# Patient Record
Sex: Female | Born: 1945
Health system: Southern US, Community
[De-identification: ages and names within clinical notes are randomized; demographics above are authoritative.]

## PROBLEM LIST (undated history)

## (undated) DIAGNOSIS — F411 Generalized anxiety disorder: Secondary | ICD-10-CM

## (undated) DIAGNOSIS — R55 Syncope and collapse: Secondary | ICD-10-CM

## (undated) DIAGNOSIS — E663 Overweight: Secondary | ICD-10-CM

## (undated) DIAGNOSIS — N951 Menopausal and female climacteric states: Secondary | ICD-10-CM

## (undated) DIAGNOSIS — I447 Left bundle-branch block, unspecified: Secondary | ICD-10-CM

## (undated) DIAGNOSIS — E785 Hyperlipidemia, unspecified: Secondary | ICD-10-CM

## (undated) DIAGNOSIS — D126 Benign neoplasm of colon, unspecified: Secondary | ICD-10-CM

## (undated) DIAGNOSIS — Z950 Presence of cardiac pacemaker: Secondary | ICD-10-CM

## (undated) DIAGNOSIS — M1990 Unspecified osteoarthritis, unspecified site: Secondary | ICD-10-CM

## (undated) DIAGNOSIS — G4700 Insomnia, unspecified: Secondary | ICD-10-CM

## (undated) DIAGNOSIS — G20 Parkinson's disease: Secondary | ICD-10-CM

## (undated) DIAGNOSIS — I10 Essential (primary) hypertension: Secondary | ICD-10-CM

## (undated) DIAGNOSIS — G20A1 Parkinson's disease without dyskinesia, without mention of fluctuations: Secondary | ICD-10-CM

## (undated) HISTORY — DX: Generalized anxiety disorder: F41.1

## (undated) HISTORY — DX: Syncope and collapse: R55

## (undated) HISTORY — DX: Left bundle-branch block, unspecified: I44.7

## (undated) HISTORY — DX: Essential (primary) hypertension: I10

## (undated) HISTORY — DX: Parkinson's disease without dyskinesia, without mention of fluctuations: G20.A1

## (undated) HISTORY — PX: POLYPECTOMY: SHX149

## (undated) HISTORY — DX: Insomnia, unspecified: G47.00

## (undated) HISTORY — DX: Hyperlipidemia, unspecified: E78.5

## (undated) HISTORY — DX: Parkinson's disease: G20

## (undated) HISTORY — DX: Benign neoplasm of colon, unspecified: D12.6

## (undated) HISTORY — DX: Menopausal and female climacteric states: N95.1

## (undated) HISTORY — DX: Unspecified osteoarthritis, unspecified site: M19.90

## (undated) HISTORY — DX: Overweight: E66.3

---

## 1998-02-19 ENCOUNTER — Ambulatory Visit (HOSPITAL_COMMUNITY): Admission: RE | Admit: 1998-02-19 | Discharge: 1998-02-19 | Payer: Self-pay | Admitting: Obstetrics and Gynecology

## 1998-02-19 ENCOUNTER — Encounter: Payer: Self-pay | Admitting: Obstetrics and Gynecology

## 1998-07-10 ENCOUNTER — Other Ambulatory Visit: Admission: RE | Admit: 1998-07-10 | Discharge: 1998-07-10 | Payer: Self-pay | Admitting: Obstetrics and Gynecology

## 1999-03-06 ENCOUNTER — Encounter: Payer: Self-pay | Admitting: Obstetrics and Gynecology

## 1999-03-06 ENCOUNTER — Ambulatory Visit (HOSPITAL_COMMUNITY): Admission: RE | Admit: 1999-03-06 | Discharge: 1999-03-06 | Payer: Self-pay | Admitting: Obstetrics and Gynecology

## 1999-07-11 ENCOUNTER — Other Ambulatory Visit: Admission: RE | Admit: 1999-07-11 | Discharge: 1999-07-11 | Payer: Self-pay | Admitting: Obstetrics and Gynecology

## 1999-10-23 ENCOUNTER — Encounter (INDEPENDENT_AMBULATORY_CARE_PROVIDER_SITE_OTHER): Payer: Self-pay | Admitting: Specialist

## 1999-10-23 ENCOUNTER — Other Ambulatory Visit: Admission: RE | Admit: 1999-10-23 | Discharge: 1999-10-23 | Payer: Self-pay | Admitting: *Deleted

## 1999-12-05 ENCOUNTER — Ambulatory Visit (HOSPITAL_COMMUNITY): Admission: RE | Admit: 1999-12-05 | Discharge: 1999-12-05 | Payer: Self-pay | Admitting: Obstetrics and Gynecology

## 1999-12-05 ENCOUNTER — Encounter (INDEPENDENT_AMBULATORY_CARE_PROVIDER_SITE_OTHER): Payer: Self-pay | Admitting: Specialist

## 2000-04-13 ENCOUNTER — Encounter: Payer: Self-pay | Admitting: Obstetrics and Gynecology

## 2000-04-13 ENCOUNTER — Ambulatory Visit (HOSPITAL_COMMUNITY): Admission: RE | Admit: 2000-04-13 | Discharge: 2000-04-13 | Payer: Self-pay | Admitting: Obstetrics and Gynecology

## 2000-06-07 ENCOUNTER — Ambulatory Visit (HOSPITAL_COMMUNITY): Admission: RE | Admit: 2000-06-07 | Discharge: 2000-06-07 | Payer: Self-pay | Admitting: Cardiology

## 2000-07-15 ENCOUNTER — Other Ambulatory Visit: Admission: RE | Admit: 2000-07-15 | Discharge: 2000-07-15 | Payer: Self-pay | Admitting: Obstetrics and Gynecology

## 2001-07-20 ENCOUNTER — Other Ambulatory Visit: Admission: RE | Admit: 2001-07-20 | Discharge: 2001-07-20 | Payer: Self-pay | Admitting: Obstetrics and Gynecology

## 2001-08-02 ENCOUNTER — Encounter: Payer: Self-pay | Admitting: Internal Medicine

## 2001-08-02 ENCOUNTER — Ambulatory Visit (HOSPITAL_COMMUNITY): Admission: RE | Admit: 2001-08-02 | Discharge: 2001-08-02 | Payer: Self-pay | Admitting: Internal Medicine

## 2002-07-04 ENCOUNTER — Other Ambulatory Visit: Admission: RE | Admit: 2002-07-04 | Discharge: 2002-07-04 | Payer: Self-pay | Admitting: Obstetrics and Gynecology

## 2002-08-23 ENCOUNTER — Ambulatory Visit (HOSPITAL_COMMUNITY): Admission: RE | Admit: 2002-08-23 | Discharge: 2002-08-23 | Payer: Self-pay | Admitting: Obstetrics and Gynecology

## 2002-08-23 ENCOUNTER — Encounter: Payer: Self-pay | Admitting: Obstetrics and Gynecology

## 2003-02-27 ENCOUNTER — Other Ambulatory Visit: Admission: RE | Admit: 2003-02-27 | Discharge: 2003-02-27 | Payer: Self-pay | Admitting: Family Medicine

## 2003-07-11 ENCOUNTER — Other Ambulatory Visit: Admission: RE | Admit: 2003-07-11 | Discharge: 2003-07-11 | Payer: Self-pay | Admitting: Obstetrics and Gynecology

## 2003-08-21 ENCOUNTER — Ambulatory Visit (HOSPITAL_COMMUNITY): Admission: RE | Admit: 2003-08-21 | Discharge: 2003-08-21 | Payer: Self-pay | Admitting: Obstetrics and Gynecology

## 2004-08-26 ENCOUNTER — Ambulatory Visit (HOSPITAL_COMMUNITY): Admission: RE | Admit: 2004-08-26 | Discharge: 2004-08-26 | Payer: Self-pay | Admitting: Obstetrics and Gynecology

## 2004-09-12 ENCOUNTER — Encounter: Payer: Self-pay | Admitting: Internal Medicine

## 2004-09-12 LAB — CONVERTED CEMR LAB

## 2005-03-16 DIAGNOSIS — D126 Benign neoplasm of colon, unspecified: Secondary | ICD-10-CM

## 2005-03-16 HISTORY — DX: Benign neoplasm of colon, unspecified: D12.6

## 2005-03-16 HISTORY — PX: COLONOSCOPY: SHX174

## 2005-07-22 ENCOUNTER — Ambulatory Visit: Payer: Self-pay | Admitting: Internal Medicine

## 2005-08-31 ENCOUNTER — Ambulatory Visit (HOSPITAL_COMMUNITY): Admission: RE | Admit: 2005-08-31 | Discharge: 2005-08-31 | Payer: Self-pay | Admitting: Internal Medicine

## 2005-09-10 ENCOUNTER — Ambulatory Visit: Payer: Self-pay | Admitting: Internal Medicine

## 2005-09-10 ENCOUNTER — Ambulatory Visit (HOSPITAL_COMMUNITY): Admission: RE | Admit: 2005-09-10 | Discharge: 2005-09-10 | Payer: Self-pay | Admitting: Internal Medicine

## 2005-09-11 ENCOUNTER — Encounter: Admission: RE | Admit: 2005-09-11 | Discharge: 2005-09-11 | Payer: Self-pay | Admitting: Internal Medicine

## 2005-09-21 ENCOUNTER — Ambulatory Visit: Payer: Self-pay | Admitting: Internal Medicine

## 2005-10-07 ENCOUNTER — Ambulatory Visit: Payer: Self-pay | Admitting: Internal Medicine

## 2005-11-06 ENCOUNTER — Ambulatory Visit: Payer: Self-pay | Admitting: Internal Medicine

## 2005-11-20 ENCOUNTER — Ambulatory Visit: Payer: Self-pay | Admitting: Gastroenterology

## 2005-12-04 ENCOUNTER — Ambulatory Visit: Payer: Self-pay | Admitting: Internal Medicine

## 2006-01-01 ENCOUNTER — Encounter (INDEPENDENT_AMBULATORY_CARE_PROVIDER_SITE_OTHER): Payer: Self-pay | Admitting: *Deleted

## 2006-01-01 ENCOUNTER — Ambulatory Visit: Payer: Self-pay | Admitting: Gastroenterology

## 2006-01-29 ENCOUNTER — Ambulatory Visit: Payer: Self-pay | Admitting: Internal Medicine

## 2006-04-30 ENCOUNTER — Ambulatory Visit: Payer: Self-pay | Admitting: Internal Medicine

## 2006-05-11 ENCOUNTER — Ambulatory Visit: Payer: Self-pay

## 2006-09-01 ENCOUNTER — Ambulatory Visit: Payer: Self-pay | Admitting: Internal Medicine

## 2006-09-01 LAB — CONVERTED CEMR LAB
Folate: 16.8 ng/mL
Vitamin B-12: 442 pg/mL (ref 211–911)

## 2006-09-03 ENCOUNTER — Ambulatory Visit: Payer: Self-pay | Admitting: Internal Medicine

## 2006-10-12 ENCOUNTER — Encounter: Payer: Self-pay | Admitting: Internal Medicine

## 2006-10-12 DIAGNOSIS — F329 Major depressive disorder, single episode, unspecified: Secondary | ICD-10-CM | POA: Insufficient documentation

## 2006-10-12 DIAGNOSIS — F419 Anxiety disorder, unspecified: Secondary | ICD-10-CM | POA: Insufficient documentation

## 2006-10-12 DIAGNOSIS — M1990 Unspecified osteoarthritis, unspecified site: Secondary | ICD-10-CM | POA: Insufficient documentation

## 2007-01-05 ENCOUNTER — Telehealth: Payer: Self-pay | Admitting: Internal Medicine

## 2007-01-14 ENCOUNTER — Encounter: Payer: Self-pay | Admitting: Internal Medicine

## 2007-01-14 ENCOUNTER — Ambulatory Visit (HOSPITAL_COMMUNITY): Admission: RE | Admit: 2007-01-14 | Discharge: 2007-01-14 | Payer: Self-pay | Admitting: Internal Medicine

## 2007-01-28 ENCOUNTER — Ambulatory Visit: Payer: Self-pay | Admitting: Internal Medicine

## 2007-02-01 LAB — CONVERTED CEMR LAB
AST: 28 units/L (ref 0–37)
Albumin: 3.6 g/dL (ref 3.5–5.2)
Bilirubin Urine: NEGATIVE
Crystals: NEGATIVE
Eosinophils Relative: 1.9 % (ref 0.0–5.0)
GFR calc Af Amer: 109 mL/min
GFR calc non Af Amer: 90 mL/min
HCT: 41.1 % (ref 36.0–46.0)
HDL: 115.3 mg/dL (ref >39.0)
Hemoglobin: 14.1 g/dL (ref 12.0–15.0)
Lymphocytes Relative: 32.5 % (ref 12.0–46.0)
Monocytes Relative: 6.8 % (ref 3.0–11.0)
Potassium: 3.8 meq/L (ref 3.5–5.1)
RBC: 4.77 M/uL (ref 3.87–5.11)
Specific Gravity, Urine: 1.015 (ref 1.000–1.03)
Total Bilirubin: 0.8 mg/dL (ref 0.3–1.2)
Total Protein: 7 g/dL (ref 6.0–8.3)
Triglycerides: 43 mg/dL (ref 0–149)
Urine Glucose: NEGATIVE mg/dL
Urobilinogen, UA: 0.2 (ref 0.0–1.0)

## 2007-02-04 ENCOUNTER — Ambulatory Visit: Payer: Self-pay | Admitting: Internal Medicine

## 2007-02-04 DIAGNOSIS — M899 Disorder of bone, unspecified: Secondary | ICD-10-CM

## 2007-02-04 DIAGNOSIS — M949 Disorder of cartilage, unspecified: Secondary | ICD-10-CM

## 2007-02-04 DIAGNOSIS — N951 Menopausal and female climacteric states: Secondary | ICD-10-CM | POA: Insufficient documentation

## 2007-02-04 DIAGNOSIS — E785 Hyperlipidemia, unspecified: Secondary | ICD-10-CM | POA: Insufficient documentation

## 2007-02-07 ENCOUNTER — Encounter: Payer: Self-pay | Admitting: Internal Medicine

## 2007-02-07 LAB — CONVERTED CEMR LAB: CRP, High Sensitivity: 5 (ref 0.00–5.00)

## 2007-03-22 ENCOUNTER — Ambulatory Visit: Payer: Self-pay | Admitting: Internal Medicine

## 2007-03-23 LAB — CONVERTED CEMR LAB
Direct LDL: 69.3 mg/dL
HDL: 119.4 mg/dL (ref >39.0)
Total CHOL/HDL Ratio: 1.8
Total CHOL/HDL Ratio: 1.8
Triglycerides: 49 mg/dL (ref 0–149)
VLDL: 10 mg/dL (ref 0–40)
VLDL: 10 mg/dL (ref 0–40)

## 2007-03-30 ENCOUNTER — Ambulatory Visit: Payer: Self-pay | Admitting: Internal Medicine

## 2007-04-13 ENCOUNTER — Encounter: Payer: Self-pay | Admitting: Internal Medicine

## 2007-07-11 ENCOUNTER — Encounter (INDEPENDENT_AMBULATORY_CARE_PROVIDER_SITE_OTHER): Payer: Self-pay | Admitting: *Deleted

## 2007-09-05 ENCOUNTER — Encounter: Payer: Self-pay | Admitting: Internal Medicine

## 2007-09-13 ENCOUNTER — Telehealth (INDEPENDENT_AMBULATORY_CARE_PROVIDER_SITE_OTHER): Payer: Self-pay | Admitting: *Deleted

## 2007-10-12 ENCOUNTER — Ambulatory Visit: Payer: Self-pay | Admitting: Internal Medicine

## 2007-11-22 ENCOUNTER — Telehealth: Payer: Self-pay | Admitting: Internal Medicine

## 2007-11-30 ENCOUNTER — Ambulatory Visit: Payer: Self-pay | Admitting: Psychology

## 2007-12-12 ENCOUNTER — Telehealth: Payer: Self-pay | Admitting: Internal Medicine

## 2007-12-16 ENCOUNTER — Ambulatory Visit: Payer: Self-pay | Admitting: Psychology

## 2007-12-19 ENCOUNTER — Telehealth: Payer: Self-pay | Admitting: Internal Medicine

## 2007-12-27 ENCOUNTER — Ambulatory Visit: Payer: Self-pay | Admitting: Psychology

## 2007-12-30 ENCOUNTER — Ambulatory Visit: Payer: Self-pay | Admitting: Internal Medicine

## 2007-12-30 LAB — CONVERTED CEMR LAB
AST: 24 units/L (ref 0–37)
Albumin: 3.5 g/dL (ref 3.5–5.2)
Alkaline Phosphatase: 65 units/L (ref 39–117)
TSH: 1.85 microintl units/mL (ref 0.35–5.50)
Total Bilirubin: 0.7 mg/dL (ref 0.3–1.2)
Total CHOL/HDL Ratio: 1.8
VLDL: 7 mg/dL (ref 0–40)

## 2008-01-12 ENCOUNTER — Ambulatory Visit: Payer: Self-pay | Admitting: Internal Medicine

## 2008-01-12 DIAGNOSIS — K620 Anal polyp: Secondary | ICD-10-CM | POA: Insufficient documentation

## 2008-01-12 DIAGNOSIS — G4700 Insomnia, unspecified: Secondary | ICD-10-CM | POA: Insufficient documentation

## 2008-01-12 DIAGNOSIS — K621 Rectal polyp: Secondary | ICD-10-CM

## 2008-01-24 ENCOUNTER — Telehealth: Payer: Self-pay | Admitting: Gastroenterology

## 2008-01-27 ENCOUNTER — Ambulatory Visit: Payer: Self-pay | Admitting: Internal Medicine

## 2008-01-27 ENCOUNTER — Ambulatory Visit: Payer: Self-pay | Admitting: Psychology

## 2008-01-27 ENCOUNTER — Encounter: Payer: Self-pay | Admitting: Internal Medicine

## 2008-02-20 ENCOUNTER — Telehealth: Payer: Self-pay | Admitting: Internal Medicine

## 2008-02-24 ENCOUNTER — Ambulatory Visit (HOSPITAL_COMMUNITY): Admission: RE | Admit: 2008-02-24 | Discharge: 2008-02-24 | Payer: Self-pay | Admitting: Internal Medicine

## 2008-02-24 ENCOUNTER — Ambulatory Visit: Payer: Self-pay | Admitting: Psychology

## 2008-02-24 IMAGING — MG MM DIGITAL SCREENING BILAT
5 series · 5 of 5 positions shown · non-contrast
Comparison: none

DG SCREEN MAMMOGRAM BILATERAL
Bilateral CC and MLO view(s) were taken.

DIGITAL SCREENING MAMMOGRAM WITH CAD:
The breast tissue is heterogeneously dense.  No masses or malignant type calcifications are 
identified.  Compared with prior studies.

[R CC]
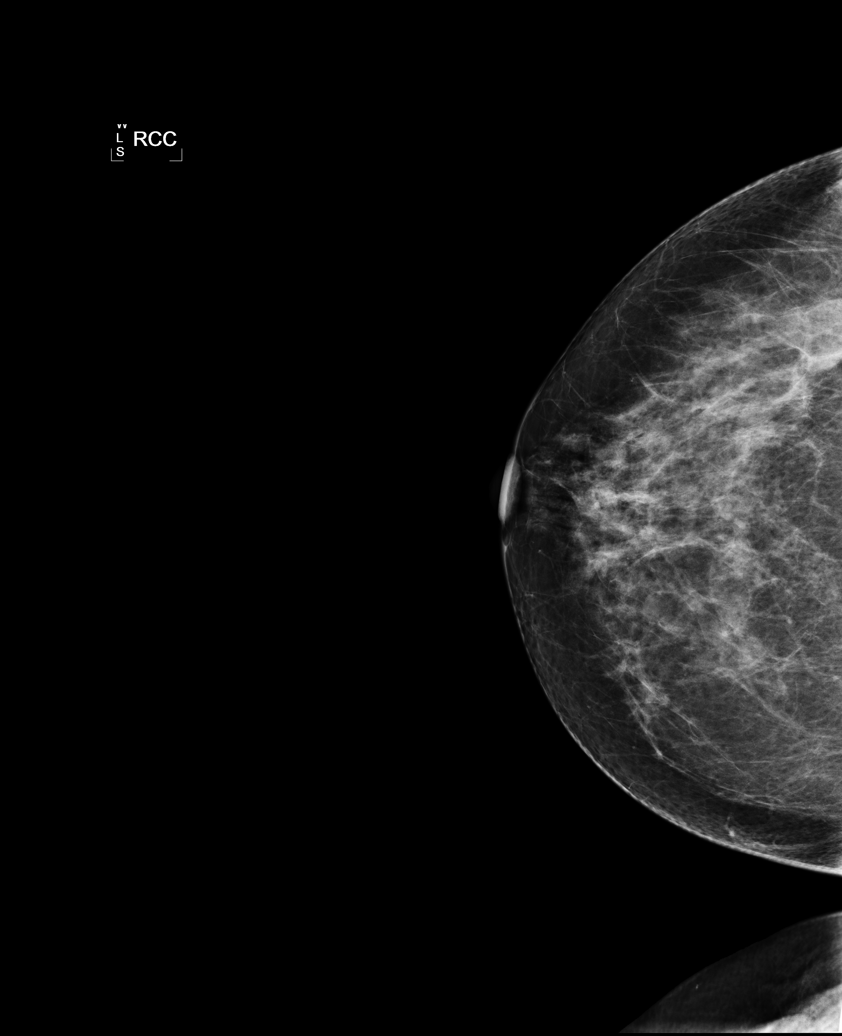

[R MLO (1 of 2)]
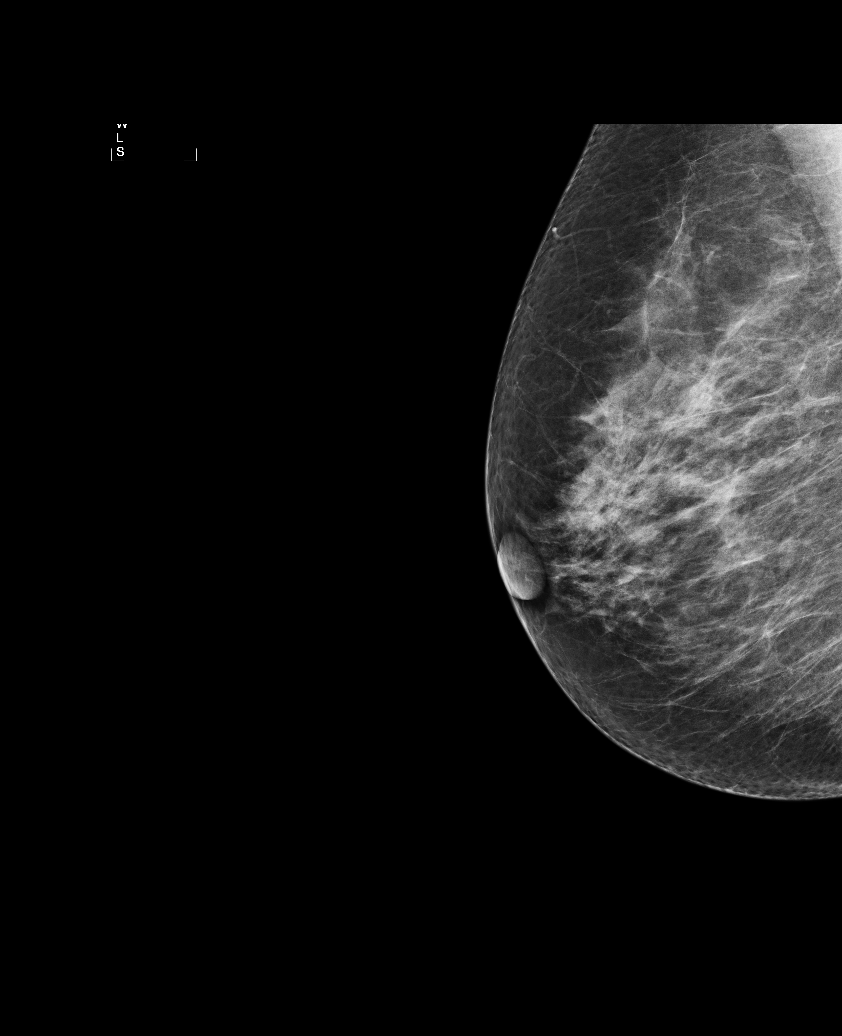

[L CC]
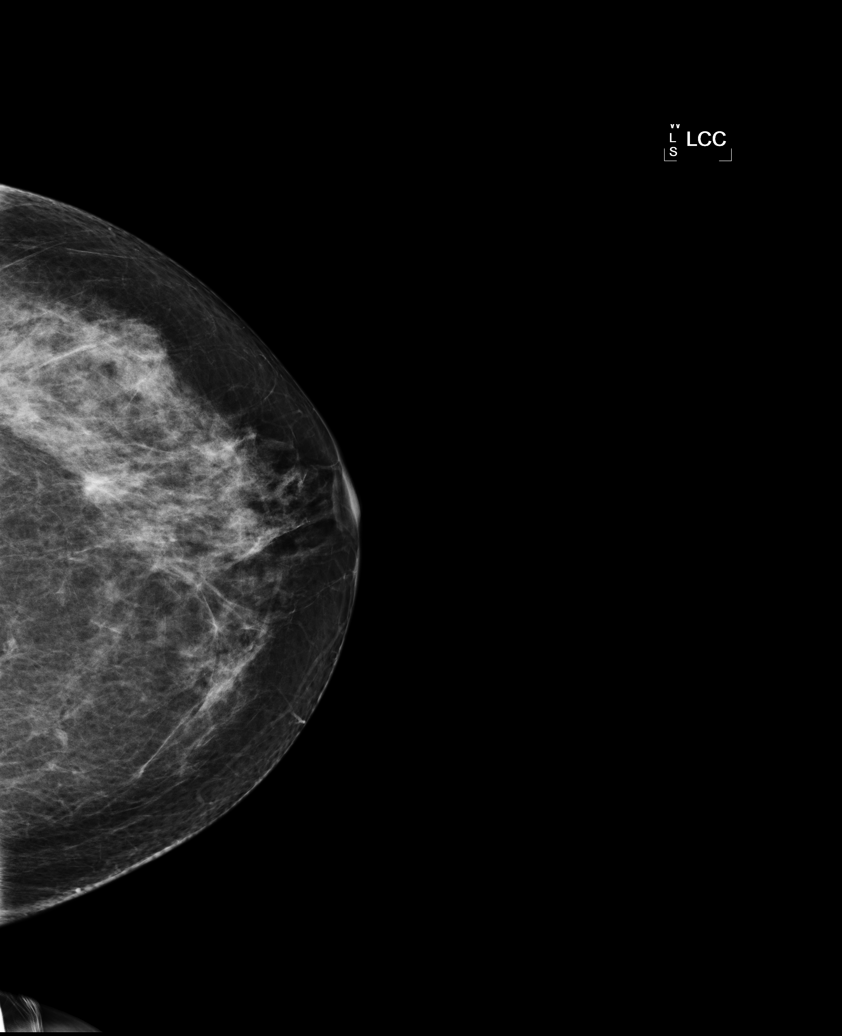

[L MLO]
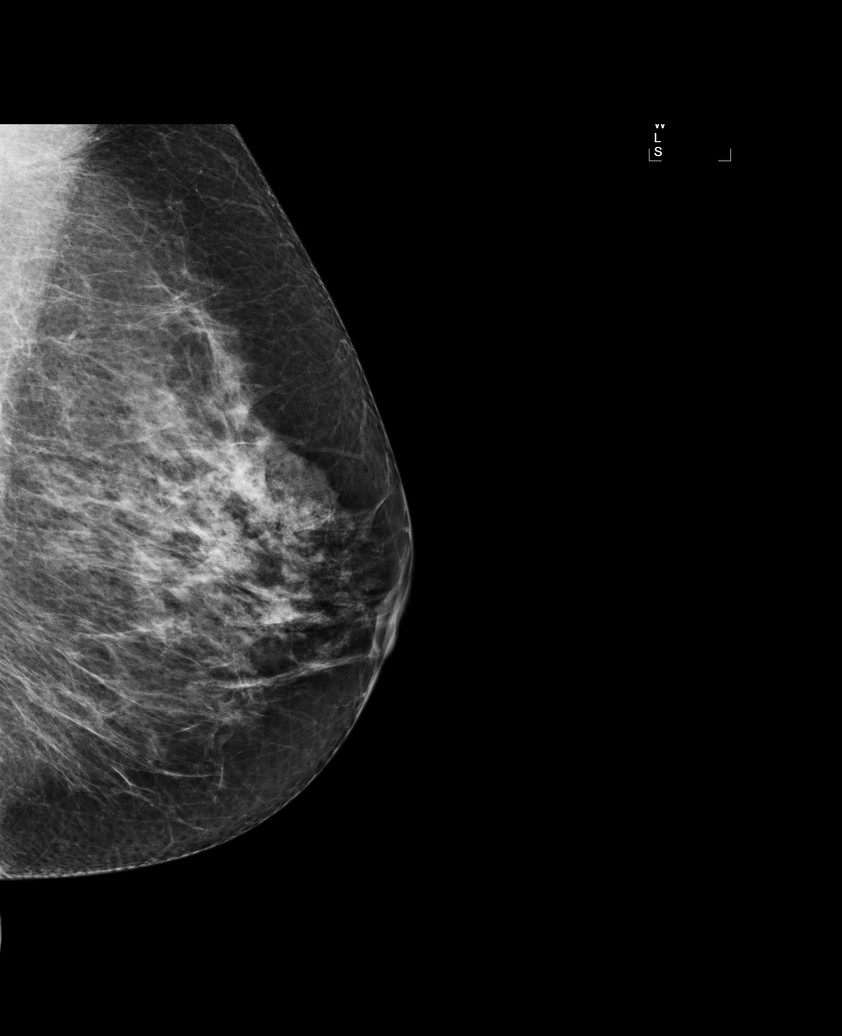

[R MLO (2 of 2)]
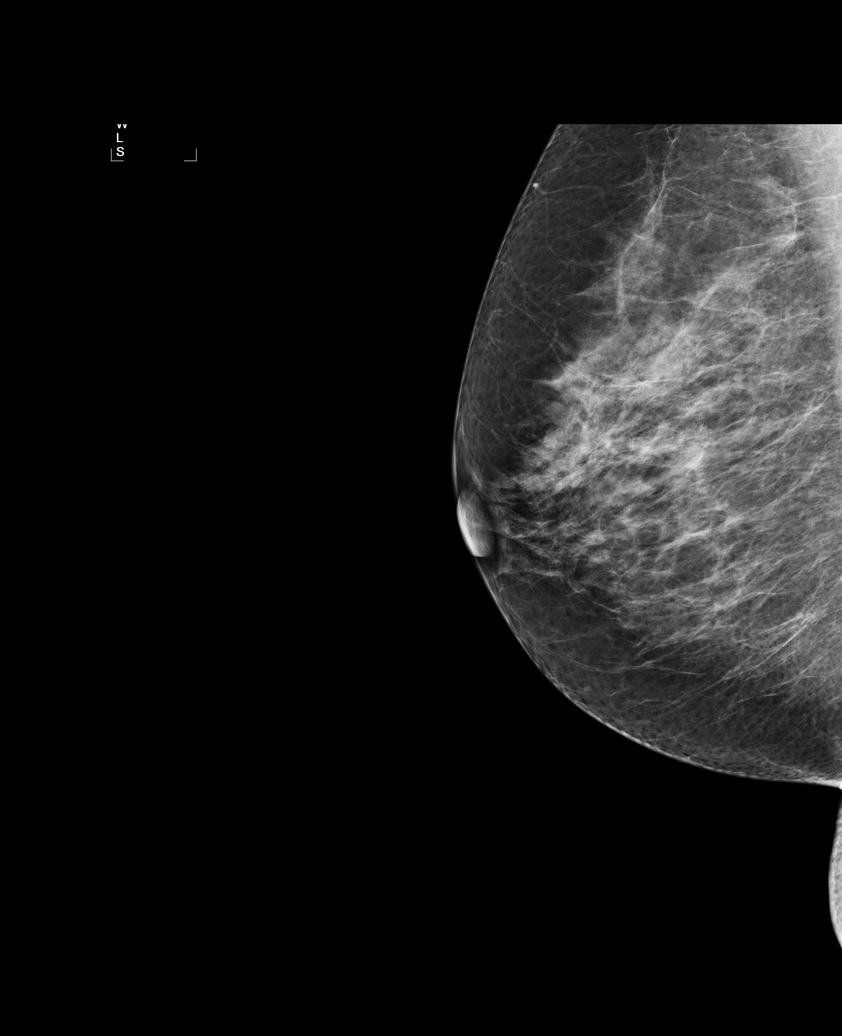

[5 of 5 positions shown; findings below may reference images not displayed]

IMPRESSION: No specific mammographic evidence of malignancy.  Next screening mammogram is recommended in one 
year.

ASSESSMENT: Negative - BI-RADS 1

Screening mammogram in 1 year.
ANALYZED BY COMPUTER AIDED DETECTION. , THIS PROCEDURE WAS A DIGITAL MAMMOGRAM.

## 2008-03-23 ENCOUNTER — Ambulatory Visit: Payer: Self-pay | Admitting: Psychology

## 2008-04-06 ENCOUNTER — Ambulatory Visit: Payer: Self-pay | Admitting: Psychology

## 2008-05-04 ENCOUNTER — Ambulatory Visit: Payer: Self-pay | Admitting: Psychology

## 2008-05-07 ENCOUNTER — Telehealth: Payer: Self-pay | Admitting: Internal Medicine

## 2008-05-09 ENCOUNTER — Telehealth: Payer: Self-pay | Admitting: Internal Medicine

## 2008-06-01 ENCOUNTER — Ambulatory Visit: Payer: Self-pay | Admitting: Psychology

## 2008-07-12 ENCOUNTER — Encounter: Payer: Self-pay | Admitting: Internal Medicine

## 2008-10-12 ENCOUNTER — Ambulatory Visit: Payer: Self-pay | Admitting: Psychology

## 2008-11-16 ENCOUNTER — Ambulatory Visit: Payer: Self-pay | Admitting: Psychology

## 2008-11-26 ENCOUNTER — Telehealth: Payer: Self-pay | Admitting: Internal Medicine

## 2008-12-11 ENCOUNTER — Encounter: Payer: Self-pay | Admitting: Internal Medicine

## 2008-12-14 ENCOUNTER — Ambulatory Visit: Payer: Self-pay | Admitting: Psychology

## 2009-04-11 ENCOUNTER — Ambulatory Visit (HOSPITAL_COMMUNITY): Admission: RE | Admit: 2009-04-11 | Discharge: 2009-04-11 | Payer: Self-pay | Admitting: Internal Medicine

## 2009-04-17 ENCOUNTER — Encounter: Admission: RE | Admit: 2009-04-17 | Discharge: 2009-04-17 | Payer: Self-pay | Admitting: Internal Medicine

## 2009-04-17 LAB — HM MAMMOGRAPHY: HM Mammogram: NEGATIVE

## 2009-04-18 ENCOUNTER — Encounter: Payer: Self-pay | Admitting: Internal Medicine

## 2009-07-11 ENCOUNTER — Ambulatory Visit: Payer: Self-pay | Admitting: Internal Medicine

## 2009-07-11 LAB — CONVERTED CEMR LAB
ALT: 17 units/L (ref 0–35)
Albumin: 3.5 g/dL (ref 3.5–5.2)
BUN: 12 mg/dL (ref 6–23)
Bilirubin, Direct: 0.1 mg/dL (ref 0.0–0.3)
CO2: 30 meq/L (ref 19–32)
Calcium: 8.9 mg/dL (ref 8.4–10.5)
Cholesterol: 293 mg/dL — ABNORMAL HIGH (ref 0–200)
Eosinophils Absolute: 0.2 10*3/uL (ref 0.0–0.7)
GFR calc non Af Amer: 89.32 mL/min (ref >60)
HCT: 39.1 % (ref 36.0–46.0)
HDL: 107 mg/dL (ref >39.00)
Lymphs Abs: 1.5 10*3/uL (ref 0.7–4.0)
Monocytes Relative: 7.9 % (ref 3.0–12.0)
Neutro Abs: 2.6 10*3/uL (ref 1.4–7.7)
Neutrophils Relative %: 55.4 % (ref 43.0–77.0)
Potassium: 4.4 meq/L (ref 3.5–5.1)
RBC: 4.47 M/uL (ref 3.87–5.11)
Total Bilirubin: 0.6 mg/dL (ref 0.3–1.2)
VLDL: 11.6 mg/dL (ref 0.0–40.0)
WBC: 4.6 10*3/uL (ref 4.5–10.5)

## 2009-07-18 ENCOUNTER — Encounter (INDEPENDENT_AMBULATORY_CARE_PROVIDER_SITE_OTHER): Payer: Self-pay | Admitting: *Deleted

## 2009-07-18 ENCOUNTER — Ambulatory Visit: Payer: Self-pay | Admitting: Internal Medicine

## 2009-08-19 ENCOUNTER — Telehealth: Payer: Self-pay | Admitting: Internal Medicine

## 2009-09-27 ENCOUNTER — Encounter (INDEPENDENT_AMBULATORY_CARE_PROVIDER_SITE_OTHER): Payer: Self-pay | Admitting: *Deleted

## 2009-12-24 ENCOUNTER — Telehealth: Payer: Self-pay | Admitting: Internal Medicine

## 2010-01-21 ENCOUNTER — Telehealth: Payer: Self-pay | Admitting: Internal Medicine

## 2010-02-14 ENCOUNTER — Encounter: Payer: Self-pay | Admitting: Internal Medicine

## 2010-04-03 ENCOUNTER — Other Ambulatory Visit: Payer: Self-pay | Admitting: Internal Medicine

## 2010-04-03 DIAGNOSIS — Z1231 Encounter for screening mammogram for malignant neoplasm of breast: Secondary | ICD-10-CM

## 2010-04-03 DIAGNOSIS — Z0000 Encounter for general adult medical examination without abnormal findings: Secondary | ICD-10-CM

## 2010-04-06 ENCOUNTER — Encounter: Payer: Self-pay | Admitting: Obstetrics and Gynecology

## 2010-04-15 NOTE — Progress Notes (Signed)
Summary: REQ INFO FROM [facility]   Phone Note Call from Patient Call back at Work Phone [phone]   Summary of Call: Patient is requesting information from last office visit in may for insurance paperwork.  Initial call taken by: Lamar Sprinkles, CMA,  December 24, 2009 11:44 AM  Follow-up for Phone Call        Gave pt verbal info - vitals from last office visit given Follow-up by: Lamar Sprinkles, CMA,  December 25, 2009 2:07 PM

## 2010-04-15 NOTE — Letter (Signed)
Summary: [facility] Referral (unable to schedule) Notification  [facility]  [address] [place], [place] [address]   Phone: [phone]  Fax: [phone]      September 27, 2009 Mandy Durham 02-07-1946 MRN: [id number]   Mandy Durham [address] [place], [place]  [address]   Dear Dr. Debby Bud:   Thank you for your kind referral of the above patient. We have attempted to schedule the recommended COLONOSCOPY but have been unable to schedule because:  _X_ The patient was not available by phone and/or has not returned our calls.  __ The patient declined to schedule the procedure at this time.  We appreciate the referral and hope that we will have the opportunity to treat this patient in the future.    Sincerely,   [facility]  Vania Rea. Jarold Motto M.D. Hedwig Morton. Juanda Chance M.D. Venita Lick. Russella Dar M.D. Wilhemina Bonito. Marina Goodell M.D. Barbette Hair. Arlyce Dice M.D. Iva Boop M.D. Cheron Every.D.

## 2010-04-15 NOTE — Progress Notes (Signed)
  Phone Note Refill Request Message from:  Fax from Pharmacy on January 21, 2010 8:22 AM  Refills Requested: Medication #1:  ZOLPIDEM TARTRATE 10 MG  TABS Take 1/2  tab by mouth at bedtime Please Advise refills  Initial call taken by: Ami Bullins CMA,  January 21, 2010 8:22 AM  Follow-up for Phone Call        k x 5 Follow-up by: Jacques Navy MD,  January 21, 2010 1:17 PM    Prescriptions: ZOLPIDEM TARTRATE 10 MG  TABS (ZOLPIDEM TARTRATE) Take 1/2  tab by mouth at bedtime  #15 x 5   Entered by:   Ami Bullins CMA   Authorized by:   Jacques Navy MD   Signed by:   Bill Salinas CMA on 01/21/2010   Method used:   Telephoned to ...       [facility]  [address]* (retail)       [address]       [place], [place]  [address]       Ph: [phone] or [phone]       Fax: [phone]   RxID:   [id number]

## 2010-04-15 NOTE — Letter (Signed)
Summary: [facility] Consult Scheduled Letter  [facility]  [address] [place], [place] [address]   Phone: [phone]  Fax: [phone]      07/18/2009 MRN: [id number]  [facility] [address] [place], [place]  [address]    Dear Ms. Andrey Campanile,      We have scheduled an appointment for you.  At the recommendation of Dr.Norins, we have scheduled you a consult with [facility](2D-ECHO)on May 26.2011 TUE at 11:30am.Their phone number is [phone].If this appointment day and time is not convenient for you, please feel free to call the office of the doctor you are being referred to at the number listed above and reschedule the appointment.  [facility] [address] [place] ,[place] [address]    Thank you,  Patient Care Coordinator [facility]

## 2010-04-15 NOTE — Progress Notes (Signed)
Summary: REQUEST FOR REFILL   Phone Note Refill Request Message from:  Pharmacy  Refills Requested: Medication #1:  ZOLPIDEM TARTRATE 10 MG  TABS Take 1/2  tab by mouth at bedtime Initial call taken by: Lamar Sprinkles, CMA,  Jecenia  6, 2011 11:04 AM  Follow-up for Phone Call        see phone note 6/3 Follow-up by: Etta Grandchild MD,  Danasha  6, 2011 12:49 PM  Additional Follow-up for Phone Call Additional follow up Details #1::        No phone note, only refill request from pharm, pt had office visit 07/2009, CPX. Is this ok to fill?  Additional Follow-up by: Lamar Sprinkles, CMA,  Chaela  6, 2011 1:36 PM    Additional Follow-up for Phone Call Additional follow up Details #2::    yes Follow-up by: Etta Grandchild MD,  Ikran  6, 2011 5:13 PM  Prescriptions: ZOLPIDEM TARTRATE 10 MG  TABS (ZOLPIDEM TARTRATE) Take 1/2  tab by mouth at bedtime  #15 x 5   Entered by:   Lamar Sprinkles, CMA   Authorized by:   Jacques Navy MD   Signed by:   Lamar Sprinkles, CMA on 08/19/2009   Method used:   Telephoned to ...       [facility]  [address]* (retail)       [address]       [place], [place]  [address]       Ph: [phone] or [phone]       Fax: [phone]   RxID:   [id number]

## 2010-04-15 NOTE — Assessment & Plan Note (Signed)
Summary: CPX/ MED REFILL/NWS  #   Vital Signs:  Patient profile:   65 year old female Height:      65.5 inches Weight:      147 pounds BMI:     24.18 O2 Sat:      94 % on Room air Temp:     97.6 degrees F oral Pulse rate:   83 / minute BP sitting:   118 / 76  (left arm) Cuff size:   regular  Vitals Entered By: Bill Salinas CMA (Jul 18, 2009 1:27 PM)  O2 Flow:  Room air CC: pt here for yearly cpx, pt needs refills on citalopram, zolpidem, provera, estradial/ ab  Vision Screening:      Vision Comments: Last eye exam 2008 with Dr Almon Register. No changes   Primary Care Provider:  Gionna Polak  CC:  pt here for yearly cpx, pt needs refills on citalopram, zolpidem, provera, and estradial/ ab.  History of Present Illness: The patient reports a change in Bowel Habit from a daily habit to an irregular habit, sometimes going 2-3 days without a BM. In addition her she reports that the stool is more odfieroius. She cannot tell if the appearance has changed or if there is increased fat content.   She has started a once a day vitamin due to fatigue and malaise. Reviewed labs: no metabolic derangment, thyroid normal.   Lipids - reviewed chart back to '08 and the intiation of medical therapy after an episode of chest pain in Nov '08. She had a good response to crestor and then switched, for cost reasons, to lovastatin 40mg  qPM. For some time she has inadvertantly been off medication.   She has noticed increased to shortness of breath with exertion over the past 4 month. She has had no other symptoms - no chest pain. she has not gained a lot of weight or really changed her level of activity. Cardiac Risk - post-menopausal, age, hyperlipidemia.   She will have leg pain with walking:pain will start within 5-10 minutes. Pain will resolve quickly with rest. She denies a sense of leaden legs. No skin breakdown. No risk factors: non-smoker, not obese, no prolonged periods of inactivity.   She has seen her  gynecologist - normal examination. She has had a normal mammogram.   Current Medications (verified): 1)  Citalopram Hydrobromide 20 Mg  Tabs (Citalopram Hydrobromide) .... Take 1 Tablet By Mouth Once A Day 2)  Zolpidem Tartrate 10 Mg  Tabs (Zolpidem Tartrate) .... Take 1/2  Tab By Mouth At Bedtime 3)  Caltrate 600+d 600-400 Mg-Unit  Tabs (Calcium Carbonate-Vitamin D) .... Take 1 Tablet By Mouth Three Times A Day 4)  Lovastatin 20 Mg  Tabs (Lovastatin) .... 2 Tabs Q Pm For Cholesterol 5)  Provera 2.5 Mg Tabs (Medroxyprogesterone Acetate) .... Take 1 Tablet By Mouth Once A Day 6)  Estradial .... Take 1 Tablet By Mouth Once A Day  Allergies (verified): No Known Drug Allergies  Past History:  Past Medical History: Last updated: 01/12/2008 INSOMNIA, CHRONIC, MILD (ICD-307.42) COLONIC POLYPS, ADENOMATOUS, BENIGN (ICD-569.0) OVERWEIGHT (ICD-278.02) HOT FLASHES (ICD-627.2) OSTEOPENIA (ICD-733.90) DYSLIPIDEMIA (ICD-272.4) OSTEOARTHRITIS (ICD-715.90) DEPRESSION (ICD-311) ANXIETY (ICD-300.00)   Physician Roster:               Gyn -   Dr. Billy Coast              GI    -   Dr. Russella Dar  Optometery - Dr. Sherryle Lis  Past Surgical History: Last updated: 01/12/2008 Child Birth Tubal ligation  Family History: Last updated: 10-16-07 father - deceased @ 90+: CHF, CAD, demntia mother - deceased @ ?: no cause listed Neg - breast cancer, colon cancer  Social History: Last updated: 01/12/2008 HSG married '65 2 daughters - ''83, '77; 4 grandchildren work: accounts Lobbyist.  Family History: Reviewed history from 10/16/07 and no changes required. father - deceased @ 90+: CHF, CAD, demntia mother - deceased @ ?: no cause listed Neg - breast cancer, colon cancer  Social History: Reviewed history from 01/12/2008 and no changes required. HSG married '65 2 daughters - ''63, '77; 4 grandchildren work: accounts Lobbyist.  Review of Systems       The patient  complains of weight gain.  The patient denies anorexia, fever, chest pain, dyspnea on exertion, peripheral edema, headaches, abdominal pain, hematochezia, severe indigestion/heartburn, muscle weakness, transient blindness, difficulty walking, unusual weight change, enlarged lymph nodes, angioedema, and breast masses.    Physical Exam  General:  WNWD white female in no distress Head:  normocephalic, atraumatic, and no abnormalities observed.   Eyes:  vision grossly intact, pupils equal, pupils round, corneas and lenses clear, no injection, no optic disk abnormalities, and no retinal abnormalitiies.   Ears:  External ear exam shows no significant lesions or deformities.  Otoscopic examination reveals clear canals, tympanic membranes are intact bilaterally without bulging, retraction, inflammation or discharge. Hearing is grossly normal bilaterally. Nose:  no external deformity and no external erythema.   Mouth:  good dentition, no gingival abnormalities, no dental plaque, and pharynx pink and moist.   Neck:  supple, full ROM, no masses, no thyromegaly, and no carotid bruits.   Chest Wall:  Mild scoliosis to the left - less than 10 degrees. No CVAT tenderness  Breasts:  deferred to gyn Lungs:  Normal respiratory effort, chest expands symmetrically. Lungs are clear to auscultation, no crackles or wheezes. Heart:  normal rate, regular rhythm, no murmur, no JVD, and no HJR.   Abdomen:  Protruberant, soft, bowel sounds positive in all quadrants, no hepatomegaly Genitalia:  deferred Msk:  normal ROM, no joint tenderness, no joint swelling, no joint warmth, no redness over joints, and no joint instability.   Pulses:  2+ radial bilaterally, 1+ DP pulses bilaterally Extremities:  No clubbing, cyanosis, edema, or deformity noted with normal full range of motion of all joints.   Neurologic:  alert & oriented X3, cranial nerves II-XII intact, strength normal in all extremities, gait normal, and DTRs  symmetrical and normal.   Skin:  turgor normal, color normal, no rashes, and no ulcerations.   Cervical Nodes:  no anterior cervical adenopathy and no posterior cervical adenopathy.   Psych:  Oriented X3, memory intact for recent and remote, normally interactive, good eye contact, and not anxious appearing.     Impression & Recommendations:  Problem # 1:  CARDIAC OUTPUT, DECREASED (ICD-428.9) Patient reports decreased exercise tolerance with no obvious precipitating factors. She has moderate cardiac risk: post-menopausal, lipids. EKG with LBBB, minor voltage increase to suggest mild LVH  Plan - 2D echo to assess EF and wall motion  Orders: Cardiology Referral (Cardiology)  Problem # 2:  CHANGE IN BOWELS (ZOX-096.04) Patient does report a significant change in bowel habit since January. Her last colonoscopy was approximately 4 years. ago. She has had some mild weight change and marked increase in flatus.  Plan - trial of Beano for gas  refer for colonoscopy vs GI consult due to marked change in bowel habit.   Orders: Gastroenterology Referral (GI)  Problem # 3:  OSTEOPENIA (ICD-733.90) Lst DXA Sept '10. She will be due for follow-up DXA Sept 2011 or after.  Plan - continue dietary calcium and otc vitamin D [phone] international units daily.  Problem # 4:  DYSLIPIDEMIA (ICD-272.4)  Her updated medication list for this problem includes:    Lovastatin 20 Mg Tabs (Lovastatin) .[facility]... 2 tabs q pm for cholesterol  Labs Reviewed: SGOT: 24 (07/11/2009)   SGPT: 17 (07/11/2009)   HDL:107.00 (07/11/2009), 115.8 (12/30/2007)  LDL: 163(07/11/2009)  Chol:293 (07/11/2009), 212 (12/30/2007)  Trig:58.0 (07/11/2009), 33 (12/30/2007)  Plan - patient to resume lovastatin 40mg  q PM  Problem # 5:  CLAUDICATION (ICD-443.9) Patient gives a history strongly suggestive of claudication. On physical exam the DP pulse was diminished  Plan - 2D echo as above           if 2 D echo normal will  give consideration to lower extremity arterial doppler.  Problem # 6:  Preventive Health Care (ICD-V70.0) History as noted. Exam is generally normal. Lab results unremarkable except for elevated lipids. She is current with gyn and with mammography. She is a candidate for pneumonia vaccine and Zostavax.  In summary - a very nice woman with change in bowel habit and decreased exercise tolerance who is to be scheduled for follow-up studies. She will be notified as to the results.   Complete Medication List: 1)  Citalopram Hydrobromide 20 Mg Tabs (Citalopram hydrobromide) .... Take 1 tablet by mouth once a day 2)  Zolpidem Tartrate 10 Mg Tabs (Zolpidem tartrate) .... Take 1/2  tab by mouth at bedtime 3)  Caltrate 600+d 600-400 Mg-unit Tabs (Calcium carbonate-vitamin d) .... Take 1 tablet by mouth three times a day 4)  Lovastatin 20 Mg Tabs (Lovastatin) .... 2 tabs q pm for cholesterol 5)  Provera 2.5 Mg Tabs (Medroxyprogesterone acetate) .... Take 1 tablet by mouth once a day 6)  Estradial  .... Take 1 tablet by mouth once a day  Patient: Mandy Durham Note: All result statuses are Final unless otherwise noted.  Tests: (1) BMP (METABOL)   Sodium                    140 mEq/L                   135-145   Potassium                 4.4 mEq/L                   3.5-5.1   Chloride                  106 mEq/L                   96-112   Carbon Dioxide            30 mEq/L                    19-32   Glucose                   94 mg/dL                    10-27   BUN  12 mg/dL                    1-61   Creatinine                0.7 mg/dL                   0.9-6.0   Calcium                   8.9 mg/dL                   4.5-40.9   GFR                       89.32 mL/min                >60  Tests: (2) CBC Platelet w/Diff (CBCD)   White Cell Count          4.6 K/uL                    4.5-10.5   Red Cell Count            4.47 Mil/uL                 3.87-5.11   Hemoglobin                13.2  g/dL                   81.1-91.4   Hematocrit                39.1 %                      36.0-46.0   MCV                       87.4 fl                     78.0-100.0   MCHC                      33.8 g/dL                   78.2-95.6   RDW                       13.4 %                      11.5-14.6   Platelet Count            319.0 K/uL                  150.0-400.0   Neutrophil %              55.4 %                      43.0-77.0   Lymphocyte %              32.8 %                      12.0-46.0   Monocyte %                7.9 %  3.0-12.0   Eosinophils%              3.3 %                       0.0-5.0   Basophils %               0.6 %                       0.0-3.0   Neutrophill Absolute      2.6 K/uL                    1.4-7.7   Lymphocyte Absolute       1.5 K/uL                    0.7-4.0   Monocyte Absolute         0.4 K/uL                    0.1-1.0  Eosinophils, Absolute                             0.2 K/uL                    0.0-0.7   Basophils Absolute        0.0 K/uL                    0.0-0.1  Tests: (3) Hepatic/Liver Function Panel (HEPATIC)   Total Bilirubin           0.6 mg/dL                   0.4-5.4   Direct Bilirubin          0.1 mg/dL                   0.9-8.1   Alkaline Phosphatase      53 U/L                      39-117   AST                       24 U/L                      0-37   ALT                       17 U/L                      0-35   Total Protein             6.1 g/dL                    1.9-1.4   Albumin                   3.5 g/dL                    7.8-2.9  Tests: (4) TSH (TSH)   FastTSH                   2.25 uIU/mL  0.35-5.50  Tests: (5) Lipid Panel (LIPID)   Cholesterol          [H]  293 mg/dL                   1-607     ATP III Classification            Desirable:  < 200 mg/dL                    Borderline High:  200 - 239 mg/dL               High:  > = 240 mg/dL   Triglycerides             58.0 mg/dL                   3.7-106.2     Normal:  <150 mg/dL     Borderline High:  694 - 199 mg/dL   HDL                       854.62 mg/dL                >70.35   VLDL Cholesterol          11.6 mg/dL                  0.0-93.8  CHO/HDL Ratio:  CHD Risk                             3                    Men          Women     1/2 Average Risk     3.4          3.3     Average Risk          5.0          4.4     2X Average Risk          9.6          7.1     3X Average Risk          15.0          11.0                           Tests: (6) Cholesterol LDL - Direct (DIRLDL)  Cholesterol LDL - Direct                             163.0 mg/dL     Optimal:  <182 mg/dL     Near or Above Optimal:  100-129 mg/dL     Borderline High:  993-716 mg/dL     High:  967-893 mg/dL     Very High:  >810 mg/dLPrescriptions: ZOLPIDEM TARTRATE 10 MG  TABS (ZOLPIDEM TARTRATE) Take 1/2  tab by mouth at bedtime  #15 x 0   Entered by:   Ami Bullins CMA   Authorized by:   Jacques Navy MD   Signed by:   Jacques Navy MD on 07/18/2009   Method used:   Telephoned to ...       [facility] ([facility]ail[facility]       [address]       [place], [place]  [address]       Ph: [phone]       Fax: [phone]   RxID:   [id number] PROVERA 2.5 MG TABS (MEDROXYPROGESTERONE ACETATE) Take 1 tablet by mouth once a day  #90 x 3   Entered by:   Ami Bullins CMA   Authorized by:   Jacques Navy MD   Signed by:   Bill Salinas CMA on 07/18/2009   Method used:   [facility]        [facility]       [address]       [place], [place]  [address]       Ph: [phone]       Fax: [phone]   RxID:   [id number] LOVASTATIN 20 MG  TABS (LOVASTATIN) 2 tabs q PM for cholesterol  #180 x 3   Entered by:   Bill Salinas CMA   Authorized by:   Jacques Navy MD   Signed by:   Bill Salinas CMA on 07/18/2009   Method used:   Electronically to        [facility]  (retail)       [address]       [place], [place]  [address]       Ph: [phone]       Fax: [phone]   RxID:   [id number] CITALOPRAM HYDROBROMIDE 20 MG  TABS (CITALOPRAM HYDROBROMIDE) Take 1 tablet by mouth once a day  #90 Each x 3   Entered by:   Ami Bullins CMA   Authorized by:   Jacques Navy MD   Signed by:   Bill Salinas CMA on 07/18/2009   Method used:   Electronically to        [facility] (retail)       [address]       [place], [place]  [address]       Ph: [phone]       Fax: [phone]   RxID:   [id number] PROVERA 2.5 MG TABS (MEDROXYPROGESTERONE ACETATE) Take 1 tablet by mouth once a day  #90 x 3   Entered by:   Ami Bullins CMA   Authorized by:   Jacques Navy MD   Signed by:   Bill Salinas CMA on 07/18/2009   Method used:   Electronically to        [facility] [address] [address] [facility] (retail)       [address]       [place], [place]  [address]       Ph: [phone] or [phone]       Fax: [phone]   RxID:   [id number] LOVASTATIN 20 MG  TABS (LOVASTATIN) 2 tabs q PM for cholesterol  #180 x 3   Entered by:   Bill Salinas CMA   Authorized by:   Jacques Navy MD   Signed by:   Bill Salinas CMA on 07/18/2009   Method used:   Electronically to        [facility] [address] (retail)       [address]  [place], [place]  [address]       Ph: [phone] or [phone]       Fax: [phone]   RxID:   [id number] CITALOPRAM HYDROBROMIDE 20 MG  TABS (CITALOPRAM HYDROBROMIDE) Take 1 tablet by mouth once a day  #90 Each x 3   Entered by:   Ami Bullins CMA   Authorized by:   Jacques Navy MD   Signed by:   Bill Salinas CMA on 07/18/2009   Method used:   Electronically to        [facility] [address] [facility] (retail)       [address]       [place], [place]  [address]       Ph: [phone] or  [phone]       Fax: [phone]   RxID:   [id number]    Immunization History:  Influenza Immunization History:    Influenza:  historical (12/18/2008)

## 2010-04-17 NOTE — Letter (Signed)
Summary: [facility]  [facility]   Imported By: Lennie Odor 02/24/2010 10:34:00  _____________________________________________________________________  External Attachment:    Type:   Image     Comment:   External Document

## 2010-04-24 ENCOUNTER — Ambulatory Visit (HOSPITAL_COMMUNITY)
Admission: RE | Admit: 2010-04-24 | Discharge: 2010-04-24 | Disposition: A | Discharge status: Home / Self Care | Payer: Managed Care, Other (non HMO) | Source: Ambulatory Visit | Attending: Internal Medicine | Admitting: Internal Medicine

## 2010-04-24 ENCOUNTER — Ambulatory Visit (HOSPITAL_COMMUNITY): Admission: RE | Admit: 2010-04-24 | Payer: Self-pay | Source: Home / Self Care | Admitting: Internal Medicine

## 2010-04-24 ENCOUNTER — Encounter: Payer: Self-pay | Admitting: Internal Medicine

## 2010-04-24 DIAGNOSIS — Z1231 Encounter for screening mammogram for malignant neoplasm of breast: Secondary | ICD-10-CM | POA: Insufficient documentation

## 2010-07-07 ENCOUNTER — Other Ambulatory Visit (INDEPENDENT_AMBULATORY_CARE_PROVIDER_SITE_OTHER): Payer: Managed Care, Other (non HMO)

## 2010-07-07 ENCOUNTER — Ambulatory Visit: Payer: Managed Care, Other (non HMO) | Admitting: Internal Medicine

## 2010-07-07 ENCOUNTER — Encounter: Payer: Self-pay | Admitting: Internal Medicine

## 2010-07-07 ENCOUNTER — Other Ambulatory Visit (INDEPENDENT_AMBULATORY_CARE_PROVIDER_SITE_OTHER): Payer: Managed Care, Other (non HMO) | Admitting: Internal Medicine

## 2010-07-07 ENCOUNTER — Ambulatory Visit (INDEPENDENT_AMBULATORY_CARE_PROVIDER_SITE_OTHER): Payer: Managed Care, Other (non HMO) | Admitting: Internal Medicine

## 2010-07-07 DIAGNOSIS — M1990 Unspecified osteoarthritis, unspecified site: Secondary | ICD-10-CM

## 2010-07-07 DIAGNOSIS — E785 Hyperlipidemia, unspecified: Secondary | ICD-10-CM

## 2010-07-07 DIAGNOSIS — R202 Paresthesia of skin: Secondary | ICD-10-CM | POA: Insufficient documentation

## 2010-07-07 DIAGNOSIS — R631 Polydipsia: Secondary | ICD-10-CM

## 2010-07-07 DIAGNOSIS — R209 Unspecified disturbances of skin sensation: Secondary | ICD-10-CM

## 2010-07-07 LAB — VITAMIN B12: Vitamin B-12: 450 pg/mL (ref 211–911)

## 2010-07-07 LAB — COMPREHENSIVE METABOLIC PANEL
AST: 35 U/L (ref 0–37)
Alkaline Phosphatase: 59 U/L (ref 39–117)
Glucose, Bld: 81 mg/dL (ref 70–99)
Sodium: 142 mEq/L (ref 135–145)
Total Bilirubin: 0.4 mg/dL (ref 0.3–1.2)
Total Protein: 6.5 g/dL (ref 6.0–8.3)

## 2010-07-07 LAB — LIPID PANEL
Total CHOL/HDL Ratio: 2
Triglycerides: 109 mg/dL (ref 0.0–149.0)

## 2010-07-07 LAB — LDL CHOLESTEROL, DIRECT: Direct LDL: 79.9 mg/dL

## 2010-07-07 LAB — TSH: TSH: 1.73 u[IU]/mL (ref 0.35–5.50)

## 2010-07-07 NOTE — Progress Notes (Signed)
Subjective:    Patient ID: Mandy Durham, female    DOB: [birth year], 65 y.o.   MRN: [id number]  HPISeveral issues today: 1. Right shoulder pain - since January when she was lifting boxes. Pain in the deltoid. Good ROM 2. Thirsty all the time - has family h/o diabetes and she is concerned. 3. Needs to know what her cholesterol is.  4. Balls of the feet feel thickened but there is no visible change. 5. Asks when she is due for colonoscopy - record checked: due in 2017 6. DOE with stair climbing but rapid recovery with no chest pain 7. Weight gain - no change in appetite.   Past Medical History  Diagnosis Date  . Insomnia   . Adenomatous polyp of colon   . Overweight   . Symptomatic menopausal or female climacteric states   . Disorder of bone and cartilage, unspecified   . Other and unspecified hyperlipidemia   . Osteoarthrosis, unspecified whether generalized or localized, unspecified site   . Depressive disorder, not elsewhere classified   . Anxiety state, unspecified    Past Surgical History  Procedure Date  . Tubal ligation    Family History  Problem Relation Age of Onset  . Heart failure Mother   . Coronary artery disease Mother   . Dementia Mother   . Colon cancer Neg Hx   . Breast cancer Neg Hx    History   Social History  . Marital Status: Married    Spouse Name: N/A    Number of Children: N/A  . Years of Education: N/A   Occupational History  . 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 Smoking status: Not on file  . Smokeless tobacco: Not on file  . Alcohol Use:   . Drug Use:   . Sexually Active:    Other Topics Concern  . Not on file   Social History Narrative   HSG.  Married '65.  2 daughters, '71, '77; 4 grandchildren       Review of Systems Review of Systems  Constitutional:  Negative for fever, chills, activity change. Positive unexpected weight change - gain to highest since childbearing years.  HENT:   Negative for hearing loss, ear pain, congestion, neck stiffness and postnasal drip.   Eyes: Negative for pain, discharge and visual disturbance.  Respiratory: Negative for chest tightness and wheezing.   Cardiovascular: Negative for chest pain and palpitations.       [No decreased exercise tolerance Gastrointestinal: [No change in bowel habit. No bloating or gas. No reflux or indigestion Genitourinary: Negative for urgency, frequency, flank pain and difficulty urinating.  Musculoskeletal: Negative for myalgias, back pain, arthralgias and gait problem.  Neurological: Negative for dizziness, tremors, weakness and headaches.  Hematological: Negative for adenopathy.  Psychiatric/Behavioral: Negative for behavioral problems and dysphoric mood.       Objective:   Physical Exam WNWD overseight white female in no distress HEENT - nl, Neck - nl range of motion, no thyromegaly Lungs - clear Cor - RRR, no murmur Ext - right shoulder with normal range of motion. No click no crepitus. Wth adduction against resistance - discomfort at 90 degrees.    Lab Results  Component Value Date   WBC 4.6 07/11/2009   HGB 13.2 07/11/2009   HCT 39.1 07/11/2009   PLT 319.0 07/11/2009   CHOL 226* 07/07/2010   TRIG 109.0 07/07/2010   HDL 123.30 07/07/2010   LDLDIRECT 79.9 07/07/2010   ALT  28 07/07/2010   AST 35 07/07/2010   NA 142 07/07/2010   K 4.0 07/07/2010   CL 102 07/07/2010   CREATININE 0.7 07/07/2010   BUN 14 07/07/2010   CO2 27 07/07/2010   TSH 1.73 07/07/2010   HGBA1C 6.1 07/07/2010       Glucose                  81       TSH                       1.73       B12 -                     430    Assessment & Plan:  1. Cholesterol - excellent lab values with a very good HDL - protective. The bad cholesterol - LDL is below goal of 130. No liver function abnormalities.   Plan - continue low dose lovastatin which is providing excellent treatment.  2. Thirst - blood sugar is normal and the A1C, a long term test  for blood sugar, is normal.  3. Loss of sensation in feet - that is mild: B12 and thyroid functions are normal. Loss of feeling is idiopathic.   Plan - no further work-up unless paresthesia is progressive.  4. Right Shoulder pain - advised to do ROM and stretching. If symptoms get worse will refer to orthopedist.  5. Weight management - discussed smart choices, portion size control and exercise. Target 140, goal to loose 1 lb per month ( 17 month project).  6. Health maintenance - had normal mammogram in February 2012, is current with colonoscopy. Current with immunizations.

## 2010-07-07 NOTE — Patient Instructions (Addendum)
Shoulder pain - on exam this seems to be a minor problem with the rotator cuff. I recommend tylenol 500-100mg  every 8 hours as needed or aleve twice a day. Range of motion exercise - go to YouTube.com and search for shoulder-range of motion exercises Thirst - will check for diabetes Loss of sensation at the balls of the feet - there is some subtle loss of sensation on exam. Plan - check sugar, thyroid and B12. Weight management: 1) smart food choices 2) portion size control - using your hand as a guide 3) regular exercise. Target weight 140 lbs; goal is to loose 1 lb per month. Due for colonoscopy in 2017 You will receive a letter with your lab results and recommendations.

## 2010-07-09 ENCOUNTER — Other Ambulatory Visit: Payer: Self-pay | Admitting: Internal Medicine

## 2010-07-11 ENCOUNTER — Ambulatory Visit: Payer: Managed Care, Other (non HMO) | Admitting: Internal Medicine

## 2010-08-01 NOTE — Cardiovascular Report (Signed)
[place] [place]  Patient:    Mandy Durham, Mandy Durham                        MRN: [id number] Proc. Date: 06/07/00 Adm. Date:  [id number] Disc. Date: [id number] Attending:  Eleanora Neighbor CC:         Nichola Sizer, M.D.   Cardiac Catheterization  REFERRING PHYSICIAN:  Nichola Sizer, M.D.  INDICATIONS:  [name] presents with recurrent episodes of anterior chest pain.  She had T wave changes anteriorly, very suggestive of anterior infarction, but on her stress Cardiolite study she had a left bundle-branch block that was rate related.  However, she continued to have recurrent chest pain and because of this was referred for catheterization.  PROCEDURES:  Left heart catheterization with selective coronary angiography, left ventricular angiography with aortic root angiography, and Perclose.  TYPE AND SITE OF ENTRY:  Percutaneous right femoral artery.  CATHETERS:  A 6 French 4 curved Judkins right and left coronary catheters, 6 French pigtail ventriculographic catheter.  CONTRAST MATERIAL:  Omnipaque.  MEDICATIONS GIVEN PRIOR TO THE PROCEDURE:  Valium 10 mg p.o.  MEDICATIONS GIVEN DURING THE PROCEDURE:  Versed 3 mg IV.  COMMENTS:  The patient tolerated the procedure well.  ANGIOGRAPHIC DATA: 1. Left main coronary artery:  Left main coronary artery is normal. 2. Left circumflex:  The left circumflex is a large dominant system.    It is a large obtuse marginal as well as posterior descending vessel.    It is normal. 3. Left anterior descending:  The left anterior descending bifurcates with a    large second diagonal vessel.  It is normal. 4. Intermediate:  There is a small intermediate vessel. 5. Right coronary artery:  The right coronary artery is congenitally small.    It is normal.  LEFT VENTRICULOGRAPHY:  Left ventricular angiogram was performed in the RAO position.  Overall cardiac size and silhouette are normal.  There may be some very  faint anterior hypokinesis, but basically the left ventricular function is normal.  There is no mitral regurgitation, intracardiac calcification or intracavity filling defect.  Global ejection fraction is estimated to be 60%.  AORTIC ROOT:  The aortic root was performed using 30 cc of contrast at 20 cc/sec.  There is no aortic insufficiency.  OVERALL IMPRESSION: 1. Normal left ventricular function. 2. Normal coronary arteries. 3. No aortic insufficiency. 4. Left bundle-branch block, intermittently noted during the catheterization,    is felt to be rate related.  DISCUSSION:  It is felt that [name] ECG changes are related to her left bundle-branch block.  Her chest pain is not felt to have a coronary etiology and may be related to emotional stress. DD:  06/07/00 TD:  06/08/00 Job: [id number]

## 2010-08-01 NOTE — Op Note (Signed)
[facility] [place]  Patient:    Mandy Durham, Mandy Durham                        MRN: [id number] Proc. Date: 12/05/99 Adm. Date:  [id number] Disc. Date: [id number] Attending:  Lenoard Aden CC:         [facility]   Operative Report  PREOPERATIVE DIAGNOSES:       1. Postmenopausal bleeding.                               2. Questionable endometrial polyp versus                                  endometrial mass on ultrasound.  POSTOPERATIVE DIAGNOSIS:      Endometrial polyp.  OPERATIONS:                   1. Diagnostic hysteroscopy.                               2. Resectoscopic polypectomy.                               3. Dilatation and curettage.  SURGEON:                      Lenoard Aden, M.D.  ANESTHESIA:                   General.  ESTIMATED BLOOD LOSS:         Less than 50 cc.  FLUID DEFICIT:                10 cc.  SPECIMEN:                     Endometrial polyp and endometrial curettings.  COUNTS:                       Correct.  DISPOSITION:                  Patient to recovery in good condition.  BRIEF OPERATIVE NOTE:         After being apprised of the risks of anesthesia, infection, bleeding, uterine perforation, and need for repair, the patient was brought to the operating room where she was administered general anesthetic without complications.  Prepped and draped in the usual sterile fashion. Catheterized until the bladder was empty.  At this time, exam under anesthesia revealed a small anteflexed uterus, no adnexal masses, and no cul-de-sac masses.  Dilute Pitressin solution placed at 3 and 9 oclock.  Anterior lip of the cervix grasped using a single-tooth tenaculum.  Uterine cervix dilated up to #23 [facility] dilator.  Diagnostic hysteroscope placed.  Endometrial mass consistent with a large endometrial polyp was noted.  The hysteroscope was removed.  Dilation up to a #31 Pratt dilator was performed.  Hysteroscope with resectoscope  placed.  Resection performed with a double loop right angle loop for removal of this entire polypoid mass on four passes with good hemostasis. Endometrial curettings were then collected in standard fashion using a serrated curet.  Revisualization revealed an empty cavity.  Bilateral normal  tubal ostia.  All instruments were removed.  A fluid deficit of 10 cc was noted.  The specimen was sent to pathology for permanent section.  The patient was awaken and transferred to recovery in good condition after removal of all instruments. DD:  12/05/99 TD:  12/07/99 Job: [id number]

## 2010-08-04 ENCOUNTER — Other Ambulatory Visit: Payer: Self-pay | Admitting: Internal Medicine

## 2010-10-14 ENCOUNTER — Other Ambulatory Visit: Payer: Self-pay | Admitting: Internal Medicine

## 2011-02-02 ENCOUNTER — Other Ambulatory Visit: Payer: Self-pay | Admitting: Internal Medicine

## 2011-02-03 ENCOUNTER — Telehealth: Payer: Self-pay | Admitting: Internal Medicine

## 2011-02-03 NOTE — Telephone Encounter (Signed)
Pharmacy is asking to refill zolpidem (AMBIEN) 10 MG tablet [Pharmacy Med Name: ZOLPIDEM TARTRATE 10 MG TABLET] take 1/2 tablet by mouth at bedtime if needed.  Please advise.

## 2011-02-03 NOTE — Telephone Encounter (Signed)
Pt wanting appointment for abd pain

## 2011-02-04 NOTE — Telephone Encounter (Signed)
Called pt no answer will try back later 

## 2011-02-04 NOTE — Telephone Encounter (Signed)
Ok for refill on all prescriptions x 11 except for zolpidem ok for refill x 5

## 2011-02-06 MED ORDER — ZOLPIDEM TARTRATE 10 MG PO TABS: 10.0000 mg | ORAL_TABLET | Freq: Every evening | ORAL | Status: DC | PRN

## 2011-02-06 NOTE — Telephone Encounter (Signed)
Pt has appointment 02/12/2011 with Norins

## 2011-02-12 ENCOUNTER — Other Ambulatory Visit (INDEPENDENT_AMBULATORY_CARE_PROVIDER_SITE_OTHER): Payer: Managed Care, Other (non HMO)

## 2011-02-12 ENCOUNTER — Ambulatory Visit (INDEPENDENT_AMBULATORY_CARE_PROVIDER_SITE_OTHER): Payer: Managed Care, Other (non HMO) | Admitting: Internal Medicine

## 2011-02-12 DIAGNOSIS — R198 Other specified symptoms and signs involving the digestive system and abdomen: Secondary | ICD-10-CM

## 2011-02-12 DIAGNOSIS — R194 Change in bowel habit: Secondary | ICD-10-CM

## 2011-02-12 DIAGNOSIS — R55 Syncope and collapse: Secondary | ICD-10-CM

## 2011-02-12 DIAGNOSIS — Z23 Encounter for immunization: Secondary | ICD-10-CM

## 2011-02-12 LAB — COMPREHENSIVE METABOLIC PANEL
Albumin: 3.8 g/dL (ref 3.5–5.2)
BUN: 11 mg/dL (ref 6–23)
Calcium: 8.8 mg/dL (ref 8.4–10.5)
Chloride: 107 mEq/L (ref 96–112)
Creatinine, Ser: 0.8 mg/dL (ref 0.4–1.2)
Glucose, Bld: 98 mg/dL (ref 70–99)
Potassium: 4 mEq/L (ref 3.5–5.1)

## 2011-02-12 LAB — HEPATIC FUNCTION PANEL
Alkaline Phosphatase: 62 U/L (ref 39–117)
Bilirubin, Direct: 0.1 mg/dL (ref 0.0–0.3)
Total Bilirubin: 0.4 mg/dL (ref 0.3–1.2)

## 2011-02-12 LAB — LIPASE: Lipase: 31 U/L (ref 11.0–59.0)

## 2011-02-12 MED ORDER — TETANUS-DIPHTH-ACELL PERTUSSIS 5-2.5-18.5 LF-MCG/0.5 IM SUSP: 0.5000 mL | Freq: Once | INTRAMUSCULAR | Status: DC

## 2011-02-12 NOTE — Patient Instructions (Signed)
Malodorous stool - may be a sign of increased fat content in the stool (steatorrhea) from liver or gallbladder disease. Plan - lab work to look at liver function; ultrasound exam of the gallbladder and liver - to be scheduled for you.  Syncope - loss of consciousness. The sudden nature and resulting injury raises the concern for an irregular heart rhythm. Plan - 72 hr holter monitor to be set up for you to monitor your heart rhythm. Appointment with a [place] Cardiologist to review the Holter results and help with a complete evaluation for any potential underlying cardiac problem.  Wound care - please wash with soap and water, rinse, dry, apply triple antibiotic ointment to the abrasion on the bridge of your nose. We will give you a tetanus shot today.

## 2011-02-12 NOTE — Progress Notes (Signed)
  Subjective:    Patient ID: Mandy Durham, female    DOB: 12-16-1945, 65 y.o.   MRN: [id number]  HPI Mandy Durham reports that she has lost 10 lb through portions size and exercise.  She is having very odiferous stool and flatus. She does have some fatty food intolerance. Stools are normal in color, caliber and consistancy.   Last Tuesday she gave blood - in the evening she passed out, sustaining abrasion to nose. She has a history of evaluation for cardiac symptoms several years ago with Dr. Delfin Edis. She denies having any palpation or chest pain or other cardiac symptoms at the time of her fall or after. She is very clear that she had no warning of impending syncope.   Past Medical History  Diagnosis Date  . Insomnia   . Adenomatous polyp of colon   . Overweight   . Symptomatic menopausal or female climacteric states   . Disorder of bone and cartilage, unspecified   . Other and unspecified hyperlipidemia   . Osteoarthrosis, unspecified whether generalized or localized, unspecified site   . Depressive disorder, not elsewhere classified   . Anxiety state, unspecified    Past Surgical History  Procedure Date  . Tubal ligation    Family History  Problem Relation Age of Onset  . Heart failure Mother   . Coronary artery disease Mother   . Dementia Mother   . Colon cancer Neg Hx   . Breast cancer Neg Hx    History   Social History  . Marital Status: Married    Spouse Name: N/A    Number of Children: N/A  . Years of Education: N/A   Occupational History  . 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 Smoking status: Not on file  . Smokeless tobacco: Not on file  . Alcohol Use:   . Drug Use:   . Sexually Active:    Other Topics Concern  . Not on file   Social History Narrative   HSG.  Married '65.  2 daughters, '71, '77; 4 grandchildren       Review of Systems  Constitutional: Negative.   HENT: Negative.   Eyes: Negative.     Respiratory: Negative.   Cardiovascular: Negative.   Gastrointestinal: Negative for vomiting, abdominal pain, diarrhea, abdominal distention, anal bleeding and rectal pain.  Genitourinary: Negative.   Musculoskeletal: Negative.   Skin: Negative.   Neurological: Negative.   Hematological: Negative.   Psychiatric/Behavioral: Negative.        Objective:   Physical Exam Vital signs reviewed - normal Gen'l- WNWD white woman in no disress HEENT_ abrasion of the nose, bandaid across bridge of nose, PERRLA, EOMI Chest - CTAP, normal breath sounds Cor - 2+ radial pulse, quiet precordium, RRR w/o m/r/g Abd - BS + x 4, mild tenderness to percussion and deep palpation RUQ Neuro - A&O x 3, XCN II-XII normal, cerebellar - normal, normal gait.        Assessment & Plan:

## 2011-02-13 ENCOUNTER — Ambulatory Visit
Admission: RE | Admit: 2011-02-13 | Discharge: 2011-02-13 | Disposition: A | Discharge status: Home / Self Care | Payer: Managed Care, Other (non HMO) | Source: Ambulatory Visit | Attending: Internal Medicine | Admitting: Internal Medicine

## 2011-02-13 DIAGNOSIS — R194 Change in bowel habit: Secondary | ICD-10-CM

## 2011-02-15 DIAGNOSIS — R55 Syncope and collapse: Secondary | ICD-10-CM | POA: Insufficient documentation

## 2011-02-15 NOTE — Assessment & Plan Note (Signed)
Patient with a drop attack - syncope w/o warning. She is stable at the time of exam. She had no post event sequelae. She denies any cardiac symptoms  Plan - 72 hour holter monitor           Cardiology consultation.

## 2011-02-16 ENCOUNTER — Encounter: Payer: Self-pay | Admitting: Internal Medicine

## 2011-02-18 ENCOUNTER — Other Ambulatory Visit: Payer: Self-pay | Admitting: Internal Medicine

## 2011-02-26 ENCOUNTER — Other Ambulatory Visit (HOSPITAL_COMMUNITY): Payer: Self-pay | Admitting: Obstetrics and Gynecology

## 2011-02-26 DIAGNOSIS — Z780 Asymptomatic menopausal state: Secondary | ICD-10-CM

## 2011-03-02 ENCOUNTER — Encounter (INDEPENDENT_AMBULATORY_CARE_PROVIDER_SITE_OTHER): Payer: Managed Care, Other (non HMO)

## 2011-03-02 DIAGNOSIS — R55 Syncope and collapse: Secondary | ICD-10-CM

## 2011-03-05 ENCOUNTER — Ambulatory Visit (INDEPENDENT_AMBULATORY_CARE_PROVIDER_SITE_OTHER): Payer: Managed Care, Other (non HMO) | Admitting: Cardiology

## 2011-03-05 ENCOUNTER — Encounter: Payer: Self-pay | Admitting: Cardiology

## 2011-03-05 VITALS — BP 132/76 | HR 76 | Ht 65.0 in | Wt 150.0 lb

## 2011-03-05 DIAGNOSIS — R55 Syncope and collapse: Secondary | ICD-10-CM

## 2011-03-05 NOTE — Patient Instructions (Signed)
Your physician has requested that you have an echocardiogram. Echocardiography is a painless test that uses sound waves to create images of your heart. It provides your doctor with information about the size and shape of your heart and how well your heart's chambers and valves are working. This procedure takes approximately one hour. There are no restrictions for this procedure.  Your physician has recommended that you wear an event monitor. Event monitors are medical devices that record the heart's electrical activity. Doctors most often [place] these monitors to diagnose arrhythmias. Arrhythmias are problems with the speed or rhythm of the heartbeat. The monitor is a small, portable device. You can wear one while you do your normal daily activities. This is usually used to diagnose what is causing palpitations/syncope (passing out). 3 week event   Your physician recommends that you schedule a follow-up appointment in:1 month with Dr Shirlee Latch.

## 2011-03-06 ENCOUNTER — Ambulatory Visit (HOSPITAL_COMMUNITY)
Admission: RE | Admit: 2011-03-06 | Discharge: 2011-03-06 | Disposition: A | Discharge status: Home / Self Care | Payer: Managed Care, Other (non HMO) | Source: Ambulatory Visit | Attending: Obstetrics and Gynecology | Admitting: Obstetrics and Gynecology

## 2011-03-06 DIAGNOSIS — Z13820 Encounter for screening for osteoporosis: Secondary | ICD-10-CM | POA: Insufficient documentation

## 2011-03-06 DIAGNOSIS — Z780 Asymptomatic menopausal state: Secondary | ICD-10-CM | POA: Insufficient documentation

## 2011-03-06 NOTE — Assessment & Plan Note (Signed)
Patient had a syncopal event on 11/27 with no prodrome.  She had given blood that day, but it had been several hours prior and she had had no earlier symptoms.  No symptoms since that time.  She does have evidence for conduction system abnormality with a chronic LBBB.  It is possible that she has intermittent heart block.  Holter showed no significant events.  I think she probably should have more prolonged cardiac monitoring.  I will get a 3 week event monitor.  I will also get an echocardiogram.  Cannot rule out a simple orthostatic or vagal-type event in the setting of giving blood earlier in the day, especially if she did not stay well-hydrated that day.

## 2011-03-06 NOTE — Progress Notes (Signed)
PCP: Dr. Debby Bud  65 yo with history of chronic LBBB presents for evaluation of syncope.  Patient had a syncopal episode in November.  She was walking from her office to her car and apparently passed out and fell on her face.  No prodrome.  She was not unconscious for long.  No tachypalpitations or dyspnea.  No chest pain.  She had given blood about 4 hours earlier that day but had had no orthostatic symptoms and seemed to be doing well after the blood draw.  She had had no prior syncopal spells and has had none since.  No lightheadedness or presyncope.  At baseline, she has good exercise tolerance and works out at [employer], doing the treadmill and elliptical.    She does have a chronic LBBB as mentioned above.  This has been noted since at least 2000.  She had a cath in 2002 because of chest pain that showed no angiographic CAD.   ECG: NSR, LBBB  Labs (4/12): LDL 80, HDL 123, TSH normal Labs (40/98): K 4, creatinine 0.8  PMH: 1. Depression 2. Hyperlipidemia 3. Osteoarthritis 4. LBBB since around 2000.  LHC in 2002 showed normal coronaries.   5. Syncope 11/12: Holter 12/12 with rare PACS, HR range 64-140, average 82, no significant arrhythmias.    SH: Lives in [place], married, nonsmoker, works as an Transport planner.   FH: No CAD that she knows of.  No CHF.   ROS: All systems reviewed and negative except as per HPI.   Current Outpatient Prescriptions  Medication Sig Dispense Refill  . citalopram (CELEXA) 20 MG tablet TAKE ONE TABLET BY MOUTH EVERY DAY  90 tablet  3  . Estradiol (ESTRACE PO) Take by mouth daily.        Marland Kitchen lovastatin (MEVACOR) 20 MG tablet TAKE TWO TABLETS BY MOUTH EVERY DAY IN THE EVENING FOR CHOLESTEROL  180 tablet  2  . medroxyPROGESTERone (PROVERA) 2.5 MG tablet Take 2.5 mg by mouth daily.        Marland Kitchen zolpidem (AMBIEN) 10 MG tablet Take 1 tablet (10 mg total) by mouth at bedtime as needed for sleep.  90 tablet  2   Current Facility-Administered Medications    Medication Dose Route Frequency Provider Last Rate Last Dose  . TDaP (BOOSTRIX) injection 0.5 mL  0.5 mL Intramuscular Once Carney Harder Norins, MD        BP 132/76  Pulse 76  Ht 5\' 5"  (1.651 m)  Wt 68.04 kg (150 lb)  BMI 24.96 kg/m2 General: NAD Neck: No JVD, no thyromegaly or thyroid nodule.  Lungs: Clear to auscultation bilaterally with normal respiratory effort. CV: Nondisplaced PMI.  Heart regular S1/S2, no S3/S4, no murmur.  No peripheral edema.  No carotid bruit.  Normal pedal pulses.  Abdomen: Soft, nontender, no hepatosplenomegaly, no distention.  Skin: Intact without lesions or rashes.  Neurologic: Alert and oriented x 3.  Psych: Normal affect. Extremities: No clubbing or cyanosis.  HEENT: Normal.

## 2011-03-23 ENCOUNTER — Ambulatory Visit (HOSPITAL_COMMUNITY): Payer: Managed Care, Other (non HMO) | Attending: Cardiology | Admitting: Radiology

## 2011-03-23 ENCOUNTER — Encounter (INDEPENDENT_AMBULATORY_CARE_PROVIDER_SITE_OTHER): Payer: Managed Care, Other (non HMO)

## 2011-03-23 DIAGNOSIS — I447 Left bundle-branch block, unspecified: Secondary | ICD-10-CM | POA: Insufficient documentation

## 2011-03-23 DIAGNOSIS — R55 Syncope and collapse: Secondary | ICD-10-CM | POA: Insufficient documentation

## 2011-03-23 DIAGNOSIS — E785 Hyperlipidemia, unspecified: Secondary | ICD-10-CM | POA: Insufficient documentation

## 2011-04-22 ENCOUNTER — Ambulatory Visit: Payer: Managed Care, Other (non HMO) | Admitting: Cardiology

## 2011-04-22 ENCOUNTER — Telehealth: Payer: Self-pay | Admitting: *Deleted

## 2011-04-22 NOTE — Telephone Encounter (Signed)
Dr Shirlee Latch reviewed end of service report 03/23/11-04/12/11. No major abnormality seen. Pt notified.

## 2011-05-15 ENCOUNTER — Ambulatory Visit (INDEPENDENT_AMBULATORY_CARE_PROVIDER_SITE_OTHER): Payer: Managed Care, Other (non HMO) | Admitting: Cardiology

## 2011-05-15 ENCOUNTER — Encounter: Payer: Self-pay | Admitting: Cardiology

## 2011-05-15 DIAGNOSIS — R55 Syncope and collapse: Secondary | ICD-10-CM

## 2011-05-15 NOTE — Patient Instructions (Signed)
You do not need to schedule a follow-up appointment with Dr McLean. 

## 2011-05-17 NOTE — Assessment & Plan Note (Signed)
Patient had a syncopal event on 11/27 with no prodrome.  She had given blood that day, but it had been several hours prior and she had had no earlier symptoms.  No symptoms since that time.  She does have evidence for conduction system abnormality with a chronic LBBB.  It is possible that she has intermittent heart block.  However, Holter showed no significant events and 3-week event monitor was also unremarkable.  Echo showed EF 50-55% with dyssynchrony suggestive of LBBB.  I suspect that she had a simple orthostatic or vagal-type event in the setting of giving blood earlier in the day, especially if she did not stay well-hydrated that day.  No further diagnostic testing is necessary.

## 2011-05-17 NOTE — Progress Notes (Signed)
PCP: Dr. Debby Bud  66 yo with history of chronic LBBB presents for evaluation of syncope.  Patient had a syncopal episode in November.  She was walking from her office to her car and apparently passed out and fell on her face.  No prodrome.  She was not unconscious for long.  No tachypalpitations or dyspnea.  No chest pain.  She had given blood about 4 hours earlier that day but had had no orthostatic symptoms and seemed to be doing well after the blood draw.  She had had no prior syncopal spells and has had none since.  No lightheadedness or presyncope.  At baseline, she has good exercise tolerance and works out at [employer], doing the treadmill and elliptical.    She does have a chronic LBBB as mentioned above.  This has been noted since at least 2000.  She had a cath in 2002 because of chest pain that showed no angiographic CAD.   I had her do an echocardiogram, which showed EF 50-55% with septal-lateral dyssynchrony consistent with LBBB.  3 week event monitor showed no significant arrhythmia.   ECG: NSR, LBBB  Labs (4/12): LDL 80, HDL 123, TSH normal Labs (40/98): K 4, creatinine 0.8  PMH: 1. Depression 2. Hyperlipidemia 3. Osteoarthritis 4. LBBB since around 2000.  LHC in 2002 showed normal coronaries.   5. Syncope 11/12: Holter 12/12 with rare PACS, HR range 64-140, average 82, no significant arrhythmias.  Echo (1/13): EF 50-55%, mild LVH, septal-lateral dyssynchrony c/w LBBB.  3-week event monitor (1/13): No significant arrhythmia.   SH: Lives in [place], married, nonsmoker, works as an Transport planner.   FH: No CAD that she knows of.  No CHF.    Current Outpatient Prescriptions  Medication Sig Dispense Refill  . citalopram (CELEXA) 20 MG tablet TAKE ONE TABLET BY MOUTH EVERY DAY  90 tablet  3  . Estradiol (ESTRACE PO) Take by mouth daily.        Marland Kitchen lovastatin (MEVACOR) 20 MG tablet TAKE TWO TABLETS BY MOUTH EVERY DAY IN THE EVENING FOR CHOLESTEROL  180 tablet  2  .  medroxyPROGESTERone (PROVERA) 2.5 MG tablet Take 2.5 mg by mouth daily.        Marland Kitchen zolpidem (AMBIEN) 10 MG tablet Take 1 tablet (10 mg total) by mouth at bedtime as needed for sleep.  90 tablet  2   Current Facility-Administered Medications  Medication Dose Route Frequency Provider Last Rate Last Dose  . TDaP (BOOSTRIX) injection 0.5 mL  0.5 mL Intramuscular Once Carney Harder Norins, MD        BP 142/78  Pulse 76  Ht 5\' 5"  (1.651 m)  Wt 151 lb (68.493 kg)  BMI 25.13 kg/m2 General: NAD Neck: No JVD, no thyromegaly or thyroid nodule.  Lungs: Clear to auscultation bilaterally with normal respiratory effort. CV: Nondisplaced PMI.  Heart regular S1/S2, no S3/S4, no murmur.  No peripheral edema.  No carotid bruit.  Normal pedal pulses.  Abdomen: Soft, nontender, no hepatosplenomegaly, no distention.  Neurologic: Alert and oriented x 3.  Psych: Normal affect. Extremities: No clubbing or cyanosis.

## 2011-05-18 ENCOUNTER — Other Ambulatory Visit (HOSPITAL_COMMUNITY): Payer: Self-pay | Admitting: Obstetrics and Gynecology

## 2011-05-18 DIAGNOSIS — Z1231 Encounter for screening mammogram for malignant neoplasm of breast: Secondary | ICD-10-CM

## 2011-05-18 DIAGNOSIS — Z1239 Encounter for other screening for malignant neoplasm of breast: Secondary | ICD-10-CM

## 2011-05-19 ENCOUNTER — Ambulatory Visit (HOSPITAL_COMMUNITY)
Admission: RE | Admit: 2011-05-19 | Discharge: 2011-05-19 | Disposition: A | Discharge status: Home / Self Care | Payer: Managed Care, Other (non HMO) | Source: Ambulatory Visit | Attending: Obstetrics and Gynecology | Admitting: Obstetrics and Gynecology

## 2011-05-19 DIAGNOSIS — Z1231 Encounter for screening mammogram for malignant neoplasm of breast: Secondary | ICD-10-CM | POA: Insufficient documentation

## 2011-05-21 ENCOUNTER — Other Ambulatory Visit: Payer: Self-pay | Admitting: Obstetrics and Gynecology

## 2011-05-21 DIAGNOSIS — R928 Other abnormal and inconclusive findings on diagnostic imaging of breast: Secondary | ICD-10-CM

## 2011-05-25 ENCOUNTER — Ambulatory Visit
Admission: RE | Admit: 2011-05-25 | Discharge: 2011-05-25 | Disposition: A | Discharge status: Home / Self Care | Payer: Managed Care, Other (non HMO) | Source: Ambulatory Visit | Attending: Obstetrics and Gynecology | Admitting: Obstetrics and Gynecology

## 2011-05-25 DIAGNOSIS — R928 Other abnormal and inconclusive findings on diagnostic imaging of breast: Secondary | ICD-10-CM

## 2011-06-08 ENCOUNTER — Encounter: Payer: Self-pay | Admitting: Internal Medicine

## 2011-06-08 ENCOUNTER — Ambulatory Visit (INDEPENDENT_AMBULATORY_CARE_PROVIDER_SITE_OTHER): Payer: Managed Care, Other (non HMO) | Admitting: Internal Medicine

## 2011-06-08 VITALS — BP 120/62 | HR 80 | Temp 97.0°F | Ht 65.5 in | Wt 153.8 lb

## 2011-06-08 DIAGNOSIS — L309 Dermatitis, unspecified: Secondary | ICD-10-CM

## 2011-06-08 DIAGNOSIS — L259 Unspecified contact dermatitis, unspecified cause: Secondary | ICD-10-CM

## 2011-06-08 DIAGNOSIS — E785 Hyperlipidemia, unspecified: Secondary | ICD-10-CM

## 2011-06-08 DIAGNOSIS — M79609 Pain in unspecified limb: Secondary | ICD-10-CM

## 2011-06-08 DIAGNOSIS — M79673 Pain in unspecified foot: Secondary | ICD-10-CM

## 2011-06-08 MED ORDER — TRIAMCINOLONE ACETONIDE 0.1 % EX CREA: TOPICAL_CREAM | Freq: Two times a day (BID) | CUTANEOUS | Status: DC

## 2011-06-08 NOTE — Patient Instructions (Signed)
It was good to see you today. Start triamcinolone cream for your rash and itch - Your prescription(s) have been submitted to your pharmacy. Please take as directed and contact our office if you believe you are having problem(s) with the medication(s). Ok to hold cholesterol medication for one week to see if this helps your foot pain.  If you plan to remain off cholesterol medicine, or if the foot pain is unchanged, call Dr. Scarlett Presto for office visit to review

## 2011-06-08 NOTE — Progress Notes (Signed)
  Subjective:    Patient ID: Mandy Durham, female    DOB: 1945-11-11, 66 y.o.   MRN: [id number]  Rash This is a new problem. The current episode started more than 1 month ago. The problem has been waxing and waning since onset. The affected locations include the left wrist, right elbow and left foot. The rash is characterized by itchiness and redness. She was exposed to nothing. Pertinent negatives include no anorexia, congestion, diarrhea, facial edema, fatigue, fever, joint pain or nail changes. Past treatments include topical steroids. The treatment provided mild relief. There is no history of allergies or asthma.   Also reports new R instep foot pain since starting statin, no swelling or injury Has seen podiatry>advised surg repair but pt wants to try off med before agreeing to surgery  Past Medical History  Diagnosis Date  . Insomnia   . Adenomatous polyp of colon   . Overweight   . Symptomatic menopausal or female climacteric states   . Disorder of bone and cartilage, unspecified   . Other and unspecified hyperlipidemia   . Osteoarthrosis, unspecified whether generalized or localized, unspecified site   . Depressive disorder, not elsewhere classified   . Anxiety state, unspecified     Review of Systems  Constitutional: Negative for fever and fatigue.  HENT: Negative for congestion.   Gastrointestinal: Negative for diarrhea and anorexia.  Musculoskeletal: Negative for joint pain.  Skin: Positive for rash. Negative for nail changes.       Objective:   Physical Exam BP 120/62  Pulse 80  Temp(Src) 97 F (36.1 C) (Oral)  Ht 5' 5.5" (1.664 m)  Wt 153 lb 12.8 oz (69.763 kg)  BMI 25.20 kg/m2  SpO2 96% Wt Readings from Last 3 Encounters:  06/08/11 153 lb 12.8 oz (69.763 kg)  05/15/11 151 lb (68.493 kg)  03/05/11 150 lb (68.04 kg)   Gen: ND, pleasant and nontoxic Lung: CTA B CV: RRR Skin: eczema changes finger webspace B hands - faded outbreak L wrist and elbow fold and  bright red circular rash at r elbow fold - signs of excoriation - also diffuse winter dermatitis dry skin changes BUE Mskel - R foot not examined      Assessment & Plan:  Eczema - unclear trigger denies new medications or exposure. Will treat with topical steroids 2x/day -   R foot pain - "numbness" isolated to instep/ball of foot - ?neuroma - explained my opinion this is unlikely related to statin but okay for drug holiday for one week. Patient to followup with primary care physician on same -

## 2011-07-01 ENCOUNTER — Encounter: Payer: Self-pay | Admitting: Internal Medicine

## 2011-07-01 ENCOUNTER — Ambulatory Visit (INDEPENDENT_AMBULATORY_CARE_PROVIDER_SITE_OTHER): Payer: Managed Care, Other (non HMO) | Admitting: Internal Medicine

## 2011-07-01 VITALS — BP 144/82 | HR 70 | Temp 97.2°F | Resp 16 | Wt 153.0 lb

## 2011-07-01 DIAGNOSIS — M79673 Pain in unspecified foot: Secondary | ICD-10-CM

## 2011-07-01 DIAGNOSIS — L309 Dermatitis, unspecified: Secondary | ICD-10-CM

## 2011-07-01 DIAGNOSIS — L259 Unspecified contact dermatitis, unspecified cause: Secondary | ICD-10-CM

## 2011-07-01 DIAGNOSIS — E785 Hyperlipidemia, unspecified: Secondary | ICD-10-CM

## 2011-07-01 DIAGNOSIS — M79609 Pain in unspecified limb: Secondary | ICD-10-CM

## 2011-07-01 MED ORDER — CLOTRIMAZOLE-BETAMETHASONE 1-0.05 % EX CREA: TOPICAL_CREAM | Freq: Two times a day (BID) | CUTANEOUS | Status: DC | PRN

## 2011-07-01 NOTE — Patient Instructions (Addendum)
It was good to see you today. Start Lotrisone in place of triamcinolone cream for your hand rash and itch - Your prescription(s) have been submitted to your pharmacy. Please take as directed and contact our office if you believe you are having problem(s) with the medication(s). Resume 1/2 dose cholesterol medication for one week to see if this causes recurrent foot pain. Eczema Atopic dermatitis, or eczema, is an inherited type of sensitive skin. Often people with eczema have a family history of allergies, asthma, or hay fever. It causes a red itchy rash and dry scaly skin. The itchiness may occur before the skin rash and may be very intense. It is not contagious. Eczema is generally worse during the cooler winter months and often improves with the warmth of summer. Eczema usually starts showing signs in infancy. Some children outgrow eczema, but it may last through adulthood. Flare-ups may be caused by:  Eating something or contact with something you are sensitive or allergic to.   Stress.  DIAGNOSIS   The diagnosis of eczema is usually based upon symptoms and medical history. TREATMENT   Eczema cannot be cured, but symptoms usually can be controlled with treatment or avoidance of allergens (things to which you are sensitive or allergic to).  Controlling the itching and scratching.   Use over-the-counter antihistamines as directed for itching. It is especially useful at night when the itching tends to be worse.   Use over-the-counter steroid creams as directed for itching.   Scratching makes the rash and itching worse and may cause impetigo (a skin infection) if fingernails are contaminated (dirty).   Keeping the skin well moisturized with creams every day. This will seal in moisture and help prevent dryness. Lotions containing alcohol and water can dry the skin and are not recommended.   Limiting exposure to allergens.   Recognizing situations that cause stress.   Developing a plan to  manage stress.  HOME CARE INSTRUCTIONS    Take prescription and over-the-counter medicines as directed by your caregiver.   Do not use anything on the skin without checking with your caregiver.   Keep baths or showers short (5 minutes) in warm (not hot) water. Use mild cleansers for bathing. You may add non-perfumed bath oil to the bath water. It is best to avoid soap and bubble bath.   Immediately after a bath or shower, when the skin is still damp, apply a moisturizing ointment to the entire body. This ointment should be a petroleum ointment. This will seal in moisture and help prevent dryness. The thicker the ointment the better. These should be unscented.   Keep fingernails cut short and wash hands often. If your child has eczema, it may be necessary to put soft gloves or mittens on your child at night.   Dress in clothes made of cotton or cotton blends. Dress lightly, as heat increases itching.   Avoid foods that may cause flare-ups. Common foods include cow's milk, peanut butter, eggs and wheat.   Keep a child with eczema away from anyone with fever blisters. The virus that causes fever blisters (herpes simplex) can cause a serious skin infection in children with eczema.  SEEK MEDICAL CARE IF:    Itching interferes with sleep.   The rash gets worse or is not better within one week following treatment.   The rash looks infected (pus or soft yellow scabs).   You or your child has an oral temperature above 102 F (38.9 C).   Your baby is  older than 3 months with a rectal temperature of 100.5 F (38.1 C) or higher for more than 1 day.   The rash flares up after contact with someone who has fever blisters.  SEEK IMMEDIATE MEDICAL CARE IF:    Your baby is older than 3 months with a rectal temperature of 102 F (38.9 C) or higher.   Your baby is older than 3 months or younger with a rectal temperature of 100.4 F (38 C) or higher.  Document Released: 02/28/2000 Document  Revised: 02/19/2011 Document Reviewed: 01/02/2009 [facility] Patient Information 2012 [place], [place].

## 2011-07-01 NOTE — Progress Notes (Signed)
  Subjective:    Patient ID: Mandy Durham, female    DOB: July 10, 1945, 66 y.o.   MRN: [id number]  HPI complains of continued rash and itch on hands -  symptoms worst on L hand between index/middle finger webspace slightly improved with kenalog cream 2 weeks ago  Reports R foot pain has resolved off mevacor (stopped same after 06/08/11 OV with me - see prior note) ?what to do for cholesterol treatment  Past Medical History  Diagnosis Date  . Insomnia   . Adenomatous polyp of colon   . Overweight   . Symptomatic menopausal or female climacteric states   . Disorder of bone and cartilage, unspecified   . Other and unspecified hyperlipidemia   . Osteoarthrosis, unspecified whether generalized or localized, unspecified site   . Depressive disorder, not elsewhere classified   . Anxiety state, unspecified     Review of Systems  Constitutional: Negative for fever and unexpected weight change.  Respiratory: Negative for shortness of breath and wheezing.   Musculoskeletal: Negative for myalgias and arthralgias.       Objective:   Physical Exam BP 144/82  Pulse 70  Temp(Src) 97.2 F (36.2 C) (Oral)  Resp 16  Wt 153 lb (69.4 kg)  SpO2 94%  Gen: NAD, pleasant and nontoxic Lung: CTA B CV: RRR Skin: eczema changes L hand at 2/3 finger webspace - resolution of prior involved areas at L wrist, B elbow folds - no signs of excoriation - also diffuse winter dermatitis dry skin changes B hands Mskel - R foot not examined  Lab Results  Component Value Date   WBC 4.6 07/11/2009   HGB 13.2 07/11/2009   HCT 39.1 07/11/2009   PLT 319.0 07/11/2009   GLUCOSE 98 02/12/2011   CHOL 226* 07/07/2010   TRIG 109.0 07/07/2010   HDL 123.30 07/07/2010   LDLDIRECT 79.9 07/07/2010   ALT 18 02/12/2011   ALT 18 02/12/2011   AST 23 02/12/2011   AST 23 02/12/2011   NA 141 02/12/2011   K 4.0 02/12/2011   CL 107 02/12/2011   CREATININE 0.8 02/12/2011   BUN 11 02/12/2011   CO2 29 02/12/2011   TSH 1.73  07/07/2010   HGBA1C 6.1 07/07/2010       Assessment & Plan:   Dermatitis - ?dry skin, eczema, fungal - likely multifactorial symptoms  -  most symptoms improved with kenalog since 06/08/11 OV - Will now use Lotrisone on residual outbreak on L finger web space - erx done - also recommended intense moisturizer B hands frequently  Myalgia - R foot instep - resolved off statin - see next - advised to avoid podiatry surgery at this time  Dyslipidemia - resume lower dose statin -

## 2011-07-07 ENCOUNTER — Telehealth: Payer: Self-pay | Admitting: Internal Medicine

## 2011-07-07 NOTE — Telephone Encounter (Signed)
Spoke with pt. Pt states she was told by her insurance company that the charge for the monitor done in 02/01/2013may not have been coded right. I referred pt to pt accounting [phone] for help.

## 2011-07-07 NOTE — Telephone Encounter (Signed)
New msg Pt wants to talk to you about heart monitor she had to wear in January. She said her insurance is not covering this. She wants to talk to you about this. Please call her back

## 2011-07-23 ENCOUNTER — Other Ambulatory Visit: Payer: Self-pay

## 2011-07-23 MED ORDER — CITALOPRAM HYDROBROMIDE 20 MG PO TABS: 20.0000 mg | ORAL_TABLET | Freq: Every day | ORAL | Status: DC

## 2011-07-23 MED ORDER — ZOLPIDEM TARTRATE 10 MG PO TABS: 5.0000 mg | ORAL_TABLET | Freq: Every evening | ORAL | Status: DC | PRN

## 2011-07-23 NOTE — Telephone Encounter (Signed)
Please note: pt states she takes whole tab of Zolpidem 10 mg nightly but records are for 1/2 tablet. Please clarify and advise on refill.

## 2011-07-24 NOTE — Telephone Encounter (Signed)
Rx faxed to pharmacy  

## 2011-07-31 ENCOUNTER — Encounter: Payer: Self-pay | Admitting: Internal Medicine

## 2011-07-31 ENCOUNTER — Ambulatory Visit (INDEPENDENT_AMBULATORY_CARE_PROVIDER_SITE_OTHER): Payer: Medicare Other | Admitting: Internal Medicine

## 2011-07-31 VITALS — BP 132/70 | HR 79 | Temp 98.1°F | Ht 65.0 in

## 2011-07-31 DIAGNOSIS — L84 Corns and callosities: Secondary | ICD-10-CM

## 2011-07-31 DIAGNOSIS — M79609 Pain in unspecified limb: Secondary | ICD-10-CM

## 2011-07-31 NOTE — Patient Instructions (Signed)
Corns and Calluses °Corns are small areas of thickened skin that usually occur on the top, sides, or tip of a toe. They contain a cone-shaped core with a point that can press on a nerve below. This causes pain. Calluses are areas of thickened skin that usually develop on hands, fingers, palms, soles of the feet, and heels. These are areas that experience frequent friction or pressure. °CAUSES  °Corns are usually the result of rubbing (friction) or pressure from shoes that are too tight or do not fit properly. Calluses are caused by repeated friction and pressure on the affected areas. °SYMPTOMS °· A hard growth on the skin.  °· Pain or tenderness under the skin.  °· Sometimes, redness and swelling.  °· Increased discomfort while wearing tight-fitting shoes.  °DIAGNOSIS  °Your caregiver can usually tell what the problem is by doing a physical exam. °TREATMENT  °Removing the cause of the friction or pressure is usually the only treatment needed. However, sometimes medicines can be used to help soften the hardened, thickened areas. These medicines include salicylic acid plasters and 12% ammonium lactate lotion. These medicines should only be used under the direction of your caregiver. °HOME CARE INSTRUCTIONS  °· Try to remove pressure from the affected area.  °· You may wear donut-shaped corn pads to protect your skin.  °· You may use a pumice stone or nonmetallic nail file to gently reduce the thickness of a corn.  °· Wear properly fitted footwear.  °· If you have calluses on the hands, wear gloves during activities that cause friction.  °· If you have diabetes, you should regularly examine your feet. Tell your caregiver if you notice any problems with your feet.  °SEEK IMMEDIATE MEDICAL CARE IF:  °· You have increased pain, swelling, redness, or warmth in the affected area.  °· Your corn or callus starts to drain fluid or bleeds.  °· You are not getting better, even with treatment.  °Document Released: 12/07/2003  Document Revised: 02/19/2011 Document Reviewed: 10/28/2010 °[employer]® Patient Information ©2012 [employer]. °

## 2011-07-31 NOTE — Progress Notes (Signed)
  Subjective:    Patient ID: Mandy Durham, female    DOB: 03-Feb-1946, 66 y.o.   MRN: [id number]  HPI  complains of sore spots on foot pads Present >6 months Denies injury or swelling Painful with direct pressure of walking  Past Medical History  Diagnosis Date  . Insomnia   . Adenomatous polyp of colon   . Overweight   . Symptomatic menopausal or female climacteric states   . Disorder of bone and cartilage, unspecified   . Other and unspecified hyperlipidemia   . Osteoarthrosis, unspecified whether generalized or localized, unspecified site   . Depressive disorder, not elsewhere classified   . Anxiety state, unspecified     Review of Systems  Skin: Negative for color change and rash.       Objective:   Physical Exam BP 132/70  Pulse 79  Temp(Src) 98.1 F (36.7 C) (Oral)  Ht 5\' 5"  (1.651 m)  SpO2 97% Gen: nontoxic, NAD Skin - small corns on soles of feet: L mid MC pad, R MC pad at lateral edge - no open ulceation or cellulitis   Lab Results  Component Value Date   WBC 4.6 07/11/2009   HGB 13.2 07/11/2009   HCT 39.1 07/11/2009   PLT 319.0 07/11/2009   GLUCOSE 98 02/12/2011   CHOL 226* 07/07/2010   TRIG 109.0 07/07/2010   HDL 123.30 07/07/2010   LDLDIRECT 79.9 07/07/2010   ALT 18 02/12/2011   ALT 18 02/12/2011   AST 23 02/12/2011   AST 23 02/12/2011   NA 141 02/12/2011   K 4.0 02/12/2011   CL 107 02/12/2011   CREATININE 0.8 02/12/2011   BUN 11 02/12/2011   CO2 29 02/12/2011   TSH 1.73 07/07/2010   HGBA1C 6.1 07/07/2010       Assessment & Plan:  Corns - painful on B MTP pads - Declines frozen NTG tx today  Advised OTC medicated corn pads and reduction of pressure

## 2011-12-10 ENCOUNTER — Ambulatory Visit (INDEPENDENT_AMBULATORY_CARE_PROVIDER_SITE_OTHER): Payer: Medicare Other

## 2011-12-10 DIAGNOSIS — Z23 Encounter for immunization: Secondary | ICD-10-CM

## 2012-03-30 ENCOUNTER — Other Ambulatory Visit: Payer: Self-pay | Admitting: *Deleted

## 2012-03-30 ENCOUNTER — Telehealth: Payer: Self-pay | Admitting: *Deleted

## 2012-03-30 MED ORDER — ZOLPIDEM TARTRATE 10 MG PO TABS: 5.0000 mg | ORAL_TABLET | Freq: Every evening | ORAL | Status: DC | PRN

## 2012-03-30 NOTE — Telephone Encounter (Signed)
Please send refill Rx to [facility] pharmacy for Zolpidem

## 2012-03-30 NOTE — Telephone Encounter (Signed)
Patient request refill on medication Zolpidem. Last OV 07/23/2011. Patient states she takes 1/2 tablet at bedtime. Request 90 day supply.

## 2012-06-08 ENCOUNTER — Other Ambulatory Visit (HOSPITAL_COMMUNITY): Payer: Self-pay | Admitting: Obstetrics and Gynecology

## 2012-06-08 DIAGNOSIS — Z1231 Encounter for screening mammogram for malignant neoplasm of breast: Secondary | ICD-10-CM

## 2012-06-22 ENCOUNTER — Ambulatory Visit (HOSPITAL_COMMUNITY): Payer: Medicare Other

## 2012-06-22 ENCOUNTER — Ambulatory Visit (HOSPITAL_COMMUNITY)
Admission: RE | Admit: 2012-06-22 | Discharge: 2012-06-22 | Disposition: A | Discharge status: Home / Self Care | Payer: Medicare Other | Source: Ambulatory Visit | Attending: Obstetrics and Gynecology | Admitting: Obstetrics and Gynecology

## 2012-06-22 DIAGNOSIS — Z1231 Encounter for screening mammogram for malignant neoplasm of breast: Secondary | ICD-10-CM

## 2012-07-14 ENCOUNTER — Ambulatory Visit (INDEPENDENT_AMBULATORY_CARE_PROVIDER_SITE_OTHER): Payer: Medicare Other | Admitting: Internal Medicine

## 2012-07-14 ENCOUNTER — Encounter: Payer: Self-pay | Admitting: Internal Medicine

## 2012-07-14 ENCOUNTER — Other Ambulatory Visit (INDEPENDENT_AMBULATORY_CARE_PROVIDER_SITE_OTHER): Payer: Medicare Other

## 2012-07-14 VITALS — BP 148/90 | HR 77 | Temp 97.0°F | Wt 149.4 lb

## 2012-07-14 DIAGNOSIS — E785 Hyperlipidemia, unspecified: Secondary | ICD-10-CM

## 2012-07-14 DIAGNOSIS — I10 Essential (primary) hypertension: Secondary | ICD-10-CM

## 2012-07-14 LAB — LIPID PANEL
Cholesterol: 269 mg/dL — ABNORMAL HIGH (ref 0–200)
HDL: 107.9 mg/dL (ref >39.00)
Total CHOL/HDL Ratio: 2
Triglycerides: 91 mg/dL (ref 0.0–149.0)

## 2012-07-14 LAB — BASIC METABOLIC PANEL
BUN: 15 mg/dL (ref 6–23)
Creatinine, Ser: 0.7 mg/dL (ref 0.4–1.2)
GFR: 84.31 mL/min (ref >60.00)

## 2012-07-14 MED ORDER — LOSARTAN POTASSIUM 50 MG PO TABS: 50.0000 mg | ORAL_TABLET | Freq: Every day | ORAL | Status: DC

## 2012-07-14 NOTE — Assessment & Plan Note (Signed)
On low dose statin - Recheck lipids now - adjust as needed for goal LDL<100 in setting of new co-morbid hypertension

## 2012-07-14 NOTE — Progress Notes (Signed)
Subjective:    Patient ID: Mandy Durham, female    DOB: 1945-11-14, 67 y.o.   MRN: [id number]  HPI  Patient of my partner Dr. Debby Bud, here with concerns about elevated blood pressure  Past Medical History  Diagnosis Date  . Insomnia   . Adenomatous polyp of colon   . Overweight   . Symptomatic menopausal or female climacteric states   . Disorder of bone and cartilage, unspecified   . Other and unspecified hyperlipidemia   . Osteoarthrosis, unspecified whether generalized or localized, unspecified site   . Depressive disorder, not elsewhere classified   . Anxiety state, unspecified     Review of Systems  Constitutional: Negative for fever and fatigue.  Respiratory: Negative for cough and shortness of breath.   Cardiovascular: Negative for chest pain and palpitations.  Neurological: Negative for facial asymmetry and headaches.       Objective:   Physical Exam BP 148/90  Pulse 77  Temp(Src) 97 F (36.1 C) (Oral)  Wt 149 lb 6.4 oz (67.767 kg)  BMI 24.86 kg/m2  SpO2 98% Wt Readings from Last 3 Encounters:  07/14/12 149 lb 6.4 oz (67.767 kg)  07/01/11 153 lb (69.4 kg)  06/08/11 153 lb 12.8 oz (69.763 kg)   Constitutional: She appears well-developed and well-nourished. No distress. slight stutter, chronic HENT: Head: Normocephalic and atraumatic. Ears: B TMs ok, no erythema or effusion; Nose: Nose normal. Mouth/Throat: Oropharynx is clear and moist. No oropharyngeal exudate.  Eyes: Conjunctivae and EOM are normal. Pupils are equal, round, and reactive to light. No scleral icterus.  Neck: Normal range of motion. Neck supple. No JVD present. No thyromegaly present. no carotid bruit Cardiovascular: Normal rate, regular rhythm and normal heart sounds.  No murmur heard. No BLE edema. Pulmonary/Chest: Effort normal and breath sounds normal. No respiratory distress. She has no wheezes.  Abdominal: Soft. Bowel sounds are normal. She exhibits no distension. There is no tenderness.  no masses. No renal bruit Neurological: She is alert and oriented to person, place, and time. No cranial nerve deficit. Coordination, balance, strength, speech and gait are normal.  Skin: Skin is warm and dry. No rash noted. No erythema.  Psychiatric: She has a normal mood and affect. Her behavior is normal. Judgment and thought content normal.   Lab Results  Component Value Date   WBC 4.6 07/11/2009   HGB 13.2 07/11/2009   HCT 39.1 07/11/2009   PLT 319.0 07/11/2009   GLUCOSE 98 02/12/2011   CHOL 226* 07/07/2010   TRIG 109.0 07/07/2010   HDL 123.30 07/07/2010   LDLDIRECT 79.9 07/07/2010   ALT 18 02/12/2011   ALT 18 02/12/2011   AST 23 02/12/2011   AST 23 02/12/2011   NA 141 02/12/2011   K 4.0 02/12/2011   CL 107 02/12/2011   CREATININE 0.8 02/12/2011   BUN 11 02/12/2011   CO2 29 02/12/2011   TSH 1.73 07/07/2010   HGBA1C 6.1 07/07/2010       Assessment & Plan:   hypertension - relatively new onset - SBP 140-60 in past 2 months at various doctor's office visits (gyn) -  Off estrogen x 2 months, on SSRI but no other med changes No diet or weight changes, no FH hypertension Asymptomatic   Check labs - BMet, lipid - send for TSH recently done at gyn Start ARB - losartan 50 qd - we reviewed potential risk/benefit and possible side effects - pt understands and agrees to same   follow up 2 weeks to  review 

## 2012-07-14 NOTE — Patient Instructions (Signed)
It was good to see you today. We have reviewed your prior records including labs and tests today we will send to your prior provider(s) for "release of records" on TSH and vitamin D labs done by gynecology as discussed today. Test(s) ordered today. Your results will be released to MyChart (or called to you) after review, usually within 72hours after test completion. If any changes need to be made, you will be notified at that same time. Start losartan 50 mg once daily for high blood pressure control. Your prescription(s) have been submitted to your pharmacy. Please take as directed and contact our office if you believe you are having problem(s) with the medication(s). Other medications reviewed and updated, no additional changes recommended Followup in 2 weeks to recheck blood pressure and review medications, call sooner if problems  Hypertension As your heart beats, it forces blood through your arteries. This force is your blood pressure. If the pressure is too high, it is called hypertension (HTN) or high blood pressure. HTN is dangerous because you may have it and not know it. High blood pressure may mean that your heart has to work harder to pump blood. Your arteries may be narrow or stiff. The extra work puts you at risk for heart disease, stroke, and other problems.  Blood pressure consists of two numbers, a higher number over a lower, 110/72, for example. It is stated as "110 over 72." The ideal is below 120 for the top number (systolic) and under 80 for the bottom (diastolic). Write down your blood pressure today. You should pay close attention to your blood pressure if you have certain conditions such as:  Heart failure.  Prior heart attack.  Diabetes  Chronic kidney disease.  Prior stroke.  Multiple risk factors for heart disease. To see if you have HTN, your blood pressure should be measured while you are seated with your arm held at the level of the heart. It should be measured at  least twice. A one-time elevated blood pressure reading (especially in the Emergency Department) does not mean that you need treatment. There may be conditions in which the blood pressure is different between your right and left arms. It is important to see your caregiver soon for a recheck. Most people have essential hypertension which means that there is not a specific cause. This type of high blood pressure may be lowered by changing lifestyle factors such as:  Stress.  Smoking.  Lack of exercise.  Excessive weight.  Drug/tobacco/alcohol use.  Eating less salt. Most people do not have symptoms from high blood pressure until it has caused damage to the body. Effective treatment can often prevent, delay or reduce that damage. TREATMENT  When a cause has been identified, treatment for high blood pressure is directed at the cause. There are a large number of medications to treat HTN. These fall into several categories, and your caregiver will help you select the medicines that are best for you. Medications may have side effects. You should review side effects with your caregiver. If your blood pressure stays high after you have made lifestyle changes or started on medicines,   Your medication(s) may need to be changed.  Other problems may need to be addressed.  Be certain you understand your prescriptions, and know how and when to take your medicine.  Be sure to follow up with your caregiver within the time frame advised (usually within two weeks) to have your blood pressure rechecked and to review your medications.  If  you are taking more than one medicine to lower your blood pressure, make sure you know how and at what times they should be taken. Taking two medicines at the same time can result in blood pressure that is too low. SEEK IMMEDIATE MEDICAL CARE IF:  You develop a severe headache, blurred or changing vision, or confusion.  You have unusual weakness or numbness, or a faint  feeling.  You have severe chest or abdominal pain, vomiting, or breathing problems. MAKE SURE YOU:   Understand these instructions.  Will watch your condition.  Will get help right away if you are not doing well or get worse. Document Released: 03/02/2005 Document Revised: 05/25/2011 Document Reviewed: 10/21/2007 [facility] Patient Information 2013 [place], [place].

## 2012-07-27 ENCOUNTER — Encounter: Payer: Self-pay | Admitting: Internal Medicine

## 2012-07-27 ENCOUNTER — Ambulatory Visit (INDEPENDENT_AMBULATORY_CARE_PROVIDER_SITE_OTHER): Payer: Medicare Other | Admitting: Internal Medicine

## 2012-07-27 ENCOUNTER — Ambulatory Visit: Payer: Medicare Other | Admitting: Internal Medicine

## 2012-07-27 VITALS — BP 148/82 | HR 68 | Temp 97.3°F

## 2012-07-27 DIAGNOSIS — M5430 Sciatica, unspecified side: Secondary | ICD-10-CM

## 2012-07-27 DIAGNOSIS — E785 Hyperlipidemia, unspecified: Secondary | ICD-10-CM

## 2012-07-27 DIAGNOSIS — I10 Essential (primary) hypertension: Secondary | ICD-10-CM

## 2012-07-27 DIAGNOSIS — M5432 Sciatica, left side: Secondary | ICD-10-CM

## 2012-07-27 DIAGNOSIS — N951 Menopausal and female climacteric states: Secondary | ICD-10-CM

## 2012-07-27 MED ORDER — NAPROXEN SODIUM 220 MG PO TABS: 220.0000 mg | ORAL_TABLET | Freq: Two times a day (BID) | ORAL | Status: DC

## 2012-07-27 MED ORDER — LOVASTATIN 20 MG PO TABS: 10.0000 mg | ORAL_TABLET | Freq: Every day | ORAL | Status: DC

## 2012-07-27 NOTE — Assessment & Plan Note (Signed)
Working with gynecology on same, stopped estrogen replacement February 2014 For symptomatic control despite trial of SSRI, now SNRI Advised to consider trial of herbal black cohosh - patient will consider Continue working with gynecology on same

## 2012-07-27 NOTE — Assessment & Plan Note (Signed)
relatively new onset - SBP 140-60 at various doctor's office visits (gyn) spring 2014-  Off estrogen since 04/2012, on SSRI but no other med changes No diet or weight changes, no FH hypertension Started ARB  losartan 50 qd 2 weeks ago Tolerating well, but not at goal Titrate up now to 100g qd- follow up 2 weeks to review

## 2012-07-27 NOTE — Assessment & Plan Note (Signed)
Reviewed lipids: excellent HDL, but LDL >130 Given co-morbid hypertension - advised to resume low dose statin for goal LDL <100

## 2012-07-27 NOTE — Patient Instructions (Signed)
It was good to see you today. We have reviewed your prior records including labs and tests today Increase with certain to 100 mg daily for blood pressure Resume lovastatin for cholesterol, goal LDL less than 100 Your prescription(s) have been submitted to your pharmacy. Please take as directed and contact our office if you believe you are having problem(s) with the medication(s). Try herbal black cohosh for hot flashes symptoms Back exercises for sciatica as listed below And use Aleve 1 tablet twice daily as needed for sciatica pain Followup in 2 weeks for repeat blood pressure check, call sooner if problems  Back Exercises Back exercises help treat and prevent back injuries. The goal is to increase your strength in your belly (abdominal) and back muscles. These exercises can also help with flexibility. Start these exercises when told by your doctor. HOME CARE Back exercises include: Pelvic Tilt.  Lie on your back with your knees bent. Tilt your pelvis until the lower part of your back is against the floor. Hold this position 5 to 10 sec. Repeat this exercise 5 to 10 times. Knee to Chest.  Pull 1 knee up against your chest and hold for 20 to 30 seconds. Repeat this with the other knee. This may be done with the other leg straight or bent, whichever feels better. Then, pull both knees up against your chest. Sit-Ups or Curl-Ups.  Bend your knees 90 degrees. Start with tilting your pelvis, and do a partial, slow sit-up. Only lift your upper half 30 to 45 degrees off the floor. Take at least 2 to 3 seonds for each sit-up. Do not do sit-ups with your knees out straight. If partial sit-ups are difficult, simply do the above but with only tightening your belly (abdominal) muscles and holding it as told. Hip-Lift.  Lie on your back with your knees flexed 90 degrees. Push down with your feet and shoulders as you raise your hips 2 inches off the floor. Hold for 10 seconds, repeat 5 to 10 times. Back  Arches.  Lie on your stomach. Prop yourself up on bent elbows. Slowly press on your hands, causing an arch in your low back. Repeat 3 to 5 times. Shoulder-Lifts.  Lie face down with arms beside your body. Keep hips and belly pressed to floor as you slowly lift your head and shoulders off the floor. Do not overdo your exercises. Be careful in the beginning. Exercises may cause you some mild back discomfort. If the pain lasts for more than 15 minutes, stop the exercises until you see your doctor. Improvement with exercise for back problems is slow.  Document Released: 04/04/2010 Document Revised: 05/25/2011 Document Reviewed: 01/01/2011 [facility] Patient Information 2013 [place], [place]. Sciatica Sciatica is a condition often seen in patients with disk disease of the lower back. Pressure on the sciatica nerve causes pain to radiate from the lower back or buttock down the leg.  CAUSES  It results from pressure on nerve roots coming out of the spine. This is often the result of a disc that deteriorates and pushes to one side. Often there is a history of back problems.  TREATMENT  In most cases sciatica improves greatly with conservative treatment (treatment that does not involve surgery). Most patients are completely better after 2-4 weeks of bed rest and other supportive care. Bed rest reduces the disc pressure greatly. Sitting is the worst position. When sitting pressure on the disc is over 5 times greater than it is while lying down. Avoid:  Bending.  Lifting.   All other activities which make the problem worse.  After the pain improves, you may continue with normal activity. Take brief periods for bed rest throughout the day until you are back to normal. Only take over-the-counter or prescription medicines for pain, discomfort, or fever as directed by your caregiver. Muscle relaxants may help by relieving spasm and providing mild sedation. Cold or heat therapy and massage may also give  significant relief. Spinal manipulation is not recommended because it can increase the degree of disc protrusion. Traction can be used in severe cases. Surgery is reserved for patients who:  Do not improve within the first months of conservative treatment.   Have signs of severe nerve root pressure.  See your doctor for follow up care as recommended. A program for back injury rehabilitation with stretching and strengthening exercises is an important part of healing.  SEEK MEDICAL CARE IF:   You notice increased pain.   You notice weakness.   You have numbness in your legs.   You have any difficulty with bladder or bowel control.  Document Released: 04/09/2004 Document Revised: 02/19/2011 Document Reviewed: 03/02/2005 [facility] Patient Information 2012 [place], [place].

## 2012-07-27 NOTE — Progress Notes (Signed)
  Subjective:    Patient ID: Mandy Durham, female    DOB: 1945-07-30, 67 y.o.   MRN: [id number]  HPI  Here for follow up - hypertension and lipids  Also has questions about how to manage chronic, mild, recurrent left-sided sciatica -  Currently, no pain, but exacerbated with prolonged sitting  Also, wishes to discuss management of hot flash symptoms. Working with gynecology on same  Past Medical History  Diagnosis Date  . Insomnia   . Adenomatous polyp of colon   . Overweight   . Symptomatic menopausal or female climacteric states   . Disorder of bone and cartilage, unspecified   . Other and unspecified hyperlipidemia   . Osteoarthrosis, unspecified whether generalized or localized, unspecified site   . Depressive disorder, not elsewhere classified   . Anxiety state, unspecified     Review of Systems  Constitutional: Negative for fever and fatigue.  Respiratory: Negative for cough and shortness of breath.   Cardiovascular: Negative for chest pain and palpitations.  Musculoskeletal: Negative for back pain and joint swelling.       Objective:   Physical Exam  BP 148/82  Pulse 68  Temp(Src) 97.3 F (36.3 C) (Oral)  SpO2 97% Wt Readings from Last 3 Encounters:  07/14/12 149 lb 6.4 oz (67.767 kg)  07/01/11 153 lb (69.4 kg)  06/08/11 153 lb 12.8 oz (69.763 kg)   Constitutional: She appears well-developed and well-nourished. No distress. slight stutter, chronic Cardiovascular: Normal rate, regular rhythm and normal heart sounds.  No murmur heard. No BLE edema. Pulmonary/Chest: Effort normal and breath sounds normal. No respiratory distress. She has no wheezes.  Musculoskeletal: Back: full range of motion of thoracic and lumbar spine. Non tender to palpation. Negative straight leg raise. DTR's are symmetrically intact. Sensation intact in all dermatomes of the lower extremities. Full strength to manual muscle testing. patient is able to heel toe walk without difficulty and  ambulates with antalgic gait. Neurological: She is alert and oriented to person, place, and time. No cranial nerve deficit. Coordination, balance, strength, speech and gait are normal.  Psychiatric: She has a normal mood and affect. Her behavior is normal. Judgment and thought content normal.   Lab Results  Component Value Date   WBC 4.6 07/11/2009   HGB 13.2 07/11/2009   HCT 39.1 07/11/2009   PLT 319.0 07/11/2009   GLUCOSE 97 07/14/2012   CHOL 269* 07/14/2012   TRIG 91.0 07/14/2012   HDL 107.90 07/14/2012   LDLDIRECT 154.8 07/14/2012   ALT 18 02/12/2011   ALT 18 02/12/2011   AST 23 02/12/2011   AST 23 02/12/2011   NA 136 07/14/2012   K 4.1 07/14/2012   CL 103 07/14/2012   CREATININE 0.7 07/14/2012   BUN 15 07/14/2012   CO2 31 07/14/2012   TSH 1.92 07/13/2012   HGBA1C 6.1 07/07/2010       Assessment & Plan:   See problem list. Medications and labs reviewed today.  Left sciatica, mild recurrent symptoms - currently controlled. Education on same, advised anti-inflammatories OTC and back strengthening exercises - discussed potential role for ESI and further imaging if symptoms severe or frequently recurrent. Patient understands and agrees to conservative symptomatic care at this time

## 2012-07-30 ENCOUNTER — Encounter (HOSPITAL_COMMUNITY): Payer: Self-pay | Admitting: Family Medicine

## 2012-07-30 ENCOUNTER — Emergency Department (HOSPITAL_COMMUNITY)
Admission: EM | Admit: 2012-07-30 | Discharge: 2012-07-30 | Disposition: A | Discharge status: Home / Self Care | Payer: Medicare Other | Attending: Emergency Medicine | Admitting: Emergency Medicine

## 2012-07-30 DIAGNOSIS — F411 Generalized anxiety disorder: Secondary | ICD-10-CM | POA: Insufficient documentation

## 2012-07-30 DIAGNOSIS — R110 Nausea: Secondary | ICD-10-CM | POA: Insufficient documentation

## 2012-07-30 DIAGNOSIS — Z79899 Other long term (current) drug therapy: Secondary | ICD-10-CM | POA: Insufficient documentation

## 2012-07-30 DIAGNOSIS — F329 Major depressive disorder, single episode, unspecified: Secondary | ICD-10-CM | POA: Insufficient documentation

## 2012-07-30 DIAGNOSIS — Z8742 Personal history of other diseases of the female genital tract: Secondary | ICD-10-CM | POA: Insufficient documentation

## 2012-07-30 DIAGNOSIS — Z860100 Personal history of colon polyps, unspecified: Secondary | ICD-10-CM | POA: Insufficient documentation

## 2012-07-30 DIAGNOSIS — E785 Hyperlipidemia, unspecified: Secondary | ICD-10-CM | POA: Insufficient documentation

## 2012-07-30 DIAGNOSIS — G4700 Insomnia, unspecified: Secondary | ICD-10-CM | POA: Insufficient documentation

## 2012-07-30 DIAGNOSIS — H539 Unspecified visual disturbance: Secondary | ICD-10-CM | POA: Insufficient documentation

## 2012-07-30 DIAGNOSIS — Z8739 Personal history of other diseases of the musculoskeletal system and connective tissue: Secondary | ICD-10-CM | POA: Insufficient documentation

## 2012-07-30 DIAGNOSIS — R42 Dizziness and giddiness: Secondary | ICD-10-CM

## 2012-07-30 DIAGNOSIS — F3289 Other specified depressive episodes: Secondary | ICD-10-CM | POA: Insufficient documentation

## 2012-07-30 MED ORDER — MECLIZINE HCL 50 MG PO TABS: 25.0000 mg | ORAL_TABLET | Freq: Three times a day (TID) | ORAL | Status: DC | PRN

## 2012-07-30 MED ORDER — MECLIZINE HCL 25 MG PO TABS
50.0000 mg | ORAL_TABLET | Freq: Once | ORAL | Status: AC
Start: 2012-07-30 — End: 2012-07-30
  Administered 2012-07-30: 50 mg via ORAL
  Filled 2012-07-30: qty 2

## 2012-07-30 NOTE — ED Notes (Signed)
Patient is alert and orientedx4.  Patient was explained discharge instructions and they understood them with no questions.  The patient's husband, Estha Few, is taking the patient home.

## 2012-07-30 NOTE — ED Notes (Signed)
Per pt sts recent increase in BP meds and started on a new medication lovastatin. sts since has had dizziness which started Friday. sts when she lies down it improves for a few minutes. sts hard to focus. deneis any other associated symptoms.

## 2012-07-30 NOTE — ED Notes (Signed)
Patient says the medication change her PCP ordered is what has her dizzy and nauseated.  The patient says it helps if she closes her eyes. She does have nausea but denies vomiting. She also denies any pain.

## 2012-07-30 NOTE — ED Provider Notes (Signed)
History     CSN: [id number]  Arrival date & time 07/30/12  1607   First MD Initiated Contact with Patient 07/30/12 1738      Chief Complaint  Patient presents with  . Dizziness    (Consider location/radiation/quality/duration/timing/severity/associated sxs/prior treatment) HPI Comments: Patient is a 67 year old female who presents for dizziness with onset yesterday afternoon. Patient describes the dizziness as "hazy vision" and states that it is intermittent and aggravated with prolonged periods of focus. Dizziness is alleviated by closing her eyes or while sleeping. Patient admits to associated nausea and states that symptoms began shortly after her daily dose of losartan was increased to 100 mg daily and she was started on lovastatin. Patient denies fever, vision loss, tinnitus, difficulty speaking or swallowing, chest pain, shortness of breath, vomiting or diarrhea, abdominal pain, weakness, and numbness or tingling in her extremities.  Patient states she had her vision checked 1 month ago and prescription lenses renewed. PCP - Dr. Debby Bud whose office she called this AM and instructed her to come to ED for eval.  The history is provided by the patient. No language interpreter was used.    Past Medical History  Diagnosis Date  . Insomnia   . Adenomatous polyp of colon   . Overweight   . Symptomatic menopausal or female climacteric states   . Disorder of bone and cartilage, unspecified   . Other and unspecified hyperlipidemia   . Osteoarthrosis, unspecified whether generalized or localized, unspecified site   . Depressive disorder, not elsewhere classified   . Anxiety state, unspecified     Past Surgical History  Procedure Laterality Date  . Tubal ligation      Family History  Problem Relation Age of Onset  . Heart failure Mother   . Coronary artery disease Mother   . Dementia Mother   . Colon cancer Neg Hx   . Breast cancer Neg Hx     History  Substance Use Topics   . Smoking status: Never Smoker   . Smokeless tobacco: Never Used  . Alcohol Use: No    OB History   Grav Para Term Preterm Abortions TAB SAB Ect Mult Living                  Review of Systems  Eyes: Positive for visual disturbance.  Gastrointestinal: Positive for nausea.  Neurological: Positive for dizziness.  All other systems reviewed and are negative.    Allergies  Review of patient's allergies indicates no known allergies.  Home Medications   Current Outpatient Rx  Name  Route  Sig  Dispense  Refill  . Calcium Carbonate-Vitamin D (CALTRATE 600+D PO)   Oral   Take 1 tablet by mouth 2 (two) times daily.         Marland Kitchen losartan (COZAAR) 50 MG tablet   Oral   Take 100 mg by mouth See admin instructions. Wednesday, and Thursday   90 tablet   3   . Multiple Vitamins-Minerals (HAIR/SKIN/NAILS) TABS   Oral   Take 1 tablet by mouth daily.         . naproxen sodium (ANAPROX) 220 MG tablet   Oral   Take 440 mg by mouth daily as needed (for pain).         Marland Kitchen OVER THE COUNTER MEDICATION   Oral   Take 1 capsule by mouth 4 (four) times daily.         Marland Kitchen zolpidem (AMBIEN) 10 MG tablet   Oral  Take 10 mg by mouth at bedtime as needed.         . lovastatin (MEVACOR) 20 MG tablet   Oral   Take 10 mg by mouth at bedtime.         . meclizine (ANTIVERT) 50 MG tablet   Oral   Take 0.5 tablets (25 mg total) by mouth 3 (three) times daily as needed.   30 tablet   0   . PARoxetine (PAXIL) 20 MG tablet   Oral   Take 20 mg by mouth every morning.           BP 145/88  Pulse 88  Temp(Src) 97.3 F (36.3 C)  Resp 18  SpO2 98%  Physical Exam  Nursing note and vitals reviewed. Constitutional: She is oriented to person, place, and time. She appears well-developed and well-nourished. No distress.  HENT:  Head: Normocephalic and atraumatic.  Nose: Nose normal.  Mouth/Throat: Oropharynx is clear and moist. No oropharyngeal exudate.  Eyes: Conjunctivae and EOM  are normal. Pupils are equal, round, and reactive to light. Right eye exhibits no discharge. Left eye exhibits no discharge. No scleral icterus.  Neck: Normal range of motion. Neck supple.  Cardiovascular: Normal rate, regular rhythm, normal heart sounds and intact distal pulses.   Pulmonary/Chest: Effort normal and breath sounds normal. No respiratory distress. She has no wheezes. She has no rales.  Abdominal: Soft. She exhibits no distension and no mass. There is no tenderness. There is no rebound and no guarding.  Musculoskeletal: Normal range of motion. She exhibits no edema.  Lymphadenopathy:    She has no cervical adenopathy.  Neurological: She is alert and oriented to person, place, and time. She has normal reflexes.  GCS 15. Cranial nerves II through XII grossly intact without sensory or motor deficits. Patient exhibits equal grip strength bilaterally with 5 out of 5 strength against resistance in her upper and lower extremities. DTRs normal and symmetric.  Skin: Skin is warm and dry. No rash noted. She is not diaphoretic. No erythema. No pallor.  Psychiatric: She has a normal mood and affect. Her behavior is normal.    ED Course  Procedures (including critical care time)  Labs Reviewed - No data to display No results found.   1. Dizziness     MDM  Patient presents for intermittent dizziness with onset yesterday. Patient states she was recently started on lovastatin and had her losartan dose increased from 50 mg to 100 mg daily. Patient is not orthostatic in ED today. On physical exam she is neurovascularly intact with no sensory or motor deficits. She is calm, well appearing, in no acute distress, and speaks in full goal oriented sentences. Do not believe further workup with imaging is oriented at this time given physical exam findings. Will check visual acuity and dose of Antivert ordered.  Visual acuity 20/35 OU and 20/30 OD, OS. Patient ambulates in hall without difficulty. She  has remained hemodynamically stable and in no acute distress. Appropriate for discharge with primary care followup for further evaluation of symptoms. Patient advised to discontinue lovastatin until otherwise instructed by her primary care provider. Patient also given meclizine to take as needed for dizziness. Indications for ED return provided. Patient states comfort and understanding with this discharge plan with no unaddressed concerns. Patient seen also by Dr. Adriana Simas who is in agreement with this workup and management plan.   Filed Vitals:   07/30/12 1618 07/30/12 1740 07/30/12 1742 07/30/12 1743  BP: 167/94 148/70  148/71 145/88  Pulse: 77 74 78 88  Temp: 97.3 F (36.3 C)     Resp: 18     SpO2: 98%            Antony Madura, PA-C 07/30/12 2023

## 2012-07-31 NOTE — ED Provider Notes (Signed)
Medical screening examination/treatment/procedure(s) were conducted as a shared visit with non-physician practitioner(s) and myself.  I personally evaluated the patient during the encounter.  No obvious neuro deficits. Will discontinue her statin and prescribed meclizine. She has primary care followup  Donnetta Hutching, MD 07/31/12 1200

## 2012-08-02 ENCOUNTER — Ambulatory Visit (INDEPENDENT_AMBULATORY_CARE_PROVIDER_SITE_OTHER): Payer: Medicare Other | Admitting: Internal Medicine

## 2012-08-02 ENCOUNTER — Encounter: Payer: Self-pay | Admitting: Internal Medicine

## 2012-08-02 VITALS — BP 148/90 | HR 86 | Temp 97.3°F

## 2012-08-02 DIAGNOSIS — I10 Essential (primary) hypertension: Secondary | ICD-10-CM

## 2012-08-02 DIAGNOSIS — R42 Dizziness and giddiness: Secondary | ICD-10-CM

## 2012-08-02 DIAGNOSIS — E785 Hyperlipidemia, unspecified: Secondary | ICD-10-CM

## 2012-08-02 MED ORDER — HYDROCHLOROTHIAZIDE 12.5 MG PO TABS: 12.5000 mg | ORAL_TABLET | Freq: Every day | ORAL | Status: DC

## 2012-08-02 NOTE — Patient Instructions (Signed)
It was good to see you today. We have reviewed your ER visit and records including labs and tests today Medications reviewed and updated: Hold lovastatin until further notice Hold black cohosh until further notice Reduce losartan to 50 mg tablet once daily Start hydrochlorothiazide 12.5 mg once each a.m. - Your prescription(s) have been submitted to your pharmacy. Please take as directed and contact our office if you believe you are having problem(s) with the medication(s). Keep followup may 28 as scheduled for blood pressure recheck, please call sooner if problems  Okay to use meclizine as needed for dizziness symptoms if they recur

## 2012-08-02 NOTE — Assessment & Plan Note (Signed)
BP Readings from Last 3 Encounters:  08/02/12 148/90  07/30/12 145/88  07/27/12 148/82   relatively new onset - SBP 140-60 at various doctor's office visits (gyn) spring 2014-  Off estrogen since 04/2012, on SSRI but no other med changes No diet or weight changes, no FH hypertension Started ARB losartan 3 weeks ago Tolerating well, but not at goal even at max ARB dose Add HCTZ now follow up 2 weeks to review

## 2012-08-02 NOTE — Progress Notes (Signed)
  Subjective:    Patient ID: Mandy Durham, female    DOB: 1946-03-11, 67 y.o.   MRN: [id number]  HPI  Here for follow up - hypertension and hot flashes -  Also ER visit this week reviewed for dizziness symptoms, no recurrence   Past Medical History  Diagnosis Date  . Insomnia   . Adenomatous polyp of colon   . Overweight   . Symptomatic menopausal or female climacteric states   . Hyperlipidemia   . Osteoarthrosis, unspecified whether generalized or localized, unspecified site   . Depressive disorder, not elsewhere classified   . Anxiety state, unspecified   . Hypertension     Review of Systems  Constitutional: Negative for fever and fatigue.  Respiratory: Negative for cough and shortness of breath.   Cardiovascular: Negative for chest pain and palpitations.  Musculoskeletal: Negative for back pain and joint swelling.       Objective:   Physical Exam  BP 148/90  Pulse 86  Temp(Src) 97.3 F (36.3 C) (Oral)  SpO2 94% Wt Readings from Last 3 Encounters:  07/14/12 149 lb 6.4 oz (67.767 kg)  07/01/11 153 lb (69.4 kg)  06/08/11 153 lb 12.8 oz (69.763 kg)   Constitutional: She appears well-developed and well-nourished. No distress. slight stutter, chronic Cardiovascular: Normal rate, regular rhythm and normal heart sounds.  No murmur heard. No BLE edema. Pulmonary/Chest: Effort normal and breath sounds normal. No respiratory distress. She has no wheezes.  Psychiatric: She has a normal mood and affect. Her behavior is normal. Judgment and thought content normal.   Lab Results  Component Value Date   WBC 4.6 07/11/2009   HGB 13.2 07/11/2009   HCT 39.1 07/11/2009   PLT 319.0 07/11/2009   GLUCOSE 97 07/14/2012   CHOL 269* 07/14/2012   TRIG 91.0 07/14/2012   HDL 107.90 07/14/2012   LDLDIRECT 154.8 07/14/2012   ALT 18 02/12/2011   ALT 18 02/12/2011   AST 23 02/12/2011   AST 23 02/12/2011   NA 136 07/14/2012   K 4.1 07/14/2012   CL 103 07/14/2012   CREATININE 0.7 07/14/2012   BUN 15  07/14/2012   CO2 31 07/14/2012   TSH 1.92 07/13/2012   HGBA1C 6.1 07/07/2010       Assessment & Plan:   See problem list. Medications and labs reviewed today.  Dizziness - T1 through visit for same May 17 reviewed. No abnormalities found, symptoms have since resolved with use of when necessary meclizine as prescribed. Patient believes related to "too many medicines"  -will hold on statin and black cohosh until blood pressure medication stabilized, making only one medication change at a time

## 2012-08-02 NOTE — Assessment & Plan Note (Signed)
Reviewed lipids: excellent HDL, but LDL >130 Given co-morbid hypertension, advised mid May to resume low dose statin for goal LDL <100 However, ED visit for dizziness 5 days after starting statin reviewed -  will continue to hold statin for now as pt concerned dizzy is side effects of statin

## 2012-08-05 ENCOUNTER — Telehealth: Payer: Self-pay

## 2012-08-05 NOTE — Telephone Encounter (Signed)
Phone call from [place] at [facility] pharmacy wanting to verify the directions on patients zolpidem 10 mg. Med list shows 1/2 tab daily and I called patient to verify she has been taking it this way and she states she has. Therefore patient is not yet due for a refill.

## 2012-08-10 ENCOUNTER — Encounter: Payer: Self-pay | Admitting: Internal Medicine

## 2012-08-10 ENCOUNTER — Ambulatory Visit (INDEPENDENT_AMBULATORY_CARE_PROVIDER_SITE_OTHER): Payer: Medicare Other | Admitting: Internal Medicine

## 2012-08-10 VITALS — BP 132/80 | HR 89 | Temp 96.9°F

## 2012-08-10 DIAGNOSIS — I10 Essential (primary) hypertension: Secondary | ICD-10-CM

## 2012-08-10 DIAGNOSIS — E785 Hyperlipidemia, unspecified: Secondary | ICD-10-CM

## 2012-08-10 DIAGNOSIS — G4700 Insomnia, unspecified: Secondary | ICD-10-CM

## 2012-08-10 MED ORDER — LOVASTATIN 20 MG PO TABS: 40.0000 mg | ORAL_TABLET | ORAL | Status: DC

## 2012-08-10 MED ORDER — LOVASTATIN 20 MG PO TABS: 10.0000 mg | ORAL_TABLET | ORAL | Status: DC

## 2012-08-10 NOTE — Assessment & Plan Note (Signed)
Wishes to try OTC meds in place of rx Ambien advised to try Tylenol PM

## 2012-08-10 NOTE — Progress Notes (Signed)
  Subjective:    Patient ID: Mandy Durham, female    DOB: Jul 15, 1945, 67 y.o.   MRN: [id number]  HPI  Here for follow up - hypertension, lipids and hot flashes -    Past Medical History  Diagnosis Date  . Insomnia   . Adenomatous polyp of colon   . Overweight(278.02)   . Symptomatic menopausal or female climacteric states   . Hyperlipidemia   . Osteoarthrosis, unspecified whether generalized or localized, unspecified site   . Depressive disorder, not elsewhere classified   . Anxiety state, unspecified   . Hypertension     Review of Systems  Constitutional: Negative for fever and fatigue.  Respiratory: Negative for cough and shortness of breath.   Cardiovascular: Negative for chest pain and palpitations.  Musculoskeletal: Negative for back pain and joint swelling.       Objective:   Physical Exam  BP 132/80  Pulse 89  Temp(Src) 96.9 F (36.1 C) (Oral)  SpO2 97% Wt Readings from Last 3 Encounters:  07/14/12 149 lb 6.4 oz (67.767 kg)  07/01/11 153 lb (69.4 kg)  06/08/11 153 lb 12.8 oz (69.763 kg)   Constitutional: She appears well-developed and well-nourished. No distress. slight stutter, chronic Cardiovascular: Normal rate, regular rhythm and normal heart sounds.  No murmur heard. No BLE edema. Pulmonary/Chest: Effort normal and breath sounds normal. No respiratory distress. She has no wheezes.  Psychiatric: She has a normal mood and affect. Her behavior is normal. Judgment and thought content normal.   Lab Results  Component Value Date   WBC 4.6 07/11/2009   HGB 13.2 07/11/2009   HCT 39.1 07/11/2009   PLT 319.0 07/11/2009   GLUCOSE 97 07/14/2012   CHOL 269* 07/14/2012   TRIG 91.0 07/14/2012   HDL 107.90 07/14/2012   LDLDIRECT 154.8 07/14/2012   ALT 18 02/12/2011   ALT 18 02/12/2011   AST 23 02/12/2011   AST 23 02/12/2011   NA 136 07/14/2012   K 4.1 07/14/2012   CL 103 07/14/2012   CREATININE 0.7 07/14/2012   BUN 15 07/14/2012   CO2 31 07/14/2012   TSH 1.92 07/13/2012   HGBA1C  6.1 07/07/2010       Assessment & Plan:   See problem list. Medications and labs reviewed today.

## 2012-08-10 NOTE — Assessment & Plan Note (Signed)
Reviewed lipids: excellent HDL, but LDL >130 Given co-morbid hypertension, advised mid May to resume low dose statin for goal LDL <100 However, ED visit for dizziness 5 days after starting statin reviewed -  Ok to resume low dose (1/2 tab) qod

## 2012-08-10 NOTE — Assessment & Plan Note (Signed)
BP Readings from Last 3 Encounters:  08/10/12 132/80  08/02/12 148/90  07/30/12 145/88   relatively new onset - SBP 140-60 at various doctor's office visits (gyn) spring 2014-  Off estrogen since 04/2012, on SSRI but no other med changes No diet or weight changes, no FH hypertension Started ARB losartan 06/2012 and titrated to max dose Added HCTZ 07/2012 - improved The current medical regimen is effective;  continue present plan and medications. Consider combining meds at future visit

## 2012-08-10 NOTE — Patient Instructions (Signed)
It was good to see you today. Medications reviewed and updated: Resume 1/2 tab lovastatin every other day Hold black cohosh until further notice Okay to use meclizine as needed for dizziness symptoms if they recur Try tylenol PM in place of Ambien as needed, call if refill on Ambien as needed

## 2012-09-09 ENCOUNTER — Telehealth: Payer: Self-pay

## 2012-09-09 MED ORDER — ZOLPIDEM TARTRATE 10 MG PO TABS: 10.0000 mg | ORAL_TABLET | Freq: Every evening | ORAL | Status: DC | PRN

## 2012-09-09 NOTE — Telephone Encounter (Signed)
Notified pt rx sent to [facility] 

## 2012-09-09 NOTE — Telephone Encounter (Signed)
Phone call from patient stating she would like a prescription for Ambien. She was to call in a let you know if she changed her mind about having it. Please advise.

## 2012-09-09 NOTE — Telephone Encounter (Signed)
Pt wants the RX sent to [facility] on [address].

## 2012-09-09 NOTE — Telephone Encounter (Signed)
ok 

## 2012-09-20 ENCOUNTER — Encounter: Payer: Self-pay | Admitting: Internal Medicine

## 2012-09-20 ENCOUNTER — Ambulatory Visit (INDEPENDENT_AMBULATORY_CARE_PROVIDER_SITE_OTHER): Payer: Medicare Other | Admitting: Internal Medicine

## 2012-09-20 ENCOUNTER — Other Ambulatory Visit (INDEPENDENT_AMBULATORY_CARE_PROVIDER_SITE_OTHER): Payer: Medicare Other

## 2012-09-20 VITALS — BP 142/80 | HR 90 | Temp 97.1°F | Wt 155.6 lb

## 2012-09-20 DIAGNOSIS — I10 Essential (primary) hypertension: Secondary | ICD-10-CM

## 2012-09-20 DIAGNOSIS — R7309 Other abnormal glucose: Secondary | ICD-10-CM

## 2012-09-20 DIAGNOSIS — R739 Hyperglycemia, unspecified: Secondary | ICD-10-CM

## 2012-09-20 DIAGNOSIS — R5381 Other malaise: Secondary | ICD-10-CM

## 2012-09-20 DIAGNOSIS — R7303 Prediabetes: Secondary | ICD-10-CM | POA: Insufficient documentation

## 2012-09-20 DIAGNOSIS — R635 Abnormal weight gain: Secondary | ICD-10-CM

## 2012-09-20 DIAGNOSIS — N951 Menopausal and female climacteric states: Secondary | ICD-10-CM

## 2012-09-20 DIAGNOSIS — R5383 Other fatigue: Secondary | ICD-10-CM

## 2012-09-20 LAB — CBC WITH DIFFERENTIAL/PLATELET
Basophils Absolute: 0 10*3/uL (ref 0.0–0.1)
Eosinophils Relative: 2.8 % (ref 0.0–5.0)
HCT: 40.6 % (ref 36.0–46.0)
Hemoglobin: 13.7 g/dL (ref 12.0–15.0)
Lymphocytes Relative: 30.7 % (ref 12.0–46.0)
Monocytes Relative: 10 % (ref 3.0–12.0)
Neutro Abs: 3.5 10*3/uL (ref 1.4–7.7)
Platelets: 361 10*3/uL (ref 150.0–400.0)
RDW: 13.7 % (ref 11.5–14.6)
WBC: 6.2 10*3/uL (ref 4.5–10.5)

## 2012-09-20 LAB — BASIC METABOLIC PANEL
Calcium: 9.4 mg/dL (ref 8.4–10.5)
GFR: 80.44 mL/min (ref >60.00)
Glucose, Bld: 87 mg/dL (ref 70–99)
Potassium: 4.1 mEq/L (ref 3.5–5.1)
Sodium: 139 mEq/L (ref 135–145)

## 2012-09-20 LAB — HEPATIC FUNCTION PANEL
ALT: 38 U/L — ABNORMAL HIGH (ref 0–35)
AST: 29 U/L (ref 0–37)
Total Bilirubin: 0.3 mg/dL (ref 0.3–1.2)
Total Protein: 7 g/dL (ref 6.0–8.3)

## 2012-09-20 LAB — HEMOGLOBIN A1C: Hgb A1c MFr Bld: 6.5 % (ref 4.6–6.5)

## 2012-09-20 NOTE — Patient Instructions (Addendum)
It was good to see you today. Medications reviewed and updated: Stop paxil (paroxetine), no other chages or new medications Test(s) ordered today. Your results will be released to MyChart (or called to you) after review, usually within 72hours after test completion. If any changes need to be made, you will be notified at that same time. we'll make referral to nutritionist . Our office will contact you regarding appointment(s) once made. Please schedule followup in 6 weeks for weight check, cholesterol labs and general review - call sooner if problems. Please come to your next appointment fasting Health Recommendations for Postmenopausal Women Respected and ongoing research has looked at the most common causes of death, disability, and poor quality of life in postmenopausal women. The causes include heart disease, diseases of blood vessels, diabetes, depression, cancer, and bone loss (osteoporosis). Many things can be done to help lower the chances of developing these and other common problems: CARDIOVASCULAR DISEASE Heart Disease: A heart attack is a medical emergency. Know the signs and symptoms of a heart attack. Below are things women can do to reduce their risk for heart disease.   Do not smoke. If you smoke, quit.  Aim for a healthy weight. Being overweight causes many preventable deaths. Eat a healthy and balanced diet and drink an adequate amount of liquids.  Get moving. Make a commitment to be more physically active. Aim for 30 minutes of activity on most, if not all days of the week.  Eat for heart health. Choose a diet that is low in saturated fat and cholesterol and eliminate trans fat. Include whole grains, vegetables, and fruits. Read and understand the labels on food containers before buying.  Know your numbers. Ask your caregiver to check your blood pressure, cholesterol (total, HDL, LDL, triglycerides) and blood glucose. Work with your caregiver on improving your entire clinical  picture.  High blood pressure. Limit or stop your table salt intake (try salt substitute and food seasonings). Avoid salty foods and drinks. Read labels on food containers before buying. Eating well and exercising can help control high blood pressure. STROKE  Stroke is a medical emergency. Stroke may be the result of a blood clot in a blood vessel in the brain or by a brain hemorrhage (bleeding). Know the signs and symptoms of a stroke. To lower the risk of developing a stroke:  Avoid fatty foods.  Quit smoking.  Control your diabetes, blood pressure, and irregular heart rate. THROMBOPHLEBITIS (BLOOD CLOT) OF THE LEG  Becoming overweight and leading a stationary lifestyle may also contribute to developing blood clots. Controlling your diet and exercising will help lower the risk of developing blood clots. CANCER SCREENING  Breast Cancer: Take steps to reduce your risk of breast cancer.  You should practice "breast self-awareness." This means understanding the normal appearance and feel of your breasts and should include breast self-examination. Any changes detected, no matter how small, should be reported to your caregiver.  After age 45, you should have a clinical breast exam (CBE) every year.  Starting at age 74, you should consider having a mammogram (breast X-ray) every year.  If you have a family history of breast cancer, talk to your caregiver about genetic screening.  If you are at high risk for breast cancer, talk to your caregiver about having an MRI and a mammogram every year.  Intestinal or Stomach Cancer: Tests to consider are a rectal exam, fecal occult blood, sigmoidoscopy, and colonoscopy. Women who are high risk may need to be screened  at an earlier age and more often.  Cervical Cancer:  Beginning at age 9, you should have a Pap test every 3 years as long as the past 3 Pap tests have been normal.  If you have had past treatment for cervical cancer or a condition that  could lead to cancer, you need Pap tests and screening for cancer for at least 20 years after your treatment.  If you had a hysterectomy for a problem that was not cancer or a condition that could lead to cancer, then you no longer need Pap tests.  If you are between ages 3 and 34, and you have had normal Pap tests going back 10 years, you no longer need Pap tests.  If Pap tests have been discontinued, risk factors (such as a new sexual partner) need to be reassessed to determine if screening should be resumed.  Some medical problems can increase the chance of getting cervical cancer. In these cases, your caregiver may recommend more frequent screening and Pap tests.  Uterine Cancer: If you have vaginal bleeding after reaching menopause, you should notify your caregiver.  Ovarian cancer: Other than yearly pelvic exams, there are no reliable tests available to screen for ovarian cancer at this time except for yearly pelvic exams.  Lung Cancer: Yearly chest X-rays can detect lung cancer and should be done on high risk women, such as cigarette smokers and women with chronic lung disease (emphysema).  Skin Cancer: A complete body skin exam should be done at your yearly examination. Avoid overexposure to the sun and ultraviolet light lamps. Use a strong sun block cream when in the sun. All of these things are important in lowering the risk of skin cancer. MENOPAUSE Menopause Symptoms: Hormone therapy products are effective for treating symptoms associated with menopause:  Moderate to severe hot flashes.  Night sweats.  Mood swings.  Headaches.  Tiredness.  Loss of sex drive.  Insomnia.  Other symptoms. Hormone replacement carries certain risks, especially in older women. Women who use or are thinking about using estrogen or estrogen with progestin treatments should discuss that with their caregiver. Your caregiver will help you understand the benefits and risks. The ideal dose of  hormone replacement therapy is not known. The Food and Drug Administration (FDA) has concluded that hormone therapy should be used only at the lowest doses and for the shortest amount of time to reach treatment goals.  OSTEOPOROSIS Protecting Against Bone Loss and Preventing Fracture: If you use hormone therapy for prevention of bone loss (osteoporosis), the risks for bone loss must outweigh the risk of the therapy. Ask your caregiver about other medications known to be safe and effective for preventing bone loss and fractures. To guard against bone loss or fractures, the following is recommended:  If you are less than age 78, take 1000 mg of calcium and at least 600 mg of Vitamin D per day.  If you are greater than age 67 but less than age 35, take 1200 mg of calcium and at least 600 mg of Vitamin D per day.  If you are greater than age 84, take 1200 mg of calcium and at least 800 mg of Vitamin D per day. Smoking and excessive alcohol intake increases the risk of osteoporosis. Eat foods rich in calcium and vitamin D and do weight bearing exercises several times a week as your caregiver suggests. DIABETES Diabetes Melitus: If you have Type I or Type 2 diabetes, you should keep your blood sugar under control  with diet, exercise and recommended medication. Avoid too many sweets, starchy and fatty foods. Being overweight can make control more difficult. COGNITION AND MEMORY Cognition and Memory: Menopausal hormone therapy is not recommended for the prevention of cognitive disorders such as Alzheimer's disease or memory loss.  DEPRESSION  Depression may occur at any age, but is common in elderly women. The reasons may be because of physical, medical, social (loneliness), or financial problems and needs. If you are experiencing depression because of medical problems and control of symptoms, talk to your caregiver about this. Physical activity and exercise may help with mood and sleep. Community and  volunteer involvement may help your sense of value and worth. If you have depression and you feel that the problem is getting worse or becoming severe, talk to your caregiver about treatment options that are best for you. ACCIDENTS  Accidents are common and can be serious in the elderly woman. Prepare your house to prevent accidents. Eliminate throw rugs, place hand bars in the bath, shower and toilet areas. Avoid wearing high heeled shoes or walking on wet, snowy, and icy areas. Limit or stop driving if you have vision or hearing problems, or you feel you are unsteady with you movements and reflexes. HEPATITIS C Hepatitis C is a type of viral infection affecting the liver. It is spread mainly through contact with blood from an infected person. It can be treated, but if left untreated, it can lead to severe liver damage over years. Many people who are infected do not know that the virus is in their blood. If you are a "baby-boomer", it is recommended that you have one screening test for Hepatitis C. IMMUNIZATIONS  Several immunizations are important to consider having during your senior years, including:   Tetanus, diptheria, and pertussis booster shot.  Influenza every year before the flu season begins.  Pneumonia vaccine.  Shingles vaccine.  Others as indicated based on your specific needs. Talk to your caregiver about these. Document Released: 04/24/2005 Document Revised: 02/17/2012 Document Reviewed: 12/19/2007 [facility] Patient Information 2014 [place], [place].

## 2012-09-20 NOTE — Assessment & Plan Note (Signed)
BP Readings from Last 3 Encounters:  09/20/12 142/80  08/10/12 132/80  08/02/12 148/90   relatively new onset - SBP 140-60 at various doctor's office visits (gyn) spring 2014-  Off estrogen since 04/2012, on SSRI but no other med changes No diet or weight changes, no FH hypertension Started ARB losartan 06/2012 and titrated to max dose Added HCTZ 07/2012 - improved - but has not taken meds today The current medical regimen is generally effective;  continue present plan and medications. Consider combining meds at future visit

## 2012-09-20 NOTE — Assessment & Plan Note (Signed)
Working with gynecology on same, stopped estrogen replacement February 2014 For symptomatic control despite trial of SSRI then SNRI Also intolerant of trial of herbal black cohosh - advised to continue working with gynecology on same

## 2012-09-20 NOTE — Progress Notes (Signed)
  Subjective:    Patient ID: Mandy Durham, female    DOB: 1945-05-16, 67 y.o.   MRN: [id number]  HPI  Here for follow up - hypertension, lipids and hot flashes -  Concerned about 5# weight gain  Past Medical History  Diagnosis Date  . Insomnia   . Adenomatous polyp of colon   . Overweight(278.02)   . Symptomatic menopausal or female climacteric states   . Hyperlipidemia   . Osteoarthrosis, unspecified whether generalized or localized, unspecified site   . Depressive disorder, not elsewhere classified   . Anxiety state, unspecified   . Hypertension     Review of Systems  Constitutional: Positive for fatigue and unexpected weight change. Negative for fever.  Respiratory: Negative for cough and shortness of breath.   Cardiovascular: Negative for chest pain and palpitations.  Musculoskeletal: Negative for back pain and joint swelling.       Objective:   Physical Exam  BP 142/80  Pulse 90  Temp(Src) 97.1 F (36.2 C) (Oral)  Wt 155 lb 9.6 oz (70.58 kg)  BMI 25.89 kg/m2  SpO2 98% Wt Readings from Last 3 Encounters:  09/20/12 155 lb 9.6 oz (70.58 kg)  07/14/12 149 lb 6.4 oz (67.767 kg)  07/01/11 153 lb (69.4 kg)   Constitutional: She appears well-developed and well-nourished. No distress. slight stutter, chronic Cardiovascular: Normal rate, regular rhythm and normal heart sounds.  No murmur heard. No BLE edema. Pulmonary/Chest: Effort normal and breath sounds normal. No respiratory distress. She has no wheezes.  Psychiatric: She has a normal mood and affect. Her behavior is normal. Judgment and thought content normal.   Lab Results  Component Value Date   WBC 4.6 07/11/2009   HGB 13.2 07/11/2009   HCT 39.1 07/11/2009   PLT 319.0 07/11/2009   GLUCOSE 97 07/14/2012   CHOL 269* 07/14/2012   TRIG 91.0 07/14/2012   HDL 107.90 07/14/2012   LDLDIRECT 154.8 07/14/2012   ALT 18 02/12/2011   ALT 18 02/12/2011   AST 23 02/12/2011   AST 23 02/12/2011   NA 136 07/14/2012   K 4.1 07/14/2012    CL 103 07/14/2012   CREATININE 0.7 07/14/2012   BUN 15 07/14/2012   CO2 31 07/14/2012   TSH 1.92 07/13/2012   HGBA1C 6.1 07/07/2010       Assessment & Plan:   See problem list. Medications and labs reviewed today.  Fatigue - nonspecific symptoms/exam - check screening labs  Weight gain, greater than 5 pounds in 2 months.- History of hyperglycemia with borderline A1c Check A1c now and refer to nutritionist; okay to remain off Paxil due to potential side effect

## 2012-09-20 NOTE — Addendum Note (Signed)
Addended by: Rene Paci A on: 09/20/2012 03:30 PM   Modules accepted: Orders

## 2012-09-20 NOTE — Assessment & Plan Note (Signed)
New dx 09/2012 - a1c 6.5 Refer to endo in setting of weight gain Keep appt with nutrition as referred today

## 2012-09-28 ENCOUNTER — Other Ambulatory Visit: Payer: Self-pay

## 2012-09-28 MED ORDER — ZOLPIDEM TARTRATE 10 MG PO TABS: 10.0000 mg | ORAL_TABLET | Freq: Every evening | ORAL | Status: DC | PRN

## 2012-09-28 NOTE — Telephone Encounter (Signed)
Faxed script back to [facility].../lmb 

## 2012-09-28 NOTE — Telephone Encounter (Signed)
Pharmacy faxed a refill under Dr Debby Bud name but I see Dr Felicity Coyer is PCP

## 2012-10-04 ENCOUNTER — Encounter: Payer: Medicare Other | Attending: Internal Medicine | Admitting: *Deleted

## 2012-10-04 ENCOUNTER — Encounter: Payer: Self-pay | Admitting: *Deleted

## 2012-10-04 DIAGNOSIS — E663 Overweight: Secondary | ICD-10-CM

## 2012-10-04 DIAGNOSIS — R635 Abnormal weight gain: Secondary | ICD-10-CM | POA: Insufficient documentation

## 2012-10-04 DIAGNOSIS — E119 Type 2 diabetes mellitus without complications: Secondary | ICD-10-CM | POA: Insufficient documentation

## 2012-10-04 DIAGNOSIS — Z713 Dietary counseling and surveillance: Secondary | ICD-10-CM | POA: Insufficient documentation

## 2012-10-04 NOTE — Patient Instructions (Signed)
Plan:  Aim for 2-3 Carb Choices per meal (30-45 grams)   Aim for 0-1 Carbs per snack if hungry  Consider reading food labels for Total Carbohydrate of foods Continue with your activity level by daily as tolerated

## 2012-10-04 NOTE — Progress Notes (Signed)
  Medical Nutrition Therapy:  Appt start time: 1415 end time:  1515  Assessment:  Primary concerns today: new diagnosis of diabetes with unintentional weight gain. Lives with husband, she shops and prepares the meals. 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 She states she eats out for breakfast often, goes to gym 4 days a week after breakfast. Routinely does housework and laundry. If husband is off work, she will do errands with her husband. Church on Sundays. Eats dinner at home most nights. Has 4 grandchildren in [place] and [place].  MEDICATIONS: see list   DIETARY INTAKE:  Usual eating pattern includes 3 meals and 2-3 snacks per day.  Everyday foods include good variety of all food groups as well as some high sugar foods.  Avoided foods include: none stated.    24-hr recall:  B ( AM): eats out often- biscuit at [employer] OR English muffin with egg, bacon, tomato and mayonnaise, OR [employer] with scrambeld egg, grits without butter, raisin bread without butter, occasioanally hash browns in place of grits.,sweetened iced tea and then black coffee   Snk ( AM): occasionally fat free milk with instant coffee and pkt of flavoring with Splenda frozen L ( PM): sandwich with lean meat on wheat flat bread, couple of pieces of dark chocolate, used to have a salad and fruit but not lately, 4 oz regular Coke Snk ( PM): same as AM D ( PM): eat out often or get take out: hamburger or [employer] OR salad with meat in it OR meat, vegetables and occasionally starch, water or 4 oz Coke Snk ( PM): 1 cup popcorn, then 1 PNB cracker sandwich OR Metamucil in 8 oz OJ OR Hershey bar during the day occasionally Beverages: water, coffee, iced tea, regular soda  Usual physical activity: going to gym 4 days a week, for 50 minutes each time.  Estimated energy needs: 1400 calories 158 g carbohydrates 105 g protein 39 g fat  Progress Towards Goal(s):   In progress.   Nutritional Diagnosis:  NB-1.1 Food and nutrition-related knowledge deficit As related to new diagnosis of diabetes.  As evidenced by A1c of 6.5% on 10/02/2012.    Intervention:  Nutrition counseling and diabetes education initiated. Discussed basic physiology of diabetes, SMBG and rationale of checking BG at alternate times of day, A1c, Carb Counting and reading food labels, and benefits of increased activity.  Plan:  Aim for 2-3 Carb Choices per meal (30-45 grams)   Aim for 0-1 Carbs per snack if hungry  Consider reading food labels for Total Carbohydrate of foods Continue with your activity level by daily as tolerated  Handouts given during visit include: Living Well with Diabetes Carb Counting and Food Label handouts Meal Plan Card  Monitoring/Evaluation:  Dietary intake, exercise, reading food labels, and body weight in 4 week(s).

## 2012-10-05 ENCOUNTER — Encounter: Payer: Self-pay | Admitting: Endocrinology

## 2012-10-05 ENCOUNTER — Ambulatory Visit (INDEPENDENT_AMBULATORY_CARE_PROVIDER_SITE_OTHER): Payer: Medicare Other | Admitting: Endocrinology

## 2012-10-05 VITALS — BP 120/76 | HR 89 | Temp 98.3°F | Resp 12 | Ht 66.0 in | Wt 155.4 lb

## 2012-10-05 DIAGNOSIS — R7303 Prediabetes: Secondary | ICD-10-CM

## 2012-10-05 DIAGNOSIS — R7309 Other abnormal glucose: Secondary | ICD-10-CM

## 2012-10-05 NOTE — Progress Notes (Signed)
Patient ID: Mandy Durham, female   DOB: 08-Aug-1945, 67 y.o.   MRN: [id number]  Mandy Durham is an 67 y.o. female.   Reason for Appointment : Consultation for ? Diabetes  History of Present Illness Reason for visit endocrine consult.           Diagnosis: ?Type 2 diabetes mellitus         The patient has been followed by A1c levels and her recent A1c has increased to 6.5 compared to previous level of 6.1 about 2 years ago Review of her chemistry panels show that her blood sugars have been consistently below 100, highest 98 and recently fasting glucose is 87  She has not been following any particular diet but generally is trying to be heart healthy and eating low fat meals She has not been seen by a dietitian No distant history of weight gain No recent history of taking steroids, either orally or injectable Physical activity: exercise:4/7 days at the [facility].             Medication List       This list is accurate as of: 10/05/12  3:17 PM.  Always use your most recent med list.               acetaminophen 500 MG tablet  Commonly known as:  TYLENOL  Take 1,000 mg by mouth at bedtime as needed for pain.     CALTRATE 600 PO  Take by mouth daily.     cholecalciferol 1000 UNITS tablet  Commonly known as:  VITAMIN D  Take 1,000 Units by mouth daily.     GERITOL PO  Take by mouth.     hydrochlorothiazide 12.5 MG tablet  Commonly known as:  HYDRODIURIL  Take 1 tablet (12.5 mg total) by mouth daily.     losartan 50 MG tablet  Commonly known as:  COZAAR  Take 1 tablet (50 mg total) by mouth daily.     lovastatin 20 MG tablet  Commonly known as:  MEVACOR  Take 0.5 tablets (10 mg total) by mouth every other day.     zolpidem 10 MG tablet  Commonly known as:  AMBIEN  Take 1 tablet (10 mg total) by mouth at bedtime as needed for sleep.        Allergies: No Known Allergies  Past Medical History  Diagnosis Date  . Insomnia   . Adenomatous polyp of colon   .  Overweight(278.02)   . Symptomatic menopausal or female climacteric states   . Hyperlipidemia   . Osteoarthrosis, unspecified whether generalized or localized, unspecified site   . Depressive disorder, not elsewhere classified   . Anxiety state, unspecified   . Hypertension   . Type 2 diabetes mellitus     Past Surgical History  Procedure Laterality Date  . Tubal ligation      Family History  Problem Relation Age of Onset  . Heart failure Mother   . Coronary artery disease Mother   . Dementia Mother   . Colon cancer Neg Hx   . Breast cancer Neg Hx     Social History:  reports that she has never smoked. She has never used smokeless tobacco. She reports that she does not drink alcohol or use illicit drugs.     Review of Systems   Review of Systems  Constitutional:       Gained 5 lbs  Eyes: Negative for blurred vision.  Cardiovascular: Negative for leg swelling.  BP high since 5/14  Musculoskeletal: Positive for myalgias.       Upper arms  Neurological: Positive for sensory change.       Feet numb for several years in distal feet   Hyperlipidemia: treated with low-dose lovastatin  LABS:  Appointment on 09/20/2012  Component Date Value Range Status  . Total Bilirubin 09/20/2012 0.3  0.3 - 1.2 mg/dL Final  . Bilirubin, Direct 09/20/2012 0.0  0.0 - 0.3 mg/dL Final  . Alkaline Phosphatase 09/20/2012 74  39 - 117 U/L Final  . AST 09/20/2012 29  0 - 37 U/L Final  . ALT 09/20/2012 38* 0 - 35 U/L Final  . Total Protein 09/20/2012 7.0  6.0 - 8.3 g/dL Final  . Albumin 40/12/2723 3.8  3.5 - 5.2 g/dL Final  . Sodium 36/64/4034 139  135 - 145 mEq/L Final  . Potassium 09/20/2012 4.1  3.5 - 5.1 mEq/L Final  . Chloride 09/20/2012 103  96 - 112 mEq/L Final  . CO2 09/20/2012 35* 19 - 32 mEq/L Final  . Glucose, Bld 09/20/2012 87  70 - 99 mg/dL Final  . BUN 74/25/9563 18  6 - 23 mg/dL Final  . Creatinine, Ser 09/20/2012 0.8  0.4 - 1.2 mg/dL Final  . Calcium 87/56/4332 9.4   8.4 - 10.5 mg/dL Final  . GFR 95/18/8416 80.44  >60.00 mL/min Final  . WBC 09/20/2012 6.2  4.5 - 10.5 K/uL Final  . RBC 09/20/2012 4.70  3.87 - 5.11 Mil/uL Final  . Hemoglobin 09/20/2012 13.7  12.0 - 15.0 g/dL Final  . HCT 60/63/0160 40.6  36.0 - 46.0 % Final  . MCV 09/20/2012 86.5  78.0 - 100.0 fl Final  . MCHC 09/20/2012 33.8  30.0 - 36.0 g/dL Final  . RDW 10/93/2355 13.7  11.5 - 14.6 % Final  . Platelets 09/20/2012 361.0  150.0 - 400.0 K/uL Final  . Neutrophils Relative % 09/20/2012 56.1  43.0 - 77.0 % Final  . Lymphocytes Relative 09/20/2012 30.7  12.0 - 46.0 % Final  . Monocytes Relative 09/20/2012 10.0  3.0 - 12.0 % Final  . Eosinophils Relative 09/20/2012 2.8  0.0 - 5.0 % Final  . Basophils Relative 09/20/2012 0.4  0.0 - 3.0 % Final  . Neutro Abs 09/20/2012 3.5  1.4 - 7.7 K/uL Final  . Lymphs Abs 09/20/2012 1.9  0.7 - 4.0 K/uL Final  . Monocytes Absolute 09/20/2012 0.6  0.1 - 1.0 K/uL Final  . Eosinophils Absolute 09/20/2012 0.2  0.0 - 0.7 K/uL Final  . Basophils Absolute 09/20/2012 0.0  0.0 - 0.1 K/uL Final  . Hemoglobin A1C 09/20/2012 6.5  4.6 - 6.5 % Final   Glycemic Control Guidelines for People with Diabetes:Non Diabetic:  <6%Goal of Therapy: <7%Additional Action Suggested:  >8%     Physical Examination:  BP 120/76  Pulse 89  Temp(Src) 98.3 F (36.8 C)  Resp 12  Ht 5\' 6"  (1.676 m)  Wt 155 lb 6.4 oz (70.489 kg)  BMI 25.09 kg/m2  SpO2 98%  GENERAL:         Patient is averagely built and nourished, no abdominal obesity HEENT:         Eye exam shows normal external appearance. Fundus exam shows no retinopathy. Oral exam shows normal mucosa .  NECK:         General:  Neck exam shows no lymphadenopathy.  Thyroid is not enlarged and no nodules felt.   LUNGS:        Lungs are  clear to auscultation.Marland Kitchen   HEART:         Heart sounds:  S1 and S2 are normal. No murmurs or clicks heard., no S3 or S4.   ABDOMEN:         General:  There is no distention present. Liver and spleen  are not palpable. No other mass or tenderness present.  EXTREMITIES:     There is no edema. No skin lesions present.Marland Kitchen  NEUROLOGICAL:        Vibration sense is minimally reduced in toes. Ankle jerks are 1+ bilaterally.         Diabetic foot exam:            Inspection  Normal                    Monofilament  Normal  MUSCULOSKELETAL:       There is no enlargement or deformity of the joints. Marland Kitchen   PEDAL pulses:normal SKIN:       No rash or lesions of concern.        ASSESSMENT:  High-normal A1c with normal glucose levels Explain to her that we cannot make her diagnosis of diabetes unless her A1c is over 6.5% or she has an abnormal glucose tolerance test Unclear at this point whether she has diabetes since she has had normal fasting glucose readings May possibly have impaired glucose tolerance  PLAN:   The patient is not keen on having a glucose tolerance test and drinking 75 g of glucose for the test She agrees to have her high carbohydrate breakfast such as pancakes and syrup and come back to the office for a postprandial glucose  Currently she patient is already on a regular exercise program and has no significant obesity with BMI 25 She May need to see a dietitian if she has any abnormalities of her postprandial glucose  Oyindamola Key 10/05/2012, 3:17 PM

## 2012-10-05 NOTE — Patient Instructions (Addendum)
Have a high carb breakfast and come for sugar test about 1 1/2 hrs later

## 2012-10-19 ENCOUNTER — Ambulatory Visit (INDEPENDENT_AMBULATORY_CARE_PROVIDER_SITE_OTHER): Payer: Medicare Other | Admitting: Endocrinology

## 2012-10-19 ENCOUNTER — Encounter: Payer: Self-pay | Admitting: Endocrinology

## 2012-10-19 ENCOUNTER — Other Ambulatory Visit: Payer: Self-pay

## 2012-10-19 ENCOUNTER — Other Ambulatory Visit: Payer: Self-pay | Admitting: *Deleted

## 2012-10-19 ENCOUNTER — Ambulatory Visit: Payer: Medicare Other | Admitting: Endocrinology

## 2012-10-19 VITALS — BP 118/64 | HR 81 | Temp 97.5°F | Resp 12 | Ht 65.5 in | Wt 153.8 lb

## 2012-10-19 DIAGNOSIS — R7309 Other abnormal glucose: Secondary | ICD-10-CM

## 2012-10-19 DIAGNOSIS — R7302 Impaired glucose tolerance (oral): Secondary | ICD-10-CM

## 2012-10-19 MED ORDER — ONETOUCH DELICA LANCETS FINE MISC: 1.0000 | Status: DC

## 2012-10-19 MED ORDER — GLUCOSE BLOOD VI STRP: ORAL_STRIP | Status: DC

## 2012-10-19 NOTE — Progress Notes (Signed)
Patient ID: Mandy Durham, female   DOB: 10-Feb-1946, 67 y.o.   MRN: [id number]  Camber ALANE HANSSEN is an 67 y.o. female.   Reason for Appointment :  ? Diabetes  History of Present Illness   Diagnosis: ?Type 2 diabetes mellitus         The patient has been followed by A1c levels and her  Most recent A1c had  increased to 6.5 compared to previous level of 6.1 about 2 years ago Review of her chemistry panels show that her blood sugars have been consistently below 100, highest 98 and recently fasting glucose is 87  She has not been following any particular diet but generally is trying to be heart healthy and eating low fat meals She has not been seen by a dietitian No recent history of weight gain No recent history of taking steroids, either orally or injectable Physical activity: exercise:4/7 days at the [facility].           Since her glucose readings were normal fasting she was recommended a glucose tolerance test but she refused She was asked to eat a high carbohydrate breakfast and she had pancakes and syrup today. Blood sugar just over 2 hours after this meal is 199 in the office    Medication List       This list is accurate as of: 10/19/12 11:28 AM.  Always use your most recent med list.               acetaminophen 500 MG tablet  Commonly known as:  TYLENOL  Take 1,000 mg by mouth at bedtime as needed for pain.     CALTRATE 600 PO  Take by mouth daily.     cholecalciferol 1000 UNITS tablet  Commonly known as:  VITAMIN D  Take 1,000 Units by mouth daily.     GERITOL PO  Take by mouth.     hydrochlorothiazide 12.5 MG tablet  Commonly known as:  HYDRODIURIL  Take 1 tablet (12.5 mg total) by mouth daily.     losartan 50 MG tablet  Commonly known as:  COZAAR  Take 1 tablet (50 mg total) by mouth daily.     lovastatin 20 MG tablet  Commonly known as:  MEVACOR  Take 0.5 tablets (10 mg total) by mouth every other day.     zolpidem 10 MG tablet  Commonly known as:  AMBIEN  Take  1 tablet (10 mg total) by mouth at bedtime as needed for sleep.        Allergies: No Known Allergies  Past Medical History  Diagnosis Date  . Insomnia   . Adenomatous polyp of colon   . Overweight(278.02)   . Symptomatic menopausal or female climacteric states   . Hyperlipidemia   . Osteoarthrosis, unspecified whether generalized or localized, unspecified site   . Depressive disorder, not elsewhere classified   . Anxiety state, unspecified   . Hypertension   . Type 2 diabetes mellitus     Past Surgical History  Procedure Laterality Date  . Tubal ligation      Family History  Problem Relation Age of Onset  . Heart failure Mother   . Coronary artery disease Mother   . Dementia Mother   . Diabetes Mother   . Colon cancer Neg Hx   . Breast cancer Neg Hx   . Hypertension Neg Hx   . Heart disease Neg Hx     Social History:  reports that she has never smoked. She  has never used smokeless tobacco. She reports that she does not drink alcohol or use illicit drugs.     Review of Systems   ROS Hyperlipidemia: treated with low-dose lovastatin  LABS: PC glucose today 199  Physical Examination:  BP 118/64  Pulse 81  Temp(Src) 97.5 F (36.4 C)  Resp 12  Ht 5' 5.5" (1.664 m)  Wt 153 lb 12.8 oz (69.763 kg)  BMI 25.2 kg/m2  SpO2 97%      ASSESSMENT/PLAN:  High-normal A1c with relatively good fasting readings but appears to have a tendency to postprandial hyperglycemia Since A1c is just borderline she does not to be on medications as yet and she is reluctant to start any Discussed that she will need to modify her diet with avoiding large amounts of carbohydrate in having balanced meals She should also continue her exercise regimen and avoid weight gain Recommended that she dietitian for meal planning but she does not want to do this now She will start doing  home glucose monitoring occasionally to help identify foods that are increasing her blood sugar and modify her  diet accordingly Showed her how to use a glucose meter and frequency and timing of glucose testing  She had other questions today that were unrelated and advised her to followup with PCP   [facility] 10/19/2012, 11:28 AM

## 2012-10-19 NOTE — Patient Instructions (Addendum)
Reduce portions of carbohydrate and have protein at every meal  Please check blood sugars about 2 hours after any meal 2-3x per week.   Call if blood sugars are  >200

## 2012-10-26 ENCOUNTER — Ambulatory Visit: Payer: Medicare Other | Admitting: *Deleted

## 2012-11-01 ENCOUNTER — Encounter: Payer: Self-pay | Admitting: *Deleted

## 2012-11-01 ENCOUNTER — Encounter: Payer: Medicare Other | Attending: Internal Medicine | Admitting: *Deleted

## 2012-11-01 VITALS — Ht 66.0 in | Wt 154.2 lb

## 2012-11-01 DIAGNOSIS — Z713 Dietary counseling and surveillance: Secondary | ICD-10-CM | POA: Insufficient documentation

## 2012-11-01 DIAGNOSIS — E119 Type 2 diabetes mellitus without complications: Secondary | ICD-10-CM | POA: Insufficient documentation

## 2012-11-01 DIAGNOSIS — R635 Abnormal weight gain: Secondary | ICD-10-CM | POA: Insufficient documentation

## 2012-11-01 NOTE — Patient Instructions (Signed)
Plan:  Continue to aim for 2 Carb Choices per meal (30 grams)  +/- 1 either way Aim for 0-1 Carbs per snack if hungry  Continue reading food labels for Total Carbohydrate of foods Continue with your activity level by daily as tolerated Consider using your new meter 1-2 times a week to get information on how your blood sugars are doing

## 2012-11-01 NOTE — Progress Notes (Signed)
  Medical Nutrition Therapy:  Appt start time: 1215 end time:  1245  Assessment:  Primary concerns today: new diagnosis of diabetes with unintentional weight gain follow up visit. Weight continues to be stable at 154.2 pounds. She states she has recently been given a meter by Dr. Lucianne Muss to start SMBG, but she is not sure how often to test her BG or when.  MEDICATIONS: see list   DIETARY INTAKE:  Usual eating pattern includes 3 meals and 2-3 snacks per day.  Everyday foods include good variety of all food groups as well as some high sugar foods.  Avoided foods include: none stated.    24-hr recall:  B ( AM): eats out often- biscuit at [employer] OR English muffin with egg, bacon, tomato and mayonnaise, OR [employer] with scrambeld egg, grits without butter, raisin bread without butter, occasioanally hash browns in place of grits.,sweetened iced tea and then black coffee   Snk ( AM): occasionally fat free milk with instant coffee and pkt of flavoring with Splenda frozen L ( PM): sandwich with lean meat on wheat flat bread, couple of pieces of dark chocolate, used to have a salad and fruit but not lately, 4 oz regular Coke Snk ( PM): same as AM D ( PM): eat out often or get take out: hamburger or [employer] OR salad with meat in it OR meat, vegetables and occasionally starch, water or 4 oz Coke Snk ( PM): 1 cup popcorn, then 1 PNB cracker sandwich OR Metamucil in 8 oz OJ OR Hershey bar during the day occasionally Beverages: water, coffee, iced tea, regular soda  Usual physical activity: going to gym 4 days a week, for 50 minutes each time.  Estimated energy needs: 1400 calories 158 g carbohydrates 105 g protein 39 g fat  Progress Towards Goal(s):  In progress.   Nutritional Diagnosis:  NB-1.1 Food and nutrition-related knowledge deficit As related to new diagnosis of diabetes.  As evidenced by A1c of 6.5% on 10/02/2012.    Intervention:  Commended her on having a meter so she can see how  her eating habits and exercise effect her BG on a daily basis as desired. Encouraged her to check pre and post meal alternately and also after any exercise. Reviewed carb counting and reinforced her positive decisions.  Plan:  Continue to aim for 2 Carb Choices per meal (30 grams)  +/- 1 either way Aim for 0-1 Carbs per snack if hungry  Continue reading food labels for Total Carbohydrate of foods Continue with your activity level by daily as tolerated Consider using your new meter 1-2 times a week to get information on how your blood sugars are doing   Handouts given during visit include: no new handouts at this visit  Monitoring/Evaluation:  Dietary intake, exercise, reading food labels, and body weight PRN

## 2012-11-02 ENCOUNTER — Telehealth: Payer: Self-pay | Admitting: *Deleted

## 2012-11-02 ENCOUNTER — Ambulatory Visit (INDEPENDENT_AMBULATORY_CARE_PROVIDER_SITE_OTHER): Payer: Medicare Other | Admitting: Internal Medicine

## 2012-11-02 ENCOUNTER — Encounter: Payer: Self-pay | Admitting: Internal Medicine

## 2012-11-02 VITALS — BP 132/78 | HR 78 | Temp 97.2°F | Wt 153.4 lb

## 2012-11-02 DIAGNOSIS — Z23 Encounter for immunization: Secondary | ICD-10-CM

## 2012-11-02 DIAGNOSIS — N951 Menopausal and female climacteric states: Secondary | ICD-10-CM

## 2012-11-02 DIAGNOSIS — M67912 Unspecified disorder of synovium and tendon, left shoulder: Secondary | ICD-10-CM

## 2012-11-02 DIAGNOSIS — I10 Essential (primary) hypertension: Secondary | ICD-10-CM

## 2012-11-02 DIAGNOSIS — E785 Hyperlipidemia, unspecified: Secondary | ICD-10-CM

## 2012-11-02 DIAGNOSIS — M67919 Unspecified disorder of synovium and tendon, unspecified shoulder: Secondary | ICD-10-CM

## 2012-11-02 MED ORDER — ZOLPIDEM TARTRATE 10 MG PO TABS: 10.0000 mg | ORAL_TABLET | Freq: Every evening | ORAL | Status: DC | PRN

## 2012-11-02 MED ORDER — LOVASTATIN 20 MG PO TABS: 20.0000 mg | ORAL_TABLET | Freq: Every day | ORAL | Status: DC

## 2012-11-02 MED ORDER — PAROXETINE HCL 20 MG PO TABS: 20.0000 mg | ORAL_TABLET | ORAL | Status: DC

## 2012-11-02 MED ORDER — ZOLPIDEM TARTRATE 10 MG PO TABS: 10.0000 mg | ORAL_TABLET | Freq: Every evening | ORAL | Status: AC | PRN

## 2012-11-02 NOTE — Progress Notes (Signed)
  Subjective:    Patient ID: Mandy Durham, female    DOB: [birth year], 67 y.o.   MRN: [id number]  HPI  Here for follow up -  complains of L shoulder pain with overhead motion or reaching behind - exacerbated by gym workout  Continued hot flashes - ?resume estrogen or other med   Past Medical History  Diagnosis Date  . Insomnia   . Adenomatous polyp of colon   . Overweight(278.02)   . Symptomatic menopausal or female climacteric states   . Hyperlipidemia   . Osteoarthrosis, unspecified whether generalized or localized, unspecified site   . Depressive disorder, not elsewhere classified   . Anxiety state, unspecified   . Hypertension   . Type 2 diabetes mellitus     Review of Systems  Constitutional: Positive for fatigue. Negative for fever.  Respiratory: Negative for cough and shortness of breath.   Cardiovascular: Negative for chest pain and palpitations.  Musculoskeletal: Negative for back pain and joint swelling.       Objective:   Physical Exam  BP 132/78  Pulse 78  Temp(Src) 97.2 F (36.2 C) (Oral)  Wt 153 lb 6.4 oz (69.582 kg)  BMI 24.77 kg/m2  SpO2 98% Wt Readings from Last 3 Encounters:  11/02/12 153 lb 6.4 oz (69.582 kg)  11/01/12 154 lb 3.2 oz (69.945 kg)  10/19/12 153 lb 12.8 oz (69.763 kg)   Constitutional: She appears well-developed and well-nourished. No distress. slight stutter, chronic Cardiovascular: Normal rate, regular rhythm and normal heart sounds.  No murmur heard. No BLE edema. Pulmonary/Chest: Effort normal and breath sounds normal. No respiratory distress. She has no wheezes.  MSkel : L Shoulder: Full range of motion. Neurovascularly intact distally. Good strength with stress of rotator cuff but causes pain. Positive impingement signs. Psychiatric: She has a mildly anxious mood and affect. Her behavior is normal. Judgment and thought content normal.   Lab Results  Component Value Date   WBC 6.2 09/20/2012   HGB 13.7 09/20/2012   HCT 40.6  09/20/2012   PLT 361.0 09/20/2012   GLUCOSE 87 09/20/2012   CHOL 269* 07/14/2012   TRIG 91.0 07/14/2012   HDL 107.90 07/14/2012   LDLDIRECT 154.8 07/14/2012   ALT 38* 09/20/2012   AST 29 09/20/2012   NA 139 09/20/2012   K 4.1 09/20/2012   CL 103 09/20/2012   CREATININE 0.8 09/20/2012   BUN 18 09/20/2012   CO2 35* 09/20/2012   TSH 1.92 07/13/2012   HGBA1C 6.5 09/20/2012       Assessment & Plan:   See problem list. Medications and labs reviewed today.  L rotator cuff pain - no injury or overuse - suspect tendonitis -  Advised OTC NSAIDs and will refer to sports med for eval of same at patient request

## 2012-11-02 NOTE — Telephone Encounter (Signed)
Called pt back inform her will send to [facility]...lmb

## 2012-11-02 NOTE — Assessment & Plan Note (Signed)
Reviewed lipids: excellent HDL, but LDL >130 Given co-morbid hypertension, advised mid May 2014 to resume low dose statin for goal LDL <100 Titrate up as tolerated by side effects

## 2012-11-02 NOTE — Assessment & Plan Note (Signed)
BP Readings from Last 3 Encounters:  11/02/12 132/78  10/19/12 118/64  10/05/12 120/76  relatively new onset - SBP 140-60 at various doctor's office visits (gyn) spring 2014-  Off estrogen since 04/2012, no other med changes No diet or weight changes, no FH hypertension Started ARB losartan 06/2012 and titrated to max dose Added HCTZ 07/2012 - improved -  The current medical regimen is generally effective;  continue present plan and medications.

## 2012-11-02 NOTE — Assessment & Plan Note (Signed)
Working with gynecology on same, stopped estrogen replacement February 2014 Unsuccessful symptoms management despite trial of SSRI then SNRI (?side effects) Also intolerant of trial of herbal black cohosh - Ok to resume Paxil and advised to continue working with gynecology on same

## 2012-11-02 NOTE — Patient Instructions (Addendum)
It was good to see you today. Pneumonia vaccine updated today, flu shot in the next few weeks when available. Check with your insurance regarding coverage for shingles vaccine Medications reviewed and updated: change lovastatin to one pill every day -no other changes recommended today Refill on medication(s) as discussed today. Test(s) ordered today. Your results will be released to MyChart (or called to you) after review, usually within 72hours after test completion. If any changes need to be made, you will be notified at that same time. Work on lifestyle changes as discussed (low fat, low carb, increased protein diet; improved exercise efforts; weight loss) to control sugar, blood pressure and cholesterol levels and/or reduce risk of developing other medical problems. Look into [email or web address] or other type of food journal to assist you in this process. Please schedule followup in 6 months for weight check, a1c (diabetes) labs and general review - call sooner if problems.   Will schedule appointment for your shoulder with our sports medicine specialist Dr. Katrinka Blazing

## 2012-11-02 NOTE — Telephone Encounter (Signed)
Left msg on vm stating gave md wrong pharmacy this am. Needing zolpidem to go to [facility] & not [facility]...lmb

## 2012-11-03 ENCOUNTER — Ambulatory Visit: Payer: Medicare Other | Admitting: *Deleted

## 2012-11-03 ENCOUNTER — Other Ambulatory Visit (INDEPENDENT_AMBULATORY_CARE_PROVIDER_SITE_OTHER): Payer: Medicare Other

## 2012-11-03 DIAGNOSIS — E785 Hyperlipidemia, unspecified: Secondary | ICD-10-CM

## 2012-11-03 DIAGNOSIS — I10 Essential (primary) hypertension: Secondary | ICD-10-CM

## 2012-11-03 LAB — LIPID PANEL
HDL: 102 mg/dL (ref >39.00)
Triglycerides: 52 mg/dL (ref 0.0–149.0)
VLDL: 10.4 mg/dL (ref 0.0–40.0)

## 2012-11-07 ENCOUNTER — Ambulatory Visit (INDEPENDENT_AMBULATORY_CARE_PROVIDER_SITE_OTHER): Payer: Medicare Other | Admitting: Family Medicine

## 2012-11-07 ENCOUNTER — Telehealth: Payer: Self-pay | Admitting: Internal Medicine

## 2012-11-07 ENCOUNTER — Encounter: Payer: Self-pay | Admitting: Family Medicine

## 2012-11-07 VITALS — BP 132/78 | HR 92 | Wt 153.0 lb

## 2012-11-07 DIAGNOSIS — M7552 Bursitis of left shoulder: Secondary | ICD-10-CM | POA: Insufficient documentation

## 2012-11-07 DIAGNOSIS — M67919 Unspecified disorder of synovium and tendon, unspecified shoulder: Secondary | ICD-10-CM

## 2012-11-07 NOTE — Patient Instructions (Signed)
You have shoulder bursitis.  Start at 2# weight and increase to 4# in 2 weeks.  The after another 2 weeks go up only one day a week to 6# Do the exercises I am giving you most days of the week.  Aleve 2 pill twice a day for 3 days.  Add tylenol 325mg  with each dose as well.  Come back again 3-6 weeks to make sure you are doing better.

## 2012-11-07 NOTE — Assessment & Plan Note (Signed)
Rotator Cuff Tendinopathy  Shoulder anatomy was reviewed with the patient using and anatomical model.   Rotator cuff strengthening and scapular stabilization exercises were reviewed with the patient.  Patient advisor RTC and scapular stabilization program given to the patient.  Retraining shoulder mechanics and function was emphasized to the patient with rehab done at least 5-6 days a week.  The patient could benefit from formal PT to assist with scapular stabilization and RTC strengthening. Discussed over-the-counter anti-inflammatory use for 3 days Discussed icing protocol Patient come back again in 3-6 weeks. If not better at that point would consider doing ultrasound and likely injection.

## 2012-11-07 NOTE — Telephone Encounter (Signed)
[employer] doesn't cover Dr. Felicity Coyer in this office.  She got a $120. Bill.  [employer] said they don't cover her because she is Dr. Debby Bud Patient.  She saw Dr. Felicity Coyer while Dr. Debby Bud was out in May.

## 2012-11-07 NOTE — Progress Notes (Signed)
  I'm seeing this patient by the request  of:  Mandy Durham  CC: Left shoulder pain HPI: Patient is a very pleasant 67 year old right-hand-dominant female coming in with left shoulder pain. Patient states that she has had this pain for 2 months duration. Patient states that she did notice it when she started racing her weight in her lifting class. Patient states that it hurts more with overhead activity and reaching behind her back. Recently over the course of the last week she has decreased her weight again and it seems to be improving. Patient describes the pain as more of a sharp discomforts with certain movements and sometimes a dull aching sensation thereafterwards. Patient denies any numbness, any tingling or any radiation of pain down the extremity. Patient also denies any neck pain fevers or chills. Patient does not remember any injury. Patient has tried Aleve which has been beneficial as well. Patient states that the severity was 7/10 previously but now is down 3/10.   Past medical, surgical, family and social history reviewed. Medications reviewed all in the electronic medical record.   Review of Systems: No headache, visual changes, nausea, vomiting, diarrhea, constipation, dizziness, abdominal pain, skin rash, fevers, chills, night sweats, weight loss, swollen lymph nodes, body aches, joint swelling, muscle aches, chest pain, shortness of breath, mood changes.   Objective:    Blood pressure 132/78, pulse 92, weight 153 lb (69.4 kg), SpO2 97.00%.   General: No apparent distress alert and oriented x3 mood and affect normal, dressed appropriately.  HEENT: Pupils equal, extraocular movements intact Respiratory: Patient's speak in full sentences and does not appear short of breath Cardiovascular: No lower extremity edema, non tender, no erythema Skin: Warm dry intact with no signs of infection or rash on extremities or on axial skeleton. Abdomen: Soft nontender Neuro: Cranial nerves II through  XII are intact, neurovascularly intact in all extremities with 2+ DTRs and 2+ pulses. Lymph: No lymphadenopathy of posterior or anterior cervical chain or axillae bilaterally.  Gait normal with good balance and coordination.  MSK: Non tender with full range of motion and good stability and symmetric strength and tone of shoulders, elbows, wrist, hip, knee and ankles bilaterally. Mild increased kyphosis of the upper thoracic back Shoulder: Left Inspection reveals no abnormalities, atrophy or asymmetry. Palpation is normal with no tenderness over AC joint or bicipital groove. ROM is full in all planes with mild discomfort at the extreme end points. Rotator cuff strength normal throughout. No signs of impingement with negative Neer and Hawkin's tests, empty can sign. Speeds and Yergason's tests normal. No labral pathology noted with negative Obrien's, negative clunk and good stability. Normal scapular function observed. No painful arc and no drop arm sign. No apprehension sign   Impression and Recommendations:     This case required medical decision making of moderate complexity.

## 2012-11-07 NOTE — Telephone Encounter (Signed)
Ruby called [employer] and the patient.

## 2012-11-16 ENCOUNTER — Ambulatory Visit (INDEPENDENT_AMBULATORY_CARE_PROVIDER_SITE_OTHER): Payer: Medicare Other | Admitting: Internal Medicine

## 2012-11-16 ENCOUNTER — Encounter: Payer: Self-pay | Admitting: Internal Medicine

## 2012-11-16 VITALS — BP 128/70 | HR 83 | Temp 96.5°F | Wt 153.4 lb

## 2012-11-16 DIAGNOSIS — R55 Syncope and collapse: Secondary | ICD-10-CM

## 2012-11-16 DIAGNOSIS — I10 Essential (primary) hypertension: Secondary | ICD-10-CM

## 2012-11-16 NOTE — Progress Notes (Signed)
Subjective:    Patient ID: Mandy Durham, female    DOB: 09-Jan-1946, 67 y.o.   MRN: [id number]  HPI Mandy Durham was seen in May '14 after seeing OB/Gyn who diagnosed HTN - she saw Dr. Felicity Coyer. She was started on medication. She subsequently saw Dr. Felicity Coyer for follow up visits - there turned out to be an insurance coverage problem. She has done well on the medication that was started - no problems with tolerance.  Monday Sept 1 she feinted - fell without injury. She was arranging jewelry in her home standing for about an hour. She felt tired and weak and sat down for 5 minutes, when she stood up and felt herself going out followed by LOC for just a few minutes. No bladder or bowel incontinence.  She was fully awake in about 1 minute and was fully functional. She had eaten about two hours before the event. She has not had symptoms of feeling light-headed or dizzy associated with the new medications.  Past Medical History  Diagnosis Date  . Insomnia   . Adenomatous polyp of colon   . Overweight(278.02)   . Symptomatic menopausal or female climacteric states   . Hyperlipidemia   . Osteoarthrosis, unspecified whether generalized or localized, unspecified site   . Depressive disorder, not elsewhere classified   . Anxiety state, unspecified   . Hypertension   . Type 2 diabetes mellitus    Past Surgical History  Procedure Laterality Date  . Tubal ligation     Family History  Problem Relation Age of Onset  . Heart failure Mother   . Coronary artery disease Mother   . Dementia Mother   . Diabetes Mother   . Colon cancer Neg Hx   . Breast cancer Neg Hx   . Hypertension Neg Hx   . Heart disease Neg Hx    History   Social History  . Marital Status: Married    Spouse Name: N/A    Number of Children: N/A  . Years of Education: N/A   Occupational History  . 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 Smoking status: Never Smoker   . Smokeless  tobacco: Never Used  . Alcohol Use: No  . Drug Use: No  . Sexual Activity: Not on file   Other Topics Concern  . Not on file   Social History Narrative   HSG.  Married '65.  2 daughters, '71, '77; 4 grandchildren             Current Outpatient Prescriptions on File Prior to Visit  Medication Sig Dispense Refill  . Calcium Carbonate (CALTRATE 600 PO) Take by mouth daily.      . cholecalciferol (VITAMIN D) 1000 UNITS tablet Take 1,000 Units by mouth daily.      Marland Kitchen glucose blood (ONETOUCH VERIO) test strip Use as instructed to check blood sugars 3 times per week Dx Code 250.00  100 each  3  . hydrochlorothiazide (HYDRODIURIL) 12.5 MG tablet Take 1 tablet (12.5 mg total) by mouth daily.  30 tablet  3  . Iron-Vitamins (GERITOL PO) Take by mouth.      . losartan (COZAAR) 50 MG tablet Take 1 tablet (50 mg total) by mouth daily.  90 tablet  3  . lovastatin (MEVACOR) 20 MG tablet Take 1 tablet (20 mg total) by mouth at bedtime.  30 tablet  5  . ONETOUCH DELICA LANCETS FINE MISC 1 each by Does not apply route  3 (three) times a week.  100 each  1  . zolpidem (AMBIEN) 10 MG tablet Take 1 tablet (10 mg total) by mouth at bedtime as needed for sleep.  30 tablet  1  . [DISCONTINUED] Estradiol (ESTRACE PO) Take by mouth daily.        . [DISCONTINUED] medroxyPROGESTERone (PROVERA) 2.5 MG tablet Take 2.5 mg by mouth daily.         No current facility-administered medications on file prior to visit.      Review of Systems System review is negative for any constitutional, cardiac, pulmonary, GI or neuro symptoms or complaints other than as described in the HPI.     Objective:   Physical Exam Filed Vitals:   11/16/12 1603  BP: 128/70  Pulse: 83  Temp: 96.5 F (35.8 C)   BP Readings from Last 3 Encounters:  11/16/12 128/70  11/07/12 132/78  11/02/12 132/78   Gen'l - WNWD white woman in no distress HEENT- C&S clear Cor 2+ radial, RRR Pulm - normal respirations Neuro - A&O x 3, CN  II-XII - normal facial symmetry, PERRLA, EOMI. MS - normal get up and go. Cerebellar - no tremor, normal gait       Assessment & Plan:  Vaso-vagal syncope - explained the mechanism of both simple feint and positional vertigo. She has had minor dysequilibrium with rapid position change that is brief.   Plan No neuro testing/imaging  Call for any repeat episodes.

## 2012-11-16 NOTE — Patient Instructions (Signed)
Loss of consciousness - by description this was NOT a seizure, this was NOT a stroke, this was NOT a sudden cardiac arrythmia. This was most likely a Vaso-vagal episode related to positional vertigo. This is common, not dangerous and does not require any testing, scans or change in treatment.  Leg/ankle swelling during the course of the day is most likely venous insufficiency.   Vasovagal Syncope, Adult Syncope, commonly known as fainting, is a temporary loss of consciousness. It occurs when the blood flow to the brain is reduced. Vasovagal syncope (also called neurocardiogenic syncope) is a fainting spell in which the blood flow to the brain is reduced because of a sudden drop in heart rate and blood pressure. Vasovagal syncope occurs when the brain and the cardiovascular system (blood vessels) do not adequately communicate and respond to each other. This is the most common cause of fainting. It often occurs in response to fear or some other type of emotional or physical stress. The body has a reaction in which the heart starts beating too slowly or the blood vessels expand, reducing blood pressure. This type of fainting spell is generally considered harmless. However, injuries can occur if a person takes a sudden fall during a fainting spell.  CAUSES  Vasovagal syncope occurs when a person's blood pressure and heart rate decrease suddenly, usually in response to a trigger. Many things and situations can trigger an episode. Some of these include:   Pain.   Fear.   The sight of blood or medical procedures, such as blood being drawn from a vein.   Common activities, such as coughing, swallowing, stretching, or going to the bathroom.   Emotional stress.   Prolonged standing, especially in a warm environment.   Lack of sleep or rest.   Prolonged lack of food.   Prolonged lack of fluids.   Recent illness.  The use of certain drugs that affect blood pressure, such as cocaine,  alcohol, marijuana, inhalants, and opiates.  SYMPTOMS  Before the fainting episode, you may:   Feel dizzy or light headed.   Become pale.  Sense that you are going to faint.   Feel like the room is spinning.   Have tunnel vision, only seeing directly in front of you.   Feel sick to your stomach (nauseous).   See spots or slowly lose vision.   Hear ringing in your ears.   Have a headache.   Feel warm and sweaty.   Feel a sensation of pins and needles. During the fainting spell, you will generally be unconscious for no longer than a couple minutes before waking up and returning to normal. If you get up too quickly before your body can recover, you may faint again. Some twitching or jerky movements may occur during the fainting spell.  DIAGNOSIS  Your caregiver will ask about your symptoms, take a medical history, and perform a physical exam. Various tests may be done to rule out other causes of fainting. These may include blood tests and tests to check the heart, such as electrocardiography, echocardiography, and possibly an electrophysiology study. When other causes have been ruled out, a test may be done to check the body's response to changes in position (tilt table test). TREATMENT  Most cases of vasovagal syncope do not require treatment. Your caregiver may recommend ways to avoid fainting triggers and may provide home strategies for preventing fainting. If you must be exposed to a possible trigger, you can drink additional fluids to help reduce your chances  of having an episode of vasovagal syncope. If you have warning signs of an oncoming episode, you can respond by positioning yourself favorably (lying down). If your fainting spells continue, you may be given medicines to prevent fainting. Some medicines may help make you more resistant to repeated episodes of vasovagal syncope. Special exercises or compression stockings may be recommended. In rare cases, the surgical  placement of a pacemaker is considered. HOME CARE INSTRUCTIONS   Learn to identify the warning signs of vasovagal syncope.   Sit or lie down at the first warning sign of a fainting spell. If sitting, put your head down between your legs. If you lie down, swing your legs up in the air to increase blood flow to the brain.   Avoid hot tubs and saunas.  Avoid prolonged standing.  Drink enough fluids to keep your urine clear or pale yellow. Avoid caffeine.  Increase salt in your diet as directed by your caregiver.   If you have to stand for a long time, perform movements such as:   Crossing your legs.   Flexing and stretching your leg muscles.   Squatting.   Moving your legs.   Bending over.   Only take over-the-counter or prescription medicines as directed by your caregiver. Do not suddenly stop any medicines without asking your caregiver first. SEEK MEDICAL CARE IF:   Your fainting spells continue or happen more frequently in spite of treatment.   You lose consciousness for more than a couple minutes.  You have fainting spells during or after exercising or after being startled.   You have new symptoms that occur with the fainting spells, such as:   Shortness of breath.  Chest pain.   Irregular heartbeat.   You have episodes of twitching or jerky movements that last longer than a few seconds.  You have episodes of twitching or jerky movements without obvious fainting. SEEK IMMEDIATE MEDICAL CARE IF:   You have injuries or bleeding after a fainting spell.   You have episodes of twitching or jerky movements that last longer than 5 minutes.   You have more than one spell of twitching or jerky movements before returning to consciousness after fainting. MAKE SURE YOU:   Understand these instructions.  Will watch your condition.  Will get help right away if you are not doing well or get worse. Document Released: 02/17/2012 Document Reviewed:  01/29/2012 [facility] Patient Information 2014 [place], [place].    Venous Stasis and Chronic Venous Insufficiency As people age, the veins located in their legs may weaken and stretch. When veins weaken and lose the ability to pump blood effectively, the condition is called chronic venous insufficiency (CVI) or venous stasis. Almost all veins return blood back to the heart. This happens by:  The force of the heart pumping fresh blood pushes blood back to the heart.  Blood flowing to the heart from the force of gravity. In the deep veins of the legs, blood has to fight gravity and flow upstream back to the heart. Here, the leg muscles contract to pump blood back toward the heart. Vein walls are elastic, and many veins have small valves that only allow blood to flow in one direction. When leg muscles contract, they push inward against the elastic vein walls. This squeezes blood upward, opens the valves, and moves blood toward the heart. When leg muscles relax, the vein wall also relaxes and the valves inside the vein close to prevent blood from flowing backward. This method of pumping  blood out of the legs is called the venous pump. CAUSES  The venous pump works best while walking and leg muscles are contracting. But when a person sits or stands, blood pressure in leg veins can build. Deep veins are usually able to withstand short periods of inactivity, but long periods of inactivity (and increased pressure) can stretch, weaken, and damage vein walls. High blood pressure can also stretch and damage vein walls. The veins may no longer be able to pump blood back to the heart. Venous hypertension (high blood pressure inside veins) that lasts over time is a primary cause of CVI. CVI can also be caused by:   Deep vein thrombosis, a condition where a thrombus (blood clot) blocks blood flow in a vein.  Phlebitis, an inflammation of a superficial vein that causes a blood clot to form. Other risk factors for  CVI may include:   Heredity.  Obesity.  Pregnancy.  Sedentary lifestyle.  Smoking.  Jobs requiring long periods of standing or sitting in one place.  Age and gender:  Women in their 68's and 12's and men in their 67's are more prone to developing CVI. SYMPTOMS  Symptoms of CVI may include:   Varicose veins.  Ulceration or skin breakdown.  Lipodermatosclerosis, a condition that affects the skin just above the ankle, usually on the inside surface. Over time the skin becomes brown, smooth, tight and often painful. Those with this condition have a high risk of developing skin ulcers.  Reddened or discolored skin on the leg.  Swelling. DIAGNOSIS  Your caregiver can diagnose CVI after performing a careful medical history and physical examination. To confirm the diagnosis, the following tests may also be ordered:   Duplex ultrasound.  Plethysmography (tests blood flow).  Venograms (x-ray using a special dye). TREATMENT The goals of treatment for CVI are to restore a person to an active life and to minimize pain or disability. Typically, CVI does not pose a serious threat to life or limb, and with proper treatment most people with this condition can continue to lead active lives. In most cases, mild CVI can be treated on an outpatient basis with simple procedures. Treatment methods include:   Elastic compression socks.  Sclerotherapy, a procedure involving an injection of a material that "dissolves" the damaged veins. Other veins in the network of blood vessels take over the function of the damaged veins.  Vein stripping (an older procedure less commonly used).  Laser Ablation surgery.  Valve repair. HOME CARE INSTRUCTIONS   Elastic compression socks must be worn every day. They can help with symptoms and lower the chances of the problem getting worse, but they do not cure the problem.  Only take over-the-counter or prescription medicines for pain, discomfort, or fever  as directed by your caregiver.  Your caregiver will review your other medications with you. SEEK MEDICAL CARE IF:   You are confused about how to take your medications.  There is redness, swelling, or increasing pain in the affected area.  There is a red streak or line that extends up or down from the affected area.  There is a breakdown or loss of skin in the affected area, even if the breakdown is small.  You develop an unexplained oral temperature above 102 F (38.9 C).  There is an injury to the affected area. SEEK IMMEDIATE MEDICAL CARE IF:   There is an injury and open wound to the affected area.  Pain is not adequately relieved with pain medication prescribed  or becomes severe.  An oral temperature above 102 F (38.9 C) develops.  The foot/ankle below the affected area becomes suddenly numb or the area feels weak and hard to move. MAKE SURE YOU:   Understand these instructions.  Will watch your condition.  Will get help right away if you are not doing well or get worse. Document Released: 07/06/2006 Document Revised: 05/25/2011 Document Reviewed: 09/13/2006 [facility] Patient Information 2014 [place], [place].

## 2012-11-19 NOTE — Assessment & Plan Note (Signed)
Reviewed mechanism of feinting and of positional vertigo  Plan  reassurance.  No indication for further testing  To call for any repeat episodes.

## 2012-11-19 NOTE — Assessment & Plan Note (Signed)
BP Readings from Last 3 Encounters:  11/16/12 128/70  11/07/12 132/78  11/02/12 132/78   Stable on present medication with good control.  BMET    Component Value Date/Time   NA 139 09/20/2012 1007   K 4.1 09/20/2012 1007   CL 103 09/20/2012 1007   CO2 35* 09/20/2012 1007   GLUCOSE 87 09/20/2012 1007   BUN 18 09/20/2012 1007   CREATININE 0.8 09/20/2012 1007   CALCIUM 9.4 09/20/2012 1007   GFRNONAA 89.32 07/11/2009 0820   GFRAA 109 01/28/2007 1200    Plan  Continue present medication.

## 2012-11-30 ENCOUNTER — Ambulatory Visit: Payer: Medicare Other | Admitting: Family Medicine

## 2012-12-05 ENCOUNTER — Ambulatory Visit (INDEPENDENT_AMBULATORY_CARE_PROVIDER_SITE_OTHER): Payer: Medicare Other | Admitting: Internal Medicine

## 2012-12-05 ENCOUNTER — Other Ambulatory Visit: Payer: Self-pay | Admitting: Internal Medicine

## 2012-12-05 ENCOUNTER — Encounter: Payer: Self-pay | Admitting: Internal Medicine

## 2012-12-05 VITALS — BP 140/72 | HR 83 | Temp 97.6°F | Resp 16 | Wt 153.5 lb

## 2012-12-05 DIAGNOSIS — Z23 Encounter for immunization: Secondary | ICD-10-CM

## 2012-12-05 DIAGNOSIS — E785 Hyperlipidemia, unspecified: Secondary | ICD-10-CM

## 2012-12-05 DIAGNOSIS — I10 Essential (primary) hypertension: Secondary | ICD-10-CM

## 2012-12-05 NOTE — Patient Instructions (Addendum)
Several months of body aches, fatigue, loss of get up and go and increased emotionality. May be drugs!!  1. Blood pressure - today your blood pressure is acceptable and additional medication is not needed Plan  Leave off the losartan  Continue the HCTZ  2. Cholesterol - using the cholesterol values from May '14 (before restarting lovastatin) you cardiac risk for having an event in the next 10 years is 4%. The benefit of lowering the LDL below 160 ( it was 157 in May) is of minimal. Plan Stop lovastatin  Follow a low fat diet - google "Low Fat diet"  3. Hot Flashes and possible mild depression - I do not recommend Paxil. If any medication is needed to manage the hot flashes the drug of choice is Effexor. Plan - no new drugs at this time  If the above problems are stable we can consider a trial of Effexor.

## 2012-12-06 NOTE — Progress Notes (Signed)
  Subjective:    Patient ID: Mandy Durham, female    DOB: May 19, 1945, 67 y.o.   MRN: [id number]  HPI Mrs. Vanderpol presents with c/o myalgias and feeling out of sorts since starting on losartan in May '14. She has has also been more emotional and tearful. She has no focal complaints.  PMH, FamHx and SocHx reviewed for any changes and relevance.  Current Outpatient Prescriptions on File Prior to Visit  Medication Sig Dispense Refill  . Calcium Carbonate (CALTRATE 600 PO) Take by mouth daily.      . cholecalciferol (VITAMIN D) 1000 UNITS tablet Take 1,000 Units by mouth daily.      Marland Kitchen glucose blood (ONETOUCH VERIO) test strip Use as instructed to check blood sugars 3 times per week Dx Code 250.00  100 each  3  . Iron-Vitamins (GERITOL PO) Take by mouth.      Letta Pate DELICA LANCETS FINE MISC 1 each by Does not apply route 3 (three) times a week.  100 each  1  . [DISCONTINUED] Estradiol (ESTRACE PO) Take by mouth daily.        . [DISCONTINUED] medroxyPROGESTERone (PROVERA) 2.5 MG tablet Take 2.5 mg by mouth daily.         No current facility-administered medications on file prior to visit.      Review of Systems System review is negative for any constitutional, cardiac, pulmonary, GI or neuro symptoms or complaints other than as described in the HPI.     Objective:   Physical Exam Filed Vitals:   12/05/12 1106  BP: 140/72  Pulse: 83  Temp: 97.6 F (36.4 C)  Resp: 16   BP Readings from Last 3 Encounters:  12/05/12 140/72  11/16/12 128/70  11/07/12 132/78   General- WNWD woman in no acute distress HEENT- C&W clear, PERRLA Cor- 2+ radial, regular Pulm - normal respirations Neuro - A&O x 3, normal strength, normal gait.        Assessment & Plan:

## 2012-12-06 NOTE — Assessment & Plan Note (Signed)
ARB was started in May '14 for better BP control. However, Mandy Durham felt worse taking medication so she stopped losartan.  BP Readings from Last 3 Encounters:  12/05/12 140/72  11/16/12 128/70  11/07/12 132/78   On diuretic alone her SBP is within controlled range per JNC 8.  Plan Continue diuretic as monotherapy.  Follow monitoring.

## 2012-12-06 NOTE — Assessment & Plan Note (Signed)
Patient may have an intolerance to statins as the cause of her ill-feeling. Cardiac risk of 4% over 10 years using pretreatment HDL and current SBP. She is advised that a lower LDL may reduce her risk but she is already a low risk patient. Her pretreatment LDL was less than ACEP-ATPIII treatment threshold of 160+.  Plan Stop medical therapy  Follow a low fat diet

## 2012-12-07 ENCOUNTER — Ambulatory Visit (INDEPENDENT_AMBULATORY_CARE_PROVIDER_SITE_OTHER): Payer: Medicare Other | Admitting: Family Medicine

## 2012-12-07 ENCOUNTER — Encounter: Payer: Self-pay | Admitting: Family Medicine

## 2012-12-07 VITALS — BP 148/80 | HR 91 | Wt 154.0 lb

## 2012-12-07 DIAGNOSIS — M67919 Unspecified disorder of synovium and tendon, unspecified shoulder: Secondary | ICD-10-CM

## 2012-12-07 DIAGNOSIS — M7552 Bursitis of left shoulder: Secondary | ICD-10-CM

## 2012-12-07 NOTE — Assessment & Plan Note (Signed)
Seems to be resolving very well with conservative therapy. 3 times a week he'll continue exercises Over-the-counter anti-inflammatories as needed Heat and ice as stated in instructions Followup as needed

## 2012-12-07 NOTE — Patient Instructions (Signed)
Very good to see you! Keep doing exercises 3 times a week.  Heat before activity and ice after activity Tylenol or aleve as needed Come back and see me if it flares up again or any other problems. It has been a pleasure.

## 2012-12-07 NOTE — Progress Notes (Signed)
  Subjective:    CC: Left shoulder pain  HPI: Patient is a very pleasant 67 year old right-hand-dominant female coming in with left shoulder pain followup. At last visit patient was diagnosed with rotator cuff tendinopathy. Patient was given home exercises as well as anti-inflammatories over-the-counter. We discussed icing protocol. Since that time patient states she is approximately 50% better. Patient has been able to do all activities today living including yard work without any difficulties. Patient is sleeping comfortably. Patient has been using ice but really has not needed over-the-counter anti-inflammatories over the course last 10 days. Patient is happy with the results. Denies any new symptoms such as radiation of the hand or any weakness. Patient also denies any neck pain.  Past medical history, Surgical history, Family history not pertinant except as noted below, Social history, Allergies, and medications have been entered into the medical record, reviewed, and no changes needed.   Review of Systems: No fevers, chills, night sweats, weight loss, chest pain, or shortness of breath.   Objective:   Blood pressure 148/80, pulse 91, weight 154 lb (69.854 kg), SpO2 97.00%.  General: Well Developed, well nourished, and in no acute distress.  Neuro: Alert and oriented x3, extra-ocular muscles intact, sensation grossly intact.  HEENT: Normocephalic, atraumatic, pupils equal round reactive to light, neck supple, no masses, no lymphadenopathy, thyroid nonpalpable.  Skin: Warm and dry, no rashes. Cardiac:  no lower extremity edema. Respiratory: Not using accessory muscles, speaking in full sentences. Abdominal: NT, soft Gait: Nonantlagic, good balance and coordination Lymphatic: no lymphadenopathy in neck or axillae on palpation, non tender.  Musculoskeletal: Inspection and palpation of the right and left upper extremities including the elbows and wrist are unremarkable with full range of  motion and good muscle strength and tone. Inspection and palpation of the right and left lower extremities including the hips knees and ankles are unremarkable and nontender with full range of motion and good muscle strength and tone and are symmetric. Shoulder: Left  Inspection reveals no abnormalities, atrophy or asymmetry.  Palpation is normal with no tenderness over AC joint or bicipital groove.  ROM is full in all planes  Rotator cuff strength normal throughout.  No signs of impingement with negative Neer and Hawkin's tests, empty can sign.  Speeds and Yergason's tests normal.  No labral pathology noted with negative Obrien's, negative clunk and good stability.  Normal scapular function observed.  No painful arc and no drop arm sign.  No apprehension sign  Impression and Recommendations:

## 2012-12-14 ENCOUNTER — Telehealth: Payer: Self-pay | Admitting: Internal Medicine

## 2012-12-14 ENCOUNTER — Encounter: Payer: Self-pay | Admitting: Internal Medicine

## 2012-12-14 ENCOUNTER — Ambulatory Visit (INDEPENDENT_AMBULATORY_CARE_PROVIDER_SITE_OTHER): Payer: Medicare Other | Admitting: Internal Medicine

## 2012-12-14 VITALS — BP 144/92 | HR 91 | Temp 97.1°F | Wt 153.0 lb

## 2012-12-14 DIAGNOSIS — B029 Zoster without complications: Secondary | ICD-10-CM

## 2012-12-14 MED ORDER — VALACYCLOVIR HCL 1 G PO TABS: 1000.0000 mg | ORAL_TABLET | Freq: Three times a day (TID) | ORAL | Status: DC

## 2012-12-14 MED ORDER — PREDNISONE 10 MG PO TABS: 10.0000 mg | ORAL_TABLET | Freq: Every day | ORAL | Status: DC

## 2012-12-14 MED ORDER — GABAPENTIN 300 MG PO CAPS: 300.0000 mg | ORAL_CAPSULE | Freq: Three times a day (TID) | ORAL | Status: DC

## 2012-12-14 NOTE — Progress Notes (Signed)
  Subjective:    Patient ID: Mandy Durham, female    DOB: [birth year], 67 y.o.   MRN: [id number]  HPI Ms Vezina had the onset of pain at the left SI area Saturday and it has spread around to the lower abdomen with the eruption today of an erythematous macular/papular painful rash.  PMH, FamHx and SocHx reviewed for any changes and relevance.  Current Outpatient Prescriptions on File Prior to Visit  Medication Sig Dispense Refill  . Calcium Carbonate (CALTRATE 600 PO) Take by mouth daily.      . cholecalciferol (VITAMIN D) 1000 UNITS tablet Take 1,000 Units by mouth daily.      Marland Kitchen glucose blood (ONETOUCH VERIO) test strip Use as instructed to check blood sugars 3 times per week Dx Code 250.00  100 each  3  . hydrochlorothiazide (HYDRODIURIL) 12.5 MG tablet TAKE ONE CAPSULE BY MOUTH ONCE DAILY  30 tablet  5  . Iron-Vitamins (GERITOL PO) Take by mouth.      Letta Pate DELICA LANCETS FINE MISC 1 each by Does not apply route 3 (three) times a week.  100 each  1  . [DISCONTINUED] Estradiol (ESTRACE PO) Take by mouth daily.        . [DISCONTINUED] medroxyPROGESTERone (PROVERA) 2.5 MG tablet Take 2.5 mg by mouth daily.         No current facility-administered medications on file prior to visit.      Review of Systems System review is negative for any constitutional, cardiac, pulmonary, GI or neuro symptoms or complaints other than as described in the HPI.     Objective:   Physical Exam Filed Vitals:   12/14/12 1330  BP: 144/92  Pulse: 91  Temp: 97.1 F (36.2 C)   Gen'l - WNWD woman in no distress Cor- 2+ radial, RRR Pulm - normal respirations Derm - erythematous macular/papular rash left lower groin. Neuro - normal       Assessment & Plan:

## 2012-12-14 NOTE — Patient Instructions (Addendum)
This looks like shingles. See handout below.  Plan  Valtrex 1,000 mg three times a day x 7  Prednisone 20 mg once a day x 7   Tylenol  For pain.   If tylenol fails to control pain take gabapentin 300 mg: 1 tab day #1, 1 tab twice a day day #2, 1 tab three times a day day # 3 and continue this dose.  If the pain is not controlled you may increase the gabapentin to 2 tabs three times a day  For an open eruption of the rash, fluid filled bumps that open, apply a [place] solution poltice/soak.   Shingles Shingles (herpes zoster) is an infection that is caused by the same virus that causes chickenpox (varicella). The infection causes a painful skin rash and fluid-filled blisters, which eventually break open, crust over, and heal. It may occur in any area of the body, but it usually affects only one side of the body or face. The pain of shingles usually lasts about 1 month. However, some people with shingles may develop long-term (chronic) pain in the affected area of the body. Shingles often occurs many years after the person had chickenpox. It is more common:  In people older than 50 years.  In people with weakened immune systems, such as those with HIV, AIDS, or cancer.  In people taking medicines that weaken the immune system, such as transplant medicines.  In people under great stress. CAUSES  Shingles is caused by the varicella zoster virus (VZV), which also causes chickenpox. After a person is infected with the virus, it can remain in the person's body for years in an inactive state (dormant). To cause shingles, the virus reactivates and breaks out as an infection in a nerve root. The virus can be spread from person to person (contagious) through contact with open blisters of the shingles rash. It will only spread to people who have not had chickenpox. When these people are exposed to the virus, they may develop chickenpox. They will not develop shingles. Once the blisters scab over, the  person is no longer contagious and cannot spread the virus to others. SYMPTOMS  Shingles shows up in stages. The initial symptoms may be pain, itching, and tingling in an area of the skin. This pain is usually described as burning, stabbing, or throbbing.In a few days or weeks, a painful red rash will appear in the area where the pain, itching, and tingling were felt. The rash is usually on one side of the body in a band or belt-like pattern. Then, the rash usually turns into fluid-filled blisters. They will scab over and dry up in approximately 2 3 weeks. Flu-like symptoms may also occur with the initial symptoms, the rash, or the blisters. These may include:  Fever.  Chills.  Headache.  Upset stomach. DIAGNOSIS  Your caregiver will perform a skin exam to diagnose shingles. Skin scrapings or fluid samples may also be taken from the blisters. This sample will be examined under a microscope or sent to a lab for further testing. TREATMENT  There is no specific cure for shingles. Your caregiver will likely prescribe medicines to help you manage the pain, recover faster, and avoid long-term problems. This may include antiviral drugs, anti-inflammatory drugs, and pain medicines. HOME CARE INSTRUCTIONS   Take a cool bath or apply cool compresses to the area of the rash or blisters as directed. This may help with the pain and itching.   Only take over-the-counter or prescription medicines as  directed by your caregiver.   Rest as directed by your caregiver.  Keep your rash and blisters clean with mild soap and cool water or as directed by your caregiver.  Do not pick your blisters or scratch your rash. Apply an anti-itch cream or numbing creams to the affected area as directed by your caregiver.  Keep your shingles rash covered with a loose bandage (dressing).  Avoid skin contact with:  Babies.   Pregnant women.   Children with eczema.   Elderly people with transplants.    People with chronic illnesses, such as leukemia or AIDS.   Wear loose-fitting clothing to help ease the pain of material rubbing against the rash.  Keep all follow-up appointments with your caregiver.If the area involved is on your face, you may receive a referral for follow-up to a specialist, such as an eye doctor (ophthalmologist) or an ear, nose, and throat (ENT) doctor. Keeping all follow-up appointments will help you avoid eye complications, chronic pain, or disability.  SEEK IMMEDIATE MEDICAL CARE IF:   You have facial pain, pain around the eye area, or loss of feeling on one side of your face.  You have ear pain or ringing in your ear.  You have loss of taste.  Your pain is not relieved with prescribed medicines.   Your redness or swelling spreads.   You have more pain and swelling.  Your condition is worsening or has changed.   You have a feveror persistent symptoms for more than 2 3 days.  You have a fever and your symptoms suddenly get worse. MAKE SURE YOU:  Understand these instructions.  Will watch your condition.  Will get help right away if you are not doing well or get worse. Document Released: 03/02/2005 Document Revised: 11/25/2011 Document Reviewed: 10/15/2011 [facility] Patient Information 2014 [place], [place].

## 2012-12-14 NOTE — Telephone Encounter (Signed)
Pt is broken out on her left side.  She thinks it is shingles. She is requesting to be seen today.

## 2012-12-15 DIAGNOSIS — B029 Zoster without complications: Secondary | ICD-10-CM | POA: Insufficient documentation

## 2012-12-15 NOTE — Assessment & Plan Note (Signed)
T11 left with classic painful rash. There was  prodromal pain T11 back on the left.  Plan Valtrex 1,000 mg tid x 7  Prednisone 20 mg qd x 7 - discussed the mixed literature on the efficacy of steroids and PHN  APAP for pain  For uncontrolled pain - a rapid titration of gabapentin to 300 mg tid   Patient information provided.

## 2012-12-23 ENCOUNTER — Encounter: Payer: Self-pay | Admitting: Internal Medicine

## 2012-12-28 ENCOUNTER — Ambulatory Visit (INDEPENDENT_AMBULATORY_CARE_PROVIDER_SITE_OTHER): Payer: Medicare Other | Admitting: Internal Medicine

## 2012-12-28 ENCOUNTER — Encounter: Payer: Self-pay | Admitting: Internal Medicine

## 2012-12-28 VITALS — BP 150/86 | HR 83 | Temp 96.7°F | Wt 153.0 lb

## 2012-12-28 DIAGNOSIS — R202 Paresthesia of skin: Secondary | ICD-10-CM

## 2012-12-28 DIAGNOSIS — N951 Menopausal and female climacteric states: Secondary | ICD-10-CM

## 2012-12-28 DIAGNOSIS — R209 Unspecified disturbances of skin sensation: Secondary | ICD-10-CM

## 2012-12-28 DIAGNOSIS — B029 Zoster without complications: Secondary | ICD-10-CM

## 2012-12-28 MED ORDER — VENLAFAXINE HCL ER 75 MG PO TB24: 1.0000 | ORAL_TABLET | Freq: Every day | ORAL | Status: DC

## 2012-12-28 NOTE — Patient Instructions (Signed)
1. Post-herpetic neuralgia - delighted that it is getting better. Once there is no discomfort stop taking the gabapentin  2. Bowel habit - every one is different and if you do not have any discomfort and your bowel habit has been stable - not a problem  3. Numbness in the feet - we have check B12 and thyroid function - both normal. There is good circulation. The diagnosis is idiopathic neuropathy - no danger.  4. Hot flashes - the safest product to try is Venlafaxine extended release once a day.  5. Dizziness - this may be labyrinthitis - a malfunction of the vestibular apparttus - the inner ear. Plan - a trial of Meclizine - take 1/2 tablet of bonine 2 or 3 times a day. Watch for drowsiness or dry mouth/eyes/etc.    Labyrinthitis (Inner Ear Inflammation) Your exam shows you have an inner ear disturbance or labyrinthitis. The cause of this condition is not known. But it may be due to a virus infection. The symptoms of labyrinthitis include vertigo or dizziness made worse by motion, nausea and vomiting. The onset of labyrinthitis may be very sudden. It usually lasts for a few days and then clears up over 1-2 weeks. The treatment of an inner ear disturbance includes bed rest and medications to reduce dizziness, nausea, and vomiting. You should stay away from alcohol, tranquilizers, caffeine, nicotine, or any medicine your doctor thinks may make your symptoms worse. Further testing may be needed to evaluate your hearing and balance system. Please see your doctor or go to the emergency room right away if you have:  Increasing vertigo, earache, loss of hearing, or ear drainage.  Headache, blurred vision, trouble walking, fainting, or fever.  Persistent vomiting, dehydration, or extreme weakness. Document Released: 03/02/2005 Document Revised: 05/25/2011 Document Reviewed: 08/18/2006 [facility] Patient Information 2014 [place], [place].

## 2012-12-28 NOTE — Progress Notes (Signed)
Subjective:    Patient ID: Mandy Durham, female    DOB: 05-Oct-1945, 67 y.o.   MRN: [id number]  HPI Presents for follow up of shingles and PHN. Pain is much better.  She continues to have dizziness and light-headedness. Unsteady on her feet. Mild nausea. Has not tried meclizine.  Irregular bowel habit. She does take bulk laxative. She has BM q 4 days and this is a long term habit.   Numbness in the feet. B12 '12 was normal, TSH 4/'14 was normal.   Hot flashes - 4-6 times a day. Reviewed in WebMD and NIH the use of venlafaxine vs estradiol. No cancer risk with venlafaxine  PMH, FamHx and SocHx reviewed for any changes and relevance.  Current Outpatient Prescriptions on File Prior to Visit  Medication Sig Dispense Refill  . Calcium Carbonate (CALTRATE 600 PO) Take by mouth daily.      . cholecalciferol (VITAMIN D) 1000 UNITS tablet Take 1,000 Units by mouth daily.      Marland Kitchen gabapentin (NEURONTIN) 300 MG capsule Take 1 capsule (300 mg total) by mouth 3 (three) times daily. Taper up to full dose as directed  90 capsule  3  . glucose blood (ONETOUCH VERIO) test strip Use as instructed to check blood sugars 3 times per week Dx Code 250.00  100 each  3  . hydrochlorothiazide (HYDRODIURIL) 12.5 MG tablet TAKE ONE CAPSULE BY MOUTH ONCE DAILY  30 tablet  5  . Iron-Vitamins (GERITOL PO) Take by mouth.      Letta Pate DELICA LANCETS FINE MISC 1 each by Does not apply route 3 (three) times a week.  100 each  1  . predniSONE (DELTASONE) 10 MG tablet Take 1 tablet (10 mg total) by mouth daily.  7 tablet  0  . valACYclovir (VALTREX) 1000 MG tablet Take 1 tablet (1,000 mg total) by mouth 3 (three) times daily.  21 tablet  0  . [DISCONTINUED] Estradiol (ESTRACE PO) Take by mouth daily.        . [DISCONTINUED] medroxyPROGESTERone (PROVERA) 2.5 MG tablet Take 2.5 mg by mouth daily.         No current facility-administered medications on file prior to visit.      Review of Systems System review is  negative for any constitutional, cardiac, pulmonary, GI or neuro symptoms or complaints other than as described in the HPI.     Objective:   Physical Exam Filed Vitals:   12/28/12 1513  BP: 150/86  Pulse: 83  Temp: 96.7 F (35.9 C)   Gen'l - WNWD woman in no distress Cor- RRR PUlm - normal respirations Neuro - A&O x 3, cognition normal, normal gait and station.         Assessment & Plan:  1. Post-herpetic neuralgia - delighted that it is getting better. Once there is no discomfort stop taking the gabapentin  2. Bowel habit - every one is different and if you do not have any discomfort and your bowel habit has been stable - not a problem  3. Numbness in the feet - we have check B12 and thyroid function - both normal. There is good circulation. The diagnosis is idiopathic neuropathy - no danger.  4. Hot flashes - the safest product to try is Venlafaxine extended release once a day.  5. Dizziness - this may be labyrinthitis - a malfunction of the vestibular apparttus - the inner ear. Plan - a trial of Meclizine - take 1/2 tablet of bonine 2 or 3  times a day. Watch for drowsiness or dry mouth/eyes/etc.   (greater than 50% of 30 min visit spent on education and counseling)

## 2012-12-29 NOTE — Assessment & Plan Note (Signed)
Continued symptoms: 4-6 times a day, affecting quality of life  Plan effexor ER 75 mg daily  Report back with MyChart message.

## 2012-12-29 NOTE — Assessment & Plan Note (Signed)
Distal aspect both feet with tingling/loss of sensation. B12 normal, TSH normal, Circulation normal. Idiopathic peripheral neuropathy

## 2012-12-29 NOTE — Assessment & Plan Note (Signed)
Resolving PHN on low dose gabapentin

## 2013-01-02 ENCOUNTER — Other Ambulatory Visit: Payer: Self-pay | Admitting: *Deleted

## 2013-01-02 MED ORDER — ZOLPIDEM TARTRATE 10 MG PO TABS: 10.0000 mg | ORAL_TABLET | Freq: Every evening | ORAL | Status: DC | PRN

## 2013-01-03 NOTE — Telephone Encounter (Signed)
Faxed script back to [facility].../lmb 

## 2013-01-30 ENCOUNTER — Ambulatory Visit (INDEPENDENT_AMBULATORY_CARE_PROVIDER_SITE_OTHER): Payer: Medicare Other | Admitting: Internal Medicine

## 2013-01-30 ENCOUNTER — Ambulatory Visit: Payer: Medicare Other | Admitting: Internal Medicine

## 2013-01-30 ENCOUNTER — Encounter: Payer: Self-pay | Admitting: Internal Medicine

## 2013-01-30 VITALS — BP 144/86 | HR 82 | Temp 96.0°F | Wt 148.0 lb

## 2013-01-30 DIAGNOSIS — R55 Syncope and collapse: Secondary | ICD-10-CM

## 2013-01-30 NOTE — Progress Notes (Signed)
Subjective:    Patient ID: Mandy Durham, female    DOB: 02-18-1946, 67 y.o.   MRN: [id number]  HPI Mandy Durham presents for evaluation of syncope. She reports that several nights ago she got up from working at her desk and was walking back to the bedroom to retrieve some papers. She got about 5 feet and started to feel very light-headed/near syncopal. Her intent was to get to the bedroom and be able to fall on the bed but she did not make before loosing consciousness. She awoke to find her self with total body shaking that lasted about 15-20 seconds. She denied any incontinence of bowel or bladder, no injury or tongue biting, no post-ictal period. She does not recall being aware of any rapid heart rate or palpitation, no chest pain, no focal sensation, no strange odors or change in vision.   She has been seen in the past for positional vertigo and has been taking time with position change which has helped. She has also used bonine in the past for episodes of dysequilibrium.  Past Medical History  Diagnosis Date  . Insomnia   . Adenomatous polyp of colon   . Overweight(278.02)   . Symptomatic menopausal or female climacteric states   . Hyperlipidemia   . Osteoarthrosis, unspecified whether generalized or localized, unspecified site   . Depressive disorder, not elsewhere classified   . Anxiety state, unspecified   . Hypertension   . Type 2 diabetes mellitus    Past Surgical History  Procedure Laterality Date  . Tubal ligation     Family History  Problem Relation Age of Onset  . Heart failure Mother   . Coronary artery disease Mother   . Dementia Mother   . Diabetes Mother   . Colon cancer Neg Hx   . Breast cancer Neg Hx   . Hypertension Neg Hx   . Heart disease Neg Hx    History   Social History  . Marital Status: Married    Spouse Name: N/A    Number of Children: N/A  . Years of Education: N/A   Occupational History  . Accounts payable analyst [employer]   Social  History Main Topics  . Smoking status: Never Smoker   . Smokeless tobacco: Never Used  . Alcohol Use: No  . Drug Use: No  . Sexual Activity: Not on file   Other Topics Concern  . Not on file   Social History Narrative   HSG.  Married '65.  2 daughters, '71, '77; 4 grandchildren             Current Outpatient Prescriptions on File Prior to Visit  Medication Sig Dispense Refill  . Calcium Carbonate (CALTRATE 600 PO) Take 1,800 Units by mouth daily.       . cholecalciferol (VITAMIN D) 1000 UNITS tablet Take 1,000 Units by mouth daily.      . hydrochlorothiazide (HYDRODIURIL) 12.5 MG tablet TAKE ONE CAPSULE BY MOUTH ONCE DAILY  30 tablet  5  . Venlafaxine HCl 75 MG TB24 Take 1 tablet (75 mg total) by mouth daily. For hot flashes.  30 each  5  . zolpidem (AMBIEN) 10 MG tablet Take 1 tablet (10 mg total) by mouth at bedtime as needed for sleep.  30 tablet  5  . [DISCONTINUED] Estradiol (ESTRACE PO) Take by mouth daily.        . [DISCONTINUED] medroxyPROGESTERone (PROVERA) 2.5 MG tablet Take 2.5 mg by mouth daily.  No current facility-administered medications on file prior to visit.      Review of Systems System review is negative for any constitutional, cardiac, pulmonary, GI or neuro symptoms or complaints other than as described in the HPI.     Objective:   Physical Exam Filed Vitals:   01/30/13 1711  BP: 144/86  Pulse: 82  Temp: 96 F (35.6 C)   Gen'l - WNWD woman in no distress, fully awake and alert HEENT_ C&S clear, PERRLA Cor - RRR Pulm - normal respirations Neuro - A&O x 3, Speech is clear, memory is normal, cognition is normal. She has normal "get up and go," normal gait.       Assessment & Plan:

## 2013-01-30 NOTE — Assessment & Plan Note (Signed)
Patient with recurrent syncope. Her history is unlikley to be a seizure event, unlike a cardiac event with sudden loss of consciousness although she had such a sudden loss of consciousness in Nov '12. No focal signs to suggest structural brain origin. Her history is c/w progressively severe orthostatic hypotension.  Plan EEG, r/o seizure focus  MRI brain - if out of pocket cost is not prohibitive  Referral to EP for evaluation and possible tilt-table testing.

## 2013-01-30 NOTE — Progress Notes (Signed)
Pre visit review using our clinic review tool, if applicable. No additional management support is needed unless otherwise documented below in the visit note. 

## 2013-01-30 NOTE — Patient Instructions (Signed)
Feinting spell = syncope. Your description of this event is very unlike cardiac related syncope; it is unlikely to be a type of seizure; very, very unlikely to be any structual problem, i.e. Brain tumor; most likely to be severe positional vertigo.  Plan Check on the amount of your total deductible - this will effect what I order  Schedule for an MRI of the brain in an open scanner if you do not have a very large deductible  Schedule and EEG - a brainwave study to rule out any possibility of seizure focus in the brain  Schedule a Tilt -table test to confirm or refute the possibility of severe orthostatic hypotension (positional vertigo) that would require medication.  Send me a MyChart message about your deductible - I will hold off ordering the MRI until I hear from you. The other tests I will move ahead with orders.

## 2013-01-31 ENCOUNTER — Other Ambulatory Visit: Payer: Self-pay | Admitting: Internal Medicine

## 2013-01-31 DIAGNOSIS — R55 Syncope and collapse: Secondary | ICD-10-CM

## 2013-02-01 ENCOUNTER — Ambulatory Visit: Payer: Medicare Other | Admitting: Physician Assistant

## 2013-02-01 ENCOUNTER — Encounter: Payer: Self-pay | Admitting: Internal Medicine

## 2013-02-03 ENCOUNTER — Ambulatory Visit (INDEPENDENT_AMBULATORY_CARE_PROVIDER_SITE_OTHER): Payer: Medicare Other | Admitting: Radiology

## 2013-02-03 DIAGNOSIS — R55 Syncope and collapse: Secondary | ICD-10-CM

## 2013-02-03 NOTE — Procedures (Signed)
    History:  Mandy Durham is a 67 year old patient with a history of syncope that occurred several days prior to this evaluation. The patient set up from a desk, and began walking to a bedroom, and she suddenly felt very lightheaded, near syncopal. The patient did lose consciousness, and had some shaking of the body. The patient is being evaluated for possible seizures.  This is a routine EEG. No skull defects are noted. Medications include calcium supplementation, vitamin D, fish oil, hydrochlorothiazide, Effexor, Ambien, and estradiol.   EEG classification: Normal awake and drowsy  Description of the recording: The background rhythms of this recording consists of a fairly well modulated medium amplitude alpha rhythm of 8 Hz that is reactive to eye opening and closure. As the record progresses, the patient appears to remain in the waking state throughout the recording. Photic stimulation was performed, resulting in a bilateral and symmetric photic driving response. Hyperventilation was also performed, resulting in a minimal buildup of the background rhythm activities without significant slowing seen. Toward the end of the recording, the patient enters the drowsy state with slight symmetric slowing seen. The patient never enters stage II sleep. At no time during the recording does there appear to be evidence of spike or spike wave discharges or evidence of focal slowing. EKG monitor shows no evidence of cardiac rhythm abnormalities with a heart rate of 84.  Impression: This is a normal EEG recording in the waking and drowsy state. No evidence of ictal or interictal discharges are seen.

## 2013-02-08 ENCOUNTER — Ambulatory Visit (INDEPENDENT_AMBULATORY_CARE_PROVIDER_SITE_OTHER): Payer: Medicare Other | Admitting: Physician Assistant

## 2013-02-08 ENCOUNTER — Encounter: Payer: Self-pay | Admitting: Physician Assistant

## 2013-02-08 VITALS — BP 158/95 | HR 96 | Ht 66.0 in | Wt 148.6 lb

## 2013-02-08 DIAGNOSIS — R55 Syncope and collapse: Secondary | ICD-10-CM

## 2013-02-08 DIAGNOSIS — I10 Essential (primary) hypertension: Secondary | ICD-10-CM

## 2013-02-08 LAB — BASIC METABOLIC PANEL
Calcium: 9.4 mg/dL (ref 8.4–10.5)
GFR: 82.86 mL/min (ref >60.00)
Glucose, Bld: 94 mg/dL (ref 70–99)
Potassium: 3.4 mEq/L — ABNORMAL LOW (ref 3.5–5.1)
Sodium: 137 mEq/L (ref 135–145)

## 2013-02-08 MED ORDER — METOPROLOL SUCCINATE ER 25 MG PO TB24: 25.0000 mg | ORAL_TABLET | Freq: Every day | ORAL | Status: DC

## 2013-02-08 NOTE — Progress Notes (Addendum)
[address] [place], [place]  [address] Phone: [phone] Fax:  [phone]  Date:  02/08/2013   ID:  Mandy Latanja, Durham 05-26-1945, MRN [id number]  PCP:  Rene Paci, MD  Cardiologist:  Dr. Marca Ancona      History of Present Illness: Mandy Durham is a 67 y.o. female with a history of chronic LBBB and syncope.  She had a syncopal episode in November 2012. She was walking from her office to her car and apparently passed out and fell on her face. No prodrome. She was not unconscious for long. No tachypalpitations or dyspnea. No chest pain. She had given blood about 4 hours earlier that day but had had no orthostatic symptoms and seemed to be doing well after the blood draw. She had had no prior syncopal spells.  She had a cath in 2002 because of chest pain that showed no angiographic CAD. She was evaluated by Dr. Marca Ancona in 02/2011.  Echocardiogram showed EF 50-55% with septal-lateral dyssynchrony consistent with LBBB. 3 week event monitor showed no significant arrhythmia.  She was last seen by Dr. Shirlee Latch 05/2011. It was felt that her prior syncopal episode was a simple orthostatic or vagal-type event in the setting of blood donation. When necessary follow up is recommended.  She was evaluated by Dr. Debby Bud last week with recurrent syncope. EEG performed 02/03/13 was normal.  She is referred back to cardiology for evaluation.    She has had 2 episodes of syncope.  She had one episode in 11/2012.  She was cleaning a dresser drawer and sat down.  When she stood up, she became near syncopal and then passed out.  She awoke on the floor and felt disoriented for several seconds. She had another episode 2 weeks ago.  She stood from the kitchen table and walked several feet before passing out.  She felt near syncopal prior to this.  She awoke and was shaking "violently." She also felt disoriented.  She felt that this went on for about 10 seconds and she could not stop it. She  denies any loss of bowel or bladder function. She denies any tongue biting. She denies a history of chest pain or shortness of breath. She denies orthopnea, PND or pedal edema. She denies any symptoms of near syncope while sitting.  Recent Labs: 07/13/2012: TSH 1.92  09/20/2012: ALT 38*; Creatinine 0.8; Hemoglobin 13.7; Potassium 4.1  11/03/2012: Direct LDL 106.9; HDL 102.00   Wt Readings from Last 3 Encounters:  02/08/13 148 lb 9.6 oz (67.405 kg)  01/30/13 148 lb (67.132 kg)  12/28/12 153 lb (69.4 kg)     Past Medical History  Diagnosis Date  . Insomnia   . Adenomatous polyp of colon   . Overweight(278.02)   . Symptomatic menopausal or female climacteric states   . Hyperlipidemia   . Osteoarthrosis, unspecified whether generalized or localized, unspecified site   . Depressive disorder, not elsewhere classified   . Anxiety state, unspecified   . Hypertension   . Type 2 diabetes mellitus   . Syncope and collapse     11/12: Holter 12/12 with rare PACS, HR range 64-140, average 82, no significant arrhythmias. Echo (1/13): EF 50-55%, mild LVH, septal-lateral dyssynchrony c/w LBBB. 3-week event monitor (1/13): No significant arrhythmia.   Marland Kitchen LBBB (left bundle branch block)     LHC in 2002 showed normal coronaries.     Current Outpatient Prescriptions  Medication Sig Dispense Refill  . Calcium Carbonate (  CALTRATE 600 PO) Take 1,800 Units by mouth daily.       . cholecalciferol (VITAMIN D) 1000 UNITS tablet Take 1,000 Units by mouth daily.      . fish oil-omega-3 fatty acids 1000 MG capsule Take 1,800 mg by mouth daily.      . hydrochlorothiazide (HYDRODIURIL) 12.5 MG tablet TAKE ONE CAPSULE BY MOUTH ONCE DAILY  30 tablet  5  . Venlafaxine HCl 75 MG TB24 Take 1 tablet (75 mg total) by mouth daily. For hot flashes.  30 each  5  . zolpidem (AMBIEN) 10 MG tablet Take 1 tablet (10 mg total) by mouth at bedtime as needed for sleep.  30 tablet  5  . [DISCONTINUED] Estradiol (ESTRACE PO) Take  by mouth daily.        . [DISCONTINUED] medroxyPROGESTERone (PROVERA) 2.5 MG tablet Take 2.5 mg by mouth daily.         No current facility-administered medications for this visit.    Allergies:   Review of patient's allergies indicates no known allergies.   Social History:  The patient  reports that she has never smoked. She has never used smokeless tobacco. She reports that she does not drink alcohol or use illicit drugs.   Family History:  The patient's family history includes Coronary artery disease in her mother; Dementia in her mother; Diabetes in her mother; Heart failure in her mother. There is no history of Colon cancer, Breast cancer, Hypertension, or Heart disease.   ROS:  Please see the history of present illness.      All other systems reviewed and negative.   PHYSICAL EXAM: VS:  BP 158/95  Pulse 96  Ht 5\' 6"  (1.676 m)  Wt 148 lb 9.6 oz (67.405 kg)  BMI 24.00 kg/m2  Filed Vitals:   02/08/13 1014 02/08/13 1016 02/08/13 1017 02/08/13 1018  BP: 154/87 157/91 163/94 158/95  Pulse: 83 89 90 96  Height:      Weight:         Well nourished, well developed, in no acute distress HEENT: normal Neck: no JVD Vascular: No carotid bruits Cardiac:  normal S1, S2; RRR; 1/6 systolic murmur at the RUSB Lungs:  clear to auscultation bilaterally, no wheezing, rhonchi or rales Abd: soft, nontender, no hepatomegaly Ext: no edema Skin: warm and dry Neuro:  CNs 2-12 intact, no focal abnormalities noted  EKG:  NSR, HR 84, LBBB     ASSESSMENT AND PLAN:  1. Syncope: Both episodes of syncope occurred after standing. These likely represent orthostatic blood pressure drops. She was placed on hydrochlorothiazide a few months ago. She does admit to poor fluid intake at times. I have recommended stopping her hydrochlorothiazide. I reviewed this with Dr. Shirlee Latch. He agreed. We will place her on low dose beta blocker therapy with Toprol-XL 25 mg daily to cover her blood pressure. Given the new  JNC 8 guidelines, she can tolerate a blood pressure of 150 systolic. If she has recurrent or worsening problems in the future, Mestinon could be considered to help control her symptoms. However, she was not orthostatic on exam today. Hopefully she will not need to do this. We discussed proper hygiene in terms of getting up slowly and adequate fluid intake. 2. Hypertension: Adjust medications as noted. 3. Disposition: Follow up with Dr. Shirlee Latch in 2 months.  Signed, Tereso Newcomer, PA-C  02/08/2013 9:59 AM

## 2013-02-08 NOTE — Patient Instructions (Addendum)
Your physician has recommended you make the following change in your medication:  Stop HCTZ Start toprol xl 25 mg daily  Your physician recommends that you return for lab work in: today bmet  REMEMBER TO STAND SLOWLY, DRINK PLENTY OF FLUIDS, THIS WILL REDUCE DIZZINESS.   Your physician recommends that you schedule a follow-up appointment in: 6-8 WEEKS WITH DR [facility]

## 2013-02-23 ENCOUNTER — Encounter: Payer: Self-pay | Admitting: Internal Medicine

## 2013-02-24 MED ORDER — PAROXETINE MESYLATE 7.5 MG PO CAPS: 1.0000 | ORAL_CAPSULE | Freq: Every day | ORAL | Status: DC

## 2013-03-22 ENCOUNTER — Ambulatory Visit: Payer: Medicare Other | Admitting: Cardiology

## 2013-03-29 ENCOUNTER — Encounter: Payer: Self-pay | Admitting: Physician Assistant

## 2013-03-29 ENCOUNTER — Ambulatory Visit (INDEPENDENT_AMBULATORY_CARE_PROVIDER_SITE_OTHER): Payer: Medicare HMO | Admitting: Physician Assistant

## 2013-03-29 VITALS — BP 160/86 | HR 73 | Ht 66.0 in | Wt 152.0 lb

## 2013-03-29 DIAGNOSIS — R55 Syncope and collapse: Secondary | ICD-10-CM

## 2013-03-29 DIAGNOSIS — I10 Essential (primary) hypertension: Secondary | ICD-10-CM

## 2013-03-29 DIAGNOSIS — R42 Dizziness and giddiness: Secondary | ICD-10-CM

## 2013-03-29 MED ORDER — FLUDROCORTISONE ACETATE 0.1 MG PO TABS: 0.1000 mg | ORAL_TABLET | ORAL | Status: DC

## 2013-03-29 NOTE — Progress Notes (Signed)
[place], [place], [place]  [address] Phone: [phone] Fax:  [phone]  Date:  03/29/2013   ID:  Mandy Durham, Mandy Durham 1945/08/12, MRN [id number]  PCP:  Gwendolyn Grant, MD  Cardiologist:  Dr. Loralie Champagne      History of Present Illness: Mandy Durham is a 68 y.o. female with a history of chronic LBBB and syncope.  She had a syncopal episode in November 2012. She was walking from her office to her car and apparently passed out and fell on her face. No prodrome. She was not unconscious for long. No tachypalpitations or dyspnea. No chest pain. She had given blood about 4 hours earlier that day but had had no orthostatic symptoms and seemed to be doing well after the blood draw. She had had no prior syncopal spells.  She had a cath in 2002 because of chest pain that showed no angiographic CAD. She was evaluated by Dr. Loralie Champagne in 02/2011.  Echocardiogram showed EF 50-55% with septal-lateral dyssynchrony consistent with LBBB. 3 week event monitor showed no significant arrhythmia.  It was felt that her syncopal episode was a simple orthostatic or vagal-type event in the setting of blood donation. When necessary follow up was recommended.  I saw her 02/08/2013 for the evaluation of recurrent syncope. She had had 2 episodes. Upon awakening with one episode, she recalls violently shaking. She saw her primary care physician.  He set her up for an EEG performed 02/03/13 that was normal.  Orthostatic vital signs were normal. However, it was felt that her recent syncope was likely related to orthostatic blood pressure drop. Hydrochlorothiazide was stopped. She was placed on low-dose beta blocker (Toprol-XL 25 mg).  She returns for follow up.  Since last seen, she has had one other episode of syncope. This occurred shortly after standing. She felt near syncopal prior to her syncopal episode. She felt confused upon awakening. She also noted shaking. She denies any exertional chest pain or  shortness of breath. She denies orthopnea, PND or edema. She is NYHA class 1-2. She denies palpitations. She does an exercise class weekly.  Recent Labs: 07/13/2012: TSH 1.92  09/20/2012: ALT 38*; Hemoglobin 13.7  11/03/2012: Direct LDL 106.9; HDL Cholesterol 102.00  02/08/2013: Creatinine 0.7; Potassium 3.4*   Wt Readings from Last 3 Encounters:  03/29/13 152 lb (68.947 kg)  02/08/13 148 lb 9.6 oz (67.405 kg)  01/30/13 148 lb (67.132 kg)     Past Medical History  Diagnosis Date  . Insomnia   . Adenomatous polyp of colon   . Overweight   . Symptomatic menopausal or female climacteric states   . Hyperlipidemia   . Osteoarthrosis, unspecified whether generalized or localized, unspecified site   . Depressive disorder, not elsewhere classified   . Anxiety state, unspecified   . Hypertension   . Type 2 diabetes mellitus   . Syncope and collapse     11/12: Holter 12/12 with rare PACS, HR range 64-140, average 82, no significant arrhythmias. Echo (1/13): EF 50-55%, mild LVH, septal-lateral dyssynchrony c/w LBBB. 3-week event monitor (1/13): No significant arrhythmia.   Marland Kitchen LBBB (left bundle branch block)     LHC in 2002 showed normal coronaries.     Current Outpatient Prescriptions  Medication Sig Dispense Refill  . Calcium Carbonate (CALTRATE 600 PO) Take 1,800 Units by mouth daily.       . cholecalciferol (VITAMIN D) 1000 UNITS tablet Take 1,000 Units by mouth daily.      Marland Kitchen  fish oil-omega-3 fatty acids 1000 MG capsule Take 1,800 mg by mouth daily.      . metoprolol succinate (TOPROL XL) 25 MG 24 hr tablet Take 1 tablet (25 mg total) by mouth daily.  30 tablet  6  . Venlafaxine HCl 75 MG TB24 Take 1 tablet (75 mg total) by mouth daily. For hot flashes.  30 each  5  . zolpidem (AMBIEN) 10 MG tablet Take 1 tablet (10 mg total) by mouth at bedtime as needed for sleep.  30 tablet  5  . [DISCONTINUED] Estradiol (ESTRACE PO) Take by mouth daily.        . [DISCONTINUED] medroxyPROGESTERone  (PROVERA) 2.5 MG tablet Take 2.5 mg by mouth daily.         No current facility-administered medications for this visit.    Allergies:   Review of patient's allergies indicates no known allergies.   Social History:  The patient  reports that she has never smoked. She has never used smokeless tobacco. She reports that she does not drink alcohol or use illicit drugs.   Family History:  The patient's family history includes Coronary artery disease in her mother; Dementia in her mother; Diabetes in her mother; Heart failure in her mother. There is no history of Colon cancer, Breast cancer, Hypertension, or Heart disease.   ROS:  Please see the history of present illness.  She notes a spinning sensation with changes in head positioning. This is separate from her orthostatic intolerance.  All other systems reviewed and negative.   PHYSICAL EXAM: VS:  BP 140/82  Pulse 68  Ht 5\' 6"  (1.676 m)  Wt 152 lb (68.947 kg)  BMI 24.55 kg/m2  Filed Vitals:   03/29/13 1519 03/29/13 1521 03/29/13 1523 03/29/13 1524  BP: 153/84 158/87 160/86 160/86  Pulse: 66 69 73 73  Height:      Weight:         Well nourished, well developed, in no acute distress HEENT: normal Neck: no JVD Cardiac:  normal S1, S2; RRR; no murmur   Lungs:  clear to auscultation bilaterally, no wheezing, rhonchi or rales Abd: soft, nontender, no hepatomegaly Ext: no edema Skin: warm and dry Neuro:  CNs 2-12 intact, no focal abnormalities noted  EKG:   NSR, HR 68, LBBB    ASSESSMENT AND PLAN:  1. Syncope:   She has had recurrent syncope. This appears to be most consistent with orthostatic hypotension. Orthostatic vital signs on exam today are normal. Her symptoms are not that frequent. We reviewed increasing her fluid intake today. I have also recommended LE compression stockings. I will place her on Florinef 0.1 mg daily. Check a basic metabolic panel in one week. She will be brought back in follow up. If she continues to have  episodes of postural intolerance, consider referral to EP. 2. Positional Vertigo:  She also has symptoms of spinning brought on by changes in positioning of her head. This seems to be most consistent with benign paroxysmal positional vertigo. I have asked her to followup with primary care for further evaluation. 3. Hypertension:  Fair control. Hopefully, she will be able to tolerate Florinef. If not, we could consider pyridostigmine.  4. Disposition: Follow up with Dr. Loralie Champagne in 6-8 weeks.   Signed, Richardson Dopp, PA-C  03/29/2013 2:58 PM

## 2013-03-29 NOTE — Patient Instructions (Addendum)
PICK UP SOME OTC COMPRESSION STOCKINGS, MAKE SURE TO WEAR DAILY DRINK  MORE WATER DAILY  IF DOING BOTH THE COMPRESSION STOCKINGS AND INCREASE WATER INTAKE DOES NOT SEEM TO HELP SYMPTOMS; A PRESCRIPTION FOR FLORINEF 0.1 MG HAS BEEN SENT IN TO [facility] ON [address] FOR YOU  IF YOU DO START THE FLORINEF YOU WILL TAKE 0.1 MG DAILY AND STILL WEAR THE COMPRESSION STOCKINGS AND STILL TRY AND INCREASE YOUR WATER INTAKE  YOU HAVE A FOLLOW UP WITH DR. Aundra Dubin 05/12/13 @ 2:30  YOU HAVE AN APPT WITH DR. Linda Hedges 03/30/13 @ 8:45 FOR VERTIGO SYMPTOMS PER SCOTT WEAVER, PAC

## 2013-03-30 ENCOUNTER — Encounter: Payer: Self-pay | Admitting: Internal Medicine

## 2013-03-30 ENCOUNTER — Ambulatory Visit (INDEPENDENT_AMBULATORY_CARE_PROVIDER_SITE_OTHER): Payer: Medicare HMO | Admitting: Internal Medicine

## 2013-03-30 VITALS — BP 146/82 | HR 65 | Temp 96.7°F | Wt 151.8 lb

## 2013-03-30 DIAGNOSIS — H8309 Labyrinthitis, unspecified ear: Secondary | ICD-10-CM

## 2013-03-30 DIAGNOSIS — I10 Essential (primary) hypertension: Secondary | ICD-10-CM

## 2013-03-30 DIAGNOSIS — R55 Syncope and collapse: Secondary | ICD-10-CM

## 2013-03-30 NOTE — Progress Notes (Signed)
Pre visit review using our clinic review tool, if applicable. No additional management support is needed unless otherwise documented below in the visit note. 

## 2013-03-30 NOTE — Patient Instructions (Signed)
Good to see you, We need to stop meeting this way.  The dizzyness is most likely and "inner ear" problem = labyrinthitis -malfunction of the vestibular apparatus which is our body's gyroscope. Your neurologic exam is normal with no indication of a "brain" problem.  Plan Continue the Bonine  Referral to neuro-rehab for vestibular rehabilitation and training.  If you do not improve in several weeks will need to consider brain imaging to look for more serious problems.  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  Plan A trial of the Florinef to prevent this from happening. Will need to monitor your blood pressure on this medication  If this continues to be a problem Mr. Kathlen Mody suggests referral to the EP (electro-physiologist) in the cardiology division for evaluation.    Labyrinthitis (Inner Ear Inflammation) Your exam shows you have an inner ear disturbance or labyrinthitis. The cause of this condition is not known. But it may be due to a virus infection. The symptoms of labyrinthitis include vertigo or dizziness made worse by motion, nausea and vomiting. The onset of labyrinthitis may be very sudden. It usually lasts for a few days and then clears up over 1-2 weeks. The treatment of an inner ear disturbance includes bed rest and medications to reduce dizziness, nausea, and vomiting. You should stay away from alcohol, tranquilizers, caffeine, nicotine, or any medicine your doctor thinks may make your symptoms worse. Further testing may be needed to evaluate your hearing and balance system. Please see your doctor or go to the emergency room right away if you have:  Increasing vertigo, earache, loss of hearing, or ear drainage.  Headache, blurred vision, trouble walking, fainting, or fever.  Persistent vomiting, dehydration, or extreme weakness. Document Released: 03/02/2005 Document Revised: 05/25/2011 Document  Reviewed: 08/18/2006 [facility] Patient Information 2014 [place].

## 2013-03-30 NOTE — Progress Notes (Signed)
   Subjective:    Patient ID: Mandy Durham, female    DOB: 02/24/1946, 68 y.o.   MRN: [id number]  HPI Mrs. Mandy Durham has been evaluated for syncope and collapse over the past several months. She has had 3 syncopal episodes, fortunately w/o major injury. She has had normal EEG. She has not had neuro imaging. She has seen Richardson Dopp for cardiology, most recently Jan 14th. The working diagnosis is positional hypotension with syncope. She has been started on Florinef 0.1mg  once a day. At her last cardiology visit she did complain of vertigo and has been referred for further evaluation.   [name] reports intermittent symptoms of dysequilibrium w/o symptoms of "room spinning" vertigo. She will have mild nausea and mild anxiety when her symptoms wax. She has had no focal neurologic symptoms.  PMH, FamHx and SocHx reviewed for any changes and relevance. Current Outpatient Prescriptions on File Prior to Visit  Medication Sig Dispense Refill  . Calcium Carbonate (CALTRATE 600 PO) Take 1,800 Units by mouth daily.       . cholecalciferol (VITAMIN D) 1000 UNITS tablet Take 1,000 Units by mouth daily.      . fish oil-omega-3 fatty acids 1000 MG capsule Take 1,800 mg by mouth daily.      . fludrocortisone (FLORINEF) 0.1 MG tablet Take 1 tablet (0.1 mg total) by mouth every morning.  30 tablet  6  . metoprolol succinate (TOPROL XL) 25 MG 24 hr tablet Take 1 tablet (25 mg total) by mouth daily.  30 tablet  6  . Venlafaxine HCl 75 MG TB24 Take 1 tablet (75 mg total) by mouth daily. For hot flashes.  30 each  5  . zolpidem (AMBIEN) 10 MG tablet Take 1 tablet (10 mg total) by mouth at bedtime as needed for sleep.  30 tablet  5  . [DISCONTINUED] Estradiol (ESTRACE PO) Take by mouth daily.        . [DISCONTINUED] medroxyPROGESTERone (PROVERA) 2.5 MG tablet Take 2.5 mg by mouth daily.         No current facility-administered medications on file prior to visit.      Review of Systems System review is negative  for any constitutional, cardiac, pulmonary, GI or neuro symptoms or complaints other than as described in the HPI.     Objective:   Physical Exam Filed Vitals:   03/30/13 0900  BP: 146/82  Pulse: 65  Temp: 96.7 F (35.9 C)   gen'l - WNWD woman in no distress HEENT - [place]/AT, C&S clear Cor 2+ radial, RRR, no carotid bruits PUlm - normal respirations Neuro - A&O x 3, CN II-XII - normal facial symmetry, PERRLY, EOMI, no nystagmus; MS 5/5; cerebellar - no tremor, normal gait, normal tandem gait, normal F-N and Heel-Shin maneuver.       Assessment & Plan:

## 2013-04-01 NOTE — Assessment & Plan Note (Signed)
Patient with non-focal neuro exam; symptoms that are c/w dysequilibrium. Reviewed anatomy (text and cartoon) vestibular apparatus and reviewed classic signs of labyrinthitis.  Plan Reassurance  Meclizine q 6 hrs as needed.  For symptoms that progress or do not respond to meclizine will need neuro imaging.

## 2013-04-01 NOTE — Assessment & Plan Note (Signed)
BP Readings from Last 3 Encounters:  03/30/13 146/82  03/29/13 160/86  02/08/13 158/95   Plan Continue present diuretic therapy.

## 2013-04-01 NOTE — Assessment & Plan Note (Signed)
Neurally mediated syncope is working diagnosis. She has not yet started Florinef  Plan She is advised to closely monitor blood pressure once she start florinef.

## 2013-04-10 ENCOUNTER — Other Ambulatory Visit: Payer: Self-pay

## 2013-04-10 DIAGNOSIS — R55 Syncope and collapse: Secondary | ICD-10-CM

## 2013-04-10 MED ORDER — FLUDROCORTISONE ACETATE 0.1 MG PO TABS: 0.1000 mg | ORAL_TABLET | ORAL | Status: DC

## 2013-04-12 ENCOUNTER — Ambulatory Visit: Payer: Medicare HMO | Attending: Internal Medicine | Admitting: Rehabilitative and Restorative Service Providers"

## 2013-04-12 DIAGNOSIS — R42 Dizziness and giddiness: Secondary | ICD-10-CM | POA: Insufficient documentation

## 2013-04-12 DIAGNOSIS — IMO0001 Reserved for inherently not codable concepts without codable children: Secondary | ICD-10-CM | POA: Insufficient documentation

## 2013-04-12 DIAGNOSIS — H8309 Labyrinthitis, unspecified ear: Secondary | ICD-10-CM | POA: Insufficient documentation

## 2013-04-12 DIAGNOSIS — R269 Unspecified abnormalities of gait and mobility: Secondary | ICD-10-CM | POA: Insufficient documentation

## 2013-04-19 ENCOUNTER — Ambulatory Visit: Payer: Medicare HMO | Attending: Internal Medicine | Admitting: Rehabilitative and Restorative Service Providers"

## 2013-04-19 DIAGNOSIS — R42 Dizziness and giddiness: Secondary | ICD-10-CM | POA: Insufficient documentation

## 2013-04-19 DIAGNOSIS — H8309 Labyrinthitis, unspecified ear: Secondary | ICD-10-CM | POA: Insufficient documentation

## 2013-04-19 DIAGNOSIS — R269 Unspecified abnormalities of gait and mobility: Secondary | ICD-10-CM | POA: Insufficient documentation

## 2013-04-19 DIAGNOSIS — IMO0001 Reserved for inherently not codable concepts without codable children: Secondary | ICD-10-CM | POA: Insufficient documentation

## 2013-04-28 ENCOUNTER — Ambulatory Visit: Payer: Medicare HMO | Admitting: Rehabilitative and Restorative Service Providers"

## 2013-05-12 ENCOUNTER — Encounter: Payer: Self-pay | Admitting: Cardiology

## 2013-05-12 ENCOUNTER — Ambulatory Visit (INDEPENDENT_AMBULATORY_CARE_PROVIDER_SITE_OTHER): Payer: Medicare HMO | Admitting: Cardiology

## 2013-05-12 VITALS — BP 124/74 | HR 68 | Ht 65.5 in | Wt 154.8 lb

## 2013-05-12 DIAGNOSIS — R55 Syncope and collapse: Secondary | ICD-10-CM

## 2013-05-12 DIAGNOSIS — I447 Left bundle-branch block, unspecified: Secondary | ICD-10-CM

## 2013-05-12 MED ORDER — FLUDROCORTISONE ACETATE 0.1 MG PO TABS: 0.1000 mg | ORAL_TABLET | ORAL | Status: DC

## 2013-05-12 NOTE — Patient Instructions (Addendum)
Your physician has recommended you make the following change in your medication:   1. Florinef 0.1 mg 1 tablet daily.  A prescription for compression stockings was given to you today.  Your physician recommends that you schedule a follow-up appointment in: 6 weeks with bmet.

## 2013-05-13 DIAGNOSIS — I447 Left bundle-branch block, unspecified: Secondary | ICD-10-CM | POA: Insufficient documentation

## 2013-05-13 NOTE — Progress Notes (Signed)
Patient ID: Mandy Durham, female   DOB: November 07, 1945, 68 y.o.   MRN: [id number] PCP: Dr. Linda Hedges  68 yo with history of chronic LBBB and syncope returns for cardiology followup. LBBB has been noted since at least 2000.  She had a cath in 2002 because of chest pain that showed no angiographic CAD.  I had her do an echocardiogram in 1/13 showed EF 50-55% with septal-lateral dyssynchrony consistent with LBBB.   For about 3 years, she has had episodes of syncope and presyncope.  She tends to get lightheaded with prolonged standing or walking at times.  Occasionally, she will get severely lightheaded and pass out or nearly pass out.  All episodes seem to have been while standing and have a prodrome of lightheadedness.  No chest pain or tachypalpitations.  She was seen not long ago by Richardson Dopp and it was recommended that she try Florinef and wear compression stockings.  She did not do either. Since that appointment, she thinks that she has passed out twice.   Both times while standing.  She does notice that when she drinks a lot of fluid, she does not feel as lightheaded.  No exertional dyspnea.  ECG: NSR, LBBB  Labs (4/12): LDL 80, HDL 123, TSH normal Labs (11/12): K 4, creatinine 0.8 Labs (8/14): LDL 107, HDL 102 Labs (11/14): K 3.4, creatinine 0.7  PMH: 1. Depression 2. Hyperlipidemia 3. Osteoarthritis 4. LBBB since around 2000.  LHC in 2002 showed normal coronaries.   5. Syncope: Holter 12/12 with rare PACS, HR range 64-140, average 82, no significant arrhythmias.  Echo (1/13): EF 50-55%, mild LVH, septal-lateral dyssynchrony c/w LBBB.  3-week event monitor (1/13): No significant arrhythmia.  EEG (11/14) normal.  6. H/o labyrinthitis  SH: Lives in [place], married, nonsmoker, works as an Biochemist, clinical.   FH: No CAD that she knows of.  No CHF.    Current Outpatient Prescriptions  Medication Sig Dispense Refill  . Calcium Carbonate (CALTRATE 600 PO) Take 1,800 Units by mouth daily.        . cholecalciferol (VITAMIN D) 1000 UNITS tablet Take 1,000 Units by mouth daily.      . fish oil-omega-3 fatty acids 1000 MG capsule Take 1 g by mouth daily.       . metoprolol succinate (TOPROL XL) 25 MG 24 hr tablet Take 1 tablet (25 mg total) by mouth daily.  30 tablet  6  . Venlafaxine HCl 75 MG TB24 Take 1 tablet (75 mg total) by mouth daily. For hot flashes.  30 each  5  . zolpidem (AMBIEN) 10 MG tablet Take 1 tablet (10 mg total) by mouth at bedtime as needed for sleep.  30 tablet  5  . fludrocortisone (FLORINEF) 0.1 MG tablet Take 1 tablet (0.1 mg total) by mouth every morning.  30 tablet  6  . [DISCONTINUED] Estradiol (ESTRACE PO) Take by mouth daily.        . [DISCONTINUED] medroxyPROGESTERone (PROVERA) 2.5 MG tablet Take 2.5 mg by mouth daily.         No current facility-administered medications for this visit.    BP 124/74  Pulse 68  Ht 5' 5.5" (1.664 m)  Wt 70.217 kg (154 lb 12.8 oz)  BMI 25.36 kg/m2 General: NAD Neck: No JVD, no thyromegaly or thyroid nodule.  Lungs: Clear to auscultation bilaterally with normal respiratory effort. CV: Nondisplaced PMI.  Heart regular S1/S2, no S3/S4, no murmur.  No peripheral edema.  No carotid bruit.  Normal  pedal pulses.  Abdomen: Soft, nontender, no hepatosplenomegaly, no distention.  Neurologic: Alert and oriented x 3.  Psych: Normal affect. Extremities: No clubbing or cyanosis.   Assessment/Plan: 1. Syncope/presyncope: Always when standing, brief in duration.  Less symptomatic when she drinks a lot of fluid.  I suspect that these spells represent autonomic insufficiency/orthostatic hypotension.  Past event monitoring has shown no arrhythmia.   - She should stay well-hydrated at all times and try to avoid prolonged standing.  - I think it would be reasonable for her to try Florinef 0.1 mg daily.  - I would like her to wear knee-high compression stockings.  - Followup in 4-6 weeks to see if these measures are working.  2. LBBB:  Chronic.  No ischemic symptoms.   Loralie Champagne 05/13/2013

## 2013-05-22 ENCOUNTER — Encounter: Payer: Self-pay | Admitting: Internal Medicine

## 2013-05-22 ENCOUNTER — Ambulatory Visit (INDEPENDENT_AMBULATORY_CARE_PROVIDER_SITE_OTHER): Payer: Medicare HMO | Admitting: Internal Medicine

## 2013-05-22 VITALS — BP 134/90 | HR 77 | Temp 97.6°F | Wt 153.8 lb

## 2013-05-22 DIAGNOSIS — R55 Syncope and collapse: Secondary | ICD-10-CM

## 2013-05-22 NOTE — Progress Notes (Signed)
Pre visit review using our clinic review tool, if applicable. No additional management support is needed unless otherwise documented below in the visit note. 

## 2013-05-22 NOTE — Patient Instructions (Addendum)
Good to see you. It is great that the florinef is having such a positive effect. I reviewed your blood pressure readings going back several years and todays blood pressure is fine.  1. Orthostatic hypotension - Plan Continue the florinef.  Please check your blood pressure a couple of times a week: if you see a trend of increasing blood pressure please let me, my successor or Dr. Aundra Dubin know.  2. Blood sugar - In 2o12 the A1C was 6.1%; in July '14 the A1C was  6.5% top of NORMAL. The serum blood sugars have all been normal over the past several years. You do not have diabetes. Plan A health prudent diet.  3. Sciatica left leg -  Plan Aleve and/or tylenol is fine to take when you have pain  At night a pillow between your legs when you sleep on you side will help  Many good exercises to help relieve sciatica - go to YouTube and search Sciatica and exercise/stretch   Moving forward I will have you see Dr. Scarlette Calico, [place] too.       Sciatica Sciatica is pain, weakness, numbness, or tingling along the path of the sciatic nerve. The nerve starts in the lower back and runs down the back of each leg. The nerve controls the muscles in the lower leg and in the back of the knee, while also providing sensation to the back of the thigh, lower leg, and the sole of your foot. Sciatica is a symptom of another medical condition. For instance, nerve damage or certain conditions, such as a herniated disk or bone spur on the spine, pinch or put pressure on the sciatic nerve. This causes the pain, weakness, or other sensations normally associated with sciatica. Generally, sciatica only affects one side of the body. CAUSES   Herniated or slipped disc.  Degenerative disk disease.  A pain disorder involving the narrow muscle in the buttocks (piriformis syndrome).  Pelvic injury or fracture.  Pregnancy.  Tumor (rare). SYMPTOMS  Symptoms can vary from mild to very severe. The symptoms usually travel  from the low back to the buttocks and down the back of the leg. Symptoms can include:  Mild tingling or dull aches in the lower back, leg, or hip.  Numbness in the back of the calf or sole of the foot.  Burning sensations in the lower back, leg, or hip.  Sharp pains in the lower back, leg, or hip.  Leg weakness.  Severe back pain inhibiting movement. These symptoms may get worse with coughing, sneezing, laughing, or prolonged sitting or standing. Also, being overweight may worsen symptoms. DIAGNOSIS  Your caregiver will perform a physical exam to look for common symptoms of sciatica. He or she may ask you to do certain movements or activities that would trigger sciatic nerve pain. Other tests may be performed to find the cause of the sciatica. These may include:  Blood tests.  X-rays.  Imaging tests, such as an MRI or CT scan. TREATMENT  Treatment is directed at the cause of the sciatic pain. Sometimes, treatment is not necessary and the pain and discomfort goes away on its own. If treatment is needed, your caregiver may suggest:  Over-the-counter medicines to relieve pain.  Prescription medicines, such as anti-inflammatory medicine, muscle relaxants, or narcotics.  Applying heat or ice to the painful area.  Steroid injections to lessen pain, irritation, and inflammation around the nerve.  Reducing activity during periods of pain.  Exercising and stretching to strengthen your abdomen  and improve flexibility of your spine. Your caregiver may suggest losing weight if the extra weight makes the back pain worse.  Physical therapy.  Surgery to eliminate what is pressing or pinching the nerve, such as a bone spur or part of a herniated disk. HOME CARE INSTRUCTIONS   Only take over-the-counter or prescription medicines for pain or discomfort as directed by your caregiver.  Apply ice to the affected area for 20 minutes, 3 4 times a day for the first 48 72 hours. Then try heat in  the same way.  Exercise, stretch, or perform your usual activities if these do not aggravate your pain.  Attend physical therapy sessions as directed by your caregiver.  Keep all follow-up appointments as directed by your caregiver.  Do not wear high heels or shoes that do not provide proper support.  Check your mattress to see if it is too soft. A firm mattress may lessen your pain and discomfort. SEEK IMMEDIATE MEDICAL CARE IF:   You lose control of your bowel or bladder (incontinence).  You have increasing weakness in the lower back, pelvis, buttocks, or legs.  You have redness or swelling of your back.  You have a burning sensation when you urinate.  You have pain that gets worse when you lie down or awakens you at night.  Your pain is worse than you have experienced in the past.  Your pain is lasting longer than 4 weeks.  You are suddenly losing weight without reason. MAKE SURE YOU:  Understand these instructions.  Will watch your condition.  Will get help right away if you are not doing well or get worse. Document Released: 02/24/2001 Document Revised: 09/01/2011 Document Reviewed: 07/12/2011 [facility] Patient Information 2014 [place].   Sciatica with Rehab The sciatic nerve runs from the back down the leg and is responsible for sensation and control of the muscles in the back (posterior) side of the thigh, lower leg, and foot. Sciatica is a condition that is characterized by inflammation of this nerve.  SYMPTOMS   Signs of nerve damage, including numbness and/or weakness along the posterior side of the lower extremity.  Pain in the back of the thigh that may also travel down the leg.  Pain that worsens when sitting for long periods of time.  Occasionally, pain in the back or buttock. CAUSES  Inflammation of the sciatic nerve is the cause of sciatica. The inflammation is due to something irritating the nerve. Common sources of irritation  include:  Sitting for long periods of time.  Direct trauma to the nerve.  Arthritis of the spine.  Herniated or ruptured disk.  Slipping of the vertebrae (spondylolithesis)  Pressure from soft tissues, such as muscles or ligament-like tissue (fascia). RISK INCREASES WITH:  Sports that place pressure or stress on the spine (football or weightlifting).  Poor strength and flexibility.  Failure to warm-up properly before activity.  Family history of low back pain or disk disorders.  Previous back injury or surgery.  Poor body mechanics, especially when lifting, or poor posture. PREVENTION   Warm up and stretch properly before activity.  Maintain physical fitness:  Strength, flexibility, and endurance.  Cardiovascular fitness.  Learn and use proper technique, especially with posture and lifting. When possible, have coach correct improper technique.  Avoid activities that place stress on the spine. PROGNOSIS If treated properly, then sciatica usually resolves within 6 weeks. However, occasionally surgery is necessary.  RELATED COMPLICATIONS   Permanent nerve damage, including pain, numbness, tingle,  or weakness.  Chronic back pain.  Risks of surgery: infection, bleeding, nerve damage, or damage to surrounding tissues. TREATMENT Treatment initially involves resting from any activities that aggravate your symptoms. The use of ice and medication may help reduce pain and inflammation. The use of strengthening and stretching exercises may help reduce pain with activity. These exercises may be performed at home or with referral to a therapist. A therapist may recommend further treatments, such as transcutaneous electronic nerve stimulation (TENS) or ultrasound. Your caregiver may recommend corticosteroid injections to help reduce inflammation of the sciatic nerve. If symptoms persist despite non-surgical (conservative) treatment, then surgery may be recommended. MEDICATION  If  pain medication is necessary, then nonsteroidal anti-inflammatory medications, such as aspirin and ibuprofen, or other minor pain relievers, such as acetaminophen, are often recommended.  Do not take pain medication for 7 days before surgery.  Prescription pain relievers may be given if deemed necessary by your caregiver. Use only as directed and only as much as you need.  Ointments applied to the skin may be helpful.  Corticosteroid injections may be given by your caregiver. These injections should be reserved for the most serious cases, because they may only be given a certain number of times. HEAT AND COLD  Cold treatment (icing) relieves pain and reduces inflammation. Cold treatment should be applied for 10 to 15 minutes every 2 to 3 hours for inflammation and pain and immediately after any activity that aggravates your symptoms. Use ice packs or massage the area with a piece of ice (ice massage).  Heat treatment may be used prior to performing the stretching and strengthening activities prescribed by your caregiver, physical therapist, or athletic trainer. Use a heat pack or soak the injury in warm water. SEEK MEDICAL CARE IF:  Treatment seems to offer no benefit, or the condition worsens.  Any medications produce adverse side effects. EXERCISES  RANGE OF MOTION (ROM) AND STRETCHING EXERCISES - Sciatica Most people with sciatic will find that their symptoms worsen with either excessive bending forward (flexion) or arching at the low back (extension). The exercises which will help resolve your symptoms will focus on the opposite motion. Your physician, physical therapist or athletic trainer will help you determine which exercises will be most helpful to resolve your low back pain. Do not complete any exercises without first consulting with your clinician. Discontinue any exercises which worsen your symptoms until you speak to your clinician. If you have pain, numbness or tingling which  travels down into your buttocks, leg or foot, the goal of the therapy is for these symptoms to move closer to your back and eventually resolve. Occasionally, these leg symptoms will get better, but your low back pain may worsen; this is typically an indication of progress in your rehabilitation. Be certain to be very alert to any changes in your symptoms and the activities in which you participated in the 24 hours prior to the change. Sharing this information with your clinician will allow him/her to most efficiently treat your condition. These exercises may help you when beginning to rehabilitate your injury. Your symptoms may resolve with or without further involvement from your physician, physical therapist or athletic trainer. While completing these exercises, remember:   Restoring tissue flexibility helps normal motion to return to the joints. This allows healthier, less painful movement and activity.  An effective stretch should be held for at least 30 seconds.  A stretch should never be painful. You should only feel a gentle lengthening or release  in the stretched tissue. FLEXION RANGE OF MOTION AND STRETCHING EXERCISES: STRETCH  Flexion, Single Knee to Chest   Lie on a firm bed or floor with both legs extended in front of you.  Keeping one leg in contact with the floor, bring your opposite knee to your chest. Hold your leg in place by either grabbing behind your thigh or at your knee.  Pull until you feel a gentle stretch in your low back. Hold __________ seconds.  Slowly release your grasp and repeat the exercise with the opposite side. Repeat __________ times. Complete this exercise __________ times per day.  STRETCH  Flexion, Double Knee to Chest  Lie on a firm bed or floor with both legs extended in front of you.  Keeping one leg in contact with the floor, bring your opposite knee to your chest.  Tense your stomach muscles to support your back and then lift your other knee to  your chest. Hold your legs in place by either grabbing behind your thighs or at your knees.  Pull both knees toward your chest until you feel a gentle stretch in your low back. Hold __________ seconds.  Tense your stomach muscles and slowly return one leg at a time to the floor. Repeat __________ times. Complete this exercise __________ times per day.  STRETCH  Low Trunk Rotation   Lie on a firm bed or floor. Keeping your legs in front of you, bend your knees so they are both pointed toward the ceiling and your feet are flat on the floor.  Extend your arms out to the side. This will stabilize your upper body by keeping your shoulders in contact with the floor.  Gently and slowly drop both knees together to one side until you feel a gentle stretch in your low back. Hold for __________ seconds.  Tense your stomach muscles to support your low back as you bring your knees back to the starting position. Repeat the exercise to the other side. Repeat __________ times. Complete this exercise __________ times per day  EXTENSION RANGE OF MOTION AND FLEXIBILITY EXERCISES: STRETCH  Extension, Prone on Elbows  Lie on your stomach on the floor, a bed will be too soft. Place your palms about shoulder width apart and at the height of your head.  Place your elbows under your shoulders. If this is too painful, stack pillows under your chest.  Allow your body to relax so that your hips drop lower and make contact more completely with the floor.  Hold this position for __________ seconds.  Slowly return to lying flat on the floor. Repeat __________ times. Complete this exercise __________ times per day.  RANGE OF MOTION  Extension, Prone Press Ups  Lie on your stomach on the floor, a bed will be too soft. Place your palms about shoulder width apart and at the height of your head.  Keeping your back as relaxed as possible, slowly straighten your elbows while keeping your hips on the floor. You may adjust  the placement of your hands to maximize your comfort. As you gain motion, your hands will come more underneath your shoulders.  Hold this position __________ seconds.  Slowly return to lying flat on the floor. Repeat __________ times. Complete this exercise __________ times per day.  STRENGTHENING EXERCISES - Sciatica  These exercises may help you when beginning to rehabilitate your injury. These exercises should be done near your "sweet spot." This is the neutral, low-back arch, somewhere between fully rounded and fully arched,  that is your least painful position. When performed in this safe range of motion, these exercises can be used for people who have either a flexion or extension based injury. These exercises may resolve your symptoms with or without further involvement from your physician, physical therapist or athletic trainer. While completing these exercises, remember:   Muscles can gain both the endurance and the strength needed for everyday activities through controlled exercises.  Complete these exercises as instructed by your physician, physical therapist or athletic trainer. Progress with the resistance and repetition exercises only as your caregiver advises.  You may experience muscle soreness or fatigue, but the pain or discomfort you are trying to eliminate should never worsen during these exercises. If this pain does worsen, stop and make certain you are following the directions exactly. If the pain is still present after adjustments, discontinue the exercise until you can discuss the trouble with your clinician. STRENGTHENING Deep Abdominals, Pelvic Tilt   Lie on a firm bed or floor. Keeping your legs in front of you, bend your knees so they are both pointed toward the ceiling and your feet are flat on the floor.  Tense your lower abdominal muscles to press your low back into the floor. This motion will rotate your pelvis so that your tail bone is scooping upwards rather than  pointing at your feet or into the floor.  With a gentle tension and even breathing, hold this position for __________ seconds. Repeat __________ times. Complete this exercise __________ times per day.  STRENGTHENING  Abdominals, Crunches   Lie on a firm bed or floor. Keeping your legs in front of you, bend your knees so they are both pointed toward the ceiling and your feet are flat on the floor. Cross your arms over your chest.  Slightly tip your chin down without bending your neck.  Tense your abdominals and slowly lift your trunk high enough to just clear your shoulder blades. Lifting higher can put excessive stress on the low back and does not further strengthen your abdominal muscles.  Control your return to the starting position. Repeat __________ times. Complete this exercise __________ times per day.  STRENGTHENING  Quadruped, Opposite UE/LE Lift  Assume a hands and knees position on a firm surface. Keep your hands under your shoulders and your knees under your hips. You may place padding under your knees for comfort.  Find your neutral spine and gently tense your abdominal muscles so that you can maintain this position. Your shoulders and hips should form a rectangle that is parallel with the floor and is not twisted.  Keeping your trunk steady, lift your right hand no higher than your shoulder and then your left leg no higher than your hip. Make sure you are not holding your breath. Hold this position __________ seconds.  Continuing to keep your abdominal muscles tense and your back steady, slowly return to your starting position. Repeat with the opposite arm and leg. Repeat __________ times. Complete this exercise __________ times per day.  STRENGTHENING  Abdominals and Quadriceps, Straight Leg Raise   Lie on a firm bed or floor with both legs extended in front of you.  Keeping one leg in contact with the floor, bend the other knee so that your foot can rest flat on the  floor.  Find your neutral spine, and tense your abdominal muscles to maintain your spinal position throughout the exercise.  Slowly lift your straight leg off the floor about 6 inches for a count of  15, making sure to not hold your breath.  Still keeping your neutral spine, slowly lower your leg all the way to the floor. Repeat this exercise with each leg __________ times. Complete this exercise __________ times per day. POSTURE AND BODY MECHANICS CONSIDERATIONS - Sciatica Keeping correct posture when sitting, standing or completing your activities will reduce the stress put on different body tissues, allowing injured tissues a chance to heal and limiting painful experiences. The following are general guidelines for improved posture. Your physician or physical therapist will provide you with any instructions specific to your needs. While reading these guidelines, remember:  The exercises prescribed by your provider will help you have the flexibility and strength to maintain correct postures.  The correct posture provides the optimal environment for your joints to work. All of your joints have less wear and tear when properly supported by a spine with good posture. This means you will experience a healthier, less painful body.  Correct posture must be practiced with all of your activities, especially prolonged sitting and standing. Correct posture is as important when doing repetitive low-stress activities (typing) as it is when doing a single heavy-load activity (lifting). RESTING POSITIONS Consider which positions are most painful for you when choosing a resting position. If you have pain with flexion-based activities (sitting, bending, stooping, squatting), choose a position that allows you to rest in a less flexed posture. You would want to avoid curling into a fetal position on your side. If your pain worsens with extension-based activities (prolonged standing, working overhead), avoid resting  in an extended position such as sleeping on your stomach. Most people will find more comfort when they rest with their spine in a more neutral position, neither too rounded nor too arched. Lying on a non-sagging bed on your side with a pillow between your knees, or on your back with a pillow under your knees will often provide some relief. Keep in mind, being in any one position for a prolonged period of time, no matter how correct your posture, can still lead to stiffness. PROPER SITTING POSTURE In order to minimize stress and discomfort on your spine, you must sit with correct posture Sitting with good posture should be effortless for a healthy body. Returning to good posture is a gradual process. Many people can work toward this most comfortably by using various supports until they have the flexibility and strength to maintain this posture on their own. When sitting with proper posture, your ears will fall over your shoulders and your shoulders will fall over your hips. You should use the back of the chair to support your upper back. Your low back will be in a neutral position, just slightly arched. You may place a small pillow or folded towel at the base of your low back for support.  When working at a desk, create an environment that supports good, upright posture. Without extra support, muscles fatigue and lead to excessive strain on joints and other tissues. Keep these recommendations in mind: CHAIR:   A chair should be able to slide under your desk when your back makes contact with the back of the chair. This allows you to work closely.  The chair's height should allow your eyes to be level with the upper part of your monitor and your hands to be slightly lower than your elbows. BODY POSITION  Your feet should make contact with the floor. If this is not possible, use a foot rest.  Keep your ears over your shoulders.  This will reduce stress on your neck and low back. INCORRECT SITTING POSTURES    If you are feeling tired and unable to assume a healthy sitting posture, do not slouch or slump. This puts excessive strain on your back tissues, causing more damage and pain. Healthier options include:  Using more support, like a lumbar pillow.  Switching tasks to something that requires you to be upright or walking.  Talking a brief walk.  Lying down to rest in a neutral-spine position. PROLONGED STANDING WHILE SLIGHTLY LEANING FORWARD  When completing a task that requires you to lean forward while standing in one place for a long time, place either foot up on a stationary 2-4 inch high object to help maintain the best posture. When both feet are on the ground, the low back tends to lose its slight inward curve. If this curve flattens (or becomes too large), then the back and your other joints will experience too much stress, fatigue more quickly and can cause pain.  CORRECT STANDING POSTURES Proper standing posture should be assumed with all daily activities, even if they only take a few moments, like when brushing your teeth. As in sitting, your ears should fall over your shoulders and your shoulders should fall over your hips. You should keep a slight tension in your abdominal muscles to brace your spine. Your tailbone should point down to the ground, not behind your body, resulting in an over-extended swayback posture.  INCORRECT STANDING POSTURES  Common incorrect standing postures include a forward head, locked knees and/or an excessive swayback. WALKING Walk with an upright posture. Your ears, shoulders and hips should all line-up. PROLONGED ACTIVITY IN A FLEXED POSITION When completing a task that requires you to bend forward at your waist or lean over a low surface, try to find a way to stabilize 3 of 4 of your limbs. You can place a hand or elbow on your thigh or rest a knee on the surface you are reaching across. This will provide you more stability so that your muscles do not  fatigue as quickly. By keeping your knees relaxed, or slightly bent, you will also reduce stress across your low back. CORRECT LIFTING TECHNIQUES DO :   Assume a wide stance. This will provide you more stability and the opportunity to get as close as possible to the object which you are lifting.  Tense your abdominals to brace your spine; then bend at the knees and hips. Keeping your back locked in a neutral-spine position, lift using your leg muscles. Lift with your legs, keeping your back straight.  Test the weight of unknown objects before attempting to lift them.  Try to keep your elbows locked down at your sides in order get the best strength from your shoulders when carrying an object.  Always ask for help when lifting heavy or awkward objects. INCORRECT LIFTING TECHNIQUES DO NOT:   Lock your knees when lifting, even if it is a small object.  Bend and twist. Pivot at your feet or move your feet when needing to change directions.  Assume that you cannot safely pick up a paperclip without proper posture. Document Released: 03/02/2005 Document Revised: 05/25/2011 Document Reviewed: 06/14/2008 [facility] Patient Information 2014 [place], [place]. To continue present medication Monitor BP

## 2013-05-23 NOTE — Progress Notes (Signed)
   Subjective:    Patient ID: Mandy Durham, female    DOB: 1946/01/19, 68 y.o.   MRN: [id number]  HPI Mrs. Tesch presents for follow up of syncope and collapse. IN the interval since her last visit she has had cardiology evaluation with Dr. Aundra Dubin: working diagnosis is  Orthostatic hypotension. She has been started on florinef with good results.   She is otherwise doing well w/ no new complaints.  PMH, FamHx and SocHx reviewed for any changes and relevance.  Current Outpatient Prescriptions on File Prior to Visit  Medication Sig Dispense Refill  . Calcium Carbonate (CALTRATE 600 PO) Take 1,800 Units by mouth daily.       . cholecalciferol (VITAMIN D) 1000 UNITS tablet Take 1,000 Units by mouth daily.      . fish oil-omega-3 fatty acids 1000 MG capsule Take 1 g by mouth daily.       . fludrocortisone (FLORINEF) 0.1 MG tablet Take 1 tablet (0.1 mg total) by mouth every morning.  30 tablet  6  . metoprolol succinate (TOPROL XL) 25 MG 24 hr tablet Take 1 tablet (25 mg total) by mouth daily.  30 tablet  6  . Venlafaxine HCl 75 MG TB24 Take 1 tablet (75 mg total) by mouth daily. For hot flashes.  30 each  5  . zolpidem (AMBIEN) 10 MG tablet Take 1 tablet (10 mg total) by mouth at bedtime as needed for sleep.  30 tablet  5  . [DISCONTINUED] Estradiol (ESTRACE PO) Take by mouth daily.        . [DISCONTINUED] medroxyPROGESTERone (PROVERA) 2.5 MG tablet Take 2.5 mg by mouth daily.         No current facility-administered medications on file prior to visit.      Review of Systems System review is negative for any constitutional, cardiac, pulmonary, GI or neuro symptoms or complaints other than as described in the HPI.     Objective:   Physical Exam Filed Vitals:   05/22/13 1152  BP: 134/90  Pulse: 77  Temp: 97.6 F (36.4 C)   Gen'l - WNWD woman in no distress HEENT - normal Cor - 2+ radial, RRR Pulm - normal respirations Neuro - A&O x 3, normal gait and station.         Assessment & Plan:

## 2013-05-23 NOTE — Assessment & Plan Note (Signed)
Reviewed Dr. Claris Gladden note. Abreanna reports that she is doing [facility] better with the florinef on board and with increased fluid intake.  Plan Continue florinef   Monitor BP at home and call for any rising trend.

## 2013-06-21 ENCOUNTER — Encounter: Payer: Self-pay | Admitting: Cardiology

## 2013-06-21 ENCOUNTER — Ambulatory Visit (INDEPENDENT_AMBULATORY_CARE_PROVIDER_SITE_OTHER): Payer: Medicare HMO | Admitting: Cardiology

## 2013-06-21 ENCOUNTER — Other Ambulatory Visit: Payer: Medicare HMO

## 2013-06-21 VITALS — BP 166/81 | HR 70 | Ht 65.5 in | Wt 154.0 lb

## 2013-06-21 DIAGNOSIS — I447 Left bundle-branch block, unspecified: Secondary | ICD-10-CM

## 2013-06-21 DIAGNOSIS — R55 Syncope and collapse: Secondary | ICD-10-CM

## 2013-06-21 MED ORDER — PYRIDOSTIGMINE BROMIDE 60 MG PO TABS: 30.0000 mg | ORAL_TABLET | Freq: Three times a day (TID) | ORAL | Status: DC

## 2013-06-21 NOTE — Patient Instructions (Signed)
Start pyridostigmine 30mg  three times a day. This will be 1/2 of a 60mg  tablet three times a day.   Your physician has requested that you regularly monitor and record your blood pressure readings at home. Please use the same machine at the same time of day to check your readings and record them. I will call you in 2 weeks to get the readings. Eliot Ford  Your physician recommends that you schedule a follow-up appointment in: 6 weeks with Dr Aundra Dubin.

## 2013-06-22 NOTE — Progress Notes (Signed)
Patient ID: Mandy Durham, female   DOB: 05-08-1945, 68 y.o.   MRN: [id number] PCP: Dr. Linda Hedges  68 yo with history of chronic LBBB and syncope returns for cardiology followup. LBBB has been noted since at least 2000.  She had a cath in 2002 because of chest pain that showed no angiographic CAD.  I had her do an echocardiogram in 1/13 showed EF 50-55% with septal-lateral dyssynchrony consistent with LBBB.   For about 3 years, she has had episodes of syncope and presyncope.  She tends to get lightheaded with prolonged standing or walking at times.  Occasionally, she will get severely lightheaded and pass out or nearly pass out.  All episodes seem to have been while standing and have a prodrome of lightheadedness, nausea, and warmth.  No chest pain or tachypalpitations.  She does notice that when she drinks a lot of fluid, she does not feel as lightheaded.  No exertional dyspnea.  At last appointment, I started her on Florinef.  She thinks that it helped some but not markedly.  She passed out 2 weeks ago after walking up a flight of steps then bending over.  She was unable to wear compression stockings.  BP was high today at 166/81.   ECG: NSR, LBBB  Labs (4/12): LDL 80, HDL 123, TSH normal Labs (11/12): K 4, creatinine 0.8 Labs (8/14): LDL 107, HDL 102 Labs (11/14): K 3.4, creatinine 0.7  PMH: 1. Depression 2. Hyperlipidemia 3. Osteoarthritis 4. LBBB since around 2000.  LHC in 2002 showed normal coronaries.   5. Syncope: Holter 12/12 with rare PACS, HR range 64-140, average 82, no significant arrhythmias.  Echo (1/13): EF 50-55%, mild LVH, septal-lateral dyssynchrony c/w LBBB.  3-week event monitor (1/13): No significant arrhythmia.  EEG (11/14) normal.  6. H/o labyrinthitis  SH: Lives in [place], married, nonsmoker, works as an Biochemist, clinical.   FH: No CAD that she knows of.  No CHF.   ROS: All systems reviewed and negative except as per HPI.    Current Outpatient Prescriptions   Medication Sig Dispense Refill  . Calcium Carbonate (CALTRATE 600 PO) Take 1,800 Units by mouth daily.       . cholecalciferol (VITAMIN D) 1000 UNITS tablet Take 1,000 Units by mouth daily.      . fish oil-omega-3 fatty acids 1000 MG capsule Take 1 g by mouth daily.       . fludrocortisone (FLORINEF) 0.1 MG tablet Take 1 tablet (0.1 mg total) by mouth every morning.  30 tablet  6  . metoprolol succinate (TOPROL XL) 25 MG 24 hr tablet Take 1 tablet (25 mg total) by mouth daily.  30 tablet  6  . PARoxetine Mesylate (BRISDELLE) 7.5 MG CAPS Take by mouth daily.      Marland Kitchen zolpidem (AMBIEN) 10 MG tablet Take 1 tablet (10 mg total) by mouth at bedtime as needed for sleep.  30 tablet  5  . pyridostigmine (MESTINON) 60 MG tablet Take 0.5 tablets (30 mg total) by mouth 3 (three) times daily.  45 tablet  4  . [DISCONTINUED] Estradiol (ESTRACE PO) Take by mouth daily.        . [DISCONTINUED] medroxyPROGESTERone (PROVERA) 2.5 MG tablet Take 2.5 mg by mouth daily.         No current facility-administered medications for this visit.    BP 166/81  Pulse 70  Ht 5' 5.5" (1.664 m)  Wt 69.854 kg (154 lb)  BMI 25.23 kg/m2 General: NAD Neck: No JVD,  no thyromegaly or thyroid nodule.  Lungs: Clear to auscultation bilaterally with normal respiratory effort. CV: Nondisplaced PMI.  Heart regular S1/S2, no S3/S4, no murmur.  No peripheral edema.  No carotid bruit.  Normal pedal pulses.  Abdomen: Soft, nontender, no hepatosplenomegaly, no distention.  Neurologic: Alert and oriented x 3.  Psych: Normal affect. Extremities: No clubbing or cyanosis.   Assessment/Plan: 1. Syncope/presyncope: Always when standing, brief in duration.  Less symptomatic when she drinks a lot of fluid.  I suspect that these spells represent autonomic insufficiency/orthostatic hypotension.  Past event monitoring has shown no arrhythmia.  Florinef has helped some, but she had another syncopal episode about 2 weeks ago.  BP is high today, and  I am a bit leery about increasing Florinef with hypertension.   - She will continue 0.1 mg daily Florinef.   - I will have her add pyridostigmine at 30 mg tid for suspected orthostatic hypotension in the setting of supine hypertension.   - Followup in 6 weeks to see if she has had improvement. 2. LBBB: Chronic.  No ischemic symptoms.   Larey Dresser 06/22/2013

## 2013-06-28 ENCOUNTER — Other Ambulatory Visit (HOSPITAL_COMMUNITY): Payer: Self-pay | Admitting: Obstetrics and Gynecology

## 2013-06-28 DIAGNOSIS — Z1231 Encounter for screening mammogram for malignant neoplasm of breast: Secondary | ICD-10-CM

## 2013-07-13 ENCOUNTER — Telehealth: Payer: Self-pay | Admitting: *Deleted

## 2013-07-13 NOTE — Telephone Encounter (Signed)
Pt advised.

## 2013-07-13 NOTE — Telephone Encounter (Signed)
Continue no changes

## 2013-07-13 NOTE — Telephone Encounter (Signed)
Copied from 06/21/13 Dr Aundra Dubin office note: Syncope/presyncope: Always when standing, brief in duration. Less symptomatic when she drinks a lot of fluid. I suspect that these spells represent autonomic insufficiency/orthostatic hypotension. Past event monitoring has shown no arrhythmia. Florinef has helped some, but she had another syncopal episode about 2 weeks ago. BP is high today, and I am a bit leery about increasing Florinef with hypertension.  - She will continue 0.1 mg daily Florinef.  - I will have her add pyridostigmine at 30 mg tid for suspected orthostatic hypotension in the setting of supine hypertension.  07/13/13 Recent BP readings-- 136/62 156/81 163/82 145/77 129/76 134/68 158/93 147/78 Pt states she is feeling much better since starting pyridostigmine.  I will forward to Dr Aundra Dubin for review.

## 2013-07-17 ENCOUNTER — Ambulatory Visit (HOSPITAL_COMMUNITY)
Admission: RE | Admit: 2013-07-17 | Discharge: 2013-07-17 | Disposition: A | Discharge status: Home / Self Care | Payer: Medicare HMO | Source: Ambulatory Visit | Attending: Obstetrics and Gynecology | Admitting: Obstetrics and Gynecology

## 2013-07-17 DIAGNOSIS — Z1231 Encounter for screening mammogram for malignant neoplasm of breast: Secondary | ICD-10-CM

## 2013-07-21 ENCOUNTER — Ambulatory Visit (INDEPENDENT_AMBULATORY_CARE_PROVIDER_SITE_OTHER): Payer: Medicare HMO | Admitting: Internal Medicine

## 2013-07-21 ENCOUNTER — Ambulatory Visit (INDEPENDENT_AMBULATORY_CARE_PROVIDER_SITE_OTHER)
Admission: RE | Admit: 2013-07-21 | Discharge: 2013-07-21 | Disposition: A | Discharge status: Home / Self Care | Payer: Medicare HMO | Source: Ambulatory Visit | Attending: Internal Medicine | Admitting: Internal Medicine

## 2013-07-21 ENCOUNTER — Encounter: Payer: Self-pay | Admitting: Internal Medicine

## 2013-07-21 ENCOUNTER — Other Ambulatory Visit (INDEPENDENT_AMBULATORY_CARE_PROVIDER_SITE_OTHER): Payer: Medicare HMO

## 2013-07-21 VITALS — BP 180/100 | HR 70 | Temp 98.3°F | Resp 15 | Wt 154.8 lb

## 2013-07-21 DIAGNOSIS — R5383 Other fatigue: Secondary | ICD-10-CM

## 2013-07-21 DIAGNOSIS — R209 Unspecified disturbances of skin sensation: Secondary | ICD-10-CM

## 2013-07-21 DIAGNOSIS — R5381 Other malaise: Secondary | ICD-10-CM

## 2013-07-21 DIAGNOSIS — M533 Sacrococcygeal disorders, not elsewhere classified: Secondary | ICD-10-CM

## 2013-07-21 DIAGNOSIS — R202 Paresthesia of skin: Secondary | ICD-10-CM

## 2013-07-21 LAB — CBC WITH DIFFERENTIAL/PLATELET
BASOS ABS: 0.1 10*3/uL (ref 0.0–0.1)
Basophils Relative: 0.8 % (ref 0.0–3.0)
EOS ABS: 0.2 10*3/uL (ref 0.0–0.7)
Eosinophils Relative: 2.5 % (ref 0.0–5.0)
HCT: 40.1 % (ref 36.0–46.0)
HEMOGLOBIN: 13.4 g/dL (ref 12.0–15.0)
LYMPHS ABS: 2.7 10*3/uL (ref 0.7–4.0)
LYMPHS PCT: 31.2 % (ref 12.0–46.0)
MCHC: 33.3 g/dL (ref 30.0–36.0)
MCV: 86.3 fl (ref 78.0–100.0)
MONOS PCT: 6.2 % (ref 3.0–12.0)
Monocytes Absolute: 0.5 10*3/uL (ref 0.1–1.0)
NEUTROS ABS: 5.1 10*3/uL (ref 1.4–7.7)
Neutrophils Relative %: 59.3 % (ref 43.0–77.0)
PLATELETS: 344 10*3/uL (ref 150.0–400.0)
RBC: 4.65 Mil/uL (ref 3.87–5.11)
RDW: 13.7 % (ref 11.5–15.5)
WBC: 8.6 10*3/uL (ref 4.0–10.5)

## 2013-07-21 LAB — SEDIMENTATION RATE: Sed Rate: 19 mm/hr (ref 0–22)

## 2013-07-21 MED ORDER — GABAPENTIN 100 MG PO CAPS: ORAL_CAPSULE | ORAL | Status: DC

## 2013-07-21 NOTE — Patient Instructions (Signed)
Your next office appointment will be determined based upon review of your pending labs . Those instructions will be transmitted to you through My Chart  OR  by mail. Followup as needed for your acute issue. Please report any significant change in your symptoms. Assess response to the gabapentin one every 8 hours as needed. If it is partially beneficial, it can be increased up to a total of 3 pills every 8 hours as needed. This increase of 1 pill each dose  should take place over 72 hours at least.

## 2013-07-21 NOTE — Progress Notes (Signed)
Subjective:    Patient ID: Mandy Durham, female    DOB: 1945-08-25, 68 y.o.   MRN: [id number]  HPI  Symptoms began approximately two months ago without specific trigger or injury. Symptoms are described as dull pain lasting hours and now occurring almost every day for several hours to all day. The pain is localized at the most inferior aspect of the spine. She's had variable response to leave her Tylenol. The pain is mainly in the coccyx area and thighs. She describes it as if " my body was being screwed down". She also will have some discomfort in either hip. The combination of glucosamine, condroitin & MSM also was of minimal benefit. The symptoms in the thighs is described as " the skin pulling and burning". She feels this has impacted her gait with near stumbling. She has some urinary urgency and slight incontinence but no significant urine or bowel incontinence. She is on Mestinon from her Cardiologist for syncope. Her blood pressure is not monitored at home. She will have slight edema on occasion      Review of Systems  Chest pain, palpitations, tachycardia, exertional dyspnea, paroxysmal nocturnal dyspnea, or claudication are absent. She has some fatigue but no constitutional symptoms of fever, chills, sweats, or unexplained weight loss. She has no blurred vision, double vision, or vision loss. There is no history of melanoma, rectal bleeding, or diarrhea of significance. She does not describe numbness or tingling in the lower extremities but she does have some muscle discomfort. There has been no change in the color temperature the skin in the area and symptoms. She has no abnormal bruising or bleeding.     Objective:   Physical Exam  Significant or distinguishing  findings on physical exam include: Her speech is slightly halting and stuttering character. She has a grade one systolic murmur threaded base. With straight leg raising she has some discomfort in the right ischial  area of the pelvis. Otherwise straight leg raising is negative.  Gen.: Healthy and well-nourished in appearance. Alert, appropriate and cooperative throughout exam. Appears younger than stated age  Head: Normocephalic without obvious abnormalities Eyes: No corneal or conjunctival inflammation noted. Pupils equal round reactive to light and accommodation. Extraocular motion intact. No icterus Nose: External nasal exam reveals no deformity or inflammation. Nasal mucosa are pink and moist. No lesions or exudates noted.   Mouth: Oral mucosa and oropharynx reveal no lesions or exudates. Teeth in good repair. Neck: No deformities, masses, or tenderness noted. Range of motion & Thyroid normal. Lungs: Normal respiratory effort; chest expands symmetrically. Lungs are clear to auscultation without rales, wheezes, or increased work of breathing. Heart: Normal rate and rhythm. Normal S1 and S2. No gallop, click, or rub.  Abdomen: Bowel sounds normal; abdomen soft and nontender. No masses, organomegaly or hernias noted.                              Musculoskeletal/extremities: No deformity or scoliosis noted of  the thoracic or lumbar spine.  No clubbing, cyanosis, edema, or significant extremity  deformity noted. Range of motion normal .Tone & strength normal. Hand joints normal  Fingernail  health good. Able to lie down & sit up w/o help.  Vascular: Carotid, radial artery, dorsalis pedis and  posterior tibial pulses are full and equal. No bruits present. Neurologic: Alert and oriented x3. Deep tendon reflexes symmetrical and normal.  Gait normal  including heel & toe  walking .       Skin: Intact without suspicious lesions or rashes. Lymph: No cervical, axillaryymphadenopathy present. Psych: Mood and affect are normal. Normally interactive                                                                                          Assessment & Plan:  #1 Pain in the coccyx #2 Burning in the  thighs. #3 fatigue  Discussion: she may have spinal stenosis. MRI may be necessary as well as neurology consultation if there is suboptimal response to the gabapentin.  Please see orders and recommendations

## 2013-07-21 NOTE — Progress Notes (Signed)
Pre visit review using our clinic review tool, if applicable. No additional management support is needed unless otherwise documented below in the visit note. 

## 2013-07-24 LAB — BASIC METABOLIC PANEL
BUN: 15 mg/dL (ref 6–23)
CO2: 29 meq/L (ref 19–32)
Calcium: 9.3 mg/dL (ref 8.4–10.5)
Chloride: 104 mEq/L (ref 96–112)
Creatinine, Ser: 0.8 mg/dL (ref 0.4–1.2)
GFR: 80.24 mL/min (ref >60.00)
GLUCOSE: 93 mg/dL (ref 70–99)
Potassium: 3.8 mEq/L (ref 3.5–5.1)
SODIUM: 140 meq/L (ref 135–145)

## 2013-07-24 LAB — TSH: TSH: 1.71 u[IU]/mL (ref 0.35–4.50)

## 2013-07-29 ENCOUNTER — Encounter: Payer: Self-pay | Admitting: Internal Medicine

## 2013-08-08 ENCOUNTER — Other Ambulatory Visit: Payer: Self-pay | Admitting: Internal Medicine

## 2013-08-08 NOTE — Telephone Encounter (Signed)
Ok - note need for screening with [facility] prior to picking up refill (if not already done)  

## 2013-08-08 NOTE — Telephone Encounter (Signed)
Patient is requesting new script for ambien to be faxed over to [facility] on [address].  Patient is completley out of meds.

## 2013-08-09 MED ORDER — ZOLPIDEM TARTRATE 10 MG PO TABS: 10.0000 mg | ORAL_TABLET | Freq: Every evening | ORAL | Status: DC | PRN

## 2013-08-09 NOTE — Telephone Encounter (Signed)
Generated rx pt came in checking on status. Pt set-up with toxicology & rx given to pt...Mandy Durham

## 2013-08-10 ENCOUNTER — Ambulatory Visit (INDEPENDENT_AMBULATORY_CARE_PROVIDER_SITE_OTHER): Payer: Medicare HMO | Admitting: Cardiology

## 2013-08-10 ENCOUNTER — Encounter: Payer: Self-pay | Admitting: Cardiology

## 2013-08-10 VITALS — BP 150/80 | HR 70 | Ht 65.5 in | Wt 156.0 lb

## 2013-08-10 DIAGNOSIS — I10 Essential (primary) hypertension: Secondary | ICD-10-CM

## 2013-08-10 DIAGNOSIS — R55 Syncope and collapse: Secondary | ICD-10-CM

## 2013-08-10 MED ORDER — PYRIDOSTIGMINE BROMIDE 60 MG PO TABS: 30.0000 mg | ORAL_TABLET | Freq: Three times a day (TID) | ORAL | Status: DC

## 2013-08-10 MED ORDER — METOPROLOL SUCCINATE ER 25 MG PO TB24: 25.0000 mg | ORAL_TABLET | Freq: Every day | ORAL | Status: DC

## 2013-08-10 NOTE — Patient Instructions (Signed)
Stop Florinef. If you feel worse after you stop Florinef you can start taking it again.   Your physician wants you to follow-up in: 6 months with Dr Aundra Dubin. (November 2015).  You will receive a reminder letter in the mail two months in advance. If you don't receive a letter, please call our office to schedule the follow-up appointment.

## 2013-08-11 ENCOUNTER — Ambulatory Visit: Payer: Medicare HMO | Admitting: Family Medicine

## 2013-08-11 NOTE — Progress Notes (Signed)
Patient ID: Mandy Durham, female   DOB: 1945-07-02, 68 y.o.   MRN: [id number] PCP: Dr. Linna Darner  68 yo with history of chronic LBBB and syncope returns for cardiology followup. LBBB has been noted since at least 2000.  She had a cath in 2002 because of chest pain that showed no angiographic CAD.  I had her do an echocardiogram in 1/13 showed EF 50-55% with septal-lateral dyssynchrony consistent with LBBB.   For about 3 years, she has had episodes of syncope and presyncope.  She tends to get lightheaded with prolonged standing or walking at times.  Occasionally, she will get severely lightheaded and pass out or nearly pass out.  All episodes seem to have been while standing and have a prodrome of lightheadedness, nausea, and warmth.  No chest pain or tachypalpitations.  She does notice that when she drinks a lot of fluid, she does not feel as lightheaded.  No exertional dyspnea.  At a prior appointment, I started her on Florinef.  She does not think this helped much.  Her supine BP runs high and she had further episodes on Florinef.  Therefore, at last appointment, I had her start pyridostigmine.  This has helped considerably.  No further syncope or presyncope.  Only occasional mild lightheadedness.   ECG: NSR, LBBB  Labs (4/12): LDL 80, HDL 123, TSH normal Labs (11/12): K 4, creatinine 0.8 Labs (8/14): LDL 107, HDL 102 Labs (11/14): K 3.4, creatinine 0.7 Labs (5/15): K 3.8, creatinine 0.8, TSH normal  PMH: 1. Depression 2. Hyperlipidemia 3. Osteoarthritis 4. LBBB since around 2000.  LHC in 2002 showed normal coronaries.   5. Syncope: Holter 12/12 with rare PACS, HR range 64-140, average 82, no significant arrhythmias.  Echo (1/13): EF 50-55%, mild LVH, septal-lateral dyssynchrony c/w LBBB.  3-week event monitor (1/13): No significant arrhythmia.  EEG (11/14) normal.  Suspect autonomic insufficiency.  6. H/o labyrinthitis  SH: Lives in [place], married, nonsmoker, works as an Biochemist, clinical.    FH: No CAD that she knows of.  No CHF.   ROS: All systems reviewed and negative except as per HPI.    Current Outpatient Prescriptions  Medication Sig Dispense Refill  . Calcium Carbonate (CALTRATE 600 PO) Take 1,800 Units by mouth daily.       . cholecalciferol (VITAMIN D) 1000 UNITS tablet Take 1,000 Units by mouth daily.      Marland Kitchen gabapentin (NEURONTIN) 100 MG capsule One pill every eight hours as needed; dose may be increased by one pill each dose after 72 hours if only partially effective  30 capsule  2  . metoprolol succinate (TOPROL XL) 25 MG 24 hr tablet Take 1 tablet (25 mg total) by mouth daily.  90 tablet  2  . PARoxetine Mesylate (BRISDELLE) 7.5 MG CAPS Take by mouth daily.      Marland Kitchen pyridostigmine (MESTINON) 60 MG tablet Take 0.5 tablets (30 mg total) by mouth 3 (three) times daily.  135 tablet  2  . zolpidem (AMBIEN) 10 MG tablet Take 1 tablet (10 mg total) by mouth at bedtime as needed for sleep.  30 tablet  2  . [DISCONTINUED] Estradiol (ESTRACE PO) Take by mouth daily.        . [DISCONTINUED] medroxyPROGESTERone (PROVERA) 2.5 MG tablet Take 2.5 mg by mouth daily.         No current facility-administered medications for this visit.    BP 150/80  Pulse 70  Ht 5' 5.5" (1.664 m)  Wt 70.761 kg (  156 lb)  BMI 25.56 kg/m2 General: NAD Neck: No JVD, no thyromegaly or thyroid nodule.  Lungs: Clear to auscultation bilaterally with normal respiratory effort. CV: Nondisplaced PMI.  Heart regular S1/S2, no S3/S4, no murmur.  No peripheral edema.  No carotid bruit.  Normal pedal pulses.  Abdomen: Soft, nontender, no hepatosplenomegaly, no distention.  Neurologic: Alert and oriented x 3.  Psych: Normal affect. Extremities: No clubbing or cyanosis.   Assessment/Plan: 1. Syncope/presyncope: Always when standing, brief in duration.  I suspect that these spells represent autonomic insufficiency/orthostatic hypotension.  Past event monitoring has shown no arrhythmia.  Florinef did not  help much and BP is elevated.  Pyridostigmine has worked well, she is not having significant symptoms anymore.  - I think she can stop Florinef as her resting BP is up and it did not seem to help. - Continue pyridostigmine at 30 mg tid.   2. LBBB: Chronic.  No ischemic symptoms.   Followup in 6 months.   Larey Dresser 08/11/2013

## 2013-08-21 ENCOUNTER — Encounter: Payer: Self-pay | Admitting: Family Medicine

## 2013-08-21 ENCOUNTER — Telehealth: Payer: Self-pay | Admitting: Cardiology

## 2013-08-21 ENCOUNTER — Ambulatory Visit (INDEPENDENT_AMBULATORY_CARE_PROVIDER_SITE_OTHER): Payer: Medicare HMO | Admitting: Family Medicine

## 2013-08-21 VITALS — BP 150/77 | Ht 65.0 in | Wt 153.0 lb

## 2013-08-21 DIAGNOSIS — M79609 Pain in unspecified limb: Secondary | ICD-10-CM

## 2013-08-21 DIAGNOSIS — M549 Dorsalgia, unspecified: Secondary | ICD-10-CM

## 2013-08-21 DIAGNOSIS — M79604 Pain in right leg: Secondary | ICD-10-CM

## 2013-08-21 MED ORDER — NAPROXEN 500 MG PO TABS: 500.0000 mg | ORAL_TABLET | Freq: Two times a day (BID) | ORAL | Status: DC

## 2013-08-21 NOTE — Telephone Encounter (Signed)
Calling stating that she saw Dr. Dorcas Mcmurray today and mentioned to her that she has been having cramps in her legs for about 6 wks.  She felt like that may be side effect of Pyridostigmine.  Pt wants to know if there is another medication that she could take in place of that med.  States she has not having any fainting spells. Advised would forward to Dr. Aundra Dubin and his nurse Eliot Ford.

## 2013-08-21 NOTE — Telephone Encounter (Signed)
New message      Pt is having muscle cramps and thinks it is from the pyridostigmine 60mg  medication.  Please advise

## 2013-08-21 NOTE — Patient Instructions (Signed)
The thigh pain may be from the pyridostigmine. Let your cardiologist know about this.   For the right lower buttocks pain, we are going to use naproxen 500mg  twice a day with food for 3-4 weeks.   Follow up in 3-4 weeks.

## 2013-08-21 NOTE — Progress Notes (Signed)
Patient ID: Mandy Durham, female   DOB: Aug 03, 1945, 68 y.o.   MRN: [id number]  Mandy Durham - 67 y.o. female MRN [id number]  Date of birth: Jul 15, 1945    SUBJECTIVE:     2 issues #1. Bilateral thigh cramping that is intermittent. Can be either side effects both, rarely both at the same time however. Sometimes associated with activity such as kneeling but other times occurs when she's just sitting or walking. Pain is severe, 8-9/10. Can last for several minutes to 30 minutes. Pain is burning and lancinating , seems to be in both anterior and posterior thigh at the same time. She's not noticed any other muscle aches. This all started about a month or so ago. It has seemed to get a little worse.  #2. Lower back pain mostly on the right but also occasionally extending to the left. Can take an Aleve and reduce her pain level from 7/10-3/10. Does not radiate down her leg occasionally radiates to the right buttock area and makes it painful to sit. This occurs most days of the week and does not seem to be associated with activity. Has had no urinary or bowel incontinence or change. Other than the muscle aching listed in problem #1 above, she's had no lower extremity weakness, no falls. ROS:     No fever, sweats, chills, unusual weight change. She has history of dizziness thought to be secondary to orthostasis and her cardiologist recently put her on some new medicine for that which has seemed to significantly improve her dizzy symptoms. She's not had chest pain, shortness of breath, palpitations or unusual fatigue.  PERTINENT  PMH / PSH FH / / SH:  Past Medical, Surgical, Social, and Family History Reviewed & Updated per EMR.  Pertinent Historical Findings include:  Recently placed on Mestinon for some orthostatic symptoms. In retrospect she notes that the muscle cramps started soon after she started the Mestinon. Syncope and presyncope, left bundle-branch block and orthostatic symptoms followed by her  cardiologist. History of shingles outbreak in the low back area about in October of 2014. Her chart says area affected was T11-L4 on the right.   OBJECTIVE: BP 150/77  Ht 5\' 5"  (1.651 m)  Wt 153 lb (69.4 kg)  BMI 25.46 kg/m2  Physical Exam:  Vital signs are reviewed. GENERAL: Well-developed female no acute distress BACK: Nontender to palpation. There is no defect noted. There is no muscle spasm noted. SKIN: No lesions on the back other than normal everyday seborrheic keratosis and moles. I see no evidence of any recurrence of her shingles. HIPS: Bilaterally her international rotation is painless and full. Her hip flexor extensor strength is normal.  NEURO: Straight leg raise is bilaterally normal in seated and supine position. DTRs at the knee 1-2+ bilaterally equal MSK: Lower extremity muscle bulk, strength and tone is symmetrical and normal.  IMAGING: I reviewed her diagnostic pelvis from May, 2015. Both hip joints appear to have only mild arthritis with a few osteophytes noted. There still good joint space. The portion of the lumbar spine that is seen reveals an apparent curvature consistent with lumbar scoliosis. Lumbar spine is not seen in its entirety and there is only one AP view.  ASSESSMENT & PLAN:  See problem based charting & AVS for pt instructions.

## 2013-08-21 NOTE — Telephone Encounter (Signed)
Probably not pyridostigmine.  There is not another medication.

## 2013-08-22 DIAGNOSIS — M549 Dorsalgia, unspecified: Secondary | ICD-10-CM | POA: Insufficient documentation

## 2013-08-22 NOTE — Telephone Encounter (Signed)
Pt is aware that Dr. Aundra Dubin does not think Pyridostigmine is not causing the cramps, and there is not another medication to replaced this medication. Pt verbalized understanding and states she will tried  Something else to get rid of the cramps.

## 2013-08-22 NOTE — Assessment & Plan Note (Signed)
Will get dedicated spine films. I suspect her scoliosis and some mild DJD has caused her symptoms. They seem to be mild to moderate we will try NSAIDS for the next 3 weeks a regular basis. If her per cautions about GI upset and bleeding. See her back at that time. She'll call the interim with neuro worsening symptoms.

## 2013-10-06 ENCOUNTER — Ambulatory Visit (INDEPENDENT_AMBULATORY_CARE_PROVIDER_SITE_OTHER): Payer: Medicare HMO | Admitting: Family Medicine

## 2013-10-06 ENCOUNTER — Encounter: Payer: Self-pay | Admitting: Family Medicine

## 2013-10-06 VITALS — BP 148/76 | HR 67 | Ht 65.0 in | Wt 152.0 lb

## 2013-10-06 DIAGNOSIS — M549 Dorsalgia, unspecified: Secondary | ICD-10-CM

## 2013-10-06 DIAGNOSIS — M79604 Pain in right leg: Secondary | ICD-10-CM

## 2013-10-06 DIAGNOSIS — M5489 Other dorsalgia: Secondary | ICD-10-CM

## 2013-10-06 DIAGNOSIS — M79609 Pain in unspecified limb: Secondary | ICD-10-CM

## 2013-10-06 MED ORDER — NAPROXEN 500 MG PO TABS: 500.0000 mg | ORAL_TABLET | Freq: Two times a day (BID) | ORAL | Status: DC | PRN

## 2013-10-06 NOTE — Progress Notes (Signed)
Patient ID: Mandy Durham, female   DOB: [birth year], 68 y.o.   MRN: [id number] [place] Attending Note: I have seen and examined this patient. I have discussed this patient with the resident and reviewed the assessment and plan as documented above. I agree with the resident's findings and plan. 100% resolution of her pain. She's also started spinning class. Will transition to when necessary Naprosyn. Followup when necessary. I recommend continuing the spinning class.

## 2013-10-06 NOTE — Progress Notes (Signed)
  Mandy Durham - 68 y.o. female MRN [id number]  Date of birth: June 24, 1945  SUBJECTIVE:     68 year old female present for followup regarding back pain.  Patient reports that her pain, which is located just lateral to her tailbone (right) is now resolved.  She states she's been taking naproxen daily as indicated and has noticed a complete resolution of her symptoms.  Additionally, she has been taking any spin class which she feels is also helping. She has no complaints currently and feels well.  She is very thankful that her symptoms are now resolved.  ROS:     Per HPI with the following additions: No pain, muscle weakness, urinary incontinence, or saddle anesthesia.  PERTINENT  PMH / PSH FH / / SH:  Past Medical, Surgical, Social, and Family History Reviewed.  OBJECTIVE: BP 148/76  Pulse 67  Ht 5\' 5"  (1.651 m)  Wt 152 lb (68.947 kg)  BMI 25.29 kg/m2  Physical Exam:  Vital signs are reviewed. General: well appearing elderly female in NAD.  Back Exam: - Inspection - Normal - ROM - Full ROM. - No areas of tenderness to palpation today.  Pain currently resolved. - Normal muscle strength.  ASSESSMENT & PLAN: See problem based charting & AVS for pt instructions.

## 2013-10-06 NOTE — Assessment & Plan Note (Signed)
Patient is doing well and symptoms are now resolved. Advised patient to continue spin class and use naproxen twice a day as needed. Followup if pain recurs or worsens.

## 2013-11-06 ENCOUNTER — Encounter: Payer: Self-pay | Admitting: Family Medicine

## 2013-11-06 ENCOUNTER — Ambulatory Visit (INDEPENDENT_AMBULATORY_CARE_PROVIDER_SITE_OTHER): Payer: Medicare HMO | Admitting: Family Medicine

## 2013-11-06 VITALS — BP 182/75 | Ht 65.5 in | Wt 152.0 lb

## 2013-11-06 DIAGNOSIS — T148XXA Other injury of unspecified body region, initial encounter: Secondary | ICD-10-CM

## 2013-11-10 ENCOUNTER — Encounter: Payer: Self-pay | Admitting: Family Medicine

## 2013-11-10 NOTE — Progress Notes (Signed)
Patient ID: Mandy Durham, female   DOB: 1945/10/26, 68 y.o.   MRN: [id number]  Mandy Durham - 68 y.o. female MRN [id number]  Date of birth: March 04, 1946    SUBJECTIVE:     Complaint of pain in her thigh that she notices particularly in the morning. Right thigh, not the left. Resolve so she does activities during the day. She has no pain when she is doing her exercise which is currently a spin class. Does not notice any significant pain an hour to after spin class that is having morning pain. No cramping or pain in her calves no feeling like her leg is going to give way. ROS:     Other than this area, she's not having any unusual arthralgias or myalgias. No weight loss is unusual. No unusual fatigue. No fever, sweats, chills.  PERTINENT  PMH / PSH FH / / SH:  Past Medical, Surgical, Social, and Family History Reviewed & Updated in the EMR.  Pertinent findings include:  Hypertension, hyperlipidemia, history of adenomatous benign colon polyps. History of anxiety depression and insomnia.  OBJECTIVE: BP 182/75  Ht 5' 5.5" (1.664 m)  Wt 152 lb (68.947 kg)  BMI 24.90 kg/m2  Physical Exam:  Vital signs are reviewed. GENERAL: Well-developed female no acute distress HIPS: Slight decrease in internal rotation of left hip but it is painless and otherwise her hips is normal range of motion. Normal hip flexor strength.  MSK: The quadricep muscle is intact with good muscle bulk and tone is symmetrical flat on the right. Normal strength of hip flexors and knee extensors. Very mildly tender to deep palpation of the proximal quadriceps for Morris bilaterally.  ASSESSMENT & PLAN: Mild muscle strain. We discussed ways to stretch. I don't think this is anything worrisome. I would continue her current activities without restrictions. See problem based charting & AVS for pt instructions.

## 2013-11-14 ENCOUNTER — Other Ambulatory Visit (INDEPENDENT_AMBULATORY_CARE_PROVIDER_SITE_OTHER): Payer: Medicare HMO

## 2013-11-14 ENCOUNTER — Ambulatory Visit (INDEPENDENT_AMBULATORY_CARE_PROVIDER_SITE_OTHER): Payer: Medicare HMO | Admitting: Internal Medicine

## 2013-11-14 ENCOUNTER — Encounter: Payer: Self-pay | Admitting: Internal Medicine

## 2013-11-14 VITALS — BP 114/72 | HR 75 | Temp 98.3°F | Resp 14 | Ht 65.5 in | Wt 152.0 lb

## 2013-11-14 DIAGNOSIS — R7303 Prediabetes: Secondary | ICD-10-CM

## 2013-11-14 DIAGNOSIS — B351 Tinea unguium: Secondary | ICD-10-CM

## 2013-11-14 DIAGNOSIS — R55 Syncope and collapse: Secondary | ICD-10-CM

## 2013-11-14 DIAGNOSIS — R7301 Impaired fasting glucose: Secondary | ICD-10-CM

## 2013-11-14 DIAGNOSIS — R7309 Other abnormal glucose: Secondary | ICD-10-CM

## 2013-11-14 DIAGNOSIS — I10 Essential (primary) hypertension: Secondary | ICD-10-CM

## 2013-11-14 DIAGNOSIS — Z23 Encounter for immunization: Secondary | ICD-10-CM

## 2013-11-14 LAB — HEMOGLOBIN A1C: HEMOGLOBIN A1C: 6.3 % (ref 4.6–6.5)

## 2013-11-14 MED ORDER — TERBINAFINE HCL 250 MG PO TABS: 250.0000 mg | ORAL_TABLET | Freq: Every day | ORAL | Status: DC

## 2013-11-14 NOTE — Assessment & Plan Note (Addendum)
Will recheck HgA1c today and she has been trying to increase her exercise level with spinning.

## 2013-11-14 NOTE — Progress Notes (Signed)
   Subjective:    Patient ID: Mandy Durham, female    DOB: [birth year], 68 y.o.   MRN: [id number]  HPI The patient is here today to establish care with a new provider. She is a 68 YO female with PMH of syncope (thought to be related to orthostatic hypotension), borderline diabetes, hypertension, left bundle branch block. She is not having any new complaints today. She is doing well with her mood and has not had any problems with falling or syncope. She continues to see her cardiologist and is due to see them back soon. She is also working on improving her exercise and is starting spinning classes and thinks this is helping her a lot. She only uses naproxen when she has a problem with aches or pains but not frequently. She also uses ambien to sleep occasionally (takes 1/2 tab). She does have some toenail fungus and this is bothersome to her and she would like to try treatment. She has tried something in the past. She is a non-smoker and does wish to have the flu shot today. She denies any chest pains or SOB. She does occasionally have constipation but is able to eat foods to naturally help her without need of medicinal aid and has not had change in her bowel habits her whole life. She does not see any blood in her stools.    Review of Systems  Constitutional: Negative for fever, chills, activity change, appetite change, fatigue and unexpected weight change.  Respiratory: Negative for cough, chest tightness, shortness of breath and wheezing.   Cardiovascular: Negative for chest pain, palpitations and leg swelling.  Gastrointestinal: Negative for nausea, vomiting, abdominal pain, diarrhea and constipation.  Musculoskeletal: Negative for gait problem and myalgias.  Skin: Positive for wound.       Toenail great toe fungus.  Neurological: Negative for dizziness, tremors, syncope, weakness, light-headedness and headaches.  Psychiatric/Behavioral: Positive for sleep disturbance. Negative for suicidal ideas,  confusion, self-injury, decreased concentration and agitation. The patient is not nervous/anxious.        Objective:   Physical Exam  Constitutional: She is oriented to person, place, and time. She appears well-developed and well-nourished. No distress.  HENT:  Head: Normocephalic and atraumatic.  Eyes: EOM are normal. Pupils are equal, round, and reactive to light.  Neck: Normal range of motion. Neck supple.  Cardiovascular: Normal rate.   Murmur heard. Pulmonary/Chest: Effort normal and breath sounds normal. No respiratory distress. She has no wheezes. She has no rales. She exhibits no tenderness.  Abdominal: Soft. Bowel sounds are normal. She exhibits no distension. There is no tenderness. There is no rebound and no guarding.  Musculoskeletal: Normal range of motion. She exhibits no edema and no tenderness.  Neurological: She is alert and oriented to person, place, and time. No cranial nerve deficit.  Skin: Skin is warm and dry. She is not diaphoretic.          Assessment & Plan:

## 2013-11-14 NOTE — Assessment & Plan Note (Addendum)
BP 114/72 good today with toprol-xl 25 mg daily. No change and will monitor.

## 2013-11-14 NOTE — Patient Instructions (Signed)
We will give you lamisil for your toenail. Take 1 pill a day for 3 months to ensure that the infection will be gone fully. We will also check on your sugar levels with a HgA1c. We will call you with your labs. If you are doing well you can come back in 6 months. If you have a problem before then please call our office. You can call for refills or request them on the computer.   Preventing Toenail Fungus from Recurring   Sanitize your shoes with Mycomist spray or a similar shoe sanitizer spray.  Follow the instructions on the bottle and dry them outside in the sun or with a hairdryer.  We also recommend repeating the sanitization once weekly in shoes you wear most often.   Throw away any shoes you have worn a significant amount without socks-fungus thrives in a warm moist environment and you want to avoid re-infection after your laser procedure   Bleach your socks with regular or color safe bleach   Change your socks regularly to keep your feet clean and dry (especially if you have sweaty feet)-if sweaty feet are a problem, let your doctor know-there is a great lotion that helps with this problem.   Clean your toenail clippers with alcohol before you use them if you do your own toenails and make sure to replace Emory boards and orange sticks regularly   If you get regular pedicures, bring your own instruments or go to a spa that sterilizes their instruments in an autoclave.

## 2013-11-14 NOTE — Progress Notes (Signed)
Pre visit review using our clinic review tool, if applicable. No additional management support is needed unless otherwise documented below in the visit note. 

## 2013-11-14 NOTE — Assessment & Plan Note (Addendum)
Will allow her cardiologist to manage this as was thought to be related to orthostatic hypotension. She was on florinef for awhile and unclear why change to pyridostigmine.

## 2013-11-15 DIAGNOSIS — B351 Tinea unguium: Secondary | ICD-10-CM | POA: Insufficient documentation

## 2013-11-15 NOTE — Assessment & Plan Note (Signed)
Patient does have fungus under her left great toenail and will rx lamisil 250 daily for 3 months to eradicate. She was also given information about preventing recurrence.

## 2013-11-17 ENCOUNTER — Telehealth: Payer: Self-pay | Admitting: Internal Medicine

## 2013-11-17 NOTE — Telephone Encounter (Signed)
Spoke with patient and answered her questions about Lamisil.

## 2013-11-17 NOTE — Telephone Encounter (Signed)
Pt called in an has serveal question about meds that Dr Doug Sou prepscribe yesterday.  She is just requesting a call back from nurse to help her.  [phone] is best number   Thank you!!

## 2013-12-05 ENCOUNTER — Other Ambulatory Visit: Payer: Self-pay

## 2013-12-05 MED ORDER — ZOLPIDEM TARTRATE 10 MG PO TABS: 10.0000 mg | ORAL_TABLET | Freq: Every evening | ORAL | Status: DC | PRN

## 2013-12-06 ENCOUNTER — Telehealth: Payer: Self-pay | Admitting: Internal Medicine

## 2013-12-06 ENCOUNTER — Other Ambulatory Visit: Payer: Self-pay | Admitting: Geriatric Medicine

## 2013-12-06 ENCOUNTER — Telehealth: Payer: Self-pay

## 2013-12-06 MED ORDER — ZOLPIDEM TARTRATE 5 MG PO TABS: 5.0000 mg | ORAL_TABLET | Freq: Every evening | ORAL | Status: DC | PRN

## 2013-12-06 NOTE — Telephone Encounter (Signed)
Would like ambien to be sent to [facility] on [address].

## 2013-12-06 NOTE — Telephone Encounter (Signed)
Can call in but will not refill 10 mg as this is too high for her age and sex.

## 2013-12-06 NOTE — Telephone Encounter (Signed)
Ambien Faxed to pharmacy and pt informed of same.

## 2013-12-06 NOTE — Telephone Encounter (Signed)
Called into pharmacy

## 2013-12-11 ENCOUNTER — Other Ambulatory Visit (INDEPENDENT_AMBULATORY_CARE_PROVIDER_SITE_OTHER): Payer: Medicare HMO

## 2013-12-11 ENCOUNTER — Encounter: Payer: Self-pay | Admitting: Family Medicine

## 2013-12-11 ENCOUNTER — Ambulatory Visit (INDEPENDENT_AMBULATORY_CARE_PROVIDER_SITE_OTHER): Payer: Medicare HMO | Admitting: Family Medicine

## 2013-12-11 VITALS — BP 128/80 | HR 64 | Ht 65.5 in | Wt 153.0 lb

## 2013-12-11 DIAGNOSIS — S86819A Strain of other muscle(s) and tendon(s) at lower leg level, unspecified leg, initial encounter: Secondary | ICD-10-CM

## 2013-12-11 DIAGNOSIS — S838X9A Sprain of other specified parts of unspecified knee, initial encounter: Secondary | ICD-10-CM

## 2013-12-11 DIAGNOSIS — M79609 Pain in unspecified limb: Secondary | ICD-10-CM

## 2013-12-11 DIAGNOSIS — M79605 Pain in left leg: Secondary | ICD-10-CM

## 2013-12-11 DIAGNOSIS — S86112A Strain of other muscle(s) and tendon(s) of posterior muscle group at lower leg level, left leg, initial encounter: Secondary | ICD-10-CM | POA: Insufficient documentation

## 2013-12-11 NOTE — Progress Notes (Signed)
  [name] [address] [place], [place] [address] Phone: [phone] Subjective:     CC: Left leg pain  [id number] Mandy Durham is a 68 y.o. female coming in with complaint of left leg pain. Patient states that this started approximately a month ago. Patient has started doing more bicycling recently. Patient states that she notices some discomfort mostly in the posterior aspect of her knee when this occurred. Patient states that it seemed to be more distal to the knee Patient states now when she is standing she has some mild discomfort on the lateral as well as anterior aspect of her leg just distal to the knee. Patient denies any swelling denies any numbness but states that the pain seems to be getting worse. Patient denies any significant radiation above-the-knee or any back pain that is associated with it. Patient has tried some over-the-counter medicines without any significant improvement but does get some good relief with icing at home. Patient states it only hurts when she does ambulate. No nighttime awakening. Basis severity of 6/10.     Past medical history, social, surgical and family history all reviewed in electronic medical record.   Review of Systems: No headache, visual changes, nausea, vomiting, diarrhea, constipation, dizziness, abdominal pain, skin rash, fevers, chills, night sweats, weight loss, swollen lymph nodes, body aches, joint swelling, muscle aches, chest pain, shortness of breath, mood changes.   Objective Blood pressure 128/80, pulse 64, height 5' 5.5" (1.664 m), weight 153 lb (69.4 kg), SpO2 98.00%.  General: No apparent distress alert and oriented x3 mood and affect normal, dressed appropriately.  HEENT: Pupils equal, extraocular movements intact  Respiratory: Patient's speak in full sentences and does not appear short of breath  Cardiovascular: No lower extremity edema, non tender, no erythema  Skin: Warm dry intact with no  signs of infection or rash on extremities or on axial skeleton.  Abdomen: Soft nontender  Neuro: Cranial nerves II through XII are intact, neurovascularly intact in all extremities with 2+ DTRs and 2+ pulses.  Lymph: No lymphadenopathy of posterior or anterior cervical chain or axillae bilaterally.  Gait normal with good balance and coordination.  MSK:  Non tender with full range of motion and good stability and symmetric strength and tone of shoulders, elbows, wrist, hip,  and ankles bilaterally.  Knee: Left Normal to inspection with no erythema or effusion or obvious bony abnormalities. Palpation normal with no warmth, joint line tenderness, patellar tenderness, or condyle tenderness. No is tender to palpation over that ROM full in flexion and extension and lower leg rotation. Ligaments with solid consistent endpoints including ACL, PCL, LCL, MCL. Negative Mcmurray's, Apley's, and Thessalonian tests. Non painful patellar compression. Patellar glide without crepitus. Patellar and quadriceps tendons unremarkable. Hamstring and quadriceps strength is normal.   Limited musculoskeletal ultrasound was performed and interpreted by me today. Limited ultrasound of the calf shows the patient does have a very small tear that seems to be healing with scar tissue. Mild hypoechoic changes still noted. Increasing Doppler flow noted.  Impression: Healing lateral gastrocnemius tear of the proximal head.   Impression and Recommendations:     This case required medical decision making of moderate complexity.

## 2013-12-11 NOTE — Patient Instructions (Signed)
Very nice to see you Ice 20 minutes 2 times daily Exercises 3 times a week.  Try the pennsaid twice daily Vitamin D 2000 IU daily Turmeric 500mg  twice daily.  Drop the seat on your bike, still should have at least a 30 degree bend in your knee.  Tennis shoes can be helpful as well.  Come back and see me again in 3 weeks.

## 2013-12-11 NOTE — Assessment & Plan Note (Signed)
Patient does have a strain and appears and did have a mild defect. Patient does not have any signs of any swelling at this time so I do not think further workup is necessary. Patient was given a compression sleeve, home exercises, icing protocol, and we discussed topical anti-inflammatories. Patient will try these interventions and come back and see me again in 3-4 weeks. Patient has any worsening pain we'll consider other further workup  Spent greater than 25 minutes with patient face-to-face and had greater than 50% of counseling including as described above in assessment and plan.

## 2013-12-13 ENCOUNTER — Other Ambulatory Visit: Payer: Self-pay | Admitting: Geriatric Medicine

## 2014-01-01 ENCOUNTER — Encounter: Payer: Self-pay | Admitting: Family Medicine

## 2014-01-01 ENCOUNTER — Ambulatory Visit (INDEPENDENT_AMBULATORY_CARE_PROVIDER_SITE_OTHER): Payer: Medicare HMO | Admitting: Family Medicine

## 2014-01-01 ENCOUNTER — Ambulatory Visit (INDEPENDENT_AMBULATORY_CARE_PROVIDER_SITE_OTHER)
Admission: RE | Admit: 2014-01-01 | Discharge: 2014-01-01 | Disposition: A | Discharge status: Home / Self Care | Payer: Medicare HMO | Source: Ambulatory Visit | Attending: Family Medicine | Admitting: Family Medicine

## 2014-01-01 VITALS — BP 140/80 | HR 73 | Ht 65.5 in | Wt 152.0 lb

## 2014-01-01 DIAGNOSIS — S86112D Strain of other muscle(s) and tendon(s) of posterior muscle group at lower leg level, left leg, subsequent encounter: Secondary | ICD-10-CM

## 2014-01-01 DIAGNOSIS — M25562 Pain in left knee: Secondary | ICD-10-CM

## 2014-01-01 DIAGNOSIS — S86812D Strain of other muscle(s) and tendon(s) at lower leg level, left leg, subsequent encounter: Secondary | ICD-10-CM

## 2014-01-01 MED ORDER — GABAPENTIN 100 MG PO CAPS: 200.0000 mg | ORAL_CAPSULE | Freq: Every day | ORAL | Status: DC

## 2014-01-01 NOTE — Assessment & Plan Note (Signed)
Patient is improving overall. We discussed icing regimen and I think will be beneficial. Patient will continue with the home exercises. Patient will followup with me in 3 weeks if not completely resolved. I do think that there is a possibility the patient is having pain as radiating from her knee. I do think that we need further workup for this. X-rays of patient's knee were ordered today. They are pending. Depending on the severity of the osteoarthritic changes of the knee we may need to consider different treatment options.

## 2014-01-01 NOTE — Patient Instructions (Signed)
Good to see you Gabapentin 100mg  at night for first week then 200mg  nightly thereafter.  Ice is your friend overall.  Lets get xray of your knee today to make sure everything is fine Wear the compression with exercise for another 2 weeks.  B12 1042mcg daily and B6 200mg  daily can help with energy Stretches for your back after spinning.  See me again in 3-4 weeks to make sure you continue to improve.

## 2014-01-01 NOTE — Progress Notes (Signed)
  [facility] [address] [place], [place] [address] Phone: [phone] Subjective:     CC: Left leg pain  [id number] Mandy Durham Mandy Durham Mandy Durham is a 68 y.o. female coming in with complaint of left leg pain. Patient was found to have a gastrocnemius tear as well as potentially some underlying osteophytic changes that were giving her trouble. Patient states that she is doing significantly better about 80 or 90% better after the conservative therapy. Patient has been doing the exercises fairly regularly. Patient has started doing her spin class again. Patient states that she is having some mild dull aching pain in her lower back bilaterally after this been class. Denies any radiation down the legs any numbness or tingling. Patient is wearing a compression sleeve on her left leg. Denies any weakness. States that overall she is fairly happy with the results.     Past medical history, social, surgical and family history all reviewed in electronic medical record.   Review of Systems: No headache, visual changes, nausea, vomiting, diarrhea, constipation, dizziness, abdominal pain, skin rash, fevers, chills, night sweats, weight loss, swollen lymph nodes, body aches, joint swelling, muscle aches, chest pain, shortness of breath, mood changes.   Objective Blood pressure 140/80, pulse 73, height 5' 5.5" (1.664 m), weight 152 lb (68.947 kg), SpO2 98.00%.  General: No apparent distress alert and oriented x3 mood and affect normal, dressed appropriately.  HEENT: Pupils equal, extraocular movements intact  Respiratory: Patient's speak in full sentences and does not appear short of breath  Cardiovascular: No lower extremity edema, non tender, no erythema  Skin: Warm dry intact with no signs of infection or rash on extremities or on axial skeleton.  Abdomen: Soft nontender  Neuro: Cranial nerves II through XII are intact, neurovascularly intact in all extremities with 2+ DTRs and 2+  pulses.  Lymph: No lymphadenopathy of posterior or anterior cervical chain or axillae bilaterally.  Gait normal with good balance and coordination.  MSK:  Non tender with full range of motion and good stability and symmetric strength and tone of shoulders, elbows, wrist, hip,  and ankles bilaterally.  Knee: Left Normal to inspection with no erythema or effusion or obvious bony abnormalities. Palpation normal with no warmth,, patellar tenderness, or condyle tenderness. Mild tenderness noted today over the medial joint line non tender on calf.  ROM full in flexion and extension and lower leg rotation. Ligaments with solid consistent endpoints including ACL, PCL, LCL, MCL. Negative Mcmurray's, Apley's, and Thessalonian tests. Non painful patellar compression. Patellar glide without crepitus. Patellar and quadriceps tendons unremarkable. Hamstring and quadriceps strength is normal.    Impression and Recommendations:     This case required medical decision making of moderate complexity.

## 2014-01-15 ENCOUNTER — Ambulatory Visit (INDEPENDENT_AMBULATORY_CARE_PROVIDER_SITE_OTHER): Payer: Medicare HMO | Admitting: Physician Assistant

## 2014-01-15 ENCOUNTER — Encounter: Payer: Self-pay | Admitting: Physician Assistant

## 2014-01-15 VITALS — BP 140/81 | HR 67 | Ht 65.5 in | Wt 153.0 lb

## 2014-01-15 DIAGNOSIS — R55 Syncope and collapse: Secondary | ICD-10-CM

## 2014-01-15 DIAGNOSIS — I10 Essential (primary) hypertension: Secondary | ICD-10-CM

## 2014-01-15 DIAGNOSIS — I447 Left bundle-branch block, unspecified: Secondary | ICD-10-CM

## 2014-01-15 NOTE — Progress Notes (Signed)
Cardiology Office Note   Date:  01/15/2014   ID:  Mandy Durham, [place] 04-10-1945, MRN [id number]  PCP:  Olga Millers, MD  Cardiologist:  Dr. Loralie Champagne     History of Present Illness: Mandy Durham is a 68 y.o. female with a hx of chronic LBBB and syncope.  LBBB has been noted since at least 2000. She had a cath in 2002 because of chest pain that showed no angiographic CAD. Echocardiogram in 1/13 showed EF 50-55% with septal-lateral dyssynchrony consistent with LBBB.   For about 3 years, she has had episodes of syncope and presyncope. She tends to get lightheaded with prolonged standing or walking at times. Occasionally, she will get severely lightheaded and pass out or nearly pass out. All episodes seem to have been while standing and have a prodrome of lightheadedness, nausea, and warmth. No chest pain or tachypalpitations. She does notice that when she drinks a lot of fluid, she does not feel as lightheaded. No exertional dyspnea. she was previously started on Florinef. She does not think this helped much. Her supine BP runs high and she had further episodes on Florinef. she was then started on pyridostigmine. This has helped considerably. At her last visit with Dr. Loralie Champagne her Florinef was DC'd.    She returns for FU.  She continues to do well on pyridostigmine. She denies any recurrent syncope or near syncope. She denies chest pain, dyspnea, orthopnea, PND or edema.   Studies:  - LHC (3/02):  Normal coronary arteries  - Echo (1/13):  Mild focal basal hypertrophy at the septum, EF 50-55%, normal wall motion, grade 1 diastolic dysfunction, mild MR   Recent Labs/Images:  07/21/2013: BUN 15; Creatinine 0.8; Hemoglobin 13.4; Potassium 3.8; Sodium 140; TSH 1.71     Wt Readings from Last 3 Encounters:  01/15/14 153 lb (69.4 kg)  01/01/14 152 lb (68.947 kg)  12/11/13 153 lb (69.4 kg)     Past Medical History: 1. Depression 2. Hyperlipidemia 3.  Osteoarthritis 4. LBBB since around 2000. LHC in 2002 showed normal coronaries.  5. Syncope: Holter 12/12 with rare PACS, HR range 64-140, average 82, no significant arrhythmias. Echo (1/13): EF 50-55%, mild LVH, septal-lateral dyssynchrony c/w LBBB. 3-week event monitor (1/13): No significant arrhythmia. EEG (11/14) normal. Suspect autonomic insufficiency.  6. H/o labyrinthitis Past Medical History  Diagnosis Date  . Insomnia   . Adenomatous polyp of colon   . Overweight(278.02)   . Symptomatic menopausal or female climacteric states   . Hyperlipidemia   . Osteoarthrosis, unspecified whether generalized or localized, unspecified site   . Depressive disorder, not elsewhere classified   . Anxiety state, unspecified   . Hypertension   . Type 2 diabetes mellitus   . Syncope and collapse     11/12: Holter 12/12 with rare PACS, HR range 64-140, average 82, no significant arrhythmias. Echo (1/13): EF 50-55%, mild LVH, septal-lateral dyssynchrony c/w LBBB. 3-week event monitor (1/13): No significant arrhythmia.   Marland Kitchen LBBB (left bundle branch block)     LHC in 2002 showed normal coronaries.     Current Outpatient Prescriptions  Medication Sig Dispense Refill  . Calcium Carbonate (CALTRATE 600 PO) Take 1,800 Units by mouth daily.     . cholecalciferol (VITAMIN D) 1000 UNITS tablet Take 1,000 Units by mouth daily.    Marland Kitchen gabapentin (NEURONTIN) 100 MG capsule Take 2 capsules (200 mg total) by mouth at bedtime. 180 capsule 0  . metoprolol succinate (TOPROL XL) 25  MG 24 hr tablet Take 1 tablet (25 mg total) by mouth daily. 90 tablet 2  . PARoxetine Mesylate (BRISDELLE) 7.5 MG CAPS Take by mouth daily.    Marland Kitchen pyridostigmine (MESTINON) 60 MG tablet Take 0.5 tablets (30 mg total) by mouth 3 (three) times daily. 135 tablet 2  . terbinafine (LAMISIL) 250 MG tablet Take 1 tablet (250 mg total) by mouth daily. 30 tablet 2  . zolpidem (AMBIEN) 5 MG tablet Take 1 tablet (5 mg total) by mouth at bedtime as  needed for sleep. 30 tablet 2  . [DISCONTINUED] Estradiol (ESTRACE PO) Take by mouth daily.      . [DISCONTINUED] medroxyPROGESTERone (PROVERA) 2.5 MG tablet Take 2.5 mg by mouth daily.       No current facility-administered medications for this visit.     Allergies:   Review of patient's allergies indicates no known allergies.   Social History:  The patient  reports that she has never smoked. She has never used smokeless tobacco. She reports that she does not drink alcohol or use illicit drugs.   Family History:  The patient's family history includes Coronary artery disease in her mother; Dementia in her mother; Diabetes in her mother; Heart failure in her mother. There is no history of Colon cancer, Breast cancer, Hypertension, or Heart disease.   ROS:  Please see the history of present illness.       All other systems reviewed and negative.    PHYSICAL EXAM: VS:  BP 140/81 mmHg  Pulse 67  Ht 5' 5.5" (1.664 m)  Wt 153 lb (69.4 kg)  BMI 25.06 kg/m2  SpO2 99% Well nourished, well developed, in no acute distress HEENT: normal Neck:  no JVD Cardiac:  normal S1, S2;  RRR; no murmur Lungs:   clear to auscultation bilaterally, no wheezing, rhonchi or rales Abd: soft, nontender, no hepatomegaly Ext:  no edema Skin: warm and dry Neuro:  CNs 2-12 intact, no focal abnormalities noted       ASSESSMENT AND PLAN:  1.  Syncope and collapse: Controlled on current regimen. 2.  LBBB (left bundle branch block):  Chronic. No ischemic symptoms. 3.  Essential hypertension, benign: Fair control. Continue to monitor.  Disposition:   FU with Dr. Aundra Dubin 6 months   Signed, Versie Starks, MHS 01/15/2014 3:33 PM    [facility] [place], [place],   [address] Phone: [phone]; Fax: [phone]

## 2014-01-15 NOTE — Patient Instructions (Signed)
Schedule a follow up with Dr. Loralie Champagne in 6 mos.  The office will mail you a card 2-3 mos before you need to schedule your appointment.

## 2014-02-02 ENCOUNTER — Ambulatory Visit (INDEPENDENT_AMBULATORY_CARE_PROVIDER_SITE_OTHER): Payer: Medicare HMO | Admitting: Family Medicine

## 2014-02-02 ENCOUNTER — Ambulatory Visit (INDEPENDENT_AMBULATORY_CARE_PROVIDER_SITE_OTHER)
Admission: RE | Admit: 2014-02-02 | Discharge: 2014-02-02 | Disposition: A | Discharge status: Home / Self Care | Payer: Medicare HMO | Source: Ambulatory Visit | Attending: Family Medicine | Admitting: Family Medicine

## 2014-02-02 ENCOUNTER — Encounter: Payer: Self-pay | Admitting: Family Medicine

## 2014-02-02 VITALS — BP 130/70 | HR 67 | Ht 65.5 in | Wt 153.0 lb

## 2014-02-02 DIAGNOSIS — S86812D Strain of other muscle(s) and tendon(s) at lower leg level, left leg, subsequent encounter: Secondary | ICD-10-CM

## 2014-02-02 DIAGNOSIS — M5416 Radiculopathy, lumbar region: Secondary | ICD-10-CM

## 2014-02-02 DIAGNOSIS — M533 Sacrococcygeal disorders, not elsewhere classified: Secondary | ICD-10-CM | POA: Insufficient documentation

## 2014-02-02 DIAGNOSIS — S86112D Strain of other muscle(s) and tendon(s) of posterior muscle group at lower leg level, left leg, subsequent encounter: Secondary | ICD-10-CM

## 2014-02-02 NOTE — Assessment & Plan Note (Signed)
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 At this point I think it is completely resolved. Encourage her though to continue the strengthening exercises 3 times a week for another 6 weeks. Patient can follow-up on an as-needed basis for this problem.

## 2014-02-02 NOTE — Assessment & Plan Note (Signed)
Discussed with patient that this is likely more secondary to some mild osteoarthritic changes and we will get x-rays to further evaluate. In addition to this I think also muscle imbalances can be contributing. Patient given specific mobilization techniques as well as strengthening exercises focusing on the hip abductors as well as stretching the hip flexors. Patient given a handout of the exercises. I did discuss with her up into gabapentin could help his pain as well and she will start taking this 100-200 mg at night. Patient will follow up and see me again in 4 weeks for further evaluation.  Spent greater than 25 minutes with patient face-to-face and had greater than 50% of counseling including as described above in assessment and plan.

## 2014-02-02 NOTE — Patient Instructions (Addendum)
Good to see you as always Happy holidays.  Tylenol 325mg  3 times a day Back exercises 3 times weekly for next 6 weeks Gabapentin 100 or 200mg  at night and stop the [place].   See me again in 6 weeks.

## 2014-02-02 NOTE — Progress Notes (Signed)
[facility] [address] [place], [place] [address] Phone: [phone] Subjective:     CC: Left leg pain follow up  [id number] Mandy Durham is a 68 y.o. female coming in with complaint of left leg pain. Patient was found to have a gastrocnemius tear as well as potentially some underlying osteophytic changes that were giving her trouble. Patient was almost nearly healed at last visit.patient also had x-rays of the left knee that were unremarkable.patient was started on gabapentin to see if this would help with some of the muscle spasms and pain she was having in the leg. We also discussed over-the-counter and vitamin supplementation. Patient state sher leg is completely resolved at this time. Patient is able to do exercises very regularly. Patient is happy with the results.patient is not taking the gabapentin Patient is complaining of some back pain. States that it is seems to be right sided. Worse when she Goes to a flexed position. Denies any radiation of the pain. Points to a local area over the right SI joint. Denies any numbness. Remember any true injury.     Past medical history, social, surgical and family history all reviewed in electronic medical record.   Review of Systems: No headache, visual changes, nausea, vomiting, diarrhea, constipation, dizziness, abdominal pain, skin rash, fevers, chills, night sweats, weight loss, swollen lymph nodes, body aches, joint swelling, muscle aches, chest pain, shortness of breath, mood changes.   Objective Blood pressure 130/70, pulse 67, height 5' 5.5" (1.664 m), weight 153 lb (69.4 kg).  General: No apparent distress alert and oriented x3 mood and affect normal, dressed appropriately.  HEENT: Pupils equal, extraocular movements intact  Respiratory: Patient's speak in full sentences and does not appear short of breath  Cardiovascular: No lower extremity edema, non tender, no erythema  Skin: Warm dry intact with  no signs of infection or rash on extremities or on axial skeleton.  Abdomen: Soft nontender  Neuro: Cranial nerves II through XII are intact, neurovascularly intact in all extremities with 2+ DTRs and 2+ pulses.  Lymph: No lymphadenopathy of posterior or anterior cervical chain or axillae bilaterally.  Gait normal with good balance and coordination.  MSK:  Non tender with full range of motion and good stability and symmetric strength and tone of shoulders, elbows, wrist, hip,  and ankles bilaterally.  Knee: Left Normal to inspection with no erythema or effusion or obvious bony abnormalities. Palpation normal with no warmth,, patellar tenderness, or condyle tenderness.  ROM full in flexion and extension and lower leg rotation. Ligaments with solid consistent endpoints including ACL, PCL, LCL, MCL. Negative Mcmurray's, Apley's, and Thessalonian tests. Non painful patellar compression. Patellar glide without crepitus. Patellar and quadriceps tendons unremarkable. Hamstring and quadriceps strength is normal.  Contralateral knee unremarkable  Back Exam:  Inspection: Unremarkable  Motion: Flexion 45 deg, Extension 45 deg, Side Bending to 45 deg bilaterally,  Rotation to 45 deg bilaterally  SLR laying: Negative  XSLR laying: Negative  Palpable tenderness: tender over right SI joint FABER: negative. Sensory change: Gross sensation intact to all lumbar and sacral dermatomes.  Reflexes: 2+ at both patellar tendons, 2+ at achilles tendons, Babinski's downgoing.  Strength at foot  Plantar-flexion: 5/5 Dorsi-flexion: 5/5 Eversion: 5/5 Inversion: 5/5  Leg strength  Quad: 5/5 Hamstring: 5/5 Hip flexor: 5/5 Hip abductors: 4/5  Gait unremarkable.    Impression and Recommendations:     This case required medical decision making of moderate complexity.

## 2014-02-16 ENCOUNTER — Ambulatory Visit: Payer: Medicare HMO | Admitting: Internal Medicine

## 2014-02-19 ENCOUNTER — Ambulatory Visit: Payer: Medicare HMO | Admitting: Internal Medicine

## 2014-02-22 ENCOUNTER — Encounter: Payer: Self-pay | Admitting: Internal Medicine

## 2014-02-22 MED ORDER — TERBINAFINE HCL 250 MG PO TABS: 250.0000 mg | ORAL_TABLET | Freq: Every day | ORAL | Status: DC

## 2014-02-26 ENCOUNTER — Ambulatory Visit (INDEPENDENT_AMBULATORY_CARE_PROVIDER_SITE_OTHER): Payer: Medicare HMO | Admitting: Internal Medicine

## 2014-02-26 ENCOUNTER — Encounter: Payer: Self-pay | Admitting: Internal Medicine

## 2014-02-26 VITALS — BP 112/72 | HR 67 | Temp 97.9°F | Resp 14 | Ht 65.0 in | Wt 154.8 lb

## 2014-02-26 DIAGNOSIS — B351 Tinea unguium: Secondary | ICD-10-CM

## 2014-02-26 DIAGNOSIS — J3089 Other allergic rhinitis: Secondary | ICD-10-CM

## 2014-02-26 MED ORDER — FLUTICASONE PROPIONATE 50 MCG/ACT NA SUSP: 2.0000 | Freq: Every day | NASAL | Status: DC

## 2014-02-26 NOTE — Patient Instructions (Signed)
We will send in a medicine called Flonase for your nose. It should help to stop the nose from running. Take 2 puffs in each nostril once a day. It may take about a week before you notice the full benefits.  Keep taking the Lamisil for the next 3 months. At that time it will be safe to stop taking the medicine.

## 2014-02-26 NOTE — Progress Notes (Signed)
Pre visit review using our clinic review tool, if applicable. No additional management support is needed unless otherwise documented below in the visit note. 

## 2014-02-27 DIAGNOSIS — J309 Allergic rhinitis, unspecified: Secondary | ICD-10-CM | POA: Insufficient documentation

## 2014-02-27 NOTE — Assessment & Plan Note (Signed)
Rx for Flonase. 

## 2014-02-27 NOTE — Progress Notes (Signed)
   Subjective:    Patient ID: Mandy Durham, female    DOB: 12-11-1945, 68 y.o.   MRN: [id number]  HPI The patient is a 68 year old woman who comes in today to follow-up on her toenail fungus. She has been taking the Lamisil last 3 months and feels that things are markedly improved. Unfortunately she does have toenail polish on her toes today and is unable to show me the progress. Her only other new complaint is some drooping of her nose which happens on a daily basis. She denies sore throat, cough, fevers.  Review of Systems  Constitutional: Negative for fever, chills, activity change, appetite change, fatigue and unexpected weight change.  HENT: Positive for rhinorrhea.   Respiratory: Negative for cough, chest tightness, shortness of breath and wheezing.   Cardiovascular: Negative for chest pain, palpitations and leg swelling.  Gastrointestinal: Negative for nausea, vomiting, abdominal pain, diarrhea and constipation.  Musculoskeletal: Negative for myalgias and gait problem.  Neurological: Negative for dizziness, tremors, syncope, weakness, light-headedness and headaches.  Psychiatric/Behavioral: Negative for suicidal ideas, confusion, self-injury, decreased concentration and agitation. The patient is not nervous/anxious.       Objective:   Physical Exam  Constitutional: She is oriented to person, place, and time. She appears well-developed and well-nourished. No distress.  HENT:  Head: Normocephalic and atraumatic.  Nose: Nose normal.  Mouth/Throat: Oropharynx is clear and moist.  Eyes: EOM are normal. Pupils are equal, round, and reactive to light.  Neck: Normal range of motion. Neck supple.  Cardiovascular: Normal rate.   Murmur heard. Pulmonary/Chest: Effort normal and breath sounds normal. No respiratory distress. She has no wheezes. She has no rales. She exhibits no tenderness.  Abdominal: Soft. Bowel sounds are normal. She exhibits no distension. There is no tenderness. There is  no rebound and no guarding.  Musculoskeletal: Normal range of motion. She exhibits no edema or tenderness.  Neurological: She is alert and oriented to person, place, and time. No cranial nerve deficit.  Skin: Skin is warm and dry. She is not diaphoretic.   Filed Vitals:   02/26/14 1533  BP: 112/72  Pulse: 67  Temp: 97.9 F (36.6 C)  TempSrc: Oral  Resp: 14  Height: 5\' 5"  (1.651 m)  Weight: 154 lb 12.8 oz (70.217 kg)  SpO2: 97%      Assessment & Plan:

## 2014-02-27 NOTE — Assessment & Plan Note (Signed)
Spoke with patient about prevention strategy for recurrence of toenail fungus. She had 30 refilled her medication for another 3 months, not harmful to continue. Advised her to stop when that is done.

## 2014-03-21 ENCOUNTER — Encounter: Payer: Self-pay | Admitting: Family Medicine

## 2014-03-21 ENCOUNTER — Ambulatory Visit (INDEPENDENT_AMBULATORY_CARE_PROVIDER_SITE_OTHER): Payer: Medicare HMO | Admitting: Family Medicine

## 2014-03-21 VITALS — BP 120/82 | HR 71 | Ht 65.0 in | Wt 156.0 lb

## 2014-03-21 DIAGNOSIS — M19012 Primary osteoarthritis, left shoulder: Secondary | ICD-10-CM

## 2014-03-21 DIAGNOSIS — M533 Sacrococcygeal disorders, not elsewhere classified: Secondary | ICD-10-CM

## 2014-03-21 DIAGNOSIS — M19019 Primary osteoarthritis, unspecified shoulder: Secondary | ICD-10-CM | POA: Insufficient documentation

## 2014-03-21 NOTE — Progress Notes (Signed)
  [name] [address], [place] [address] Phone: [phone] Subjective:     CC: back pain follow up  [id number] Mandy Durham is a 69 y.o. female coming in for follow-up of her low back pain. Patient was seen previously and was diagnosed with a degenerative scoliosis L4-5 being most concern.  Patient was given home exercises and states that she is doing completely better with no pain whatsoever. She does spinning class 4 times a week without any pain.  Patient does have a new problem. Patient is having some left shoulder pain. States that it is intermittent. Worse actually at night when she is laying on the shoulder. States that it is not all movements just some movements. Worse when she is lifting something heavy. Seems to be fairly localized. Rates the severity of 4 out of 10. Not stopping her from any activities. Does remember an injury when she was a child.      Past medical history, social, surgical and family history all reviewed in electronic medical record.   Review of Systems: No headache, visual changes, nausea, vomiting, diarrhea, constipation, dizziness, abdominal pain, skin rash, fevers, chills, night sweats, weight loss, swollen lymph nodes, body aches, joint swelling, muscle aches, chest pain, shortness of breath, mood changes.   Objective Blood pressure 120/82, pulse 71, height 5\' 5"  (1.651 m), weight 156 lb (70.761 kg), SpO2 95 %.  General: No apparent distress alert and oriented x3 mood and affect normal, dressed appropriately.  HEENT: Pupils equal, extraocular movements intact  Respiratory: Patient's speak in full sentences and does not appear short of breath  Cardiovascular: No lower extremity edema, non tender, no erythema  Skin: Warm dry intact with no signs of infection or rash on extremities or on axial skeleton.  Abdomen: Soft nontender  Neuro: Cranial nerves II through XII are intact, neurovascularly intact in all  extremities with 2+ DTRs and 2+ pulses.  Lymph: No lymphadenopathy of posterior or anterior cervical chain or axillae bilaterally.  Gait normal with good balance and coordination.  MSK:  Non tender with full range of motion and good stability and symmetric strength and tone of  elbows, wrist, hip,  and ankles bilaterally.   Shoulder: Inspection reveals no abnormalities, atrophy or asymmetry. Palpation reveals severe tenderness over the before meals joint ROM is full in all planes. Rotator cuff strength normal throughout. No signs of impingement with negative Neer and Hawkin's tests, empty can sign. Speeds and Yergason's tests normal. Positive crossover sign Normal scapular function observed. No painful arc and no drop arm sign. No apprehension sign   Limited ultrasound the patient shoulder shows significant osteoarthritis of the left acromioclavicular joint.   Back Exam:  Inspection: Unremarkable  Motion: Flexion 45 deg, Extension 45 deg, Side Bending to 45 deg bilaterally,  Rotation to 45 deg bilaterally  SLR laying: Negative  XSLR laying: Negative  Palpable tenderness: Nontender on exam FABER: negative. Sensory change: Gross sensation intact to all lumbar and sacral dermatomes.  Reflexes: 2+ at both patellar tendons, 2+ at achilles tendons, Babinski's downgoing.  Strength at foot  Plantar-flexion: 5/5 Dorsi-flexion: 5/5 Eversion: 5/5 Inversion: 5/5  Leg strength  Quad: 5/5 Hamstring: 5/5 Hip flexor: 5/5 Hip abductors: 4/5  Gait unremarkable. Improved from previous visit   Impression and Recommendations:     This case required medical decision making of moderate complexity.

## 2014-03-21 NOTE — Assessment & Plan Note (Signed)
Patient does have chronic degenerative changes of the before meals joint on the left side. I do not feel that further imaging is necessary at this time. Patient will do and icing and topical anti-inflammatories. Patient was given a trial size. Patient will try these different changes if any worsening we'll consider an ultrasound-guided intra-articular injection of the acromioclavicular joint. His lungs patient continues to improve she can follow-up as needed.

## 2014-03-21 NOTE — Patient Instructions (Addendum)
So good to see you AC joint arthritis is what is going on with the shoulder.  I am so glad your leg is better.  Keep working out.  Try the cream nightly to help with the pain.  Put only a pinky amount on  Ice is your friend 10 minutes at night See me again when you need me.

## 2014-03-21 NOTE — Assessment & Plan Note (Signed)
Seems to have resolved at this time. Discussed icing and home exercises. Patient is no longer taking the gabapentin. Patient can follow-up as needed.

## 2014-04-07 ENCOUNTER — Encounter: Payer: Self-pay | Admitting: Internal Medicine

## 2014-04-30 ENCOUNTER — Ambulatory Visit: Payer: Medicare HMO | Admitting: Internal Medicine

## 2014-05-01 ENCOUNTER — Other Ambulatory Visit: Payer: Self-pay | Admitting: Internal Medicine

## 2014-05-21 ENCOUNTER — Other Ambulatory Visit: Payer: Self-pay | Admitting: Cardiology

## 2014-05-28 ENCOUNTER — Telehealth: Payer: Self-pay

## 2014-05-28 NOTE — Telephone Encounter (Signed)
See speciality notes 

## 2014-05-29 ENCOUNTER — Ambulatory Visit (INDEPENDENT_AMBULATORY_CARE_PROVIDER_SITE_OTHER): Payer: Medicare HMO | Admitting: Internal Medicine

## 2014-05-29 ENCOUNTER — Encounter: Payer: Self-pay | Admitting: Internal Medicine

## 2014-05-29 VITALS — BP 124/78 | HR 66 | Temp 98.2°F | Ht 65.0 in | Wt 152.5 lb

## 2014-05-29 DIAGNOSIS — E119 Type 2 diabetes mellitus without complications: Secondary | ICD-10-CM

## 2014-05-29 DIAGNOSIS — M1990 Unspecified osteoarthritis, unspecified site: Secondary | ICD-10-CM

## 2014-05-29 DIAGNOSIS — B351 Tinea unguium: Secondary | ICD-10-CM

## 2014-05-29 DIAGNOSIS — I10 Essential (primary) hypertension: Secondary | ICD-10-CM

## 2014-05-29 MED ORDER — AZELASTINE HCL 0.1 % NA SOLN: 2.0000 | Freq: Every evening | NASAL | Status: DC | PRN

## 2014-05-29 NOTE — Progress Notes (Signed)
Pre visit review using our clinic review tool, if applicable. No additional management support is needed unless otherwise documented below in the visit note. 

## 2014-05-29 NOTE — Progress Notes (Signed)
Subjective:    Patient ID: Mandy Durham, female    DOB: 04/22/1945, 69 y.o.   MRN: [id number]  DOS:  05/29/2014 Type of visit - description : new pt, transferring from Dr. Crissie Sickles, chart extensively reviewed Interval history: Hypertension, on metoprolol, good compliance Hot flashes, on a low dose of SSRIs, it does help to some extent. History of allergies, on Flonase, symptoms not well-controlled. Onychomycosis, on Lamisil since November, will finish treatment in the next few days. History of syncope, no recent problems. DJD, follow-up by Dr. Tamala Julian.    Review of Systems Denies difficulty breathing, for the last several months has occasionally sharp chest pain, at the upper chest, last 5 or 6 seconds, not associated with any other symptoms. No nausea, vomiting, diarrhea or blood in the stools No cough or sputum production No anxiety or depression  Past Medical History  Diagnosis Date  . Insomnia   . Adenomatous polyp of colon   . Overweight(278.02)   . Symptomatic menopausal or female climacteric states   . Hyperlipidemia   . Osteoarthrosis, unspecified whether generalized or localized, unspecified site   . Depressive disorder, not elsewhere classified   . Anxiety state, unspecified   . Hypertension   . Type 2 diabetes mellitus   . Syncope and collapse     11/12: Holter 12/12 with rare PACS, HR range 64-140, average 82, no significant arrhythmias. Echo (1/13): EF 50-55%, mild LVH, septal-lateral dyssynchrony c/w LBBB. 3-week event monitor (1/13): No significant arrhythmia.   Marland Kitchen LBBB (left bundle branch block)     LHC in 2002 showed normal coronaries.     Past Surgical History  Procedure Laterality Date  . Tubal ligation      History   Social History  . Marital Status: Married    Spouse Name: N/A  . Number of Children: N/A  . Years of Education: N/A   Occupational History  . 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 Smoking  status: Never Smoker   . Smokeless tobacco: Never Used  . Alcohol Use: No  . Drug Use: No  . Sexual Activity: Not on file   Other Topics Concern  . Not on file   Social History Narrative   HSG.  Married '65.  2 daughters, '71, '77; 4 grandchildren                 Medication List       This list is accurate as of: 05/29/14  4:17 PM.  Always use your most recent med list.               BRISDELLE 7.5 MG Caps  Generic drug:  PARoxetine Mesylate  Take by mouth daily.     cholecalciferol 1000 UNITS tablet  Commonly known as:  VITAMIN D  Take 2,000 Units by mouth daily.     fluticasone 50 MCG/ACT nasal spray  Commonly known as:  FLONASE  Place 2 sprays into both nostrils daily.     metoprolol succinate 25 MG 24 hr tablet  Commonly known as:  TOPROL-XL  TAKE ONE TABLET BY MOUTH ONCE DAILY     pyridostigmine 60 MG tablet  Commonly known as:  MESTINON  Take 0.5 tablets (30 mg total) by mouth 3 (three) times daily.     terbinafine 250 MG tablet  Commonly known as:  LAMISIL  TAKE ONE TABLET BY MOUTH ONCE DAILY     zolpidem 5 MG tablet  Commonly known  as:  AMBIEN  Take 1 tablet (5 mg total) by mouth at bedtime as needed for sleep.           Objective:   Physical Exam BP 124/78 mmHg  Pulse 66  Temp(Src) 98.2 F (36.8 C) (Oral)  Ht 5\' 5"  (1.651 m)  Wt 152 lb 8 oz (69.174 kg)  BMI 25.38 kg/m2  SpO2 97% General:   Well developed, well nourished . NAD.  HEENT:  Normocephalic . Face symmetric, atraumatic Neck: No thyromegaly Lungs:  CTA B Normal respiratory effort, no intercostal retractions, no accessory muscle use. Heart: RRR,  no murmur.  Abdomen:  Not distended, soft, non-tender. No rebound or rigidity. No mass,organomegaly Muscle skeletal: Trace pretibial edema bilaterally  Skin: Not pale. Not jaundice Neurologic:  alert & oriented X3.  Speech normal, gait appropriate for age and unassisted Psych--  Cognition and judgment appear intact.    Cooperative with normal attention span and concentration.  Behavior appropriate. No anxious or depressed appearing.       Assessment & Plan:    Prediabetes, will refer to a nutritionist to help her with diet, she is concerned about her abdominal fat. Check A1c  History of syncope, at some point was taking Florinef, currently on Mestinon, symptoms felt to be due to orthostatic hypotension  DJD, follow-up by Dr Tamala Julian  Onychomycosis, finish as 3 morning round of Lamisil, reports that her nails look better, recommend not to go back on Lamisil 2-3 months  Allergies, not well controlled with Flonase, will add Astelin  Hypertension, on Toprol, check a BMP  Follow-up 4 months fasting for a physical exam Wonders about how to take calcium and vitamin D: Recommend calcium and vitamin D daily around 1000 mg and 1000 units. Last bone density test was 2012, will order a bone density test when she comes back

## 2014-05-29 NOTE — Patient Instructions (Signed)
Get your blood work before you leave    Take calcium 1000 mg a day Take vitamin D 1000 units daily  Continue Flonase Add Astelin 2 sprays in each side of the nose every night.    Come back to the office in 4 months  for a physical exam  Please schedule an appointment at the front desk    Come back fasting

## 2014-05-30 LAB — BASIC METABOLIC PANEL
BUN: 14 mg/dL (ref 6–23)
CHLORIDE: 104 meq/L (ref 96–112)
CO2: 31 meq/L (ref 19–32)
Calcium: 9 mg/dL (ref 8.4–10.5)
Creatinine, Ser: 0.75 mg/dL (ref 0.40–1.20)
GFR: 81.27 mL/min (ref >60.00)
Glucose, Bld: 90 mg/dL (ref 70–99)
Potassium: 4.1 mEq/L (ref 3.5–5.1)
Sodium: 138 mEq/L (ref 135–145)

## 2014-05-30 LAB — AST: AST: 20 U/L (ref 0–37)

## 2014-05-30 LAB — HEMOGLOBIN A1C: Hgb A1c MFr Bld: 6.4 % (ref 4.6–6.5)

## 2014-05-30 LAB — ALT: ALT: 16 U/L (ref 0–35)

## 2014-06-11 ENCOUNTER — Other Ambulatory Visit: Payer: Self-pay | Admitting: Internal Medicine

## 2014-06-11 DIAGNOSIS — Z1231 Encounter for screening mammogram for malignant neoplasm of breast: Secondary | ICD-10-CM

## 2014-06-14 ENCOUNTER — Encounter: Payer: Medicare HMO | Attending: Internal Medicine

## 2014-06-14 VITALS — Ht 65.5 in | Wt 151.9 lb

## 2014-06-14 DIAGNOSIS — E119 Type 2 diabetes mellitus without complications: Secondary | ICD-10-CM | POA: Diagnosis present

## 2014-06-14 DIAGNOSIS — Z713 Dietary counseling and surveillance: Secondary | ICD-10-CM | POA: Insufficient documentation

## 2014-06-19 NOTE — Progress Notes (Signed)

## 2014-06-21 ENCOUNTER — Encounter: Payer: Medicare HMO | Attending: Internal Medicine

## 2014-06-21 DIAGNOSIS — Z713 Dietary counseling and surveillance: Secondary | ICD-10-CM | POA: Insufficient documentation

## 2014-06-21 DIAGNOSIS — E119 Type 2 diabetes mellitus without complications: Secondary | ICD-10-CM | POA: Diagnosis present

## 2014-06-21 NOTE — Progress Notes (Signed)

## 2014-06-26 ENCOUNTER — Other Ambulatory Visit: Payer: Self-pay | Admitting: Obstetrics and Gynecology

## 2014-06-27 ENCOUNTER — Telehealth: Payer: Self-pay

## 2014-06-27 LAB — CYTOLOGY - PAP

## 2014-06-27 NOTE — Telephone Encounter (Signed)
Call to introduce [facility] visit and patient stated she is switched care to other practice.

## 2014-06-28 DIAGNOSIS — E119 Type 2 diabetes mellitus without complications: Secondary | ICD-10-CM

## 2014-07-03 NOTE — Progress Notes (Signed)
Patient was seen on 06/28/14 for the third of a series of three diabetes self-management courses at the [facility].   Catalina Gravel the amount of activity recommended for healthy living . Describe activities suitable for individual needs . Identify ways to regularly incorporate activity into daily life . Identify barriers to activity and ways to over come these barriers  Identify diabetes medications being personally used and their primary action for lowering glucose and possible side effects . Describe role of stress on blood glucose and develop strategies to address psychosocial issues . Identify diabetes complications and ways to prevent them  Explain how to manage diabetes during illness . Evaluate success in meeting personal goal . Establish 2-3 goals that they will plan to diligently work on until they return for the  78-month follow-up visit  Goals:   I will count my carb choices at most meals and snacks  I will be active 215 minutes or more 3 times a week  I will eat less unhealthy fats by eating less fat  To help manage stress I will  Exercise  at least 3 times a week  Your patient has identified these potential barriers to change:  Motivation  Your patient has identified their diabetes self-care support plan as  [place] [facility] On-line Resources Plan:  Attend Core 4 in 4 months

## 2014-07-05 ENCOUNTER — Encounter: Payer: Self-pay | Admitting: Cardiology

## 2014-07-05 ENCOUNTER — Other Ambulatory Visit: Payer: Self-pay | Admitting: Cardiology

## 2014-07-11 ENCOUNTER — Encounter: Payer: Self-pay | Admitting: Internal Medicine

## 2014-07-12 ENCOUNTER — Telehealth: Payer: Self-pay

## 2014-07-12 MED ORDER — ZOLPIDEM TARTRATE 5 MG PO TABS: 5.0000 mg | ORAL_TABLET | Freq: Every evening | ORAL | Status: DC | PRN

## 2014-07-12 NOTE — Telephone Encounter (Signed)
Encounter closed by mistake, please see below.  

## 2014-07-12 NOTE — Telephone Encounter (Signed)
Ok 30 and 2 RF 

## 2014-07-12 NOTE — Telephone Encounter (Signed)
Pt is requesting a refill on Ambien.  Last OV: 05/28/2013, new patient Last Filled by Dr. Doug Sou on 12/06/2013 # 30 2RF  Please advise.

## 2014-07-12 NOTE — Addendum Note (Signed)
Addended by: Wilfrid Lund on: 07/12/2014 11:26 AM   Modules accepted: Orders

## 2014-07-12 NOTE — Telephone Encounter (Signed)
Rx printed, awaiting MD signature.  

## 2014-07-12 NOTE — Telephone Encounter (Signed)
Rx faxed to [facility].

## 2014-07-24 ENCOUNTER — Ambulatory Visit (HOSPITAL_COMMUNITY): Payer: Medicare HMO

## 2014-08-07 ENCOUNTER — Ambulatory Visit (HOSPITAL_COMMUNITY)
Admission: RE | Admit: 2014-08-07 | Discharge: 2014-08-07 | Disposition: A | Discharge status: Home / Self Care | Payer: Medicare HMO | Source: Ambulatory Visit | Attending: Internal Medicine | Admitting: Internal Medicine

## 2014-08-07 DIAGNOSIS — Z1231 Encounter for screening mammogram for malignant neoplasm of breast: Secondary | ICD-10-CM | POA: Diagnosis present

## 2014-08-20 ENCOUNTER — Other Ambulatory Visit: Payer: Self-pay | Admitting: Cardiology

## 2014-08-20 ENCOUNTER — Telehealth: Payer: Self-pay | Admitting: Internal Medicine

## 2014-08-20 NOTE — Telephone Encounter (Signed)
Pre Visit letter sent  °

## 2014-08-30 ENCOUNTER — Encounter: Payer: Self-pay | Admitting: Cardiology

## 2014-08-30 ENCOUNTER — Ambulatory Visit (INDEPENDENT_AMBULATORY_CARE_PROVIDER_SITE_OTHER): Payer: Medicare HMO | Admitting: Cardiology

## 2014-08-30 VITALS — BP 152/68 | HR 78 | Ht 65.5 in | Wt 151.4 lb

## 2014-08-30 DIAGNOSIS — I10 Essential (primary) hypertension: Secondary | ICD-10-CM

## 2014-08-30 DIAGNOSIS — I951 Orthostatic hypotension: Secondary | ICD-10-CM

## 2014-08-30 DIAGNOSIS — R011 Cardiac murmur, unspecified: Secondary | ICD-10-CM | POA: Diagnosis not present

## 2014-08-30 MED ORDER — PYRIDOSTIGMINE BROMIDE 60 MG PO TABS: 60.0000 mg | ORAL_TABLET | Freq: Three times a day (TID) | ORAL | Status: DC

## 2014-08-30 MED ORDER — METOPROLOL TARTRATE 25 MG PO TABS: 25.0000 mg | ORAL_TABLET | Freq: Two times a day (BID) | ORAL | Status: DC

## 2014-08-30 NOTE — Progress Notes (Signed)
Patient ID: Mandy Durham, female   DOB: 26-Sep-1945, 69 y.o.   MRN: [id number] PCP: Dr. Linna Darner  69 yo with history of chronic LBBB and syncope returns for cardiology followup. LBBB has been noted since at least 2000.  She had a cath in 2002 because of chest pain that showed no angiographic CAD.  I had her do an echocardiogram in 1/13 showed EF 50-55% with septal-lateral dyssynchrony consistent with LBBB.   For several years, she has had episodes of syncope and presyncope.  She tends to get lightheaded with prolonged standing or walking at times.  Occasionally, she would get severely lightheaded and pass out or nearly pass out.  All episodes seem to have been while standing and have a prodrome of lightheadedness, nausea, and warmth.  No chest pain or tachypalpitations.  She does notice that when she drinks a lot of fluid, she does not feel as lightheaded.  No exertional dyspnea.  At a prior appointment, I started her on Florinef.  She does not think this helped much.  Her supine BP runs high and she had further episodes on Florinef.  Therefore, at a prior appointment, I had her start pyridostigmine.  This helped considerably initially.  No further syncope or presyncope.  However, recently, she has begun to notice some lightheadedness with standing again. No new medications. Good exercise tolerance, she walks and does spin class for exercise.   Labs (4/12): LDL 80, HDL 123, TSH normal Labs (11/12): K 4, creatinine 0.8 Labs (8/14): LDL 107, HDL 102 Labs (11/14): K 3.4, creatinine 0.7 Labs (5/15): K 3.8, creatinine 0.8, TSH normal Labs (3/16); K 4.1, creatinine 0.75  PMH: 1. Depression 2. Hyperlipidemia 3. Osteoarthritis 4. LBBB since around 2000.  LHC in 2002 showed normal coronaries.   5. Syncope: Holter 12/12 with rare PACS, HR range 64-140, average 82, no significant arrhythmias.  Echo (1/13): EF 50-55%, mild LVH, septal-lateral dyssynchrony c/w LBBB.  3-week event monitor (1/13): No significant  arrhythmia.  EEG (11/14) normal.  Suspect autonomic insufficiency.  6. H/o labyrinthitis  SH: Lives in [place], married, nonsmoker, works as an Biochemist, clinical.   FH: No CAD that she knows of.  No CHF.   ROS: All systems reviewed and negative except as per HPI.    Current Outpatient Prescriptions  Medication Sig Dispense Refill  . calcium carbonate (OS-CAL) 600 MG TABS tablet Take 1,200 mg by mouth daily with breakfast.    . cholecalciferol (VITAMIN D) 1000 UNITS tablet Take 2,000 Units by mouth daily.     Marland Kitchen PARoxetine Mesylate (BRISDELLE) 7.5 MG CAPS Take 1 capsule by mouth daily.     Marland Kitchen pyridostigmine (MESTINON) 60 MG tablet Take 1 tablet (60 mg total) by mouth 3 (three) times daily. 135 tablet 3  . terbinafine (LAMISIL) 250 MG tablet TAKE ONE TABLET BY MOUTH ONCE DAILY 30 tablet 0  . zolpidem (AMBIEN) 5 MG tablet Take 1 tablet (5 mg total) by mouth at bedtime as needed for sleep. 30 tablet 2  . metoprolol tartrate (LOPRESSOR) 25 MG tablet Take 1 tablet (25 mg total) by mouth 2 (two) times daily. 180 tablet 3  . [DISCONTINUED] Estradiol (ESTRACE PO) Take by mouth daily.      . [DISCONTINUED] medroxyPROGESTERone (PROVERA) 2.5 MG tablet Take 2.5 mg by mouth daily.       No current facility-administered medications for this visit.    BP 152/68 mmHg  Pulse 78  Ht 5' 5.5" (1.664 m)  Wt 151 lb 6.4 oz (  68.675 kg)  BMI 24.80 kg/m2  SpO2 98% General: NAD Neck: No JVD, no thyromegaly or thyroid nodule.  Lungs: Clear to auscultation bilaterally with normal respiratory effort. CV: Nondisplaced PMI.  Heart regular S1/S2, no S3/S4, no murmur.  No peripheral edema.  No carotid bruit.  Normal pedal pulses.  Abdomen: Soft, nontender, no hepatosplenomegaly, no distention.  Neurologic: Alert and oriented x 3.  Psych: Normal affect. Extremities: No clubbing or cyanosis.   Assessment/Plan: 1. Lightheadedness: Always when standing, brief in duration.  I suspect that these spells represent  autonomic insufficiency/orthostatic hypotension.  Past event monitoring has shown no arrhythmia.  Florinef did not help much and BP is elevated.  Pyridostigmine has worked well, but recently has seemed less effective.   - Increase pyridostigmine to 60 mg tid.  2. LBBB: Chronic.  No ischemic symptoms.  3. Supine HTN with orthostatic hypotension: She has been on Toprol XL 25 mg daily.  This is getting to be too expensive.  I will have her stop Toprol XL and start metoprolol 25 mg bid.   Followup in 2 months to make sure increase in pyridostigmine has helped.   Loralie Champagne 08/30/2014

## 2014-08-30 NOTE — Patient Instructions (Signed)
Medication Instructions:  Your physician has recommended you make the following change in your medication:  1) STOP Torpolol XL 2) START Lopressor 25 mg tablet by mouth TWICE  daily   Labwork: NONE  Testing/Procedures: Your physician has requested that you have an echocardiogram. Echocardiography is a painless test that uses sound waves to create images of your heart. It provides your doctor with information about the size and shape of your heart and how well your heart's chambers and valves are working. This procedure takes approximately one hour. There are no restrictions for this procedure.    Follow-Up: Your physician recommends that you schedule a follow-up appointment in: 2 months with Richardson Dopp, [place] wants you to follow-up in: 12 months with Dr. Aundra Dubin. You will receive a reminder letter in the mail two months in advance. If you don't receive a letter, please call our office to schedule the follow-up appointment.   Any Other Special Instructions Will Be Listed Below (If Applicable).

## 2014-09-04 ENCOUNTER — Other Ambulatory Visit: Payer: Self-pay

## 2014-09-04 ENCOUNTER — Ambulatory Visit (HOSPITAL_COMMUNITY): Payer: Medicare HMO | Attending: Cardiology

## 2014-09-04 DIAGNOSIS — R011 Cardiac murmur, unspecified: Secondary | ICD-10-CM | POA: Insufficient documentation

## 2014-09-06 ENCOUNTER — Telehealth: Payer: Self-pay | Admitting: *Deleted

## 2014-09-06 ENCOUNTER — Telehealth: Payer: Self-pay | Admitting: Behavioral Health

## 2014-09-06 NOTE — Telephone Encounter (Signed)
States that she does have question regarding her exercise routine. She does Spin classes three times per week and states that she is exercising regularly but still feels "tired and fatigued". Discussed Target heart rate levels during exercise. Patient states her heart rate during exercise usually gets up to 140 or 150. Patient will try to keep heart rate closer to 125 and see if that helps with her level of exercise fatigue. Will forward to Dr. Aundra Dubin for his advisement.

## 2014-09-06 NOTE — Telephone Encounter (Signed)
Unable to reach patient at time of Pre-Visit Call.  Patient did not have a voice mailbox to leave a message.

## 2014-09-07 ENCOUNTER — Ambulatory Visit (INDEPENDENT_AMBULATORY_CARE_PROVIDER_SITE_OTHER): Payer: Medicare HMO | Admitting: Internal Medicine

## 2014-09-07 ENCOUNTER — Encounter: Payer: Self-pay | Admitting: Internal Medicine

## 2014-09-07 VITALS — BP 124/76 | HR 57 | Temp 97.7°F | Ht 66.0 in | Wt 153.0 lb

## 2014-09-07 DIAGNOSIS — R7303 Prediabetes: Secondary | ICD-10-CM

## 2014-09-07 DIAGNOSIS — I10 Essential (primary) hypertension: Secondary | ICD-10-CM | POA: Diagnosis not present

## 2014-09-07 DIAGNOSIS — R5382 Chronic fatigue, unspecified: Secondary | ICD-10-CM | POA: Diagnosis not present

## 2014-09-07 DIAGNOSIS — R7309 Other abnormal glucose: Secondary | ICD-10-CM | POA: Diagnosis not present

## 2014-09-07 DIAGNOSIS — Z0000 Encounter for general adult medical examination without abnormal findings: Secondary | ICD-10-CM | POA: Diagnosis not present

## 2014-09-07 DIAGNOSIS — Z23 Encounter for immunization: Secondary | ICD-10-CM | POA: Diagnosis not present

## 2014-09-07 MED ORDER — ALPRAZOLAM 0.25 MG PO TABS: 0.2500 mg | ORAL_TABLET | Freq: Every evening | ORAL | Status: DC | PRN

## 2014-09-07 MED ORDER — HYDROCORTISONE 2.5 % EX CREA: TOPICAL_CREAM | Freq: Two times a day (BID) | CUTANEOUS | Status: DC

## 2014-09-07 NOTE — Patient Instructions (Addendum)
Please schedule labs to be done within few days (fasting)   Using the cream twice a day for 10 days, call if not better   Stop Ambien, use alprazolam one or 2 tablets at bedtime to help sleep.    Fall Prevention and Home Safety Falls cause injuries and can affect all age groups. It is possible to use preventive measures to significantly decrease the likelihood of falls. There are many simple measures which can make your home safer and prevent falls. OUTDOORS  Repair cracks and edges of walkways and driveways.  Remove high doorway thresholds.  Trim shrubbery on the main path into your home.  Have good outside lighting.  Clear walkways of tools, rocks, debris, and clutter.  Check that handrails are not broken and are securely fastened. Both sides of steps should have handrails.  Have leaves, snow, and ice cleared regularly.  Use sand or salt on walkways during winter months.  In the garage, clean up grease or oil spills. BATHROOM  Install night lights.  Install grab bars by the toilet and in the tub and shower.  Use non-skid mats or decals in the tub or shower.  Place a plastic non-slip stool in the shower to sit on, if needed.  Keep floors dry and clean up all water on the floor immediately.  Remove soap buildup in the tub or shower on a regular basis.  Secure bath mats with non-slip, double-sided rug tape.  Remove throw rugs and tripping hazards from the floors. BEDROOMS  Install night lights.  Make sure a bedside light is easy to reach.  Do not use oversized bedding.  Keep a telephone by your bedside.  Have a firm chair with side arms to use for getting dressed.  Remove throw rugs and tripping hazards from the floor. KITCHEN  Keep handles on pots and pans turned toward the center of the stove. Use back burners when possible.  Clean up spills quickly and allow time for drying.  Avoid walking on wet floors.  Avoid hot utensils and  knives.  Position shelves so they are not too high or low.  Place commonly used objects within easy reach.  If necessary, use a sturdy step stool with a grab bar when reaching.  Keep electrical cables out of the way.  Do not use floor polish or wax that makes floors slippery. If you must use wax, use non-skid floor wax.  Remove throw rugs and tripping hazards from the floor. STAIRWAYS  Never leave objects on stairs.  Place handrails on both sides of stairways and use them. Fix any loose handrails. Make sure handrails on both sides of the stairways are as long as the stairs.  Check carpeting to make sure it is firmly attached along stairs. Make repairs to worn or loose carpet promptly.  Avoid placing throw rugs at the top or bottom of stairways, or properly secure the rug with carpet tape to prevent slippage. Get rid of throw rugs, if possible.  Have an electrician put in a light switch at the top and bottom of the stairs. OTHER FALL PREVENTION TIPS  Wear low-heel or rubber-soled shoes that are supportive and fit well. Wear closed toe shoes.  When using a stepladder, make sure it is fully opened and both spreaders are firmly locked. Do not climb a closed stepladder.  Add color or contrast paint or tape to grab bars and handrails in your home. Place contrasting color strips on first and last steps.  Learn and use  mobility aids as needed. Install an electrical emergency response system.  Turn on lights to avoid dark areas. Replace light bulbs that burn out immediately. Get light switches that glow.  Arrange furniture to create clear pathways. Keep furniture in the same place.  Firmly attach carpet with non-skid or double-sided tape.  Eliminate uneven floor surfaces.  Select a carpet pattern that does not visually hide the edge of steps.  Be aware of all pets. OTHER HOME SAFETY TIPS  Set the water temperature for 120 F (48.8 C).  Keep emergency numbers on or near the  telephone.  Keep smoke detectors on every level of the home and near sleeping areas. Document Released: 02/20/2002 Document Revised: 09/01/2011 Document Reviewed: 05/22/2011 [facility] Patient Information 2015 [place], [place]. This information is not intended to replace advice given to you by your health care provider. Make sure you discuss any questions you have with your health care provider.   Preventive Care for Adults Ages 67 and over  Blood pressure check.** / Every 1 to 2 years.  Lipid and cholesterol check.**/ Every 5 years beginning at age 26.  Lung cancer screening. / Every year if you are aged 18-80 years and have a 30-pack-year history of smoking and currently smoke or have quit within the past 15 years. Yearly screening is stopped once you have quit smoking for at least 15 years or develop a health problem that would prevent you from having lung cancer treatment.  Fecal occult blood test (FOBT) of stool. / Every year beginning at age 46 and continuing until age 38. You may not have to do this test if you get a colonoscopy every 10 years.  Flexible sigmoidoscopy** or colonoscopy.** / Every 5 years for a flexible sigmoidoscopy or every 10 years for a colonoscopy beginning at age 43 and continuing until age 60.  Hepatitis C blood test.** / For all people born from [birth year] through 1966 and any individual with known risks for hepatitis C.  Abdominal aortic aneurysm (AAA) screening.** / A one-time screening for ages 14 to 57 years who are current or former smokers.  Skin self-exam. / Monthly.  Influenza vaccine. / Every year.  Tetanus, diphtheria, and acellular pertussis (Tdap/Td) vaccine.** / 1 dose of Td every 10 years.  Varicella vaccine.** / Consult your health care provider.  Zoster vaccine.** / 1 dose for adults aged 17 years or older.  Pneumococcal 13-valent conjugate (PCV13) vaccine.** / Consult your health care provider.  Pneumococcal polysaccharide (PPSV23) vaccine.**  / 1 dose for all adults aged 46 years and older.  Meningococcal vaccine.** / Consult your health care provider.  Hepatitis A vaccine.** / Consult your health care provider.  Hepatitis B vaccine.** / Consult your health care provider.  Haemophilus influenzae type b (Hib) vaccine.** / Consult your health care provider. **Family history and personal history of risk and conditions may change your health care provider's recommendations. Document Released: 04/28/2001 Document Revised: 03/07/2013 Document Reviewed: 07/28/2010 [facility] Patient Information 2015 [place], [place]. This information is not intended to replace advice given to you by your health care provider. Make sure you discuss any questions you have with your health care provider.

## 2014-09-07 NOTE — Telephone Encounter (Signed)
Dr. Aundra Dubin addressed this in result note for ECHO.   Notes Recorded by Larey Dresser, MD on 09/06/2014 at 11:07 PM I think lower heart rate is reasonable based on her age.  Closing encounter at this time, as patient had already been advised on Target Heart Rate and was in agreement.

## 2014-09-07 NOTE — Progress Notes (Signed)
Pre visit review using our clinic review tool, if applicable. No additional management support is needed unless otherwise documented below in the visit note. 

## 2014-09-07 NOTE — Progress Notes (Signed)
Subjective:    Patient ID: Mandy Durham, female    DOB: Sep 26, 1945, 69 y.o.   MRN: [id number]  DOS:  09/07/2014 Type of visit - description :  Here for Medicare AWV:  1. Risk factors based on Past M, S, F history: reviewed 2. Physical Activities: gym-spinning x 3/week, walks x 2/week 3. Depression/mood: neg screening  4. Hearing:  No problems noted or reported  5. ADL's: independent, drives  6. Fall Risk: no recent falls, prevention discussed , see AVS 7. home Safety: does feel safe at home  8. Height, weight, & visual acuity: see VS, last eye doctor visit 2 years ago, rec to schedule a OV 9. Counseling: provided 10. Labs ordered based on risk factors: if needed  11. Referral Coordination: if needed 12. Care Plan, see assessment and plan , written personalized plan provided , see AVS 13. Cognitive Assessment: motor skills and cognition appropriate for age 24. Care team updated, see Snap Shot  15. End-of-life care discussed   In addition, today we discussed the following: Insomnia, Ambien 5 mg is too strong and 2.5 mg too little. Alternatives? History of syncope, follow-up by cardiology, they recently increased the Mestinon dose Hypertension, good compliance with meds, today's BP very good    Review of Systems  Constitutional: No fever. No chills. No unexplained wt changes. No unusual sweats. 3 months history of decreased energy, fatigue. Denies feeling depressed, not feeling sleepy, no DOE or chest pain. She snores mildly sometimes  HEENT: No dental problems, no ear discharge, no facial swelling, no voice changes. No eye discharge, no eye  redness , no  intolerance to light   Respiratory: No wheezing , no  difficulty breathing. No cough , no mucus production  Cardiovascular: No CP, no leg swelling , no  Palpitations  GI: no nausea, no vomiting, no diarrhea , no  abdominal pain.  No blood in the stools. No dysphagia, no odynophagia    Endocrine: No polyphagia, no  polyuria , no polydipsia  GU: No dysuria, gross hematuria, difficulty urinating. No urinary urgency, no frequency.  Musculoskeletal: No joint swellings or unusual aches or pains  Skin: 2 weeks history of a itchy rash at the flexion aspect of the elbows and neck. Not using any new jewerly, using OTC creams without much help  Allergic, immunologic: No environmental allergies , no  food allergies  Neurological: No dizziness no  syncope. No headaches. No diplopia, no slurred, no slurred speech, no motor deficits, no facial  Numbness  Hematological: No enlarged lymph nodes, no easy bruising , no unusual bleedings  Psychiatry: No suicidal ideas, no hallucinations, no beavior problems, no confusion.  No unusual/severe anxiety, no depression   Past Medical History  Diagnosis Date  . Insomnia   . Adenomatous polyp of colon   . Overweight(278.02)   . Symptomatic menopausal or female climacteric states   . Hyperlipidemia   . Osteoarthrosis, unspecified whether generalized or localized, unspecified site   . Anxiety state, unspecified   . Hypertension   . Type 2 diabetes mellitus   . Syncope and collapse     11/12: Holter 12/12 with rare PACS, HR range 64-140, average 82, no significant arrhythmias. Echo (1/13): EF 50-55%, mild LVH, septal-lateral dyssynchrony c/w LBBB. 3-week event monitor (1/13): No significant arrhythmia.   Marland Kitchen LBBB (left bundle branch block)     LHC in 2002 showed normal coronaries.     Past Surgical History  Procedure Laterality Date  . Tubal ligation  History   Social History  . Marital Status: Married    Spouse Name: N/A  . Number of Children: 2  . Years of Education: N/A   Occupational History  . retired Education administrator   Social History Main Topics  . Smoking status: Never Smoker   . Smokeless tobacco: Never Used  . Alcohol Use: No  . Drug Use: No  . Sexual Activity: Not on file   Other Topics Concern  . Not on  file   Social History Narrative   HSG.  Married '65.  2 daughters, '71, '77; 4 grandchildren              Family History  Problem Relation Age of Onset  . Heart failure Mother   . Coronary artery disease Mother   . Dementia Mother   . Diabetes Mother   . Colon cancer Neg Hx   . Breast cancer Neg Hx   . Hypertension Neg Hx   . Heart disease Neg Hx   . Heart attack Neg Hx   . Stroke Neg Hx        Medication List       This list is accurate as of: 09/07/14 11:59 PM.  Always use your most recent med list.               ALPRAZolam 0.25 MG tablet  Commonly known as:  XANAX  Take 1-2 tablets (0.25-0.5 mg total) by mouth at bedtime as needed for sleep.     BRISDELLE 7.5 MG Caps  Generic drug:  PARoxetine Mesylate  Take 10 mg by mouth daily.     calcium carbonate 600 MG Tabs tablet  Commonly known as:  OS-CAL  Take 1,200 mg by mouth daily with breakfast.     cholecalciferol 1000 UNITS tablet  Commonly known as:  VITAMIN D  Take 2,000 Units by mouth daily.     hydrocortisone 2.5 % cream  Apply topically 2 (two) times daily.     metoprolol tartrate 25 MG tablet  Commonly known as:  LOPRESSOR  Take 1 tablet (25 mg total) by mouth 2 (two) times daily.     pyridostigmine 60 MG tablet  Commonly known as:  MESTINON  Take 1 tablet (60 mg total) by mouth 3 (three) times daily.           Objective:   Physical Exam  Skin:      BP 124/76 mmHg  Pulse 57  Temp(Src) 97.7 F (36.5 C) (Oral)  Ht 5\' 6"  (1.676 m)  Wt 153 lb (69.4 kg)  BMI 24.71 kg/m2  SpO2 96% General:   Well developed, well nourished . NAD.  Neck:  Full range of motion. Supple. No  thyromegaly , normal carotid pulse HEENT:  Normocephalic . Face symmetric, atraumatic Lungs:  CTA B Normal respiratory effort, no intercostal retractions, no accessory muscle use. Heart: RRR,  no murmur.  No pretibial edema bilaterally  Abdomen:  Not distended, soft, non-tender. No rebound or rigidity. No  mass,organomegaly Skin: See graphic Neurologic:  alert & oriented X3.  Speech normal, gait appropriate for age and unassisted Strength symmetric and appropriate for age.  Psych: Cognition and judgment appear intact.  Cooperative with normal attention span and concentration.  Behavior appropriate. No anxious or depressed appearing.        Assessment & Plan:     ---------------------------- Fatigue, review of systems negative for possible sleep apnea or depression. Will check TSH, vitamin D and CBC  Syncope, history of : managed by cardiology  Eczema, see HPIrecommend a high potency steroid cream  Hypertension, well controlled, check labs   Prediabetes, check A1c.  Follow-up in 6 months

## 2014-09-08 NOTE — Assessment & Plan Note (Addendum)
  Flu vaccine--11/14/2013 Tdap--02/12/2011 PNA--23/10-2012 prevnar-- 08-2014 Shingles--12/2012  Female care  Pap--deferred -- [facility]--5/ 2016--neg bi rads--q 2 years  Bone Density--02/2011-- T score -1.5  rec ca and vit D   CCS--01/01/2006-- see report below, due for a cscope, refer to GI BIOPSY: SERRATED ADENOMA AND BENIGN HYPERPLASTIC CHANGES INVOLVING PORTION OF THE POLYP. HIGH GRADE DYSPLASIA OR MALIGNANCY IS NOT IDENTIFIED. GI message  "Can you let Dr. Larose Kells know patient is not due until 09/2015 for repeat colonoscopy. If she is not having any current issues, we can hold off scheduling until patient is due. Thanks for your help" ---> will let pt know  JP 09-11-14 Exercise discussed

## 2014-09-10 ENCOUNTER — Other Ambulatory Visit: Payer: Self-pay

## 2014-09-11 ENCOUNTER — Telehealth: Payer: Self-pay

## 2014-09-11 LAB — LIPID PANEL
Cholesterol: 254 mg/dL — ABNORMAL HIGH (ref 0–200)
HDL: 90.8 mg/dL (ref >39.00)
LDL Cholesterol: 148 mg/dL — ABNORMAL HIGH (ref 0–99)
NONHDL: 163.2
Total CHOL/HDL Ratio: 3
Triglycerides: 76 mg/dL (ref 0.0–149.0)
VLDL: 15.2 mg/dL (ref 0.0–40.0)

## 2014-09-11 LAB — CBC WITH DIFFERENTIAL/PLATELET
BASOS ABS: 0 10*3/uL (ref 0.0–0.1)
Basophils Relative: 0.5 % (ref 0.0–3.0)
Eosinophils Absolute: 0.2 10*3/uL (ref 0.0–0.7)
Eosinophils Relative: 3.3 % (ref 0.0–5.0)
HCT: 40.1 % (ref 36.0–46.0)
Hemoglobin: 13.4 g/dL (ref 12.0–15.0)
LYMPHS PCT: 30.3 % (ref 12.0–46.0)
Lymphs Abs: 2 10*3/uL (ref 0.7–4.0)
MCHC: 33.5 g/dL (ref 30.0–36.0)
MCV: 84.7 fl (ref 78.0–100.0)
Monocytes Absolute: 0.6 10*3/uL (ref 0.1–1.0)
Monocytes Relative: 9 % (ref 3.0–12.0)
NEUTROS ABS: 3.8 10*3/uL (ref 1.4–7.7)
Neutrophils Relative %: 56.9 % (ref 43.0–77.0)
PLATELETS: 333 10*3/uL (ref 150.0–400.0)
RBC: 4.74 Mil/uL (ref 3.87–5.11)
RDW: 13.9 % (ref 11.5–15.5)
WBC: 6.7 10*3/uL (ref 4.0–10.5)

## 2014-09-11 LAB — HEMOGLOBIN A1C: Hgb A1c MFr Bld: 6.1 % (ref 4.6–6.5)

## 2014-09-11 LAB — TSH: TSH: 3.1 u[IU]/mL (ref 0.35–4.50)

## 2014-09-11 NOTE — Addendum Note (Signed)
Addended by: Peggyann Shoals on: 09/11/2014 08:05 AM   Modules accepted: Orders

## 2014-09-11 NOTE — Addendum Note (Signed)
Addended by: Kathlene November E on: 09/11/2014 01:09 PM   Modules accepted: Miquel Dunn

## 2014-09-11 NOTE — Telephone Encounter (Signed)
LMOM informing Pt to return call. Wanted to inform Pt that per GI she is not due for next cscope until 09/2015.

## 2014-09-11 NOTE — Telephone Encounter (Signed)
Informed patient of this.  °

## 2014-09-12 NOTE — Telephone Encounter (Signed)
Noted! Thank you

## 2014-09-13 ENCOUNTER — Telehealth: Payer: Self-pay | Admitting: Cardiology

## 2014-09-13 LAB — VITAMIN D 1,25 DIHYDROXY
VITAMIN D 1, 25 (OH) TOTAL: 49 pg/mL (ref 18–72)
VITAMIN D3 1, 25 (OH): 49 pg/mL
Vitamin D2 1, 25 (OH)2: <8 pg/mL

## 2014-09-13 NOTE — Telephone Encounter (Signed)
Following up   Pt returning this office call please give her a call back.

## 2014-09-13 NOTE — Telephone Encounter (Signed)
Discussed recent echo results with patient.

## 2014-09-19 ENCOUNTER — Encounter: Payer: Self-pay | Admitting: Cardiology

## 2014-09-20 ENCOUNTER — Other Ambulatory Visit: Payer: Self-pay

## 2014-09-20 DIAGNOSIS — R011 Cardiac murmur, unspecified: Secondary | ICD-10-CM

## 2014-09-20 MED ORDER — PYRIDOSTIGMINE BROMIDE 60 MG PO TABS: 60.0000 mg | ORAL_TABLET | Freq: Three times a day (TID) | ORAL | Status: DC

## 2014-10-01 ENCOUNTER — Encounter: Payer: Self-pay | Admitting: Internal Medicine

## 2014-10-01 ENCOUNTER — Other Ambulatory Visit: Payer: Self-pay | Admitting: Internal Medicine

## 2014-10-01 NOTE — Telephone Encounter (Signed)
Rx faxed to [facility]  

## 2014-10-01 NOTE — Telephone Encounter (Signed)
Okay per Dr. Larose Kells to give 60 tablets instead of 40. Rx printed, awaiting MD signature.

## 2014-10-01 NOTE — Telephone Encounter (Signed)
Pt is requesting refill on Alprazolam.  Last OV: 09/07/2014  Last Fill: 09/07/2014 #40 0RF UDS: None, just started Xanax  Please advise

## 2014-10-01 NOTE — Telephone Encounter (Signed)
Okay #40, no refills

## 2014-10-31 ENCOUNTER — Ambulatory Visit: Payer: Medicare HMO | Admitting: Physician Assistant

## 2014-11-07 ENCOUNTER — Ambulatory Visit: Payer: Medicare HMO

## 2014-11-17 ENCOUNTER — Other Ambulatory Visit: Payer: Self-pay | Admitting: Internal Medicine

## 2014-11-20 NOTE — Telephone Encounter (Signed)
Pt is requesting refill on Alprazolam.  Last OV: 09/07/2014 Last Fill: 10/01/2014 #60 0RF UDS: None  Due for UDS and contract at next appt.  Please advise.

## 2014-11-20 NOTE — Telephone Encounter (Signed)
Rx printed, awaiting MD signature.  

## 2014-11-20 NOTE — Telephone Encounter (Signed)
Rx faxed to [facility]  

## 2014-11-20 NOTE — Telephone Encounter (Signed)
Ok 60 and 1 Rf

## 2014-12-19 ENCOUNTER — Ambulatory Visit (INDEPENDENT_AMBULATORY_CARE_PROVIDER_SITE_OTHER): Payer: Medicare HMO

## 2014-12-19 DIAGNOSIS — Z23 Encounter for immunization: Secondary | ICD-10-CM

## 2014-12-19 NOTE — Progress Notes (Signed)
Pt tolerated injection well

## 2014-12-19 NOTE — Progress Notes (Signed)
Pre visit review using our clinic review tool, if applicable. No additional management support is needed unless otherwise documented below in the visit note. 

## 2015-01-15 ENCOUNTER — Other Ambulatory Visit: Payer: Self-pay | Admitting: Cardiology

## 2015-01-16 ENCOUNTER — Encounter: Payer: Self-pay | Admitting: Cardiology

## 2015-01-17 ENCOUNTER — Other Ambulatory Visit: Payer: Self-pay | Admitting: *Deleted

## 2015-01-17 DIAGNOSIS — R011 Cardiac murmur, unspecified: Secondary | ICD-10-CM

## 2015-01-17 MED ORDER — METOPROLOL TARTRATE 25 MG PO TABS: 25.0000 mg | ORAL_TABLET | Freq: Two times a day (BID) | ORAL | Status: DC

## 2015-01-21 ENCOUNTER — Emergency Department (HOSPITAL_COMMUNITY): Payer: Medicare HMO

## 2015-01-21 ENCOUNTER — Encounter (HOSPITAL_COMMUNITY): Payer: Self-pay | Admitting: Emergency Medicine

## 2015-01-21 ENCOUNTER — Inpatient Hospital Stay (HOSPITAL_COMMUNITY)
Admission: EM | Admit: 2015-01-21 | Discharge: 2015-01-28 | DRG: 087 | Disposition: A | Discharge status: Rehabilitation Facility | Payer: Medicare HMO | Attending: Internal Medicine | Admitting: Internal Medicine

## 2015-01-21 DIAGNOSIS — S066X1A Traumatic subarachnoid hemorrhage with loss of consciousness of 30 minutes or less, initial encounter: Secondary | ICD-10-CM | POA: Diagnosis not present

## 2015-01-21 DIAGNOSIS — W1830XA Fall on same level, unspecified, initial encounter: Secondary | ICD-10-CM | POA: Diagnosis present

## 2015-01-21 DIAGNOSIS — I951 Orthostatic hypotension: Secondary | ICD-10-CM | POA: Diagnosis present

## 2015-01-21 DIAGNOSIS — S0291XA Unspecified fracture of skull, initial encounter for closed fracture: Secondary | ICD-10-CM | POA: Diagnosis present

## 2015-01-21 DIAGNOSIS — E785 Hyperlipidemia, unspecified: Secondary | ICD-10-CM | POA: Diagnosis present

## 2015-01-21 DIAGNOSIS — M1990 Unspecified osteoarthritis, unspecified site: Secondary | ICD-10-CM | POA: Diagnosis present

## 2015-01-21 DIAGNOSIS — E876 Hypokalemia: Secondary | ICD-10-CM | POA: Diagnosis present

## 2015-01-21 DIAGNOSIS — I10 Essential (primary) hypertension: Secondary | ICD-10-CM | POA: Diagnosis present

## 2015-01-21 DIAGNOSIS — Z8249 Family history of ischemic heart disease and other diseases of the circulatory system: Secondary | ICD-10-CM

## 2015-01-21 DIAGNOSIS — I447 Left bundle-branch block, unspecified: Secondary | ICD-10-CM | POA: Diagnosis present

## 2015-01-21 DIAGNOSIS — S066X9A Traumatic subarachnoid hemorrhage with loss of consciousness of unspecified duration, initial encounter: Secondary | ICD-10-CM | POA: Diagnosis not present

## 2015-01-21 DIAGNOSIS — R410 Disorientation, unspecified: Secondary | ICD-10-CM | POA: Diagnosis not present

## 2015-01-21 DIAGNOSIS — R55 Syncope and collapse: Secondary | ICD-10-CM | POA: Diagnosis present

## 2015-01-21 DIAGNOSIS — R42 Dizziness and giddiness: Secondary | ICD-10-CM | POA: Diagnosis present

## 2015-01-21 DIAGNOSIS — I609 Nontraumatic subarachnoid hemorrhage, unspecified: Secondary | ICD-10-CM

## 2015-01-21 DIAGNOSIS — S069X9A Unspecified intracranial injury with loss of consciousness of unspecified duration, initial encounter: Secondary | ICD-10-CM | POA: Diagnosis present

## 2015-01-21 DIAGNOSIS — T380X5A Adverse effect of glucocorticoids and synthetic analogues, initial encounter: Secondary | ICD-10-CM | POA: Diagnosis not present

## 2015-01-21 DIAGNOSIS — Y92000 Kitchen of unspecified non-institutional (private) residence as  the place of occurrence of the external cause: Secondary | ICD-10-CM

## 2015-01-21 DIAGNOSIS — E119 Type 2 diabetes mellitus without complications: Secondary | ICD-10-CM | POA: Diagnosis present

## 2015-01-21 DIAGNOSIS — S02119A Unspecified fracture of occiput, initial encounter for closed fracture: Secondary | ICD-10-CM | POA: Diagnosis present

## 2015-01-21 DIAGNOSIS — F411 Generalized anxiety disorder: Secondary | ICD-10-CM | POA: Diagnosis present

## 2015-01-21 DIAGNOSIS — Z79899 Other long term (current) drug therapy: Secondary | ICD-10-CM

## 2015-01-21 DIAGNOSIS — G4700 Insomnia, unspecified: Secondary | ICD-10-CM | POA: Diagnosis present

## 2015-01-21 DIAGNOSIS — S0101XA Laceration without foreign body of scalp, initial encounter: Secondary | ICD-10-CM | POA: Diagnosis present

## 2015-01-21 LAB — CBC
HEMATOCRIT: 39 % (ref 36.0–46.0)
Hemoglobin: 12.8 g/dL (ref 12.0–15.0)
MCH: 27.9 pg (ref 26.0–34.0)
MCHC: 32.8 g/dL (ref 30.0–36.0)
MCV: 85.2 fL (ref 78.0–100.0)
Platelets: 304 10*3/uL (ref 150–400)
RBC: 4.58 MIL/uL (ref 3.87–5.11)
RDW: 13.4 % (ref 11.5–15.5)
WBC: 13.2 10*3/uL — AB (ref 4.0–10.5)

## 2015-01-21 LAB — BASIC METABOLIC PANEL
ANION GAP: 6 (ref 5–15)
BUN: 13 mg/dL (ref 6–20)
CALCIUM: 8.8 mg/dL — AB (ref 8.9–10.3)
CO2: 27 mmol/L (ref 22–32)
Chloride: 105 mmol/L (ref 101–111)
Creatinine, Ser: 0.7 mg/dL (ref 0.44–1.00)
GFR calc Af Amer: >60 mL/min (ref >60)
Glucose, Bld: 150 mg/dL — ABNORMAL HIGH (ref 65–99)
POTASSIUM: 4 mmol/L (ref 3.5–5.1)
SODIUM: 138 mmol/L (ref 135–145)

## 2015-01-21 LAB — I-STAT TROPONIN, ED: TROPONIN I, POC: 0 ng/mL (ref 0.00–0.08)

## 2015-01-21 LAB — CBG MONITORING, ED: GLUCOSE-CAPILLARY: 141 mg/dL — AB (ref 65–99)

## 2015-01-21 MED ORDER — PANTOPRAZOLE SODIUM 40 MG IV SOLR
40.0000 mg | Freq: Every day | INTRAVENOUS | Status: DC
  Filled 2015-01-21: qty 40

## 2015-01-21 MED ORDER — ACETAMINOPHEN 650 MG RE SUPP: 650.0000 mg | RECTAL | Status: DC | PRN

## 2015-01-21 MED ORDER — LABETALOL HCL 5 MG/ML IV SOLN: 10.0000 mg | INTRAVENOUS | Status: DC | PRN

## 2015-01-21 MED ORDER — ONDANSETRON HCL 4 MG/2ML IJ SOLN
4.0000 mg | Freq: Once | INTRAMUSCULAR | Status: AC
  Administered 2015-01-21: 4 mg via INTRAVENOUS
  Filled 2015-01-21: qty 2

## 2015-01-21 MED ORDER — IOHEXOL 350 MG/ML SOLN
50.0000 mL | Freq: Once | INTRAVENOUS | Status: AC | PRN
  Administered 2015-01-21: 50 mL via INTRAVENOUS

## 2015-01-21 MED ORDER — ONDANSETRON HCL 4 MG/2ML IJ SOLN
4.0000 mg | Freq: Once | INTRAMUSCULAR | Status: DC
  Filled 2015-01-21: qty 2

## 2015-01-21 MED ORDER — ACETAMINOPHEN 325 MG PO TABS
650.0000 mg | ORAL_TABLET | ORAL | Status: DC | PRN
  Administered 2015-01-27: 650 mg via ORAL
  Filled 2015-01-21: qty 2

## 2015-01-21 MED ORDER — SENNOSIDES-DOCUSATE SODIUM 8.6-50 MG PO TABS
1.0000 | ORAL_TABLET | Freq: Two times a day (BID) | ORAL | Status: DC
  Administered 2015-01-22 – 2015-01-28 (×9): 1 via ORAL
  Filled 2015-01-21 (×15): qty 1

## 2015-01-21 MED ORDER — SODIUM CHLORIDE 0.9 % IV SOLN
INTRAVENOUS | Status: DC
Start: 2015-01-22 — End: 2015-01-25
  Administered 2015-01-22 – 2015-01-24 (×3): via INTRAVENOUS

## 2015-01-21 MED ORDER — HYDROMORPHONE HCL 1 MG/ML IJ SOLN: 0.5000 mg | INTRAMUSCULAR | Status: DC | PRN

## 2015-01-21 NOTE — ED Notes (Signed)
CBG - 141 ° °

## 2015-01-21 NOTE — ED Notes (Signed)
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     Pt necklace also given to her husband upon arrival to ER.

## 2015-01-21 NOTE — H&P (Signed)
Mandy Durham is an 69 y.o. female.   Chief Complaint: Closed head injury subarachnoid hemorrhage HPI: Patient is a 69 year old female has had a problem with syncopal episodes in the past she's followed by Dr. Belinda Fisher who had a syncopal episode while in kitchen today and fell struck her head and is amnestic of the event. Currently she complaining of headaches denies any neck pain denies any nausea vomiting denies a numbness in her arms or legs. She brought to ER was noted to have a small skim subdural contusion and some sylvian subarachnoid hemorrhage  Past Medical History  Diagnosis Date  . Insomnia   . Adenomatous polyp of colon   . Overweight(278.02)   . Symptomatic menopausal or female climacteric states   . Hyperlipidemia   . Osteoarthrosis, unspecified whether generalized or localized, unspecified site   . Anxiety state, unspecified   . Hypertension   . Type 2 diabetes mellitus ([place])   . Syncope and collapse     11/12: Holter 12/12 with rare PACS, HR range 64-140, average 82, no significant arrhythmias. Echo (1/13): EF 50-55%, mild LVH, septal-lateral dyssynchrony c/w LBBB. 3-week event monitor (1/13): No significant arrhythmia.   Marland Kitchen LBBB (left bundle branch block)     LHC in 2002 showed normal coronaries.     Past Surgical History  Procedure Laterality Date  . Tubal ligation      Family History  Problem Relation Age of Onset  . Heart failure Mother   . Coronary artery disease Mother   . Dementia Mother   . Diabetes Mother   . Colon cancer Neg Hx   . Breast cancer Neg Hx   . Hypertension Neg Hx   . Heart disease Neg Hx   . Heart attack Neg Hx   . Stroke Neg Hx    Social History:  reports that she has never smoked. She has never used smokeless tobacco. She reports that she does not drink alcohol or use illicit drugs.  Allergies: No Known Allergies   (Not in a hospital admission)  Results for orders placed or performed during the hospital encounter of 01/21/15 (from  the past 48 hour(s))  CBG monitoring, ED     Status: Abnormal   Collection Time: 01/21/15  7:54 PM  Result Value Ref Range   Glucose-Capillary 141 (H) 65 - 99 mg/dL  Basic metabolic panel     Status: Abnormal   Collection Time: 01/21/15  8:38 PM  Result Value Ref Range   Sodium 138 135 - 145 mmol/L   Potassium 4.0 3.5 - 5.1 mmol/L   Chloride 105 101 - 111 mmol/L   CO2 27 22 - 32 mmol/L   Glucose, Bld 150 (H) 65 - 99 mg/dL   BUN 13 6 - 20 mg/dL   Creatinine, Ser 0.70 0.44 - 1.00 mg/dL   Calcium 8.8 (L) 8.9 - 10.3 mg/dL   GFR calc non Af Amer >60 >60 mL/min   GFR calc Af Amer >60 >60 mL/min    Comment: (NOTE) The eGFR has been calculated using the CKD EPI equation. This calculation has not been validated in all clinical situations. eGFR's persistently <60 mL/min signify possible Chronic Kidney Disease.    Anion gap 6 5 - 15  CBC     Status: Abnormal   Collection Time: 01/21/15  8:38 PM  Result Value Ref Range   WBC 13.2 (H) 4.0 - 10.5 K/uL   RBC 4.58 3.87 - 5.11 MIL/uL   Hemoglobin 12.8 12.0 -  15.0 g/dL   HCT 39.0 36.0 - 46.0 %   MCV 85.2 78.0 - 100.0 fL   MCH 27.9 26.0 - 34.0 pg   MCHC 32.8 30.0 - 36.0 g/dL   RDW 13.4 11.5 - 15.5 %   Platelets 304 150 - 400 K/uL  I-Stat Troponin, ED (not at [facility])     Status: None   Collection Time: 01/21/15  8:44 PM  Result Value Ref Range   Troponin i, poc 0.00 0.00 - 0.08 ng/mL   Comment 3            Comment: Due to the release kinetics of cTnI, a negative result within the first hours of the onset of symptoms does not rule out myocardial infarction with certainty. If myocardial infarction is still suspected, repeat the test at appropriate intervals.    Dg Chest 2 View  01/21/2015  CLINICAL DATA:  Syncopal episode at home tonight.  Hit head. EXAM: CHEST  2 VIEW COMPARISON:  None. FINDINGS: The cardiac silhouette, mediastinal and hilar contours are within normal limits. The lungs are clear. Mild biapical pleural thickening. The bony  thorax is intact. IMPRESSION: No acute cardiopulmonary findings. Electronically Signed   By: Marijo Sanes M.D.   On: 01/21/2015 22:26   Ct Head Wo Contrast  01/21/2015  CLINICAL DATA:  Fall in kitchen with occipital head injury. Loss of consciousness before fall. Initial encounter. EXAM: CT HEAD WITHOUT CONTRAST CT CERVICAL SPINE WITHOUT CONTRAST TECHNIQUE: Multidetector CT imaging of the head and cervical spine was performed following the standard protocol without intravenous contrast. Multiplanar CT image reconstructions of the cervical spine were also generated. COMPARISON:  None. FINDINGS: CT HEAD FINDINGS Skull and Sinuses:There is a left occipital scalp laceration and contusion with nondepressed oblique fracture through the occipital bone. Clear sinuses and mastoids. Orbits: No traumatic findings Brain: Moderate subarachnoid hemorrhage in and around the right sylvian fissure. Given the history, this is presumably traumatic. If there are clinical features concerning for primary aneurysmal hemorrhage, CTA could be obtained. There is a small subdural hematoma around the right cerebral convexity, measuring up to 4 mm thickness. Inferior right frontal parenchymal hemorrhage measuring 7 mm, with surrounding edema. Local mass effect without midline shift or herniation. No pre-existing infarct noted. No hydrocephalus or visible mass lesion. CT CERVICAL SPINE FINDINGS Negative for acute fracture or subluxation. No prevertebral edema. No gross cervical canal hematoma. Lower cervical degenerative disc narrowing with reversal cervical lordosis. Uncovertebral spurs at C5-6 and C6-7 cause mild foraminal narrowing. Critical Value/emergent results were called by telephone at the time of interpretation on 01/21/2015 at 10:31 pm to Dr. Lonia Skinner , who verbally acknowledged these results. IMPRESSION: 1. Moderate subarachnoid hemorrhage around the right sylvian fissure. 2. Inferior right frontal hemorrhagic contusion with  mild local mass effect. 3. Thin subdural hematoma around the right cerebral convexity. 4. Nondepressed occipital bone fracture. 5. No evidence of cervical spine injury. Electronically Signed   By: Monte Fantasia M.D.   On: 01/21/2015 22:33   Ct Cervical Spine Wo Contrast  01/21/2015  CLINICAL DATA:  Fall in kitchen with occipital head injury. Loss of consciousness before fall. Initial encounter. EXAM: CT HEAD WITHOUT CONTRAST CT CERVICAL SPINE WITHOUT CONTRAST TECHNIQUE: Multidetector CT imaging of the head and cervical spine was performed following the standard protocol without intravenous contrast. Multiplanar CT image reconstructions of the cervical spine were also generated. COMPARISON:  None. FINDINGS: CT HEAD FINDINGS Skull and Sinuses:There is a left occipital scalp laceration and contusion with  nondepressed oblique fracture through the occipital bone. Clear sinuses and mastoids. Orbits: No traumatic findings Brain: Moderate subarachnoid hemorrhage in and around the right sylvian fissure. Given the history, this is presumably traumatic. If there are clinical features concerning for primary aneurysmal hemorrhage, CTA could be obtained. There is a small subdural hematoma around the right cerebral convexity, measuring up to 4 mm thickness. Inferior right frontal parenchymal hemorrhage measuring 7 mm, with surrounding edema. Local mass effect without midline shift or herniation. No pre-existing infarct noted. No hydrocephalus or visible mass lesion. CT CERVICAL SPINE FINDINGS Negative for acute fracture or subluxation. No prevertebral edema. No gross cervical canal hematoma. Lower cervical degenerative disc narrowing with reversal cervical lordosis. Uncovertebral spurs at C5-6 and C6-7 cause mild foraminal narrowing. Critical Value/emergent results were called by telephone at the time of interpretation on 01/21/2015 at 10:31 pm to Dr. Lonia Skinner , who verbally acknowledged these results. IMPRESSION: 1.  Moderate subarachnoid hemorrhage around the right sylvian fissure. 2. Inferior right frontal hemorrhagic contusion with mild local mass effect. 3. Thin subdural hematoma around the right cerebral convexity. 4. Nondepressed occipital bone fracture. 5. No evidence of cervical spine injury. Electronically Signed   By: Monte Fantasia M.D.   On: 01/21/2015 22:33    Review of Systems  Constitutional: Negative.   Eyes: Negative.   Respiratory: Negative.   Cardiovascular: Negative.   Gastrointestinal: Negative.   Genitourinary: Negative.   Musculoskeletal: Negative.   Skin: Negative.   Neurological: Positive for headaches.  Endo/Heme/Allergies: Negative.   Psychiatric/Behavioral: Negative.     Blood pressure 154/64, pulse 71, temperature 97.5 F (36.4 C), temperature source Oral, resp. rate 17, height 5' 6.5" (1.689 m), weight 51.71 kg (114 lb), SpO2 93 %. Physical Exam  Constitutional: She is oriented to person, place, and time. She appears well-developed and well-nourished.  HENT:  Head: Normocephalic.  Eyes: Pupils are equal, round, and reactive to light.  Neck: Normal range of motion.  Respiratory: Effort normal.  GI: Soft.  Musculoskeletal: Normal range of motion.  Neurological: She is alert and oriented to person, place, and time.  Patient is awake and alert oriented 4 pupils equal excellent was intact strength is 5 out of 5 bilateral upper and lower extremities. Dense amount of matted dry blood in the back of her head tenderness around the inion  Skin: Skin is warm and dry.     Assessment/Plan 69 year old female with what appears to be traumatic subarachnoid hemorrhage right frontal temporal contusion and small skim subdural with etiology being syncope and trauma. Will get medicine to see her and admit her for observation.  Janae Bonser P 01/21/2015, 11:58 PM

## 2015-01-21 NOTE — ED Notes (Signed)
Per [facility] unclear medical history. Syncopal episode years ago. Husband heard her fall and witnessed her unconscious in the kitchen. Approx 8-10 min. Pt alert to self when EMS arrived. Pt stated pain iun the back of her head. N/V approx 3x w/ EMS, 4mg  of zofran. Retrograde amnesia. Incontinent of stool (new onset).

## 2015-01-22 ENCOUNTER — Other Ambulatory Visit (HOSPITAL_COMMUNITY): Payer: Medicare HMO

## 2015-01-22 ENCOUNTER — Ambulatory Visit (HOSPITAL_COMMUNITY): Payer: Medicare HMO

## 2015-01-22 ENCOUNTER — Observation Stay (HOSPITAL_COMMUNITY): Payer: Medicare HMO

## 2015-01-22 ENCOUNTER — Encounter (HOSPITAL_COMMUNITY): Payer: Self-pay | Admitting: Internal Medicine

## 2015-01-22 DIAGNOSIS — R55 Syncope and collapse: Secondary | ICD-10-CM

## 2015-01-22 DIAGNOSIS — G4700 Insomnia, unspecified: Secondary | ICD-10-CM | POA: Diagnosis present

## 2015-01-22 DIAGNOSIS — S0101XA Laceration without foreign body of scalp, initial encounter: Secondary | ICD-10-CM | POA: Diagnosis present

## 2015-01-22 DIAGNOSIS — T380X5A Adverse effect of glucocorticoids and synthetic analogues, initial encounter: Secondary | ICD-10-CM | POA: Diagnosis not present

## 2015-01-22 DIAGNOSIS — S066X1A Traumatic subarachnoid hemorrhage with loss of consciousness of 30 minutes or less, initial encounter: Secondary | ICD-10-CM | POA: Diagnosis present

## 2015-01-22 DIAGNOSIS — S020XXA Fracture of vault of skull, initial encounter for closed fracture: Secondary | ICD-10-CM | POA: Diagnosis not present

## 2015-01-22 DIAGNOSIS — I609 Nontraumatic subarachnoid hemorrhage, unspecified: Secondary | ICD-10-CM | POA: Diagnosis not present

## 2015-01-22 DIAGNOSIS — G44319 Acute post-traumatic headache, not intractable: Secondary | ICD-10-CM | POA: Diagnosis not present

## 2015-01-22 DIAGNOSIS — I10 Essential (primary) hypertension: Secondary | ICD-10-CM | POA: Diagnosis not present

## 2015-01-22 DIAGNOSIS — E876 Hypokalemia: Secondary | ICD-10-CM | POA: Diagnosis present

## 2015-01-22 DIAGNOSIS — M1990 Unspecified osteoarthritis, unspecified site: Secondary | ICD-10-CM | POA: Diagnosis present

## 2015-01-22 DIAGNOSIS — S069X9S Unspecified intracranial injury with loss of consciousness of unspecified duration, sequela: Secondary | ICD-10-CM | POA: Diagnosis not present

## 2015-01-22 DIAGNOSIS — E785 Hyperlipidemia, unspecified: Secondary | ICD-10-CM | POA: Diagnosis present

## 2015-01-22 DIAGNOSIS — I447 Left bundle-branch block, unspecified: Secondary | ICD-10-CM | POA: Diagnosis present

## 2015-01-22 DIAGNOSIS — W1830XA Fall on same level, unspecified, initial encounter: Secondary | ICD-10-CM | POA: Diagnosis present

## 2015-01-22 DIAGNOSIS — S069X3S Unspecified intracranial injury with loss of consciousness of 1 hour to 5 hours 59 minutes, sequela: Secondary | ICD-10-CM | POA: Diagnosis not present

## 2015-01-22 DIAGNOSIS — Z8249 Family history of ischemic heart disease and other diseases of the circulatory system: Secondary | ICD-10-CM | POA: Diagnosis not present

## 2015-01-22 DIAGNOSIS — S069X0A Unspecified intracranial injury without loss of consciousness, initial encounter: Secondary | ICD-10-CM | POA: Diagnosis not present

## 2015-01-22 DIAGNOSIS — R410 Disorientation, unspecified: Secondary | ICD-10-CM | POA: Diagnosis not present

## 2015-01-22 DIAGNOSIS — S066X9A Traumatic subarachnoid hemorrhage with loss of consciousness of unspecified duration, initial encounter: Secondary | ICD-10-CM | POA: Diagnosis present

## 2015-01-22 DIAGNOSIS — Z79899 Other long term (current) drug therapy: Secondary | ICD-10-CM | POA: Diagnosis not present

## 2015-01-22 DIAGNOSIS — E119 Type 2 diabetes mellitus without complications: Secondary | ICD-10-CM | POA: Diagnosis present

## 2015-01-22 DIAGNOSIS — E639 Nutritional deficiency, unspecified: Secondary | ICD-10-CM | POA: Diagnosis not present

## 2015-01-22 DIAGNOSIS — Y92000 Kitchen of unspecified non-institutional (private) residence as  the place of occurrence of the external cause: Secondary | ICD-10-CM | POA: Diagnosis not present

## 2015-01-22 DIAGNOSIS — I951 Orthostatic hypotension: Secondary | ICD-10-CM | POA: Diagnosis present

## 2015-01-22 DIAGNOSIS — S02119A Unspecified fracture of occiput, initial encounter for closed fracture: Secondary | ICD-10-CM | POA: Diagnosis present

## 2015-01-22 DIAGNOSIS — F411 Generalized anxiety disorder: Secondary | ICD-10-CM | POA: Diagnosis present

## 2015-01-22 LAB — CBC WITH DIFFERENTIAL/PLATELET
Basophils Absolute: 0 10*3/uL (ref 0.0–0.1)
Basophils Relative: 0 %
EOS ABS: 0 10*3/uL (ref 0.0–0.7)
EOS PCT: 0 %
HCT: 39.5 % (ref 36.0–46.0)
Hemoglobin: 13.1 g/dL (ref 12.0–15.0)
LYMPHS ABS: 1.1 10*3/uL (ref 0.7–4.0)
LYMPHS PCT: 10 %
MCH: 28.5 pg (ref 26.0–34.0)
MCHC: 33.2 g/dL (ref 30.0–36.0)
MCV: 85.9 fL (ref 78.0–100.0)
MONO ABS: 0.9 10*3/uL (ref 0.1–1.0)
MONOS PCT: 8 %
Neutro Abs: 9.1 10*3/uL — ABNORMAL HIGH (ref 1.7–7.7)
Neutrophils Relative %: 82 %
PLATELETS: 307 10*3/uL (ref 150–400)
RBC: 4.6 MIL/uL (ref 3.87–5.11)
RDW: 13.5 % (ref 11.5–15.5)
WBC: 11.1 10*3/uL — ABNORMAL HIGH (ref 4.0–10.5)

## 2015-01-22 LAB — TROPONIN I
Troponin I: <0.03 ng/mL (ref <0.031)
Troponin I: <0.03 ng/mL (ref <0.031)

## 2015-01-22 LAB — COMPREHENSIVE METABOLIC PANEL
ALK PHOS: 73 U/L (ref 38–126)
ALT: 21 U/L (ref 14–54)
AST: 27 U/L (ref 15–41)
Albumin: 3.2 g/dL — ABNORMAL LOW (ref 3.5–5.0)
Anion gap: 9 (ref 5–15)
BUN: 11 mg/dL (ref 6–20)
CHLORIDE: 102 mmol/L (ref 101–111)
CO2: 25 mmol/L (ref 22–32)
CREATININE: 0.76 mg/dL (ref 0.44–1.00)
Calcium: 8.8 mg/dL — ABNORMAL LOW (ref 8.9–10.3)
GFR calc Af Amer: >60 mL/min (ref >60)
GFR calc non Af Amer: >60 mL/min (ref >60)
GLUCOSE: 125 mg/dL — AB (ref 65–99)
Potassium: 3.3 mmol/L — ABNORMAL LOW (ref 3.5–5.1)
SODIUM: 136 mmol/L (ref 135–145)
Total Bilirubin: 0.5 mg/dL (ref 0.3–1.2)
Total Protein: 6.5 g/dL (ref 6.5–8.1)

## 2015-01-22 LAB — URINALYSIS, ROUTINE W REFLEX MICROSCOPIC
BILIRUBIN URINE: NEGATIVE
GLUCOSE, UA: NEGATIVE mg/dL
HGB URINE DIPSTICK: NEGATIVE
KETONES UR: NEGATIVE mg/dL
LEUKOCYTES UA: NEGATIVE
Nitrite: NEGATIVE
PH: 7.5 (ref 5.0–8.0)
PROTEIN: NEGATIVE mg/dL
Specific Gravity, Urine: 1.018 (ref 1.005–1.030)
Urobilinogen, UA: 1 mg/dL (ref 0.0–1.0)

## 2015-01-22 LAB — MRSA PCR SCREENING: MRSA BY PCR: NEGATIVE

## 2015-01-22 LAB — MAGNESIUM: Magnesium: 1.9 mg/dL (ref 1.7–2.4)

## 2015-01-22 MED ORDER — PAROXETINE HCL 20 MG PO TABS
10.0000 mg | ORAL_TABLET | Freq: Every day | ORAL | Status: DC
  Administered 2015-01-22 – 2015-01-28 (×7): 10 mg via ORAL
  Filled 2015-01-22 (×8): qty 1

## 2015-01-22 MED ORDER — LIDOCAINE HCL (PF) 1 % IJ SOLN
INTRAMUSCULAR | Status: AC
  Administered 2015-01-22: 03:00:00
  Filled 2015-01-22: qty 10

## 2015-01-22 MED ORDER — CALCIUM CARBONATE 600 MG PO TABS: 1200.0000 mg | ORAL_TABLET | Freq: Every day | ORAL | Status: DC

## 2015-01-22 MED ORDER — ALPRAZOLAM 0.25 MG PO TABS
0.2500 mg | ORAL_TABLET | Freq: Every evening | ORAL | Status: DC | PRN
  Administered 2015-01-24: 0.25 mg via ORAL
  Filled 2015-01-22: qty 1

## 2015-01-22 MED ORDER — PANTOPRAZOLE SODIUM 40 MG PO TBEC
40.0000 mg | DELAYED_RELEASE_TABLET | Freq: Every day | ORAL | Status: DC
  Administered 2015-01-22: 40 mg via ORAL
  Filled 2015-01-22: qty 1

## 2015-01-22 MED ORDER — CEFAZOLIN SODIUM-DEXTROSE 2-3 GM-% IV SOLR: 2.0000 g | Freq: Once | INTRAVENOUS | Status: DC

## 2015-01-22 MED ORDER — CALCIUM CARBONATE 1250 (500 CA) MG PO TABS
1250.0000 mg | ORAL_TABLET | Freq: Every day | ORAL | Status: DC
  Administered 2015-01-22 – 2015-01-23 (×2): 1250 mg via ORAL
  Filled 2015-01-22: qty 3
  Filled 2015-01-22 (×2): qty 1

## 2015-01-22 MED ORDER — METOPROLOL TARTRATE 12.5 MG HALF TABLET
12.5000 mg | ORAL_TABLET | Freq: Two times a day (BID) | ORAL | Status: DC
  Administered 2015-01-22 – 2015-01-25 (×6): 12.5 mg via ORAL
  Filled 2015-01-22 (×8): qty 1

## 2015-01-22 MED ORDER — PYRIDOSTIGMINE BROMIDE 60 MG PO TABS
60.0000 mg | ORAL_TABLET | Freq: Three times a day (TID) | ORAL | Status: DC
  Administered 2015-01-22 – 2015-01-28 (×19): 60 mg via ORAL
  Filled 2015-01-22 (×24): qty 1

## 2015-01-22 MED ORDER — VITAMIN D3 25 MCG (1000 UNIT) PO TABS
2000.0000 [IU] | ORAL_TABLET | Freq: Every day | ORAL | Status: DC
  Administered 2015-01-22 – 2015-01-23 (×2): 2000 [IU] via ORAL
  Filled 2015-01-22 (×2): qty 2

## 2015-01-22 MED ORDER — ONDANSETRON HCL 4 MG/2ML IJ SOLN
4.0000 mg | Freq: Four times a day (QID) | INTRAMUSCULAR | Status: DC | PRN
  Administered 2015-01-22 – 2015-01-23 (×4): 4 mg via INTRAVENOUS
  Filled 2015-01-22 (×3): qty 2

## 2015-01-22 MED ORDER — CEFAZOLIN SODIUM-DEXTROSE 2-3 GM-% IV SOLR
2.0000 g | Freq: Once | INTRAVENOUS | Status: AC
  Administered 2015-01-22: 2 g via INTRAVENOUS
  Filled 2015-01-22: qty 50

## 2015-01-22 MED ORDER — METOPROLOL TARTRATE 25 MG PO TABS
25.0000 mg | ORAL_TABLET | Freq: Two times a day (BID) | ORAL | Status: DC
  Administered 2015-01-22: 25 mg via ORAL
  Filled 2015-01-22: qty 1

## 2015-01-22 NOTE — Consult Note (Signed)
[facility] Medical Consultation  Kijana S Lahaie ZOX:[id number] DOB: 16-Oct-1945 DOA: 01/21/2015 PCP: Kathlene November, MD   Patient was admitted this a.m. and seen by [facility]. Stopped in to ensure patient did not require further diagnostic/therapeutic modalities. Patient was A/O 4, NAD. See workup from [facility] ([facility])   Past Medical History  Diagnosis Date  . Insomnia   . Adenomatous polyp of colon   . Overweight(278.02)   . Symptomatic menopausal or female climacteric states   . Hyperlipidemia   . Osteoarthrosis, unspecified whether generalized or localized, unspecified site   . Anxiety state, unspecified   . Hypertension   . Syncope and collapse     11/12: Holter 12/12 with rare PACS, HR range 64-140, average 82, no significant arrhythmias. Echo (1/13): EF 50-55%, mild LVH, septal-lateral dyssynchrony c/w LBBB. 3-week event monitor (1/13): No significant arrhythmia.   Marland Kitchen LBBB (left bundle branch block)     LHC in 2002 showed normal coronaries.    Past Surgical History  Procedure Laterality Date  . Tubal ligation     Social History:  reports that she has never smoked. She has never used smokeless tobacco. She reports that she does not drink alcohol or use illicit drugs.  No Known Allergies Family History  Problem Relation Age of Onset  . Heart failure Mother   . Coronary artery disease Mother   . Dementia Mother   . Diabetes Mother   . Colon cancer Neg Hx   . Breast cancer Neg Hx   . Hypertension Neg Hx   . Heart disease Neg Hx   . Heart attack Neg Hx   . Stroke Neg Hx     Prior to Admission medications   Medication Sig Start Date End Date Taking? Authorizing Provider  ALPRAZolam (XANAX) 0.25 MG tablet Take 1-2 tablets (0.25-0.5 mg total) by mouth at bedtime as needed for sleep. 11/20/14  Yes Colon Branch, MD  calcium carbonate (OS-CAL) 600 MG TABS tablet Take 1,200 mg by mouth daily with breakfast.   Yes Historical Provider, MD   cholecalciferol (VITAMIN D) 1000 UNITS tablet Take 2,000 Units by mouth daily.    Yes Historical Provider, MD  metoprolol tartrate (LOPRESSOR) 25 MG tablet Take 1 tablet (25 mg total) by mouth 2 (two) times daily. 01/17/15  Yes Larey Dresser, MD  PARoxetine (PAXIL) 10 MG tablet Take 10 mg by mouth daily.   Yes Historical Provider, MD  pyridostigmine (MESTINON) 60 MG tablet Take 1 tablet (60 mg total) by mouth 3 (three) times daily. 09/20/14  Yes Larey Dresser, MD  hydrocortisone 2.5 % cream Apply topically 2 (two) times daily. Patient not taking: Reported on 01/21/2015 09/07/14   Colon Branch, MD   Physical Exam: Blood pressure 125/63, pulse 90, temperature 98.6 F (37 C), temperature source Oral, resp. rate 22, height 5' 5.5" (1.664 m), weight 51.71 kg (114 lb), SpO2 100 %. Filed Vitals:   01/22/15 0336 01/22/15 0544 01/22/15 0625 01/22/15 0700  BP: 143/64  125/63   Pulse:  90 90   Temp: 98 F (36.7 C)   98.6 F (37 C)  TempSrc: Oral   Oral  Resp:  14 22   Height: 5' 5.5" (1.664 m)     Weight: 51.71 kg (114 lb)     SpO2:  97% 100%      General:    Eyes:   ENT:   Neck:   Cardiovascular:   Respiratory:   Abdomen:  Skin:   Musculoskeletal:   Psychiatric:   Neurologic:   Labs on Admission:  Basic Metabolic Panel:  Recent Labs Lab 01/21/15 2038  NA 138  K 4.0  CL 105  CO2 27  GLUCOSE 150*  BUN 13  CREATININE 0.70  CALCIUM 8.8*   Liver Function Tests: No results for input(s): AST, ALT, ALKPHOS, BILITOT, PROT, ALBUMIN in the last 168 hours. No results for input(s): LIPASE, AMYLASE in the last 168 hours. No results for input(s): AMMONIA in the last 168 hours. CBC:  Recent Labs Lab 01/21/15 2038  WBC 13.2*  HGB 12.8  HCT 39.0  MCV 85.2  PLT 304   Cardiac Enzymes:  Recent Labs Lab 01/22/15 0441  TROPONINI <0.03   BNP: Invalid input(s): POCBNP CBG:  Recent Labs Lab 01/21/15 1954  GLUCAP 141*    Radiological Exams on Admission: Ct  Angio Head W/cm &/or Wo Cm  01/21/2015  CLINICAL DATA:  Syncopal episode in kitchen, unwitnessed fall. Struck back of head. Subarachnoid hemorrhage, assess for aneurysm. EXAM: CT ANGIOGRAPHY HEAD TECHNIQUE: Multidetector CT imaging of the head was performed using the standard protocol during bolus administration of intravenous contrast. Multiplanar CT image reconstructions and MIPs were obtained to evaluate the vascular anatomy. CONTRAST:  41mL OMNIPAQUE IOHEXOL 350 MG/ML SOLN COMPARISON:  CT head January 21, 2015 at 2145 hours FINDINGS: Anterior circulation: Normal appearance of the cervical internal carotid arteries, petrous, cavernous and supra clinoid internal carotid arteries. Widely patent anterior communicating artery. RIGHT A1 is dominant. Widely patent anterior and middle cerebral arteries. Mild luminal irregularity of the RIGHT M2 and M3 segments. Posterior circulation: LEFT vertebral artery is dominant with normal appearance of the vertebral arteries, vertebrobasilar junction and basilar artery, as well as main branch vessels. RIGHT vertebral artery terminates in the posterior inferior cerebellar artery. Normal appearance of the posterior cerebral arteries. No large vessel occlusion, hemodynamically significant stenosis, dissection, contrast extravasation or aneurysm within the anterior nor posterior circulation. Delayed phase demonstrates 3 mm RIGHT holo hemispheric subdural hematoma, evolving RIGHT inferior frontal lobe hemorrhagic contusion a moderate amount of subarachnoid hemorrhage predominately in the RIGHT sylvian fissure. 4 mm RIGHT to LEFT midline shift. IMPRESSION: No aneurysm. No acute large vessel occlusion or high-grade stenosis. Mild luminal irregularity of the RIGHT M2 segments, likely representing focal vasospasm in the presence of subarachnoid hemorrhage. 3 mm RIGHT acute subdural hematoma with moderate subarachnoid hemorrhage predominately in the sylvian fissure and, evolving RIGHT  inferior frontal lobe hemorrhagic contusion. Electronically Signed   By: Elon Alas M.D.   On: 01/21/2015 23:59   Dg Chest 2 View  01/21/2015  CLINICAL DATA:  Syncopal episode at home tonight.  Hit head. EXAM: CHEST  2 VIEW COMPARISON:  None. FINDINGS: The cardiac silhouette, mediastinal and hilar contours are within normal limits. The lungs are clear. Mild biapical pleural thickening. The bony thorax is intact. IMPRESSION: No acute cardiopulmonary findings. Electronically Signed   By: Marijo Sanes M.D.   On: 01/21/2015 22:26   Ct Head Wo Contrast  01/21/2015  CLINICAL DATA:  Fall in kitchen with occipital head injury. Loss of consciousness before fall. Initial encounter. EXAM: CT HEAD WITHOUT CONTRAST CT CERVICAL SPINE WITHOUT CONTRAST TECHNIQUE: Multidetector CT imaging of the head and cervical spine was performed following the standard protocol without intravenous contrast. Multiplanar CT image reconstructions of the cervical spine were also generated. COMPARISON:  None. FINDINGS: CT HEAD FINDINGS Skull and Sinuses:There is a left occipital scalp laceration and contusion with nondepressed oblique fracture through  the occipital bone. Clear sinuses and mastoids. Orbits: No traumatic findings Brain: Moderate subarachnoid hemorrhage in and around the right sylvian fissure. Given the history, this is presumably traumatic. If there are clinical features concerning for primary aneurysmal hemorrhage, CTA could be obtained. There is a small subdural hematoma around the right cerebral convexity, measuring up to 4 mm thickness. Inferior right frontal parenchymal hemorrhage measuring 7 mm, with surrounding edema. Local mass effect without midline shift or herniation. No pre-existing infarct noted. No hydrocephalus or visible mass lesion. CT CERVICAL SPINE FINDINGS Negative for acute fracture or subluxation. No prevertebral edema. No gross cervical canal hematoma. Lower cervical degenerative disc narrowing with  reversal cervical lordosis. Uncovertebral spurs at C5-6 and C6-7 cause mild foraminal narrowing. Critical Value/emergent results were called by telephone at the time of interpretation on 01/21/2015 at 10:31 pm to Dr. Lonia Skinner , who verbally acknowledged these results. IMPRESSION: 1. Moderate subarachnoid hemorrhage around the right sylvian fissure. 2. Inferior right frontal hemorrhagic contusion with mild local mass effect. 3. Thin subdural hematoma around the right cerebral convexity. 4. Nondepressed occipital bone fracture. 5. No evidence of cervical spine injury. Electronically Signed   By: Monte Fantasia M.D.   On: 01/21/2015 22:33   Ct Cervical Spine Wo Contrast  01/21/2015  CLINICAL DATA:  Fall in kitchen with occipital head injury. Loss of consciousness before fall. Initial encounter. EXAM: CT HEAD WITHOUT CONTRAST CT CERVICAL SPINE WITHOUT CONTRAST TECHNIQUE: Multidetector CT imaging of the head and cervical spine was performed following the standard protocol without intravenous contrast. Multiplanar CT image reconstructions of the cervical spine were also generated. COMPARISON:  None. FINDINGS: CT HEAD FINDINGS Skull and Sinuses:There is a left occipital scalp laceration and contusion with nondepressed oblique fracture through the occipital bone. Clear sinuses and mastoids. Orbits: No traumatic findings Brain: Moderate subarachnoid hemorrhage in and around the right sylvian fissure. Given the history, this is presumably traumatic. If there are clinical features concerning for primary aneurysmal hemorrhage, CTA could be obtained. There is a small subdural hematoma around the right cerebral convexity, measuring up to 4 mm thickness. Inferior right frontal parenchymal hemorrhage measuring 7 mm, with surrounding edema. Local mass effect without midline shift or herniation. No pre-existing infarct noted. No hydrocephalus or visible mass lesion. CT CERVICAL SPINE FINDINGS Negative for acute fracture or  subluxation. No prevertebral edema. No gross cervical canal hematoma. Lower cervical degenerative disc narrowing with reversal cervical lordosis. Uncovertebral spurs at C5-6 and C6-7 cause mild foraminal narrowing. Critical Value/emergent results were called by telephone at the time of interpretation on 01/21/2015 at 10:31 pm to Dr. Lonia Skinner , who verbally acknowledged these results. IMPRESSION: 1. Moderate subarachnoid hemorrhage around the right sylvian fissure. 2. Inferior right frontal hemorrhagic contusion with mild local mass effect. 3. Thin subdural hematoma around the right cerebral convexity. 4. Nondepressed occipital bone fracture. 5. No evidence of cervical spine injury. Electronically Signed   By: Monte Fantasia M.D.   On: 01/21/2015 22:33    EKG: Independently reviewed.   Care during the described time interval was provided by me .  I have reviewed this patient's available data, including medical history, events of note, physical examination, and all test results as part of my evaluation. I have personally reviewed and interpreted all radiology studies.  Time spent: 10 minutes  Allie Bossier [facility] Pager [phone]  If 7PM-7AM, please contact night-coverage [email or web address] Password [email or web address] 01/22/2015, 9:20 AM

## 2015-01-22 NOTE — Progress Notes (Signed)
Patient became nauseated and vomited during orthostatic vitals.  Dr. Saintclair Halsted and Dr. Sherral Hammers updated on patients condition.  Received an order for Zofran.  Will continue to monitor patient.

## 2015-01-22 NOTE — Consult Note (Signed)
PCP:  Kathlene November, MD  Cardiology [facility]  Referring provider Saintclair Halsted   Reason for consult:  syncope Assessment/Plan 69 year old female with history of orthostatic hypotension admitted for now a syncopal event  Present on Admission:  . Syncope and collapse - patient has had similar events in the past. Has been worked up by cardiology including Holter monitor, echo gram, left  heart cauterization this is thought to be secondary to orthostatic hypotension and eventually has improved on Mestinon. Given prolonged episode of unconsciousness will reevaluate will cycle cardiac enzymes obtain repeat echo gram and carotid Dopplers. Would attempt to check orthostatics if able to tolerate once patient is more stable. Would consider close follow-up with cardiology after discharge.  . Orthostatic hypotension - we'll continue gentle hydration  . Essential hypertension, benign we will continue home medications   HPI: Mandy Durham is a 69 y.o. female   has a past medical history of Insomnia; Adenomatous polyp of colon; Overweight(278.02); Symptomatic menopausal or female climacteric states; Hyperlipidemia; Osteoarthrosis, unspecified whether generalized or localized, unspecified site; Anxiety state, unspecified; Hypertension; Syncope and collapse; and LBBB (left bundle branch block).   Presented with a fall in the kitchen. Her husband heard her fall and found her unconscious. She was standing at this sink for about 3 min and felt lightheaded. She had a chace to call out his name prior to hitting the floor. Patient fell backwards hitting her head. She has hx of orthostatic hypotension in the past. During her prior episodes of syncope this sometimes have occurred after prolonged standing. Patient in the past was treated with Florinef but failed therapy She was started on Mestinon 1 year ago and did well for a while although she still had occasional episodes of lightheadedness. Patient has been followed for this by  cardiology she has chronic history of left bundle-branch block since 2000 last echogram was done 2014 showing EF of 50-she's to 5% with mild LVH and septal lateral dyssynchrony with the bundle-branch block and no significant arrhythmia noted on Holter monitor. Per cardiology they're worried about autonomic insufficiency.  She reports good Po intake. Denies any shortness of breath no chest pain no leg swelling.  Patient arrived to ER by ambulance. Patient was unconcious for 5 min then slowly came to it but was not back to baseline for a while. On Arrival to ER she was found to have 3 mm subarachnoid hemorrhage.    Hospitalist was called for Consult for Syncope  Review of Systems:    Pertinent positives include: syncope  Constitutional:  No weight loss, night sweats, Fevers, chills, fatigue, weight loss  HEENT:  No headaches, Difficulty swallowing,Tooth/dental problems,Sore throat,  No sneezing, itching, ear ache, nasal congestion, post nasal drip,  Cardio-vascular:  No chest pain, Orthopnea, PND, anasarca, dizziness, palpitations.no Bilateral lower extremity swelling  GI:  No heartburn, indigestion, abdominal pain, nausea, vomiting, diarrhea, change in bowel habits, loss of appetite, melena, blood in stool, hematemesis Resp:  no shortness of breath at rest. No dyspnea on exertion, No excess mucus, no productive cough, No non-productive cough, No coughing up of blood.No change in color of mucus.No wheezing. Skin:  no rash or lesions. No jaundice GU:  no dysuria, change in color of urine, no urgency or frequency. No straining to urinate.  No flank pain.  Musculoskeletal:  No joint pain or no joint swelling. No decreased range of motion. No back pain.  Psych:  No change in mood or affect. No depression or anxiety. No memory loss.  Neuro: no localizing neurological complaints, no tingling, no weakness, no double vision, no gait abnormality, no slurred speech, no confusion  Otherwise ROS  are negative except for above, 10 systems were reviewed  Past Medical History: Past Medical History  Diagnosis Date  . Insomnia   . Adenomatous polyp of colon   . Overweight(278.02)   . Symptomatic menopausal or female climacteric states   . Hyperlipidemia   . Osteoarthrosis, unspecified whether generalized or localized, unspecified site   . Anxiety state, unspecified   . Hypertension   . Syncope and collapse     11/12: Holter 12/12 with rare PACS, HR range 64-140, average 82, no significant arrhythmias. Echo (1/13): EF 50-55%, mild LVH, septal-lateral dyssynchrony c/w LBBB. 3-week event monitor (1/13): No significant arrhythmia.   Marland Kitchen LBBB (left bundle branch block)     LHC in 2002 showed normal coronaries.    Past Surgical History  Procedure Laterality Date  . Tubal ligation       Medications: Prior to Admission medications   Medication Sig Start Date End Date Taking? Authorizing Provider  ALPRAZolam (XANAX) 0.25 MG tablet Take 1-2 tablets (0.25-0.5 mg total) by mouth at bedtime as needed for sleep. 11/20/14  Yes Colon Branch, MD  calcium carbonate (OS-CAL) 600 MG TABS tablet Take 1,200 mg by mouth daily with breakfast.   Yes Historical Provider, MD  cholecalciferol (VITAMIN D) 1000 UNITS tablet Take 2,000 Units by mouth daily.    Yes Historical Provider, MD  metoprolol tartrate (LOPRESSOR) 25 MG tablet Take 1 tablet (25 mg total) by mouth 2 (two) times daily. 01/17/15  Yes Larey Dresser, MD  PARoxetine (PAXIL) 10 MG tablet Take 10 mg by mouth daily.   Yes Historical Provider, MD  pyridostigmine (MESTINON) 60 MG tablet Take 1 tablet (60 mg total) by mouth 3 (three) times daily. 09/20/14  Yes Larey Dresser, MD  hydrocortisone 2.5 % cream Apply topically 2 (two) times daily. Patient not taking: Reported on 01/21/2015 09/07/14   Colon Branch, MD    Allergies:  No Known Allergies  Social History:  Ambulatory   independently   Lives at home   With family     reports that she has  never smoked. She has never used smokeless tobacco. She reports that she does not drink alcohol or use illicit drugs.    Family History: family history includes Coronary artery disease in her mother; Dementia in her mother; Diabetes in her mother; Heart failure in her mother. There is no history of Colon cancer, Breast cancer, Hypertension, Heart disease, Heart attack, or Stroke.    Physical Exam: Patient Vitals for the past 24 hrs:  BP Temp Temp src Pulse Resp SpO2 Height Weight  01/21/15 2230 154/64 mmHg - - 71 17 93 % - -  01/21/15 2130 (!) 132/49 mmHg - - 73 15 92 % - -  01/21/15 2115 153/75 mmHg - - 73 13 93 % - -  01/21/15 2100 158/59 mmHg - - 78 17 98 % - -  01/21/15 2045 172/78 mmHg - - 69 17 96 % - -  01/21/15 2030 170/82 mmHg - - 72 22 94 % - -  01/21/15 2015 170/86 mmHg - - 69 19 91 % - -  01/21/15 2000 175/86 mmHg - - 72 20 96 % - -  01/21/15 1945 164/72 mmHg - - 65 17 93 % - -  01/21/15 1944 - - - 67 18 94 % - -  01/21/15 1943 - - -  69 15 96 % - -  01/21/15 1942 - - - 67 15 95 % - -  01/21/15 1941 - - - 68 23 96 % - -  01/21/15 1940 164/71 mmHg - - 68 17 94 % - -  01/21/15 1930 164/71 mmHg 97.5 F (36.4 C) Oral 69 19 94 % 5' 6.5" (1.689 m) 51.71 kg (114 lb)  01/21/15 1929 - - - - - 98 % - -    1. General:  in No Acute distress 2. Psychological: Alert and   Oriented 3. Head/ENT:    Dry Mucous Membranes                          Head Non traumatic, neck supple                          Normal Dentition 4. SKIN: normal   Skin turgor,  Skin clean Dry and intact no rash 5. Heart: Regular rate and rhythm, systolic Murmur present, Rub or gallop 6. Lungs: Clear to auscultation bilaterally, no wheezes or crackles   7. Abdomen: Soft, non-tender, Non distended 8. Lower extremities: no clubbing, cyanosis, or edema 9. Neurologically normal strength 5/5 in all 4 extremities, CN 2-12 intact 10. MSK: Normal range of motion  body mass index is 18.13 kg/(m^2).   Labs on  Admission:   Results for orders placed or performed during the hospital encounter of 01/21/15 (from the past 24 hour(s))  CBG monitoring, ED     Status: Abnormal   Collection Time: 01/21/15  7:54 PM  Result Value Ref Range   Glucose-Capillary 141 (H) 65 - 99 mg/dL  Basic metabolic panel     Status: Abnormal   Collection Time: 01/21/15  8:38 PM  Result Value Ref Range   Sodium 138 135 - 145 mmol/L   Potassium 4.0 3.5 - 5.1 mmol/L   Chloride 105 101 - 111 mmol/L   CO2 27 22 - 32 mmol/L   Glucose, Bld 150 (H) 65 - 99 mg/dL   BUN 13 6 - 20 mg/dL   Creatinine, Ser 0.70 0.44 - 1.00 mg/dL   Calcium 8.8 (L) 8.9 - 10.3 mg/dL   GFR calc non Af Amer >60 >60 mL/min   GFR calc Af Amer >60 >60 mL/min   Anion gap 6 5 - 15  CBC     Status: Abnormal   Collection Time: 01/21/15  8:38 PM  Result Value Ref Range   WBC 13.2 (H) 4.0 - 10.5 K/uL   RBC 4.58 3.87 - 5.11 MIL/uL   Hemoglobin 12.8 12.0 - 15.0 g/dL   HCT 39.0 36.0 - 46.0 %   MCV 85.2 78.0 - 100.0 fL   MCH 27.9 26.0 - 34.0 pg   MCHC 32.8 30.0 - 36.0 g/dL   RDW 13.4 11.5 - 15.5 %   Platelets 304 150 - 400 K/uL  I-Stat Troponin, ED (not at [facility])     Status: None   Collection Time: 01/21/15  8:44 PM  Result Value Ref Range   Troponin i, poc 0.00 0.00 - 0.08 ng/mL   Comment 3          Urinalysis, Routine w reflex microscopic (not at [facility])     Status: Abnormal   Collection Time: 01/21/15 11:48 PM  Result Value Ref Range   Color, Urine YELLOW YELLOW   APPearance HAZY (A) CLEAR   Specific Gravity, Urine 1.018 1.005 -  1.030   pH 7.5 5.0 - 8.0   Glucose, UA NEGATIVE NEGATIVE mg/dL   Hgb urine dipstick NEGATIVE NEGATIVE   Bilirubin Urine NEGATIVE NEGATIVE   Ketones, ur NEGATIVE NEGATIVE mg/dL   Protein, ur NEGATIVE NEGATIVE mg/dL   Urobilinogen, UA 1.0 0.0 - 1.0 mg/dL   Nitrite NEGATIVE NEGATIVE   Leukocytes, UA NEGATIVE NEGATIVE    UA no evidence of UTI  Lab Results  Component Value Date   HGBA1C 6.1 09/11/2014    Estimated  Creatinine Clearance: 53.4 mL/min (by C-G formula based on Cr of 0.7).  BNP (last 3 results) No results for input(s): PROBNP in the last 8760 hours.  Other results:  I have pearsonaly reviewed this: ECG REPORT  Rate: 78  Rhythm: LBBB ST&T Change: n/a   Filed Weights   01/21/15 1930  Weight: 51.71 kg (114 lb)     Cultures: No results found for: SDES, SPECREQUEST, CULT, REPTSTATUS   Radiological Exams on Admission: Ct Angio Head W/cm &/or Wo Cm  01/21/2015  CLINICAL DATA:  Syncopal episode in kitchen, unwitnessed fall. Struck back of head. Subarachnoid hemorrhage, assess for aneurysm. EXAM: CT ANGIOGRAPHY HEAD TECHNIQUE: Multidetector CT imaging of the head was performed using the standard protocol during bolus administration of intravenous contrast. Multiplanar CT image reconstructions and MIPs were obtained to evaluate the vascular anatomy. CONTRAST:  68mL OMNIPAQUE IOHEXOL 350 MG/ML SOLN COMPARISON:  CT head January 21, 2015 at 2145 hours FINDINGS: Anterior circulation: Normal appearance of the cervical internal carotid arteries, petrous, cavernous and supra clinoid internal carotid arteries. Widely patent anterior communicating artery. RIGHT A1 is dominant. Widely patent anterior and middle cerebral arteries. Mild luminal irregularity of the RIGHT M2 and M3 segments. Posterior circulation: LEFT vertebral artery is dominant with normal appearance of the vertebral arteries, vertebrobasilar junction and basilar artery, as well as main branch vessels. RIGHT vertebral artery terminates in the posterior inferior cerebellar artery. Normal appearance of the posterior cerebral arteries. No large vessel occlusion, hemodynamically significant stenosis, dissection, contrast extravasation or aneurysm within the anterior nor posterior circulation. Delayed phase demonstrates 3 mm RIGHT holo hemispheric subdural hematoma, evolving RIGHT inferior frontal lobe hemorrhagic contusion a moderate amount of  subarachnoid hemorrhage predominately in the RIGHT sylvian fissure. 4 mm RIGHT to LEFT midline shift. IMPRESSION: No aneurysm. No acute large vessel occlusion or high-grade stenosis. Mild luminal irregularity of the RIGHT M2 segments, likely representing focal vasospasm in the presence of subarachnoid hemorrhage. 3 mm RIGHT acute subdural hematoma with moderate subarachnoid hemorrhage predominately in the sylvian fissure and, evolving RIGHT inferior frontal lobe hemorrhagic contusion. Electronically Signed   By: Elon Alas M.D.   On: 01/21/2015 23:59   Dg Chest 2 View  01/21/2015  CLINICAL DATA:  Syncopal episode at home tonight.  Hit head. EXAM: CHEST  2 VIEW COMPARISON:  None. FINDINGS: The cardiac silhouette, mediastinal and hilar contours are within normal limits. The lungs are clear. Mild biapical pleural thickening. The bony thorax is intact. IMPRESSION: No acute cardiopulmonary findings. Electronically Signed   By: Marijo Sanes M.D.   On: 01/21/2015 22:26   Ct Head Wo Contrast  01/21/2015  CLINICAL DATA:  Fall in kitchen with occipital head injury. Loss of consciousness before fall. Initial encounter. EXAM: CT HEAD WITHOUT CONTRAST CT CERVICAL SPINE WITHOUT CONTRAST TECHNIQUE: Multidetector CT imaging of the head and cervical spine was performed following the standard protocol without intravenous contrast. Multiplanar CT image reconstructions of the cervical spine were also generated. COMPARISON:  None. FINDINGS: CT  HEAD FINDINGS Skull and Sinuses:There is a left occipital scalp laceration and contusion with nondepressed oblique fracture through the occipital bone. Clear sinuses and mastoids. Orbits: No traumatic findings Brain: Moderate subarachnoid hemorrhage in and around the right sylvian fissure. Given the history, this is presumably traumatic. If there are clinical features concerning for primary aneurysmal hemorrhage, CTA could be obtained. There is a small subdural hematoma around the  right cerebral convexity, measuring up to 4 mm thickness. Inferior right frontal parenchymal hemorrhage measuring 7 mm, with surrounding edema. Local mass effect without midline shift or herniation. No pre-existing infarct noted. No hydrocephalus or visible mass lesion. CT CERVICAL SPINE FINDINGS Negative for acute fracture or subluxation. No prevertebral edema. No gross cervical canal hematoma. Lower cervical degenerative disc narrowing with reversal cervical lordosis. Uncovertebral spurs at C5-6 and C6-7 cause mild foraminal narrowing. Critical Value/emergent results were called by telephone at the time of interpretation on 01/21/2015 at 10:31 pm to Dr. Lonia Skinner , who verbally acknowledged these results. IMPRESSION: 1. Moderate subarachnoid hemorrhage around the right sylvian fissure. 2. Inferior right frontal hemorrhagic contusion with mild local mass effect. 3. Thin subdural hematoma around the right cerebral convexity. 4. Nondepressed occipital bone fracture. 5. No evidence of cervical spine injury. Electronically Signed   By: Monte Fantasia M.D.   On: 01/21/2015 22:33   Ct Cervical Spine Wo Contrast  01/21/2015  CLINICAL DATA:  Fall in kitchen with occipital head injury. Loss of consciousness before fall. Initial encounter. EXAM: CT HEAD WITHOUT CONTRAST CT CERVICAL SPINE WITHOUT CONTRAST TECHNIQUE: Multidetector CT imaging of the head and cervical spine was performed following the standard protocol without intravenous contrast. Multiplanar CT image reconstructions of the cervical spine were also generated. COMPARISON:  None. FINDINGS: CT HEAD FINDINGS Skull and Sinuses:There is a left occipital scalp laceration and contusion with nondepressed oblique fracture through the occipital bone. Clear sinuses and mastoids. Orbits: No traumatic findings Brain: Moderate subarachnoid hemorrhage in and around the right sylvian fissure. Given the history, this is presumably traumatic. If there are clinical features  concerning for primary aneurysmal hemorrhage, CTA could be obtained. There is a small subdural hematoma around the right cerebral convexity, measuring up to 4 mm thickness. Inferior right frontal parenchymal hemorrhage measuring 7 mm, with surrounding edema. Local mass effect without midline shift or herniation. No pre-existing infarct noted. No hydrocephalus or visible mass lesion. CT CERVICAL SPINE FINDINGS Negative for acute fracture or subluxation. No prevertebral edema. No gross cervical canal hematoma. Lower cervical degenerative disc narrowing with reversal cervical lordosis. Uncovertebral spurs at C5-6 and C6-7 cause mild foraminal narrowing. Critical Value/emergent results were called by telephone at the time of interpretation on 01/21/2015 at 10:31 pm to Dr. Lonia Skinner , who verbally acknowledged these results. IMPRESSION: 1. Moderate subarachnoid hemorrhage around the right sylvian fissure. 2. Inferior right frontal hemorrhagic contusion with mild local mass effect. 3. Thin subdural hematoma around the right cerebral convexity. 4. Nondepressed occipital bone fracture. 5. No evidence of cervical spine injury. Electronically Signed   By: Monte Fantasia M.D.   On: 01/21/2015 22:33    Chart has been reviewed  Family  at  Bedside  plan of care was discussed with   Husband Mandy Durham, [phone]     Prophylaxis: SCD   CODE STATUS:  FULL CODE as per patient    Disposition:   To home once workup is complete and patient is stable  Other plan as per orders.  I have spent a total of  55 min on this admission  Dashiell Franchino 01/22/2015, 12:56 AM  [facility]  Pager [phone]   after 2 AM please page floor coverage PA If 7AM-7PM, please contact the day team taking care of the patient  [email or web address]  Password [email or web address]

## 2015-01-22 NOTE — Progress Notes (Signed)
  Echocardiogram 2D Echocardiogram has been performed.  Donata Clay 01/22/2015, 2:06 PM

## 2015-01-22 NOTE — Progress Notes (Signed)
VASCULAR LAB PRELIMINARY  PRELIMINARY  PRELIMINARY  PRELIMINARY  Carotid duplex  completed.    Preliminary report:  Bilateral:  1-39% ICA stenosis.  Vertebral artery flow is antegrade.      Ithan Touhey, RVT 01/22/2015, 2:37 PM

## 2015-01-22 NOTE — ED Provider Notes (Signed)
CSN: [id number]     Arrival date & time 01/21/15  1923 History   First MD Initiated Contact with Patient 01/21/15 2015     Chief Complaint  Patient presents with  . Loss of Consciousness     (Consider location/radiation/quality/duration/timing/severity/associated sxs/prior Treatment) HPI Comments: 69 y.o. Female with history of HTN, hyperlipidemia, LBBB presents for syncope.  The patient reportedly was in her normal health today other than an episode at the store during which she said that she could not talk to a woman in [place] because it felt like her tongue was swollen but this sensation resolved.  Tonight she was in the kitchen putting away strawberries when she called out to her husband who was in the room and then seemed to pass out and fall backwards on to the floor.  Husband reports the patient was unresponsive for about 8 minutes.  Patient has been very nauseous since the incident and says she does not remember anything about what happened.  She reports a headache with nausea and did vomit twice in route to the hospital.     Past Medical History  Diagnosis Date  . Insomnia   . Adenomatous polyp of colon   . Overweight(278.02)   . Symptomatic menopausal or female climacteric states   . Hyperlipidemia   . Osteoarthrosis, unspecified whether generalized or localized, unspecified site   . Anxiety state, unspecified   . Hypertension   . Syncope and collapse     11/12: Holter 12/12 with rare PACS, HR range 64-140, average 82, no significant arrhythmias. Echo (1/13): EF 50-55%, mild LVH, septal-lateral dyssynchrony c/w LBBB. 3-week event monitor (1/13): No significant arrhythmia.   Marland Kitchen LBBB (left bundle branch block)     LHC in 2002 showed normal coronaries.    Past Surgical History  Procedure Laterality Date  . Tubal ligation     Family History  Problem Relation Age of Onset  . Heart failure Mother   . Coronary artery disease Mother   . Dementia Mother   . Diabetes Mother   .  Colon cancer Neg Hx   . Breast cancer Neg Hx   . Hypertension Neg Hx   . Heart disease Neg Hx   . Heart attack Neg Hx   . Stroke Neg Hx    Social History  Substance Use Topics  . Smoking status: Never Smoker   . Smokeless tobacco: Never Used  . Alcohol Use: No   OB History    No data available     Review of Systems  Constitutional: Negative for fever, chills, appetite change and fatigue.  HENT: Negative for congestion, postnasal drip and rhinorrhea.   Eyes: Negative for pain and visual disturbance.  Cardiovascular: Negative for chest pain and palpitations.  Gastrointestinal: Negative for nausea, vomiting, abdominal pain and diarrhea.  Genitourinary: Negative for dysuria, urgency and hematuria.  Musculoskeletal: Negative for myalgias and back pain.  Skin: Negative for rash.  Neurological: Positive for syncope, speech difficulty (transiet, resolved) and headaches. Negative for dizziness and weakness.  Hematological: Does not bruise/bleed easily.      Allergies  Review of patient's allergies indicates no known allergies.  Home Medications   Prior to Admission medications   Medication Sig Start Date End Date Taking? Authorizing Provider  ALPRAZolam (XANAX) 0.25 MG tablet Take 1-2 tablets (0.25-0.5 mg total) by mouth at bedtime as needed for sleep. 11/20/14  Yes Colon Branch, MD  calcium carbonate (OS-CAL) 600 MG TABS tablet Take 1,200 mg by mouth  daily with breakfast.   Yes Historical Provider, MD  cholecalciferol (VITAMIN D) 1000 UNITS tablet Take 2,000 Units by mouth daily.    Yes Historical Provider, MD  metoprolol tartrate (LOPRESSOR) 25 MG tablet Take 1 tablet (25 mg total) by mouth 2 (two) times daily. 01/17/15  Yes Larey Dresser, MD  PARoxetine (PAXIL) 10 MG tablet Take 10 mg by mouth daily.   Yes Historical Provider, MD  pyridostigmine (MESTINON) 60 MG tablet Take 1 tablet (60 mg total) by mouth 3 (three) times daily. 09/20/14  Yes Larey Dresser, MD  hydrocortisone 2.5 %  cream Apply topically 2 (two) times daily. Patient not taking: Reported on 01/21/2015 09/07/14   Colon Branch, MD   BP 130/65 mmHg  Pulse 93  Temp(Src) 97.5 F (36.4 C) (Oral)  Resp 20  Ht 5' 6.5" (1.689 m)  Wt 114 lb (51.71 kg)  BMI 18.13 kg/m2  SpO2 95% Physical Exam  Constitutional: She is oriented to person, place, and time. She appears well-developed and well-nourished. She appears distressed (in obvious discomfort).  HENT:  Head: Normocephalic. Head is with laceration (2 cm scalp laceration with skull exposure at the base).    Right Ear: External ear normal.  Left Ear: External ear normal.  Nose: Nose normal.  Mouth/Throat: Oropharynx is clear and moist. No oropharyngeal exudate.  Eyes: EOM are normal. Pupils are equal, round, and reactive to light.  Neck: Normal range of motion. Neck supple.  Cardiovascular: Normal rate, regular rhythm, normal heart sounds and intact distal pulses.   No murmur heard. Pulmonary/Chest: Effort normal. No respiratory distress. She has no wheezes. She has no rales.  Abdominal: Soft. She exhibits no distension. There is no tenderness.  Musculoskeletal: Normal range of motion. She exhibits no edema or tenderness.  Neurological: She is alert and oriented to person, place, and time. She exhibits normal muscle tone.  Skin: Skin is warm and dry. No rash noted. She is not diaphoretic.  Vitals reviewed.   ED Course  .Marland KitchenLaceration Repair Date/Time: 01/22/2015 12:16 AM Performed by: Lonia Skinner ROE Authorized by: Harvel Quale Consent: Verbal consent obtained. Risks and benefits: risks, benefits and alternatives were discussed Consent given by: patient Patient understanding: patient states understanding of the procedure being performed Required items: required blood products, implants, devices, and special equipment available Patient identity confirmed: verbally with patient Body area: head/neck Location details: scalp Laceration length: 2  cm Foreign bodies: no foreign bodies Tendon involvement: none Nerve involvement: none Vascular damage: no Anesthesia: local infiltration Local anesthetic: lidocaine 1% without epinephrine Patient sedated: no Preparation: Patient was prepped and draped in the usual sterile fashion. Irrigation solution: saline Amount of cleaning: standard Debridement: none Skin closure: 5-0 Prolene Fascia closure: 5-0 Vicryl Number of sutures: 7 Technique: simple Approximation: close Approximation difficulty: simple Patient tolerance: Patient tolerated the procedure well with no immediate complications   (including critical care time) Labs Review Labs Reviewed  BASIC METABOLIC PANEL - Abnormal; Notable for the following:    Glucose, Bld 150 (*)    Calcium 8.8 (*)    All other components within normal limits  CBC - Abnormal; Notable for the following:    WBC 13.2 (*)    All other components within normal limits  URINALYSIS, ROUTINE W REFLEX MICROSCOPIC (NOT AT [facility]) - Abnormal; Notable for the following:    APPearance HAZY (*)    All other components within normal limits  CBG MONITORING, ED - Abnormal; Notable for the following:  Glucose-Capillary 141 (*)    All other components within normal limits  MRSA PCR SCREENING  TROPONIN I  TROPONIN I  TROPONIN I  I-STAT TROPOININ, ED    Imaging Review Ct Angio Head W/cm &/or Wo Cm  01/21/2015  CLINICAL DATA:  Syncopal episode in kitchen, unwitnessed fall. Struck back of head. Subarachnoid hemorrhage, assess for aneurysm. EXAM: CT ANGIOGRAPHY HEAD TECHNIQUE: Multidetector CT imaging of the head was performed using the standard protocol during bolus administration of intravenous contrast. Multiplanar CT image reconstructions and MIPs were obtained to evaluate the vascular anatomy. CONTRAST:  14mL OMNIPAQUE IOHEXOL 350 MG/ML SOLN COMPARISON:  CT head January 21, 2015 at 2145 hours FINDINGS: Anterior circulation: Normal appearance of the cervical  internal carotid arteries, petrous, cavernous and supra clinoid internal carotid arteries. Widely patent anterior communicating artery. RIGHT A1 is dominant. Widely patent anterior and middle cerebral arteries. Mild luminal irregularity of the RIGHT M2 and M3 segments. Posterior circulation: LEFT vertebral artery is dominant with normal appearance of the vertebral arteries, vertebrobasilar junction and basilar artery, as well as main branch vessels. RIGHT vertebral artery terminates in the posterior inferior cerebellar artery. Normal appearance of the posterior cerebral arteries. No large vessel occlusion, hemodynamically significant stenosis, dissection, contrast extravasation or aneurysm within the anterior nor posterior circulation. Delayed phase demonstrates 3 mm RIGHT holo hemispheric subdural hematoma, evolving RIGHT inferior frontal lobe hemorrhagic contusion a moderate amount of subarachnoid hemorrhage predominately in the RIGHT sylvian fissure. 4 mm RIGHT to LEFT midline shift. IMPRESSION: No aneurysm. No acute large vessel occlusion or high-grade stenosis. Mild luminal irregularity of the RIGHT M2 segments, likely representing focal vasospasm in the presence of subarachnoid hemorrhage. 3 mm RIGHT acute subdural hematoma with moderate subarachnoid hemorrhage predominately in the sylvian fissure and, evolving RIGHT inferior frontal lobe hemorrhagic contusion. Electronically Signed   By: Elon Alas M.D.   On: 01/21/2015 23:59   Dg Chest 2 View  01/21/2015  CLINICAL DATA:  Syncopal episode at home tonight.  Hit head. EXAM: CHEST  2 VIEW COMPARISON:  None. FINDINGS: The cardiac silhouette, mediastinal and hilar contours are within normal limits. The lungs are clear. Mild biapical pleural thickening. The bony thorax is intact. IMPRESSION: No acute cardiopulmonary findings. Electronically Signed   By: Marijo Sanes M.D.   On: 01/21/2015 22:26   Ct Head Wo Contrast  01/21/2015  CLINICAL DATA:  Fall in  kitchen with occipital head injury. Loss of consciousness before fall. Initial encounter. EXAM: CT HEAD WITHOUT CONTRAST CT CERVICAL SPINE WITHOUT CONTRAST TECHNIQUE: Multidetector CT imaging of the head and cervical spine was performed following the standard protocol without intravenous contrast. Multiplanar CT image reconstructions of the cervical spine were also generated. COMPARISON:  None. FINDINGS: CT HEAD FINDINGS Skull and Sinuses:There is a left occipital scalp laceration and contusion with nondepressed oblique fracture through the occipital bone. Clear sinuses and mastoids. Orbits: No traumatic findings Brain: Moderate subarachnoid hemorrhage in and around the right sylvian fissure. Given the history, this is presumably traumatic. If there are clinical features concerning for primary aneurysmal hemorrhage, CTA could be obtained. There is a small subdural hematoma around the right cerebral convexity, measuring up to 4 mm thickness. Inferior right frontal parenchymal hemorrhage measuring 7 mm, with surrounding edema. Local mass effect without midline shift or herniation. No pre-existing infarct noted. No hydrocephalus or visible mass lesion. CT CERVICAL SPINE FINDINGS Negative for acute fracture or subluxation. No prevertebral edema. No gross cervical canal hematoma. Lower cervical degenerative disc narrowing with reversal  cervical lordosis. Uncovertebral spurs at C5-6 and C6-7 cause mild foraminal narrowing. Critical Value/emergent results were called by telephone at the time of interpretation on 01/21/2015 at 10:31 pm to Dr. Lonia Skinner , who verbally acknowledged these results. IMPRESSION: 1. Moderate subarachnoid hemorrhage around the right sylvian fissure. 2. Inferior right frontal hemorrhagic contusion with mild local mass effect. 3. Thin subdural hematoma around the right cerebral convexity. 4. Nondepressed occipital bone fracture. 5. No evidence of cervical spine injury. Electronically Signed   By:  Monte Fantasia M.D.   On: 01/21/2015 22:33   Ct Cervical Spine Wo Contrast  01/21/2015  CLINICAL DATA:  Fall in kitchen with occipital head injury. Loss of consciousness before fall. Initial encounter. EXAM: CT HEAD WITHOUT CONTRAST CT CERVICAL SPINE WITHOUT CONTRAST TECHNIQUE: Multidetector CT imaging of the head and cervical spine was performed following the standard protocol without intravenous contrast. Multiplanar CT image reconstructions of the cervical spine were also generated. COMPARISON:  None. FINDINGS: CT HEAD FINDINGS Skull and Sinuses:There is a left occipital scalp laceration and contusion with nondepressed oblique fracture through the occipital bone. Clear sinuses and mastoids. Orbits: No traumatic findings Brain: Moderate subarachnoid hemorrhage in and around the right sylvian fissure. Given the history, this is presumably traumatic. If there are clinical features concerning for primary aneurysmal hemorrhage, CTA could be obtained. There is a small subdural hematoma around the right cerebral convexity, measuring up to 4 mm thickness. Inferior right frontal parenchymal hemorrhage measuring 7 mm, with surrounding edema. Local mass effect without midline shift or herniation. No pre-existing infarct noted. No hydrocephalus or visible mass lesion. CT CERVICAL SPINE FINDINGS Negative for acute fracture or subluxation. No prevertebral edema. No gross cervical canal hematoma. Lower cervical degenerative disc narrowing with reversal cervical lordosis. Uncovertebral spurs at C5-6 and C6-7 cause mild foraminal narrowing. Critical Value/emergent results were called by telephone at the time of interpretation on 01/21/2015 at 10:31 pm to Dr. Lonia Skinner , who verbally acknowledged these results. IMPRESSION: 1. Moderate subarachnoid hemorrhage around the right sylvian fissure. 2. Inferior right frontal hemorrhagic contusion with mild local mass effect. 3. Thin subdural hematoma around the right cerebral  convexity. 4. Nondepressed occipital bone fracture. 5. No evidence of cervical spine injury. Electronically Signed   By: Monte Fantasia M.D.   On: 01/21/2015 22:33   I have personally reviewed and evaluated these images and lab results as part of my medical decision-making.   EKG Interpretation   Date/Time:  Monday January 21 2015 19:48:25 EST Ventricular Rate:  70 PR Interval:  163 QRS Duration: 148 QT Interval:  467 QTC Calculation: 504 R Axis:   3 Text Interpretation:  Age not entered, assumed to be  69 years old for  purpose of ECG interpretation Sinus rhythm Probable left atrial  enlargement Left bundle branch block No previous ECGs available Confirmed  by Lonia Skinner ([id number]) on 01/21/2015 11:32:04 PM      MDM  Patient seen and evaluated at bedside.  EKG with LBBB which patient has documented history of.  Nausea controlled with Zofran.  CT head with SAH and discussed with Dr. Saintclair Halsted who recommended CTA which was completed and negative for aneurysm.  Patient also with occipital fracture and laceration with small central area that violated galea and so Ancef given.  Laceration closed as detailed above.  Patient seen by Dr. Saintclair Halsted and admitted under his care with hospitalist following in consultation secondary to syncope. Final diagnoses:  Syncope    1. Syncope  2. Spring  3. Skull fracture    Harvel Quale, MD 01/22/15 [phone]

## 2015-01-22 NOTE — Progress Notes (Signed)
Patient ID: Mandy Durham, female   DOB: 06/30/1945, 69 y.o.   MRN: [id number] Patient doing well minimal headache no nausea no vomiting no numbness no tingling.  Continue to observe. Neurologically intact we'll repeat her head CT this morning patient continued to be discharged home later today or if any additional medical workup as needed will contact keep her transfer to floor admit her for 1 more days observation.

## 2015-01-23 DIAGNOSIS — I951 Orthostatic hypotension: Secondary | ICD-10-CM

## 2015-01-23 DIAGNOSIS — I10 Essential (primary) hypertension: Secondary | ICD-10-CM

## 2015-01-23 DIAGNOSIS — R55 Syncope and collapse: Secondary | ICD-10-CM

## 2015-01-23 DIAGNOSIS — E876 Hypokalemia: Secondary | ICD-10-CM

## 2015-01-23 DIAGNOSIS — I609 Nontraumatic subarachnoid hemorrhage, unspecified: Secondary | ICD-10-CM

## 2015-01-23 MED ORDER — PANTOPRAZOLE SODIUM 40 MG PO TBEC
40.0000 mg | DELAYED_RELEASE_TABLET | Freq: Two times a day (BID) | ORAL | Status: DC
  Administered 2015-01-23 – 2015-01-26 (×7): 40 mg via ORAL
  Filled 2015-01-23 (×6): qty 1

## 2015-01-23 MED ORDER — POTASSIUM CHLORIDE 10 MEQ/100ML IV SOLN
10.0000 meq | INTRAVENOUS | Status: AC
  Administered 2015-01-23 (×4): 10 meq via INTRAVENOUS
  Filled 2015-01-23 (×4): qty 100

## 2015-01-23 MED ORDER — PROMETHAZINE HCL 25 MG/ML IJ SOLN
12.5000 mg | INTRAMUSCULAR | Status: DC | PRN
  Administered 2015-01-23: 12.5 mg via INTRAVENOUS
  Filled 2015-01-23 (×2): qty 1

## 2015-01-23 MED ORDER — DEXAMETHASONE SODIUM PHOSPHATE 4 MG/ML IJ SOLN
4.0000 mg | Freq: Four times a day (QID) | INTRAMUSCULAR | Status: AC
Start: 2015-01-23 — End: 2015-01-24
  Administered 2015-01-23 – 2015-01-24 (×4): 4 mg via INTRAVENOUS
  Filled 2015-01-23 (×4): qty 1

## 2015-01-23 MED ORDER — ONDANSETRON HCL 4 MG/2ML IJ SOLN
4.0000 mg | INTRAMUSCULAR | Status: DC | PRN
  Administered 2015-01-28 (×2): 4 mg via INTRAVENOUS
  Filled 2015-01-23 (×2): qty 2

## 2015-01-23 NOTE — Progress Notes (Signed)
Patient ID: Mandy Durham, female   DOB: 10-Apr-1945, 69 y.o.   MRN: [id number] Patient is awake complaint of headache some vertigo and nausea.  Patient is awake alert oriented strength out of 5 no pronator drift able to equal  Hospital day 2 from a traumatic brain injury track subarachnoid hemorrhage skin subdural follow-up CT scan was remarkable just for the evolution of some of her contusions no change in her subdural and minimal mass effect. I will place the patient on Decadron to help with the meningeal inflammation I think this is contributing to the vertigo and nausea will order a CT scan for the morning.

## 2015-01-23 NOTE — Consult Note (Addendum)
[facility] TEAM 1 - Stepdown/ICU TEAM CONSULT F/U NOTE  Mandy Durham IOM:[id number] DOB: [birth year] DOA: 01/21/2015 PCP: Kathlene November, MD  Admit HPI / Brief Narrative: 69 y.o. female w/ a history of Insomnia; Adenomatous polyp of colon; Hyperlipidemia; OA; Hypertension; Syncope; and LBBB who presented with a fall in her kitchen. Her husband heard her fall and found her unconscious. She was standing at the sink for about 3 min and felt lightheaded. She called out his name prior to hitting the floor. Patient fell backwards hitting her head. She has hx of orthostatic hypotension in the past. Her prior episodes of syncope have often occurred after prolonged standing. Patient in the past was treated with Florinef but failed therapy.  She was started on Mestinon 1 year ago and did well for a while although she still had occasional episodes of lightheadedness. Patient has been followed for this by Cardiology.  Her last echogram was 2014 showing EF of 50-55% with mild LVH and septal lateral dyssynchrony with the bundle-branch block and no significant arrhythmia noted on Holter monitor. Cardiology was worried about autonomic insufficiency. She reported good intake prior, and denied shortness of breath, chest pain,  leg swelling.  On Arrival to ER she was found to have 3 mm subarachnoid hemorrhage, and was admitted to the NS service.     HPI/Subjective: The patient reports unrelenting nausea with intermittent vomiting which is severely exacerbated by positional change.  She denies recurrence of syncope.  She denies chest pain shortness of breath or abdominal pain.  She does report generalized achy pain in the head.  In that medical issues appear to predominate at present and given the patient will not require surgical intervention I have offered to assume the primary role for this patient and Dr. Saintclair Halsted has agreed.  Recommendations/Plan:  Traumatic SAH Per primary service (NS)   Intractable nausea with  positional vertigo ?concussion symptoms - adjust antibiotics and follow  Syncope and collapse - Orthostatic hypotension  No change in TTE compared to most recent prior - carotid dopplers w/o evidence of signif stenosis B - a recurring issue being followed by [facility] as outpt - given the apparent extent of the prior outpatient workup in the chronic nature of this issue further testing as an inpatient is not likely to prove productive - patient should follow-up with her Cardiologist who has been monitoring this issue  Mild hypokalemia likely due to simple poor intake - supplement   Essential hypertension Avoid overaggressive correction in this clinical setting - follow w/o change in tx plan today   Code Status: FULL Family Communication: Spoke with husband and 2 daughters at bedside  Antibiotics: none  DVT prophylaxis: SCDs  Objective: Blood pressure 145/64, pulse 71, temperature 97.4 F (36.3 C), temperature source Oral, resp. rate 18, height 5' 5.5" (1.664 m), weight 51.71 kg (114 lb), SpO2 91 %.  Intake/Output Summary (Last 24 hours) at 01/23/15 0925 Last data filed at 01/23/15 0500  Gross per 24 hour  Intake   1500 ml  Output    525 ml  Net    975 ml   Exam: General: No acute respiratory distress Lungs: Clear to auscultation bilaterally without wheezes or crackles Cardiovascular: Regular rate and rhythm without murmur gallop or rub normal S1 and S2 Abdomen: Nontender, nondistended, soft, bowel sounds positive, no rebound, no ascites, no appreciable mass Extremities: No significant cyanosis, clubbing, or edema bilateral lower extremities  Data Reviewed: Basic Metabolic Panel:  Recent Labs Lab 01/21/15 2038  01/22/15 1100  NA 138 136  K 4.0 3.3*  CL 105 102  CO2 27 25  GLUCOSE 150* 125*  BUN 13 11  CREATININE 0.70 0.76  CALCIUM 8.8* 8.8*  MG  --  1.9    CBC:  Recent Labs Lab 01/21/15 2038 01/22/15 1100  WBC 13.2* 11.1*  NEUTROABS  --  9.1*  HGB  12.8 13.1  HCT 39.0 39.5  MCV 85.2 85.9  PLT 304 307    Liver Function Tests:  Recent Labs Lab 01/22/15 1100  AST 27  ALT 21  ALKPHOS 73  BILITOT 0.5  PROT 6.5  ALBUMIN 3.2*   Cardiac Enzymes:  Recent Labs Lab 01/22/15 0441 01/22/15 1100 01/22/15 1600  TROPONINI <0.03 <0.03 <0.03    CBG:  Recent Labs Lab 01/21/15 1954  GLUCAP 141*    Recent Results (from the past 240 hour(s))  MRSA PCR Screening     Status: None   Collection Time: 01/22/15  4:48 AM  Result Value Ref Range Status   MRSA by PCR NEGATIVE NEGATIVE Final    Comment:        The GeneXpert MRSA Assay (FDA approved for NASAL specimens only), is one component of a comprehensive MRSA colonization surveillance program. It is not intended to diagnose MRSA infection nor to guide or monitor treatment for MRSA infections.      Studies:   Recent x-ray studies have been reviewed in detail by the Attending Physician  Scheduled Meds:  Scheduled Meds: . calcium carbonate  1,250 mg Oral Q breakfast  . cholecalciferol  2,000 Units Oral Daily  . metoprolol tartrate  12.5 mg Oral BID  . ondansetron (ZOFRAN) IV  4 mg Intravenous Once  . pantoprazole  40 mg Oral QHS  . PARoxetine  10 mg Oral Daily  . pyridostigmine  60 mg Oral TID  . senna-docusate  1 tablet Oral BID    Time spent on care of this patient: 35 mins   Fern Asmar T , MD   [facility] Office  [phone] Pager - Text Page per Shea Evans as per below:  On-Call/Text Page:      [email or web address]      password [email or web address]  If 7PM-7AM, please contact night-coverage [email or web address] Password [email or web address] 01/23/2015, 9:25 AM   LOS: 1 day

## 2015-01-24 ENCOUNTER — Encounter (HOSPITAL_COMMUNITY): Payer: Self-pay | Admitting: Radiology

## 2015-01-24 ENCOUNTER — Inpatient Hospital Stay (HOSPITAL_COMMUNITY): Payer: Medicare HMO

## 2015-01-24 DIAGNOSIS — S020XXA Fracture of vault of skull, initial encounter for closed fracture: Secondary | ICD-10-CM

## 2015-01-24 DIAGNOSIS — S069XAA Unspecified intracranial injury with loss of consciousness status unknown, initial encounter: Secondary | ICD-10-CM | POA: Diagnosis present

## 2015-01-24 DIAGNOSIS — S0291XA Unspecified fracture of skull, initial encounter for closed fracture: Secondary | ICD-10-CM | POA: Diagnosis present

## 2015-01-24 DIAGNOSIS — R42 Dizziness and giddiness: Secondary | ICD-10-CM | POA: Diagnosis present

## 2015-01-24 DIAGNOSIS — S069X9A Unspecified intracranial injury with loss of consciousness of unspecified duration, initial encounter: Secondary | ICD-10-CM | POA: Diagnosis present

## 2015-01-24 LAB — CORTISOL: Cortisol, Plasma: 1.8 ug/dL

## 2015-01-24 LAB — BASIC METABOLIC PANEL
ANION GAP: 8 (ref 5–15)
BUN: 10 mg/dL (ref 6–20)
CALCIUM: 9.1 mg/dL (ref 8.9–10.3)
CO2: 26 mmol/L (ref 22–32)
Chloride: 104 mmol/L (ref 101–111)
Creatinine, Ser: 0.67 mg/dL (ref 0.44–1.00)
Glucose, Bld: 135 mg/dL — ABNORMAL HIGH (ref 65–99)
POTASSIUM: 4.4 mmol/L (ref 3.5–5.1)
Sodium: 138 mmol/L (ref 135–145)

## 2015-01-24 LAB — MAGNESIUM: MAGNESIUM: 1.8 mg/dL (ref 1.7–2.4)

## 2015-01-24 NOTE — Progress Notes (Signed)
Dr. Saintclair Halsted to bedside; notified that the patient is experiencing visual hallucinations and oriented to person, place only.

## 2015-01-24 NOTE — Progress Notes (Signed)
TEAM 1 - Stepdown/ICU TEAM Progress Note  Sigurd S Zuidema [id number] DOB: [birth year] DOA: 01/21/2015 PCP: Kathlene November, MD  Admit HPI / Brief Narrative: 69 y.o.WF PMHx Insomnia; Adenomatous polyp of colon; Hyperlipidemia; OA; Hypertension; Multiple Syncope and collapse episodes; and LBBB   Presented with a fall in her kitchen. Her husband heard her fall and found her unconscious. She was standing at the sink for about 3 min and felt lightheaded. She called out his name prior to hitting the floor. Patient fell backwards hitting her head. She has hx of orthostatic hypotension in the past. Her prior episodes of syncope have often occurred after prolonged standing. Patient in the past was treated with Florinef but failed therapy. She was started on Mestinon 1 year ago and did well for a while although she still had occasional episodes of lightheadedness. Patient has been followed for this by Cardiology. Her last echogram was 2014 showing EF of 50-55% with mild LVH and septal lateral dyssynchrony with the bundle-branch block and no significant arrhythmia noted on Holter monitor. Cardiology was worried about autonomic insufficiency. She reported good intake prior, and denied shortness of breath, chest pain, leg swelling. On Arrival to ER she was found to have 3 mm subarachnoid hemorrhage, and was admitted to the NS service.    HPI/Subjective: 11/10 A/O 4, negative hallucinations patient answering and asking appropriate questions. Daughters present.  Assessment/Plan: Traumatic SAH -Per trauma service  -11/10 CT head; show some worsening aspects of patient's bleed see results below   Intractable nausea with positional vertigo -concussion symptoms; beginning to resolve  Syncope and collapse - Orthostatic hypotension  -No change in TTE compared to most recent prior - carotid dopplers w/o evidence of signif stenosis B - a recurring issue being followed by [facility] as outpt -  given the apparent extent of the prior outpatient workup in the chronic nature of this issue further testing as an inpatient is not likely to prove productive - patient should follow-up with her Cardiologist who has been monitoring this issue -Most likely secondary to iatrogenic lowering of BP. -Would keep patient's SBP~140-150  -PT/OT consult  Mild hypokalemia Resolved  Essential hypertension -Allow permissive HTN secondary to [facility] and syncope events    Code Status: FULL Family Communication: Daughters present at time of exam Disposition Plan: Per neurosurgery   Consultants: Dr.Gary Cram (neurosurgery)   Procedure/Significant Events: 11/7 CT angiogram head;No aneurysm. 3 mm RIGHT acute subdural hematoma with moderate subarachnoid hemorrhage predominately in the sylvian fissure and, evolving RIGHT inferior frontal lobe hemorrhagic contusion. 11/10 CT head without contrast;Anterior right frontal lobe parenchymal hematoma has increased in size now measuring 11.7 x 12.7 mm versus prior 7.8 x 9.5 mm. -Increase amount of surrounding vasogenic edema. -Mild mass effect upon the frontal horn of the right lateral -Nondisplaced calvarial fracture extends from the left parietal region into the right occipital region/the skullbase to the right of midline.  Culture NA  Antibiotics: NA  DVT prophylaxis: CD   Devices    LINES / TUBES:      Continuous Infusions: . sodium chloride 60 mL/hr at 01/24/15 1722    Objective: VITAL SIGNS: Temp: 97.3 F (36.3 C) (11/10 1455) Temp Source: Oral (11/10 1455) BP: 131/56 mmHg (11/10 1544) Pulse Rate: 75 (11/10 1544) SPO2; FIO2:   Intake/Output Summary (Last 24 hours) at 01/24/15 1835 Last data filed at 01/24/15 1803  Gross per 24 hour  Intake   1983 ml  Output    300 ml  Net   1683 ml     Exam: General: Earlier reported hallucinations, at time of exam A/O 4, negative hallucinations patient answering and asking appropriate  questions No acute respiratory distress Eyes: Negative headache, negative scleral hemorrhage ENT: Negative Runny nose, negative gingival bleeding, Neck:  Negative scars, masses, torticollis, lymphadenopathy, JVD Lungs: Clear to auscultation bilaterally without wheezes or crackles Cardiovascular: Regular rate and rhythm without murmur gallop or rub normal S1 and S2 Abdomen:negative abdominal pain, nondistended, positive soft, bowel sounds, no rebound, no ascites, no appreciable mass Extremities: No significant cyanosis, clubbing, or edema bilateral lower extremities Psychiatric:  Negative depression, negative anxiety, negative fatigue, negative mania  Neurologic:  Cranial nerves II through XII intact, tongue/uvula midline, all extremities muscle strength 5/5, sensation intact throughout,  negative dysarthria, negative expressive aphasia, negative receptive aphasia.   Data Reviewed: Basic Metabolic Panel:  Recent Labs Lab 01/21/15 2038 01/22/15 1100 01/24/15 0330  NA 138 136 138  K 4.0 3.3* 4.4  CL 105 102 104  CO2 27 25 26   GLUCOSE 150* 125* 135*  BUN 13 11 10   CREATININE 0.70 0.76 0.67  CALCIUM 8.8* 8.8* 9.1  MG  --  1.9 1.8   Liver Function Tests:  Recent Labs Lab 01/22/15 1100  AST 27  ALT 21  ALKPHOS 73  BILITOT 0.5  PROT 6.5  ALBUMIN 3.2*   No results for input(s): LIPASE, AMYLASE in the last 168 hours. No results for input(s): AMMONIA in the last 168 hours. CBC:  Recent Labs Lab 01/21/15 2038 01/22/15 1100  WBC 13.2* 11.1*  NEUTROABS  --  9.1*  HGB 12.8 13.1  HCT 39.0 39.5  MCV 85.2 85.9  PLT 304 307   Cardiac Enzymes:  Recent Labs Lab 01/22/15 0441 01/22/15 1100 01/22/15 1600  TROPONINI <0.03 <0.03 <0.03   BNP (last 3 results) No results for input(s): BNP in the last 8760 hours.  ProBNP (last 3 results) No results for input(s): PROBNP in the last 8760 hours.  CBG:  Recent Labs Lab 01/21/15 1954  GLUCAP 141*    Recent Results (from  the past 240 hour(s))  MRSA PCR Screening     Status: None   Collection Time: 01/22/15  4:48 AM  Result Value Ref Range Status   MRSA by PCR NEGATIVE NEGATIVE Final    Comment:        The GeneXpert MRSA Assay (FDA approved for NASAL specimens only), is one component of a comprehensive MRSA colonization surveillance program. It is not intended to diagnose MRSA infection nor to guide or monitor treatment for MRSA infections.      Studies:  Recent x-ray studies have been reviewed in detail by the Attending Physician  Scheduled Meds:  Scheduled Meds: . metoprolol tartrate  12.5 mg Oral BID  . pantoprazole  40 mg Oral BID  . PARoxetine  10 mg Oral Daily  . pyridostigmine  60 mg Oral TID  . senna-docusate  1 tablet Oral BID    Time spent on care of this patient: 40 mins   WOODS, Geraldo Docker , MD  [facility] Office  [phone] Pager - [phone]  On-Call/Text Page:      [email or web address]      password [email or web address]  If 7PM-7AM, please contact night-coverage [email or web address] Password [email or web address] 01/24/2015, 6:35 PM   LOS: 2 days   Care during the described time interval was provided by me .  I have reviewed this patient's available data, including medical history, events of note, physical examination, and all  test results as part of my evaluation. I have personally reviewed and interpreted all radiology studies.   Dia Crawford, MD [phone] Pager

## 2015-01-24 NOTE — Progress Notes (Signed)
Pt having visual hallucinations.  Stated the toilet tissue holder in the bathroom looked "like a little boy's head", that the bag of supplies on the window ledge looked "like a woman with her head in a pocketbook", and that she had seen a baby somewhere over near the nightstand.  Pt reoriented and lights turned on to possibly help with what she sees.

## 2015-01-25 DIAGNOSIS — F05 Delirium due to known physiological condition: Secondary | ICD-10-CM

## 2015-01-25 LAB — URINALYSIS, ROUTINE W REFLEX MICROSCOPIC
Bilirubin Urine: NEGATIVE
Glucose, UA: NEGATIVE mg/dL
HGB URINE DIPSTICK: NEGATIVE
Ketones, ur: NEGATIVE mg/dL
Leukocytes, UA: NEGATIVE
NITRITE: NEGATIVE
PH: 7.5 (ref 5.0–8.0)
Protein, ur: NEGATIVE mg/dL
SPECIFIC GRAVITY, URINE: 1.011 (ref 1.005–1.030)
UROBILINOGEN UA: 0.2 mg/dL (ref 0.0–1.0)

## 2015-01-25 LAB — BASIC METABOLIC PANEL
Anion gap: 8 (ref 5–15)
BUN: 11 mg/dL (ref 6–20)
CO2: 24 mmol/L (ref 22–32)
Calcium: 8.6 mg/dL — ABNORMAL LOW (ref 8.9–10.3)
Chloride: 106 mmol/L (ref 101–111)
Creatinine, Ser: 0.61 mg/dL (ref 0.44–1.00)
GFR calc Af Amer: >60 mL/min (ref >60)
GFR calc non Af Amer: >60 mL/min (ref >60)
Glucose, Bld: 101 mg/dL — ABNORMAL HIGH (ref 65–99)
Potassium: 3.3 mmol/L — ABNORMAL LOW (ref 3.5–5.1)
Sodium: 138 mmol/L (ref 135–145)

## 2015-01-25 LAB — VITAMIN B12: VITAMIN B 12: 391 pg/mL (ref 180–914)

## 2015-01-25 LAB — FOLATE: FOLATE: 20.3 ng/mL (ref >5.9)

## 2015-01-25 MED ORDER — METOPROLOL TARTRATE 25 MG PO TABS
25.0000 mg | ORAL_TABLET | Freq: Two times a day (BID) | ORAL | Status: DC
  Administered 2015-01-25 – 2015-01-28 (×6): 25 mg via ORAL
  Filled 2015-01-25 (×6): qty 1

## 2015-01-25 MED ORDER — METOPROLOL TARTRATE 12.5 MG HALF TABLET
12.5000 mg | ORAL_TABLET | Freq: Once | ORAL | Status: AC
  Administered 2015-01-25: 12.5 mg via ORAL

## 2015-01-25 MED ORDER — QUETIAPINE FUMARATE 25 MG PO TABS
12.5000 mg | ORAL_TABLET | Freq: Every day | ORAL | Status: AC
  Administered 2015-01-25 – 2015-01-27 (×3): 12.5 mg via ORAL
  Filled 2015-01-25 (×4): qty 1

## 2015-01-25 NOTE — Evaluation (Signed)
Physical Therapy Evaluation Patient Details Name: Mandy Durham MRN: [id number] DOB: 06-Nov-1945 Today's Date: 01/25/2015   History of Present Illness  pt presents after fall sustaining R [place] SDH.  pt with hx of syncope, HTN, and Orthostatic hypotension.    Clinical Impression  Pt with cognitive and balance deficits affecting her safety with mobility.  Pt has difficulty staying on task and would benefit from CIR at D/C to maximize independence  Prior to returning home with family.      Follow Up Recommendations CIR    Equipment Recommendations  None recommended by PT    Recommendations for Other Services Rehab consult     Precautions / Restrictions Precautions Precautions: Fall Restrictions Weight Bearing Restrictions: No      Mobility  Bed Mobility Overal bed mobility: Needs Assistance Bed Mobility: Sit to Supine       Sit to supine: Min guard   General bed mobility comments: pt needs increased time, but was to complete without physical A.    Transfers Overall transfer level: Needs assistance Equipment used: None Transfers: Sit to/from Stand Sit to Stand: Min assist;Mod assist         General transfer comment: Amount of A fluctuates pending pt's attention to task.  pt with posterior and R lateral lean.    Ambulation/Gait Ambulation/Gait assistance: Min assist Ambulation Distance (Feet): 15 Feet (x2) Assistive device: 1 person hand held assist Gait Pattern/deviations: Step-through pattern;Decreased stride length;Narrow base of support     General Gait Details: pt moves slowly and off balance.  pt fatigues easily and needed to sit down after only 15'.  pt continues to lean posteriorly and to R side.    Stairs            Wheelchair Mobility    Modified Rankin (Stroke Patients Only)       Balance Overall balance assessment: Needs assistance;History of Falls Sitting-balance support: No upper extremity supported;Feet supported Sitting  balance-Leahy Scale: Fair     Standing balance support: No upper extremity supported;During functional activity Standing balance-Leahy Scale: Poor                               Pertinent Vitals/Pain Pain Assessment: No/denies pain    Home Living Family/patient expects to be discharged to:: Inpatient rehab                      Prior Function Level of Independence: Independent               Hand Dominance        Extremity/Trunk Assessment   Upper Extremity Assessment: Defer to OT evaluation           Lower Extremity Assessment: Generalized weakness      Cervical / Trunk Assessment: Kyphotic  Communication   Communication: No difficulties  Cognition Arousal/Alertness: Awake/alert Behavior During Therapy: Restless Overall Cognitive Status: Impaired/Different from baseline Area of Impairment: Attention;Orientation;Memory;Following commands;Safety/judgement;Awareness;Problem solving Orientation Level: Disoriented to;Time;Situation Current Attention Level: Sustained Memory: Decreased recall of precautions;Decreased short-term memory Following Commands: Follows one step commands inconsistently;Follows one step commands with increased time Safety/Judgement: Decreased awareness of safety;Decreased awareness of deficits Awareness: Intellectual Problem Solving: Slow processing;Decreased initiation;Difficulty sequencing;Requires verbal cues;Requires tactile cues General Comments: pt was able to state she was at [facility], but thought she was at one of the outpatient clinics.  When given options of why she was in the hospital, pt was  able to pick the correct option.  Difficult to keep on task at times.      General Comments      Exercises        Assessment/Plan    PT Assessment Patient needs continued PT services  PT Diagnosis Difficulty walking;Generalized weakness   PT Problem List Decreased strength;Decreased activity  tolerance;Decreased balance;Decreased mobility;Decreased coordination;Decreased cognition;Decreased knowledge of use of DME;Decreased safety awareness  PT Treatment Interventions DME instruction;Gait training;Stair training;Functional mobility training;Therapeutic activities;Therapeutic exercise;Balance training;Neuromuscular re-education;Patient/family education;Cognitive remediation   PT Goals (Current goals can be found in the Care Plan section) Acute Rehab PT Goals Patient Stated Goal: Per pt to go home today. PT Goal Formulation: With patient/family Time For Goal Achievement: 02/08/15 Potential to Achieve Goals: Good    Frequency Min 3X/week   Barriers to discharge        Co-evaluation               End of Session Equipment Utilized During Treatment: Gait belt Activity Tolerance: Patient limited by fatigue Patient left: in bed;with call bell/phone within reach;with bed alarm set;with family/visitor present Nurse Communication: Mobility status         Time: [id number] PT Time Calculation (min) (ACUTE ONLY): 26 min   Charges:   PT Evaluation $Initial PT Evaluation Tier I: 1 Procedure PT Treatments $Gait Training: 8-22 mins   PT G CodesCatarina Hartshorn, [place] 01/25/2015, 1:21 PM

## 2015-01-25 NOTE — Progress Notes (Signed)
Rehab Admissions Coordinator Note:  Patient was screened by Retta Diones for appropriateness for an Inpatient Acute Rehab Consult.  At this time, we are recommending Inpatient Rehab consult.  Retta Diones 01/25/2015, 2:31 PM  I can be reached at [phone].

## 2015-01-25 NOTE — Care Management Important Message (Signed)
Important Message  Patient Details  Name: Janisa ELNER SULA MRN: [id number] Date of Birth: 31-Jul-1945   Medicare Important Message Given:  Yes    Barb Merino Dajai Wahlert 01/25/2015, 2:24 PM

## 2015-01-25 NOTE — Progress Notes (Addendum)
Inpatient Rehabilitation  I met with the patient, her husband and her son in law at the bedside to discuss her  post acute rehab needs and physician recommendation for IP Rehab.  Pt. With limited participation in conversation due to confusion and apparent hallucinations.  I provided informational booklets and answered her family's questions.  They would like to have me seek authorization for IP Rehab.  I have begun the process this afternoon and  will follow up on Monday.   Please call if questions.    PT Inpatient Rehab Admissions Coordinator Cell [phone] Office [phone]  

## 2015-01-25 NOTE — Progress Notes (Signed)
Patient ID: Mandy Durham, female   DOB: 1945/10/29, 69 y.o.   MRN: [id number] Late entry note: Saw the patient on rounds yesterday morning around 7:30 AM and patient was clearly much better than she was the day before. It appears these steroids did help significant improvement level of consciousness and decrease her headache and nausea. Follow-up CT revealed expected evolution of frontal contusion with minimal mass effect.

## 2015-01-25 NOTE — Care Management Note (Signed)
Case Management Note  Patient Details  Name: Raeli GIANELLE WEISNER MRN: [id number] Date of Birth: 1945/05/06  Subjective/Objective:                    Action/Plan: Patient admitted with a [place] from a fall at home. Pt is from home with her husband. PT recommending CIR and she is being worked up by [employer]. CM will continue to follow for discharge needs.   Expected Discharge Date:  01/24/15               Expected Discharge Plan:  Home/Self Care  In-House Referral:     Discharge planning Services  CM Consult  Post Acute Care Choice:    Choice offered to:     DME Arranged:    DME Agency:     HH Arranged:    HH Agency:     Status of Service:  In process, will continue to follow  Medicare Important Message Given:  Yes Date Medicare IM Given:    Medicare IM give by:    Date Additional Medicare IM Given:    Additional Medicare Important Message give by:     If discussed at [place] of Stay Meetings, dates discussed:    Additional Comments:  Pollie Friar, RN 01/25/2015, 3:47 PM

## 2015-01-25 NOTE — Consult Note (Signed)
Physical Medicine and Rehabilitation Consult Reason for Consult: Traumatic right frontal subdural hematoma Referring Physician: [facility]   HPI: Mandy Durham is a 69 y.o. right handed female with history of hypertension as well as syncopal episodes in the past. Independent prior to admission living with spouse Presented 01/21/2015 after a fall in the kitchen and struck her head. Complaints of headache. CT of the head showed anterior right frontal lobe parenchymal hematoma measuring 11 x 7 x 12.7 mm with mild mass effect. Neurosurgery Dr. Saintclair Halsted with conservative care. Echocardiogram with ejection fraction of [id number] grade 1 diastolic dysfunction. Carotid Dopplers with no ICA stenosis. Follow-up cranial CT scan remarkable for evolution of some of her contusions no change in subdural and minimal mass effect. Maintained on Decadron protocol. Tolerating a regular diet. Physical therapy evaluation completed 01/25/2015 with recommendations of physical medicine rehabilitation consult.   Review of Systems  Constitutional: Negative for fever and chills.  HENT: Negative for hearing loss.   Eyes: Negative for blurred vision and double vision.  Respiratory: Negative for cough and shortness of breath.   Cardiovascular: Negative for chest pain, palpitations and leg swelling.  Gastrointestinal: Positive for constipation. Negative for nausea and vomiting.  Genitourinary: Negative for dysuria and hematuria.  Musculoskeletal: Positive for myalgias and falls.  Skin: Negative for rash.  Neurological: Positive for dizziness and headaches. Negative for seizures.       Syncopal episodes  Psychiatric/Behavioral: The patient has insomnia.        Anxiety   Past Medical History  Diagnosis Date  . Insomnia   . Adenomatous polyp of colon   . Overweight(278.02)   . Symptomatic menopausal or female climacteric states   . Hyperlipidemia   . Osteoarthrosis, unspecified whether generalized or localized, unspecified  site   . Anxiety state, unspecified   . Hypertension   . Syncope and collapse     11/12: Holter 12/12 with rare PACS, HR range 64-140, average 82, no significant arrhythmias. Echo (1/13): EF 50-55%, mild LVH, septal-lateral dyssynchrony c/w LBBB. 3-week event monitor (1/13): No significant arrhythmia.   Marland Kitchen LBBB (left bundle branch block)     LHC in 2002 showed normal coronaries.    Past Surgical History  Procedure Laterality Date  . Tubal ligation     Family History  Problem Relation Age of Onset  . Heart failure Mother   . Coronary artery disease Mother   . Dementia Mother   . Diabetes Mother   . Colon cancer Neg Hx   . Breast cancer Neg Hx   . Hypertension Neg Hx   . Heart disease Neg Hx   . Heart attack Neg Hx   . Stroke Neg Hx    Social History:  reports that she has never smoked. She has never used smokeless tobacco. She reports that she does not drink alcohol or use illicit drugs. Allergies: No Known Allergies Medications Prior to Admission  Medication Sig Dispense Refill  . ALPRAZolam (XANAX) 0.25 MG tablet Take 1-2 tablets (0.25-0.5 mg total) by mouth at bedtime as needed for sleep. 60 tablet 1  . calcium carbonate (OS-CAL) 600 MG TABS tablet Take 1,200 mg by mouth daily with breakfast.    . cholecalciferol (VITAMIN D) 1000 UNITS tablet Take 2,000 Units by mouth daily.     . metoprolol tartrate (LOPRESSOR) 25 MG tablet Take 1 tablet (25 mg total) by mouth 2 (two) times daily. 180 tablet 3  . PARoxetine (PAXIL) 10 MG tablet Take 10 mg  by mouth daily.    Marland Kitchen pyridostigmine (MESTINON) 60 MG tablet Take 1 tablet (60 mg total) by mouth 3 (three) times daily. 135 tablet 3  . hydrocortisone 2.5 % cream Apply topically 2 (two) times daily. (Patient not taking: Reported on 01/21/2015) 30 g 0    Home: Home Living Family/patient expects to be discharged to:: Inpatient rehab Living Arrangements: Spouse/significant other  Functional History: Prior Function Level of Independence:  Independent Functional Status:  Mobility: Bed Mobility Overal bed mobility: Needs Assistance Bed Mobility: Sit to Supine Sit to supine: Min guard General bed mobility comments: pt needs increased time, but was to complete without physical A.   Transfers Overall transfer level: Needs assistance Equipment used: None Transfers: Sit to/from Stand Sit to Stand: Min assist, Mod assist General transfer comment: Amount of A fluctuates pending pt's attention to task.  pt with posterior and R lateral lean.   Ambulation/Gait Ambulation/Gait assistance: Min assist Ambulation Distance (Feet): 15 Feet (x2) Assistive device: 1 person hand held assist Gait Pattern/deviations: Step-through pattern, Decreased stride length, Narrow base of support General Gait Details: pt moves slowly and off balance.  pt fatigues easily and needed to sit down after only 15'.  pt continues to lean posteriorly and to R side.      ADL:    Cognition: Cognition Overall Cognitive Status: Impaired/Different from baseline Orientation Level: Oriented to person, Disoriented to place, Disoriented to time, Disoriented to situation Cognition Arousal/Alertness: Awake/alert Behavior During Therapy: Restless Overall Cognitive Status: Impaired/Different from baseline Area of Impairment: Attention, Orientation, Memory, Following commands, Safety/judgement, Awareness, Problem solving Orientation Level: Disoriented to, Time, Situation Current Attention Level: Sustained Memory: Decreased recall of precautions, Decreased short-term memory Following Commands: Follows one step commands inconsistently, Follows one step commands with increased time Safety/Judgement: Decreased awareness of safety, Decreased awareness of deficits Awareness: Intellectual Problem Solving: Slow processing, Decreased initiation, Difficulty sequencing, Requires verbal cues, Requires tactile cues General Comments: pt was able to state she was at [facility],  but thought she was at one of the outpatient clinics.  When given options of why she was in the hospital, pt was able to pick the correct option.  Difficult to keep on task at times.    Blood pressure 170/77, pulse 62, temperature 98.2 F (36.8 C), temperature source Oral, resp. rate 16, height 5' 5.5" (1.664 m), weight 51.71 kg (114 lb), SpO2 94 %. Physical Exam  HENT:  Head: Normocephalic.  Eyes: EOM are normal.  Neck: Normal range of motion. Neck supple. No thyromegaly present.  Cardiovascular: Normal rate and regular rhythm.   Respiratory: Effort normal and breath sounds normal. No respiratory distress.  GI: Soft. Bowel sounds are normal. She exhibits no distension.  Neurological:  Mood is flat but appropriate. She is oriented to person and place. She did not recall for events of her fall. Follows simple commands.  Skin: Skin is warm and dry.    Results for orders placed or performed during the hospital encounter of 01/21/15 (from the past 24 hour(s))  Urinalysis, Routine w reflex microscopic (not at [facility])     Status: None   Collection Time: 01/25/15  9:15 AM  Result Value Ref Range   Color, Urine YELLOW YELLOW   APPearance CLEAR CLEAR   Specific Gravity, Urine 1.011 1.005 - 1.030   pH 7.5 5.0 - 8.0   Glucose, UA NEGATIVE NEGATIVE mg/dL   Hgb urine dipstick NEGATIVE NEGATIVE   Bilirubin Urine NEGATIVE NEGATIVE   Ketones, ur NEGATIVE NEGATIVE mg/dL  Protein, ur NEGATIVE NEGATIVE mg/dL   Urobilinogen, UA 0.2 0.0 - 1.0 mg/dL   Nitrite NEGATIVE NEGATIVE   Leukocytes, UA NEGATIVE NEGATIVE  Basic metabolic panel     Status: Abnormal   Collection Time: 01/25/15  9:24 AM  Result Value Ref Range   Sodium 138 135 - 145 mmol/L   Potassium 3.3 (L) 3.5 - 5.1 mmol/L   Chloride 106 101 - 111 mmol/L   CO2 24 22 - 32 mmol/L   Glucose, Bld 101 (H) 65 - 99 mg/dL   BUN 11 6 - 20 mg/dL   Creatinine, Ser 0.61 0.44 - 1.00 mg/dL   Calcium 8.6 (L) 8.9 - 10.3 mg/dL   GFR calc non Af Amer >60  >60 mL/min   GFR calc Af Amer >60 >60 mL/min   Anion gap 8 5 - 15  Vitamin B12     Status: None   Collection Time: 01/25/15 12:30 PM  Result Value Ref Range   Vitamin B-12 391 180 - 914 pg/mL  Folate     Status: None   Collection Time: 01/25/15 12:30 PM  Result Value Ref Range   Folate 20.3 >5.9 ng/mL   Ct Head Wo Contrast  01/24/2015  CLINICAL DATA:  69 year old female post fall 3 days ago. Subsequent encounter. EXAM: CT HEAD WITHOUT CONTRAST TECHNIQUE: Contiguous axial images were obtained from the base of the skull through the vertex without intravenous contrast. COMPARISON:  Most recent exam 01/22/2015. FINDINGS: Anterior right frontal lobe parenchymal hematoma has increased in size now measuring 11.7 x 12.7 mm versus prior 7.8 x 9.5 mm. Increase amount of surrounding vasogenic edema. Mild mass effect upon the frontal horn of the right lateral ventricle without midline shift. No significant change right sylvian fissure/right frontal region subarachnoid hemorrhage. Similar appearance of broad-based right frontal-temporal subdural hematoma measuring up to 3 mm. Nondisplaced calvarial fracture extends from the left parietal region into the right occipital region/the skullbase to the right of midline. No CT evidence of large acute thrombotic infarct. No intracranial mass lesion noted on this unenhanced exam. IMPRESSION: Anterior right frontal lobe parenchymal hematoma has increased in size now measuring 11.7 x 12.7 mm versus prior 7.8 x 9.5 mm. Increase amount of surrounding vasogenic edema. Mild mass effect upon the frontal horn of the right lateral ventricle without midline shift. No significant change right sylvian fissure/right frontal region subarachnoid hemorrhage. Similar appearance of broad-based right frontal-temporal subdural hematoma measuring up to 3 mm. Nondisplaced calvarial fracture extends from the left parietal region into the right occipital region/the skullbase to the right of  midline. Electronically Signed   By: Genia Del M.D.   On: 01/24/2015 07:01    Assessment/Plan: Diagnosis: traumatic right fronto-temporal subdural hemorrhage, right frontal intraparenchymal hemorrhage 1. Does the need for close, 24 hr/day medical supervision in concert with the patient's rehab needs make it unreasonable for this patient to be served in a less intensive setting? Yes 2. Co-Morbidities requiring supervision/potential complications: syncope, orthostasis, hypertension, hx of LBBB,  3. Due to bladder management, bowel management, safety, skin/wound care, disease management, medication administration, pain management and patient education, does the patient require 24 hr/day rehab nursing? Yes 4. Does the patient require coordinated care of a physician, rehab nurse, PT (1-2 hrs/day, 5 days/week), OT (1-2 hrs/day, 5 days/week) and SLP (1-2 hrs/day, 5 days/week) to address physical and functional deficits in the context of the above medical diagnosis(es)? Yes Addressing deficits in the following areas: balance, endurance, locomotion, strength, transferring, bowel/bladder control, bathing,  dressing, feeding, grooming, toileting, cognition and psychosocial support 5. Can the patient actively participate in an intensive therapy program of at least 3 hrs of therapy per day at least 5 days per week? Yes 6. The potential for patient to make measurable gains while on inpatient rehab is excellent 7. Anticipated functional outcomes upon discharge from inpatient rehab are modified independent  with PT, modified independent with OT, modified independent and supervision with SLP. 8. Estimated rehab length of stay to reach the above functional goals is: 7-10 days 9. Does the patient have adequate social supports and living environment to accommodate these discharge functional goals? Yes 10. Anticipated D/C setting: Home 11. Anticipated post D/C treatments: HH therapy and Outpatient therapy 12. Overall  Rehab/Functional Prognosis: excellent  RECOMMENDATIONS: This patient's condition is appropriate for continued rehabilitative care in the following setting: CIR Patient has agreed to participate in recommended program. Yes Note that insurance prior authorization may be required for reimbursement for recommended care.  Comment: Rehab Admissions Coordinator to follow up.  Thanks,  Meredith Staggers, MD, Mellody Drown     01/25/2015

## 2015-01-25 NOTE — Progress Notes (Signed)
Patient ID: Mandy Durham, female   DOB: December 25, 1945, 69 y.o.   MRN: [id number] Patient significantly confused this morning hallucinating and encephalopathic. She is wide-awake and nonfocal. She is convinced that she was on a ladder could not be redirected the fact that she was in an ICU bed. I think that this is a combination of a little bit of the sequela of her to moderate brain injury, probably significant component of potentially steroid-induced psychosis or ICU psychosis. We will continue to support her check a BMP check a urinalysis will appreciate internal medicine's recommendations in on rounds this morning. We'll recheck later on this morning.

## 2015-01-25 NOTE — Consult Note (Signed)
[place] TEAM 1 - Stepdown/ICU TEAM CONSULT F/U NOTE  Mandy Durham [id number] DOB: 1946-01-17 DOA: 01/21/2015 PCP: Kathlene November, MD  Admit HPI / Brief Narrative: 69 y.o. female w/ a history of Insomnia; Adenomatous polyp of colon; Hyperlipidemia; OA; Hypertension; Syncope; and LBBB who presented with a fall in her kitchen. Her husband heard her fall and found her unconscious. She was standing at the sink for about 3 min and felt lightheaded. She called out his name prior to hitting the floor. Patient fell backwards hitting her head. She has hx of orthostatic hypotension in the past. Her prior episodes of syncope have often occurred after prolonged standing. Patient in the past was treated with Florinef but failed therapy.  She was started on Mestinon 1 year ago and did well for a while although she still had occasional episodes of lightheadedness. Patient has been followed for this by Cardiology.  Her last echogram was 2014 showing EF of 50-55% with mild LVH and septal lateral dyssynchrony with the bundle-branch block and no significant arrhythmia noted on Holter monitor. Cardiology was worried about autonomic insufficiency. She reported good intake prior, and denied shortness of breath, chest pain,  leg swelling.  On Arrival to ER she was found to have 3 mm subarachnoid hemorrhage, and was admitted to the NS service.     HPI/Subjective: The patient has developed significant confusion over the last 48 hours.  She has been hallucinating and suffering with insomnia.  There've been no focal deficits.  The patient's nausea vomiting and severe headache however appear to have responded well to her high-dose steroid treatment.  Recommendations/Plan:  Traumatic SAH NS continues to follow this issue, now in a consulting role   Intractable nausea with positional vertigo concussion symptoms - trial of steroid initiated per NS to which the patient's symptoms responded well  ICU/steroid-induced  delirium Unfortunately the patient has developed acute delirium over the last 48 hours - this is coincident with the use of high-dose steroids - there are no focal and amount is to suggest a neurologic basis otherwise - steroids have been discontinued - transfer out of ICU setting and initiate low dose Seroquel tonight - extensive counseling with family carried out - f/u UA today - check 123456 and folic acid levels   Syncope and collapse - Orthostatic hypotension  No change in TTE compared to most recent prior - carotid dopplers w/o evidence of signif stenosis B - a recurring issue being followed by [facility] Cardiology as outpt - given the apparent extent of the prior outpatient workup in the chronic nature of this issue further testing as an inpatient is not likely to prove productive - patient should follow-up with her Cardiologist who has been monitoring this issue  Possible adrenal insufficiency AM cortisol very low at 1.8 11/10 - I do not see evidence in Epic but this has been previously checked - an ACTH stim test should be considered in the outpatient setting   Mild hypokalemia Resolved with supplementation  Essential hypertension Blood pressure trending up likely related to high-dose steroids - avoid overaggressive correction but desire for systolic closer to [id number] - adjust treatment and follow  Code Status: FULL Family Communication: Spoke with daughter at bedside Disposition:  Transfer to non-telemetry neuro bed - monitor mental status overnight - potential discharge 24-48 hours  Antibiotics: none  DVT prophylaxis: SCDs  Objective: Blood pressure 170/77, pulse 62, temperature 97.4 F (36.3 C), temperature source Oral, resp. rate 16, height 5' 5.5" (1.664 m), weight  51.71 kg (114 lb), SpO2 94 %.  Intake/Output Summary (Last 24 hours) at 01/25/15 0945 Last data filed at 01/25/15 0700  Gross per 24 hour  Intake   1923 ml  Output    325 ml  Net   1598 ml   Exam: General: No acute  respiratory distress - alert but confused Lungs: Clear to auscultation bilaterally without wheezes or crackles Cardiovascular: Regular rate and rhythm without murmur gallop or rub normal S1 and S2 Abdomen: Nontender, nondistended, soft, bowel sounds positive, no rebound, no ascites, no appreciable mass Extremities: No significant cyanosis, clubbing, edema bilateral lower extremities  Data Reviewed: Basic Metabolic Panel:  Recent Labs Lab 01/21/15 2038 01/22/15 1100 01/24/15 0330  NA 138 136 138  K 4.0 3.3* 4.4  CL 105 102 104  CO2 27 25 26   GLUCOSE 150* 125* 135*  BUN 13 11 10   CREATININE 0.70 0.76 0.67  CALCIUM 8.8* 8.8* 9.1  MG  --  1.9 1.8    CBC:  Recent Labs Lab 01/21/15 2038 01/22/15 1100  WBC 13.2* 11.1*  NEUTROABS  --  9.1*  HGB 12.8 13.1  HCT 39.0 39.5  MCV 85.2 85.9  PLT 304 307    Liver Function Tests:  Recent Labs Lab 01/22/15 1100  AST 27  ALT 21  ALKPHOS 73  BILITOT 0.5  PROT 6.5  ALBUMIN 3.2*   Cardiac Enzymes:  Recent Labs Lab 01/22/15 0441 01/22/15 1100 01/22/15 1600  TROPONINI <0.03 <0.03 <0.03    CBG:  Recent Labs Lab 01/21/15 1954  GLUCAP 141*    Recent Results (from the past 240 hour(s))  MRSA PCR Screening     Status: None   Collection Time: 01/22/15  4:48 AM  Result Value Ref Range Status   MRSA by PCR NEGATIVE NEGATIVE Final    Comment:        The GeneXpert MRSA Assay (FDA approved for NASAL specimens only), is one component of a comprehensive MRSA colonization surveillance program. It is not intended to diagnose MRSA infection nor to guide or monitor treatment for MRSA infections.      Studies:   Recent x-ray studies have been reviewed in detail by the Attending Physician  Scheduled Meds:  Scheduled Meds: . metoprolol tartrate  12.5 mg Oral BID  . pantoprazole  40 mg Oral BID  . PARoxetine  10 mg Oral Daily  . pyridostigmine  60 mg Oral TID  . senna-docusate  1 tablet Oral BID    Time spent  on care of this patient: 35 mins   Kiam Bransfield T , MD   [facility] Office  [phone] Pager - Text Page per Shea Evans as per below:  On-Call/Text Page:      [email or web address]      password [email or web address]  If 7PM-7AM, please contact night-coverage [email or web address] Password [email or web address] 01/25/2015, 9:45 AM   LOS: 3 days

## 2015-01-25 NOTE — Progress Notes (Signed)
Pt admitted to [facility] from ICU.  She is pleasant, alert and oriented to person, time, date but not place.  She has intermittent confusion secondary to steroid use per report and family.  She reports feeling as though she is falling while laying in the bed.  VSS. Family at bedside.  She has no dentures, or hearing aids, but has glasses with her.  Bed alarm on, call bell within reach, and family and patient verbalize understanding of not getting OOB without staff assistance. Will continue to monitor and offer support.

## 2015-01-26 DIAGNOSIS — S069X0A Unspecified intracranial injury without loss of consciousness, initial encounter: Secondary | ICD-10-CM

## 2015-01-26 DIAGNOSIS — R42 Dizziness and giddiness: Secondary | ICD-10-CM

## 2015-01-26 LAB — COMPREHENSIVE METABOLIC PANEL
ALBUMIN: 2.8 g/dL — AB (ref 3.5–5.0)
ALK PHOS: 60 U/L (ref 38–126)
ALT: 17 U/L (ref 14–54)
ANION GAP: 9 (ref 5–15)
AST: 22 U/L (ref 15–41)
BUN: 14 mg/dL (ref 6–20)
CHLORIDE: 102 mmol/L (ref 101–111)
CO2: 28 mmol/L (ref 22–32)
Calcium: 8.9 mg/dL (ref 8.9–10.3)
Creatinine, Ser: 0.73 mg/dL (ref 0.44–1.00)
GFR calc non Af Amer: >60 mL/min (ref >60)
GLUCOSE: 101 mg/dL — AB (ref 65–99)
Potassium: 3.9 mmol/L (ref 3.5–5.1)
SODIUM: 139 mmol/L (ref 135–145)
Total Bilirubin: 0.5 mg/dL (ref 0.3–1.2)
Total Protein: 6.1 g/dL — ABNORMAL LOW (ref 6.5–8.1)

## 2015-01-26 LAB — AMMONIA: Ammonia: 23 umol/L (ref 9–35)

## 2015-01-26 LAB — CORTISOL: CORTISOL PLASMA: 10.2 ug/dL

## 2015-01-26 NOTE — Progress Notes (Signed)
Patient ID: Mandy Durham, female   DOB: January 15, 1946, 69 y.o.   MRN: [id number] Patient looks really good today. She denies significant headache. She says it is mild. She is awake and alert and regards the examiner. She answers questions appropriately. She spell world backwards. She knows where she is and how long she has been here and she names the president and the president elect. She knows her age. Overall she seems to be doing better.

## 2015-01-26 NOTE — Evaluation (Signed)
Occupational Therapy Evaluation Patient Details Name: Mandy Durham MRN: [id number] DOB: 04-06-1945 Today's Date: 01/26/2015    History of Present Illness pt presents after fall sustaining R [place] SDH.  pt with hx of syncope, HTN, and Orthostatic hypotension.     Clinical Impression   This 69 yo female admitted with above presents to acute OT with decreased balance, decreased mobility, decreased safety awareness all affecting her PLOF of independent with basic ADLs and IADLs including driving. She will benefit from acute OT with follow up on CIR.    Follow Up Recommendations  CIR    Equipment Recommendations  3 in 1 bedside comode;Tub/shower seat       Precautions / Restrictions Precautions Precautions: Fall Restrictions Weight Bearing Restrictions: No      Mobility Bed Mobility Overal bed mobility: Needs Assistance Bed Mobility: Sit to Supine       Sit to supine: Supervision      Transfers Overall transfer level: Needs assistance Equipment used:  (pt held onto bed rail) Transfers: Sit to/from Stand Sit to Stand: Min guard         General transfer comment: Pt ambulated 100 feet with min A and no AD    Balance Overall balance assessment: Needs assistance;History of Falls Sitting-balance support: No upper extremity supported;Feet supported Sitting balance-Leahy Scale: Fair     Standing balance support: Single extremity supported;During functional activity Standing balance-Leahy Scale: Poor                              ADL Overall ADL's : Needs assistance/impaired Eating/Feeding: Independent;Sitting   Grooming: Wash/dry hands;Min guard;Standing   Upper Body Bathing: Set up;Sitting   Lower Body Bathing: Min guard;Sit to/from stand   Upper Body Dressing : Set up;Sitting   Lower Body Dressing: Min guard;Sit to/from stand   Toilet Transfer: Minimal assistance;Ambulation;Regular Toilet;Grab bars   Toileting- Clothing Manipulation and  Hygiene: Min guard;Sit to/from stand               Vision Additional Comments: Bifocals, reads a normal pace          Pertinent Vitals/Pain Pain Assessment: No/denies pain     Hand Dominance Right   Extremity/Trunk Assessment Upper Extremity Assessment Upper Extremity Assessment: Overall WFL for tasks assessed           Communication Communication Communication: No difficulties   Cognition Arousal/Alertness: Awake/alert Behavior During Therapy: Flat affect Overall Cognitive Status: Impaired/Different from baseline Area of Impairment: Following commands;Problem solving       Following Commands: Follows one step commands consistently     Problem Solving: Slow processing;Difficulty sequencing;Requires verbal cues;Requires tactile cues                Home Living Family/patient expects to be discharged to:: Inpatient rehab Living Arrangements: Spouse/significant other Available Help at Discharge: Family;Available 24 hours/day               Bathroom Shower/Tub: Chief Strategy Officer: None          Prior Functioning/Environment Level of Independence: Independent        Comments: including driving and IADLs    OT Diagnosis: Generalized weakness;Cognitive deficits   OT Problem List: Decreased strength;Impaired balance (sitting and/or standing);Decreased cognition;Decreased knowledge of use of DME or AE   OT Treatment/Interventions: Self-care/ADL training;Patient/family education;Balance training;DME and/or AE instruction;Cognitive remediation/compensation    OT Goals(Current goals can  be found in the care plan section) Acute Rehab OT Goals Patient Stated Goal: to go home and be able to drive OT Goal Formulation: With patient/family Time For Goal Achievement: 02/02/15 Potential to Achieve Goals: Good  OT Frequency: Min 3X/week              End of Session Equipment Utilized During Treatment: Gait belt Nurse  Communication:  (Pt incontient of stool, family to bring in Depends. Pt up in recliner without alarm with family present and they are to let nursing when they leave)  Activity Tolerance: Patient tolerated treatment well Patient left: in chair;with call bell/phone within reach;with family/visitor present (family knows that while pt in recliner to not leave her alone unless they notify nursing)   Time: [id number] OT Time Calculation (min): 45 min Charges:  OT General Charges $OT Visit: 1 Procedure OT Evaluation $Initial OT Evaluation Tier I: 1 Procedure OT Treatments $Self Care/Home Management : 23-37 mins   Almon Register [id number] 01/26/2015, 2:48 PM

## 2015-01-26 NOTE — Consult Note (Signed)
[facility] TEAM 1 - Stepdown/ICU TEAM CONSULT F/U NOTE  Mandy Durham [id number] DOB: January 26, 1946 DOA: 01/21/2015 PCP: Kathlene November, MD  Admit HPI / Brief Narrative: 69 y.o. female w/ a history of Insomnia; Adenomatous polyp of colon; Hyperlipidemia; OA; Hypertension; Syncope; and LBBB who presented with a fall in her kitchen. Her husband heard her fall and found her unconscious. She was standing at the sink for about 3 min and felt lightheaded. She called out his name prior to hitting the floor. Patient fell backwards hitting her head. She has hx of orthostatic hypotension in the past. Her prior episodes of syncope have often occurred after prolonged standing. Patient in the past was treated with Florinef but failed therapy.  She was started on Mestinon 1 year ago and did well for a while although she still had occasional episodes of lightheadedness. Patient has been followed for this by Cardiology.  Her last echogram was 2014 showing EF of 50-55% with mild LVH and septal lateral dyssynchrony with the bundle-branch block and no significant arrhythmia noted on Holter monitor. Cardiology was worried about autonomic insufficiency. She reported good intake prior, and denied shortness of breath, chest pain,  leg swelling.  On Arrival to ER she was found to have 3 mm subarachnoid hemorrhage, and was admitted to the NS service.     HPI/Subjective: The patient is much more alert and conversant today.  Her husband is at the bedside and agrees that she is much improved today.  She is oriented and pleasant.  She denies chest pain fevers chills or vomiting.  She admits to only modest nausea with positional changes.  Recommendations/Plan:  Traumatic SAH NS continues to follow this issue, now in a consulting role   Intractable nausea with positional vertigo concussion symptoms - trial of steroid initiated per NS to which the patient's symptoms responded well - continues to improve  steadily  ICU/steroid-induced delirium Much improved at this time - no further evaluation indicated  Syncope and collapse - Orthostatic hypotension  No change in TTE compared to most recent prior - carotid dopplers w/o evidence of signif stenosis B - a recurring issue being followed by [facility] as outpt - given the apparent extent of the prior outpatient workup in the chronic nature of this issue further testing as an inpatient is not likely to prove productive - patient should follow-up with her Cardiologist who has been monitoring this issue  Possible adrenal insufficiency AM cortisol very low at 1.8 11/10 - I do not see evidence in Epic but this has been previously checked - further evaluation into this should be considered in the outpatient setting far removed from recent steroid dosing  Mild hypokalemia Resolved with supplementation  Essential hypertension Blood pressure trending up likely related to high-dose steroids - avoid overaggressive correction but desire for systolic closer to [id number] - no change in treatment plan today  Code Status: FULL Family Communication: Spoke with husband at bedside Disposition:  Continue to ambulate  - work with PT/OT - for transfer to CIR Monday  Antibiotics: none  DVT prophylaxis: SCDs  Objective: Blood pressure 145/74, pulse 64, temperature 98.2 F (36.8 C), temperature source Oral, resp. rate 16, height 5' 5.5" (1.664 m), weight 51.71 kg (114 lb), SpO2 94 %. No intake or output data in the 24 hours ending 01/26/15 1658   Exam: General: No acute respiratory distress - alert and less confused Lungs: Clear to auscultation bilaterally  Cardiovascular: Regular rate and rhythm without murmur Abdomen: Nontender,  nondistended, soft, bowel sounds positive, no rebound, no ascites, no appreciable mass Extremities: No significant cyanosis, clubbing, or edema bilateral lower extremities  Data Reviewed: Basic Metabolic Panel:  Recent Labs Lab  01/21/15 2038 01/22/15 1100 01/24/15 0330 01/25/15 0924 01/26/15 0321  NA 138 136 138 138 139  K 4.0 3.3* 4.4 3.3* 3.9  CL 105 102 104 106 102  CO2 27 25 26 24 28   GLUCOSE 150* 125* 135* 101* 101*  BUN 13 11 10 11 14   CREATININE 0.70 0.76 0.67 0.61 0.73  CALCIUM 8.8* 8.8* 9.1 8.6* 8.9  MG  --  1.9 1.8  --   --     CBC:  Recent Labs Lab 01/21/15 2038 01/22/15 1100  WBC 13.2* 11.1*  NEUTROABS  --  9.1*  HGB 12.8 13.1  HCT 39.0 39.5  MCV 85.2 85.9  PLT 304 307    Liver Function Tests:  Recent Labs Lab 01/22/15 1100 01/26/15 0321  AST 27 22  ALT 21 17  ALKPHOS 73 60  BILITOT 0.5 0.5  PROT 6.5 6.1*  ALBUMIN 3.2* 2.8*   Cardiac Enzymes:  Recent Labs Lab 01/22/15 0441 01/22/15 1100 01/22/15 1600  TROPONINI <0.03 <0.03 <0.03    CBG:  Recent Labs Lab 01/21/15 1954  GLUCAP 141*    Recent Results (from the past 240 hour(s))  MRSA PCR Screening     Status: None   Collection Time: 01/22/15  4:48 AM  Result Value Ref Range Status   MRSA by PCR NEGATIVE NEGATIVE Final    Comment:        The GeneXpert MRSA Assay (FDA approved for NASAL specimens only), is one component of a comprehensive MRSA colonization surveillance program. It is not intended to diagnose MRSA infection nor to guide or monitor treatment for MRSA infections.      Studies:   Recent x-ray studies have been reviewed in detail by the Attending Physician  Scheduled Meds:  Scheduled Meds: . metoprolol tartrate  25 mg Oral BID  . pantoprazole  40 mg Oral BID  . PARoxetine  10 mg Oral Daily  . pyridostigmine  60 mg Oral TID  . QUEtiapine  12.5 mg Oral QHS  . senna-docusate  1 tablet Oral BID    Time spent on care of this patient: 25 mins   [facility] T , MD   [facility] Office  [phone] Pager - Text Page per Shea Evans as per below:  On-Call/Text Page:      [email or web address]      password [email or web address]  If 7PM-7AM, please contact night-coverage [email or web address] Password  [email or web address] 01/26/2015, 4:58 PM   LOS: 4 days

## 2015-01-27 MED ORDER — OXYCODONE HCL 5 MG PO TABS: 5.0000 mg | ORAL_TABLET | ORAL | Status: DC | PRN

## 2015-01-27 MED ORDER — HYDROCODONE-ACETAMINOPHEN 5-325 MG PO TABS
1.0000 | ORAL_TABLET | ORAL | Status: DC | PRN
  Administered 2015-01-27 – 2015-01-28 (×4): 1 via ORAL
  Filled 2015-01-27: qty 2
  Filled 2015-01-27 (×4): qty 1

## 2015-01-27 NOTE — Consult Note (Signed)
[name] - Stepdown/ICU TEAM CONSULT F/U NOTE  Mandy Durham [id number] DOB: 1945-08-12 DOA: 01/21/2015 PCP: Mandy November, MD  Admit HPI / Brief Narrative: 69 y.o. female w/ a history of Insomnia; Adenomatous polyp of colon; Hyperlipidemia; OA; Hypertension; Syncope; and LBBB who presented with a fall in her kitchen. Her husband heard her fall and found her unconscious. She was standing at the sink for about 3 min and felt lightheaded. She called out his name prior to hitting the floor. Patient fell backwards hitting her head. She has hx of orthostatic hypotension in the past. Her prior episodes of syncope have often occurred after prolonged standing. Patient in the past was treated with Florinef but failed therapy.  She was started on Mestinon 1 year ago and did well for a while although she still had occasional episodes of lightheadedness. Patient has been followed for this by Cardiology.  Her last echogram was 2014 showing EF of 50-55% with mild LVH and septal lateral dyssynchrony with the bundle-branch block and no significant arrhythmia noted on Holter monitor. Cardiology was worried about autonomic insufficiency. She reported good intake prior, and denied shortness of breath, chest pain,  leg swelling.  On Arrival to ER she was found to have 3 mm subarachnoid hemorrhage, and was admitted to the NS service.     HPI/Subjective: No new complaints today.  Some nausea does persist.  Awaiting possible rehabilitation bed.  Recommendations/Plan:  Traumatic SAH NS continues to follow this issue, now in a consulting role   Intractable nausea with positional vertigo concussion symptoms - trial of steroid initiated per NS to which the patient's symptoms responded well - continues to improve steadily  ICU/steroid-induced delirium Much improved at this time - no further evaluation indicated  Syncope and collapse - Orthostatic hypotension  No change in TTE compared to most recent prior -  carotid dopplers w/o evidence of signif stenosis B - a recurring issue being followed by [facility] as outpt - given the apparent extent of the prior outpatient workup in the chronic nature of this issue further testing as an inpatient is not likely to prove productive - patient should follow-up with her Cardiologist who has been monitoring this issue  Possible adrenal insufficiency AM cortisol very low at 1.8 11/10 but a recheck normal 11/12 - I do not see evidence in Epic that this has been previously checked - further evaluation into this should be considered in the outpatient setting far removed from recent steroid dosing  Mild hypokalemia Resolved with supplementation  Essential hypertension no change in treatment plan today  Code Status: FULL Family Communication: Spoke with husband at bedside Disposition:  Continue to ambulate  - work with PT/OT - for transfer to CIR Monday  Antibiotics: none  DVT prophylaxis: SCDs  Objective: Blood pressure 144/71, pulse 62, temperature 98 F (36.7 C), temperature source Oral, resp. rate 16, height 5' 5.5" (1.664 m), weight 51.71 kg (114 lb), SpO2 96 %. No intake or output data in the 24 hours ending 01/27/15 1825   Exam: General: No acute respiratory distress - alert  Lungs: Clear to auscultation bilaterally  Cardiovascular: Regular rate and rhythm  Abdomen: Nontender, nondistended, soft, bowel sounds positive, no rebound, no ascites, no appreciable mass Extremities: No significant cyanosis, clubbing, edema bilateral lower extremities  Data Reviewed: Basic Metabolic Panel:  Recent Labs Lab 01/21/15 2038 01/22/15 1100 01/24/15 0330 01/25/15 0924 01/26/15 0321  NA 138 136 138 138 139  K 4.0 3.3* 4.4 3.3*  3.9  CL 105 102 104 106 102  CO2 27 25 26 24 28   GLUCOSE 150* 125* 135* 101* 101*  BUN 13 11 10 11 14   CREATININE 0.70 0.76 0.67 0.61 0.73  CALCIUM 8.8* 8.8* 9.1 8.6* 8.9  MG  --  1.9 1.8  --   --     CBC:  Recent  Labs Lab 01/21/15 2038 01/22/15 1100  WBC 13.2* 11.1*  NEUTROABS  --  9.1*  HGB 12.8 13.1  HCT 39.0 39.5  MCV 85.2 85.9  PLT 304 307    Liver Function Tests:  Recent Labs Lab 01/22/15 1100 01/26/15 0321  AST 27 22  ALT 21 17  ALKPHOS 73 60  BILITOT 0.5 0.5  PROT 6.5 6.1*  ALBUMIN 3.2* 2.8*   Cardiac Enzymes:  Recent Labs Lab 01/22/15 0441 01/22/15 1100 01/22/15 1600  TROPONINI <0.03 <0.03 <0.03    CBG:  Recent Labs Lab 01/21/15 1954  GLUCAP 141*    Recent Results (from the past 240 hour(s))  MRSA PCR Screening     Status: None   Collection Time: 01/22/15  4:48 AM  Result Value Ref Range Status   MRSA by PCR NEGATIVE NEGATIVE Final    Comment:        The GeneXpert MRSA Assay (FDA approved for NASAL specimens only), is one component of a comprehensive MRSA colonization surveillance program. It is not intended to diagnose MRSA infection nor to guide or monitor treatment for MRSA infections.      Studies:   Recent x-ray studies have been reviewed in detail by the Attending Physician  Scheduled Meds:  Scheduled Meds: . metoprolol tartrate  25 mg Oral BID  . PARoxetine  10 mg Oral Daily  . pyridostigmine  60 mg Oral TID  . QUEtiapine  12.5 mg Oral QHS  . senna-docusate  1 tablet Oral BID    Time spent on care of this patient: 15 mins   Nan Maya T , MD   [facility] Office  [phone] Pager - Text Page per Shea Evans as per below:  On-Call/Text Page:      [email or web address]      password [email or web address]  If 7PM-7AM, please contact night-coverage [email or web address] Password [email or web address] 01/27/2015, 6:25 PM   LOS: 5 days

## 2015-01-27 NOTE — Progress Notes (Signed)
Patient ID: Mandy Durham, female   DOB: [birth year], 69 y.o.   MRN: [id number] BP 125/56 mmHg  Pulse 63  Temp(Src) 98.1 F (36.7 C) (Oral)  Resp 16  Ht 5' 5.5" (1.664 m)  Wt 51.71 kg (114 lb)  BMI 18.68 kg/m2  SpO2 96% Alert and oriented x 4 Speech is clear, fluent Moving all extremities well Continues to improve.

## 2015-01-28 ENCOUNTER — Inpatient Hospital Stay (HOSPITAL_COMMUNITY)
Admission: RE | Admit: 2015-01-28 | Discharge: 2015-02-05 | DRG: 561 | Disposition: A | Discharge status: Home / Self Care | Payer: Medicare HMO | Source: Intra-hospital | Attending: Physical Medicine & Rehabilitation | Admitting: Physical Medicine & Rehabilitation

## 2015-01-28 ENCOUNTER — Encounter (HOSPITAL_COMMUNITY): Payer: Self-pay | Admitting: *Deleted

## 2015-01-28 DIAGNOSIS — G44319 Acute post-traumatic headache, not intractable: Secondary | ICD-10-CM | POA: Diagnosis not present

## 2015-01-28 DIAGNOSIS — G44309 Post-traumatic headache, unspecified, not intractable: Secondary | ICD-10-CM | POA: Diagnosis not present

## 2015-01-28 DIAGNOSIS — S02119D Unspecified fracture of occiput, subsequent encounter for fracture with routine healing: Secondary | ICD-10-CM | POA: Diagnosis present

## 2015-01-28 DIAGNOSIS — E785 Hyperlipidemia, unspecified: Secondary | ICD-10-CM | POA: Diagnosis not present

## 2015-01-28 DIAGNOSIS — H8110 Benign paroxysmal vertigo, unspecified ear: Secondary | ICD-10-CM

## 2015-01-28 DIAGNOSIS — Z79899 Other long term (current) drug therapy: Secondary | ICD-10-CM | POA: Diagnosis not present

## 2015-01-28 DIAGNOSIS — G4700 Insomnia, unspecified: Secondary | ICD-10-CM | POA: Diagnosis not present

## 2015-01-28 DIAGNOSIS — R42 Dizziness and giddiness: Secondary | ICD-10-CM

## 2015-01-28 DIAGNOSIS — S066X0D Traumatic subarachnoid hemorrhage without loss of consciousness, subsequent encounter: Secondary | ICD-10-CM

## 2015-01-28 DIAGNOSIS — F419 Anxiety disorder, unspecified: Secondary | ICD-10-CM

## 2015-01-28 DIAGNOSIS — I447 Left bundle-branch block, unspecified: Secondary | ICD-10-CM | POA: Diagnosis not present

## 2015-01-28 DIAGNOSIS — R4781 Slurred speech: Secondary | ICD-10-CM | POA: Diagnosis not present

## 2015-01-28 DIAGNOSIS — I10 Essential (primary) hypertension: Secondary | ICD-10-CM | POA: Diagnosis not present

## 2015-01-28 DIAGNOSIS — E778 Other disorders of glycoprotein metabolism: Secondary | ICD-10-CM

## 2015-01-28 DIAGNOSIS — S069X1S Unspecified intracranial injury with loss of consciousness of 30 minutes or less, sequela: Secondary | ICD-10-CM

## 2015-01-28 DIAGNOSIS — W19XXXD Unspecified fall, subsequent encounter: Secondary | ICD-10-CM

## 2015-01-28 DIAGNOSIS — I951 Orthostatic hypotension: Secondary | ICD-10-CM | POA: Diagnosis not present

## 2015-01-28 DIAGNOSIS — I609 Nontraumatic subarachnoid hemorrhage, unspecified: Secondary | ICD-10-CM

## 2015-01-28 DIAGNOSIS — R4189 Other symptoms and signs involving cognitive functions and awareness: Secondary | ICD-10-CM

## 2015-01-28 DIAGNOSIS — S069X3S Unspecified intracranial injury with loss of consciousness of 1 hour to 5 hours 59 minutes, sequela: Secondary | ICD-10-CM | POA: Diagnosis not present

## 2015-01-28 DIAGNOSIS — S069X9A Unspecified intracranial injury with loss of consciousness of unspecified duration, initial encounter: Secondary | ICD-10-CM | POA: Diagnosis present

## 2015-01-28 DIAGNOSIS — S069X9S Unspecified intracranial injury with loss of consciousness of unspecified duration, sequela: Secondary | ICD-10-CM

## 2015-01-28 DIAGNOSIS — S062X0D Diffuse traumatic brain injury without loss of consciousness, subsequent encounter: Secondary | ICD-10-CM | POA: Diagnosis not present

## 2015-01-28 DIAGNOSIS — E639 Nutritional deficiency, unspecified: Secondary | ICD-10-CM | POA: Diagnosis not present

## 2015-01-28 DIAGNOSIS — S069XAA Unspecified intracranial injury with loss of consciousness status unknown, initial encounter: Secondary | ICD-10-CM | POA: Diagnosis present

## 2015-01-28 MED ORDER — GUAIFENESIN-DM 100-10 MG/5ML PO SYRP: 5.0000 mL | ORAL_SOLUTION | Freq: Four times a day (QID) | ORAL | Status: DC | PRN

## 2015-01-28 MED ORDER — DIPHENHYDRAMINE HCL 12.5 MG/5ML PO ELIX: 12.5000 mg | ORAL_SOLUTION | Freq: Four times a day (QID) | ORAL | Status: DC | PRN

## 2015-01-28 MED ORDER — SENNOSIDES-DOCUSATE SODIUM 8.6-50 MG PO TABS
1.0000 | ORAL_TABLET | Freq: Two times a day (BID) | ORAL | Status: DC
  Administered 2015-01-30 – 2015-02-05 (×7): 1 via ORAL
  Filled 2015-01-28 (×16): qty 1

## 2015-01-28 MED ORDER — PAROXETINE HCL 10 MG PO TABS
10.0000 mg | ORAL_TABLET | Freq: Every day | ORAL | Status: DC
  Administered 2015-01-29 – 2015-02-05 (×8): 10 mg via ORAL
  Filled 2015-01-28 (×11): qty 1

## 2015-01-28 MED ORDER — FLEET ENEMA 7-19 GM/118ML RE ENEM: 1.0000 | ENEMA | Freq: Once | RECTAL | Status: DC | PRN

## 2015-01-28 MED ORDER — OXYCODONE HCL 5 MG PO TABS
5.0000 mg | ORAL_TABLET | ORAL | Status: DC | PRN
  Administered 2015-01-28 – 2015-01-29 (×2): 10 mg via ORAL
  Filled 2015-01-28 (×2): qty 2

## 2015-01-28 MED ORDER — ONDANSETRON HCL 4 MG PO TABS
4.0000 mg | ORAL_TABLET | Freq: Four times a day (QID) | ORAL | Status: DC | PRN
  Administered 2015-02-02: 4 mg via ORAL
  Filled 2015-01-28: qty 1

## 2015-01-28 MED ORDER — QUETIAPINE FUMARATE 25 MG PO TABS
12.5000 mg | ORAL_TABLET | Freq: Every day | ORAL | Status: DC
Start: 2015-01-28 — End: 2015-01-30
  Administered 2015-01-28 – 2015-01-29 (×2): 12.5 mg via ORAL
  Filled 2015-01-28 (×2): qty 1

## 2015-01-28 MED ORDER — BISACODYL 10 MG RE SUPP: 10.0000 mg | Freq: Every day | RECTAL | Status: DC | PRN

## 2015-01-28 MED ORDER — ALUM & MAG HYDROXIDE-SIMETH 200-200-20 MG/5ML PO SUSP: 30.0000 mL | ORAL | Status: DC | PRN

## 2015-01-28 MED ORDER — METOPROLOL TARTRATE 25 MG PO TABS
25.0000 mg | ORAL_TABLET | Freq: Two times a day (BID) | ORAL | Status: DC
  Administered 2015-01-28 – 2015-02-05 (×16): 25 mg via ORAL
  Filled 2015-01-28 (×16): qty 1

## 2015-01-28 MED ORDER — PYRIDOSTIGMINE BROMIDE 60 MG PO TABS
60.0000 mg | ORAL_TABLET | Freq: Three times a day (TID) | ORAL | Status: DC
  Administered 2015-01-28 – 2015-02-05 (×21): 60 mg via ORAL
  Filled 2015-01-28 (×27): qty 1

## 2015-01-28 MED ORDER — ENSURE ENLIVE PO LIQD
237.0000 mL | Freq: Two times a day (BID) | ORAL | Status: DC
  Administered 2015-01-29 – 2015-02-03 (×8): 237 mL via ORAL

## 2015-01-28 MED ORDER — ONDANSETRON HCL 4 MG/2ML IJ SOLN: 4.0000 mg | Freq: Four times a day (QID) | INTRAMUSCULAR | Status: DC | PRN

## 2015-01-28 MED ORDER — ACETAMINOPHEN 325 MG PO TABS
325.0000 mg | ORAL_TABLET | ORAL | Status: DC | PRN
  Administered 2015-01-29 – 2015-01-30 (×2): 650 mg via ORAL
  Administered 2015-02-02: 325 mg via ORAL
  Filled 2015-01-28 (×5): qty 2

## 2015-01-28 NOTE — Interval H&P Note (Signed)
Mandy Durham was admitted today to Inpatient Rehabilitation with the diagnosis of traumatic right frontal hemorrhagic contusion, occipital skull fracture and SAH..  The patient's history has been reviewed, patient examined, and there is no change in status.  Patient continues to be appropriate for intensive inpatient rehabilitation.  I have reviewed the patient's chart and labs.  Questions were answered to the patient's satisfaction. The PAPE has been reviewed and assessment remains appropriate.  Mandy Durham [name] 01/28/2015, 9:00 PM

## 2015-01-28 NOTE — Clinical Social Work Placement (Signed)
   CLINICAL SOCIAL WORK PLACEMENT  NOTE  Date:  01/28/2015  Patient Details  Name: Mandy Durham MRN: [id number] Date of Birth: 1945/08/01  Clinical Social Work is seeking post-discharge placement for this patient at the [place] level of care (*CSW will initial, date and re-position this form in  chart as items are completed):  Yes   Patient/family provided with [facility] Work Department's list of facilities offering this level of care within the geographic area requested by the patient (or if unable, by the patient's family).  Yes   Patient/family informed of their freedom to choose among providers that offer the needed level of care, that participate in Medicare, Medicaid or managed care program needed by the patient, have an available bed and are willing to accept the patient.  Yes   Patient/family informed of [facility]'s ownership interest in [facility] and [facility], as well as of the fact that they are under no obligation to receive care at these facilities.  PASRR submitted to EDS on       PASRR number received on       Existing PASRR number confirmed on       FL2 transmitted to all facilities in geographic area requested by pt/family on 01/28/15     FL2 transmitted to all facilities within larger geographic area on       Patient informed that his/her managed care company has contracts with or will negotiate with certain facilities, including the following:            Patient/family informed of bed offers received.  Patient chooses bed at       Physician recommends and patient chooses bed at      Patient to be transferred to   on  .  Patient to be transferred to facility by       Patient family notified on   of transfer.  Name of family member notified:        PHYSICIAN       Additional Comment:    _______________________________________________ Leane Call, Student-SW 01/28/2015, 10:55 AM

## 2015-01-28 NOTE — H&P (Signed)
Physical Medicine and Rehabilitation Admission H&P    Chief Complaint  Patient presents with  . TBI with right frontal hemorrhagic contusion, occipital skull fracture and SAH.    HPI: Mandy Durham is a 69 y.o. right handed female with history of HTN, anxiety, LBBB, syncope and presyncope who was admitted on 01/21/2015 after striking her head due to fall with amnesia of events and complaints of HA.  Patient has history of orthostatic hypotension that had improved on mestinon.  She was evaluated in ED and CT of the head revealed inferior right frontal hemorrhagic contusion, non-depressed occipital skull fracture, and moderate SAH around R-sylvian fissure. CTA head negative for aneurysm, occlusion or stenosis. Dr. Saintclair Halsted evaluated patient and recommended conservative management with serial CCT.  Cardiac echo done revealing EF 50-55% with anteroseptal hypokinesis, calcified MV and grade 1 diastolic dysfunction--no change since last study. Carotid dopplers without significant ICA stenosis.  Last CCT with increase in edema and size of right frontal hematoma and was treated with short course of steroids. She has had confusion that's resolving with addition of Seroquel.  She continues to have unsteady gait with nausea and vertigo due to BPPV. CIR recommended by MD and rehab team.     Review of Systems  HENT: Negative for hearing loss.   Eyes: Positive for double vision (on and off).  Respiratory: Negative for cough and shortness of breath.   Cardiovascular: Negative for chest pain, palpitations and leg swelling.  Gastrointestinal: Positive for nausea. Negative for vomiting and abdominal pain.  Genitourinary: Negative for dysuria, urgency and frequency.  Musculoskeletal: Negative for back pain, falls and neck pain.  Neurological: Positive for dizziness, sensory change (bilateral feet), speech change (voice softer per husband) and headaches.  Psychiatric/Behavioral: The patient has insomnia.    Anxiety  All other systems reviewed and are negative.     Past Medical History  Diagnosis Date  . Insomnia   . Adenomatous polyp of colon   . Overweight(278.02)   . Symptomatic menopausal or female climacteric states   . Hyperlipidemia   . Osteoarthrosis, unspecified whether generalized or localized, unspecified site   . Anxiety state, unspecified   . Hypertension   . Syncope and collapse     11/12: Holter 12/12 with rare PACS, HR range 64-140, average 82, no significant arrhythmias. Echo (1/13): EF 50-55%, mild LVH, septal-lateral dyssynchrony c/w LBBB. 3-week event monitor (1/13): No significant arrhythmia.   Marland Kitchen LBBB (left bundle branch block)     LHC in 2002 showed normal coronaries.     Past Surgical History  Procedure Laterality Date  . Tubal ligation      Family History  Problem Relation Age of Onset  . Heart failure Mother   . Coronary artery disease Mother   . Dementia Mother   . Diabetes Mother   . Colon cancer Neg Hx   . Breast cancer Neg Hx   . Hypertension Neg Hx   . Heart disease Neg Hx   . Heart attack Neg Hx   . Stroke Neg Hx     Social History:  Married. Independent and able to manage home. Does not use AD.  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 She reports that she has never smoked. She has never used smokeless tobacco. She reports that she does not drink alcohol or use illicit drugs.    Allergies: No Known Allergies    Medications Prior to Admission  Medication Sig Dispense Refill  . ALPRAZolam Duanne Moron)  0.25 MG tablet Take 1-2 tablets (0.25-0.5 mg total) by mouth at bedtime as needed for sleep. 60 tablet 1  . calcium carbonate (OS-CAL) 600 MG TABS tablet Take 1,200 mg by mouth daily with breakfast.    . cholecalciferol (VITAMIN D) 1000 UNITS tablet Take 2,000 Units by mouth daily.     . metoprolol tartrate (LOPRESSOR) 25 MG tablet Take 1 tablet (25 mg total) by mouth 2 (two) times daily. 180 tablet 3  . PARoxetine (PAXIL) 10 MG tablet  Take 10 mg by mouth daily.    Marland Kitchen pyridostigmine (MESTINON) 60 MG tablet Take 1 tablet (60 mg total) by mouth 3 (three) times daily. 135 tablet 3  . hydrocortisone 2.5 % cream Apply topically 2 (two) times daily. (Patient not taking: Reported on 01/21/2015) 30 g 0    Home: Home Living Family/patient expects to be discharged to:: Inpatient rehab Living Arrangements: Spouse/significant other Available Help at Discharge: Family, Available 24 hours/day Bathroom Shower/Tub: Insurance claims handler: None   Functional History: Prior Function Level of Independence: Independent Comments: including driving and IADLs  Functional Status:  Mobility: Bed Mobility Overal bed mobility: Needs Assistance Bed Mobility: Rolling, Sidelying to Sit Rolling: Min assist Sidelying to sit: Mod assist Sit to supine: Supervision General bed mobility comments: vc for sequencing; incr time; assist due to vertigo and fear of falling (rolling); assist to sit due to imbalance Transfers Overall transfer level: Needs assistance Equipment used: 1 person hand held assist Transfers: Sit to/from Stand Sit to Stand: Min assist General transfer comment: feaf of falling; moves slowly Ambulation/Gait Ambulation/Gait assistance: Mod assist, Min assist Ambulation Distance (Feet): 10 Feet Assistive device: 1 person hand held assist Gait Pattern/deviations: Step-through pattern, Decreased stride length, Shuffle, Leaning posteriorly, Wide base of support General Gait Details: initial steps mod assist due to imbalance; progressed to min assist, however very wide BOS and shuffling steps Gait velocity: very slwo    ADL: ADL Overall ADL's : Needs assistance/impaired Eating/Feeding: Independent, Sitting Grooming: Wash/dry hands, Min guard, Standing Upper Body Bathing: Set up, Sitting Lower Body Bathing: Min guard, Sit to/from stand Upper Body Dressing : Set up, Sitting Lower Body Dressing: Min guard, Sit to/from  stand Toilet Transfer: Minimal assistance, Ambulation, Regular Toilet, Grab bars Toileting- Clothing Manipulation and Hygiene: Min guard, Sit to/from stand  Cognition: Cognition Overall Cognitive Status: Impaired/Different from baseline Orientation Level: Oriented X4 Cognition Arousal/Alertness: Awake/alert Behavior During Therapy: Flat affect Overall Cognitive Status: Impaired/Different from baseline Area of Impairment: Problem solving Orientation Level: Disoriented to, Time, Situation Current Attention Level: Sustained Memory: Decreased recall of precautions, Decreased short-term memory Following Commands: Follows one step commands with increased time Safety/Judgement: Decreased awareness of safety, Decreased awareness of deficits Awareness: Intellectual Problem Solving: Slow processing, Difficulty sequencing, Requires verbal cues, Requires tactile cues General Comments: required repetition of instructions (multiple times) during mobility   Physical Exam: Blood pressure 153/60, pulse 62, temperature 98.3 F (36.8 C), temperature source Oral, resp. rate 20, height 5' 5.5" (1.664 m), weight 51.71 kg (114 lb), SpO2 98 %. Physical Exam  Nursing note and vitals reviewed. Constitutional: She is oriented to person, place, and time. She appears well-developed and well-nourished. No distress.  HENT:  Head: Normocephalic.  Tender, dry crusted bloody area on occiput.   Eyes: Conjunctivae and EOM are normal. Pupils are equal, round, and reactive to light.  Neck: Normal range of motion. Neck supple.  Cardiovascular: Normal rate and regular rhythm.   Respiratory: Effort normal and breath sounds normal. No respiratory  distress. She has no wheezes.  GI: Soft. Bowel sounds are normal. She exhibits no distension. There is no tenderness.  Musculoskeletal: She exhibits no edema or tenderness.  Strength b/l UE 4+/5 proximal to distal  B/l LE: 4/5 proximally, 4+/5 distally  Neurological: She is  alert and oriented to person, place, and time. Coordination (BUE) abnormal.  Soft voice without dysarthria. Low volume due to discomfort and reports intermittent diplopia. She is able to follow one and two step commands without difficulty.    Skin: Skin is warm and dry. No rash noted. She is not diaphoretic. No erythema.  Psychiatric: Her behavior is normal. Thought content normal.  Flat affect    No results found for this or any previous visit (from the past 48 hour(s)). No results found.   Medical Problem List and Plan: 1. Functional deficits secondary to traumatic right frontal hemorrhagic contusion, occipital skull fracture and SAH  Fall precautions; pt at risk for second impact syndrome  Avoid medications that could impair cognitive abilities, such as anticholinergics, antihistaminic, benzodiazapines, narcotics, etc when possible 2.  DVT Prophylaxis/Anticoagulation: Mechanical: Sequential compression devices, below knee Bilateral lower extremities and increase in mobility/activity level.  3. Pain Management: Reports prn hydrocodone effective.  4. Mood: continue Paxil.  LCSW to follow for evaluation and support.  5. Neuropsych: This patient is capable of making decisions on  her own behalf. 6. Skin/Wound Care: Routine pressure relief measures. Able to reposition independently 7. Fluids/Electrolytes/Nutrition: Monitor I/O. Check lytes in am. 8. LBBB/Supine HTN: On metoprolol bid. Mestonin to help with orthostatic changes as florinef ineffective.  9. Insomnia: Used Xanax for sleep at home. Seroquel is effective.  10. Low protein stores: Add supplement   Post Admission Physician Evaluation: 1. Functional deficits secondary to traumatic right frontal hemorrhagic contusion, occipital skull fracture and SAH. 2. Patient is admitted to receive collaborative, interdisciplinary care between the physiatrist, rehab nursing staff, and therapy team. 3. Patient's level of medical complexity and  substantial therapy needs in context of that medical necessity cannot be provided at a lesser intensity of care such as a SNF. 4. Patient has experienced substantial functional loss from his/her baseline which was documented above under the "Functional History" and "Functional Status" headings.  Judging by the patient's diagnosis, physical exam, and functional history, the patient has potential for functional progress which will result in measurable gains while on inpatient rehab.  These gains will be of substantial and practical use upon discharge  in facilitating mobility and self-care at the household level. 5. Physiatrist will provide 24 hour management of medical needs as well as oversight of the therapy plan/treatment and provide guidance as appropriate regarding the interaction of the two. 6. 24 hour rehab nursing will assist with bladder management, bowel management, safety, skin/wound care, disease management, medication administration, pain management and patient education  and help integrate therapy concepts, techniques,education, etc. 7. PT will assess and treat for/with: Lower extremity strength, range of motion, stamina, balance, functional mobility, safety, adaptive techniques and equipment, woundcare, coping skills, pain control, brain injury education.   Goals are: Mod I. 8. OT will assess and treat for/with: ADL's, functional mobility, safety, upper extremity strength, adaptive techniques and equipment, wound mgt, ego support, and community reintegration.   Goals are: Mod I. Therapy may proceed with showering this patient. 9. SLP will assess and treat for/with: memory, cognition, higher level processing.  Goals are: Supervision/Mod I. 10. Case Management and Social Worker will assess and treat for psychological issues and discharge planning.  11. Team conference will be held weekly to assess progress toward goals and to determine barriers to discharge. 12. Patient will receive at least 3  hours of therapy per day at least 5 days per week. 13. ELOS: 7-10 days.       14. Prognosis:  excellent  Delice Lesch, MD  01/28/2015

## 2015-01-28 NOTE — H&P (View-Only) (Signed)
Physical Medicine and Rehabilitation Admission H&P    Chief Complaint  Patient presents with  . TBI with right frontal hemorrhagic contusion, occipital skull fracture and SAH.    HPI: Mandy Durham is a 69 y.o. right handed female with history of HTN, anxiety, LBBB, syncope and presyncope who was admitted on 01/21/2015 after striking her head due to fall with amnesia of events and complaints of HA.  Patient has history of orthostatic hypotension that had improved on mestinon.  She was evaluated in ED and CT of the head revealed inferior right frontal hemorrhagic contusion, non-depressed occipital skull fracture, and moderate SAH around R-sylvian fissure. CTA head negative for aneurysm, occlusion or stenosis. Dr. Saintclair Halsted evaluated patient and recommended conservative management with serial CCT.  Cardiac echo done revealing EF 50-55% with anteroseptal hypokinesis, calcified MV and grade 1 diastolic dysfunction--no change since last study. Carotid dopplers without significant ICA stenosis.  Last CCT with increase in edema and size of right frontal hematoma and was treated with short course of steroids. She has had confusion that's resolving with addition of Seroquel.  She continues to have unsteady gait with nausea and vertigo due to BPPV. CIR recommended by MD and rehab team.     Review of Systems  HENT: Negative for hearing loss.   Eyes: Positive for double vision (on and off).  Respiratory: Negative for cough and shortness of breath.   Cardiovascular: Negative for chest pain, palpitations and leg swelling.  Gastrointestinal: Positive for nausea. Negative for vomiting and abdominal pain.  Genitourinary: Negative for dysuria, urgency and frequency.  Musculoskeletal: Negative for back pain, falls and neck pain.  Neurological: Positive for dizziness, sensory change (bilateral feet), speech change (voice softer per husband) and headaches.  Psychiatric/Behavioral: The patient has insomnia.    Anxiety  All other systems reviewed and are negative.     Past Medical History  Diagnosis Date  . Insomnia   . Adenomatous polyp of colon   . Overweight(278.02)   . Symptomatic menopausal or female climacteric states   . Hyperlipidemia   . Osteoarthrosis, unspecified whether generalized or localized, unspecified site   . Anxiety state, unspecified   . Hypertension   . Syncope and collapse     11/12: Holter 12/12 with rare PACS, HR range 64-140, average 82, no significant arrhythmias. Echo (1/13): EF 50-55%, mild LVH, septal-lateral dyssynchrony c/w LBBB. 3-week event monitor (1/13): No significant arrhythmia.   Marland Kitchen LBBB (left bundle branch block)     LHC in 2002 showed normal coronaries.     Past Surgical History  Procedure Laterality Date  . Tubal ligation      Family History  Problem Relation Age of Onset  . Heart failure Mother   . Coronary artery disease Mother   . Dementia Mother   . Diabetes Mother   . Colon cancer Neg Hx   . Breast cancer Neg Hx   . Hypertension Neg Hx   . Heart disease Neg Hx   . Heart attack Neg Hx   . Stroke Neg Hx     Social History:  Married. Independent and able to manage home. Does not use AD.  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 She has never used smokeless tobacco. She reports that she does not drink alcohol or use illicit drugs.    Allergies: No Known Allergies    Medications Prior to Admission  Medication Sig Dispense Refill  . ALPRAZolam Duanne Moron)  0.25 MG tablet Take 1-2 tablets (0.25-0.5 mg total) by mouth at bedtime as needed for sleep. 60 tablet 1  . calcium carbonate (OS-CAL) 600 MG TABS tablet Take 1,200 mg by mouth daily with breakfast.    . cholecalciferol (VITAMIN D) 1000 UNITS tablet Take 2,000 Units by mouth daily.     . metoprolol tartrate (LOPRESSOR) 25 MG tablet Take 1 tablet (25 mg total) by mouth 2 (two) times daily. 180 tablet 3  . PARoxetine (PAXIL) 10 MG tablet  Take 10 mg by mouth daily.    Marland Kitchen pyridostigmine (MESTINON) 60 MG tablet Take 1 tablet (60 mg total) by mouth 3 (three) times daily. 135 tablet 3  . hydrocortisone 2.5 % cream Apply topically 2 (two) times daily. (Patient not taking: Reported on 01/21/2015) 30 g 0    Home: Home Living Family/patient expects to be discharged to:: Inpatient rehab Living Arrangements: Spouse/significant other Available Help at Discharge: Family, Available 24 hours/day Bathroom Shower/Tub: Insurance claims handler: None   Functional History: Prior Function Level of Independence: Independent Comments: including driving and IADLs  Functional Status:  Mobility: Bed Mobility Overal bed mobility: Needs Assistance Bed Mobility: Rolling, Sidelying to Sit Rolling: Min assist Sidelying to sit: Mod assist Sit to supine: Supervision General bed mobility comments: vc for sequencing; incr time; assist due to vertigo and fear of falling (rolling); assist to sit due to imbalance Transfers Overall transfer level: Needs assistance Equipment used: 1 person hand held assist Transfers: Sit to/from Stand Sit to Stand: Min assist General transfer comment: feaf of falling; moves slowly Ambulation/Gait Ambulation/Gait assistance: Mod assist, Min assist Ambulation Distance (Feet): 10 Feet Assistive device: 1 person hand held assist Gait Pattern/deviations: Step-through pattern, Decreased stride length, Shuffle, Leaning posteriorly, Wide base of support General Gait Details: initial steps mod assist due to imbalance; progressed to min assist, however very wide BOS and shuffling steps Gait velocity: very slwo    ADL: ADL Overall ADL's : Needs assistance/impaired Eating/Feeding: Independent, Sitting Grooming: Wash/dry hands, Min guard, Standing Upper Body Bathing: Set up, Sitting Lower Body Bathing: Min guard, Sit to/from stand Upper Body Dressing : Set up, Sitting Lower Body Dressing: Min guard, Sit to/from  stand Toilet Transfer: Minimal assistance, Ambulation, Regular Toilet, Grab bars Toileting- Clothing Manipulation and Hygiene: Min guard, Sit to/from stand  Cognition: Cognition Overall Cognitive Status: Impaired/Different from baseline Orientation Level: Oriented X4 Cognition Arousal/Alertness: Awake/alert Behavior During Therapy: Flat affect Overall Cognitive Status: Impaired/Different from baseline Area of Impairment: Problem solving Orientation Level: Disoriented to, Time, Situation Current Attention Level: Sustained Memory: Decreased recall of precautions, Decreased short-term memory Following Commands: Follows one step commands with increased time Safety/Judgement: Decreased awareness of safety, Decreased awareness of deficits Awareness: Intellectual Problem Solving: Slow processing, Difficulty sequencing, Requires verbal cues, Requires tactile cues General Comments: required repetition of instructions (multiple times) during mobility   Physical Exam: Blood pressure 153/60, pulse 62, temperature 98.3 F (36.8 C), temperature source Oral, resp. rate 20, height 5' 5.5" (1.664 m), weight 51.71 kg (114 lb), SpO2 98 %. Physical Exam  Nursing note and vitals reviewed. Constitutional: She is oriented to person, place, and time. She appears well-developed and well-nourished. No distress.  HENT:  Head: Normocephalic.  Tender, dry crusted bloody area on occiput.   Eyes: Conjunctivae and EOM are normal. Pupils are equal, round, and reactive to light.  Neck: Normal range of motion. Neck supple.  Cardiovascular: Normal rate and regular rhythm.   Respiratory: Effort normal and breath sounds normal. No respiratory  distress. She has no wheezes.  GI: Soft. Bowel sounds are normal. She exhibits no distension. There is no tenderness.  Musculoskeletal: She exhibits no edema or tenderness.  Strength b/l UE 4+/5 proximal to distal  B/l LE: 4/5 proximally, 4+/5 distally  Neurological: She is  alert and oriented to person, place, and time. Coordination (BUE) abnormal.  Soft voice without dysarthria. Low volume due to discomfort and reports intermittent diplopia. She is able to follow one and two step commands without difficulty.    Skin: Skin is warm and dry. No rash noted. She is not diaphoretic. No erythema.  Psychiatric: Her behavior is normal. Thought content normal.  Flat affect    No results found for this or any previous visit (from the past 48 hour(s)). No results found.   Medical Problem List and Plan: 1. Functional deficits secondary to traumatic right frontal hemorrhagic contusion, occipital skull fracture and SAH  Fall precautions; pt at risk for second impact syndrome  Avoid medications that could impair cognitive abilities, such as anticholinergics, antihistaminic, benzodiazapines, narcotics, etc when possible 2.  DVT Prophylaxis/Anticoagulation: Mechanical: Sequential compression devices, below knee Bilateral lower extremities and increase in mobility/activity level.  3. Pain Management: Reports prn hydrocodone effective.  4. Mood: continue Paxil.  LCSW to follow for evaluation and support.  5. Neuropsych: This patient is capable of making decisions on  her own behalf. 6. Skin/Wound Care: Routine pressure relief measures. Able to reposition independently 7. Fluids/Electrolytes/Nutrition: Monitor I/O. Check lytes in am. 8. LBBB/Supine HTN: On metoprolol bid. Mestonin to help with orthostatic changes as florinef ineffective.  9. Insomnia: Used Xanax for sleep at home. Seroquel is effective.  10. Low protein stores: Add supplement   Post Admission Physician Evaluation: 1. Functional deficits secondary to traumatic right frontal hemorrhagic contusion, occipital skull fracture and SAH. 2. Patient is admitted to receive collaborative, interdisciplinary care between the physiatrist, rehab nursing staff, and therapy team. 3. Patient's level of medical complexity and  substantial therapy needs in context of that medical necessity cannot be provided at a lesser intensity of care such as a SNF. 4. Patient has experienced substantial functional loss from his/her baseline which was documented above under the "Functional History" and "Functional Status" headings.  Judging by the patient's diagnosis, physical exam, and functional history, the patient has potential for functional progress which will result in measurable gains while on inpatient rehab.  These gains will be of substantial and practical use upon discharge  in facilitating mobility and self-care at the household level. 5. Physiatrist will provide 24 hour management of medical needs as well as oversight of the therapy plan/treatment and provide guidance as appropriate regarding the interaction of the two. 6. 24 hour rehab nursing will assist with bladder management, bowel management, safety, skin/wound care, disease management, medication administration, pain management and patient education  and help integrate therapy concepts, techniques,education, etc. 7. PT will assess and treat for/with: Lower extremity strength, range of motion, stamina, balance, functional mobility, safety, adaptive techniques and equipment, woundcare, coping skills, pain control, brain injury education.   Goals are: Mod I. 8. OT will assess and treat for/with: ADL's, functional mobility, safety, upper extremity strength, adaptive techniques and equipment, wound mgt, ego support, and community reintegration.   Goals are: Mod I. Therapy may proceed with showering this patient. 9. SLP will assess and treat for/with: memory, cognition, higher level processing.  Goals are: Supervision/Mod I. 10. Case Management and Social Worker will assess and treat for psychological issues and discharge planning.  11. Team conference will be held weekly to assess progress toward goals and to determine barriers to discharge. 12. Patient will receive at least 3  hours of therapy per day at least 5 days per week. 13. ELOS: 7-10 days.       14. Prognosis:  excellent  Delice Lesch, MD  01/28/2015

## 2015-01-28 NOTE — Progress Notes (Signed)
Retta Diones, RN Rehab Admission Coordinator Signed Physical Medicine and Rehabilitation PMR Pre-admission 01/28/2015 1:35 PM  Related encounter: ED to Hosp-Admission (Current) from 01/21/2015 in [place] Collapse All   PMR Admission Coordinator Pre-Admission Assessment  Patient: Mandy Durham is an 69 y.o., female MRN: [id number] DOB: 1945-09-04 Height: 5' 5.5" (166.4 cm) Weight: 51.71 kg (114 lb)  Insurance Information HMO: PPO: PCP: IPA: 80/20: OTHER: Medicare Replacement policy PRIMARY: [employer] Medicare Policy#: [id number] Subscriber: self CM Name: Su Hoff Phone#: [phone] Rhunette Croft [phone] Fax#: [phone] Pre-Cert#: [id number] Employer: retired Benefits: Phone #: [phone] Name: Melody Eff. Date: 03/16/14 Deduct: $0 Out of Pocket Max: $4950 (met $271.86) Life Max: unlimited CIR: $312 per day with max of $1560 SNF: $0 days 1-20; $160 daily, days 21-100 Outpatient: $40 copay per visit Co-Pay: based on medical necessity  Home Health: 100% Co-Pay:  DME: 80% Co-Pay: 20% Providers: In network  Medicaid Application Date: Case Manager:  Disability Application Date: Case Worker:   Emergency Contact Information Contact Information    Name Relation Home Work Mobile   Rouser,Eddie Spouse   [phone]     Current Medical History  Patient Admitting Diagnosis: traumatic right fronto-temporal subdural hemorrhage, right frontal intraparenchymal hemorrhage  History of Present Illness: Mandy Durham is a 69 y.o. right handed female with history of HTN, anxiety, LBBB, syncope and presyncope who was admitted on 01/21/2015 after striking her head due to  fall with amnesia of events and complaints of HA. Patient has history of orthostatic hypotension that had improved on mestinon. She was evaluated in ED and CT of the head revealed inferior right frontal hemorrhagic contusion, non-depressed occipital skull fracture, and moderate SAH around R-sylvian fissure. CTA head negative for aneurysm, occlusion or stenosis. Dr. Saintclair Halsted evaluated patient and recommended conservative management with serial CCT. Cardiac echo done revealing EF 50-55% with anteroseptal hypokinesis, calcified MV and grade 1 diastolic dysfunction--no change since last study. Carotid dopplers without significant ICA stenosis. Last CCT with increase in edema and size of right frontal hematoma and was treated with short course of steroids. She has had confusion that's resolving with addition of Seroquel. She continues to have unsteady gait with nausea and vertigo due to BPPV. CIR recommended by MD and rehab team.  Total: 0=NIH  Past Medical History  Past Medical History  Diagnosis Date  . Insomnia   . Adenomatous polyp of colon   . Overweight(278.02)   . Symptomatic menopausal or female climacteric states   . Hyperlipidemia   . Osteoarthrosis, unspecified whether generalized or localized, unspecified site   . Anxiety state, unspecified   . Hypertension   . Syncope and collapse     11/12: Holter 12/12 with rare PACS, HR range 64-140, average 82, no significant arrhythmias. Echo (1/13): EF 50-55%, mild LVH, septal-lateral dyssynchrony c/w LBBB. 3-week event monitor (1/13): No significant arrhythmia.   Marland Kitchen LBBB (left bundle branch block)     LHC in 2002 showed normal coronaries.     Family History  family history includes Coronary artery disease in her mother; Dementia in her mother; Diabetes in her mother; Heart failure in her mother. There is no history of Colon cancer, Breast cancer, Hypertension, Heart disease, Heart attack, or  Stroke.  Prior Rehab/Hospitalizations:  Has the patient had major surgery during 100 days prior to admission? No  Current Medications   Current facility-administered medications:  . acetaminophen (TYLENOL) tablet 650 mg, 650 mg, Oral, Q4H PRN,  650 mg at 01/27/15 0809 **OR** acetaminophen (TYLENOL) suppository 650 mg, 650 mg, Rectal, Q4H PRN, Kary Kos, MD . HYDROcodone-acetaminophen (NORCO/VICODIN) 5-325 MG per tablet 1-2 tablet, 1-2 tablet, Oral, Q4H PRN, Ashok Pall, MD, 1 tablet at 01/28/15 0808 . HYDROmorphone (DILAUDID) injection 0.5 mg, 0.5 mg, Intravenous, Q2H PRN, Kary Kos, MD . labetalol (NORMODYNE,TRANDATE) injection 10-40 mg, 10-40 mg, Intravenous, Q10 min PRN, Kary Kos, MD . metoprolol tartrate (LOPRESSOR) tablet 25 mg, 25 mg, Oral, BID, Cherene Altes, MD, 25 mg at 01/28/15 1002 . ondansetron (ZOFRAN) injection 4 mg, 4 mg, Intravenous, Q4H PRN, Cherene Altes, MD, 4 mg at 01/28/15 0808 . oxyCODONE (Oxy IR/ROXICODONE) immediate release tablet 5-10 mg, 5-10 mg, Oral, Q4H PRN, Ashok Pall, MD . PARoxetine (PAXIL) tablet 10 mg, 10 mg, Oral, Daily, Kary Kos, MD, 10 mg at 01/28/15 1002 . pyridostigmine (MESTINON) tablet 60 mg, 60 mg, Oral, TID, Kary Kos, MD, 60 mg at 01/28/15 1002 . senna-docusate (Senokot-S) tablet 1 tablet, 1 tablet, Oral, BID, Kary Kos, MD, 1 tablet at 01/28/15 1002  Patients Current Diet: Diet regular Room service appropriate?: Yes; Fluid consistency:: Thin  Precautions / Restrictions Precautions Precautions: Fall Restrictions Weight Bearing Restrictions: No   Has the patient had 2 or more falls or a fall with injury in the past year?No  Prior Activity Level Community (5-7x/wk): Per husband, pt. is out of the home most days doing errands, grocery shopping  [place] Devices/Equipment: None Home Equipment: None  Prior Device Use: Indicate devices/aids used by the patient prior to current  illness, exacerbation or injury? None of the above  Prior Functional Level Prior Function Level of Independence: Independent Comments: including driving and IADLs  Self Care: Did the patient need help bathing, dressing, using the toilet or eating? Independent  Indoor Mobility: Did the patient need assistance with walking from room to room (with or without device)? Independent  Stairs: Did the patient need assistance with internal or external stairs (with or without device)? Independent  Functional Cognition: Did the patient need help planning regular tasks such as shopping or remembering to take medications? Independent  Current Functional Level Cognition  Overall Cognitive Status: Impaired/Different from baseline Current Attention Level: Sustained Orientation Level: Oriented X4 Following Commands: Follows one step commands with increased time Safety/Judgement: Decreased awareness of safety, Decreased awareness of deficits General Comments: required repetition of instructions (multiple times) during mobility   Extremity Assessment (includes Sensation/Coordination)  Upper Extremity Assessment: Overall WFL for tasks assessed  Lower Extremity Assessment: Generalized weakness    ADLs  Overall ADL's : Needs assistance/impaired Eating/Feeding: Independent, Sitting Grooming: Wash/dry hands, Min guard, Standing Upper Body Bathing: Set up, Sitting Lower Body Bathing: Min guard, Sit to/from stand Upper Body Dressing : Set up, Sitting Lower Body Dressing: Min guard, Sit to/from stand Toilet Transfer: Minimal assistance, Ambulation, Regular Toilet, Grab bars Toileting- Clothing Manipulation and Hygiene: Min guard, Sit to/from stand    Mobility  Overal bed mobility: Needs Assistance Bed Mobility: Rolling, Sidelying to Sit, Sit to Supine Rolling: Min assist Sidelying to sit: Mod assist Sit to supine: Min assist General bed mobility comments: vc for sequencing; incr time;  assist due to vertigo and fear of falling (rolling); assist to sit due to imbalance    Transfers  Overall transfer level: Needs assistance Equipment used: 1 person hand held assist Transfers: Sit to/from Stand Sit to Stand: Min assist General transfer comment: feaf of falling; moves slowly    Ambulation / Gait /  Stairs / Emergency planning/management officer  Ambulation/Gait Ambulation/Gait assistance: Museum/gallery curator (Feet): 60 Feet Assistive device: 1 person hand held assist Gait Pattern/deviations: Step-through pattern, Decreased stride length, Wide base of support General Gait Details: ambulated prior to testing for BPPV in Lt ear; very fearful but denied vertigo when walking Gait velocity: very slow    Posture / Balance Dynamic Sitting Balance Sitting balance - Comments: vertigo Balance Overall balance assessment: Needs assistance, History of Falls Sitting-balance support: Bilateral upper extremity supported, Feet supported Sitting balance-Leahy Scale: Poor Sitting balance - Comments: vertigo Postural control: Left lateral lean, Posterior lean Standing balance support: Single extremity supported Standing balance-Leahy Scale: Poor    Special needs/care consideration BiPAP/CPAP no CPM no Continuous Drip IV no Dialysis no Days  Life Vest no Oxygen no Special Bed no Trach Size no Wound Vac (area) no Location Skin no Location  Bowel mgmt: 01/27/15 last BM, continent; to bathroom with assist Bladder mgmt: continent, to bathroom with assist  Diabetic mgmt no     Previous Home Environment Living Arrangements: Spouse/significant other Available Help at Discharge: Family, Available 24 hours/day Bathroom Shower/Tub: Gaffer Home Care Services: No  Discharge Living Setting Plans for Discharge Living Setting: Patient's home Type of Home at Discharge: House Discharge Home Layout: One level Discharge Home  Access: Stairs to enter Entrance Stairs-Rails: None Entrance Stairs-Number of Steps: 3 Discharge Bathroom Shower/Tub: Walk-in shower Discharge Bathroom Toilet: Handicapped height Discharge Bathroom Accessibility: Yes How Accessible: Accessible via walker Does the patient have any problems obtaining your medications?: No  Social/Family/Support Systems Patient Roles: Spouse, Parent Anticipated Caregiver: husband, Libia Fazzini Anticipated Caregiver's Contact Information: Clarann Helvey [phone] Ability/Limitations of Caregiver: husband is available to assist pt. as needed  Caregiver Availability: 24/7 Discharge Plan Discussed with Primary Caregiver: Yes Is Caregiver In Agreement with Plan?: Yes Does Caregiver/Family have Issues with Lodging/Transportation while Pt is in Rehab?: No  Goals/Additional Needs Patient/Family Goal for Rehab: modified independent PT/OT, modified independent and supervision for SLP Expected length of stay: 7-10 days Cultural Considerations: none Dietary Needs: regular diet, thin liquids Equipment Needs: TBA Pt/Family Agrees to Admission and willing to participate: Yes Program Orientation Provided & Reviewed with Pt/Caregiver Including Roles & Responsibilities: Yes  Decrease burden of Care through IP rehab admission: no  Possible need for SNF placement upon discharge: Not anticipated  Patient Condition: This patient's medical and functional status has changed since the consult dated: 01/25/15 in which the Rehabilitation Physician determined and documented that the patient's condition is appropriate for intensive rehabilitative care in an inpatient rehabilitation facility. See "History of Present Illness" (above) for medical update. Functional changes are: Currently requiring min assist transfers and min assist 60 ft +1 HHA. Patient's medical and functional status update has been discussed with the Rehabilitation physician and patient remains appropriate for  inpatient rehabilitation. Will admit to inpatient rehab today.  Preadmission Screen Completed By: Retta Diones, 01/28/2015 3:02 PM ______________________________________________________________________  Discussed status with Dr. Posey Pronto on 01/28/15 at 1500 and received telephone approval for admission today.  Admission Coordinator: Retta Diones, time1500/Date11/14/16          Cosigned by: Ankit [name], MD at 01/28/2015 3:07 PM  Revision History     Date/Time User Provider Type Action   01/28/2015 3:07 PM Ankit [name], MD Physician Cosign   01/28/2015 3:02 PM Retta Diones, RN Rehab Admission Coordinator Sign   01/28/2015 3:00 PM Retta Diones, RN Rehab Admission Coordinator Share   01/28/2015 1:58 PM Wayne Both,  [place] Rehab Admission Coordinator Share   View Details Report

## 2015-01-28 NOTE — Progress Notes (Signed)
Meredith Staggers, MD Physician Signed Physical Medicine and Rehabilitation Consult Note 01/25/2015 2:57 PM  Related encounter: ED to Hosp-Admission (Current) from 01/21/2015 in [place] Collapse All        Physical Medicine and Rehabilitation Consult Reason for Consult: Traumatic right frontal subdural hematoma Referring Physician: [facility]   HPI: Mandy Durham is a 69 y.o. right handed female with history of hypertension as well as syncopal episodes in the past. Independent prior to admission living with spouse Presented 01/21/2015 after a fall in the kitchen and struck her head. Complaints of headache. CT of the head showed anterior right frontal lobe parenchymal hematoma measuring 11 x 7 x 12.7 mm with mild mass effect. Neurosurgery Dr. Saintclair Halsted with conservative care. Echocardiogram with ejection fraction of [id number] grade 1 diastolic dysfunction. Carotid Dopplers with no ICA stenosis. Follow-up cranial CT scan remarkable for evolution of some of her contusions no change in subdural and minimal mass effect. Maintained on Decadron protocol. Tolerating a regular diet. Physical therapy evaluation completed 01/25/2015 with recommendations of physical medicine rehabilitation consult.   Review of Systems  Constitutional: Negative for fever and chills.  HENT: Negative for hearing loss.  Eyes: Negative for blurred vision and double vision.  Respiratory: Negative for cough and shortness of breath.  Cardiovascular: Negative for chest pain, palpitations and leg swelling.  Gastrointestinal: Positive for constipation. Negative for nausea and vomiting.  Genitourinary: Negative for dysuria and hematuria.  Musculoskeletal: Positive for myalgias and falls.  Skin: Negative for rash.  Neurological: Positive for dizziness and headaches. Negative for seizures.   Syncopal episodes  Psychiatric/Behavioral: The patient has insomnia.   Anxiety   Past  Medical History  Diagnosis Date  . Insomnia   . Adenomatous polyp of colon   . Overweight(278.02)   . Symptomatic menopausal or female climacteric states   . Hyperlipidemia   . Osteoarthrosis, unspecified whether generalized or localized, unspecified site   . Anxiety state, unspecified   . Hypertension   . Syncope and collapse     11/12: Holter 12/12 with rare PACS, HR range 64-140, average 82, no significant arrhythmias. Echo (1/13): EF 50-55%, mild LVH, septal-lateral dyssynchrony c/w LBBB. 3-week event monitor (1/13): No significant arrhythmia.   Marland Kitchen LBBB (left bundle branch block)     LHC in 2002 showed normal coronaries.    Past Surgical History  Procedure Laterality Date  . Tubal ligation     Family History  Problem Relation Age of Onset  . Heart failure Mother   . Coronary artery disease Mother   . Dementia Mother   . Diabetes Mother   . Colon cancer Neg Hx   . Breast cancer Neg Hx   . Hypertension Neg Hx   . Heart disease Neg Hx   . Heart attack Neg Hx   . Stroke Neg Hx    Social History:  reports that she has never smoked. She has never used smokeless tobacco. She reports that she does not drink alcohol or use illicit drugs. Allergies: No Known Allergies Medications Prior to Admission  Medication Sig Dispense Refill  . ALPRAZolam (XANAX) 0.25 MG tablet Take 1-2 tablets (0.25-0.5 mg total) by mouth at bedtime as needed for sleep. 60 tablet 1  . calcium carbonate (OS-CAL) 600 MG TABS tablet Take 1,200 mg by mouth daily with breakfast.    . cholecalciferol (VITAMIN D) 1000 UNITS tablet Take 2,000 Units by mouth daily.     Marland Kitchen  metoprolol tartrate (LOPRESSOR) 25 MG tablet Take 1 tablet (25 mg total) by mouth 2 (two) times daily. 180 tablet 3  . PARoxetine (PAXIL) 10 MG tablet Take 10 mg by mouth daily.    Marland Kitchen pyridostigmine (MESTINON) 60 MG tablet Take 1  tablet (60 mg total) by mouth 3 (three) times daily. 135 tablet 3  . hydrocortisone 2.5 % cream Apply topically 2 (two) times daily. (Patient not taking: Reported on 01/21/2015) 30 g 0    Home: Home Living Family/patient expects to be discharged to:: Inpatient rehab Living Arrangements: Spouse/significant other  Functional History: Prior Function Level of Independence: Independent Functional Status:  Mobility: Bed Mobility Overal bed mobility: Needs Assistance Bed Mobility: Sit to Supine Sit to supine: Min guard General bed mobility comments: pt needs increased time, but was to complete without physical A.  Transfers Overall transfer level: Needs assistance Equipment used: None Transfers: Sit to/from Stand Sit to Stand: Min assist, Mod assist General transfer comment: Amount of A fluctuates pending pt's attention to task. pt with posterior and R lateral lean.  Ambulation/Gait Ambulation/Gait assistance: Min assist Ambulation Distance (Feet): 15 Feet (x2) Assistive device: 1 person hand held assist Gait Pattern/deviations: Step-through pattern, Decreased stride length, Narrow base of support General Gait Details: pt moves slowly and off balance. pt fatigues easily and needed to sit down after only 15'. pt continues to lean posteriorly and to R side.     ADL:    Cognition: Cognition Overall Cognitive Status: Impaired/Different from baseline Orientation Level: Oriented to person, Disoriented to place, Disoriented to time, Disoriented to situation Cognition Arousal/Alertness: Awake/alert Behavior During Therapy: Restless Overall Cognitive Status: Impaired/Different from baseline Area of Impairment: Attention, Orientation, Memory, Following commands, Safety/judgement, Awareness, Problem solving Orientation Level: Disoriented to, Time, Situation Current Attention Level: Sustained Memory: Decreased recall of precautions, Decreased short-term memory Following  Commands: Follows one step commands inconsistently, Follows one step commands with increased time Safety/Judgement: Decreased awareness of safety, Decreased awareness of deficits Awareness: Intellectual Problem Solving: Slow processing, Decreased initiation, Difficulty sequencing, Requires verbal cues, Requires tactile cues General Comments: pt was able to state she was at [facility], but thought she was at one of the outpatient clinics. When given options of why she was in the hospital, pt was able to pick the correct option. Difficult to keep on task at times.   Blood pressure 170/77, pulse 62, temperature 98.2 F (36.8 C), temperature source Oral, resp. rate 16, height 5' 5.5" (1.664 m), weight 51.71 kg (114 lb), SpO2 94 %. Physical Exam  HENT:  Head: Normocephalic.  Eyes: EOM are normal.  Neck: Normal range of motion. Neck supple. No thyromegaly present.  Cardiovascular: Normal rate and regular rhythm.  Respiratory: Effort normal and breath sounds normal. No respiratory distress.  GI: Soft. Bowel sounds are normal. She exhibits no distension.  Neurological:  Mood is flat but appropriate. She is oriented to person and place. She did not recall for events of her fall. Follows simple commands.  Skin: Skin is warm and dry.     Lab Results Last 24 Hours    Results for orders placed or performed during the hospital encounter of 01/21/15 (from the past 24 hour(s))  Urinalysis, Routine w reflex microscopic (not at [facility]) Status: None   Collection Time: 01/25/15 9:15 AM  Result Value Ref Range   Color, Urine YELLOW YELLOW   APPearance CLEAR CLEAR   Specific Gravity, Urine 1.011 1.005 - 1.030   pH 7.5 5.0 - 8.0  Glucose, UA NEGATIVE NEGATIVE mg/dL   Hgb urine dipstick NEGATIVE NEGATIVE   Bilirubin Urine NEGATIVE NEGATIVE   Ketones, ur NEGATIVE NEGATIVE mg/dL   Protein, ur NEGATIVE NEGATIVE mg/dL   Urobilinogen, UA 0.2 0.0 - 1.0  mg/dL   Nitrite NEGATIVE NEGATIVE   Leukocytes, UA NEGATIVE NEGATIVE  Basic metabolic panel Status: Abnormal   Collection Time: 01/25/15 9:24 AM  Result Value Ref Range   Sodium 138 135 - 145 mmol/L   Potassium 3.3 (L) 3.5 - 5.1 mmol/L   Chloride 106 101 - 111 mmol/L   CO2 24 22 - 32 mmol/L   Glucose, Bld 101 (H) 65 - 99 mg/dL   BUN 11 6 - 20 mg/dL   Creatinine, Ser 0.61 0.44 - 1.00 mg/dL   Calcium 8.6 (L) 8.9 - 10.3 mg/dL   GFR calc non Af Amer >60 >60 mL/min   GFR calc Af Amer >60 >60 mL/min   Anion gap 8 5 - 15  Vitamin B12 Status: None   Collection Time: 01/25/15 12:30 PM  Result Value Ref Range   Vitamin B-12 391 180 - 914 pg/mL  Folate Status: None   Collection Time: 01/25/15 12:30 PM  Result Value Ref Range   Folate 20.3 >5.9 ng/mL      Imaging Results (Last 48 hours)    Ct Head Wo Contrast  01/24/2015 CLINICAL DATA: 69 year old female post fall 3 days ago. Subsequent encounter. EXAM: CT HEAD WITHOUT CONTRAST TECHNIQUE: Contiguous axial images were obtained from the base of the skull through the vertex without intravenous contrast. COMPARISON: Most recent exam 01/22/2015. FINDINGS: Anterior right frontal lobe parenchymal hematoma has increased in size now measuring 11.7 x 12.7 mm versus prior 7.8 x 9.5 mm. Increase amount of surrounding vasogenic edema. Mild mass effect upon the frontal horn of the right lateral ventricle without midline shift. No significant change right sylvian fissure/right frontal region subarachnoid hemorrhage. Similar appearance of broad-based right frontal-temporal subdural hematoma measuring up to 3 mm. Nondisplaced calvarial fracture extends from the left parietal region into the right occipital region/the skullbase to the right of midline. No CT evidence of large acute thrombotic infarct. No intracranial mass lesion noted on this unenhanced exam. IMPRESSION:  Anterior right frontal lobe parenchymal hematoma has increased in size now measuring 11.7 x 12.7 mm versus prior 7.8 x 9.5 mm. Increase amount of surrounding vasogenic edema. Mild mass effect upon the frontal horn of the right lateral ventricle without midline shift. No significant change right sylvian fissure/right frontal region subarachnoid hemorrhage. Similar appearance of broad-based right frontal-temporal subdural hematoma measuring up to 3 mm. Nondisplaced calvarial fracture extends from the left parietal region into the right occipital region/the skullbase to the right of midline. Electronically Signed By: Genia Del M.D. On: 01/24/2015 07:01     Assessment/Plan: Diagnosis: traumatic right fronto-temporal subdural hemorrhage, right frontal intraparenchymal hemorrhage 1. Does the need for close, 24 hr/day medical supervision in concert with the patient's rehab needs make it unreasonable for this patient to be served in a less intensive setting? Yes 2. Co-Morbidities requiring supervision/potential complications: syncope, orthostasis, hypertension, hx of LBBB,  3. Due to bladder management, bowel management, safety, skin/wound care, disease management, medication administration, pain management and patient education, does the patient require 24 hr/day rehab nursing? Yes 4. Does the patient require coordinated care of a physician, rehab nurse, PT (1-2 hrs/day, 5 days/week), OT (1-2 hrs/day, 5 days/week) and SLP (1-2 hrs/day, 5 days/week) to address physical and functional deficits in the context of the  above medical diagnosis(es)? Yes Addressing deficits in the following areas: balance, endurance, locomotion, strength, transferring, bowel/bladder control, bathing, dressing, feeding, grooming, toileting, cognition and psychosocial support 5. Can the patient actively participate in an intensive therapy program of at least 3 hrs of therapy per day at least 5 days per week? Yes 6. The potential  for patient to make measurable gains while on inpatient rehab is excellent 7. Anticipated functional outcomes upon discharge from inpatient rehab are modified independent with PT, modified independent with OT, modified independent and supervision with SLP. 8. Estimated rehab length of stay to reach the above functional goals is: 7-10 days 9. Does the patient have adequate social supports and living environment to accommodate these discharge functional goals? Yes 10. Anticipated D/C setting: Home 11. Anticipated post D/C treatments: HH therapy and Outpatient therapy 12. Overall Rehab/Functional Prognosis: excellent  RECOMMENDATIONS: This patient's condition is appropriate for continued rehabilitative care in the following setting: CIR Patient has agreed to participate in recommended program. Yes Note that insurance prior authorization may be required for reimbursement for recommended care.  Comment: Rehab Admissions Coordinator to follow up.  Thanks,  Meredith Staggers, MD, [facility]     01/25/2015       Revision History     Date/Time User Provider Type Action   01/25/2015 3:21 PM Meredith Staggers, MD Physician Sign   01/25/2015 3:02 PM Cathlyn Parsons, PA-C Physician Assistant Pend   View Details Report       Routing History     Date/Time From To Method   01/25/2015 3:21 PM Meredith Staggers, MD Meredith Staggers, MD In Basket   01/25/2015 3:21 PM Meredith Staggers, MD [place], MD In [facility]

## 2015-01-28 NOTE — Progress Notes (Signed)
Discharge orders received. Pt and spouse notified. Report given to 60M RN. Pt and belongings transferred to 60M01.

## 2015-01-28 NOTE — Clinical Social Work Note (Signed)
Clinical Social Work Assessment  Patient Details  Name: Mandy Durham MRN: [id number] Date of Birth: 28-Jul-1945  Date of referral:  01/28/15               Reason for consult:  Facility Placement, Discharge Planning                Permission sought to share information with:  Family Supports Permission granted to share information::  Yes, Verbal Permission Granted  Name::      Wylene Simmer, Spouse)  Agency::   (SNF)  Relationship::   (spouse, Shirlyn Sutliff)  Contact Information:   [name]- [phone])  Housing/Transportation Living arrangements for the past 2 months:  [place] of Information:  Spouse Patient Interpreter Needed:  None Criminal Activity/Legal Involvement Pertinent to Current Situation/Hospitalization:  No - Comment as needed Significant Relationships:  Spouse Lives with:  Spouse Do you feel safe going back to the place where you live?  No Need for family participation in patient care:  Yes (Comment)  Care giving concerns:   Patient's spouse expressed home safety concerns once d/c from SNF.    Social Worker assessment / plan:   Patient a/o to self only. BSW intern spoke with patient's spouse, [name] to discuss discharge planning. Patient's spouse was aware that PT has recommended that patient d/c to [facility] intern presented patient's spouse with a SNF list in the event that CIR will not accept patient's insurance. Patient's spouse was agreeable of patient discharging to SNF if CIR will not accept. BSW intern explained SNF process and insurance coverage with patient's spouse. Patient's spouse informed BSW intern that patient has not been to SNF in the past, however expressed interest in patient d/c to [facility] or [facility].Patient's spouse made BSW intern aware that patient will have 24 hour supervision upon returning home. Patient's spouse gave BSW intern verbal permission to fax patient's clinicals to all SNF within the [facility] region. BSW to fax patient's clinicals and follow up with family in reference to bed offers which are extended, once reviewed by SNF facilities. BSW intern remains available if any further Social Work needs arises.  Employment status:  Retired Forensic scientist:  [employer]) PT Recommendations:  Inpatient [place] / Referral to community resources:  [place]  Patient/Family's Response to care:   Patient's spouse expressed concerns with not being able to care for patient properly upon returning back to residence. Patient's spouse is very hopeful that patient will qualify to d/c to CIR.  Patient's spouse appreciated Social Work intervention from [employer].  Patient/Family's Understanding of and Emotional Response to Diagnosis, Current Treatment, and Prognosis:  Patient's spouse understands the need for further medical care and rehab at [facility].  Emotional Assessment Appearance:  Appears stated age Attitude/Demeanor/Rapport:   (calm) Affect (typically observed):  Accepting, Appropriate, Calm, Quiet Orientation:  Oriented to Self Alcohol / Substance use:  Not Applicable Psych involvement (Current and /or in the community):  No (Comment)  Discharge Needs  Concerns to be addressed:  Home Safety Concerns Readmission within the last 30 days:  No Current discharge risk:  None Barriers to Discharge:  No Barriers Identified   Leane Call, Student-SW 01/28/2015, 11:01 AM

## 2015-01-28 NOTE — Care Management Important Message (Signed)
Important Message  Patient Details  Name: Erricka AKAYLA BRANIFF MRN: [id number] Date of Birth: [birth year]   Medicare Important Message Given:  Yes    Barb Merino Adrienne Trombetta 01/28/2015, 2:56 PM

## 2015-01-28 NOTE — Progress Notes (Signed)
Patient arrived to unit form 106M at 1630 with husband at the bedside. Discussed with patient rehab routine, and resource book given and explained. Safety plan reviewed and explained, pt and spouse verbalized understanding. Call bell left within reach. Cont plan of care. Veron Senner, Dione Plover

## 2015-01-28 NOTE — Progress Notes (Signed)
Physical Therapy Vestibular Evaluation    01/28/15 0955  Vestibular Assessment  General Observation Pt reported incr nausea and dizziness/spinning with rolling side to supine at beginning of session.   Symptom Behavior  Type of Dizziness Spinning  Frequency of Dizziness with movement  Duration of Dizziness <1 minute  Aggravating Factors Activity in general  Relieving Factors Head stationary;Dark room;Closing eyes;Rest  Occulomotor Exam  Occulomotor Alignment Normal  Spontaneous Absent  Positional Testing  Dix-Hallpike Dix-Hallpike Right  Horizontal Canal Testing Horizontal Canal Right;Horizontal Canal Left  Dix-Hallpike Right  Dix-Hallpike Right Duration 45 seconds  Dix-Hallpike Right Symptoms Downbeat, left rotatory nystagmus  Horizontal Canal Right  Horizontal Canal Right Duration 0  Horizontal Canal Right Symptoms Normal  Horizontal Canal Left  Horizontal Canal Left Duration 0  Horizontal Canal Left Symptoms Normal   Pager [phone]

## 2015-01-28 NOTE — NC FL2 (Signed)
[place] LEVEL OF CARE SCREENING TOOL     IDENTIFICATION  Patient Name: Mandy Durham Birthdate: 1945-06-30 Sex: female Admission Date (Current Location): 01/21/2015  [facility] and [place] Number: Company secretary and Address:  [facility] [place], [place] [address] [place], [place] [address]      Provider Number: [id number]  Attending Physician Name and Address:  Cherene Altes, MD  Relative Name and Phone Number:  Annalene Schor [phone]    Current Level of Care: Hospital Recommended Level of Care: [place] Prior Approval Number:    Date Approved/Denied:   PASRR Number: [id number]  Discharge Plan: SNF    Current Diagnoses: Patient Active Problem List   Diagnosis Date Noted  . Skull fracture ([place])   . Vertigo due to brain injury ([place])   . Subarachnoid bleed ([place]) 01/22/2015  . SAH (subarachnoid hemorrhage) ([place]) 01/21/2015  . Annual physical exam 09/07/2014  . Orthostatic hypotension 08/30/2014  . Acromioclavicular arthrosis 03/21/2014  . Allergic rhinitis 02/27/2014  . SI (sacroiliac) joint dysfunction 02/02/2014  . Gastrocnemius strain, left 12/11/2013  . Toenail fungus 11/15/2013  . Back pain 08/22/2013  . LBBB (left bundle branch block) 05/13/2013  . Shingles outbreak 12/15/2012  . Borderline diabetes   . Essential hypertension, benign 07/14/2012  . Syncope and collapse 02/15/2011  . Paresthesia of foot 07/07/2010  . INSOMNIA, CHRONIC, MILD 01/12/2008  . OVERWEIGHT 10/12/2007  . HLD (hyperlipidemia) 02/04/2007  . OSTEOPENIA 02/04/2007  . ANXIETY 10/12/2006  . OSTEOARTHRITIS 10/12/2006    Orientation ACTIVITIES/SOCIAL BLADDER RESPIRATION    Self, Place  Family supportive Continent Normal  BEHAVIORAL SYMPTOMS/MOOD NEUROLOGICAL BOWEL NUTRITION STATUS   (NONE)  (NONE) Continent Diet (Regular )  PHYSICIAN VISITS COMMUNICATION OF NEEDS Height & Weight Skin    Verbally 5\' 5"  (165.1 cm) 114 lbs. Skin  abrasions          AMBULATORY STATUS RESPIRATION    Assist independent Normal      Personal Care Assistance Level of Assistance  Bathing, Dressing Bathing Assistance: Limited assistance   Dressing Assistance: Limited assistance      Functional Limitations Info   (NONE)             [place]  PT (By licensed PT), OT (By licensed OT)     PT Frequency: 3 OT Frequency: 3           Additional Factors Info  Code Status, Allergies Code Status Info: FULL CODE  Allergies Info: N/A           Current Medications (01/28/2015): Current Facility-Administered Medications  Medication Dose Route Frequency Provider Last Rate Last Dose  . acetaminophen (TYLENOL) tablet 650 mg  650 mg Oral Q4H PRN Kary Kos, MD   650 mg at 01/27/15 0809   Or  . acetaminophen (TYLENOL) suppository 650 mg  650 mg Rectal Q4H PRN Kary Kos, MD      . HYDROcodone-acetaminophen (NORCO/VICODIN) 5-325 MG per tablet 1-2 tablet  1-2 tablet Oral Q4H PRN Ashok Pall, MD   1 tablet at 01/28/15 0808  . HYDROmorphone (DILAUDID) injection 0.5 mg  0.5 mg Intravenous Q2H PRN Kary Kos, MD      . labetalol (NORMODYNE,TRANDATE) injection 10-40 mg  10-40 mg Intravenous Q10 min PRN Kary Kos, MD      . metoprolol tartrate (LOPRESSOR) tablet 25 mg  25 mg Oral BID Cherene Altes, MD   25 mg at 01/28/15 1002  .  ondansetron (ZOFRAN) injection 4 mg  4 mg Intravenous Q4H PRN Cherene Altes, MD   4 mg at 01/28/15 0808  . oxyCODONE (Oxy IR/ROXICODONE) immediate release tablet 5-10 mg  5-10 mg Oral Q4H PRN Ashok Pall, MD      . PARoxetine (PAXIL) tablet 10 mg  10 mg Oral Daily Kary Kos, MD   10 mg at 01/28/15 1002  . pyridostigmine (MESTINON) tablet 60 mg  60 mg Oral TID Kary Kos, MD   60 mg at 01/28/15 1002  . senna-docusate (Senokot-S) tablet 1 tablet  1 tablet Oral BID Kary Kos, MD   1 tablet at 01/28/15 1002   Do not use this list as official medication orders. Please verify with discharge  summary.  Discharge Medications:   Medication List    ASK your doctor about these medications        ALPRAZolam 0.25 MG tablet  Commonly known as:  XANAX  Take 1-2 tablets (0.25-0.5 mg total) by mouth at bedtime as needed for sleep.     calcium carbonate 600 MG Tabs tablet  Commonly known as:  OS-CAL  Take 1,200 mg by mouth daily with breakfast.     cholecalciferol 1000 UNITS tablet  Commonly known as:  VITAMIN D  Take 2,000 Units by mouth daily.     hydrocortisone 2.5 % cream  Apply topically 2 (two) times daily.     metoprolol tartrate 25 MG tablet  Commonly known as:  LOPRESSOR  Take 1 tablet (25 mg total) by mouth 2 (two) times daily.     PARoxetine 10 MG tablet  Commonly known as:  PAXIL  Take 10 mg by mouth daily.     pyridostigmine 60 MG tablet  Commonly known as:  MESTINON  Take 1 tablet (60 mg total) by mouth 3 (three) times daily.        Relevant Imaging Results:  Relevant Lab Results:  Recent Labs    Additional Information SSN [id number]  Glendon Axe, MSW, LCSWA [phone] 01/28/2015 10:28 AM

## 2015-01-28 NOTE — PMR Pre-admission (Signed)
PMR Admission Coordinator Pre-Admission Assessment  Patient: Mandy Durham is an 69 y.o., female MRN: [id number] DOB: 11-17-1945 Height: 5' 5.5" (166.4 cm) Weight: 51.71 kg (114 lb)              Insurance Information HMO:     PPO:      PCP:      IPA:      80/20:      OTHER: Medicare Replacement policy PRIMARY: A[employer]  Medicare       Policy#:  [id number]      Subscriber:  self CM Name:  Su Hoff      Phone#:  [phone] Rhunette Croft [phone]     Fax#: [phone] Pre-Cert#: [id number]      Employer: retired Benefits:  Phone #:  [phone]     Name:  Melody Eff. Date:  03/16/14     Deduct:  $0      Out of Pocket Max:  $4950 (met $271.86)      Life Max:  unlimited CIR:  $312 per day with max of $1560      SNF:  $0 days 1-20; $160 daily, days 21-100 Outpatient:  $40 copay per visit     Co-Pay: based on medical necessity  Home Health:  100%      Co-Pay:  DME: 80%     Co-Pay:  20% Providers:  In network  Medicaid Application Date:       Case Manager:  Disability Application Date:       Case Worker:    Emergency Contact Information Contact Information    Name Relation Home Work Mobile   Pinette,Eddie Spouse   [phone]     Current Medical History  Patient Admitting Diagnosis: traumatic right fronto-temporal subdural hemorrhage, right frontal intraparenchymal hemorrhage  History of Present Illness: Mandy Durham is a 69 y.o. right handed female with history of HTN, anxiety, LBBB, syncope and presyncope who was admitted on 01/21/2015 after striking her head due to fall with amnesia of events and complaints of HA. Patient has history of orthostatic hypotension that had improved on mestinon. She was evaluated in ED and CT of the head revealed inferior right frontal hemorrhagic contusion, non-depressed occipital skull fracture, and moderate SAH around R-sylvian fissure. CTA head negative for aneurysm, occlusion or stenosis. Dr. Saintclair Halsted evaluated patient and recommended conservative management with  serial CCT. Cardiac echo done revealing EF 50-55% with anteroseptal hypokinesis, calcified MV and grade 1 diastolic dysfunction--no change since last study. Carotid dopplers without significant ICA stenosis. Last CCT with increase in edema and size of right frontal hematoma and was treated with short course of steroids. She has had confusion that's resolving with addition of Seroquel. She continues to have unsteady gait with nausea and vertigo due to BPPV. CIR recommended by MD and rehab team.  Total: 0=NIH  Past Medical History  Past Medical History  Diagnosis Date  . Insomnia   . Adenomatous polyp of colon   . Overweight(278.02)   . Symptomatic menopausal or female climacteric states   . Hyperlipidemia   . Osteoarthrosis, unspecified whether generalized or localized, unspecified site   . Anxiety state, unspecified   . Hypertension   . Syncope and collapse     11/12: Holter 12/12 with rare PACS, HR range 64-140, average 82, no significant arrhythmias. Echo (1/13): EF 50-55%, mild LVH, septal-lateral dyssynchrony c/w LBBB. 3-week event monitor (1/13): No significant arrhythmia.   Marland Kitchen LBBB (left bundle branch block)     LHC in 2002 showed  normal coronaries.     Family History  family history includes Coronary artery disease in her mother; Dementia in her mother; Diabetes in her mother; Heart failure in her mother. There is no history of Colon cancer, Breast cancer, Hypertension, Heart disease, Heart attack, or Stroke.  Prior Rehab/Hospitalizations:  Has the patient had major surgery during 100 days prior to admission? No  Current Medications   Current facility-administered medications:  .  acetaminophen (TYLENOL) tablet 650 mg, 650 mg, Oral, Q4H PRN, 650 mg at 01/27/15 0809 **OR** acetaminophen (TYLENOL) suppository 650 mg, 650 mg, Rectal, Q4H PRN, Kary Kos, MD .  HYDROcodone-acetaminophen (NORCO/VICODIN) 5-325 MG per tablet 1-2 tablet, 1-2 tablet, Oral, Q4H PRN, Ashok Pall, MD, 1  tablet at 01/28/15 0808 .  HYDROmorphone (DILAUDID) injection 0.5 mg, 0.5 mg, Intravenous, Q2H PRN, Kary Kos, MD .  labetalol (NORMODYNE,TRANDATE) injection 10-40 mg, 10-40 mg, Intravenous, Q10 min PRN, Kary Kos, MD .  metoprolol tartrate (LOPRESSOR) tablet 25 mg, 25 mg, Oral, BID, Cherene Altes, MD, 25 mg at 01/28/15 1002 .  ondansetron (ZOFRAN) injection 4 mg, 4 mg, Intravenous, Q4H PRN, Cherene Altes, MD, 4 mg at 01/28/15 0808 .  oxyCODONE (Oxy IR/ROXICODONE) immediate release tablet 5-10 mg, 5-10 mg, Oral, Q4H PRN, Ashok Pall, MD .  PARoxetine (PAXIL) tablet 10 mg, 10 mg, Oral, Daily, Kary Kos, MD, 10 mg at 01/28/15 1002 .  pyridostigmine (MESTINON) tablet 60 mg, 60 mg, Oral, TID, Kary Kos, MD, 60 mg at 01/28/15 1002 .  senna-docusate (Senokot-S) tablet 1 tablet, 1 tablet, Oral, BID, Kary Kos, MD, 1 tablet at 01/28/15 1002  Patients Current Diet: Diet regular Room service appropriate?: Yes; Fluid consistency:: Thin  Precautions / Restrictions Precautions Precautions: Fall Restrictions Weight Bearing Restrictions: No   Has the patient had 2 or more falls or a fall with injury in the past year?No  Prior Activity Level Community (5-7x/wk): Per husband, pt. is out of the home most days doing errands, grocery shopping  [place] Devices/Equipment: None Home Equipment: None  Prior Device Use: Indicate devices/aids used by the patient prior to current illness, exacerbation or injury? None of the above  Prior Functional Level Prior Function Level of Independence: Independent Comments: including driving and IADLs  Self Care: Did the patient need help bathing, dressing, using the toilet or eating?  Independent  Indoor Mobility: Did the patient need assistance with walking from room to room (with or without device)? Independent  Stairs: Did the patient need assistance with internal or external stairs (with or without device)?  Independent  Functional Cognition: Did the patient need help planning regular tasks such as shopping or remembering to take medications? Independent  Current Functional Level Cognition  Overall Cognitive Status: Impaired/Different from baseline Current Attention Level: Sustained Orientation Level: Oriented X4 Following Commands: Follows one step commands with increased time Safety/Judgement: Decreased awareness of safety, Decreased awareness of deficits General Comments: required repetition of instructions (multiple times) during mobility    Extremity Assessment (includes Sensation/Coordination)  Upper Extremity Assessment: Overall WFL for tasks assessed  Lower Extremity Assessment: Generalized weakness    ADLs  Overall ADL's : Needs assistance/impaired Eating/Feeding: Independent, Sitting Grooming: Wash/dry hands, Min guard, Standing Upper Body Bathing: Set up, Sitting Lower Body Bathing: Min guard, Sit to/from stand Upper Body Dressing : Set up, Sitting Lower Body Dressing: Min guard, Sit to/from stand Toilet Transfer: Minimal assistance, Ambulation, Regular Toilet, Grab bars Toileting- Clothing Manipulation and Hygiene: Min guard, Sit to/from stand  Mobility  Overal bed mobility: Needs Assistance Bed Mobility: Rolling, Sidelying to Sit, Sit to Supine Rolling: Min assist Sidelying to sit: Mod assist Sit to supine: Min assist General bed mobility comments: vc for sequencing; incr time; assist due to vertigo and fear of falling (rolling); assist to sit due to imbalance    Transfers  Overall transfer level: Needs assistance Equipment used: 1 person hand held assist Transfers: Sit to/from Stand Sit to Stand: Min assist General transfer comment: feaf of falling; moves slowly    Ambulation / Gait / Stairs / Wheelchair Mobility  Ambulation/Gait Ambulation/Gait assistance: Museum/gallery curator (Feet): 60 Feet Assistive device: 1 person hand held assist Gait  Pattern/deviations: Step-through pattern, Decreased stride length, Wide base of support General Gait Details: ambulated prior to testing for BPPV in Lt ear; very fearful but denied vertigo when walking Gait velocity: very slow    Posture / Balance Dynamic Sitting Balance Sitting balance - Comments: vertigo Balance Overall balance assessment: Needs assistance, History of Falls Sitting-balance support: Bilateral upper extremity supported, Feet supported Sitting balance-Leahy Scale: Poor Sitting balance - Comments: vertigo Postural control: Left lateral lean, Posterior lean Standing balance support: Single extremity supported Standing balance-Leahy Scale: Poor    Special needs/care consideration BiPAP/CPAP   no CPM no Continuous Drip IV no Dialysis  no        Days   Life Vest  no Oxygen  no Special Bed  no Trach Size  no Wound Vac (area) no      Location Skin  no                            Location  Bowel mgmt: 01/27/15 last BM, continent; to bathroom with assist Bladder mgmt: continent, to bathroom with assist  Diabetic mgmt no     Previous Home Environment Living Arrangements: Spouse/significant other Available Help at Discharge: Family, Available 24 hours/day Bathroom Shower/Tub: Gaffer Home Care Services: No  Discharge Living Setting Plans for Discharge Living Setting: Patient's home Type of Home at Discharge: House Discharge Home Layout: One level Discharge Home Access: Stairs to enter Entrance Stairs-Rails: None Entrance Stairs-Number of Steps: 3 Discharge Bathroom Shower/Tub: Walk-in shower Discharge Bathroom Toilet: Handicapped height Discharge Bathroom Accessibility: Yes How Accessible: Accessible via walker Does the patient have any problems obtaining your medications?: No  Social/Family/Support Systems Patient Roles: Spouse, Parent Anticipated Caregiver: husband, Corabelle Spackman Anticipated Caregiver's Contact Information: Jillayne Witte  [phone] Ability/Limitations of Caregiver: husband is available to assist pt. as needed  Caregiver Availability: 24/7 Discharge Plan Discussed with Primary Caregiver: Yes Is Caregiver In Agreement with Plan?: Yes Does Caregiver/Family have Issues with Lodging/Transportation while Pt is in Rehab?: No  Goals/Additional Needs Patient/Family Goal for Rehab: modified independent PT/OT, modified independent and supervision for SLP Expected length of stay: 7-10 days Cultural Considerations: none Dietary Needs: regular diet, thin liquids Equipment Needs: TBA Pt/Family Agrees to Admission and willing to participate: Yes Program Orientation Provided & Reviewed with Pt/Caregiver Including Roles  & Responsibilities: Yes  Decrease burden of Care through IP rehab admission: no  Possible need for SNF placement upon discharge:   Not anticipated  Patient Condition: This patient's medical and functional status has changed since the consult dated: 01/25/15 in which the Rehabilitation Physician determined and documented that the patient's condition is appropriate for intensive rehabilitative care in an inpatient rehabilitation facility. See "History of Present Illness" (above) for medical update. Functional changes are: Currently requiring min  assist transfers and min assist 60 ft +1 HHA. Patient's medical and functional status update has been discussed with the Rehabilitation physician and patient remains appropriate for inpatient rehabilitation. Will admit to inpatient rehab today.  Preadmission Screen Completed By:  Retta Diones, 01/28/2015 3:02 PM ______________________________________________________________________   Discussed status with Dr. Posey Pronto on 01/28/15 at 1500 and received telephone approval for admission today.  Admission Coordinator:  Retta Diones, time1500/Date11/14/16

## 2015-01-28 NOTE — Progress Notes (Signed)
Physical Therapy Treatment Patient Details Name: Mandy Durham MRN: [id number] DOB: 06/02/1945 Today's Date: 01/28/2015    History of Present Illness pt presents after fall sustaining R Frotnal SDH.  pt with hx of syncope, HTN, and Orthostatic hypotension.      PT Comments    Patient reported some vertigo after morning session. Reports it goes away when she is still. + BPPV with Lt Hallpike-Dix and performed Epley maneuver. Pt very nauseous and did not reassess Rt ear at this time. Will follow-up 11/15   Follow Up Recommendations  CIR     Equipment Recommendations  Rolling walker with 5" wheels    Recommendations for Other Services       Precautions / Restrictions Precautions Precautions: Fall Restrictions Weight Bearing Restrictions: No    Mobility  Bed Mobility Overal bed mobility: Needs Assistance Bed Mobility: Rolling;Sidelying to Sit;Sit to Supine Rolling: Min assist Sidelying to sit: Mod assist   Sit to supine: Min assist   General bed mobility comments: vc for sequencing; incr time; assist due to vertigo and fear of falling (rolling); assist to sit due to imbalance  Transfers Overall transfer level: Needs assistance Equipment used: 1 person hand held assist Transfers: Sit to/from Stand Sit to Stand: Min assist         General transfer comment: feaf of falling; moves slowly  Ambulation/Gait Ambulation/Gait assistance: Min assist Ambulation Distance (Feet): 60 Feet Assistive device: 1 person hand held assist Gait Pattern/deviations: Step-through pattern;Decreased stride length;Wide base of support Gait velocity: very slow   General Gait Details: ambulated prior to testing for BPPV in Lt ear; very fearful but denied vertigo when walking   Stairs            Wheelchair Mobility    Modified Rankin (Stroke Patients Only)       Balance     Sitting balance-Leahy Scale: Poor Sitting balance - Comments: vertigo     Standing balance-Leahy  Scale: Poor                      Cognition Arousal/Alertness: Awake/alert Behavior During Therapy: Flat affect Overall Cognitive Status: Impaired/Different from baseline Area of Impairment: Problem solving       Following Commands: Follows one step commands with increased time     Problem Solving: Slow processing;Difficulty sequencing;Requires verbal cues;Requires tactile cues General Comments: required repetition of instructions (multiple times) during mobility    Exercises      General Comments General comments (skin integrity, edema, etc.): Lt hallpike dix + (unable to see nystagmus position 1; strong nystagmus with position 2 and 4 of cannalith repositioing      Pertinent Vitals/Pain Pain Assessment: No/denies pain    Home Living                      Prior Function            PT Goals (current goals can now be found in the care plan section) Acute Rehab PT Goals Patient Stated Goal: to go home and be able to drive Time For Goal Achievement: 02/08/15 Progress towards PT goals: Progressing toward goals    Frequency  Min 3X/week    PT Plan Current plan remains appropriate    Co-evaluation             End of Session Equipment Utilized During Treatment: Gait belt Activity Tolerance: Treatment limited secondary to medical complications (Comment) (vertigo and nausea) Patient left: with call bell/phone  within reach;with family/visitor present;in bed;with bed alarm set (HOB fully elevated)     Time: 1416-1440 PT Time Calculation (min) (ACUTE ONLY): 24 min  Charges:  $Gait Training: 8-22 mins $Therapeutic Activity: 8-22 mins                    G Codes:      Alexzandrea Normington 2015-02-07, 2:49 PM  Pager [phone]

## 2015-01-28 NOTE — Clinical Social Work Note (Signed)
Clinical Social Worker notified that [employer] has insurance approval to admit patient to [facility] today if medically stable.   Clinical Social Worker will sign off for now as social work intervention is no longer needed. Please consult [place] again if new need arises.  Glendon Axe, MSW, LCSWA [phone] 01/28/2015 2:53 PM

## 2015-01-28 NOTE — Progress Notes (Addendum)
Physical Therapy Treatment Patient Details Name: Mandy Durham MRN: [id number] DOB: [birth year] Today's Date: 01/28/2015    History of Present Illness pt presents after fall sustaining R Frotnal SDH.  pt with hx of syncope, HTN, and Orthostatic hypotension.      PT Comments    See also vestibular evaluation (next progress note). Pt reported +vertigo and nausea with movement. Treated for Rt posterior canal BPPV, however pt very nauseous with repositioning maneuver (despite premedication with Zofran) and unable to test Lt side for BPPV. (? Lt anterior canal involvement)  Will attempt to see again later today for re-assessment. Pt very fearful of falling and remains very unsteady (up to mod assist for balance).    Follow Up Recommendations  CIR     Equipment Recommendations  Rolling walker with 5" wheels    Recommendations for Other Services       Precautions / Restrictions Precautions Precautions: Fall Restrictions Weight Bearing Restrictions: No    Mobility  Bed Mobility Overal bed mobility: Needs Assistance Bed Mobility: Rolling;Sidelying to Sit Rolling: Min assist Sidelying to sit: Mod assist       General bed mobility comments: vc for sequencing; incr time; assist due to vertigo and fear of falling (rolling); assist to sit due to imbalance  Transfers Overall transfer level: Needs assistance Equipment used: 1 person hand held assist Transfers: Sit to/from Stand Sit to Stand: Min assist         General transfer comment: feaf of falling; moves slowly  Ambulation/Gait Ambulation/Gait assistance: Mod assist;Min assist Ambulation Distance (Feet): 10 Feet Assistive device: 1 person hand held assist Gait Pattern/deviations: Step-through pattern;Decreased stride length;Shuffle;Leaning posteriorly;Wide base of support Gait velocity: very slwo   General Gait Details: initial steps mod assist due to imbalance; progressed to min assist, however very wide BOS and  shuffling steps   Stairs            Wheelchair Mobility    Modified Rankin (Stroke Patients Only)       Balance Overall balance assessment: Needs assistance;History of Falls Sitting-balance support: Bilateral upper extremity supported;Feet supported Sitting balance-Leahy Scale: Poor Sitting balance - Comments: vertigo Postural control: Left lateral lean;Posterior lean Standing balance support: Single extremity supported Standing balance-Leahy Scale: Poor                      Cognition Arousal/Alertness: Awake/alert Behavior During Therapy: Flat affect Overall Cognitive Status: Impaired/Different from baseline Area of Impairment: Problem solving       Following Commands: Follows one step commands with increased time     Problem Solving: Slow processing;Difficulty sequencing;Requires verbal cues;Requires tactile cues General Comments: required repetition of instructions (multiple times) during mobility    Exercises      General Comments        Pertinent Vitals/Pain Pain Assessment: 0-10 Pain Score: 5  Pain Location: headache Pain Descriptors / Indicators: Aching Pain Intervention(s): Limited activity within patient's tolerance;Monitored during session;Premedicated before session;Repositioned    Home Living                      Prior Function            PT Goals (current goals can now be found in the care plan section) Acute Rehab PT Goals Patient Stated Goal: to go home and be able to drive Time For Goal Achievement: 02/08/15 Progress towards PT goals: Not progressing toward goals - comment (vertigo and nausea)    Frequency  Min  3X/week    PT Plan Current plan remains appropriate    Co-evaluation             End of Session Equipment Utilized During Treatment: Gait belt Activity Tolerance: Treatment limited secondary to medical complications (Comment) (vertigo and nausea) Patient left: in chair;with call bell/phone  within reach;with family/visitor present     Time: [id number] PT Time Calculation (min) (ACUTE ONLY): 43 min  Charges:  $Therapeutic Activity: 8-22 mins $Canalith Rep Proc: 8-22 mins $Physical Performance Test: 8-22 mins                    G Codes:      Mercades Bajaj February 05, 2015, 9:52 AM Pager [phone]

## 2015-01-28 NOTE — Progress Notes (Signed)
Inpatient Rehabilitation  Pt. just seen by PT.  Pt. now with symptoms of BPPV from fall, PT is addressing.  I updated pt. and family that I have submitted for insurance authorization for CIR and their decision is pending.  I have contacted Despina Pole, SW for SNF back up as Mr. Malave does not believe he could care for his wife at this time if CIR does not work out.  I will update the team when I have a response from [facility].  Please call if questions.  [place] Admissions Coordinator Cell [phone] Office [phone]

## 2015-01-28 NOTE — Discharge Summary (Signed)
DISCHARGE SUMMARY  Mandy Durham  MR#: [id number]  DOB:07/05/1945  Date of Admission: 01/21/2015 Date of Discharge: 01/28/2015  Attending Physician:MCCLUNG,JEFFREY T  Patient's [id number] Mandy Kells, MD  Consults:  Neurosurgery   Disposition: D/C to CIR   Follow-up Appts:  To be scheduled at time of D/C from CIR  Discharge Diagnoses: Traumatic SAH Intractable nausea with positional vertigo ICU/steroid-induced delirium Syncope and collapse - Orthostatic hypotension  Possible adrenal insufficiency Mild hypokalemia Essential hypertension  Initial presentation: 69 y.o. female w/ a history of Insomnia; Adenomatous polyp of colon; Hyperlipidemia; OA; Hypertension; Syncope; and LBBB who presented with a fall in her kitchen. Her husband heard her fall and found her unconscious. She was standing at the sink for about 3 min and felt lightheaded. She called out his name prior to hitting the floor. Patient fell backwards hitting her head. She has hx of orthostatic hypotension in the past. Her prior episodes of syncope have often occurred after prolonged standing. Patient in the past was treated with Florinef but failed therapy. She was started on Mestinon 1 year ago and did well for a while although she still had occasional episodes of lightheadedness. Patient has been followed for this by Cardiology. Her last echogram was 2014 showing EF of 50-55% with mild LVH and septal lateral dyssynchrony with the bundle-branch block and no significant arrhythmia noted on Holter monitor. Cardiology was worried about autonomic insufficiency. She reported good intake prior, and denied shortness of breath, chest pain, leg swelling. On Arrival to ER she was found to have 3 mm subarachnoid hemorrhage, and was admitted to the NS service.   Hospital Course:  Traumatic SAH NS followed this issue th/o the hospital stay - surgical intervention was not required   Intractable nausea with positional vertigo Felt to  be part of concussion symptoms - trial of steroid initiated per NS to which the patient's symptoms responded well, but with which pt developed severe acute delirium - continues to improve steadily but slowly now off steroids   ICU/steroid-induced delirium Much improved after d/c of steroids - no further evaluation indicated but will need supportive care   Syncope and collapse - Orthostatic hypotension  No change in TTE compared to most recent prior - carotid dopplers w/o evidence of signif stenosis B - a recurring issue being followed by [facility] as outpt - given the apparent extent of the prior outpatient workup and the chronic nature of this issue further testing as an inpatient is not likely to prove productive - patient should follow-up with her Cardiologist who has been monitoring this issue  Possible adrenal insufficiency AM cortisol very low at 1.8 11/10 but a recheck normal 11/12 - I do not see evidence in Epic that this has been previously checked - further evaluation into this should be considered in the outpatient setting once far removed from recent steroid dosing  Mild hypokalemia Resolved with supplementation  Essential hypertension BP reasonably controlled at time of d/c   D/C Medications: To be determined at time pt is d/c from CIR   Day of Discharge BP 125/67 mmHg  Pulse 58  Temp(Src) 98.2 F (36.8 C) (Oral)  Resp 18  Ht 5' 5.5" (1.664 m)  Wt 51.71 kg (114 lb)  BMI 18.68 kg/m2  SpO2 99%  Physical Exam: General: No acute respiratory distress Lungs: Clear to auscultation bilaterally without wheezes or crackles Cardiovascular: Regular rate and rhythm without murmur gallop or rub normal S1 and S2 Abdomen: Nontender, nondistended, soft, bowel sounds positive, no  rebound, no ascites, no appreciable mass Extremities: No significant cyanosis, clubbing, or edema bilateral lower extremities  Basic Metabolic Panel:  Recent Labs Lab 01/21/15 2038 01/22/15 1100  01/24/15 0330 01/25/15 0924 01/26/15 0321  NA 138 136 138 138 139  K 4.0 3.3* 4.4 3.3* 3.9  CL 105 102 104 106 102  CO2 27 25 26 24 28   GLUCOSE 150* 125* 135* 101* 101*  BUN 13 11 10 11 14   CREATININE 0.70 0.76 0.67 0.61 0.73  CALCIUM 8.8* 8.8* 9.1 8.6* 8.9  MG  --  1.9 1.8  --   --     Liver Function Tests:  Recent Labs Lab 01/22/15 1100 01/26/15 0321  AST 27 22  ALT 21 17  ALKPHOS 73 60  BILITOT 0.5 0.5  PROT 6.5 6.1*  ALBUMIN 3.2* 2.8*    Recent Labs Lab 01/26/15 0321  AMMONIA 23   CBC:  Recent Labs Lab 01/21/15 2038 01/22/15 1100  WBC 13.2* 11.1*  NEUTROABS  --  9.1*  HGB 12.8 13.1  HCT 39.0 39.5  MCV 85.2 85.9  PLT 304 307    Cardiac Enzymes:  Recent Labs Lab 01/22/15 0441 01/22/15 1100 01/22/15 1600  TROPONINI <0.03 <0.03 <0.03    CBG:  Recent Labs Lab 01/21/15 1954  GLUCAP 141*    Recent Results (from the past 240 hour(s))  MRSA PCR Screening     Status: None   Collection Time: 01/22/15  4:48 AM  Result Value Ref Range Status   MRSA by PCR NEGATIVE NEGATIVE Final    Comment:        The GeneXpert MRSA Assay (FDA approved for NASAL specimens only), is one component of a comprehensive MRSA colonization surveillance program. It is not intended to diagnose MRSA infection nor to guide or monitor treatment for MRSA infections.      Time spent in discharge (includes decision making & examination of pt): 35 minutes  01/28/2015, 3:06 PM   Cherene Altes, MD [facility] Office  [phone] Pager [phone]  On-Call/Text Page:      [email or web address]      password [facility]

## 2015-01-29 ENCOUNTER — Inpatient Hospital Stay (HOSPITAL_COMMUNITY): Payer: Medicare HMO | Admitting: Occupational Therapy

## 2015-01-29 ENCOUNTER — Inpatient Hospital Stay (HOSPITAL_COMMUNITY): Payer: Medicare HMO | Admitting: Speech Pathology

## 2015-01-29 ENCOUNTER — Inpatient Hospital Stay (HOSPITAL_COMMUNITY): Payer: Medicare HMO | Admitting: Physical Therapy

## 2015-01-29 DIAGNOSIS — S069X3S Unspecified intracranial injury with loss of consciousness of 1 hour to 5 hours 59 minutes, sequela: Secondary | ICD-10-CM

## 2015-01-29 LAB — CBC WITH DIFFERENTIAL/PLATELET
Basophils Absolute: 0 10*3/uL (ref 0.0–0.1)
Basophils Relative: 0 %
Eosinophils Absolute: 0.2 10*3/uL (ref 0.0–0.7)
Eosinophils Relative: 2 %
HEMATOCRIT: 40.6 % (ref 36.0–46.0)
HEMOGLOBIN: 13.4 g/dL (ref 12.0–15.0)
LYMPHS ABS: 2.1 10*3/uL (ref 0.7–4.0)
LYMPHS PCT: 23 %
MCH: 28 pg (ref 26.0–34.0)
MCHC: 33 g/dL (ref 30.0–36.0)
MCV: 84.8 fL (ref 78.0–100.0)
MONOS PCT: 7 %
Monocytes Absolute: 0.7 10*3/uL (ref 0.1–1.0)
NEUTROS ABS: 6.4 10*3/uL (ref 1.7–7.7)
NEUTROS PCT: 68 %
Platelets: 386 10*3/uL (ref 150–400)
RBC: 4.79 MIL/uL (ref 3.87–5.11)
RDW: 13.1 % (ref 11.5–15.5)
WBC: 9.4 10*3/uL (ref 4.0–10.5)

## 2015-01-29 LAB — COMPREHENSIVE METABOLIC PANEL
ALK PHOS: 73 U/L (ref 38–126)
ALT: 17 U/L (ref 14–54)
ANION GAP: 8 (ref 5–15)
AST: 18 U/L (ref 15–41)
Albumin: 3 g/dL — ABNORMAL LOW (ref 3.5–5.0)
BILIRUBIN TOTAL: 0.7 mg/dL (ref 0.3–1.2)
BUN: 15 mg/dL (ref 6–20)
CALCIUM: 9.1 mg/dL (ref 8.9–10.3)
CO2: 29 mmol/L (ref 22–32)
CREATININE: 0.88 mg/dL (ref 0.44–1.00)
Chloride: 100 mmol/L — ABNORMAL LOW (ref 101–111)
GFR calc non Af Amer: >60 mL/min (ref >60)
GLUCOSE: 108 mg/dL — AB (ref 65–99)
Potassium: 4 mmol/L (ref 3.5–5.1)
Sodium: 137 mmol/L (ref 135–145)
Total Protein: 6.2 g/dL — ABNORMAL LOW (ref 6.5–8.1)

## 2015-01-29 MED ORDER — TOPIRAMATE 25 MG PO TABS
25.0000 mg | ORAL_TABLET | Freq: Every day | ORAL | Status: DC
  Administered 2015-01-29: 25 mg via ORAL
  Filled 2015-01-29 (×2): qty 1

## 2015-01-29 MED ORDER — ACETAMINOPHEN 325 MG PO TABS
650.0000 mg | ORAL_TABLET | Freq: Two times a day (BID) | ORAL | Status: DC
  Administered 2015-01-29 – 2015-02-05 (×14): 650 mg via ORAL
  Filled 2015-01-29 (×14): qty 2

## 2015-01-29 NOTE — Progress Notes (Signed)
Speech Language Pathology Daily Session Note  Patient Details  Name: Mandy Durham MRN: [id number] Date of Birth: Dec 26, 1945  Today's Date: 01/29/2015 SLP Individual Time: 1505-1530 SLP Individual Time Calculation (min): 25 min  Short Term Goals: Week 1: SLP Short Term Goal 1 (Week 1): Patient will demonstrate problem solving with complex and familiar tasks with supervision verbal cues.   SLP Short Term Goal 2 (Week 1): Patient will recall new, daily information with supervision verbal cues.  SLP Short Term Goal 3 (Week 1): Patient will organize and plan throughout functional tasks with supervision verbal cues.   Skilled Therapeutic Interventions:  Pt was seen for skilled ST targeting cognitive goals.  SLP facilitated the session with a semi-complex medication management task targeting functional problem solving for home management tasks.  Pt recall function of medications when named for ~75% accuracy with supervision question cues.  Pt required min faded to supervision verbal cues for planning and organization to load pills into a QD pill organizer.  Pt was left in bed with call bell left within reach.  Continue per current plan of care.     Function:  Eating Eating                 Cognition Comprehension Comprehension assist level: Follows basic conversation/direction with extra time/assistive device  Expression   Expression assist level: Expresses basic needs/ideas: With extra time/assistive device  Social Interaction Social Interaction assist level: Interacts appropriately with others - No medications needed.  Problem Solving Problem solving assist level: Solves basic 75 - 89% of the time/requires cueing 10 - 24% of the time  Memory Memory assist level: Recognizes or recalls 75 - 89% of the time/requires cueing 10 - 24% of the time    Pain Pain Assessment Pain Assessment: No/denies pain   Therapy/Group: Individual Therapy  Shye Doty, Selinda Orion 01/29/2015, 4:52 PM

## 2015-01-29 NOTE — Evaluation (Signed)
Speech Language Pathology Assessment and Plan  Patient Details  Name: Mandy Durham MRN: [id number] Date of Birth: [birth year]  SLP Diagnosis: Cognitive Impairments  Rehab Potential: Excellent ELOS: 7-10 days     Today's Date: 01/29/2015 SLP Individual Time: 1000-1100 SLP Individual Time Calculation (min): 60 min   Problem List:  Patient Active Problem List   Diagnosis Date Noted  . TBI (traumatic brain injury) ([place]) 01/28/2015  . Insomnia   . Hypoproteinemia ([place])   . Essential hypertension   . Acute post-traumatic headache, not intractable   . Skull fracture ([place])   . Vertigo due to brain injury ([place])   . Subarachnoid bleed ([place]) 01/22/2015  . SAH (subarachnoid hemorrhage) ([place]) 01/21/2015  . Annual physical exam 09/07/2014  . Orthostatic hypotension 08/30/2014  . Acromioclavicular arthrosis 03/21/2014  . Allergic rhinitis 02/27/2014  . SI (sacroiliac) joint dysfunction 02/02/2014  . Gastrocnemius strain, left 12/11/2013  . Toenail fungus 11/15/2013  . Back pain 08/22/2013  . LBBB (left bundle branch block) 05/13/2013  . Shingles outbreak 12/15/2012  . Borderline diabetes   . Essential hypertension, benign 07/14/2012  . Syncope and collapse 02/15/2011  . Paresthesia of foot 07/07/2010  . INSOMNIA, CHRONIC, MILD 01/12/2008  . OVERWEIGHT 10/12/2007  . HLD (hyperlipidemia) 02/04/2007  . OSTEOPENIA 02/04/2007  . ANXIETY 10/12/2006  . OSTEOARTHRITIS 10/12/2006   Past Medical History:  Past Medical History  Diagnosis Date  . Insomnia   . Adenomatous polyp of colon   . Overweight(278.02)   . Symptomatic menopausal or female climacteric states   . Hyperlipidemia   . Osteoarthrosis, unspecified whether generalized or localized, unspecified site   . Anxiety state, unspecified   . Hypertension   . Syncope and collapse     11/12: Holter 12/12 with rare PACS, HR range 64-140, average 82, no significant arrhythmias. Echo (1/13): EF 50-55%, mild LVH, septal-lateral  dyssynchrony c/w LBBB. 3-week event monitor (1/13): No significant arrhythmia.   Marland Kitchen LBBB (left bundle branch block)     LHC in 2002 showed normal coronaries.    Past Surgical History:  Past Surgical History  Procedure Laterality Date  . Tubal ligation      Assessment / Plan / Recommendation Clinical Impression Mandy Durham is a 69 y.o. right handed female with history of HTN, anxiety, LBBB, syncope and presyncope who was admitted on 01/21/2015 after striking her head due to fall with amnesia of events and complaints of HA.  Patient has history of orthostatic hypotension that had improved on mestinon.  She was evaluated in ED and CT of the head revealed inferior right frontal hemorrhagic contusion, non-depressed occipital skull fracture, and moderate SAH around R-sylvian fissure. CTA head negative for aneurysm, occlusion or stenosis. Dr. Saintclair Halsted evaluated patient and recommended conservative management with serial CCT.  Cardiac echo done revealing EF 50-55% with anteroseptal hypokinesis, calcified MV and grade 1 diastolic dysfunction--no change since last study. Carotid dopplers without significant ICA stenosis.  Last CCT with increase in edema and size of right frontal hematoma and was treated with short course of steroids. She has had confusion that's resolving with addition of Seroquel.  She continues to have unsteady gait with nausea and vertigo due to BPPV. CIR recommended by MD and rehab team and patient was admitted 01/28/15. Patient demonstrates behaviors consistent with a Rancho Level VII and requires impaired problem solving, recall and organization with mildly complex and familiar tasks. Patient would benefit from skilled SLP intervention to maximize cognitive function and overall functional independence prior to  discharge home.   Skilled Therapeutic Interventions          Administered a cognitive-linguistic evaluation, please see above for details. Patient also administered the MoCA and scored  12/22 points with a score of 18 above considered normal. Patient and her husband educated on patient's current cognitive impairments and goals of skilled SLP intervention, both verbalized understanding.   SLP Assessment  Patient will need skilled [place] [facility] during CIR admission    Recommendations  Oral Care Recommendations: Oral care BID Recommendations for Other Services: Neuropsych consult Patient destination: Home Follow up Recommendations: 24 hour supervision/assistance Equipment Recommended: None recommended by SLP    SLP Frequency 3 to 5 out of 7 days   SLP Treatment/Interventions Cognitive remediation/compensation;Cueing hierarchy;Patient/family education;Therapeutic Activities;Functional tasks;Environmental controls;Internal/external aids   Pain Pain Assessment Pain Assessment: No/denies pain Prior Functioning Type of Home: House  Lives With: Spouse Available Help at Discharge: Family;Available 24 hours/day Vocation: Retired  Function:   Cognition Comprehension Comprehension assist level: Follows basic conversation/direction with extra time/assistive device  Expression   Expression assist level: Expresses basic needs/ideas: With extra time/assistive device  Social Interaction Social Interaction assist level: Interacts appropriately with others - No medications needed.  Problem Solving Problem solving assist level: Solves basic 75 - 89% of the time/requires cueing 10 - 24% of the time  Memory Memory assist level: Recognizes or recalls 75 - 89% of the time/requires cueing 10 - 24% of the time   Short Term Goals: Week 1: SLP Short Term Goal 1 (Week 1): Patient will demonstrate problem solving with complex and familiar tasks with supervision verbal cues.   SLP Short Term Goal 2 (Week 1): Patient will recall new, daily information with supervision verbal cues.  SLP Short Term Goal 3 (Week 1): Patient will organize and plan throughout functional tasks  with supervision verbal cues.   Refer to Care Plan for Long Term Goals  Recommendations for other services: Neuropsych  Discharge Criteria: Patient will be discharged from SLP if patient refuses treatment 3 consecutive times without medical reason, if treatment goals not met, if there is a change in medical status, if patient makes no progress towards goals or if patient is discharged from hospital.  The above assessment, treatment plan, treatment alternatives and goals were discussed and mutually agreed upon: by patient and by family  Constantinos Krempasky 01/29/2015, 3:11 PM

## 2015-01-29 NOTE — Progress Notes (Signed)
Patient information reviewed and entered into eRehab system by Tannon Peerson, RN, CRRN, PPS Coordinator.  Information including medical coding and functional independence measure will be reviewed and updated through discharge.     Per nursing patient was given "Data Collection Information Summary for Patients in Inpatient Rehabilitation Facilities with attached "Privacy Act Statement-Health Care Records" upon admission.  

## 2015-01-29 NOTE — Evaluation (Signed)
Physical Therapy Assessment and Plan  Patient Details  Name: Mandy Durham MRN: [id number] Date of Birth: 07-18-1945  PT Diagnosis: Abnormality of gait, Cognitive deficits, Difficulty walking, Muscle weakness and Vertigo Rehab Potential: Excellent ELOS: 7-10 days   Today's Date: 01/29/2015 PT Individual Time: 0800-0900 and 1305-1400 PT Individual Time Calculation (min): 60 min and 55 min  Problem List:  Patient Active Problem List   Diagnosis Date Noted  . TBI (traumatic brain injury) ([place]) 01/28/2015  . Insomnia   . Hypoproteinemia ([place])   . Essential hypertension   . Acute post-traumatic headache, not intractable   . Skull fracture ([place])   . Vertigo due to brain injury ([place])   . Subarachnoid bleed ([place]) 01/22/2015  . SAH (subarachnoid hemorrhage) ([place]) 01/21/2015  . Annual physical exam 09/07/2014  . Orthostatic hypotension 08/30/2014  . Acromioclavicular arthrosis 03/21/2014  . Allergic rhinitis 02/27/2014  . SI (sacroiliac) joint dysfunction 02/02/2014  . Gastrocnemius strain, left 12/11/2013  . Toenail fungus 11/15/2013  . Back pain 08/22/2013  . LBBB (left bundle branch block) 05/13/2013  . Shingles outbreak 12/15/2012  . Borderline diabetes   . Essential hypertension, benign 07/14/2012  . Syncope and collapse 02/15/2011  . Paresthesia of foot 07/07/2010  . INSOMNIA, CHRONIC, MILD 01/12/2008  . OVERWEIGHT 10/12/2007  . HLD (hyperlipidemia) 02/04/2007  . OSTEOPENIA 02/04/2007  . ANXIETY 10/12/2006  . OSTEOARTHRITIS 10/12/2006    Past Medical History:  Past Medical History  Diagnosis Date  . Insomnia   . Adenomatous polyp of colon   . Overweight(278.02)   . Symptomatic menopausal or female climacteric states   . Hyperlipidemia   . Osteoarthrosis, unspecified whether generalized or localized, unspecified site   . Anxiety state, unspecified   . Hypertension   . Syncope and collapse     11/12: Holter 12/12 with rare PACS, HR range 64-140, average 82, no  significant arrhythmias. Echo (1/13): EF 50-55%, mild LVH, septal-lateral dyssynchrony c/w LBBB. 3-week event monitor (1/13): No significant arrhythmia.   Marland Kitchen LBBB (left bundle branch block)     LHC in 2002 showed normal coronaries.    Past Surgical History:  Past Surgical History  Procedure Laterality Date  . Tubal ligation      Assessment & Plan Clinical Impression: Mandy Durham is a 69 y.o. right handed female with history of HTN, anxiety, LBBB, syncope and presyncope who was admitted on 01/21/2015 after striking her head due to fall with amnesia of events and complaints of HA. Patient has history of orthostatic hypotension that had improved on mestinon. She was evaluated in ED and CT of the head revealed inferior right frontal hemorrhagic contusion, non-depressed occipital skull fracture, and moderate SAH around R-sylvian fissure. CTA head negative for aneurysm, occlusion or stenosis. Dr. Saintclair Halsted evaluated patient and recommended conservative management with serial CCT. Cardiac echo done revealing EF 50-55% with anteroseptal hypokinesis, calcified MV and grade 1 diastolic dysfunction--no change since last study. Carotid dopplers without significant ICA stenosis. Last CCT with increase in edema and size of right frontal hematoma and was treated with short course of steroids. She has had confusion that's resolving with addition of Seroquel. She continues to have unsteady gait with nausea and vertigo due to BPPV. Patient transferred to CIR on 01/28/2015.   Patient currently requires min with mobility secondary to muscle weakness, decreased cardiorespiratoy endurance, peripheral and decreased standing balance, decreased postural control and decreased balance strategies.  Prior to hospitalization, patient was independent  with mobility and lived with Spouse in a  home.  Home access is 3Stairs to enter.  Patient will benefit from skilled PT intervention to maximize safe functional mobility, minimize  fall risk and decrease caregiver burden for planned discharge home with 24 hour supervision.  Anticipate patient will benefit from follow up OP at discharge.  PT - End of Session Activity Tolerance: Tolerates 30+ min activity with multiple rests Endurance Deficit: Yes Endurance Deficit Description: required seated rest breaks with mobility PT Assessment Rehab Potential (ACUTE/IP ONLY): Excellent PT Patient demonstrates impairments in the following area(s): Balance;Endurance;Motor;Pain;Safety PT Transfers Functional Problem(s): Bed Mobility;Bed to Chair;Car;Furniture;Floor PT Locomotion Functional Problem(s): Ambulation;Wheelchair Mobility;Stairs PT Plan PT Intensity: Minimum of 1-2 x/day ,45 to 90 minutes PT Frequency: 5 out of 7 days PT Duration Estimated Length of Stay: 7-10 days PT Treatment/Interventions: Ambulation/gait training;Balance/vestibular training;Cognitive remediation/compensation;Community reintegration;Discharge planning;Disease management/prevention;DME/adaptive equipment instruction;Functional mobility training;Neuromuscular re-education;Pain management;Patient/family education;Psychosocial support;Stair training;Therapeutic Activities;Therapeutic Exercise;UE/LE Strength taining/ROM;UE/LE Coordination activities PT Transfers Anticipated Outcome(s): mod I PT Locomotion Anticipated Outcome(s): supervision PT Recommendation Recommendations for Other Services: Vestibular eval Follow Up Recommendations: Outpatient PT;24 hour supervision/assistance Patient destination: Home Equipment Recommended: To be determined Equipment Details: pt reports husband may have RW or cane  Skilled Therapeutic Intervention Treatment 1: Skilled therapeutic intervention initiated after completion of evaluation. Discussed with patient falls risk, safety within room, and focus of therapy during stay. Discussed possible length of stay, goals, and follow-up therapy. Patient with complaints of dizziness  consistent with BPPV with bed mobility resolving within 45-60 sec and with sit > stand towards end of session, requiring seated then supine rest break due to patient c/o feeling "faint," see vitals below. Retrieved 18x16 wheelchair and cushion and patient returned to room with all needs within reach.   Treatment 2: Patient in room with husband and pastor, reporting 4/10 dizziness that worsened to 5/10 with backwards gait and improved to 3/10 with seated rest. Patient ambulated throughout rehab unit from room > ortho gym > main gym including uneven surfaces with min A overall and no AD. Performed simulated car transfer to sedan height with min guard and no AD. See outcome measures completed below. Patient required frequent seated rest breaks during standing outcome measurements due to fatigue and dizziness. Patient ambulated back to room with close supervision and left sitting in arm chair with husband present, discussed recommendations for calling nursing staff for assistance in room and 24/7 supervision upon discharge when ambulating, patient and husband verbalized understanding and in agreement with recommendations.   10 MWT no AD, supervision = 0.37 m/s 5TSS without UE support = 11 sec TUG cognitive average of 3 trials with supervision =  18.3 sec with multiple errors on first trial improved to 1 error on last trial FGA = 13/30  PT Evaluation Precautions/Restrictions Precautions Precautions: Fall Restrictions Weight Bearing Restrictions: No General Chart Reviewed: Yes Family/Caregiver Present: No  Vital SignsTherapy Vitals Pulse Rate: 73 BP: (!) 114/41 mmHg Patient Position (if appropriate): Lying Oxygen Therapy SpO2: 95 % O2 Device: Not Delivered Pain Pain Assessment Pain Assessment: 0-10 Pain Score: 1  Pain Type: Acute pain Pain Location: Head Pain Descriptors / Indicators: Headache Pain Onset: On-going Pain Intervention(s): Emotional support Home Living/Prior Functioning Home  Living Available Help at Discharge: Family;Available 24 hours/day Home Access: Stairs to enter Entrance Stairs-Number of Steps: 3 Entrance Stairs-Rails: None Home Layout: One level Bathroom Shower/Tub: Multimedia programmer: Handicapped height Bathroom Accessibility: Yes  Lives With: Spouse Prior Function Level of Independence: Independent with basic ADLs;Independent with transfers;Independent with homemaking with ambulation;Independent with gait  Able to Take Stairs?: Yes Driving: Yes Vocation: Retired Leisure: Hobbies-yes (Comment) Comments: walks a mile 2 days a week and goes to spin class 3 days a week, spending time with grandchildren Vision/Perception   No changes from baseline  Cognition Overall Cognitive Status: Impaired/Different from baseline Arousal/Alertness: Awake/alert Attention: Sustained Sustained Attention: Appears intact Memory: Impaired Memory Impairment: Decreased recall of new information;Decreased short term memory Decreased Short Term Memory: Verbal complex Awareness: Appears intact Problem Solving: Impaired Problem Solving Impairment: Verbal basic;Verbal complex;Functional complex Safety/Judgment: Appears intact Sensation Sensation Light Touch: Appears Intact Stereognosis: Not tested Hot/Cold: Appears Intact Proprioception: Appears Intact Coordination Gross Motor Movements are Fluid and Coordinated: No Fine Motor Movements are Fluid and Coordinated: Yes Coordination and Movement Description: small, slow movements anticipate due to fear of falling and dizziness Heel Shin Test: [facility] Motor  Motor Motor: Within Functional Limits  Mobility Bed Mobility Bed Mobility: Rolling Right;Rolling Left;Supine to Sit;Sit to Supine Rolling Right: 5: Supervision Rolling Left: 5: Supervision Supine to Sit: 5: Supervision Sit to Supine: 5: Supervision Transfers Transfers: Yes Sit to Stand: 4: Min assist;With upper extremity assist Stand to Sit: 4:  Min assist;With upper extremity assist Locomotion  Ambulation Ambulation: Yes Ambulation/Gait Assistance: 4: Min assist Ambulation Distance (Feet): 130 Feet Assistive device: 1 person hand held assist Ambulation/Gait Assistance Details: Verbal cues for technique;Verbal cues for gait pattern Gait Gait: Yes Gait Pattern: Impaired Gait Pattern: Step-through pattern;Decreased stride length;Decreased trunk rotation Gait velocity: 10 MWT = 0.37 m/s Stairs / Additional Locomotion Stairs: Yes Stairs Assistance: 4: Min guard Stair Management Technique: Two rails;Alternating pattern;Step to pattern;Forwards Number of Stairs: 12 Height of Stairs: 3 (8 3", 4 6" stairs) Ramp: Not tested (comment) Curb: Not tested (comment) Wheelchair Mobility Wheelchair Mobility: No (patient ambulatory)  Trunk/Postural Assessment  Cervical Assessment Cervical Assessment: Within Functional Limits Thoracic Assessment Thoracic Assessment: Within Functional Limits Lumbar Assessment Lumbar Assessment: Within Functional Limits Postural Control Postural Control: Deficits on evaluation Protective Responses: delayed  Balance Balance Balance Assessed: Yes Static Standing Balance Static Standing - Balance Support: No upper extremity supported;During functional activity Static Standing - Level of Assistance: 4: Min assist Dynamic Standing Balance Dynamic Standing - Balance Support: During functional activity;Left upper extremity supported;No upper extremity supported Dynamic Standing - Level of Assistance: 4: Min assist  Functional Gait Assessment (FGA) Requirements: A marked 6-m (20-ft) walkway that is marked with a 30.48-cm (12-in) width.  _1_ 1. GAIT LEVEL SURFACE Instructions: Walk at your normal speed from here to the next mark (6 m[20 ft]). Grading: [place] the highest category that applies. (3) Normal-Walks 6 m (20 ft) in less than 5.5 seconds, no assistive devices, good speed, no evidence for imbalance,  normal gait pattern, deviates no more than 15.24 cm (6 in) outside of the 30.48-cm (12-in) walkway width. (2) Mild impairment-Walks 6 m (20 ft) in less than 7 seconds but greater than 5.5 seconds, uses assistive device, slower speed, mild gait deviations, or deviates 15.24-25.4 cm (6-10 in) outside of the 30.48-cm (12-in) walkway width. (1) Moderate impairment-Walks 6 m (20 ft), slow speed, abnormal gait pattern, evidence for imbalance, or deviates 25.4-38.1 cm (10-15 in) outside of the 30.48-cm (12-in) walkway width. Requires more than 7 seconds to ambulate 6 m (20 ft). (0) Severe impairment-Cannot walk 6 m (20 ft) without assistance,severe gait deviations or imbalance, deviates greater than 38.1 cm (15 in) outside of the 30.48-cm (12-in) walkway width or reaches and touches the wall.  _2_ 2. CHANGE IN GAIT SPEED Instructions: Begin walking at  your normal pace (for 1.5 m [5 ft]). When I tell you "go," walk as fast as you can (for 1.5 m [5 ft]). When I tell you "slow," walk as slowly as you can (for 1.5 m [5 ft]). Grading: [place] the highest category that applies. (3) Normal-Able to smoothly change walking speed without loss of balance or gait deviation. Shows a significant difference in walking speeds between normal, fast, and slow speeds. Deviates no more than 15.24 cm (6 in) outside of the 30.48-cm (12-in) walkway width. (2) Mild impairment-Is able to change speed but demonstrates mild gait deviations, deviates 15.24-25.4 cm (6-10 in) outside of the 30.48-cm (12-in) walkway width, or no gait deviations but unable to achieve a significant change in velocity, or uses an assistive device. (1) Moderate impairment-Makes only minor adjustments to walking speed, or accomplishes a change in speed with significant gait deviations, deviates 25.4-38.1 cm (10-15 in) outside the 30.48-cm (12-in) walkway width, or changes speed but loses balance but is able to recover and continue walking. (0) Severe impairment-Cannot  change speeds, deviates greater than 38.1 cm (15 in) outside 30.48-cm (12-in) walkway width, or loses balance and has to reach for wall or be caught.  _1_ 3. GAIT WITH HORIZONTAL HEAD TURNS Instructions: Walk from here to the next mark 6 m (20 ft) away. Begin walking at your normal pace. Keep walking straight; after 3 steps, turn your head to the right and keep walking straight while looking to the right. After 3 more steps, turn your head to the left and keep walking straight while looking left. Continue alternating looking right and left every 3 steps until you have completed 2 repetitions in each direction. Grading: [place] the highest category that applies. (3) Normal-Performs head turns smoothly with no change in gait. Deviates no more than 15.24 cm (6 in) outside 30.48-cm (12-in) walkway width. (2) Mild impairment-Performs head turns smoothly with slight change in gait velocity (eg, minor disruption to smooth gait path), deviates 15.24-25.4 cm (6-10 in) outside 30.48-cm (12-in) walkway width, or uses an assistive device.  (1) Moderate impairment-Performs head turns with moderate change in gait velocity, slows down, deviates 25.4-38.1 cm (10-15 in) outside 30.48-cm (12-in) walkway width but recovers, can continue to walk. (0) Severe impairment-Performs task with severe disruption of gait (eg, staggers 38.1 cm [15 in] outside 30.48-cm (12-in) walkway width, loses balance, stops, or reaches for wall).  _1_ 4. GAIT WITH VERTICAL HEAD TURNS Instructions: Walk from here to the next mark (6 m [20 ft]). Begin walking at your normal pace. Keep walking straight; after 3 steps, tip your head up and keep walking straight while looking up. After 3 more steps, tip your head down, keep walking straight while looking down. Continue alternating looking up and down every 3 steps until you have completed 2 repetitions in each direction. Grading: [place] the highest category that applies. (3) Normal-Performs head turns  with no change in gait. Deviates no more than 15.24 cm (6 in) outside 30.48-cm (12-in) walkway width. (2) Mild impairment-Performs task with slight change in gait velocity (eg, minor disruption to smooth gait path), deviates 15.24-25.4 cm (6-10 in) outside 30.48-cm (12-in) walkway width or uses assistive device. (1) Moderate impairment-Performs task with moderate change in gait velocity, slows down, deviates 25.4-38.1 cm (10-15 in) outside 30.48-cm (12-in) walkway width but recovers, can continue to walk. (0) Severe impairment-Performs task with severe disruption of gait (eg, staggers 38.1 cm [15 in] outside 30.48-cm (12-in) walkway width, loses balance, stops, reaches for wall).  _2_ 5. GAIT AND PIVOT TURN Instructions: Begin with walking at your normal pace. When I tell you, "turn and stop," turn as quickly as you can to face the opposite direction and stop. Grading: [place] the highest category that applies. (3) Normal-Pivot turns safely within 3 seconds and stops quickly with no loss of balance. (2) Mild impairment-Pivot turns safely in _3 seconds and stops with no loss of balance, or pivot turns safely within 3 seconds and stops with mild imbalance, requires small steps to catch balance. (1) Moderate impairment-Turns slowly, requires verbal cueing, or requires several small steps to catch balance following turn and stop. (0) Severe impairment-Cannot turn safely, requires assistance to turn and stop.  _1_ 6. STEP OVER OBSTACLE  Instructions: Begin walking at your normal speed. When you come to the shoe box, step over it, not around it, and keep walking. Grading: [place] the highest category that applies. (3) Normal-Is able to step over 2 stacked shoe boxes taped together (22.86 cm [9 in] total height) without changing gait speed; no evidence of imbalance. (2) Mild impairment-Is able to step over one shoe box (11.43 cm [4.5 in] total height) without changing gait speed; no evidence of imbalance. (1)  Moderate impairment-Is able to step over one shoe box (11.43 cm [4.5 in] total height) but must slow down and adjust steps to clear box safely. May require verbal cueing. (0) Severe impairment-Cannot perform without assistance.  _1_ 7. GAIT WITH NARROW BASE OF SUPPORT Instructions: Walk on the floor with arms folded across the chest, feet aligned heel to toe in tandem for a distance of 3.6 m [12 ft]. The number of steps taken in a straight line are counted for a maximum of 10 steps. Grading: [place] the highest category that applies. (3) Normal-Is able to ambulate for 10 steps heel to toe with no staggering. (2) Mild impairment-Ambulates 7-9 steps. (1) Moderate impairment-Ambulates 4-7 steps. (0) Severe impairment-Ambulates less than 4 steps heel to toe or cannot perform without assistance.  _0_ 8. GAIT WITH EYES CLOSED Instructions: Walk at your normal speed from here to the next mark (6 m [20 ft]) with your eyes closed. Grading: [place] the highest category that applies. (3) Normal-Walks 6 m (20 ft), no assistive devices, good speed, no evidence of imbalance, normal gait pattern, deviates no more than 15.24 cm (6 in) outside 30.48-cm (12-in) walkway width. Ambulates 6 m (20 ft) in less than 7 seconds. (2) Mild impairment-Walks 6 m (20 ft), uses assistive device, slower speed, mild gait deviations, deviates 15.24-25.4 cm (6-10 in) outside 30.48-cm (12-in) walkway width. Ambulates 6 m (20 ft) in less than 9 seconds but greater than 7 seconds. (1) Moderate impairment-Walks 6 m (20 ft), slow speed, abnormal gait pattern, evidence for imbalance, deviates 25.4-38.1 cm (10-15 in) outside 30.48-cm (12-in) walkway width. Requires more than 9 seconds to ambulate 6 m (20 ft). (0) Severe impairment-Cannot walk 6 m (20 ft) without assistance, severe gait deviations or imbalance, deviates greater than 38.1 cm (15 in) outside 30.48-cm (12-in) walkway width or will not attempt task.  _2_ 9. AMBULATING  BACKWARDS Instructions: Walk backwards until I tell you to stop. Grading: [place] the highest category that applies. (3) Normal-Walks 6 m (20 ft), no assistive devices, good speed, no evidence for imbalance, normal gait pattern, deviates no more than 15.24 cm (6 in) outside 30.48-cm (12-in) walkway width. (2) Mild impairment-Walks 6 m (20 ft), uses assistive device, slower speed, mild gait deviations, deviates 15.24-25.4 cm (6-10 in) outside 30.48-cm (  12-in) walkway width. (1) Moderate impairment-Walks 6 m (20 ft), slow speed, abnormal gait pattern, evidence for imbalance, deviates 25.4-38.1 cm (10-15 in) outside 30.48-cm (12-in) walkway width. (0) Severe impairment-Cannot walk 6 m (20 ft) without assistance, severe gait deviations or imbalance, deviates greater than 38.1 cm (15 in) outside 30.48-cm (12-in) walkway width or will not attempt task.  _2_ 10. STEPS Instructions: Walk up these stairs as you would at home (ie, using the rail if necessary). At the top turn around and walk down. Grading: [place] the highest category that applies. (3) Normal-Alternating feet, no rail. (2) Mild impairment-Alternating feet, must use rail. (1) Moderate impairment-Two feet to a stair; must use rail. (0) Severe impairment-Cannot do safely.  TOTAL SCORE: ___13___ /30 (MAXIMUM SCORE=30)  Scores of ? 22/30 on the FGA were found to be effective in predicting falls, Sensitivity 85%, Specificity 86% Scores of ? 20/30 on the FGA were optimal to predict older adults residing in community dwellings who would sustain unexplained falls in the next 6 months, Sensitivity 100%, Specificity 76% ([place], 2010; aged 13 to 34, Older Adults) [place]: 4.2 points for CVA Augustin Coupe et al, 2010) [facility]: 8 points for Balance and Vestibular Disorders Marjorie Smolder and Augustin Coupe, 2014)  Extremity Assessment  RUE Assessment RUE Assessment: Within Functional Limits LUE Assessment LUE Assessment: Within Functional Limits RLE Assessment RLE  Assessment: Within Functional Limits (grossly 5/5 except 4/5 hip flexion) LLE Assessment LLE Assessment: Within Functional Limits (grossly 5/5 except 4+/5 hip flexion)   See Function Navigator for Current Functional Status.   Refer to Care Plan for Long Term Goals  Recommendations for other services: Other: Vestibular evaluation  Discharge Criteria: Patient will be discharged from PT if patient refuses treatment 3 consecutive times without medical reason, if treatment goals not met, if there is a change in medical status, if patient makes no progress towards goals or if patient is discharged from hospital.  The above assessment, treatment plan, treatment alternatives and goals were discussed and mutually agreed upon: by patient  Laretta Alstrom 01/29/2015, 9:17 AM

## 2015-01-29 NOTE — Progress Notes (Signed)
[facility] PHYSICAL MEDICINE & REHABILITATION     PROGRESS NOTE    Subjective/Complaints: Had headaches last night, this morning, 7/10 intensity. Ready to get started in therapies today. Reasonable appetite.   ROS: Pt denies fever, rash/itching, nausea, vomiting, abdominal pain, diarrhea, chest pain, shortness of breath, palpitations, dysuria, dizziness, neck or back pain, bleeding, anxiety, or depression    Objective: Vital Signs: Blood pressure 133/53, pulse 62, temperature 98.6 F (37 C), temperature source Oral, resp. rate 17, height 5' 6.5" (1.689 m), SpO2 94 %. No results found.  Recent Labs  01/29/15 0503  WBC 9.4  HGB 13.4  HCT 40.6  PLT 386    Recent Labs  01/29/15 0503  NA 137  K 4.0  CL 100*  GLUCOSE 108*  BUN 15  CREATININE 0.88  CALCIUM 9.1   CBG (last 3)  No results for input(s): GLUCAP in the last 72 hours.  Wt Readings from Last 3 Encounters:  01/22/15 51.71 kg (114 lb)  09/07/14 69.4 kg (153 lb)  08/30/14 68.675 kg (151 lb 6.4 oz)    Physical Exam:  Constitutional: She is oriented to person, place, and time. She appears well-developed and well-nourished. No distress.  HENT:  Head: Normocephalic.  Tender over crown,frontal areas.  Eyes: Conjunctivae and EOM are normal. Pupils are equal, round, and reactive to light.  Neck: Normal range of motion. Neck supple.  Cardiovascular: Normal rate and regular rhythm.  Respiratory: Effort normal and breath sounds normal. No respiratory distress. She has no wheezes.  GI: Soft. Bowel sounds are normal. She exhibits no distension. There is no tenderness.  Musculoskeletal: She exhibits no edema or tenderness.  Neuro:  Strength b/l UE 4+/5 proximal to distal  B/l LE: 4/5 proximally, 4+/5 distally    She is alert and oriented to person, place, and time. Negative PD.   Voice improved. She is able to follow one and two step commands without difficulty.  Skin: Skin is warm and dry. No rash noted. She is  not diaphoretic. No erythema.  Psychiatric: Her behavior is normal. Thought content normal.  Affect more dynamic   Assessment/Plan: 1. Functional deficits secondary to TBI which require 3+ hours per day of interdisciplinary therapy in a comprehensive inpatient rehab setting. Physiatrist is providing close team supervision and 24 hour management of active medical problems listed below. Physiatrist and rehab team continue to assess barriers to discharge/monitor patient progress toward functional and medical goals.  Function:  Bathing Bathing position      Bathing parts      Bathing assist        Upper Body Dressing/Undressing Upper body dressing                    Upper body assist        Lower Body Dressing/Undressing Lower body dressing                                  Lower body assist        Toileting Toileting   Toileting steps completed by patient: Adjust clothing prior to toileting, Performs perineal hygiene, Adjust clothing after toileting   Toileting Assistive Devices: Grab bar or rail  Toileting assist Assist level: Touching or steadying assistance (Pt.75%), More than reasonable time   Transfers Chair/bed transfer             Locomotion Ambulation  Wheelchair          Cognition Comprehension Comprehension assist level: Follows basic conversation/direction with no assist  Expression Expression assist level: Expresses basic needs/ideas: With no assist  Social Interaction Social Interaction assist level: Interacts appropriately with others - No medications needed.  Problem Solving Problem solving assist level: Solves basic problems with no assist  Memory Memory assist level: More than reasonable amount of time   Medical Problem List and Plan: 1. Gait, balance, cognitive deficits secondary to traumatic right frontal hemorrhagic contusion, occipital skull fracture and SAH -begin therapies today 2. DVT  Prophylaxis/Anticoagulation: Mechanical: Sequential compression devices, below knee Bilateral lower extremities and increase in mobility/activity level.  3. Pain Management: persistent h/a. Prn vicodin  -will add scheduled hs topamax 25mg  4. Mood: continue Paxil. LCSW to follow for evaluation and support.  5. Neuropsych: This patient is capable of making decisions on her own behalf.  -hs seroquel for sleep/confusion---wean as possible 6. Skin/Wound Care: Routine pressure relief measures. Able to reposition independently 7. Fluids/Electrolytes/Nutrition: good appetite this am  -I personally reviewed the patient's labs today. Labs normal. 8. LBBB/Supine HTN: On metoprolol bid. Mestonin to help with orthostatic changes as florinef ineffective.   -track bp's with increased activities on rehab 9. Insomnia: Used Xanax for sleep at home. Seroquel is effective.  10. Low protein stores: Add supplement LOS (Days) 1 A FACE TO FACE EVALUATION WAS PERFORMED  Darly Fails T 01/29/2015 8:34 AM

## 2015-01-29 NOTE — Evaluation (Signed)
Occupational Therapy Assessment and Plan  Patient Details  Name: Shenicka NAYELIZ HIPP MRN: [id number] Date of Birth: [birth year]  OT Diagnosis: cognitive deficits and muscle weakness (generalized) Rehab Potential: Rehab Potential (ACUTE ONLY): Excellent ELOS: 7days   Today's Date: 01/29/2015 OT Individual Time: 1100-1200 OT Individual Time Calculation (min): 60 min     Problem List:  Patient Active Problem List   Diagnosis Date Noted  . TBI (traumatic brain injury) ([place]) 01/28/2015  . Insomnia   . Hypoproteinemia ([place])   . Essential hypertension   . Acute post-traumatic headache, not intractable   . Skull fracture ([place])   . Vertigo due to brain injury ([place])   . Subarachnoid bleed ([place]) 01/22/2015  . SAH (subarachnoid hemorrhage) ([place]) 01/21/2015  . Annual physical exam 09/07/2014  . Orthostatic hypotension 08/30/2014  . Acromioclavicular arthrosis 03/21/2014  . Allergic rhinitis 02/27/2014  . SI (sacroiliac) joint dysfunction 02/02/2014  . Gastrocnemius strain, left 12/11/2013  . Toenail fungus 11/15/2013  . Back pain 08/22/2013  . LBBB (left bundle branch block) 05/13/2013  . Shingles outbreak 12/15/2012  . Borderline diabetes   . Essential hypertension, benign 07/14/2012  . Syncope and collapse 02/15/2011  . Paresthesia of foot 07/07/2010  . INSOMNIA, CHRONIC, MILD 01/12/2008  . OVERWEIGHT 10/12/2007  . HLD (hyperlipidemia) 02/04/2007  . OSTEOPENIA 02/04/2007  . ANXIETY 10/12/2006  . OSTEOARTHRITIS 10/12/2006    Past Medical History:  Past Medical History  Diagnosis Date  . Insomnia   . Adenomatous polyp of colon   . Overweight(278.02)   . Symptomatic menopausal or female climacteric states   . Hyperlipidemia   . Osteoarthrosis, unspecified whether generalized or localized, unspecified site   . Anxiety state, unspecified   . Hypertension   . Syncope and collapse     11/12: Holter 12/12 with rare PACS, HR range 64-140, average 82, no significant arrhythmias. Echo  (1/13): EF 50-55%, mild LVH, septal-lateral dyssynchrony c/w LBBB. 3-week event monitor (1/13): No significant arrhythmia.   Marland Kitchen LBBB (left bundle branch block)     LHC in 2002 showed normal coronaries.    Past Surgical History:  Past Surgical History  Procedure Laterality Date  . Tubal ligation      Assessment & Plan Clinical Impression: Kaho NYLAH BUTKUS is a 69 y.o. right handed female with history of HTN, anxiety, LBBB, syncope and presyncope who was admitted on 01/21/2015 after striking her head due to fall with amnesia of events and complaints of HA.  Patient has history of orthostatic hypotension that had improved on mestinon.  She was evaluated in ED and CT of the head revealed inferior right frontal hemorrhagic contusion, non-depressed occipital skull fracture, and moderate SAH around R-sylvian fissure. CTA head negative for aneurysm, occlusion or stenosis. Dr. Saintclair Halsted evaluated patient and recommended conservative management with serial CCT.  Cardiac echo done revealing EF 50-55% with anteroseptal hypokinesis, calcified MV and grade 1 diastolic dysfunction--no change since last study. Carotid dopplers without significant ICA stenosis.  Last CCT with increase in edema and size of right frontal hematoma and was treated with short course of steroids. She has had confusion that's resolving with addition of Seroquel.  She continues to have unsteady gait with nausea and vertigo due to BPPV. CIR recommended by MD and rehab team.    Patient transferred to CIR on 01/28/2015 .    Patient currently requires min with basic self-care skills secondary to decreased cardiorespiratoy endurance, decreased problem solving, decreased memory and delayed processing and decreased standing balance and decreased balance  strategies.  Prior to hospitalization, patient was very active and independent, driving.  Patient will benefit from skilled intervention to increase independence with basic self-care skills and increase  level of independence with iADL prior to discharge home with care partner.  Anticipate patient will require 24 hour supervision and follow up home health.  OT - End of Session Endurance Deficit: Yes Endurance Deficit Description: required seated rest breaks with mobility OT Assessment Rehab Potential (ACUTE ONLY): Excellent OT Patient demonstrates impairments in the following area(s): Balance;Cognition;Endurance OT Basic ADL's Functional Problem(s): Bathing;Dressing;Toileting OT Advanced ADL's Functional Problem(s): Simple Meal Preparation;Light Housekeeping OT Transfers Functional Problem(s): Toilet;Tub/Shower OT Additional Impairment(s): None OT Plan OT Intensity: Minimum of 1-2 x/day, 45 to 90 minutes OT Frequency: 5 out of 7 days OT Duration/Estimated Length of Stay: 7days OT Treatment/Interventions: Balance/vestibular training;Cognitive remediation/compensation;Discharge planning;Community reintegration;DME/adaptive equipment instruction;Functional mobility training;Neuromuscular re-education;Patient/family education;Psychosocial support;Self Care/advanced ADL retraining;Therapeutic Activities;Therapeutic Exercise;UE/LE Strength taining/ROM OT Self Feeding Anticipated Outcome(s): Independent OT Basic Self-Care Anticipated Outcome(s): supervision with standing activities OT Toileting Anticipated Outcome(s): supervision OT Bathroom Transfers Anticipated Outcome(s): supervision OT Recommendation Patient destination: Home Follow Up Recommendations: Home health OT Equipment Recommended: Tub/shower seat   Skilled Therapeutic Intervention Pt seen for initial evaluation, ADL retraining, and education on role of OT. Reviewed pt's goals for therapy to be more independent with walking.  Pt's spouse present.  Pt stated that she no longer has diplopia at this time, but she was feeling very fatigued. Pt was not feeling well enough for a shower, but was able to fully engage in all other self care  tasks. Pt required min cues at time for sequencing of steps and min a to support her balance in standing. She needs extra processing time to register commands/ directions.  At end of session, pt opted to rest in recliner to prepare for lunch. All needs met.  OT Evaluation Precautions/Restrictions  Precautions Precautions: Fall Restrictions Weight Bearing Restrictions: No    Vital Signs Therapy Vitals Temp: 97.5 F (36.4 C) Temp Source: Oral Pulse Rate: 61 Resp: 17 BP: 120/62 mmHg Patient Position (if appropriate): Lying Oxygen Therapy SpO2: 98 % O2 Device: Not Delivered Pain Pain Assessment Pain Assessment: No/denies pain Home Living/Prior Functioning Home Living Available Help at Discharge: Family, Available 24 hours/day Type of Home: House Home Access: Stairs to enter Technical brewer of Steps: 3 Entrance Stairs-Rails: None Home Layout: One level Bathroom Shower/Tub: Multimedia programmer: Handicapped height Bathroom Accessibility: Yes  Lives With: Spouse Prior Function Level of Independence: Independent with basic ADLs, Independent with transfers, Independent with homemaking with ambulation, Independent with gait  Able to Take Stairs?: Yes Driving: Yes Vocation: Retired Leisure: Hobbies-yes (Comment) Comments: walks a mile 2 days a week and goes to spin class 3 days a week, spending time with grandchildren ADL ADL ADL Comments: Refer to functional navigator Vision/Perception  Vision- History Baseline Vision/History: Wears glasses Wears Glasses: At all times Patient Visual Report: Diplopia (no c/o diplopia at time of eval) Vision- Assessment Vision Assessment?: Yes Eye Alignment: Within Functional Limits Ocular Range of Motion: Within Functional Limits Alignment/Gaze Preference: Within Defined Limits Tracking/Visual Pursuits: Able to track stimulus in all quads without difficulty Visual Fields: No apparent deficits Diplopia Assessment:  (not  present at time of eval)  Cognition Overall Cognitive Status: Impaired/Different from baseline Arousal/Alertness: Awake/alert Orientation Level: Person;Place;Situation Person: Oriented Place: Oriented Situation: Oriented Year: 2016 Month: November Day of Week: Incorrect (Wednesday) Memory: Impaired Memory Impairment: Decreased recall of new information;Decreased short term memory Decreased Short  Term Memory: Verbal complex Immediate Memory Recall: Sock;Blue;Bed Memory Recall: Sock;Blue;Bed Memory Recall Sock: Without Cue Memory Recall Blue: Without Cue Memory Recall Bed: Without Cue Attention: Sustained Sustained Attention: Appears intact Awareness: Appears intact Problem Solving: Impaired Problem Solving Impairment: Verbal basic;Verbal complex;Functional complex Safety/Judgment: Appears intact Sensation Sensation Light Touch: Appears Intact Stereognosis: Appears Intact Hot/Cold: Appears Intact Proprioception: Appears Intact Coordination Gross Motor Movements are Fluid and Coordinated: No Fine Motor Movements are Fluid and Coordinated: Yes Motor  Motor Motor: Within Functional Limits Mobility    min A with ambulation/ transfers Trunk/Postural Assessment  Cervical Assessment Cervical Assessment: Within Functional Limits Thoracic Assessment Thoracic Assessment: Within Functional Limits Lumbar Assessment Lumbar Assessment: Within Functional Limits Postural Control Postural Control: Deficits on evaluation Protective Responses: delayed  Balance Dynamic Sitting Balance Sitting balance - Comments: vertigo Static Standing Balance Static Standing - Level of Assistance: 4: Min assist Dynamic Standing Balance Dynamic Standing - Level of Assistance: 4: Min assist Extremity/Trunk Assessment RUE Assessment RUE Assessment: Within Functional Limits LUE Assessment LUE Assessment: Within Functional Limits   See Function Navigator for Current Functional Status.   Refer  to Care Plan for Long Term Goals  Recommendations for other services: None  Discharge Criteria: Patient will be discharged from OT if patient refuses treatment 3 consecutive times without medical reason, if treatment goals not met, if there is a change in medical status, if patient makes no progress towards goals or if patient is discharged from hospital.  The above assessment, treatment plan, treatment alternatives and goals were discussed and mutually agreed upon: by patient and by family  [place] 01/29/2015, 2:52 PM

## 2015-01-30 ENCOUNTER — Inpatient Hospital Stay (HOSPITAL_COMMUNITY): Payer: Medicare HMO | Admitting: *Deleted

## 2015-01-30 ENCOUNTER — Inpatient Hospital Stay (HOSPITAL_COMMUNITY): Payer: Medicare HMO | Admitting: Speech Pathology

## 2015-01-30 ENCOUNTER — Inpatient Hospital Stay (HOSPITAL_COMMUNITY): Payer: Medicare HMO | Admitting: Physical Therapy

## 2015-01-30 ENCOUNTER — Inpatient Hospital Stay (HOSPITAL_COMMUNITY): Payer: Medicare HMO | Admitting: Occupational Therapy

## 2015-01-30 ENCOUNTER — Inpatient Hospital Stay (HOSPITAL_COMMUNITY): Payer: Medicare HMO

## 2015-01-30 MED ORDER — ALPRAZOLAM 0.25 MG PO TABS: 0.2500 mg | ORAL_TABLET | Freq: Every evening | ORAL | Status: DC | PRN

## 2015-01-30 MED ORDER — TOPIRAMATE 25 MG PO TABS
12.5000 mg | ORAL_TABLET | Freq: Every day | ORAL | Status: DC
  Administered 2015-01-30 – 2015-02-04 (×6): 12.5 mg via ORAL
  Filled 2015-01-30 (×7): qty 0.5

## 2015-01-30 MED ORDER — QUETIAPINE FUMARATE 25 MG PO TABS
12.5000 mg | ORAL_TABLET | Freq: Every day | ORAL | Status: DC
  Administered 2015-01-30 – 2015-02-04 (×6): 12.5 mg via ORAL
  Filled 2015-01-30 (×6): qty 1

## 2015-01-30 NOTE — Progress Notes (Signed)
Physical Therapy Note  Patient Details  Name: Mandy Durham MRN: [id number] Date of Birth: September 20, 1945 Today's Date: 01/30/2015  Patient declined 60 min skilled physical therapy due to fatigue and low BP. Patient left semi reclined in bed with husband at bedside.     Carney Living A 01/30/2015, 4:19 PM

## 2015-01-30 NOTE — Progress Notes (Signed)
Social Work Patient ID: Mandy Durham, female   DOB: 1945-11-07, 69 y.o.   MRN: [id number]   Have reviewed team conference with pt and husband today.  Both aware and agreeable with targeted d/c date of 11/22 with supervision goals.  Pleased with gains and aware plan for vestibular evaluation this afternoon.  Continue to follow.  Travonna Swindle, LCSW

## 2015-01-30 NOTE — Patient Care Conference (Signed)
Inpatient RehabilitationTeam Conference and Plan of Care Update Date: 01/29/2015   Time: 3:00 PM    Patient Name: Keyly [place] Record Number: [id number]  Date of Birth: Aug 31, 1945 Sex: Female         Room/Bed: 4M01C/4M01C-01 Payor Info: Payor: [employer] MEDICARE / Plan: [employer] MEDICARE HMO/PPO / Product Type: *No Product type* /    Admitting Diagnosis: tramatic SDH   Admit Date/Time:  01/28/2015  4:54 PM Admission Comments: No comment available   Primary Diagnosis:  <principal problem not specified> Principal Problem: <principal problem not specified>  Patient Active Problem List   Diagnosis Date Noted  . TBI (traumatic brain injury) ([place]) 01/28/2015  . Insomnia   . Hypoproteinemia ([place])   . Essential hypertension   . Acute post-traumatic headache, not intractable   . Skull fracture ([place])   . Vertigo due to brain injury ([place])   . Subarachnoid bleed ([place]) 01/22/2015  . SAH (subarachnoid hemorrhage) ([place]) 01/21/2015  . Annual physical exam 09/07/2014  . Orthostatic hypotension 08/30/2014  . Acromioclavicular arthrosis 03/21/2014  . Allergic rhinitis 02/27/2014  . SI (sacroiliac) joint dysfunction 02/02/2014  . Gastrocnemius strain, left 12/11/2013  . Toenail fungus 11/15/2013  . Back pain 08/22/2013  . LBBB (left bundle branch block) 05/13/2013  . Shingles outbreak 12/15/2012  . Borderline diabetes   . Essential hypertension, benign 07/14/2012  . Syncope and collapse 02/15/2011  . Paresthesia of foot 07/07/2010  . INSOMNIA, CHRONIC, MILD 01/12/2008  . OVERWEIGHT 10/12/2007  . HLD (hyperlipidemia) 02/04/2007  . OSTEOPENIA 02/04/2007  . ANXIETY 10/12/2006  . OSTEOARTHRITIS 10/12/2006    Expected Discharge Date: Expected Discharge Date: 02/05/15  Team Members Present: Physician leading conference: Dr. Alger Simons Social Worker Present: Lennart Pall, LCSW Nurse Present: Heather Roberts, RN PT Present: Dwyane Dee, PT;Rebecca Varner, Dustin Folks,  PT OT Present: Willeen Cass, OT;Roanna Epley, COTA SLP Present: Weston Anna, SLP PPS Coordinator present : Daiva Nakayama, RN, CRRN     Current Status/Progress Goal Weekly Team Focus  Medical   right sided frontal hemorrhage. headaches, gait deficits, vestibular deficits  improve balance, safety, decrease headaches  pain control, bp mgt with increased activity   Bowel/Bladder   Continent to bowel and bladder.LBM 11/13  To continue continent to B & B with min. assisst.  To monitor bladder and bowel function Q shift.   Swallow/Nutrition/ Hydration             ADL's   min A with balance during self care, transfers, ambulation; min cues for sequencing/ processing  supervision with BADLs, home management, and light meal prep  ADL retraining, functional mobility, balance, cognitive skills, pt/family education   Mobility   min A overall up to 130 ft without AD  supervision gait, mod I transfers  functional mobility training, dynamic standing balance, vestibular rehab, cognitive remediation, activity tolerance, pt/family education   Communication             Safety/Cognition/ Behavioral Observations  Min A  Supervision with complex tasks  complex problem solving and recall    Pain             Skin   Complain of headache.Tylenol 500 mg. schedule before breakfast and lunch.  To keep pain level less than 3,on 1 to 10 scale.  To monitor pain levels Q 2-3 hrs.    Rehab Goals Patient on target to meet rehab goals: Yes *See Care Plan and progress notes for long and short-term goals.  Barriers to Discharge: balance, safety awareness    Possible Resolutions to Barriers:  NMR, cognitive remediation, family and patient ed    Discharge Planning/Teaching Needs:  Plan home with spouse who can provide 24/7 assistance.  Ongoing education.   Team Discussion:  New eval today.  Some drop in BP with therapies.  Need to monitor cognition as she did moorly on MOCA by ST.  Vest eval planned for  tomorrow.  Close supervision/ min guard currently with supervision goals.  Revisions to Treatment Plan:  None   Continued Need for Acute Rehabilitation Level of Care: The patient requires daily medical management by a physician with specialized training in physical medicine and rehabilitation for the following conditions: Daily direction of a multidisciplinary physical rehabilitation program to ensure safe treatment while eliciting the highest outcome that is of practical value to the patient.: Yes Daily medical management of patient stability for increased activity during participation in an intensive rehabilitation regime.: Yes Daily analysis of laboratory values and/or radiology reports with any subsequent need for medication adjustment of medical intervention for : Neurological problems;Post surgical problems;Other  Bralynn Donado 01/30/2015, 10:28 AM

## 2015-01-30 NOTE — Progress Notes (Signed)
Physical Therapy Session Note  Patient Details  Name: Mandy Durham MRN: [id number] Date of Birth: Mar 10, 1946  Today's Date: 01/30/2015 PT Individual Time: 1410-1440 PT Individual Time Calculation (min): 30 min   Short Term Goals: Week 1:  PT Short Term Goal 1 (Week 1): = LTGs due to anticipated LOS  Skilled Therapeutic Interventions/Progress Updates:   Pt received in bed asleep; discussed goal of therapy session with pt and husband-vestibular eval-husband stating that pt experienced a drop in BP earlier and is very fatigued today but pt willing to try.  Assessed pt BP on LUE with dinamap with extremely low diastolic reading; reassessed in LUE with manual cuff.  Asked RN to check manual reading as well; assessed in LUE and RUE.  Discussed low BP and fatigue with PA who ordered THT and recommended that pt rest and vestibular eval be deferred to a day when pt more alert and less fatigued.  Returned to room and assisted pt with donning THT.  Pt left in bed asleep with all items within reach and husband present.  Primary PT alerted; vestibular eval deferred until Friday.  Therapy Documentation Precautions:  Precautions Precautions: Fall Restrictions Weight Bearing Restrictions: No General: PT Amount of Missed Time (min): 30 Minutes PT Missed Treatment Reason: Patient fatigue;Other (Comment) (low BP) Vital Signs: Therapy Vitals Pulse Rate: (!) 58 BP: (!) 116/56 mmHg (RUE manual BP) Patient Position (if appropriate): Lying Pain: Pain Assessment Pain Assessment: No/denies pain Pain Score: 2   See Function Navigator for Current Functional Status.   Therapy/Group: Individual Therapy  Raylene Everts [place] 01/30/2015, 4:20 PM

## 2015-01-30 NOTE — Progress Notes (Signed)
Speech Language Pathology Daily Session Note  Patient Details  Name: Mandy Durham MRN: [id number] Date of Birth: [birth year]  Today's Date: 01/30/2015 SLP Individual Time: 0900-1000 SLP Individual Time Calculation (min): 60 min  Short Term Goals: Week 1: SLP Short Term Goal 1 (Week 1): Patient will demonstrate problem solving with complex and familiar tasks with supervision verbal cues.   SLP Short Term Goal 2 (Week 1): Patient will recall new, daily information with supervision verbal cues.  SLP Short Term Goal 3 (Week 1): Patient will organize and plan throughout functional tasks with supervision verbal cues.   Skilled Therapeutic Interventions: Skilled treatment session focused on cognitive goals. Upon arrival, patient was awake while sitting supine in bed but was agreeable to participate in treatment session.  Patient transferred to the wheelchair with Min A verbal cues and extra time due to intermittent dizziness. SLP facilitated session by providing Mod A verbal and question cues for functional problem solving and organization with a mildly complex task of organizing a 4 time per day pill box. SLP also provided Max A multimodal cues for problem solving with a basic money management task. Patient appeared to demonstrate difficulty with verbal expression characterized by word-finding difficulties with intermittent verbal perseveration and difficulty following basic commands. RN and PA made aware. Patient transferred back to bed at end of session with all needs within reach. Continue with current plan of care.    Function:  Cognition Comprehension Comprehension assist level: Follows basic conversation/direction with no assist  Expression   Expression assist level: Expresses basic needs/ideas: With no assist  Social Interaction Social Interaction assist level: Interacts appropriately 90% of the time - Needs monitoring or encouragement for participation or interaction.  Problem Solving  Problem solving assist level: Solves basic 50 - 74% of the time/requires cueing 25 - 49% of the time  Memory Memory assist level: Recognizes or recalls 75 - 89% of the time/requires cueing 10 - 24% of the time    Pain Pain Assessment Pain Assessment: No/denies pain   Therapy/Group: Individual Therapy  Mandy Durham 01/30/2015, 4:18 PM

## 2015-01-30 NOTE — Progress Notes (Signed)
ST reports slurred speech this am and then again after lunch. Patient reporting fatigue as she didn't sleep well. Had bad dreams last night.  Was started on Topamax last night and got oxycodone in addition to Seroquel to help with pain/sleep.  Was seen resting between therapy sessions--Speech with mild delay but able to answer basic questions and husband felt that speech was getting better. Will monitor. Decrease Topamax to 12.5 mg and d/c oxycodone.  Will resume low dose Xanax prn at bedtime.

## 2015-01-30 NOTE — Progress Notes (Signed)
[facility] PHYSICAL MEDICINE & REHABILITATION     PROGRESS NOTE    Subjective/Complaints: Headaches much better. Slept better. Awake and alert this morning.   ROS: Pt denies fever, rash/itching, nausea, vomiting, abdominal pain, diarrhea, chest pain, shortness of breath, palpitations, dysuria, dizziness, neck or back pain, bleeding, anxiety, or depression    Objective: Vital Signs: Blood pressure 123/62, pulse 60, temperature 97.6 F (36.4 C), temperature source Oral, resp. rate 16, height 5' 6.5" (1.689 m), weight 69.99 kg (154 lb 4.8 oz), SpO2 95 %. No results found.  Recent Labs  01/29/15 0503  WBC 9.4  HGB 13.4  HCT 40.6  PLT 386    Recent Labs  01/29/15 0503  NA 137  K 4.0  CL 100*  GLUCOSE 108*  BUN 15  CREATININE 0.88  CALCIUM 9.1   CBG (last 3)  No results for input(s): GLUCAP in the last 72 hours.  Wt Readings from Last 3 Encounters:  01/30/15 69.99 kg (154 lb 4.8 oz)  01/22/15 51.71 kg (114 lb)  09/07/14 69.4 kg (153 lb)    Physical Exam:  Constitutional: She is oriented to person, place, and time. She appears well-developed and well-nourished. No distress.  HENT:  Head: Normocephalic.   Eyes: Conjunctivae and EOM are normal. Pupils are equal, round, and reactive to light.  Neck: Normal range of motion. Neck supple.  Cardiovascular: Normal rate and regular rhythm.  Respiratory: Effort normal and breath sounds normal. No respiratory distress. She has no wheezes.  GI: Soft. Bowel sounds are normal. She exhibits no distension. There is no tenderness.  Musculoskeletal: She exhibits no edema or tenderness.  Neuro:  Strength b/l UE 4+/5 proximal to distal  B/l LE: 4/5 proximally, 4+/5 distally    She is alert and oriented to person, place, and time. Negative PD.   Voice improved. Speech stuttering. She is able to follow one and two step commands without difficulty.  Skin: Skin is warm and dry. No rash noted. She is not diaphoretic. No erythema.   Psychiatric: Her behavior is normal. Thought content normal.  Affect more dynamic   Assessment/Plan: 1. Functional deficits secondary to TBI which require 3+ hours per day of interdisciplinary therapy in a comprehensive inpatient rehab setting. Physiatrist is providing close team supervision and 24 hour management of active medical problems listed below. Physiatrist and rehab team continue to assess barriers to discharge/monitor patient progress toward functional and medical goals.  Function:  Bathing Bathing position   Position: Wheelchair/chair at sink  Bathing parts Body parts bathed by patient: Right arm, Left arm, Chest, Abdomen, Front perineal area, Buttocks, Right upper leg, Left upper leg, Right lower leg, Left lower leg, Back    Bathing assist Assist Level: Touching or steadying assistance(Pt > 75%)      Upper Body Dressing/Undressing Upper body dressing   What is the patient wearing?: Pull over shirt/dress     Pull over shirt/dress - Perfomed by patient: Thread/unthread right sleeve, Thread/unthread left sleeve, Put head through opening, Pull shirt over trunk          Upper body assist        Lower Body Dressing/Undressing Lower body dressing   What is the patient wearing?: Pants, Non-skid slipper socks     Pants- Performed by patient: Thread/unthread right pants leg, Thread/unthread left pants leg, Pull pants up/down   Non-skid slipper socks- Performed by patient: Don/doff right sock, Don/doff left sock  Lower body assist Assist for lower body dressing: Touching or steadying assistance (Pt > 75%)      Toileting Toileting   Toileting steps completed by patient: Adjust clothing prior to toileting, Performs perineal hygiene, Adjust clothing after toileting   Toileting Assistive Devices: Grab bar or rail  Toileting assist Assist level: Touching or steadying assistance (Pt.75%)   Transfers Chair/bed transfer   Chair/bed transfer  method: Ambulatory Chair/bed transfer assist level: Touching or steadying assistance (Pt > 75%)       Locomotion Ambulation     Max distance: 130 ft Assist level: Touching or steadying assistance (Pt > 75%)   Wheelchair          Cognition Comprehension Comprehension assist level: Follows basic conversation/direction with extra time/assistive device  Expression Expression assist level: Expresses basic needs/ideas: With extra time/assistive device  Social Interaction Social Interaction assist level: Interacts appropriately with others - No medications needed.  Problem Solving Problem solving assist level: Solves basic 75 - 89% of the time/requires cueing 10 - 24% of the time  Memory Memory assist level: Recognizes or recalls 75 - 89% of the time/requires cueing 10 - 24% of the time   Medical Problem List and Plan: 1. Gait, balance, cognitive deficits secondary to traumatic right frontal hemorrhagic contusion, occipital skull fracture and SAH -continue CIR therapies 2. DVT Prophylaxis/Anticoagulation: Mechanical: Sequential compression devices, below knee Bilateral lower extremities and increase mobility/activity level.  3. Pain Management: persistent h/a. Prn vicodin  -continue scheduled hs topamax 25mg  4. Mood: continue Paxil. LCSW to follow for evaluation and support.  5. Neuropsych: This patient is capable of making decisions on her own behalf.  -hs seroquel for sleep/confusion---effective 6. Skin/Wound Care: Routine pressure relief measures. Able to reposition independently 7. Fluids/Electrolytes/Nutrition: good appetite this am  -I personally reviewed the patient's labs today. Labs normal. 8. LBBB/Supine HTN: On metoprolol bid. Mestonin to help with orthostatic changes as florinef ineffective.   -track bp's with increased activities on rehab  -bp dropped yesterday with some symptoms. Continue acclimation/meds 9. Insomnia: (Used Xanax for sleep at home).  Seroquel is effective.  10. Low protein stores: Added supplement LOS (Days) 2 A FACE TO FACE EVALUATION WAS PERFORMED  Lorraine Terriquez T 01/30/2015 9:20 AM

## 2015-01-30 NOTE — Progress Notes (Signed)
Recreational Therapy Assessment and Plan  Patient Details  Name: Mandy Durham MRN: [id number] Date of Birth: [birth year] Today's Date: 01/30/2015  Rehab Potential: Good ELOS: 7 days   Assessment Clinical Impression:  Problem List:  Patient Active Problem List   Diagnosis Date Noted  . TBI (traumatic brain injury) ([place]) 01/28/2015  . Insomnia   . Hypoproteinemia ([place])   . Essential hypertension   . Acute post-traumatic headache, not intractable   . Skull fracture ([place])   . Vertigo due to brain injury ([place])   . Subarachnoid bleed ([place]) 01/22/2015  . SAH (subarachnoid hemorrhage) ([place]) 01/21/2015  . Annual physical exam 09/07/2014  . Orthostatic hypotension 08/30/2014  . Acromioclavicular arthrosis 03/21/2014  . Allergic rhinitis 02/27/2014  . SI (sacroiliac) joint dysfunction 02/02/2014  . Gastrocnemius strain, left 12/11/2013  . Toenail fungus 11/15/2013  . Back pain 08/22/2013  . LBBB (left bundle branch block) 05/13/2013  . Shingles outbreak 12/15/2012  . Borderline diabetes   . Essential hypertension, benign 07/14/2012  . Syncope and collapse 02/15/2011  . Paresthesia of foot 07/07/2010  . INSOMNIA, CHRONIC, MILD 01/12/2008  . OVERWEIGHT 10/12/2007  . HLD (hyperlipidemia) 02/04/2007  . OSTEOPENIA 02/04/2007  . ANXIETY 10/12/2006  . OSTEOARTHRITIS 10/12/2006    Past Medical History:  Past Medical History  Diagnosis Date  . Insomnia   . Adenomatous polyp of colon   . Overweight(278.02)   . Symptomatic menopausal or female climacteric states   . Hyperlipidemia   . Osteoarthrosis, unspecified whether generalized or localized, unspecified site   . Anxiety state, unspecified   . Hypertension   . Syncope and collapse     11/12: Holter 12/12 with rare PACS, HR range 64-140, average 82, no significant arrhythmias. Echo (1/13): EF 50-55%, mild  LVH, septal-lateral dyssynchrony c/w LBBB. 3-week event monitor (1/13): No significant arrhythmia.   Marland Kitchen LBBB (left bundle branch block)     LHC in 2002 showed normal coronaries.    Past Surgical History:  Past Surgical History  Procedure Laterality Date  . Tubal ligation      Assessment & Plan Clinical Impression: Mandy Durham is a 69 y.o. right handed female with history of HTN, anxiety, LBBB, syncope and presyncope who was admitted on 01/21/2015 after striking her head due to fall with amnesia of events and complaints of HA. Patient has history of orthostatic hypotension that had improved on mestinon. She was evaluated in ED and CT of the head revealed inferior right frontal hemorrhagic contusion, non-depressed occipital skull fracture, and moderate SAH around R-sylvian fissure. CTA head negative for aneurysm, occlusion or stenosis. Dr. Saintclair Halsted evaluated patient and recommended conservative management with serial CCT. Cardiac echo done revealing EF 50-55% with anteroseptal hypokinesis, calcified MV and grade 1 diastolic dysfunction--no change since last study. Carotid dopplers without significant ICA stenosis. Last CCT with increase in edema and size of right frontal hematoma and was treated with short course of steroids. She has had confusion that's resolving with addition of Seroquel. She continues to have unsteady gait with nausea and vertigo due to BPPV. CIR recommended by MD and rehab team.Patient transferred to CIR on 01/28/2015 .      Pt presents with decreased activity tolerance, decreased functional mobility, decreased balance, decreased memory, decreased problem solving, delayed processing Limiting pt's independence with leisure/community pursuits.   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 Premorbid leisure interest/no interest in trying: Petra Kuba - Flower gardening;Nature - Leary Roca care;Games -  Cross-word;Games - Word-search;Games - Jig-saw puzzles;Sports -  Exercise (Comment) (spin class) Expression Interests: Music (Comment);Singing (religious) Other Leisure Interests: Geneticist, molecular;Reading;Computer;Housework (western movies) Leisure Participation Style: With Family/Friends Awareness of Community Resources: Excellent Psychosocial / Spiritual Spiritual Interests: Church;Womens'Men's Groups Patient agreeable to Pet Therapy: Yes Does patient have pets?: No Social interaction - Mood/Behavior: Cooperative Engineer, drilling for Education?: Yes Patient Agreeable to Outing?: Yes Recreational Therapy Orientation Orientation -Reviewed with patient: Available activity resources Strengths/Weaknesses Patient Strengths/Abilities: Willingness to participate;Active premorbidly Patient weaknesses: Physical limitations TR Patient demonstrates impairments in the following area(s): Endurance;Motor  Plan Rec Therapy Plan Is patient appropriate for Therapeutic Recreation?: Yes Rehab Potential: Good Treatment times per week: Min 1 time per week Estimated Length of Stay: 7 days TR Treatment/Interventions: Adaptive equipment instruction;1:1 session;Balance/vestibular training;Community reintegration;Functional mobility training;Therapeutic activities;Recreation/leisure participation;Patient/family education;Therapeutic exercise;UE/LE Coordination activities  Recommendations for other services: None  Discharge Criteria: Patient will be discharged from TR if patient refuses treatment 3 consecutive times without medical reason.  If treatment goals not met, if there is a change in medical status, if patient makes no progress towards goals or if patient is discharged from hospital.  The above assessment, treatment plan, treatment alternatives and goals were discussed and mutually agreed upon: by patient  [place] 01/30/2015, 12:19 PM

## 2015-01-30 NOTE — Progress Notes (Signed)
Occupational Therapy Session Note  Patient Details  Name: Mandy Durham MRN: [id number] Date of Birth: 05/03/1945  Today's Date: 01/30/2015 OT Individual Time: 9935-7017 OT Individual Time Calculation (min): 54 min    Short Term Goals: No short term goals set  Skilled Therapeutic Interventions/Progress Updates:    Pt seen for BADL retraining with a focus on dynamic standing balance and processing skills. Pt resting in bed and agreeable to a shower. Pt sat to EOB with S but then had 8/10 vertigo symptoms. Sat at EOB for 5-7 min to check blood pressure, wrap R arm IV site. Vertigo symptoms lessening. Standing BP checked, pt felt okay to ambulate to shower with steadying A.  Sat for most of shower, stood with steadying A to wash bottom. Pt needed min cues at times to sequence through steps and did display decreased processing time, but was able to do self care with only steadying A in standing.  Pt's spouse arrived at end of session. Pt opted to sit in w/c for lunch. All needs met.   Therapy Documentation Precautions:  Precautions Precautions: Fall Restrictions Weight Bearing Restrictions: No    Vital Signs: Therapy Vitals BP: (!) 116/49 mmHg Patient Position (if appropriate): Standing Pain: Pain Assessment Pain Assessment: No/denies pain  ADL: ADL ADL Comments: Refer to functional navigator  See Function Navigator for Current Functional Status.   Therapy/Group: Individual Therapy  [place] 01/30/2015, 12:11 PM

## 2015-01-30 NOTE — Progress Notes (Signed)
Occupational Therapy Note  Patient Details  Name: Mandy Durham MRN: [id number] Date of Birth: 12/24/1945  Today's Date: 01/30/2015 OT Individual Time: 1300-1325 OT Individual Time Calculation (min): 25 min   Pt denied pain Individual Therapy  Pt resting in w/c upon arrival with husband present.  Pt agreeable to participating in therapy.  Attempted to engage patient in functional amb without AD to participate in home mgmt tasks and practicing walkin shower transfers.  Pt reported feeling lightheaded when standing.  BP recorded: sitting-114/53 HR 60; in standing 102/52 and HR 66.  Pt continued to report lightheaded feeling when standing.  Pt's husband reported that he noticed that her speech was somewhat slurred since he had arrived approx 2 hrs prior.  RN notified and she stated she would notify PA.   Leotis Shames [facility] 01/30/2015, 1:28 PM

## 2015-01-31 ENCOUNTER — Inpatient Hospital Stay (HOSPITAL_COMMUNITY): Payer: Medicare HMO

## 2015-01-31 ENCOUNTER — Inpatient Hospital Stay (HOSPITAL_COMMUNITY): Payer: Medicare HMO | Admitting: Physical Therapy

## 2015-01-31 ENCOUNTER — Inpatient Hospital Stay (HOSPITAL_COMMUNITY): Payer: Medicare HMO | Admitting: Speech Pathology

## 2015-01-31 NOTE — Progress Notes (Signed)
Orthopedic Tech Progress Note Patient Details:  Seleny MCKENZE NISSLEY [birth year] [id number]  Ortho Devices Type of Ortho Device: Abdominal binder Ortho Device/Splint Location: abdomen Ortho Device/Splint Interventions: Loanne Drilling, Zaron Zwiefelhofer 01/31/2015, 9:55 AM

## 2015-01-31 NOTE — Progress Notes (Signed)
Physical Therapy Session Note  Patient Details  Name: Mandy Durham MRN: [id number] Date of Birth: March 23, 1945  Today's Date: 01/31/2015 PT Individual Time: [id number] PT Individual Time Calculation (min): 31 min   Short Term Goals: Week 1:  PT Short Term Goal 1 (Week 1): = LTGs due to anticipated LOS  Skilled Therapeutic Interventions/Progress Updates:    Patient in supine assist to sitting S with elevated HOB and rail.  Pivot bed >w/c min A.  Propelled w/c x 36' S with cues for safety and technique.  Patient transferred to mat minguard A for vestibular assessment as noted below.  Performed Eply repositioning maneuver for R post canal BPPV x 1 repetition with positive rotary nystagmus upon moving into last position (possible for residual debris in canal vs. Multicanal invovement.)  Assisted back to w/c and to room total A in w/c due to continued wooziness and disorientation.  Left in w/c with all needs in reach and nursing staff informed to wished to rest in bed.  Therapy Documentation Precautions:  Precautions Precautions: Fall Restrictions Weight Bearing Restrictions: No General:   Vital Signs: BP 134/70 HR 76 seated Pain: Pain Assessment Faces Pain Scale: Hurts a little bit Pain Type: Acute pain Pain Location: Head Pain Orientation: Mid Pain Descriptors / Indicators: Aching Pain Onset: On-going Pain Intervention(s): Rest    Other Treatments:      01/31/15 0954  Vestibular Assessment  General Observation Patient reports feeling some better today wearing THT.  Dizziness 7/10  Symptom Behavior  Type of Dizziness Lightheadedness  Frequency of Dizziness intermittent  Duration of Dizziness several minutes  Aggravating Factors Activity in general  Relieving Factors Rest  Occulomotor Exam  Occulomotor Alignment Abnormal (R eye higher in orbit, head tipped L)  Spontaneous Absent  Gaze-induced Absent  Smooth Pursuits Intact  Saccades Slow  Vestibulo-Occular Reflex  VOR  1 Head Only (x 1 viewing) performs with accuracy x 15-20 sec mild increased symptoms  VOR to Slow Head Movement Positive bilaterally (but startled with quick head turn)  VOR Cancellation Corrective saccades (more with head movement to R)  Auditory  Comments intact and equal bilateral to scratch test  Positional Testing  Dix-Hallpike Dix-Hallpike Right  Dix-Hallpike Right  Dix-Hallpike Right Duration >1 min  Dix-Hallpike Right Symptoms Upbeat, right rotatory nystagmus (lasting about 20 sec)     See Function Navigator for Current Functional Status.   Therapy/Group: Individual Therapy  [place], [place] 01/31/2015  01/31/2015, 9:54 AM

## 2015-01-31 NOTE — Progress Notes (Signed)
Speech Language Pathology Daily Session Note  Patient Details  Name: Mandy Durham MRN: [id number] Date of Birth: July 11, 1945  Today's Date: 01/31/2015 SLP Individual Time: 1030-1100 SLP Individual Time Calculation (min): 30 min  Short Term Goals: Week 1: SLP Short Term Goal 1 (Week 1): Patient will demonstrate problem solving with complex and familiar tasks with supervision verbal cues.   SLP Short Term Goal 2 (Week 1): Patient will recall new, daily information with supervision verbal cues.  SLP Short Term Goal 3 (Week 1): Patient will organize and plan throughout functional tasks with supervision verbal cues.   Skilled Therapeutic Interventions: Skilled treatment session focused on cognitive goals. Upon arrival, patient was sitting upright in the wheelchair and appeared brighter compared to yesterday. Patient completed the same basic money management task as yesterday and completed the task in a timely manner with supervision-Min A verbal cues to self-monitor and correct errors. Patient also completed a written expression task for assessment with basic tasks with 100% accuracy. Patient independently requested to use the bathroom and completed the transfer to the commode with supervision verbal cues for safety. Patient was also Mod I for word-finding throughout the session at the conversation level. Patient handed off to RN. Continue with current plan of care.    Function:   Cognition Comprehension Comprehension assist level: Follows basic conversation/direction with no assist  Expression   Expression assist level: Expresses basic needs/ideas: With no assist  Social Interaction Social Interaction assist level: Interacts appropriately 90% of the time - Needs monitoring or encouragement for participation or interaction.  Problem Solving Problem solving assist level: Solves basic 75 - 89% of the time/requires cueing 10 - 24% of the time  Memory Memory assist level: Recognizes or recalls 75 -  89% of the time/requires cueing 10 - 24% of the time    Pain Pain Assessment Faces Pain Scale: Hurts a little bit Pain Type: Acute pain Pain Location: Head Pain Orientation: Mid Pain Descriptors / Indicators: Aching Pain Onset: On-going Pain Intervention(s): Rest  Therapy/Group: Individual Therapy  Emmakate Hypes 01/31/2015, 12:13 PM

## 2015-01-31 NOTE — IPOC Note (Signed)
Overall Plan of Care [facility]) Patient Details Name: Mandy Durham MRN: [id number] DOB: December 14, 1945  Admitting Diagnosis: tramatic SDH   Hospital Problems: Principal Problem:   TBI (traumatic brain injury) [facility]) Active Problems:   Essential hypertension   Acute post-traumatic headache, not intractable     Functional Problem List: Nursing Endurance, Medication Management, Motor, Pain, Safety, Sensory, Skin Integrity  PT Balance, Endurance, Motor, Pain, Safety  OT Balance, Cognition, Endurance  SLP Cognition  TR Endurance, Motor       Basic ADL's: OT Bathing, Dressing, Toileting     Advanced  ADL's: OT Simple Meal Preparation, Light Housekeeping     Transfers: PT Bed Mobility, Bed to Chair, Car, [employer], Futures trader, Metallurgist: PT Ambulation, Emergency planning/management officer, Stairs     Additional Impairments: OT None  SLP Social Cognition   Memory, Problem Solving  TR      Anticipated Outcomes Item Anticipated Outcome  Self Feeding Independent  Swallowing      Basic self-care  supervision with standing activities  Toileting  supervision   Bathroom Transfers supervision  Bowel/Bladder  MOD I  Transfers  mod I  Locomotion  supervision  Communication     Cognition  Supervision   Pain  less than 3  Safety/Judgment  Remain safe while admitted to the Rehab unit   Therapy Plan: PT Intensity: Minimum of 1-2 x/day ,45 to 90 minutes PT Frequency: 5 out of 7 days PT Duration Estimated Length of Stay: 7-10 days OT Intensity: Minimum of 1-2 x/day, 45 to 90 minutes OT Frequency: 5 out of 7 days OT Duration/Estimated Length of Stay: 7days SLP Intensity: Minumum of 1-2 x/day, 30 to 90 minutes SLP Frequency: 3 to 5 out of 7 days SLP Duration/Estimated Length of Stay: 7-10 days        Team Interventions: Nursing Interventions Patient/Family Education, Disease Management/Prevention, Pain Management, Medication Management, Skin Care/Wound Management,  Discharge Planning, Psychosocial Support  PT interventions Ambulation/gait training, Training and development officer, Cognitive remediation/compensation, Community reintegration, Discharge planning, Disease management/prevention, DME/adaptive equipment instruction, Functional mobility training, Neuromuscular re-education, Pain management, Patient/family education, Psychosocial support, Stair training, Therapeutic Activities, Therapeutic Exercise, UE/LE Strength taining/ROM, UE/LE Coordination activities  OT Interventions Training and development officer, Cognitive remediation/compensation, Discharge planning, Community reintegration, DME/adaptive equipment instruction, Functional mobility training, Neuromuscular re-education, Patient/family education, Psychosocial support, Self Care/advanced ADL retraining, Therapeutic Activities, Therapeutic Exercise, UE/LE Strength taining/ROM  SLP Interventions Cognitive remediation/compensation, English as a second language teacher, Patient/family education, Therapeutic Activities, Functional tasks, Environmental controls, Internal/external aids  TR Interventions Adaptive equipment instruction, 1:1 session, Training and development officer, Academic librarian, Functional mobility training, Therapeutic activities, Recreation/leisure participation, Barrister's clerk education, Therapeutic exercise, UE/LE Coordination activities  SW/CM Interventions Discharge Planning, Barrister's clerk, Patient/Family Education    Team Discharge Planning: Destination: PT-Home ,OT- Home , SLP-Home Projected Follow-up: PT-Outpatient PT, 24 hour supervision/assistance, OT-  Home health OT, SLP-24 hour supervision/assistance Projected Equipment Needs: PT-To be determined, OT- Tub/shower seat, SLP-None recommended by SLP Equipment Details: PT-pt reports husband may have RW or cane, OT-  Patient/family involved in discharge planning: PT- Patient,  OT-Patient, Family member/caregiver, SLP-Patient, Family  member/caregiver  MD ELOS: 7-10 days Medical Rehab Prognosis:  Excellent Assessment: The patient has been admitted for CIR therapies with the diagnosis of TBI. The team will be addressing functional mobility, strength, stamina, balance, safety, adaptive techniques and equipment, self-care, bowel and bladder mgt, patient and caregiver education, NMR, visual spatial awareness, pain control, brain injury education, CV mgt/bp control. Goals have been set at supervision to mod I  for mobility/self-care/ADL's/cognition.    Meredith Staggers, MD, FAAPMR      See Team Conference Notes for weekly updates to the plan of care

## 2015-01-31 NOTE — Progress Notes (Signed)
Occupational Therapy Note  Patient Details  Name: Mandy Durham MRN: [id number] Date of Birth: 05-Oct-1945  Today's Date: 01/31/2015 OT Individual Time: 1400-1430 OT Individual Time Calculation (min): 30 min   Pt denied pain Individual therapy  Pt amb with Rollator from gym to ADL apartment and therapist demonstrated walk-in shower transfer using Rollator.  Pt expressed concern that her bathroom is not large enough to accommodate Rollator (confirmed by husband). Discussed seating options with husband.  Husband will notify therapist by Monday but thinks he will get shower seat without back (CSW notified).  Pt's husband will provide supervision and HHA if necessary to access bathroom.   Leotis Shames [facility] 01/31/2015, 2:58 PM

## 2015-01-31 NOTE — Progress Notes (Signed)
Physical Therapy Session Note  Patient Details  Name: Mandy Durham MRN: [id number] Date of Birth: 06-May-1945  Today's Date: 01/31/2015 PT Individual Time: 1130-1200, 1305-1400, and 1500-1600 PT Individual Time Calculation (min): 30 min, 55 min, and 60 min  Short Term Goals: Week 1:  PT Short Term Goal 1 (Week 1): = LTGs due to anticipated LOS  Skilled Therapeutic Interventions/Progress Updates:   Treatment 1: Patient resting in bed, husband in room. Patient transferred supine > sit with no c/o dizziness and upon sit > stand c/o feeling as though "everything is rushing down." Transferred to wheelchair with min guard and seated BP 142/56, standing BP 138/59. Upon second stand, patient reported feeling better and agreeable to ambulation with trial of rollator to increase patient confidence with mobility due to fear of falling and provide a location to rest if patient were to suddenly become lightheaded or dizzy, x 175 ft + 100 ft with supervision. Patient required visual/verbal cues to lock brakes prior to sitting and safe hand placement with sit <> stand. Stair training up/down 12 stairs using 2 rails with supervision and reciprocal pattern ascending, step-to pattern descending. Patient returned to room, husband educated on purpose and trial of rollator and patient reports satisfaction with rollator. Patient sitting edge of bed with needs within reach and husband present.    Treatment 2: Patient resting in bed, transferred to edge of bed without c/o dizziness and ambulated using rollator x 125 ft with supervision. Patient demonstrates moderate fall risk as noted by score of 47/56 on Berg Balance Scale (<36= high risk for falls, close to 100%; 37-45 significant >80%; 46-51 moderate >50%; 52-55 lower >25%). Patient negotiated obstacle course without AD including up/down 4" step, over thresholds, over obstacles, and weaving between cones x 2 trials with seated rest between, supervision overall and no LOB.  Patient also retrieved all items except step from floor and put away with supervision. Patient instructed in standing OTAGO for strengthening, endurance, and falls prevention using rollator for UE support: heel raises x 40, marching x 30, knee flexion x 30. Patient left sitting in gym to await following OT session.   Treatment 3: Patient asleep in bed, easily aroused to voice. Patient sat edge of bed to don shoes. Discussed DME needs with patient and husband regarding rollator versus rolling walker versus no AD, plan to decide on equipment tomorrow. Patient ambulated using rollator community distances with supervision throughout hospital, on/off elevators, over thresholds, on inclines/declines and without AD on grass on incline/decline and up/down brick steps using 1 rail. Patient ambulated throughout carpeted gift shop using rollator with supervision and was able to locate and recall 2 items given by therapist. Patient returned to room and was able to tell husband events of therapy session with mod I. Patient required only one seated rest break and no signs/symptoms of dizziness or lightheadedness throughout session. Patient left sitting edge of bed with needs within reach and husband present.   Therapy Documentation Precautions:  Precautions Precautions: Fall Restrictions Weight Bearing Restrictions: No Pain: Pain Assessment Faces Pain Scale: Hurts a little bit Pain Type: Acute pain Pain Location: Head Pain Orientation: Mid Pain Descriptors / Indicators: Aching Pain Onset: On-going Pain Intervention(s): Rest   See Function Navigator for Current Functional Status.   Therapy/Group: Individual Therapy  Laretta Alstrom 01/31/2015, 12:23 PM

## 2015-01-31 NOTE — Progress Notes (Signed)
Social Work  Social Work Assessment and Plan  Patient Details  Name: Mandy Durham MRN: [id number] Date of Birth: Aug 05, 1945  Today's Date: 01/30/2015  Problem List:  Patient Active Problem List   Diagnosis Date Noted  . TBI (traumatic brain injury) ([place]) 01/28/2015  . Insomnia   . Hypoproteinemia ([place])   . Essential hypertension   . Acute post-traumatic headache, not intractable   . Skull fracture ([place])   . Vertigo due to brain injury ([place])   . Subarachnoid bleed ([place]) 01/22/2015  . SAH (subarachnoid hemorrhage) ([place]) 01/21/2015  . Annual physical exam 09/07/2014  . Orthostatic hypotension 08/30/2014  . Acromioclavicular arthrosis 03/21/2014  . Allergic rhinitis 02/27/2014  . SI (sacroiliac) joint dysfunction 02/02/2014  . Gastrocnemius strain, left 12/11/2013  . Toenail fungus 11/15/2013  . Back pain 08/22/2013  . LBBB (left bundle branch block) 05/13/2013  . Shingles outbreak 12/15/2012  . Borderline diabetes   . Essential hypertension, benign 07/14/2012  . Syncope and collapse 02/15/2011  . Paresthesia of foot 07/07/2010  . INSOMNIA, CHRONIC, MILD 01/12/2008  . OVERWEIGHT 10/12/2007  . HLD (hyperlipidemia) 02/04/2007  . OSTEOPENIA 02/04/2007  . ANXIETY 10/12/2006  . OSTEOARTHRITIS 10/12/2006   Past Medical History:  Past Medical History  Diagnosis Date  . Insomnia   . Adenomatous polyp of colon   . Overweight(278.02)   . Symptomatic menopausal or female climacteric states   . Hyperlipidemia   . Osteoarthrosis, unspecified whether generalized or localized, unspecified site   . Anxiety state, unspecified   . Hypertension   . Syncope and collapse     11/12: Holter 12/12 with rare PACS, HR range 64-140, average 82, no significant arrhythmias. Echo (1/13): EF 50-55%, mild LVH, septal-lateral dyssynchrony c/w LBBB. 3-week event monitor (1/13): No significant arrhythmia.   Marland Kitchen LBBB (left bundle branch block)     LHC in 2002 showed normal coronaries.    Past  Surgical History:  Past Surgical History  Procedure Laterality Date  . Tubal ligation     Social History:  reports that she has never smoked. She has never used smokeless tobacco. She reports that she does not drink alcohol or use illicit drugs.  Family / Support Systems Marital Status: Married How Long?: 91 yrs Patient Roles: Spouse, Parent Spouse/Significant Other: spouse, Alexi Zastoupil @ [phone] Children: Two daughters:  Hoyt Koch (Jenean [place], [place]) and Lisette Grinder [place]) Anticipated Caregiver: husband, Maevery Wasmund Ability/Limitations of Caregiver: husband is available to assist pt. as needed  Caregiver Availability: 24/7 Family Dynamics: Pt's husband is here daily and very supportive.  They both indicate that daughters are supportive, however, working and with families.  Social History Preferred language: English Religion: Non-Denominational Cultural Background: NA Read: Yes Write: Yes Employment Status: Retired Date Retired/Disabled/Unemployed: 3 1/2 yrs Freight forwarder Issues: None Guardian/Conservator: None - per MD, pt is capable of making decisions on her own behalf.   Abuse/Neglect Physical Abuse: Denies Verbal Abuse: Denies Sexual Abuse: Denies Exploitation of patient/patient's resources: Denies Self-Neglect: Denies  Emotional Status Pt's affect, behavior adn adjustment status: Pt very pleasant and able to complete assessment interview without difficulty.  She occasionally looks to husband to assist with answers as she reports feeling "... a little off..." (cognitively).  She denies any s/s of any emotional distress but does not that she has not been sleeping very well.  Discussed possible neuropsychology referral should she feel she is beginning to experience any emotional distress.  No formal depression screen indicated at this time - will  monitor. Recent Psychosocial Issues: None Pyschiatric History: None Substance Abuse History:  None  Patient / Family Perceptions, Expectations & Goals Pt/Family understanding of illness & functional limitations: Pt with basic understanding that she had "some bleeding" to her brain.  Able to describe her vestibular symptoms.  Pt and husband with good understanding of her functional limitations/ need for CIR. Premorbid pt/family roles/activities: Pt completely independent, driving and active PTA.  Both she and husband are retired. Anticipated changes in roles/activities/participation: Husband will need to provide supervision which he is very prepared to do. Pt/family expectations/goals: "I just hope I get better.  Back to my old self."  Recruitment consultant: None Premorbid Home Care/DME Agencies: None Transportation available at discharge: yes Resource referrals recommended: Neuropsychology  Discharge Planning Living Arrangements: Spouse/significant other Support Systems: Spouse/significant other, Children, Friends/neighbors Type of Residence: Private residence Google Resources: [employer] ((Government social research officer)) Financial Resources: Radio broadcast assistant Screen Referred: No Living Expenses: Own Money Management: Spouse Does the patient have any problems obtaining your medications?: No Home Management: shared between pt and spouse Patient/Family Preliminary Plans: Pt to return home with spouse providing 24/7 supervision/ assist as needed. Social Work Anticipated Follow Up Needs: HH/OP Expected length of stay: 7-10 days  Clinical Impression Very pleasant woman here following a fall at home and suffering a TBI.  Husband at bedside and extremely supportive and able to provide 24/7 assistance.  Pt making good gains with her cognition and physical recovery and anticipate a short LOS.  Will follow for support and d/c planning needs.  Jarred Purtee 01/30/2015, 2:02 PM

## 2015-01-31 NOTE — Progress Notes (Signed)
Occupational Therapy Session Note  Patient Details  Name: Mandy Durham MRN: [id number] Date of Birth: 06/28/1945  Today's Date: 01/31/2015 OT Individual Time: 0800-0900 OT Individual Time Calculation (min): 60 min    Short Term Goals: Week 1:  OT Short Term Goal 1 (Week 1): STGs = LTGs due to short LOS  Skilled Therapeutic Interventions/Progress Updates:    Pt resting in bed upon arrival.  Pt stated she took shower the previous day and usually only showers every other day.  Pt engaged in BADL retraining including bathing and dressing with sit<>stand from w/c at sink.  Pt amb with HHA in room to gather clothingPt c/o minimal dizziness with sit<>stand but requested to rest after standing at sink to brush teeth.  Pt transitioned to therapy gym and attempted to engage in functional amb with HHA but patient c/o dizziness and returned to room.  BP measurements throughout session remained constant in sitting and standing - sitting 129/44; standing 122-52. Focus on activity tolerance, sit<>stand, standing balance, functional amb without AD, discharge planning, and safety awareness.  Therapy Documentation Precautions:  Precautions Precautions: Fall Restrictions Weight Bearing Restrictions: No  Pain:  Prt denied pain   See Function Navigator for Current Functional Status.   Therapy/Group: Individual Therapy  Leroy Libman 01/31/2015, 9:01 AM

## 2015-01-31 NOTE — Progress Notes (Addendum)
Mandy Durham PHYSICAL MEDICINE & REHABILITATION     PROGRESS NOTE    Subjective/Complaints: Had another good night. Headaches still under control. Appetite good. .   ROS: Pt denies fever, rash/itching, nausea, vomiting, abdominal pain, diarrhea, chest pain, shortness of breath, palpitations, dysuria, dizziness, neck or back pain, bleeding, anxiety, or depression    Objective: Vital Signs: Blood pressure 159/65, pulse 55, temperature 97.6 F (36.4 C), temperature source Oral, resp. rate 16, height 5' 6.5" (1.689 m), weight 69.99 kg (154 lb 4.8 oz), SpO2 97 %. No results found.  Recent Labs  01/29/15 0503  WBC 9.4  HGB 13.4  HCT 40.6  PLT 386    Recent Labs  01/29/15 0503  NA 137  K 4.0  CL 100*  GLUCOSE 108*  BUN 15  CREATININE 0.88  CALCIUM 9.1   CBG (last 3)  No results for input(s): GLUCAP in the last 72 hours.  Wt Readings from Last 3 Encounters:  01/30/15 69.99 kg (154 lb 4.8 oz)  01/22/15 51.71 kg (114 lb)  09/07/14 69.4 kg (153 lb)    Physical Exam:  Constitutional: She is oriented to person, place, and time. She appears well-developed and well-nourished. No distress.  HENT:  Head: Normocephalic.   Eyes: Conjunctivae and EOM are normal. Pupils are equal, round, and reactive to light.  Neck: Normal range of motion. Neck supple.  Cardiovascular: Normal rate and regular rhythm.  Respiratory: Effort normal and breath sounds normal. No respiratory distress. She has no wheezes.  GI: Soft. Bowel sounds are normal. She exhibits no distension. There is no tenderness.  Musculoskeletal: She exhibits no edema or tenderness.  Neuro:  Strength b/l UE 4+/5 proximal to distal  B/l LE: 4/5 proximally, 4+/5 distally    She is alert and oriented to person, place, and time. Negative PD.   Voice improved. Speech stuttering. She is able to follow one and two step commands without difficulty.  Skin: Skin is warm and dry. No rash noted. She is not diaphoretic. No  erythema.  Psychiatric: Her behavior is normal. Thought content normal.  Affect more dynamic   Assessment/Plan: 1. Functional deficits secondary to TBI which require 3+ hours per day of interdisciplinary therapy in a comprehensive inpatient rehab setting. Physiatrist is providing close team supervision and 24 hour management of active medical problems listed below. Physiatrist and rehab team continue to assess barriers to discharge/monitor patient progress toward functional and medical goals.  Function:  Bathing Bathing position   Position: Shower  Bathing parts Body parts bathed by patient: Right arm, Left arm, Chest, Abdomen, Front perineal area, Buttocks, Right upper leg, Left upper leg, Right lower leg, Left lower leg Body parts bathed by helper: Back  Bathing assist Assist Level: Touching or steadying assistance(Pt > 75%)      Upper Body Dressing/Undressing Upper body dressing   What is the patient wearing?: Pull over shirt/dress     Pull over shirt/dress - Perfomed by patient: Thread/unthread right sleeve, Thread/unthread left sleeve, Put head through opening, Pull shirt over trunk          Upper body assist        Lower Body Dressing/Undressing Lower body dressing   What is the patient wearing?: Pants, Non-skid slipper socks, Underwear Underwear - Performed by patient: Thread/unthread right underwear leg, Thread/unthread left underwear leg, Pull underwear up/down   Pants- Performed by patient: Thread/unthread right pants leg, Thread/unthread left pants leg, Pull pants up/down   Non-skid slipper socks- Performed by patient: Don/doff  right sock, Don/doff left sock                    Lower body assist Assist for lower body dressing: Touching or steadying assistance (Pt > 75%)      Toileting Toileting   Toileting steps completed by patient: Adjust clothing prior to toileting, Performs perineal hygiene, Adjust clothing after toileting   Toileting Assistive  Devices: Grab bar or rail  Toileting assist Assist level: Touching or steadying assistance (Pt.75%)   Transfers Chair/bed transfer   Chair/bed transfer method: Ambulatory Chair/bed transfer assist level: Touching or steadying assistance (Pt > 75%)       Locomotion Ambulation     Max distance: 130 ft Assist level: Touching or steadying assistance (Pt > 75%)   Wheelchair          Cognition Comprehension Comprehension assist level: Follows basic conversation/direction with no assist  Expression Expression assist level: Expresses basic needs/ideas: With no assist  Social Interaction Social Interaction assist level: Interacts appropriately 90% of the time - Needs monitoring or encouragement for participation or interaction.  Problem Solving Problem solving assist level: Solves basic 50 - 74% of the time/requires cueing 25 - 49% of the time  Memory Memory assist level: Recognizes or recalls 75 - 89% of the time/requires cueing 10 - 24% of the time   Medical Problem List and Plan: 1. Gait, balance, cognitive deficits secondary to traumatic right frontal hemorrhagic contusion, occipital skull fracture and SAH -continue CIR therapies 2. DVT Prophylaxis/Anticoagulation: Mechanical: Sequential compression devices, below knee Bilateral lower extremities and increase mobility/activity level.  3. Pain Management: persistent h/a. Prn vicodin  -continue scheduled hs topamax 12.5mg  (decreased due to slurred speech yesterday) 4. Mood: continue Paxil. LCSW to follow for evaluation and support.  5. Neuropsych: This patient is capable of making decisions on her own behalf.  -hs seroquel for sleep/confusion---effective 6. Skin/Wound Care: Routine pressure relief measures. Able to reposition independently 7. Fluids/Electrolytes/Nutrition: good appetite this am  -I personally reviewed the patient's labs today. Labs normal. 8. LBBB/Supine HTN: On metoprolol bid. Mestonin to help with  orthostatic changes as florinef ineffective.   -track bp's with increased activities on rehab  -bp dropping when up with therapy  -teds/abdominal binder  -continue to acclimate 9. Insomnia: (Used Xanax for sleep at home). Seroquel is effective.  10. Low protein stores: Added supplement LOS (Days) 3 A FACE TO FACE EVALUATION WAS PERFORMED  Mandy Durham T 01/31/2015 8:21 AM

## 2015-02-01 ENCOUNTER — Inpatient Hospital Stay (HOSPITAL_COMMUNITY): Payer: Medicare HMO | Admitting: Physical Therapy

## 2015-02-01 ENCOUNTER — Inpatient Hospital Stay (HOSPITAL_COMMUNITY): Payer: Medicare HMO | Admitting: Occupational Therapy

## 2015-02-01 ENCOUNTER — Inpatient Hospital Stay (HOSPITAL_COMMUNITY): Payer: Medicare HMO | Admitting: Speech Pathology

## 2015-02-01 NOTE — Progress Notes (Signed)
Speech Language Pathology Daily Session Note  Patient Details  Name: Mandy Durham MRN: [id number] Date of Birth: 08/22/1945  Today's Date: 02/01/2015 SLP Individual Time: 0900-1000 SLP Individual Time Calculation (min): 60 min  Short Term Goals: Week 1: SLP Short Term Goal 1 (Week 1): Patient will demonstrate problem solving with complex and familiar tasks with supervision verbal cues.   SLP Short Term Goal 2 (Week 1): Patient will recall new, daily information with supervision verbal cues.  SLP Short Term Goal 3 (Week 1): Patient will organize and plan throughout functional tasks with supervision verbal cues.   Skilled Therapeutic Interventions:Skilled treatment session focused on cognitive goals. Upon arrival, patient was sitting upright in the recliner and independently requested to use the bathroom. SLP facilitated session by providing Mod A verbal and question cues for problem solving and safety with the rollator during transfer.  SLP also facilitated session by providing extra timer and Min A question cues for functional problem solving and organization during a mildly complex money management task.  Task was unable to be completed due to patient requesting to use the bathroom again at the end of session and only required supervision to perform the transfer without the use of an assistive device. Patient transferred back to bed at end of session and left with all needs within reach. Continue with current plan of care.     Function:  Cognition Comprehension Comprehension assist level: Follows basic conversation/direction with no assist  Expression   Expression assist level: Expresses basic needs/ideas: With no assist  Social Interaction Social Interaction assist level: Interacts appropriately 90% of the time - Needs monitoring or encouragement for participation or interaction.  Problem Solving Problem solving assist level: Solves basic 75 - 89% of the time/requires cueing 10 - 24% of  the time  Memory Memory assist level: Recognizes or recalls 75 - 89% of the time/requires cueing 10 - 24% of the time    Pain Pain Assessment Pain Assessment: No/denies pain  Therapy/Group: Individual Therapy  Herson Prichard, [place] 02/01/2015, 4:25 PM

## 2015-02-01 NOTE — Progress Notes (Signed)
Occupational Therapy Session Note  Patient Details  Name: Mandy Durham MRN: [id number] Date of Birth: 04-01-1945  Today's Date: 02/01/2015 OT Individual Time: [id number] OT Individual Time Calculation (min): 57 min    Short Term Goals: Week 1:  OT Short Term Goal 1 (Week 1): STGs = LTGs due to short LOS  Skilled Therapeutic Interventions/Progress Updates:    Pt completed bathing, dressing, and grooming tasks during session.  She needed mod instructional cueing to participate secondary to repeatedly saying she didn't feel like she could do it.  Encouraged her to try as hard as she could and that therapist would be with her to keep her from falling during all tasks.  Finally, she agreed to participate.  She was able to gather all items needed for bathing and for dressing with min hand held assist.  Pt's BP 142/64 in sitting and 127/69 in standing without use of abdominal binder, but pt did have TEDs on with standing BP.   She was able to stand with close supervision to complete grooming tasks of brushing her hair and her teeth.  Once finished perform short distance ambulation in the hallway to the gym and back with min assist.  Pt with no report of dizziness except with initial sitting EOB, which was rated as being very little.  Pt left in recliner for next SLP session.    Therapy Documentation Precautions:  Precautions Precautions: Fall Restrictions Weight Bearing Restrictions: No  Pain: Pain Assessment Pain Assessment: No/denies pain Pain Score: 2  ADL: ADL ADL Comments: Refer to functional navigator  See Function Navigator for Current Functional Status.   Therapy/Group: Individual Therapy  Tray Klayman OTR/L 02/01/2015, 12:27 PM

## 2015-02-01 NOTE — Progress Notes (Signed)
Physical Therapy Session Note  Patient Details  Name: Valaree MASYN MATTE MRN: [id number] Date of Birth: [birth year]  Today's Date: 02/01/2015 PT Individual Time: 1450-1550 PT Individual Time Calculation (min): 60 min   Short Term Goals: Week 1:  PT Short Term Goal 1 (Week 1): = LTGs due to anticipated LOS  Skilled Therapeutic Interventions/Progress Updates:   Patient resting in bed, slight dizziness with supine > sit edge of bed. Gait without AD in room to bathroom for toileting and sink for hand hygiene and to and from ADL kitchen with supervision, patient demonstrating slow hesitant gait speed and slight forward flexed posture with minimal arm swing. Patient participated in basic and familiar kitchen task of making brownies with overall min multimodal cues for organization, functional problem solving, and working memory and increased time to complete task. Discussed energy conservation techniques and patient utilized kitchen stool for seated rest breaks during task and for reading instructions on box as she appeared to have difficulty with dual task standing and reading instructions/problem solving steps. Discussed use of rollator in kitchen for seated rest as needed, patient verbalized understanding. Patient returned to room and left sitting edge of bed with husband in room and needs within reach.    Therapy Documentation Precautions:  Precautions Precautions: Fall Restrictions Weight Bearing Restrictions: No Pain: Pain Assessment Pain Assessment: Faces Faces Pain Scale: Hurts a little bit Pain Type: Acute pain Pain Location: Head Pain Descriptors / Indicators: Headache Pain Onset: On-going Pain Intervention(s): Rest  See Function Navigator for Current Functional Status.   Therapy/Group: Individual Therapy  Laretta Alstrom 02/01/2015, 4:39 PM

## 2015-02-01 NOTE — Progress Notes (Signed)
Occupational Therapy Session Note  Patient Details  Name: Mandy Durham MRN: [id number] Date of Birth: Mar 24, 1945  Today's Date: 02/01/2015 OT Individual Time: 1030-1100 OT Individual Time Calculation (min): 30 min    Short Term Goals: Week 1:  OT Short Term Goal 1 (Week 1): STGs = LTGs due to short LOS  Skilled Therapeutic Interventions/Progress Updates:    Treatment session with focus on functional mobility and standing balance during IADL tasks.  Pt ambulated to therapy gym with Rollator, requiring cues to lock/unlock brakes prior to sit <> stand.  Engaged in reaching task to challenge standing balance with pt completing trunk rotation to Rt and Lt with minimal reports of dizziness with movement.  Engaged in folding sheets in standing, with pt reporting enjoying home management tasks and discussing typical routines at home.  Pt returned to room and left seated in recliner.  Therapy Documentation Precautions:  Precautions Precautions: Fall Restrictions Weight Bearing Restrictions: No General:   Vital Signs: Therapy Vitals Temp: 97.5 F (36.4 C) Temp Source: Oral Pulse Rate: (!) 56 Resp: 16 BP: (!) 159/60 mmHg Patient Position (if appropriate): Lying Oxygen Therapy SpO2: 98 % O2 Device: Not Delivered Pain: Pain Assessment Pain Assessment: No/denies pain  See Function Navigator for Current Functional Status.   Therapy/Group: Individual Therapy  Simonne Come 02/01/2015, 4:00 PM

## 2015-02-01 NOTE — Progress Notes (Signed)
[place] PHYSICAL MEDICINE & REHABILITATION     PROGRESS NOTE    Subjective/Complaints: Still having some dizziness. Light headedness more than "room spinning."   ROS: Pt denies fever, rash/itching, nausea, vomiting, abdominal pain, diarrhea, chest pain, shortness of breath, palpitations, dysuria, dizziness, neck or back pain, bleeding, anxiety, or depression    Objective: Vital Signs: Blood pressure 143/60, pulse 62, temperature 98.1 F (36.7 C), temperature source Oral, resp. rate 17, height 5' 6.5" (1.689 m), weight 69.99 kg (154 lb 4.8 oz), SpO2 99 %. No results found. No results for input(s): WBC, HGB, HCT, PLT in the last 72 hours. No results for input(s): NA, K, CL, GLUCOSE, BUN, CREATININE, CALCIUM in the last 72 hours.  Invalid input(s): CO CBG (last 3)  No results for input(s): GLUCAP in the last 72 hours.  Wt Readings from Last 3 Encounters:  01/30/15 69.99 kg (154 lb 4.8 oz)  01/22/15 51.71 kg (114 lb)  09/07/14 69.4 kg (153 lb)    Physical Exam:  Constitutional: She is oriented to person, place, and time. She appears well-developed and well-nourished. No distress.  HENT:  Head: Normocephalic.   Eyes: Conjunctivae and EOM are normal. Pupils are equal, round, and reactive to light.  Neck: Normal range of motion. Neck supple.  Cardiovascular: Normal rate and regular rhythm.  Respiratory: Effort normal and breath sounds normal. No respiratory distress. She has no wheezes.  GI: Soft. Bowel sounds are normal. She exhibits no distension. There is no tenderness.  Musculoskeletal: She exhibits no edema or tenderness.  Neuro: no obvious nystagmus Strength b/l UE 4+/5 proximal to distal  B/l LE: 4/5 proximally, 4+/5 distally    She is alert and oriented to person, place, and time. Negative PD.   Voice improved. Speech stuttering at times. She is able to follow one and two step commands without difficulty.  Skin: Skin is warm and dry. No rash noted. She is not  diaphoretic. No erythema.  Psychiatric: Her behavior is normal. Thought content normal.  Affect fairly dynamic   Assessment/Plan: 1. Functional deficits secondary to TBI which require 3+ hours per day of interdisciplinary therapy in a comprehensive inpatient rehab setting. Physiatrist is providing close team supervision and 24 hour management of active medical problems listed below. Physiatrist and rehab team continue to assess barriers to discharge/monitor patient progress toward functional and medical goals.  Function:  Bathing Bathing position   Position: Wheelchair/chair at sink  Bathing parts Body parts bathed by patient: Right arm, Left arm, Chest, Abdomen, Front perineal area, Buttocks, Right upper leg, Left upper leg, Right lower leg, Left lower leg Body parts bathed by helper: Back  Bathing assist Assist Level: Touching or steadying assistance(Pt > 75%)      Upper Body Dressing/Undressing Upper body dressing   What is the patient wearing?: Pull over shirt/dress     Pull over shirt/dress - Perfomed by patient: Thread/unthread right sleeve, Thread/unthread left sleeve, Put head through opening, Pull shirt over trunk          Upper body assist Assist Level: Supervision or verbal cues      Lower Body Dressing/Undressing Lower body dressing   What is the patient wearing?: Pants, Non-skid slipper socks, Underwear Underwear - Performed by patient: Thread/unthread right underwear leg, Thread/unthread left underwear leg, Pull underwear up/down   Pants- Performed by patient: Thread/unthread right pants leg, Thread/unthread left pants leg, Pull pants up/down   Non-skid slipper socks- Performed by patient: Don/doff right sock, Don/doff left sock  Lower body assist Assist for lower body dressing: Touching or steadying assistance (Pt > 75%)      Toileting Toileting   Toileting steps completed by patient: Adjust clothing prior to toileting, Performs  perineal hygiene, Adjust clothing after toileting   Toileting Assistive Devices: Grab bar or rail  Toileting assist Assist level: Touching or steadying assistance (Pt.75%)   Transfers Chair/bed transfer   Chair/bed transfer method: Ambulatory Chair/bed transfer assist level: Touching or steadying assistance (Pt > 75%) Chair/bed transfer assistive device: Armrests, Other (rollator)     Locomotion Ambulation     Max distance: 1000 ft Assist level: Supervision or verbal cues   Wheelchair          Cognition Comprehension Comprehension assist level: Follows basic conversation/direction with no assist  Expression Expression assist level: Expresses basic needs/ideas: With no assist  Social Interaction Social Interaction assist level: Interacts appropriately 90% of the time - Needs monitoring or encouragement for participation or interaction.  Problem Solving Problem solving assist level: Solves basic 50 - 74% of the time/requires cueing 25 - 49% of the time  Memory Memory assist level: Recognizes or recalls 75 - 89% of the time/requires cueing 10 - 24% of the time   Medical Problem List and Plan: 1. Gait, balance, cognitive deficits secondary to traumatic right frontal hemorrhagic contusion, occipital skull fracture and SAH -continue CIR therapies 2. DVT Prophylaxis/Anticoagulation: Mechanical: Sequential compression devices, below knee Bilateral lower extremities and increase mobility/activity level.  3. Pain Management: persistent h/a. Prn vicodin  -continue scheduled hs topamax 12.5mg  (decreased due to slurred speech yesterday) 4. Mood: continue Paxil. LCSW to follow for evaluation and support.  5. Neuropsych: This patient is capable of making decisions on her own behalf.  -hs seroquel for sleep/confusion---effective 6. Skin/Wound Care: Routine pressure relief measures. Able to reposition independently 7. Fluids/Electrolytes/Nutrition: good appetite this am  -I  personally reviewed the patient's labs today. Labs normal. 8. LBBB/Supine HTN/light headedness: On metoprolol bid for supine hypertension  - Mestonin to help with orthostatic changes as florinef was ineffective.   -track bp's with increased activities on rehab  -bp dropping when up with therapy  -teds/abdominal binder, encourage fluids  -continue to acclimate  -vestibular therapy also 9. Insomnia: (Used Xanax for sleep at home). Seroquel is effective.  10. Low protein stores: Added supplement LOS (Days) 4 A FACE TO FACE EVALUATION WAS PERFORMED  SWARTZ,ZACHARY T 02/01/2015 8:23 AM

## 2015-02-01 NOTE — Progress Notes (Signed)
Physical Therapy Session Note  Patient Details  Name: Mandy Durham MRN: [id number] Date of Birth: Jun 01, 1945  Today's Date: 02/01/2015 PT Individual Time: 1100-1158 PT Individual Time Calculation (min): 58 min   Short Term Goals: Week 1:  PT Short Term Goal 1 (Week 1): = LTGs due to anticipated LOS  Skilled Therapeutic Interventions/Progress Updates:   Pt received in recliner after OT session.  Pt reporting improved pain and dizziness after treatment yesterday but still c/o intermittent dizziness.  Discussed with pt purpose of re-assessment and possibility of needing another treatment to treat BPPV and decrease falls risk.  Pt very anxious and had a lot of questions for PT regarding BPPV; discussed incidence of BPPV after fall and head trauma, prognosis after treatment and options for re-treatment if it returns.  Pt agreeable to re-assessment.  Pt transferred from recliner and ambulated with rollator x 100' to ortho gym with supervision.  Pt vitals assessed prior to re-assessment.  Demonstrated assessment and treatment to patient.  Pt agreeable to modified testing position with pillows under her back.  Performed R Hallpike-Dix over pillows with no reports of vertigo and no nystagmus but due to shift in pillow pt not in full 20 deg extension; upon returning to sitting nystagmus noted with pt reporting vertigo.  After prolonged rest pt returned to R hallpike-dix with pillows in better position with head in improved position; no nystagmus noted with R head turn but when neck/head rotated to L pt presented with L torsion nystagmus of short duration and vertigo.  Pt did not wish to go through repositioning maneuver for L BPPV due to feeling very anxious and nauseous.  Pt returned to sitting slowly and after prolonged rest pt transported back to room seated on rollator.  Pt transferred to w/c with min A due to fear of dizziness.  Husband present; educated husband on findings and plan for treatment.  This  therapist will f/u with pt tomorrow or on Monday for treatment of L BPPV.  Therapy Documentation Precautions:  Precautions Precautions: Fall Restrictions Weight Bearing Restrictions: No Vital Signs: Therapy Vitals Pulse Rate: (!) 59 BP: (!) 158/64 mmHg Patient Position (if appropriate): Sitting Pain: Pain Assessment Pain Assessment: No/denies pain Pain Score: 2    See Function Navigator for Current Functional Status.   Therapy/Group: Individual Therapy  Raylene Everts [facility] 02/01/2015, 12:18 PM

## 2015-02-02 ENCOUNTER — Inpatient Hospital Stay (HOSPITAL_COMMUNITY): Payer: Medicare HMO | Admitting: Speech Pathology

## 2015-02-02 ENCOUNTER — Inpatient Hospital Stay (HOSPITAL_COMMUNITY): Payer: Medicare HMO | Admitting: Physical Therapy

## 2015-02-02 ENCOUNTER — Inpatient Hospital Stay (HOSPITAL_COMMUNITY): Payer: Medicare HMO | Admitting: Occupational Therapy

## 2015-02-02 NOTE — Plan of Care (Signed)
Problem: RH PAIN MANAGEMENT Goal: RH STG PAIN MANAGED AT OR BELOW PT'S PAIN GOAL < 4 on 0-10 pain scale.  Outcome: Progressing On scheduled tylenol pt responding well with complaints of headache occassionally

## 2015-02-02 NOTE — Progress Notes (Signed)
Speech Language Pathology Daily Session Note  Patient Details  Name: Mandy Durham MRN: [id number] Date of Birth: June 24, 1945  Today's Date: 02/02/2015 SLP Individual Time: [id number] SLP Individual Time Calculation (min): 40 min  Short Term Goals: Week 1: SLP Short Term Goal 1 (Week 1): Patient will demonstrate problem solving with complex and familiar tasks with supervision verbal cues.   SLP Short Term Goal 2 (Week 1): Patient will recall new, daily information with supervision verbal cues.  SLP Short Term Goal 3 (Week 1): Patient will organize and plan throughout functional tasks with supervision verbal cues.   Skilled Therapeutic Interventions: Skilled treatment session focused on addressing cognitive goals. Patient participated in a new learning task that focused on recall of new information with Max assist visual and verbal cues to complete procedures of task accurately.  SLP then implemented an external memory aid that patient was able to use with [place] I by end of session.  Patient reported that she utilized writing notes prior to admission and SLP encouraged patient to continue to use this strategy in therapies during her stay.  Continue with current plan of care.   Function:  Cognition Comprehension Comprehension assist level: Follows complex conversation/direction with extra time/assistive device  Expression   Expression assist level: Expresses basic 75 - 89% of the time/requires cueing 10 - 24% of the time. Needs helper to occlude trach/needs to repeat words.  Social Interaction Social Interaction assist level: Interacts appropriately 90% of the time - Needs monitoring or encouragement for participation or interaction.  Problem Solving Problem solving assist level: Solves basic 75 - 89% of the time/requires cueing 10 - 24% of the time  Memory Memory assist level: Recognizes or recalls 75 - 89% of the time/requires cueing 10 - 24% of the time    Pain Pain  Assessment Pain Assessment: No/denies pain  Therapy/Group: Individual Therapy  Carmelia Roller., [place] [id number]  [place] 02/02/2015, 10:31 AM

## 2015-02-02 NOTE — Progress Notes (Addendum)
Physical Therapy Session Note  Patient Details  Name: Mandy Durham MRN: [id number] Date of Birth: [birth year]  Today's Date: 02/02/2015 PT Individual Time: 1330-1430 PT Individual Time Calculation (min): 60 min   Short Term Goals: Week 1:  PT Short Term Goal 1 (Week 1): = LTGs due to anticipated LOS  Skilled Therapeutic Interventions/Progress Updates:   Pt received in bed with husband present.  Pt still reporting dizziness and nausea and requesting anti nausea medication before beginning; RN alerted.  Pt transferred to EOB with supervision and performed ambulation with rollator to gym for family education.  Discussed with pt and husband stair negotiation for home entry/exit; reports 3 stairs for carport entrance, no rails but reports it may be easier to enter front door via sidewalk and 2 steps, no rails but a porch at the top to rest on.  Discussed sequence with pt and husband of leaving RW at bottom, negotiating 2 steps and then sitting in chair on porch while husband places RW onto porch; pt then has to negotiate one step with RW.  For leaving the house the pt will sit in the chair on the porch while her husband places the RW down on the ground.  Husband to provide min A during stair negotiation due to no rails.  Had pt and husband return demonstrate stair negotiation up/down 4 stairs x 2 reps, no rail with husband providing HHA for balance and pt performing step to sequence.  Also demonstrated to husband proper hand placement for control of COG and how to fold up RW to place it in truck or onto porch.    Ambulated to small gym where car set up to simulate tall truck with running board.  Pt performed transfer into truck by stepping into truck while holding grab bar; pt able to do safely with supervision and husband reports this is how pt did it baseline.  Also practiced transfer on/off of flat bed, no rails.  Pt able to transfer to bed and perform sitting <> L side with Mod I; no reports of  dizziness when performed slowly.    Performed reassessment of BPPV with L hallpike-dix.  No dizziness noted and no nystagmus noted.  When head turned to R and pt rolled R pt reported dizziness with mild ageotropic nystagmus.  Pt very fearful of falling so pt assisted to sitting and allowed dizziness to settle.  Pt declined further testing or treatment for today; would like to f/u with pt on Monday to assess horizontal canals.  Pt returned to room and to EOB with RW and supervision.  Addressed husband's questions about equipment for home.  Pt left with husband present and all items within reach.      Therapy Documentation Precautions:  Precautions Precautions: Fall Restrictions Weight Bearing Restrictions: No Vital Signs: Therapy Vitals Temp: 97.8 F (36.6 C) Temp Source: Oral Pulse Rate: 64 Resp: 16 BP: (!) 130/59 mmHg Patient Position (if appropriate): Sitting Oxygen Therapy SpO2: 97 % O2 Device: Not Delivered Pain: Pain Assessment Pain Assessment: No/denies pain   See Function Navigator for Current Functional Status.   Therapy/Group: Individual Therapy  Raylene Everts [facility] 02/02/2015, 4:02 PM

## 2015-02-02 NOTE — Progress Notes (Signed)
[facility] PHYSICAL MEDICINE & REHABILITATION     PROGRESS NOTE    Subjective/Complaints: Patient states she slept well overnight. She denies complaints this morning.  ROS:  Denies CP, SOB, N/V/D.  Objective: Vital Signs: Blood pressure 125/52, pulse 61, temperature 98.4 F (36.9 C), temperature source Oral, resp. rate 17, height 5' 6.5" (1.689 m), weight 69.99 kg (154 lb 4.8 oz), SpO2 97 %. No results found. No results for input(s): WBC, HGB, HCT, PLT in the last 72 hours. No results for input(s): NA, K, CL, GLUCOSE, BUN, CREATININE, CALCIUM in the last 72 hours.  Invalid input(s): CO CBG (last 3)  No results for input(s): GLUCAP in the last 72 hours.  Wt Readings from Last 3 Encounters:  01/30/15 69.99 kg (154 lb 4.8 oz)  01/22/15 51.71 kg (114 lb)  09/07/14 69.4 kg (153 lb)    Physical Exam:  Constitutional: She appears well-developed and well-nourished. No distress.  HENT:  Head: Normocephalic.   Eyes: Conjunctivae and EOM are normal.  Neck: Normal range of motion.  Cardiovascular: Normal rate and regular rhythm. +Murmur Respiratory: Effort normal and breath sounds normal. No respiratory distress. She has no wheezes.  GI: Soft. Bowel sounds are normal. She exhibits no distension. There is no tenderness.  Musculoskeletal: She exhibits no edema or tenderness.  Neuro:  Strength b/l UE 4+/5 proximal to distal  B/l LE: 4/5 proximally, 4+/5 distally  She is alert and oriented to person and place   She is able to follow one and two step commands without difficulty.  Skin: Skin is warm and dry. No rash noted. She is not diaphoretic. No erythema.  Psychiatric: Her behavior is normal. Thought content normal.   Assessment/Plan: 1. Functional deficits secondary to TBI which require 3+ hours per day of interdisciplinary therapy in a comprehensive inpatient rehab setting. Physiatrist is providing close team supervision and 24 hour management of active medical problems listed  below. Physiatrist and rehab team continue to assess barriers to discharge/monitor patient progress toward functional and medical goals.  Function:  Bathing Bathing position   Position: Shower  Bathing parts Body parts bathed by patient: Right arm, Left arm, Chest, Abdomen, Front perineal area, Buttocks, Right upper leg, Left upper leg, Right lower leg, Left lower leg Body parts bathed by helper: Back  Bathing assist Assist Level: Supervision or verbal cues      Upper Body Dressing/Undressing Upper body dressing   What is the patient wearing?: Pull over shirt/dress     Pull over shirt/dress - Perfomed by patient: Thread/unthread right sleeve, Thread/unthread left sleeve, Put head through opening, Pull shirt over trunk          Upper body assist Assist Level: Supervision or verbal cues      Lower Body Dressing/Undressing Lower body dressing   What is the patient wearing?: [employer], Pants, Socks, Scientist, water quality - Performed by patient: Thread/unthread right underwear leg, Thread/unthread left underwear leg, Pull underwear up/down   Pants- Performed by patient: Thread/unthread right pants leg, Thread/unthread left pants leg, Pull pants up/down, Fasten/unfasten pants   Non-skid slipper socks- Performed by patient: Don/doff right sock, Don/doff left sock       Shoes - Performed by patient: Don/doff right shoe, Don/doff left shoe, Fasten right, Fasten left       TED Hose - Performed by patient: Don/doff right TED hose, Don/doff left TED hose TED Hose - Performed by helper: Don/doff right TED hose, Don/doff left TED hose  Lower body assist Assist for  lower body dressing: Touching or steadying assistance (Pt > 75%)      Toileting Toileting   Toileting steps completed by patient: Adjust clothing prior to toileting, Performs perineal hygiene, Adjust clothing after toileting   Toileting Assistive Devices: Grab bar or rail  Toileting assist Assist level: Supervision or  verbal cues   Transfers Chair/bed transfer   Chair/bed transfer method: Ambulatory Chair/bed transfer assist level: Supervision or verbal cues Chair/bed transfer assistive device: Armrests, Other (rollator)     Locomotion Ambulation     Max distance: 100 ft Assist level: Supervision or verbal cues   Wheelchair          Cognition Comprehension Comprehension assist level: Follows complex conversation/direction with no assist  Expression Expression assist level: Expresses basic 75 - 89% of the time/requires cueing 10 - 24% of the time. Needs helper to occlude trach/needs to repeat words.  Social Interaction Social Interaction assist level: Interacts appropriately 90% of the time - Needs monitoring or encouragement for participation or interaction.  Problem Solving Problem solving assist level: Solves basic 75 - 89% of the time/requires cueing 10 - 24% of the time  Memory Memory assist level: Recognizes or recalls 75 - 89% of the time/requires cueing 10 - 24% of the time   Medical Problem List and Plan: 1. Gait, balance, cognitive deficits secondary to traumatic right frontal hemorrhagic contusion, occipital skull fracture and SAH -continue CIR therapies 2. DVT Prophylaxis/Anticoagulation: Mechanical: Sequential compression devices, below knee Bilateral lower extremities and increase mobility/activity level.  3. Pain Management: persistent h/a. Prn vicodin  -continue scheduled hs topamax 12.5mg  (decreased due to slurred speech) 4. Mood: continue Paxil. LCSW to follow for evaluation and support.  5. Neuropsych: This patient is capable of making decisions on her own behalf.  -hs seroquel for sleep/confusion---effective 6. Skin/Wound Care: Routine pressure relief measures. Able to reposition independently 7. Fluids/Electrolytes/Nutrition:   - Eating anywhere from 0-75% of meals  - Latest labs WNL 8. LBBB/Supine HTN/light headedness: On metoprolol bid for supine  hypertension  - Mestonin to help with orthostatic changes as florinef was ineffective.   -track bp's with increased activities on rehab  -bp dropping when up with therapy  -teds/abdominal binder, encourage fluids  -continue to acclimate  -vestibular therapy also 9. Insomnia: (Used Xanax for sleep at home). Seroquel is effective.  10. Low protein stores: Added supplement  LOS (Days) 5 A FACE TO FACE EVALUATION WAS PERFORMED  Mandy Durham Mandy Durham 02/02/2015 8:29 AM

## 2015-02-02 NOTE — Progress Notes (Signed)
Occupational Therapy Session Note  Patient Details  Name: Mandy Durham MRN: [id number] Date of Birth: 20-Oct-1945  Today's Date: 02/02/2015 OT Individual Time:  -   0800-0900  (60 min) 1st session      Short Term Goals: Week 1:  OT Short Term Goal 1 (Week 1): STGs = LTGs due to short LOS :     Skilled Therapeutic Interventions/Progress Updates:    1st session:  Pt lying in bed upon OT  Arrival.    Pt agreed to shower.  Needed questioning cues for sequencing and organizing.  Pt. Ambulated with HHA to bathroom.  Incontinent of BM.  Pt was unaware of accident.  Pt transferred >toilet>shower bench.  Bathed and dressed with SBA.  Pt complained of headache 5/10.  Needed to return to bed.  BP:   113/55; 75/  97%,  Informed RN.     Therapy Documentation Precautions:  Precautions Precautions: Fall Restrictions Weight Bearing Restrictions: No     Pain:  HA 5/10  1st and 2nd session       Other Treatments:    Time:  1600-1630  (30 min) 2nd session:  Ambulated to ADL apartment with RW.  Cleaned dishes and made bed with RW and no loss of balance.  Pt took rest break after activities before walking back to room with RW and close supervision.   See Function Navigator for Current Functional Status.   Therapy/Group: Individual Therapy  Lisa Roca 02/02/2015, 8:01 AM

## 2015-02-03 ENCOUNTER — Inpatient Hospital Stay (HOSPITAL_COMMUNITY): Payer: Medicare HMO | Admitting: Occupational Therapy

## 2015-02-03 DIAGNOSIS — E639 Nutritional deficiency, unspecified: Secondary | ICD-10-CM

## 2015-02-03 NOTE — Progress Notes (Signed)
[facility] PHYSICAL MEDICINE & REHABILITATION     PROGRESS NOTE    Subjective/Complaints: Patient seen and examined this a.m. lying in bed. Patient states he slept well overnight. He denies complaints this morning.  ROS:  Denies CP, SOB, N/V/D.  Objective: Vital Signs: Blood pressure 132/60, pulse 52, temperature 97.6 F (36.4 C), temperature source Oral, resp. rate 16, height 5' 6.5" (1.689 m), weight 69.99 kg (154 lb 4.8 oz), SpO2 97 %. No results found. No results for input(s): WBC, HGB, HCT, PLT in the last 72 hours. No results for input(s): NA, K, CL, GLUCOSE, BUN, CREATININE, CALCIUM in the last 72 hours.  Invalid input(s): CO CBG (last 3)  No results for input(s): GLUCAP in the last 72 hours.  Wt Readings from Last 3 Encounters:  01/30/15 69.99 kg (154 lb 4.8 oz)  01/22/15 51.71 kg (114 lb)  09/07/14 69.4 kg (153 lb)    Physical Exam:  Constitutional: She appears well-developed and well-nourished. No distress.  HENT:  Head: Normocephalic.   Eyes: Conjunctivae and EOM are normal.  Neck: Normal range of motion.  Cardiovascular: Normal rate and regular rhythm. +Murmur Respiratory: Effort normal and breath sounds normal. No respiratory distress. She has no wheezes.  GI: Soft. Bowel sounds are normal. She exhibits no distension. There is no tenderness.  Musculoskeletal: She exhibits no edema or tenderness.  Neuro:  Strength b/l UE 4+/5 proximal to distal  B/l LE: 4/5 proximally, 4+/5 distally  She is alert and oriented to person and place   She is able to follow one and two step commands without difficulty.  Skin: Skin is warm and dry. No rash noted. She is not diaphoretic. No erythema.  Psychiatric: Her behavior is normal. Thought content normal.   Assessment/Plan: 1. Functional deficits secondary to TBI which require 3+ hours per day of interdisciplinary therapy in a comprehensive inpatient rehab setting. Physiatrist is providing close team supervision and 24  hour management of active medical problems listed below. Physiatrist and rehab team continue to assess barriers to discharge/monitor patient progress toward functional and medical goals.  Function:  Bathing Bathing position   Position: Shower  Bathing parts Body parts bathed by patient: Right arm, Left arm, Chest, Abdomen, Front perineal area, Buttocks, Right upper leg, Left upper leg, Right lower leg, Left lower leg Body parts bathed by helper: Back  Bathing assist Assist Level: Supervision or verbal cues      Upper Body Dressing/Undressing Upper body dressing   What is the patient wearing?: Pull over shirt/dress     Pull over shirt/dress - Perfomed by patient: Thread/unthread right sleeve, Thread/unthread left sleeve, Put head through opening, Pull shirt over trunk          Upper body assist Assist Level: Supervision or verbal cues      Lower Body Dressing/Undressing Lower body dressing   What is the patient wearing?: Underwear, Pants, Socks Underwear - Performed by patient: Thread/unthread right underwear leg, Thread/unthread left underwear leg, Pull underwear up/down   Pants- Performed by patient: Thread/unthread right pants leg, Thread/unthread left pants leg, Pull pants up/down, Fasten/unfasten pants   Non-skid slipper socks- Performed by patient: Don/doff right sock, Don/doff left sock       Shoes - Performed by patient: Don/doff right shoe, Don/doff left shoe, Fasten right, Fasten left       TED Hose - Performed by patient: Don/doff right TED hose, Don/doff left TED hose TED Hose - Performed by helper: Don/doff right TED hose, Don/doff left TED  hose  Lower body assist Assist for lower body dressing: Supervision or verbal cues      Toileting Toileting   Toileting steps completed by patient: Adjust clothing prior to toileting, Performs perineal hygiene, Adjust clothing after toileting   Toileting Assistive Devices: Grab bar or rail  Toileting assist Assist  level: Supervision or verbal cues   Transfers Chair/bed transfer   Chair/bed transfer method: Ambulatory Chair/bed transfer assist level: Supervision or verbal cues Chair/bed transfer assistive device: Armrests, Medical sales representative     Max distance: 200 ft Assist level: Supervision or verbal cues   Wheelchair          Cognition Comprehension Comprehension assist level: Follows complex conversation/direction with extra time/assistive device  Expression Expression assist level: Expresses basic 75 - 89% of the time/requires cueing 10 - 24% of the time. Needs helper to occlude trach/needs to repeat words.  Social Interaction Social Interaction assist level: Interacts appropriately 90% of the time - Needs monitoring or encouragement for participation or interaction.  Problem Solving Problem solving assist level: Solves basic 75 - 89% of the time/requires cueing 10 - 24% of the time  Memory Memory assist level: Recognizes or recalls 75 - 89% of the time/requires cueing 10 - 24% of the time   Medical Problem List and Plan: 1. Gait, balance, cognitive deficits secondary to traumatic right frontal hemorrhagic contusion, occipital skull fracture and SAH -continue CIR therapies 2. DVT Prophylaxis/Anticoagulation: Mechanical: Sequential compression devices, below knee Bilateral lower extremities and increase mobility/activity level.  3. Pain Management: persistent h/a. Prn vicodin  -continue scheduled hs topamax 12.5mg  (decreased due to slurred speech) 4. Mood: continue Paxil. LCSW to follow for evaluation and support.  5. Neuropsych: This patient is capable of making decisions on her own behalf.  -hs seroquel for sleep/confusion---effective 6. Skin/Wound Care: Routine pressure relief measures. Able to reposition independently 7. Fluids/Electrolytes/Nutrition:   - Eating labile, anywhere from 0-100% of meals  - Latest labs WNL 8. LBBB/Supine HTN/light  headedness: On metoprolol bid for supine hypertension  -Mestonin to help with orthostatic changes as florinef was ineffective.   -track bp's with increased activities on rehab  -bp dropping when up with therapy  -teds/abdominal binder, encourage fluids  -continue to acclimate  -vestibular therapy also  - Improvement 130/70 this AM 9. Insomnia: (Used Xanax for sleep at home). Seroquel is effective.  10. Low protein stores: Added supplement  LOS (Days) 6 A FACE TO FACE EVALUATION WAS PERFORMED  Shauntel Prest [name] 02/03/2015 7:56 AM

## 2015-02-03 NOTE — Progress Notes (Signed)
Occupational Therapy Session Note  Patient Details  Name: Mandy Durham MRN: [id number] Date of Birth: 31-Aug-1945  Today's Date: 02/03/2015 OT Individual Time: 1515-1600 OT Individual Time Calculation (min): 45 min    Short Term Goals: Week 1:  OT Short Term Goal 1 (Week 1): STGs = LTGs due to short LOS      Skilled Therapeutic Interventions/Progress Updates:    Addressed functional mobility, balance in standing, problem solving, visual scanning.  Pt sitting EOB with husband present.  ambullated with RW to day room.  Addressed balance with watering flowers with getting water and carrying to plants.  Engaged in game of connect 4 with pt keeping score with min questioning cues.  Ambulated around day room and pointed out areas around the hospital with directional cues.  Ambulated back to room  Provided suggestions for brain fitness to pt and husband (puzzles, work Secretary/administrator, crossword puzzles, naming westerns on tv, etc.  Pt verbalized need to engage in these activities.  Left with all needs in reach.    Therapy Documentation Precautions:  Precautions Precautions: Fall Restrictions Weight Bearing Restrictions: No General:     Pain:  none   ADL: ADL ADL Comments: Refer to functional navigator        See Function Navigator for Current Functional Status.   Therapy/Group: Individual Therapy  Lisa Roca 02/03/2015, 4:37 PM

## 2015-02-04 ENCOUNTER — Inpatient Hospital Stay (HOSPITAL_COMMUNITY): Payer: Medicare HMO | Admitting: Physical Therapy

## 2015-02-04 ENCOUNTER — Inpatient Hospital Stay (HOSPITAL_COMMUNITY): Payer: Medicare HMO | Admitting: Occupational Therapy

## 2015-02-04 ENCOUNTER — Inpatient Hospital Stay (HOSPITAL_COMMUNITY): Payer: Medicare HMO | Admitting: Speech Pathology

## 2015-02-04 ENCOUNTER — Inpatient Hospital Stay (HOSPITAL_COMMUNITY): Payer: Medicare HMO

## 2015-02-04 NOTE — Progress Notes (Signed)
Physical Therapy Session Note  Patient Details  Name: Mandy Durham MRN: [id number] Date of Birth: 07/14/1945  Today's Date: 02/04/2015 PT Individual Time: 0830-0930 PT Individual Time Calculation (min): 60 min   Short Term Goals: Week 1:  PT Short Term Goal 1 (Week 1): = LTGs due to anticipated LOS  Skilled Therapeutic Interventions/Progress Updates:   Patient seen for 1:1 session focus on balance testing and vestibular rehab.  She ambulated with rollator to therapy gym with S.  Performed Berg balance test, TUG (cog) and 10MWT as noted below.  Check for horizontal canal BPPV with supine head roll, noted no nystagmus either direction, but did not catch up saccades with head turn from midlne to each side and back to midline.  Also patient with plenty of anxiety and hesitancy with sit<>supine.  Check for L post canal with sidelying hall pike with no signs of nystagmus.  Noted seated smooth pursuits and saccades vertical and horizontal WNL as well as VOR horizontal and vertical and VOR cancellation.  Patient ambulated back to room with rollator and supervision.  Left seated edge of bed all needs in reach.  Therapy Documentation Precautions:  Precautions Precautions: Fall Restrictions Weight Bearing Restrictions: No Vital Signs: Therapy Vitals Temp: 97.7 F (36.5 C) Temp Source: Oral Pulse Rate: 83 Resp: 17 BP: 124/60 mmHg Patient Position (if appropriate): Standing Oxygen Therapy SpO2: 99 % O2 Device: Not Delivered Pain: Pain Assessment Pain Assessment: No/denies pain Locomotion : Gait Gait velocity: 10MWT no device .21 m/s; w/rollator .77 m/s   Balance: Berg Balance Test Sit to Stand: Able to stand without using hands and stabilize independently Standing Unsupported: Able to stand safely 2 minutes Sitting with Back Unsupported but Feet Supported on Floor or Stool: Able to sit safely and securely 2 minutes Stand to Sit: Sits safely with minimal use of hands Transfers: Able  to transfer safely, minor use of hands Standing Unsupported with Eyes Closed: Able to stand 10 seconds safely Standing Ubsupported with Feet Together: Able to place feet together independently and stand 1 minute safely From Standing, Reach Forward with Outstretched Arm: Can reach forward >12 cm safely (5") From Standing Position, Pick up Object from Floor: Able to pick up shoe, needs supervision From Standing Position, Turn to Look Behind Over each Shoulder: Looks behind from both sides and weight shifts well Turn 360 Degrees: Able to turn 360 degrees safely but slowly Standing Unsupported, Alternately Place Feet on Step/Stool: Able to stand independently and safely and complete 8 steps in 20 seconds Standing Unsupported, One Foot in Front: Able to plae foot ahead of the other independently and hold 30 seconds Standing on One Leg: Able to lift leg independently and hold equal to or more than 3 seconds Total Score: 49 Timed Up and Go Test Cognitive TUG (seconds): 20.14 (avg of 3 trials multiple errors throughout counting back by 3's)   See Function Navigator for Current Functional Status.   Therapy/Group: Individual Therapy  [place], [place] 02/04/2015  02/04/2015, 9:02 AM

## 2015-02-04 NOTE — Progress Notes (Signed)
Occupational Therapy Session Note  Patient Details  Name: Mandy Durham MRN: [id number] Date of Birth: 1945-12-24  Today's Date: 02/04/2015 OT Individual Time: 1000-1100 OT Individual Time Calculation (min): 60 min    Short Term Goals: No short term goals set  Skilled Therapeutic Interventions/Progress Updates:    Pt seen for skilled OT to facilitate dynamic balance, safe mobility, and pt education with ADL retraining. Pt received in bed stating she felt very good today. She stated she felt safer with the rollator and opted to use that during the session. Pt was able to complete all self care tasks of toileting, shower, shower transfers, dressing, and grooming with supervision.  She had no c/o of dizziness or light headedness. Ambulated with 4WW to ADL apt. Sat down for several minutes to review with PA when to use abdominal binder, TED hose. Even if standing blood pressure is low, only use when symptomatic.  Pt did have low standing BP today but no symptoms.   In apt, pt worked on simple meal prep by opening cupboards, refrigerator, removing and replacing items. She also made up a full sized bed. Pt education on energy conservation and that even though she can accomplish these higher level ADLs with supervision, her family should assist her quite a bit these next few weeks. Spouse not present during session, but was able to meet up with him briefly and reviewed the need for 24/7 S and slowly progressing back into IADLs.  He stated he understood.    Therapy Documentation Precautions:  Precautions Precautions: Fall Restrictions Weight Bearing Restrictions: No    Vital Signs: Therapy Vitals Pulse Rate: 83 BP: 124/60 mmHg Patient Position (if appropriate): Standing Oxygen Therapy SpO2: 99 % O2 Device: Not Delivered Pain: Pain Assessment Pain Assessment: No/denies pain  ADL: ADL ADL Comments: Refer to functional navigator  See Function Navigator for Current Functional  Status.   Therapy/Group: Individual Therapy  [place] 02/04/2015, 12:25 PM

## 2015-02-04 NOTE — Progress Notes (Signed)
Occupational Therapy Discharge Summary  Patient Details  Name: Mandy Durham MRN: [id number] Date of Birth: 15-Feb-1946   Patient has met 10 of 10 long term goals due to improved activity tolerance, improved balance, postural control, ability to compensate for deficits and improved awareness.  Patient to discharge at overall Supervision level.  Patient's care partner is independent to provide the necessary physical and cognitive assistance at discharge.    Reasons goals not met: n/a  Recommendation:  No further OT is recommended at this time.  Equipment: No equipment provided  Reasons for discharge: treatment goals met  Patient/family agrees with progress made and goals achieved: Yes  OT Discharge Precautions/Restrictions  Precautions Precautions: Fall Restrictions Weight Bearing Restrictions: No   Vital Signs Therapy Vitals Pulse Rate: 83 BP: 124/60 mmHg Patient Position (if appropriate): Standing Oxygen Therapy SpO2: 99 % O2 Device: Not Delivered Pain Pain Assessment Pain Assessment: No/denies pain Pain Score: 2  ADL ADL ADL Comments: Refer to functional navigator Vision/Perception  Vision- History Baseline Vision/History: Wears glasses Wears Glasses: At all times Patient Visual Report: No change from baseline Vision- Assessment Vision Assessment?: No apparent visual deficits  Cognition Overall Cognitive Status: Within Functional Limits for tasks assessed Arousal/Alertness: Awake/alert Orientation Level: Oriented X4 Attention: Sustained Sustained Attention: Appears intact Memory: Impaired Memory Impairment: Decreased recall of new information;Decreased short term memory Decreased Short Term Memory: Verbal complex Awareness: Appears intact Problem Solving: Impaired Problem Solving Impairment: Functional complex Rancho [employer] Scales of Cognitive Functioning: Purposeful/appropriate Sensation Sensation Light Touch: Appears Intact Stereognosis: Appears  Intact Hot/Cold: Appears Intact Proprioception: Appears Intact Coordination Gross Motor Movements are Fluid and Coordinated: Yes Fine Motor Movements are Fluid and Coordinated: Yes Coordination and Movement Description: movement more coordinated and confident when using rollator Finger Nose Finger Test: normal Heel Shin Test: R=L, decreased ROM Motor  Motor Motor: Within Functional Limits Mobility  Bed Mobility Bed Mobility: Rolling Right;Rolling Left;Supine to Sit;Sit to Supine Rolling Right: 6: Modified independent (Device/Increase time) Rolling Left: 6: Modified independent (Device/Increase time) Supine to Sit: 6: Modified independent (Device/Increase time) Sit to Supine: 6: Modified independent (Device/Increase time) Transfers Sit to Stand: 6: Modified independent (Device/Increase time) Stand to Sit: 6: Modified independent (Device/Increase time)  Trunk/Postural Assessment  Cervical Assessment Cervical Assessment: Within Functional Limits Thoracic Assessment Thoracic Assessment: Within Functional Limits Lumbar Assessment Lumbar Assessment: Within Functional Limits Postural Control Protective Responses: minimal delays without use of AD of 4WW  Balance Berg Balance Test Sit to Stand: Able to stand without using hands and stabilize independently Standing Unsupported: Able to stand safely 2 minutes Sitting with Back Unsupported but Feet Supported on Floor or Stool: Able to sit safely and securely 2 minutes Stand to Sit: Sits safely with minimal use of hands Transfers: Able to transfer safely, minor use of hands Standing Unsupported with Eyes Closed: Able to stand 10 seconds safely Standing Ubsupported with Feet Together: Able to place feet together independently and stand 1 minute safely From Standing, Reach Forward with Outstretched Arm: Can reach forward >12 cm safely (5") From Standing Position, Pick up Object from Floor: Able to pick up shoe, needs supervision From  Standing Position, Turn to Look Behind Over each Shoulder: Looks behind from both sides and weight shifts well Turn 360 Degrees: Able to turn 360 degrees safely but slowly Standing Unsupported, Alternately Place Feet on Step/Stool: Able to stand independently and safely and complete 8 steps in 20 seconds Standing Unsupported, One Foot in Front: Able to plae foot ahead of the  other independently and hold 30 seconds Standing on One Leg: Able to lift leg independently and hold equal to or more than 3 seconds Total Score: 49 Timed Up and Go Test Cognitive TUG (seconds): 20.14 (avg of 3 trials multiple errors throughout counting back by 3's) Dynamic Sitting Balance Dynamic Sitting - Balance Support: No upper extremity supported;Feet unsupported Dynamic Sitting - Level of Assistance: 6: Modified independent (Device/Increase time) Dynamic Sitting - Balance Activities: Lateral lean/weight shifting;Forward lean/weight shifting;Reaching for objects Static Standing Balance Static Standing - Balance Support: No upper extremity supported Static Standing - Level of Assistance: 6: Modified independent (Device/Increase time) Dynamic Standing Balance Dynamic Standing - Balance Support: No upper extremity supported Dynamic Standing - Level of Assistance: 5: Stand by assistance Dynamic Standing - Balance Activities: Forward lean/weight shifting;Lateral lean/weight shifting;Reaching for objects;Reaching across midline Extremity/Trunk Assessment RUE Assessment RUE Assessment: Within Functional Limits LUE Assessment LUE Assessment: Within Functional Limits   See Function Navigator for Current Functional Status.  [place] 02/04/2015, 12:42 PM

## 2015-02-04 NOTE — Progress Notes (Signed)
Speech Language Pathology Session Note & Discharge Summary  Patient Details  Name: Shamyah EMRYN FLANERY MRN: [id number] Date of Birth: 1945-03-21  Today's Date: 02/04/2015 SLP Individual Time: 1330-1430 SLP Individual Time Calculation (min): 60 min   Skilled Therapeutic Interventions:  Skilled treatment session focused on cognitive goals and patient/family education. Patient was administered the MoCA and scored 21/30 points with a score of 26 or above considered normal with deficits in the areas of recall and organization. Patient also completed a mildly complex money management task with Mod I and 100% accuracy. The patient and her husband were educated in regards to strategies to utilize at home to maximize recall of new information and the importance of 24 hour supervision, especially in regards to money and medication management, both verbalized understanding. Patient will discharge home tomorrow with family. Patient left upright while sitting on the EOB with husband present. Continue with current plan of care.     Patient has met 3 of 3 long term goals.  Patient to discharge at overall Supervision level.   Reasons goals not met: N/A   Clinical Impression/Discharge Summary: Patient has made functional gains and has met 3 of 3 LTG's this admission due to increased cognitive functioning. Currently, patient is demonstrating behaviors consistent with a Rancho Level VIII and requires overall supervision verbal cues for functional problem solving with mildly complex, familiar tasks, recall of new information with use of compensatory strategies and emergent awareness of errors. Patient and family education is complete and patient will discharge home with 24 hour supervision from family. Patient would benefit from f/u SLP services to maximize cognitive function and overall functional independence  in order to reduce caregiver burden.   Care Partner:  Caregiver Able to Provide Assistance: Yes  Type of  Caregiver Assistance: Physical;Cognitive  Recommendation:  24 hour supervision/assistance;Outpatient SLP  Rationale for SLP Follow Up: Maximize cognitive function and independence;Reduce caregiver burden   Equipment: N/A   Reasons for discharge: Treatment goals met;Discharged from hospital   Patient/Family Agrees with Progress Made and Goals Achieved: Yes   Function:  Cognition Comprehension Comprehension assist level: Follows basic conversation/direction with no assist  Expression   Expression assist level: Expresses basic needs/ideas: With extra time/assistive device  Social Interaction Social Interaction assist level: Interacts appropriately with others with medication or extra time (anti-anxiety, antidepressant).  Problem Solving Problem solving assist level: Solves basic 90% of the time/requires cueing < 10% of the time  Memory Memory assist level: Recognizes or recalls 90% of the time/requires cueing < 10% of the time   Corine Solorio 02/04/2015, 4:11 PM

## 2015-02-04 NOTE — Plan of Care (Signed)
Problem: RH Floor Transfers Goal: LTG Patient will perform floor transfers w/assist (PT) LTG: Patient will perform floor transfers with assistance (PT).  Outcome: Not Met (add Reason) Not assessed

## 2015-02-04 NOTE — Progress Notes (Signed)
Recreational Therapy Session Note  Patient Details  Name: Mandy Durham MRN: [id number] Date of Birth: 10-18-1945 Today's Date: 02/04/2015  Pain: no c/o Skilled Therapeutic Interventions/Progress Updates: Session focused on discharge planning with specific focus on use of leisure time and community integration.  Performed activity analysis to determine potential modifications and/or adaptations.  Also introduced pt to new word game that she is interested in trying.  Pt is anxious to return home and to participate in her previously enjoyed leisure activities.   Therapy/Group: Individual Therapy   Acen Craun 02/04/2015, 2:45 PM

## 2015-02-04 NOTE — Progress Notes (Signed)
Social Work  Discharge Note  The overall goal for the admission was met for:   Discharge location: Yes - home with husband to provide 24/7 supervision  Length of Stay: Yes - 8 days  Discharge activity level: Yes - supervision  Home/community participation: Yes  Services provided included: MD, RD, PT, OT, SLP, RN, TR, Pharmacy and [place]: Private Insurance: [employer]  Follow-up services arranged: Outpatient: PT, ST via [facility], DME: rollator walker, tub seat without back via [place] and Patient/Family has no preference for HH/DME agencies  Comments (or additional information):  Patient/Family verbalized understanding of follow-up arrangements: Yes  Individual responsible for coordination of the follow-up plan: pt/spouse  Confirmed correct DME delivered: Krist Rosenboom 02/04/2015    [place], [place]

## 2015-02-04 NOTE — Progress Notes (Signed)
[facility]ILIT[facility]     PROGRESS NOTE    Subjective/Complaints: Had a good night. Has a list of questions she wanted to ask me. Most of them pertained to her BP. bp still low with activity.  ROS:  Denies CP, SOB, N/V/D, h/a, insomnia, anxiety  Objective: Vital Signs: Blood pressure 144/61, pulse 57, temperature 97.7 F (36.5 C), temperature source Oral, resp. rate 17, height 5' 6.5" (1.689 m), weight 69.99 kg (154 lb 4.8 oz), SpO2 97 %. No results found. No results for input(s): WBC, HGB, HCT, PLT in the last 72 hours. No results for input(s): NA, K, CL, GLUCOSE, BUN, CREATININE, CALCIUM in the last 72 hours.  Invalid input(s): CO CBG (last 3)  No results for input(s): GLUCAP in the last 72 hours.  Wt Readings from Last 3 Encounters:  01/30/15 69.99 kg (154 lb 4.8 oz)  01/22/15 51.71 kg (114 lb)  09/07/14 69.4 kg (153 lb)    Physical Exam:  Constitutional: She appears well-developed and well-nourished. No distress.  HENT:  Head: Normocephalic.   Eyes: Conjunctivae and EOM are normal.  Neck: Normal range of motion.  Cardiovascular: Normal rate and regular rhythm. +Murmur Respiratory: Effort normal and breath sounds normal. No respiratory distress. She has no wheezes.  GI: Soft. Bowel sounds are normal. She exhibits no distension. There is no tenderness.  Musculoskeletal: She exhibits no edema or tenderness.  Neuro:  Strength b/l UE 4+/5 proximal to distal  B/l LE: 4/5 proximally, 4+/5 distally  She is alert and oriented to person and place   She is able to follow one and two step commands without difficulty.  Skin: Skin is warm and dry. No rash noted. She is not diaphoretic. No erythema.  Psychiatric: Her behavior is normal. Thought content normal.   Assessment/Plan: 1. Functional deficits secondary to TBI which require 3+ hours per day of interdisciplinary therapy in a comprehensive inpatient rehab setting. Physiatrist is providing close  team supervision and 24 hour management of active medical problems listed below. Physiatrist and rehab team continue to assess barriers to discharge/monitor patient progress toward functional and medical goals.  Function:  Bathing Bathing position   Position: Shower  Bathing parts Body parts bathed by patient: Right arm, Left arm, Chest, Abdomen, Front perineal area, Buttocks, Right upper leg, Left upper leg, Right lower leg, Left lower leg Body parts bathed by helper: Back  Bathing assist Assist Level: Supervision or verbal cues      Upper Body Dressing/Undressing Upper body dressing   What is the patient wearing?: Pull over shirt/dress     Pull over shirt/dress - Perfomed by patient: Thread/unthread right sleeve, Thread/unthread left sleeve, Put head through opening, Pull shirt over trunk          Upper body assist Assist Level: Supervision or verbal cues      Lower Body Dressing/Undressing Lower body dressing   What is the patient wearing?: Underwear, Pants, Socks Underwear - Performed by patient: Thread/unthread right underwear leg, Thread/unthread left underwear leg, Pull underwear up/down   Pants- Performed by patient: Thread/unthread right pants leg, Thread/unthread left pants leg, Pull pants up/down, Fasten/unfasten pants   Non-skid slipper socks- Performed by patient: Don/doff right sock, Don/doff left sock       Shoes - Performed by patient: Don/doff right shoe, Don/doff left shoe, Fasten right, Fasten left       TED Hose - Performed by patient: Don/doff right TED hose, Don/doff left TED hose TED Hose - Performed  by helper: Don/doff right TED hose, Don/doff left TED hose  Lower body assist Assist for lower body dressing: Supervision or verbal cues      Toileting Toileting   Toileting steps completed by patient: Adjust clothing prior to toileting, Performs perineal hygiene, Adjust clothing after toileting   Toileting Assistive Devices: Grab bar or rail   Toileting assist Assist level: Supervision or verbal cues   Transfers Chair/bed transfer   Chair/bed transfer method: Ambulatory Chair/bed transfer assist level: Supervision or verbal cues Chair/bed transfer assistive device: Armrests, Medical sales representative     Max distance: 400 ft Assist level: Supervision or verbal cues   Wheelchair          Cognition Comprehension Comprehension assist level: Follows complex conversation/direction with extra time/assistive device  Expression Expression assist level: Expresses basic 75 - 89% of the time/requires cueing 10 - 24% of the time. Needs helper to occlude trach/needs to repeat words.  Social Interaction Social Interaction assist level: Interacts appropriately 90% of the time - Needs monitoring or encouragement for participation or interaction.  Problem Solving Problem solving assist level: Solves basic 75 - 89% of the time/requires cueing 10 - 24% of the time  Memory Memory assist level: Recognizes or recalls 75 - 89% of the time/requires cueing 10 - 24% of the time   Medical Problem List and Plan: 1. Gait, balance, cognitive deficits secondary to traumatic right frontal hemorrhagic contusion, occipital skull fracture and SAH -continue CIR therapies 2. DVT Prophylaxis/Anticoagulation: Mechanical: Sequential compression devices, below knee Bilateral lower extremities and increase mobility/activity level.  3. Pain Management: persistent h/a. Prn vicodin  -continue scheduled hs topamax 12.5mg   4. Mood: continue Paxil. LCSW to follow for evaluation and support.  5. Neuropsych: This patient is capable of making decisions on her own behalf.  -hs seroquel for sleep/confusion---effective 6. Skin/Wound Care: Routine pressure relief measures. Able to reposition independently 7. Fluids/Electrolytes/Nutrition:   -intake generally good 8. LBBB/Supine HTN/light headedness: On metoprolol bid for supine  hypertension  -Mestonin to help with orthostatic changes as florinef was ineffective.   -teds/abdominal binder, encourage fluids  -continue to acclimate  -vestibular therapy also  -discussed with her at length that this will need ongoing attention as this was an issue PTA. She will need to use all faculties at her disposal including better awareness to deal with it.  9. Insomnia: (Used Xanax for sleep at home). Seroquel is effective.  10. Low protein stores: Added supplement  LOS (Days) 7 A FACE TO FACE EVALUATION WAS PERFORMED  SWARTZ,ZACHARY T 02/04/2015 8:28 AM

## 2015-02-05 ENCOUNTER — Other Ambulatory Visit: Payer: Self-pay | Admitting: Internal Medicine

## 2015-02-05 DIAGNOSIS — G44309 Post-traumatic headache, unspecified, not intractable: Secondary | ICD-10-CM | POA: Diagnosis present

## 2015-02-05 MED ORDER — TOPIRAMATE 25 MG PO TABS: 12.5000 mg | ORAL_TABLET | Freq: Every day | ORAL | Status: DC

## 2015-02-05 MED ORDER — ACETAMINOPHEN 325 MG PO TABS: 650.0000 mg | ORAL_TABLET | Freq: Two times a day (BID) | ORAL | Status: DC

## 2015-02-05 MED ORDER — SENNOSIDES-DOCUSATE SODIUM 8.6-50 MG PO TABS: 1.0000 | ORAL_TABLET | Freq: Every day | ORAL | Status: DC

## 2015-02-05 MED ORDER — QUETIAPINE FUMARATE 25 MG PO TABS: 12.5000 mg | ORAL_TABLET | Freq: Every day | ORAL | Status: DC

## 2015-02-05 NOTE — Discharge Instructions (Signed)
Inpatient Rehab Discharge Instructions  Mandy Durham Discharge date and time:  02/05/15  Activities/Precautions/ Functional Status: Activity: no lifting, driving, or strenuous exercise till cleared by MD.  Diet: regular diet Wound Care: none needed   Functional status:  ___ No restrictions     ___ Walk up steps independently ___ 24/7 supervision/assistance   ___ Walk up steps with assistance _X__ Intermittent supervision/assistance  ___ Bathe/dress independently ___ Walk with walker     _X__ Bathe/dress with supervision. ___ Walk Independently    ___ Shower independently ___ Walk with assistance    ___ Shower with assistance _X__ No alcohol     ___ Return to work/school ________   COMMUNITY REFERRALS UPON DISCHARGE:    Outpatient: PT       ST                             Agency: [facility] Phone: [phone]              Appointment Date/Time: 11/28 for Physical therapy @ 9:30 am (arrive 9:00 am)                                                                12/13 for Speech therapy @ 9:00 am  Medical Equipment/Items Ordered:  rollator walker and tub seat                                                      Agency/Supplier:  [facility] @ [phone]   GENERAL COMMUNITY RESOURCES FOR PATIENT/FAMILY:  Support Groups:  Brain Injury Support Group     Special Instructions: 1. Drink plenty of fluids.  2. Wear support stockings during the day.  Wear binder if feeling dizzy or having a bad day due to low blood pressure. Use binder when up and walking--take binder off when sitting or in bed. 3. Check blood pressure twice a day--in morning and in the afternoon.     My questions have been answered and I understand these instructions. I will adhere to these goals and the provided educational materials after my discharge from the hospital.  Patient/Caregiver Signature _______________________________ Date __________  Clinician Signature  _______________________________________ Date __________  Please bring this form and your medication list with you to all your follow-up doctor's appointments.

## 2015-02-05 NOTE — Progress Notes (Signed)
[facility]     PROGRESS NOTE    Subjective/Complaints: Feeling well. Excited to go home. Feels prepared.   ROS:  Denies CP, SOB, N/V/D, h/a, insomnia, anxiety  Objective: Vital Signs: Blood pressure 127/62, pulse 68, temperature 98.1 F (36.7 C), temperature source Oral, resp. rate 17, height 5' 6.5" (1.689 m), weight 69.99 kg (154 lb 4.8 oz), SpO2 99 %. No results found. No results for input(s): WBC, HGB, HCT, PLT in the last 72 hours. No results for input(s): NA, K, CL, GLUCOSE, BUN, CREATININE, CALCIUM in the last 72 hours.  Invalid input(s): CO CBG (last 3)  No results for input(s): GLUCAP in the last 72 hours.  Wt Readings from Last 3 Encounters:  01/30/15 69.99 kg (154 lb 4.8 oz)  01/22/15 51.71 kg (114 lb)  09/07/14 69.4 kg (153 lb)    Physical Exam:  Constitutional: She appears well-developed and well-nourished. No distress.  HENT:  Head: Normocephalic.   Eyes: Conjunctivae and EOM are normal.  Neck: Normal range of motion.  Cardiovascular: Normal rate and regular rhythm. +Murmur Respiratory: Effort normal and breath sounds normal. No respiratory distress. She has no wheezes.  GI: Soft. Bowel sounds are normal. She exhibits no distension. There is no tenderness.  Musculoskeletal: She exhibits no edema or tenderness.  Neuro: speech still stuttering at times Strength b/l UE 4+/5 proximal to distal  B/l LE: 4/5 proximally, 4+/5 distally  She is alert and oriented to person and place   She is able to follow one and two step commands without difficulty.  Skin: Skin is warm and dry. No rash noted. She is not diaphoretic. No erythema.  Psychiatric: Her behavior is normal. Thought content normal.   Assessment/Plan: 1. Functional deficits secondary to TBI which require 3+ hours per day of interdisciplinary therapy in a comprehensive inpatient rehab setting. Physiatrist is providing close team supervision and 24 hour management of  active medical problems listed below. Physiatrist and rehab team continue to assess barriers to discharge/monitor patient progress toward functional and medical goals.  Function:  Bathing Bathing position   Position: Shower  Bathing parts Body parts bathed by patient: Right arm, Left arm, Chest, Abdomen, Front perineal area, Buttocks, Right upper leg, Left upper leg, Right lower leg, Left lower leg, Back Body parts bathed by helper: Back  Bathing assist Assist Level: Supervision or verbal cues      Upper Body Dressing/Undressing Upper body dressing   What is the patient wearing?: Pull over shirt/dress     Pull over shirt/dress - Perfomed by patient: Thread/unthread right sleeve, Thread/unthread left sleeve, Put head through opening, Pull shirt over trunk          Upper body assist Assist Level: Set up      Lower Body Dressing/Undressing Lower body dressing   What is the patient wearing?: Underwear, Pants, Socks, Shoes Underwear - Performed by patient: Thread/unthread right underwear leg, Thread/unthread left underwear leg, Pull underwear up/down   Pants- Performed by patient: Thread/unthread right pants leg, Thread/unthread left pants leg, Pull pants up/down, Fasten/unfasten pants   Non-skid slipper socks- Performed by patient: Don/doff right sock, Don/doff left sock   Socks - Performed by patient: Don/doff right sock, Don/doff left sock   Shoes - Performed by patient: Don/doff right shoe, Don/doff left shoe, Fasten right, Fasten left       TED Hose - Performed by patient: Don/doff right TED hose, Don/doff left TED hose TED Hose - Performed by helper: Don/doff right  TED hose, Don/doff left TED hose  Lower body assist Assist for lower body dressing: Supervision or verbal cues      Toileting Toileting   Toileting steps completed by patient: Adjust clothing prior to toileting, Performs perineal hygiene, Adjust clothing after toileting   Toileting Assistive Devices: Grab  bar or rail  Toileting assist Assist level: Supervision or verbal cues   Transfers Chair/bed transfer   Chair/bed transfer method: Stand pivot, Ambulatory Chair/bed transfer assist level: No Help, no cues, assistive device, takes more than a reasonable amount of time Chair/bed transfer assistive device: Armrests (rollator)     Locomotion Ambulation     Max distance: 180 Assist level: Supervision or verbal cues   Wheelchair          Cognition Comprehension Comprehension assist level: Follows basic conversation/direction with no assist  Expression Expression assist level: Expresses basic needs/ideas: With extra time/assistive device  Social Interaction Social Interaction assist level: Interacts appropriately 50 - 74% of the time - May be physically or verbally inappropriate.  Problem Solving Problem solving assist level: Solves basic 90% of the time/requires cueing < 10% of the time  Memory Memory assist level: Recognizes or recalls 90% of the time/requires cueing < 10% of the time   Medical Problem List and Plan: 1. Gait, balance, cognitive deficits secondary to traumatic right frontal hemorrhagic contusion, occipital skull fracture and SAH -rehab goals met. Dc home today  -follow up with pcp,cards, me in office  -outpatient therapies 2. DVT Prophylaxis/Anticoagulation: Mechanical: Sequential compression devices, below knee Bilateral lower extremities and increase mobility/activity level.  3. Pain Management: persistent h/a. Prn vicodin  -continue scheduled hs topamax 12.$RemoveBefor'5mg'[id number]$ ---continue follow up with me 4. Mood: continue Paxil. LCSW to follow for evaluation and support.  5. Neuropsych: This patient is capable of making decisions on her own behalf.  -hs seroquel for sleep/confusion---effective 6. Skin/Wound Care: Routine pressure relief measures. Able to reposition independently 7. Fluids/Electrolytes/Nutrition:   -intake generally good 8. LBBB/Supine HTN/light  headedness: On metoprolol bid for supine hypertension  -Mestonin to help with orthostatic changes as florinef was ineffective.   -teds/abdominal binder, encourage fluids  -vestibular therapy also  -reviewed ongoing treatment and observation once home 9. Insomnia:  Seroquel is effective. Can dc home on this---will re-evaluate need at follow up visit with me 10. Low protein stores: Added supplement  LOS (Days) 8 A FACE TO FACE EVALUATION WAS PERFORMED  Kavita Bartl T 02/05/2015 8:18 AM

## 2015-02-05 NOTE — Telephone Encounter (Signed)
Pt is requesting refill on Alprazolam. Pt has been in hospital from fall/syncope, skull Fx and brain hemorrhage. Pt has hospital F/U on 02/12/2015.  Last OV: 09/07/2014 Last Fill: 11/20/2014 #60 and 1RF Pt can take 1-2 tablets qhs PRN.  UDS: None  Please advise.

## 2015-02-05 NOTE — Telephone Encounter (Signed)
Xanax is in her medication list at the time of discharge, okay to rx #30, no refills

## 2015-02-05 NOTE — Progress Notes (Signed)
Pt. Got d/c instructions and follow up appointments.Pt. Ready to go home with her husband.

## 2015-02-05 NOTE — Telephone Encounter (Signed)
Rx printed, awaiting MD signature.  

## 2015-02-05 NOTE — Telephone Encounter (Signed)
Rx faxed to [facility]  

## 2015-02-05 NOTE — Discharge Summary (Signed)
Physician Discharge Summary  Patient ID: Mandy Durham MRN: [id number] DOB/AGE: [birth year] 69 y.o.  Admit date: 01/28/2015 Discharge date: 02/05/2015  Discharge Diagnoses:  Principal Problem:   TBI (traumatic brain injury) ([place]) Active Problems:   INSOMNIA, CHRONIC, MILD   Vertigo due to brain injury [facility])   Essential hypertension   Acute post-traumatic headache, not intractable   Headache due to trauma   Discharged Condition: Stable    Labs:  Basic Metabolic Panel: BMP Latest Ref Rng 01/29/2015 01/26/2015 01/25/2015  Glucose 65 - 99 mg/dL 108(H) 101(H) 101(H)  BUN 6 - 20 mg/dL 15 14 11   Creatinine 0.44 - 1.00 mg/dL 0.88 0.73 0.61  Sodium 135 - 145 mmol/L 137 139 138  Potassium 3.5 - 5.1 mmol/L 4.0 3.9 3.3(L)  Chloride 101 - 111 mmol/L 100(L) 102 106  CO2 22 - 32 mmol/L 29 28 24   Calcium 8.9 - 10.3 mg/dL 9.1 8.9 8.6(L)     CBC: CBC Latest Ref Rng 01/29/2015 01/22/2015 01/21/2015  WBC 4.0 - 10.5 K/uL 9.4 11.1(H) 13.2(H)  Hemoglobin 12.0 - 15.0 g/dL 13.4 13.1 12.8  Hematocrit 36.0 - 46.0 % 40.6 39.5 39.0  Platelets 150 - 400 K/uL 386 307 304     CBG: No results for input(s): GLUCAP in the last 168 hours.  Brief HPI:   Mandy Durham is a 69 y.o. right handed female with history of HTN, anxiety, LBBB, syncope and presyncope who was admitted on 01/21/2015 after striking her head due to fall with amnesia of events and complaints of HA.She was evaluated in ED and CT of the head revealed inferior right frontal hemorrhagic contusion, non-depressed occipital skull fracture, and moderate SAH around R-sylvian fissure. CTA head negative for aneurysm, occlusion or stenosis. Dr. Saintclair Halsted evaluated patient and recommended conservative management. Serial CCT done and showed increase in edema and size of right frontal hematoma and she was treated with short course of steroids.  Seroquel was added to help with sleep and confusion likely due to steriods.  She continued to have unsteady gait  with nausea and vertigo due to BPPV. CIR recommended by MD and rehab team.    Hospital Course: Mandy Durham was admitted to rehab 01/28/2015 for inpatient therapies to consist of PT, ST and OT at least three hours five days a week. Past admission physiatrist, therapy team and rehab RN have worked together to provide customized collaborative inpatient rehab. Blood pressures were monitored on bid basis. She has had supine hypertension and continues to have drop in BP with standing at times. She was encouraged to increase po fluid intake and  Thigh high TEDs and abdominal binder were ordered to help with symptoms.  Dizziness and nausea due to vestibular symptoms have has resolved almost resolved with vestibular rehab. Topamax was added at bed time to help with headaches and Seroquel was decreased to 0.25 to decrease sedative effects during the day. This has been effective for management of insomnia as well as pain management.  Po intake has been good and she is continent of bowel and bladder.  Labs at admission showed hypokalemia has resolved and H/H is stable.  She has made great progress and is currently demonstrating behaviors consistent with RLAS VIII.  Supervision is recommended at discharge due to cognitive and balance issues. She continue to receive follow up Outpatient PT and ST as [facility] after discharge.    Rehab course: During patient's stay in rehab weekly team conferences were held to monitor patient's progress, set  goals and discuss barriers to discharge. At admission, patient required min assist with mobility and basic self care tasks. She had deficits in problem solving, recall and organization with mildly complex and familiar tasks. She has had improvement in activity tolerance, balance, postural control, as well as ability to compensate for deficits. She requires supervision for ambulation. She is modified independent for transfers, is able to walk 200' with rollater/ supervision and  requires min assist with stairs. She is able to complete ADL tasks with supervision. BERG balance score is 49/56 at discharge. She requires supervision for functional problem solving with mildly complex and familiar tasks, for recall of new information, for use of compensatory strategies and for emergent awareness of errors.  MoCA score has improved to 21/30.  Family education was done with husband regarding all aspects of care and safety. Husband to provide 24 hours supervision after discharge.     Disposition: Home  Diet: Regular  Special Instructions: 1. No driving till cleared by MD.  2. Drink plenty of fluids. 3. Check blood pressure twice a day and follow up with cardiology for further input.  4. Needs to have am cortisol levels rechecked.        Discharge Instructions    Ambulatory referral to Physical Medicine Rehab    Complete by:  As directed   Follow up with Zach in 4 weeks for TBI            Medication List    STOP taking these medications        ALPRAZolam 0.25 MG tablet  Commonly known as:  XANAX      TAKE these medications        acetaminophen 325 MG tablet  Commonly known as:  TYLENOL  Take 2 tablets (650 mg total) by mouth 2 (two) times daily with breakfast and lunch. To help manage headaches during the day.     calcium carbonate 600 MG Tabs tablet  Commonly known as:  OS-CAL  Take 1,200 mg by mouth daily with breakfast.     cholecalciferol 1000 UNITS tablet  Commonly known as:  VITAMIN D  Take 2,000 Units by mouth daily.     hydrocortisone 2.5 % cream  Apply topically 2 (two) times daily.     metoprolol tartrate 25 MG tablet  Commonly known as:  LOPRESSOR  Take 1 tablet (25 mg total) by mouth 2 (two) times daily.     PARoxetine 10 MG tablet  Commonly known as:  PAXIL  Take 10 mg by mouth daily.     pyridostigmine 60 MG tablet  Commonly known as:  MESTINON  Take 1 tablet (60 mg total) by mouth 3 (three) times daily.     QUEtiapine 25 MG  tablet  Commonly known as:  SEROQUEL  Take 0.5 tablets (12.5 mg total) by mouth at bedtime.     senna-docusate 8.6-50 MG tablet  Commonly known as:  Senokot-S  Take 1 tablet by mouth at bedtime. As needed for constipation     topiramate 25 MG tablet  Commonly known as:  TOPAMAX  Take 0.5 tablets (12.5 mg total) by mouth at bedtime. To help manage headaches.       Follow-up Information    Follow up with Meredith Staggers, MD.   Specialty:  Physical Medicine and Rehabilitation   Why:  office to call you with follow up appointment   Contact information:   [address], [place] [address] [phone]  Call Elaina Hoops, MD.   Specialty:  Neurosurgery   Why:  for follow up appointment   Contact information:   [address] [place] [address] [phone]       Follow up with Kathlene November, MD On 02/12/2015.   Specialty:  Internal Medicine   Why:  @ 11:30 am   Contact information:   [place] [address] [phone]       Follow up with Loralie Champagne, MD. Call today.   Specialty:  Cardiology   Why:  for follow up appointment if blood pressure issues continue.    Contact information:   [id number] [place] [address] [phone]       Signed: Bary Leriche 02/05/2015, 5:25 PM

## 2015-02-06 ENCOUNTER — Telehealth: Payer: Self-pay | Admitting: Behavioral Health

## 2015-02-06 ENCOUNTER — Telehealth: Payer: Self-pay | Admitting: Internal Medicine

## 2015-02-06 NOTE — Telephone Encounter (Signed)
Transition Care Management Follow-up Telephone Call   Date discharged? 02/05/15   How have you been since you were released from the hospital? Patient voiced that she's still moving very slow and taking it easy.   Do you understand why you were in the hospital? yes, patient stated, "I had fallen and hit my head".   Do you understand the discharge instructions? yes   Where were you discharged to? Home with husband   Items Reviewed:  Medications reviewed: yes  Allergies reviewed: yes, patient does not have any known allergies  Dietary changes reviewed: no, patient reports having a regular diet  Referrals reviewed: yes, patient verbalized that on next Tuesday she will go to outpatient therapy.   Functional Questionnaire:   Activities of Daily Living (ADLs):   She states they are independent in the following: ambulation, bathing and hygiene, feeding, continence, grooming, toileting and dressing States they require assistance with the following: None   Any transportation issues/concerns?: no   Any patient concerns? no   Confirmed importance and date/time of follow-up visits scheduled yes, 02/12/15 at 11:30 AM  Provider Appointment booked with Dr. Larose Kells  Confirmed with patient if condition begins to worsen call PCP or go to the ER.  Patient was given the office number and encouraged to call back with question or concerns.  : yes

## 2015-02-06 NOTE — Telephone Encounter (Signed)
Caller name: Maleigh  Relationship to patient: Self   Can be reached: [phone]  Pharmacy: [facility] [place] - [place]  Reason for call: Pt is requesting a refill on her ALPRAZolam Rx.

## 2015-02-06 NOTE — Telephone Encounter (Signed)
LVM informing pt of the below message.    Thanks.

## 2015-02-06 NOTE — Telephone Encounter (Signed)
Rx was faxed to [facility] on 02/05/2015.

## 2015-02-11 ENCOUNTER — Ambulatory Visit
Payer: Medicare HMO | Attending: Physical Medicine & Rehabilitation | Admitting: Rehabilitative and Restorative Service Providers"

## 2015-02-11 DIAGNOSIS — M6281 Muscle weakness (generalized): Secondary | ICD-10-CM | POA: Diagnosis present

## 2015-02-11 DIAGNOSIS — R269 Unspecified abnormalities of gait and mobility: Secondary | ICD-10-CM

## 2015-02-12 ENCOUNTER — Ambulatory Visit (INDEPENDENT_AMBULATORY_CARE_PROVIDER_SITE_OTHER): Payer: Medicare HMO | Admitting: Internal Medicine

## 2015-02-12 ENCOUNTER — Encounter: Payer: Self-pay | Admitting: Internal Medicine

## 2015-02-12 VITALS — BP 122/62 | HR 58 | Temp 97.5°F | Ht 67.0 in | Wt 144.4 lb

## 2015-02-12 DIAGNOSIS — R112 Nausea with vomiting, unspecified: Secondary | ICD-10-CM

## 2015-02-12 DIAGNOSIS — R55 Syncope and collapse: Secondary | ICD-10-CM | POA: Diagnosis not present

## 2015-02-12 DIAGNOSIS — I609 Nontraumatic subarachnoid hemorrhage, unspecified: Secondary | ICD-10-CM | POA: Diagnosis not present

## 2015-02-12 DIAGNOSIS — Z09 Encounter for follow-up examination after completed treatment for conditions other than malignant neoplasm: Secondary | ICD-10-CM

## 2015-02-12 MED ORDER — ALPRAZOLAM 0.25 MG PO TABS: 0.2500 mg | ORAL_TABLET | Freq: Every evening | ORAL | Status: DC | PRN

## 2015-02-12 MED ORDER — ONDANSETRON HCL 4 MG PO TABS: 4.0000 mg | ORAL_TABLET | Freq: Three times a day (TID) | ORAL | Status: DC | PRN

## 2015-02-12 NOTE — Progress Notes (Addendum)
Subjective:    Patient ID: Mandy Durham, female    DOB: 08-08-1945, 69 y.o.   MRN: [id number]  DOS:  02/12/2015  Type of visit - description : Hospital follow-up here with her has been Interval history: Admitted to hospital 01/21/2015 for a week. Had a syncope and developed  a traumatic SAH, saw neurosurgery, no surgery required. CT also showed a non depressed occipital fracture Had intractable nausea felt to be part of the concussion. Tried  Steroids >>> induced delirium. Workup included a echocardiogram without acute changes, carotid ultrasound with no evidence of stenosis. Cards was not consulted . ? Adrenal insufficiency Was sent to rehabilitation, discharge 02/05/2015, s/p PT, ST and OT.  Review of Systems Currently back at home and  she is doing well. Denies chest pain, difficulty breathing or further syncope. Currently she is not getting depressed with anxious. She continue with mild to moderate nausea, appetite decreased because of nausea. She also continue with frontal headache which is mild.  Past Medical History  Diagnosis Date  . Insomnia   . Adenomatous polyp of colon   . Overweight(278.02)   . Symptomatic menopausal or female climacteric states   . Hyperlipidemia   . Osteoarthrosis, unspecified whether generalized or localized, unspecified site   . Anxiety state, unspecified   . Hypertension   . Syncope and collapse     11/12: Holter 12/12 with rare PACS, HR range 64-140, average 82, no significant arrhythmias. Echo (1/13): EF 50-55%, mild LVH, septal-lateral dyssynchrony c/w LBBB. 3-week event monitor (1/13): No significant arrhythmia.   Marland Kitchen LBBB (left bundle branch block)     LHC in 2002 showed normal coronaries.     Past Surgical History  Procedure Laterality Date  . Tubal ligation      Social History   Social History  . Marital Status: Married    Spouse Name: N/A  . Number of Children: 2  . Years of Education: N/A   Occupational History  .  retired Education administrator   Social History Main Topics  . Smoking status: Never Smoker   . Smokeless tobacco: Never Used  . Alcohol Use: No  . Drug Use: No  . Sexual Activity: Not on file   Other Topics Concern  . Not on file   Social History Narrative   HSG.  Married '65.  2 daughters, '71, '77; 4 grandchildren                 Medication List       This list is accurate as of: 02/12/15 11:59 PM.  Always use your most recent med list.               acetaminophen 325 MG tablet  Commonly known as:  TYLENOL  Take 2 tablets (650 mg total) by mouth 2 (two) times daily with breakfast and lunch. To help manage headaches during the day.     ALPRAZolam 0.25 MG tablet  Commonly known as:  XANAX  Take 1-2 tablets (0.25-0.5 mg total) by mouth at bedtime as needed for sleep.     calcium carbonate 600 MG Tabs tablet  Commonly known as:  OS-CAL  Take 1,200 mg by mouth daily with breakfast.     cholecalciferol 1000 UNITS tablet  Commonly known as:  VITAMIN D  Take 2,000 Units by mouth daily.     metoprolol tartrate 25 MG tablet  Commonly known as:  LOPRESSOR  Take 1 tablet (25 mg total) by mouth  2 (two) times daily.     ondansetron 4 MG tablet  Commonly known as:  ZOFRAN  Take 1 tablet (4 mg total) by mouth every 8 (eight) hours as needed for nausea or vomiting.     PARoxetine 10 MG tablet  Commonly known as:  PAXIL  Take 10 mg by mouth daily.     pyridostigmine 60 MG tablet  Commonly known as:  MESTINON  Take 1 tablet (60 mg total) by mouth 3 (three) times daily.     senna-docusate 8.6-50 MG tablet  Commonly known as:  Senokot-S  Take 1 tablet by mouth at bedtime. As needed for constipation     topiramate 25 MG tablet  Commonly known as:  TOPAMAX  Take 0.5 tablets (12.5 mg total) by mouth at bedtime. To help manage headaches.           Objective:   Physical Exam BP 122/62 mmHg  Pulse 58  Temp(Src) 97.5 F (36.4 C) (Oral)   Ht 5\' 7"  (1.702 m)  Wt 144 lb 6 oz (65.488 kg)  BMI 22.61 kg/m2  SpO2 98% General:   Well developed, well nourished . NAD.  HEENT:  Normocephalic . Face symmetric, atraumatic Lungs:  CTA B Normal respiratory effort, no intercostal retractions, no accessory muscle use. Heart: RRR,  no murmur.  No pretibial edema bilaterally  Skin: Not pale. Not jaundice. Several stitches at the nuchal  Neurologic:  alert & oriented X3.  Speech normal except for difficulty finding her words., gait assisted by a walker. Strength symmetric Psych--  Cognition and judgment appear intact.  Cooperative with normal attention span and concentration.  Behavior appropriate. No anxious or depressed appearing.      Assessment & Plan:   Assessment> Prediabetes  HTN Hyperlipidemia Anxiety Menopausal Recurrent Syncope-- extensive cards eval before, on mestinon LBBB Traumatic SAH (after a syncope), admitted 01-2015   PLAN Syncope:Refer to cardiology Traumatic SAH: s/p rehab, now living at home with her husband. Refer to Dr. Saintclair Halsted Patient is waiting from a call to see Dr. Tessa Lerner (Rehab) Continue with nausea and mild headache, likely postconcussion, will refill zofran. Stitches removed Insomnia: Currently taking Xanax, refill sent. Not taking Seroquel RTC 4 weeks

## 2015-02-12 NOTE — Progress Notes (Signed)
Pre visit review using our clinic review tool, if applicable. No additional management support is needed unless otherwise documented below in the visit note. 

## 2015-02-12 NOTE — Therapy (Signed)
[place] [facility] [address] [place], [place], [address] Phone: [phone]   Fax:  [phone]  Physical Therapy Evaluation  Patient Details  Name: Mandy Durham MRN: [id number] Date of Birth: 24-Feb-1946 Referring Provider: Dr. Alger Simons  Encounter Date: 02/11/2015      PT End of Session - 02/11/15 1128    Visit Number 1   Number of Visits 16   Date for PT Re-Evaluation 04/11/15   Authorization Type G code every 10th visit, $40 copay   PT Start Time 0935   PT Stop Time 1025   PT Time Calculation (min) 50 min   Equipment Utilized During Treatment Gait belt   Activity Tolerance Patient tolerated treatment well   Behavior During Therapy [facility] for tasks assessed/performed      Past Medical History  Diagnosis Date  . Insomnia   . Adenomatous polyp of colon   . Overweight(278.02)   . Symptomatic menopausal or female climacteric states   . Hyperlipidemia   . Osteoarthrosis, unspecified whether generalized or localized, unspecified site   . Anxiety state, unspecified   . Hypertension   . Syncope and collapse     11/12: Holter 12/12 with rare PACS, HR range 64-140, average 82, no significant arrhythmias. Echo (1/13): EF 50-55%, mild LVH, septal-lateral dyssynchrony c/w LBBB. 3-week event monitor (1/13): No significant arrhythmia.   Marland Kitchen LBBB (left bundle branch block)     LHC in 2002 showed normal coronaries.     Past Surgical History  Procedure Laterality Date  . Tubal ligation      There were no vitals filed for this visit.  Visit Diagnosis:  Abnormality of gait  Generalized muscle weakness      Subjective Assessment - 02/11/15 0943    Subjective The patient is s/p a fall on 01/21/2015 reporting a backwards pull while washing dishes.  She was transferred to IP rehab and underwent stay on CIR from 01/28/2015-02/05/2015.  She is now using a rollater RW in the house and in the community.  She reports dizziness  at times.  She currently has a headache in frontal region.     Pertinent History Intermittent h/o syncopal episodes, known to our clinic from PT for BPPV, Left bundle branch block.   Patient Stated Goals Be able to get up and walk alone.  Also wants to return to driving and exercising.  (enjoys spin class at [employer])   Currently in Pain? Yes   Pain Score 4    Pain Location Head   Pain Orientation Anterior   Pain Descriptors / Indicators Aching   Pain Type Acute pain   Pain Onset 1 to 4 weeks ago   Pain Frequency Intermittent   Aggravating Factors  daily with head soreness   Pain Relieving Factors medication (tylenol)helps with headache            [place] PT Assessment - 02/11/15 0948    Assessment   Medical Diagnosis TBI s/p fall   Referring Provider Dr. Alger Simons   Onset Date/Surgical Date 01/21/15   Hand Dominance Right   Prior Therapy known to our clinic from prior treatment for BPPV   Precautions   Precautions Fall   Precaution Comments has 24 hour supervision at this time   Restrictions   Weight Bearing Restrictions No   Balance Screen   Has the patient fallen in the past 6 months Yes   How many times? --  1   Has the patient had  a decrease in activity level because of a fear of falling?  Yes   Is the patient reluctant to leave their home because of a fear of falling?  Yes   [place] residence   Living Arrangements Spouse/significant other   Type of [place] to enter   Entrance Stairs-Number of Steps 2   [place] One level   [place] - 4 wheels;Shower seat   Prior Function   Level of Independence Independent   Leisure Patient enjoyed spin class at [employer] and was active.   Cognition   Behaviors --  patient reports no memory of fall, feels STM WNLs   Observation/Other Assessments   Focus on Therapeutic Outcomes (FOTO)  50%   Other Surveys  --   neuro QOL=40%   Sensation   Light Touch Appears Intact   Coordination   Gross Motor Movements are Fluid and Coordinated Yes   Fine Motor Movements are Fluid and Coordinated Yes   Finger Nose Finger Test normal   Posture/Postural Control   Posture/Postural Control Postural limitations   Postural Limitations Rounded Shoulders   ROM / Strength   AROM / PROM / Strength AROM;Strength   AROM   Overall AROM  Within functional limits for tasks performed   Strength   Overall Strength Deficits   Overall Strength Comments Bilateral shoulder flexion/abduction4/5, elbow flexion 4/5, hip flexion 3+/5, knee flexion/extension 4+/5, ankle dorsiflexion 3/5.   Bed Mobility   Bed Mobility Rolling Right;Rolling Left;Supine to Sit   Rolling Right 6: Modified independent (Device/Increase time)   Rolling Left 6: Modified independent (Device/Increase time)   Supine to Sit 6: Modified independent (Device/Increase time)   Sit to Supine 6: Modified independent (Device/Increase time)   Transfers   Transfers Sit to Stand   Sit to Stand 5: Supervision   Ambulation/Gait   Ambulation/Gait Yes   Ambulation/Gait Assistance 5: Supervision   Assistive device 4-wheeled walker   Gait velocity 1.65 ft/sec   Stairs Yes   Stairs Assistance 5: Supervision   Stair Management Technique One rail Right;Step to pattern   Number of Stairs 4   Standardized Balance Assessment   Standardized Balance Assessment Berg Balance Test;Timed Up and Go Test   Berg Balance Test   Sit to Stand Able to stand  independently using hands   Standing Unsupported Able to stand safely 2 minutes   Sitting with Back Unsupported but Feet Supported on Floor or Stool Able to sit safely and securely 2 minutes   Stand to Sit Sits safely with minimal use of hands   Transfers Able to transfer safely, definite need of hands   Standing Unsupported with Eyes Closed Able to stand 10 seconds with supervision   Standing Ubsupported with Feet Together Able  to place feet together independently and stand for 1 minute with supervision   From Standing, Reach Forward with Outstretched Arm Can reach forward >12 cm safely (5")   From Standing Position, Pick up Object from Floor Able to pick up shoe, needs supervision   From Standing Position, Turn to Look Behind Over each Shoulder Needs supervision when turning   Turn 360 Degrees Needs close supervision or verbal cueing   Standing Unsupported, Alternately Place Feet on Step/Stool Able to complete 4 steps without aid or supervision   Standing Unsupported, One Foot in Front Able to plae foot ahead of the other independently and hold 30 seconds  Standing on One Leg Tries to lift leg/unable to hold 3 seconds but remains standing independently   Total Score 38   Berg comment: 38/56 indicating high risk for falls           PT Short Term Goals - 02/11/15 1132    PT SHORT TERM GOAL #1   Title The patient will be indep with HEP for LE strenegthening and general mobility.   Baseline Target date 03/12/2015   Time 4   Period Weeks   PT SHORT TERM GOAL #2   Title The patient will move sit<>stand x 5 reps (resting in between due to orthostasis) with UE support modified indep to demo improved mobility.   Baseline Target date 03/12/2015   Time 4   Period Weeks   PT SHORT TERM GOAL #3   Title The patient will improve gait speed from 1.65 ft/sec to > or equal to 2.0 ft/sec to demo decreased risk for falls.   Baseline Target date 03/12/2015   Time 4   Period Weeks   PT SHORT TERM GOAL #4   Title The patient will negotiate 4 steps with one handrail modified indep with reciprocal pattern.   Baseline Target date 03/12/2015   Time 4   Period Weeks   PT SHORT TERM GOAL #5   Title The patient will improve Berg score from 38/56 to > or equal to 44/56 to demo decreasing risk for falls.   Baseline Target date 03/12/2015   Time 4   Period Weeks           PT Long Term Goals - 02/12/15 1135    PT LONG TERM  GOAL #1   Title The patient will verbalize understanding of return to community exercise.   Baseline Target date 04/11/2015   Time 8   Period Weeks   PT LONG TERM GOAL #2   Title The patient will improve neuro QOL (from 40%) by 15% to demo improving quality of life/subjective measures.   Baseline Target date 04/11/2015   Time 8   Period Weeks   PT LONG TERM GOAL #3   Title The patient will improve ait speed from 1.65 ft/sec to > or equal to 2.4 ft/sec to demo improving confidence with ambulation.   Baseline Target date 04/11/2015   Time 8   Period Weeks   PT LONG TERM GOAL #4   Title The patient will improve stair negotiation x 4 steps without a handrail independently.   Baseline Target date 04/11/2015   Time 8   Period Weeks   PT LONG TERM GOAL #5   Title The patient will improve Berg score from 38/56 to > or equal to 47/56.   Baseline Target date 04/11/2015   Time 8   Period Weeks               Plan - 02/12/15 1342    Clinical Impression Statement The patient is a 69 yo female s/p TBI with SAH.  She presents to physical therapy with 24 hour supervision using a rollater RW for mobility.  She is a high fall risk per Berg score, gait speed.  She has other significant fall risk factor of orthostatic hypotension that has created syncopal episodes in the past.  PT provided CDC handout today for patient education.   Pt will benefit from skilled therapeutic intervention in order to improve on the following deficits Abnormal gait;Decreased activity tolerance;Decreased balance;Decreased mobility;Decreased strength;Difficulty walking;Dizziness;Pain;Decreased endurance   Rehab Potential Good  PT Frequency 2x / week   PT Duration 8 weeks   PT Treatment/Interventions ADLs/Self Care Home Management;Therapeutic exercise;Therapeutic activities;Gait training;Neuromuscular re-education;Canalith Repostioning;Vestibular;Patient/family education  vestibular rehab as needed- appears symptoms  improved with treatment in hospital   PT Next Visit Plan HEP: balance and LE strength, gait training without device with assist, functional mobility training.  Assess vertigo as indicated.   Consulted and Agree with Plan of Care Patient          G-Codes - 2015/02/14 1351    Functional Assessment Tool Used Berg=38/56, gait speed=1.65 ft/sec   Functional Limitation Mobility: Walking and moving around   Mobility: Walking and Moving Around Current Status [phone]) At least 20 percent but less than 40 percent impaired, limited or restricted   Mobility: Walking and Moving Around Goal Status [id number]) At least 1 percent but less than 20 percent impaired, limited or restricted       Problem List Patient Active Problem List   Diagnosis Date Noted  . Headache due to trauma 02/05/2015  . TBI (traumatic brain injury) ([place]) 01/28/2015  . Insomnia   . Hypoproteinemia ([place])   . Essential hypertension   . Acute post-traumatic headache, not intractable   . Skull fracture ([place])   . Vertigo due to brain injury ([place])   . Subarachnoid bleed ([place]) 01/22/2015  . SAH (subarachnoid hemorrhage) ([place]) 01/21/2015  . Annual physical exam 09/07/2014  . Orthostatic hypotension 08/30/2014  . Acromioclavicular arthrosis 03/21/2014  . Allergic rhinitis 02/27/2014  . SI (sacroiliac) joint dysfunction 02/02/2014  . Gastrocnemius strain, left 12/11/2013  . Toenail fungus 11/15/2013  . Back pain 08/22/2013  . LBBB (left bundle branch block) 05/13/2013  . Shingles outbreak 12/15/2012  . Borderline diabetes   . Essential hypertension, benign 07/14/2012  . Syncope and collapse 02/15/2011  . Paresthesia of foot 07/07/2010  . INSOMNIA, CHRONIC, MILD 01/12/2008  . OVERWEIGHT 10/12/2007  . HLD (hyperlipidemia) 02/04/2007  . OSTEOPENIA 02/04/2007  . ANXIETY 10/12/2006  . OSTEOARTHRITIS 10/12/2006  Thank you for the referral of this patient. Rudell Cobb, MPT   [place], PT 02/12/2015, 1:51 PM  [facility] [address] [place], [place], [address] Phone: [phone]   Fax:  [phone]  Name: Mandy Durham MRN: [id number] Date of Birth: Aug 12, 1945

## 2015-02-12 NOTE — Patient Instructions (Signed)
Take Zofran as needed for nausea  Come back in 4 weeks, please make an appointment.

## 2015-02-13 DIAGNOSIS — Z09 Encounter for follow-up examination after completed treatment for conditions other than malignant neoplasm: Secondary | ICD-10-CM | POA: Insufficient documentation

## 2015-02-13 NOTE — Assessment & Plan Note (Addendum)
Syncope:Refer to cardiology Traumatic SAH: s/p rehab, now living at home with her husband. Refer to Dr. Saintclair Halsted Patient is waiting from a call to see Dr. Tessa Lerner (Rehab) Continue with nausea and mild headache, likely postconcussion, will refill zofran. Stitches removed Insomnia: Currently taking Xanax, refill sent. Not taking Seroquel RTC 4 weeks

## 2015-02-14 ENCOUNTER — Telehealth: Payer: Self-pay | Admitting: Internal Medicine

## 2015-02-14 MED ORDER — MECLIZINE HCL 12.5 MG PO TABS: 12.5000 mg | ORAL_TABLET | Freq: Three times a day (TID) | ORAL | Status: DC | PRN

## 2015-02-14 NOTE — Telephone Encounter (Signed)
Spoke with Pt, informed her that Antivert 12.5 mg has been sent to [facility], that she can take 1 tablet by mouth 3 times daily as needed. Informed her to watch for excessive drowsiness and to let us know if not improving or if dizziness becomes worse. Pt verbalized understanding.

## 2015-02-14 NOTE — Telephone Encounter (Signed)
Caller name: Self   Can be reached: [phone]  Pharmacy: [facility] [address] ([address] [place] [id number] (Phone) [phone] (Fax)         Reason for call: Patient states that she has been dizzy since yesterday and is requesting a prescription be called in for her. Asked if she wanted to make appointment to be seen but she declined stating she has an appointment 1/6.

## 2015-02-14 NOTE — Telephone Encounter (Signed)
Please advise 

## 2015-02-14 NOTE — Telephone Encounter (Signed)
Dizziness is felt to be postconcussion, if she is getting much worse she needs to notify us, otherwise call Antivert 12.5 mg one by mouth 3 times a day when necessary #30 no refills. Watch for excessive somnolence.

## 2015-02-18 ENCOUNTER — Ambulatory Visit
Payer: Medicare HMO | Attending: Physical Medicine & Rehabilitation | Admitting: Rehabilitative and Restorative Service Providers"

## 2015-02-18 DIAGNOSIS — R41841 Cognitive communication deficit: Secondary | ICD-10-CM | POA: Insufficient documentation

## 2015-02-18 DIAGNOSIS — X58XXXS Exposure to other specified factors, sequela: Secondary | ICD-10-CM | POA: Insufficient documentation

## 2015-02-18 DIAGNOSIS — S069X0A Unspecified intracranial injury without loss of consciousness, initial encounter: Secondary | ICD-10-CM | POA: Diagnosis present

## 2015-02-18 DIAGNOSIS — R4701 Aphasia: Secondary | ICD-10-CM | POA: Diagnosis present

## 2015-02-18 DIAGNOSIS — R269 Unspecified abnormalities of gait and mobility: Secondary | ICD-10-CM | POA: Diagnosis present

## 2015-02-18 DIAGNOSIS — S069X1S Unspecified intracranial injury with loss of consciousness of 30 minutes or less, sequela: Secondary | ICD-10-CM | POA: Insufficient documentation

## 2015-02-18 DIAGNOSIS — M6281 Muscle weakness (generalized): Secondary | ICD-10-CM

## 2015-02-18 NOTE — Therapy (Signed)
[facility] [address] [place], [place], [address] Phone: [phone]   Fax:  [phone]  Physical Therapy Treatment  Patient Details  Name: Mandy Durham MRN: [id number] Date of Birth: 11-15-1945 Referring Provider: Dr. Alger Simons  Encounter Date: 02/18/2015      PT End of Session - 02/18/15 1008    Visit Number 2   Number of Visits 16   Date for PT Re-Evaluation 04/11/15   Authorization Type G code every 10th visit, $40 copay   PT Start Time 0850   PT Stop Time 0930   PT Time Calculation (min) 40 min   Equipment Utilized During Treatment Gait belt   Activity Tolerance Patient tolerated treatment well   Behavior During Therapy [facility] for tasks assessed/performed      Past Medical History  Diagnosis Date  . Insomnia   . Adenomatous polyp of colon   . Overweight(278.02)   . Symptomatic menopausal or female climacteric states   . Hyperlipidemia   . Osteoarthrosis, unspecified whether generalized or localized, unspecified site   . Anxiety state, unspecified   . Hypertension   . Syncope and collapse     11/12: Holter 12/12 with rare PACS, HR range 64-140, average 82, no significant arrhythmias. Echo (1/13): EF 50-55%, mild LVH, septal-lateral dyssynchrony c/w LBBB. 3-week event monitor (1/13): No significant arrhythmia.   Marland Kitchen LBBB (left bundle branch block)     LHC in 2002 showed normal coronaries.     Past Surgical History  Procedure Laterality Date  . Tubal ligation      There were no vitals filed for this visit.  Visit Diagnosis:  Abnormality of gait  Generalized muscle weakness      Subjective Assessment - 02/18/15 0853    Subjective The patient reports one day with dizziness last week that was constant in nature and occurred while sitting still and standing.  She took one meclizine and it improved and has not returned.  She is reporting intermittent lightheadedness.     Patient Stated Goals Be  able to get up and walk alone.  Also wants to return to driving and exercising.  (enjoys spin class at [employer])   Currently in Pain? No/denies  had a headache 3 times in the past week      THERAPEUTIC EXERCISE: Standing balance exercises near countertop consisting of: Marching x 20 reps Mini squats x 15 reps Sidestepping x 10 feet x 4 reps Heel/toe raises x 20 reps Patient required stand by assist for all activities.  Step ups to 6" step with bilateral UE support x 10 reps each side.  NEUROMUSCULAR RE-EDUCATION: Corner balance exercises consisting of: Standing on level surface with feet apart/together and partial heel/toe with variable conditions of eyes open/closed and adding horiz/vertical head turns as tolerated by the patient with SBA for safety.   Gait: Ambulation without a device emphasizing "powerful" steps to engage R quads during initial contact to midstance phase of gait x 230 feet Backwards walking with CGA for safety with cues on wider base of support Gait emphasizing arm swing with CGA for safety        PT Education - 02/18/15 0924    Education provided Yes   Education Details HEP heel raise, toe raise, mini squat, side step, partial heel toe/closed eyes, feet apart head turns.   Person(s) Educated Patient   Methods Explanation;Demonstration;Handout   Comprehension Verbalized understanding;Returned demonstration          PT Short  Term Goals - 02/11/15 1132    PT SHORT TERM GOAL #1   Title The patient will be indep with HEP for LE strenegthening and general mobility.   Baseline Target date 03/12/2015   Time 4   Period Weeks   PT SHORT TERM GOAL #2   Title The patient will move sit<>stand x 5 reps (resting in between due to orthostasis) with UE support modified indep to demo improved mobility.   Baseline Target date 03/12/2015   Time 4   Period Weeks   PT SHORT TERM GOAL #3   Title The patient will improve gait speed from 1.65 ft/sec to > or equal to  2.0 ft/sec to demo decreased risk for falls.   Baseline Target date 03/12/2015   Time 4   Period Weeks   PT SHORT TERM GOAL #4   Title The patient will negotiate 4 steps with one handrail modified indep with reciprocal pattern.   Baseline Target date 03/12/2015   Time 4   Period Weeks   PT SHORT TERM GOAL #5   Title The patient will improve Berg score from 38/56 to > or equal to 44/56 to demo decreasing risk for falls.   Baseline Target date 03/12/2015   Time 4   Period Weeks           PT Long Term Goals - 02/12/15 1135    PT LONG TERM GOAL #1   Title The patient will verbalize understanding of return to community exercise.   Baseline Target date 04/11/2015   Time 8   Period Weeks   PT LONG TERM GOAL #2   Title The patient will improve neuro QOL (from 40%) by 15% to demo improving quality of life/subjective measures.   Baseline Target date 04/11/2015   Time 8   Period Weeks   PT LONG TERM GOAL #3   Title The patient will improve ait speed from 1.65 ft/sec to > or equal to 2.4 ft/sec to demo improving confidence with ambulation.   Baseline Target date 04/11/2015   Time 8   Period Weeks   PT LONG TERM GOAL #4   Title The patient will improve stair negotiation x 4 steps without a handrail independently.   Baseline Target date 04/11/2015   Time 8   Period Weeks   PT LONG TERM GOAL #5   Title The patient will improve Berg score from 38/56 to > or equal to 47/56.   Baseline Target date 04/11/2015   Time 8   Period Weeks               Plan - 02/18/15 1009    Clinical Impression Statement The patient tolerates exercises well today without reports of dizziness and or lightheadedness.  Continue to work towards STGs/LTGs as tolerated.   PT Next Visit Plan Check HEP, gait without device, 180 degree turns, functional mobility, balance and assess vertigo as indicated.        Problem List Patient Active Problem List   Diagnosis Date Noted  . PCP NOTES >>>> 02/13/2015   . Headache due to trauma 02/05/2015  . TBI (traumatic brain injury) ([place]) 01/28/2015  . Insomnia   . Hypoproteinemia ([place])   . Essential hypertension   . Acute post-traumatic headache, not intractable   . Skull fracture ([place])   . Vertigo due to brain injury ([place])   . Subarachnoid bleed ([place]) 01/22/2015  . SAH (subarachnoid hemorrhage) ([place]) 01/21/2015  . Annual physical exam 09/07/2014  . Orthostatic hypotension  08/30/2014  . Acromioclavicular arthrosis 03/21/2014  . Allergic rhinitis 02/27/2014  . SI (sacroiliac) joint dysfunction 02/02/2014  . Gastrocnemius strain, left 12/11/2013  . Toenail fungus 11/15/2013  . Back pain 08/22/2013  . LBBB (left bundle branch block) 05/13/2013  . Shingles outbreak 12/15/2012  . Borderline diabetes   . Essential hypertension, benign 07/14/2012  . Syncope and collapse 02/15/2011  . Paresthesia of foot 07/07/2010  . INSOMNIA, CHRONIC, MILD 01/12/2008  . OVERWEIGHT 10/12/2007  . HLD (hyperlipidemia) 02/04/2007  . OSTEOPENIA 02/04/2007  . ANXIETY 10/12/2006  . OSTEOARTHRITIS 10/12/2006    Rubie Ficco, PT 02/18/2015, 10:10 AM  [place] [address] [place], [place], [address] Phone: [phone]   Fax:  [phone]  Name: Anaiah WING PERAINO MRN: [id number] Date of Birth: Aug 07, 1945

## 2015-02-18 NOTE — Patient Instructions (Signed)
Heel Raise: Bilateral (Standing)    NEAR A COUNTERTOP/ HOLD ON FOR SUPPORT.  Rise on balls of feet. Repeat _20___ times per set. Do __1__ sets per session. Do __2__ sessions per day.  http://orth.exer.us/39   Copyright  VHI. All rights reserved.  Toe Raise (Standing)    NEAR A COUNTERTOP/HOLD ON FOR SUPPORT.  Rock back on heels. Repeat __20__ times per set. Do _1___ sets per session. Do __2__ sessions per day.  http://orth.exer.us/43   Copyright  VHI. All rights reserved.  Mini Squat: Double Leg    NEAR A COUNTERTOP/ HOLD FOR SUPPORT.  With feet shoulder width apart, reach forward for balance and do a mini squat. Keep knees in line with second toe. Knees do not go past toes. Repeat _15__ times per set. Do 2 times per day. http://plyo.exer.us/70   Copyright  VHI. All rights reserved.  Feet Apart, Head Motion - Eyes Open    With eyes open, feet apart, move head slowly: up and down.  Rest and then side to side.  10 times each direction. Do __2__ sessions per day.  Copyright  VHI. All rights reserved.  Feet Partial Heel-Toe, Varied Arm Positions - Eyes Closed    Stand with right foot partially in front of the other and arms at your side. Close eyes and visualize upright position. Hold _30___ seconds. Repeat __3__ times each leg per session. Do _2___ sessions per day.  Copyright  VHI. All rights reserved.  AMBULATION: Side Step    NEAR A COUNTER/HOLD AS NEEDED FOR SUPPORT.  Step sideways. Repeat in opposite direction. 10 side steps each direction x 3 repetitions.  Repeat 2 times per day.  Copyright  VHI. All rights reserved.

## 2015-02-20 ENCOUNTER — Ambulatory Visit: Payer: Medicare HMO | Admitting: Rehabilitative and Restorative Service Providers"

## 2015-02-20 ENCOUNTER — Ambulatory Visit: Payer: Medicare HMO | Admitting: Cardiology

## 2015-02-20 VITALS — BP 147/68 | HR 62

## 2015-02-20 DIAGNOSIS — R269 Unspecified abnormalities of gait and mobility: Secondary | ICD-10-CM | POA: Diagnosis not present

## 2015-02-20 DIAGNOSIS — M6281 Muscle weakness (generalized): Secondary | ICD-10-CM

## 2015-02-20 NOTE — Patient Instructions (Signed)
Gaze Stabilization: Tip Card 1.Target must remain in focus, not blurry, and appear stationary while head is in motion. 2.Perform exercises with small head movements (45 to either side of midline). 3.Increase speed of head motion so long as target is in focus. 4.If you wear eyeglasses, be sure you can see target through lens (therapist will give specific instructions for bifocal / progressive lenses). 5.These exercises may provoke dizziness or nausea. Work through these symptoms. If too dizzy, slow head movement slightly. Rest between each exercise. 6.Exercises demand concentration; avoid distractions. 7.For safety, perform standing exercises close to a counter, wall, corner, or next to someone.  Copyright  VHI. All rights reserved.  Gaze Stabilization: Standing Feet Apart   Feet shoulder width apart, keeping eyes on target on wall 3 feet away, tilt head down slightly and move head side to side for 30 seconds. Do 2 sessions per day.   Copyright  VHI. All rights reserved.

## 2015-02-20 NOTE — Therapy (Signed)
[facility] [address] [place], [place], [address] Phone: [phone]   Fax:  [phone]  Physical Therapy Treatment  Patient Details  Name: Mandy Durham RISSE MRN: [id number] Date of Birth: February 21, 1946 Referring Provider: Dr. Alger Simons  Encounter Date: 02/20/2015      PT End of Session - 02/20/15 0838    Visit Number 3   Number of Visits 16   Date for PT Re-Evaluation 04/11/15   Authorization Type G code every 10th visit, $40 copay   PT Start Time 0840   PT Stop Time 0925   PT Time Calculation (min) 45 min   Equipment Utilized During Treatment Gait belt   Activity Tolerance Patient tolerated treatment well   Behavior During Therapy [facility] for tasks assessed/performed      Past Medical History  Diagnosis Date  . Insomnia   . Adenomatous polyp of colon   . Overweight(278.02)   . Symptomatic menopausal or female climacteric states   . Hyperlipidemia   . Osteoarthrosis, unspecified whether generalized or localized, unspecified site   . Anxiety state, unspecified   . Hypertension   . Syncope and collapse     11/12: Holter 12/12 with rare PACS, HR range 64-140, average 82, no significant arrhythmias. Echo (1/13): EF 50-55%, mild LVH, septal-lateral dyssynchrony c/w LBBB. 3-week event monitor (1/13): No significant arrhythmia.   Marland Kitchen LBBB (left bundle branch block)     LHC in 2002 showed normal coronaries.     Past Surgical History  Procedure Laterality Date  . Tubal ligation      Filed Vitals:   02/20/15 0846  BP: 147/68  Pulse: 62    Visit Diagnosis:  Abnormality of gait  Generalized muscle weakness      Subjective Assessment - 02/20/15 0843    Subjective The patient reports no dizziness at all since last visit.  She is monitoring her blood pressure on a daily basis (taking 2x/day).   Patient Stated Goals Be able to get up and walk alone.  Also wants to return to driving and exercising.  (enjoys spin  class at [employer])   Currently in Pain? No/denies      NEUROMUSCULAR RE-EDUCATION: Gaze x 1 viewing horizontal seated and standing x 30 seconds with cues on correct technqiue to keep letter focus and clear Standing cone taps for lateral weight shifting and single limb control Kicking cones over and picking up Figure 8 turns x 8 reps without loss of balance 360 degree turns with supervision Wide leg marching x 10 reps  Sit<>stand without UE support independently x 10 reps  Gait: Treadmill x 5 minutes up to 2.55mh with UE support emphasizing knee extension at initial contact. Gait activities including marching, backwards walking, slow/fast walking, turns with CGA x >350 feet nonstop Gait without device x 450 feet nonstop with emphasis on improving speed and stride length      PT Education - 02/20/15 0925    Education provided Yes   Education Details HEP: gaze x 1 viewing standing, continue current HEP   Person(s) Educated Patient   Methods Explanation;Demonstration;Handout   Comprehension Verbalized understanding;Returned demonstration          PT Short Term Goals - 02/20/15 0926    PT SHORT TERM GOAL #1   Title The patient will be indep with HEP for LE strenegthening and general mobility.   Baseline Met on 02/20/2015   Time 4   Period Weeks   Status Achieved   PT  SHORT TERM GOAL #2   Title The patient will move sit<>stand x 5 reps (resting in between due to orthostasis) with UE support modified indep to demo improved mobility.   Baseline Met on 02/20/2015   Time 4   Period Weeks   Status Achieved   PT SHORT TERM GOAL #3   Title The patient will improve gait speed from 1.65 ft/sec to > or equal to 2.0 ft/sec to demo decreased risk for falls.   Baseline Target date 03/12/2015   Time 4   Period Weeks   Status On-going   PT SHORT TERM GOAL #4   Title The patient will negotiate 4 steps with one handrail modified indep with reciprocal pattern.   Baseline Target date  03/12/2015   Time 4   Period Weeks   Status On-going   PT SHORT TERM GOAL #5   Title The patient will improve Berg score from 38/56 to > or equal to 44/56 to demo decreasing risk for falls.   Baseline Target date 03/12/2015   Time 4   Period Weeks   Status On-going           PT Long Term Goals - 02/12/15 1135    PT LONG TERM GOAL #1   Title The patient will verbalize understanding of return to community exercise.   Baseline Target date 04/11/2015   Time 8   Period Weeks   PT LONG TERM GOAL #2   Title The patient will improve neuro QOL (from 40%) by 15% to demo improving quality of life/subjective measures.   Baseline Target date 04/11/2015   Time 8   Period Weeks   PT LONG TERM GOAL #3   Title The patient will improve ait speed from 1.65 ft/sec to > or equal to 2.4 ft/sec to demo improving confidence with ambulation.   Baseline Target date 04/11/2015   Time 8   Period Weeks   PT LONG TERM GOAL #4   Title The patient will improve stair negotiation x 4 steps without a handrail independently.   Baseline Target date 04/11/2015   Time 8   Period Weeks   PT LONG TERM GOAL #5   Title The patient will improve Berg score from 38/56 to > or equal to 47/56.   Baseline Target date 04/11/2015   Time 8   Period Weeks               Plan - 02/20/15 1916    Clinical Impression Statement The patient is progressing well and is not using walker in the home unless fatigued.  She reports decreasing dizziness and denies any further lightheadedness at this time.  Patient beginning to meet STGs with improved progress/function this week.   PT Next Visit Plan Check HEP, gait without device, 180 degree turns, functional mobility, balance and assess vertigo as indicated.   Consulted and Agree with Plan of Care Patient        Problem List Patient Active Problem List   Diagnosis Date Noted  . PCP NOTES >>>> 02/13/2015  . Headache due to trauma 02/05/2015  . TBI (traumatic brain injury)  ([place]) 01/28/2015  . Insomnia   . Hypoproteinemia ([place])   . Essential hypertension   . Acute post-traumatic headache, not intractable   . Skull fracture ([place])   . Vertigo due to brain injury ([place])   . Subarachnoid bleed ([place]) 01/22/2015  . SAH (subarachnoid hemorrhage) ([place]) 01/21/2015  . Annual physical exam 09/07/2014  . Orthostatic hypotension 08/30/2014  .  Acromioclavicular arthrosis 03/21/2014  . Allergic rhinitis 02/27/2014  . SI (sacroiliac) joint dysfunction 02/02/2014  . Gastrocnemius strain, left 12/11/2013  . Toenail fungus 11/15/2013  . Back pain 08/22/2013  . LBBB (left bundle branch block) 05/13/2013  . Shingles outbreak 12/15/2012  . Borderline diabetes   . Essential hypertension, benign 07/14/2012  . Syncope and collapse 02/15/2011  . Paresthesia of foot 07/07/2010  . INSOMNIA, CHRONIC, MILD 01/12/2008  . OVERWEIGHT 10/12/2007  . HLD (hyperlipidemia) 02/04/2007  . OSTEOPENIA 02/04/2007  . ANXIETY 10/12/2006  . OSTEOARTHRITIS 10/12/2006    Mahonri Seiden, PT 02/20/2015, 9:27 AM  [facility] [address] [place], [place], [address] Phone: [phone]   Fax:  [phone]  Name: Chakara ROMANA DEATON MRN: [id number] Date of Birth: February 01, 1946

## 2015-02-26 ENCOUNTER — Ambulatory Visit: Payer: Medicare HMO | Admitting: Rehabilitative and Restorative Service Providers"

## 2015-02-26 ENCOUNTER — Ambulatory Visit: Payer: Medicare HMO | Admitting: Speech Pathology

## 2015-02-26 DIAGNOSIS — R41841 Cognitive communication deficit: Secondary | ICD-10-CM

## 2015-02-26 DIAGNOSIS — R269 Unspecified abnormalities of gait and mobility: Secondary | ICD-10-CM | POA: Diagnosis not present

## 2015-02-26 DIAGNOSIS — M6281 Muscle weakness (generalized): Secondary | ICD-10-CM

## 2015-02-26 NOTE — Therapy (Signed)
[facility] [address] [place], [place], [address] Phone: [phone]   Fax:  [phone]  Physical Therapy Treatment  Patient Details  Name: Mandy Durham MRN: [id number] Date of Birth: 10-11-1945 Referring Provider: Dr. Alger Simons  Encounter Date: 02/26/2015      PT End of Session - 02/26/15 1040    Visit Number 4   Number of Visits 16   Date for PT Re-Evaluation 04/11/15   Authorization Type G code every 10th visit, $40 copay   PT Start Time 1024   PT Stop Time 1104   PT Time Calculation (min) 40 min   Equipment Utilized During Treatment Gait belt   Activity Tolerance Patient tolerated treatment well   Behavior During Therapy [facility] for tasks assessed/performed      Past Medical History  Diagnosis Date  . Insomnia   . Adenomatous polyp of colon   . Overweight(278.02)   . Symptomatic menopausal or female climacteric states   . Hyperlipidemia   . Osteoarthrosis, unspecified whether generalized or localized, unspecified site   . Anxiety state, unspecified   . Hypertension   . Syncope and collapse     11/12: Holter 12/12 with rare PACS, HR range 64-140, average 82, no significant arrhythmias. Echo (1/13): EF 50-55%, mild LVH, septal-lateral dyssynchrony c/w LBBB. 3-week event monitor (1/13): No significant arrhythmia.   Marland Kitchen LBBB (left bundle branch block)     LHC in 2002 showed normal coronaries.     Past Surgical History  Procedure Laterality Date  . Tubal ligation      There were no vitals filed for this visit.  Visit Diagnosis:  Abnormality of gait  Generalized muscle weakness      Subjective Assessment - 02/26/15 1027    Subjective The patient reports she is concerned that this episode of passing out could happen again.  "I want something that can ensure me that it won't happen again."  The patient is drinking more water.  She denies any further episodes of lightheadedness or feeling faint.     She has noticed some dizziess with gaze exercises since last visit.    Patient Stated Goals Be able to get up and walk alone.  Also wants to return to driving and exercising.  (enjoys spin class at [employer])   Currently in Pain? No/denies            [facility] PT Assessment - 02/26/15 1040    Ambulation/Gait   Ambulation/Gait Yes   Ambulation/Gait Assistance 6: Modified independent (Device/Increase time)   Assistive device None   Gait velocity 3.5 ft/sec   Stairs Yes   Stairs Assistance 6: Modified independent (Device/Increase time)   Stair Management Technique One rail Right   Number of Stairs 4   Standardized Balance Assessment   Standardized Balance Assessment Berg Balance Test   Berg Balance Test   Sit to Stand Able to stand without using hands and stabilize independently   Standing Unsupported Able to stand safely 2 minutes   Sitting with Back Unsupported but Feet Supported on Floor or Stool Able to sit safely and securely 2 minutes   Stand to Sit Sits safely with minimal use of hands   Transfers Able to transfer safely, minor use of hands   Standing Unsupported with Eyes Closed Able to stand 10 seconds safely   Standing Ubsupported with Feet Together Able to place feet together independently and stand 1 minute safely   From Standing, Reach Forward with Outstretched  Arm Can reach forward >12 cm safely (5")   From Standing Position, Pick up Object from Floor Able to pick up shoe safely and easily   From Standing Position, Turn to Look Behind Over each Shoulder Looks behind from both sides and weight shifts well   Turn 360 Degrees Able to turn 360 degrees safely in 4 seconds or less   Standing Unsupported, Alternately Place Feet on Step/Stool Able to stand independently and safely and complete 8 steps in 20 seconds   Standing Unsupported, One Foot in Front Able to plae foot ahead of the other independently and hold 30 seconds   Standing on One Leg Able to lift leg independently  and hold 5-10 seconds   Total Score 53   Berg comment: 53/56 improving from 38/56.      NEUROMUSCULAR RE-EDUCATION: Berg=53/56 Single limb stance activities Tandem stance activities  Gait: 3.5 ft/sec gait speed meeting STGs/LTG. Gait with slow/fast speed changes, direction changes, 180 degree turns, backwards walking, ball toss R and L sides, reading cards R and L sides, marching and tandem gait.  Toe walking, heel walking and marching on tip toes. Stairs with one HR modified indep and no handrails with supervision       PT Short Term Goals - 02/26/15 1049    PT SHORT TERM GOAL #1   Title The patient will be indep with HEP for LE strenegthening and general mobility.   Baseline Met on 02/20/2015   Time 4   Period Weeks   Status Achieved   PT SHORT TERM GOAL #2   Title The patient will move sit<>stand x 5 reps (resting in between due to orthostasis) with UE support modified indep to demo improved mobility.   Baseline Met on 02/20/2015   Time 4   Period Weeks   Status Achieved   PT SHORT TERM GOAL #3   Title The patient will improve gait speed from 1.65 ft/sec to > or equal to 2.0 ft/sec to demo decreased risk for falls.   Baseline Patient ambulates 3.5 ft/sec   Time 4   Period Weeks   Status Achieved   PT SHORT TERM GOAL #4   Title The patient will negotiate 4 steps with one handrail modified indep with reciprocal pattern.   Baseline Patient performs with one rail mod indep and with supervision with no rails.   Time 4   Period Weeks   Status Achieved   PT SHORT TERM GOAL #5   Title The patient will improve Berg score from 38/56 to > or equal to 44/56 to demo decreasing risk for falls.   Baseline Scores 53/56 on 02/26/2015   Time 4   Period Weeks   Status Achieved           PT Long Term Goals - 02/26/15 1051    PT LONG TERM GOAL #1   Title The patient will verbalize understanding of return to community exercise.   Baseline Target date 04/11/2015   Time 8    Period Weeks   Status On-going   PT LONG TERM GOAL #2   Title The patient will improve neuro QOL (from 40%) by 15% to demo improving quality of life/subjective measures.   Baseline Target date 04/11/2015   Time 8   Period Weeks   Status On-going   PT LONG TERM GOAL #3   Title The patient will improve ait speed from 1.65 ft/sec to > or equal to 2.4 ft/sec to demo improving confidence with ambulation.  Baseline Target date 04/11/2015   Time 8   Period Weeks   Status Achieved   PT LONG TERM GOAL #4   Title The patient will improve stair negotiation x 4 steps without a handrail independently.   Baseline Target date 04/11/2015   Time 8   Period Weeks   Status On-going   PT LONG TERM GOAL #5   Title The patient will improve Berg score from 38/56 to > or equal to 47/56.   Baseline Target date 04/11/2015   Time 8   Period Weeks   Status Achieved               Plan - 02/26/15 1101    Clinical Impression Statement The patient has met all STGs and met LTGs for gait speed and balance early.  Continue working towards remaining LTGs with PT providing guidance on returnn to exercise classes as MDs also provide clearance for community activities.  PT and patient  discussed not needing rollater RW in community on level, familiar environments.   PT Next Visit Plan Check HEP, gait without device, 180 degree turns, functional mobility, balance and assess vertigo as indicated.   Consulted and Agree with Plan of Care Patient        Problem List Patient Active Problem List   Diagnosis Date Noted  . PCP NOTES >>>> 02/13/2015  . Headache due to trauma 02/05/2015  . TBI (traumatic brain injury) ([place]) 01/28/2015  . Insomnia   . Hypoproteinemia ([place])   . Essential hypertension   . Acute post-traumatic headache, not intractable   . Skull fracture ([place])   . Vertigo due to brain injury ([place])   . Subarachnoid bleed ([place]) 01/22/2015  . SAH (subarachnoid hemorrhage) ([place]) 01/21/2015  . Annual  physical exam 09/07/2014  . Orthostatic hypotension 08/30/2014  . Acromioclavicular arthrosis 03/21/2014  . Allergic rhinitis 02/27/2014  . SI (sacroiliac) joint dysfunction 02/02/2014  . Gastrocnemius strain, left 12/11/2013  . Toenail fungus 11/15/2013  . Back pain 08/22/2013  . LBBB (left bundle branch block) 05/13/2013  . Shingles outbreak 12/15/2012  . Borderline diabetes   . Essential hypertension, benign 07/14/2012  . Syncope and collapse 02/15/2011  . Paresthesia of foot 07/07/2010  . INSOMNIA, CHRONIC, MILD 01/12/2008  . OVERWEIGHT 10/12/2007  . HLD (hyperlipidemia) 02/04/2007  . OSTEOPENIA 02/04/2007  . ANXIETY 10/12/2006  . OSTEOARTHRITIS 10/12/2006    Silvina Hackleman, PT 02/26/2015, 11:04 AM  [facility] [address] [place], [place], [address] Phone: [phone]   Fax:  [phone]  Name: Trezure ALEXSIA KLINDT MRN: [id number] Date of Birth: 1946/02/25

## 2015-02-26 NOTE — Therapy (Addendum)
[facility] [address] [place], [place], [address] Phone: [phone]   Fax:  [phone]  Speech Language Pathology Evaluation  Patient Details  Name: Mandy Durham MRN: [id number] Date of Birth: 1946-03-13 Referring Provider: Alger Simons  Encounter Date: 02/26/2015      End of Session - 02/26/15 1205    Visit Number 1   Number of Visits 17   Date for SLP Re-Evaluation 04/23/15   Authorization Type none   SLP Start Time 0933   SLP Stop Time  1016   SLP Time Calculation (min) 43 min   Activity Tolerance Patient tolerated treatment well      Past Medical History  Diagnosis Date  . Insomnia   . Adenomatous polyp of colon   . Overweight(278.02)   . Symptomatic menopausal or female climacteric states   . Hyperlipidemia   . Osteoarthrosis, unspecified whether generalized or localized, unspecified site   . Anxiety state, unspecified   . Hypertension   . Syncope and collapse     11/12: Holter 12/12 with rare PACS, HR range 64-140, average 82, no significant arrhythmias. Echo (1/13): EF 50-55%, mild LVH, septal-lateral dyssynchrony c/w LBBB. 3-week event monitor (1/13): No significant arrhythmia.   Marland Kitchen LBBB (left bundle branch block)     LHC in 2002 showed normal coronaries.     Past Surgical History  Procedure Laterality Date  . Tubal ligation      There were no vitals filed for this visit.  Visit Diagnosis: Cognitive communication deficit - Plan: SLP plan of care cert/re-cert      Subjective Assessment - 02/26/15 0941    Subjective "I don't think I need speech therapy - I have stuttered my whole life"   Currently in Pain? No/denies            SLP Evaluation [facility] - 02/26/15 0941    SLP Visit Information   SLP Received On 02/26/15   Referring Provider Alger Simons   Onset Date 01/21/2015   Medical Diagnosis TBI, s/p fall   Subjective   Patient/Family Stated Goal I don't think i need speech  therapy   General Information   HPI Mandy Durham is a 69 y.o. right handed female with history of HTN, anxiety, LBBB, syncope and presyncope who was admitted on 01/21/2015 after striking her head due to fall with amnesia of events and complaints of HA.She was evaluated in ED and CT of the head revealed inferior right frontal hemorrhagic contusion, non-depressed occipital skull fracture, and moderate SAH around R-sylvian fissure. CTA head negative for aneurysm, occlusion or stenosis.    Mobility Status uses a rolling walker   Prior Functional Status   Cognitive/Linguistic Baseline Within functional limits   Type of Home House    Lives With Spouse   Available Support Family   Vocation Retired   Pain Assessment   Pain Assessment No/denies pain   Cognition   Overall Cognitive Status Impaired/Different from baseline   Area of Impairment Attention;Memory;Problem solving   Current Attention Level Selective;Alternating   Memory Decreased short-term memory   Problem Solving Slow processing;Difficulty sequencing;Requires verbal cues   Awareness Impaired   Awareness Impairment Intellectual impairment;Emergent impairment   Executive Function Reasoning;Organizing;Decision Making   Reasoning Impaired   Organizing Impaired   Decision Making Impaired   Standardized Assessments   Standardized Assessments  Montreal Cognitive Assessment (MOCA)  20/30   Individuals Consulted   Consulted and Agree with Results and Recommendations Patient;Family member/caregiver  Family Member Consulted  spouse                      ADULT SLP TREATMENT - 02/26/15 0941    Cognitive-Linquistic Treatment   Skilled Treatment Trained pt and spouse on compensations for reduced attention and memory with usual min verbal cues/instruction. Generated list of cognitive enhancing activities for pt to do daily at home with occasional min A.            SLP Education - 02/26/15 1204    Education provided Yes    Education Details results of MoCA, goals, compensations for reduced attention/memory   Person(s) Educated Patient   Methods Explanation;Demonstration;Handout   Comprehension Verbalized understanding;Need further instruction          SLP Short Term Goals - 02/26/15 1219    SLP SHORT TERM GOAL #1   Title Pt will utlize external aids for schedule and medication management over 3 sessions with rare min A   Time 4   Period Weeks   Status New   SLP SHORT TERM GOAL #2   Title Pt will solve mildly complex reasoning, time and money problems with 80% accuracy and rare min A   Time 4   Period Weeks   Status New   SLP SHORT TERM GOAL #3   Title Pt will alternate attention between 2 simple cognitive linguistic tasks with 85% on each and occasional min A   Time 4   Period Weeks   SLP SHORT TERM GOAL #4   Title Pt will name 8 items for a given category with rare min A   Time 4   Period Weeks   Status New          SLP Long Term Goals - 02/26/15 1221    SLP LONG TERM GOAL #1   Title Pt will perform mildly complex financial management tasks with 90% accuracy and rare min A   Time 8   Period Weeks   Status New   SLP LONG TERM GOAL #2   Title Pt will solve mildly complex reasoning, deduction, inference problems with 85% accuracy and rare min A   Time 8   Period Weeks   Status New   SLP LONG TERM GOAL #3   Title Pt will divide attention between 2 simple cognitive linguistic tasks with 85% on each and rare min A   Time 8   Period Weeks   Status New          Plan - 02/26/15 1209    Clinical Impression Statement Mandy Durham is a 69 y.o. female referred for outpt ST after falling and hitting her head resulting in [facility] on 01/21/15. She received ST on inpt rehab for cognition and was discharged home on 02/05/15 with 24 hr supervision. Today she presents with mild to moderate cognitive linguistic impairents including memoy, reasoning/problem solving, attention and awareness. Check writing  and simple balancing task required extended time. [name] did not complete simple/basic math in her head, but wrote it out with extended time, indicating slow processing/organization. She scored a 20/30 on the [facility] Cognitive Assessment, with 26/30 being [facility]. Clock drawing, figure copying and trail task required excessive extra time . Mrs. Mccook named 1/3 animal correctly and generated 5 words in a category in 1 minute (11 is WNL). She denies word finding difficulties, indicating reduced awareness of cognitive impairments. Attention was impaired on serial 7 subractions, sentence repeitition,  and letter tap.  Abstract reasoning  also impaired for generating similarities. Her husband agrees that Mrs. Cowing is "thinking a little slower" since her fall. Short term memory is also impaired. Mrs. Lyter recalled 4/5 words immediately and 2/5 words after a delay. I recommend skilled ST to maximize cognitive linguistic skills for improved safety, independence and to reduce caregiver burden. *It should be noted that Mrs. Forti has  life long dysfluency which she reports has not changed since this fall.   Speech Therapy Frequency 2x / week   Duration --  8 weeks   Treatment/Interventions SLP instruction and feedback;Compensatory strategies;Functional tasks;Patient/family education;Language facilitation   Potential to Achieve Goals Good   Potential Considerations Ability to learn/carryover information          G-Codes - 2015-03-22 1224    Functional Assessment Tool Used NOMS   Functional Limitations Attention   Attention Current Status [id number]) At least 40 percent but less than 60 percent impaired, limited or restricted   Attention Goal Status [id number]) At least 20 percent but less than 40 percent impaired, limited or restricted      Problem List Patient Active Problem List   Diagnosis Date Noted  . PCP NOTES >>>> 02/13/2015  . Headache due to trauma 02/05/2015  . TBI (traumatic brain injury) ([place])  01/28/2015  . Insomnia   . Hypoproteinemia ([place])   . Essential hypertension   . Acute post-traumatic headache, not intractable   . Skull fracture ([place])   . Vertigo due to brain injury ([place])   . Subarachnoid bleed ([place]) 01/22/2015  . SAH (subarachnoid hemorrhage) ([place]) 01/21/2015  . Annual physical exam 09/07/2014  . Orthostatic hypotension 08/30/2014  . Acromioclavicular arthrosis 03/21/2014  . Allergic rhinitis 02/27/2014  . SI (sacroiliac) joint dysfunction 02/02/2014  . Gastrocnemius strain, left 12/11/2013  . Toenail fungus 11/15/2013  . Back pain 08/22/2013  . LBBB (left bundle branch block) 05/13/2013  . Shingles outbreak 12/15/2012  . Borderline diabetes   . Essential hypertension, benign 07/14/2012  . Syncope and collapse 02/15/2011  . Paresthesia of foot 07/07/2010  . INSOMNIA, CHRONIC, MILD 01/12/2008  . OVERWEIGHT 10/12/2007  . HLD (hyperlipidemia) 02/04/2007  . OSTEOPENIA 02/04/2007  . ANXIETY 10/12/2006  . OSTEOARTHRITIS 10/12/2006    Keyshun Elpers, Annye Rusk MS, CCC-SLP 03-22-2015, 12:28 PM  [facility] [address] [place], [place], [address] Phone: [phone]   Fax:  [phone]  Name: Kissie DNAYA RAYNE MRN: [id number] Date of Birth: 1946-01-23

## 2015-02-26 NOTE — Patient Instructions (Signed)
  Memory Strategies  W - Write it down  A - Associate it with something  R - Repeat it  M - Mental Image     Play the memory game  Try to remember 3-5 items on your store list without looking  Study a detailed picture in a magazine for 1 minute, then write down everything you can remember from the picture      Cognitive Activities you can do at home:  -[place] (easy level)  - [place]  On your computer, tablet or phone: Insurance [email or web address] Merchandiser, retail Hour Chocolate Fix Sort it out Environmental consultant App Photo Quiz App MixTwo App What's the Word?App

## 2015-02-28 ENCOUNTER — Ambulatory Visit (INDEPENDENT_AMBULATORY_CARE_PROVIDER_SITE_OTHER): Payer: Medicare HMO | Admitting: Cardiology

## 2015-02-28 ENCOUNTER — Encounter: Payer: Self-pay | Admitting: Physical Medicine & Rehabilitation

## 2015-02-28 ENCOUNTER — Encounter: Payer: Self-pay | Admitting: Cardiology

## 2015-02-28 VITALS — BP 122/62 | HR 61 | Ht 67.0 in | Wt 145.0 lb

## 2015-02-28 DIAGNOSIS — I951 Orthostatic hypotension: Secondary | ICD-10-CM

## 2015-02-28 DIAGNOSIS — I447 Left bundle-branch block, unspecified: Secondary | ICD-10-CM

## 2015-02-28 NOTE — Patient Instructions (Signed)
Medication Instructions:  No changes.  Labwork: None today  Testing/Procedures: None today  Follow-Up: Your physician recommends that you schedule a follow-up appointment in: 1 month with Richardson Dopp, PAc or Kathrene Alu  Your physician recommends that you schedule a follow-up appointment in: 3 months with Dr Aundra Dubin.      Any Other Special Instructions Will Be Listed Below (If Applicable).  Dr Aundra Dubin has given you a prescription for an abdominal binder. Put it on when you get up in the morning and take it off when you go to bed at night.   If you need a refill on your cardiac medications before your next appointment, please call your pharmacy.

## 2015-03-01 ENCOUNTER — Ambulatory Visit: Payer: Medicare HMO | Admitting: Internal Medicine

## 2015-03-01 ENCOUNTER — Other Ambulatory Visit: Payer: Self-pay | Admitting: Neurosurgery

## 2015-03-01 ENCOUNTER — Ambulatory Visit: Payer: Medicare HMO | Admitting: Rehabilitative and Restorative Service Providers"

## 2015-03-01 DIAGNOSIS — I609 Nontraumatic subarachnoid hemorrhage, unspecified: Secondary | ICD-10-CM

## 2015-03-01 DIAGNOSIS — M6281 Muscle weakness (generalized): Secondary | ICD-10-CM

## 2015-03-01 DIAGNOSIS — R269 Unspecified abnormalities of gait and mobility: Secondary | ICD-10-CM

## 2015-03-01 NOTE — Therapy (Signed)
[facility] [address] [place], [place], [address] Phone: [phone]   Fax:  [phone]  Physical Therapy Treatment  Patient Details  Name: Mandy Durham MRN: [id number] Date of Birth: Jul 17, 1945 Referring Provider: Dr. Faith Rogue  Encounter Date: 03/01/2015      PT End of Session - 03/01/15 0925    Visit Number 5   Number of Visits 16   Date for PT Re-Evaluation 04/11/15   Authorization Type G code every 10th visit, $40 copay   PT Start Time 0850   PT Stop Time 0934   PT Time Calculation (min) 44 min   Equipment Utilized During Treatment Gait belt   Activity Tolerance Patient tolerated treatment well   Behavior During Therapy [facility] for tasks assessed/performed      Past Medical History  Diagnosis Date  . Insomnia   . Adenomatous polyp of colon   . Overweight(278.02)   . Symptomatic menopausal or female climacteric states   . Hyperlipidemia   . Osteoarthrosis, unspecified whether generalized or localized, unspecified site   . Anxiety state, unspecified   . Hypertension   . Syncope and collapse     11/12: Holter 12/12 with rare PACS, HR range 64-140, average 82, no significant arrhythmias. Echo (1/13): EF 50-55%, mild LVH, septal-lateral dyssynchrony c/w LBBB. 3-week event monitor (1/13): No significant arrhythmia.   Marland Kitchen LBBB (left bundle branch block)     LHC in 2002 showed normal coronaries.     Past Surgical History  Procedure Laterality Date  . Tubal ligation      There were no vitals filed for this visit.  Visit Diagnosis:  Abnormality of gait  Generalized muscle weakness      Subjective Assessment - 03/01/15 0852    Subjective The patient reports her meds were not changed at all at cardiology clinic.  She will get abdominal binder.   Patient Stated Goals Be able to get up and walk alone.  Also wants to return to driving and exercising.  (enjoys spin class at [employer])   Currently in  Pain? No/denies      NEUROMUSCULAR RE-EDUCATION: Tandem gait tasks with SBA to CGA Braiding x 10 feet x 6 reps with CGA to min A due to loss of balance Foam standing with head turns horizontal/vertical and then eyes closed >progressed from feet apart to feet together Sidestepping along countertop without UE support  Gait: Treadmill up to 1.8 mph with UE support x 4 minutes encouraging longer stride length with CGA for support  Dynamic gait activities including: Marching, marching with ball toss, direction changes forward/backwards walking, speed changes slow/fast, toe walking, heel walking, 180 degree turns during gait, head turns.  All with SBA to CGA x 15 minutes during PT  THERAPEUTIC EXERCISE: Standing heel raises Single limb heel raises on one leg R and L sides x 10 reps each Bilateral standing toe raises x 20 reps decreasing UE support.        PT Short Term Goals - 02/26/15 1049    PT SHORT TERM GOAL #1   Title The patient will be indep with HEP for LE strenegthening and general mobility.   Baseline Met on 02/20/2015   Time 4   Period Weeks   Status Achieved   PT SHORT TERM GOAL #2   Title The patient will move sit<>stand x 5 reps (resting in between due to orthostasis) with UE support modified indep to demo improved mobility.   Baseline Met on  02/20/2015   Time 4   Period Weeks   Status Achieved   PT SHORT TERM GOAL #3   Title The patient will improve gait speed from 1.65 ft/sec to > or equal to 2.0 ft/sec to demo decreased risk for falls.   Baseline Patient ambulates 3.5 ft/sec   Time 4   Period Weeks   Status Achieved   PT SHORT TERM GOAL #4   Title The patient will negotiate 4 steps with one handrail modified indep with reciprocal pattern.   Baseline Patient performs with one rail mod indep and with supervision with no rails.   Time 4   Period Weeks   Status Achieved   PT SHORT TERM GOAL #5   Title The patient will improve Berg score from 38/56 to > or equal  to 44/56 to demo decreasing risk for falls.   Baseline Scores 53/56 on 02/26/2015   Time 4   Period Weeks   Status Achieved           PT Long Term Goals - 02/26/15 1051    PT LONG TERM GOAL #1   Title The patient will verbalize understanding of return to community exercise.   Baseline Target date 04/11/2015   Time 8   Period Weeks   Status On-going   PT LONG TERM GOAL #2   Title The patient will improve neuro QOL (from 40%) by 15% to demo improving quality of life/subjective measures.   Baseline Target date 04/11/2015   Time 8   Period Weeks   Status On-going   PT LONG TERM GOAL #3   Title The patient will improve ait speed from 1.65 ft/sec to > or equal to 2.4 ft/sec to demo improving confidence with ambulation.   Baseline Target date 04/11/2015   Time 8   Period Weeks   Status Achieved   PT LONG TERM GOAL #4   Title The patient will improve stair negotiation x 4 steps without a handrail independently.   Baseline Target date 04/11/2015   Time 8   Period Weeks   Status On-going   PT LONG TERM GOAL #5   Title The patient will improve Berg score from 38/56 to > or equal to 47/56.   Baseline Target date 04/11/2015   Time 8   Period Weeks   Status Achieved               Plan - 03/01/15 6294    Clinical Impression Statement The patient is ambulating without device into clinic today.  She discusses concern about orthostatic hypotension and potential for this to happen again.  PT educated her on CDC recommendations (increased fluid intake, avoid large meals, avoid hot showers, exercise legs when standing in one spot or sitting for long periods).  Pt is wearing compression hose and plans to obtain an abdominal binder.  PT discussed salt intake as a factor to discuss with MD for other behavioral/dietary modifications.   PT Next Visit Plan Progress HEP difficulty, balance, dynamic gait, single leg stance activities.   Consulted and Agree with Plan of Care Patient         Problem List Patient Active Problem List   Diagnosis Date Noted  . PCP NOTES >>>> 02/13/2015  . Headache due to trauma 02/05/2015  . TBI (traumatic brain injury) ([place]) 01/28/2015  . Insomnia   . Hypoproteinemia ([place])   . Essential hypertension   . Acute post-traumatic headache, not intractable   . Skull fracture ([place])   .  Vertigo due to brain injury ([place])   . Subarachnoid bleed ([place]) 01/22/2015  . SAH (subarachnoid hemorrhage) ([place]) 01/21/2015  . Annual physical exam 09/07/2014  . Orthostatic hypotension 08/30/2014  . Acromioclavicular arthrosis 03/21/2014  . Allergic rhinitis 02/27/2014  . SI (sacroiliac) joint dysfunction 02/02/2014  . Gastrocnemius strain, left 12/11/2013  . Toenail fungus 11/15/2013  . Back pain 08/22/2013  . LBBB (left bundle branch block) 05/13/2013  . Shingles outbreak 12/15/2012  . Borderline diabetes   . Essential hypertension, benign 07/14/2012  . Syncope and collapse 02/15/2011  . Paresthesia of foot 07/07/2010  . INSOMNIA, CHRONIC, MILD 01/12/2008  . OVERWEIGHT 10/12/2007  . HLD (hyperlipidemia) 02/04/2007  . OSTEOPENIA 02/04/2007  . ANXIETY 10/12/2006  . OSTEOARTHRITIS 10/12/2006    Zelma Mazariego, PT 03/01/2015, 10:03 AM  [facility] [address] [place], [place], [address] Phone: [phone]   Fax:  [phone]  Name: Mandy Durham MRN: [id number] Date of Birth: 11/27/1945

## 2015-03-02 NOTE — Progress Notes (Signed)
Patient ID: Mandy Durham, female   DOB: [birth year], 69 y.o.   MRN: [id number] PCP: Dr. Linna Darner  69 yo with history of chronic LBBB and syncope returns for cardiology followup. LBBB has been noted since at least 2000.  She had a cath in 2002 because of chest pain that showed no angiographic CAD.  I had her do an echocardiogram in 1/13 showed EF 50-55% with septal-lateral dyssynchrony consistent with LBBB.   For several years, she has had episodes of syncope and presyncope.  She tends to get lightheaded with prolonged standing or walking at times.  Occasionally, she would get severely lightheaded and pass out or nearly pass out.  All episodes seem to have been while standing and have a prodrome of lightheadedness, nausea, and warmth.  No chest pain or tachypalpitations.  She does notice that when she drinks a lot of fluid, she does not feel as lightheaded.  No exertional dyspnea.  At a prior appointment, I started her on Florinef.  She does not think this helped much.  Her supine BP runs high and she had further episodes on Florinef.  Therefore, I had her start pyridostigmine and increased to tid.  This helped considerably initially.  However, in 11/16, she was standing at her sink and felt faint.  She passed out and hit her head => small subarachnoid hemorrhage.  She did not have to have surgery.  She remains on the same dose of pyridostigmine and is wearing compression stockings daily.  No further syncope.  Labs (4/12): LDL 80, HDL 123, TSH normal Labs (11/12): K 4, creatinine 0.8 Labs (8/14): LDL 107, HDL 102 Labs (11/14): K 3.4, creatinine 0.7 Labs (5/15): K 3.8, creatinine 0.8, TSH normal Labs (3/16); K 4.1, creatinine 0.75 Labs (6/16): LDL 140, HDL 91 Labs (11/16): K 4, creatinine 0.88  PMH: 1. Depression 2. Hyperlipidemia 3. Osteoarthritis 4. LBBB since around 2000.  LHC in 2002 showed normal coronaries.   5. Syncope: Holter 12/12 with rare PACS, HR range 64-140, average 82, no significant  arrhythmias.  Echo (1/13): EF 50-55%, mild LVH, septal-lateral dyssynchrony c/w LBBB.  3-week event monitor (1/13): No significant arrhythmia.  EEG (11/14) normal.  Suspect autonomic insufficiency.  Fall in 11/16 with SAH (due to syncope). 6. H/o labyrinthitis  SH: Lives in [place], married, nonsmoker, works as an Biochemist, clinical.   FH: No CAD that she knows of.  No CHF.   ROS: All systems reviewed and negative except as per HPI.    Current Outpatient Prescriptions  Medication Sig Dispense Refill  . acetaminophen (TYLENOL) 325 MG tablet Take 2 tablets (650 mg total) by mouth 2 (two) times daily with breakfast and lunch. To help manage headaches during the day.    . ALPRAZolam (XANAX) 0.25 MG tablet Take 1-2 tablets (0.25-0.5 mg total) by mouth at bedtime as needed for sleep. 30 tablet 0  . calcium carbonate (OS-CAL) 600 MG TABS tablet Take 1,200 mg by mouth daily with breakfast.    . cholecalciferol (VITAMIN D) 1000 UNITS tablet Take 2,000 Units by mouth daily.     . meclizine (ANTIVERT) 12.5 MG tablet Take 1 tablet (12.5 mg total) by mouth 3 (three) times daily as needed for dizziness. 30 tablet 0  . metoprolol tartrate (LOPRESSOR) 25 MG tablet Take 1 tablet (25 mg total) by mouth 2 (two) times daily. 180 tablet 3  . ondansetron (ZOFRAN) 4 MG tablet Take 1 tablet (4 mg total) by mouth every 8 (eight) hours as needed for  nausea or vomiting. 40 tablet 0  . PARoxetine (PAXIL) 10 MG tablet Take 10 mg by mouth daily.    Marland Kitchen pyridostigmine (MESTINON) 60 MG tablet Take 1 tablet (60 mg total) by mouth 3 (three) times daily. 135 tablet 3  . senna-docusate (SENOKOT-S) 8.6-50 MG tablet Take 1 tablet by mouth at bedtime. As needed for constipation    . topiramate (TOPAMAX) 25 MG tablet Take 0.5 tablets (12.5 mg total) by mouth at bedtime. To help manage headaches. 15 tablet 1  . [DISCONTINUED] Estradiol (ESTRACE PO) Take by mouth daily.      . [DISCONTINUED] medroxyPROGESTERone (PROVERA) 2.5 MG tablet  Take 2.5 mg by mouth daily.       No current facility-administered medications for this visit.    BP 122/62 mmHg  Pulse 61  Ht 5\' 7"  (1.702 m)  Wt 145 lb (65.772 kg)  BMI 22.71 kg/m2 General: NAD Neck: No JVD, no thyromegaly or thyroid nodule.  Lungs: Clear to auscultation bilaterally with normal respiratory effort. CV: Nondisplaced PMI.  Heart regular S1/S2, no S3/S4, no murmur.  No peripheral edema.  No carotid bruit.  Normal pedal pulses.  Abdomen: Soft, nontender, no hepatosplenomegaly, no distention.  Neurologic: Alert and oriented x 3.  Psych: Normal affect. Extremities: No clubbing or cyanosis.   Assessment/Plan: 1. Lightheadedness: Always when standing, brief in duration.  I suspect that these spells represent autonomic insufficiency/orthostatic hypotension.  Past event monitoring has shown no arrhythmia.  Florinef did not help much and BP is elevated.  Pyridostigmine has worked well, but had another syncopal episode in 11/16 with fall and subarachnoid hemorrhage.  - Continue pyridostigmine 60 mg tid.  - I would like her to try wearing an abdominal binder during the day.  If she has further events with this on, we may have to try a low dose of midodrine.  2. LBBB: Chronic.  No ischemic symptoms.  3. Supine HTN with orthostatic hypotension: She has been on metoprolol 25 mg bid.   Followup in 1 month with PA, 3 months with me.   Loralie Champagne 03/02/2015

## 2015-03-04 ENCOUNTER — Ambulatory Visit: Payer: Medicare HMO | Admitting: Physical Therapy

## 2015-03-04 ENCOUNTER — Ambulatory Visit: Payer: Medicare HMO

## 2015-03-04 ENCOUNTER — Encounter: Payer: Self-pay | Admitting: Physical Therapy

## 2015-03-04 DIAGNOSIS — M6281 Muscle weakness (generalized): Secondary | ICD-10-CM

## 2015-03-04 DIAGNOSIS — R269 Unspecified abnormalities of gait and mobility: Secondary | ICD-10-CM | POA: Diagnosis not present

## 2015-03-04 DIAGNOSIS — R41841 Cognitive communication deficit: Secondary | ICD-10-CM

## 2015-03-04 DIAGNOSIS — S069X1S Unspecified intracranial injury with loss of consciousness of 30 minutes or less, sequela: Secondary | ICD-10-CM

## 2015-03-04 NOTE — Therapy (Signed)
[facility] [address] [place], [place], [address] Phone: [phone]   Fax:  [phone]  Physical Therapy Treatment  Patient Details  Name: Mandy Durham MRN: [id number] Date of Birth: 10/28/1945 Referring Provider: Dr. Alger Simons  Encounter Date: 03/04/2015      PT End of Session - 03/04/15 1015    Visit Number 6   Number of Visits 16   Date for PT Re-Evaluation 04/11/15   Authorization Type G code every 10th visit, $40 copay   PT Start Time 0933   PT Stop Time 1015   PT Time Calculation (min) 42 min   Equipment Utilized During Treatment Gait belt   Activity Tolerance Patient tolerated treatment well   Behavior During Therapy [facility] for tasks assessed/performed      Past Medical History  Diagnosis Date  . Insomnia   . Adenomatous polyp of colon   . Overweight(278.02)   . Symptomatic menopausal or female climacteric states   . Hyperlipidemia   . Osteoarthrosis, unspecified whether generalized or localized, unspecified site   . Anxiety state, unspecified   . Hypertension   . Syncope and collapse     11/12: Holter 12/12 with rare PACS, HR range 64-140, average 82, no significant arrhythmias. Echo (1/13): EF 50-55%, mild LVH, septal-lateral dyssynchrony c/w LBBB. 3-week event monitor (1/13): No significant arrhythmia.   Marland Kitchen LBBB (left bundle branch block)     LHC in 2002 showed normal coronaries.     Past Surgical History  Procedure Laterality Date  . Tubal ligation      There were no vitals filed for this visit.  Visit Diagnosis:  Abnormality of gait  Generalized muscle weakness  TBI (traumatic brain injury), with loss of consciousness of 30 minutes or less, sequela (HCC)      Subjective Assessment - 03/04/15 0936    Subjective No falls. She had dizziness when fatigued.   Currently in Pain? No/denies                         [facility] Adult PT Treatment/Exercise - 03/04/15 0930     Ambulation/Gait   Ambulation/Gait Yes   Ambulation/Gait Assistance 6: Modified independent (Device/Increase time)   Ambulation Distance (Feet) 500 Feet   Assistive device None   Ambulation Surface Indoor;Level   Stairs Yes   Stairs Assistance 6: Modified independent (Device/Increase time)   Stair Management Technique One rail Left;Alternating pattern;Forwards   Number of Stairs 4   Ramp 5: Supervision   Ramp Details (indicate cue type and reason) balance activities facing uphill & downhill alternate stepping and marching with head turns with tactile cues for balance reactions on comliant surface   Curb 5: Supervision  no device   Gait Comments Increasing velocity with quicker & longer steps   High Level Balance   High Level Balance Activities Side stepping;Braiding;Backward walking;Direction changes;Turns;Sudden stops;Head turns;Tandem walking;Marching forwards;Marching backwards;Figure 8 turns;Negotitating around obstacles;Negotiating over obstacles                  PT Short Term Goals - 02/26/15 1049    PT SHORT TERM GOAL #1   Title The patient will be indep with HEP for LE strenegthening and general mobility.   Baseline Met on 02/20/2015   Time 4   Period Weeks   Status Achieved   PT SHORT TERM GOAL #2   Title The patient will move sit<>stand x 5 reps (resting in between due to orthostasis)  with UE support modified indep to demo improved mobility.   Baseline Met on 02/20/2015   Time 4   Period Weeks   Status Achieved   PT SHORT TERM GOAL #3   Title The patient will improve gait speed from 1.65 ft/sec to > or equal to 2.0 ft/sec to demo decreased risk for falls.   Baseline Patient ambulates 3.5 ft/sec   Time 4   Period Weeks   Status Achieved   PT SHORT TERM GOAL #4   Title The patient will negotiate 4 steps with one handrail modified indep with reciprocal pattern.   Baseline Patient performs with one rail mod indep and with supervision with no rails.   Time 4    Period Weeks   Status Achieved   PT SHORT TERM GOAL #5   Title The patient will improve Berg score from 38/56 to > or equal to 44/56 to demo decreasing risk for falls.   Baseline Scores 53/56 on 02/26/2015   Time 4   Period Weeks   Status Achieved           PT Long Term Goals - 02/26/15 1051    PT LONG TERM GOAL #1   Title The patient will verbalize understanding of return to community exercise.   Baseline Target date 04/11/2015   Time 8   Period Weeks   Status On-going   PT LONG TERM GOAL #2   Title The patient will improve neuro QOL (from 40%) by 15% to demo improving quality of life/subjective measures.   Baseline Target date 04/11/2015   Time 8   Period Weeks   Status On-going   PT LONG TERM GOAL #3   Title The patient will improve ait speed from 1.65 ft/sec to > or equal to 2.4 ft/sec to demo improving confidence with ambulation.   Baseline Target date 04/11/2015   Time 8   Period Weeks   Status Achieved   PT LONG TERM GOAL #4   Title The patient will improve stair negotiation x 4 steps without a handrail independently.   Baseline Target date 04/11/2015   Time 8   Period Weeks   Status On-going   PT LONG TERM GOAL #5   Title The patient will improve Berg score from 38/56 to > or equal to 47/56.   Baseline Target date 04/11/2015   Time 8   Period Weeks   Status Achieved               Plan - 03/04/15 1015    Clinical Impression Statement Patient brought abdominal binder that she purchased and PT donned with cues on position & correct size. Patient improved balance reactions with neuromuscular re-education with tactile cues & verbal cues with repetition to learn movements.   Pt will benefit from skilled therapeutic intervention in order to improve on the following deficits Abnormal gait;Decreased activity tolerance;Decreased balance;Decreased mobility;Decreased strength;Difficulty walking;Dizziness;Pain;Decreased endurance   Rehab Potential Good   PT  Frequency 2x / week   PT Duration 8 weeks   PT Treatment/Interventions ADLs/Self Care Home Management;Therapeutic exercise;Therapeutic activities;Gait training;Neuromuscular re-education;Canalith Repostioning;Vestibular;Patient/family education   PT Next Visit Plan Progress HEP difficulty, balance, dynamic gait, single leg stance activities.   Consulted and Agree with Plan of Care Patient        Problem List Patient Active Problem List   Diagnosis Date Noted  . PCP NOTES >>>> 02/13/2015  . Headache due to trauma 02/05/2015  . TBI (traumatic brain injury) ([place]) 01/28/2015  . Insomnia   .  Hypoproteinemia ([place])   . Essential hypertension   . Acute post-traumatic headache, not intractable   . Skull fracture ([place])   . Vertigo due to brain injury ([place])   . Subarachnoid bleed ([place]) 01/22/2015  . SAH (subarachnoid hemorrhage) ([place]) 01/21/2015  . Annual physical exam 09/07/2014  . Orthostatic hypotension 08/30/2014  . Acromioclavicular arthrosis 03/21/2014  . Allergic rhinitis 02/27/2014  . SI (sacroiliac) joint dysfunction 02/02/2014  . Gastrocnemius strain, left 12/11/2013  . Toenail fungus 11/15/2013  . Back pain 08/22/2013  . LBBB (left bundle branch block) 05/13/2013  . Shingles outbreak 12/15/2012  . Borderline diabetes   . Essential hypertension, benign 07/14/2012  . Syncope and collapse 02/15/2011  . Paresthesia of foot 07/07/2010  . INSOMNIA, CHRONIC, MILD 01/12/2008  . OVERWEIGHT 10/12/2007  . HLD (hyperlipidemia) 02/04/2007  . OSTEOPENIA 02/04/2007  . ANXIETY 10/12/2006  . OSTEOARTHRITIS 10/12/2006    Perle Gibbon PT, DPT 03/04/2015, 9:13 PM  [facility] [address] [place], [place], [address] Phone: [phone]   Fax:  [phone]  Name: Micheala TRESIA REVOLORIO MRN: [id number] Date of Birth: [birth year]

## 2015-03-04 NOTE — Therapy (Signed)
[facility] [address] [place], [place], [address] Phone: [phone]   Fax:  [phone]  Speech Language Pathology Treatment  Patient Details  Name: Mandy Durham MRN: [id number] Date of Birth: 1945-08-14 Referring Provider: Alger Simons  Encounter Date: 03/04/2015      End of Session - 03/04/15 1159    Visit Number 2   Number of Visits 17   Date for SLP Re-Evaluation 04/23/15   SLP Start Time 0933   SLP Stop Time  [id number]   SLP Time Calculation (min) 42 min   Activity Tolerance Patient tolerated treatment well      Past Medical History  Diagnosis Date  . Insomnia   . Adenomatous polyp of colon   . Overweight(278.02)   . Symptomatic menopausal or female climacteric states   . Hyperlipidemia   . Osteoarthrosis, unspecified whether generalized or localized, unspecified site   . Anxiety state, unspecified   . Hypertension   . Syncope and collapse     11/12: Holter 12/12 with rare PACS, HR range 64-140, average 82, no significant arrhythmias. Echo (1/13): EF 50-55%, mild LVH, septal-lateral dyssynchrony c/w LBBB. 3-week event monitor (1/13): No significant arrhythmia.   Marland Kitchen LBBB (left bundle branch block)     LHC in 2002 showed normal coronaries.     Past Surgical History  Procedure Laterality Date  . Tubal ligation      There were no vitals filed for this visit.  Visit Diagnosis: Cognitive communication deficit             ADULT SLP TREATMENT - 03/04/15 0855    General Information   Behavior/Cognition Alert;Cooperative;Pleasant mood   Treatment Provided   Treatment provided Cognitive-Linquistic   Pain Assessment   Pain Assessment No/denies pain   Cognitive-Linquistic Treatment   Treatment focused on Aphasia;Cognition   Skilled Treatment Speech tx<30 minutes: Pt copmleted simple divergent naming tasks with average 5 items prior to needing cues. Semantic cues proved most successful.  Cognitive Skills (25 min): SLP reviewed memory strategies with pt. She states she is tracking her own meds at this time with a med box, and uses a day timer for appointments. She needed assistance today finding online puzzles so SLP helped pt do so. She took notes on what to search for, spontaneously.    Assessment / Recommendations / Plan   Plan Continue with current plan of care   Progression Toward Goals   Progression toward goals Progressing toward goals          SLP Education - 03/04/15 0936    Education provided Yes   Education Details memory strategies   Person(s) Educated Patient   Methods Explanation;Handout;Demonstration   Comprehension Verbalized understanding;Need further instruction          SLP Short Term Goals - 03/04/15 1201    SLP SHORT TERM GOAL #1   Title Pt will utlize external aids for schedule and medication management over 3 sessions with rare min A   Time 4   Period Weeks   Status On-going   SLP SHORT TERM GOAL #2   Title Pt will solve mildly complex reasoning, time and money problems with 80% accuracy and rare min A   Time 4   Period Weeks   Status On-going   SLP SHORT TERM GOAL #3   Title Pt will alternate attention between 2 simple cognitive linguistic tasks with 85% on each and occasional min A   Time 4   Period Weeks  Status On-going   SLP SHORT TERM GOAL #4   Title Pt will name 8 items for a given category with rare min A   Time 4   Period Weeks   Status On-going          SLP Long Term Goals - 03/04/15 1202    SLP LONG TERM GOAL #1   Title Pt will perform mildly complex financial management tasks with 90% accuracy and rare min A   Time 8   Period Weeks   Status On-going   SLP LONG TERM GOAL #2   Title Pt will solve mildly complex reasoning, deduction, inference problems with 85% accuracy and rare min A   Time 8   Period Weeks   Status On-going   SLP LONG TERM GOAL #3   Title Pt will divide attention between 2 simple cognitive  linguistic tasks with 85% on each and rare min A   Time 8   Period Weeks   Status On-going          Plan - 03/04/15 1200    Clinical Impression Statement Pt completed simple divergent naming tasks with SLP cues necessary. Additionally, pt was educated re: websites to navigate to for cognitive-linguistic puzzles. She wrote down sites/searches and pertinent information 2/3 today.    Speech Therapy Frequency 2x / week   Duration --  8 weeks   Treatment/Interventions SLP instruction and feedback;Compensatory strategies;Functional tasks;Patient/family education;Language facilitation;Cognitive reorganization   Potential to Achieve Goals Good   Potential Considerations Ability to learn/carryover information   Consulted and Agree with Plan of Care Patient        Problem List Patient Active Problem List   Diagnosis Date Noted  . PCP NOTES >>>> 02/13/2015  . Headache due to trauma 02/05/2015  . TBI (traumatic brain injury) ([place]) 01/28/2015  . Insomnia   . Hypoproteinemia ([place])   . Essential hypertension   . Acute post-traumatic headache, not intractable   . Skull fracture ([place])   . Vertigo due to brain injury ([place])   . Subarachnoid bleed ([place]) 01/22/2015  . SAH (subarachnoid hemorrhage) ([place]) 01/21/2015  . Annual physical exam 09/07/2014  . Orthostatic hypotension 08/30/2014  . Acromioclavicular arthrosis 03/21/2014  . Allergic rhinitis 02/27/2014  . SI (sacroiliac) joint dysfunction 02/02/2014  . Gastrocnemius strain, left 12/11/2013  . Toenail fungus 11/15/2013  . Back pain 08/22/2013  . LBBB (left bundle branch block) 05/13/2013  . Shingles outbreak 12/15/2012  . Borderline diabetes   . Essential hypertension, benign 07/14/2012  . Syncope and collapse 02/15/2011  . Paresthesia of foot 07/07/2010  . INSOMNIA, CHRONIC, MILD 01/12/2008  . OVERWEIGHT 10/12/2007  . HLD (hyperlipidemia) 02/04/2007  . OSTEOPENIA 02/04/2007  . ANXIETY 10/12/2006  . OSTEOARTHRITIS 10/12/2006     [place] , [place], CCC-SLP  03/04/2015, 12:02 PM  [facility] [address] [place], [place], [address] Phone: [phone]   Fax:  [phone]   Name: Mandy Durham MRN: [id number] Date of Birth: 04-10-1945

## 2015-03-04 NOTE — Patient Instructions (Signed)
Look online for crossword puzzles or jigsaw puzzles.  Stop by the dollar store and buy a book of crosswords.

## 2015-03-05 ENCOUNTER — Other Ambulatory Visit: Payer: Self-pay | Admitting: Internal Medicine

## 2015-03-06 ENCOUNTER — Ambulatory Visit
Admission: RE | Admit: 2015-03-06 | Discharge: 2015-03-06 | Disposition: A | Discharge status: Home / Self Care | Payer: Medicare HMO | Source: Ambulatory Visit | Attending: Neurosurgery | Admitting: Neurosurgery

## 2015-03-06 DIAGNOSIS — I609 Nontraumatic subarachnoid hemorrhage, unspecified: Secondary | ICD-10-CM

## 2015-03-06 NOTE — Telephone Encounter (Signed)
Rx faxed to [facility]  

## 2015-03-06 NOTE — Telephone Encounter (Signed)
Pt is requesting refill on Alprazolam.  Last OV: 02/12/2015 Last Fill: 02/12/2015 #30 and 0RF Pt sig: 1-2 tablets qhs PRN UDS: None  Will need contract and UDS at next OV.  Please advise.

## 2015-03-06 NOTE — Telephone Encounter (Signed)
Okay #60, 1 RF  

## 2015-03-06 NOTE — Telephone Encounter (Signed)
Rx printed, awaiting MD signature.  

## 2015-03-07 ENCOUNTER — Ambulatory Visit: Payer: Medicare HMO

## 2015-03-07 ENCOUNTER — Ambulatory Visit: Payer: Medicare HMO | Admitting: Rehabilitative and Restorative Service Providers"

## 2015-03-07 DIAGNOSIS — M6281 Muscle weakness (generalized): Secondary | ICD-10-CM

## 2015-03-07 DIAGNOSIS — R41841 Cognitive communication deficit: Secondary | ICD-10-CM

## 2015-03-07 DIAGNOSIS — R269 Unspecified abnormalities of gait and mobility: Secondary | ICD-10-CM

## 2015-03-07 NOTE — Therapy (Signed)
[facility] [address] [place], [place], [address] Phone: [phone]   Fax:  [phone]  Speech Language Pathology Treatment  Patient Details  Name: Mandy Durham MRN: [id number] Date of Birth: [birth year] Referring Provider: Alger Simons  Encounter Date: 03/07/2015      End of Session - 03/07/15 1020    Visit Number 3   Number of Visits 17   Date for SLP Re-Evaluation 04/23/15   SLP Start Time 0935   SLP Stop Time  1017   SLP Time Calculation (min) 42 min   Activity Tolerance Patient tolerated treatment well      Past Medical History  Diagnosis Date  . Insomnia   . Adenomatous polyp of colon   . Overweight(278.02)   . Symptomatic menopausal or female climacteric states   . Hyperlipidemia   . Osteoarthrosis, unspecified whether generalized or localized, unspecified site   . Anxiety state, unspecified   . Hypertension   . Syncope and collapse     11/12: Holter 12/12 with rare PACS, HR range 64-140, average 82, no significant arrhythmias. Echo (1/13): EF 50-55%, mild LVH, septal-lateral dyssynchrony c/w LBBB. 3-week event monitor (1/13): No significant arrhythmia.   Marland Kitchen LBBB (left bundle branch block)     LHC in 2002 showed normal coronaries.     Past Surgical History  Procedure Laterality Date  . Tubal ligation      There were no vitals filed for this visit.  Visit Diagnosis: Cognitive communication deficit      Subjective Assessment - 03/07/15 0938    Subjective "We talked about WARM and you said to keep writing things down because I had done it before."               ADULT SLP TREATMENT - 03/07/15 0940    General Information   Behavior/Cognition Alert;Cooperative;Pleasant mood   Treatment Provided   Treatment provided Cognitive-Linquistic   Pain Assessment   Pain Assessment No/denies pain   Cognitive-Linquistic Treatment   Treatment focused on Aphasia;Cognition   Skilled  Treatment Speech tx<30 minutes: Pt copmleted simple responsive naming tasks with 90% success. Pt named occupations with 80% success and req'd occasional min verbal cues. Semantic cues proved most successful. Cognitive Skills (22 min): SLP reviewed memory strategies with pt. due to her recalling 1/4. SLP gave examples of each for pt.After 9 minutes she recalled 4/4 strategies.   Assessment / Recommendations / Plan   Plan Continue with current plan of care   Progression Toward Goals   Progression toward goals Progressing toward goals          SLP Education - 03/07/15 1020    Education provided Yes   Education Details Memory strategies   Person(s) Educated Patient   Methods Explanation;Demonstration   Comprehension Verbalized understanding;Verbal cues required          SLP Short Term Goals - 03/07/15 1022    SLP SHORT TERM GOAL #1   Title Pt will utlize external aids for schedule and medication management over 3 sessions with rare min A   Time 4   Period Weeks   Status On-going   SLP SHORT TERM GOAL #2   Title Pt will solve mildly complex reasoning, time and money problems with 80% accuracy and rare min A   Time 4   Period Weeks   Status On-going   SLP SHORT TERM GOAL #3   Title Pt will alternate attention between 2 simple cognitive linguistic tasks with 85%  on each and occasional min A   Time 4   Period Weeks   Status On-going   SLP SHORT TERM GOAL #4   Title Pt will name 8 items for a given category with rare min A   Time 4   Period Weeks   Status On-going          SLP Long Term Goals - 03/07/15 1023    SLP LONG TERM GOAL #1   Title Pt will perform mildly complex financial management tasks with 90% accuracy and rare min A   Time 8   Period Weeks   Status On-going   SLP LONG TERM GOAL #2   Title Pt will solve mildly complex reasoning, deduction, inference problems with 85% accuracy and rare min A   Time 8   Period Weeks   Status On-going   SLP LONG TERM GOAL #3    Title Pt will divide attention between 2 simple cognitive linguistic tasks with 85% on each and rare min A   Time 8   Period Weeks   Status On-going          Plan - 03/07/15 1021    Clinical Impression Statement Pt completed simple responsive naming tasks with mostly good success. She cont to exhibit cognitive communication deficits and aphasia. Skilled ST remains necessary to maximize pt ability in these areas to cont to incr pt's independence.   Speech Therapy Frequency 2x / week   Duration --  8 weeks   Treatment/Interventions SLP instruction and feedback;Compensatory strategies;Functional tasks;Patient/family education;Language facilitation;Cognitive reorganization   Potential to Achieve Goals Good   Potential Considerations Ability to learn/carryover information   Consulted and Agree with Plan of Care Patient        Problem List Patient Active Problem List   Diagnosis Date Noted  . PCP NOTES >>>> 02/13/2015  . Headache due to trauma 02/05/2015  . TBI (traumatic brain injury) ([place]) 01/28/2015  . Insomnia   . Hypoproteinemia ([place])   . Essential hypertension   . Acute post-traumatic headache, not intractable   . Skull fracture ([place])   . Vertigo due to brain injury ([place])   . Subarachnoid bleed ([place]) 01/22/2015  . SAH (subarachnoid hemorrhage) ([place]) 01/21/2015  . Annual physical exam 09/07/2014  . Orthostatic hypotension 08/30/2014  . Acromioclavicular arthrosis 03/21/2014  . Allergic rhinitis 02/27/2014  . SI (sacroiliac) joint dysfunction 02/02/2014  . Gastrocnemius strain, left 12/11/2013  . Toenail fungus 11/15/2013  . Back pain 08/22/2013  . LBBB (left bundle branch block) 05/13/2013  . Shingles outbreak 12/15/2012  . Borderline diabetes   . Essential hypertension, benign 07/14/2012  . Syncope and collapse 02/15/2011  . Paresthesia of foot 07/07/2010  . INSOMNIA, CHRONIC, MILD 01/12/2008  . OVERWEIGHT 10/12/2007  . HLD (hyperlipidemia) 02/04/2007  .  OSTEOPENIA 02/04/2007  . ANXIETY 10/12/2006  . OSTEOARTHRITIS 10/12/2006    [place] , [place], CCC-SLP 03/07/2015, 10:23 AM  [facility] [address] [place], [place], [address] Phone: [phone]   Fax:  [phone]   Name: Mandy Durham MRN: [id number] Date of Birth: 07-16-1945

## 2015-03-07 NOTE — Patient Instructions (Signed)
Heel Raise: Unilateral (Standing)    Balance on left foot, then rise on ball of foot.  Hold onto countertop fo rsupport. Repeat _10___ times per set. Do _1___ sets per session. Do _2___ sessions per day.  http://orth.exer.us/41   Copyright  VHI. All rights reserved.    Toe Raise (Standing)     NEAR A COUNTERTOP/HOLD ON FOR SUPPORT. Rock back on heels. Repeat __20__ times per set. Do _1___ sets per session. Do __2__ sessions per day.  http://orth.exer.us/43   Copyright  VHI. All rights reserved.  "I love a Parade" Lift    Using a chair if necessary, march in place x 10 times on each leg.  Do 2 sessions per day. http://gt2.exer.us/345   Copyright  VHI. All rights reserved.  Feet Partial Heel-Toe, Head Motion - Eyes Open    With eyes open, right foot partially in front of the other, move head slowly: up and down. Switch feet and do side to side. Repeat _5___ times.  Rest and repeat. Do __2__ sessions per day.  Copyright  VHI. All rights reserved.   Feet Together (Compliant Surface) Varied Arm Positions - Eyes Closed    Stand on compliant surface: __pillow______ with feet together and arms out. Close eyes and visualize upright position. Hold__30__ seconds. Repeat __3__ times per session. Do __2__ sessions per day.  Copyright  VHI. All rights reserved.      Gaze Stabilization: Tip Card 1.Target must remain in focus, not blurry, and appear stationary while head is in motion. 2.Perform exercises with small head movements (45 to either side of midline). 3.Increase speed of head motion so long as target is in focus. 4.If you wear eyeglasses, be sure you can see target through lens (therapist will give specific instructions for bifocal / progressive lenses). 5.These exercises may provoke dizziness or nausea. Work through these symptoms. If too dizzy, slow head movement slightly. Rest between each exercise. 6.Exercises demand concentration; avoid  distractions. 7.For safety, perform standing exercises close to a counter, wall, corner, or next to someone.  Copyright  VHI. All rights reserved.  Gaze Stabilization: Standing Feet Apart   Feet shoulder width apart, keeping eyes on target on wall 3 feet away, tilt head down slightly and move head side to side for 30 seconds. Do 2 sessions per day.   Copyright  VHI. All rights reserved.

## 2015-03-07 NOTE — Patient Instructions (Signed)
  Please complete the assigned speech therapy homework for about 15-20 minutes per day, until your next session.

## 2015-03-07 NOTE — Therapy (Signed)
[facility] [address] [place], [place], [address] Phone: [phone]   Fax:  [phone]  Physical Therapy Treatment  Patient Details  Name: Mandy Durham MRN: [id number] Date of Birth: 16-Aug-1945 Referring Provider: Dr. Alger Simons  Encounter Date: 03/07/2015      PT End of Session - 03/07/15 1300    Visit Number 7   Number of Visits 16   Date for PT Re-Evaluation 04/11/15   Authorization Type G code every 10th visit, $40 copay   PT Start Time 0850   PT Stop Time 0930   PT Time Calculation (min) 40 min   Equipment Utilized During Treatment Gait belt   Activity Tolerance Patient tolerated treatment well   Behavior During Therapy [facility] for tasks assessed/performed      Past Medical History  Diagnosis Date  . Insomnia   . Adenomatous polyp of colon   . Overweight(278.02)   . Symptomatic menopausal or female climacteric states   . Hyperlipidemia   . Osteoarthrosis, unspecified whether generalized or localized, unspecified site   . Anxiety state, unspecified   . Hypertension   . Syncope and collapse     11/12: Holter 12/12 with rare PACS, HR range 64-140, average 82, no significant arrhythmias. Echo (1/13): EF 50-55%, mild LVH, septal-lateral dyssynchrony c/w LBBB. 3-week event monitor (1/13): No significant arrhythmia.   Marland Kitchen LBBB (left bundle branch block)     LHC in 2002 showed normal coronaries.     Past Surgical History  Procedure Laterality Date  . Tubal ligation      There were no vitals filed for this visit.  Visit Diagnosis:  Abnormality of gait  Generalized muscle weakness      Subjective Assessment - 03/07/15 0848    Subjective The patient reports dizziness and headache when busy during the day.  She went to run errands yesterday and notes fatigue after being in busy environments.  She reports balance improving.  She got an abdominal brace for orthostatic hypotension.   Currently in Pain?  No/denies      NEUROMUSCULAR RE-EDUCATION: Corner balance consisting of 1/2 tandem with eyes closed, 1/2 tandem with head turns + eyes open, pillow with eyes closed, progressing to feet together on pillow with eyes closed. Gaze x 1 viewing reviewed with emphasis on increasing speed of motion and not moving shoulders during movement.   Compliant surface standing x with  Marching, marching with head turns, partial heel toe and feet together with head turns.  Tandem gait near countertop with UE support.    Gait: Gait on compliant surfaces with backwards walking, forwards walking and side-stepping.   Gait with emphasis on longer stride length and arm swing Emphasis on core posture with gait activities.       PT Education - 03/07/15 1259    Education provided Yes   Education Details HEP: single leg stance, marching, tandem with head turns, toe raises, pillow + feet together + eyes closed, gaze x 1 viewing,    Person(s) Educated Patient   Methods Explanation;Demonstration   Comprehension Verbalized understanding;Returned demonstration          PT Short Term Goals - 02/26/15 1049    PT SHORT TERM GOAL #1   Title The patient will be indep with HEP for LE strenegthening and general mobility.   Baseline Met on 02/20/2015   Time 4   Period Weeks   Status Achieved   PT SHORT TERM GOAL #2   Title The  patient will move sit<>stand x 5 reps (resting in between due to orthostasis) with UE support modified indep to demo improved mobility.   Baseline Met on 02/20/2015   Time 4   Period Weeks   Status Achieved   PT SHORT TERM GOAL #3   Title The patient will improve gait speed from 1.65 ft/sec to > or equal to 2.0 ft/sec to demo decreased risk for falls.   Baseline Patient ambulates 3.5 ft/sec   Time 4   Period Weeks   Status Achieved   PT SHORT TERM GOAL #4   Title The patient will negotiate 4 steps with one handrail modified indep with reciprocal pattern.   Baseline Patient performs  with one rail mod indep and with supervision with no rails.   Time 4   Period Weeks   Status Achieved   PT SHORT TERM GOAL #5   Title The patient will improve Berg score from 38/56 to > or equal to 44/56 to demo decreasing risk for falls.   Baseline Scores 53/56 on 02/26/2015   Time 4   Period Weeks   Status Achieved           PT Long Term Goals - 02/26/15 1051    PT LONG TERM GOAL #1   Title The patient will verbalize understanding of return to community exercise.   Baseline Target date 04/11/2015   Time 8   Period Weeks   Status On-going   PT LONG TERM GOAL #2   Title The patient will improve neuro QOL (from 40%) by 15% to demo improving quality of life/subjective measures.   Baseline Target date 04/11/2015   Time 8   Period Weeks   Status On-going   PT LONG TERM GOAL #3   Title The patient will improve ait speed from 1.65 ft/sec to > or equal to 2.4 ft/sec to demo improving confidence with ambulation.   Baseline Target date 04/11/2015   Time 8   Period Weeks   Status Achieved   PT LONG TERM GOAL #4   Title The patient will improve stair negotiation x 4 steps without a handrail independently.   Baseline Target date 04/11/2015   Time 8   Period Weeks   Status On-going   PT LONG TERM GOAL #5   Title The patient will improve Berg score from 38/56 to > or equal to 47/56.   Baseline Target date 04/11/2015   Time 8   Period Weeks   Status Achieved               Plan - 03/07/15 1300    Clinical Impression Statement The patient was progressing well with current HEP, PT progressed to be more challenging adding compliant surfaces and narrowing base of support.   PT Next Visit Plan Progress HEP difficulty, balance, dynamic gait, single leg stance activities.   Consulted and Agree with Plan of Care Patient        Problem List Patient Active Problem List   Diagnosis Date Noted  . PCP NOTES >>>> 02/13/2015  . Headache due to trauma 02/05/2015  . TBI (traumatic  brain injury) ([place]) 01/28/2015  . Insomnia   . Hypoproteinemia ([place])   . Essential hypertension   . Acute post-traumatic headache, not intractable   . Skull fracture ([place])   . Vertigo due to brain injury ([place])   . Subarachnoid bleed ([place]) 01/22/2015  . SAH (subarachnoid hemorrhage) ([place]) 01/21/2015  . Annual physical exam 09/07/2014  . Orthostatic hypotension 08/30/2014  .  Acromioclavicular arthrosis 03/21/2014  . Allergic rhinitis 02/27/2014  . SI (sacroiliac) joint dysfunction 02/02/2014  . Gastrocnemius strain, left 12/11/2013  . Toenail fungus 11/15/2013  . Back pain 08/22/2013  . LBBB (left bundle branch block) 05/13/2013  . Shingles outbreak 12/15/2012  . Borderline diabetes   . Essential hypertension, benign 07/14/2012  . Syncope and collapse 02/15/2011  . Paresthesia of foot 07/07/2010  . INSOMNIA, CHRONIC, MILD 01/12/2008  . OVERWEIGHT 10/12/2007  . HLD (hyperlipidemia) 02/04/2007  . OSTEOPENIA 02/04/2007  . ANXIETY 10/12/2006  . OSTEOARTHRITIS 10/12/2006    [name] Gatchel, PT 03/07/2015, 1:03 PM  [facility] [address] [place], [place], [address] Phone: [phone]   Fax:  [phone]  Name: Jacques TIASIA WEBERG MRN: [id number] Date of Birth: July 01, 1945

## 2015-03-12 ENCOUNTER — Ambulatory Visit: Payer: Medicare HMO | Admitting: Speech Pathology

## 2015-03-12 ENCOUNTER — Ambulatory Visit: Payer: Medicare HMO | Admitting: Rehabilitative and Restorative Service Providers"

## 2015-03-12 DIAGNOSIS — S0190XA Unspecified open wound of unspecified part of head, initial encounter: Secondary | ICD-10-CM

## 2015-03-12 DIAGNOSIS — R41841 Cognitive communication deficit: Secondary | ICD-10-CM

## 2015-03-12 DIAGNOSIS — M6281 Muscle weakness (generalized): Secondary | ICD-10-CM

## 2015-03-12 DIAGNOSIS — R269 Unspecified abnormalities of gait and mobility: Secondary | ICD-10-CM

## 2015-03-12 DIAGNOSIS — S069X9A Unspecified intracranial injury with loss of consciousness of unspecified duration, initial encounter: Secondary | ICD-10-CM

## 2015-03-12 DIAGNOSIS — R4701 Aphasia: Secondary | ICD-10-CM

## 2015-03-12 NOTE — Therapy (Signed)
[facility] [address] [place], [place], [address] Phone: [phone]   Fax:  [phone]  Speech Language Pathology Treatment  Patient Details  Name: Mandy Durham MRN: [id number] Date of Birth: 09-Feb-1946 Referring Provider: Alger Simons  Encounter Date: 03/12/2015      End of Session - 03/12/15 1105    Visit Number 4   Number of Visits 17   SLP Start Time [id number]   SLP Stop Time  1102   SLP Time Calculation (min) 46 min      Past Medical History  Diagnosis Date  . Insomnia   . Adenomatous polyp of colon   . Overweight(278.02)   . Symptomatic menopausal or female climacteric states   . Hyperlipidemia   . Osteoarthrosis, unspecified whether generalized or localized, unspecified site   . Anxiety state, unspecified   . Hypertension   . Syncope and collapse     11/12: Holter 12/12 with rare PACS, HR range 64-140, average 82, no significant arrhythmias. Echo (1/13): EF 50-55%, mild LVH, septal-lateral dyssynchrony c/w LBBB. 3-week event monitor (1/13): No significant arrhythmia.   Marland Kitchen LBBB (left bundle branch block)     LHC in 2002 showed normal coronaries.     Past Surgical History  Procedure Laterality Date  . Tubal ligation      There were no vitals filed for this visit.  Visit Diagnosis: Cognitive communication deficit  Aphasia due to open TBI (traumatic brain injury) ([place])      Subjective Assessment - 03/12/15 1018    Subjective "I didn't do any homeword b/c I was so tired from Christmas"   Currently in Pain? No/denies               ADULT SLP TREATMENT - 03/12/15 1020    General Information   Behavior/Cognition Alert;Cooperative;Pleasant mood   Treatment Provided   Treatment provided Cognitive-Linquistic   Pain Assessment   Pain Assessment No/denies pain   Cognitive-Linquistic Treatment   Treatment focused on Aphasia;Cognition   Skilled Treatment Cognitive linguistic therapy - Pt  solved simple to mildly complex financial and temporal problems with rare mn A for math, however she required usual min cues for attention to detail and to ID errors - pt. instructed on attention deficits resulting from [facility], after this, pt demonstrate some emergent awareness, reading questions twice and checking her answers. (20 min) Speech therapy naming items for a given category that start with a specific letter with extended time and usual min to mod semantic cues.   Assessment / Recommendations / Plan   Plan Continue with current plan of care   Progression Toward Goals   Progression toward goals Progressing toward goals          SLP Education - 03/12/15 1103    Education provided Yes   Education Details attention and awareness impairments, compensations for attention    Person(s) Educated Patient   Methods Explanation;Demonstration   Comprehension Verbalized understanding;Returned demonstration          SLP Short Term Goals - 03/12/15 1105    SLP SHORT TERM GOAL #1   Title Pt will utlize external aids for schedule and medication management over 3 sessions with rare min A   Time 3   Period Weeks   Status On-going   SLP SHORT TERM GOAL #2   Title Pt will solve mildly complex reasoning, time and money problems with 80% accuracy and rare min A   Time 3   Period Weeks  Status On-going   SLP SHORT TERM GOAL #3   Title Pt will alternate attention between 2 simple cognitive linguistic tasks with 85% on each and occasional min A   Time 3   Period Weeks   Status On-going   SLP SHORT TERM GOAL #4   Title Pt will name 8 items for a given category with rare min A   Time 3   Period Weeks   Status On-going          SLP Long Term Goals - 03/12/15 1105    SLP LONG TERM GOAL #1   Title Pt will perform mildly complex financial management tasks with 90% accuracy and rare min A   Time 7   Period Weeks   Status On-going   SLP LONG TERM GOAL #2   Title Pt will solve mildly complex  reasoning, deduction, inference problems with 85% accuracy and rare min A   Time 7   Period Weeks   Status On-going   SLP LONG TERM GOAL #3   Title Pt will divide attention between 2 simple cognitive linguistic tasks with 85% on each and rare min A   Time 7   Period Weeks   Status On-going          Plan - 03/12/15 1103    Clinical Impression Statement Pt required usual min to mod A for attention to details and naming tasks, she also required cues to recall her schedule. Continue skilled ST to maximize cognition and languae for improved independence and safetly   Speech Therapy Frequency 2x / week   Potential to Achieve Goals Good   Potential Considerations Ability to learn/carryover information   Consulted and Agree with Plan of Care Patient        Problem List Patient Active Problem List   Diagnosis Date Noted  . PCP NOTES >>>> 02/13/2015  . Headache due to trauma 02/05/2015  . TBI (traumatic brain injury) ([place]) 01/28/2015  . Insomnia   . Hypoproteinemia ([place])   . Essential hypertension   . Acute post-traumatic headache, not intractable   . Skull fracture ([place])   . Vertigo due to brain injury ([place])   . Subarachnoid bleed ([place]) 01/22/2015  . SAH (subarachnoid hemorrhage) ([place]) 01/21/2015  . Annual physical exam 09/07/2014  . Orthostatic hypotension 08/30/2014  . Acromioclavicular arthrosis 03/21/2014  . Allergic rhinitis 02/27/2014  . SI (sacroiliac) joint dysfunction 02/02/2014  . Gastrocnemius strain, left 12/11/2013  . Toenail fungus 11/15/2013  . Back pain 08/22/2013  . LBBB (left bundle branch block) 05/13/2013  . Shingles outbreak 12/15/2012  . Borderline diabetes   . Essential hypertension, benign 07/14/2012  . Syncope and collapse 02/15/2011  . Paresthesia of foot 07/07/2010  . INSOMNIA, CHRONIC, MILD 01/12/2008  . OVERWEIGHT 10/12/2007  . HLD (hyperlipidemia) 02/04/2007  . OSTEOPENIA 02/04/2007  . ANXIETY 10/12/2006  . OSTEOARTHRITIS 10/12/2006     Lovv[place], Annye Rusk MS, CCC-SLP 03/12/2015, 11:07 AM  [facility] [address] [place], [place], [address] Phone: [phone]   Fax:  [phone]   Name: Mandy Durham MRN: [id number] Date of Birth: 1946-03-05

## 2015-03-12 NOTE — Therapy (Signed)
[facility] [address] [place], [place], [address] Phone: [phone]   Fax:  [phone]  Physical Therapy Treatment  Patient Details  Name: Mandy Durham MRN: [id number] Date of Birth: 17-Sep-1945 Referring Provider: Dr. Alger Simons  Encounter Date: 03/12/2015      PT End of Session - 03/12/15 1113    Visit Number 8   Number of Visits 16   Date for PT Re-Evaluation 04/11/15   Authorization Type G code every 10th visit, $40 copay   PT Start Time 1108   PT Stop Time 1148   PT Time Calculation (min) 40 min   Equipment Utilized During Treatment Gait belt   Activity Tolerance Patient tolerated treatment well   Behavior During Therapy [facility] for tasks assessed/performed      Past Medical History  Diagnosis Date  . Insomnia   . Adenomatous polyp of colon   . Overweight(278.02)   . Symptomatic menopausal or female climacteric states   . Hyperlipidemia   . Osteoarthrosis, unspecified whether generalized or localized, unspecified site   . Anxiety state, unspecified   . Hypertension   . Syncope and collapse     11/12: Holter 12/12 with rare PACS, HR range 64-140, average 82, no significant arrhythmias. Echo (1/13): EF 50-55%, mild LVH, septal-lateral dyssynchrony c/w LBBB. 3-week event monitor (1/13): No significant arrhythmia.   Marland Kitchen LBBB (left bundle branch block)     LHC in 2002 showed normal coronaries.     Past Surgical History  Procedure Laterality Date  . Tubal ligation      There were no vitals filed for this visit.  Visit Diagnosis:  Abnormality of gait  Generalized muscle weakness      Subjective Assessment - 03/12/15 1110    Subjective The patient reports she feels she is functioning well at home.  She is beginning to do some housework.  She has returned to showering standing up and reports some concern for safety.   Shei s also returning to grocery shopping with help from husband.  She still gets  tired in the evening and notes some fatigue worse than prior level.   Pertinent History Intermittent h/o syncopal episodes, known to our clinic from PT for BPPV, Left bundle branch block.   Patient Stated Goals Be able to get up and walk alone.  Also wants to return to driving and exercising.  (enjoys spin class at [employer])   Currently in Pain? No/denies      Gait: Stair negotiation x 4 reps without rails with reciprocal pattern. Dynamic gait activities including ball toss vertical plane and then passing horizontally for increased head motion during gait, slow/fast ambulation, starts/stops, direction changes, and head turns. Marching with march and holds, marching with head turns, marching with ball toss with SBA to CGA   NEUROMUSCULAR RE-EDUCATION: Tandem gait x 10 feet x 3 reps Gaze x 1 viewing standing at 10 feet away distance with cues on smaller amplitude movement with cues on correct technique Standing static ball toss R and L sides  THERAPEUTIC EXERCISE: Elliptical x 2 minutes forward, 1 minute backwards with bilateral UE support Sit<>stand  SELF CARE/HOME MANAGEMENT: Discussed return to gym progression recommending return to household chores, then walking at [employer] indoor track, then return  To [employer] group, then progress up to spin class as able.        PT Short Term Goals - 02/26/15 1049    PT SHORT TERM GOAL #1  Title The patient will be indep with HEP for LE strenegthening and general mobility.   Baseline Met on 02/20/2015   Time 4   Period Weeks   Status Achieved   PT SHORT TERM GOAL #2   Title The patient will move sit<>stand x 5 reps (resting in between due to orthostasis) with UE support modified indep to demo improved mobility.   Baseline Met on 02/20/2015   Time 4   Period Weeks   Status Achieved   PT SHORT TERM GOAL #3   Title The patient will improve gait speed from 1.65 ft/sec to > or equal to 2.0 ft/sec to demo  decreased risk for falls.   Baseline Patient ambulates 3.5 ft/sec   Time 4   Period Weeks   Status Achieved   PT SHORT TERM GOAL #4   Title The patient will negotiate 4 steps with one handrail modified indep with reciprocal pattern.   Baseline Patient performs with one rail mod indep and with supervision with no rails.   Time 4   Period Weeks   Status Achieved   PT SHORT TERM GOAL #5   Title The patient will improve Berg score from 38/56 to > or equal to 44/56 to demo decreasing risk for falls.   Baseline Scores 53/56 on 02/26/2015   Time 4   Period Weeks   Status Achieved           PT Long Term Goals - 03/12/15 1401    PT LONG TERM GOAL #1   Title The patient will verbalize understanding of return to community exercise.   Baseline Target date 04/11/2015   Time 8   Period Weeks   Status On-going   PT LONG TERM GOAL #2   Title The patient will improve neuro QOL (from 40%) by 15% to demo improving quality of life/subjective measures.   Baseline Target date 04/11/2015   Time 8   Period Weeks   Status On-going   PT LONG TERM GOAL #3   Title The patient will improve ait speed from 1.65 ft/sec to > or equal to 2.4 ft/sec to demo improving confidence with ambulation.   Baseline Target date 04/11/2015   Time 8   Period Weeks   Status Achieved   PT LONG TERM GOAL #4   Title The patient will improve stair negotiation x 4 steps without a handrail independently.   Baseline Met on 03/12/2015   Time 8   Period Weeks   Status Achieved   PT LONG TERM GOAL #5   Title The patient will improve Berg score from 38/56 to > or equal to 47/56.   Baseline Target date 04/11/2015   Time 8   Period Weeks   Status Achieved               Plan - 03/12/15 1402    Clinical Impression Statement The patient is meeting LTGs in PT early.  PT to check off on HEP that was progressed last week, and then reduce to 1x/week at next sessions in order to provide time to return to community exercise  programs (as cleared by MD).    PT Next Visit Plan Progress HEP difficulty, balance, dynamic gait, single leg stance activities.  Reduce to 1x/week.   Consulted and Agree with Plan of Care Patient        Problem List Patient Active Problem List   Diagnosis Date Noted  . PCP NOTES >>>> 02/13/2015  . Headache due to trauma 02/05/2015  .  TBI (traumatic brain injury) ([place]) 01/28/2015  . Insomnia   . Hypoproteinemia ([place])   . Essential hypertension   . Acute post-traumatic headache, not intractable   . Skull fracture ([place])   . Vertigo due to brain injury ([place])   . Subarachnoid bleed ([place]) 01/22/2015  . SAH (subarachnoid hemorrhage) ([place]) 01/21/2015  . Annual physical exam 09/07/2014  . Orthostatic hypotension 08/30/2014  . Acromioclavicular arthrosis 03/21/2014  . Allergic rhinitis 02/27/2014  . SI (sacroiliac) joint dysfunction 02/02/2014  . Gastrocnemius strain, left 12/11/2013  . Toenail fungus 11/15/2013  . Back pain 08/22/2013  . LBBB (left bundle branch block) 05/13/2013  . Shingles outbreak 12/15/2012  . Borderline diabetes   . Essential hypertension, benign 07/14/2012  . Syncope and collapse 02/15/2011  . Paresthesia of foot 07/07/2010  . INSOMNIA, CHRONIC, MILD 01/12/2008  . OVERWEIGHT 10/12/2007  . HLD (hyperlipidemia) 02/04/2007  . OSTEOPENIA 02/04/2007  . ANXIETY 10/12/2006  . OSTEOARTHRITIS 10/12/2006    Ashtin Rosner, PT 03/12/2015, 2:06 PM  [facility] [address] [place], [place], [address] Phone: [phone]   Fax:  [phone]  Name: Mandy Durham MRN: [id number] Date of Birth: 22-Aug-1945

## 2015-03-14 ENCOUNTER — Ambulatory Visit: Payer: Medicare HMO | Admitting: Rehabilitative and Restorative Service Providers"

## 2015-03-14 ENCOUNTER — Ambulatory Visit: Payer: Medicare HMO | Admitting: Speech Pathology

## 2015-03-14 DIAGNOSIS — S0190XA Unspecified open wound of unspecified part of head, initial encounter: Secondary | ICD-10-CM

## 2015-03-14 DIAGNOSIS — R41841 Cognitive communication deficit: Secondary | ICD-10-CM

## 2015-03-14 DIAGNOSIS — M6281 Muscle weakness (generalized): Secondary | ICD-10-CM

## 2015-03-14 DIAGNOSIS — S069X9A Unspecified intracranial injury with loss of consciousness of unspecified duration, initial encounter: Secondary | ICD-10-CM

## 2015-03-14 DIAGNOSIS — R4701 Aphasia: Secondary | ICD-10-CM

## 2015-03-14 DIAGNOSIS — R269 Unspecified abnormalities of gait and mobility: Secondary | ICD-10-CM | POA: Diagnosis not present

## 2015-03-14 NOTE — Therapy (Signed)
[facility] [address] [place], [place], [address] Phone: [phone]   Fax:  [phone]  Speech Language Pathology Treatment  Patient Details  Name: Mandy Durham MRN: [id number] Date of Birth: [birth year] Referring Provider: Alger Simons  Encounter Date: 03/14/2015      End of Session - 03/14/15 1105    Visit Number 5   Number of Visits 17   Date for SLP Re-Evaluation 04/23/15   SLP Start Time [id number]   SLP Stop Time  1102   SLP Time Calculation (min) 47 min      Past Medical History  Diagnosis Date  . Insomnia   . Adenomatous polyp of colon   . Overweight(278.02)   . Symptomatic menopausal or female climacteric states   . Hyperlipidemia   . Osteoarthrosis, unspecified whether generalized or localized, unspecified site   . Anxiety state, unspecified   . Hypertension   . Syncope and collapse     11/12: Holter 12/12 with rare PACS, HR range 64-140, average 82, no significant arrhythmias. Echo (1/13): EF 50-55%, mild LVH, septal-lateral dyssynchrony c/w LBBB. 3-week event monitor (1/13): No significant arrhythmia.   Marland Kitchen LBBB (left bundle branch block)     LHC in 2002 showed normal coronaries.     Past Surgical History  Procedure Laterality Date  . Tubal ligation      There were no vitals filed for this visit.  Visit Diagnosis: Cognitive communication deficit  Aphasia due to open TBI (traumatic brain injury) [facility])      Subjective Assessment - 03/14/15 1023    Subjective "I think the homework went OK"               ADULT SLP TREATMENT - 03/14/15 1024    General Information   Behavior/Cognition Alert;Cooperative;Pleasant mood   Treatment Provided   Treatment provided Cognitive-Linquistic   Pain Assessment   Pain Assessment No/denies pain   Cognitive-Linquistic Treatment   Treatment focused on Aphasia;Cognition   Skilled Treatment Reviewed homework re: attention to details pt requried  min cues to ID errors.  Naming homework pt named 1 items for each category and directions stated to name 2-3 items.  Facilitaed naming items in categories with usual semantic cues to name 2-3 items in mildly complex category.  Alternate attention wtih card game "Blink"  with occasional min cues to attend to all of the cards in her hand. D/w pt driving concerns re: her attention.   Assessment / Recommendations / Plan   Plan Continue with current plan of care   Progression Toward Goals   Progression toward goals Progressing toward goals          SLP Education - 03/14/15 1103    Education provided Yes   Education Details how attention impairments can affect driving   Person(s) Educated Patient   Methods Explanation;Demonstration   Comprehension Verbalized understanding;Returned demonstration          SLP Short Term Goals - 03/14/15 1105    SLP SHORT TERM GOAL #1   Title Pt will utlize external aids for schedule and medication management over 3 sessions with rare min A   Time 3   Period Weeks   Status On-going   SLP SHORT TERM GOAL #2   Title Pt will solve mildly complex reasoning, time and money problems with 80% accuracy and rare min A   Time 3   Period Weeks   Status On-going   SLP SHORT TERM GOAL #3  Title Pt will alternate attention between 2 simple cognitive linguistic tasks with 85% on each and occasional min A   Time 3   Period Weeks   Status On-going   SLP SHORT TERM GOAL #4   Title Pt will name 8 items for a given category with rare min A   Time 3   Period Weeks   Status On-going          SLP Long Term Goals - 03/14/15 1105    SLP LONG TERM GOAL #1   Title Pt will perform mildly complex financial management tasks with 90% accuracy and rare min A   Time 7   Period Weeks   Status On-going   SLP LONG TERM GOAL #2   Title Pt will solve mildly complex reasoning, deduction, inference problems with 85% accuracy and rare min A   Time 7   Period Weeks   Status  On-going   SLP LONG TERM GOAL #3   Title Pt will divide attention between 2 simple cognitive linguistic tasks with 85% on each and rare min A   Time 7   Period Weeks   Status On-going          Plan - 03/14/15 1104    Clinical Impression Statement Attention somewhat improved today, requiring occasional min A. Usual min to mod semantic cues for  midly complex naming. Continue skilled ST to maximize cognition and language   Speech Therapy Frequency 2x / week   Treatment/Interventions SLP instruction and feedback;Compensatory strategies;Functional tasks;Patient/family education;Language facilitation;Cognitive reorganization   Potential to Achieve Goals Good   Potential Considerations Ability to learn/carryover information   Consulted and Agree with Plan of Care Patient        Problem List Patient Active Problem List   Diagnosis Date Noted  . PCP NOTES >>>> 02/13/2015  . Headache due to trauma 02/05/2015  . TBI (traumatic brain injury) ([place]) 01/28/2015  . Insomnia   . Hypoproteinemia ([place])   . Essential hypertension   . Acute post-traumatic headache, not intractable   . Skull fracture ([place])   . Vertigo due to brain injury ([place])   . Subarachnoid bleed ([place]) 01/22/2015  . SAH (subarachnoid hemorrhage) ([place]) 01/21/2015  . Annual physical exam 09/07/2014  . Orthostatic hypotension 08/30/2014  . Acromioclavicular arthrosis 03/21/2014  . Allergic rhinitis 02/27/2014  . SI (sacroiliac) joint dysfunction 02/02/2014  . Gastrocnemius strain, left 12/11/2013  . Toenail fungus 11/15/2013  . Back pain 08/22/2013  . LBBB (left bundle branch block) 05/13/2013  . Shingles outbreak 12/15/2012  . Borderline diabetes   . Essential hypertension, benign 07/14/2012  . Syncope and collapse 02/15/2011  . Paresthesia of foot 07/07/2010  . INSOMNIA, CHRONIC, MILD 01/12/2008  . OVERWEIGHT 10/12/2007  . HLD (hyperlipidemia) 02/04/2007  . OSTEOPENIA 02/04/2007  . ANXIETY 10/12/2006  .  OSTEOARTHRITIS 10/12/2006    Landy Dunnavant, Lorenda Peck, CCC-SLP 03/14/2015, 11:06 AM  [facility] [address] [place], [place], [address] Phone: [phone]   Fax:  [phone]   Name: Mandy Durham MRN: [id number] Date of Birth: 12-25-1945

## 2015-03-14 NOTE — Therapy (Signed)
[facility] [address] [place], [place], [address] Phone: [phone]   Fax:  [phone]  Physical Therapy Treatment  Patient Details  Name: Mandy Durham MRN: [id number] Date of Birth: Oct 30, 1945 Referring Provider: Dr. Alger Simons  Encounter Date: 03/14/2015      PT End of Session - 03/14/15 1243    Visit Number 9   Number of Visits 16   Date for PT Re-Evaluation 04/11/15   Authorization Type G code every 10th visit, $40 copay   PT Start Time 1105   PT Stop Time 1145   PT Time Calculation (min) 40 min   Equipment Utilized During Treatment Gait belt   Activity Tolerance Patient tolerated treatment well   Behavior During Therapy [facility] for tasks assessed/performed      Past Medical History  Diagnosis Date  . Insomnia   . Adenomatous polyp of colon   . Overweight(278.02)   . Symptomatic menopausal or female climacteric states   . Hyperlipidemia   . Osteoarthrosis, unspecified whether generalized or localized, unspecified site   . Anxiety state, unspecified   . Hypertension   . Syncope and collapse     11/12: Holter 12/12 with rare PACS, HR range 64-140, average 82, no significant arrhythmias. Echo (1/13): EF 50-55%, mild LVH, septal-lateral dyssynchrony c/w LBBB. 3-week event monitor (1/13): No significant arrhythmia.   Marland Kitchen LBBB (left bundle branch block)     LHC in 2002 showed normal coronaries.     Past Surgical History  Procedure Laterality Date  . Tubal ligation      There were no vitals filed for this visit.  Visit Diagnosis:  Abnormality of gait  Generalized muscle weakness      Subjective Assessment - 03/14/15 1106    Subjective The patient is tired after therapy.  She needed to rest after last session.   Patient Stated Goals Be able to get up and walk alone.  Also wants to return to driving and exercising.  (enjoys spin class at [employer])   Currently in Pain? No/denies       NEUROMUSCULAR RE-EDUCATION: Rocker board and foam activities performing head turns with eyes open, proactive and reactive balance activities, eyes closed with CGA for safety.  Single leg activities on compliant surfaces working on lateral weight shifting. Standing in single limb stance with UE support performing mini knee bends x 10 reps each side.  Physioball sitting with marching and LE extension activities.  Physioball trunk rotation activities reaching R<>L with UE reaching.  Quadriped UE/LE extension x 5 reps with CGA for safety  THERAPEUTIC EXERCISE: Seated hamstring stretching Thomas test stretching R and L x 2 reps  Gait: Ambulation with head turns reading cards, marching with head turns spotting targets.       PT Short Term Goals - 02/26/15 1049    PT SHORT TERM GOAL #1   Title The patient will be indep with HEP for LE strenegthening and general mobility.   Baseline Met on 02/20/2015   Time 4   Period Weeks   Status Achieved   PT SHORT TERM GOAL #2   Title The patient will move sit<>stand x 5 reps (resting in between due to orthostasis) with UE support modified indep to demo improved mobility.   Baseline Met on 02/20/2015   Time 4   Period Weeks   Status Achieved   PT SHORT TERM GOAL #3   Title The patient will improve gait speed from 1.65 ft/sec to >  or equal to 2.0 ft/sec to demo decreased risk for falls.   Baseline Patient ambulates 3.5 ft/sec   Time 4   Period Weeks   Status Achieved   PT SHORT TERM GOAL #4   Title The patient will negotiate 4 steps with one handrail modified indep with reciprocal pattern.   Baseline Patient performs with one rail mod indep and with supervision with no rails.   Time 4   Period Weeks   Status Achieved   PT SHORT TERM GOAL #5   Title The patient will improve Berg score from 38/56 to > or equal to 44/56 to demo decreasing risk for falls.   Baseline Scores 53/56 on 02/26/2015   Time 4   Period Weeks   Status Achieved            PT Long Term Goals - 03/12/15 1401    PT LONG TERM GOAL #1   Title The patient will verbalize understanding of return to community exercise.   Baseline Target date 04/11/2015   Time 8   Period Weeks   Status On-going   PT LONG TERM GOAL #2   Title The patient will improve neuro QOL (from 40%) by 15% to demo improving quality of life/subjective measures.   Baseline Target date 04/11/2015   Time 8   Period Weeks   Status On-going   PT LONG TERM GOAL #3   Title The patient will improve ait speed from 1.65 ft/sec to > or equal to 2.4 ft/sec to demo improving confidence with ambulation.   Baseline Target date 04/11/2015   Time 8   Period Weeks   Status Achieved   PT LONG TERM GOAL #4   Title The patient will improve stair negotiation x 4 steps without a handrail independently.   Baseline Met on 03/12/2015   Time 8   Period Weeks   Status Achieved   PT LONG TERM GOAL #5   Title The patient will improve Berg score from 38/56 to > or equal to 47/56.   Baseline Target date 04/11/2015   Time 8   Period Weeks   Status Achieved               Plan - 03/14/15 1244    Clinical Impression Statement The patient is continuing to demonstrate progress with physical therapy.  PT to decrease down to 1 time/week and continue progressing towards LTGs.   PT Next Visit Plan High level ballance, dynamic gait, single leg stance activities, core strengthening, hip extension, quadriped core exercises.  Reduce to 1x/week.  G code next visit (use Berg=53/56, gait speed=3.5)- current status CI, goal status CI   Consulted and Agree with Plan of Care Patient        Problem List Patient Active Problem List   Diagnosis Date Noted  . PCP NOTES >>>> 02/13/2015  . Headache due to trauma 02/05/2015  . TBI (traumatic brain injury) ([place]) 01/28/2015  . Insomnia   . Hypoproteinemia ([place])   . Essential hypertension   . Acute post-traumatic headache, not intractable   . Skull fracture ([place])    . Vertigo due to brain injury ([place])   . Subarachnoid bleed ([place]) 01/22/2015  . SAH (subarachnoid hemorrhage) ([place]) 01/21/2015  . Annual physical exam 09/07/2014  . Orthostatic hypotension 08/30/2014  . Acromioclavicular arthrosis 03/21/2014  . Allergic rhinitis 02/27/2014  . SI (sacroiliac) joint dysfunction 02/02/2014  . Gastrocnemius strain, left 12/11/2013  . Toenail fungus 11/15/2013  . Back pain 08/22/2013  .  LBBB (left bundle branch block) 05/13/2013  . Shingles outbreak 12/15/2012  . Borderline diabetes   . Essential hypertension, benign 07/14/2012  . Syncope and collapse 02/15/2011  . Paresthesia of foot 07/07/2010  . INSOMNIA, CHRONIC, MILD 01/12/2008  . OVERWEIGHT 10/12/2007  . HLD (hyperlipidemia) 02/04/2007  . OSTEOPENIA 02/04/2007  . ANXIETY 10/12/2006  . OSTEOARTHRITIS 10/12/2006    Mandy Durham, PT 03/14/2015, 2:34 PM  [facility] [address] [place], [place], [address] Phone: [phone]   Fax:  [phone]  Name: Mandy Durham MRN: [id number] Date of Birth: Mar 12, 1946

## 2015-03-20 ENCOUNTER — Ambulatory Visit: Payer: Medicare HMO | Attending: Physical Medicine & Rehabilitation | Admitting: Speech Pathology

## 2015-03-20 ENCOUNTER — Encounter: Payer: Self-pay | Admitting: Physical Therapy

## 2015-03-20 ENCOUNTER — Ambulatory Visit: Payer: Medicare HMO | Admitting: Physical Therapy

## 2015-03-20 DIAGNOSIS — M6281 Muscle weakness (generalized): Secondary | ICD-10-CM

## 2015-03-20 DIAGNOSIS — S069X1S Unspecified intracranial injury with loss of consciousness of 30 minutes or less, sequela: Secondary | ICD-10-CM | POA: Insufficient documentation

## 2015-03-20 DIAGNOSIS — X58XXXS Exposure to other specified factors, sequela: Secondary | ICD-10-CM | POA: Insufficient documentation

## 2015-03-20 DIAGNOSIS — R269 Unspecified abnormalities of gait and mobility: Secondary | ICD-10-CM

## 2015-03-20 DIAGNOSIS — R4701 Aphasia: Secondary | ICD-10-CM | POA: Insufficient documentation

## 2015-03-20 DIAGNOSIS — R41841 Cognitive communication deficit: Secondary | ICD-10-CM | POA: Diagnosis present

## 2015-03-20 DIAGNOSIS — S0190XA Unspecified open wound of unspecified part of head, initial encounter: Secondary | ICD-10-CM

## 2015-03-20 DIAGNOSIS — S069X0A Unspecified intracranial injury without loss of consciousness, initial encounter: Secondary | ICD-10-CM | POA: Insufficient documentation

## 2015-03-20 DIAGNOSIS — S069X9A Unspecified intracranial injury with loss of consciousness of unspecified duration, initial encounter: Secondary | ICD-10-CM

## 2015-03-20 DIAGNOSIS — F8089 Other developmental disorders of speech and language: Secondary | ICD-10-CM | POA: Insufficient documentation

## 2015-03-20 NOTE — Patient Instructions (Signed)
WALKING Tips  Walking is the single best weight bearing exercise for most people most of the time. All that is needed is a good pair of walking shoes and a safe place to walk. GUIDELINES: 1.Shoes should be supportive and large enough to easily accommodate your feet in both length and width. 2.To get more out of your walking, practice walking exercises. 3.Establish a regular walking habit. 4.Maintain good upper body alignment when walking. START WITH: __5_ minutes walking 5 minutes rest for 3 repetitions  _3-5__ times weekly.  Once 3 repetitions does not cause moderate fatigue for 1 week. Increase to 6 minutes walking, 4 minutes rest, 3 repetitions.  Once 3 repetitions does not cause moderate fatigue for 1 week. Increase to 7 minutes walking, 3 minutes rest, 3 repetitions.  Once 3 repetitions does not cause moderate fatigue for 1 week. Increase to 8 minutes walking, 2 minutes rest, 3 repetitions.  Once 3 repetitions does not cause moderate fatigue for 1 week. Increase to 9 minutes walking, 1 minutes rest, 3 repetitions.  WORK UP TO: _30__ minutes _3-5__ times weekly. CONSIDER the use of a heart rate monitor and/or pedometer to add purpose, goals, and variability to your walking workout.  Copyright  VHI. All rights reserved.

## 2015-03-20 NOTE — Therapy (Signed)
[facility] [address] [place], [place], [address] Phone: [phone]   Fax:  [phone]  Speech Language Pathology Treatment  Patient Details  Name: Mandy Durham MRN: [id number] Date of Birth: Jun 16, 1945 Referring Provider: Alger Simons  Encounter Date: 03/20/2015      End of Session - 03/20/15 1101    Visit Number 6   Number of Visits 17   Date for SLP Re-Evaluation 04/23/15   Authorization Type none   SLP Start Time 0932   SLP Stop Time  1017   SLP Time Calculation (min) 45 min      Past Medical History  Diagnosis Date  . Insomnia   . Adenomatous polyp of colon   . Overweight(278.02)   . Symptomatic menopausal or female climacteric states   . Hyperlipidemia   . Osteoarthrosis, unspecified whether generalized or localized, unspecified site   . Anxiety state, unspecified   . Hypertension   . Syncope and collapse     11/12: Holter 12/12 with rare PACS, HR range 64-140, average 82, no significant arrhythmias. Echo (1/13): EF 50-55%, mild LVH, septal-lateral dyssynchrony c/w LBBB. 3-week event monitor (1/13): No significant arrhythmia.   Marland Kitchen LBBB (left bundle branch block)     LHC in 2002 showed normal coronaries.     Past Surgical History  Procedure Laterality Date  . Tubal ligation      There were no vitals filed for this visit.  Visit Diagnosis: Cognitive communication deficit  Aphasia due to open TBI (traumatic brain injury) [facility])      Subjective Assessment - 03/20/15 0931    Subjective "I liked the homework"   Currently in Pain? No/denies               ADULT SLP TREATMENT - 03/20/15 0935    General Information   Behavior/Cognition Alert;Cooperative;Pleasant mood   Treatment Provided   Treatment provided Cognitive-Linquistic   Pain Assessment   Pain Assessment No/denies pain   Cognitive-Linquistic Treatment   Treatment focused on Aphasia;Cognition   Skilled Treatment Homework  accurate with good attention to detail. Naming synonyms with 85% accuracy and occasional min cues.  She reports word finding difficulty during converstions occasoinally and more frequently over the phone. Pt demonstrated attention to detail and alternating attention with map reading and writing exact directions acoording to specifications/effifciency requiring organization/reasoning with  95% accuracy and rare min cues. Simple time and money word problems with 100% accuracy and supervision cues. Pt. independently accessed her schedule sheet to answer questions re: dates, schdules with mod I.    Assessment / Recommendations / Plan   Plan Continue with current plan of care   Progression Toward Goals   Progression toward goals Progressing toward goals          SLP Education - 03/20/15 1058    Education provided Yes   Education Details progress in therapy   Person(s) Educated Patient   Methods Explanation   Comprehension Verbalized understanding          SLP Short Term Goals - 03/20/15 1100    SLP SHORT TERM GOAL #1   Title Pt will utlize external aids for schedule and medication management over 3 sessions with rare min A   Time 2   Period Weeks   Status Achieved   SLP SHORT TERM GOAL #2   Title Pt will solve mildly complex reasoning, time and money problems with 80% accuracy and rare min A   Time 2  Period Weeks   Status Achieved   SLP SHORT TERM GOAL #3   Title Pt will alternate attention between 2 simple cognitive linguistic tasks with 85% on each and occasional min A   Time 2   Period Weeks   Status Achieved   SLP SHORT TERM GOAL #4   Title Pt will name 8 items for a given category with rare min A   Time 2   Period Weeks   Status On-going          SLP Long Term Goals - 03/20/15 1101    SLP LONG TERM GOAL #1   Title Pt will perform mildly complex financial management tasks with 90% accuracy and rare min A   Time 6   Period Weeks   Status On-going   SLP LONG TERM  GOAL #2   Title Pt will solve mildly complex reasoning, deduction, inference problems with 85% accuracy and rare min A   Time 6   Period Weeks   Status On-going   SLP LONG TERM GOAL #3   Title Pt will divide attention between 2 simple cognitive linguistic tasks with 85% on each and rare min A   Time 6   Period Weeks   Status On-going          Plan - 03/20/15 1058    Clinical Impression Statement Alternating attention and problem solving continue to improve with rare min A. Pt demonstrating anticipatory awareness by rechecking work, re reading directions with mod I. Naming with occasional min A. Continue skilled ST to maximize verbal expression and cognition for improved indepednced.    Speech Therapy Frequency 2x / week   Treatment/Interventions SLP instruction and feedback;Compensatory strategies;Functional tasks;Patient/family education;Language facilitation;Cognitive reorganization   Potential to Achieve Goals Good   Potential Considerations Ability to learn/carryover information   Consulted and Agree with Plan of Care Patient        Problem List Patient Active Problem List   Diagnosis Date Noted  . PCP NOTES >>>> 02/13/2015  . Headache due to trauma 02/05/2015  . TBI (traumatic brain injury) ([place]) 01/28/2015  . Insomnia   . Hypoproteinemia ([place])   . Essential hypertension   . Acute post-traumatic headache, not intractable   . Skull fracture ([place])   . Vertigo due to brain injury ([place])   . Subarachnoid bleed ([place]) 01/22/2015  . SAH (subarachnoid hemorrhage) ([place]) 01/21/2015  . Annual physical exam 09/07/2014  . Orthostatic hypotension 08/30/2014  . Acromioclavicular arthrosis 03/21/2014  . Allergic rhinitis 02/27/2014  . SI (sacroiliac) joint dysfunction 02/02/2014  . Gastrocnemius strain, left 12/11/2013  . Toenail fungus 11/15/2013  . Back pain 08/22/2013  . LBBB (left bundle branch block) 05/13/2013  . Shingles outbreak 12/15/2012  . Borderline diabetes   .  Essential hypertension, benign 07/14/2012  . Syncope and collapse 02/15/2011  . Paresthesia of foot 07/07/2010  . INSOMNIA, CHRONIC, MILD 01/12/2008  . OVERWEIGHT 10/12/2007  . HLD (hyperlipidemia) 02/04/2007  . OSTEOPENIA 02/04/2007  . ANXIETY 10/12/2006  . OSTEOARTHRITIS 10/12/2006    Mahina Salatino, Annye Rusk MS, CCC-SLP 03/20/2015, 11:02 AM  [facility] [address] [place], [place], [address] Phone: [phone]   Fax:  [phone]   Name: Mandy Durham MRN: [id number] Date of Birth: [birth year]

## 2015-03-20 NOTE — Therapy (Signed)
[facility] [address] [place], [place], [address] Phone: [phone]   Fax:  [phone]  Physical Therapy Treatment  Patient Details  Name: Mandy Durham MRN: [id number] Date of Birth: 09-17-1945 Referring Provider: Dr. Alger Simons  Encounter Date: 03/20/2015      PT End of Session - 03/20/15 1100    Visit Number 10   Number of Visits 16   Date for PT Re-Evaluation 04/11/15   Authorization Type G code every 10th visit, $40 copay   PT Start Time 1017   PT Stop Time 1100   PT Time Calculation (min) 43 min   Equipment Utilized During Treatment Gait belt   Activity Tolerance Patient tolerated treatment well   Behavior During Therapy [facility] for tasks assessed/performed      Past Medical History  Diagnosis Date  . Insomnia   . Adenomatous polyp of colon   . Overweight(278.02)   . Symptomatic menopausal or female climacteric states   . Hyperlipidemia   . Osteoarthrosis, unspecified whether generalized or localized, unspecified site   . Anxiety state, unspecified   . Hypertension   . Syncope and collapse     11/12: Holter 12/12 with rare PACS, HR range 64-140, average 82, no significant arrhythmias. Echo (1/13): EF 50-55%, mild LVH, septal-lateral dyssynchrony c/w LBBB. 3-week event monitor (1/13): No significant arrhythmia.   Marland Kitchen LBBB (left bundle branch block)     LHC in 2002 showed normal coronaries.     Past Surgical History  Procedure Laterality Date  . Tubal ligation      There were no vitals filed for this visit.  Visit Diagnosis:  Abnormality of gait  Generalized muscle weakness  TBI (traumatic brain injury), with loss of consciousness of 30 minutes or less, sequela (HCC)      Subjective Assessment - 03/20/15 1021    Subjective No falls or issues since last session.    Currently in Pain? No/denies     Neuromuscular Re-education:  Rocker Board: anterior/posterior and lateral stabilization  static & head turns with EO and weight shifts with visual feedback from mirror, tactile & verbal cues.  Compliant Surface: static EC and head turns with EO feet together tactile cues  Therapeutic Exercise:  Seated hamstring stretch 30sec hold 2 reps for Rt & Lt LE; standing heelcord stretch 30 sec hold 2 reps for Rt & Lt LE; verbal cues for both. PT instructed patient in walking program - see pt instructions. Patient ambulated 5 min, rest 5 min, 3 reps with verbal cues. First 5 min ambulated 1000' outside including ramps & curb with SBA; 2nd & 3rd ambulated 800' inside with SBA.                            PT Short Term Goals - 02/26/15 1049    PT SHORT TERM GOAL #1   Title The patient will be indep with HEP for LE strenegthening and general mobility.   Baseline Met on 02/20/2015   Time 4   Period Weeks   Status Achieved   PT SHORT TERM GOAL #2   Title The patient will move sit<>stand x 5 reps (resting in between due to orthostasis) with UE support modified indep to demo improved mobility.   Baseline Met on 02/20/2015   Time 4   Period Weeks   Status Achieved   PT SHORT TERM GOAL #3   Title The patient will improve gait speed  from 1.65 ft/sec to > or equal to 2.0 ft/sec to demo decreased risk for falls.   Baseline Patient ambulates 3.5 ft/sec   Time 4   Period Weeks   Status Achieved   PT SHORT TERM GOAL #4   Title The patient will negotiate 4 steps with one handrail modified indep with reciprocal pattern.   Baseline Patient performs with one rail mod indep and with supervision with no rails.   Time 4   Period Weeks   Status Achieved   PT SHORT TERM GOAL #5   Title The patient will improve Berg score from 38/56 to > or equal to 44/56 to demo decreasing risk for falls.   Baseline Scores 53/56 on 02/26/2015   Time 4   Period Weeks   Status Achieved           PT Long Term Goals - 03/12/15 1401    PT LONG TERM GOAL #1   Title The patient will verbalize  understanding of return to community exercise.   Baseline Target date 04/11/2015   Time 8   Period Weeks   Status On-going   PT LONG TERM GOAL #2   Title The patient will improve neuro QOL (from 40%) by 15% to demo improving quality of life/subjective measures.   Baseline Target date 04/11/2015   Time 8   Period Weeks   Status On-going   PT LONG TERM GOAL #3   Title The patient will improve ait speed from 1.65 ft/sec to > or equal to 2.4 ft/sec to demo improving confidence with ambulation.   Baseline Target date 04/11/2015   Time 8   Period Weeks   Status Achieved   PT LONG TERM GOAL #4   Title The patient will improve stair negotiation x 4 steps without a handrail independently.   Baseline Met on 03/12/2015   Time 8   Period Weeks   Status Achieved   PT LONG TERM GOAL #5   Title The patient will improve Berg score from 38/56 to > or equal to 47/56.   Baseline Target date 04/11/2015   Time 8   Period Weeks   Status Achieved               Plan - 04/12/15 1100    Clinical Impression Statement Patient was fatigued with 5 minutes of gait but recovered with 5 minutes rest to perform 3 repetitions. Patient reports fatigue after therapy or increased activities limits her activity level for community.    Pt will benefit from skilled therapeutic intervention in order to improve on the following deficits Abnormal gait;Decreased activity tolerance;Decreased balance;Decreased mobility;Decreased strength;Difficulty walking;Dizziness;Pain;Decreased endurance   Rehab Potential Good   PT Frequency 2x / week   PT Duration 8 weeks   PT Treatment/Interventions ADLs/Self Care Home Management;Therapeutic exercise;Therapeutic activities;Gait training;Neuromuscular re-education;Canalith Repostioning;Vestibular;Patient/family education   PT Next Visit Plan Check walking program; High level ballance, dynamic gait, single leg stance activities, core strengthening, hip extension, quadriped core  exercises. Reduce to 1x/week.   Consulted and Agree with Plan of Care Patient          G-Codes - 04-12-15 1329    Functional Assessment Tool Used  Berg=53/56, gait speed=3.5   Functional Limitation Mobility: Walking and moving around   Mobility: Walking and Moving Around Current Status (H8299) At least 1 percent but less than 20 percent impaired, limited or restricted   Mobility: Walking and Moving Around Goal Status (B7169) At least 1 percent but less than 20 percent  impaired, limited or restricted      Problem List Patient Active Problem List   Diagnosis Date Noted  . PCP NOTES >>>> 02/13/2015  . Headache due to trauma 02/05/2015  . TBI (traumatic brain injury) ([place]) 01/28/2015  . Insomnia   . Hypoproteinemia ([place])   . Essential hypertension   . Acute post-traumatic headache, not intractable   . Skull fracture ([place])   . Vertigo due to brain injury ([place])   . Subarachnoid bleed ([place]) 01/22/2015  . SAH (subarachnoid hemorrhage) ([place]) 01/21/2015  . Annual physical exam 09/07/2014  . Orthostatic hypotension 08/30/2014  . Acromioclavicular arthrosis 03/21/2014  . Allergic rhinitis 02/27/2014  . SI (sacroiliac) joint dysfunction 02/02/2014  . Gastrocnemius strain, left 12/11/2013  . Toenail fungus 11/15/2013  . Back pain 08/22/2013  . LBBB (left bundle branch block) 05/13/2013  . Shingles outbreak 12/15/2012  . Borderline diabetes   . Essential hypertension, benign 07/14/2012  . Syncope and collapse 02/15/2011  . Paresthesia of foot 07/07/2010  . INSOMNIA, CHRONIC, MILD 01/12/2008  . OVERWEIGHT 10/12/2007  . HLD (hyperlipidemia) 02/04/2007  . OSTEOPENIA 02/04/2007  . ANXIETY 10/12/2006  . OSTEOARTHRITIS 10/12/2006    Julieana Eshleman PT, DPT 03/20/2015, 1:32 PM  [facility] [address] [place], [place], [address] Phone: [phone]   Fax:  [phone]  Name: Mandy Durham MRN: [id number] Date of Birth:  1945-04-27

## 2015-03-22 ENCOUNTER — Encounter: Payer: Self-pay | Admitting: Internal Medicine

## 2015-03-22 ENCOUNTER — Ambulatory Visit (INDEPENDENT_AMBULATORY_CARE_PROVIDER_SITE_OTHER): Payer: Medicare HMO | Admitting: Internal Medicine

## 2015-03-22 ENCOUNTER — Ambulatory Visit: Payer: Medicare HMO

## 2015-03-22 VITALS — BP 122/68 | HR 60 | Temp 98.1°F | Ht 67.0 in | Wt 146.0 lb

## 2015-03-22 DIAGNOSIS — R55 Syncope and collapse: Secondary | ICD-10-CM

## 2015-03-22 DIAGNOSIS — I609 Nontraumatic subarachnoid hemorrhage, unspecified: Secondary | ICD-10-CM

## 2015-03-22 DIAGNOSIS — R4701 Aphasia: Secondary | ICD-10-CM

## 2015-03-22 DIAGNOSIS — R41841 Cognitive communication deficit: Secondary | ICD-10-CM | POA: Diagnosis not present

## 2015-03-22 DIAGNOSIS — S0190XA Unspecified open wound of unspecified part of head, initial encounter: Secondary | ICD-10-CM

## 2015-03-22 DIAGNOSIS — S069X9A Unspecified intracranial injury with loss of consciousness of unspecified duration, initial encounter: Secondary | ICD-10-CM

## 2015-03-22 NOTE — Therapy (Signed)
[name] [address] [place], [place], [address] Phone: [phone]   Fax:  [phone]  Speech Language Pathology Treatment  Patient Details  Name: Mandy Durham MRN: [id number] Date of Birth: [birth year] Referring Provider: Alger Simons  Encounter Date: 03/22/2015      End of Session - 03/22/15 0935    Visit Number 7   Number of Visits 17   Date for SLP Re-Evaluation 04/23/15   SLP Start Time 0852   SLP Stop Time  0934   SLP Time Calculation (min) 42 min   Activity Tolerance Patient tolerated treatment well      Past Medical History  Diagnosis Date  . Insomnia   . Adenomatous polyp of colon   . Overweight(278.02)   . Symptomatic menopausal or female climacteric states   . Hyperlipidemia   . Osteoarthrosis, unspecified whether generalized or localized, unspecified site   . Anxiety state, unspecified   . Hypertension   . Syncope and collapse     11/12: Holter 12/12 with rare PACS, HR range 64-140, average 82, no significant arrhythmias. Echo (1/13): EF 50-55%, mild LVH, septal-lateral dyssynchrony c/w LBBB. 3-week event monitor (1/13): No significant arrhythmia.   Marland Kitchen LBBB (left bundle branch block)     LHC in 2002 showed normal coronaries.     Past Surgical History  Procedure Laterality Date  . Tubal ligation      There were no vitals filed for this visit.  Visit Diagnosis: Cognitive communication deficit  Aphasia due to open TBI (traumatic brain injury) ([place])      Subjective Assessment - 03/22/15 0851    Subjective Pt in cheerful mood.               ADULT SLP TREATMENT - 03/22/15 0851    General Information   Behavior/Cognition Alert;Cooperative;Pleasant mood   Treatment Provided   Treatment provided Cognitive-Linquistic   Pain Assessment   Pain Assessment No/denies pain   Cognitive-Linquistic Treatment   Treatment focused on Aphasia;Cognition   Skilled Treatment Pt states she  has anomia approx x1/day, however ability for mod complex naming with >3 items req'd mod A (semantic cues). Suspect possible verbal/linguistic organization may be hindering pt progress.    Assessment / Recommendations / Plan   Plan Continue with current plan of care   Progression Toward Goals   Progression toward goals Progressing toward goals          SLP Education - 03/22/15 0935    Education provided Yes   Education Details crossword books   Person(s) Educated Patient   Methods Explanation   Comprehension Verbalized understanding          SLP Short Term Goals - 03/22/15 0915    SLP SHORT TERM GOAL #1   Title Pt will utlize external aids for schedule and medication management over 3 sessions with rare min A   Time 2   Period Weeks   Status Achieved   SLP SHORT TERM GOAL #2   Title Pt will solve mildly complex reasoning, time and money problems with 80% accuracy and rare min A   Time --   Period --   Status Achieved   SLP SHORT TERM GOAL #3   Title Pt will alternate attention between 2 simple cognitive linguistic tasks with 85% on each and occasional min A   Time --   Period --   Status Achieved   SLP SHORT TERM GOAL #4   Title Pt will name 8  items for a given category with rare min A   Time --   Period --   Status Not Met          SLP Long Term Goals - 03/22/15 0912    SLP LONG TERM GOAL #1   Title Pt will perform mildly complex financial management tasks with 90% accuracy and rare min A   Time 4   Period Weeks   Status On-going   SLP LONG TERM GOAL #2   Title Pt will solve mildly complex reasoning, deduction, inference problems with 85% accuracy and rare min A   Time 4   Period Weeks   Status On-going   SLP LONG TERM GOAL #3   Title Pt will divide attention between 2 simple cognitive linguistic tasks with 85% on each and rare min A   Time 4   Period Weeks   Status On-going   SLP LONG TERM GOAL #4   Title pt will engage in mod complex conversation  functionally for 10 minutes   Time 4   Period Weeks   Status New          Plan - 03/22/15 0935    Clinical Impression Statement Alternating attention and problem solving continue to improve with rare min A. Progress with structured naming tasks may be complicated by verbal/language organization deficit. Continue skilled ST to maximize verbal expression and cognition for improved indepednced.    Speech Therapy Frequency 2x / week   Duration 4 weeks   Treatment/Interventions SLP instruction and feedback;Compensatory strategies;Functional tasks;Patient/family education;Language facilitation;Cognitive reorganization   Potential to Achieve Goals Good   Potential Considerations Ability to learn/carryover information;Severity of impairments        Problem List Patient Active Problem List   Diagnosis Date Noted  . PCP NOTES >>>> 02/13/2015  . Headache due to trauma 02/05/2015  . TBI (traumatic brain injury) ([place]) 01/28/2015  . Insomnia   . Hypoproteinemia ([place])   . Essential hypertension   . Acute post-traumatic headache, not intractable   . Skull fracture ([place])   . Vertigo due to brain injury ([place])   . Subarachnoid bleed ([place]) 01/22/2015  . SAH (subarachnoid hemorrhage) ([place]) 01/21/2015  . Annual physical exam 09/07/2014  . Orthostatic hypotension 08/30/2014  . Acromioclavicular arthrosis 03/21/2014  . Allergic rhinitis 02/27/2014  . SI (sacroiliac) joint dysfunction 02/02/2014  . Gastrocnemius strain, left 12/11/2013  . Toenail fungus 11/15/2013  . Back pain 08/22/2013  . LBBB (left bundle branch block) 05/13/2013  . Shingles outbreak 12/15/2012  . Borderline diabetes   . Essential hypertension, benign 07/14/2012  . Syncope and collapse 02/15/2011  . Paresthesia of foot 07/07/2010  . INSOMNIA, CHRONIC, MILD 01/12/2008  . OVERWEIGHT 10/12/2007  . HLD (hyperlipidemia) 02/04/2007  . OSTEOPENIA 02/04/2007  . ANXIETY 10/12/2006  . OSTEOARTHRITIS 10/12/2006     Blake Goya , MS, CCC-SLP  03/22/2015, 9:38 AM  [facility] [address] [place], [place], [address] Phone: [phone]   Fax:  [phone]   Name: Mandy Durham MRN: [id number] Date of Birth: 1945-12-09

## 2015-03-22 NOTE — Progress Notes (Signed)
Pre visit review using our clinic review tool, if applicable. No additional management support is needed unless otherwise documented below in the visit note. 

## 2015-03-22 NOTE — Progress Notes (Signed)
Subjective:    Patient ID: Mandy Durham, female    DOB: January 22, 1946, 69 y.o.   MRN: [id number]  DOS:  03/22/2015 Type of visit - description : Follow-up Interval history: since the last time she was here, she is doing rehabilitation for her history of syncope and SAH. She also saw cardiology, not reviewed. In general feeling well, stronger. Reports occasional stool leak since she had the [facility]. When asked, she also admits to urine leaks from time to time since the accident. Reports no neck or back pain, no lower extremity numbness   Review of Systems Denies diarrhea, blood in the stools, pain with bowel movements. No dysuria or gross hematuria She had headache and nausea after the accident, that is significantly better. Denies insomnia, symptoms controlled with Xanax.   Past Medical History  Diagnosis Date  . Insomnia   . Adenomatous polyp of colon   . Overweight(278.02)   . Symptomatic menopausal or female climacteric states   . Hyperlipidemia   . Osteoarthrosis, unspecified whether generalized or localized, unspecified site   . Anxiety state, unspecified   . Hypertension   . Syncope and collapse     11/12: Holter 12/12 with rare PACS, HR range 64-140, average 82, no significant arrhythmias. Echo (1/13): EF 50-55%, mild LVH, septal-lateral dyssynchrony c/w LBBB. 3-week event monitor (1/13): No significant arrhythmia.   Marland Kitchen LBBB (left bundle branch block)     LHC in 2002 showed normal coronaries.     Past Surgical History  Procedure Laterality Date  . Tubal ligation      Social History   Social History  . Marital Status: Married    Spouse Name: N/A  . Number of Children: 2  . Years of Education: N/A   Occupational History  . retired Education administrator   Social History Main Topics  . Smoking status: Never Smoker   . Smokeless tobacco: Never Used  . Alcohol Use: No  . Drug Use: No  . Sexual Activity: Not on file   Other Topics Concern    . Not on file   Social History Narrative   HSG.  Married '65.  2 daughters, '71, '77; 4 grandchildren                 Medication List       This list is accurate as of: 03/22/15 11:59 PM.  Always use your most recent med list.               acetaminophen 325 MG tablet  Commonly known as:  TYLENOL  Take 2 tablets (650 mg total) by mouth 2 (two) times daily with breakfast and lunch. To help manage headaches during the day.     ALPRAZolam 0.25 MG tablet  Commonly known as:  XANAX  Take 1-2 tablets (0.25-0.5 mg total) by mouth at bedtime as needed for sleep.     calcium carbonate 600 MG Tabs tablet  Commonly known as:  OS-CAL  Take 1,200 mg by mouth daily with breakfast.     cholecalciferol 1000 units tablet  Commonly known as:  VITAMIN D  Take 2,000 Units by mouth daily.     meclizine 12.5 MG tablet  Commonly known as:  ANTIVERT  Take 1 tablet (12.5 mg total) by mouth 3 (three) times daily as needed for dizziness.     metoprolol tartrate 25 MG tablet  Commonly known as:  LOPRESSOR  Take 1 tablet (25 mg total) by mouth 2 (two)  times daily.     ondansetron 4 MG tablet  Commonly known as:  ZOFRAN  Take 1 tablet (4 mg total) by mouth every 8 (eight) hours as needed for nausea or vomiting.     PARoxetine 10 MG tablet  Commonly known as:  PAXIL  Take 10 mg by mouth daily.     pyridostigmine 60 MG tablet  Commonly known as:  MESTINON  Take 1 tablet (60 mg total) by mouth 3 (three) times daily.     senna-docusate 8.6-50 MG tablet  Commonly known as:  Senokot-S  Take 1 tablet by mouth at bedtime. As needed for constipation     topiramate 25 MG tablet  Commonly known as:  TOPAMAX  Take 0.5 tablets (12.5 mg total) by mouth at bedtime. To help manage headaches.           Objective:   Physical Exam BP 122/68 mmHg  Pulse 60  Temp(Src) 98.1 F (36.7 C) (Oral)  Ht 5\' 7"  (1.702 m)  Wt 146 lb (66.225 kg)  BMI 22.86 kg/m2  SpO2 96% General:   Well developed,  well nourished . NAD.  HEENT:  Normocephalic . Face symmetric, atraumatic Lungs:  CTA B Normal respiratory effort, no intercostal retractions, no accessory muscle use. Heart: RRR,  no murmur.  no pretibial edema bilaterally  Skin: Not pale. Not jaundice Neurologic:  alert & oriented X3.  Speech normal occasional difficulty with word finding, gait appropriate for age and unassisted. Motor symmetric, DTRs symmetric. Psych--  Cognition and judgment appear intact.  Cooperative with normal attention span and concentration.  Behavior appropriate. No anxious or depressed appearing.    Assessment & Plan:   Assessment> Prediabetes  HTN Hyperlipidemia Anxiety Menopausal Recurrent Syncope-- extensive cards eval before, on mestinon LBBB Traumatic SAH (after a syncope), admitted 01-2015   PLAN Syncope: No further events, saw cardiology, dx is autonomic insufficiency, she is using an abdominal binder and that seems to help. Traumatic SAH: Doing physical therapy, to see Dr. Tessa Lerner soon. Saw Dr. Saintclair Halsted, neurosurgery ---> rec to see him prn B/B incontinence since [facility]: No axial pain, DTRs symmetric, no lower extremity paresthesias. Etiology unclear. Will informally d/w neuro  Also , recommend to discuss his Dr. Tessa Lerner RTC 3-4 months

## 2015-03-22 NOTE — Patient Instructions (Signed)
Get a crossword book and do them. Ask for help if needed, not giving youthe words but providing different clues for YOU to think of the word

## 2015-03-22 NOTE — Patient Instructions (Addendum)
BEFORE YOU LEAVE THE OFFICE:  GO TO THE FRONT DESK  Schedule a routine office visit or check up to be done in  3 to 4 months  No  fasting

## 2015-03-25 ENCOUNTER — Encounter: Payer: Self-pay | Admitting: Rehabilitative and Restorative Service Providers"

## 2015-03-25 NOTE — Therapy (Signed)
[facility] [address] [place], [place], [address] Phone: [phone]   Fax:  [phone]  Patient Details  Name: Mandy Durham MRN: [id number] Date of Birth: Mar 27, 1945 Referring Provider:  No ref. provider found  Encounter Date: last encounter 03/20/2015  Physical Therapy Progress Note  Dates of Reporting Period: 02/11/2015 to 03/20/2015  Objective Reports of Subjective Statement: Patient improving balance walking in home and community without an assistive device.  Objective Measurements:  Berg=53/56 Gait speed=3.5 ft/sec  Goal Update:      PT Short Term Goals - 02/26/15 1049    PT SHORT TERM GOAL #1   Title The patient will be indep with HEP for LE strenegthening and general mobility.   Baseline Met on 02/20/2015   Time 4   Period Weeks   Status Achieved   PT SHORT TERM GOAL #2   Title The patient will move sit<>stand x 5 reps (resting in between due to orthostasis) with UE support modified indep to demo improved mobility.   Baseline Met on 02/20/2015   Time 4   Period Weeks   Status Achieved   PT SHORT TERM GOAL #3   Title The patient will improve gait speed from 1.65 ft/sec to > or equal to 2.0 ft/sec to demo decreased risk for falls.   Baseline Patient ambulates 3.5 ft/sec   Time 4   Period Weeks   Status Achieved   PT SHORT TERM GOAL #4   Title The patient will negotiate 4 steps with one handrail modified indep with reciprocal pattern.   Baseline Patient performs with one rail mod indep and with supervision with no rails.   Time 4   Period Weeks   Status Achieved   PT SHORT TERM GOAL #5   Title The patient will improve Berg score from 38/56 to > or equal to 44/56 to demo decreasing risk for falls.   Baseline Scores 53/56 on 02/26/2015   Time 4   Period Weeks   Status Achieved          PT Long Term Goals - 03/12/15 1401    PT LONG TERM GOAL #1   Title The patient will verbalize understanding  of return to community exercise.   Baseline Target date 04/11/2015   Time 8   Period Weeks   Status On-going   PT LONG TERM GOAL #2   Title The patient will improve neuro QOL (from 40%) by 15% to demo improving quality of life/subjective measures.   Baseline Target date 04/11/2015   Time 8   Period Weeks   Status On-going   PT LONG TERM GOAL #3   Title The patient will improve ait speed from 1.65 ft/sec to > or equal to 2.4 ft/sec to demo improving confidence with ambulation.   Baseline Target date 04/11/2015   Time 8   Period Weeks   Status Achieved   PT LONG TERM GOAL #4   Title The patient will improve stair negotiation x 4 steps without a handrail independently.   Baseline Met on 03/12/2015   Time 8   Period Weeks   Status Achieved   PT LONG TERM GOAL #5   Title The patient will improve Berg score from 38/56 to > or equal to 47/56.   Baseline Target date 04/11/2015   Time 8   Period Weeks   Status Achieved       Plan: Continue towards umet LTGs.  Reason Skilled Services are Required: Continue to  progress to community mobility/return to prior exercise plan emphasizing high level balance, multi-sensory balance challenges, endurance and safety during functional mobility.     [place], PT 03/25/2015, 11:18 AM  [facility] [address] [place], [place], [address] Phone: [phone]   Fax:  [phone]

## 2015-03-26 ENCOUNTER — Encounter: Payer: Self-pay | Admitting: Physical Medicine & Rehabilitation

## 2015-03-26 ENCOUNTER — Encounter: Payer: Medicare HMO | Attending: Physical Medicine & Rehabilitation | Admitting: Physical Medicine & Rehabilitation

## 2015-03-26 VITALS — BP 159/82 | HR 62 | Resp 14

## 2015-03-26 DIAGNOSIS — E785 Hyperlipidemia, unspecified: Secondary | ICD-10-CM | POA: Insufficient documentation

## 2015-03-26 DIAGNOSIS — S069X2S Unspecified intracranial injury with loss of consciousness of 31 minutes to 59 minutes, sequela: Secondary | ICD-10-CM | POA: Diagnosis not present

## 2015-03-26 DIAGNOSIS — G4700 Insomnia, unspecified: Secondary | ICD-10-CM | POA: Diagnosis not present

## 2015-03-26 DIAGNOSIS — R269 Unspecified abnormalities of gait and mobility: Secondary | ICD-10-CM | POA: Insufficient documentation

## 2015-03-26 DIAGNOSIS — S02119A Unspecified fracture of occiput, initial encounter for closed fracture: Secondary | ICD-10-CM | POA: Diagnosis present

## 2015-03-26 DIAGNOSIS — M1990 Unspecified osteoarthritis, unspecified site: Secondary | ICD-10-CM | POA: Diagnosis not present

## 2015-03-26 DIAGNOSIS — D126 Benign neoplasm of colon, unspecified: Secondary | ICD-10-CM | POA: Diagnosis not present

## 2015-03-26 DIAGNOSIS — I447 Left bundle-branch block, unspecified: Secondary | ICD-10-CM | POA: Diagnosis not present

## 2015-03-26 DIAGNOSIS — N951 Menopausal and female climacteric states: Secondary | ICD-10-CM | POA: Diagnosis not present

## 2015-03-26 DIAGNOSIS — E663 Overweight: Secondary | ICD-10-CM | POA: Diagnosis not present

## 2015-03-26 DIAGNOSIS — I951 Orthostatic hypotension: Secondary | ICD-10-CM

## 2015-03-26 DIAGNOSIS — I10 Essential (primary) hypertension: Secondary | ICD-10-CM | POA: Insufficient documentation

## 2015-03-26 DIAGNOSIS — R4189 Other symptoms and signs involving cognitive functions and awareness: Secondary | ICD-10-CM | POA: Insufficient documentation

## 2015-03-26 DIAGNOSIS — S0083XA Contusion of other part of head, initial encounter: Secondary | ICD-10-CM | POA: Diagnosis not present

## 2015-03-26 DIAGNOSIS — R42 Dizziness and giddiness: Secondary | ICD-10-CM | POA: Diagnosis not present

## 2015-03-26 NOTE — Patient Instructions (Signed)
TO HELP YOUR BLOOD PRESSURE DO THE FOLLOWING:  1. CONTINUE MESTINON 2. DRINK PLENTY OF FLUIDS---64 OZ OF WATER IS OK 3. WEAR YOUR TED HOSE 4. USE YOUR ABDOMINAL BINDER 5. LET YOUR LEGS DANGLE, SIT AT THE EDGE OF THE BED/CHAIR BEFORE YOU STAND 6. DON'T "OVER DO IT" WITH WORK AT HOME 7. MAKE SURE YOU ARE SLEEPING 8 HOURS AT LEAST AT NIGHT 8. DON'T DO A LOT OF WALKING OR WORK AROUND THE HOUSE IF YOU HAVEN'T SLEPT WELL OR IF YOU DON'T FEEL WELL 9. IF SYMPTOMS PERSIST, I WOULD CONTACT YOUR CARDIOLOGIST FOR A RE-ASSESSMENT.   STOP THE TOPAMAX.  PLEASE CALL ME WITH ANY PROBLEMS OR QUESTIONS [phone]).

## 2015-03-26 NOTE — Progress Notes (Signed)
Subjective:    Patient ID: Mandy Durham, female    DOB: 07-14-1945, 70 y.o.   MRN: [id number]  HPI   Mandy Durham is here in follow up of her traumatic right sided brain injury. She has been doing quite well. She is winding down with therapy but still working on balance. She asks if she's ready to start doing some housework at home.    She notes the other day that when she was sitting down in a chair for a few minutes when she got up to hang up the phone and she felt suddenly light headed. This is the only episode she's had since she's been home. She admitted to not wearing her TEDS the day of this incident.    Pain Inventory Average Pain 0 Pain Right Now 0 My pain is no pain  In the last 24 hours, has pain interfered with the following? General activity 0 Relation with others 0 Enjoyment of life 0 What TIME of day is your pain at its worst? no pain Sleep (in general) Good  Pain is worse with: no pain Pain improves with: no pain Relief from Meds: no pain  Mobility walk without assistance ability to climb steps?  yes do you drive?  no transfers alone  Function retired  Neuro/Psych No problems in this area  Prior Studies CT/MRI hospital f/u  Physicians involved in your care hospital f/u   Family History  Problem Relation Age of Onset  . Heart failure Mother   . Coronary artery disease Mother   . Dementia Mother   . Diabetes Mother   . Colon cancer Neg Hx   . Breast cancer Neg Hx   . Hypertension Neg Hx   . Heart disease Neg Hx   . Heart attack Neg Hx   . Stroke Neg Hx    Social History   Social History  . Marital Status: Married    Spouse Name: N/A  . Number of Children: 2  . Years of Education: N/A   Occupational History  . retired Education administrator   Social History Main Topics  . Smoking status: Never Smoker   . Smokeless tobacco: Never Used  . Alcohol Use: No  . Drug Use: No  . Sexual Activity: Not Asked    Other Topics Concern  . None   Social History Narrative   HSG.  Married '65.  2 daughters, '71, '77; 4 grandchildren            Past Surgical History  Procedure Laterality Date  . Tubal ligation     Past Medical History  Diagnosis Date  . Insomnia   . Adenomatous polyp of colon   . Overweight(278.02)   . Symptomatic menopausal or female climacteric states   . Hyperlipidemia   . Osteoarthrosis, unspecified whether generalized or localized, unspecified site   . Anxiety state, unspecified   . Hypertension   . Syncope and collapse     11/12: Holter 12/12 with rare PACS, HR range 64-140, average 82, no significant arrhythmias. Echo (1/13): EF 50-55%, mild LVH, septal-lateral dyssynchrony c/w LBBB. 3-week event monitor (1/13): No significant arrhythmia.   Marland Kitchen LBBB (left bundle branch block)     LHC in 2002 showed normal coronaries.    BP 159/82 mmHg  Pulse 62  Resp 14  SpO2 96%  Opioid Risk Score:   Fall Risk Score:  `1  Depression screen PHQ 2/9  Depression screen [facility] 2/9 03/26/2015 03/22/2015 06/19/2014 05/29/2014  Decreased Interest 0 0 0 0  Down, Depressed, Hopeless 0 0 0 0  PHQ - 2 Score 0 0 0 0  Altered sleeping 0 - - -  Tired, decreased energy 0 - - -  Change in appetite 0 - - -  Feeling bad or failure about yourself  0 - - -  Trouble concentrating 0 - - -  Moving slowly or fidgety/restless 0 - - -  Suicidal thoughts 0 - - -  PHQ-9 Score 0 - - -     Review of Systems  All other systems reviewed and are negative.      Objective:   Physical Exam Constitutional: She appears well-developed and well-nourished. No distress.  HENT:  Head: Normocephalic.   Eyes: Conjunctivae and EOM are normal.  Neck: Normal range of motion.  Cardiovascular: Normal rate and regular rhythm. +Murmur Respiratory: Effort normal and breath sounds normal. No respiratory distress. She has no wheezes.  GI: Soft. Bowel sounds are normal. She exhibits no distension. There is no  tenderness.  Musculoskeletal: She exhibits no edema or tenderness.  Neuro: speech still stuttering at times--able to convey thoughts. A&O x 3. No nystagmus. Strength b/l UE 4+/5 proximal to distal  B/l LE: 4+/5 proximally to distally  She is alert and oriented to person and place  She is able to follow one and two step commands without difficulty.she ambulates without difficulty. Romberg slightly positive. Tandem gait difficult  Skin: Skin is warm and dry. No rash noted. She is not diaphoretic. No erythema.  Psychiatric: Her behavior is normal. Thought content normal.        Assessment & Plan:  1. Gait, balance, cognitive deficits secondary to traumatic right frontal hemorrhagic contusion, occipital skull fracture and SAH -continue outpatient therapies  -allowed her to do some "light" activities at home as long as she's using common sense. 2. DVT Prophylaxis/Anticoagulation: Mechanical: Sequential compression devices, below knee Bilateral lower extremities and increase mobility/activity level.  3. Pain Management: nearly resolved.  -dc topamax   4. LBBB/Supine HTN/light headedness: On metoprolol bid for supine hypertension -[place] to help with orthostatic changes .  -teds/abdominal binder, encourage fluids, extra time to sit EOB or EOC before standing.  -avoid overexertion if poor sleep or not feeling well. -vestibular therapy also -reviewed ongoing treatment and observation .  Thirty minutes of face to face patient care time were spent during this visit. All questions were encouraged and answered. I'll see her back in 3 months.

## 2015-03-27 ENCOUNTER — Telehealth: Payer: Self-pay | Admitting: Cardiology

## 2015-03-27 ENCOUNTER — Ambulatory Visit: Payer: Medicare HMO | Admitting: Physical Therapy

## 2015-03-27 ENCOUNTER — Ambulatory Visit: Payer: Medicare HMO

## 2015-03-27 DIAGNOSIS — F8089 Other developmental disorders of speech and language: Secondary | ICD-10-CM | POA: Insufficient documentation

## 2015-03-27 DIAGNOSIS — R41841 Cognitive communication deficit: Secondary | ICD-10-CM

## 2015-03-27 DIAGNOSIS — S0190XA Unspecified open wound of unspecified part of head, initial encounter: Secondary | ICD-10-CM

## 2015-03-27 DIAGNOSIS — R4701 Aphasia: Secondary | ICD-10-CM

## 2015-03-27 DIAGNOSIS — S069X9A Unspecified intracranial injury with loss of consciousness of unspecified duration, initial encounter: Secondary | ICD-10-CM

## 2015-03-27 NOTE — Telephone Encounter (Signed)
Call pt left a message to see if she has other questions. Pt was given an appointment with Dr. Aundra Dubin per Miss Rosana Hoes on Friday 03/29/15.

## 2015-03-27 NOTE — Therapy (Signed)
[facility] [address] [place], [place], [address] Phone: [phone]   Fax:  [phone]  Speech Language Pathology Treatment  Patient Details  Name: Mandy Durham MRN: [id number] Date of Birth: 21-Aug-1945 Referring Provider: Alger Simons  Encounter Date: 03/27/2015      End of Session - 03/27/15 1443    Visit Number 8   Number of Visits 17   Date for SLP Re-Evaluation 04/23/15   SLP Start Time 0850   SLP Stop Time  0931   SLP Time Calculation (min) 41 min   Activity Tolerance Patient tolerated treatment well      Past Medical History  Diagnosis Date  . Insomnia   . Adenomatous polyp of colon   . Overweight(278.02)   . Symptomatic menopausal or female climacteric states   . Hyperlipidemia   . Osteoarthrosis, unspecified whether generalized or localized, unspecified site   . Anxiety state, unspecified   . Hypertension   . Syncope and collapse     11/12: Holter 12/12 with rare PACS, HR range 64-140, average 82, no significant arrhythmias. Echo (1/13): EF 50-55%, mild LVH, septal-lateral dyssynchrony c/w LBBB. 3-week event monitor (1/13): No significant arrhythmia.   Marland Kitchen LBBB (left bundle branch block)     LHC in 2002 showed normal coronaries.     Past Surgical History  Procedure Laterality Date  . Tubal ligation      There were no vitals filed for this visit.  Visit Diagnosis: Developmental dysfluency  Cognitive communication deficit  Aphasia due to open TBI (traumatic brain injury) [facility])      Subjective Assessment - 03/27/15 0859    Subjective "I gave (Dr. Naaman Plummer) a chance to talk about me driving and going to the gym. He said something about the gym, but not the driving."   Currently in Pain? No/denies               ADULT SLP TREATMENT - 03/27/15 1020    General Information   Behavior/Cognition Alert;Cooperative;Pleasant mood   Treatment Provided   Treatment provided  Cognitive-Linquistic   Pain Assessment   Pain Assessment No/denies pain   Cognitive-Linquistic Treatment   Treatment focused on Cognition   Skilled Treatment Pt states her language is baseline with more simple conversation, but with more complex conversation she has to put more effort into wrod finding. In cognitive-ilnguistic tasks today, pt demo'd decr'd attention to detail in min-mod complex math problems requiring two steps.- 40% success, and pt did not check her answers due to decr'd awareness. SLP introduced that pt will want to check answers in more detailed tasks, SLP mentioned a new recipe, or a form at MD office, to ensure she has not made an error or missed a detail.    Assessment / Recommendations / Plan   Plan Continue with current plan of care   Progression Toward Goals   Progression toward goals Progressing toward goals          SLP Education - 03/27/15 1442    Education provided Yes   Education Details compensations for decr'd attention to detail and emergent awareness   Person(s) Educated Patient   Methods Explanation;Demonstration   Comprehension Verbalized understanding          SLP Short Term Goals - 03/27/15 1444    SLP SHORT TERM GOAL #1   Title Pt will utlize external aids for schedule and medication management over 3 sessions with rare min A   Period Weeks  Status Achieved   SLP SHORT TERM GOAL #2   Title Pt will solve mildly complex reasoning, time and money problems with 80% accuracy and rare min A   Status Achieved   SLP SHORT TERM GOAL #3   Title Pt will alternate attention between 2 simple cognitive linguistic tasks with 85% on each and occasional min A   Status Achieved   SLP SHORT TERM GOAL #4   Title Pt will name 8 items for a given category with rare min A   Status Not Met          SLP Long Term Goals - 03/27/15 0901    SLP LONG TERM GOAL #1   Title Pt will perform mildly complex financial management tasks with 90% accuracy and rare min  A   Time 3   Period Weeks   Status On-going   SLP LONG TERM GOAL #2   Title Pt will solve mildly complex reasoning, deduction, inference problems with 85% accuracy and rare min A   Time 3   Period Weeks   Status On-going   SLP LONG TERM GOAL #3   Title Pt will divide attention between 2 simple cognitive linguistic tasks with 85% on each and rare min A   Time 3   Period Weeks   Status On-going   SLP LONG TERM GOAL #4   Title pt will engage in mod complex conversation functionally for 10 minutes   Time 3   Period Weeks   Status New          Plan - 03/27/15 1443    Clinical Impression Statement Attention to detail seen as problematic for pt today. Pt reports more difficulty with speech/language in more linguisticially demanding tasks. Continue skilled ST to maximize verbal expression and cognition for improved indepednced.    Speech Therapy Frequency 2x / week   Duration --  3 weeks   Treatment/Interventions SLP instruction and feedback;Compensatory strategies;Functional tasks;Patient/family education;Language facilitation;Cognitive reorganization   Potential to Achieve Goals Good   Potential Considerations Ability to learn/carryover information;Severity of impairments        Problem List Patient Active Problem List   Diagnosis Date Noted  . Developmental dysfluency 03/27/2015  . PCP NOTES >>>> 02/13/2015  . Headache due to trauma 02/05/2015  . TBI (traumatic brain injury) ([place]) 01/28/2015  . Insomnia   . Hypoproteinemia ([place])   . Essential hypertension   . Acute post-traumatic headache, not intractable   . Skull fracture ([place])   . Vertigo due to brain injury ([place])   . Subarachnoid bleed ([place]) 01/22/2015  . SAH (subarachnoid hemorrhage) ([place]) 01/21/2015  . Annual physical exam 09/07/2014  . Orthostatic hypotension 08/30/2014  . Acromioclavicular arthrosis 03/21/2014  . Allergic rhinitis 02/27/2014  . SI (sacroiliac) joint dysfunction 02/02/2014  . Gastrocnemius  strain, left 12/11/2013  . Toenail fungus 11/15/2013  . Back pain 08/22/2013  . LBBB (left bundle branch block) 05/13/2013  . Shingles outbreak 12/15/2012  . Borderline diabetes   . Essential hypertension, benign 07/14/2012  . Syncope and collapse 02/15/2011  . Paresthesia of foot 07/07/2010  . INSOMNIA, CHRONIC, MILD 01/12/2008  . OVERWEIGHT 10/12/2007  . HLD (hyperlipidemia) 02/04/2007  . OSTEOPENIA 02/04/2007  . ANXIETY 10/12/2006  . OSTEOARTHRITIS 10/12/2006    SCHINKE,CARL , [place], CCC-SLP  03/27/2015, 2:46 PM  [facility] [address] [place], [place], [address] Phone: [phone]   Fax:  [phone]   Name: Mandy Durham MRN: [id number] Date of  Birth: 01-27-1946

## 2015-03-27 NOTE — Telephone Encounter (Signed)
New message     Patient calling had couple recent falls, was told by Dr. Naaman Plummer to make an appt sooner to discuss medication . appt made on  1.13.2017 with Dr. Aundra Dubin

## 2015-03-28 ENCOUNTER — Encounter: Payer: Self-pay | Admitting: Internal Medicine

## 2015-03-28 ENCOUNTER — Other Ambulatory Visit: Payer: Self-pay

## 2015-03-28 DIAGNOSIS — I609 Nontraumatic subarachnoid hemorrhage, unspecified: Secondary | ICD-10-CM

## 2015-03-28 DIAGNOSIS — R32 Unspecified urinary incontinence: Secondary | ICD-10-CM

## 2015-03-29 ENCOUNTER — Ambulatory Visit: Payer: Medicare HMO

## 2015-03-29 ENCOUNTER — Ambulatory Visit: Payer: Medicare HMO | Admitting: Rehabilitative and Restorative Service Providers"

## 2015-03-29 ENCOUNTER — Encounter: Payer: Self-pay | Admitting: Rehabilitative and Restorative Service Providers"

## 2015-03-29 ENCOUNTER — Encounter: Payer: Self-pay | Admitting: Cardiology

## 2015-03-29 ENCOUNTER — Ambulatory Visit (INDEPENDENT_AMBULATORY_CARE_PROVIDER_SITE_OTHER): Payer: Medicare HMO | Admitting: Cardiology

## 2015-03-29 VITALS — BP 143/74 | HR 61 | Ht 65.5 in | Wt 146.0 lb

## 2015-03-29 DIAGNOSIS — R269 Unspecified abnormalities of gait and mobility: Secondary | ICD-10-CM

## 2015-03-29 DIAGNOSIS — I447 Left bundle-branch block, unspecified: Secondary | ICD-10-CM | POA: Diagnosis not present

## 2015-03-29 DIAGNOSIS — R41841 Cognitive communication deficit: Secondary | ICD-10-CM | POA: Diagnosis not present

## 2015-03-29 DIAGNOSIS — M6281 Muscle weakness (generalized): Secondary | ICD-10-CM

## 2015-03-29 DIAGNOSIS — S069X9A Unspecified intracranial injury with loss of consciousness of unspecified duration, initial encounter: Secondary | ICD-10-CM

## 2015-03-29 DIAGNOSIS — S0190XA Unspecified open wound of unspecified part of head, initial encounter: Secondary | ICD-10-CM

## 2015-03-29 DIAGNOSIS — R4701 Aphasia: Secondary | ICD-10-CM

## 2015-03-29 DIAGNOSIS — I951 Orthostatic hypotension: Secondary | ICD-10-CM

## 2015-03-29 MED ORDER — PYRIDOSTIGMINE BROMIDE 60 MG PO TABS: ORAL_TABLET | ORAL | Status: DC

## 2015-03-29 NOTE — Therapy (Signed)
[facility] [address] [place], [place], [address] Phone: [phone]   Fax:  [phone]  Speech Language Pathology Treatment  Patient Details  Name: Mandy Durham MRN: [id number] Date of Birth: 11/07/1945 Referring Provider: Alger Simons  Encounter Date: 03/29/2015      End of Session - 03/29/15 1033    Visit Number 9   Number of Visits 17   Date for SLP Re-Evaluation 04/23/15   SLP Start Time 0935   SLP Stop Time  1016   SLP Time Calculation (min) 41 min   Activity Tolerance Patient tolerated treatment well      Past Medical History  Diagnosis Date  . Insomnia   . Adenomatous polyp of colon   . Overweight(278.02)   . Symptomatic menopausal or female climacteric states   . Hyperlipidemia   . Osteoarthrosis, unspecified whether generalized or localized, unspecified site   . Anxiety state, unspecified   . Hypertension   . Syncope and collapse     11/12: Holter 12/12 with rare PACS, HR range 64-140, average 82, no significant arrhythmias. Echo (1/13): EF 50-55%, mild LVH, septal-lateral dyssynchrony c/w LBBB. 3-week event monitor (1/13): No significant arrhythmia.   Marland Kitchen LBBB (left bundle branch block)     LHC in 2002 showed normal coronaries.     Past Surgical History  Procedure Laterality Date  . Tubal ligation      There were no vitals filed for this visit.  Visit Diagnosis: Cognitive communication deficit  Aphasia due to open TBI (traumatic brain injury) [facility])      Subjective Assessment - 03/29/15 0938    Subjective Re: homework (deductive reasoning) "I liked it!"    Currently in Pain? No/denies               ADULT SLP TREATMENT - 03/29/15 0938    General Information   Behavior/Cognition Alert;Cooperative;Pleasant mood   Treatment Provided   Treatment provided Cognitive-Linquistic   Pain Assessment   Pain Assessment No/denies pain   Cognitive-Linquistic Treatment   Treatment  focused on Cognition   Skilled Treatment Pt unaware of errors made on detailed task 2/10, however deductive reasoning (simple) tasks done 100% success. Given pt's reduced atteniton to detail, SLP targeted detailed tasks today, with repeated urging from SLP for pt to take time, look at directions carefully, and to double check answers. Pt with 100% success but SLP had to cue pt to double check answers - pt was confident she did not make errors (and she did not). Pt stated she has balanced the checkbook by hand and then double checked it with calculator and reported it was correct.   Assessment / Recommendations / Plan   Plan Continue with current plan of care   Progression Toward Goals   Progression toward goals Progressing toward goals            SLP Short Term Goals - 03/27/15 1444    SLP SHORT TERM GOAL #1   Title Pt will utlize external aids for schedule and medication management over 3 sessions with rare min A   Period Weeks   Status Achieved   SLP SHORT TERM GOAL #2   Title Pt will solve mildly complex reasoning, time and money problems with 80% accuracy and rare min A   Status Achieved   SLP SHORT TERM GOAL #3   Title Pt will alternate attention between 2 simple cognitive linguistic tasks with 85% on each and occasional min A  Status Achieved   SLP SHORT TERM GOAL #4   Title Pt will name 8 items for a given category with rare min A   Status Not Met          SLP Long Term Goals - 03/29/15 1035    SLP LONG TERM GOAL #1   Title Pt will perform mildly complex financial management tasks with 90% accuracy and rare min A   Time 3   Period Weeks   Status On-going   SLP LONG TERM GOAL #2   Title Pt will solve mildly complex reasoning, deduction, inference problems with 85% accuracy and rare min A   Time 3   Period Weeks   Status On-going   SLP LONG TERM GOAL #3   Title Pt will divide attention between 2 simple cognitive linguistic tasks with 85% on each and rare min A    Time 3   Period Weeks   Status On-going   SLP LONG TERM GOAL #4   Title pt will engage in mod complex conversation functionally for 10 minutes   Time 3   Period Weeks   Status New          Plan - 03/29/15 1033    Speech Therapy Frequency 2x / week   Duration --  3 weeks   Treatment/Interventions SLP instruction and feedback;Compensatory strategies;Functional tasks;Patient/family education;Language facilitation;Cognitive reorganization   Potential to Achieve Goals Good   Potential Considerations Ability to learn/carryover information;Severity of impairments        Problem List Patient Active Problem List   Diagnosis Date Noted  . Developmental dysfluency 03/27/2015  . PCP NOTES >>>> 02/13/2015  . Headache due to trauma 02/05/2015  . TBI (traumatic brain injury) ([place]) 01/28/2015  . Insomnia   . Hypoproteinemia ([place])   . Essential hypertension   . Acute post-traumatic headache, not intractable   . Skull fracture ([place])   . Vertigo due to brain injury ([place])   . Subarachnoid bleed ([place]) 01/22/2015  . SAH (subarachnoid hemorrhage) ([place]) 01/21/2015  . Annual physical exam 09/07/2014  . Orthostatic hypotension 08/30/2014  . Acromioclavicular arthrosis 03/21/2014  . Allergic rhinitis 02/27/2014  . SI (sacroiliac) joint dysfunction 02/02/2014  . Gastrocnemius strain, left 12/11/2013  . Toenail fungus 11/15/2013  . Back pain 08/22/2013  . LBBB (left bundle branch block) 05/13/2013  . Shingles outbreak 12/15/2012  . Borderline diabetes   . Essential hypertension, benign 07/14/2012  . Syncope and collapse 02/15/2011  . Paresthesia of foot 07/07/2010  . INSOMNIA, CHRONIC, MILD 01/12/2008  . OVERWEIGHT 10/12/2007  . HLD (hyperlipidemia) 02/04/2007  . OSTEOPENIA 02/04/2007  . ANXIETY 10/12/2006  . OSTEOARTHRITIS 10/12/2006    [place] , MS, CCC-SLP  03/29/2015, 10:37 AM  [facility] [address] [place], [place], [address] Phone: [phone]   Fax:  [phone]   Name: Mandy Durham MRN: [id number] Date of Birth: 28-Jan-1946

## 2015-03-29 NOTE — Therapy (Signed)
[facility] [address] [place], [place], [address] Phone: [phone]   Fax:  [phone]  Physical Therapy Treatment  Patient Details  Name: Mandy Durham MRN: [id number] Date of Birth: 1945-08-12 Referring Provider: Dr. Alger Simons  Encounter Date: 03/29/2015      PT End of Session - 03/29/15 1613    Visit Number 11   Number of Visits 16   Date for PT Re-Evaluation 04/11/15   Authorization Type G code every 10th visit, $40 copay   PT Start Time 1023   PT Stop Time 1055   PT Time Calculation (min) 32 min   Equipment Utilized During Treatment --   Activity Tolerance Patient tolerated treatment well   Behavior During Therapy [facility] for tasks assessed/performed      Past Medical History  Diagnosis Date  . Insomnia   . Adenomatous polyp of colon   . Overweight(278.02)   . Symptomatic menopausal or female climacteric states   . Hyperlipidemia   . Osteoarthrosis, unspecified whether generalized or localized, unspecified site   . Anxiety state, unspecified   . Hypertension   . Syncope and collapse     11/12: Holter 12/12 with rare PACS, HR range 64-140, average 82, no significant arrhythmias. Echo (1/13): EF 50-55%, mild LVH, septal-lateral dyssynchrony c/w LBBB. 3-week event monitor (1/13): No significant arrhythmia.   Marland Kitchen LBBB (left bundle branch block)     LHC in 2002 showed normal coronaries.     Past Surgical History  Procedure Laterality Date  . Tubal ligation      There were no vitals filed for this visit.  Visit Diagnosis:  Abnormality of gait  Generalized muscle weakness      Subjective Assessment - 03/29/15 1029    Subjective The patient reports she almost fell the other day when getting up quickly to hang phone up.  She reports she was having a "blacking out" episode, but her husband caught her.     Patient Stated Goals Be able to get up and walk alone.  Also wants to return to driving and  exercising.  (enjoys spin class at [employer])   Currently in Pain? No/denies      Gait: Ambulation x 10 minutes nonstop without loss of balance, fatigue or instability noted.   NEUROMUSCULAR RE-EDUCATION: Reviewed pillow standing exercise in the corner with eyes open + head turns and eyes closed x 30 second intervals Reviewed gaze x 1 adaptation exercises for HEP Discussed progression of HEP post d/c from PT Recommended patient continue current HEP and progress walking to her tolerance Recommended patient hold off on transition to community mobility due to recent episode in which she felt she may "black out"       PT Education - 03/29/15 1053    Education provided Yes   Education Details progressed pillow standing to thicker pillow or couch/sofa cushion   Person(s) Educated Patient   Methods Explanation;Demonstration;Handout   Comprehension Verbalized understanding;Returned demonstration          PT Short Term Goals - 02/26/15 1049    PT SHORT TERM GOAL #1   Title The patient will be indep with HEP for LE strenegthening and general mobility.   Baseline Met on 02/20/2015   Time 4   Period Weeks   Status Achieved   PT SHORT TERM GOAL #2   Title The patient will move sit<>stand x 5 reps (resting in between due to orthostasis) with UE support modified indep to demo  improved mobility.   Baseline Met on 02/20/2015   Time 4   Period Weeks   Status Achieved   PT SHORT TERM GOAL #3   Title The patient will improve gait speed from 1.65 ft/sec to > or equal to 2.0 ft/sec to demo decreased risk for falls.   Baseline Patient ambulates 3.5 ft/sec   Time 4   Period Weeks   Status Achieved   PT SHORT TERM GOAL #4   Title The patient will negotiate 4 steps with one handrail modified indep with reciprocal pattern.   Baseline Patient performs with one rail mod indep and with supervision with no rails.   Time 4   Period Weeks   Status Achieved   PT SHORT TERM GOAL #5   Title The  patient will improve Berg score from 38/56 to > or equal to 44/56 to demo decreasing risk for falls.   Baseline Scores 53/56 on 02/26/2015   Time 4   Period Weeks   Status Achieved           PT Long Term Goals - 03/29/15 1033    PT LONG TERM GOAL #1   Title The patient will verbalize understanding of return to community exercise.   Baseline Target date 04/11/2015   Time 8   Period Weeks   Status Achieved   PT LONG TERM GOAL #2   Title The patient will improve neuro QOL (from 40%) by 15% to demo improving quality of life/subjective measures.   Baseline Patient improved 5% from initial survey score.   Time 8   Period Weeks   Status Partially Met   PT LONG TERM GOAL #3   Title The patient will improve ait speed from 1.65 ft/sec to > or equal to 2.4 ft/sec to demo improving confidence with ambulation.   Baseline Target date 04/11/2015   Time 8   Period Weeks   Status Achieved   PT LONG TERM GOAL #4   Title The patient will improve stair negotiation x 4 steps without a handrail independently.   Baseline Met on 03/12/2015   Time 8   Period Weeks   Status Achieved   PT LONG TERM GOAL #5   Title The patient will improve Berg score from 38/56 to > or equal to 47/56.   Baseline Target date 04/11/2015   Time 8   Period Weeks   Status Achieved               Plan - 03/29/15 1126    Clinical Impression Statement The patient partially met LTGs.  She is limited in further progression of exercises and return to community exercise because of recent spell of "blacking out" sensation.  PT recommends she f/u with MD and progress based on recomendations from MD.   PT Next Visit Plan discharge today.   Consulted and Agree with Plan of Care Patient;Family member/caregiver   Family Member Consulted husband        Problem List Patient Active Problem List   Diagnosis Date Noted  . Developmental dysfluency 03/27/2015  . PCP NOTES >>>> 02/13/2015  . Headache due to trauma  02/05/2015  . TBI (traumatic brain injury) ([place]) 01/28/2015  . Insomnia   . Hypoproteinemia ([place])   . Essential hypertension   . Acute post-traumatic headache, not intractable   . Skull fracture ([place])   . Vertigo due to brain injury ([place])   . Subarachnoid bleed ([place]) 01/22/2015  . SAH (subarachnoid hemorrhage) ([place]) 01/21/2015  . Annual  physical exam 09/07/2014  . Orthostatic hypotension 08/30/2014  . Acromioclavicular arthrosis 03/21/2014  . Allergic rhinitis 02/27/2014  . SI (sacroiliac) joint dysfunction 02/02/2014  . Gastrocnemius strain, left 12/11/2013  . Toenail fungus 11/15/2013  . Back pain 08/22/2013  . LBBB (left bundle branch block) 05/13/2013  . Shingles outbreak 12/15/2012  . Borderline diabetes   . Essential hypertension, benign 07/14/2012  . Syncope and collapse 02/15/2011  . Paresthesia of foot 07/07/2010  . INSOMNIA, CHRONIC, MILD 01/12/2008  . OVERWEIGHT 10/12/2007  . HLD (hyperlipidemia) 02/04/2007  . OSTEOPENIA 02/04/2007  . ANXIETY 10/12/2006  . OSTEOARTHRITIS 10/12/2006    Synthia Fairbank, PT 03/29/2015, 7:34 PM  [facility] [address] [place], [place], [address] Phone: [phone]   Fax:  [phone]  Name: Mandy Durham MRN: [id number] Date of Birth: [birth year]

## 2015-03-29 NOTE — Patient Instructions (Addendum)
Medication Instructions:  Increase pyridostigmine (mestinon) to 90 mg three times a day. This will be 1 and 1/2 of a 60mg  tablet three times a day.   Labwork: None today  Testing/Procedures: None today  Follow-Up: Your physician recommends that you schedule a follow-up appointment in: 3 months with Dr Aundra Dubin.         If you need a refill on your cardiac medications before your next appointment, please call your pharmacy.

## 2015-03-29 NOTE — Patient Instructions (Signed)
Continue with current home program for balance exercises and walking.  Progress from standing on the pillow to using a thicker pillow or foam cushion (have family stand by for safety)

## 2015-03-29 NOTE — Patient Instructions (Signed)
Read over the recipe this weekend before you make the cookies.  Please complete the other assigned speech therapy homework and return it to your next session.

## 2015-03-29 NOTE — Therapy (Signed)
[facility] [address] [place], [place], [address] Phone: [phone]   Fax:  [phone]  Patient Details  Name: Mandy Durham MRN: [id number] Date of Birth: [birth year] Referring Provider:  No ref. provider found  Encounter Date: 03/29/2015  PHYSICAL THERAPY DISCHARGE SUMMARY  Visits from Start of Care: 11  Current functional level related to goals / functional outcomes:     PT Short Term Goals - 02/26/15 1049    PT SHORT TERM GOAL #1   Title The patient will be indep with HEP for LE strenegthening and general mobility.   Baseline Met on 02/20/2015   Time 4   Period Weeks   Status Achieved   PT SHORT TERM GOAL #2   Title The patient will move sit<>stand x 5 reps (resting in between due to orthostasis) with UE support modified indep to demo improved mobility.   Baseline Met on 02/20/2015   Time 4   Period Weeks   Status Achieved   PT SHORT TERM GOAL #3   Title The patient will improve gait speed from 1.65 ft/sec to > or equal to 2.0 ft/sec to demo decreased risk for falls.   Baseline Patient ambulates 3.5 ft/sec   Time 4   Period Weeks   Status Achieved   PT SHORT TERM GOAL #4   Title The patient will negotiate 4 steps with one handrail modified indep with reciprocal pattern.   Baseline Patient performs with one rail mod indep and with supervision with no rails.   Time 4   Period Weeks   Status Achieved   PT SHORT TERM GOAL #5   Title The patient will improve Berg score from 38/56 to > or equal to 44/56 to demo decreasing risk for falls.   Baseline Scores 53/56 on 02/26/2015   Time 4   Period Weeks   Status Achieved         PT Long Term Goals - 03/29/15 1033    PT LONG TERM GOAL #1   Title The patient will verbalize understanding of return to community exercise.   Baseline Target date 04/11/2015   Time 8   Period Weeks   Status Achieved   PT LONG TERM GOAL #2   Title The patient will improve neuro  QOL (from 40%) by 15% to demo improving quality of life/subjective measures.   Baseline Patient improved 5% from initial survey score.   Time 8   Period Weeks   Status Partially Met   PT LONG TERM GOAL #3   Title The patient will improve ait speed from 1.65 ft/sec to > or equal to 2.4 ft/sec to demo improving confidence with ambulation.   Baseline Target date 04/11/2015   Time 8   Period Weeks   Status Achieved   PT LONG TERM GOAL #4   Title The patient will improve stair negotiation x 4 steps without a handrail independently.   Baseline Met on 03/12/2015   Time 8   Period Weeks   Status Achieved   PT LONG TERM GOAL #5   Title The patient will improve Berg score from 38/56 to > or equal to 47/56.   Baseline Target date 04/11/2015   Time 8   Period Weeks   Status Achieved        Remaining deficits: Intermittent episodes of imbalance with sensation of pre-syncope Decreased high level balance, worse in multi-sensory environments    Education / Equipment: HEP, home walking, safety.  Plan: Patient  agrees to discharge.  Patient goals were partially met. Patient is being discharged due to meeting the stated rehab goals.  ?????       Thank you for the referral of this patient. Rudell Cobb, MPT  Goebel Hellums 03/29/2015, 7:43 PM  [facility] [address] [place], [place], [address] Phone: [phone]   Fax:  [phone]

## 2015-03-30 NOTE — Progress Notes (Signed)
Patient ID: Mandy Durham, female   DOB: [birth year], 70 y.o.   MRN: [id number] PCP: Dr. Linna Darner  70 yo with history of chronic LBBB and syncope returns for cardiology followup. LBBB has been noted since at least 2000.  She had a cath in 2002 because of chest pain that showed no angiographic CAD.  I had her do an echocardiogram in 1/13 showed EF 50-55% with septal-lateral dyssynchrony consistent with LBBB.   For 70 years, she has had episodes of syncope and presyncope.  She tends to get lightheaded with prolonged standing or walking at times.  Occasionally, she would get severely lightheaded and pass out or nearly pass out.  All episodes seem to have been while standing and have a prodrome of lightheadedness, nausea, and warmth.  No chest pain or tachypalpitations.  She does notice that when she drinks a lot of fluid, she does not feel as lightheaded.  No exertional dyspnea.  At a prior appointment, I started her on Florinef.  She does not think this helped much.  Her supine BP runs high and she had further episodes on Florinef.  Therefore, I had her start pyridostigmine and increased to tid.  This helped considerably initially.  However, in 71/16, she was standing at her sink and felt faint.  She passed out and hit her head => small subarachnoid hemorrhage.  She did not have to have surgery.  At last appointment, I had her wear an abdominal binder in addition to compressions stockings. She thinks that this has helped some (less lightheaded spells).  However she still has a couple of episodes a week.  No falls.   Labs (4/12): LDL 80, HDL 123, TSH normal Labs (11/12): K 4, creatinine 0.8 Labs (8/14): LDL 107, HDL 102 Labs (11/14): K 3.4, creatinine 0.7 Labs (5/15): K 3.8, creatinine 0.8, TSH normal Labs (3/16); K 4.1, creatinine 0.75 Labs (6/16): LDL 140, HDL 91 Labs (11/16): K 4, creatinine 0.88  ECG: NSR, LBBB  PMH: 1. Depression 2. Hyperlipidemia 3. Osteoarthritis 4. LBBB since around 2000.   LHC in 2002 showed normal coronaries.   5. Syncope: Holter 12/12 with rare PACS, HR range 64-140, average 82, no significant arrhythmias.  Echo (1/13): EF 50-55%, mild LVH, septal-lateral dyssynchrony c/w LBBB.  3-week event monitor (1/13): No significant arrhythmia.  EEG (11/14) normal.  Suspect autonomic insufficiency.  Fall in 11/16 with SAH (due to syncope). 6. H/o labyrinthitis  SH: Lives in [place], married, nonsmoker, works as an Biochemist, clinical.   FH: No CAD that she knows of.  No CHF.   ROS: All systems reviewed and negative except as per HPI.    Current Outpatient Prescriptions  Medication Sig Dispense Refill  . acetaminophen (TYLENOL) 325 MG tablet Take 2 tablets (650 mg total) by mouth 2 (two) times daily with breakfast and lunch. To help manage headaches during the day.    . ALPRAZolam (XANAX) 0.25 MG tablet Take 1-2 tablets (0.25-0.5 mg total) by mouth at bedtime as needed for sleep. 60 tablet 1  . calcium carbonate (OS-CAL) 600 MG TABS tablet Take 1,200 mg by mouth daily with breakfast.    . cholecalciferol (VITAMIN D) 1000 UNITS tablet Take 2,000 Units by mouth daily.     . meclizine (ANTIVERT) 12.5 MG tablet Take 1 tablet (12.5 mg total) by mouth 3 (three) times daily as needed for dizziness. 30 tablet 0  . metoprolol tartrate (LOPRESSOR) 25 MG tablet Take 1 tablet (25 mg total) by mouth 2 (two) times  daily. 180 tablet 3  . ondansetron (ZOFRAN) 4 MG tablet Take 1 tablet (4 mg total) by mouth every 8 (eight) hours as needed for nausea or vomiting. 40 tablet 0  . PARoxetine (PAXIL) 10 MG tablet Take 10 mg by mouth daily.    Marland Kitchen senna-docusate (SENOKOT-S) 8.6-50 MG tablet Take 1 tablet by mouth at bedtime. As needed for constipation    . TURMERIC PO Take 3 tablets by mouth daily.    Marland Kitchen pyridostigmine (MESTINON) 60 MG tablet 1 and 1/2 tablets (90mg ) three times a day 270 tablet 0  . [DISCONTINUED] Estradiol (ESTRACE PO) Take by mouth daily.      . [DISCONTINUED]  medroxyPROGESTERone (PROVERA) 2.5 MG tablet Take 2.5 mg by mouth daily.       No current facility-administered medications for this visit.    BP 143/74 mmHg  Pulse 61  Ht 5' 5.5" (1.664 m)  Wt 146 lb (66.225 kg)  BMI 23.92 kg/m2 General: NAD Neck: No JVD, no thyromegaly or thyroid nodule.  Lungs: Clear to auscultation bilaterally with normal respiratory effort. CV: Nondisplaced PMI.  Heart regular S1/S2, no S3/S4, no murmur.  No peripheral edema.  No carotid bruit.  Normal pedal pulses.  Abdomen: Soft, nontender, no hepatosplenomegaly, no distention.  Neurologic: Alert and oriented x 3.  Psych: Normal affect. Extremities: No clubbing or cyanosis.   Assessment/Plan: 1. Lightheadedness: Always when standing, brief in duration.  I suspect that these spells represent autonomic insufficiency/orthostatic hypotension.  Past event monitoring has shown no arrhythmia.  Florinef did not help much and BP is elevated.  Pyridostigmine had worked well, but had another syncopal episode in 11/16 with fall and subarachnoid hemorrhage. She is now wearing an abdominal binder but still has episodes of lightheadedness. - Increase pyridostigmine to 90 mg tid.  - Continue to use abdominal binder and compression stockings.  - Next step, if needed, would probably be midodrine.  However, this will risk worsening her high blood pressure. 2. LBBB: Chronic.  No ischemic symptoms.  3. Supine HTN with orthostatic hypotension: She has been on metoprolol 25 mg bid.   Followup in 3 months with me.   Loralie Champagne 71/14/2017

## 2015-04-01 NOTE — Telephone Encounter (Signed)
Pt had an appointment with Dr Aundra Dubin 03/29/15

## 2015-04-02 ENCOUNTER — Ambulatory Visit: Payer: Medicare HMO | Admitting: Speech Pathology

## 2015-04-02 ENCOUNTER — Encounter: Payer: Self-pay | Admitting: Physical Medicine & Rehabilitation

## 2015-04-02 ENCOUNTER — Ambulatory Visit: Payer: Medicare HMO | Admitting: Rehabilitative and Restorative Service Providers"

## 2015-04-02 DIAGNOSIS — R4701 Aphasia: Secondary | ICD-10-CM

## 2015-04-02 DIAGNOSIS — S0190XA Unspecified open wound of unspecified part of head, initial encounter: Secondary | ICD-10-CM

## 2015-04-02 DIAGNOSIS — G44309 Post-traumatic headache, unspecified, not intractable: Secondary | ICD-10-CM

## 2015-04-02 DIAGNOSIS — S069X9A Unspecified intracranial injury with loss of consciousness of unspecified duration, initial encounter: Secondary | ICD-10-CM

## 2015-04-02 DIAGNOSIS — R41841 Cognitive communication deficit: Secondary | ICD-10-CM | POA: Diagnosis not present

## 2015-04-02 NOTE — Therapy (Signed)
[facility] [address] [place], [place], [address] Phone: [phone]   Fax:  [phone]  Speech Language Pathology Treatment  Patient Details  Name: Mandy Durham MRN: [id number] Date of Birth: 1945/07/02 Referring Provider: Alger Simons  Encounter Date: 04/02/2015      End of Session - 04/02/15 1215    Visit Number 10   Number of Visits 17   Date for SLP Re-Evaluation 04/23/15   Authorization Type none   SLP Start Time 1017   SLP Stop Time  1100   SLP Time Calculation (min) 43 min      Past Medical History  Diagnosis Date  . Insomnia   . Adenomatous polyp of colon   . Overweight(278.02)   . Symptomatic menopausal or female climacteric states   . Hyperlipidemia   . Osteoarthrosis, unspecified whether generalized or localized, unspecified site   . Anxiety state, unspecified   . Hypertension   . Syncope and collapse     11/12: Holter 12/12 with rare PACS, HR range 64-140, average 82, no significant arrhythmias. Echo (1/13): EF 50-55%, mild LVH, septal-lateral dyssynchrony c/w LBBB. 3-week event monitor (1/13): No significant arrhythmia.   Marland Kitchen LBBB (left bundle branch block)     LHC in 2002 showed normal coronaries.     Past Surgical History  Procedure Laterality Date  . Tubal ligation      There were no vitals filed for this visit.  Visit Diagnosis: Cognitive communication deficit  Aphasia due to open TBI (traumatic brain injury) ([place])      Subjective Assessment - 04/02/15 1023    Subjective "I got took off my headache medicine then I got a headache for 4 days and didn't feel like baking (her homework)   Currently in Pain? No/denies               ADULT SLP TREATMENT - 04/02/15 1023    General Information   Behavior/Cognition Alert;Cooperative;Pleasant mood   Treatment Provided   Treatment provided Cognitive-Linquistic   Pain Assessment   Pain Assessment No/denies pain   Cognitive-Linquistic Treatment   Treatment focused on Cognition   Skilled Treatment Homework complete and correct - Divided attention between card sort into 2 piles with 2 complex rules for each with simple conversation with extended time - with rare min A, 85% accurate - Complex naming  2  synonyms for lower frequency words with minimal extended time and supervision cues. I brought spouse back - he reports pt is managing personal and buisness accounts accurately at home, transferring and balancing accurately. She is managing her calendar and meds at home without difficulty at this time per pt and spouse   Assessment / Recommendations / [place] with current plan of care   Progression Toward Goals   Progression toward goals Progressing toward goals          SLP Education - 04/02/15 1211    Education provided Yes   Education Details progress in therapy - goals remaining   Person(s) Educated Patient;Spouse   Methods Explanation;Demonstration   Comprehension Verbalized understanding          SLP Short Term Goals - 04/02/15 1214    SLP SHORT TERM GOAL #1   Title Pt will utlize external aids for schedule and medication management over 3 sessions with rare min A   Period Weeks   Status Achieved   SLP SHORT TERM GOAL #2   Title Pt will solve mildly complex  reasoning, time and money problems with 80% accuracy and rare min A   Status Achieved   SLP SHORT TERM GOAL #3   Title Pt will alternate attention between 2 simple cognitive linguistic tasks with 85% on each and occasional min A   Status Achieved   SLP SHORT TERM GOAL #4   Title Pt will name 8 items for a given category with rare min A   Status Not Met          SLP Long Term Goals - 04-03-15 1214    SLP LONG TERM GOAL #1   Title Pt will perform mildly complex financial management tasks with 90% accuracy and rare min A   Time 3   Period Weeks   Status Achieved   SLP LONG TERM GOAL #2   Title Pt will solve mildly  complex reasoning, deduction, inference problems with 85% accuracy and rare min A   Time 2   Period Weeks   Status On-going   SLP LONG TERM GOAL #3   Title Pt will divide attention between 2 simple cognitive linguistic tasks with 85% on each and rare min A   Time 2   Period Weeks   Status On-going   SLP LONG TERM GOAL #4   Title pt will engage in mod complex conversation functionally for 10 minutes   Time 2   Period Weeks   Status New          Plan - 04-03-2015 1212    Clinical Impression Statement Pt with improved divided attention today with occasional min cues, and attention to detail with extended time. Pt managing finances at home with rare min A. Continue skilled ST to maximize attention for improved independence and to reduce caregiver burden   Speech Therapy Frequency 2x / week   Treatment/Interventions SLP instruction and feedback;Compensatory strategies;Functional tasks;Patient/family education;Language facilitation;Cognitive reorganization   Potential to Achieve Goals Good   Potential Considerations Ability to learn/carryover information;Severity of impairments   Consulted and Agree with Plan of Care Patient     Speech Therapy Progress Note  Dates of Reporting Period: 02/26/16 to 04-03-15  Objective Reports of Subjective Statement: Pt and spouse report pt increasing activities that require divided attention (cooking, chores) with min A  Objective Measurements:  Pt managing schedule, calendar and meds with min A at home, with min A. Word finding episodes out side of therapy persist in novel situations     Goal Update: Continue goals as above  Plan: [place] POC  Reason Skilled Services are Required: To maximize verbal expression at complex conversation in community, maximize divided attention/attention to detail for improved independence and safety       G-Codes - 04/03/2015 1216    Functional Assessment Tool Used NOMS   Functional Limitations Attention    Attention Current Status (E5277) At least 20 percent but less than 40 percent impaired, limited or restricted   Attention Goal Status (O2423) At least 20 percent but less than 40 percent impaired, limited or restricted      Problem List Patient Active Problem List   Diagnosis Date Noted  . Developmental dysfluency 03/27/2015  . PCP NOTES >>>> 02/13/2015  . Headache due to trauma 02/05/2015  . TBI (traumatic brain injury) ([place]) 01/28/2015  . Insomnia   . Hypoproteinemia ([place])   . Essential hypertension   . Acute post-traumatic headache, not intractable   . Skull fracture ([place])   . Vertigo due to brain injury ([place])   . Subarachnoid bleed ([place])  01/22/2015  . SAH (subarachnoid hemorrhage) ([place]) 01/21/2015  . Annual physical exam 09/07/2014  . Orthostatic hypotension 08/30/2014  . Acromioclavicular arthrosis 03/21/2014  . Allergic rhinitis 02/27/2014  . SI (sacroiliac) joint dysfunction 02/02/2014  . Gastrocnemius strain, left 12/11/2013  . Toenail fungus 11/15/2013  . Back pain 08/22/2013  . LBBB (left bundle branch block) 05/13/2013  . Shingles outbreak 12/15/2012  . Borderline diabetes   . Essential hypertension, benign 07/14/2012  . Syncope and collapse 02/15/2011  . Paresthesia of foot 07/07/2010  . INSOMNIA, CHRONIC, MILD 01/12/2008  . OVERWEIGHT 10/12/2007  . HLD (hyperlipidemia) 02/04/2007  . OSTEOPENIA 02/04/2007  . ANXIETY 10/12/2006  . OSTEOARTHRITIS 10/12/2006    Waymond Meador, Annye Rusk MS, CCC-SLP 04/02/2015, 12:17 PM  [facility] [address] [place], [place], [address] Phone: [phone]   Fax:  [phone]   Name: Mandy Durham MRN: [id number] Date of Birth: 25-Aug-1945

## 2015-04-03 ENCOUNTER — Ambulatory Visit: Payer: Medicare HMO | Admitting: Physician Assistant

## 2015-04-03 MED ORDER — TOPIRAMATE 25 MG PO TABS: 25.0000 mg | ORAL_TABLET | Freq: Every day | ORAL | Status: DC

## 2015-04-03 NOTE — Telephone Encounter (Signed)
Please advise on continuing the Topiramate?

## 2015-04-03 NOTE — Telephone Encounter (Signed)
topamax refilled

## 2015-04-04 ENCOUNTER — Ambulatory Visit: Payer: Medicare HMO | Admitting: Rehabilitative and Restorative Service Providers"

## 2015-04-04 ENCOUNTER — Ambulatory Visit: Payer: Medicare HMO | Admitting: Speech Pathology

## 2015-04-04 DIAGNOSIS — R41841 Cognitive communication deficit: Secondary | ICD-10-CM | POA: Diagnosis not present

## 2015-04-04 NOTE — Therapy (Signed)
[facility] [address] [place], [place], [address] Phone: [phone]   Fax:  [phone]  Speech Language Pathology Treatment  Patient Details  Name: Mandy Durham MRN: [id number] Date of Birth: 14-Sep-1945 Referring Provider: Alger Simons  Encounter Date: 04/04/2015      End of Session - 04/04/15 1206    Visit Number 11   Number of Visits 17   Date for SLP Re-Evaluation 04/23/15   SLP Start Time 1016   SLP Stop Time  1100   SLP Time Calculation (min) 44 min      Past Medical History  Diagnosis Date  . Insomnia   . Adenomatous polyp of colon   . Overweight(278.02)   . Symptomatic menopausal or female climacteric states   . Hyperlipidemia   . Osteoarthrosis, unspecified whether generalized or localized, unspecified site   . Anxiety state, unspecified   . Hypertension   . Syncope and collapse     11/12: Holter 12/12 with rare PACS, HR range 64-140, average 82, no significant arrhythmias. Echo (1/13): EF 50-55%, mild LVH, septal-lateral dyssynchrony c/w LBBB. 3-week event monitor (1/13): No significant arrhythmia.   Marland Kitchen LBBB (left bundle branch block)     LHC in 2002 showed normal coronaries.     Past Surgical History  Procedure Laterality Date  . Tubal ligation      There were no vitals filed for this visit.  Visit Diagnosis: Cognitive communication deficit      Subjective Assessment - 04/04/15 1022    Subjective "I've had 2 headaches I think"               ADULT SLP TREATMENT - 04/04/15 1023    General Information   Behavior/Cognition Alert;Cooperative;Pleasant mood   Treatment Provided   Treatment provided Cognitive-Linquistic   Pain Assessment   Pain Assessment No/denies pain   Cognitive-Linquistic Treatment   Treatment focused on Cognition   Skilled Treatment Simple to moderately complex deduction/reasoning puzzle with usual mod A, task decreased to simple to mildly complex  reasoning/deduction tasks with  80% accuracy and occasional min A. Pt required occasoinal min verbal cues to attend to details, especially when having to attend to 2 pieces of information in 1 clue - pt usually attended to only one clue/detail.     Assessment / Recommendations / Plan   Plan Continue with current plan of care   Progression Toward Goals   Progression toward goals Progressing toward goals            SLP Short Term Goals - 04/04/15 1205    SLP SHORT TERM GOAL #1   Title Pt will utlize external aids for schedule and medication management over 3 sessions with rare min A   Period Weeks   Status Achieved   SLP SHORT TERM GOAL #2   Title Pt will solve mildly complex reasoning, time and money problems with 80% accuracy and rare min A   Status Achieved   SLP SHORT TERM GOAL #3   Title Pt will alternate attention between 2 simple cognitive linguistic tasks with 85% on each and occasional min A   Status Achieved   SLP SHORT TERM GOAL #4   Title Pt will name 8 items for a given category with rare min A   Status Not Met          SLP Long Term Goals - 04/04/15 1205    SLP LONG TERM GOAL #1   Title Pt will perform mildly  complex financial management tasks with 90% accuracy and rare min A   Time 3   Period Weeks   Status Achieved   SLP LONG TERM GOAL #2   Title Pt will solve mildly complex reasoning, deduction, inference problems with 85% accuracy and rare min A   Time 2   Period Weeks   Status On-going   SLP LONG TERM GOAL #3   Title Pt will divide attention between 2 simple cognitive linguistic tasks with 85% on each and rare min A   Time 2   Period Weeks   Status On-going   SLP LONG TERM GOAL #4   Title pt will engage in mod complex conversation functionally for 10 minutes   Time 2   Period Weeks   Status New          Plan - 04/04/15 1205    Clinical Impression Statement Pt with improved divided attention today with occasional min cues, and attention to  detail with extended time. Pt managing finances at home with rare min A. Continue skilled ST to maximize attention for improved independence and to reduce caregiver burden        Problem List Patient Active Problem List   Diagnosis Date Noted  . Developmental dysfluency 03/27/2015  . PCP NOTES >>>> 02/13/2015  . Headache due to trauma 02/05/2015  . TBI (traumatic brain injury) ([place]) 01/28/2015  . Insomnia   . Hypoproteinemia ([place])   . Essential hypertension   . Acute post-traumatic headache, not intractable   . Skull fracture ([place])   . Vertigo due to brain injury ([place])   . Subarachnoid bleed ([place]) 01/22/2015  . SAH (subarachnoid hemorrhage) ([place]) 01/21/2015  . Annual physical exam 09/07/2014  . Orthostatic hypotension 08/30/2014  . Acromioclavicular arthrosis 03/21/2014  . Allergic rhinitis 02/27/2014  . SI (sacroiliac) joint dysfunction 02/02/2014  . Gastrocnemius strain, left 12/11/2013  . Toenail fungus 11/15/2013  . Back pain 08/22/2013  . LBBB (left bundle branch block) 05/13/2013  . Shingles outbreak 12/15/2012  . Borderline diabetes   . Essential hypertension, benign 07/14/2012  . Syncope and collapse 02/15/2011  . Paresthesia of foot 07/07/2010  . INSOMNIA, CHRONIC, MILD 01/12/2008  . OVERWEIGHT 10/12/2007  . HLD (hyperlipidemia) 02/04/2007  . OSTEOPENIA 02/04/2007  . ANXIETY 10/12/2006  . OSTEOARTHRITIS 10/12/2006    Aradia Estey, Annye Rusk MS, CCC-SLP 04/04/2015, 12:06 PM  [facility] [address] [place], [place], [address] Phone: [phone]   Fax:  [phone]   Name: Mandy Durham MRN: [id number] Date of Birth: September 29, 1945

## 2015-04-08 ENCOUNTER — Telehealth: Payer: Self-pay | Admitting: *Deleted

## 2015-04-08 DIAGNOSIS — G44309 Post-traumatic headache, unspecified, not intractable: Secondary | ICD-10-CM

## 2015-04-08 DIAGNOSIS — G4700 Insomnia, unspecified: Secondary | ICD-10-CM

## 2015-04-08 NOTE — Telephone Encounter (Signed)
Pt called regarding her mychart e-mail message that she sent to you. She says she has not heard back

## 2015-04-08 NOTE — Telephone Encounter (Signed)
I see no my chart email message from her.  Can you follow up on the question?  thanks

## 2015-04-09 ENCOUNTER — Ambulatory Visit: Payer: Medicare HMO | Admitting: Speech Pathology

## 2015-04-09 ENCOUNTER — Ambulatory Visit: Payer: Medicare HMO | Admitting: Physical Therapy

## 2015-04-09 DIAGNOSIS — R4701 Aphasia: Principal | ICD-10-CM

## 2015-04-09 DIAGNOSIS — S0190XA Unspecified open wound of unspecified part of head, initial encounter: Secondary | ICD-10-CM

## 2015-04-09 DIAGNOSIS — R41841 Cognitive communication deficit: Secondary | ICD-10-CM | POA: Diagnosis not present

## 2015-04-09 DIAGNOSIS — S069X9A Unspecified intracranial injury with loss of consciousness of unspecified duration, initial encounter: Principal | ICD-10-CM

## 2015-04-09 MED ORDER — QUETIAPINE FUMARATE 25 MG PO TABS: 25.0000 mg | ORAL_TABLET | Freq: Every evening | ORAL | Status: DC | PRN

## 2015-04-09 MED ORDER — TOPIRAMATE 25 MG PO TABS: 25.0000 mg | ORAL_TABLET | Freq: Every day | ORAL | Status: DC

## 2015-04-09 NOTE — Telephone Encounter (Signed)
Both meds refilled

## 2015-04-09 NOTE — Telephone Encounter (Signed)
Patient called, message left informing medications were refilled

## 2015-04-09 NOTE — Patient Instructions (Signed)
Cook dinner with husband home  Go to church and prayer group

## 2015-04-09 NOTE — Telephone Encounter (Signed)
This is her message:       Dr. Naaman Plummer,  You asked me at the office visit if I was having headaches and I said no and you said to stop the Topiramate, 25mg , for managing headaches. After 2 days off the med, I started having headaches again and starting taking the medicine again and the headaches are much better.   I should have contacted you last Friday but what do you want me to do about taking this medicine?  If I am to keep taking it, I will need it refilled at [facility].  And, I also need the Quetiapine Fumarate, 25mg .Marland KitchenMarland KitchenThis is also no refills left. Use [facility].   Thank you,  Mandy Durham  DOB Jul 08, 2045

## 2015-04-09 NOTE — Therapy (Signed)
[facility] [address] [place], [place], [address] Phone: [phone]   Fax:  [phone]  Speech Language Pathology Treatment  Patient Details  Name: Mandy Durham MRN: [id number] Date of Birth: 1945-10-26 Referring Provider: Alger Simons  Encounter Date: 04/09/2015      End of Session - 04/09/15 1017    Visit Number 12   Number of Visits 17   Date for SLP Re-Evaluation 04/23/15   SLP Start Time 0933   SLP Stop Time  1017   SLP Time Calculation (min) 44 min   Activity Tolerance Patient tolerated treatment well      Past Medical History  Diagnosis Date  . Insomnia   . Adenomatous polyp of colon   . Overweight(278.02)   . Symptomatic menopausal or female climacteric states   . Hyperlipidemia   . Osteoarthrosis, unspecified whether generalized or localized, unspecified site   . Anxiety state, unspecified   . Hypertension   . Syncope and collapse     11/12: Holter 12/12 with rare PACS, HR range 64-140, average 82, no significant arrhythmias. Echo (1/13): EF 50-55%, mild LVH, septal-lateral dyssynchrony c/w LBBB. 3-week event monitor (1/13): No significant arrhythmia.   Marland Kitchen LBBB (left bundle branch block)     LHC in 2002 showed normal coronaries.     Past Surgical History  Procedure Laterality Date  . Tubal ligation      There were no vitals filed for this visit.  Visit Diagnosis: Aphasia due to open TBI (traumatic brain injury) ([place])  Cognitive communication deficit      Subjective Assessment - 04/09/15 0942    Subjective "I just couldn't finish this puzzle - deduction homework"   Currently in Pain? No/denies               ADULT SLP TREATMENT - 04/09/15 0943    General Information   Behavior/Cognition Alert;Cooperative;Pleasant mood   Treatment Provided   Treatment provided Cognitive-Linquistic   Pain Assessment   Pain Assessment No/denies pain   Cognitive-Linquistic Treatment   Treatment focused on Cognition   Skilled Treatment Moderately complex deduction/reasoning homework checked - 85% correct - missing 2 answers - pt required usual mod A to complete the homework. Moderately complex reasoning task with extended time and  90% accuracy rare min A. Moderately complex conversation re: re- entering her prior activities such as church  and exercise and visiting friends/prayer group.  I encouraged pt to start attending her activities in the community. Her attention and language have improved. Mrs. Rieves  reports her main difficulty with word finding is over the phone.  Pt utlized compensations for aphasia during structured tasks with mod I.     Assessment / Recommendations / Plan   Plan Continue with current plan of care   Progression Toward Goals   Progression toward goals Progressing toward goals          SLP Education - 04/09/15 1013    Education provided Yes          SLP Short Term Goals - 04/09/15 1016    SLP SHORT TERM GOAL #1   Title Pt will utlize external aids for schedule and medication management over 3 sessions with rare min A   Period Weeks   Status Achieved   SLP SHORT TERM GOAL #2   Title Pt will solve mildly complex reasoning, time and money problems with 80% accuracy and rare min A   Status Achieved   SLP SHORT TERM GOAL #  3   Title Pt will alternate attention between 2 simple cognitive linguistic tasks with 85% on each and occasional min A   Status Achieved   SLP SHORT TERM GOAL #4   Title Pt will name 8 items for a given category with rare min A   Status Not Met          SLP Long Term Goals - 04/09/15 1016    SLP LONG TERM GOAL #1   Title Pt will perform mildly complex financial management tasks with 90% accuracy and rare min A   Time 3   Period Weeks   Status Achieved   SLP LONG TERM GOAL #2   Title Pt will solve mildly complex reasoning, deduction, inference problems with 85% accuracy and rare min A   Time 2   Period Weeks    Status Achieved   SLP LONG TERM GOAL #3   Title Pt will divide attention between 2 simple cognitive linguistic tasks with 85% on each and rare min A   Time 2   Period Weeks   Status On-going   SLP LONG TERM GOAL #4   Title pt will engage in mod complex conversation functionally for 10 minutes   Time 2   Period Weeks   Status New          Plan - 04/09/15 1013    Clinical Impression Statement Pt continues to improve, language at conversation level required minimal compensations. Reasoning/problem solving goal met. Continue skilled ST 1 more session to maximize carryover of compensations for verbal expression.    Speech Therapy Frequency 2x / week   Treatment/Interventions SLP instruction and feedback;Compensatory strategies;Functional tasks;Patient/family education;Language facilitation;Cognitive reorganization   Potential to Achieve Goals Good   Consulted and Agree with Plan of Care Patient        Problem List Patient Active Problem List   Diagnosis Date Noted  . Developmental dysfluency 03/27/2015  . PCP NOTES >>>> 02/13/2015  . Headache due to trauma 02/05/2015  . TBI (traumatic brain injury) ([place]) 01/28/2015  . Insomnia   . Hypoproteinemia ([place])   . Essential hypertension   . Acute post-traumatic headache, not intractable   . Skull fracture ([place])   . Vertigo due to brain injury ([place])   . Subarachnoid bleed ([place]) 01/22/2015  . SAH (subarachnoid hemorrhage) ([place]) 01/21/2015  . Annual physical exam 09/07/2014  . Orthostatic hypotension 08/30/2014  . Acromioclavicular arthrosis 03/21/2014  . Allergic rhinitis 02/27/2014  . SI (sacroiliac) joint dysfunction 02/02/2014  . Gastrocnemius strain, left 12/11/2013  . Toenail fungus 11/15/2013  . Back pain 08/22/2013  . LBBB (left bundle branch block) 05/13/2013  . Shingles outbreak 12/15/2012  . Borderline diabetes   . Essential hypertension, benign 07/14/2012  . Syncope and collapse 02/15/2011  . Paresthesia of foot  07/07/2010  . INSOMNIA, CHRONIC, MILD 01/12/2008  . OVERWEIGHT 10/12/2007  . HLD (hyperlipidemia) 02/04/2007  . OSTEOPENIA 02/04/2007  . ANXIETY 10/12/2006  . OSTEOARTHRITIS 10/12/2006    Lovvorn, Annye Rusk MS, CCC-SLP 04/09/2015, 10:17 AM  [facility] [address] [place], [place], [address] Phone: [phone]   Fax:  [phone]   Name: Dawt ALLIE OUSLEY MRN: [id number] Date of Birth: Feb 10, 1946

## 2015-04-11 ENCOUNTER — Ambulatory Visit: Payer: Medicare HMO | Admitting: Rehabilitative and Restorative Service Providers"

## 2015-04-11 ENCOUNTER — Ambulatory Visit: Payer: Medicare HMO | Admitting: Speech Pathology

## 2015-04-11 ENCOUNTER — Telehealth: Payer: Self-pay

## 2015-04-11 ENCOUNTER — Telehealth: Payer: Self-pay | Admitting: *Deleted

## 2015-04-11 DIAGNOSIS — R41841 Cognitive communication deficit: Secondary | ICD-10-CM | POA: Diagnosis not present

## 2015-04-11 NOTE — Telephone Encounter (Signed)
Calling about refill on her seroquel.  I left message that her medications had been refilled from a previous email she sent.  She should check with her pharmacy.

## 2015-04-11 NOTE — Therapy (Signed)
[facility] [address] [place], [place], [address] Phone: [phone]   Fax:  [phone]  Speech Language Pathology Treatment  Patient Details  Name: Mandy Durham MRN: [id number] Date of Birth: Oct 11, 1945 Referring Provider: Faith Rogue  Encounter Date: 04/11/2015      End of Session - 04/11/15 1151    SLP Start Time 0930   SLP Stop Time  1015   SLP Time Calculation (min) 45 min      Past Medical History  Diagnosis Date  . Insomnia   . Adenomatous polyp of colon   . Overweight(278.02)   . Symptomatic menopausal or female climacteric states   . Hyperlipidemia   . Osteoarthrosis, unspecified whether generalized or localized, unspecified site   . Anxiety state, unspecified   . Hypertension   . Syncope and collapse     11/12: Holter 12/12 with rare PACS, HR range 64-140, average 82, no significant arrhythmias. Echo (1/13): EF 50-55%, mild LVH, septal-lateral dyssynchrony c/w LBBB. 3-week event monitor (1/13): No significant arrhythmia.   Marland Kitchen LBBB (left bundle branch block)     LHC in 2002 showed normal coronaries.     Past Surgical History  Procedure Laterality Date  . Tubal ligation      There were no vitals filed for this visit.  Visit Diagnosis: Cognitive communication deficit      Subjective Assessment - 04/11/15 0938    Subjective "I made spaghetti and it was good"   Currently in Pain? No/denies               ADULT SLP TREATMENT - 04/11/15 0001    General Information   Behavior/Cognition Alert;Cooperative;Pleasant mood   Treatment Provided   Treatment provided Cognitive-Linquistic   Pain Assessment   Pain Assessment No/denies pain   Cognitive-Linquistic Treatment   Treatment focused on Cognition   Skilled Treatment Divided attention with midly complex naming task while verbalizing bills and coins that add up to an amout called out by the ST.  Pt demonstrated divided attention with  90% on each with rare min repetition.  Moderately complex conversation with 1 word finding episode, in which pt got word out as she was using compensations.    Assessment / Recommendations / Plan   Plan Discharge SLP treatment due to (comment)   Progression Toward Goals   Progression toward goals Goals met, education completed, patient discharged from SLP     SPEECH THERAPY DISCHARGE SUMMARY  Visits from Start of Care: 13  Current functional level related to goals / functional outcomes: See goals below   Remaining deficits: Mild anomia   Education / Equipment: Compensations for aphasia and cognitive impairments Plan: Patient agrees to discharge.  Patient goals were met. Patient is being discharged due to meeting the stated rehab goals.  ?????            SLP Short Term Goals - 04/11/15 0956    SLP SHORT TERM GOAL #1   Title Pt will utlize external aids for schedule and medication management over 3 sessions with rare min A   Period Weeks   Status Achieved   SLP SHORT TERM GOAL #2   Title Pt will solve mildly complex reasoning, time and money problems with 80% accuracy and rare min A   Status Achieved   SLP SHORT TERM GOAL #3   Title Pt will alternate attention between 2 simple cognitive linguistic tasks with 85% on each and occasional min A   Status Achieved  SLP SHORT TERM GOAL #4   Title Pt will name 8 items for a given category with rare min A   Status Not Met          SLP Long Term Goals - 08-May-2015 0956    SLP LONG TERM GOAL #1   Title Pt will perform mildly complex financial management tasks with 90% accuracy and rare min A   Time 3   Period Weeks   Status Achieved   SLP LONG TERM GOAL #2   Title Pt will solve mildly complex reasoning, deduction, inference problems with 85% accuracy and rare min A   Time 2   Period Weeks   Status Achieved   SLP LONG TERM GOAL #3   Title Pt will divide attention between 2 simple cognitive linguistic tasks with 85% on  each and rare min A   Time 2   Period Weeks   Status Achieved   SLP LONG TERM GOAL #4   Title pt will engage in mod complex conversation functionally for 10 minutes   Time 2   Period Weeks   Status Achieved          Plan - May 08, 2015 0959    Clinical Impression Statement Pt has met ST goals, Recommend pt be d/'c from [facility]. Pt had success cooking dinner at home without difficulty or incident. Encouraged her to start attending church, prayer group and visiting her friends. Pt to ask Dr. Naaman Plummer about driving and exercising. She is quite cautious and demonstrating  good safety and decision making at this time.           G-Codes - 08-May-2015 0956    Functional Assessment Tool Used NOMS   Functional Limitations Attention   Attention Goal Status [phone]) At least 20 percent but less than 40 percent impaired, limited or restricted   Attention Discharge Status [phone]) At least 1 percent but less than 20 percent impaired, limited or restricted      Problem List Patient Active Problem List   Diagnosis Date Noted  . Developmental dysfluency 03/27/2015  . PCP NOTES >>>> 02/13/2015  . Headache due to trauma 02/05/2015  . TBI (traumatic brain injury) ([place]) 01/28/2015  . Insomnia   . Hypoproteinemia ([place])   . Essential hypertension   . Acute post-traumatic headache, not intractable   . Skull fracture ([place])   . Vertigo due to brain injury ([place])   . Subarachnoid bleed ([place]) 01/22/2015  . SAH (subarachnoid hemorrhage) ([place]) 01/21/2015  . Annual physical exam 09/07/2014  . Orthostatic hypotension 08/30/2014  . Acromioclavicular arthrosis 03/21/2014  . Allergic rhinitis 02/27/2014  . SI (sacroiliac) joint dysfunction 02/02/2014  . Gastrocnemius strain, left 12/11/2013  . Toenail fungus 11/15/2013  . Back pain 08/22/2013  . LBBB (left bundle branch block) 05/13/2013  . Shingles outbreak 12/15/2012  . Borderline diabetes   . Essential hypertension, benign 07/14/2012  . Syncope and collapse  02/15/2011  . Paresthesia of foot 07/07/2010  . INSOMNIA, CHRONIC, MILD 01/12/2008  . OVERWEIGHT 10/12/2007  . HLD (hyperlipidemia) 02/04/2007  . OSTEOPENIA 02/04/2007  . ANXIETY 10/12/2006  . OSTEOARTHRITIS 10/12/2006    Lovvorn, Annye Rusk MS, CCC-SLP 2015-05-08, 12:03 PM   [facility] [address] [place], [place], [address] Phone: [phone]   Fax:  [phone]   Name: Mandy Durham MRN: [id number] Date of Birth: 01-26-1946

## 2015-04-11 NOTE — Telephone Encounter (Signed)
Pt wanted to let you know that she completed out-patient neuro rehab and speech therapy this morning. She would like to know if you think it is okay for her to start driving again. Please advise.

## 2015-04-12 NOTE — Telephone Encounter (Signed)
Speech therapy indicates that she's doing well and using good safety judgement. If she drives, it would be according to the following plan: RETURN TO DRIVING PLAN:  WITH THE SUPERVISION OF A LICENSED DRIVER, PLEASE DRIVE IN AN EMPTY PARKING LOT FOR AT LEAST 2-3 TRIALS TO TEST REACTION TIME, VISION, USE OF EQUIPMENT IN CAR, ETC.  IF SUCCESSFUL WITH THE PARKING LOT DRIVING, PROCEED TO SUPERVISED DRIVING TRIALS IN YOUR NEIGHBORHOOD STREETS AT [place] TO TEST OBSERVATION TO TRAFFIC SIGNALS, REACTION TIME, ETC. PLEASE ATTEMPT AT LEAST 2-3 TRIALS IN YOUR NEIGHBORHOOD.  IF NEIGHBORHOOD DRIVING IS SUCCESSFUL, YOU MAY PROCEED TO DRIVING IN BUSIER AREAS IN YOUR COMMUNITY WITH SUPERVISION OF A LICENSED DRIVER. PLEASE ATTEMPT AT LEAST 4-5 TRIALS.  IF COMMUNITY DRIVING IS SUCCESSFUL, YOU MAY PROCEED TO DRIVING ALONE, DURING THE DAY TIME, IN NON-PEAK TRAFFIC TIMES. YOU SHOULD DRIVE NO FURTHER THAN 15 MINUTES IN ONE DIRECTION. PLEASE DO NOT DRIVE IF YOU FEEL FATIGUED OR UNDER THE INFLUENCE OF MEDICATION.

## 2015-04-12 NOTE — Telephone Encounter (Signed)
Letter mailed through my chart and through [place] mail as well with these directions.  O[facility] was notified by Gara Kroner CMA

## 2015-04-23 ENCOUNTER — Encounter: Payer: Self-pay | Admitting: Neurology

## 2015-04-23 ENCOUNTER — Ambulatory Visit (INDEPENDENT_AMBULATORY_CARE_PROVIDER_SITE_OTHER): Payer: Medicare HMO | Admitting: Neurology

## 2015-04-23 VITALS — BP 124/82 | HR 66 | Resp 20 | Ht 65.0 in | Wt 143.0 lb

## 2015-04-23 DIAGNOSIS — R159 Full incontinence of feces: Secondary | ICD-10-CM | POA: Diagnosis not present

## 2015-04-23 DIAGNOSIS — I609 Nontraumatic subarachnoid hemorrhage, unspecified: Secondary | ICD-10-CM | POA: Diagnosis not present

## 2015-04-23 MED ORDER — PROBIOTIC 250 MG PO CAPS: 250.0000 mg | ORAL_CAPSULE | Freq: Two times a day (BID) | ORAL | Status: DC

## 2015-04-23 NOTE — Progress Notes (Signed)
[facility]   Provider:  Larey Seat, M D  Referring Provider: Colon Branch, MD Primary Care Physician:  Kathlene November, MD  Chief Complaint  Patient presents with  . New Patient (Initial Visit)    had a fall, then a stroke, SAH, has gone to rehab, was having bowel and urine leakage/incontinence, but this has resolved, still wanted to follow with neurology    HPI:  Mandy Durham is a 70 y.o. female , seen here as a referral  from Dr. Kathlene November for a subarachnoid traumatic bleed , sequelae evaluation.   Mandy Durham had originally asked for this appointment due to incontinence following the [facility]. She had fainted, fell and aquiered the [facility] .  Unrelated to the fall she had noted foul smells of gas, and her husband commented on it, she lost temporary control of bowel and bladder function.  Mandy Durham also states that her own olfactory sense has been impaired.  She used to be a patient of the now retired physician Dr. Adella Hare and the loss of smell had been noted more than 10 years ago. She fell in 2005 and fractured her nose. Her olfactory sense was damaged. She states that the incontinence  is improved. She still has some urine leakage and soft stools. She wonders if this is still and neurologic impairment from the [facility]. She  has no pain with bowel movements, currently no headaches or nausea. No insomnia anxiety symptoms are controlled with Xanax. She reports no lower extremity numbness or weakness foot drop or shuffling gait she is able to master stairs and to turn without getting dizzy. She has a history of syncope and collapse a Holter monitor in December 2012 revealed rare PACs and echo in January 2013 an EF of 55% with mild left ventricular hypertrophy left bundle branch block she had no significant arrhythmia on a 3 week event monitor or normal coronary artery function no chest pain. She remains on Mestinon to protect her from fainting spells.  Sleep habits are as follows:  She has  an established bed time at 10 Pm and rises at 7.30, feels refreshed in the morning. Shares the bedroom with her husband.  Social history: the patient has been married for 52 years, has never used tobacco products and never ETOH, she drinks iced teas and soda, but not daily, 2 a week.  Retired Optometrist.   Review of Systems: Out of a complete 14 system review, the patient complains of only the following symptoms, and all other reviewed systems are negative.   Social History   Social History  . Marital Status: Married    Spouse Name: N/A  . Number of Children: 2  . Years of Education: N/A   Occupational History  . retired Education administrator   Social History Main Topics  . Smoking status: Never Smoker   . Smokeless tobacco: Never Used  . Alcohol Use: No  . Drug Use: No  . Sexual Activity: Not on file   Other Topics Concern  . Not on file   Social History Narrative   HSG.  Married '65.  2 daughters, '71, '77; 4 grandchildren             Family History  Problem Relation Age of Onset  . Heart failure Mother   . Coronary artery disease Mother   . Dementia Mother   . Diabetes Mother   . Colon cancer Neg Hx   . Breast cancer  Neg Hx   . Hypertension Neg Hx   . Heart disease Neg Hx   . Heart attack Neg Hx   . Stroke Neg Hx     Past Medical History  Diagnosis Date  . Insomnia   . Adenomatous polyp of colon   . Overweight(278.02)   . Symptomatic menopausal or female climacteric states   . Hyperlipidemia   . Osteoarthrosis, unspecified whether generalized or localized, unspecified site   . Anxiety state, unspecified   . Hypertension   . Syncope and collapse     11/12: Holter 12/12 with rare PACS, HR range 64-140, average 82, no significant arrhythmias. Echo (1/13): EF 50-55%, mild LVH, septal-lateral dyssynchrony c/w LBBB. 3-week event monitor (1/13): No significant arrhythmia.   Marland Kitchen LBBB (left bundle branch block)     LHC in 2002 showed  normal coronaries.     Past Surgical History  Procedure Laterality Date  . Tubal ligation      Current Outpatient Prescriptions  Medication Sig Dispense Refill  . acetaminophen (TYLENOL) 325 MG tablet Take 2 tablets (650 mg total) by mouth 2 (two) times daily with breakfast and lunch. To help manage headaches during the day.    . ALPRAZolam (XANAX) 0.25 MG tablet Take 1-2 tablets (0.25-0.5 mg total) by mouth at bedtime as needed for sleep. 60 tablet 1  . calcium carbonate (OS-CAL) 600 MG TABS tablet Take 1,200 mg by mouth daily with breakfast.    . cholecalciferol (VITAMIN D) 1000 UNITS tablet Take 2,000 Units by mouth daily.     . metoprolol tartrate (LOPRESSOR) 25 MG tablet Take 1 tablet (25 mg total) by mouth 2 (two) times daily. 180 tablet 3  . ondansetron (ZOFRAN) 4 MG tablet Take 1 tablet (4 mg total) by mouth every 8 (eight) hours as needed for nausea or vomiting. 40 tablet 0  . PARoxetine (PAXIL) 10 MG tablet Take 10 mg by mouth daily.    Marland Kitchen pyridostigmine (MESTINON) 60 MG tablet 1 and 1/2 tablets (90mg ) three times a day 270 tablet 0  . QUEtiapine (SEROQUEL) 25 MG tablet Take 1 tablet (25 mg total) by mouth at bedtime as needed. 30 tablet 1  . topiramate (TOPAMAX) 25 MG tablet Take 1 tablet (25 mg total) by mouth at bedtime. 30 tablet 2  . [DISCONTINUED] Estradiol (ESTRACE PO) Take by mouth daily.      . [DISCONTINUED] medroxyPROGESTERone (PROVERA) 2.5 MG tablet Take 2.5 mg by mouth daily.       No current facility-administered medications for this visit.    Allergies as of 04/23/2015  . (No Known Allergies)    Vitals: BP 124/82 mmHg  Pulse 66  Resp 20  Ht 5\' 5"  (1.651 m)  Wt 143 lb (64.864 kg)  BMI 23.80 kg/m2 Last Weight:  Wt Readings from Last 1 Encounters:  04/23/15 143 lb (64.864 kg)   [id number] mass index is 23.8 kg/(m^2).     Last Height:   Ht Readings from Last 1 Encounters:  04/23/15 5\' 5"  (1.651 m)    Physical exam:  General: The patient is awake, alert  and appears not in acute distress. The patient is well groomed. Head: Normocephalic, atraumatic.  High grade Retrognathia is seen.  Cardiovascular:  Regular rate and rhythm with extra beats, without  murmurs or carotid bruit, and without distended neck veins. Respiratory: Lungs are clear to auscultation. Skin:  Without evidence of edema, or rash Trunk: BMI is normal  The patient's posture is erect  Neurologic exam :  The patient is awake and alert, oriented to place and time.   Memory subjective described as intact.  Attention span & concentration ability appears normal.  Speech is  Stuttering , non fluent,  without dysarthria, dysphonia .  Mood and affect are appropriate.  Cranial nerves: Pupils are equal and briskly reactive to light.  Funduscopic exam without evidence of pallor or edema. Extraocular movements  in vertical and horizontal planes intact and without nystagmus. Visual fields by finger perimetry are intact. Hearing to finger rub intact.   Facial sensation intact to fine touch.  Facial motor strength is symmetric and tongue and uvula move midline. Shoulder shrug was symmetrical.   Motor exam:   There is a mildly elevated tone over the left biceps noted and arm flexion and extension at the elbow show some resistance. Movement in the right upper extremity smokeless without cogwheeling the patient has equal muscle bulk and symmetric range of motion  Sensory:  Fine touch, pinprick and vibration, Proprioception normal.  Coordination: Rapid alternating movements in the fingers/hands was normal. Finger-to-nose maneuver  normal without evidence of ataxia, dysmetria or tremor.  Gait and station: Patient walks without assistive device and is able unassisted to climb up to the exam table. Strength within normal limits.  Stance is stable and normal. Turns with  3 Steps. Romberg testing is negative.  Deep tendon reflexes: in the  upper and lower extremities are symmetric and intact.  Babinski maneuver response is up-going on both sides. .  The patient was advised of the nature of the diagnosed  disorder , the treatment options and risks for general a health and wellness arising from not treating the condition.  I spent more than 40 minutes of face to face time with the patient. Greater than 50% of time was spent in counseling and coordination of care. We have discussed the diagnosis and differential and I answered the patient's questions.     Assessment:  After physical and neurologic examination, review of laboratory studies,  Personal review of imaging studies, reports of other /same  Imaging studies ,  Results of hospital evaluation, labs, and images, neurophysiology testing and pre-existing records as far as provided in visit., my assessment is   1) I'm glad to see that Mandy Durham has recovered greatly from the initial degree of disability following her subarachnoid hemorrhage. She no longer suffers from headaches, vertigo, dizziness, tinnitus and her bladder incontinence has been  Improved. She is concerned today that she still has foul-smelling stools that are soft not always well formed and is concerned about accidents. I suggested to include in her diet of bananas in the morning which will help to formed stools better and to consider mashed potatoes, pasta or bread to eat before a meal or with a meal. I would not recommend to use immodium as it could cause a "lazy " gut.  Her function has improved, overall.   I recommend a probiotic and,  if needed, a stool test for infections of the gut, colonization of the gut. Dr Larose Kells to send stool test.    Plan:  Treatment plan and additional workup :  Banana, potatoe diet for stool consolidation. Clostridium check , stool sample.             Asencion Partridge Lansing Sigmon MD  04/23/2015   CC: Colon Branch, [address], [place] [id number]

## 2015-05-01 ENCOUNTER — Telehealth: Payer: Self-pay | Admitting: Cardiology

## 2015-05-01 NOTE — Telephone Encounter (Signed)
Patient said that she had a syncopal episode yesterday for the first time in a month and one-half Is wearing her compression stockings and abdominal binder Is drinking 48 oz of fluid a day Is taking Mestinon 90 mg three times a day She did vacuum four rooms in her small house she said.  She will increase non- caffeinated fluids to 60 oz and divide up larger active task into smaller ones as not to over exert herself.. Routing to Dr. Aundra Dubin for further advice

## 2015-05-01 NOTE — Telephone Encounter (Signed)
Patient given instructions from Dr. Aundra Dubin

## 2015-05-01 NOTE — Telephone Encounter (Signed)
Would not change things for now, do not over-exert, let's see how she does over the next few weeks.

## 2015-05-01 NOTE — Telephone Encounter (Signed)
New Message:  Pt called in stating that she had a syncopal episode yesterday afternoon and this has been an ongoing issue and she would like to know what to do. Please f/u with her

## 2015-05-07 ENCOUNTER — Other Ambulatory Visit: Payer: Self-pay | Admitting: Internal Medicine

## 2015-05-08 NOTE — Telephone Encounter (Signed)
Rx printed, awaiting MD signature.  

## 2015-05-08 NOTE — Telephone Encounter (Signed)
Rx faxed to [facility]  

## 2015-05-08 NOTE — Telephone Encounter (Signed)
Ok 60 and 2 RF

## 2015-05-08 NOTE — Telephone Encounter (Signed)
Pt is requesting refill on Alprazolam.  Last OV: 03/22/2015 Last Fill: 03/06/2015 #60 and 1RF UDS: None  Needs UDS and Contract at next OV.  Please advise.

## 2015-05-28 ENCOUNTER — Ambulatory Visit: Payer: Medicare HMO | Admitting: Cardiology

## 2015-06-04 ENCOUNTER — Encounter: Payer: Self-pay | Admitting: Cardiology

## 2015-06-05 ENCOUNTER — Other Ambulatory Visit: Payer: Self-pay | Admitting: Cardiology

## 2015-06-06 ENCOUNTER — Other Ambulatory Visit: Payer: Self-pay | Admitting: *Deleted

## 2015-06-06 DIAGNOSIS — I951 Orthostatic hypotension: Secondary | ICD-10-CM

## 2015-06-06 DIAGNOSIS — I447 Left bundle-branch block, unspecified: Secondary | ICD-10-CM

## 2015-06-06 MED ORDER — PYRIDOSTIGMINE BROMIDE 60 MG PO TABS: ORAL_TABLET | ORAL | Status: DC

## 2015-06-14 ENCOUNTER — Ambulatory Visit: Payer: Medicare HMO | Admitting: Cardiology

## 2015-06-20 ENCOUNTER — Other Ambulatory Visit: Payer: Self-pay | Admitting: Internal Medicine

## 2015-06-24 ENCOUNTER — Encounter: Payer: Medicare HMO | Attending: Physical Medicine & Rehabilitation | Admitting: Physical Medicine & Rehabilitation

## 2015-06-24 ENCOUNTER — Encounter: Payer: Self-pay | Admitting: Physical Medicine & Rehabilitation

## 2015-06-24 VITALS — BP 151/54 | HR 54

## 2015-06-24 DIAGNOSIS — R42 Dizziness and giddiness: Secondary | ICD-10-CM | POA: Insufficient documentation

## 2015-06-24 DIAGNOSIS — E785 Hyperlipidemia, unspecified: Secondary | ICD-10-CM | POA: Insufficient documentation

## 2015-06-24 DIAGNOSIS — G4700 Insomnia, unspecified: Secondary | ICD-10-CM | POA: Insufficient documentation

## 2015-06-24 DIAGNOSIS — S02119A Unspecified fracture of occiput, initial encounter for closed fracture: Secondary | ICD-10-CM | POA: Diagnosis present

## 2015-06-24 DIAGNOSIS — G44319 Acute post-traumatic headache, not intractable: Secondary | ICD-10-CM | POA: Diagnosis not present

## 2015-06-24 DIAGNOSIS — S0083XA Contusion of other part of head, initial encounter: Secondary | ICD-10-CM | POA: Diagnosis not present

## 2015-06-24 DIAGNOSIS — I447 Left bundle-branch block, unspecified: Secondary | ICD-10-CM

## 2015-06-24 DIAGNOSIS — S069X2S Unspecified intracranial injury with loss of consciousness of 31 minutes to 59 minutes, sequela: Secondary | ICD-10-CM

## 2015-06-24 DIAGNOSIS — R4189 Other symptoms and signs involving cognitive functions and awareness: Secondary | ICD-10-CM | POA: Insufficient documentation

## 2015-06-24 DIAGNOSIS — I951 Orthostatic hypotension: Secondary | ICD-10-CM

## 2015-06-24 DIAGNOSIS — I10 Essential (primary) hypertension: Secondary | ICD-10-CM | POA: Diagnosis not present

## 2015-06-24 DIAGNOSIS — M1990 Unspecified osteoarthritis, unspecified site: Secondary | ICD-10-CM | POA: Insufficient documentation

## 2015-06-24 DIAGNOSIS — R269 Unspecified abnormalities of gait and mobility: Secondary | ICD-10-CM | POA: Diagnosis not present

## 2015-06-24 DIAGNOSIS — D126 Benign neoplasm of colon, unspecified: Secondary | ICD-10-CM | POA: Diagnosis not present

## 2015-06-24 DIAGNOSIS — N951 Menopausal and female climacteric states: Secondary | ICD-10-CM | POA: Diagnosis not present

## 2015-06-24 DIAGNOSIS — E663 Overweight: Secondary | ICD-10-CM | POA: Insufficient documentation

## 2015-06-24 NOTE — Progress Notes (Signed)
Subjective:    Patient ID: Mandy Durham, female    DOB: 11-Nov-1945, 70 y.o.   MRN: [id number]  HPI   Mandy Durham is here in follow up of her traumatic right frontal hemorrhagic contusion.  She has been much more active at home and outside the home. She has been getting outside more. Her lightheadedness seems improved as well. Sometimes when she has symptoms, she will sit down or lay off what she's doing before symptoms worsen. Symptoms only come on when she's fatigued.   Her headaches are less frequent. She uses an extra strength tylenol to knock out the pain. She reports sometimes that her head feels "full" ---it is usually when she's fatigued.   She has done some driving with her husband. She does occasionally have momments where she is anxious, but he states she was still safe behind the wheel despite that.   She continues to wear her abdominal binder and TEDS and wonders how long she'll need to do so. She also asked if she could starte "working out."      Pain Inventory Average Pain 0 Pain Right Now 0 My pain is no pain   In the last 24 hours, has pain interfered with the following? General activity 0 Relation with others 0 Enjoyment of life 0 What TIME of day is your pain at its worst? no pain  Sleep (in general) NA  Pain is worse with: no pain  Pain improves with: no pain Relief from Meds: no pain  Mobility walk without assistance how many minutes can you walk? 10 ability to climb steps?  yes do you drive?  yes  Function retired I need assistance with the following:  shopping  Neuro/Psych No problems in this area  Prior Studies Any changes since last visit?  no  Physicians involved in your care Any changes since last visit?  no   Family History  Problem Relation Age of Onset  . Heart failure Mother   . Coronary artery disease Mother   . Dementia Mother   . Diabetes Mother   . Colon cancer Neg Hx   . Breast cancer Neg Hx   . Hypertension Neg Hx     . Heart disease Neg Hx   . Heart attack Neg Hx   . Stroke Neg Hx    Social History   Social History  . Marital Status: Married    Spouse Name: N/A  . Number of Children: 2  . Years of Education: N/A   Occupational History  . retired Education administrator   Social History Main Topics  . Smoking status: Never Smoker   . Smokeless tobacco: Never Used  . Alcohol Use: No  . Drug Use: No  . Sexual Activity: Not Asked   Other Topics Concern  . None   Social History Narrative   HSG.  Married '65.  2 daughters, '71, '77; 4 grandchildren            Past Surgical History  Procedure Laterality Date  . Tubal ligation     Past Medical History  Diagnosis Date  . Insomnia   . Adenomatous polyp of colon   . Overweight(278.02)   . Symptomatic menopausal or female climacteric states   . Hyperlipidemia   . Osteoarthrosis, unspecified whether generalized or localized, unspecified site   . Anxiety state, unspecified   . Hypertension   . Syncope and collapse     11/12: Holter 12/12 with rare PACS, HR  range 64-140, average 82, no significant arrhythmias. Echo (1/13): EF 50-55%, mild LVH, septal-lateral dyssynchrony c/w LBBB. 3-week event monitor (1/13): No significant arrhythmia.   Marland Kitchen LBBB (left bundle branch block)     LHC in 2002 showed normal coronaries.    BP 151/54 mmHg  Pulse 54  SpO2 97%  Opioid Risk Score:   Fall Risk Score:  `1  Depression screen PHQ 2/9  Depression screen [facility] 2/9 06/24/2015 04/23/2015 03/26/2015 03/22/2015 06/19/2014 05/29/2014  Decreased Interest 0 0 0 0 0 0  Down, Depressed, Hopeless 0 0 0 0 0 0  PHQ - 2 Score 0 0 0 0 0 0  Altered sleeping - - 0 - - -  Tired, decreased energy - - 0 - - -  Change in appetite - - 0 - - -  Feeling bad or failure about yourself  - - 0 - - -  Trouble concentrating - - 0 - - -  Moving slowly or fidgety/restless - - 0 - - -  Suicidal thoughts - - 0 - - -  PHQ-9 Score - - 0 - - -    Review of  Systems     Objective:   Physical Exam  Constitutional: She appears well-developed and well-nourished. No distress.  HENT:  Head: Normocephalic.  Eyes: Conjunctivae and EOM are normal.  Neck: Normal range of motion.  Cardiovascular: Normal rate and regular rhythm. +Murmur  Respiratory: Effort normal and breath sounds normal. No respiratory distress. She has no wheezes.  GI: Soft. Bowel sounds are normal. She exhibits no distension. There is no tenderness.  Musculoskeletal: She exhibits no edema or tenderness.  Neuro: speech still stuttering at times--able to convey thoughts. A&O x 3. No nystagmus.  Strength b/l UE 4+/5 proximal to distal  B/l LE: 4+/5 proximally to distally  She is alert and oriented to person and place  She is able to follow one and two step commands without difficulty. she ambulates without difficulty.  Changed directions without problems. No sway during standing or gait.  Tandem gait remains difficult  Skin: Skin is warm and dry. No rash noted. She is not diaphoretic. No erythema.  Psychiatric: Her behavior is normal. Thought content normal.     Assessment & Plan:   1. Gait, balance, cognitive deficits secondary to traumatic right frontal hemorrhagic contusion, occipital skull fracture and SAH  -continue HEP -continue with light activities at home and in community if tolerated. -I allowed her to do some light exercise, such as walking or use of stationary bike -recommend follow up with cardiology before pursuing more vigorous activities. 2. Allowed her to drive on a local basis with 20 minute radius during the day. She is to avoid driving while fatigued.   3. Pain Management: nearly resolved.  -off topamax  4. LBBB/Supine HTN/light headedness: On metoprolol bid for supine hypertension  -Mestonin to help with orthostatic changes .  -teds/abdominal binder, encourage fluids, extra time to sit EOB or EOC before standing.  -encouraged the use of a BP cuff/HR  monitor at home to check orthostatics. -avoid overexertion if poor sleep or not feeling well.  -gradually showing improvement -has follow up with cardiology later this month    Thirty minutes of face to face patient care time were spent during this visit. All questions were encouraged and answered.  I'll see her back in 4 months.

## 2015-06-24 NOTE — Patient Instructions (Addendum)
DISCUSS YOUR BLOOD PRESSURE AND FURTHER MONITORING AND MANAGEMENT WITH YOUR CARDIOLOGIST.   CONTINUE TO PACE YOURSELF. KNOW YOUR LIMITS. SIT DOWN WHEN YOU'RE TIRED OR FEEL FATIGUED.   CLARITIN FOR ALLERGIES,    RETURN TO DRIVING PLAN:  YOU MAY PROCEED TO DRIVING ALONE, DURING THE DAY TIME, IN NON-PEAK TRAFFIC TIMES. YOU SHOULD DRIVE NO FURTHER THAN 20 MINUTES IN ONE DIRECTION. PLEASE DO NOT DRIVE IF YOU FEEL FATIGUED OR UNDER THE INFLUENCE OF MEDICATION. DON'T DRIVE IF YOU DON'T FEEL WELL OR IF YOU FEEL FATIGUED.

## 2015-07-03 ENCOUNTER — Encounter: Payer: Self-pay | Admitting: Cardiology

## 2015-07-03 ENCOUNTER — Ambulatory Visit (INDEPENDENT_AMBULATORY_CARE_PROVIDER_SITE_OTHER): Payer: Medicare HMO | Admitting: Cardiology

## 2015-07-03 VITALS — BP 124/74 | HR 64 | Ht 65.0 in | Wt 142.0 lb

## 2015-07-03 DIAGNOSIS — I951 Orthostatic hypotension: Secondary | ICD-10-CM

## 2015-07-03 DIAGNOSIS — R42 Dizziness and giddiness: Secondary | ICD-10-CM | POA: Diagnosis not present

## 2015-07-03 MED ORDER — MIDODRINE HCL 2.5 MG PO TABS: 2.5000 mg | ORAL_TABLET | Freq: Three times a day (TID) | ORAL | Status: DC

## 2015-07-03 NOTE — Patient Instructions (Addendum)
Your physician has recommended you make the following change in your medication:  1.) midodrine 2.5 mg three times a day with meals Check your blood pressure daily, if consistently running below 140/90 and still getting lightheaded, increase midodrine to 5 mg three times a day)  Your physician has requested that you have an echocardiogram. Echocardiography is a painless test that uses sound waves to create images of your heart. It provides your doctor with information about the size and shape of your heart and how well your heart's chambers and valves are working. This procedure takes approximately one hour. There are no restrictions for this procedure.   Your physician recommends that you schedule a follow-up appointment in: 3-4 weeks with physician extender.  Your physician wants you to follow-up in: 4 months with Dr. Aundra Dubin.  You will receive a reminder letter in the mail two months in advance. If you don't receive a letter, please call our office to schedule the follow-up appointment.

## 2015-07-04 NOTE — Progress Notes (Signed)
Patient ID: Mandy Durham, female   DOB: March 13, 1946, 70 y.o.   MRN: [id number] PCP: Dr. Linna Darner  70 yo with history of chronic LBBB and syncope returns for cardiology followup. LBBB has been noted since at least 2000.  She had a cath in 2002 because of chest pain that showed no angiographic CAD.  I had her do an echocardiogram in 1/13 showed EF 50-55% with septal-lateral dyssynchrony consistent with LBBB.   For several years, she has had episodes of syncope and presyncope.  She tends to get lightheaded with prolonged standing or walking at times.  Occasionally, she would get severely lightheaded and pass out or nearly pass out.  All episodes seem to have been while standing and have a prodrome of lightheadedness, nausea, and warmth.  No chest pain or tachypalpitations.  She does notice that when she drinks a lot of fluid, she does not feel as lightheaded.  No exertional dyspnea.  At a prior appointment, I started her on Florinef.  She does not think this helped much.  Her supine BP runs high and she had further episodes on Florinef.  Therefore, I had her start pyridostigmine and increased to tid.  This helped considerably initially.  However, in 11/16, she was standing at her sink and felt faint.  She passed out and hit her head => small subarachnoid hemorrhage.  She did not have to have surgery.  I then had her wear an abdominal binder in addition to compressions stockings. She thinks that this has helped some (less lightheaded spells).  At last appointment, I increased pyridostigmine to 90 mg tid.  She has continued to have spells of lightheadedness when standing.  She passed out on day in March when she bent down to pick something up then stood quickly. BP is not elevated today.  Labs (4/12): LDL 80, HDL 123, TSH normal Labs (11/12): K 4, creatinine 0.8 Labs (8/14): LDL 107, HDL 102 Labs (11/14): K 3.4, creatinine 0.7 Labs (5/15): K 3.8, creatinine 0.8, TSH normal Labs (3/16); K 4.1, creatinine  0.75 Labs (6/16): LDL 140, HDL 91 Labs (11/16): K 4, creatinine 0.88  PMH: 1. Depression 2. Hyperlipidemia 3. Osteoarthritis 4. LBBB since around 2000.  LHC in 2002 showed normal coronaries.   5. Syncope: Holter 12/12 with rare PACS, HR range 64-140, average 82, no significant arrhythmias.  Echo (1/13): EF 50-55%, mild LVH, septal-lateral dyssynchrony c/w LBBB.  3-week event monitor (1/13): No significant arrhythmia.  EEG (11/14) normal.  Suspect autonomic insufficiency.  Fall in 11/16 with SAH (due to syncope). 6. H/o labyrinthitis  SH: Lives in [place], married, nonsmoker, works as an Biochemist, clinical.   FH: No CAD that she knows of.  No CHF.   ROS: All systems reviewed and negative except as per HPI.    Current Outpatient Prescriptions  Medication Sig Dispense Refill  . acetaminophen (TYLENOL) 500 MG tablet Take 500 mg by mouth every 6 (six) hours as needed.    . ALPRAZolam (XANAX) 0.25 MG tablet Take 1-2 tablets (0.25-0.5 mg total) by mouth at bedtime as needed for sleep. 60 tablet 2  . fluticasone (FLONASE) 50 MCG/ACT nasal spray USE TWO SPRAY(S) IN EACH NOSTRIL ONCE DAILY 16 g 3  . metoprolol tartrate (LOPRESSOR) 25 MG tablet Take 1 tablet (25 mg total) by mouth 2 (two) times daily. 180 tablet 3  . PARoxetine (PAXIL) 10 MG tablet Take 10 mg by mouth daily.    Marland Kitchen pyridostigmine (MESTINON) 60 MG tablet Take 1 and 1/2  tablets (90mg ) three times a day by mouth 270 tablet 1  . QUEtiapine (SEROQUEL) 25 MG tablet Take 12.5 mg by mouth at bedtime.    . topiramate (TOPAMAX) 25 MG tablet Take 12.5 mg by mouth daily.    . midodrine (PROAMATINE) 2.5 MG tablet Take 1 tablet (2.5 mg total) by mouth 3 (three) times daily with meals. 90 tablet 11  . [DISCONTINUED] Estradiol (ESTRACE PO) Take by mouth daily.      . [DISCONTINUED] medroxyPROGESTERone (PROVERA) 2.5 MG tablet Take 2.5 mg by mouth daily.       No current facility-administered medications for this visit.    BP 124/74 mmHg   Pulse 64  Ht 5\' 5"  (1.651 m)  Wt 142 lb (64.411 kg)  BMI 23.63 kg/m2 General: NAD Neck: No JVD, no thyromegaly or thyroid nodule.  Lungs: Clear to auscultation bilaterally with normal respiratory effort. CV: Nondisplaced PMI.  Heart regular S1/S2, no S3/S4, no murmur.  No peripheral edema.  No carotid bruit.  Normal pedal pulses.  Abdomen: Soft, nontender, no hepatosplenomegaly, no distention.  Neurologic: Alert and oriented x 3.  Psych: Normal affect. Extremities: No clubbing or cyanosis.   Assessment/Plan: 1. Lightheadedness/syncope: Always when standing, brief in duration.  More likely to have presyncope/syncope if she squats down to pick something up then stands up. I suspect that these spells represent autonomic insufficiency/orthostatic hypotension.  Past event monitoring has shown no arrhythmia. Pyridostigmine had worked well, but she is now having breakthrough symptoms despite 90 mg tid. She is wearing an abdominal binder and compression stockings.  - Continue pyridostigmine 90 mg tid.  - Continue to use abdominal binder and compression stockings.  - I will carefully add midodrine.  Will need to follow BP closely.  Will start it at 2.5 mg tid.  She will check BP daily.  If she still has lightheaded spells and BP < 140/90, she can increase it to 5 mg tid. I will have her followup with a PA in this office in 3-4 weeks for BP check and to increase midodrine again if needed. - She needs to get regular exercise but avoid dehydration.  . 2. LBBB: Chronic.  No ischemic symptoms.  3. Supine HTN with orthostatic hypotension: She has been on metoprolol 25 mg bid.  Will need to follow BP closely with midodrine, as above.   Loralie Champagne 07/04/2015

## 2015-07-17 ENCOUNTER — Ambulatory Visit (HOSPITAL_COMMUNITY): Payer: Medicare HMO | Attending: Internal Medicine

## 2015-07-17 ENCOUNTER — Other Ambulatory Visit: Payer: Self-pay

## 2015-07-17 DIAGNOSIS — I447 Left bundle-branch block, unspecified: Secondary | ICD-10-CM | POA: Insufficient documentation

## 2015-07-17 DIAGNOSIS — I340 Nonrheumatic mitral (valve) insufficiency: Secondary | ICD-10-CM | POA: Diagnosis not present

## 2015-07-17 DIAGNOSIS — I071 Rheumatic tricuspid insufficiency: Secondary | ICD-10-CM | POA: Insufficient documentation

## 2015-07-17 DIAGNOSIS — R42 Dizziness and giddiness: Secondary | ICD-10-CM | POA: Insufficient documentation

## 2015-07-17 DIAGNOSIS — E785 Hyperlipidemia, unspecified: Secondary | ICD-10-CM | POA: Diagnosis not present

## 2015-07-17 DIAGNOSIS — I119 Hypertensive heart disease without heart failure: Secondary | ICD-10-CM | POA: Insufficient documentation

## 2015-07-22 ENCOUNTER — Ambulatory Visit (INDEPENDENT_AMBULATORY_CARE_PROVIDER_SITE_OTHER): Payer: Medicare HMO | Admitting: Internal Medicine

## 2015-07-22 ENCOUNTER — Encounter: Payer: Self-pay | Admitting: Internal Medicine

## 2015-07-22 VITALS — BP 126/80 | HR 56 | Temp 97.8°F | Ht 65.0 in | Wt 139.0 lb

## 2015-07-22 DIAGNOSIS — Z1159 Encounter for screening for other viral diseases: Secondary | ICD-10-CM

## 2015-07-22 DIAGNOSIS — Z09 Encounter for follow-up examination after completed treatment for conditions other than malignant neoplasm: Secondary | ICD-10-CM

## 2015-07-22 DIAGNOSIS — G44319 Acute post-traumatic headache, not intractable: Secondary | ICD-10-CM

## 2015-07-22 DIAGNOSIS — S069X2S Unspecified intracranial injury with loss of consciousness of 31 minutes to 59 minutes, sequela: Secondary | ICD-10-CM | POA: Diagnosis not present

## 2015-07-22 DIAGNOSIS — G4700 Insomnia, unspecified: Secondary | ICD-10-CM | POA: Diagnosis not present

## 2015-07-22 MED ORDER — TOPIRAMATE 25 MG PO TABS: 25.0000 mg | ORAL_TABLET | Freq: Every day | ORAL | Status: DC

## 2015-07-22 MED ORDER — AZELASTINE HCL 0.1 % NA SOLN: 2.0000 | Freq: Two times a day (BID) | NASAL | Status: DC

## 2015-07-22 NOTE — Progress Notes (Signed)
Pre visit review using our clinic review tool, if applicable. No additional management support is needed unless otherwise documented below in the visit note. 

## 2015-07-22 NOTE — Patient Instructions (Signed)
GO TO THE LAB : Get the blood work  , also provide a urine sample   GO TO THE FRONT DESK Schedule your next appointment for a  checkup in 2-3 weeks   Stop Seroquel  Increase Topamax to 1 tablet daily  Astelin 2 sprays on each side of the nose twice a day

## 2015-07-22 NOTE — Progress Notes (Signed)
Subjective:    Patient ID: Mandy Durham, female    DOB: 08-24-1945, 70 y.o.   MRN: [id number]  DOS:  07/22/2015 Type of visit - description : Follow-up Interval history:  Since the last office visit, was seen by cardiology, note reviewed, they added midodrine, good tolerance so far. Saw neurology, note reviewed, she was referred there due to issues with bladder and bowel incontinence, issues resolving Saw rehabilitation medicine, note reviewed, was felt to be stable Insomnia: on Xanax, needs a contract.  Additionally she has the following concerns: Request a hepatitis C screening Allergic rhinitis, described as constant runny nose, on further questioning, symptoms started after she was admitted to the hospital with [facility]. Flonase and claritin not helping. Nasal discharge is not described as salty. Also having headache, daily for 2 weeks, and  still has occasional dizziness. The headache usually is better after 2 Tylenols, located at the front. Despite above concerns  states she is doing much better   Review of Systems Denies fever chills No chest pain or difficulty breathing No nausea, vomiting, diarrhea Improved bladder or bowel control  Past Medical History  Diagnosis Date  . Insomnia   . Adenomatous polyp of colon   . Overweight(278.02)   . Symptomatic menopausal or female climacteric states   . Hyperlipidemia   . Osteoarthrosis, unspecified whether generalized or localized, unspecified site   . Anxiety state, unspecified   . Hypertension   . Syncope and collapse     11/12: Holter 12/12 with rare PACS, HR range 64-140, average 82, no significant arrhythmias. Echo (1/13): EF 50-55%, mild LVH, septal-lateral dyssynchrony c/w LBBB. 3-week event monitor (1/13): No significant arrhythmia.   Marland Kitchen LBBB (left bundle branch block)     LHC in 2002 showed normal coronaries.     Past Surgical History  Procedure Laterality Date  . Tubal ligation      Social History   Social History   . Marital Status: Married    Spouse Name: N/A  . Number of Children: 2  . Years of Education: N/A   Occupational History  . retired Education administrator   Social History Main Topics  . Smoking status: Never Smoker   . Smokeless tobacco: Never Used  . Alcohol Use: No  . Drug Use: No  . Sexual Activity: Not on file   Other Topics Concern  . Not on file   Social History Narrative   HSG.  Married '65.  2 daughters, '71, '77; 4 grandchildren                 Medication List       This list is accurate as of: 07/22/15 11:59 PM.  Always use your most recent med list.               acetaminophen 500 MG tablet  Commonly known as:  TYLENOL  Take 500 mg by mouth every 6 (six) hours as needed.     ALPRAZolam 0.25 MG tablet  Commonly known as:  XANAX  Take 1-2 tablets (0.25-0.5 mg total) by mouth at bedtime as needed for sleep.     azelastine 0.1 % nasal spray  Commonly known as:  ASTELIN  Place 2 sprays into both nostrils 2 (two) times daily.     metoprolol tartrate 25 MG tablet  Commonly known as:  LOPRESSOR  Take 1 tablet (25 mg total) by mouth 2 (two) times daily.     midodrine 2.5 MG tablet  Commonly known as:  PROAMATINE  Take 1 tablet (2.5 mg total) by mouth 3 (three) times daily with meals.     PARoxetine 10 MG tablet  Commonly known as:  PAXIL  Take 10 mg by mouth daily.     pyridostigmine 60 MG tablet  Commonly known as:  MESTINON  Take 1 and 1/2 tablets (90mg ) three times a day by mouth     topiramate 25 MG tablet  Commonly known as:  TOPAMAX  Take 1 tablet (25 mg total) by mouth daily.           Objective:   Physical Exam BP 126/80 mmHg  Pulse 56  Temp(Src) 97.8 F (36.6 C) (Oral)  Ht 5\' 5"  (1.651 m)  Wt 139 lb (63.05 kg)  BMI 23.13 kg/m2  SpO2 96%  General:   Well developed, well nourished . NAD.   HEENT:  Normocephalic . Face symmetric, atraumatic. Nose slt congested. Sinuses no TTP Lungs:  CTA  B Normal respiratory effort, no intercostal retractions, no accessory muscle use. Heart: RRR,  no murmur.  No pretibial edema bilaterally  Skin: Exposed areas without rash. Not pale. Not jaundice Neurologic:  alert & oriented X3.  Compared to the last time she was here, difficulty with word finding is essentially resolved. She seems better. Speech normal, gait appropriate for age and unassisted Strength symmetric and appropriate for age.  Psych: Cognition and judgment appear intact.  Cooperative with normal attention span and concentration.  Behavior appropriate. No anxious or depressed appearing.    Assessment & Plan:   Assessment> Prediabetes  HTN Hyperlipidemia Anxiety Menopausal Recurrent Syncope-- extensive cards eval before, DX with autonomic insufficiency ~ 02-2015 LBBB Traumatic SAH (after a syncope),TBI--- admitted 01-2015 , d/c to a rehab unit temporarily -- was rx seroquel (apatrently for ICU delirium) --rx topamax when at rehab for post [facility]  HAs -- had issues w/b/b incontinence, better as of 07-2015  PLAN Autonomic insufficiency: Managed by cardiology  Traumatic SAH, TBI: --B/b incontinence: Much improved, sawneurology, no further recommendations provided. --Headache: Today c/o a steady, frontal headache for the last 2 weeks, headaches have been an issue since her SAH/TBI but headaches were better for a while and now have resurface. Recommend to increase Topamax to 1 tablet daily, reassess in 2 weeks --History of ICU delirium, on Seroquel since the admission for [facility]. Currently 12.5 mg qd---> d/c seroquel Insomnia: Continue Xanax, contract signed, UDS  Allergies?. Chronic nasal congestion since the [facility] but worse for the last 2 weeks, failed it improve w/ Flonase - Claritin. Trial with Astelin. If not better, ENT referral, CSF leak?. Hep C screening per patient request: We'll do RTC 2 weeks   Today, I spent more than  35  min with the patient: >50% of the time  counseling regards each one of her concerns, we went over multiple questions regards her headaches, possible allergies, I review multiple l documents in the chart.

## 2015-07-23 LAB — HEPATITIS C ANTIBODY: HCV Ab: NEGATIVE

## 2015-07-23 NOTE — Assessment & Plan Note (Signed)
Autonomic insufficiency: Managed by cardiology  Traumatic SAH, TBI: --B/b incontinence: Much improved, sawneurology, no further recommendations provided. --Headache: Today c/o a steady, frontal headache for the last 2 weeks, headaches have been an issue since her SAH/TBI but headaches were better for a while and now have resurface. Recommend to increase Topamax to 1 tablet daily, reassess in 2 weeks --History of ICU delirium, on Seroquel since the admission for [facility]. Currently 12.5 mg qd---> d/c seroquel Insomnia: Continue Xanax, contract signed, UDS  Allergies?. Chronic nasal congestion since the [facility] but worse for the last 2 weeks, failed it improve w/ Flonase - Claritin. Trial with Astelin. If not better, ENT referral, CSF leak?. Hep C screening per patient request: We'll do RTC 2 weeks

## 2015-07-29 ENCOUNTER — Encounter: Payer: Self-pay | Admitting: Physician Assistant

## 2015-07-29 ENCOUNTER — Ambulatory Visit (INDEPENDENT_AMBULATORY_CARE_PROVIDER_SITE_OTHER): Payer: Medicare HMO | Admitting: Physician Assistant

## 2015-07-29 VITALS — BP 158/70 | HR 61 | Ht 65.0 in | Wt 139.0 lb

## 2015-07-29 DIAGNOSIS — R55 Syncope and collapse: Secondary | ICD-10-CM | POA: Diagnosis not present

## 2015-07-29 DIAGNOSIS — I447 Left bundle-branch block, unspecified: Secondary | ICD-10-CM | POA: Diagnosis not present

## 2015-07-29 DIAGNOSIS — I10 Essential (primary) hypertension: Secondary | ICD-10-CM | POA: Diagnosis not present

## 2015-07-29 NOTE — Patient Instructions (Signed)
Medication Instructions:  Your physician recommends that you continue on your current medications as directed. Please refer to the Current Medication list given to you today.   Labwork: None ordered  Testing/Procedures: None ordered  Follow-Up: Your physician recommends that you schedule a follow-up appointment in: [place]    Any Other Special Instructions Will Be Listed Below (If Applicable).     If you need a refill on your cardiac medications before your next appointment, please call your pharmacy.

## 2015-07-29 NOTE — Progress Notes (Signed)
Cardiology Office Note    Date:  07/29/2015   ID:  Mandy Durham, Mandy Durham 02/26/1946, MRN [id number]  PCP:  Mandy November, MD  Cardiologist:  Dr. Aundra Dubin   Chief complaint dizziness  History of Present Illness:  Mandy Durham is a 70 y.o. female with history of chronic LBBB and syncope.Cardiac catheterization 2002 for chest pain showed no angiographic evidence of CAD. Echo in 2013 EF 50-55% with septal lateral dyssynchrony consistent with left bundle branch block.  For several years, she has had episodes of syncope and presyncope. She tends to get lightheaded with prolonged standing or walking at times. Occasionally, she would get severely lightheaded and pass out or nearly pass out. All episodes seem to have been while standing and have a prodrome of lightheadedness, nausea, and warmth. No chest pain or tachypalpitations. She does notice that when she drinks a lot of fluid, she does not feel as lightheaded. No exertional dyspnea. At a prior appointment, Dr. Aundra Dubin started her on Florinef. She does not think this helped much. Her supine BP runs high and she had further episodes on Florinef. Therefore, he had her start pyridostigmine and increased to tid. This helped considerably initially. However, in 11/16, she was standing at her sink and felt faint. She passed out and hit her head => small subarachnoid hemorrhage. She did not have to have surgery. He then had her wear an abdominal binder in addition to compressions stockings. She thinks that this has helped some (less lightheaded spells). In January 2017, he increased pyridostigmine to 90 mg tid.   She has continued to have spells of lightheadedness when standing. She passed out on day in March 2017 when she bent down to pick something up then stood quickly. When Dr. Aundra Dubin saw her 07/03/15 BP was not elevated. Dr. Aundra Dubin felt she was having breakthrough symptoms on the Pyridostigmine and added midodrine 2.5 mg bid. She is here to  follow-up supine hypertension with orthostatic hypotension.  She has had some dizziness but no falls. Head is less fogggy and she feels better.  BP's from home 150-167/80. 2-D echo showed normal systolic LV function and grade 1 DD.  Past Medical History  Diagnosis Date  . Insomnia   . Adenomatous polyp of colon   . Overweight(278.02)   . Symptomatic menopausal or female climacteric states   . Hyperlipidemia   . Osteoarthrosis, unspecified whether generalized or localized, unspecified site   . Anxiety state, unspecified   . Hypertension   . Syncope and collapse     11/12: Holter 12/12 with rare PACS, HR range 64-140, average 82, no significant arrhythmias. Echo (1/13): EF 50-55%, mild LVH, septal-lateral dyssynchrony c/w LBBB. 3-week event monitor (1/13): No significant arrhythmia.   Marland Kitchen LBBB (left bundle branch block)     LHC in 2002 showed normal coronaries.     Past Surgical History  Procedure Laterality Date  . Tubal ligation      Current Medications: Outpatient Prescriptions Prior to Visit  Medication Sig Dispense Refill  . acetaminophen (TYLENOL) 500 MG tablet Take 500 mg by mouth every 6 (six) hours as needed.    . ALPRAZolam (XANAX) 0.25 MG tablet Take 1-2 tablets (0.25-0.5 mg total) by mouth at bedtime as needed for sleep. 60 tablet 2  . azelastine (ASTELIN) 0.1 % nasal spray Place 2 sprays into both nostrils 2 (two) times daily. 30 mL 6  . metoprolol tartrate (LOPRESSOR) 25 MG tablet Take 1 tablet (25 mg total) by mouth 2 (two)  times daily. 180 tablet 3  . midodrine (PROAMATINE) 2.5 MG tablet Take 1 tablet (2.5 mg total) by mouth 3 (three) times daily with meals. 90 tablet 11  . PARoxetine (PAXIL) 10 MG tablet Take 10 mg by mouth daily.    Marland Kitchen pyridostigmine (MESTINON) 60 MG tablet Take 1 and 1/2 tablets (90mg ) three times a day by mouth 270 tablet 1  . topiramate (TOPAMAX) 25 MG tablet Take 1 tablet (25 mg total) by mouth daily. 30 tablet 6   No facility-administered  medications prior to visit.     Allergies:   Review of patient's allergies indicates no known allergies.   Social History   Social History  . Marital Status: Married    Spouse Name: N/A  . Number of Children: 2  . Years of Education: N/A   Occupational History  . retired Education administrator   Social History Main Topics  . Smoking status: Never Smoker   . Smokeless tobacco: Never Used  . Alcohol Use: No  . Drug Use: No  . Sexual Activity: Not Asked   Other Topics Concern  . None   Social History Narrative   HSG.  Married '65.  2 daughters, '71, '77; 4 grandchildren              Family History:  The patient's family history includes Coronary artery disease in her mother; Dementia in her mother; Diabetes in her mother; Heart failure in her mother. There is no history of Colon cancer, Breast cancer, Hypertension, Heart disease, Heart attack, or Stroke.   ROS:   Please see the history of present illness.    Review of Systems  Constitution: Negative.  HENT: Negative.   Eyes: Negative.   Cardiovascular: Negative.   Respiratory: Negative.   Hematologic/Lymphatic: Negative.   Musculoskeletal: Negative.  Negative for joint pain.  Gastrointestinal: Negative.   Genitourinary: Negative.   Neurological: Negative.    All other systems reviewed and are negative.   PHYSICAL EXAM:   VS:  BP 158/70 mmHg  Pulse 61  Ht 5\' 5"  (1.651 m)  Wt 139 lb (63.05 kg)  BMI 23.13 kg/m2   GEN: Well nourished, well developed, in no acute distress HEENT: normal Neck: no JVD, carotid bruits, or masses Cardiac: RRR; 2/6 systolic murmur at the left sternal border, no rubs, or gallops,no edema  Respiratory:  clear to auscultation bilaterally, normal work of breathing GI: soft, nontender, nondistended, + BS MS: no deformity or atrophy Skin: warm and dry, no rash Neuro:  Alert and Oriented x 3, Strength and sensation are intact Psych: euthymic mood, full  affect  Wt Readings from Last 3 Encounters:  07/29/15 139 lb (63.05 kg)  07/22/15 139 lb (63.05 kg)  07/03/15 142 lb (64.411 kg)      Studies/Labs Reviewed:   EKG:  EKG is Not ordered today.   Recent Labs: 09/11/2014: TSH 3.10 01/24/2015: Magnesium 1.8 01/29/2015: ALT 17; BUN 15; Creatinine, Ser 0.88; Hemoglobin 13.4; Platelets 386; Potassium 4.0; Sodium 137   Lipid Panel    Component Value Date/Time   CHOL 254* 09/11/2014 0805   TRIG 76.0 09/11/2014 0805   HDL 90.80 09/11/2014 0805   CHOLHDL 3 09/11/2014 0805   VLDL 15.2 09/11/2014 0805   LDLCALC 148* 09/11/2014 0805   LDLDIRECT 106.9 11/03/2012 0913    Additional studies/ records that were reviewed today include:   2-D echo 07/17/15 Study Conclusions   - Left ventricle: The cavity size was normal. There was severe  focal basal hypertrophy of the septum. Systolic function was   normal. The estimated ejection fraction was in the range of 50%   to 55%. Anteroseptal hypokinesis. Doppler parameters are   consistent with abnormal left ventricular relaxation (grade 1   diastolic dysfunction). The E/e&' ratio is between 8-15,   suggesting indeterminate LV filling pressure. - Mitral valve: Mildly thickened leaflets . There was trivial   regurgitation. - Left atrium: The atrium was normal in size. - Tricuspid valve: There was mild regurgitation. - Pulmonary arteries: PA peak pressure: 32 mm Hg (S). - Inferior vena cava: The vessel was normal in size. The   respirophasic diameter changes were in the normal range (>= 50%),   consistent with normal central venous pressure.   Impressions:   - Compared to a prior echo in 2016, there are no changes.      ASSESSMENT:    1. Syncope and collapse   2. Essential hypertension   3. LBBB (left bundle branch block)      PLAN:  In order of problems listed above: Syncope and collapse with history of subarachnoid hemorrhage. The patient is doing better since low-dose Midodrine  has been added. She has had some dizziness but no syncope or falls. Follow-up with Dr. Aundra Dubin in August as scheduled.  Hypertension the patient's blood pressures have gone up a little bit on the midodrine but they are still in the stable range. Continue to monitor closely. Continue to stay hydrated. Blood pressure has not stayed below 140/90 so will not increase midodrine.  Left bundle branch block chronic    Medication Adjustments/Labs and Tests Ordered: Current medicines are reviewed at length with the patient today.  Concerns regarding medicines are outlined above.  Medication changes, Labs and Tests ordered today are listed in the Patient Instructions below. Patient Instructions  Medication Instructions:  Your physician recommends that you continue on your current medications as directed. Please refer to the Current Medication list given to you today.   Labwork: None ordered  Testing/Procedures: None ordered  Follow-Up: Your physician recommends that you schedule a follow-up appointment in: [place]    Any Other Special Instructions Will Be Listed Below (If Applicable).     If you need a refill on your cardiac medications before your next appointment, please call your pharmacy.       Sumner Boast, PA-C  07/29/2015 1:19 PM    [facility] [place], [place], [place]  [address] Phone: [phone]; Fax: [phone]

## 2015-08-05 ENCOUNTER — Telehealth: Payer: Self-pay

## 2015-08-05 NOTE — Telephone Encounter (Signed)
UDS: 07/22/2015  Positive for Alprazolam   Low risk per Dr. Larose Kells 08/05/2015

## 2015-08-15 ENCOUNTER — Ambulatory Visit (INDEPENDENT_AMBULATORY_CARE_PROVIDER_SITE_OTHER): Payer: Medicare HMO | Admitting: Internal Medicine

## 2015-08-15 ENCOUNTER — Encounter: Payer: Self-pay | Admitting: Internal Medicine

## 2015-08-15 VITALS — BP 124/68 | HR 60 | Temp 97.7°F | Ht 65.0 in | Wt 138.4 lb

## 2015-08-15 DIAGNOSIS — G44319 Acute post-traumatic headache, not intractable: Secondary | ICD-10-CM

## 2015-08-15 DIAGNOSIS — I10 Essential (primary) hypertension: Secondary | ICD-10-CM | POA: Diagnosis not present

## 2015-08-15 NOTE — Patient Instructions (Signed)
  GO TO THE FRONT DESK Schedule your next appointment for a physical in 3-4 months, fasting     Check the  blood pressure 2 or 3 times a week Be sure your blood pressure is between 110/65 and  145/85. If it is consistently higher or lower, let me know

## 2015-08-15 NOTE — Progress Notes (Signed)
Pre visit review using our clinic review tool, if applicable. No additional management support is needed unless otherwise documented below in the visit note. 

## 2015-08-15 NOTE — Progress Notes (Signed)
Subjective:    Patient ID: Mandy Durham, female    DOB: January 04, 1946, 70 y.o.   MRN: [id number]  DOS:  08/15/2015 Type of visit - description :  Follow-up Interval history:  headaches: Topamax dose increased, headaches decreased , has required Tylenol only one time since last OV Runny nose, see las OV-- also  improved.  labs reviewed-- not due for labs Cards note reviewed, doing well , BP slt elevated probably related to midodrine, amb BPs 145-150/70   Review of Systems No N-V-D No anxiety-depression Denies dizziness   Past Medical History  Diagnosis Date  . Insomnia   . Adenomatous polyp of colon   . Overweight(278.02)   . Symptomatic menopausal or female climacteric states   . Hyperlipidemia   . Osteoarthrosis, unspecified whether generalized or localized, unspecified site   . Anxiety state, unspecified   . Hypertension   . Syncope and collapse     11/12: Holter 12/12 with rare PACS, HR range 64-140, average 82, no significant arrhythmias. Echo (1/13): EF 50-55%, mild LVH, septal-lateral dyssynchrony c/w LBBB. 3-week event monitor (1/13): No significant arrhythmia.   Marland Kitchen LBBB (left bundle branch block)     LHC in 2002 showed normal coronaries.     Past Surgical History  Procedure Laterality Date  . Tubal ligation      Social History   Social History  . Marital Status: Married    Spouse Name: N/A  . Number of Children: 2  . Years of Education: N/A   Occupational History  . retired Education administrator   Social History Main Topics  . Smoking status: Never Smoker   . Smokeless tobacco: Never Used  . Alcohol Use: No  . Drug Use: No  . Sexual Activity: Not on file   Other Topics Concern  . Not on file   Social History Narrative   HSG.  Married '65.  2 daughters, '71, '77; 4 grandchildren                 Medication List       This list is accurate as of: 08/15/15 11:59 PM.  Always use your most recent med list.              acetaminophen 500 MG tablet  Commonly known as:  TYLENOL  Take 500 mg by mouth every 6 (six) hours as needed.     ALPRAZolam 0.25 MG tablet  Commonly known as:  XANAX  Take 1-2 tablets (0.25-0.5 mg total) by mouth at bedtime as needed for sleep.     azelastine 0.1 % nasal spray  Commonly known as:  ASTELIN  Place 2 sprays into both nostrils 2 (two) times daily.     metoprolol tartrate 25 MG tablet  Commonly known as:  LOPRESSOR  Take 1 tablet (25 mg total) by mouth 2 (two) times daily.     midodrine 2.5 MG tablet  Commonly known as:  PROAMATINE  Take 1 tablet (2.5 mg total) by mouth 3 (three) times daily with meals.     PARoxetine 10 MG tablet  Commonly known as:  PAXIL  Take 10 mg by mouth daily.     pyridostigmine 60 MG tablet  Commonly known as:  MESTINON  Take 1 and 1/2 tablets (90mg ) three times a day by mouth     topiramate 25 MG tablet  Commonly known as:  TOPAMAX  Take 1 tablet (25 mg total) by mouth daily.  Objective:   Physical Exam BP 124/68 mmHg  Pulse 60  Temp(Src) 97.7 F (36.5 C) (Oral)  Ht 5\' 5"  (1.651 m)  Wt 138 lb 6 oz (62.766 kg)  BMI 23.03 kg/m2  SpO2 98% General:   Well developed, well nourished . NAD.  HEENT:  Normocephalic . Face symmetric, atraumatic Lungs:  CTA B Normal respiratory effort, no intercostal retractions, no accessory muscle use. Heart: RRR,  no murmur.  No pretibial edema bilaterally  Skin: Not pale. Not jaundice Neurologic:  alert & oriented X3.  Speech normal, some difficulty w/ word finding today, gait appropriate for age and unassisted Psych--  Cognition and judgment appear intact.  Cooperative with normal attention span and concentration.  Behavior appropriate. No anxious or depressed appearing.      Assessment & Plan:   Assessment> Prediabetes  HTN Hyperlipidemia Anxiety, insomnia-- xanax, UDS low risk 07-2015 Menopausal DEXA 2012 -1.5 @ gyn Recurrent Syncope -- extensive cards eval  before, DX with autonomic insufficiency ~ 02-2015 --rx midodrine 06-2015 per cards  LBBB Traumatic SAH (after a syncope),TBI--- admitted 01-2015 , d/c to a rehab unit temporarily -- was rx seroquel (aparently for ICU delirium) -- d/c 07-2015 --rx topamax when at rehab for post [facility]  HAs -- had issues w/b/b incontinence, better as of 07-2015  PLAN Autonomic insufficiency: now on midodrine,  no recent problems. BP at home slightly increased, today is very good, recommend to continue monitoring. Traumatic SAH, TBI: Was seen with headaches, Topamax increased, doing better Allergies?: Improved with Astelin. Chart reviewed, due for a DEXA, states will do at gyn RTC 3-4 months, CPX

## 2015-08-16 NOTE — Assessment & Plan Note (Signed)
Autonomic insufficiency: now on midodrine,  no recent problems. BP at home slightly increased, today is very good, recommend to continue monitoring. Traumatic SAH, TBI: Was seen with headaches, Topamax increased, doing better Allergies?: Improved with Astelin. Chart reviewed, due for a DEXA, states will do at gyn RTC 3-4 months, CPX

## 2015-08-23 ENCOUNTER — Encounter: Payer: Self-pay | Admitting: Cardiology

## 2015-08-23 MED ORDER — MIDODRINE HCL 2.5 MG PO TABS: 2.5000 mg | ORAL_TABLET | Freq: Three times a day (TID) | ORAL | Status: DC

## 2015-09-12 ENCOUNTER — Encounter: Payer: Self-pay | Admitting: Internal Medicine

## 2015-10-03 ENCOUNTER — Other Ambulatory Visit: Payer: Self-pay | Admitting: Cardiology

## 2015-10-04 NOTE — Telephone Encounter (Signed)
It is okay to refill 

## 2015-10-04 NOTE — Telephone Encounter (Signed)
Patient is on this medication and is requesting a refill from [place], please advise

## 2015-10-07 ENCOUNTER — Other Ambulatory Visit: Payer: Self-pay | Admitting: Internal Medicine

## 2015-10-07 NOTE — Telephone Encounter (Signed)
Pt is requesting refill on Alprazolam. Dr. Ethel Rana Pt.  Last OV: 08/15/2015 Last Fill: 05/08/2015 #60 and 2RF UDS: 07/22/2015 Low risk  Please advise.

## 2015-10-07 NOTE — Telephone Encounter (Signed)
Rx faxed to [facility]  

## 2015-10-07 NOTE — Telephone Encounter (Signed)
Rx printed, awaiting PA signature.

## 2015-10-07 NOTE — Telephone Encounter (Signed)
Ok to grant 24-month supply with 0 refills in PCP absence. Will defer further fills to him.

## 2015-10-22 ENCOUNTER — Encounter: Payer: Self-pay | Admitting: Cardiology

## 2015-10-28 ENCOUNTER — Encounter: Payer: Self-pay | Admitting: Physical Medicine & Rehabilitation

## 2015-10-28 ENCOUNTER — Encounter: Payer: Medicare HMO | Attending: Physical Medicine & Rehabilitation | Admitting: Physical Medicine & Rehabilitation

## 2015-10-28 VITALS — BP 169/77 | HR 54

## 2015-10-28 DIAGNOSIS — S02119D Unspecified fracture of occiput, subsequent encounter for fracture with routine healing: Secondary | ICD-10-CM | POA: Diagnosis not present

## 2015-10-28 DIAGNOSIS — I10 Essential (primary) hypertension: Secondary | ICD-10-CM | POA: Insufficient documentation

## 2015-10-28 DIAGNOSIS — R5383 Other fatigue: Secondary | ICD-10-CM | POA: Diagnosis not present

## 2015-10-28 DIAGNOSIS — S06349D Traumatic hemorrhage of right cerebrum with loss of consciousness of unspecified duration, subsequent encounter: Secondary | ICD-10-CM | POA: Insufficient documentation

## 2015-10-28 DIAGNOSIS — F329 Major depressive disorder, single episode, unspecified: Secondary | ICD-10-CM | POA: Diagnosis not present

## 2015-10-28 DIAGNOSIS — E663 Overweight: Secondary | ICD-10-CM | POA: Insufficient documentation

## 2015-10-28 DIAGNOSIS — F419 Anxiety disorder, unspecified: Secondary | ICD-10-CM | POA: Insufficient documentation

## 2015-10-28 DIAGNOSIS — R2689 Other abnormalities of gait and mobility: Secondary | ICD-10-CM | POA: Insufficient documentation

## 2015-10-28 DIAGNOSIS — R5382 Chronic fatigue, unspecified: Secondary | ICD-10-CM | POA: Insufficient documentation

## 2015-10-28 DIAGNOSIS — E785 Hyperlipidemia, unspecified: Secondary | ICD-10-CM | POA: Insufficient documentation

## 2015-10-28 DIAGNOSIS — S069X1S Unspecified intracranial injury with loss of consciousness of 30 minutes or less, sequela: Secondary | ICD-10-CM | POA: Diagnosis not present

## 2015-10-28 DIAGNOSIS — G44319 Acute post-traumatic headache, not intractable: Secondary | ICD-10-CM | POA: Diagnosis not present

## 2015-10-28 DIAGNOSIS — S062X9D Diffuse traumatic brain injury with loss of consciousness of unspecified duration, subsequent encounter: Secondary | ICD-10-CM | POA: Insufficient documentation

## 2015-10-28 DIAGNOSIS — I447 Left bundle-branch block, unspecified: Secondary | ICD-10-CM | POA: Insufficient documentation

## 2015-10-28 MED ORDER — PAROXETINE HCL 20 MG PO TABS: 20.0000 mg | ORAL_TABLET | Freq: Every day | ORAL | 5 refills | Status: DC

## 2015-10-28 NOTE — Patient Instructions (Addendum)
FOR YOUR BOWELS: PROBIOTIC IN CAPSULE OR IN GREEK YOGURT. ?FIBER (METAMUCIL)  STOP TOPAMAX AND OBSERVER TO SEE IF YOU FEEL LESS SLEEPY IN THE MORNING   TUB BENCH FOR SHOWER.   SET SOME SHORT TERM GOALS AND LONG TERM GOALS AS IT PERTAINS TO PHYSICAL ACTIVITY AND ACTIVITY OUTSIDE THE HOME.   ?STRAIGHT CANE WHEN YOU GO OUT OF THE HOUSE.

## 2015-10-28 NOTE — Progress Notes (Signed)
Subjective:    Patient ID: Mandy Durham, female    DOB: March 04, 1946, 70 y.o.   MRN: [id number]  HPI   Nilda is here in follow up of her traumatic SAH. She feels that she has no energy and that she really hasn't done anything this summer. She admits to being depressed. Her appetite is good, and she's sleeping with the xanax at night which helps her sleep. She feels "locked" in sometimes, where she's unable to get out of the house from a physical and emotional standpoint.  She is wearing her abdominal binder/TEDs  Pain Inventory Average Pain no pain Pain Right Now 0 My pain is na  In the last 24 hours, has pain interfered with the following? General activity 0 Relation with others 0 Enjoyment of life 0 What TIME of day is your pain at its worst? na Sleep (in general) NA  Pain is worse with: na Pain improves with: na Relief from Meds: na  Mobility how many minutes can you walk? 10  Function retired  Neuro/Psych bowel control problems  Prior Studies Any changes since last visit?  no  Physicians involved in your care Any changes since last visit?  no   Family History  Problem Relation Age of Onset  . Heart failure Mother   . Coronary artery disease Mother   . Dementia Mother   . Diabetes Mother   . Colon cancer Neg Hx   . Breast cancer Neg Hx   . Hypertension Neg Hx   . Heart disease Neg Hx   . Heart attack Neg Hx   . Stroke Neg Hx    Social History   Social History  . Marital status: Married    Spouse name: N/A  . Number of children: 2  . Years of education: N/A   Occupational History  . retired Education administrator   Social History Main Topics  . Smoking status: Never Smoker  . Smokeless tobacco: Never Used  . Alcohol use No  . Drug use: No  . Sexual activity: Not Asked   Other Topics Concern  . None   Social History Narrative   HSG.  Married '65.  2 daughters, '71, '77; 4 grandchildren            Past  Surgical History:  Procedure Laterality Date  . TUBAL LIGATION     Past Medical History:  Diagnosis Date  . Adenomatous polyp of colon   . Anxiety state, unspecified   . Hyperlipidemia   . Hypertension   . Insomnia   . LBBB (left bundle branch block)    LHC in 2002 showed normal coronaries.   . Osteoarthrosis, unspecified whether generalized or localized, unspecified site   . Overweight(278.02)   . Symptomatic menopausal or female climacteric states   . Syncope and collapse    11/12: Holter 12/12 with rare PACS, HR range 64-140, average 82, no significant arrhythmias. Echo (1/13): EF 50-55%, mild LVH, septal-lateral dyssynchrony c/w LBBB. 3-week event monitor (1/13): No significant arrhythmia.    BP (!) 169/77   Pulse (!) 54   SpO2 94%   Opioid Risk Score:   Fall Risk Score:  `1  Depression screen PHQ 2/9  Depression screen [facility] 2/9 06/24/2015 04/23/2015 03/26/2015 03/22/2015 06/19/2014 05/29/2014  Decreased Interest 0 0 0 0 0 0  Down, Depressed, Hopeless 0 0 0 0 0 0  PHQ - 2 Score 0 0 0 0 0 0  Altered sleeping - - 0 - - -  Tired, decreased energy - - 0 - - -  Change in appetite - - 0 - - -  Feeling bad or failure about yourself  - - 0 - - -  Trouble concentrating - - 0 - - -  Moving slowly or fidgety/restless - - 0 - - -  Suicidal thoughts - - 0 - - -  PHQ-9 Score - - 0 - - -    Review of Systems  Constitutional: Negative.   HENT: Negative.   Eyes: Negative.   Respiratory: Negative.   Cardiovascular: Negative.   Gastrointestinal: Negative.   Endocrine: Negative.   Genitourinary: Negative.   Musculoskeletal: Negative.   Skin: Negative.   Allergic/Immunologic: Negative.   Neurological: Negative.  Negative for syncope.  Hematological: Negative.   Psychiatric/Behavioral: Negative.        Objective:   Physical Exam  Constitutional: She appears well-developed and well-nourished. No distress.  HENT:  Head: Normocephalic.  Eyes: Conjunctivae and EOM are normal.    Neck: Normal range of motion.  Cardiovascular: Normal rate and regular rhythm. +Murmur  Respiratory: Effort normal and breath sounds normal. No respiratory distress. She has no wheezes.  GI: Soft. Bowel sounds are normal. She exhibits no distension. There is no tenderness.  Musculoskeletal: She exhibits no edema or tenderness.  Neuro: speech still stuttering at times--able to convey thoughts. A&O x 3. No nystagmus.  Strength b/l UE 5/5 proximal to distal  B/l LE: 5/5 proximally to distally  She is alert and oriented to person and place  She is able to follow one and two step commands without difficulty. she ambulates without difficulty. Tandem  Gait caused some loss of balance. Romberg negative. Had difficulties standing on left leg alone Skin: Skin is warm and dry. No rash noted. She is not diaphoretic. No erythema.  Psychiatric: Her behavior is normal. Thought content normal.     Assessment & Plan:   1. Gait, balance, cognitive deficits secondary to traumatic right frontal hemorrhagic contusion, occipital skull fracture and SAH  -continue HEP -discussed setting short and long term goals 2. Local driving.   3. Pain Management: nearly resolved.  -will try off topamax 4. LBBB/Supine HTN/light headedness: On metoprolol bid for supine hypertension  -Mestonin to help with orthostatic changes---probiotic and fiber for diarrhea  -teds/abdominal binder, encourage fluids, extra time to sit EOB or EOC before standing.  -TUB BENCH FOR SHOWER? 5. Fatigue/depression  -stop topamax  -increase paxil to 20mg   -check thyroid levels, cbc, cmet    Thirty minutes of face to face patient care time were spent during this visit. All questions were encouraged and answered.  I'll see her back in about 6 weeks

## 2015-10-29 LAB — TSH: TSH: 1.59 u[IU]/mL (ref 0.450–4.500)

## 2015-10-29 LAB — COMPREHENSIVE METABOLIC PANEL
A/G RATIO: 1.4 (ref 1.2–2.2)
ALT: 19 IU/L (ref 0–32)
AST: 23 IU/L (ref 0–40)
Albumin: 3.9 g/dL (ref 3.5–4.8)
Alkaline Phosphatase: 89 IU/L (ref 39–117)
BILIRUBIN TOTAL: 0.3 mg/dL (ref 0.0–1.2)
BUN/Creatinine Ratio: 15 (ref 12–28)
BUN: 12 mg/dL (ref 8–27)
CALCIUM: 9.4 mg/dL (ref 8.7–10.3)
CHLORIDE: 102 mmol/L (ref 96–106)
CO2: 26 mmol/L (ref 18–29)
Creatinine, Ser: 0.78 mg/dL (ref 0.57–1.00)
GFR calc Af Amer: 88 mL/min/{1.73_m2} (ref >59)
GFR, EST NON AFRICAN AMERICAN: 77 mL/min/{1.73_m2} (ref >59)
GLUCOSE: 87 mg/dL (ref 65–99)
Globulin, Total: 2.8 g/dL (ref 1.5–4.5)
POTASSIUM: 4.5 mmol/L (ref 3.5–5.2)
Sodium: 142 mmol/L (ref 134–144)
Total Protein: 6.7 g/dL (ref 6.0–8.5)

## 2015-10-29 LAB — CBC
HEMOGLOBIN: 14.2 g/dL (ref 11.1–15.9)
Hematocrit: 44.1 % (ref 34.0–46.6)
MCH: 27.8 pg (ref 26.6–33.0)
MCHC: 32.2 g/dL (ref 31.5–35.7)
MCV: 86 fL (ref 79–97)
PLATELETS: 337 10*3/uL (ref 150–379)
RBC: 5.11 x10E6/uL (ref 3.77–5.28)
RDW: 14.2 % (ref 12.3–15.4)
WBC: 6.9 10*3/uL (ref 3.4–10.8)

## 2015-10-29 LAB — T3, FREE: T3 FREE: 2.4 pg/mL (ref 2.0–4.4)

## 2015-10-29 LAB — T4, FREE: Free T4: 0.85 ng/dL (ref 0.82–1.77)

## 2015-10-30 ENCOUNTER — Telehealth: Payer: Self-pay | Admitting: Physical Medicine & Rehabilitation

## 2015-10-30 NOTE — Telephone Encounter (Signed)
I called Mandy Durham and left VM to call our office.   Thyroid testing shows thyroid at absolute lowest level of normal and may be accounting for some of fatigue. Before supplementing I would like to let paxil work and observe off the topamax to see how she responds. If fatigue improves, we can hold off on adding thyroid supplementation. If not, we'll start a low dose thyroid supp.   thanks

## 2015-10-30 NOTE — Telephone Encounter (Signed)
Pt called back, I passed on Dr. Charm Barges findings and advised the patient to follow his recommendations to give the Plavix a chance all the while coming off of the topomax.  I advised that she call us back if the fatigue does not subside.  She understood and agreed

## 2015-11-05 ENCOUNTER — Encounter: Payer: Self-pay | Admitting: Cardiology

## 2015-11-05 ENCOUNTER — Ambulatory Visit (INDEPENDENT_AMBULATORY_CARE_PROVIDER_SITE_OTHER): Payer: Medicare HMO | Admitting: Cardiology

## 2015-11-05 VITALS — BP 150/80 | HR 59 | Ht 65.0 in | Wt 136.0 lb

## 2015-11-05 DIAGNOSIS — I447 Left bundle-branch block, unspecified: Secondary | ICD-10-CM

## 2015-11-05 DIAGNOSIS — R42 Dizziness and giddiness: Secondary | ICD-10-CM | POA: Diagnosis not present

## 2015-11-05 DIAGNOSIS — I10 Essential (primary) hypertension: Secondary | ICD-10-CM

## 2015-11-05 DIAGNOSIS — R55 Syncope and collapse: Secondary | ICD-10-CM

## 2015-11-05 DIAGNOSIS — I150 Renovascular hypertension: Secondary | ICD-10-CM | POA: Diagnosis not present

## 2015-11-05 NOTE — Patient Instructions (Signed)
Medication Instructions:   Your physician recommends that you continue on your current medications as directed. Please refer to the Current Medication list given to you today.   If you need a refill on your cardiac medications before your next appointment, please call your pharmacy.  Labwork: NONE ORDER TODAY    Testing/Procedures: NONE ORDER TODAY    Follow-Up: Your physician wants you to follow-up in:  IN  6  MONTHS WITH DR END   You will receive a reminder letter in the mail two months in advance. If you don't receive a letter, please call our office to schedule the follow-up appointment.      Any Other Special Instructions Will Be Listed Below (If Applicable).

## 2015-11-06 NOTE — Progress Notes (Signed)
Patient ID: Mandy Durham, female   DOB: May 30, 1945, 70 y.o.   MRN: [id number] PCP: Dr. Linna Darner  70 yo with history of chronic LBBB and syncope returns for cardiology followup. LBBB has been noted since at least 2000.  She had a cath in 2002 because of chest pain that showed no angiographic CAD.  I had her do an echocardiogram in 1/13 that showed EF 50-55% with septal-lateral dyssynchrony consistent with LBBB.   For several years, she has had episodes of syncope and presyncope.  She tends to get lightheaded with prolonged standing or walking at times.  Occasionally, she would get severely lightheaded and pass out or nearly pass out.  All episodes seem to have been while standing and have a prodrome of lightheadedness, nausea, and warmth.  No chest pain or tachypalpitations.  She does notice that when she drinks a lot of fluid, she does not feel as lightheaded.  No exertional dyspnea.  At a prior appointment, I started her on Florinef.  She does not think this helped much.  Her supine BP runs high and she had further episodes on Florinef.  Therefore, I had her start pyridostigmine and increased to tid.  This helped considerably initially.  However, in 11/16, she was standing at her sink and felt faint.  She passed out and hit her head => small subarachnoid hemorrhage.  She did not have to have surgery.  I then had her wear an abdominal binder in addition to compressions stockings. She thinks that this has helped some (less lightheaded spells).  I then increased pyridostigmine to 90 mg tid.  She continued to have spells of lightheadedness when standing and had additional syncope.  Finally, I added midodrine 2.5 mg tid.  This seems to have helped.  Last syncopal episode was about 6 weeks ago while doing housework.  Now, she has only very mild lightheadedness if she stands too fast.  She denies exertional dyspnea or chest pain.  SBP has been running around 150 with addition of midodrine.  ECG: NSR, LBBB  Labs  (4/12): LDL 80, HDL 123, TSH normal Labs (11/12): K 4, creatinine 0.8 Labs (8/14): LDL 107, HDL 102 Labs (11/14): K 3.4, creatinine 0.7 Labs (5/15): K 3.8, creatinine 0.8, TSH normal Labs (3/16); K 4.1, creatinine 0.75 Labs (6/16): LDL 140, HDL 91 Labs (11/16): K 4, creatinine 0.88 Labs (8/17): K 4.5, creatinine 0.78  PMH: 1. Depression 2. Hyperlipidemia 3. Osteoarthritis 4. LBBB since around 2000.  LHC in 2002 showed normal coronaries.   5. Syncope: Holter 12/12 with rare PACS, HR range 64-140, average 82, no significant arrhythmias.  Echo (1/13): EF 50-55%, mild LVH, septal-lateral dyssynchrony c/w LBBB.  3-week event monitor (1/13): No significant arrhythmia.  EEG (11/14) normal.  Suspect autonomic insufficiency.  Fall in 11/16 with SAH (due to syncope). Now on pyridostigmine and midodrine.  6. H/o labyrinthitis  SH: Lives in [place], married, nonsmoker, works as an Biochemist, clinical.   FH: No CAD that she knows of.  No CHF.   ROS: All systems reviewed and negative except as per HPI.    Current Outpatient Prescriptions  Medication Sig Dispense Refill  . acetaminophen (TYLENOL) 500 MG tablet Take 500 mg by mouth every 6 (six) hours as needed.    . ALPRAZolam (XANAX) 0.25 MG tablet Take 1-2 tablets (0.25-0.5 mg total) by mouth at bedtime as needed for sleep. 60 tablet 0  . azelastine (ASTELIN) 0.1 % nasal spray Place 2 sprays into both nostrils 2 (two)  times daily. 30 mL 6  . metoprolol tartrate (LOPRESSOR) 25 MG tablet Take 1 tablet (25 mg total) by mouth 2 (two) times daily. 180 tablet 3  . midodrine (PROAMATINE) 2.5 MG tablet Take 1 tablet (2.5 mg total) by mouth 3 (three) times daily with meals. 90 tablet 5  . PARoxetine (PAXIL) 20 MG tablet Take 1 tablet (20 mg total) by mouth daily. 30 tablet 5  . pyridostigmine (MESTINON) 60 MG tablet TAKE 1 AND 1/2 TABLETS BYMOUTH 3 TIMES A DAY 270 tablet 1   No current facility-administered medications for this visit.     BP (!)  150/80   Pulse (!) 59   Ht 5\' 5"  (1.651 m)   Wt 136 lb (61.7 kg)   BMI 22.63 kg/m  General: NAD Neck: No JVD, no thyromegaly or thyroid nodule.  Lungs: Clear to auscultation bilaterally with normal respiratory effort. CV: Nondisplaced PMI.  Heart regular S1/S2, no S3/S4, no murmur.  No peripheral edema.  No carotid bruit.  Normal pedal pulses.  Abdomen: Soft, nontender, no hepatosplenomegaly, no distention.  Neurologic: Alert and oriented x 3.  Psych: Normal affect. Extremities: No clubbing or cyanosis.   Assessment/Plan: 1. Lightheadedness/syncope: Always when standing, brief in duration. I suspect that these spells represent autonomic insufficiency/orthostatic hypotension.  Past event monitoring has shown no arrhythmia. Pyridostigmine helped, but she had breakthrough symptoms despite increasing to 90 mg tid. She is wearing an abdominal binder and compression stockings. Midodrine 2.5 mg tid was added with considerable improvement.  - Continue pyridostigmine 90 mg tid and midodrine 2.5 mg tid.  With SBP around 150, I will not titrate up midodrine.   - Continue to use abdominal binder and compression stockings.  - She needs to get regular exercise but avoid dehydration.  . 2. LBBB: Chronic.  No ischemic symptoms.  3. Supine HTN with orthostatic hypotension: She has been on metoprolol 25 mg bid.    Given my transition to CHF clinic, she will followup in 6 months with Dr End.   Loralie Champagne 11/06/2015

## 2015-12-09 ENCOUNTER — Ambulatory Visit: Payer: Medicare HMO | Admitting: Physical Medicine & Rehabilitation

## 2015-12-13 ENCOUNTER — Other Ambulatory Visit: Payer: Self-pay | Admitting: Obstetrics and Gynecology

## 2015-12-13 DIAGNOSIS — R928 Other abnormal and inconclusive findings on diagnostic imaging of breast: Secondary | ICD-10-CM

## 2015-12-16 ENCOUNTER — Encounter: Payer: Self-pay | Admitting: Cardiology

## 2015-12-18 ENCOUNTER — Ambulatory Visit
Admission: RE | Admit: 2015-12-18 | Discharge: 2015-12-18 | Disposition: A | Discharge status: Home / Self Care | Payer: Medicare HMO | Source: Ambulatory Visit | Attending: Obstetrics and Gynecology | Admitting: Obstetrics and Gynecology

## 2015-12-18 DIAGNOSIS — R928 Other abnormal and inconclusive findings on diagnostic imaging of breast: Secondary | ICD-10-CM

## 2015-12-23 ENCOUNTER — Ambulatory Visit: Payer: Medicare HMO | Admitting: Physical Medicine & Rehabilitation

## 2015-12-23 ENCOUNTER — Other Ambulatory Visit: Payer: Self-pay | Admitting: Physician Assistant

## 2015-12-24 ENCOUNTER — Encounter: Payer: Medicare HMO | Attending: Physical Medicine & Rehabilitation | Admitting: Physical Medicine & Rehabilitation

## 2015-12-24 ENCOUNTER — Encounter: Payer: Self-pay | Admitting: Physical Medicine & Rehabilitation

## 2015-12-24 VITALS — BP 140/73 | HR 60 | Resp 14

## 2015-12-24 DIAGNOSIS — E663 Overweight: Secondary | ICD-10-CM | POA: Diagnosis not present

## 2015-12-24 DIAGNOSIS — R5383 Other fatigue: Secondary | ICD-10-CM | POA: Insufficient documentation

## 2015-12-24 DIAGNOSIS — S02119D Unspecified fracture of occiput, subsequent encounter for fracture with routine healing: Secondary | ICD-10-CM | POA: Insufficient documentation

## 2015-12-24 DIAGNOSIS — S069X3S Unspecified intracranial injury with loss of consciousness of 1 hour to 5 hours 59 minutes, sequela: Secondary | ICD-10-CM | POA: Diagnosis not present

## 2015-12-24 DIAGNOSIS — F329 Major depressive disorder, single episode, unspecified: Secondary | ICD-10-CM | POA: Diagnosis not present

## 2015-12-24 DIAGNOSIS — R2689 Other abnormalities of gait and mobility: Secondary | ICD-10-CM | POA: Insufficient documentation

## 2015-12-24 DIAGNOSIS — I447 Left bundle-branch block, unspecified: Secondary | ICD-10-CM | POA: Insufficient documentation

## 2015-12-24 DIAGNOSIS — F419 Anxiety disorder, unspecified: Secondary | ICD-10-CM | POA: Diagnosis not present

## 2015-12-24 DIAGNOSIS — S06349D Traumatic hemorrhage of right cerebrum with loss of consciousness of unspecified duration, subsequent encounter: Secondary | ICD-10-CM | POA: Insufficient documentation

## 2015-12-24 DIAGNOSIS — E785 Hyperlipidemia, unspecified: Secondary | ICD-10-CM | POA: Diagnosis not present

## 2015-12-24 DIAGNOSIS — S062X9D Diffuse traumatic brain injury with loss of consciousness of unspecified duration, subsequent encounter: Secondary | ICD-10-CM | POA: Diagnosis present

## 2015-12-24 DIAGNOSIS — I10 Essential (primary) hypertension: Secondary | ICD-10-CM | POA: Insufficient documentation

## 2015-12-24 MED ORDER — LEVOTHYROXINE SODIUM 25 MCG PO TABS: 25.0000 ug | ORAL_TABLET | Freq: Every day | ORAL | 3 refills | Status: DC

## 2015-12-24 MED ORDER — PAROXETINE HCL 20 MG PO TABS: 10.0000 mg | ORAL_TABLET | Freq: Every day | ORAL | 5 refills | Status: DC

## 2015-12-24 NOTE — Progress Notes (Signed)
Subjective:    Patient ID: Darnetta S Glanz, female    DOB: 1945/11/04, 70 y.o.   MRN: [id number]  HPI   Mrs. Bailin is here in follow up of her traumatic right frontal ICH. We stopped her topamax and her energy levels have been better although her energy is still not where she would like it to be. She does take rest breaks and paces herself when she can.   She cut the paxil down to 10mg  because the 20mg  made her hungry.   Terryl still notices tremors in her left hand which are more a nuisance than anything else.   She's only had one "dizzy" episode when she stood up too fast after bending over.   Latriece asked if she was ok to drive to "[place]" or another store if she felt up to it.   Pain Inventory Average Pain 0 Pain Right Now 0 My pain is no pain  In the last 24 hours, has pain interfered with the following? General activity 0 Relation with others 0 Enjoyment of life 0 What TIME of day is your pain at its worst? no pain Sleep (in general) Fair  Pain is worse with: no pain Pain improves with: no pain Relief from Meds: no pain  Mobility walk without assistance how many minutes can you walk? 10 ability to climb steps?  yes do you drive?  yes Do you have any goals in this area?  yes  Function retired  Neuro/Psych tremor  Prior Studies Any changes since last visit?  no  Physicians involved in your care Any changes since last visit?  no   Family History  Problem Relation Age of Onset  . Heart failure Mother   . Coronary artery disease Mother   . Dementia Mother   . Diabetes Mother   . Colon cancer Neg Hx   . Breast cancer Neg Hx   . Hypertension Neg Hx   . Heart disease Neg Hx   . Heart attack Neg Hx   . Stroke Neg Hx    Social History   Social History  . Marital status: Married    Spouse name: N/A  . Number of children: 2  . Years of education: N/A   Occupational History  . retired Education administrator   Social History  Main Topics  . Smoking status: Never Smoker  . Smokeless tobacco: Never Used  . Alcohol use No  . Drug use: No  . Sexual activity: Not Asked   Other Topics Concern  . None   Social History Narrative   HSG.  Married '65.  2 daughters, '71, '77; 4 grandchildren            Past Surgical History:  Procedure Laterality Date  . TUBAL LIGATION     Past Medical History:  Diagnosis Date  . Adenomatous polyp of colon   . Anxiety state, unspecified   . Hyperlipidemia   . Hypertension   . Insomnia   . LBBB (left bundle branch block)    LHC in 2002 showed normal coronaries.   . Osteoarthrosis, unspecified whether generalized or localized, unspecified site   . Overweight(278.02)   . Symptomatic menopausal or female climacteric states   . Syncope and collapse    11/12: Holter 12/12 with rare PACS, HR range 64-140, average 82, no significant arrhythmias. Echo (1/13): EF 50-55%, mild LVH, septal-lateral dyssynchrony c/w LBBB. 3-week event monitor (1/13): No significant arrhythmia.    There were no vitals  taken for this visit.  Opioid Risk Score:   Fall Risk Score:  `1  Depression screen PHQ 2/9  Depression screen [facility] 2/9 06/24/2015 04/23/2015 03/26/2015 03/22/2015 06/19/2014 05/29/2014  Decreased Interest 0 0 0 0 0 0  Down, Depressed, Hopeless 0 0 0 0 0 0  PHQ - 2 Score 0 0 0 0 0 0  Altered sleeping - - 0 - - -  Tired, decreased energy - - 0 - - -  Change in appetite - - 0 - - -  Feeling bad or failure about yourself  - - 0 - - -  Trouble concentrating - - 0 - - -  Moving slowly or fidgety/restless - - 0 - - -  Suicidal thoughts - - 0 - - -  PHQ-9 Score - - 0 - - -    Review of Systems  Cardiovascular: Negative.   Gastrointestinal: Negative.   Endocrine: Negative.   Musculoskeletal: Negative.   Skin: Negative.   Allergic/Immunologic: Negative.   Neurological: Positive for tremors and headaches.  Hematological: Negative.   Psychiatric/Behavioral: Negative.   All other systems  reviewed and are negative.      Objective:   Physical Exam  Constitutional: She appears well-developed and well-nourished. No distress.  HENT:  Head: Normocephalic.  Eyes: Conjunctivae and EOM are normal.  Neck: Normal range of motion.  Cardiovascular: Normal rate and regular rhythm. +Murmur  Respiratory: Effort normal and breath sounds normal. No respiratory distress. She has no wheezes.  GI: Soft. Bowel sounds are normal. She exhibits no distension. There is no tenderness.  Musculoskeletal: She exhibits no edema or tenderness.  Neuro: speech still stuttering at times--able to convey thoughts. A&O x 3. No nystagmus.  Strength b/l UE 5/5 proximal to distal  B/l LE: 5/5 proximally to distally She is alert and oriented to person and place She is able to follow one and two step commands without difficulty. she ambulates without difficulty. Tandem  Gait improved. Has mid frequecy resting tremor of left hand. Dissipates with intentional movements. Head tends to bob too.   Skin: Skin is warm and dry. No rash noted. She is not diaphoretic. No erythema.  Psychiatric: Her behavior is normal. Thought content normal.     Assessment & Plan:  1. Gait, balance, cognitive deficits secondary to traumatic right frontal hemorrhagic contusion, occipital skull fracture and SAH  -she has made nice progress! Continued HEP 2. Local driving is fine.  3. Resting tremor---LUE  -likely due to injury. Also have concerns that mestinon and midodrine are exacerbating  -asked to her discuss with cardiology at follow up  -prefer to not to medicate tremor given #5 and #1 but if it's impacting ADL's/QOL we may need to. 4. LBBB/Supine HTN/light headedness: On metoprolol bid for supine hypertension  -Mestonin/midodrine (see a bove)  -teds/abdominal binder, encourage fluids, extra time to sit EOB or EOC before standing.    5. Fatigue/depression             -stop topamax             -reduced paxil to 10mg               -low dose synthroid 67mcg daily to help see if this give her a boost (her T4 was in the extreme low end of normal category on lab analysis)  Thirty minutes of face to face patient care time were spent during this visit. All questions were encouraged and answered.  I'll see her back in about  3 MONTHS.

## 2015-12-24 NOTE — Patient Instructions (Signed)
ASK DR. MCCLAIN WHEN YOU SEE HIM ABOUT THE MESTINON AND MIDODRINE DOSES----CAN THEY BE REDUCED??? I AM CONCERNED THAT THESE MIGHT BE CONTRIBUTING TO YOUR TREMOR.    PLEASE CALL ME WITH ANY PROBLEMS OR QUESTIONS [phone])

## 2015-12-26 ENCOUNTER — Telehealth: Payer: Self-pay | Admitting: Internal Medicine

## 2015-12-26 ENCOUNTER — Telehealth: Payer: Self-pay | Admitting: Physician Assistant

## 2015-12-26 NOTE — Telephone Encounter (Signed)
Last seen 08/15/15 Last filled #[id number]- [id number] rf 10/07/15 Please advise

## 2015-12-26 NOTE — Telephone Encounter (Signed)
Relation to [id number] Call back number:[phone] Pharmacy: [facility] ([address] [place] [phone] (Phone) [phone] (Fax)     Reason for call:  Patient requesting a refill ALPRAZolam (XANAX) 0.25 MG tablet

## 2015-12-27 NOTE — Telephone Encounter (Signed)
Rx faxed to [facility]  

## 2015-12-27 NOTE — Telephone Encounter (Signed)
Rx printed, awaiting MD signature.  

## 2015-12-27 NOTE — Telephone Encounter (Signed)
Refill request w/ PCP. Awaiting response.

## 2015-12-27 NOTE — Telephone Encounter (Signed)
Okay Xanax, #60 no refills

## 2016-01-07 ENCOUNTER — Telehealth: Payer: Self-pay | Admitting: *Deleted

## 2016-01-07 NOTE — Telephone Encounter (Signed)
Patient left message stating that she had a conversation with Dr. Naaman Plummer during her last visit regarding her tremors.  She added that Dr. Naaman Plummer had concerns about such medication as it most likely would cause her. to be sleepy. Upon review of clinic visit note, I confirmed this conversation and reached out to the patient via phone to confirm how the tremors are affecting her ADL's/QOL. Unable to reach patient, left message for patient to call back

## 2016-01-07 NOTE — Telephone Encounter (Signed)
Patient called back. I asked her about her tremors and how they affect her daily living and quality of life. She said the tremors are not affecting that aspect.  What she is hoping for is to have the ability to take a break from the tremors in the afternoon.....Marland Kitchento relax, read a book, etc. I informed that I would pass this information on to Dr. Naaman Plummer.  I let the patient know that it would not be addressed until Monday, October 30th. I told her we would call her to inform on Dr. Charm Barges decision

## 2016-01-10 MED ORDER — PRIMIDONE 50 MG PO TABS: 50.0000 mg | ORAL_TABLET | Freq: Every day | ORAL | 2 refills | Status: DC | PRN

## 2016-01-10 NOTE — Telephone Encounter (Signed)
Sent in a scrip for [facility] to [facility]

## 2016-01-10 NOTE — Telephone Encounter (Signed)
Patient has been notified that Rx has been sent to pharmacy.

## 2016-01-13 ENCOUNTER — Encounter: Payer: Self-pay | Admitting: Internal Medicine

## 2016-01-13 ENCOUNTER — Telehealth: Payer: Self-pay

## 2016-01-13 ENCOUNTER — Ambulatory Visit (INDEPENDENT_AMBULATORY_CARE_PROVIDER_SITE_OTHER): Payer: Medicare HMO | Admitting: Internal Medicine

## 2016-01-13 VITALS — BP 118/72 | HR 58 | Temp 97.6°F | Resp 12 | Ht 65.0 in | Wt 137.2 lb

## 2016-01-13 DIAGNOSIS — Z09 Encounter for follow-up examination after completed treatment for conditions other than malignant neoplasm: Secondary | ICD-10-CM

## 2016-01-13 DIAGNOSIS — R739 Hyperglycemia, unspecified: Secondary | ICD-10-CM | POA: Diagnosis not present

## 2016-01-13 DIAGNOSIS — Z0000 Encounter for general adult medical examination without abnormal findings: Secondary | ICD-10-CM

## 2016-01-13 DIAGNOSIS — G9601 Cranial cerebrospinal fluid leak, spontaneous: Secondary | ICD-10-CM

## 2016-01-13 DIAGNOSIS — Z23 Encounter for immunization: Secondary | ICD-10-CM

## 2016-01-13 DIAGNOSIS — I10 Essential (primary) hypertension: Secondary | ICD-10-CM | POA: Diagnosis not present

## 2016-01-13 DIAGNOSIS — Z780 Asymptomatic menopausal state: Secondary | ICD-10-CM

## 2016-01-13 DIAGNOSIS — E785 Hyperlipidemia, unspecified: Secondary | ICD-10-CM

## 2016-01-13 DIAGNOSIS — G960 Cerebrospinal fluid leak: Secondary | ICD-10-CM

## 2016-01-13 DIAGNOSIS — E039 Hypothyroidism, unspecified: Secondary | ICD-10-CM

## 2016-01-13 DIAGNOSIS — M8580 Other specified disorders of bone density and structure, unspecified site: Secondary | ICD-10-CM

## 2016-01-13 LAB — TSH: TSH: 1.55 u[IU]/mL (ref 0.35–4.50)

## 2016-01-13 LAB — LIPID PANEL
CHOLESTEROL: 289 mg/dL — AB (ref 0–200)
HDL: 101.5 mg/dL (ref >39.00)
LDL Cholesterol: 163 mg/dL — ABNORMAL HIGH (ref 0–99)
NonHDL: 187.09
TRIGLYCERIDES: 121 mg/dL (ref 0.0–149.0)
Total CHOL/HDL Ratio: 3
VLDL: 24.2 mg/dL (ref 0.0–40.0)

## 2016-01-13 LAB — MICROALBUMIN / CREATININE URINE RATIO
CREATININE, U: 154.7 mg/dL
MICROALB UR: 0.9 mg/dL (ref 0.0–1.9)
MICROALB/CREAT RATIO: 0.6 mg/g (ref 0.0–30.0)

## 2016-01-13 LAB — HEMOGLOBIN A1C: Hgb A1c MFr Bld: 6.1 % (ref 4.6–6.5)

## 2016-01-13 NOTE — Telephone Encounter (Signed)
Per [facility]-Dr. McCombs office, dexa scan was completed earlier this year. Next not due until 2019. Would not send results over w/o medical release.

## 2016-01-13 NOTE — Progress Notes (Signed)
Pre visit review using our clinic review tool, if applicable. No additional management support is needed unless otherwise documented below in the visit note. 

## 2016-01-13 NOTE — Telephone Encounter (Signed)
As long as it was done is okay, at the next opportunity will get a release of information.

## 2016-01-13 NOTE — Progress Notes (Signed)
Subjective:    Patient ID: Mandy Durham, female    DOB: 12/19/1945, 70 y.o.   MRN: [id number]  DOS:  01/13/2016 Type of visit - description : Complete physical exam Interval history: History of autonomic insufficiency, good medication compliance, had a fall 2 months ago. HTN: Ambulatory BPs occasionally elevated at 140, 150. Diastolic usually in the [id number]. Hypothyroidism: Good compliance of medication. TBI: Closely follow-up by Dr. Tessa Lerner, she seems to be gaining ground and feeling better. ROS negative, see below   Review of Systems Constitutional: No fever. No chills. No unexplained wt changes. No unusual sweats  HEENT: No dental problems, no ear discharge, no facial swelling, no voice changes. No eye discharge, no eye  redness , no  intolerance to light   Respiratory: No wheezing , no  difficulty breathing. No cough , no mucus production  Cardiovascular: No CP, no leg swelling , no  Palpitations  GI: no nausea, no vomiting, no diarrhea , no  abdominal pain.  No blood in the stools. No dysphagia, no odynophagia    Endocrine: No polyphagia, no polyuria , no polydipsia  GU: No dysuria, gross hematuria, difficulty urinating. No urinary urgency, no frequency.  Musculoskeletal: No joint swellings or unusual aches or pains  Skin: No change in the color of the skin, palor , no  Rash  Allergic, immunologic: No environmental allergies , no  food allergies  Neurological: No dizziness no  syncope. No headaches. No diplopia, no slurred, no slurred speech, no motor deficits, no facial  Numbness  Hematological: No enlarged lymph nodes, no easy bruising , no unusual bleedings  Psychiatry: No suicidal ideas, no hallucinations, no beavior problems, no confusion.  No unusual/severe anxiety, no depression   Past Medical History:  Diagnosis Date  . Adenomatous polyp of colon   . Anxiety state, unspecified   . Hyperlipidemia   . Hypertension   . Insomnia   . LBBB (left bundle branch  block)    LHC in 2002 showed normal coronaries.   . Osteoarthrosis, unspecified whether generalized or localized, unspecified site   . Overweight(278.02)   . Symptomatic menopausal or female climacteric states   . Syncope and collapse    11/12: Holter 12/12 with rare PACS, HR range 64-140, average 82, no significant arrhythmias. Echo (1/13): EF 50-55%, mild LVH, septal-lateral dyssynchrony c/w LBBB. 3-week event monitor (1/13): No significant arrhythmia.     Past Surgical History:  Procedure Laterality Date  . TUBAL LIGATION      Social History   Social History  . Marital status: Married    Spouse name: N/A  . Number of children: 2  . Years of education: N/A   Occupational History  . retired Education administrator   Social History Main Topics  . Smoking status: Never Smoker  . Smokeless tobacco: Never Used  . Alcohol use No  . Drug use: No  . Sexual activity: Not on file   Other Topics Concern  . Not on file   Social History Narrative   HSG.  Married '65.  2 daughters, '71, '77; 4 grandchildren            Family History  Problem Relation Age of Onset  . Heart failure Mother   . Coronary artery disease Mother   . Dementia Mother   . Diabetes Mother   . Colon cancer Neg Hx   . Breast cancer Neg Hx   . Hypertension Neg Hx   . Heart disease Neg  Hx   . Heart attack Neg Hx   . Stroke Neg Hx          Medication List       Accurate as of 01/13/16 11:59 PM. Always use your most recent med list.          acetaminophen 500 MG tablet Commonly known as:  TYLENOL Take 500 mg by mouth every 6 (six) hours as needed.   ALPRAZolam 0.25 MG tablet Commonly known as:  XANAX Take 1-2 tablets (0.25-0.5 mg total) by mouth at bedtime as needed for sleep.   azelastine 0.1 % nasal spray Commonly known as:  ASTELIN Place 2 sprays into both nostrils 2 (two) times daily.   levothyroxine 25 MCG tablet Commonly known as:  SYNTHROID,  LEVOTHROID Take 1 tablet (25 mcg total) by mouth daily before breakfast.   metoprolol tartrate 25 MG tablet Commonly known as:  LOPRESSOR Take 1 tablet (25 mg total) by mouth 2 (two) times daily.   midodrine 2.5 MG tablet Commonly known as:  PROAMATINE Take 1 tablet (2.5 mg total) by mouth 3 (three) times daily with meals.   PARoxetine 20 MG tablet Commonly known as:  PAXIL Take 0.5 tablets (10 mg total) by mouth daily.   primidone 50 MG tablet Commonly known as:  MYSOLINE Take 1 tablet (50 mg total) by mouth daily as needed. For tremors   pyridostigmine 60 MG tablet Commonly known as:  MESTINON TAKE 1 AND 1/2 TABLETS BYMOUTH 3 TIMES A DAY          Objective:   Physical Exam BP 118/72 (BP Location: Left Arm, Patient Position: Sitting, Cuff Size: Small)   Pulse (!) 58   Temp 97.6 F (36.4 C) (Oral)   Resp 12   Ht 5\' 5"  (1.651 m)   Wt 137 lb 4 oz (62.3 kg)   SpO2 94%   BMI 22.84 kg/m  General:   Well developed, well nourished . NAD.  Neck: No  thyromegaly  HEENT:  Normocephalic . Face symmetric, atraumatic Lungs:  CTA B Normal respiratory effort, no intercostal retractions, no accessory muscle use. Heart: RRR,  no murmur.  No pretibial edema bilaterally  Abdomen:  Not distended, soft, non-tender. No rebound or rigidity.   Skin: Exposed areas without rash. Not pale. Not jaundice Neurologic:  alert & oriented X3, some difficulty with word finding but otherwise speech is normal. Gait: Unassisted, slightly slow. Strength symmetric and appropriate for age. + Left upper extremity tremors  Psych: Cognition and judgment appear intact.  Cooperative with normal attention span and concentration.  Behavior appropriate. No anxious or depressed appearing.     Assessment & Plan:  Assessment> Prediabetes  HTN Hyperlipidemia Anxiety, insomnia-- xanax, UDS low risk 07-2015 Menopausal; DEXA 2012 -1.5 @ gyn Recurrent Syncope -- extensive cards eval before, DX with  autonomic insufficiency ~ 02-2015 --rx midodrine 06-2015 per cards  LBBB Traumatic SAH (after a syncope),TBI--- admitted 01-2015 , d/c to a rehab unit temporarily -- was rx seroquel (aparently for ICU delirium) -- d/c 07-2015 --rx topamax [facility]  HAs -- had issues w/b/b incontinence, better as of 07-2015 Tremors LUE, onset 9 -2017  PLAN Prediabetes: Diet control, check an A1c and micro HTN: Excellent control today, at home diastolic BP on the high side. Recommend no change for now. Hyperlipidemia: Diet control, check FLP Hypothyroidism: Check a TSH Anxiety and insomnia: On Xanax and Paxil, controlled. Autonomic insufficiency: Last time she had a fall was 2 months  ago in the context of getting up very quickly, she is avoiding those situations, no change. Tremor, left arm, onset 11-2015. Related to TBI?Marland Kitchen Will reassess on RTC. Insomnia: Likes to stop Xanax, she is taking 0.25, 1/4 of a tab, okay to stop it, keeps for healthy sleep habits discussed. Multiple questions answered. Allergies? Continue with runny nose, described as salty, clear water.sx started after head injury. Refer to ENT, CSF leak? RTC 4 months

## 2016-01-13 NOTE — Patient Instructions (Signed)
Get your blood work before you leave   Think about a healthcare power of attorney if you don't have one  Next visit in 4 months      Fall Prevention and [place] cause injuries and can affect all age groups. It is possible to use preventive measures to significantly decrease the likelihood of falls. There are many simple measures which can make your home safer and prevent falls. OUTDOORS  Repair cracks and edges of walkways and driveways.  Remove high doorway thresholds.  Trim shrubbery on the main path into your home.  Have good outside lighting.  Clear walkways of tools, rocks, debris, and clutter.  Check that handrails are not broken and are securely fastened. Both sides of steps should have handrails.  Have leaves, snow, and ice cleared regularly.  Use sand or salt on walkways during winter months.  In the garage, clean up grease or oil spills. BATHROOM  Install night lights.  Install grab bars by the toilet and in the tub and shower.  Use non-skid mats or decals in the tub or shower.  Place a plastic non-slip stool in the shower to sit on, if needed.  Keep floors dry and clean up all water on the floor immediately.  Remove soap buildup in the tub or shower on a regular basis.  Secure bath mats with non-slip, double-sided rug tape.  Remove throw rugs and tripping hazards from the floors. BEDROOMS  Install night lights.  Make sure a bedside light is easy to reach.  Do not use oversized bedding.  Keep a telephone by your bedside.  Have a firm chair with side arms to use for getting dressed.  Remove throw rugs and tripping hazards from the floor. KITCHEN  Keep handles on pots and pans turned toward the center of the stove. Use back burners when possible.  Clean up spills quickly and allow time for drying.  Avoid walking on wet floors.  Avoid hot utensils and knives.  Position shelves so they are not too high or low.  Place commonly  used objects within easy reach.  If necessary, use a sturdy step stool with a grab bar when reaching.  Keep electrical cables out of the way.  Do not use floor polish or wax that makes floors slippery. If you must use wax, use non-skid floor wax.  Remove throw rugs and tripping hazards from the floor. STAIRWAYS  Never leave objects on stairs.  Place handrails on both sides of stairways and use them. Fix any loose handrails. Make sure handrails on both sides of the stairways are as long as the stairs.  Check carpeting to make sure it is firmly attached along stairs. Make repairs to worn or loose carpet promptly.  Avoid placing throw rugs at the top or bottom of stairways, or properly secure the rug with carpet tape to prevent slippage. Get rid of throw rugs, if possible.  Have an electrician put in a light switch at the top and bottom of the stairs. OTHER FALL PREVENTION TIPS  Wear low-heel or rubber-soled shoes that are supportive and fit well. Wear closed toe shoes.  When using a stepladder, make sure it is fully opened and both spreaders are firmly locked. Do not climb a closed stepladder.  Add color or contrast paint or tape to grab bars and handrails in your home. Place contrasting color strips on first and last steps.  Learn and use mobility aids as needed. Install an electrical emergency response system.  Turn on lights to avoid dark areas. Replace light bulbs that burn out immediately. Get light switches that glow.  Arrange furniture to create clear pathways. Keep furniture in the same place.  Firmly attach carpet with non-skid or double-sided tape.  Eliminate uneven floor surfaces.  Select a carpet pattern that does not visually hide the edge of steps.  Be aware of all pets. OTHER HOME SAFETY TIPS  Set the water temperature for 120 F (48.8 C).  Keep emergency numbers on or near the telephone.  Keep smoke detectors on every level of the home and near sleeping  areas. Document Released: 02/20/2002 Document Revised: 09/01/2011 Document Reviewed: 05/22/2011 [facility] Patient Information 2015 [place], [place]. This information is not intended to replace advice given to you by your health care provider. Make sure you discuss any questions you have with your health care provider.   Preventive Care for Adults Ages 24 and over  Blood pressure check.** / Every 1 to 2 years.  Lipid and cholesterol check.**/ Every 5 years beginning at age 46.  Lung cancer screening. / Every year if you are aged 4-80 years and have a 30-pack-year history of smoking and currently smoke or have quit within the past 15 years. Yearly screening is stopped once you have quit smoking for at least 15 years or develop a health problem that would prevent you from having lung cancer treatment.  Fecal occult blood test (FOBT) of stool. / Every year beginning at age 49 and continuing until age 10. You may not have to do this test if you get a colonoscopy every 10 years.  Flexible sigmoidoscopy** or colonoscopy.** / Every 5 years for a flexible sigmoidoscopy or every 10 years for a colonoscopy beginning at age 67 and continuing until age 48.  Hepatitis C blood test.** / For all people born from [birth year] through 1966 and any individual with known risks for hepatitis C.  Abdominal aortic aneurysm (AAA) screening.** / A one-time screening for ages 45 to 36 years who are current or former smokers.  Skin self-exam. / Monthly.  Influenza vaccine. / Every year.  Tetanus, diphtheria, and acellular pertussis (Tdap/Td) vaccine.** / 1 dose of Td every 10 years.  Varicella vaccine.** / Consult your health care provider.  Zoster vaccine.** / 1 dose for adults aged 19 years or older.  Pneumococcal 13-valent conjugate (PCV13) vaccine.** / Consult your health care provider.  Pneumococcal polysaccharide (PPSV23) vaccine.** / 1 dose for all adults aged 26 years and older.  Meningococcal vaccine.** /  Consult your health care provider.  Hepatitis A vaccine.** / Consult your health care provider.  Hepatitis B vaccine.** / Consult your health care provider.  Haemophilus influenzae type b (Hib) vaccine.** / Consult your health care provider. **Family history and personal history of risk and conditions may change your health care provider's recommendations. Document Released: 04/28/2001 Document Revised: 03/07/2013 Document Reviewed: 07/28/2010 [facility] Patient Information 2015 [place], [place]. This information is not intended to replace advice given to you by your health care provider. Make sure you discuss any questions you have with your health care provider.

## 2016-01-13 NOTE — Assessment & Plan Note (Addendum)
Tdap--02/12/2011;  PNA--23/10-2012;  prevnar-- 08-2014;  Shingles--12/2012  Female care  Pap--saw dr Radene Knee ~ 2 months ago, was rx vit d MMG--12-2015 neg Bone Density--02/2011-- T score -1.5 . Recheck DEXA  rec ca and vit D  CCS--01/01/2006-- see  comments from last year, patient does not feel ready to proceed. I agree. Reassess 2018.

## 2016-01-14 NOTE — Assessment & Plan Note (Signed)
Prediabetes: Diet control, check an A1c and micro HTN: Excellent control today, at home diastolic BP on the high side. Recommend no change for now. Hyperlipidemia: Diet control, check FLP Hypothyroidism: Check a TSH Anxiety and insomnia: On Xanax and Paxil, controlled. Autonomic insufficiency: Last time she had a fall was 2 months ago in the context of getting up very quickly, she is avoiding those situations, no change. Tremor, left arm, onset 11-2015. Related to TBI?Marland Kitchen Will reassess on RTC. Insomnia: Likes to stop Xanax, she is taking 0.25, 1/4 of a tab, okay to stop it, keeps for healthy sleep habits discussed. Multiple questions answered. Allergies? Continue with runny nose, described as salty, clear water.sx started after head injury. Refer to ENT, CSF leak? RTC 4 months

## 2016-01-16 MED ORDER — EZETIMIBE 10 MG PO TABS: 10.0000 mg | ORAL_TABLET | Freq: Every day | ORAL | 5 refills | Status: DC

## 2016-01-16 NOTE — Addendum Note (Signed)
Addended byDamita Dunnings D on: 01/16/2016 11:40 AM   Modules accepted: Orders

## 2016-01-28 ENCOUNTER — Other Ambulatory Visit: Payer: Self-pay | Admitting: Cardiology

## 2016-01-28 DIAGNOSIS — R011 Cardiac murmur, unspecified: Secondary | ICD-10-CM

## 2016-01-28 NOTE — Telephone Encounter (Signed)
Okay to refill? 

## 2016-01-28 NOTE — Telephone Encounter (Signed)
yes

## 2016-02-04 ENCOUNTER — Encounter: Payer: Self-pay | Admitting: Internal Medicine

## 2016-02-11 ENCOUNTER — Ambulatory Visit (INDEPENDENT_AMBULATORY_CARE_PROVIDER_SITE_OTHER): Payer: Medicare HMO | Admitting: Internal Medicine

## 2016-02-11 ENCOUNTER — Encounter: Payer: Self-pay | Admitting: Internal Medicine

## 2016-02-11 ENCOUNTER — Ambulatory Visit (HOSPITAL_BASED_OUTPATIENT_CLINIC_OR_DEPARTMENT_OTHER)
Admission: RE | Admit: 2016-02-11 | Discharge: 2016-02-11 | Disposition: A | Discharge status: Home / Self Care | Payer: Medicare HMO | Source: Ambulatory Visit | Attending: Internal Medicine | Admitting: Internal Medicine

## 2016-02-11 VITALS — BP 126/76 | HR 71 | Temp 98.0°F | Resp 14 | Ht 65.0 in | Wt 139.0 lb

## 2016-02-11 DIAGNOSIS — G9389 Other specified disorders of brain: Secondary | ICD-10-CM | POA: Insufficient documentation

## 2016-02-11 DIAGNOSIS — R51 Headache: Secondary | ICD-10-CM

## 2016-02-11 DIAGNOSIS — I739 Peripheral vascular disease, unspecified: Secondary | ICD-10-CM | POA: Insufficient documentation

## 2016-02-11 DIAGNOSIS — R519 Headache, unspecified: Secondary | ICD-10-CM

## 2016-02-11 NOTE — Progress Notes (Signed)
Pre visit review using our clinic review tool, if applicable. No additional management support is needed unless otherwise documented below in the visit note. 

## 2016-02-11 NOTE — Progress Notes (Signed)
Subjective:    Patient ID: Mandy Durham, female    DOB: Jun 15, 1945, 70 y.o.   MRN: [id number]  DOS:  02/11/2016 Type of visit - description : acute Interval history: Her main concern today is headache. She has headaches since her TBI but for the last 2 or 3 days they have been definitely more intense, steady. When asked, she did said it is the worst of her life. Tylenol ibuprofen used to help before but they didn't this time. Topamax didn't help either.  Also, developed left arm tremor 11-2015, now tremor is bilateral. She try primidone, a quarter for tablet, did not help. She has chronic nasal discharge, ENT evaluation to rule out CSF leak pending.   Review of Systems No fever chills. No URI type of symptoms Also  new, is the fact of having nausea and dizziness with a headache.  Denies any recent reinjury. Neck is not stiff, no confusion but she does have memory issues. No rash, photo or phonophobia.   Past Medical History:  Diagnosis Date  . Adenomatous polyp of colon   . Anxiety state, unspecified   . Hyperlipidemia   . Hypertension   . Insomnia   . LBBB (left bundle branch block)    LHC in 2002 showed normal coronaries.   . Osteoarthrosis, unspecified whether generalized or localized, unspecified site   . Overweight(278.02)   . Symptomatic menopausal or female climacteric states   . Syncope and collapse    11/12: Holter 12/12 with rare PACS, HR range 64-140, average 82, no significant arrhythmias. Echo (1/13): EF 50-55%, mild LVH, septal-lateral dyssynchrony c/w LBBB. 3-week event monitor (1/13): No significant arrhythmia.     Past Surgical History:  Procedure Laterality Date  . TUBAL LIGATION      Social History   Social History  . Marital status: Married    Spouse name: N/A  . Number of children: 2  . Years of education: N/A   Occupational History  . retired Education administrator   Social History Main Topics  . Smoking status:  Never Smoker  . Smokeless tobacco: Never Used  . Alcohol use No  . Drug use: No  . Sexual activity: Not on file   Other Topics Concern  . Not on file   Social History Narrative   HSG.  Married '65.  2 daughters, '71, '77; 4 grandchildren                 Medication List       Accurate as of 02/11/16 11:59 PM. Always use your most recent med list.          acetaminophen 500 MG tablet Commonly known as:  TYLENOL Take 500 mg by mouth every 6 (six) hours as needed.   ALPRAZolam 0.25 MG tablet Commonly known as:  XANAX Take 1-2 tablets (0.25-0.5 mg total) by mouth at bedtime as needed for sleep.   azelastine 0.1 % nasal spray Commonly known as:  ASTELIN Place 2 sprays into both nostrils 2 (two) times daily.   ezetimibe 10 MG tablet Commonly known as:  ZETIA Take 1 tablet (10 mg total) by mouth daily.   levothyroxine 25 MCG tablet Commonly known as:  SYNTHROID, LEVOTHROID Take 1 tablet (25 mcg total) by mouth daily before breakfast.   metoprolol tartrate 25 MG tablet Commonly known as:  LOPRESSOR TAKE ONE TABLET BY MOUTH TWICE DAILY   midodrine 2.5 MG tablet Commonly known as:  PROAMATINE Take 1 tablet (2.5  mg total) by mouth 3 (three) times daily with meals.   PARoxetine 20 MG tablet Commonly known as:  PAXIL Take 0.5 tablets (10 mg total) by mouth daily.   primidone 50 MG tablet Commonly known as:  MYSOLINE Take 1 tablet (50 mg total) by mouth daily as needed. For tremors   pyridostigmine 60 MG tablet Commonly known as:  MESTINON TAKE 1 AND 1/2 TABLETS BYMOUTH THREE TIMES A DAY   topiramate 25 MG capsule Commonly known as:  TOPAMAX Take 25 mg by mouth daily as needed.          Objective:   Physical Exam BP 126/76 (BP Location: Left Arm, Patient Position: Sitting, Cuff Size: Small)   Pulse 71   Temp 98 F (36.7 C) (Oral)   Resp 14   Ht 5\' 5"  (1.651 m)   Wt 139 lb (63 kg)   SpO2 98%   BMI 23.13 kg/m  General:   Well developed, well  nourished. NAD.  HEENT:  Normocephalic . Face symmetric, atraumatic. Neck: Full range of motion, supple. Lungs:  CTA B Normal respiratory effort, no intercostal retractions, no accessory muscle use. Heart: RRR,  no murmur.  No pretibial edema bilaterally  Skin: Not pale. Not jaundice Neurologic:  alert & oriented X3.  Speech normal, gait appropriate for age and unassisted. DTRs and motor symmetric. EOMI. + B upper extremity rhythmic movements  distally. Psych--  Cognition and judgment appear intact.  Slightly decreased attention span and concentration.  Behavior appropriate. No anxious or depressed appearing.      Assessment & Plan:   Assessment> Prediabetes  HTN Hyperlipidemia Anxiety, insomnia-- xanax, UDS low risk 07-2015 Menopausal; DEXA 2012 -1.5 @ gyn Recurrent Syncope -- extensive cards eval before, DX with autonomic insufficiency ~ 02-2015 --rx midodrine 06-2015 per cards  LBBB Traumatic SAH (after a syncope),TBI--- admitted 01-2015 , d/c to a rehab unit temporarily -- was rx seroquel (aparently for ICU delirium) -- d/c 07-2015 --rx topamax when at rehab for post [facility]  HAs -- had issues w/b/b incontinence, better as of 07-2015 Tremors LUE, onset [phone]  PLAN 70 year old lady with traumatic SAH and TBI presents with the following: Headache, tremors, chronic nasal discharge HA is severe and above baseline, hand tremors are bilateral and developed in the last few weeks, ENT evaluation to rule out CSF leak is pending.  I'm concern about the increasing headaches, case was d/w neurology, plan is to get a CT head stat, ER if symptoms severe, neurology will see the patient tomorrow at 4:30 PM. I appreciate her help. Will also get a CBC, sedimentation rate. I attempted to collect some fluid from the nose but there was actually no discharge.

## 2016-02-11 NOTE — Patient Instructions (Signed)
GO TO THE LAB : Get the blood work    We are going to get a CT of her head in few moments  Dr. Maureen Chatters from [facility] will see you tomorrow at 4:30. Please be there at 4 pm  If you have increasing headaches, rash, fever, chills, nausea: Go to the ER

## 2016-02-12 ENCOUNTER — Ambulatory Visit (INDEPENDENT_AMBULATORY_CARE_PROVIDER_SITE_OTHER): Payer: Medicare HMO | Admitting: Neurology

## 2016-02-12 ENCOUNTER — Encounter: Payer: Self-pay | Admitting: Neurology

## 2016-02-12 ENCOUNTER — Telehealth: Payer: Self-pay | Admitting: *Deleted

## 2016-02-12 VITALS — BP 120/64 | HR 48 | Resp 20 | Ht 65.5 in | Wt 140.0 lb

## 2016-02-12 DIAGNOSIS — R42 Dizziness and giddiness: Secondary | ICD-10-CM

## 2016-02-12 DIAGNOSIS — R278 Other lack of coordination: Secondary | ICD-10-CM

## 2016-02-12 DIAGNOSIS — G44321 Chronic post-traumatic headache, intractable: Secondary | ICD-10-CM

## 2016-02-12 DIAGNOSIS — H8110 Benign paroxysmal vertigo, unspecified ear: Secondary | ICD-10-CM

## 2016-02-12 DIAGNOSIS — H5509 Other forms of nystagmus: Secondary | ICD-10-CM | POA: Diagnosis not present

## 2016-02-12 DIAGNOSIS — K713 Toxic liver disease with chronic persistent hepatitis: Principal | ICD-10-CM

## 2016-02-12 LAB — CBC WITH DIFFERENTIAL/PLATELET
Basophils Absolute: 0 10*3/uL (ref 0.0–0.1)
Basophils Relative: 0.5 % (ref 0.0–3.0)
EOS PCT: 1.4 % (ref 0.0–5.0)
Eosinophils Absolute: 0.1 10*3/uL (ref 0.0–0.7)
HCT: 40.4 % (ref 36.0–46.0)
Hemoglobin: 13.4 g/dL (ref 12.0–15.0)
LYMPHS ABS: 2.6 10*3/uL (ref 0.7–4.0)
Lymphocytes Relative: 32 % (ref 12.0–46.0)
MCHC: 33.2 g/dL (ref 30.0–36.0)
MCV: 84.4 fl (ref 78.0–100.0)
MONO ABS: 0.5 10*3/uL (ref 0.1–1.0)
MONOS PCT: 6 % (ref 3.0–12.0)
NEUTROS ABS: 4.8 10*3/uL (ref 1.4–7.7)
NEUTROS PCT: 60.1 % (ref 43.0–77.0)
PLATELETS: 311 10*3/uL (ref 150.0–400.0)
RBC: 4.78 Mil/uL (ref 3.87–5.11)
RDW: 14.1 % (ref 11.5–15.5)
WBC: 8 10*3/uL (ref 4.0–10.5)

## 2016-02-12 LAB — SEDIMENTATION RATE: SED RATE: 1 mm/h (ref 0–30)

## 2016-02-12 MED ORDER — IBUPROFEN 200 MG PO TABS: 200.0000 mg | ORAL_TABLET | Freq: Four times a day (QID) | ORAL | 0 refills | Status: DC | PRN

## 2016-02-12 NOTE — Addendum Note (Signed)
Addended by: Larey Seat on: 02/12/2016 05:20 PM   Modules accepted: Orders

## 2016-02-12 NOTE — Assessment & Plan Note (Signed)
70 year old lady with traumatic SAH and TBI presents with the following: Headache, tremors, chronic nasal discharge HA is severe and above baseline, hand tremors are bilateral and developed in the last few weeks, ENT evaluation to rule out CSF leak is pending.  I'm concern about the increasing headaches, case was d/w neurology, plan is to get a CT head stat, ER if symptoms severe, neurology will see the patient tomorrow at 4:30 PM. I appreciate her help. Will also get a CBC, sedimentation rate. I attempted to collect some fluid from the nose but there was actually no discharge.

## 2016-02-12 NOTE — Patient Instructions (Signed)
Compensation Strategies for Tremors  When eating, try the following Eat out of bowls, divided plates, or use a plate guard (available at a medical supply store) and eat with a spoon so that you have an edge to scoop up food. Try raising your plate so that there is less distance between the plate and mouth.Try stabilizing elbows on the tables or against your body. Use utensil with built-up/larger grips as they are easier to hold.  When writing, try the following: Stabilize forearm on the table. Take your time as rushing/being stressed can increase tremors. Try a felt-tipped pen, it does not glide as much.  Avoid gel pens ( they move to much ). Consider using pre-printed labels with your name and address (carry them with you when you go out) or you can get stamps with your address or signature on it. Use a small tape recorder to record messages/reminders for yourself. Use pens with bigger grips.  When brushing your teeth, putting on make-up, or styling hair, try the following: Use an electric toothbrush. Use items with built-up grips. Stabilize your elbows against your body or on the counter. Use long-handled brushes/combs. Use a hair dryer with a stand.  In general: Avoid stress, fatigue or rushing as this can increase tremors. Sit down for activities that require more control/coordination. Perform "flicks".  

## 2016-02-12 NOTE — Progress Notes (Signed)
[facility]   Provider:  Larey Seat, M D  Referring Provider: Colon Branch, MD Primary Care Physician:  Kathlene November, MD  Chief Complaint  Patient presents with  . New Patient (Initial Visit)    headache, shaking, and tremors    HPI:  Mandy Durham is a 70 y.o. female , seen here as a referral  from Dr. Kathlene November for a subarachnoid traumatic bleed , sequelae evaluation.   Mandy Durham had originally asked for this appointment due to incontinence following a SAH. She had fainted, fell and aquiered the TBI with SAH .  Unrelated to the fall she had noted foul smells of gas, and her husband commented on it, she lost temporary control of bowel and bladder function.  Mandy. Durham also states that her own olfactory sense has been impaired.  She used to be a patient of the now retired physician Dr. Adella Hare and the loss of smell had been noted more than 10 years ago. She fell in 2005 and fractured her nose. Her olfactory sense was damaged. She states that the incontinence  is improved. She still has some urine leakage and soft stools. She wonders if this is still and neurologic impairment from the [facility]. She  has no pain with bowel movements, currently no headaches or nausea. No insomnia anxiety symptoms are controlled with Xanax. She reports no lower extremity numbness or weakness foot drop or shuffling gait she is able to master stairs and to turn without getting dizzy. She has a history of syncope and collapse a Holter monitor in December 2012 revealed rare PACs and echo in January 2013 an EF of 55% with mild left ventricular hypertrophy left bundle branch block she had no significant arrhythmia on a 3 week event monitor or normal coronary artery function no chest pain. She remains on Mestinon to protect her from fainting spells.  Sleep habits are as follows:  She has an established bed time at 10 Pm and rises at 7.30, feels refreshed in the morning. Shares the bedroom with her husband.    Social history: the patient has been married for 52 years, has never used tobacco products and never ETOH, she drinks iced teas and soda, but not daily, 2 a week.  Retired Optometrist.     Interval history from 02/12/2016. I have originally seen Mandy. Durham for one visit in February of 2016,  She has continued to have difficulties since suffering a sub-arachnoid traumatic hemorrhage. She states that ever since she had the fall she has sometimes leaked nasal clear fluid and this discharge had increased over the last couple of month and weeks. She also has more headaches. Her primary care physician, Dr. past, called me yesterday and asked if I could see her on an urgent basis. There is another very evident development : she has a significant tremor in both upper extremities and she sits here with her legs crossed and holds her left hand with her right to suppress the amplitude of movement. The subarachnoid hemorrhage affected the right brain, her trauma begun in the left hand over the last 3 months it has exacerbated to involve both upper extremities and it has a flapping and sometimes pill-rolling movement characteristic. There was no rigor over her biceps and her last visit. Her headaches are located right between the eyes above the root of the nose and she feels dizzy and lightheaded. The dizziness is a new phenomenon, the headaches preceded the dizziness. Some of her  headaches are associated with nausea, the headaches do not radiate to other parts of the face or skull, she does not have visual changes with headaches. She does not have photophobia. She does not wake up with these headaches.    Review of Systems: Out of a complete 14 system review, the patient complains of only the following symptoms, and all other reviewed systems are negative.  TREMOR, Headaches, nausea, lightheadedness. Not vertigo, clear nasal discharge.    Social History   Social History  . Marital status: Married    Spouse  name: N/A  . Number of children: 2  . Years of education: N/A   Occupational History  . retired Education administrator   Social History Main Topics  . Smoking status: Never Smoker  . Smokeless tobacco: Never Used  . Alcohol use No  . Drug use: No  . Sexual activity: Not on file   Other Topics Concern  . Not on file   Social History Narrative   HSG.  Married '65.  2 daughters, '71, '77; 4 grandchildren             Family History  Problem Relation Age of Onset  . Heart failure Mother   . Coronary artery disease Mother   . Dementia Mother   . Diabetes Mother   . Colon cancer Neg Hx   . Breast cancer Neg Hx   . Hypertension Neg Hx   . Heart disease Neg Hx   . Heart attack Neg Hx   . Stroke Neg Hx     Past Medical History:  Diagnosis Date  . Adenomatous polyp of colon   . Anxiety state, unspecified   . Hyperlipidemia   . Hypertension   . Insomnia   . LBBB (left bundle branch block)    LHC in 2002 showed normal coronaries.   . Osteoarthrosis, unspecified whether generalized or localized, unspecified site   . Overweight(278.02)   . Symptomatic menopausal or female climacteric states   . Syncope and collapse    11/12: Holter 12/12 with rare PACS, HR range 64-140, average 82, no significant arrhythmias. Echo (1/13): EF 50-55%, mild LVH, septal-lateral dyssynchrony c/w LBBB. 3-week event monitor (1/13): No significant arrhythmia.     Past Surgical History:  Procedure Laterality Date  . TUBAL LIGATION      Current Outpatient Prescriptions  Medication Sig Dispense Refill  . acetaminophen (TYLENOL) 500 MG tablet Take 500 mg by mouth every 6 (six) hours as needed.    . ALPRAZolam (XANAX) 0.25 MG tablet Take 1-2 tablets (0.25-0.5 mg total) by mouth at bedtime as needed for sleep. 60 tablet 0  . azelastine (ASTELIN) 0.1 % nasal spray Place 2 sprays into both nostrils 2 (two) times daily. 30 mL 6  . ezetimibe (ZETIA) 10 MG tablet Take 1 tablet  (10 mg total) by mouth daily. 30 tablet 5  . levothyroxine (SYNTHROID, LEVOTHROID) 25 MCG tablet Take 1 tablet (25 mcg total) by mouth daily before breakfast. 30 tablet 3  . metoprolol tartrate (LOPRESSOR) 25 MG tablet TAKE ONE TABLET BY MOUTH TWICE DAILY 180 tablet 2  . midodrine (PROAMATINE) 2.5 MG tablet Take 1 tablet (2.5 mg total) by mouth 3 (three) times daily with meals. 90 tablet 5  . PARoxetine (PAXIL) 20 MG tablet Take 0.5 tablets (10 mg total) by mouth daily. 30 tablet 5  . primidone (MYSOLINE) 50 MG tablet Take 1 tablet (50 mg total) by mouth daily as needed. For tremors (Patient taking  differently: Take 25 mg by mouth daily as needed. For tremors) 30 tablet 2  . pyridostigmine (MESTINON) 60 MG tablet TAKE 1 AND 1/2 TABLETS BYMOUTH THREE TIMES A DAY 405 tablet 1  . topiramate (TOPAMAX) 25 MG capsule Take 25 mg by mouth daily as needed.     No current facility-administered medications for this visit.     Allergies as of 02/12/2016  . (No Known Allergies)    Vitals: There were no vitals taken for this visit. Last Weight:  Wt Readings from Last 1 Encounters:  02/11/16 139 lb (63 kg)   [id number] is no height or weight on file to calculate BMI.     Last Height:   Ht Readings from Last 1 Encounters:  02/11/16 5\' 5"  (1.651 m)    Physical exam:  General: The patient is awake, alert and appears not in acute distress. The patient is well groomed. Head: Normocephalic, atraumatic.  High grade Retrognathia is seen.  Cardiovascular:  Regular rate and rhythm with extra beats, without  murmurs or carotid bruit, and without distended neck veins. Respiratory: Lungs are clear to auscultation. Skin:  Without evidence of edema, or rash Trunk: BMI is normal  The patient's posture is erect  Neurologic exam : The patient is awake and alert, oriented to place and time.   Memory subjective described as intact.  Attention span & concentration ability appears normal.  Speech is  Stuttering ,  non fluent,  without dysarthria, dysphonia .  Mood and affect are appropriate.  Cranial nerves: Pupils are equal and briskly reactive to light.  Funduscopic exam without evidence of pallor or edema. Extraocular movements  ; there is a 3 beat nystagmus to the left, this is also noticed with upward gaze but not to the left or downward.  . Visual fields by finger perimetry are intact. Hearing to finger rub intact.   Facial sensation intact to fine touch.  Facial motor strength is symmetric and tongue and uvula move midline. Shoulder shrug was symmetrical.   Motor exam:   There is a mildly elevated tone over the left biceps noted and arm flexion and extension at the elbow show some resistance. Movement in the right upper extremity  Smooth without cogwheeling, there is a slight increase in tone over her left biceps but there is no cogwheeling over her right. the patient has equal muscle bulk and symmetric range of motion. She was able to provide good and strong grip strength bilaterally. She is right-hand dominant.  Sensory:  Fine touch, pinprick and vibration, Proprioception normal.  Coordination: Rapid alternating movements in the fingers/hands was normal. Finger-to-nose maneuver  ; patient has significant tremor in both hands but appears parkinsonian.  at resit a pill rolling tremor is noticed. but it is not associated with muscle rigidity.  Gait and station: Patient walks without assistive device and is able unassisted to climb up to the exam table. Strength within normal limits.  Stance is stable and normal. Turns with  3 Steps. Romberg testing is negative.  Deep tendon reflexes: in the  upper and lower extremities are symmetric and intact. Babinski maneuver response is up-going on both sides. .  The patient was advised of the nature of the diagnosed  disorder , the treatment options and risks for general a health and wellness arising from not treating the condition.  I spent more than 40  minutes of face to face time with the patient. Greater than 50% of time was spent in counseling and coordination  of care. We have discussed the diagnosis and differential and I answered the patient's questions.     Assessment:  After physical and neurologic examination, review of laboratory studies,  Personal review of imaging studies, reports of other /same  Imaging studies ,  Results of hospital evaluation, labs, and images, neurophysiology testing and pre-existing records as far as provided in visit., my assessment is   1) I'm glad to see that Mandy Durham has recovered greatly from the initial degree of disability following her subarachnoid hemorrhage.   For a while she did not longer suffer from headaches, vertigo, dizziness, tinnitus and her bladder incontinence had been improved. Over the last 2-4 month she now has again developed the symptoms headaches that are located in the center of the forehead, do not radiate. A new complaint of diplopia, double vision was made, but is not present today. She experiences again dizziness and lightheadedness. She now has a significant tremor at rest. This is a rather coarse tremor. She also noticed nasal discharge of clear fluid that is suspected to represent possible brain fluid. She has an appointment with an ear nose and throat specialist in 14 days.  Dr. Larose Kells had arranged for a CT scan of the skull yesterday.  CLINICAL DATA:  70 year old female with headaches and tremors for 5 days. No recent injury. Personal history of right frontal hemorrhagic contusion from closed head injury. Initial encounter.  EXAM: CT HEAD WITHOUT CONTRAST  TECHNIQUE: Contiguous axial images were obtained from the base of the skull through the vertex without intravenous contrast.  COMPARISON:  Head CTs without contrast 03/06/2015 and earlier.  FINDINGS: Brain: Chronic encephalomalacia along the lateral inferior right frontal gyrus tracking to the anterior operculum  corresponding to evolution of the 2016 closed head injury. Cerebral volume elsewhere appears stable. Mild chronic white matter hypodensity including in the anterior limb of the left internal capsule appears stable. No midline shift, ventriculomegaly, mass effect, evidence of mass lesion, intracranial hemorrhage or evidence of cortically based acute infarction.  Vascular: No suspicious intracranial vascular hyperdensity.  Skull: Stable and intact.  No acute osseous abnormality identified.  Sinuses/Orbits: Visualized paranasal sinuses and mastoids are stable and well pneumatized.  Other: Visualized orbits and scalp soft tissues are within normal limits.  IMPRESSION: 1. Expected evolution of the posttraumatic right frontotemporal encephalomalacia since 2016. 2.  No acute intracranial abnormality. 3. Mild for age chronic white matter changes most commonly due to chronic small vessel disease.   Electronically Signed   By: Genevie Ann M.D.   On: 02/11/2016 15:50   Plan:  Treatment plan and additional workup :  I would like for the patient to see the ENT physician ASAP to answer the question about possible brain fluid leakage. I will begin treating her as if she has essential tremor and see how she will respond to the medication. There is no evidence of cerebellar injury, but the imaging study was a CT. If she does not respond to Mysoline I would feel compelled to order an MRI of the brain. I will also refer her for occupational therapy and therapy. Mandy Durham already takes a beta blocker and  a benzodiazepine as well as Mysoline,  she is also on topiramate -all these medications should suppress tremor. The patient and her husband report that she already tried Mysoline prescribed by Dr. Naaman Plummer, her physical rehabilitation doctor. She was very hypersonic excessively sleepy after her first dose and didn't try a second one. My goal would be for her  to try it at night for 14 days before  going to daytime that her body can get used to the medication. I would like for the patient to take a half tablet at bedtime. She is likely somewhat sedated for 8 hours following the dose .  Do not take ALPRAZOLAM  While on mysoline.   I will order an MRI of the brain. I asked the patient to take a half tablet of Mysoline at night, I have reviewed her lab results recent TSH, CBC were normal, she does have significant hyperlipidemia, her liver function tests and kidney function were normal as of August 2017. Since I do not intend to give contrast with the MRI I will not have to repeat a metabolic panel. I spoke to Dr. Nevada Crane at radiology who recommended an MRI of the brain without contrast with special attention to the cerebellum.         Asencion Partridge Blaine Hari MD  02/12/2016   CC: [facility], [address], [place] [id number]

## 2016-02-13 NOTE — Telephone Encounter (Signed)
I called pt to discuss. No answer, left a message asking her to call me back. 

## 2016-02-13 NOTE — Telephone Encounter (Signed)
I spoke to pt and offered her a sooner appt of 03/23/16 at 11:30 and cancelled her appt on 04/08/16. Pt verbalized understanding.

## 2016-02-17 ENCOUNTER — Telehealth: Payer: Self-pay | Admitting: Neurology

## 2016-02-17 DIAGNOSIS — S069X9A Unspecified intracranial injury with loss of consciousness of unspecified duration, initial encounter: Secondary | ICD-10-CM

## 2016-02-17 DIAGNOSIS — S069XAA Unspecified intracranial injury with loss of consciousness status unknown, initial encounter: Secondary | ICD-10-CM

## 2016-02-17 DIAGNOSIS — R42 Dizziness and giddiness: Secondary | ICD-10-CM

## 2016-02-17 DIAGNOSIS — H04209 Unspecified epiphora, unspecified lacrimal gland: Secondary | ICD-10-CM

## 2016-02-17 DIAGNOSIS — R270 Ataxia, unspecified: Secondary | ICD-10-CM

## 2016-02-17 DIAGNOSIS — G252 Other specified forms of tremor: Secondary | ICD-10-CM

## 2016-02-17 DIAGNOSIS — J3489 Other specified disorders of nose and nasal sinuses: Secondary | ICD-10-CM

## 2016-02-17 DIAGNOSIS — S069X1D Unspecified intracranial injury with loss of consciousness of 30 minutes or less, subsequent encounter: Secondary | ICD-10-CM

## 2016-02-17 NOTE — Telephone Encounter (Signed)
Patient states that she has only taken 1/2 tab Mysoline for 2 nights and both times it made her feel weak, dizzy, nauseous, and fatigued. She has not taken anymore due to side effects. How should we proceed?

## 2016-02-17 NOTE — Telephone Encounter (Signed)
Patient is calling stating since taking medication primidone (MYSOLINE) 50 MG tablet since 11-29 it has made her dizzy and very nauseated. Please call and discuss

## 2016-02-18 MED ORDER — ALPRAZOLAM ER 0.5 MG PO TB24: 0.5000 mg | ORAL_TABLET | Freq: Every day | ORAL | 1 refills | Status: DC

## 2016-02-18 MED ORDER — CARBIDOPA-LEVODOPA 25-100 MG PO TABS: 1.0000 | ORAL_TABLET | Freq: Three times a day (TID) | ORAL | 2 refills | Status: DC

## 2016-02-18 NOTE — Telephone Encounter (Signed)
Called patient, asked her to stop mysoline and we will try a Sinemet 25/100 mg , asked to take it one hour prior to meals. She is not to take xanax on days of vestibular rehab. Cd. Arranged for pt vestibular rehab, and for xanax prn use.

## 2016-02-18 NOTE — Addendum Note (Signed)
Addended by: Larey Seat on: 02/18/2016 11:48 AM   Modules accepted: Orders

## 2016-02-18 NOTE — Telephone Encounter (Signed)
I spoke to patient and confirmed which pharmacy to fax xanax to. I am faxing now.

## 2016-02-19 ENCOUNTER — Encounter: Payer: Self-pay | Admitting: Neurology

## 2016-02-19 MED ORDER — ONDANSETRON HCL 4 MG PO TABS: 4.0000 mg | ORAL_TABLET | Freq: Three times a day (TID) | ORAL | 0 refills | Status: DC | PRN

## 2016-02-19 NOTE — Telephone Encounter (Signed)
   Dear Beverlee Nims, Mrs. Grauer is supposed to try SINEMET, 25/100 mg 3 times daily to see if it affects her tremors positively. She still has a lot of vertigo and nausea and I wrote for Zofran 4 mg. She has also an MRI ordered, and I have not heard when it will take place. Could you check on her MRI/  presumed date of study?

## 2016-02-20 ENCOUNTER — Telehealth: Payer: Self-pay | Admitting: Internal Medicine

## 2016-02-20 ENCOUNTER — Other Ambulatory Visit: Payer: Self-pay | Admitting: Internal Medicine

## 2016-02-20 ENCOUNTER — Inpatient Hospital Stay (HOSPITAL_COMMUNITY)
Admission: EM | Admit: 2016-02-20 | Discharge: 2016-02-22 | DRG: 244 | Disposition: A | Discharge status: Home / Self Care | Payer: Medicare HMO | Attending: Cardiology | Admitting: Cardiology

## 2016-02-20 ENCOUNTER — Encounter (HOSPITAL_COMMUNITY): Payer: Self-pay | Admitting: Emergency Medicine

## 2016-02-20 DIAGNOSIS — Z8249 Family history of ischemic heart disease and other diseases of the circulatory system: Secondary | ICD-10-CM | POA: Diagnosis not present

## 2016-02-20 DIAGNOSIS — Z6824 Body mass index (BMI) 24.0-24.9, adult: Secondary | ICD-10-CM | POA: Diagnosis not present

## 2016-02-20 DIAGNOSIS — Z833 Family history of diabetes mellitus: Secondary | ICD-10-CM | POA: Diagnosis not present

## 2016-02-20 DIAGNOSIS — R55 Syncope and collapse: Secondary | ICD-10-CM | POA: Diagnosis present

## 2016-02-20 DIAGNOSIS — Z8781 Personal history of (healed) traumatic fracture: Secondary | ICD-10-CM

## 2016-02-20 DIAGNOSIS — R7303 Prediabetes: Secondary | ICD-10-CM | POA: Diagnosis present

## 2016-02-20 DIAGNOSIS — I10 Essential (primary) hypertension: Secondary | ICD-10-CM | POA: Diagnosis present

## 2016-02-20 DIAGNOSIS — Z86010 Personal history of colonic polyps: Secondary | ICD-10-CM

## 2016-02-20 DIAGNOSIS — F329 Major depressive disorder, single episode, unspecified: Secondary | ICD-10-CM | POA: Diagnosis present

## 2016-02-20 DIAGNOSIS — I951 Orthostatic hypotension: Secondary | ICD-10-CM | POA: Diagnosis present

## 2016-02-20 DIAGNOSIS — H5509 Other forms of nystagmus: Secondary | ICD-10-CM | POA: Diagnosis present

## 2016-02-20 DIAGNOSIS — G252 Other specified forms of tremor: Secondary | ICD-10-CM | POA: Diagnosis present

## 2016-02-20 DIAGNOSIS — I441 Atrioventricular block, second degree: Principal | ICD-10-CM | POA: Diagnosis present

## 2016-02-20 DIAGNOSIS — M19019 Primary osteoarthritis, unspecified shoulder: Secondary | ICD-10-CM | POA: Diagnosis present

## 2016-02-20 DIAGNOSIS — Z818 Family history of other mental and behavioral disorders: Secondary | ICD-10-CM

## 2016-02-20 DIAGNOSIS — E778 Other disorders of glycoprotein metabolism: Secondary | ICD-10-CM | POA: Diagnosis present

## 2016-02-20 DIAGNOSIS — Z950 Presence of cardiac pacemaker: Secondary | ICD-10-CM | POA: Diagnosis present

## 2016-02-20 DIAGNOSIS — R001 Bradycardia, unspecified: Secondary | ICD-10-CM | POA: Diagnosis present

## 2016-02-20 DIAGNOSIS — G4700 Insomnia, unspecified: Secondary | ICD-10-CM | POA: Diagnosis present

## 2016-02-20 DIAGNOSIS — F411 Generalized anxiety disorder: Secondary | ICD-10-CM | POA: Diagnosis present

## 2016-02-20 DIAGNOSIS — E785 Hyperlipidemia, unspecified: Secondary | ICD-10-CM | POA: Diagnosis present

## 2016-02-20 DIAGNOSIS — R531 Weakness: Secondary | ICD-10-CM

## 2016-02-20 DIAGNOSIS — E663 Overweight: Secondary | ICD-10-CM | POA: Diagnosis present

## 2016-02-20 DIAGNOSIS — M8580 Other specified disorders of bone density and structure, unspecified site: Secondary | ICD-10-CM | POA: Diagnosis present

## 2016-02-20 DIAGNOSIS — I447 Left bundle-branch block, unspecified: Secondary | ICD-10-CM | POA: Diagnosis present

## 2016-02-20 DIAGNOSIS — Z8679 Personal history of other diseases of the circulatory system: Secondary | ICD-10-CM | POA: Diagnosis not present

## 2016-02-20 DIAGNOSIS — Z95818 Presence of other cardiac implants and grafts: Secondary | ICD-10-CM

## 2016-02-20 HISTORY — DX: Presence of cardiac pacemaker: Z95.0

## 2016-02-20 LAB — COMPREHENSIVE METABOLIC PANEL
ALBUMIN: 3.6 g/dL (ref 3.5–5.0)
ALK PHOS: 136 U/L — AB (ref 38–126)
ALT: 68 U/L — ABNORMAL HIGH (ref 14–54)
ANION GAP: 6 (ref 5–15)
AST: 48 U/L — ABNORMAL HIGH (ref 15–41)
BILIRUBIN TOTAL: 1 mg/dL (ref 0.3–1.2)
BUN: 20 mg/dL (ref 6–20)
CALCIUM: 8.9 mg/dL (ref 8.9–10.3)
CO2: 25 mmol/L (ref 22–32)
Chloride: 108 mmol/L (ref 101–111)
Creatinine, Ser: 0.89 mg/dL (ref 0.44–1.00)
Glucose, Bld: 96 mg/dL (ref 65–99)
POTASSIUM: 4 mmol/L (ref 3.5–5.1)
Sodium: 139 mmol/L (ref 135–145)
TOTAL PROTEIN: 6.6 g/dL (ref 6.5–8.1)

## 2016-02-20 LAB — PHOSPHORUS: PHOSPHORUS: 4 mg/dL (ref 2.5–4.6)

## 2016-02-20 LAB — CBC
HEMATOCRIT: 40.1 % (ref 36.0–46.0)
HEMOGLOBIN: 13.3 g/dL (ref 12.0–15.0)
MCH: 28.1 pg (ref 26.0–34.0)
MCHC: 33.2 g/dL (ref 30.0–36.0)
MCV: 84.6 fL (ref 78.0–100.0)
Platelets: 264 10*3/uL (ref 150–400)
RBC: 4.74 MIL/uL (ref 3.87–5.11)
RDW: 14 % (ref 11.5–15.5)
WBC: 8.5 10*3/uL (ref 4.0–10.5)

## 2016-02-20 LAB — MAGNESIUM: MAGNESIUM: 2.1 mg/dL (ref 1.7–2.4)

## 2016-02-20 LAB — MRSA PCR SCREENING: MRSA BY PCR: NEGATIVE

## 2016-02-20 LAB — TSH: TSH: 2.84 u[IU]/mL (ref 0.350–4.500)

## 2016-02-20 LAB — TROPONIN I

## 2016-02-20 MED ORDER — SODIUM CHLORIDE 0.9 % IV SOLN
1000.0000 mL | INTRAVENOUS | Status: DC
  Administered 2016-02-20 – 2016-02-21 (×2): 1000 mL via INTRAVENOUS

## 2016-02-20 MED ORDER — EZETIMIBE 10 MG PO TABS
10.0000 mg | ORAL_TABLET | Freq: Every day | ORAL | Status: DC
  Administered 2016-02-21 – 2016-02-22 (×2): 10 mg via ORAL
  Filled 2016-02-20 (×2): qty 1

## 2016-02-20 MED ORDER — PYRIDOSTIGMINE BROMIDE 60 MG PO TABS
60.0000 mg | ORAL_TABLET | Freq: Three times a day (TID) | ORAL | Status: DC
  Administered 2016-02-20 – 2016-02-22 (×4): 60 mg via ORAL
  Filled 2016-02-20 (×6): qty 1

## 2016-02-20 MED ORDER — LEVOTHYROXINE SODIUM 25 MCG PO TABS
25.0000 ug | ORAL_TABLET | Freq: Every day | ORAL | Status: DC
  Administered 2016-02-21 – 2016-02-22 (×2): 25 ug via ORAL
  Filled 2016-02-20 (×2): qty 1

## 2016-02-20 MED ORDER — SODIUM CHLORIDE 0.9 % IV SOLN
1000.0000 mL | Freq: Once | INTRAVENOUS | Status: AC
  Administered 2016-02-20: 1000 mL via INTRAVENOUS

## 2016-02-20 MED ORDER — ACETAMINOPHEN 325 MG PO TABS: 650.0000 mg | ORAL_TABLET | ORAL | Status: DC | PRN

## 2016-02-20 MED ORDER — SODIUM CHLORIDE 0.9 % IV SOLN
1.0000 g | Freq: Once | INTRAVENOUS | Status: AC
  Administered 2016-02-20: 1 g via INTRAVENOUS
  Filled 2016-02-20: qty 10

## 2016-02-20 MED ORDER — ALPRAZOLAM 0.25 MG PO TABS
0.2500 mg | ORAL_TABLET | Freq: Every evening | ORAL | Status: DC | PRN
  Administered 2016-02-20 – 2016-02-21 (×2): 0.25 mg via ORAL
  Filled 2016-02-20 (×2): qty 1

## 2016-02-20 MED ORDER — ALPRAZOLAM ER 0.5 MG PO TB24: 0.5000 mg | ORAL_TABLET | Freq: Every day | ORAL | Status: DC

## 2016-02-20 MED ORDER — CARBIDOPA-LEVODOPA 25-100 MG PO TABS
1.0000 | ORAL_TABLET | Freq: Three times a day (TID) | ORAL | Status: DC
  Administered 2016-02-21 – 2016-02-22 (×3): 1 via ORAL
  Filled 2016-02-20 (×6): qty 1

## 2016-02-20 MED ORDER — HYDRALAZINE HCL 20 MG/ML IJ SOLN
10.0000 mg | Freq: Three times a day (TID) | INTRAMUSCULAR | Status: DC | PRN
  Administered 2016-02-20 – 2016-02-21 (×4): 10 mg via INTRAVENOUS
  Filled 2016-02-20 (×4): qty 1

## 2016-02-20 MED ORDER — HEPARIN SODIUM (PORCINE) 5000 UNIT/ML IJ SOLN
5000.0000 [IU] | Freq: Three times a day (TID) | INTRAMUSCULAR | Status: DC
  Administered 2016-02-20 – 2016-02-21 (×3): 5000 [IU] via SUBCUTANEOUS
  Filled 2016-02-20 (×4): qty 1

## 2016-02-20 MED ORDER — ONDANSETRON HCL 4 MG/2ML IJ SOLN
4.0000 mg | Freq: Four times a day (QID) | INTRAMUSCULAR | Status: DC | PRN
  Administered 2016-02-21 – 2016-02-22 (×2): 4 mg via INTRAVENOUS
  Filled 2016-02-20 (×4): qty 2

## 2016-02-20 MED ORDER — PAROXETINE HCL 20 MG PO TABS
10.0000 mg | ORAL_TABLET | Freq: Every day | ORAL | Status: DC
  Administered 2016-02-20 – 2016-02-22 (×3): 10 mg via ORAL
  Filled 2016-02-20 (×3): qty 1

## 2016-02-20 NOTE — Telephone Encounter (Signed)
Pt seen last week w/ tremors, headaches after TBI. Can you call to triage? Thank you.

## 2016-02-20 NOTE — Telephone Encounter (Signed)
Pt is requesting refill on Alprazolam.  Last OV: 02/11/2016 Last Fill: 02/18/2016 #90 and 1RF UDS: 07/22/2015 Low risk  Please advise.

## 2016-02-20 NOTE — ED Notes (Signed)
Cardiology NP at bedside.

## 2016-02-20 NOTE — Telephone Encounter (Signed)
Per pt's chart, pt is currently in the ER.

## 2016-02-20 NOTE — ED Triage Notes (Signed)
Pt here from home with c/o dizziness , pt was found to be hypertensive and brady ON THE 30'S

## 2016-02-20 NOTE — ED Notes (Signed)
Pt heart rate remains 26 , Cardiology called , orders for ICU  Received

## 2016-02-20 NOTE — ED Notes (Signed)
Pt placed on zoll monitor pads and leads due to bradycardia. Dr. Venora Maples made aware, at bedside

## 2016-02-20 NOTE — Telephone Encounter (Signed)
Patient called back stating that she never was connected with Dr. Ethel Rana nurse. I let her know that it was not Dr. Ethel Rana nurse that I was transferring her to but a triage nurse. She understood. Transferred to [facility]

## 2016-02-20 NOTE — H&P (Signed)
History & Physical    Patient ID: Mandy Durham MRN: [id number], DOB/AGE: October 14, 1945   Admit date: 02/20/2016   Primary Physician: Kathlene November, MD Primary Cardiologist: Dr. Aundra Dubin  Patient Profile    70 yo female with PMH of HTN, HLD, LBBB, and syncope who presented to the ED with c/o weakness and lightheadedness.   Past Medical History    Past Medical History:  Diagnosis Date  . Adenomatous polyp of colon   . Anxiety state, unspecified   . Hyperlipidemia   . Hypertension   . Insomnia   . LBBB (left bundle branch block)    LHC in 2002 showed normal coronaries.   . Osteoarthrosis, unspecified whether generalized or localized, unspecified site   . Overweight(278.02)   . Symptomatic menopausal or female climacteric states   . Syncope and collapse    11/12: Holter 12/12 with rare PACS, HR range 64-140, average 82, no significant arrhythmias. Echo (1/13): EF 50-55%, mild LVH, septal-lateral dyssynchrony c/w LBBB. 3-week event monitor (1/13): No significant arrhythmia.     Past Surgical History:  Procedure Laterality Date  . TUBAL LIGATION       Allergies  No Known Allergies  History of Present Illness    Mandy Durham is a 70 yo female with PMH of HTN, HLD, LBBB, and syncope. She has been followed by Dr. Aundra Dubin for her LBBB and syncope. Had a normal cardiac cath in 2002. Last visit was 8/17 where she reported ongoing, always with standing. Did wear an event monitor that showed no arrhythmias. She was continued on pyridostigmine and midodrine, along with her abd binder and compression stockings.   Reports she has not been feeling well since thanksgiving. Decreased energy, weakness and light-headedness. States this became worse last night when she went to lay down for bed. Also has had some intermittent chest discomfort at time, not associated with rest or exertion.   Presented to the ED today and EKG showed Mobitz type II with a rate of 32. Labs were stable. Last dose of  metoprolol was this morning at 10am.   Home Medications    Prior to Admission medications   Medication Sig Start Date End Date Taking? Authorizing Provider  acetaminophen (TYLENOL) 500 MG tablet Take 500 mg by mouth every 6 (six) hours as needed.   Yes Historical Provider, MD  ALPRAZolam (XANAX XR) 0.5 MG 24 hr tablet Take 1 tablet (0.5 mg total) by mouth daily. 02/18/16  Yes Carmen Dohmeier, MD  levothyroxine (SYNTHROID, LEVOTHROID) 25 MCG tablet Take 1 tablet (25 mcg total) by mouth daily before breakfast. 12/24/15  Yes Meredith Staggers, MD  metoprolol tartrate (LOPRESSOR) 25 MG tablet TAKE ONE TABLET BY MOUTH TWICE DAILY 01/28/16  Yes Larey Dresser, MD  midodrine (PROAMATINE) 2.5 MG tablet Take 1 tablet (2.5 mg total) by mouth 3 (three) times daily with meals. 08/23/15  Yes Larey Dresser, MD  PARoxetine (PAXIL) 20 MG tablet Take 0.5 tablets (10 mg total) by mouth daily. 12/24/15  Yes Meredith Staggers, MD  pyridostigmine (MESTINON) 60 MG tablet TAKE 1 AND 1/2 TABLETS BYMOUTH THREE TIMES A DAY 01/28/16  Yes Larey Dresser, MD  azelastine (ASTELIN) 0.1 % nasal spray Place 2 sprays into both nostrils 2 (two) times daily. Patient not taking: Reported on 02/20/2016 07/22/15   Colon Branch, MD  carbidopa-levodopa (SINEMET IR) 25-100 MG tablet Take 1 tablet by mouth 3 (three) times daily. 02/18/16   Asencion Partridge Dohmeier, MD  ezetimibe (ZETIA) 10  MG tablet Take 1 tablet (10 mg total) by mouth daily. 01/16/16   Colon Branch, MD  ibuprofen (MOTRIN IB) 200 MG tablet Take 1 tablet (200 mg total) by mouth every 6 (six) hours as needed. Patient not taking: Reported on 02/20/2016 02/12/16   Larey Seat, MD  ondansetron (ZOFRAN) 4 MG tablet Take 1 tablet (4 mg total) by mouth every 8 (eight) hours as needed for nausea or vomiting. Patient not taking: Reported on 02/20/2016 02/19/16   Larey Seat, MD  primidone (MYSOLINE) 50 MG tablet Take 1 tablet (50 mg total) by mouth daily as needed. For tremors Patient not  taking: Reported on 02/20/2016 01/10/16   Meredith Staggers, MD  topiramate (TOPAMAX) 25 MG capsule Take 25 mg by mouth daily as needed.    Historical Provider, MD    Family History    Family History  Problem Relation Age of Onset  . Heart failure Mother   . Coronary artery disease Mother   . Dementia Mother   . Diabetes Mother   . Colon cancer Neg Hx   . Breast cancer Neg Hx   . Hypertension Neg Hx   . Heart disease Neg Hx   . Heart attack Neg Hx   . Stroke Neg Hx     Social History    Social History   Social History  . Marital status: Married    Spouse name: N/A  . Number of children: 2  . Years of education: N/A   Occupational History  . retired Education administrator   Social History Main Topics  . Smoking status: Never Smoker  . Smokeless tobacco: Never Used  . Alcohol use No  . Drug use: No  . Sexual activity: Not on file   Other Topics Concern  . Not on file   Social History Narrative   HSG.  Married '65.  2 daughters, '71, '77; 4 grandchildren              Review of Systems    General:  No chills, fever, night sweats or weight changes.  Cardiovascular: See HPI Dermatological: No rash, lesions/masses Respiratory: No cough, dyspnea Urologic: No hematuria, dysuria Abdominal:   No nausea, vomiting, diarrhea, bright red blood per rectum, melena, or hematemesis Neurologic:  No visual changes, wkns, changes in mental status. All other systems reviewed and are otherwise negative except as noted above.  Physical Exam    Blood pressure 189/66, pulse 62, temperature 97.9 F (36.6 C), temperature source Oral, resp. rate 11, SpO2 95 %.  General: Pleasant older WF, NAD Psych: Normal affect. Neuro: Alert and oriented X 3. Moves all extremities spontaneously. HEENT: Normal  Neck: Supple without bruits or JVD. Lungs:  Resp regular and unlabored, CTA. Heart: RRR brady cardiac, no s3, s4, or murmurs. Abdomen: Soft, non-tender,  non-distended, BS + x 4.  Extremities: No clubbing, cyanosis or edema. DP/PT/Radials 2+ and equal bilaterally.  Labs    Troponin (Point of Care Test) No results for input(s): TROPIPOC in the last 72 hours.  Recent Labs  02/20/16 1449  TROPONINI <0.03   Lab Results  Component Value Date   WBC 8.5 02/20/2016   HGB 13.3 02/20/2016   HCT 40.1 02/20/2016   MCV 84.6 02/20/2016   PLT 264 02/20/2016    Recent Labs Lab 02/20/16 1449  NA 139  K 4.0  CL 108  CO2 25  BUN 20  CREATININE 0.89  CALCIUM 8.9  PROT 6.6  BILITOT 1.0  ALKPHOS 136*  ALT 68*  AST 48*  GLUCOSE 96   Lab Results  Component Value Date   CHOL 289 (H) 01/13/2016   HDL 101.50 01/13/2016   LDLCALC 163 (H) 01/13/2016   TRIG 121.0 01/13/2016   No results found for: [facility]   Radiology Studies    Ct Head Wo Contrast  Result Date: 02/11/2016 CLINICAL DATA:  70 year old female with headaches and tremors for 5 days. No recent injury. Personal history of right frontal hemorrhagic contusion from closed head injury. Initial encounter. EXAM: CT HEAD WITHOUT CONTRAST TECHNIQUE: Contiguous axial images were obtained from the base of the skull through the vertex without intravenous contrast. COMPARISON:  Head CTs without contrast 03/06/2015 and earlier. FINDINGS: Brain: Chronic encephalomalacia along the lateral inferior right frontal gyrus tracking to the anterior operculum corresponding to evolution of the 2016 closed head injury. Cerebral volume elsewhere appears stable. Mild chronic white matter hypodensity including in the anterior limb of the left internal capsule appears stable. No midline shift, ventriculomegaly, mass effect, evidence of mass lesion, intracranial hemorrhage or evidence of cortically based acute infarction. Vascular: No suspicious intracranial vascular hyperdensity. Skull: Stable and intact.  No acute osseous abnormality identified. Sinuses/Orbits: Visualized paranasal sinuses and mastoids are stable  and well pneumatized. Other: Visualized orbits and scalp soft tissues are within normal limits. IMPRESSION: 1. Expected evolution of the posttraumatic right frontotemporal encephalomalacia since 2016. 2.  No acute intracranial abnormality. 3. Mild for age chronic white matter changes most commonly due to chronic small vessel disease. Electronically Signed   By: Genevie Ann M.D.   On: 02/11/2016 15:50    ECG & Cardiac Imaging    EKG: Mobitz type II Rate 32  Echo: 5/17  Study Conclusions  - Left ventricle: The cavity size was normal. There was severe   focal basal hypertrophy of the septum. Systolic function was   normal. The estimated ejection fraction was in the range of 50%   to 55%. Anteroseptal hypokinesis. Doppler parameters are   consistent with abnormal left ventricular relaxation (grade 1   diastolic dysfunction). The E/e&' ratio is between 8-15,   suggesting indeterminate LV filling pressure. - Mitral valve: Mildly thickened leaflets . There was trivial   regurgitation. - Left atrium: The atrium was normal in size. - Tricuspid valve: There was mild regurgitation. - Pulmonary arteries: PA peak pressure: 32 mm Hg (S). - Inferior vena cava: The vessel was normal in size. The   respirophasic diameter changes were in the normal range (>= 50%),   consistent with normal central venous pressure.  Impressions:  - Compared to a prior echo in 2016, there are no changes.  Assessment & Plan    70 yo female with PMH of HTN, HLD, LBBB, and syncope who presented to the ED with c/o weakness and lightheadedness.   1. Weakness/Fatigue: Reports this has been ongoing since thanksgiving. Presented to the ED today and found to be in Mobitz type II. Last dose of metoprolol was 10am this morning.  -- admit to stepdown -- hold BB -- may need PPM if rate and rhythm do not improve with BB held.   2. HTN: Hypertensive in the ED. Holding BB, add PRN hydralazine.   3. Hx of syncope: Continue  pyridostigmine, hold midodrine as she is hypertensive at this time.    Barnet Pall, NP-C Pager [phone] 02/20/2016, 5:00 PM  I have seen and examined this patient with Reino Bellis.  Agree with above, note added to reflect my  findings.  On exam, regular rhythm, no murmurs, lungs clear, markedly bradycardic. Presented to the hospital with weakness, fatigue, and dizziness.  On ECG, 2:1 AV block. Takes metoprolol at home and took her last dose this AM. Plan for metoprolol washout and reassess for pacemaker tomorrow..    Maxmillian Carsey M. Keita Demarco MD 02/20/2016 9:27 PM

## 2016-02-20 NOTE — ED Notes (Signed)
Attempted report 

## 2016-02-20 NOTE — Telephone Encounter (Signed)
[facility] - Client [place] Patient Name: Mandy Durham DOB: 12-10-1945 Initial Comment caller states she has tremors, nausea, dizziness and headache x 1 week Nurse Assessment Nurse: Andria Frames, RN, Aeriel Date/Time (Eastern Time): 02/20/2016 12:49:19 PM Confirm and document reason for call. If symptomatic, describe symptoms. ---Caller states she has tremors, nausea, dizziness and headache x 1 week. The tremors isn't the reason she is calling. Dr. Larose Kells sent her to [facility] to set up a MRI. She is calling because her MRI has not been set up. The dizziness and headache started last week and she went to see the doctor and she gave her some medication for the tremors. She took the medication and has been sick every since. The name of it is primidone. She has been sick on her stomach dizzy and nauseated since she took that medication. Does the patient have any new or worsening symptoms? ---Yes Will a triage be completed? ---Yes Related visit to physician within the last 2 weeks? ---No Does the PT have any chronic conditions? (i.e. diabetes, asthma, etc.) ---No Is this a behavioral health or substance abuse call? ---No Guidelines Guideline Title Affirmed Question Affirmed Notes Headache Difficult to awaken or acting confused (e.g., disoriented, slurred speech) Final Disposition User Call EMS 911 Now Hensel, RN, Aeriel Comments Caller put pt husband on the phone and he said she is having some confusion. Nurse advising 911 will call back in 5 min. Disagree/Comply: Comply

## 2016-02-20 NOTE — ED Provider Notes (Signed)
[place] DEPT Provider Note   CSN: [id number] Arrival date & time: 02/20/16  1411     History   Chief Complaint Chief Complaint  Patient presents with  . Dizziness  . Bradycardia    HPI Mandy Durham is a 70 y.o. female.  HPI Patient presents to the emergency department with complaints of generalized weakness and lightheadedness over the past several days.  She's had nausea.  She's also had intermittent chest discomfort over the past several weeks.  She did have a heart catheterization 10-15 years ago per the patient which demonstrated normal coronary arteries.  Her chest discomfort usually occurs at rest at nighttime.  It is not always associated with exertion.  Denies vomiting.  Reports decreased oral intake today.    Past Medical History:  Diagnosis Date  . Adenomatous polyp of colon   . Anxiety state, unspecified   . Hyperlipidemia   . Hypertension   . Insomnia   . LBBB (left bundle branch block)    LHC in 2002 showed normal coronaries.   . Osteoarthrosis, unspecified whether generalized or localized, unspecified site   . Overweight(278.02)   . Symptomatic menopausal or female climacteric states   . Syncope and collapse    11/12: Holter 12/12 with rare PACS, HR range 64-140, average 82, no significant arrhythmias. Echo (1/13): EF 50-55%, mild LVH, septal-lateral dyssynchrony c/w LBBB. 3-week event monitor (1/13): No significant arrhythmia.     Patient Active Problem List   Diagnosis Date Noted  . Nystagmus, end-position 02/12/2016  . Dizziness and giddiness 02/12/2016  . Flapping tremor 02/12/2016  . Fatigue 10/28/2015  . Developmental dysfluency 03/27/2015  . PCP NOTES >>>> 02/13/2015  . Headache due to trauma 02/05/2015  . TBI (traumatic brain injury) ([place]) 01/28/2015  . Insomnia   . Hypoproteinemia ([place])   . Essential hypertension   . Acute post-traumatic headache, not intractable   . Skull fracture ([place])   . Vertigo due to brain injury ([place])   .  Subarachnoid bleed ([place]) 01/22/2015  . SAH (subarachnoid hemorrhage) ([place]) 01/21/2015  . Annual physical exam 09/07/2014  . Orthostatic hypotension 08/30/2014  . Acromioclavicular arthrosis 03/21/2014  . Allergic rhinitis 02/27/2014  . SI (sacroiliac) joint dysfunction 02/02/2014  . Gastrocnemius strain, left 12/11/2013  . Toenail fungus 11/15/2013  . Back pain 08/22/2013  . LBBB (left bundle branch block) 05/13/2013  . Shingles outbreak 12/15/2012  . Borderline diabetes   . Essential hypertension, benign 07/14/2012  . Syncope and collapse 02/15/2011  . Paresthesia of foot 07/07/2010  . INSOMNIA, CHRONIC, MILD 01/12/2008  . OVERWEIGHT 10/12/2007  . HLD (hyperlipidemia) 02/04/2007  . OSTEOPENIA 02/04/2007  . ANXIETY 10/12/2006  . Reactive depression 10/12/2006  . OSTEOARTHRITIS 10/12/2006    Past Surgical History:  Procedure Laterality Date  . TUBAL LIGATION      OB History    No data available       Home Medications    Prior to Admission medications   Medication Sig Start Date End Date Taking? Authorizing Provider  acetaminophen (TYLENOL) 500 MG tablet Take 500 mg by mouth every 6 (six) hours as needed.   Yes Historical Provider, MD  ALPRAZolam (XANAX XR) 0.5 MG 24 hr tablet Take 1 tablet (0.5 mg total) by mouth daily. 02/18/16  Yes Carmen Dohmeier, MD  levothyroxine (SYNTHROID, LEVOTHROID) 25 MCG tablet Take 1 tablet (25 mcg total) by mouth daily before breakfast. 12/24/15  Yes Meredith Staggers, MD  metoprolol tartrate (LOPRESSOR) 25 MG tablet TAKE ONE TABLET  BY MOUTH TWICE DAILY 01/28/16  Yes Larey Dresser, MD  midodrine (PROAMATINE) 2.5 MG tablet Take 1 tablet (2.5 mg total) by mouth 3 (three) times daily with meals. 08/23/15  Yes Larey Dresser, MD  PARoxetine (PAXIL) 20 MG tablet Take 0.5 tablets (10 mg total) by mouth daily. 12/24/15  Yes Meredith Staggers, MD  pyridostigmine (MESTINON) 60 MG tablet TAKE 1 AND 1/2 TABLETS BYMOUTH THREE TIMES A DAY 01/28/16  Yes  Larey Dresser, MD  azelastine (ASTELIN) 0.1 % nasal spray Place 2 sprays into both nostrils 2 (two) times daily. Patient not taking: Reported on 02/20/2016 07/22/15   Colon Branch, MD  carbidopa-levodopa (SINEMET IR) 25-100 MG tablet Take 1 tablet by mouth 3 (three) times daily. 02/18/16   Asencion Partridge Dohmeier, MD  ezetimibe (ZETIA) 10 MG tablet Take 1 tablet (10 mg total) by mouth daily. 01/16/16   Colon Branch, MD  ibuprofen (MOTRIN IB) 200 MG tablet Take 1 tablet (200 mg total) by mouth every 6 (six) hours as needed. Patient not taking: Reported on 02/20/2016 02/12/16   Larey Seat, MD  ondansetron (ZOFRAN) 4 MG tablet Take 1 tablet (4 mg total) by mouth every 8 (eight) hours as needed for nausea or vomiting. Patient not taking: Reported on 02/20/2016 02/19/16   Larey Seat, MD  primidone (MYSOLINE) 50 MG tablet Take 1 tablet (50 mg total) by mouth daily as needed. For tremors Patient not taking: Reported on 02/20/2016 01/10/16   Meredith Staggers, MD  topiramate (TOPAMAX) 25 MG capsule Take 25 mg by mouth daily as needed.    Historical Provider, MD    Family History Family History  Problem Relation Age of Onset  . Heart failure Mother   . Coronary artery disease Mother   . Dementia Mother   . Diabetes Mother   . Colon cancer Neg Hx   . Breast cancer Neg Hx   . Hypertension Neg Hx   . Heart disease Neg Hx   . Heart attack Neg Hx   . Stroke Neg Hx     Social History Social History  Substance Use Topics  . Smoking status: Never Smoker  . Smokeless tobacco: Never Used  . Alcohol use No     Allergies   Patient has no known allergies.   Review of Systems Review of Systems  All other systems reviewed and are negative.    Physical Exam Updated Vital Signs BP 142/95 (BP Location: Right Arm)   Pulse (!) 45   Temp 97.9 F (36.6 C) (Oral)   Resp 11   SpO2 93%   Physical Exam  Constitutional: She is oriented to person, place, and time. She appears well-developed and  well-nourished. No distress.  HENT:  Head: Normocephalic and atraumatic.  Eyes: EOM are normal.  Neck: Normal range of motion.  Cardiovascular:  Bradycardia.  Irregular heart rhythm  Pulmonary/Chest: Effort normal and breath sounds normal.  Abdominal: Soft. She exhibits no distension. There is no tenderness.  Musculoskeletal: Normal range of motion.  Neurological: She is alert and oriented to person, place, and time.  Skin: Skin is warm and dry.  Psychiatric: She has a normal mood and affect. Judgment normal.  Nursing note and vitals reviewed.    ED Treatments / Results  Labs (all labs ordered are listed, but only abnormal results are displayed) Labs Reviewed  COMPREHENSIVE METABOLIC PANEL - Abnormal; Notable for the following:       Result Value   AST 48 (*)  ALT 68 (*)    Alkaline Phosphatase 136 (*)    All other components within normal limits  CBC  TROPONIN I  MAGNESIUM  PHOSPHORUS  TSH    EKG  EKG Interpretation  Date/Time:  Thursday February 20 2016 14:17:24 EST Ventricular Rate:  32 PR Interval:    QRS Duration: 139 QT Interval:  572 QTC Calculation: 418 R Axis:   2 Text Interpretation:  Mobitz II 2-degree AV block Probable left atrial enlargement Left bundle branch block changed from prior ecg Confirmed by Netanya Yazdani  MD, Lennette Bihari ([id number]) on 02/20/2016 4:49:37 PM       Radiology No results found.  Procedures Procedures (including critical care time)  Medications Ordered in ED Medications  0.9 %  sodium chloride infusion (1,000 mLs Intravenous New Bag/Given 02/20/16 1518)    Followed by  0.9 %  sodium chloride infusion (not administered)  calcium gluconate 1 g in sodium chloride 0.9 % 100 mL IVPB (0 g Intravenous Stopped 02/20/16 1649)     Initial Impression / Assessment and Plan / ED Course  I have reviewed the triage vital signs and the nursing notes.  Pertinent labs & imaging results that were available during my care of the patient were reviewed  by me and considered in my medical decision making (see chart for details).  Clinical Course     Mobitz type II heart block.  She is likely symptomatically from this may benefit from pacer.  She is also on beta blockers and therefore these will need to be held.  Workup otherwise without significant abnormality.  Cardiology consultation for admission to a telemetry floor  Final Clinical Impressions(s) / ED Diagnoses   Final diagnoses:  Bradycardia    New Prescriptions New Prescriptions   No medications on file     Jola Schmidt, MD 02/20/16 1652

## 2016-02-20 NOTE — Telephone Encounter (Signed)
Please clarify

## 2016-02-20 NOTE — Telephone Encounter (Signed)
Called patient and transferred her to [facility] to speak with a RN, per note below.

## 2016-02-20 NOTE — Telephone Encounter (Signed)
Med faxed to pharmacy 02/18/2016. Rx declined.

## 2016-02-20 NOTE — Telephone Encounter (Signed)
Caller name: Relationship to patient: Self Can be reached: [phone] Pharmacy:  Reason for call: Patient request a call back to discuss why she is not feeling any better since last week.

## 2016-02-21 ENCOUNTER — Encounter (HOSPITAL_COMMUNITY): Payer: Self-pay

## 2016-02-21 ENCOUNTER — Encounter (HOSPITAL_COMMUNITY)
Admission: EM | Disposition: A | Discharge status: Home / Self Care | Payer: Self-pay | Source: Home / Self Care | Attending: Cardiology

## 2016-02-21 ENCOUNTER — Ambulatory Visit: Payer: Medicare HMO | Admitting: Internal Medicine

## 2016-02-21 HISTORY — PX: EP IMPLANTABLE DEVICE: SHX172B

## 2016-02-21 LAB — BASIC METABOLIC PANEL
Anion gap: 9 (ref 5–15)
BUN: 20 mg/dL (ref 6–20)
CALCIUM: 8.6 mg/dL — AB (ref 8.9–10.3)
CO2: 22 mmol/L (ref 22–32)
CREATININE: 0.9 mg/dL (ref 0.44–1.00)
Chloride: 110 mmol/L (ref 101–111)
Glucose, Bld: 83 mg/dL (ref 65–99)
Potassium: 4.2 mmol/L (ref 3.5–5.1)
SODIUM: 141 mmol/L (ref 135–145)

## 2016-02-21 LAB — GLUCOSE, CAPILLARY: Glucose-Capillary: 104 mg/dL — ABNORMAL HIGH (ref 65–99)

## 2016-02-21 SURGERY — PACEMAKER IMPLANT
Anesthesia: LOCAL

## 2016-02-21 MED ORDER — SODIUM CHLORIDE 0.9 % IV SOLN: INTRAVENOUS | Status: DC

## 2016-02-21 MED ORDER — CHLORHEXIDINE GLUCONATE 4 % EX LIQD
60.0000 mL | Freq: Once | CUTANEOUS | Status: AC
  Administered 2016-02-21: 4 via TOPICAL
  Filled 2016-02-21: qty 15

## 2016-02-21 MED ORDER — MIDAZOLAM HCL 5 MG/5ML IJ SOLN
INTRAMUSCULAR | Status: DC | PRN
  Administered 2016-02-21: 1 mg via INTRAVENOUS

## 2016-02-21 MED ORDER — LIDOCAINE HCL (PF) 1 % IJ SOLN
INTRAMUSCULAR | Status: DC | PRN
  Administered 2016-02-21: 45 mL via SUBCUTANEOUS

## 2016-02-21 MED ORDER — ACETAMINOPHEN 325 MG PO TABS
325.0000 mg | ORAL_TABLET | ORAL | Status: DC | PRN
Start: 2016-02-21 — End: 2016-02-21

## 2016-02-21 MED ORDER — ONDANSETRON HCL 4 MG/2ML IJ SOLN
INTRAMUSCULAR | Status: AC
  Filled 2016-02-21: qty 2

## 2016-02-21 MED ORDER — CEFAZOLIN SODIUM-DEXTROSE 2-4 GM/100ML-% IV SOLN
INTRAVENOUS | Status: AC
  Filled 2016-02-21: qty 100

## 2016-02-21 MED ORDER — CEFAZOLIN SODIUM-DEXTROSE 2-4 GM/100ML-% IV SOLN
2.0000 g | INTRAVENOUS | Status: AC
  Administered 2016-02-21: 2 g via INTRAVENOUS

## 2016-02-21 MED ORDER — FENTANYL CITRATE (PF) 100 MCG/2ML IJ SOLN
INTRAMUSCULAR | Status: AC
  Filled 2016-02-21: qty 2

## 2016-02-21 MED ORDER — LIDOCAINE HCL (PF) 1 % IJ SOLN
INTRAMUSCULAR | Status: AC
  Filled 2016-02-21: qty 60

## 2016-02-21 MED ORDER — CEFAZOLIN IN D5W 1 GM/50ML IV SOLN
1.0000 g | Freq: Four times a day (QID) | INTRAVENOUS | Status: AC
  Administered 2016-02-21 – 2016-02-22 (×3): 1 g via INTRAVENOUS
  Filled 2016-02-21 (×3): qty 50

## 2016-02-21 MED ORDER — CHLORHEXIDINE GLUCONATE 4 % EX LIQD
60.0000 mL | CUTANEOUS | Status: AC
  Administered 2016-02-21: 4 via TOPICAL
  Filled 2016-02-21: qty 15

## 2016-02-21 MED ORDER — MIDAZOLAM HCL 5 MG/5ML IJ SOLN
INTRAMUSCULAR | Status: AC
  Filled 2016-02-21: qty 5

## 2016-02-21 MED ORDER — GENTAMICIN SULFATE 40 MG/ML IJ SOLN
INTRAMUSCULAR | Status: AC
  Filled 2016-02-21: qty 2

## 2016-02-21 MED ORDER — SODIUM CHLORIDE 0.9 % IR SOLN
80.0000 mg | Status: AC
  Administered 2016-02-21: 80 mg

## 2016-02-21 MED ORDER — SODIUM CHLORIDE 0.9 % IV SOLN
INTRAVENOUS | Status: DC
Start: 2016-02-21 — End: 2016-02-21

## 2016-02-21 MED ORDER — SODIUM CHLORIDE 0.9 % IR SOLN: 80.0000 mg | Status: DC

## 2016-02-21 MED ORDER — FENTANYL CITRATE (PF) 100 MCG/2ML IJ SOLN
INTRAMUSCULAR | Status: DC | PRN
  Administered 2016-02-21: 25 ug via INTRAVENOUS

## 2016-02-21 MED ORDER — HEPARIN (PORCINE) IN NACL 2-0.9 UNIT/ML-% IJ SOLN
INTRAMUSCULAR | Status: DC | PRN
  Administered 2016-02-21: 500 mL

## 2016-02-21 MED ORDER — HEPARIN (PORCINE) IN NACL 2-0.9 UNIT/ML-% IJ SOLN
INTRAMUSCULAR | Status: AC
  Filled 2016-02-21: qty 500

## 2016-02-21 MED ORDER — ORAL CARE MOUTH RINSE
15.0000 mL | Freq: Two times a day (BID) | OROMUCOSAL | Status: DC
  Administered 2016-02-21 – 2016-02-22 (×2): 15 mL via OROMUCOSAL

## 2016-02-21 MED ORDER — ONDANSETRON HCL 4 MG/2ML IJ SOLN
4.0000 mg | Freq: Four times a day (QID) | INTRAMUSCULAR | Status: DC | PRN
Start: 2016-02-21 — End: 2016-02-21

## 2016-02-21 MED ORDER — ONDANSETRON HCL 4 MG/2ML IJ SOLN
INTRAMUSCULAR | Status: DC | PRN
  Administered 2016-02-21: 4 mg via INTRAVENOUS

## 2016-02-21 SURGICAL SUPPLY — 7 items
CABLE SURGICAL S-101-97-12 (CABLE) ×1 IMPLANT
LEAD CAPSURE NOVUS 45CM (Lead) ×1 IMPLANT
LEAD CAPSURE NOVUS 5076-52CM (Lead) ×1 IMPLANT
PAD DEFIB LIFELINK (PAD) ×1 IMPLANT
PPM ADVISA MRI DR A2DR01 (Pacemaker) ×1 IMPLANT
SHEATH CLASSIC 7F (SHEATH) ×2 IMPLANT
TRAY PACEMAKER INSERTION (PACKS) ×1 IMPLANT

## 2016-02-21 NOTE — Telephone Encounter (Signed)
Per the patient's chart, she's currently being seen in the ED.

## 2016-02-21 NOTE — Discharge Instructions (Signed)
° ° °  Supplemental Discharge Instructions for  Pacemaker/Defibrillator Patients  Activity No heavy lifting or vigorous activity with your left/right arm for 6 to 8 weeks.  Do not raise your left/right arm above your head for one week.  Gradually raise your affected arm as drawn below.             02/25/16                  02/26/16                   02/27/16                02/28/16 __  NO DRIVING until cleared to  [place] the wound area clean and dry.  Do not get this area wet for one week. No showers for one week; you may shower on  02/28/16   . - The tape/steri-strips on your wound will fall off; do not pull them off.  No bandage is needed on the site.  DO  NOT apply any creams, oils, or ointments to the wound area. - If you notice any drainage or discharge from the wound, any swelling or bruising at the site, or you develop a fever > 101? F after you are discharged home, call the office at once.  Special Instructions - You are still able to use cellular telephones; use the ear opposite the side where you have your pacemaker/defibrillator.  Avoid carrying your cellular phone near your device. - When traveling through airports, show security personnel your identification card to avoid being screened in the metal detectors.  Ask the security personnel to use the hand wand. - Avoid arc welding equipment, MRI testing (magnetic resonance imaging), TENS units (transcutaneous nerve stimulators).  Call the office for questions about other devices. - Avoid electrical appliances that are in poor condition or are not properly grounded. - Microwave ovens are safe to be near or to operate.  Additional information for defibrillator patients should your device go off: - If your device goes off ONCE and you feel fine afterward, notify the device clinic nurses. - If your device goes off ONCE and you do not feel well afterward, call 911. - If your device goes off TWICE, call 911. - If your  device goes off THREE times in one day, call 911.  DO NOT DRIVE YOURSELF OR A FAMILY MEMBER WITH A DEFIBRILLATOR TO THE HOSPITAL--CALL 911.

## 2016-02-21 NOTE — Progress Notes (Signed)
SUBJECTIVE: The patient is doing well today.  At this time, she denies chest pain, shortness of breath, or any new concerns.  . carbidopa-levodopa  1 tablet Oral TID  . ezetimibe  10 mg Oral Daily  . heparin  5,000 Units Subcutaneous Q8H  . levothyroxine  25 mcg Oral QAC breakfast  . PARoxetine  10 mg Oral Daily  . pyridostigmine  60 mg Oral Q8H   . sodium chloride 1,000 mL (02/21/16 0446)    OBJECTIVE: Physical Exam: Vitals:   02/21/16 0430 02/21/16 0500 02/21/16 0600 02/21/16 0700  BP: (!) 147/50 (!) 136/55 (!) 142/97 (!) 137/45  Pulse: (!) 40 (!) 41 (!) 49 (!) 39  Resp: 16 14 15 18   Temp:      TempSrc:      SpO2: 95% 95% 92% 94%  Weight:      Height:        Intake/Output Summary (Last 24 hours) at 02/21/16 0738 Last data filed at 02/21/16 0700  Gross per 24 hour  Intake          1270.83 ml  Output                0 ml  Net          1270.83 ml    Telemetry reveals 2:1 heart bock generaly rates 30's-40, intermittently 3:1 with slower V rates transiently.  GEN- The patient is well appearing, alert and oriented x 3 today.   Head- normocephalic, atraumatic Eyes-  Sclera clear, conjunctiva pink Ears- hearing intact Oropharynx- clear Neck- supple, no JVP Lungs- Clear to ausculation bilaterally, normal work of breathing Heart- RRR, bradycardic, no significant murmurs, no rubs or gallops GI- soft, NT, ND Extremities- no clubbing, cyanosis, or edema Skin- no rash or lesion Psych- euthymic mood, full affect Neuro- no gross deficits appreciated, resting tremor b/l hands  LABS: Basic Metabolic Panel:  Recent Labs  02/20/16 1449 02/21/16 0224  NA 139 141  K 4.0 4.2  CL 108 110  CO2 25 22  GLUCOSE 96 83  BUN 20 20  CREATININE 0.89 0.90  CALCIUM 8.9 8.6*  MG 2.1  --   PHOS 4.0  --    Liver Function Tests:  Recent Labs  02/20/16 1449  AST 48*  ALT 68*  ALKPHOS 136*  BILITOT 1.0  PROT 6.6  ALBUMIN 3.6   CBC:  Recent Labs  02/20/16 1449  WBC  8.5  HGB 13.3  HCT 40.1  MCV 84.6  PLT 264   Cardiac Enzymes:  Recent Labs  02/20/16 1449  TROPONINI <0.03   Thyroid Function Tests:  Recent Labs  02/20/16 1449  TSH 2.840    RADIOLOGY: Ct Head Wo Contrast Result Date: 02/11/2016 CLINICAL DATA:  70 year old female with headaches and tremors for 5 days. No recent injury. Personal history of right frontal hemorrhagic contusion from closed head injury. Initial encounter. EXAM: CT HEAD WITHOUT CONTRAST TECHNIQUE: Contiguous axial images were obtained from the base of the skull through the vertex without intravenous contrast. COMPARISON:  Head CTs without contrast 03/06/2015 and earlier. FINDINGS: Brain: Chronic encephalomalacia along the lateral inferior right frontal gyrus tracking to the anterior operculum corresponding to evolution of the 2016 closed head injury. Cerebral volume elsewhere appears stable. Mild chronic white matter hypodensity including in the anterior limb of the left internal capsule appears stable. No midline shift, ventriculomegaly, mass effect, evidence of mass lesion, intracranial hemorrhage or evidence of cortically based acute infarction. Vascular: No suspicious intracranial  vascular hyperdensity. Skull: Stable and intact.  No acute osseous abnormality identified. Sinuses/Orbits: Visualized paranasal sinuses and mastoids are stable and well pneumatized. Other: Visualized orbits and scalp soft tissues are within normal limits. IMPRESSION: 1. Expected evolution of the posttraumatic right frontotemporal encephalomalacia since 2016. 2.  No acute intracranial abnormality. 3. Mild for age chronic white matter changes most commonly due to chronic small vessel disease. Electronically Signed   By: Genevie Ann M.D.   On: 02/11/2016 15:50    ASSESSMENT AND PLAN:   1. Weakness     Symptomatic bradycardia, 2:1-3:1 AVblock     Last dose of home metoprolol (25mg  BID) was yesterday at 10:00AM     TSH wnl     May 2017, LVEF 50-55%  by echo, no significant VHD     Will plan PPM implant this afternoon if no improvement in conduction     Risks benefits discussed with the patient by Dr. Curt Bears, she wishes to proceed  2. HTN     Stable  3. Hx of recurrent syncope      On Midodrine, pyridostigmine, abd binder     autonaumicdysfunction     Nov 2016 suffered trauma/SAH  4. Tremor     Left arm initially Sept 2017 >> b/l last month     Seeing neuro, ?essential, post traumatic, pending ENT evaluation with concern of CNS leak/nasal discharge       Tommye Standard, Hershal Coria 02/21/2016 7:38 AM  I have seen and examined this patient with Tommye Standard.  Agree with above, note added to reflect my findings.  On exam, regular rhythm, no murmurs, lungs clear. Presented to the hospital with 2:1 AV block on metoprolol. Metoprolol washout currently ongoing but remains in 2:1 block.  Will plan for pacemaker today if conduction does not improve.  Risks and benefits discussed.  Risks include but not limited to bleeding, infection, tamponade, pneumothorax (3-5%). The patient understands these risks and has agreed with the procedure.    Will M. Camnitz MD 02/21/2016 7:57 AM

## 2016-02-21 NOTE — Progress Notes (Signed)
Initial Nutrition Assessment  INTERVENTION:   Once diet advanced start Ensure Enlive po BID, each supplement provides 350 kcal and 20 grams of protein   NUTRITION DIAGNOSIS:   Inadequate oral intake related to  (decreased appetite) as evidenced by per patient/family report.  GOAL:   Patient will meet greater than or equal to 90% of their needs  MONITOR:   Diet advancement, I & O's, Weight trends  REASON FOR ASSESSMENT:   Malnutrition Screening Tool    ASSESSMENT:   70 yo female with PMH of HTN, HLD, LBBB, and syncope who presented to the ED with c/o weakness and lightheadedness   Pt reports that she lost weight after falling a year ago but that it leveled off a few months ago. More recently her appetite has decreased again. 9% weight loss x 1 year.  Brunch (11am): 1/2 bacon biscuit Dinner: hamburger with only one side of the bun and some fries  Nutrition-Focused physical exam completed. Findings are no fat depletion, no muscle depletion, and mild edema.    Diet Order:  Diet NPO time specified Diet NPO time specified Except for: Sips with Meds  Skin:  Reviewed, no issues  Last BM:  unknown  Height:   Ht Readings from Last 1 Encounters:  02/20/16 5\' 5"  (1.651 m)    Weight:   Wt Readings from Last 1 Encounters:  02/20/16 145 lb 15.1 oz (66.2 kg)    Ideal Body Weight:  56.8 kg  BMI:  Body mass index is 24.29 kg/m.  Estimated Nutritional Needs:   Kcal:  1500-1700  Protein:  75-85 grams  Fluid:  >1.5 L/day  EDUCATION NEEDS:   Education needs addressed  Maylon Peppers RD, [place], [place] Pager [phone] After Hours Pager

## 2016-02-22 ENCOUNTER — Telehealth: Payer: Self-pay | Admitting: Neurology

## 2016-02-22 ENCOUNTER — Inpatient Hospital Stay (HOSPITAL_COMMUNITY): Payer: Medicare HMO

## 2016-02-22 ENCOUNTER — Other Ambulatory Visit: Payer: Self-pay | Admitting: Physician Assistant

## 2016-02-22 ENCOUNTER — Encounter (HOSPITAL_COMMUNITY): Payer: Self-pay | Admitting: Physician Assistant

## 2016-02-22 DIAGNOSIS — Z950 Presence of cardiac pacemaker: Secondary | ICD-10-CM | POA: Diagnosis present

## 2016-02-22 LAB — GLUCOSE, CAPILLARY: GLUCOSE-CAPILLARY: 124 mg/dL — AB (ref 65–99)

## 2016-02-22 MED ORDER — ONDANSETRON HCL 4 MG PO TABS: 4.0000 mg | ORAL_TABLET | Freq: Three times a day (TID) | ORAL | 0 refills | Status: DC | PRN

## 2016-02-22 MED ORDER — YOU HAVE A PACEMAKER BOOK
Freq: Once | Status: AC
  Administered 2016-02-22: 04:00:00
  Filled 2016-02-22: qty 1

## 2016-02-22 MED ORDER — ONDANSETRON HCL 4 MG PO TABS
4.0000 mg | ORAL_TABLET | Freq: Once | ORAL | Status: AC
  Administered 2016-02-22: 12:00:00 4 mg via ORAL
  Filled 2016-02-22: qty 1

## 2016-02-22 MED ORDER — OFF THE BEAT BOOK
Freq: Once | Status: AC
  Administered 2016-02-22: 1
  Filled 2016-02-22: qty 1

## 2016-02-22 NOTE — Progress Notes (Signed)
Education completed by nursing, unable to use video as system was not functional

## 2016-02-22 NOTE — Telephone Encounter (Signed)
I had indeed discussed with the patient that Xanax and Mysoline to be taken simultaneously can lead to oversedation. We agreed on a compromise to take Xanax not within 12 hours of taking Mysoline.   Larey Seat, MD

## 2016-02-22 NOTE — Discharge Summary (Signed)
Discharge Summary    Patient ID: Mandy Durham,  MRN: [id number], DOB/AGE: [birth year] 70 y.o.  Admit date: 02/20/2016 Discharge date: 02/22/2016  Primary Care Provider: Kathlene November Primary Cardiologist: Dr. Aundra Dubin / Dr. Curt Bears (EP)   Discharge Diagnoses    Principal Problem:   Mobitz II Active Problems:   HLD (hyperlipidemia)   Overweight   Syncope and collapse   LBBB (left bundle branch block)   Orthostatic hypotension   Essential hypertension   Weakness   S/P placement of cardiac pacemaker   Allergies No Known Allergies   History of Present Illness       70 yo female with PMH of HTN, HLD, LBBB, SAH, orthostatic hypotension and syncope who presented to the [facility] ED on 02/20/16 with weakness and lightheadedness. She was found to have symptomatic bradycardia and electrophysiology was asked for admission.   She has been followed by Dr. Aundra Dubin for her LBBB and syncope. Had a normal cardiac cath in 2002. Last visit was 8/17 where she reported ongoing dizziness, always with standing. She did wear an event monitor that showed no arrhythmias. She was continued on pyridostigmine and midodrine, along with her abd binder and compression stockings.   Seen by Neuro recently for evaluation of tremor. She has been trailed on multiple medications and plan for referral to ENT for work up of possible CNS leak/nasal discharge   She presented to [facility] ED on 02/20/16 with weakness. She had not been feeling well since thanksgiving. Decreased energy, weakness and light-headedness. States this became worse the night before admission when she went to lay down for bed. Also has had some intermittent chest discomfort at time, not associated with rest or exertion.   In ED, EKG showed Mobitz type II with a rate of 32. Labs were stable. Last dose of metoprolol was 02/20/16 at [place] [facility]     Consultants: none  Symptomatic bradycardia, 2:1-3:1 AV block: conduction did not improve with BB  washout. Of note she does take pyridostigmine which is known to cause AV block; however, this was felt important to continue for the treatment of her neurologic disorder. TSH and electrolytes WNL. She underwent successful implantation of Medtronic Advisa DR MRI SureScan model [id number] (serial number [id number]) pacemaker on 02/21/16. She has been told she cannot have a MRI for at least 6 weeks and needs to contact Dr. Curt Bears if she is scheduled to get a MRI. Tele with paced rhythm today. Device interrogation normal. CXR today with no PTX. Wound check appointment arranged.   HTN: stable  Hx of recurrent syncope and orthostatic hypotension: on Midodrine, pyridostigmine, abd binder. Autonomic dysfunction. 01/2015 suffered trauma/SAH. Midodrine has been held during admission given high blood pressures with metoprolol being washed out. I will resume both metoprolol and midodrine at discharge and have her seen in the HTN clinic next week for follow up.  Tremor: left arm initially Sept 2017 >> b/l last month. Seeing neuro, ?essential, post traumatic, pending ENT evaluation with concern of CNS leak/nasal discharge. She is on several different medications to treat this. Reviewed Dr. Edwena Felty note which said no Alprazolam while taking mysoline. Discussed with patient who is still taking both and said "its fine because she takes the Xanax at night and mysoline in the AM." I will discontinue Xanax on discharge for now and send to Dr. Brett Fairy for review and further instructions.        The patient has had an uncomplicated hospital  course and is recovering well. She has been seen by Dr. Rayann Heman today and deemed ready for discharge home. All follow-up appointments have been scheduled. Discharge medications are listed below.  _____________  Discharge Vitals Blood pressure (!) 160/57, pulse 93, temperature 98.8 F (37.1 C), temperature source Oral, resp. rate 18, height 5\' 5"  (1.651 m), weight 154 lb 12.2 oz  (70.2 kg), SpO2 93 %.  Filed Weights   02/20/16 2030 02/22/16 0506  Weight: 145 lb 15.1 oz (66.2 kg) 154 lb 12.2 oz (70.2 kg)    Labs & Radiologic Studies     CBC  Recent Labs  02/20/16 1449  WBC 8.5  HGB 13.3  HCT 40.1  MCV 84.6  PLT [id number]   Basic Metabolic Panel  Recent Labs  02/20/16 1449 02/21/16 0224  NA 139 141  K 4.0 4.2  CL 108 110  CO2 25 22  GLUCOSE 96 83  BUN 20 20  CREATININE 0.89 0.90  CALCIUM 8.9 8.6*  MG 2.1  --   PHOS 4.0  --    Liver Function Tests  Recent Labs  02/20/16 1449  AST 48*  ALT 68*  ALKPHOS 136*  BILITOT 1.0  PROT 6.6  ALBUMIN 3.6   No results for input(s): LIPASE, AMYLASE in the last 72 hours. Cardiac Enzymes  Recent Labs  02/20/16 1449  TROPONINI <0.03   BNP Invalid input(s): POCBNP D-Dimer No results for input(s): DDIMER in the last 72 hours. Hemoglobin A1C No results for input(s): HGBA1C in the last 72 hours. Fasting Lipid Panel No results for input(s): CHOL, HDL, LDLCALC, TRIG, CHOLHDL, LDLDIRECT in the last 72 hours. Thyroid Function Tests  Recent Labs  02/20/16 1449  TSH 2.840    Dg Chest 2 View  Result Date: 02/22/2016 CLINICAL DATA:  Pacemaker placement EXAM: CHEST  2 VIEW COMPARISON:  01/21/2015 FINDINGS: Interval placement of left subclavian dual lead transvenous pacemaker. Leads in the right atrium and right ventricle in satisfactory position. No pneumothorax. Interval development of bilateral pleural effusions and bibasilar atelectasis. Mild vascular congestion. Possible mild fluid overload. IMPRESSION: Satisfactory transvenous pacemaker placement Interval development of bibasilar atelectasis and bilateral effusions. Possible mild fluid overload. Electronically Signed   By: Franchot Gallo M.D.   On: 02/22/2016 07:17   Ct Head Wo Contrast  Result Date: 02/11/2016 CLINICAL DATA:  70 year old female with headaches and tremors for 5 days. No recent injury. Personal history of right frontal hemorrhagic  contusion from closed head injury. Initial encounter. EXAM: CT HEAD WITHOUT CONTRAST TECHNIQUE: Contiguous axial images were obtained from the base of the skull through the vertex without intravenous contrast. COMPARISON:  Head CTs without contrast 03/06/2015 and earlier. FINDINGS: Brain: Chronic encephalomalacia along the lateral inferior right frontal gyrus tracking to the anterior operculum corresponding to evolution of the 2016 closed head injury. Cerebral volume elsewhere appears stable. Mild chronic white matter hypodensity including in the anterior limb of the left internal capsule appears stable. No midline shift, ventriculomegaly, mass effect, evidence of mass lesion, intracranial hemorrhage or evidence of cortically based acute infarction. Vascular: No suspicious intracranial vascular hyperdensity. Skull: Stable and intact.  No acute osseous abnormality identified. Sinuses/Orbits: Visualized paranasal sinuses and mastoids are stable and well pneumatized. Other: Visualized orbits and scalp soft tissues are within normal limits. IMPRESSION: 1. Expected evolution of the posttraumatic right frontotemporal encephalomalacia since 2016. 2.  No acute intracranial abnormality. 3. Mild for age chronic white matter changes most commonly due to chronic small vessel disease. Electronically Signed  By: Genevie Ann M.D.   On: 02/11/2016 15:50     Diagnostic Studies/Procedures    09/24/15 Conclusion  SURGEON:  Will Meredith Leeds, MD  PREPROCEDURE DIAGNOSIS:  4:1 AV block    POSTPROCEDURE DIAGNOSIS:  4:1 AV block     PROCEDURES:   1. Pacemaker implantation.     INTRODUCTION: Mandy Durham is a 70 y.o. female  with a history of bradycardia who presents today for pacemaker implantation.  The patient reports intermittent episodes of dizziness over the past few weeks.  No reversible causes have been identified.  The patient therefore presents today for pacemaker implantation.     DESCRIPTION OF PROCEDURE:   Informed written consent was obtained, and   the patient was brought to the electrophysiology lab in a fasting state.  The patients left chest was prepped and draped in the usual sterile fashion by the EP lab staff. The skin overlying the left deltopectoral region was infiltrated with lidocaine for local analgesia.  A 4-cm incision was made over the left deltopectoral region.  A left subcutaneous pacemaker pocket was fashioned using a combination of sharp and blunt dissection. Electrocautery was required to assure hemostasis.    RA/RV Lead Placement: The left axillary vein was therefore directly visualized and cannulated.  Through the left axillary vein, a Medtronic model 5076 (serial number [id number]) right atrial lead and a Medtronic model 5076 (serial number [id number]) right ventricular lead were advanced   with fluoroscopic visualization into the right atrial appendage and right ventricular apex positions respectively.  Initial atrial lead P- waves measured 2.38mV with impedance of 760 ohms and a threshold of 1.3 V at 0.5 msec.  Right ventricular lead R-waves measured 7.8 mV with an impedance of 1119 ohms and a threshold of 0.5 V at 0.5 msec.  Both leads were secured to the pectoralis fascia using #2-0 silk over the suture sleeves.   Device Placement:   The leads were then connected to a Medtronic Advisa DR MRI SureScan model [id number] (serial number [id number]) pacemaker.  The pocket was irrigated with copious gentamicin solution.  The pacemaker was then placed into the pocket.  The pocket was then closed in 3 layers with 2.0 Vicryl suture for the subcutaneous and 3.0 Vicryl suture subcuticular layers.  Steri-Strips and a sterile dressing were then applied. EBL<79ml. There were no early apparent complications.     CONCLUSIONS:   1. Successful implantation of a Medtronic Advisa DR MRI SureScan dual-chamber pacemaker for symptomatic bradycardia  2. No early apparent complications.       During  this procedure the patient is administered a total of Versed 1 mg and Fentanyl 25 mg to achieve and maintain moderate conscious sedation. The patient's heart rate, blood pressure, and oxygen saturation are monitored continuously during the procedure. The period of conscious sedation is 35 minutes, of which I was present face-to-face 100% of this time.     _____________    Disposition   Pt is being discharged home today in good condition.  Follow-up [place], MD Follow up.   Specialty:  Internal Medicine Contact information: [address] [place] [address] [phone]        [place] [facility] Follow up on 02/26/2016.   Specialty:  Cardiology Why:  11:30AM, wound check Contact information: [address], [place]  Will Meredith Leeds, MD Follow up on 05/19/2016.   Specialty:  Cardiology Why:  11:15AM Contact information: [address] [place] [address] [phone]        [facility] Follow up.   Why:  The office will call you to arrange follow up in our BP clinic to make sure your BP looks  okay . Contact information: [place] [phone]           Discharge Medications     Medication List    STOP taking these medications   ALPRAZolam 0.5 MG 24 hr tablet Commonly known as:  XANAX XR     TAKE these medications   acetaminophen 500 MG tablet Commonly known as:  TYLENOL Take 500 mg by mouth every 6 (six) hours as needed.   azelastine 0.1 % nasal spray Commonly known as:  ASTELIN Place 2 sprays into both nostrils 2 (two) times daily.   carbidopa-levodopa 25-100 MG tablet Commonly known as:  SINEMET IR Take 1 tablet by mouth 3 (three) times daily.   ezetimibe 10 MG tablet Commonly known  as:  ZETIA Take 1 tablet (10 mg total) by mouth daily.   ibuprofen 200 MG tablet Commonly known as:  MOTRIN IB Take 1 tablet (200 mg total) by mouth every 6 (six) hours as needed.   levothyroxine 25 MCG tablet Commonly known as:  SYNTHROID, LEVOTHROID Take 1 tablet (25 mcg total) by mouth daily before breakfast.   metoprolol tartrate 25 MG tablet Commonly known as:  LOPRESSOR TAKE ONE TABLET BY MOUTH TWICE DAILY   midodrine 2.5 MG tablet Commonly known as:  PROAMATINE Take 1 tablet (2.5 mg total) by mouth 3 (three) times daily with meals.   ondansetron 4 MG tablet Commonly known as:  ZOFRAN Take 1 tablet (4 mg total) by mouth every 8 (eight) hours as needed for nausea or vomiting.   PARoxetine 20 MG tablet Commonly known as:  PAXIL Take 0.5 tablets (10 mg total) by mouth daily.   primidone 50 MG tablet Commonly known as:  MYSOLINE Take 1 tablet (50 mg total) by mouth daily as needed. For tremors   pyridostigmine 60 MG tablet Commonly known as:  MESTINON TAKE 1 AND 1/2 TABLETS BYMOUTH THREE TIMES A DAY   topiramate 25 MG capsule Commonly known as:  TOPAMAX Take 25 mg by mouth daily as needed.        Outstanding Labs/Studies   BP check  Duration of Discharge Encounter   Greater than 30 minutes including physician time.  Signed, Angelena Form PA-C 02/22/2016, 9:07 AM   I have seen, examined the patient, and reviewed the above assessment and plan.  On exam, + tremor.  Device pocket is without hematoma. Changes to above are made where necessary.  Device interrogation is reviewed and notable for reduced P waves.  These are stable.  Denies CP or SOB.  CXR reveals stable leads.  Routine wound care and follow-up discussed with the patient.  DC to home  Co Sign: Thompson Grayer, MD 02/22/2016 9:37 AM

## 2016-02-22 NOTE — Progress Notes (Signed)
    After discharge note signed she complained of nausea and was given Zofran. This was also called into her pharmacy.  Angelena Form PA-C  [facility]

## 2016-02-24 ENCOUNTER — Encounter (HOSPITAL_COMMUNITY): Payer: Self-pay | Admitting: Cardiology

## 2016-02-25 ENCOUNTER — Telehealth: Payer: Self-pay

## 2016-02-25 ENCOUNTER — Telehealth: Payer: Self-pay | Admitting: Cardiology

## 2016-02-25 NOTE — Telephone Encounter (Signed)
Pt called back and stated that her BP is 158/81 which is normal for her and the BP machine read 89 for pulse rate. Informed pt that Dr. Lovena Le recommended that with her BP WNL for her and heart normal, her device would be checked in the office tomorrow. Informed pt that if SOB increases significantly and or chest pain develops   should go to the ER. Pt voiced understanding.

## 2016-02-25 NOTE — Telephone Encounter (Signed)
Called pt back, pt did not receive a home monitor to transmit remotely. Pt stated that SOB was not any worse, denies CP. Informed pt that if SOB increases significantly and or develops chest pain she should go to the ER. Pt voiced understanding.

## 2016-02-25 NOTE — Telephone Encounter (Signed)
Called pt back with Dr. Tanna Furry recommendation to have her take her BP and heart rate. Pt stated that she would and she would call back.

## 2016-02-25 NOTE — Telephone Encounter (Signed)
Returned patients call.  States that she has been having off and on mild sob since Monday.  Happens when lying down.  No CP.  She had a pacemaker placed last Friday. She has an appt tomorrow for follow-up.   Patient seems anxious.

## 2016-02-25 NOTE — Telephone Encounter (Signed)
Transition Care Management Follow-up Telephone Call  ADMISSION DATE: 02/20/16  DISCHARGE DATE: 02/22/16   How have you been since you were released from the hospital? Patient states she has not been feeling well since her return home from hospital. States she has been short of breath. Advisewd patient that she needed to go to Ed ASAP.     Do you understand why you were in the hospital? YES   Do you understand the discharge instrcutions? Patient states she does understand discharge instructions.  Items Reviewed:  Medications reviewed:  Medications unchanged on discharge.  Allergies reviewed: NKDA  Dietary changes reviewed:Low Sodium Heart Healthy  Referrals reviewed: Reviewed referral with patient. States she is going to call Cardiology today to see if she can be seen.    Functional Questionnaire:   Activities of Daily Living (ADLs):  Patient needs assistance at this time with ADL's   Any transportation issues/concerns?: No  Any patient concerns? Concerned with SOB advised to go to ED if she is not able to be seen at Cardiology today. Patient agreed.  Confirmed importance and date/time of follow-up visits scheduled: Yes  Confirmed with patient if condition begins to worsen call PCP or go to the ER. Yes    Patient was given the [facility] line [phone]: Yes

## 2016-02-25 NOTE — Telephone Encounter (Signed)
Pt c/o Shortness Of Breath: STAT if SOB developed within the last 24 hours or pt is noticeably SOB on the phone  1. Are you currently SOB (can you hear that pt is SOB on the phone)? A little   2. How long have you been experiencing SOB? It Started on Monday Afternoon   3. Are you SOB when sitting or when up moving around? Sitting and sometimes when moving around , but most of the time when she is in bed   4. Are you currently experiencing any other symptoms? no

## 2016-02-26 ENCOUNTER — Telehealth: Payer: Self-pay | Admitting: Neurology

## 2016-02-26 ENCOUNTER — Ambulatory Visit (INDEPENDENT_AMBULATORY_CARE_PROVIDER_SITE_OTHER): Payer: Medicare HMO | Admitting: *Deleted

## 2016-02-26 DIAGNOSIS — Z950 Presence of cardiac pacemaker: Secondary | ICD-10-CM

## 2016-02-26 NOTE — Telephone Encounter (Signed)
[facility]/[employer] Coverage Determination Dept [phone] called needing dx for PA for alprazolam. Doe sthe pt have dx of panic disorder or depression.

## 2016-02-26 NOTE — Progress Notes (Signed)
Wound check appointment. Steri-strips removed. Wound without redness or edema. Incision edges approximated, wound well healed. Pts left hand noted to be swollen, pt also c/o of intermittent dull CP and some SOB, most notably while lying in bed.  Pt assessed by Dr. Lovena Le, recommendations to elevate left arm and to call if swelling has not decreased in a week, pt also to call if CP or SOB gets worse, pr GT. Normal device function. Thresholds, sensing, and impedances consistent with implant measurements. Device programmed at 3.5V/auto capture programmed on for extra safety margin until 3 month visit. Histogram distribution appropriate for patient and level of activity. 1 AT/AF episode peak A 176bpm, 56min long. No high ventricular rates noted. Patient educated about wound care, arm mobility, lifting restrictions. ROV with WC 05/19/2016

## 2016-02-27 ENCOUNTER — Other Ambulatory Visit: Payer: Self-pay | Admitting: Internal Medicine

## 2016-02-27 ENCOUNTER — Encounter: Payer: Self-pay | Admitting: Internal Medicine

## 2016-03-02 ENCOUNTER — Inpatient Hospital Stay: Payer: Medicare HMO | Admitting: Family

## 2016-03-02 ENCOUNTER — Other Ambulatory Visit: Payer: Self-pay | Admitting: Cardiology

## 2016-03-02 NOTE — Telephone Encounter (Signed)
PPW that was faxed to [place] was filled out and faxed back in. I received approval letter from [facility]. Referral #: [id number] Effective 03/15/2015-03/15/2016.  I am contacting them back to verify dates.

## 2016-03-04 ENCOUNTER — Encounter: Payer: Self-pay | Admitting: Internal Medicine

## 2016-03-04 ENCOUNTER — Ambulatory Visit (INDEPENDENT_AMBULATORY_CARE_PROVIDER_SITE_OTHER): Payer: Medicare HMO | Admitting: Internal Medicine

## 2016-03-04 VITALS — BP 124/72 | HR 64 | Temp 98.1°F | Resp 14 | Ht 65.5 in | Wt 135.5 lb

## 2016-03-04 DIAGNOSIS — R531 Weakness: Secondary | ICD-10-CM

## 2016-03-04 DIAGNOSIS — R519 Headache, unspecified: Secondary | ICD-10-CM

## 2016-03-04 DIAGNOSIS — R195 Other fecal abnormalities: Secondary | ICD-10-CM

## 2016-03-04 DIAGNOSIS — R251 Tremor, unspecified: Secondary | ICD-10-CM

## 2016-03-04 DIAGNOSIS — R51 Headache: Secondary | ICD-10-CM

## 2016-03-04 DIAGNOSIS — F411 Generalized anxiety disorder: Secondary | ICD-10-CM | POA: Diagnosis not present

## 2016-03-04 DIAGNOSIS — G4700 Insomnia, unspecified: Secondary | ICD-10-CM

## 2016-03-04 MED ORDER — ALPRAZOLAM 0.25 MG PO TABS: 0.2500 mg | ORAL_TABLET | Freq: Every evening | ORAL | 0 refills | Status: DC | PRN

## 2016-03-04 NOTE — Patient Instructions (Signed)
Continue taking the medications per the list provided today  Return the stool test  If you continue to be bothered by tremors, contact your neurologist  Next visit in 2-3  months

## 2016-03-04 NOTE — Progress Notes (Signed)
Pre visit review using our clinic review tool, if applicable. No additional management support is needed unless otherwise documented below in the visit note. 

## 2016-03-04 NOTE — Progress Notes (Signed)
Subjective:    Patient ID: Mandy Durham, female    DOB: 06/07/1945, 70 y.o.   MRN: [id number]  DOS:  03/04/2016 Type of visit - description : Acute visit, multiple concerns Interval history: Has multiple concerns, she also was admitted to the hospital and saw neurology since the last visit. Neurology note reviewed. Hospital discharge note reviewed. Her main concern is that she simply " not feeling well":  Get flushed in the face when she walks. Denies exertional sweats, chest pain. No DOE consistently. No lower extremity edema Continued to feel tired despite the fact that they place a pacemaker, she was hoping the pacemaker will make her feel more energetic.  On and off nausea for months, so from helps. She is concerned about it. Denies vomiting, blood in the stools, upper abdominal pain, epigastric burning. He did make the observation that the stools are dark.  Appetite is decreased  Tremors : Neurology recommended Mysoline, she tried, make her "sick" so she self stopped it. Continue with tremor.  Insomnia: We'll like to go back on Xanax  Recently seen with headaches: They are essentially gone  She liked to review every single medication she takes , we did.   Review of Systems See above Denies depression.  Past Medical History:  Diagnosis Date  . Adenomatous polyp of colon   . Anxiety state, unspecified   . Hyperlipidemia   . Hypertension   . Insomnia   . LBBB (left bundle branch block)    LHC in 2002 showed normal coronaries.   . Osteoarthrosis, unspecified whether generalized or localized, unspecified site   . Overweight(278.02)   . S/P placement of cardiac pacemaker    a. 02/21/16: Medtronic Advisa DR MRI SureScan model [id number] (serial number [id number])   . Symptomatic menopausal or female climacteric states   . Syncope and collapse    11/12: Holter 12/12 with rare PACS, HR range 64-140, average 82, no significant arrhythmias. Echo (1/13): EF 50-55%, mild LVH,  septal-lateral dyssynchrony c/w LBBB. 3-week event monitor (1/13): No significant arrhythmia.     Past Surgical History:  Procedure Laterality Date  . EP IMPLANTABLE DEVICE N/A 02/21/2016   Procedure: Pacemaker Implant;  Surgeon: Will Meredith Leeds, MD;  Location: [facility];  Service: Cardiovascular;  Laterality: N/A;  . TUBAL LIGATION      Social History   Social History  . Marital status: Married    Spouse name: N/A  . Number of children: 2  . Years of education: N/A   Occupational History  . retired Education administrator   Social History Main Topics  . Smoking status: Never Smoker  . Smokeless tobacco: Never Used  . Alcohol use No  . Drug use: No  . Sexual activity: Not on file   Other Topics Concern  . Not on file   Social History Narrative   HSG.  Married '65.  2 daughters, '71, '77; 4 grandchildren               Allergies as of 03/04/2016   No Known Allergies     Medication List       Accurate as of 03/04/16 11:59 PM. Always use your most recent med list.          acetaminophen 500 MG tablet Commonly known as:  TYLENOL Take 500 mg by mouth every 6 (six) hours as needed.   ALPRAZolam 0.25 MG tablet Commonly known as:  XANAX Take 1-2 tablets (0.25-0.5  mg total) by mouth at bedtime as needed for anxiety or sleep.   carbidopa-levodopa 25-100 MG tablet Commonly known as:  SINEMET IR Take 1 tablet by mouth 3 (three) times daily.   ezetimibe 10 MG tablet Commonly known as:  ZETIA Take 1 tablet (10 mg total) by mouth daily.   ibuprofen 200 MG tablet Commonly known as:  MOTRIN IB Take 1 tablet (200 mg total) by mouth every 6 (six) hours as needed.   levothyroxine 25 MCG tablet Commonly known as:  SYNTHROID, LEVOTHROID Take 1 tablet (25 mcg total) by mouth daily before breakfast.   metoprolol tartrate 25 MG tablet Commonly known as:  LOPRESSOR TAKE ONE TABLET BY MOUTH TWICE DAILY   midodrine 2.5 MG  tablet Commonly known as:  PROAMATINE TAKE 1 TABLET BY MOUTH 3 TIMES DAILY WITH MEALS.   ondansetron 4 MG tablet Commonly known as:  ZOFRAN Take 1 tablet (4 mg total) by mouth every 8 (eight) hours as needed for nausea or vomiting.   PARoxetine 20 MG tablet Commonly known as:  PAXIL Take 0.5 tablets (10 mg total) by mouth daily.   pyridostigmine 60 MG tablet Commonly known as:  MESTINON TAKE 1 AND 1/2 TABLETS BYMOUTH THREE TIMES A DAY   topiramate 25 MG capsule Commonly known as:  TOPAMAX Take 25 mg by mouth daily as needed.          Objective:   Physical Exam BP 124/72 (BP Location: Left Arm, Patient Position: Sitting, Cuff Size: Small)   Pulse 64   Temp 98.1 F (36.7 C) (Oral)   Resp 14   Ht 5' 5.5" (1.664 m)   Wt 135 lb 8 oz (61.5 kg)   SpO2 98%   BMI 22.21 kg/m  General:   Well developed, well nourished . NAD.  HEENT:  Normocephalic . Face symmetric, atraumatic Lungs:  CTA B Normal respiratory effort, no intercostal retractions, no accessory muscle use. Heart: RRR,  no murmur.  no pretibial edema bilaterally  Abdomen:  Not distended, soft, non-tender. No rebound or rigidity.  Skin: Not pale. Not jaundice Neurologic:  alert & oriented X3.  Speech normal, bilateral upper tremors noted Psych--  Cognition and judgment appear intact.  Cooperative with normal attention span and concentration.  Behavior appropriate. No anxious or depressed appearing.    Assessment & Plan:   Assessment> Prediabetes  HTN Hyperlipidemia Anxiety, insomnia-- xanax, UDS low risk 07-2015 Menopausal; DEXA 2012 -1.5 @ gyn Recurrent Syncope -- extensive cards eval before, DX with autonomic insufficiency ~ 02-2015 --on a  abd binder , pyridostigmine, rx midodrine 06-2015 per cards  --symptomatic bradycardia 2:1-3:1 block, admitted 02-21-2016-- pacemaker placed-- no MRI x 6 weeks  --LBBB Traumatic SAH (after a syncope),TBI--- admitted 01-2015 , d/c to a rehab unit temporarily --  was rx seroquel (aparently for ICU delirium) -- d/c 07-2015 --rx topamax when at rehab for post [facility]  HAs -- had issues w/b/b incontinence, better as of 07-2015 Tremors LUE, onset 9 -2017  PLAN Headache, tremors, chronic nasal discharge: Since the last visit, CT was stable, she saw neurology, they prescribe Mysoline for tremors and a MRI. MRI had to be canceled due to recent pacemaker, she tried Mysoline, couldn't tolerate it due to nausea.  Also was Rx sinemet but has not started --->  Encouraged to do  Currently headaches are gone, tremors at baseline.  She was supposed to see an ENT doctor for the nasal discharge, she has not. Encouraged her to reschedule. Anxiety and insomnia: Denies  depression, on Paxil. She would like to go back on Xanax which was discontinued by neurology b/c she was prescribed mysoline. Since she is not taking that medication okay to restart Xanax Nausea-chronic, response to Zofran, no RUQ pain, no anemia, no GERD symptoms. Recent CT head is stable. Rec to cont w/ prn Zofran, IFOB to be sure there is no bleeding Fatigue, likely multifactorial, no anemia, last TSH satisfactory. All vitamin levels last year were okay. Recommend observation.  All medications reviewed with the patient. Medication list updated.    Today, I spent more than  45  min with the patient: >50% of the time counseling regards every single medication she takes, we went over the reason for the medication and potential for side effects. After a thorough chart review,  I tried to reassure her that although she feels fatigue, there is not a single etiology for her symptoms that we could treat and make her feel better immediately. Multiple questions answered to the best of my ability

## 2016-03-05 NOTE — Assessment & Plan Note (Signed)
Headache, tremors, chronic nasal discharge: Since the last visit, CT was stable, she saw neurology, they prescribe Mysoline for tremors and a MRI. MRI had to be canceled due to recent pacemaker, she tried Mysoline, couldn't tolerate it due to nausea.  Also was Rx sinemet but has not started --->  Encouraged to do  Currently headaches are gone, tremors at baseline.  She was supposed to see an ENT doctor for the nasal discharge, she has not. Encouraged her to reschedule. Anxiety and insomnia: Denies depression, on Paxil. She would like to go back on Xanax which was discontinued by neurology b/c she was prescribed mysoline. Since she is not taking that medication okay to restart Xanax Nausea-chronic, response to Zofran, no RUQ pain, no anemia, no GERD symptoms. Recent CT head is stable. Rec to cont w/ prn Zofran, IFOB to be sure there is no bleeding Fatigue, likely multifactorial, no anemia, last TSH satisfactory. All vitamin levels last year were okay. Recommend observation.  All medications reviewed with the patient. Medication list updated.

## 2016-03-23 ENCOUNTER — Ambulatory Visit: Payer: Self-pay | Admitting: Neurology

## 2016-03-25 ENCOUNTER — Encounter: Payer: Self-pay | Admitting: Physical Medicine & Rehabilitation

## 2016-03-25 ENCOUNTER — Encounter: Payer: PPO | Attending: Physical Medicine & Rehabilitation | Admitting: Physical Medicine & Rehabilitation

## 2016-03-25 VITALS — BP 152/88 | HR 60 | Resp 14

## 2016-03-25 DIAGNOSIS — E663 Overweight: Secondary | ICD-10-CM | POA: Diagnosis not present

## 2016-03-25 DIAGNOSIS — S062X9D Diffuse traumatic brain injury with loss of consciousness of unspecified duration, subsequent encounter: Secondary | ICD-10-CM | POA: Diagnosis not present

## 2016-03-25 DIAGNOSIS — E785 Hyperlipidemia, unspecified: Secondary | ICD-10-CM | POA: Insufficient documentation

## 2016-03-25 DIAGNOSIS — S06349D Traumatic hemorrhage of right cerebrum with loss of consciousness of unspecified duration, subsequent encounter: Secondary | ICD-10-CM | POA: Diagnosis not present

## 2016-03-25 DIAGNOSIS — R5383 Other fatigue: Secondary | ICD-10-CM | POA: Insufficient documentation

## 2016-03-25 DIAGNOSIS — F419 Anxiety disorder, unspecified: Secondary | ICD-10-CM | POA: Diagnosis not present

## 2016-03-25 DIAGNOSIS — I10 Essential (primary) hypertension: Secondary | ICD-10-CM | POA: Insufficient documentation

## 2016-03-25 DIAGNOSIS — S02119D Unspecified fracture of occiput, subsequent encounter for fracture with routine healing: Secondary | ICD-10-CM | POA: Diagnosis not present

## 2016-03-25 DIAGNOSIS — I447 Left bundle-branch block, unspecified: Secondary | ICD-10-CM | POA: Insufficient documentation

## 2016-03-25 DIAGNOSIS — S069X2S Unspecified intracranial injury with loss of consciousness of 31 minutes to 59 minutes, sequela: Secondary | ICD-10-CM | POA: Diagnosis not present

## 2016-03-25 DIAGNOSIS — F329 Major depressive disorder, single episode, unspecified: Secondary | ICD-10-CM | POA: Insufficient documentation

## 2016-03-25 DIAGNOSIS — R2689 Other abnormalities of gait and mobility: Secondary | ICD-10-CM | POA: Diagnosis not present

## 2016-03-25 NOTE — Progress Notes (Signed)
Subjective:    Patient ID: Mandy Durham, female    DOB: Jul 04, 1945, 71 y.o.   MRN: [id number]  HPI   Mandy Durham is here in follow up her TBI and associated deficits. She was admitted to [facility] with increasing weakness in December where she was found to be in Mobitz II heart block. She had a pacemaker placed.  Since the admission her energy has been better from a standpoint of her energy.   Her husband has noticed she still fatigues sometimes in the mid day. I asked her when she took her topamax and she told me it was in the morning. She continues on synthroid as rxed in the fall.   Her activity has consisted of mainly work around the house so far this winter considering the weather we've had. She's anxious to get outside and do things in the yard.      Pain Inventory Average Pain No pain Pain Right Now No pain My pain is No pain  In the last 24 hours, has pain interfered with the following? General activity No pain Relation with others No pain Enjoyment of life No pain What TIME of day is your pain at its worst? No pain Sleep (in general) Good  Pain is worse with: No pain Pain improves with: No pain Relief from Meds: No pain  Mobility ability to climb steps?  yes do you drive?  yes  Function retired  Neuro/Psych No problems in this area  Prior Studies Any changes since last visit?  no  Physicians involved in your care Any changes since last visit?  no   Family History  Problem Relation Age of Onset  . Heart failure Mother   . Coronary artery disease Mother   . Dementia Mother   . Diabetes Mother   . Colon cancer Neg Hx   . Breast cancer Neg Hx   . Hypertension Neg Hx   . Heart disease Neg Hx   . Heart attack Neg Hx   . Stroke Neg Hx    Social History   Social History  . Marital status: Married    Spouse name: N/A  . Number of children: 2  . Years of education: N/A   Occupational History  . retired Education administrator    Social History Main Topics  . Smoking status: Never Smoker  . Smokeless tobacco: Never Used  . Alcohol use No  . Drug use: No  . Sexual activity: Not Asked   Other Topics Concern  . None   Social History Narrative   HSG.  Married '65.  2 daughters, '71, '77; 4 grandchildren            Past Surgical History:  Procedure Laterality Date  . EP IMPLANTABLE DEVICE N/A 02/21/2016   Procedure: Pacemaker Implant;  Surgeon: Will Meredith Leeds, MD;  Location: [facility];  Service: Cardiovascular;  Laterality: N/A;  . TUBAL LIGATION     Past Medical History:  Diagnosis Date  . Adenomatous polyp of colon   . Anxiety state, unspecified   . Hyperlipidemia   . Hypertension   . Insomnia   . LBBB (left bundle branch block)    LHC in 2002 showed normal coronaries.   . Osteoarthrosis, unspecified whether generalized or localized, unspecified site   . Overweight(278.02)   . S/P placement of cardiac pacemaker    a. 02/21/16: Medtronic Advisa DR MRI SureScan model [id number] (serial number [id number])   . Symptomatic  menopausal or female climacteric states   . Syncope and collapse    11/12: Holter 12/12 with rare PACS, HR range 64-140, average 82, no significant arrhythmias. Echo (1/13): EF 50-55%, mild LVH, septal-lateral dyssynchrony c/w LBBB. 3-week event monitor (1/13): No significant arrhythmia.    BP (!) 152/88   Pulse 60   Resp 14   SpO2 96%   Opioid Risk Score:   Fall Risk Score:  `1  Depression screen PHQ 2/9  Depression screen [facility] 2/9 06/24/2015 04/23/2015 03/26/2015 03/22/2015 06/19/2014 05/29/2014  Decreased Interest 0 0 0 0 0 0  Down, Depressed, Hopeless 0 0 0 0 0 0  PHQ - 2 Score 0 0 0 0 0 0  Altered sleeping - - 0 - - -  Tired, decreased energy - - 0 - - -  Change in appetite - - 0 - - -  Feeling bad or failure about yourself  - - 0 - - -  Trouble concentrating - - 0 - - -  Moving slowly or fidgety/restless - - 0 - - -  Suicidal thoughts - - 0 - - -  PHQ-9 Score -  - 0 - - -  Some recent data might be hidden     Review of Systems  Constitutional: Negative.   HENT: Negative.   Eyes: Negative.   Respiratory: Negative.   Cardiovascular: Negative.   Gastrointestinal: Negative.   Endocrine: Negative.   Genitourinary: Negative.   Musculoskeletal: Negative.   Skin: Negative.   Allergic/Immunologic: Negative.   Neurological: Negative.   Hematological: Negative.   Psychiatric/Behavioral: Negative.   All other systems reviewed and are negative.      Objective:   Physical Exam  Constitutional: She appears well-developed and well-nourished. No distress.  HENT:  Head: Normocephalic.  Eyes: Conjunctivae and EOM are normal.  Neck: Normal range of motion.  Cardiovascular: RRR Respiratory: normal effort.  GI: Soft. Bowel sounds are normal. She exhibits no distension. There is no tenderness.  Musculoskeletal: She exhibits no edema or tenderness.  Neuro: speech still stuttering at times--able to convey thoughts. A&O x 3. No nystagmus.  Strength b/l UE 5/5 proximal to distal  B/l LE: 5/5 proximally to distally She is alert and oriented to person and place Speech/cognition nearly at baseline. Very appropriate. Normal insight and awareness.  Skin: Skin is warm and dry. No rash noted. She is not diaphoretic. No erythema.  Psychiatric: Her behavior is normal. Thought content normal.     Assessment & Plan:  1. Gait, balance, cognitive deficits secondary to traumatic right frontal hemorrhagic contusion, occipital skull fracture and SAH  -nearing baseline Continued HEP 2. Local driving is fine.  3. Resting tremor---LUE---improved.              -encouraged ongoing strengthening efforts with HEP 4. LBBB/Supine HTN/light headedness: On metoprolol bid for supine hypertension  -Mestonin/midodrine (see a bove) -teds/abdominal binder PRN   5. Fatigue: largely due to heart block  -now with pacemaker/followed by cardiology -continue  topiramate but change to night time dosing.  -paxil10mg   -will stop the synthroid. Can resume if she feels fatigue increases. Follow up thyroid in summer  15 minutes face to face patient care time were spent during this visit. All questions were encouraged and answered.  I'll see her back in about 6 MONTHS.

## 2016-03-25 NOTE — Patient Instructions (Signed)
CHANGE TOPAMAX TO NIGHT TIME SCHEDULE  STOP SYNTHROID  PLEASE CALL ME WITH ANY PROBLEMS OR QUESTIONS [phone])

## 2016-04-08 ENCOUNTER — Ambulatory Visit: Payer: Medicare HMO | Admitting: Neurology

## 2016-04-09 ENCOUNTER — Telehealth: Payer: Self-pay

## 2016-04-09 NOTE — Telephone Encounter (Signed)
On April 09, 2016 NCCSR was reviewed. No conflict was seen on the [place] with multiple prescribers. If there are any discrepancies this will be reported to her Physician.

## 2016-04-14 DIAGNOSIS — J300 Vasomotor rhinitis: Secondary | ICD-10-CM | POA: Diagnosis not present

## 2016-04-24 ENCOUNTER — Other Ambulatory Visit: Payer: Self-pay | Admitting: Internal Medicine

## 2016-04-24 ENCOUNTER — Other Ambulatory Visit (INDEPENDENT_AMBULATORY_CARE_PROVIDER_SITE_OTHER): Payer: PPO

## 2016-04-24 DIAGNOSIS — R195 Other fecal abnormalities: Secondary | ICD-10-CM | POA: Diagnosis not present

## 2016-04-24 LAB — FECAL OCCULT BLOOD, IMMUNOCHEMICAL: FECAL OCCULT BLD: NEGATIVE

## 2016-04-24 NOTE — Telephone Encounter (Signed)
Okay #60, one refill 

## 2016-04-24 NOTE — Telephone Encounter (Signed)
Rx printed, awaiting MD signature.  

## 2016-04-24 NOTE — Telephone Encounter (Signed)
Pt is requesting refill on Alprazolam.  Last OV: 03/04/2016 Last Fill: 03/04/2016 #60 and 0RF UDS: 07/22/2015 Low risk  Please advise.

## 2016-04-24 NOTE — Telephone Encounter (Signed)
Rx faxed to [facility]  

## 2016-05-05 ENCOUNTER — Other Ambulatory Visit: Payer: Self-pay | Admitting: Cardiology

## 2016-05-13 ENCOUNTER — Encounter: Payer: Self-pay | Admitting: Internal Medicine

## 2016-05-13 ENCOUNTER — Ambulatory Visit (INDEPENDENT_AMBULATORY_CARE_PROVIDER_SITE_OTHER): Payer: PPO | Admitting: Internal Medicine

## 2016-05-13 VITALS — BP 126/80 | HR 60 | Temp 98.1°F | Resp 14 | Ht 66.0 in | Wt 130.5 lb

## 2016-05-13 DIAGNOSIS — R51 Headache: Secondary | ICD-10-CM | POA: Diagnosis not present

## 2016-05-13 DIAGNOSIS — F411 Generalized anxiety disorder: Secondary | ICD-10-CM | POA: Diagnosis not present

## 2016-05-13 DIAGNOSIS — G4700 Insomnia, unspecified: Secondary | ICD-10-CM | POA: Diagnosis not present

## 2016-05-13 DIAGNOSIS — E785 Hyperlipidemia, unspecified: Secondary | ICD-10-CM

## 2016-05-13 DIAGNOSIS — G8929 Other chronic pain: Secondary | ICD-10-CM

## 2016-05-13 NOTE — Patient Instructions (Signed)
  GO TO THE FRONT DESK Schedule your next appointment for a check up in 6 months   Schedule a Medicare Wellness at your convenience

## 2016-05-13 NOTE — Progress Notes (Signed)
Pre visit review using our clinic review tool, if applicable. No additional management support is needed unless otherwise documented below in the visit note. 

## 2016-05-13 NOTE — Progress Notes (Signed)
Subjective:    Patient ID: Mandy Durham, female    DOB: 1945/09/06, 71 y.o.   MRN: [id number]  DOS:  05/13/2016 Type of visit - description : Follow-up Interval history:  Since the last office visit, saw ENT >>> good reports, no CSF leak. She complained of nausea, sx gone She complained of headaches: Symptoms improved Tremors are well-controlled. Medication list reviewed. Good compliance.  Review of Systems  Denies chest pain or difficulty breathing No nausea, vomiting, diarrhea Past Medical History:  Diagnosis Date  . Adenomatous polyp of colon   . Anxiety state, unspecified   . Hyperlipidemia   . Hypertension   . Insomnia   . LBBB (left bundle branch block)    LHC in 2002 showed normal coronaries.   . Osteoarthrosis, unspecified whether generalized or localized, unspecified site   . Overweight(278.02)   . S/P placement of cardiac pacemaker    a. 02/21/16: Medtronic Advisa DR MRI SureScan model [id number] (serial number [id number])   . Symptomatic menopausal or female climacteric states   . Syncope and collapse    11/12: Holter 12/12 with rare PACS, HR range 64-140, average 82, no significant arrhythmias. Echo (1/13): EF 50-55%, mild LVH, septal-lateral dyssynchrony c/w LBBB. 3-week event monitor (1/13): No significant arrhythmia.     Past Surgical History:  Procedure Laterality Date  . EP IMPLANTABLE DEVICE N/A 02/21/2016   Procedure: Pacemaker Implant;  Surgeon: Will Meredith Leeds, MD;  Location: [facility];  Service: Cardiovascular;  Laterality: N/A;  . TUBAL LIGATION      Social History   Social History  . Marital status: Married    Spouse name: N/A  . Number of children: 2  . Years of education: N/A   Occupational History  . retired Education administrator   Social History Main Topics  . Smoking status: Never Smoker  . Smokeless tobacco: Never Used  . Alcohol use No  . Drug use: No  . Sexual activity: Not on file    Other Topics Concern  . Not on file   Social History Narrative   HSG.  Married '65.  2 daughters, '71, '77; 4 grandchildren               Allergies as of 05/13/2016   No Known Allergies     Medication List       Accurate as of 05/13/16 11:59 PM. Always use your most recent med list.          acetaminophen 500 MG tablet Commonly known as:  TYLENOL Take 500 mg by mouth every 6 (six) hours as needed.   ALPRAZolam 0.25 MG tablet Commonly known as:  XANAX Take 1-2 tablets (0.25-0.5 mg total) by mouth at bedtime as needed for anxiety or sleep.   carbidopa-levodopa 25-100 MG tablet Commonly known as:  SINEMET IR Take 1 tablet by mouth 3 (three) times daily.   ezetimibe 10 MG tablet Commonly known as:  ZETIA Take 1 tablet (10 mg total) by mouth daily.   ibuprofen 200 MG tablet Commonly known as:  MOTRIN IB Take 1 tablet (200 mg total) by mouth every 6 (six) hours as needed.   ipratropium 0.06 % nasal spray Commonly known as:  ATROVENT Place 1-3 sprays into both nostrils 3 (three) times daily.   levothyroxine 25 MCG tablet Commonly known as:  SYNTHROID, LEVOTHROID Take 1 tablet (25 mcg total) by mouth daily before breakfast.   metoprolol tartrate 25 MG tablet Commonly known  as:  LOPRESSOR TAKE ONE TABLET BY MOUTH TWICE DAILY   midodrine 2.5 MG tablet Commonly known as:  PROAMATINE TAKE 1 TABLET BY MOUTH 3 TIMES DAILY WITH MEALS.   ondansetron 4 MG tablet Commonly known as:  ZOFRAN Take 1 tablet (4 mg total) by mouth every 8 (eight) hours as needed for nausea or vomiting.   PARoxetine 20 MG tablet Commonly known as:  PAXIL Take 0.5 tablets (10 mg total) by mouth daily.   pyridostigmine 60 MG tablet Commonly known as:  MESTINON TAKE 1 AND 1/2 TABLETS BYMOUTH THREE TIMES A DAY   topiramate 25 MG capsule Commonly known as:  TOPAMAX Take 25 mg by mouth at bedtime.          Objective:   Physical Exam BP 126/80 (BP Location: Left Arm, Patient  Position: Sitting, Cuff Size: Small)   Pulse 60   Temp 98.1 F (36.7 C) (Oral)   Resp 14   Ht 5\' 6"  (1.676 m)   Wt 130 lb 8 oz (59.2 kg)   SpO2 97%   BMI 21.06 kg/m  General:   Well developed, well nourished . NAD.  HEENT:  Normocephalic . Face symmetric, atraumatic Lungs:  CTA B Normal respiratory effort, no intercostal retractions, no accessory muscle use. Heart: RRR,  no murmur.  No pretibial edema bilaterally  Skin: Not pale. Not jaundice Neurologic:  alert & oriented X3.  Speech normal, gait appropriate for age and unassisted. Tremors not noticeable today Psych--  Cognition and judgment appear intact.  Cooperative with normal attention span and concentration.  Behavior appropriate. Seems to be doing very well emotionally.      Assessment & Plan:    Assessment> Prediabetes  HTN Hyperlipidemia Anxiety, insomnia-- xanax, UDS low risk 07-2015 Menopausal; DEXA 2012 -1.5 @ gyn Recurrent Syncope -- extensive cards eval before, DX with autonomic insufficiency ~ 02-2015 --on a  abd binder , pyridostigmine, rx midodrine 06-2015 per cards  --symptomatic bradycardia 2:1-3:1 block, admitted 02-21-2016-- pacemaker placed-- no MRI x 6 weeks  --LBBB Traumatic SAH (after a syncope),TBI--- admitted 01-2015 , d/c to a rehab unit temporarily -- was rx seroquel (aparently for ICU delirium) -- d/c 07-2015 --rx topamax when at rehab for post [facility]  HAs -- had issues w/b/b incontinence, better as of 07-2015 Tremors LUE, onset 9 -2017  PLAN Headache, tremors, chronic nasal discharge: Overall felt much improved compared to the last time, ENT saw the patient, nasal discharge unlikely to be a CSF leak. Continue present care High cholesterol: Start Zetia 01-2016. Labs on RTC Anxiety and insomnia: Back on Xanax, feeling great Nausea, chronic: Currently better, will take zofran prn Labs reviewed, not due for any particular blood work today. RTC 10-2016 Medicare wellness at her convenience

## 2016-05-14 NOTE — Assessment & Plan Note (Signed)
Headache, tremors, chronic nasal discharge: Overall felt much improved compared to the last time, ENT saw the patient, nasal discharge unlikely to be a CSF leak. Continue present care High cholesterol: Start Zetia 01-2016. Labs on RTC Anxiety and insomnia: Back on Xanax, feeling great Nausea, chronic: Currently better, will take zofran prn Labs reviewed, not due for any particular blood work today. RTC 10-2016 Medicare wellness at her convenience

## 2016-05-19 ENCOUNTER — Encounter: Payer: Self-pay | Admitting: Cardiology

## 2016-05-19 ENCOUNTER — Ambulatory Visit (INDEPENDENT_AMBULATORY_CARE_PROVIDER_SITE_OTHER): Payer: PPO | Admitting: Cardiology

## 2016-05-19 VITALS — BP 130/82 | HR 62 | Ht 65.0 in | Wt 128.0 lb

## 2016-05-19 DIAGNOSIS — Z950 Presence of cardiac pacemaker: Secondary | ICD-10-CM | POA: Diagnosis not present

## 2016-05-19 DIAGNOSIS — I459 Conduction disorder, unspecified: Secondary | ICD-10-CM

## 2016-05-19 LAB — CUP PACEART INCLINIC DEVICE CHECK
Brady Statistic AS VP Percent: 61.45 %
Brady Statistic RA Percent Paced: 38.54 %
Brady Statistic RV Percent Paced: 99.99 %
Date Time Interrogation Session: 20180306132605
Implantable Lead Implant Date: 20171208
Implantable Lead Implant Date: 20171208
Implantable Lead Location: 753859
Implantable Lead Model: 5076
Lead Channel Impedance Value: 494 Ohm
Lead Channel Impedance Value: 551 Ohm
Lead Channel Pacing Threshold Amplitude: 0.75 V
Lead Channel Pacing Threshold Pulse Width: 0.4 ms
Lead Channel Pacing Threshold Pulse Width: 0.4 ms
Lead Channel Setting Pacing Amplitude: 2.5 V
Lead Channel Setting Sensing Sensitivity: 4 mV
MDC IDC LEAD LOCATION: 753860
MDC IDC MSMT BATTERY REMAINING LONGEVITY: 122 mo
MDC IDC MSMT BATTERY VOLTAGE: 3.05 V
MDC IDC MSMT LEADCHNL RA IMPEDANCE VALUE: 437 Ohm
MDC IDC MSMT LEADCHNL RA SENSING INTR AMPL: 2.25 mV
MDC IDC MSMT LEADCHNL RV IMPEDANCE VALUE: 551 Ohm
MDC IDC MSMT LEADCHNL RV PACING THRESHOLD AMPLITUDE: 0.5 V
MDC IDC MSMT LEADCHNL RV SENSING INTR AMPL: 16.75 mV
MDC IDC PG IMPLANT DT: 20171208
MDC IDC SET LEADCHNL RA PACING AMPLITUDE: 2 V
MDC IDC SET LEADCHNL RV PACING PULSEWIDTH: 0.4 ms
MDC IDC STAT BRADY AP VP PERCENT: 38.54 %
MDC IDC STAT BRADY AP VS PERCENT: 0 %
MDC IDC STAT BRADY AS VS PERCENT: 0.01 %

## 2016-05-19 NOTE — Progress Notes (Signed)
Electrophysiology Office Note   Date:  05/19/2016   ID:  Mandy Durham, Mandy Durham [birth year], MRN [id number]  PCP:  Kathlene November, MD  Cardiologist:  Aundra Dubin Primary Electrophysiologist:  Will Meredith Leeds, MD    Chief Complaint  Patient presents with  . Pacemaker Check    Morbitz II/90 days post implant     History of Present Illness: Mandy Durham is a 71 y.o. female who presents today for electrophysiology evaluation.   Presented to the hospital in 3:1 AV block. Metoprolol washed out with continued block and the patient received a Medtronic dual chamber pacemaker.   Today, she denies symptoms of palpitations, chest pain, shortness of breath, orthopnea, PND, lower extremity edema, claudication, dizziness, presyncope, syncope, bleeding, or neurologic sequela. The patient is tolerating medications without difficulties and is otherwise without complaint today.    Past Medical History:  Diagnosis Date  . Adenomatous polyp of colon   . Anxiety state, unspecified   . Hyperlipidemia   . Hypertension   . Insomnia   . LBBB (left bundle branch block)    LHC in 2002 showed normal coronaries.   . Osteoarthrosis, unspecified whether generalized or localized, unspecified site   . Overweight(278.02)   . S/P placement of cardiac pacemaker    a. 02/21/16: Medtronic Advisa DR MRI SureScan model [id number] (serial number [id number])   . Symptomatic menopausal or female climacteric states   . Syncope and collapse    11/12: Holter 12/12 with rare PACS, HR range 64-140, average 82, no significant arrhythmias. Echo (1/13): EF 50-55%, mild LVH, septal-lateral dyssynchrony c/w LBBB. 3-week event monitor (1/13): No significant arrhythmia.    Past Surgical History:  Procedure Laterality Date  . EP IMPLANTABLE DEVICE N/A 02/21/2016   Procedure: Pacemaker Implant;  Surgeon: Will Meredith Leeds, MD;  Location: [facility];  Service: Cardiovascular;  Laterality: N/A;  . TUBAL LIGATION       Current  Outpatient Prescriptions  Medication Sig Dispense Refill  . acetaminophen (TYLENOL) 500 MG tablet Take 500 mg by mouth every 6 (six) hours as needed.    . ALPRAZolam (XANAX) 0.25 MG tablet Take 1-2 tablets (0.25-0.5 mg total) by mouth at bedtime as needed for anxiety or sleep. 60 tablet 1  . carbidopa-levodopa (SINEMET IR) 25-100 MG tablet Take 1 tablet by mouth 3 (three) times daily. 90 tablet 2  . ezetimibe (ZETIA) 10 MG tablet Take 1 tablet (10 mg total) by mouth daily. 30 tablet 5  . ibuprofen (MOTRIN IB) 200 MG tablet Take 1 tablet (200 mg total) by mouth every 6 (six) hours as needed. 30 tablet 0  . ipratropium (ATROVENT) 0.06 % nasal spray Place 1-3 sprays into both nostrils 3 (three) times daily.     Marland Kitchen levothyroxine (SYNTHROID, LEVOTHROID) 25 MCG tablet Take 1 tablet (25 mcg total) by mouth daily before breakfast. 30 tablet 3  . metoprolol tartrate (LOPRESSOR) 25 MG tablet TAKE ONE TABLET BY MOUTH TWICE DAILY 180 tablet 2  . midodrine (PROAMATINE) 2.5 MG tablet TAKE 1 TABLET BY MOUTH 3 TIMES DAILY WITH MEALS. 90 tablet 7  . ondansetron (ZOFRAN) 4 MG tablet Take 1 tablet (4 mg total) by mouth every 8 (eight) hours as needed for nausea or vomiting. 20 tablet 0  . PARoxetine (PAXIL) 20 MG tablet Take 0.5 tablets (10 mg total) by mouth daily. 30 tablet 5  . pyridostigmine (MESTINON) 60 MG tablet TAKE 1 AND 1/2 TABLETS BYMOUTH THREE TIMES A DAY 405 tablet 1  .  topiramate (TOPAMAX) 25 MG capsule Take 25 mg by mouth at bedtime.     No current facility-administered medications for this visit.     Allergies:   Patient has no known allergies.   Social History:  The patient  reports that she has never smoked. She has never used smokeless tobacco. She reports that she does not drink alcohol or use drugs.   Family History:  The patient's family history includes Coronary artery disease in her mother; Dementia in her mother; Diabetes in her mother; Heart failure in her mother.    ROS:  Please see  the history of present illness.   Otherwise, review of systems is positive for none.   All other systems are reviewed and negative.    PHYSICAL EXAM: VS:  BP 130/82   Pulse 62   Ht 5\' 5"  (1.651 m)   Wt 128 lb (58.1 kg)   BMI 21.30 kg/m  , BMI Body mass index is 21.3 kg/m. GEN: Well nourished, well developed, in no acute distress  HEENT: normal  Neck: no JVD, carotid bruits, or masses Cardiac: RRR; no murmurs, rubs, or gallops,no edema  Respiratory:  clear to auscultation bilaterally, normal work of breathing GI: soft, nontender, nondistended, + BS MS: no deformity or atrophy  Skin: warm and dry,  device pocket is well healed Neuro:  Strength and sensation are intact Psych: euthymic mood, full affect  EKG:  EKG is ordered today. Personal review of the ekg ordered shows sinus rhythm, V paced   Device interrogation is reviewed today in detail.  See PaceArt for details.   Recent Labs: 02/20/2016: ALT 68; Hemoglobin 13.3; Magnesium 2.1; Platelets 264; TSH 2.840 02/21/2016: BUN 20; Creatinine, Ser 0.90; Potassium 4.2; Sodium 141    Lipid Panel     Component Value Date/Time   CHOL 289 (H) 01/13/2016 0950   TRIG 121.0 01/13/2016 0950   HDL 101.50 01/13/2016 0950   CHOLHDL 3 01/13/2016 0950   VLDL 24.2 01/13/2016 0950   LDLCALC 163 (H) 01/13/2016 0950   LDLDIRECT 106.9 11/03/2012 0913     Wt Readings from Last 3 Encounters:  05/19/16 128 lb (58.1 kg)  05/13/16 130 lb 8 oz (59.2 kg)  03/04/16 135 lb 8 oz (61.5 kg)      Other studies Reviewed: Additional studies/ records that were reviewed today include: TTE 07/17/15  Review of the above records today demonstrates:  - Left ventricle: The cavity size was normal. There was severe   focal basal hypertrophy of the septum. Systolic function was   normal. The estimated ejection fraction was in the range of 50%   to 55%. Anteroseptal hypokinesis. Doppler parameters are   consistent with abnormal left ventricular relaxation  (grade 1   diastolic dysfunction). The E/e&' ratio is between 8-15,   suggesting indeterminate LV filling pressure. - Mitral valve: Mildly thickened leaflets . There was trivial   regurgitation. - Left atrium: The atrium was normal in size. - Tricuspid valve: There was mild regurgitation. - Pulmonary arteries: PA peak pressure: 32 mm Hg (S). - Inferior vena cava: The vessel was normal in size. The   respirophasic diameter changes were in the normal range (>= 50%),   consistent with normal central venous pressure.   ASSESSMENT AND PLAN:  1.  3:1 AV block: s/p medtronic dual chamber pacemaker on 02/21/16. Currently doing well without any major issues. Device functioning appropriately. No changes to the device today.  2. Hypertension: Currently well controlled. No changes necessary  3.  Hyperlipidemia: continue current management    Current medicines are reviewed at length with the patient today.   The patient does not have concerns regarding her medicines.  The following changes were made today:  none  Labs/ tests ordered today include:  Orders Placed This Encounter  Procedures  . EKG 12-Lead     Disposition:   FU with Will Camnitz 6 months  Signed, Will Meredith Leeds, MD  05/19/2016 12:06 PM     [place] [address] [place] [address] [phone] (office) [phone] (fax)

## 2016-05-19 NOTE — Patient Instructions (Signed)
Medication Instructions:    Your physician recommends that you continue on your current medications as directed. Please refer to the Current Medication list given to you today.  --- If you need a refill on your cardiac medications before your next appointment, please call your pharmacy. ---  Labwork:  None ordered  Testing/Procedures:  None ordered  Follow-Up: Remote monitoring is used to monitor your Pacemaker of ICD from home. This monitoring reduces the number of office visits required to check your device to one time per year. It allows [place] to keep an eye on the functioning of your device to ensure it is working properly. You are scheduled for a device check from home on 08/18/16. You may send your transmission at any time that day. If you have a wireless device, the transmission will be sent automatically. After your physician reviews your transmission, you will receive a postcard with your next transmission date.   Your physician wants you to follow-up in: 6 months with Dr. Curt Bears.  You will receive a reminder letter in the mail two months in advance. If you don't receive a letter, please call our office to schedule the follow-up appointment.  Thank you for choosing [facility]!!   Trinidad Curet, RN [phone]

## 2016-06-11 ENCOUNTER — Other Ambulatory Visit: Payer: Self-pay | Admitting: Neurology

## 2016-06-29 ENCOUNTER — Telehealth: Payer: Self-pay | Admitting: Internal Medicine

## 2016-06-29 ENCOUNTER — Other Ambulatory Visit: Payer: Self-pay | Admitting: Internal Medicine

## 2016-06-29 NOTE — Telephone Encounter (Signed)
Received electronic refill request from pharmacy for Alprazolam, has been forwarded to PCP for approval. However, midodrine is managed by cardiology, Pt will need to call them for refill.

## 2016-06-29 NOTE — Telephone Encounter (Signed)
Ok 60 and 1 RF 

## 2016-06-29 NOTE — Telephone Encounter (Signed)
Caller name: Relationship to patient: Self Can be reached: [phone]  Pharmacy:  [facility] ([facility]), [address] [phone] (Phone) [phone] (Fax)     Reason for call: Refill ALPRAZolam (XANAX) 0.25 MG tablet   midodrine (PROAMATINE) 2.5 MG tablet

## 2016-06-29 NOTE — Telephone Encounter (Signed)
Pt is requesting refill on Alprazolam.  Last OV: 05/14/2015 Last Fill: 04/24/2016 #60 and 1RF UDS: 07/22/2015 Low risk  Please advise.

## 2016-06-29 NOTE — Telephone Encounter (Signed)
Alprazolam phoned in to [facility], topiramate 25mg  refilled.

## 2016-06-30 NOTE — Telephone Encounter (Signed)
Patient informed. 

## 2016-07-18 ENCOUNTER — Other Ambulatory Visit: Payer: Self-pay | Admitting: Physical Medicine & Rehabilitation

## 2016-07-18 DIAGNOSIS — S069X3S Unspecified intracranial injury with loss of consciousness of 1 hour to 5 hours 59 minutes, sequela: Secondary | ICD-10-CM

## 2016-07-18 DIAGNOSIS — R5383 Other fatigue: Secondary | ICD-10-CM

## 2016-07-21 ENCOUNTER — Other Ambulatory Visit: Payer: Self-pay | Admitting: Physical Medicine & Rehabilitation

## 2016-07-21 DIAGNOSIS — R5383 Other fatigue: Secondary | ICD-10-CM

## 2016-07-21 DIAGNOSIS — S069X3S Unspecified intracranial injury with loss of consciousness of 1 hour to 5 hours 59 minutes, sequela: Secondary | ICD-10-CM

## 2016-07-30 ENCOUNTER — Other Ambulatory Visit: Payer: Self-pay | Admitting: Physical Medicine & Rehabilitation

## 2016-07-30 DIAGNOSIS — R5383 Other fatigue: Secondary | ICD-10-CM

## 2016-07-30 DIAGNOSIS — S069X3S Unspecified intracranial injury with loss of consciousness of 1 hour to 5 hours 59 minutes, sequela: Secondary | ICD-10-CM

## 2016-07-31 NOTE — Telephone Encounter (Signed)
Recieved electronic medication refill request for levothyroxine, last note stated:  -will stop the synthroid. Can resume if she feels fatigue increases. Follow up thyroid in summer  Should patient be seen or have tests before medication is refilled or is it ok to go ahead and refill, next appointment is 09-23-16

## 2016-08-07 ENCOUNTER — Encounter: Payer: Self-pay | Admitting: Internal Medicine

## 2016-08-07 ENCOUNTER — Ambulatory Visit (INDEPENDENT_AMBULATORY_CARE_PROVIDER_SITE_OTHER): Payer: PPO | Admitting: Internal Medicine

## 2016-08-07 VITALS — BP 126/78 | HR 59 | Temp 98.0°F | Resp 14 | Ht 65.0 in | Wt 125.1 lb

## 2016-08-07 DIAGNOSIS — F329 Major depressive disorder, single episode, unspecified: Secondary | ICD-10-CM | POA: Diagnosis not present

## 2016-08-07 DIAGNOSIS — G4700 Insomnia, unspecified: Secondary | ICD-10-CM

## 2016-08-07 DIAGNOSIS — F419 Anxiety disorder, unspecified: Secondary | ICD-10-CM | POA: Diagnosis not present

## 2016-08-07 DIAGNOSIS — F32A Depression, unspecified: Secondary | ICD-10-CM

## 2016-08-07 MED ORDER — FLUOXETINE HCL 20 MG PO TABS: ORAL_TABLET | ORAL | 1 refills | Status: DC

## 2016-08-07 NOTE — Progress Notes (Signed)
Subjective:    Patient ID: Mandy Durham, female    DOB: Nasira 20, 1947, 71 y.o.   MRN: [id number]  DOS:  08/07/2016 Type of visit - description : acute, cc depresion Interval history:  One-month history of not feeling herself. Loosing interest in doing things,  feeling sad down. "I need something that lift my spirits". Denies having physical problems like chest pain, difficulty breathing no DOE, she thinks is all psychological.  Review of Systems  No anxiety per se, on long-term Paxil. No suicidal ideas, no crying spell. Takes xanax  at night for sleep and that works well.   Past Medical History:  Diagnosis Date  . Adenomatous polyp of colon   . Anxiety state, unspecified   . Hyperlipidemia   . Hypertension   . Insomnia   . LBBB (left bundle branch block)    LHC in 2002 showed normal coronaries.   . Osteoarthrosis, unspecified whether generalized or localized, unspecified site   . Overweight(278.02)   . S/P placement of cardiac pacemaker    a. 02/21/16: Medtronic Advisa DR MRI SureScan model [id number] (serial number [id number])   . Symptomatic menopausal or female climacteric states   . Syncope and collapse    11/12: Holter 12/12 with rare PACS, HR range 64-140, average 82, no significant arrhythmias. Echo (1/13): EF 50-55%, mild LVH, septal-lateral dyssynchrony c/w LBBB. 3-week event monitor (1/13): No significant arrhythmia.     Past Surgical History:  Procedure Laterality Date  . EP IMPLANTABLE DEVICE N/A 02/21/2016   Procedure: Pacemaker Implant;  Surgeon: Will Meredith Leeds, MD;  Location: [facility];  Service: Cardiovascular;  Laterality: N/A;  . TUBAL LIGATION      Social History   Social History  . Marital status: Married    Spouse name: N/A  . Number of children: 2  . Years of education: N/A   Occupational History  . retired Education administrator   Social History Main Topics  . Smoking status: Never Smoker  . Smokeless  tobacco: Never Used  . Alcohol use No  . Drug use: No  . Sexual activity: Not on file   Other Topics Concern  . Not on file   Social History Narrative   HSG.  Married '65.  2 daughters, '71, '77; 4 grandchildren               Allergies as of 08/07/2016   No Known Allergies     Medication List       Accurate as of 08/07/16 11:59 PM. Always use your most recent med list.          acetaminophen 500 MG tablet Commonly known as:  TYLENOL Take 500 mg by mouth every 6 (six) hours as needed.   ALPRAZolam 0.25 MG tablet Commonly known as:  XANAX Take 1-2 tablets (0.25-0.5 mg total) by mouth at bedtime as needed for anxiety or sleep.   carbidopa-levodopa 25-100 MG tablet Commonly known as:  SINEMET IR TAKE ONE TABLET BY MOUTH THREE TIMES DAILY   ezetimibe 10 MG tablet Commonly known as:  ZETIA Take 1 tablet (10 mg total) by mouth daily.   FLUoxetine 20 MG tablet Commonly known as:  PROZAC 1 tablet a day for 3 weeks, then 1.5 tablets a day   ibuprofen 200 MG tablet Commonly known as:  MOTRIN IB Take 1 tablet (200 mg total) by mouth every 6 (six) hours as needed.   ipratropium 0.06 % nasal spray Commonly known  as:  ATROVENT Place 1-3 sprays into both nostrils 3 (three) times daily.   levothyroxine 25 MCG tablet Commonly known as:  SYNTHROID, LEVOTHROID Take 1 tablet (25 mcg total) by mouth daily before breakfast.   metoprolol tartrate 25 MG tablet Commonly known as:  LOPRESSOR TAKE ONE TABLET BY MOUTH TWICE DAILY   midodrine 2.5 MG tablet Commonly known as:  PROAMATINE TAKE 1 TABLET BY MOUTH 3 TIMES DAILY WITH MEALS.   ondansetron 4 MG tablet Commonly known as:  ZOFRAN Take 1 tablet (4 mg total) by mouth every 8 (eight) hours as needed for nausea or vomiting.   pyridostigmine 60 MG tablet Commonly known as:  MESTINON TAKE 1 AND 1/2 TABLETS BYMOUTH THREE TIMES A DAY   topiramate 25 MG tablet Commonly known as:  TOPAMAX Take 1 tablet (25 mg total) by  mouth daily.          Objective:   Physical Exam BP 126/78 (BP Location: Left Arm, Patient Position: Sitting, Cuff Size: Normal)   Pulse (!) 59   Temp 98 F (36.7 C) (Oral)   Resp 14   Ht 5\' 5"  (1.651 m)   Wt 125 lb 2 oz (56.8 kg)   SpO2 98%   BMI 20.82 kg/m  General:   Well developed, well nourished . NAD.  HEENT:  Normocephalic . Face symmetric, atraumatic Skin: Not pale. Not jaundice Neurologic:  alert & oriented X3.  Speech normal except for difficulty finding words Psych--  Cognition and judgment appear intact.  Cooperative with normal attention span and concentration.  Behavior appropriate. No anxious or depressed appearing.      Assessment & Plan:    Assessment> Prediabetes  HTN Hyperlipidemia Anxiety, insomnia-- xanax, UDS low risk 07-2015 Menopausal; DEXA 2012 -1.5 @ gyn Recurrent Syncope -- extensive cards eval before, DX with autonomic insufficiency ~ 02-2015 --on a  abd binder , pyridostigmine, rx midodrine 06-2015 per cards  --symptomatic bradycardia 2:1-3:1 block, admitted 02-21-2016-- pacemaker placed-- no MRI x 6 weeks  --LBBB Traumatic SAH (after a syncope),TBI--- admitted 01-2015 , d/c to a rehab unit temporarily -- was rx seroquel (aparently for ICU delirium) -- d/c 07-2015 --rx topamax when at rehab for post [facility]  HAs -- had issues w/b/b incontinence, better as of 07-2015 Tremors LUE, onset 9 -2017  PLAN Depression : Started a month ago, PHQ-9 : 13. She lost her son-in-law a few months ago, has a daughter and 2 teenager G-children, lots of  worries. Anxiety has been well controlled with a low dose of Paxil and Xanax. Patient is counseled, rec to see a counselor, option of increased Paxil vs. other SSRI discussed, I actually recommend to change to fluoxetine, target dose 30 mg. See instructions, prescription sent. Anxiety, insomnia: Well-controlled on Paxil but switching to fluoxetine. See above. Continue Xanax RTC 6 weeks Today, I spent more than   25  min with the patient: >50% of the time counseling regards Depression treatment options including counseling, assessing the degree of depression with a PHQ 9, discussing potential for side effects.

## 2016-08-07 NOTE — Patient Instructions (Signed)
Come back in 6-8 weeks to see how you're doing. Please arrange an appointment  Stop paxil  Start fluoxetine 20 mg: --For 3 weeks take 1 tablet a day --After 3 weeks increased to 1.5 tablet every day  Call if side effects or problems  Consider see a counselor

## 2016-08-07 NOTE — Progress Notes (Signed)
Pre visit review using our clinic review tool, if applicable. No additional management support is needed unless otherwise documented below in the visit note. 

## 2016-08-09 NOTE — Assessment & Plan Note (Signed)
Depression : Started a month ago, PHQ-9 : 13. She lost her son-in-law a few months ago, has a daughter and 2 teenager G-children, lots of  worries. Anxiety has been well controlled with a low dose of Paxil and Xanax. Patient is counseled, rec to see a counselor, option of increased Paxil vs. other SSRI discussed, I actually recommend to change to fluoxetine, target dose 30 mg. See instructions, prescription sent. Anxiety, insomnia: Well-controlled on Paxil but switching to fluoxetine. See above. Continue Xanax RTC 6 weeks

## 2016-08-11 ENCOUNTER — Other Ambulatory Visit: Payer: Self-pay | Admitting: Cardiology

## 2016-08-13 ENCOUNTER — Other Ambulatory Visit: Payer: Self-pay | Admitting: Cardiology

## 2016-08-13 NOTE — Telephone Encounter (Signed)
Medication Detail    Disp Refills Start End   pyridostigmine (MESTINON) 60 MG tablet 270 tablet 3 08/13/2016    Sig: TAKE 1 AND 1/2 TABLETS BYMOUTH THREE TIMES A DAY   E-Prescribing Status: Receipt confirmed by pharmacy (08/13/2016 9:05 AM EDT)   Pharmacy   [facility] # [place], [place]

## 2016-08-17 ENCOUNTER — Encounter: Payer: Self-pay | Admitting: Internal Medicine

## 2016-08-18 ENCOUNTER — Ambulatory Visit (INDEPENDENT_AMBULATORY_CARE_PROVIDER_SITE_OTHER): Payer: PPO | Admitting: *Deleted

## 2016-08-18 ENCOUNTER — Telehealth: Payer: Self-pay | Admitting: Cardiology

## 2016-08-18 DIAGNOSIS — I459 Conduction disorder, unspecified: Secondary | ICD-10-CM

## 2016-08-18 NOTE — Progress Notes (Signed)
Remote pacemaker transmission.   

## 2016-08-18 NOTE — Telephone Encounter (Signed)
Spoke with pt and reminded pt of remote transmission that is due today. Pt verbalized understanding.   

## 2016-08-19 LAB — CUP PACEART REMOTE DEVICE CHECK
Battery Remaining Longevity: 114 mo
Brady Statistic AP VS Percent: 0 %
Brady Statistic AS VS Percent: 0 %
Brady Statistic RV Percent Paced: 100 %
Date Time Interrogation Session: 20180605162919
Implantable Lead Implant Date: 20171208
Implantable Lead Location: 753860
Implantable Lead Model: 5076
Lead Channel Impedance Value: 570 Ohm
Lead Channel Pacing Threshold Amplitude: 1 V
Lead Channel Sensing Intrinsic Amplitude: 16.75 mV
Lead Channel Sensing Intrinsic Amplitude: 2 mV
Lead Channel Sensing Intrinsic Amplitude: 2 mV
Lead Channel Setting Pacing Amplitude: 2.5 V
MDC IDC LEAD IMPLANT DT: 20171208
MDC IDC LEAD LOCATION: 753859
MDC IDC MSMT BATTERY VOLTAGE: 3.02 V
MDC IDC MSMT LEADCHNL RA IMPEDANCE VALUE: 475 Ohm
MDC IDC MSMT LEADCHNL RA IMPEDANCE VALUE: 627 Ohm
MDC IDC MSMT LEADCHNL RA PACING THRESHOLD PULSEWIDTH: 0.4 ms
MDC IDC MSMT LEADCHNL RV IMPEDANCE VALUE: 513 Ohm
MDC IDC MSMT LEADCHNL RV PACING THRESHOLD AMPLITUDE: 0.5 V
MDC IDC MSMT LEADCHNL RV PACING THRESHOLD PULSEWIDTH: 0.4 ms
MDC IDC MSMT LEADCHNL RV SENSING INTR AMPL: 16.75 mV
MDC IDC PG IMPLANT DT: 20171208
MDC IDC SET LEADCHNL RA PACING AMPLITUDE: 2 V
MDC IDC SET LEADCHNL RV PACING PULSEWIDTH: 0.4 ms
MDC IDC SET LEADCHNL RV SENSING SENSITIVITY: 4 mV
MDC IDC STAT BRADY AP VP PERCENT: 43.48 %
MDC IDC STAT BRADY AS VP PERCENT: 56.52 %
MDC IDC STAT BRADY RA PERCENT PACED: 43.48 %

## 2016-08-24 ENCOUNTER — Telehealth: Payer: Self-pay | Admitting: Cardiology

## 2016-08-24 NOTE — Telephone Encounter (Signed)
Upon returning call, pt states she isn't sure this is heart related, wondering if she should have called PCP first.  She reports pain in her stomach area, under the breast, around her belly, pain comes & goes and thinking she may have reflux/ulcer.  She will contact PCP and call our office if she needs to be seen after PCP visit. She thanks me for calling back.

## 2016-08-24 NOTE — Telephone Encounter (Signed)
Mandy Durham is calling because she has been feeling very weak hurting under the breast and she has no appetite . Please call

## 2016-08-25 ENCOUNTER — Encounter: Payer: Self-pay | Admitting: Cardiology

## 2016-08-28 ENCOUNTER — Encounter: Payer: Self-pay | Admitting: Family Medicine

## 2016-08-28 ENCOUNTER — Encounter: Payer: Self-pay | Admitting: Gastroenterology

## 2016-08-28 ENCOUNTER — Ambulatory Visit (INDEPENDENT_AMBULATORY_CARE_PROVIDER_SITE_OTHER): Payer: PPO | Admitting: Family Medicine

## 2016-08-28 VITALS — BP 118/78 | HR 60 | Temp 97.5°F | Ht 65.0 in | Wt 120.8 lb

## 2016-08-28 DIAGNOSIS — R195 Other fecal abnormalities: Secondary | ICD-10-CM

## 2016-08-28 DIAGNOSIS — R1084 Generalized abdominal pain: Secondary | ICD-10-CM | POA: Diagnosis not present

## 2016-08-28 DIAGNOSIS — R634 Abnormal weight loss: Secondary | ICD-10-CM

## 2016-08-28 MED ORDER — OMEPRAZOLE 20 MG PO CPDR: 20.0000 mg | DELAYED_RELEASE_CAPSULE | Freq: Two times a day (BID) | ORAL | 1 refills | Status: DC

## 2016-08-28 NOTE — Patient Instructions (Signed)
If you do not hear anything about your referral in the next 1-2 weeks, call our office and ask for an update.  MiraLAX daily for 3 days. If no improvement, use a mineral oil enema. These are available over the counter.  Stop chewing gum, drinking carbonated beverages, gulping liquids, and drinking alcohol to help with belching.  These foods may cause you to belch more: Wheat, barley, rye, onion, leek, white part of spring onion, garlic, shallots, artichokes, beetroot, fennel, peas, chicory, pistachio, cashews, legumes, lentils, and chickpeas; Milk, custard, ice cream, and yogurt; Apples, pears, mangoes, cherries, watermelon, asparagus, sugar snap peas, honey, high-fructose corn syrup; Apricots, nectarines, peaches, plums, mushrooms, cauliflower, artificially sweetened chewing gum and confectionery

## 2016-08-28 NOTE — Progress Notes (Signed)
Chief Complaint  Patient presents with  . Abdominal Pain    upper-x 1 mos-not getting any better    Mandy Durham is here for abdominal pain, central.  Duration: 1 mo, getting worse Quality: Achy and dull Feels like she needs to belch, but cannot Nighttime awakenings? Yes Bleeding? No Weight loss? Yes- unintentional ~5-10 lbs Palliation: Bringing legs to chest, mint, heat,  bowel movements Provocation: pushing on it, carbonation in liquids, normally eating makes it worse Associated symptoms: nausea, stools have turned darker since this started Denies: fever, vomiting and diarrhea Treatment to date: Pepto Bismol, mint gum  ROS: Constitutional: No fevers GI: No V/D/C, no bright red bleeding + pain  Past Medical History:  Diagnosis Date  . Adenomatous polyp of colon   . Anxiety state, unspecified   . Hyperlipidemia   . Hypertension   . Insomnia   . LBBB (left bundle branch block)    LHC in 2002 showed normal coronaries.   . Osteoarthrosis, unspecified whether generalized or localized, unspecified site   . Overweight(278.02)   . S/P placement of cardiac pacemaker    a. 02/21/16: Medtronic Advisa DR MRI SureScan model [id number] (serial number [id number])   . Symptomatic menopausal or female climacteric states   . Syncope and collapse    11/12: Holter 12/12 with rare PACS, HR range 64-140, average 82, no significant arrhythmias. Echo (1/13): EF 50-55%, mild LVH, septal-lateral dyssynchrony c/w LBBB. 3-week event monitor (1/13): No significant arrhythmia.    Family History  Problem Relation Age of Onset  . Heart failure Mother   . Coronary artery disease Mother   . Dementia Mother   . Diabetes Mother   . Colon cancer Neg Hx   . Breast cancer Neg Hx   . Hypertension Neg Hx   . Heart disease Neg Hx   . Heart attack Neg Hx   . Stroke Neg Hx    Past Surgical History:  Procedure Laterality Date  . EP IMPLANTABLE DEVICE N/A 02/21/2016   Procedure: Pacemaker Implant;  Surgeon:  Will Meredith Leeds, MD;  Location: [facility];  Service: Cardiovascular;  Laterality: N/A;  . TUBAL LIGATION      BP 118/78 (BP Location: Left Arm, Patient Position: Sitting, Cuff Size: Normal)   Pulse 60   Temp 97.5 F (36.4 C) (Oral)   Ht 5\' 5"  (1.651 m)   Wt 120 lb 12.8 oz (54.8 kg)   SpO2 98%   BMI 20.10 kg/m  Gen.: Awake, alert, appears stated age 71: Mucous membranes moist without mucosal lesions Heart: Regular rate and rhythm Lungs: Clear auscultation bilaterally, no rales or wheezing, normal effort without accessory muscle use. Abdomen: Bowel sounds are present. Abdomen is soft, diffusely TTP, nondistended, no masses or organomegaly. Negative Murphy's, Rovsing's, McBurney's, and Carnett's sign. Psych: Age appropriate judgment and insight. Normal mood and affect.  Generalized abdominal pain - Plan: Ambulatory referral to Gastroenterology, omeprazole (PRILOSEC) 20 MG capsule  Unintentional weight loss - Plan: Ambulatory referral to Gastroenterology  Dark stools - Plan: Ambulatory referral to Gastroenterology  Orders as above. Refer to GI due to weight loss and tarry stools. Last colonoscopy was in 2008. She is due this fall.  Eructation diet written down. MiraLAX for the next 3 days, enema afterwards. She is still passing gas. There may be a constipation component with this as well. F/u prn. Pt voiced understanding and agreement to the plan.  Anthony, DO 08/28/16 9:59 AM

## 2016-09-07 ENCOUNTER — Encounter: Payer: Self-pay | Admitting: Internal Medicine

## 2016-09-07 MED ORDER — ONDANSETRON HCL 4 MG PO TABS: 4.0000 mg | ORAL_TABLET | Freq: Three times a day (TID) | ORAL | 0 refills | Status: DC | PRN

## 2016-09-09 ENCOUNTER — Other Ambulatory Visit: Payer: Self-pay | Admitting: Internal Medicine

## 2016-09-10 ENCOUNTER — Encounter: Payer: Self-pay | Admitting: Cardiology

## 2016-09-10 NOTE — Telephone Encounter (Signed)
See rx. 

## 2016-09-10 NOTE — Telephone Encounter (Signed)
Pt is requesting refill on alprazolam 0.25mg . Paz Pt.  Last OV: 08/28/2016 w/ [place] Last Fill: 06/29/2016 #60 and 1RF UDS: 07/22/2015 Low risk  Please advise in PCP absence.   [place] Controlled Substance Database printed.

## 2016-09-10 NOTE — Telephone Encounter (Signed)
Rx faxed to [facility]  

## 2016-09-21 ENCOUNTER — Ambulatory Visit: Payer: PPO | Admitting: Internal Medicine

## 2016-09-21 ENCOUNTER — Encounter: Payer: Self-pay | Admitting: Internal Medicine

## 2016-09-21 ENCOUNTER — Ambulatory Visit (INDEPENDENT_AMBULATORY_CARE_PROVIDER_SITE_OTHER): Payer: PPO | Admitting: Internal Medicine

## 2016-09-21 VITALS — BP 116/68 | HR 60 | Temp 98.2°F | Resp 14 | Ht 65.0 in | Wt 119.4 lb

## 2016-09-21 DIAGNOSIS — F419 Anxiety disorder, unspecified: Secondary | ICD-10-CM

## 2016-09-21 DIAGNOSIS — F329 Major depressive disorder, single episode, unspecified: Secondary | ICD-10-CM

## 2016-09-21 DIAGNOSIS — R1013 Epigastric pain: Secondary | ICD-10-CM

## 2016-09-21 DIAGNOSIS — R739 Hyperglycemia, unspecified: Secondary | ICD-10-CM | POA: Diagnosis not present

## 2016-09-21 DIAGNOSIS — F32A Depression, unspecified: Secondary | ICD-10-CM

## 2016-09-21 MED ORDER — PANTOPRAZOLE SODIUM 40 MG PO TBEC: 40.0000 mg | DELAYED_RELEASE_TABLET | Freq: Two times a day (BID) | ORAL | 3 refills | Status: DC

## 2016-09-21 NOTE — Patient Instructions (Signed)
GO TO THE LAB : Get the blood work     GO TO THE FRONT DESK Schedule your next appointment for a  Check up 2 months from today  Stop omeprazole Start pantoprazole twice a day on an empty stomach before breakfast and before supper.  If you get worse: Increased pain, change in the color of the stools, blood in the stools please call the office immediately  Bring back the stool test.

## 2016-09-21 NOTE — Progress Notes (Signed)
Pre visit review using our clinic review tool, if applicable. No additional management support is needed unless otherwise documented below in the visit note. 

## 2016-09-21 NOTE — Progress Notes (Signed)
Subjective:    Patient ID: Mandy Durham, female    DOB: 06-Jun-1945, 71 y.o.   MRN: [id number]  DOS:  09/21/2016 Type of visit - description : Follow-up Interval history: Was seen several weeks ago with depression, she was switch from Paxil to fluoxetine, few days after was switch she realized she couldn't tolerate, make her feel funny and self stopped it. At this point depression is okay.  Her main concern today is epigastric pain, started approximately 6-8 weeks ago. Essentially no radiation, persistent all day long, worse when she eats. She has chronic nausea but no vomiting. She feels weak in general and reports weight loss.  Wt Readings from Last 3 Encounters:  09/21/16 119 lb 6 oz (54.1 kg)  08/28/16 120 lb 12.8 oz (54.8 kg)  08/07/16 125 lb 2 oz (56.8 kg)    Review of Systems Denies vomiting, blood in the stools or change in the color of the stools. They are usually somewhat dark and they are unchanged. No heartburn per se. Ibuprofen and omeprazole are on her medication release. Reports she's not taken ibuprofen or any other NSAIDs.Does use omeprazole on and off without much relief. No odynophagia, mild dysphagia?   Past Medical History:  Diagnosis Date  . Adenomatous polyp of colon 2007  . Anxiety state, unspecified   . Hyperlipidemia   . Hypertension   . Insomnia   . LBBB (left bundle branch block)    LHC in 2002 showed normal coronaries.   . Osteoarthrosis, unspecified whether generalized or localized, unspecified site   . Overweight(278.02)   . S/P placement of cardiac pacemaker    a. 02/21/16: Medtronic Advisa DR MRI SureScan model [id number] (serial number [id number])   . Symptomatic menopausal or female climacteric states   . Syncope and collapse    11/12: Holter 12/12 with rare PACS, HR range 64-140, average 82, no significant arrhythmias. Echo (1/13): EF 50-55%, mild LVH, septal-lateral dyssynchrony c/w LBBB. 3-week event monitor (1/13): No significant  arrhythmia.     Past Surgical History:  Procedure Laterality Date  . EP IMPLANTABLE DEVICE N/A 02/21/2016   Procedure: Pacemaker Implant;  Surgeon: Will Meredith Leeds, MD;  Location: [facility];  Service: Cardiovascular;  Laterality: N/A;  . TUBAL LIGATION      Social History   Social History  . Marital status: Married    Spouse name: N/A  . Number of children: 2  . Years of education: N/A   Occupational History  . retired Education administrator   Social History Main Topics  . Smoking status: Never Smoker  . Smokeless tobacco: Never Used  . Alcohol use No  . Drug use: No  . Sexual activity: Not on file   Other Topics Concern  . Not on file   Social History Narrative   HSG.  Married '65.  2 daughters, '71, '77; 4 grandchildren               Allergies as of 09/21/2016   No Known Allergies     Medication List       Accurate as of 09/21/16 11:59 PM. Always use your most recent med list.          acetaminophen 500 MG tablet Commonly known as:  TYLENOL Take 500 mg by mouth every 6 (six) hours as needed.   ALPRAZolam 0.25 MG tablet Commonly known as:  XANAX TAKE 1 TO 2 TABLETS BY MOUTH AT BEDTIME AS NEEDED FOR ANXIETY  OR SLEEP   carbidopa-levodopa 25-100 MG tablet Commonly known as:  SINEMET IR TAKE ONE TABLET BY MOUTH THREE TIMES DAILY   ezetimibe 10 MG tablet Commonly known as:  ZETIA TAKE ONE TABLET BY MOUTH ONCE DAILY   ipratropium 0.06 % nasal spray Commonly known as:  ATROVENT Place 1-3 sprays into both nostrils 3 (three) times daily.   levothyroxine 25 MCG tablet Commonly known as:  SYNTHROID, LEVOTHROID TAKE ONE TABLET BY MOUTH ONCE DAILY BEFORE BREAKFAST   metoprolol tartrate 25 MG tablet Commonly known as:  LOPRESSOR TAKE ONE TABLET BY MOUTH TWICE DAILY   midodrine 2.5 MG tablet Commonly known as:  PROAMATINE TAKE 1 TABLET BY MOUTH 3 TIMES DAILY WITH MEALS.   ondansetron 4 MG tablet Commonly known as:   ZOFRAN Take 1 tablet (4 mg total) by mouth every 8 (eight) hours as needed for nausea or vomiting.   pantoprazole 40 MG tablet Commonly known as:  PROTONIX Take 1 tablet (40 mg total) by mouth 2 (two) times daily.   pyridostigmine 60 MG tablet Commonly known as:  MESTINON TAKE 1 AND 1/2 TABLETS BYMOUTH THREE TIMES A DAY   topiramate 25 MG tablet Commonly known as:  TOPAMAX Take 1 tablet (25 mg total) by mouth daily.          Objective:   Physical Exam BP 116/68 (BP Location: Left Arm, Patient Position: Sitting, Cuff Size: Small)   Pulse 60   Temp 98.2 F (36.8 C) (Oral)   Resp 14   Ht 5\' 5"  (1.651 m)   Wt 119 lb 6 oz (54.1 kg)   SpO2 98%   BMI 19.87 kg/m  General:   Well developed, well nourished . NAD.  HEENT:  Normocephalic . Face symmetric, atraumatic. Not pale Neck: No LAD or mass Lungs:  CTA B Normal respiratory effort, no intercostal retractions, no accessory muscle use.  Heart: RRR,  no murmur.  no pretibial edema bilaterally  Abdomen:  Not distended, soft, slightly tender at the epigastric area. No mass.. No rebound or rigidity.   Skin: Not pale. Not jaundice Neurologic:  alert & oriented X3.  Speech normal, gait appropriate for age and unassisted Psych--  Cognition and judgment appear intact.  Cooperative with normal attention span and concentration.  Behavior appropriate. No anxious or depressed appearing.     Assessment & Plan:    Assessment Prediabetes  HTN Hyperlipidemia Anxiety, insomnia-- xanax, UDS low risk 07-2015 Menopausal; DEXA 2012 -1.5 @ gyn Recurrent Syncope -- extensive cards eval before, DX with autonomic insufficiency ~ 02-2015 --on a  abd binder , pyridostigmine, rx midodrine 06-2015 per cards  --symptomatic bradycardia 2:1-3:1 block, admitted 02-21-2016-- pacemaker placed-- no MRI x 6 weeks  --LBBB Traumatic SAH (after a syncope),TBI--- admitted 01-2015 , d/c to a rehab unit temporarily -- was rx seroquel (aparently for  ICU delirium) -- d/c 07-2015 --rx topamax when at rehab for post [facility]  HAs -- had issues w/b/b incontinence, better as of 07-2015 Tremors LUE, onset 9 -2017  PLAN Depression: Was seen 08/07/2016, Paxil switch to Prozac, within a week she realized she was feeling funny with it and self d/c. Currently not on SSRIs but female okay emotionally. Epigastric pain: Started approximately 6-8 weeks ago, not on NSAIDs, no previous abdominal surgery. PUD? GB? Plan: Pantoprazole twice a day, CMP, CBC,IFOB. abd [place]. Has a colonoscopy schedule 10/05/2016, will contact GI, ask them how they like to proceed. observation? Office visit? EGD at the same time of colonoscopy? (addendum: they  will contact the patient for a OV) ER if symptoms severe. Chronic nausea: See above ,unchanged  Hyperglycemia: Check A1c RTC 2 months

## 2016-09-22 LAB — CBC WITH DIFFERENTIAL/PLATELET
BASOS ABS: 0.1 10*3/uL (ref 0.0–0.1)
BASOS PCT: 0.9 % (ref 0.0–3.0)
EOS ABS: 0.1 10*3/uL (ref 0.0–0.7)
Eosinophils Relative: 1 % (ref 0.0–5.0)
HCT: 41.4 % (ref 36.0–46.0)
Hemoglobin: 13.8 g/dL (ref 12.0–15.0)
Lymphocytes Relative: 25.2 % (ref 12.0–46.0)
Lymphs Abs: 1.6 10*3/uL (ref 0.7–4.0)
MCHC: 33.2 g/dL (ref 30.0–36.0)
MCV: 85.6 fl (ref 78.0–100.0)
MONO ABS: 0.4 10*3/uL (ref 0.1–1.0)
Monocytes Relative: 6.5 % (ref 3.0–12.0)
NEUTROS ABS: 4.1 10*3/uL (ref 1.4–7.7)
NEUTROS PCT: 66.4 % (ref 43.0–77.0)
PLATELETS: 287 10*3/uL (ref 150.0–400.0)
RBC: 4.84 Mil/uL (ref 3.87–5.11)
RDW: 13.7 % (ref 11.5–15.5)
WBC: 6.2 10*3/uL (ref 4.0–10.5)

## 2016-09-22 LAB — COMPREHENSIVE METABOLIC PANEL
ALT: 14 U/L (ref 0–35)
AST: 18 U/L (ref 0–37)
Albumin: 4 g/dL (ref 3.5–5.2)
Alkaline Phosphatase: 67 U/L (ref 39–117)
BILIRUBIN TOTAL: 0.5 mg/dL (ref 0.2–1.2)
BUN: 15 mg/dL (ref 6–23)
CALCIUM: 9.5 mg/dL (ref 8.4–10.5)
CHLORIDE: 106 meq/L (ref 96–112)
CO2: 30 meq/L (ref 19–32)
CREATININE: 0.91 mg/dL (ref 0.40–1.20)
GFR: 64.59 mL/min (ref >60.00)
Glucose, Bld: 90 mg/dL (ref 70–99)
Potassium: 4.7 mEq/L (ref 3.5–5.1)
SODIUM: 139 meq/L (ref 135–145)
Total Protein: 6.8 g/dL (ref 6.0–8.3)

## 2016-09-22 LAB — HEMOGLOBIN A1C: HEMOGLOBIN A1C: 6.1 % (ref 4.6–6.5)

## 2016-09-22 NOTE — Assessment & Plan Note (Addendum)
Depression: Was seen 08/07/2016, Paxil switch to Prozac, within a week she realized she was feeling funny with it and self d/c. Currently not on SSRIs but female okay emotionally. Epigastric pain: Started approximately 6-8 weeks ago, not on NSAIDs, no previous abdominal surgery. PUD? GB? Plan: Pantoprazole twice a day, CMP, CBC,IFOB. abd [place]. Has a colonoscopy schedule 10/05/2016, will contact GI, ask them how they like to proceed. observation? Office visit? EGD at the same time of colonoscopy? (addendum: they will contact the patient for a OV) ER if symptoms severe. Chronic nausea: See above ,unchanged  Hyperglycemia: Check A1c RTC 2 months

## 2016-09-23 ENCOUNTER — Encounter: Payer: PPO | Attending: Physical Medicine & Rehabilitation | Admitting: Physical Medicine & Rehabilitation

## 2016-09-23 ENCOUNTER — Encounter: Payer: Self-pay | Admitting: Physical Medicine & Rehabilitation

## 2016-09-23 VITALS — BP 154/83 | HR 60

## 2016-09-23 DIAGNOSIS — M1990 Unspecified osteoarthritis, unspecified site: Secondary | ICD-10-CM | POA: Insufficient documentation

## 2016-09-23 DIAGNOSIS — I447 Left bundle-branch block, unspecified: Secondary | ICD-10-CM | POA: Diagnosis not present

## 2016-09-23 DIAGNOSIS — F419 Anxiety disorder, unspecified: Secondary | ICD-10-CM | POA: Diagnosis not present

## 2016-09-23 DIAGNOSIS — Z950 Presence of cardiac pacemaker: Secondary | ICD-10-CM | POA: Diagnosis not present

## 2016-09-23 DIAGNOSIS — G44319 Acute post-traumatic headache, not intractable: Secondary | ICD-10-CM | POA: Diagnosis not present

## 2016-09-23 DIAGNOSIS — X58XXXA Exposure to other specified factors, initial encounter: Secondary | ICD-10-CM | POA: Diagnosis not present

## 2016-09-23 DIAGNOSIS — F329 Major depressive disorder, single episode, unspecified: Secondary | ICD-10-CM | POA: Insufficient documentation

## 2016-09-23 DIAGNOSIS — G4700 Insomnia, unspecified: Secondary | ICD-10-CM | POA: Diagnosis not present

## 2016-09-23 DIAGNOSIS — I609 Nontraumatic subarachnoid hemorrhage, unspecified: Secondary | ICD-10-CM

## 2016-09-23 DIAGNOSIS — I10 Essential (primary) hypertension: Secondary | ICD-10-CM | POA: Insufficient documentation

## 2016-09-23 DIAGNOSIS — R5383 Other fatigue: Secondary | ICD-10-CM | POA: Diagnosis not present

## 2016-09-23 DIAGNOSIS — S066X9A Traumatic subarachnoid hemorrhage with loss of consciousness of unspecified duration, initial encounter: Secondary | ICD-10-CM | POA: Insufficient documentation

## 2016-09-23 DIAGNOSIS — E785 Hyperlipidemia, unspecified: Secondary | ICD-10-CM | POA: Diagnosis not present

## 2016-09-23 DIAGNOSIS — S02119A Unspecified fracture of occiput, initial encounter for closed fracture: Secondary | ICD-10-CM | POA: Insufficient documentation

## 2016-09-23 DIAGNOSIS — G44309 Post-traumatic headache, unspecified, not intractable: Secondary | ICD-10-CM | POA: Diagnosis not present

## 2016-09-23 MED ORDER — ESCITALOPRAM OXALATE 5 MG PO TABS: 5.0000 mg | ORAL_TABLET | Freq: Every day | ORAL | 3 refills | Status: DC

## 2016-09-23 NOTE — Patient Instructions (Addendum)
STOP TOPAMAX AND LEVOTHYROXINE  USE TYLENOL 500MG  AS NEEDED FOR HEADACHES.    USE COMPRESSION STOCKINGS AS NEEDED FOR DIZZINESS AND SWELLING.    PLEASE FEEL FREE TO CALL OUR OFFICE WITH ANY PROBLEMS OR QUESTIONS ([phone])

## 2016-09-23 NOTE — Progress Notes (Signed)
Subjective:    Patient ID: Mandy Durham, female    DOB: Jul 28, 1945, 71 y.o.   MRN: [id number]  HPI   Mrs. Minshall is here in follow up of right frontal hemorrhage. She reports that she has had intermittent headaches. She is using tylenol PM. The headaches are happening typically 1-2 x per week and are usually improved with the tylenol. The headaches are located typically in the frontal region without any radiation. She does remain on topirimate as prescribed.   Mrs. Hartung also reports a daily morning fog. She is not sure what might be causing it. She also complains of still feeling tired. I asked her if she felt depressed and she stated that she felt down sometimes and often doesn't want to leave the house. Dr. Larose Kells apparently had placed her on prozac but she stopped it when she read about side effects.   She continues on synthroid although we had discussed stopping this at last visit.   Mrs Welsch's bp has been better. She asked if she needed to still wear her TEDS.   Pain Inventory Average Pain na Pain Right Now na My pain is na  In the last 24 hours, has pain interfered with the following? General activity na Relation with others na Enjoyment of life na What TIME of day is your pain at its worst? na Sleep (in general) na  Pain is worse with: na Pain improves with: na Relief from Meds: na  Mobility walk without assistance  Function retired  Neuro/Psych No problems in this area  Prior Studies Any changes since last visit?  no  Physicians involved in your care Any changes since last visit?  no   Family History  Problem Relation Age of Onset  . Heart failure Mother   . Coronary artery disease Mother   . Dementia Mother   . Diabetes Mother   . Colon cancer Neg Hx   . Breast cancer Neg Hx   . Hypertension Neg Hx   . Heart disease Neg Hx   . Heart attack Neg Hx   . Stroke Neg Hx    Social History   Social History  . Marital status: Married    Spouse name:  N/A  . Number of children: 2  . Years of education: N/A   Occupational History  . retired Education administrator   Social History Main Topics  . Smoking status: Never Smoker  . Smokeless tobacco: Never Used  . Alcohol use No  . Drug use: No  . Sexual activity: Not on file   Other Topics Concern  . Not on file   Social History Narrative   HSG.  Married '65.  2 daughters, '71, '77; 4 grandchildren            Past Surgical History:  Procedure Laterality Date  . EP IMPLANTABLE DEVICE N/A 02/21/2016   Procedure: Pacemaker Implant;  Surgeon: Will Meredith Leeds, MD;  Location: [place] [facility];  Service: Cardiovascular;  Laterality: N/A;  . TUBAL LIGATION     Past Medical History:  Diagnosis Date  . Adenomatous polyp of colon 2007  . Anxiety state, unspecified   . Hyperlipidemia   . Hypertension   . Insomnia   . LBBB (left bundle branch block)    LHC in 2002 showed normal coronaries.   . Osteoarthrosis, unspecified whether generalized or localized, unspecified site   . Overweight(278.02)   . S/P placement of cardiac pacemaker    a.  02/21/16: Medtronic Advisa DR MRI SureScan model [id number] (serial number [id number])   . Symptomatic menopausal or female climacteric states   . Syncope and collapse    11/12: Holter 12/12 with rare PACS, HR range 64-140, average 82, no significant arrhythmias. Echo (1/13): EF 50-55%, mild LVH, septal-lateral dyssynchrony c/w LBBB. 3-week event monitor (1/13): No significant arrhythmia.    There were no vitals taken for this visit.  Opioid Risk Score:   Fall Risk Score:  `1  Depression screen PHQ 2/9  Depression screen [facility] 2/9 08/07/2016 06/24/2015 04/23/2015 03/26/2015 03/22/2015 06/19/2014 05/29/2014  Decreased Interest 3 0 0 0 0 0 0  Down, Depressed, Hopeless 3 0 0 0 0 0 0  PHQ - 2 Score 6 0 0 0 0 0 0  Altered sleeping 1 - - 0 - - -  Tired, decreased energy 1 - - 0 - - -  Change in appetite 1 - - 0 - - -  Feeling bad  or failure about yourself  0 - - 0 - - -  Trouble concentrating 2 - - 0 - - -  Moving slowly or fidgety/restless 2 - - 0 - - -  Suicidal thoughts 0 - - 0 - - -  PHQ-9 Score 13 - - 0 - - -  Difficult doing work/chores Somewhat difficult - - - - - -  Some recent data might be hidden     Review of Systems  Constitutional: Negative.   HENT: Negative.   Eyes: Negative.   Respiratory: Negative.   Cardiovascular: Negative.   Gastrointestinal: Negative.   Endocrine: Negative.   Genitourinary: Negative.   Musculoskeletal: Negative.   Skin: Negative.   Allergic/Immunologic: Negative.   Neurological: Negative.   Hematological: Negative.   Psychiatric/Behavioral: Negative.   All other systems reviewed and are negative.      Objective:   Physical Exam  Constitutional: She appears well-developed and well-nourished. No distress.  HENT:  Head: Normocephalic.  Eyes: Conjunctivae and EOM are normal.  Neck: Normal range of motion.  Cardiovascular: RRR Respiratory: normal effort.  GI: Soft. Bowel sounds are normal. She exhibits no distension. There is no tenderness.  Musculoskeletal: She exhibits no edema or tenderness.  Neuro:    Strength b/l UE 5/5 proximal to distal  B/l LE: 5/5 proximally to distally. Balance is functional  She is alert and oriented to person and place Speech/cognition nearly at baseline. Sometimes needs extra time for thought processing. Her speech can be halting.  Skin: Skin is warm and dry. No rash noted. She is not diaphoretic. No erythema.  Psychiatric: she is pleasant, appears more flat today     Assessment & Plan:  1. Gait, balance, cognitive deficits secondary to traumatic right frontal hemorrhagic contusion, occipital skull fracture and SAH  -nearing baseline Continued HEP 2. Headaches---appear to be generally tension, mild residual post-traumatic  -she's doing well enough that considering her morning fatigue/fog, I would like to stop  topamax  -she can use REGULAR tylenol 500mg  for breakthrough headaches which are mild and infrequent..  3. Resting tremor---LUE---improved.  -encouraged ongoing strengthening efforts with HEP  -sinemet? 4. LBBB/Supine HTN/light headedness: On metoprolol bid for supine hypertension  -Mestonin/midodrine (see a bove) -teds/abdominal binder PRN  -can go without TEDS as long as bp is holding. Her BP appears robust enough now.  5. Fatigue:               -now with pacemaker/followed by cardiology -I feel her depression is playing  a role in any residual symptoms.    -she agreed to try low dose lexapro 5mg  qhs   -encouraged increased activity, sunlight, social interaction  25 minutes face to face patient care time were spent during this visit. All questions were encouraged and answered.  I'll see her back in about 2 MONTHS.

## 2016-09-25 ENCOUNTER — Encounter: Payer: Self-pay | Admitting: Internal Medicine

## 2016-09-25 ENCOUNTER — Ambulatory Visit (HOSPITAL_BASED_OUTPATIENT_CLINIC_OR_DEPARTMENT_OTHER): Payer: PPO

## 2016-09-25 MED ORDER — SUCRALFATE 1 GM/10ML PO SUSP: 1.0000 g | Freq: Three times a day (TID) | ORAL | 0 refills | Status: DC

## 2016-09-25 NOTE — Telephone Encounter (Signed)
send carafate : 1 or 2 teaspoons before meals. #400 mL, no refills  Received: Today  Message Contents  Colon Branch, MD  Damita Dunnings, [place]

## 2016-09-25 NOTE — Telephone Encounter (Signed)
Rx sent 

## 2016-09-26 ENCOUNTER — Ambulatory Visit (HOSPITAL_BASED_OUTPATIENT_CLINIC_OR_DEPARTMENT_OTHER)
Admission: RE | Admit: 2016-09-26 | Discharge: 2016-09-26 | Disposition: A | Discharge status: Home / Self Care | Payer: PPO | Source: Ambulatory Visit | Attending: Internal Medicine | Admitting: Internal Medicine

## 2016-09-26 DIAGNOSIS — R1013 Epigastric pain: Secondary | ICD-10-CM | POA: Diagnosis present

## 2016-09-26 DIAGNOSIS — K7689 Other specified diseases of liver: Secondary | ICD-10-CM | POA: Insufficient documentation

## 2016-10-02 ENCOUNTER — Other Ambulatory Visit: Payer: Self-pay | Admitting: Neurology

## 2016-10-05 ENCOUNTER — Ambulatory Visit (INDEPENDENT_AMBULATORY_CARE_PROVIDER_SITE_OTHER): Payer: PPO | Admitting: Gastroenterology

## 2016-10-05 ENCOUNTER — Encounter: Payer: Self-pay | Admitting: Gastroenterology

## 2016-10-05 VITALS — BP 100/60 | HR 72 | Ht 65.0 in | Wt 115.0 lb

## 2016-10-05 DIAGNOSIS — R634 Abnormal weight loss: Secondary | ICD-10-CM

## 2016-10-05 DIAGNOSIS — R1013 Epigastric pain: Secondary | ICD-10-CM | POA: Diagnosis not present

## 2016-10-05 DIAGNOSIS — R630 Anorexia: Secondary | ICD-10-CM | POA: Diagnosis not present

## 2016-10-05 DIAGNOSIS — Z86010 Personal history of colonic polyps: Secondary | ICD-10-CM

## 2016-10-05 NOTE — Patient Instructions (Signed)
You have been scheduled for an endoscopy. Please follow written instructions given to you at your visit today. If you use inhalers (even only as needed), please bring them with you on the day of your procedure. Your physician has requested that you go to www.startemmi.com and enter the access code given to you at your visit today. This web site gives a general overview about your procedure. However, you should still follow specific instructions given to you by our office regarding your preparation for the procedure.  You have been scheduled for a CT scan of the abdomen and pelvis at [facility] ([address]---this is in the same building as Press photographer).   You are scheduled on 10-08-16 at 9:30am. You should arrive 15 minutes prior to your appointment time for registration. Please follow the written instructions below on the day of your exam:  WARNING: IF YOU ARE ALLERGIC TO IODINE/X-RAY DYE, PLEASE NOTIFY RADIOLOGY IMMEDIATELY AT [phone]! YOU WILL BE GIVEN A 13 HOUR PREMEDICATION PREP.  1) Do not eat or drink anything after 5:30am (4 hours prior to your test) 2) You have been given 2 bottles of oral contrast to drink. The solution may taste               better if refrigerated, but do NOT add ice or any other liquid to this solution. Shake             well before drinking.    Drink 1 bottle of contrast @ 7:30am (2 hours prior to your exam)  Drink 1 bottle of contrast @ 8:30am (1 hour prior to your exam)  You may take any medications as prescribed with a small amount of water except for the following: Metformin, Glucophage, Glucovance, Avandamet, Riomet, Fortamet, Actoplus Met, Janumet, Glumetza or Metaglip. The above medications must be held the day of the exam AND 48 hours after the exam.  The purpose of you drinking the oral contrast is to aid in the visualization of your intestinal tract. The contrast solution may cause some diarrhea. Before your exam is started, you  will be given a small amount of fluid to drink. Depending on your individual set of symptoms, you may also receive an intravenous injection of x-ray contrast/dye. Plan on being at [facility] for 30 minutes or longer, depending on the type of exam you are having performed.  This test typically takes 30-45 minutes to complete.  If you have any questions regarding your exam or if you need to reschedule, you may call the CT department at [phone] between the hours of 8:00 am and 5:00 pm, Monday-Friday.  ________________________________________________________________________  Normal BMI (Body Mass Index- based on height and weight) is between 23 and 30. Your BMI today is Body mass index is 19.14 kg/m. Marland Kitchen Please consider follow up  regarding your BMI with your Primary Care Provider.  Thank you for choosing me and [facility].  Pricilla Riffle. Dagoberto Ligas., MD., Marval Regal

## 2016-10-05 NOTE — Progress Notes (Signed)
History of Present Illness: This is a 71 year old female referred by Shelda Pal* for the evaluation of epigastric pain, weight loss, anorexia. She is being treated for depression. She was started on pantoprazole and Carafate with no significant change in symptoms. She states about 10-12 pound weight loss. CBC and CMP unremarkable in 09/2016. Hemocult negative stool in 04/2016. Abdominal ultrasound below. She underwent colonoscopy in October 2007 with piecemeal removal of a 25 mm transverse colon polyp. Pathology was a serrated adenoma. She was recommended to return in one year or colonoscopy however she did not return. Denies constipation, diarrhea, change in stool caliber, melena, hematochezia, nausea, vomiting, dysphagia, reflux symptoms, chest pain.  Abd [place] 09/2016 IMPRESSION: No acute findings. Minimally complex right posterior hepatic cyst.  No Known Allergies Outpatient Medications Prior to Visit  Medication Sig Dispense Refill  . acetaminophen (TYLENOL) 500 MG tablet Take 500 mg by mouth every 6 (six) hours as needed.    . ALPRAZolam (XANAX) 0.25 MG tablet TAKE 1 TO 2 TABLETS BY MOUTH AT BEDTIME AS NEEDED FOR ANXIETY OR SLEEP 60 tablet 0  . carbidopa-levodopa (SINEMET IR) 25-100 MG tablet TAKE ONE TABLET BY MOUTH THREE TIMES DAILY 90 tablet 2  . escitalopram (LEXAPRO) 5 MG tablet Take 1 tablet (5 mg total) by mouth daily. 30 tablet 3  . ezetimibe (ZETIA) 10 MG tablet TAKE ONE TABLET BY MOUTH ONCE DAILY 30 tablet 5  . ipratropium (ATROVENT) 0.06 % nasal spray Place 1-3 sprays into both nostrils 3 (three) times daily.     . metoprolol tartrate (LOPRESSOR) 25 MG tablet TAKE ONE TABLET BY MOUTH TWICE DAILY 180 tablet 2  . midodrine (PROAMATINE) 2.5 MG tablet TAKE 1 TABLET BY MOUTH 3 TIMES DAILY WITH MEALS. 90 tablet 7  . ondansetron (ZOFRAN) 4 MG tablet Take 1 tablet (4 mg total) by mouth every 8 (eight) hours as needed for nausea or vomiting. 20 tablet 0  . pantoprazole  (PROTONIX) 40 MG tablet Take 1 tablet (40 mg total) by mouth 2 (two) times daily. 60 tablet 3  . pyridostigmine (MESTINON) 60 MG tablet TAKE 1 AND 1/2 TABLETS BYMOUTH THREE TIMES A DAY 270 tablet 3  . sucralfate (CARAFATE) 1 GM/10ML suspension Take 10-20 mLs (1-2 g total) by mouth 3 (three) times daily with meals. 420 mL 0  . topiramate (TOPAMAX) 25 MG tablet Take 1 tablet (25 mg total) by mouth daily. 30 tablet 5   No facility-administered medications prior to visit.    Past Medical History:  Diagnosis Date  . Anxiety state, unspecified   . Hyperlipidemia   . Hypertension   . Insomnia   . LBBB (left bundle branch block)    LHC in 2002 showed normal coronaries.   . Osteoarthrosis, unspecified whether generalized or localized, unspecified site   . Overweight(278.02)   . S/P placement of cardiac pacemaker    a. 02/21/16: Medtronic Advisa DR MRI SureScan model [id number] (serial number [id number])   . Serrated adenoma of colon 2007  . Symptomatic menopausal or female climacteric states   . Syncope and collapse    11/12: Holter 12/12 with rare PACS, HR range 64-140, average 82, no significant arrhythmias. Echo (1/13): EF 50-55%, mild LVH, septal-lateral dyssynchrony c/w LBBB. 3-week event monitor (1/13): No significant arrhythmia.    Past Surgical History:  Procedure Laterality Date  . EP IMPLANTABLE DEVICE N/A 02/21/2016   Procedure: Pacemaker Implant;  Surgeon: Will Meredith Leeds, MD;  Location: [facility];  Service: Cardiovascular;  Laterality: N/A;  . TUBAL LIGATION     Social History   Social History  . Marital status: Married    Spouse name: [name]  . Number of children: 2  . Years of education: N/A   Occupational History  . retired Education administrator   Social History Main Topics  . Smoking status: Never Smoker  . Smokeless tobacco: Never Used  . Alcohol use No  . Drug use: No  . Sexual activity: Not Asked   Other Topics Concern  .  None   Social History Narrative   HSG.  Married '65.  2 daughters, '71, '77; 4 grandchildren            Family History  Problem Relation Age of Onset  . Heart failure Mother   . Coronary artery disease Mother   . Dementia Mother   . Diabetes Mother   . Colon cancer Neg Hx   . Breast cancer Neg Hx   . Hypertension Neg Hx   . Heart disease Neg Hx   . Heart attack Neg Hx   . Stroke Neg Hx       Review of Systems: Pertinent positive and negative review of systems were noted in the above HPI section. All other review of systems were otherwise negative.   Physical Exam: General: Well developed, well nourished, no acute distress Head: Normocephalic and atraumatic Eyes:  sclerae anicteric, EOMI Ears: Normal auditory acuity Mouth: No deformity or lesions Neck: Supple, no masses or thyromegaly Lungs: Clear throughout to auscultation Heart: Regular rate and rhythm; no murmurs, rubs or bruits Abdomen: Soft, non tender and non distended. No masses, hepatosplenomegaly or hernias noted. Normal Bowel sounds Rectal: Musculoskeletal: Symmetrical with no gross deformities  Skin: No lesions on visible extremities Pulses:  Normal pulses noted Extremities: No clubbing, cyanosis, edema or deformities noted Neurological: Alert oriented x 4, grossly nonfocal Cervical Nodes:  No significant cervical adenopathy Inguinal Nodes: No significant inguinal adenopathy Psychological:  Alert and cooperative. Normal mood and affect  Assessment and Recommendations:  1. Epigastric pain, weight loss, anorexia. R/O ulcer, neoplasm, partial GOO. Schedule EGD and abd/pelvic CT. Continue pantoprazole bid and Carafate tid for now. Avoid ASA/NSAID products. The risks (including bleeding, perforation, infection, missed lesions, medication reactions and possible hospitalization or surgery if complications occur), benefits, and alternatives to endoscopy with possible biopsy and possible dilation were discussed with  the patient and they consent to proceed.   2. History of precancerous colon polyp in October 2007. She is overdue for colonoscopy. She does not feel she can tolerate taking a bowel prep at this time so colonoscopy will be scheduled at a later date.   cc: Shelda Pal, DO [place], [place] [id number]

## 2016-10-06 ENCOUNTER — Ambulatory Visit (AMBULATORY_SURGERY_CENTER): Payer: PPO | Admitting: Gastroenterology

## 2016-10-06 ENCOUNTER — Encounter: Payer: Self-pay | Admitting: Gastroenterology

## 2016-10-06 VITALS — BP 168/81 | HR 60 | Temp 97.3°F | Resp 13 | Ht 65.0 in | Wt 115.0 lb

## 2016-10-06 DIAGNOSIS — R1013 Epigastric pain: Secondary | ICD-10-CM

## 2016-10-06 DIAGNOSIS — I10 Essential (primary) hypertension: Secondary | ICD-10-CM | POA: Diagnosis not present

## 2016-10-06 DIAGNOSIS — K317 Polyp of stomach and duodenum: Secondary | ICD-10-CM | POA: Diagnosis not present

## 2016-10-06 DIAGNOSIS — R634 Abnormal weight loss: Secondary | ICD-10-CM

## 2016-10-06 DIAGNOSIS — R109 Unspecified abdominal pain: Secondary | ICD-10-CM | POA: Diagnosis not present

## 2016-10-06 DIAGNOSIS — I447 Left bundle-branch block, unspecified: Secondary | ICD-10-CM | POA: Diagnosis not present

## 2016-10-06 MED ORDER — SODIUM CHLORIDE 0.9 % IV SOLN: 500.0000 mL | INTRAVENOUS | Status: DC

## 2016-10-06 MED ORDER — ONDANSETRON HCL 4 MG PO TABS: 4.0000 mg | ORAL_TABLET | Freq: Three times a day (TID) | ORAL | 0 refills | Status: DC | PRN

## 2016-10-06 MED ORDER — METOCLOPRAMIDE HCL 5 MG PO TABS: 5.0000 mg | ORAL_TABLET | Freq: Three times a day (TID) | ORAL | 0 refills | Status: DC

## 2016-10-06 NOTE — Progress Notes (Signed)
Called to room to assist during endoscopic procedure.  Patient ID and intended procedure confirmed with present staff. Received instructions for my participation in the procedure from the performing physician.  

## 2016-10-06 NOTE — Patient Instructions (Signed)
Take your Zofran 30 minutes before your meals. New prescription: Metoclopramide, this medication has been sent electronically to your [facility].take this medication 30 minutes prior to your meals.   YOU HAD AN ENDOSCOPIC PROCEDURE TODAY AT [facility]:   Refer to the procedure report that was given to you for any specific questions about what was found during the examination.  If the procedure report does not answer your questions, please call your gastroenterologist to clarify.  If you requested that your care partner not be given the details of your procedure findings, then the procedure report has been included in a sealed envelope for you to review at your convenience later.  YOU SHOULD EXPECT: Some feelings of bloating in the abdomen. Passage of more gas than usual.  Walking can help get rid of the air that was put into your GI tract during the procedure and reduce the bloating. If you had a lower endoscopy (such as a colonoscopy or flexible sigmoidoscopy) you may notice spotting of blood in your stool or on the toilet paper. If you underwent a bowel prep for your procedure, you may not have a normal bowel movement for a few days.  Please Note:  You might notice some irritation and congestion in your nose or some drainage.  This is from the oxygen used during your procedure.  There is no need for concern and it should clear up in a day or so.  SYMPTOMS TO REPORT IMMEDIATELY:    Following upper endoscopy (EGD)  Vomiting of blood or coffee ground material  New chest pain or pain under the shoulder blades  Painful or persistently difficult swallowing  New shortness of breath  Fever of 100F or higher  Black, tarry-looking stools  For urgent or emergent issues, a gastroenterologist can be reached at any hour by calling [phone].   DIET:  We do recommend a small meal at first, but then you may proceed to your regular diet.  Drink plenty of fluids but you should  avoid alcoholic beverages for 24 hours.  ACTIVITY:  You should plan to take it easy for the rest of today and you should NOT DRIVE or use heavy machinery until tomorrow (because of the sedation medicines used during the test).    FOLLOW UP: Our staff will call the number listed on your records the next business day following your procedure to check on you and address any questions or concerns that you may have regarding the information given to you following your procedure. If we do not reach you, we will leave a message.  However, if you are feeling well and you are not experiencing any problems, there is no need to return our call.  We will assume that you have returned to your regular daily activities without incident.  If any biopsies were taken you will be contacted by phone or by letter within the next 1-3 weeks.  Please call us at [phone] if you have not heard about the biopsies in 3 weeks.    SIGNATURES/CONFIDENTIALITY: You and/or your care partner have signed paperwork which will be entered into your electronic medical record.  These signatures attest to the fact that that the information above on your After Visit Summary has been reviewed and is understood.  Full responsibility of the confidentiality of this discharge information lies with you and/or your care-partner.

## 2016-10-06 NOTE — Op Note (Signed)
[facility] Patient Name: Mandy Durham Procedure Date: 10/06/2016 3:52 PM MRN: [id number] Endoscopist: Ladene Artist , MD Age: 71 Referring MD:  Date of Birth: Mandy Durham 12, 1947 Gender: Female Account #: [id number] Procedure:                Upper GI endoscopy Indications:              Epigastric abdominal pain, Anorexia, Weight loss Medicines:                Monitored Anesthesia Care Procedure:                Pre-Anesthesia Assessment:                           - Prior to the procedure, a History and Physical                            was performed, and patient medications and                            allergies were reviewed. The patient's tolerance of                            previous anesthesia was also reviewed. The risks                            and benefits of the procedure and the sedation                            options and risks were discussed with the patient.                            All questions were answered, and informed consent                            was obtained. Prior Anticoagulants: The patient has                            taken no previous anticoagulant or antiplatelet                            agents. ASA Grade Assessment: II - A patient with                            mild systemic disease. After reviewing the risks                            and benefits, the patient was deemed in                            satisfactory condition to undergo the procedure.                           After obtaining informed consent, the endoscope was  passed under direct vision. Throughout the                            procedure, the patient's blood pressure, pulse, and                            oxygen saturations were monitored continuously. The                            Endoscope was introduced through the mouth, and                            advanced to the second part of duodenum. The upper                            GI  endoscopy was accomplished without difficulty.                            The patient tolerated the procedure well. Scope In: Scope Out: Findings:                 The examined esophagus was normal.                           A few 3 to 4 mm sessile polyps with no bleeding and                            no stigmata of recent bleeding were found in the                            gastric fundus and in the gastric body. Biopsies                            were taken with a cold forceps for histology.                           The exam of the stomach was otherwise normal.                           Patchy mildly erythematous mucosa without active                            bleeding and with no stigmata of bleeding was found                            in the duodenal bulb.                           The second portion of the duodenum was normal. Complications:            No immediate complications. Estimated Blood Loss:     Estimated blood loss was minimal. Impression:               - Normal esophagus.                           -  A few gastric polyps. Biopsied.                           - Erythematous duodenopathy.                           - Normal second portion of the duodenum. Recommendation:           - Patient has a contact number available for                            emergencies. The signs and symptoms of potential                            delayed complications were discussed with the                            patient. Return to normal activities tomorrow.                            Written discharge instructions were provided to the                            patient.                           - Resume previous diet.                           - Continue present medications.                           - Await pathology results.                           - Metoclopramide 5 mg PO QID; 30 min AC and HS for                            1 month, no refills. Ladene Artist, MD 10/06/2016  4:12:39 PM This report has been signed electronically.

## 2016-10-06 NOTE — Progress Notes (Signed)
Report given to PACU, vss 

## 2016-10-07 ENCOUNTER — Telehealth: Payer: Self-pay | Admitting: *Deleted

## 2016-10-07 ENCOUNTER — Other Ambulatory Visit: Payer: Self-pay | Admitting: Neurology

## 2016-10-07 NOTE — Telephone Encounter (Signed)
  Follow up Call-  Call back number 10/06/2016  Post procedure Call Back phone  # [phone]  Permission to leave phone message Yes  Some recent data might be hidden     Patient questions:  Do you have a fever, pain , or abdominal swelling? No. Pain Score  0 *  Have you tolerated food without any problems? Yes.    Have you been able to return to your normal activities? Yes.    Do you have any questions about your discharge instructions: Diet   No. Medications  Yes.   Follow up visit  No.  Do you have questions or concerns about your Care? Yes.    Actions: * If pain score is 4 or above: No action needed, pain <4.  Pt is unsure what her medication is prescribed for.  She has looked up possible side effects and is concerned about starting medication.  Please advise.

## 2016-10-07 NOTE — Telephone Encounter (Signed)
She was advised to take Zofran 8 mg tid ac She was advised to start Reglan 5 mg ac and hs. Short term side effects should not be a problem however she does not need to take it if she doesn't want to.  Keep appt for CT scan

## 2016-10-07 NOTE — Telephone Encounter (Signed)
Patient notified of all recommendations All questions answered

## 2016-10-08 ENCOUNTER — Ambulatory Visit (INDEPENDENT_AMBULATORY_CARE_PROVIDER_SITE_OTHER)
Admission: RE | Admit: 2016-10-08 | Discharge: 2016-10-08 | Disposition: A | Discharge status: Home / Self Care | Payer: PPO | Source: Ambulatory Visit | Attending: Gastroenterology | Admitting: Gastroenterology

## 2016-10-08 DIAGNOSIS — R634 Abnormal weight loss: Secondary | ICD-10-CM | POA: Diagnosis not present

## 2016-10-08 DIAGNOSIS — R1013 Epigastric pain: Secondary | ICD-10-CM | POA: Diagnosis not present

## 2016-10-08 DIAGNOSIS — R109 Unspecified abdominal pain: Secondary | ICD-10-CM | POA: Diagnosis not present

## 2016-10-08 MED ORDER — IOPAMIDOL (ISOVUE-300) INJECTION 61%
100.0000 mL | Freq: Once | INTRAVENOUS | Status: AC | PRN
  Administered 2016-10-08: 100 mL via INTRAVENOUS

## 2016-10-09 ENCOUNTER — Other Ambulatory Visit (INDEPENDENT_AMBULATORY_CARE_PROVIDER_SITE_OTHER): Payer: PPO

## 2016-10-09 ENCOUNTER — Other Ambulatory Visit: Payer: Self-pay

## 2016-10-09 DIAGNOSIS — K859 Acute pancreatitis without necrosis or infection, unspecified: Secondary | ICD-10-CM | POA: Diagnosis not present

## 2016-10-09 LAB — LIPASE: Lipase: 20 U/L (ref 11.0–59.0)

## 2016-10-09 LAB — AMYLASE: AMYLASE: 27 U/L (ref 27–131)

## 2016-10-09 MED ORDER — TRAMADOL HCL 50 MG PO TABS: 50.0000 mg | ORAL_TABLET | Freq: Three times a day (TID) | ORAL | 0 refills | Status: DC | PRN

## 2016-10-09 NOTE — Progress Notes (Signed)
Tramasd

## 2016-10-12 ENCOUNTER — Encounter: Payer: Self-pay | Admitting: Gastroenterology

## 2016-10-12 ENCOUNTER — Telehealth: Payer: Self-pay | Admitting: Gastroenterology

## 2016-10-12 NOTE — Telephone Encounter (Signed)
All questions answered I mailed her a copy of a full liquid diet

## 2016-10-15 ENCOUNTER — Telehealth: Payer: Self-pay | Admitting: Gastroenterology

## 2016-10-15 NOTE — Telephone Encounter (Signed)
The pt was given the information per Dr Lynne Leader note   Notes recorded by Ladene Artist, MD on 10/09/2016 at 9:11 AM EDT See impression-suspected mild pancreatitis Amylase and lipase today Clear liquid diet for next 2 days and advance as tolerated

## 2016-10-18 ENCOUNTER — Other Ambulatory Visit: Payer: Self-pay | Admitting: Family

## 2016-10-19 NOTE — Telephone Encounter (Signed)
Refill request, last ov 10/05/16, last refill #[phone]. Please advise. LB

## 2016-10-20 ENCOUNTER — Telehealth: Payer: Self-pay | Admitting: Gastroenterology

## 2016-10-20 NOTE — Telephone Encounter (Signed)
Rx printed, awaiting MD signature.  

## 2016-10-20 NOTE — Telephone Encounter (Signed)
Pt is requesting refill on alprazolam 0.25mg .  Last OV: 09/21/2016 Last Fill: 09/10/2016 #60 and 0RF by Lenna Sciara UDS: 07/22/2015 Low risk  [place] Controlled Database printed 09/10/2016 (in media), no issues noted   Please advise.

## 2016-10-20 NOTE — Telephone Encounter (Signed)
Ok 60 and 1 RF 

## 2016-10-20 NOTE — Telephone Encounter (Signed)
Rx faxed to [facility]  

## 2016-10-21 NOTE — Telephone Encounter (Signed)
I am sorry to hear she is not feeling well. Dr. Fuller Plan is out this week, but it sounds like she needs to see him early next week to revisit this pancreatitis diagnosis. I reviewed the CT scan report, recent labs and EGD report.  It sounds like she is at least able to take oral fluids , so she is not likely to be dehydrated. Meanwhile, I recommend she take the tramadol as prescribed to help her eat.   Boost or ensure supplements 3-4 times daily to get enough nutrition.

## 2016-10-21 NOTE — Telephone Encounter (Signed)
Left a message for patient to call back. 

## 2016-10-21 NOTE — Telephone Encounter (Signed)
Spoke with patient and gave her recommendations. Scheduled with Ellouise Newer on 10/27/16 at 1:15 PM.( no appt with Dr. Fuller Plan)

## 2016-10-21 NOTE — Telephone Encounter (Signed)
Spoke with patient and she states she is still on a clear liquid diet for the pancreatitis and she is not feeling any better. States she is having pain when she eats. Reports she had lemonade, soup and Nab crackers yesterday. That was all she could eat. Feels she is losing weight also. She is not taking the Tramadol, only taking Tylenol for pain. She is not taking Protonox at this time. Report she is voiding, pale yellow urine. Please,advise.

## 2016-10-23 ENCOUNTER — Telehealth: Payer: Self-pay | Admitting: Gastroenterology

## 2016-10-23 NOTE — Telephone Encounter (Signed)
The pt states she has continued pain and nausea she  would like to know if she can take zofran and tramadol on scheduled basis instead of PRN.  I did advise she can take scheduled as directed and also advised if her pain worsens or if she develops further symptoms go to the ED to be evaluated

## 2016-10-27 ENCOUNTER — Ambulatory Visit (INDEPENDENT_AMBULATORY_CARE_PROVIDER_SITE_OTHER): Payer: PPO | Admitting: Physician Assistant

## 2016-10-27 ENCOUNTER — Encounter: Payer: Self-pay | Admitting: Physician Assistant

## 2016-10-27 ENCOUNTER — Encounter (HOSPITAL_COMMUNITY): Payer: Self-pay | Admitting: Emergency Medicine

## 2016-10-27 ENCOUNTER — Emergency Department (HOSPITAL_COMMUNITY)
Admission: EM | Admit: 2016-10-27 | Discharge: 2016-10-27 | Disposition: A | Discharge status: Home / Self Care | Payer: PPO | Attending: Emergency Medicine | Admitting: Emergency Medicine

## 2016-10-27 VITALS — BP 132/84 | HR 62 | Ht 65.0 in | Wt 111.2 lb

## 2016-10-27 DIAGNOSIS — Z79899 Other long term (current) drug therapy: Secondary | ICD-10-CM | POA: Insufficient documentation

## 2016-10-27 DIAGNOSIS — R1010 Upper abdominal pain, unspecified: Secondary | ICD-10-CM | POA: Diagnosis not present

## 2016-10-27 DIAGNOSIS — K859 Acute pancreatitis without necrosis or infection, unspecified: Secondary | ICD-10-CM | POA: Diagnosis not present

## 2016-10-27 DIAGNOSIS — R110 Nausea: Secondary | ICD-10-CM

## 2016-10-27 DIAGNOSIS — R109 Unspecified abdominal pain: Secondary | ICD-10-CM | POA: Diagnosis not present

## 2016-10-27 DIAGNOSIS — I10 Essential (primary) hypertension: Secondary | ICD-10-CM | POA: Insufficient documentation

## 2016-10-27 DIAGNOSIS — Z950 Presence of cardiac pacemaker: Secondary | ICD-10-CM | POA: Diagnosis not present

## 2016-10-27 DIAGNOSIS — R1013 Epigastric pain: Secondary | ICD-10-CM | POA: Diagnosis not present

## 2016-10-27 LAB — URINALYSIS, ROUTINE W REFLEX MICROSCOPIC
Bacteria, UA: NONE SEEN
Bilirubin Urine: NEGATIVE
GLUCOSE, UA: NEGATIVE mg/dL
HGB URINE DIPSTICK: NEGATIVE
KETONES UR: 5 mg/dL — AB
Nitrite: NEGATIVE
PH: 7 (ref 5.0–8.0)
Protein, ur: NEGATIVE mg/dL
RBC / HPF: NONE SEEN RBC/hpf (ref 0–5)
Specific Gravity, Urine: 1.004 — ABNORMAL LOW (ref 1.005–1.030)

## 2016-10-27 LAB — CBC
HCT: 43.1 % (ref 36.0–46.0)
Hemoglobin: 14.5 g/dL (ref 12.0–15.0)
MCH: 27.7 pg (ref 26.0–34.0)
MCHC: 33.6 g/dL (ref 30.0–36.0)
MCV: 82.4 fL (ref 78.0–100.0)
PLATELETS: 272 10*3/uL (ref 150–400)
RBC: 5.23 MIL/uL — ABNORMAL HIGH (ref 3.87–5.11)
RDW: 13.3 % (ref 11.5–15.5)
WBC: 6.2 10*3/uL (ref 4.0–10.5)

## 2016-10-27 LAB — COMPREHENSIVE METABOLIC PANEL
ALT: 13 U/L — AB (ref 14–54)
ANION GAP: 13 (ref 5–15)
AST: 22 U/L (ref 15–41)
Albumin: 4.2 g/dL (ref 3.5–5.0)
Alkaline Phosphatase: 59 U/L (ref 38–126)
BUN: 11 mg/dL (ref 6–20)
CALCIUM: 9.5 mg/dL (ref 8.9–10.3)
CHLORIDE: 98 mmol/L — AB (ref 101–111)
CO2: 28 mmol/L (ref 22–32)
CREATININE: 0.7 mg/dL (ref 0.44–1.00)
Glucose, Bld: 94 mg/dL (ref 65–99)
POTASSIUM: 4 mmol/L (ref 3.5–5.1)
SODIUM: 139 mmol/L (ref 135–145)
Total Bilirubin: 1 mg/dL (ref 0.3–1.2)
Total Protein: 7.2 g/dL (ref 6.5–8.1)

## 2016-10-27 LAB — LIPASE, BLOOD: LIPASE: 26 U/L (ref 11–51)

## 2016-10-27 MED ORDER — SODIUM CHLORIDE 0.9 % IV BOLUS (SEPSIS)
1000.0000 mL | Freq: Once | INTRAVENOUS | Status: AC
  Administered 2016-10-27: 1000 mL via INTRAVENOUS

## 2016-10-27 MED ORDER — PANTOPRAZOLE SODIUM 20 MG PO TBEC: 20.0000 mg | DELAYED_RELEASE_TABLET | Freq: Every day | ORAL | 0 refills | Status: DC

## 2016-10-27 MED ORDER — SODIUM CHLORIDE 0.9 % IV BOLUS (SEPSIS): 2000.0000 mL | Freq: Once | INTRAVENOUS | Status: DC

## 2016-10-27 MED ORDER — SUCRALFATE 1 G PO TABS
1.0000 g | ORAL_TABLET | Freq: Once | ORAL | Status: AC
  Administered 2016-10-27: 1 g via ORAL
  Filled 2016-10-27: qty 1

## 2016-10-27 MED ORDER — METOPROLOL TARTRATE 25 MG PO TABS
25.0000 mg | ORAL_TABLET | Freq: Once | ORAL | Status: AC
  Administered 2016-10-27: 25 mg via ORAL
  Filled 2016-10-27: qty 1

## 2016-10-27 MED ORDER — SODIUM CHLORIDE 0.9 % IV SOLN
INTRAVENOUS | Status: DC
  Administered 2016-10-27: 20:00:00 via INTRAVENOUS

## 2016-10-27 MED ORDER — GI COCKTAIL ~~LOC~~
30.0000 mL | Freq: Once | ORAL | Status: AC
  Administered 2016-10-27: 30 mL via ORAL
  Filled 2016-10-27: qty 30

## 2016-10-27 MED ORDER — SUCRALFATE 1 G PO TABS: 1.0000 g | ORAL_TABLET | Freq: Four times a day (QID) | ORAL | 0 refills | Status: DC

## 2016-10-27 NOTE — ED Triage Notes (Signed)
Pt from home with c/o upper abdominal pain that began on July 1st. Pt states she has been evaluated for this previously and has been diagnosed with pancreatitis. Pt states her pain has gotten progressively worse. Pt denies emesis or diarrhea but endorses nausea.

## 2016-10-27 NOTE — ED Notes (Signed)
Pt ambulated from the room to the restroom and back to the room done well

## 2016-10-27 NOTE — ED Provider Notes (Signed)
[facility] DEPT Provider Note   CSN: [id number] Arrival date & time: 10/27/16  1356     History   Chief Complaint Chief Complaint  Patient presents with  . Abdominal Pain    HPI Mandy Durham is a 71 y.o. female.  71 year old female who has had approximately 6 weeks of abdominal discomfort that is worse after she eats.patient had abdominal CT scan done 2 weeks ago which showed mild pancreatitis. Denies any current fever, vomiting.patient has no abdominal discomfort when not eating. Did have abdominal ultrasound about a month ago which did not show any evidence of cholelithiasis. Passing flatus. Pain when she eats characterized as sharp and she is not taking anything for it. Saw her GI doctor today for similar symptoms and was sent to for further management.      Past Medical History:  Diagnosis Date  . Anxiety state, unspecified   . Hyperlipidemia   . Hypertension   . Insomnia   . LBBB (left bundle branch block)    LHC in 2002 showed normal coronaries.   . Osteoarthrosis, unspecified whether generalized or localized, unspecified site   . Overweight(278.02)   . S/P placement of cardiac pacemaker    a. 02/21/16: Medtronic Advisa DR MRI SureScan model [id number] (serial number [id number])   . Serrated adenoma of colon 2007  . Symptomatic menopausal or female climacteric states   . Syncope and collapse    11/12: Holter 12/12 with rare PACS, HR range 64-140, average 82, no significant arrhythmias. Echo (1/13): EF 50-55%, mild LVH, septal-lateral dyssynchrony c/w LBBB. 3-week event monitor (1/13): No significant arrhythmia.     Patient Active Problem List   Diagnosis Date Noted  . Tremors of nervous system 03/05/2016  . S/P placement of cardiac pacemaker   . Mobitz II 02/20/2016  . Nystagmus, end-position 02/12/2016  . Dizziness and giddiness 02/12/2016  . Flapping tremor 02/12/2016  . PCP NOTES >>>> 02/13/2015  . TBI (traumatic brain injury) ([place]) 01/28/2015  .  Essential hypertension   . Acute post-traumatic headache, not intractable   . Skull fracture ([place])   . Vertigo due to brain injury ([place])   . SAH (subarachnoid hemorrhage) ([place]) 01/21/2015  . Annual physical exam 09/07/2014  . Orthostatic hypotension 08/30/2014  . Acromioclavicular arthrosis 03/21/2014  . Allergic rhinitis 02/27/2014  . SI (sacroiliac) joint dysfunction 02/02/2014  . Gastrocnemius strain, left 12/11/2013  . Back pain 08/22/2013  . LBBB (left bundle branch block) 05/13/2013  . Shingles outbreak 12/15/2012  . Borderline diabetes   . Paresthesia of foot 07/07/2010  . INSOMNIA, CHRONIC, MILD 01/12/2008  . HLD (hyperlipidemia) 02/04/2007  . OSTEOPENIA 02/04/2007  . Anxiety and depression 10/12/2006  . OSTEOARTHRITIS 10/12/2006    Past Surgical History:  Procedure Laterality Date  . EP IMPLANTABLE DEVICE N/A 02/21/2016   Procedure: Pacemaker Implant;  Surgeon: Will Meredith Leeds, MD;  Location: [facility];  Service: Cardiovascular;  Laterality: N/A;  . TUBAL LIGATION      OB History    No data available       Home Medications    Prior to Admission medications   Medication Sig Start Date End Date Taking? Authorizing Provider  acetaminophen (TYLENOL) 500 MG tablet Take 500 mg by mouth every 6 (six) hours as needed.    [provider]  ALPRAZolam Duanne Moron) 0.25 MG tablet Take 1-2 tablets (0.25-0.5 mg total) by mouth at bedtime as needed for anxiety or sleep. 10/20/16   Colon Branch, MD  carbidopa-levodopa (SINEMET IR) 25-100 MG tablet TAKE 1 TABLET BY MOUTH THREE TIMES DAILY 10/07/16   Dohmeier, Asencion Partridge, MD  escitalopram (LEXAPRO) 5 MG tablet Take 1 tablet (5 mg total) by mouth daily. 09/23/16   Meredith Staggers, MD  ezetimibe (ZETIA) 10 MG tablet TAKE ONE TABLET BY MOUTH ONCE DAILY 09/09/16   Shelda Pal, DO  ipratropium (ATROVENT) 0.06 % nasal spray Place 1-3 sprays into both nostrils 3 (three) times daily.  05/04/16   [provider]   metoCLOPramide (REGLAN) 5 MG tablet Take 1 tablet (5 mg total) by mouth 4 (four) times daily -  before meals and at bedtime. Take this medication 30 minutes before meals. 10/06/16   Ladene Artist, MD  metoprolol tartrate (LOPRESSOR) 25 MG tablet TAKE ONE TABLET BY MOUTH TWICE DAILY 01/28/16   Larey Dresser, MD  midodrine (PROAMATINE) 2.5 MG tablet TAKE 1 TABLET BY MOUTH 3 TIMES DAILY WITH MEALS. 03/03/16   Larey Dresser, MD  ondansetron (ZOFRAN) 4 MG tablet Take 1 tablet (4 mg total) by mouth every 8 (eight) hours as needed for nausea or vomiting (take this medication 30 minutes prior to meals). 10/06/16   Ladene Artist, MD  pantoprazole (PROTONIX) 40 MG tablet Take 1 tablet (40 mg total) by mouth 2 (two) times daily. 09/21/16   Colon Branch, MD  pyridostigmine (MESTINON) 60 MG tablet TAKE 1 AND 1/2 TABLETS BYMOUTH THREE TIMES A DAY 08/13/16   Larey Dresser, MD  sucralfate (CARAFATE) 1 GM/10ML suspension Take 10-20 mLs (1-2 g total) by mouth 3 (three) times daily with meals. 09/25/16   Colon Branch, MD  traMADol (ULTRAM) 50 MG tablet Take 1 tablet (50 mg total) by mouth 3 (three) times daily as needed. 10/09/16   Ladene Artist, MD    Family History Family History  Problem Relation Age of Onset  . Heart failure Mother   . Coronary artery disease Mother   . Dementia Mother   . Diabetes Mother   . Colon cancer Neg Hx   . Breast cancer Neg Hx   . Hypertension Neg Hx   . Heart disease Neg Hx   . Heart attack Neg Hx   . Stroke Neg Hx     Social History Social History  Substance Use Topics  . Smoking status: Never Smoker  . Smokeless tobacco: Never Used  . Alcohol use No     Allergies   Patient has no known allergies.   Review of Systems Review of Systems  All other systems reviewed and are negative.    Physical Exam Updated Vital Signs BP (!) 211/107 (BP Location: Right Arm)   Pulse 75   Temp 98.1 F (36.7 C)   Resp 20   Ht 1.651 m (5\' 5" )   Wt 50.3 kg (111  lb)   SpO2 99%   BMI 18.47 kg/m   Physical Exam  Constitutional: She is oriented to person, place, and time. She appears well-developed and well-nourished.  Non-toxic appearance. No distress.  HENT:  Head: Normocephalic and atraumatic.  Eyes: Pupils are equal, round, and reactive to light. Conjunctivae, EOM and lids are normal.  Neck: Normal range of motion. Neck supple. No tracheal deviation present. No thyroid mass present.  Cardiovascular: Normal rate, regular rhythm and normal heart sounds.  Exam reveals no gallop.   No murmur heard. Pulmonary/Chest: Effort normal and breath sounds normal. No stridor. No respiratory distress. She has no decreased breath sounds. She has  no wheezes. She has no rhonchi. She has no rales.  Abdominal: Soft. Normal appearance and bowel sounds are normal. She exhibits no distension. There is no tenderness. There is no rebound and no CVA tenderness.  Musculoskeletal: Normal range of motion. She exhibits no edema or tenderness.  Neurological: She is alert and oriented to person, place, and time. She has normal strength. No cranial nerve deficit or sensory deficit. GCS eye subscore is 4. GCS verbal subscore is 5. GCS motor subscore is 6.  Skin: Skin is warm and dry. No abrasion and no rash noted.  Psychiatric: She has a normal mood and affect. Her speech is normal and behavior is normal.  Nursing note and vitals reviewed.    ED Treatments / Results  Labs (all labs ordered are listed, but only abnormal results are displayed) Labs Reviewed  COMPREHENSIVE METABOLIC PANEL - Abnormal; Notable for the following:       Result Value   Chloride 98 (*)    ALT 13 (*)    All other components within normal limits  CBC - Abnormal; Notable for the following:    RBC 5.23 (*)    All other components within normal limits  LIPASE, BLOOD  URINALYSIS, ROUTINE W REFLEX MICROSCOPIC    EKG  EKG Interpretation None       Radiology No results  found.  Procedures Procedures (including critical care time)  Medications Ordered in ED Medications  0.9 %  sodium chloride infusion (not administered)  metoprolol tartrate (LOPRESSOR) tablet 25 mg (not administered)  gi cocktail (Maalox,Lidocaine,Donnatal) (not administered)  sucralfate (CARAFATE) tablet 1 g (not administered)  sodium chloride 0.9 % bolus 1,000 mL (not administered)     Initial Impression / Assessment and Plan / ED Course  I have reviewed the triage vital signs and the nursing notes.  Pertinent labs & imaging results that were available during my care of the patient were reviewed by me and considered in my medical decision making (see chart for details).     Patient given IV fluids and feels better. Suspect that she has some element of gastritis with her symptoms. Blood pressure treated with her home dose metoprolol. She'll follow-up with her doctor.  Final Clinical Impressions(s) / ED Diagnoses   Final diagnoses:  None    New Prescriptions New Prescriptions   No medications on file     Lacretia Leigh, MD 10/27/16 2152

## 2016-10-27 NOTE — Progress Notes (Signed)
Chief Complaint: Epigastric Pain, Nausea  HPI:  Mandy Durham is a 71 year old female with a past medical history as listed below, who returns to clinic today with continued abdominal pain and nausea.      Please recall patient was last seen in clinic on 10/05/16 for epigastric pain, weight loss and anorexia. She was scheduled for an EGD and CT of the abdomen and pelvis. CT on 10/08/16 revealed findings suspicious for mild acute pancreatitis involving the pancreatic body and tail. 2.7 cm subserosal fibroid arising from the right uterine fundus and colonic diverticulosis without diverticulitis as well as aortic atherosclerosis. Ultrasound prior to this 09/26/16 showed a gallbladder with no gallstones or wall thickening visualized. Lipase and amylase checked 10/09/16 were normal. It was thought patient possibly had resolving pancreatitis and she should remain on a clear liquid diet for 2 days and advance to a low-fat low residue diet.    Patient had EGD 7/94/18 which showed erythematous duodenoscopy and a few gastric polyps.    Today, the patient presents to clinic accompanied by her husband and tells me that her symptoms have worsened since being seen initially at the end of July. She has remained on a clear liquid diet since that time and was initially able to take drinks of water with no problems, but now cannot eat or drink over the past week without resulting cramping/ aching epigastric abdominal pain which she rates as an 8/10. She has been scheduling her Tramadol before meals but this still does not relieve her pain. She has been taking 2 Tylenol after eating which eventually seems to help as well as some Pepto-Bismol. The patient feels weak and even feels "muscle weakness". She is worried that she has been on a liquid diet for almost a month and her symptoms are no better, in fact they are worsening. Patient does use Xanax to sleep, so typically she sleeps quite well. Associated symptoms include chronic  nausea not helped by scheduled Zofran.    Patient denies fever, chills, blood in her stool, melena, vomiting, heartburn or reflux.  Past Medical History:  Diagnosis Date  . Anxiety state, unspecified   . Hyperlipidemia   . Hypertension   . Insomnia   . LBBB (left bundle branch block)    LHC in 2002 showed normal coronaries.   . Osteoarthrosis, unspecified whether generalized or localized, unspecified site   . Overweight(278.02)   . S/P placement of cardiac pacemaker    a. 02/21/16: Medtronic Advisa DR MRI SureScan model [id number] (serial number [id number])   . Serrated adenoma of colon 2007  . Symptomatic menopausal or female climacteric states   . Syncope and collapse    11/12: Holter 12/12 with rare PACS, HR range 64-140, average 82, no significant arrhythmias. Echo (1/13): EF 50-55%, mild LVH, septal-lateral dyssynchrony c/w LBBB. 3-week event monitor (1/13): No significant arrhythmia.     Past Surgical History:  Procedure Laterality Date  . EP IMPLANTABLE DEVICE N/A 02/21/2016   Procedure: Pacemaker Implant;  Surgeon: Will Meredith Leeds, MD;  Location: [facility];  Service: Cardiovascular;  Laterality: N/A;  . TUBAL LIGATION      Current Outpatient Prescriptions  Medication Sig Dispense Refill  . acetaminophen (TYLENOL) 500 MG tablet Take 500 mg by mouth every 6 (six) hours as needed.    . ALPRAZolam (XANAX) 0.25 MG tablet Take 1-2 tablets (0.25-0.5 mg total) by mouth at bedtime as needed for anxiety or sleep. 60 tablet 1  . carbidopa-levodopa (SINEMET  IR) 25-100 MG tablet TAKE 1 TABLET BY MOUTH THREE TIMES DAILY 90 tablet 3  . escitalopram (LEXAPRO) 5 MG tablet Take 1 tablet (5 mg total) by mouth daily. 30 tablet 3  . ezetimibe (ZETIA) 10 MG tablet TAKE ONE TABLET BY MOUTH ONCE DAILY 30 tablet 5  . ipratropium (ATROVENT) 0.06 % nasal spray Place 1-3 sprays into both nostrils 3 (three) times daily.     . metoCLOPramide (REGLAN) 5 MG tablet Take 1 tablet (5 mg total) by  mouth 4 (four) times daily -  before meals and at bedtime. Take this medication 30 minutes before meals. 120 tablet 0  . metoprolol tartrate (LOPRESSOR) 25 MG tablet TAKE ONE TABLET BY MOUTH TWICE DAILY 180 tablet 2  . midodrine (PROAMATINE) 2.5 MG tablet TAKE 1 TABLET BY MOUTH 3 TIMES DAILY WITH MEALS. 90 tablet 7  . ondansetron (ZOFRAN) 4 MG tablet Take 1 tablet (4 mg total) by mouth every 8 (eight) hours as needed for nausea or vomiting (take this medication 30 minutes prior to meals). 20 tablet 0  . pantoprazole (PROTONIX) 40 MG tablet Take 1 tablet (40 mg total) by mouth 2 (two) times daily. 60 tablet 3  . pyridostigmine (MESTINON) 60 MG tablet TAKE 1 AND 1/2 TABLETS BYMOUTH THREE TIMES A DAY 270 tablet 3  . sucralfate (CARAFATE) 1 GM/10ML suspension Take 10-20 mLs (1-2 g total) by mouth 3 (three) times daily with meals. 420 mL 0  . traMADol (ULTRAM) 50 MG tablet Take 1 tablet (50 mg total) by mouth 3 (three) times daily as needed. 40 tablet 0   Current Facility-Administered Medications  Medication Dose Route Frequency Provider Last Rate Last Dose  . 0.9 %  sodium chloride infusion  500 mL Intravenous Continuous Ladene Artist, MD        Allergies as of 10/27/2016  . (No Known Allergies)    Family History  Problem Relation Age of Onset  . Heart failure Mother   . Coronary artery disease Mother   . Dementia Mother   . Diabetes Mother   . Colon cancer Neg Hx   . Breast cancer Neg Hx   . Hypertension Neg Hx   . Heart disease Neg Hx   . Heart attack Neg Hx   . Stroke Neg Hx     Social History   Social History  . Marital status: Married    Spouse name: [name]  . Number of children: 2  . Years of education: N/A   Occupational History  . retired Education administrator   Social History Main Topics  . Smoking status: Never Smoker  . Smokeless tobacco: Never Used  . Alcohol use No  . Drug use: No  . Sexual activity: Not on file   Other Topics  Concern  . Not on file   Social History Narrative   HSG.  Married '65.  2 daughters, '71, '77; 4 grandchildren             Review of Systems:    Constitutional: No fever or chills Cardiovascular: No chest pain Respiratory: No SOB  Gastrointestinal: See HPI and otherwise negative   Physical Exam:  Vital signs: BP 132/84 (BP Location: Left Arm, Patient Position: Sitting, Cuff Size: Normal)   Pulse 62   Ht 5\' 5"  (1.651 m)   Wt 111 lb 3.2 oz (50.4 kg)   BMI 18.50 kg/m   Constitutional:   Pleasant frail-ill appearing elderly Caucasian female appears to be in mild  distress, Well developed, alert and cooperative, Increased skin turgor, dry oral mucosa Respiratory: Respirations even and unlabored. Lungs clear to auscultation bilaterally.   No wheezes, crackles, or rhonchi.  Cardiovascular: Normal S1, S2. No MRG. Regular rate and rhythm. No peripheral edema, cyanosis or pallor.  Gastrointestinal:  Soft, nondistended, moderate ttp in the epigastrum No rebound or guarding. Normal bowel sounds. No appreciable masses or hepatomegaly. Psychiatric: Demonstrates good judgement and reason without abnormal affect or behaviors.  MOST RECENT LABS AND IMAGING: CBC    Component Value Date/Time   WBC 6.2 09/21/2016 1450   RBC 4.84 09/21/2016 1450   HGB 13.8 09/21/2016 1450   HGB 14.2 10/28/2015 1350   HCT 41.4 09/21/2016 1450   HCT 44.1 10/28/2015 1350   PLT 287.0 09/21/2016 1450   PLT 337 10/28/2015 1350   MCV 85.6 09/21/2016 1450   MCV 86 10/28/2015 1350   MCH 28.1 02/20/2016 1449   MCHC 33.2 09/21/2016 1450   RDW 13.7 09/21/2016 1450   RDW 14.2 10/28/2015 1350   LYMPHSABS 1.6 09/21/2016 1450   MONOABS 0.4 09/21/2016 1450   EOSABS 0.1 09/21/2016 1450   BASOSABS 0.1 09/21/2016 1450    CMP     Component Value Date/Time   NA 139 09/21/2016 1450   NA 142 10/28/2015 1350   K 4.7 09/21/2016 1450   CL 106 09/21/2016 1450   CO2 30 09/21/2016 1450   GLUCOSE 90 09/21/2016 1450   BUN  15 09/21/2016 1450   BUN 12 10/28/2015 1350   CREATININE 0.91 09/21/2016 1450   CALCIUM 9.5 09/21/2016 1450   PROT 6.8 09/21/2016 1450   PROT 6.7 10/28/2015 1350   ALBUMIN 4.0 09/21/2016 1450   ALBUMIN 3.9 10/28/2015 1350   AST 18 09/21/2016 1450   ALT 14 09/21/2016 1450   ALKPHOS 67 09/21/2016 1450   BILITOT 0.5 09/21/2016 1450   BILITOT 0.3 10/28/2015 1350   GFRNONAA >60 02/21/2016 0224   GFRAA >60 02/21/2016 0224   ABDOMEN ULTRASOUND COMPLETE  09/26/16  COMPARISON:  None.  FINDINGS: Gallbladder: No gallstones or wall thickening visualized. No sonographic Murphy sign noted by sonographer.  Common bile duct: Diameter: Normal caliber, 2 mm  Liver: Mildly complex right posterior cyst with thin internal septation measuring up to 2.5 cm.  Normal echotexture.  IVC: No abnormality visualized.  Pancreas: Visualized portion unremarkable.  Spleen: Size and appearance within normal limits.  Right Kidney: Length: 10.2 cm. Echogenicity within normal limits. No mass or hydronephrosis visualized.  Left Kidney: Length: 9.7 cm. Echogenicity within normal limits. No mass or hydronephrosis visualized.  Abdominal aorta: No aneurysm visualized.  Other findings: None.  IMPRESSION: No acute findings.  Minimally complex right posterior hepatic cyst.   Electronically Signed   By: Rolm Baptise M.D.   On: 09/26/2016 10:58  CT ABDOMEN AND PELVIS WITH CONTRAST 10/08/16  TECHNIQUE: Multidetector CT imaging of the abdomen and pelvis was performed using the standard protocol following bolus administration of intravenous contrast.  CONTRAST:  14mL ISOVUE-300 IOPAMIDOL (ISOVUE-300) INJECTION 61%  COMPARISON:  None.  FINDINGS: Lower Chest: No acute findings.  Hepatobiliary: No hepatic masses identified. 2 cm right hepatic lobe cyst noted. Focal fatty infiltration also seen adjacent falciform ligament. Gallbladder is unremarkable.  Pancreas: Swelling and  decreased enhancement of the pancreatic body and tail is seen with mild peripancreatic soft tissue stranding. This is suspicious for mild acute pancreatitis. No focal pancreatic mass seen. No evidence of pancreatic ductal dilatation. No peripancreatic fluid collections.  Spleen: Within normal  limits in size and appearance.  Adrenals/Urinary Tract: No masses identified. No evidence of hydronephrosis.  Stomach/Bowel: No evidence of obstruction, inflammatory process or abnormal fluid collections. Normal appendix visualized. Colonic diverticulosis, without radiographic evidence of diverticulitis.  Vascular/Lymphatic: No pathologically enlarged lymph nodes. No abdominal aortic aneurysm. Aortic atherosclerosis.  Reproductive: A subserosal fibroid is seen arising from the right uterine fundus which measures 2.7 cm. No other pelvic masses or abnormal fluid collections identified.  Other:  None.  Musculoskeletal: No suspicious bone lesions identified. Severe lumbar degenerative disc disease noted as well as lumbar dextroscoliosis.  IMPRESSION: Findings suspicious for mild acute pancreatitis involving the pancreatic body and tail. Recommend correlation with serum amylase and lipase levels.  2.7 cm subserosal fibroid arising from the right uterine fundus.  Colonic diverticulosis, without radiographic evidence of diverticulitis.  Aortic atherosclerosis.   Electronically Signed   By: Earle Gell M.D.   On: 10/08/2016 13:58  Assessment: 1. Pancreatitis:mild seen at time of last CT 10/08/16-recent normal EGD, Amylase/Lipase normal at time of last check, symptoms increased/worsened and pt with inability to tolerate PO intake x3-4 days; high suspicion for worsened/unresolved pancreatitis + dehydration 2. Epigastric Pain: with above 3. Nausea:with above, un-helped by scheduled Zofran 4. Dehydration:as evidenced by dry mucous membranes and increased skin turgor at time of  exam, likely due to decreased oral intake  Plan: 1. Discussed with patient that since she is no longer able to eat or drink at home without epigastric abdominal pain which is now constant and associated with nausea and she has grown weaker and appears dehydrated time of exam today, recommend that she proceed to the ER for further evaluation. Explained that there they can do a repeat CT abdomen and pelvis with contrast as well as order labs to check for dehydration and electrolyte abnormalities. They can also recheck her lipase and give her fluids and IV pain medicine 2. Did discuss case with Alonza Bogus, PA-C, she will be seeing patient in hospital if admitted 3. Patient to follow in clinic after recommendations from ER visit above with myself or Dr. Royston Cowper, PA-C [facility] 10/27/2016, 1:35 PM  Cc: Colon Branch, MD

## 2016-10-27 NOTE — Patient Instructions (Signed)
Please proceed to the [facility] Emergency room for evaluation and treatment.

## 2016-10-27 NOTE — Progress Notes (Signed)
Reviewed and agree with initial management plan.  Taisia Fantini T. Lonnie Rosado, MD FACG 

## 2016-10-27 NOTE — ED Notes (Signed)
pt

## 2016-10-28 ENCOUNTER — Telehealth: Payer: Self-pay | Admitting: Physician Assistant

## 2016-10-28 NOTE — Telephone Encounter (Signed)
Pt states she was seen in the ED and has a question about her diet.  She was advised to advance diet as tolerated.  She will drink to stay hydrated.  We also discussed her medications and she is aware not to make any changes without seeing Dr Fuller Plan or her PCP first.  Pt agreed and will call if she develops any symptoms

## 2016-11-02 ENCOUNTER — Encounter: Payer: Self-pay | Admitting: Internal Medicine

## 2016-11-02 ENCOUNTER — Ambulatory Visit (INDEPENDENT_AMBULATORY_CARE_PROVIDER_SITE_OTHER): Payer: PPO | Admitting: Internal Medicine

## 2016-11-02 VITALS — BP 118/74 | HR 75 | Temp 97.9°F | Resp 14 | Ht 65.0 in | Wt 111.4 lb

## 2016-11-02 DIAGNOSIS — R1013 Epigastric pain: Secondary | ICD-10-CM | POA: Diagnosis not present

## 2016-11-02 MED ORDER — HYDROCODONE-ACETAMINOPHEN 5-325 MG PO TABS: 1.0000 | ORAL_TABLET | Freq: Two times a day (BID) | ORAL | 0 refills | Status: DC | PRN

## 2016-11-02 NOTE — Progress Notes (Signed)
Pre visit review using our clinic review tool, if applicable. No additional management support is needed unless otherwise documented below in the visit note. 

## 2016-11-02 NOTE — Patient Instructions (Signed)
Will schedule the ultrasound of your abdomen ASAP.  Continue pushing fluids and  small feedings  ER if symptoms severe

## 2016-11-02 NOTE — Progress Notes (Signed)
Subjective:    Patient ID: Mandy Durham, female    DOB: Kalayna 15, 1947, 71 y.o.   MRN: [id number]  DOS:  11/02/2016 Type of visit - description : acute Interval history: Since the last visit, she had extensive workup for epigastric pain. Chart is reviewed and summarized at the end of this note. Unfortunately, she continue with pain. Again the pain is epigastric and mid abdomen, postprandial, may last few hours. Decrease somewhat with Tylenol. Denies dysphagia, odynophagia. Good compliance with PPIs.  Wt Readings from Last 3 Encounters:  11/02/16 111 lb 6 oz (50.5 kg)  10/27/16 111 lb (50.3 kg)  10/27/16 111 lb 3.2 oz (50.4 kg)     Review of Systems No fever chills + Weight loss Bowel movements are twice a week and nonbloody.  Past Medical History:  Diagnosis Date  . Anxiety state, unspecified   . Hyperlipidemia   . Hypertension   . Insomnia   . LBBB (left bundle branch block)    LHC in 2002 showed normal coronaries.   . Osteoarthrosis, unspecified whether generalized or localized, unspecified site   . Overweight(278.02)   . S/P placement of cardiac pacemaker    a. 02/21/16: Medtronic Advisa DR MRI SureScan model [id number] (serial number [id number])   . Serrated adenoma of colon 2007  . Symptomatic menopausal or female climacteric states   . Syncope and collapse    11/12: Holter 12/12 with rare PACS, HR range 64-140, average 82, no significant arrhythmias. Echo (1/13): EF 50-55%, mild LVH, septal-lateral dyssynchrony c/w LBBB. 3-week event monitor (1/13): No significant arrhythmia.     Past Surgical History:  Procedure Laterality Date  . EP IMPLANTABLE DEVICE N/A 02/21/2016   Procedure: Pacemaker Implant;  Surgeon: Will Meredith Leeds, MD;  Location: [facility];  Service: Cardiovascular;  Laterality: N/A;  . TUBAL LIGATION      Social History   Social History  . Marital status: Married    Spouse name: [name]  . Number of children: 2  . Years of education: N/A     Occupational History  . retired Education administrator   Social History Main Topics  . Smoking status: Never Smoker  . Smokeless tobacco: Never Used  . Alcohol use No  . Drug use: No  . Sexual activity: Not on file   Other Topics Concern  . Not on file   Social History Narrative   HSG.  Married '65.  2 daughters, '71, '77; 4 grandchildren               Allergies as of 11/02/2016   No Known Allergies     Medication List       Accurate as of 11/02/16 11:59 PM. Always use your most recent med list.          acetaminophen 500 MG tablet Commonly known as:  TYLENOL Take 500 mg by mouth every 6 (six) hours as needed.   ALPRAZolam 0.25 MG tablet Commonly known as:  XANAX Take 1-2 tablets (0.25-0.5 mg total) by mouth at bedtime as needed for anxiety or sleep.   carbidopa-levodopa 25-100 MG tablet Commonly known as:  SINEMET IR TAKE 1 TABLET BY MOUTH THREE TIMES DAILY   escitalopram 5 MG tablet Commonly known as:  LEXAPRO Take 1 tablet (5 mg total) by mouth daily.   ezetimibe 10 MG tablet Commonly known as:  ZETIA TAKE ONE TABLET BY MOUTH ONCE DAILY   HYDROcodone-acetaminophen 5-325 MG tablet Commonly known as:  NORCO/VICODIN Take 1-2 tablets by mouth 2 (two) times daily as needed.   ipratropium 0.06 % nasal spray Commonly known as:  ATROVENT Place 1-3 sprays into both nostrils 3 (three) times daily.   metoCLOPramide 5 MG tablet Commonly known as:  REGLAN Take 1 tablet (5 mg total) by mouth 4 (four) times daily -  before meals and at bedtime. Take this medication 30 minutes before meals.   metoprolol tartrate 25 MG tablet Commonly known as:  LOPRESSOR TAKE ONE TABLET BY MOUTH TWICE DAILY   midodrine 2.5 MG tablet Commonly known as:  PROAMATINE TAKE 1 TABLET BY MOUTH 3 TIMES DAILY WITH MEALS.   ondansetron 4 MG tablet Commonly known as:  ZOFRAN Take 1 tablet (4 mg total) by mouth every 8 (eight) hours as needed for nausea or  vomiting (take this medication 30 minutes prior to meals).   pantoprazole 40 MG tablet Commonly known as:  PROTONIX Take 1 tablet (40 mg total) by mouth 2 (two) times daily.   pyridostigmine 60 MG tablet Commonly known as:  MESTINON TAKE 1 AND 1/2 TABLETS BYMOUTH THREE TIMES A DAY   sucralfate 1 g tablet Commonly known as:  CARAFATE Take 1 tablet (1 g total) by mouth 4 (four) times daily.          Objective:   Physical Exam BP 118/74 (BP Location: Left Arm, Patient Position: Sitting, Cuff Size: Small)   Pulse 75   Temp 97.9 F (36.6 C) (Oral)   Resp 14   Ht 5\' 5"  (1.651 m)   Wt 111 lb 6 oz (50.5 kg)   SpO2 96%   BMI 18.53 kg/m  General:   Well developed, and overweight appearing . NAD.  HEENT:  Normocephalic . Face symmetric, atraumatic Lungs:  CTA B Normal respiratory effort, no intercostal retractions, no accessory muscle use. Heart: RRR,  no murmur.  Vascular: Good femoral and pedal pulses Abdomen:  Not distended, soft, non-tender, no bruit. No rebound or rigidity.  Skin: Not pale. Not jaundice Neurologic:  alert & oriented X3.  Speech normal, gait appropriate for age and unassisted Psych--  Cognition and judgment appear intact.  Cooperative with normal attention span and concentration.  Behavior appropriate. No anxious or depressed appearing.    Assessment & Plan:   Assessment Prediabetes  HTN Hyperlipidemia Anxiety, insomnia-- xanax, UDS low risk 07-2015 Menopausal; DEXA 2012 -1.5 @ gyn Recurrent Syncope -- extensive cards eval before, DX with autonomic insufficiency ~ 02-2015 --on a  abd binder , pyridostigmine, rx midodrine 06-2015 per cards  --symptomatic bradycardia 2:1-3:1 block, admitted 02-21-2016-- pacemaker placed-- no MRI x 6 weeks  --LBBB Traumatic SAH (after a syncope),TBI--- admitted 01-2015 , d/c to a rehab unit temporarily -- was rx seroquel (aparently for ICU delirium) -- d/c 07-2015 --rx topamax when at rehab for post [facility]  HAs --  had issues w/b/b incontinence, better as of 07-2015 Tremors LUE, onset 9 -2017  PLAN  Workup since the last visit: Abd [place] 09/26/2016: Minimal complex right posterior hepatic cyst GI visit 10/05/2016 , RX  EGD and a CT.  CT suspicious for very mild pancreatitis, blood tests and amylase and lipase negative, resolving pancreatitis?  EGD show erythematous duodenum, gastric biopsies with no malignancy ER visit 10/27/2016 for dehydration, labs were unremarkable, got IV fluids, Epigastric pain: Ongoing, postprandial pain, evidently severe per patient description and with documented weight loss. Physical exam is out of proportion to the complaint. I suspect mesenteric ischemia on clinical grounds although there is no  abdominal bruit, and good peripheral pulses. Will get a ultrasound. Other considerations are duodenitis, mild pancreatitis, IBS (?), dysfunctional GB? Plan: Continue Protonix, check a mesenteric ultrasound, request a stronger pain medication (d/c tramadol, Rx Vicodin). ER if symptoms severe. Likely will need to call GI results.  Time spent more than 35 minutes reviewing the chart and answering day patient's questions.

## 2016-11-03 NOTE — Assessment & Plan Note (Signed)
Workup since the last visit: Abd [place] 09/26/2016: Minimal complex right posterior hepatic cyst GI visit 10/05/2016 , RX  EGD and a CT.  CT suspicious for very mild pancreatitis, blood tests and amylase and lipase negative, resolving pancreatitis?  EGD show erythematous duodenum, gastric biopsies with no malignancy ER visit 10/27/2016 for dehydration, labs were unremarkable, got IV fluids, Epigastric pain: Ongoing, postprandial pain, evidently severe per patient description and with documented weight loss. Physical exam is out of proportion to the complaint. I suspect mesenteric ischemia on clinical grounds although there is no abdominal bruit, and good peripheral pulses. Will get a ultrasound. Other considerations are duodenitis, mild pancreatitis, IBS (?), dysfunctional GB? Plan: Continue Protonix, check a mesenteric ultrasound, request a stronger pain medication (d/c tramadol, Rx Vicodin). ER if symptoms severe. Likely will need to call GI results.

## 2016-11-04 ENCOUNTER — Ambulatory Visit (HOSPITAL_COMMUNITY)
Admission: RE | Admit: 2016-11-04 | Discharge: 2016-11-04 | Disposition: A | Discharge status: Home / Self Care | Payer: PPO | Source: Ambulatory Visit | Attending: Vascular Surgery | Admitting: Vascular Surgery

## 2016-11-04 DIAGNOSIS — R1013 Epigastric pain: Secondary | ICD-10-CM | POA: Diagnosis not present

## 2016-11-06 ENCOUNTER — Telehealth: Payer: Self-pay

## 2016-11-06 DIAGNOSIS — R1013 Epigastric pain: Secondary | ICD-10-CM

## 2016-11-06 MED ORDER — DICYCLOMINE HCL 10 MG PO CAPS: 10.0000 mg | ORAL_CAPSULE | Freq: Three times a day (TID) | ORAL | 0 refills | Status: DC

## 2016-11-06 NOTE — Telephone Encounter (Signed)
Spoke w/ Pt, informed of HIDA scan- she agrees to have imaging completed. Also wanting to know if something stronger can be given for pain. She is currently taking Hydrocodone 5-325mg  2 tabs bid prn which she states doesn't touch the pain (see OV note from 11/02/2016, PCP considered increasing pain meds). Pt requesting something that can be called into [facility] that way she doesn't have to come out to office to pick up. She can be reached at [phone] for questions/concerns.

## 2016-11-06 NOTE — Telephone Encounter (Signed)
Spoke w/ Pt, informed that Dr. Nani Ravens sent Bentyl into [facility]- instructed how to take medication- per Dr. Nani Ravens okay to continue Hydrocodone as well---ED over weekend if not improving.

## 2016-11-06 NOTE — Telephone Encounter (Signed)
Spoke w/ Pt- discussed results of mesenteric ultrasound- no further recommendations were listed by PCP whom is out of office until 11/17/2016. Pt is interested in seeing a "gallbladder specialist", informed that ultrasound of abdomen didn't note abnormalities of gallbladder. Originally recommended to try calling Dr. Lynne Leader office for advice then decided to talk with Dr. Nani Ravens regarding Pt's case. Per Dr. Nani Ravens we could try ordering a HIDA scan. HIDA scan ordered.

## 2016-11-09 ENCOUNTER — Telehealth: Payer: Self-pay | Admitting: Gastroenterology

## 2016-11-09 NOTE — Telephone Encounter (Signed)
The pt saw Mandy Durham on 8/14 and was sent to the ED. She was sent home with possible gastritis.  She is calling today with continued abd pain and was told to follow up with Mandy Durham or Dr Fuller Plan at her last [place].  Follow up scheduled with Mandy Durham for 11/10/16.

## 2016-11-10 ENCOUNTER — Ambulatory Visit: Payer: PPO | Admitting: Internal Medicine

## 2016-11-10 ENCOUNTER — Ambulatory Visit (INDEPENDENT_AMBULATORY_CARE_PROVIDER_SITE_OTHER): Payer: PPO | Admitting: Physician Assistant

## 2016-11-10 ENCOUNTER — Encounter: Payer: Self-pay | Admitting: Physician Assistant

## 2016-11-10 DIAGNOSIS — R110 Nausea: Secondary | ICD-10-CM

## 2016-11-10 DIAGNOSIS — R1013 Epigastric pain: Secondary | ICD-10-CM

## 2016-11-10 MED ORDER — SUCRALFATE 1 G PO TABS: 1.0000 g | ORAL_TABLET | Freq: Three times a day (TID) | ORAL | 1 refills | Status: DC

## 2016-11-10 MED ORDER — ONDANSETRON HCL 4 MG PO TABS: 4.0000 mg | ORAL_TABLET | Freq: Three times a day (TID) | ORAL | 1 refills | Status: DC | PRN

## 2016-11-10 NOTE — Progress Notes (Signed)
Chief Complaint: Abdominal pain and nausea  HPI:  Mandy Durham is a 71 year old Caucasian female with a past medical history listed below who returns to clinic today with continued abdominal pain and nausea.   Please recall patient was initially seen in clinic on 10/05/16 for epigastric pain, weight loss and anorexia. She was scheduled for an EGD and CT of the abdomen and pelvis. CT on 7/96/18 revealed findings suspicious for mild acute pancreatitis involving the pancreatic body and tail. Ultrasound prior to this 09/26/16 showed a gallbladder with no gallstones or wall thickening visualized. Lipase amylase check 10/09/16 were normal. Patient was advised to stay on a clear liquid diet for 2 days and advance to low fat/low residue diet due to possible resolving pancreatitis. She then had EGD 10/06/16 which showed erythematous duodenopathy and a few gastric polyps.     Patient was then seen in clinic 10/27/16 and had remained on a clear liquid diet since her previous visit to our clinic, Tramadol was not helping with her pain and over the past 2 days she was unable to tolerate clear liquids or water. She was dizzy when standing and felt "muscle weakness". She was sent to the ER.      In the ER and 10/27/16 patient had a CMP which showed a chloride low at 98 and ALT low at 13, normal CBC, lipase and urinalysis. She was started on Carafate given a GI cocktail as well as administered a bag of fluids.     Patient then saw her primary care provider 11/02/16 with ongoing, postprandial pain. Mesenteric ischemia was suspected and patient was sent from mesenteric ultrasound as well as given stronger pain medication. She was continued on Protonix. Patient's mesenteric ultrasound was normal. Patient has since been ordered a HIDA scan which she has not completed.   Today, the patient returns to clinic and explains that she continues with abdominal pain, though she appears brighter at time of exam today. She explains that this  pain is a 5-6/10 only if she eats something that isn't bland or a larger meal. She has been sticking to clear liquids and liquid foods in general, but "sometimes I get hungry". Patient tells me the only thing that helps with the pain is 3 Tylenol PM. None of the pain medications have seemed to touch it including Vicodin and Hydrocodone. This pain is experienced about 5-10 minutes after she swallows her food and can last for an hour or so. It is sometimes accompanied by nausea for which she uses Zofran. Patient is currently taking a large amount of stomach-medications including Reglan, dicyclomine and Carafate with meals as well as Protonix 40 mg twice a day and Zofran when necessary.   Patient denies fever, chills, blood in her stool, melena, vomiting, heartburn, reflux or symptoms that awaken her at night.  Past Medical History:  Diagnosis Date  . Anxiety state, unspecified   . Hyperlipidemia   . Hypertension   . Insomnia   . LBBB (left bundle branch block)    LHC in 2002 showed normal coronaries.   . Osteoarthrosis, unspecified whether generalized or localized, unspecified site   . Overweight(278.02)   . S/P placement of cardiac pacemaker    a. 02/21/16: Medtronic Advisa DR MRI SureScan model [id number] (serial number [id number])   . Serrated adenoma of colon 2007  . Symptomatic menopausal or female climacteric states   . Syncope and collapse    11/12: Holter 12/12 with rare PACS, HR range 64-140, average  82, no significant arrhythmias. Echo (1/13): EF 50-55%, mild LVH, septal-lateral dyssynchrony c/w LBBB. 3-week event monitor (1/13): No significant arrhythmia.     Past Surgical History:  Procedure Laterality Date  . EP IMPLANTABLE DEVICE N/A 02/21/2016   Procedure: Pacemaker Implant;  Surgeon: Will Meredith Leeds, MD;  Location: [facility];  Service: Cardiovascular;  Laterality: N/A;  . TUBAL LIGATION      Current Outpatient Prescriptions  Medication Sig Dispense Refill  .  acetaminophen (TYLENOL) 500 MG tablet Take 500 mg by mouth every 6 (six) hours as needed.    . ALPRAZolam (XANAX) 0.25 MG tablet Take 1-2 tablets (0.25-0.5 mg total) by mouth at bedtime as needed for anxiety or sleep. 60 tablet 1  . carbidopa-levodopa (SINEMET IR) 25-100 MG tablet TAKE 1 TABLET BY MOUTH THREE TIMES DAILY 90 tablet 3  . dicyclomine (BENTYL) 10 MG capsule Take 1 capsule (10 mg total) by mouth 4 (four) times daily -  before meals and at bedtime. 60 capsule 0  . escitalopram (LEXAPRO) 5 MG tablet Take 1 tablet (5 mg total) by mouth daily. 30 tablet 3  . ezetimibe (ZETIA) 10 MG tablet TAKE ONE TABLET BY MOUTH ONCE DAILY 30 tablet 5  . HYDROcodone-acetaminophen (NORCO/VICODIN) 5-325 MG tablet Take 1-2 tablets by mouth 2 (two) times daily as needed. 20 tablet 0  . ipratropium (ATROVENT) 0.06 % nasal spray Place 1-3 sprays into both nostrils 3 (three) times daily.     . metoCLOPramide (REGLAN) 5 MG tablet Take 1 tablet (5 mg total) by mouth 4 (four) times daily -  before meals and at bedtime. Take this medication 30 minutes before meals. 120 tablet 0  . metoprolol tartrate (LOPRESSOR) 25 MG tablet TAKE ONE TABLET BY MOUTH TWICE DAILY 180 tablet 2  . midodrine (PROAMATINE) 2.5 MG tablet TAKE 1 TABLET BY MOUTH 3 TIMES DAILY WITH MEALS. 90 tablet 7  . ondansetron (ZOFRAN) 4 MG tablet Take 1 tablet (4 mg total) by mouth every 8 (eight) hours as needed for nausea or vomiting (take this medication 30 minutes prior to meals). 120 tablet 1  . pantoprazole (PROTONIX) 40 MG tablet Take 1 tablet (40 mg total) by mouth 2 (two) times daily. 60 tablet 3  . pyridostigmine (MESTINON) 60 MG tablet TAKE 1 AND 1/2 TABLETS BYMOUTH THREE TIMES A DAY 270 tablet 3  . sucralfate (CARAFATE) 1 g tablet Take 1 tablet (1 g total) by mouth 4 (four) times daily -  with meals and at bedtime. 120 tablet 1   Current Facility-Administered Medications  Medication Dose Route Frequency Provider Last Rate Last Dose  . 0.9 %   sodium chloride infusion  500 mL Intravenous Continuous Ladene Artist, MD        Allergies as of 11/10/2016  . (No Known Allergies)    Family History  Problem Relation Age of Onset  . Heart failure Mother   . Coronary artery disease Mother   . Dementia Mother   . Diabetes Mother   . Colon cancer Neg Hx   . Breast cancer Neg Hx   . Hypertension Neg Hx   . Heart disease Neg Hx   . Heart attack Neg Hx   . Stroke Neg Hx     Social History   Social History  . Marital status: Married    Spouse name: [name]  . Number of children: 2  . Years of education: N/A   Occupational History  . retired Education administrator  Social History Main Topics  . Smoking status: Never Smoker  . Smokeless tobacco: Never Used  . Alcohol use No  . Drug use: No  . Sexual activity: Not on file   Other Topics Concern  . Not on file   Social History Narrative   HSG.  Married '65.  2 daughters, '71, '77; 4 grandchildren             Review of Systems:    Constitutional: No fever or chills Cardiovascular: No chest pain Respiratory: No SOB Gastrointestinal: See HPI and otherwise negative   Physical Exam:  Vital signs: BP 130/70 (BP Location: Left Arm, Patient Position: Sitting, Cuff Size: Normal)   Pulse 68   Ht 5\' 4"  (1.626 m) Comment: height measured without shoes  Wt 110 lb (49.9 kg)   BMI 18.88 kg/m   Constitutional:   Pleasant Elderly Caucasian female appears to be in NAD, Well developed, Well nourished, alert and cooperative Respiratory: Respirations even and unlabored. Lungs clear to auscultation bilaterally.   No wheezes, crackles, or rhonchi.  Cardiovascular: Normal S1, S2. No MRG. Regular rate and rhythm. No peripheral edema, cyanosis or pallor.  Gastrointestinal:  Soft, nondistended, Mild epigastric ttp to light palpation. No rebound or guarding. Normal bowel sounds. No appreciable masses or hepatomegaly. Psychiatric: Demonstrates good judgement  and reason without abnormal affect or behaviors.  MOST RECENT LABS AND IMAGING: CBC    Component Value Date/Time   WBC 6.2 10/27/2016 1753   RBC 5.23 (H) 10/27/2016 1753   HGB 14.5 10/27/2016 1753   HGB 14.2 10/28/2015 1350   HCT 43.1 10/27/2016 1753   HCT 44.1 10/28/2015 1350   PLT 272 10/27/2016 1753   PLT 337 10/28/2015 1350   MCV 82.4 10/27/2016 1753   MCV 86 10/28/2015 1350   MCH 27.7 10/27/2016 1753   MCHC 33.6 10/27/2016 1753   RDW 13.3 10/27/2016 1753   RDW 14.2 10/28/2015 1350   LYMPHSABS 1.6 09/21/2016 1450   MONOABS 0.4 09/21/2016 1450   EOSABS 0.1 09/21/2016 1450   BASOSABS 0.1 09/21/2016 1450    CMP     Component Value Date/Time   NA 139 10/27/2016 1753   NA 142 10/28/2015 1350   K 4.0 10/27/2016 1753   CL 98 (L) 10/27/2016 1753   CO2 28 10/27/2016 1753   GLUCOSE 94 10/27/2016 1753   BUN 11 10/27/2016 1753   BUN 12 10/28/2015 1350   CREATININE 0.70 10/27/2016 1753   CALCIUM 9.5 10/27/2016 1753   PROT 7.2 10/27/2016 1753   PROT 6.7 10/28/2015 1350   ALBUMIN 4.2 10/27/2016 1753   ALBUMIN 3.9 10/28/2015 1350   AST 22 10/27/2016 1753   ALT 13 (L) 10/27/2016 1753   ALKPHOS 59 10/27/2016 1753   BILITOT 1.0 10/27/2016 1753   BILITOT 0.3 10/28/2015 1350   GFRNONAA >60 10/27/2016 1753   GFRAA >60 10/27/2016 1753    Assessment: 1. Epigastric pain: Continues, EGD with mild duodenopathy, CT showing mild pancreatitis though lipase and amylase normal, recent ER workup negative, recent mesenteric ultrasound negative; consider continued mild pancreatitis versus functional dyspepsia versus other 2. Nausea: Likely related to above  Plan: 1. Refilled Zofran 4 mg every 4-every 6 hours #120 with 1 refill 2. Refilled Carafate 1 g 43 times a day 20-30 minutes before meals and at bedtime 3. Patient was previously scheduled for a HIDA scan with CCK per her PCP, will await results of this 4. Will communicate with Dr. Fuller Plan to see if he has any  further recommendations  at this time 5. Patient to return to clinic with next available appointment with Dr. Fuller Plan, will discuss if this can be moved up from October  Ellouise Newer, PA-C [facility] 11/10/2016, 2:45 PM  Cc: Colon Branch, MD

## 2016-11-10 NOTE — Patient Instructions (Addendum)
We have sent the following medications to your pharmacy for you to pick up at your convenience: Carafate 1 gram tablet before meals and at bedtime.  Zofran 4 mg 4-6 hrs as needed for nausea   Please be sure not to take any pain medication 6 hours prior to your imaging.   You have been scheduled for a HIDA scan at [facility] (1st floor) on 11/20/16. Please arrive 15 minutes prior to your scheduled appointment at  7 am. Make certain not to have anything to eat or drink at least 6 hours prior to your test. Should this appointment date or time not work well for you, please call radiology scheduling at [phone].  _____________________________________________________________________ hepatobiliary (HIDA) scan is an imaging procedure used to diagnose problems in the liver, gallbladder and bile ducts. In the HIDA scan, a radioactive chemical or tracer is injected into a vein in your arm. The tracer is handled by the liver like bile. Bile is a fluid produced and excreted by your liver that helps your digestive system break down fats in the foods you eat. Bile is stored in your gallbladder and the gallbladder releases the bile when you eat a meal. A special nuclear medicine scanner (gamma camera) tracks the flow of the tracer from your liver into your gallbladder and small intestine.  During your HIDA scan  You'll be asked to change into a hospital gown before your HIDA scan begins. Your health care team will position you on a table, usually on your back. The radioactive tracer is then injected into a vein in your arm.The tracer travels through your bloodstream to your liver, where it's taken up by the bile-producing cells. The radioactive tracer travels with the bile from your liver into your gallbladder and through your bile ducts to your small intestine.You may feel some pressure while the radioactive tracer is injected into your vein. As you lie on the table, a special gamma camera is positioned  over your abdomen taking pictures of the tracer as it moves through your body. The gamma camera takes pictures continually for about an hour. You'll need to keep still during the HIDA scan. This can become uncomfortable, but you may find that you can lessen the discomfort by taking deep breaths and thinking about other things. Tell your health care team if you're uncomfortable. The radiologist will watch on a computer the progress of the radioactive tracer through your body. The HIDA scan may be stopped when the radioactive tracer is seen in the gallbladder and enters your small intestine. This typically takes about an hour. In some cases extra imaging will be performed if original images aren't satisfactory, if morphine is given to help visualize the gallbladder or if the medication CCK is given to look at the contraction of the gallbladder. This test typically takes 2 hours to complete. ________________________________________________________________________

## 2016-11-11 ENCOUNTER — Other Ambulatory Visit: Payer: Self-pay | Admitting: Cardiology

## 2016-11-11 DIAGNOSIS — R011 Cardiac murmur, unspecified: Secondary | ICD-10-CM

## 2016-11-11 NOTE — Progress Notes (Signed)
Reviewed and agree with initial management plan. If HIDA is negative, would proceed with a repeat abd/pelvic CT.   Pricilla Riffle. Fuller Plan, MD [facility]

## 2016-11-17 ENCOUNTER — Encounter: Payer: PPO | Admitting: *Deleted

## 2016-11-18 ENCOUNTER — Telehealth: Payer: Self-pay | Admitting: Gastroenterology

## 2016-11-18 NOTE — Telephone Encounter (Signed)
Patient wanted to know what kids of soft foods she can have.  She is advised that she could try scrambled eggs, mashed potato, or oatmeal.

## 2016-11-20 ENCOUNTER — Ambulatory Visit (HOSPITAL_COMMUNITY)
Admission: RE | Admit: 2016-11-20 | Discharge: 2016-11-20 | Disposition: A | Discharge status: Home / Self Care | Payer: PPO | Source: Ambulatory Visit | Attending: Family Medicine | Admitting: Family Medicine

## 2016-11-20 ENCOUNTER — Ambulatory Visit: Payer: PPO | Admitting: Internal Medicine

## 2016-11-20 DIAGNOSIS — R109 Unspecified abdominal pain: Secondary | ICD-10-CM | POA: Diagnosis not present

## 2016-11-20 DIAGNOSIS — R1013 Epigastric pain: Secondary | ICD-10-CM | POA: Insufficient documentation

## 2016-11-20 MED ORDER — TECHNETIUM TC 99M MEBROFENIN IV KIT
5.3000 | PACK | Freq: Once | INTRAVENOUS | Status: AC | PRN
  Administered 2016-11-20: 5.3 via INTRAVENOUS

## 2016-11-23 ENCOUNTER — Telehealth: Payer: Self-pay | Admitting: Gastroenterology

## 2016-11-23 ENCOUNTER — Telehealth: Payer: Self-pay | Admitting: Internal Medicine

## 2016-11-23 NOTE — Telephone Encounter (Signed)
Abdominal pain worsening. Please advise. Can you speak w/ Pt?

## 2016-11-23 NOTE — Telephone Encounter (Signed)
Spoke with the patient, aware that a HIDA scan is negative. So far we have no good explanation for her symptoms. The patient reports that symptoms are not any better. Will send a message to GI and see what would be the next step.

## 2016-11-23 NOTE — Telephone Encounter (Signed)
The pt has been advised that the HIDA scan was ordered by Dr Larose Kells,  She was advised that it was negative and she has an appt to follow up with Dr Fuller Plan. She will call Dr Larose Kells and follow up as well

## 2016-11-23 NOTE — Telephone Encounter (Signed)
Pt states her problem is getting worse. Pt req call today if possible even if we do not have results from scan 9/7 at [facility] cone yet.

## 2016-11-24 ENCOUNTER — Encounter: Payer: Self-pay | Admitting: Internal Medicine

## 2016-11-24 ENCOUNTER — Telehealth: Payer: Self-pay

## 2016-11-24 ENCOUNTER — Ambulatory Visit (INDEPENDENT_AMBULATORY_CARE_PROVIDER_SITE_OTHER): Payer: PPO | Admitting: *Deleted

## 2016-11-24 DIAGNOSIS — I459 Conduction disorder, unspecified: Secondary | ICD-10-CM

## 2016-11-24 DIAGNOSIS — Z950 Presence of cardiac pacemaker: Secondary | ICD-10-CM

## 2016-11-24 DIAGNOSIS — R935 Abnormal findings on diagnostic imaging of other abdominal regions, including retroperitoneum: Secondary | ICD-10-CM

## 2016-11-24 NOTE — Telephone Encounter (Signed)
-----   Message from Ladene Artist, MD sent at 11/23/2016  5:59 PM EDT ----- Regarding: RE: further eval ? [name],   We have not found a cause for her pain however given the pancreatic abnormalities on CT the next step is MRI of the pancreas.   [name], please schedule abdominal MRI/MRCP to better evaluate the pancreas and biliary tree. Please obtain CMP, CBC, lipase, amylase.   MS   ----- Message ----- From: Colon Branch, MD Sent: 11/23/2016   4:59 PM To: Ladene Artist, MD, Colon Branch, MD, # Subject: further eval ?                                 Mandy Durham, I spoke with the patient today, she is not any better, workup so far has been negative. Please let me know if you have any suggestions, are you planning to see her again? [facility]

## 2016-11-24 NOTE — Progress Notes (Signed)
Remote pacemaker transmission.   

## 2016-11-24 NOTE — Telephone Encounter (Signed)
duplicate

## 2016-11-24 NOTE — Telephone Encounter (Signed)
Patient has a pacemaker per radiology demographics, order, and pacemaker information to them and they will have MRI tech verify that it is compatible.  Once they have verified compatibility they will contact the patient and schedule the MRI

## 2016-11-24 NOTE — Telephone Encounter (Signed)
I communicated with GI, they are planning to do an abdomen  MRI MRCP abdomen additional labs. Will let the patient know via my chart message

## 2016-11-24 NOTE — Telephone Encounter (Signed)
-----   Message from Ladene Artist, MD sent at 11/23/2016  5:59 PM EDT ----- Regarding: RE: further eval ? [name],   We have not found a cause for her pain however given the pancreatic abnormalities on CT the next step is MRI of the pancreas.   [name], please schedule abdominal MRI/MRCP to better evaluate the pancreas and biliary tree. Please obtain CMP, CBC, lipase, amylase.   MS   ----- Message ----- From: Colon Branch, MD Sent: 11/23/2016   4:59 PM To: Ladene Artist, MD, Colon Branch, MD, # Subject: further eval ?                                 Malcom, I spoke with the patient today, she is not any better, workup so far has been negative. Please let me know if you have any suggestions, are you planning to see her again? [facility]

## 2016-11-25 ENCOUNTER — Encounter: Payer: Self-pay | Admitting: Cardiology

## 2016-11-25 ENCOUNTER — Other Ambulatory Visit (INDEPENDENT_AMBULATORY_CARE_PROVIDER_SITE_OTHER): Payer: PPO

## 2016-11-25 ENCOUNTER — Encounter: Payer: PPO | Admitting: Physical Medicine & Rehabilitation

## 2016-11-25 DIAGNOSIS — R935 Abnormal findings on diagnostic imaging of other abdominal regions, including retroperitoneum: Secondary | ICD-10-CM

## 2016-11-25 LAB — CUP PACEART REMOTE DEVICE CHECK
Battery Voltage: 3.02 V
Brady Statistic AP VP Percent: 38.03 %
Brady Statistic AP VS Percent: 0 %
Brady Statistic AS VS Percent: 0 %
Brady Statistic RV Percent Paced: 99.99 %
Implantable Lead Implant Date: 20171208
Implantable Lead Location: 753859
Implantable Lead Model: 5076
Implantable Pulse Generator Implant Date: 20171208
Lead Channel Impedance Value: 418 Ohm
Lead Channel Impedance Value: 494 Ohm
Lead Channel Impedance Value: 570 Ohm
Lead Channel Pacing Threshold Amplitude: 0.625 V
Lead Channel Sensing Intrinsic Amplitude: 29.625 mV
Lead Channel Setting Pacing Amplitude: 2 V
Lead Channel Setting Pacing Amplitude: 2.5 V
Lead Channel Setting Pacing Pulse Width: 0.4 ms
Lead Channel Setting Sensing Sensitivity: 4 mV
MDC IDC LEAD IMPLANT DT: 20171208
MDC IDC LEAD LOCATION: 753860
MDC IDC MSMT BATTERY REMAINING LONGEVITY: 102 mo
MDC IDC MSMT LEADCHNL RA IMPEDANCE VALUE: 418 Ohm
MDC IDC MSMT LEADCHNL RA PACING THRESHOLD AMPLITUDE: 0.875 V
MDC IDC MSMT LEADCHNL RA PACING THRESHOLD PULSEWIDTH: 0.4 ms
MDC IDC MSMT LEADCHNL RA SENSING INTR AMPL: 1 mV
MDC IDC MSMT LEADCHNL RA SENSING INTR AMPL: 1 mV
MDC IDC MSMT LEADCHNL RV PACING THRESHOLD PULSEWIDTH: 0.4 ms
MDC IDC MSMT LEADCHNL RV SENSING INTR AMPL: 29.625 mV
MDC IDC SESS DTM: 20180911145104
MDC IDC STAT BRADY AS VP PERCENT: 61.97 %
MDC IDC STAT BRADY RA PERCENT PACED: 38.02 %

## 2016-11-25 LAB — CBC WITH DIFFERENTIAL/PLATELET
BASOS PCT: 0.7 % (ref 0.0–3.0)
Basophils Absolute: 0 10*3/uL (ref 0.0–0.1)
Eosinophils Absolute: 0.1 10*3/uL (ref 0.0–0.7)
Eosinophils Relative: 0.8 % (ref 0.0–5.0)
HCT: 44.7 % (ref 36.0–46.0)
HEMOGLOBIN: 14.6 g/dL (ref 12.0–15.0)
LYMPHS ABS: 1.5 10*3/uL (ref 0.7–4.0)
Lymphocytes Relative: 21.9 % (ref 12.0–46.0)
MCHC: 32.7 g/dL (ref 30.0–36.0)
MCV: 87.1 fl (ref 78.0–100.0)
Monocytes Absolute: 0.5 10*3/uL (ref 0.1–1.0)
Monocytes Relative: 7.5 % (ref 3.0–12.0)
NEUTROS ABS: 4.7 10*3/uL (ref 1.4–7.7)
Neutrophils Relative %: 69.1 % (ref 43.0–77.0)
PLATELETS: 306 10*3/uL (ref 150.0–400.0)
RBC: 5.14 Mil/uL — ABNORMAL HIGH (ref 3.87–5.11)
RDW: 15.4 % (ref 11.5–15.5)
WBC: 6.8 10*3/uL (ref 4.0–10.5)

## 2016-11-25 LAB — COMPREHENSIVE METABOLIC PANEL
ALBUMIN: 4.1 g/dL (ref 3.5–5.2)
ALT: 14 U/L (ref 0–35)
AST: 18 U/L (ref 0–37)
Alkaline Phosphatase: 64 U/L (ref 39–117)
BILIRUBIN TOTAL: 0.7 mg/dL (ref 0.2–1.2)
BUN: 13 mg/dL (ref 6–23)
CALCIUM: 9.8 mg/dL (ref 8.4–10.5)
CO2: 28 meq/L (ref 19–32)
CREATININE: 0.93 mg/dL (ref 0.40–1.20)
Chloride: 101 mEq/L (ref 96–112)
GFR: 62.96 mL/min (ref >60.00)
Glucose, Bld: 103 mg/dL — ABNORMAL HIGH (ref 70–99)
Potassium: 4 mEq/L (ref 3.5–5.1)
Sodium: 139 mEq/L (ref 135–145)
Total Protein: 6.9 g/dL (ref 6.0–8.3)

## 2016-11-25 LAB — LIPASE: LIPASE: 25 U/L (ref 11.0–59.0)

## 2016-11-25 LAB — AMYLASE: AMYLASE: 31 U/L (ref 27–131)

## 2016-11-26 ENCOUNTER — Encounter: Payer: PPO | Admitting: Gastroenterology

## 2016-11-26 NOTE — Telephone Encounter (Signed)
MRI has been arranged with the patient for 12/08/16

## 2016-11-27 ENCOUNTER — Telehealth: Payer: Self-pay | Admitting: Internal Medicine

## 2016-11-27 MED ORDER — HYDROCODONE-ACETAMINOPHEN 5-325 MG PO TABS: 1.0000 | ORAL_TABLET | Freq: Two times a day (BID) | ORAL | 0 refills | Status: DC | PRN

## 2016-11-27 NOTE — Telephone Encounter (Signed)
Relation to GM:WNUU Call back number:[phone]  Reason for call:  Patient was adamant about coming to the office to pick up Rx, informed patient of office policy regarding requesting 48 hour in advance all medication refill, patient states she would like to speak with [facility], please advise

## 2016-11-27 NOTE — Telephone Encounter (Signed)
Spoke w/ Pt- informed Rx placed at front desk for pick up at her convenience.

## 2016-11-27 NOTE — Telephone Encounter (Signed)
If she is able to pick up a prescription, okay hydrocodone No. 20. If not, fax ultram  #20.

## 2016-11-27 NOTE — Addendum Note (Signed)
Addended by: Marlon Pel on: 11/27/2016 11:14 AM   Modules accepted: Orders

## 2016-11-27 NOTE — Telephone Encounter (Signed)
Pt said that if she's not able to make it to the office for pick up before close. She would like to know  If provider could send in something else to pharmacy for pain?   Please assist and advise.

## 2016-11-27 NOTE — Telephone Encounter (Signed)
Self   Refill for HYDROcodone   CB: [phone]

## 2016-11-30 ENCOUNTER — Telehealth: Payer: Self-pay | Admitting: Gastroenterology

## 2016-11-30 MED ORDER — DIAZEPAM 5 MG PO TABS: 5.0000 mg | ORAL_TABLET | Freq: Four times a day (QID) | ORAL | 0 refills | Status: DC | PRN

## 2016-11-30 MED ORDER — DIAZEPAM 5 MG PO TABS: 5.0000 mg | ORAL_TABLET | Freq: Once | ORAL | 0 refills | Status: AC

## 2016-11-30 NOTE — Telephone Encounter (Signed)
Patient notified.  She will come pick up rx

## 2016-11-30 NOTE — Telephone Encounter (Signed)
Please advise on question from the patient

## 2016-11-30 NOTE — Telephone Encounter (Signed)
Valium 5 mg po 1 hour prior to MRI, only if she does not drive for the rest of the day after taking.

## 2016-12-08 ENCOUNTER — Ambulatory Visit (HOSPITAL_COMMUNITY)
Admission: RE | Admit: 2016-12-08 | Discharge: 2016-12-08 | Disposition: A | Discharge status: Home / Self Care | Payer: PPO | Source: Ambulatory Visit | Attending: Gastroenterology | Admitting: Gastroenterology

## 2016-12-08 ENCOUNTER — Other Ambulatory Visit: Payer: Self-pay | Admitting: Gastroenterology

## 2016-12-08 DIAGNOSIS — K7689 Other specified diseases of liver: Secondary | ICD-10-CM | POA: Diagnosis not present

## 2016-12-08 DIAGNOSIS — R935 Abnormal findings on diagnostic imaging of other abdominal regions, including retroperitoneum: Secondary | ICD-10-CM

## 2016-12-08 DIAGNOSIS — K769 Liver disease, unspecified: Secondary | ICD-10-CM | POA: Diagnosis not present

## 2016-12-08 LAB — CREATININE, SERUM
Creatinine, Ser: 0.79 mg/dL (ref 0.44–1.00)
GFR calc Af Amer: >60 mL/min (ref >60)
GFR calc non Af Amer: >60 mL/min (ref >60)

## 2016-12-08 MED ORDER — GADOBENATE DIMEGLUMINE 529 MG/ML IV SOLN
11.0000 mL | Freq: Once | INTRAVENOUS | Status: AC | PRN
  Administered 2016-12-08: 11 mL via INTRAVENOUS

## 2016-12-08 NOTE — Progress Notes (Signed)
Pt monitored through out MRI due to pacemaker.  [employer] rep here.  Pt tolerated well.

## 2016-12-09 ENCOUNTER — Telehealth: Payer: Self-pay

## 2016-12-09 ENCOUNTER — Encounter: Payer: Self-pay | Admitting: Cardiology

## 2016-12-09 MED ORDER — TRAMADOL HCL 50 MG PO TABS: 50.0000 mg | ORAL_TABLET | Freq: Three times a day (TID) | ORAL | 0 refills | Status: DC | PRN

## 2016-12-09 NOTE — Telephone Encounter (Signed)
Informed patient that we will fax the prescription to her pharmacy. Patient verbalized understanding.

## 2016-12-09 NOTE — Telephone Encounter (Signed)
Yes, it is perfectly ok to refill her Tramadol. Thanks-JLL

## 2016-12-09 NOTE — Telephone Encounter (Signed)
Patient would like a refill of her Tramadol sent to the pharmacy until her appt in Oct with Dr. Fuller Plan. Informed patient Dr. Fuller Plan is out of town but I will ask Ellouise Newer, PA to refill since she has seen patient recently.

## 2016-12-11 ENCOUNTER — Telehealth: Payer: Self-pay | Admitting: Internal Medicine

## 2016-12-11 MED ORDER — PROMETHAZINE HCL 12.5 MG PO TABS: 12.5000 mg | ORAL_TABLET | Freq: Three times a day (TID) | ORAL | 0 refills | Status: DC | PRN

## 2016-12-11 NOTE — Telephone Encounter (Signed)
Pt believes if she had a stronger medicine it would help her nausea. Please send to [facility] on [address]. Call pt to confirm.

## 2016-12-11 NOTE — Telephone Encounter (Signed)
Please advise 

## 2016-12-11 NOTE — Telephone Encounter (Signed)
Patient feeling nausea she states ondansetron (ZOFRAN) 4 MG tablet is not working

## 2016-12-11 NOTE — Telephone Encounter (Signed)
Hold Zofran We can try Phenergan one or 2 tablets every 8 hours as needed. Watch for somnolence. If not better needs to be seen

## 2016-12-14 ENCOUNTER — Telehealth: Payer: Self-pay

## 2016-12-14 NOTE — Telephone Encounter (Signed)
PA initiated via Covermymeds; KEY: [id number]. Awaiting determination.

## 2016-12-15 NOTE — Telephone Encounter (Signed)
PA approved 12/14/2016 through 03/15/2017.

## 2016-12-21 ENCOUNTER — Encounter: Payer: Self-pay | Admitting: Gastroenterology

## 2016-12-21 ENCOUNTER — Ambulatory Visit (INDEPENDENT_AMBULATORY_CARE_PROVIDER_SITE_OTHER): Payer: PPO | Admitting: Gastroenterology

## 2016-12-21 VITALS — BP 100/60 | HR 62 | Ht 64.0 in | Wt 103.0 lb

## 2016-12-21 DIAGNOSIS — R1013 Epigastric pain: Secondary | ICD-10-CM | POA: Diagnosis not present

## 2016-12-21 NOTE — Progress Notes (Signed)
    History of Present Illness: This is a 71 year old female with persistent epigastric pain. She is accompanied by her husband. Her appetite and nausea have improved. Pt reports Tylenol PM is effective to relieve her pain. Possible mild acute pancreatitis has resolved per most recent imaging below.   MRI/MRCP 11/2016 IMPRESSION: 1. No acute findings identified. 2. The pancreatic duct is upper limits of normal in caliber measuring 3 mm. No pancreatic inflammation or mass identified. 3. The gallbladder appears normal.  No gallstones. 4. Liver cyst  Current Medications, Allergies, Past Medical History, Past Surgical History, Family History and Social History were reviewed in [employer] record.  Physical Exam: General: Well developed, well nourished, no acute distress Head: Normocephalic and atraumatic Eyes:  sclerae anicteric, EOMI Ears: Normal auditory acuity Mouth: No deformity or lesions Lungs: Clear throughout to auscultation Heart: Regular rate and rhythm; no murmurs, rubs or bruits Abdomen: Soft, focal epigastric tenderness to light palpation and non distended. No masses, hepatosplenomegaly or hernias noted. Normal Bowel sounds Musculoskeletal: Symmetrical with no gross deformities  Pulses:  Normal pulses noted Extremities: No clubbing, cyanosis, edema or deformities noted Neurological: Alert oriented x 4, slow motor activity, tremor, quiet voice Psychological:  Alert and cooperative. Anxious.  Assessment and Recommendations:  1. Abdominal wall pain. Tylenol 1-2 q6h prn pain and/or ibuprofen 1-2 q6h prn pain. Continue Phenergan 12.5 mg 3 times a day when necessary nausea, vomting. She had been previously advised to discontinue Reglan, dicyclomine and Zofran. Considered low dose amitripytiline however this might negatively interact with other diseases/medications. Reviewed entire GI evaluation to date with pt and her husband. They were reassured that an  extensive GI evaluation was unremarkable except for possible mild pancreatitis that, if it was present in July, has completely resolved.   2.  Possible mild pancreatitis, resolved.   3. TBI, anxiety and depression could be contributing to her GI/abdominal symptoms. Follow up with PCP and neurology.

## 2016-12-21 NOTE — Patient Instructions (Addendum)
Use Advil or tylenol 1-2 tablets every six hours as needed for abdominal wall pain.   Thank you for choosing me and [facility].  Pricilla Riffle. Dagoberto Ligas., MD., Marval Regal

## 2016-12-23 ENCOUNTER — Other Ambulatory Visit: Payer: Self-pay | Admitting: Internal Medicine

## 2016-12-23 ENCOUNTER — Telehealth: Payer: Self-pay | Admitting: Internal Medicine

## 2016-12-23 NOTE — Telephone Encounter (Signed)
Pt is requesting refill on alprazolam 0.25mg .  Last OV: 11/02/2016 Last Fill: 10/20/2016 #60 and 1RF UDS: 07/22/2015 Low risk  NCCR printed; will discuss w/ PCP in person, several issues noted (multiple pain medications from multiple providers in 30 day period w/o notification to this office).

## 2016-12-23 NOTE — Telephone Encounter (Signed)
°  Relation to LK:GMWN Call back number:[phone] Pharmacy: [facility] ([address]), [place] [phone] (Phone) [phone] (Fax)     Reason for call:  Patient requesting a refill ALPRAZolam (XANAX) 0.25 MG tablet

## 2016-12-23 NOTE — Telephone Encounter (Signed)
Received request from pharmacy. Awaiting response from PCP.

## 2016-12-24 NOTE — Telephone Encounter (Signed)
Xanax called into [facility] prescriber line. Informed Pt that Rx has been sent. Also, informed Pt to make sure she is only receiving controlled substances from 1 office. Pt verbalized understanding.

## 2016-12-24 NOTE — Telephone Encounter (Signed)
I refill Xanax regularly. She got diazepam 1 for a procedure which is okay. I prescribed hydrocodone a couple of times for pain however GI prescribed tramadol for the same reason. 1. Please educated patient. Can not get controlled substance excepts from this office per our contract. 2. Okay to refill Xanax #60 one refill. 3. Will review NCCR w/ every  refill.

## 2016-12-24 NOTE — Telephone Encounter (Signed)
[name] [phone]  [facility] -- [facility] [place]  ALPRAZolam Duanne Moron) 0.25 MG table   Dennise called to check on the status of this refill. Please call and advise patient.

## 2016-12-28 ENCOUNTER — Telehealth: Payer: Self-pay | Admitting: Internal Medicine

## 2016-12-28 MED ORDER — TRAMADOL HCL 50 MG PO TABS: 50.0000 mg | ORAL_TABLET | Freq: Three times a day (TID) | ORAL | 0 refills | Status: DC | PRN

## 2016-12-28 MED ORDER — PROMETHAZINE HCL 12.5 MG PO TABS: 12.5000 mg | ORAL_TABLET | Freq: Three times a day (TID) | ORAL | 0 refills | Status: DC | PRN

## 2016-12-28 NOTE — Telephone Encounter (Signed)
Patient to let us know which one works better for her. Ok  #20 no refill of the one she elects. Advise to watch for excessive sedation and recommend not to mix them.

## 2016-12-28 NOTE — Telephone Encounter (Signed)
Spoke w/ [name], Pt's husband, he informed that Pt is still not doing well, having to lay down w/ heating pad on her abdomen, he informed that when they saw Dr. Fuller Plan last he mentioned Pt see a "Financial trader", they are interested in seeing someone. Recommended they send MyChart message to Dr. Fuller Plan or call his office to ask for who he recommends (nothing note on 12/21/2016) or for referral. Eddie verbalized understanding. Spoke w/ [place], she informed me that Tramadol helps the most w/ pain, rx refilled and faxed to [facility]. She also asked that Phenergan be refilled as well she has 6 tablets left ----> will do.

## 2016-12-28 NOTE — Telephone Encounter (Signed)
Please advise, Pt has tramadol 50mg  and Norco 5-325mg  on med list.

## 2016-12-28 NOTE — Telephone Encounter (Signed)
Pt request refill pain medication. Pt also wants to speak to [facility] about her stomach pain before making an appt. Please call [phone].

## 2016-12-29 ENCOUNTER — Telehealth: Payer: Self-pay | Admitting: Gastroenterology

## 2016-12-29 NOTE — Telephone Encounter (Signed)
Patient notified of the appt details for [facility] with Dr. Newman Pies for 04/08/17 10:00

## 2016-12-29 NOTE — Telephone Encounter (Signed)
Workup has been so far negative, will treat symptomatically with Ultram.

## 2016-12-29 NOTE — Telephone Encounter (Signed)
Patient would like to be referred for her continued abdominal pain.  Please advise

## 2016-12-29 NOTE — Telephone Encounter (Signed)
Refer to [facility]

## 2017-01-01 ENCOUNTER — Telehealth: Payer: Self-pay | Admitting: Internal Medicine

## 2017-01-01 ENCOUNTER — Telehealth: Payer: Self-pay | Admitting: Gastroenterology

## 2017-01-01 NOTE — Telephone Encounter (Signed)
Please advise 

## 2017-01-01 NOTE — Telephone Encounter (Signed)
Pt called in to request a referral she said that  Dr. Fuller Plan has giving her an apt on 04/03/17 to see a stomach Dr at [facility], pt feels that that is to far out. Pt would like to know if Dr. Larose Kells could refer her to Dr Donne Hazel to remove gallbladder.    CB: [phone]- pt would like to be advised and assist further.

## 2017-01-01 NOTE — Telephone Encounter (Signed)
Discussed with Dr. Fuller Plan.  No indication for gallbladder dysfunction and he does not feel referral is indicated. Patient advised that she can discuss with her primary care if she feels strongly, but we will not be able to refer to [facility].  She is advised to keep appt with [facility] for January

## 2017-01-01 NOTE — Telephone Encounter (Signed)
Recommend to call [facility] and see if she can be seen sooner. She does not have a gallbladder problem that I know. Unable to refer her to surgery for that purpose.

## 2017-01-04 NOTE — Telephone Encounter (Signed)
Spoke w/ Pt, informed of recommendations. Pt verbalized understanding. She informed that [facility] called Friday and had a cancellation, was able to move her appt to January 14, 2017.

## 2017-01-14 ENCOUNTER — Encounter: Payer: PPO | Admitting: Cardiology

## 2017-01-14 DIAGNOSIS — R110 Nausea: Secondary | ICD-10-CM | POA: Diagnosis not present

## 2017-01-14 DIAGNOSIS — K859 Acute pancreatitis without necrosis or infection, unspecified: Secondary | ICD-10-CM | POA: Diagnosis not present

## 2017-01-14 DIAGNOSIS — R634 Abnormal weight loss: Secondary | ICD-10-CM | POA: Diagnosis not present

## 2017-01-14 DIAGNOSIS — R109 Unspecified abdominal pain: Secondary | ICD-10-CM | POA: Diagnosis not present

## 2017-01-18 ENCOUNTER — Telehealth: Payer: Self-pay | Admitting: Internal Medicine

## 2017-01-18 NOTE — Telephone Encounter (Signed)
Relation to TW:KMQK Call back [phone]   Reason for call:  Patient requesting traMADol (ULTRAM) 50 MG tablet refill, please advise

## 2017-01-18 NOTE — Telephone Encounter (Signed)
Pt is requesting refill on tramadol 50mg .  Last OV: 11/02/2016  Last Fill: 12/28/2016 #20 and 0RF UDS: 07/22/2015 Low risk  Please adivse.

## 2017-01-19 MED ORDER — TRAMADOL HCL 50 MG PO TABS: 50.0000 mg | ORAL_TABLET | Freq: Three times a day (TID) | ORAL | 0 refills | Status: DC | PRN

## 2017-01-19 NOTE — Telephone Encounter (Signed)
Rx faxed to [facility]  

## 2017-01-19 NOTE — Telephone Encounter (Signed)
Rx printed, awaiting MD signature.  

## 2017-01-19 NOTE — Telephone Encounter (Signed)
Ok 20

## 2017-01-25 ENCOUNTER — Other Ambulatory Visit: Payer: Self-pay | Admitting: Physical Medicine & Rehabilitation

## 2017-01-25 DIAGNOSIS — F419 Anxiety disorder, unspecified: Secondary | ICD-10-CM

## 2017-01-25 DIAGNOSIS — F32A Depression, unspecified: Secondary | ICD-10-CM

## 2017-01-25 DIAGNOSIS — F329 Major depressive disorder, single episode, unspecified: Secondary | ICD-10-CM

## 2017-02-01 DIAGNOSIS — R634 Abnormal weight loss: Secondary | ICD-10-CM | POA: Diagnosis not present

## 2017-02-01 DIAGNOSIS — R1084 Generalized abdominal pain: Secondary | ICD-10-CM | POA: Diagnosis not present

## 2017-02-01 DIAGNOSIS — R109 Unspecified abdominal pain: Secondary | ICD-10-CM | POA: Diagnosis not present

## 2017-02-01 DIAGNOSIS — R110 Nausea: Secondary | ICD-10-CM | POA: Diagnosis not present

## 2017-02-01 DIAGNOSIS — I10 Essential (primary) hypertension: Secondary | ICD-10-CM | POA: Diagnosis not present

## 2017-02-02 ENCOUNTER — Other Ambulatory Visit: Payer: Self-pay | Admitting: Internal Medicine

## 2017-02-02 NOTE — Telephone Encounter (Signed)
Rx printed, awaiting MD signature.  

## 2017-02-02 NOTE — Telephone Encounter (Signed)
Ok #20.  No refills

## 2017-02-02 NOTE — Telephone Encounter (Signed)
Pt is requesting refill on tramadol 50mg .  Last OV: 11/02/2016 Last Fill: 01/19/2017 #20 and 0RF Pt sig: 1 tab tid prn UDS: 07/22/2015 Low risk  NCCR printed; no issues noted  Please advise.

## 2017-02-02 NOTE — Telephone Encounter (Signed)
Rx faxed to [facility]  

## 2017-02-12 ENCOUNTER — Telehealth: Payer: Self-pay | Admitting: Internal Medicine

## 2017-02-12 NOTE — Telephone Encounter (Signed)
Please call patient, still taking tramadol for abdominal pain?

## 2017-02-12 NOTE — Telephone Encounter (Signed)
Copied from [place] [id number]. Topic: Quick Communication - See Telephone Encounter >> Feb 12, 2017 10:31 AM Corie Chiquito, NT wrote: CRM for notification. See Telephone encounter for: Patient needs a refill on her Tramadol. If someone could please give her a call back.  02/12/17.

## 2017-02-12 NOTE — Telephone Encounter (Signed)
Tried calling Pt, busy signal.

## 2017-02-12 NOTE — Telephone Encounter (Signed)
Medication Refill  °Tramadol  °

## 2017-02-12 NOTE — Telephone Encounter (Signed)
Pt is requesting refill on Tramadol 50mg .   Last OV: 11/02/2016 Last Fill: 02/02/2017 #20 and 0RF (Pt sig: 1 tab tid prn) UDS: 07/22/2015 Low risk  NCCR printed 02/02/2017- in media   Please advise.

## 2017-02-15 ENCOUNTER — Ambulatory Visit: Payer: PPO | Admitting: Gastroenterology

## 2017-02-16 ENCOUNTER — Other Ambulatory Visit: Payer: Self-pay | Admitting: Internal Medicine

## 2017-02-16 MED ORDER — PROMETHAZINE HCL 12.5 MG PO TABS: 12.5000 mg | ORAL_TABLET | Freq: Three times a day (TID) | ORAL | 0 refills | Status: DC | PRN

## 2017-02-16 MED ORDER — TRAMADOL HCL 50 MG PO TABS: 50.0000 mg | ORAL_TABLET | Freq: Two times a day (BID) | ORAL | 0 refills | Status: DC | PRN

## 2017-02-16 NOTE — Telephone Encounter (Signed)
Returning call, does take tramadol for abdominal pain, also needs refill on promethazine (PHENERGAN) 12.5 MG tablet

## 2017-02-16 NOTE — Telephone Encounter (Signed)
Sent  tramadol #20 and Phenergan #30. I do not recommend tramadol for long-term management of abdominal pain, will have to address this issue in the near future.

## 2017-02-23 ENCOUNTER — Encounter: Payer: PPO | Admitting: *Deleted

## 2017-02-26 ENCOUNTER — Encounter: Payer: Self-pay | Admitting: Cardiology

## 2017-03-02 ENCOUNTER — Other Ambulatory Visit: Payer: Self-pay | Admitting: Physical Medicine & Rehabilitation

## 2017-03-02 ENCOUNTER — Other Ambulatory Visit: Payer: Self-pay | Admitting: Internal Medicine

## 2017-03-02 DIAGNOSIS — F419 Anxiety disorder, unspecified: Secondary | ICD-10-CM

## 2017-03-02 DIAGNOSIS — F329 Major depressive disorder, single episode, unspecified: Secondary | ICD-10-CM

## 2017-03-02 NOTE — Telephone Encounter (Signed)
Pt is requesting refill on alprazolam 0.25mg   Last OV: 11/02/2016 Last Fill: 12/24/2016 #60 and 1RF UDS: 07/22/2015 Low risk  Unable to access NCCR at this time  Please advise.

## 2017-03-02 NOTE — Telephone Encounter (Signed)
Thank you :)

## 2017-03-02 NOTE — Telephone Encounter (Signed)
done

## 2017-03-11 ENCOUNTER — Ambulatory Visit (INDEPENDENT_AMBULATORY_CARE_PROVIDER_SITE_OTHER): Payer: PPO | Admitting: *Deleted

## 2017-03-11 DIAGNOSIS — I459 Conduction disorder, unspecified: Secondary | ICD-10-CM | POA: Diagnosis not present

## 2017-03-12 ENCOUNTER — Encounter: Payer: Self-pay | Admitting: Cardiology

## 2017-03-12 NOTE — Progress Notes (Signed)
Remote pacemaker transmission.   

## 2017-03-26 LAB — CUP PACEART REMOTE DEVICE CHECK
Battery Voltage: 3.01 V
Brady Statistic AP VP Percent: 6.79 %
Brady Statistic AS VP Percent: 93.2 %
Brady Statistic RA Percent Paced: 6.79 %
Date Time Interrogation Session: 20181227214505
Implantable Lead Implant Date: 20171208
Implantable Lead Location: 753860
Implantable Lead Model: 5076
Implantable Pulse Generator Implant Date: 20171208
Lead Channel Impedance Value: 399 Ohm
Lead Channel Impedance Value: 399 Ohm
Lead Channel Pacing Threshold Amplitude: 0.75 V
Lead Channel Pacing Threshold Pulse Width: 0.4 ms
Lead Channel Pacing Threshold Pulse Width: 0.4 ms
Lead Channel Sensing Intrinsic Amplitude: 24.75 mV
Lead Channel Setting Pacing Amplitude: 2 V
Lead Channel Setting Pacing Pulse Width: 0.4 ms
MDC IDC LEAD IMPLANT DT: 20171208
MDC IDC LEAD LOCATION: 753859
MDC IDC MSMT BATTERY REMAINING LONGEVITY: 94 mo
MDC IDC MSMT LEADCHNL RA IMPEDANCE VALUE: 551 Ohm
MDC IDC MSMT LEADCHNL RA SENSING INTR AMPL: 2.25 mV
MDC IDC MSMT LEADCHNL RA SENSING INTR AMPL: 2.25 mV
MDC IDC MSMT LEADCHNL RV IMPEDANCE VALUE: 456 Ohm
MDC IDC MSMT LEADCHNL RV PACING THRESHOLD AMPLITUDE: 0.5 V
MDC IDC MSMT LEADCHNL RV SENSING INTR AMPL: 24.75 mV
MDC IDC SET LEADCHNL RV PACING AMPLITUDE: 2.5 V
MDC IDC SET LEADCHNL RV SENSING SENSITIVITY: 4 mV
MDC IDC STAT BRADY AP VS PERCENT: 0 %
MDC IDC STAT BRADY AS VS PERCENT: 0.01 %
MDC IDC STAT BRADY RV PERCENT PACED: 99.99 %

## 2017-04-04 ENCOUNTER — Other Ambulatory Visit: Payer: Self-pay | Admitting: Internal Medicine

## 2017-04-04 ENCOUNTER — Other Ambulatory Visit: Payer: Self-pay | Admitting: Family Medicine

## 2017-04-16 ENCOUNTER — Encounter: Payer: Self-pay | Admitting: Internal Medicine

## 2017-04-16 ENCOUNTER — Ambulatory Visit (INDEPENDENT_AMBULATORY_CARE_PROVIDER_SITE_OTHER): Payer: PPO | Admitting: Internal Medicine

## 2017-04-16 VITALS — BP 124/68 | HR 61 | Temp 97.7°F | Resp 14 | Ht 64.0 in | Wt 117.4 lb

## 2017-04-16 DIAGNOSIS — F329 Major depressive disorder, single episode, unspecified: Secondary | ICD-10-CM | POA: Diagnosis not present

## 2017-04-16 DIAGNOSIS — F419 Anxiety disorder, unspecified: Secondary | ICD-10-CM

## 2017-04-16 DIAGNOSIS — F32A Depression, unspecified: Secondary | ICD-10-CM

## 2017-04-16 DIAGNOSIS — J069 Acute upper respiratory infection, unspecified: Secondary | ICD-10-CM | POA: Diagnosis not present

## 2017-04-16 DIAGNOSIS — Z79899 Other long term (current) drug therapy: Secondary | ICD-10-CM | POA: Diagnosis not present

## 2017-04-16 MED ORDER — AZELASTINE HCL 0.1 % NA SOLN: 2.0000 | Freq: Every evening | NASAL | 3 refills | Status: DC | PRN

## 2017-04-16 NOTE — Progress Notes (Signed)
Subjective:    Patient ID: Mandy Durham, female    DOB: [birth year], 72 y.o.   MRN: [id number]  DOS:  04/16/2017 Type of visit - description : acute Interval history: Symptoms started 4 days ago: Subjective fever, sore throat, malaise, yellow nasal discharge.  Has not taking any OTCs for her symptoms. Epigastric pain: Ongoing, chart reviewed Anxiety: On Xanax, needs a UDS and contract.  Wt Readings from Last 3 Encounters:  04/16/17 117 lb 6 oz (53.2 kg)  12/21/16 103 lb (46.7 kg)  11/10/16 110 lb (49.9 kg)     Review of Systems  Denies sinus pain. No actual cough No diarrhea, no headaches, no rash. Past Medical History:  Diagnosis Date  . Anxiety state, unspecified   . Hyperlipidemia   . Hypertension   . Insomnia   . LBBB (left bundle branch block)    LHC in 2002 showed normal coronaries.   . Osteoarthrosis, unspecified whether generalized or localized, unspecified site   . Overweight(278.02)   . S/P placement of cardiac pacemaker    a. 02/21/16: Medtronic Advisa DR MRI SureScan model [id number] (serial number [id number])   . Serrated adenoma of colon 2007  . Symptomatic menopausal or female climacteric states   . Syncope and collapse    11/12: Holter 12/12 with rare PACS, HR range 64-140, average 82, no significant arrhythmias. Echo (1/13): EF 50-55%, mild LVH, septal-lateral dyssynchrony c/w LBBB. 3-week event monitor (1/13): No significant arrhythmia.     Past Surgical History:  Procedure Laterality Date  . EP IMPLANTABLE DEVICE N/A 02/21/2016   Procedure: Pacemaker Implant;  Surgeon: Will Meredith Leeds, MD;  Location: [facility];  Service: Cardiovascular;  Laterality: N/A;  . TUBAL LIGATION      Social History   Socioeconomic History  . Marital status: Married    Spouse name: [name]  . Number of children: 2  . Years of education: Not on file  . Highest education level: Not on file  Social Needs  . Financial resource strain: Not on file  . Food  insecurity - worry: Not on file  . Food insecurity - inability: Not on file  . Transportation needs - medical: Not on file  . Transportation needs - non-medical: Not on file  Occupational History  . Occupation: retired Environmental consultant: [employer]  Tobacco Use  . Smoking status: Never Smoker  . Smokeless tobacco: Never Used  Substance and Sexual Activity  . Alcohol use: No  . Drug use: No  . Sexual activity: Not on file  Other Topics Concern  . Not on file  Social History Narrative   HSG.  Married '65.  2 daughters, '71, '77; 4 grandchildren            Allergies as of 04/16/2017   No Known Allergies     Medication List        Accurate as of 04/16/17  1:47 PM. Always use your most recent med list.          acetaminophen 500 MG tablet Commonly known as:  TYLENOL Take 500 mg by mouth every 6 (six) hours as needed.   ALPRAZolam 0.25 MG tablet Commonly known as:  XANAX TAKE 1 TO 2 TABLETS BY MOUTH AT BEDTIME AS NEEDED   carbidopa-levodopa 25-100 MG tablet Commonly known as:  SINEMET IR TAKE 1 TABLET BY MOUTH THREE TIMES DAILY   escitalopram 5 MG tablet Commonly known as:  LEXAPRO TAKE 1  TABLET BY MOUTH ONCE DAILY   ezetimibe 10 MG tablet Commonly known as:  ZETIA TAKE 1 TABLET BY MOUTH ONCE DAILY   HYDROcodone-acetaminophen 5-325 MG tablet Commonly known as:  NORCO/VICODIN Take 1-2 tablets by mouth 2 (two) times daily as needed.   ipratropium 0.06 % nasal spray Commonly known as:  ATROVENT Place 1-3 sprays into both nostrils 3 (three) times daily.   metoprolol tartrate 25 MG tablet Commonly known as:  LOPRESSOR TAKE ONE TABLET BY MOUTH TWICE DAILY   midodrine 2.5 MG tablet Commonly known as:  PROAMATINE TAKE 1 TABLET BY MOUTH 3 TIMES DAILY WITH MEALS.   promethazine 12.5 MG tablet Commonly known as:  PHENERGAN Take 1-2 tablets (12.5-25 mg total) by mouth every 8 (eight) hours as needed for nausea or vomiting.     pyridostigmine 60 MG tablet Commonly known as:  MESTINON TAKE 1 AND 1/2 TABLETS BYMOUTH THREE TIMES A DAY   traMADol 50 MG tablet Commonly known as:  ULTRAM Take 1 tablet (50 mg total) by mouth every 12 (twelve) hours as needed for severe pain.          Objective:   Physical Exam BP 124/68 (BP Location: Left Arm, Patient Position: Sitting, Cuff Size: Small)   Pulse 61   Temp 97.7 F (36.5 C) (Oral) Comment: has taken Tylenol  Resp 14   Ht 5\' 4"  (1.626 m)   Wt 117 lb 6 oz (53.2 kg)   SpO2 96%   BMI 20.15 kg/m  General:   Well developed, elderly lady, slightly underweight appearing. NAD.  HEENT:  Normocephalic . Face symmetric, atraumatic.  Nose congested, sinuses non-TTP.  TMs obscured by wax, partially visualized but normal. Throat symmetric, no red Lungs:  CTA B Normal respiratory effort, no intercostal retractions, no accessory muscle use. Heart: RRR,  no murmur.  No pretibial edema bilaterally  Skin: Not pale. Not jaundice Neurologic:  alert & oriented X3.  Speech normal, gait appropriate for age and unassisted Psych--  Cognition and judgment appear intact.  Cooperative with normal attention span and concentration.  Behavior appropriate. No anxious or depressed appearing.      Assessment & Plan:    Assessment Prediabetes  HTN Hyperlipidemia Anxiety, insomnia-- xanax, UDS low risk 07-2015 Menopausal; DEXA 2012 -1.5 @ gyn Recurrent Syncope -- extensive cards eval before, DX with autonomic insufficiency ~ 02-2015 --on a  abd binder , pyridostigmine, rx midodrine 06-2015 per cards  --symptomatic bradycardia 2:1-3:1 block, admitted 02-21-2016-- pacemaker placed-- no MRI x 6 weeks  --LBBB Traumatic SAH (after a syncope),TBI--- admitted 01-2015 , d/c to a rehab unit temporarily -- was rx seroquel (aparently for ICU delirium) -- d/c 07-2015 --rx topamax when at rehab for post [facility]  HAs -- had issues w/b/b incontinence, better as of 07-2015 Tremors LUE, onset 9  -2017  PLAN  URI: Symptoms started 4 days ago, will treat symptomatically, see AVS.  No pneumonia or influenza on clinical grounds Epigastric pain: Ongoing problem, follow-up at [facility], s/p endoscopic ultrasound 01-2017, has a gastric emptying study pending.  From my side, I have prescribed Ultram sporadically for pain.  She is not taking hydrocodone, will deleted from his her med list. Anxiety, insomnia: On Xanax, UDS and contract today.    RTC 4 months

## 2017-04-16 NOTE — Progress Notes (Signed)
Pre visit review using our clinic review tool, if applicable. No additional management support is needed unless otherwise documented below in the visit note. 

## 2017-04-16 NOTE — Patient Instructions (Signed)
GO TO THE LAB : Urine sample   GO TO THE FRONT DESK Schedule your next appointment for a checkup in 4 months    Rest, fluids , tylenol  If cough:  Take Mucinex DM twice a day as needed until better  For nasal congestion: Use OTC  Flonase : 2 nasal sprays on each side of the nose in the morning until you feel better Use ASTELIN a prescribed spray : 2 nasal sprays on each side of the nose at night until you feel better   Avoid decongestants such as  Pseudoephedrine or phenylephrine    Call if not gradually better over the next  10 days  Call anytime if the symptoms are severe

## 2017-04-17 NOTE — Assessment & Plan Note (Signed)
URI: Symptoms started 4 days ago, will treat symptomatically, see AVS.  No pneumonia or influenza on clinical grounds Epigastric pain: Ongoing problem, follow-up at [facility], s/p endoscopic ultrasound 01-2017, has a gastric emptying study pending.  From my side, I have prescribed Ultram sporadically for pain.  She is not taking hydrocodone, will deleted from his her med list. Anxiety, insomnia: On Xanax, UDS and contract today.    RTC 4 months

## 2017-04-20 LAB — PAIN MGMT, PROFILE 8 W/CONF, U
6 Acetylmorphine: NEGATIVE ng/mL (ref <10)
ALCOHOL METABOLITES: NEGATIVE ng/mL (ref <500)
ALPHAHYDROXYALPRAZOLAM: 435 ng/mL — AB (ref <25)
AMINOCLONAZEPAM: NEGATIVE ng/mL (ref <25)
Alphahydroxymidazolam: NEGATIVE ng/mL (ref <50)
Alphahydroxytriazolam: NEGATIVE ng/mL (ref <50)
Amphetamines: NEGATIVE ng/mL (ref <500)
BUPRENORPHINE, URINE: NEGATIVE ng/mL (ref <5)
Benzodiazepines: POSITIVE ng/mL — AB (ref <100)
COCAINE METABOLITE: NEGATIVE ng/mL (ref <150)
CREATININE: 132.7 mg/dL
Hydroxyethylflurazepam: NEGATIVE ng/mL (ref <50)
LORAZEPAM: NEGATIVE ng/mL (ref <50)
MDMA: NEGATIVE ng/mL (ref <500)
Marijuana Metabolite: NEGATIVE ng/mL (ref <20)
Nordiazepam: NEGATIVE ng/mL (ref <50)
Opiates: NEGATIVE ng/mL (ref <100)
Oxazepam: NEGATIVE ng/mL (ref <50)
Oxidant: NEGATIVE ug/mL (ref <200)
Oxycodone: NEGATIVE ng/mL (ref <100)
TEMAZEPAM: NEGATIVE ng/mL (ref <50)
pH: 6.42 (ref 4.5–9.0)

## 2017-05-03 ENCOUNTER — Other Ambulatory Visit: Payer: Self-pay | Admitting: Neurology

## 2017-05-05 ENCOUNTER — Telehealth: Payer: Self-pay | Admitting: Neurology

## 2017-05-05 ENCOUNTER — Other Ambulatory Visit: Payer: Self-pay | Admitting: Neurology

## 2017-05-05 MED ORDER — CARBIDOPA-LEVODOPA 25-100 MG PO TABS: 1.0000 | ORAL_TABLET | Freq: Three times a day (TID) | ORAL | 0 refills | Status: DC

## 2017-05-05 NOTE — Telephone Encounter (Signed)
Happy to refill, but she needs to see Np when available.

## 2017-05-05 NOTE — Telephone Encounter (Signed)
Pt called she needs refill forcarbidopa-levodopa (SINEMET IR) 25-100 MG tablet. She has been out of the medication around Dec but the "shaking" has started up again. Pt also said she is having intermittent nausea and will have an endoscopy this Friday by Dr Newman Pies in [facility]. Can this patient see a NP, she needs an afternoon appt.Marland KitchenMarland Kitchen

## 2017-05-07 DIAGNOSIS — R634 Abnormal weight loss: Secondary | ICD-10-CM | POA: Diagnosis not present

## 2017-05-07 DIAGNOSIS — R110 Nausea: Secondary | ICD-10-CM | POA: Diagnosis not present

## 2017-05-07 DIAGNOSIS — K3184 Gastroparesis: Secondary | ICD-10-CM | POA: Diagnosis not present

## 2017-05-13 ENCOUNTER — Other Ambulatory Visit: Payer: Self-pay | Admitting: Internal Medicine

## 2017-05-13 ENCOUNTER — Other Ambulatory Visit: Payer: Self-pay | Admitting: Cardiology

## 2017-05-13 DIAGNOSIS — R011 Cardiac murmur, unspecified: Secondary | ICD-10-CM

## 2017-05-19 ENCOUNTER — Telehealth: Payer: Self-pay | Admitting: Internal Medicine

## 2017-05-19 NOTE — Telephone Encounter (Signed)
Pt is requesting refill on alprazolam.   Last OV: 04/16/2017 Last Fill: 03/02/2017 #60 and 1RF UDS: 04/16/2017 Low risk  NCCR printed- 04/16/2017- no issues noted  Please advise.

## 2017-05-19 NOTE — Telephone Encounter (Signed)
Sent!

## 2017-05-20 DIAGNOSIS — K30 Functional dyspepsia: Secondary | ICD-10-CM | POA: Diagnosis not present

## 2017-05-20 DIAGNOSIS — R1013 Epigastric pain: Secondary | ICD-10-CM | POA: Diagnosis not present

## 2017-05-20 DIAGNOSIS — R110 Nausea: Secondary | ICD-10-CM | POA: Diagnosis not present

## 2017-05-31 ENCOUNTER — Other Ambulatory Visit: Payer: Self-pay | Admitting: Cardiology

## 2017-06-10 ENCOUNTER — Ambulatory Visit (INDEPENDENT_AMBULATORY_CARE_PROVIDER_SITE_OTHER): Payer: PPO | Admitting: *Deleted

## 2017-06-10 DIAGNOSIS — I459 Conduction disorder, unspecified: Secondary | ICD-10-CM | POA: Diagnosis not present

## 2017-06-10 NOTE — Telephone Encounter (Signed)
Pt scheduled with Janett Billow one time only for medication f/u. Anell Barr booked into May as well as Dr D. Pt is aware she will see Janett Billow this one time  [facility], [place]

## 2017-06-11 NOTE — Progress Notes (Signed)
Remote pacemaker transmission.   

## 2017-06-14 ENCOUNTER — Ambulatory Visit: Payer: PPO | Admitting: Adult Health

## 2017-06-14 ENCOUNTER — Encounter: Payer: Self-pay | Admitting: Adult Health

## 2017-06-14 VITALS — BP 143/73 | HR 77 | Ht 64.0 in | Wt 108.4 lb

## 2017-06-14 DIAGNOSIS — G252 Other specified forms of tremor: Secondary | ICD-10-CM

## 2017-06-14 DIAGNOSIS — E785 Hyperlipidemia, unspecified: Secondary | ICD-10-CM | POA: Diagnosis not present

## 2017-06-14 DIAGNOSIS — I10 Essential (primary) hypertension: Secondary | ICD-10-CM

## 2017-06-14 MED ORDER — CARBIDOPA-LEVODOPA 25-100 MG PO TABS: 1.0000 | ORAL_TABLET | Freq: Three times a day (TID) | ORAL | 4 refills | Status: DC

## 2017-06-14 NOTE — Progress Notes (Signed)
SLEEP MEDICINE CLINIC   Provider:  Larey Seat, M D  Referring Provider: Colon Branch, MD Primary Care Physician:  Colon Branch, MD  No chief complaint on file.   HPI:  Mandy Durham is a 72 y.o. female , seen here as a referral  from Dr. Kathlene November for a subarachnoid traumatic bleed , sequelae evaluation.   Mrs Zaldivar had originally asked for this appointment due to incontinence following a SAH. She had fainted, fell and aquiered the TBI with SAH. She states that the incontinence  is improved. She still has some urine leakage and soft stools. She wonders if this is still a neurologic impairment from the [facility]. She  has no pain with bowel movements, currently no headaches or nausea. She remains on Mestinon to protect her from fainting spells.  History from 02/12/2016 CD: I have originally seen Mrs. Jeune for one visit in February of 2016,  She has continued to have difficulties since suffering a sub-arachnoid traumatic hemorrhage. She states that ever since she had the fall she has sometimes leaked nasal clear fluid and this discharge had increased over the last couple of month and weeks. She also has more headaches. Her primary care physician, Dr. past, called me yesterday and asked if I could see her on an urgent basis. There is another very evident development : she has a significant tremor in both upper extremities and she sits here with her legs crossed and holds her left hand with her right to suppress the amplitude of movement. The subarachnoid hemorrhage affected the right brain, her trauma begun in the left hand over the last 3 months it has exacerbated to involve both upper extremities and it has a flapping and sometimes pill-rolling movement characteristic. There was no rigor over her biceps and her last visit. Her headaches are located right between the eyes above the root of the nose and she feels dizzy and lightheaded. The dizziness is a new phenomenon, the headaches preceded the  dizziness. Some of her headaches are associated with nausea, the headaches do not radiate to other parts of the face or skull, she does not have visual changes with headaches. She does not have photophobia. She does not wake up with these headaches.   Interval history 06/14/17: Patient is being seen today for follow-up medication refill appointment.  Patient has been doing well without noticeable action or resting tremors.  She does state when she is walking at times she will feel her hands bilaterally start to tremor but this is only on occasion and is mild.  She continues to take Sinemet 25-100 3 times a day without side effects.  She has been having multiple stomach issues regarding nausea but has been is being followed by a GI specialist.  Denies dizziness, lightheadedness or headaches.  No new concerns at today's appointment and would like a one-year refill for her Sinemet.   Review of Systems: Out of a complete 14 system review, the patient complains of only the following symptoms, and all other reviewed systems are negative.  Tremors   Social History   Socioeconomic History  . Marital status: Married    Spouse name: [name]  . Number of children: 2  . Years of education: Not on file  . Highest education level: Not on file  Occupational History  . Occupation: retired Environmental consultant: [employer]  . Financial resource strain: Not on file  . Food insecurity:  Worry: Not on file    Inability: Not on file  . Transportation needs:    Medical: Not on file    Non-medical: Not on file  Tobacco Use  . Smoking status: Never Smoker  . Smokeless tobacco: Never Used  Substance and Sexual Activity  . Alcohol use: No  . Drug use: No  . Sexual activity: Not on file  Lifestyle  . Physical activity:    Days per week: Not on file    Minutes per session: Not on file  . Stress: Not on file  Relationships  . Social connections:    Talks on  phone: Not on file    Gets together: Not on file    Attends religious service: Not on file    Active member of club or organization: Not on file    Attends meetings of clubs or organizations: Not on file    Relationship status: Not on file  . Intimate partner violence:    Fear of current or ex partner: Not on file    Emotionally abused: Not on file    Physically abused: Not on file    Forced sexual activity: Not on file  Other Topics Concern  . Not on file  Social History Narrative   HSG.  Married '65.  2 daughters, '71, '77; 4 grandchildren          Family History  Problem Relation Age of Onset  . Heart failure Mother   . Coronary artery disease Mother   . Dementia Mother   . Diabetes Mother   . Colon cancer Neg Hx   . Breast cancer Neg Hx   . Hypertension Neg Hx   . Heart disease Neg Hx   . Heart attack Neg Hx   . Stroke Neg Hx     Past Medical History:  Diagnosis Date  . Anxiety state, unspecified   . Hyperlipidemia   . Hypertension   . Insomnia   . LBBB (left bundle branch block)    LHC in 2002 showed normal coronaries.   . Osteoarthrosis, unspecified whether generalized or localized, unspecified site   . Overweight(278.02)   . S/P placement of cardiac pacemaker    a. 02/21/16: Medtronic Advisa DR MRI SureScan model [id number] (serial number [id number])   . Serrated adenoma of colon 2007  . Symptomatic menopausal or female climacteric states   . Syncope and collapse    11/12: Holter 12/12 with rare PACS, HR range 64-140, average 82, no significant arrhythmias. Echo (1/13): EF 50-55%, mild LVH, septal-lateral dyssynchrony c/w LBBB. 3-week event monitor (1/13): No significant arrhythmia.     Past Surgical History:  Procedure Laterality Date  . EP IMPLANTABLE DEVICE N/A 02/21/2016   Procedure: Pacemaker Implant;  Surgeon: Will Meredith Leeds, MD;  Location: [place] [facility];  Service: Cardiovascular;  Laterality: N/A;  . TUBAL LIGATION      Current Outpatient  Medications  Medication Sig Dispense Refill  . acetaminophen (TYLENOL) 500 MG tablet Take 500 mg by mouth every 6 (six) hours as needed.    . ALPRAZolam (XANAX) 0.25 MG tablet TAKE 1 TO 2 TABLETS BY MOUTH AT BEDTIME AS NEEDED 60 tablet 2  . azelastine (ASTELIN) 0.1 % nasal spray Place 2 sprays into both nostrils at bedtime as needed for rhinitis. Use in each nostril as directed 30 mL 3  . carbidopa-levodopa (SINEMET IR) 25-100 MG tablet Take 1 tablet by mouth 3 (three) times daily. 180 tablet 0  . escitalopram (  LEXAPRO) 5 MG tablet TAKE 1 TABLET BY MOUTH ONCE DAILY 30 tablet 0  . ezetimibe (ZETIA) 10 MG tablet Take 1 tablet (10 mg total) by mouth daily. 90 tablet 1  . ipratropium (ATROVENT) 0.06 % nasal spray Place 1-3 sprays into both nostrils 3 (three) times daily.     . metoprolol tartrate (LOPRESSOR) 25 MG tablet TAKE 1 TABLET BY MOUTH TWICE DAILY 60 tablet 0  . midodrine (PROAMATINE) 2.5 MG tablet TAKE 1 TABLET BY MOUTH 3 TIMES DAILY WITH MEALS. 270 tablet 1  . promethazine (PHENERGAN) 12.5 MG tablet Take 1-2 tablets (12.5-25 mg total) by mouth every 8 (eight) hours as needed for nausea or vomiting. 30 tablet 0  . pyridostigmine (MESTINON) 60 MG tablet TAKE 1&1/2 TABLETS BY MOUTH 3 TIMES DAILY 135 tablet 0  . traMADol (ULTRAM) 50 MG tablet Take 1 tablet (50 mg total) by mouth every 12 (twelve) hours as needed for severe pain. 20 tablet 0   No current facility-administered medications for this visit.     Allergies as of 06/14/2017  . (No Known Allergies)    Vitals: There were no vitals taken for this visit. Last Weight:  Wt Readings from Last 1 Encounters:  04/16/17 117 lb 6 oz (53.2 kg)   ONG:EXBMW is no height or weight on file to calculate BMI.     Last Height:   Ht Readings from Last 1 Encounters:  04/16/17 5\' 4"  (1.626 m)    Physical exam:  General: The patient is awake, alert and appears not in acute distress. The patient is well groomed.  Pleasant elderly Caucasian  female. Head: Normocephalic, atraumatic.  High grade Retrognathia is seen.  Cardiovascular:  Regular rate and rhythm, without  murmurs or carotid bruit, and without distended neck veins. Respiratory: Lungs are clear to auscultation. Skin:  Without evidence of edema, or rash Trunk: BMI is normal  The patient's posture is erect  Neurologic exam : The patient is awake and alert, oriented to place and time.   Memory subjective described as intact.  Attention span & concentration ability appears normal.  Speech is mild stuttering, non fluent,  without dysarthria, dysphonia .  Mood and affect are appropriate.  Cranial nerves: Pupils are equal and briskly reactive to light.  Funduscopic exam without evidence of pallor or edema.  Visual fields by finger perimetry are intact. Hearing to finger rub intact.   Facial sensation intact to fine touch.  Facial motor strength is symmetric and tongue and uvula move midline. Shoulder shrug was symmetrical.   Motor exam:   5/5 strength in all 4 extremities  Sensory:  Fine touch, pinprick and vibration, Proprioception normal.  Coordination: Rapid alternating movements in the fingers/hands was normal. Finger-to-nose maneuver; no tremor observed  Gait and station: Gait is smooth with normal stride.  No tremors observed while ambulating.  Stance is normal  Deep tendon reflexes: in the  upper and lower extremities are symmetric and intact.     Assessment:  Ms. Worsley is a 72 year old female with a history of SAH, TBI, HLD, HTN and tremor. Patient was on a beta blocker, benzodiaepine, topiramate and mysoline for her tremors. She was unable to tolerate mysoline and was started on Sinemet 25/100 TID.  She returns today for medication refill appointment has been doing well overall without observed tremors at today's appointment.    Plan:   -Continue sinemet 25-100 three times per day -Continue to stay active and eat healthy -Follow up in 1 year  or call  earlier if needed with Jinny Blossom, NP -Advised patient to speak to PCP regarding taking aspirin 81 mg daily for stroke prevention as patient does have HLD and HTN   Greater than 50% time during this 25  minute consultation visit was spent on counseling and coordination of care about tremors and use of Sinemet.  Educated on importance of exercise and maintaining a healthy diet.  Patient has no questions or concerns at today's appointment.  Advised patient to  Venancio Poisson, [facility]  [facility] [address] [place], [place] [address]  Phone [phone] Fax [phone]

## 2017-06-14 NOTE — Patient Instructions (Addendum)
Your Plan:  Continue sinemet 25-100 three times per day  Continue to stay active and eat healthy  Follow up in 1 year or call earlier if needed with Jinny Blossom, NP    Thank you for coming to see [place] at [facility]. I hope we have been able to provide you high quality care today.  You may receive a patient satisfaction survey over the next few weeks. We would appreciate your feedback and comments so that we may continue to improve ourselves and the health of our patients.

## 2017-06-15 ENCOUNTER — Encounter: Payer: Self-pay | Admitting: Cardiology

## 2017-06-15 ENCOUNTER — Ambulatory Visit: Payer: PPO | Admitting: Cardiology

## 2017-06-15 VITALS — BP 144/72 | HR 63 | Ht 65.0 in | Wt 119.6 lb

## 2017-06-15 DIAGNOSIS — I441 Atrioventricular block, second degree: Secondary | ICD-10-CM | POA: Diagnosis not present

## 2017-06-15 DIAGNOSIS — I10 Essential (primary) hypertension: Secondary | ICD-10-CM | POA: Diagnosis not present

## 2017-06-15 DIAGNOSIS — E785 Hyperlipidemia, unspecified: Secondary | ICD-10-CM | POA: Diagnosis not present

## 2017-06-15 NOTE — Progress Notes (Signed)
Electrophysiology Office Note   Date:  06/15/2017   ID:  Mandy Durham 1945-07-23, MRN [id number]  PCP:  Colon Branch, MD  Cardiologist:  Aundra Dubin Primary Electrophysiologist:  Will Meredith Leeds, MD    Chief Complaint  Patient presents with  . Pacemaker Check    Complete hear block/Mobitz 2     History of Present Illness: Mandy Durham is a 72 y.o. female who presents today for electrophysiology evaluation.   Presented to the hospital in 3:1 AV block. Metoprolol washed out with continued block and the patient received a Medtronic dual chamber pacemaker.  Today, denies symptoms of palpitations, chest pain, shortness of breath, orthopnea, PND, lower extremity edema, claudication, dizziness, presyncope, syncope, bleeding, or neurologic sequela. The patient is tolerating medications without difficulties.  She has been having issues with her GI tract.  She has been having chronic nausea and vomiting.  She has had multiple workups with GI without any cause found.  I have told her that she can try over-the-counter antacids to see if this will help.   Past Medical History:  Diagnosis Date  . Anxiety state, unspecified   . Hyperlipidemia   . Hypertension   . Insomnia   . LBBB (left bundle branch block)    LHC in 2002 showed normal coronaries.   . Osteoarthrosis, unspecified whether generalized or localized, unspecified site   . Overweight(278.02)   . S/P placement of cardiac pacemaker    a. 02/21/16: Medtronic Advisa DR MRI SureScan model [id number] (serial number [id number])   . Serrated adenoma of colon 2007  . Symptomatic menopausal or female climacteric states   . Syncope and collapse    11/12: Holter 12/12 with rare PACS, HR range 64-140, average 82, no significant arrhythmias. Echo (1/13): EF 50-55%, mild LVH, septal-lateral dyssynchrony c/w LBBB. 3-week event monitor (1/13): No significant arrhythmia.    Past Surgical History:  Procedure Laterality Date  . EP IMPLANTABLE  DEVICE N/A 02/21/2016   Procedure: Pacemaker Implant;  Surgeon: Will Meredith Leeds, MD;  Location: [place] [facility];  Service: Cardiovascular;  Laterality: N/A;  . TUBAL LIGATION       Current Outpatient Medications  Medication Sig Dispense Refill  . acetaminophen (TYLENOL) 500 MG tablet Take 500 mg by mouth every 6 (six) hours as needed.    . ALPRAZolam (XANAX) 0.25 MG tablet TAKE 1 TO 2 TABLETS BY MOUTH AT BEDTIME AS NEEDED 60 tablet 2  . azelastine (ASTELIN) 0.1 % nasal spray Place 2 sprays into both nostrils at bedtime as needed for rhinitis. Use in each nostril as directed 30 mL 3  . carbidopa-levodopa (SINEMET IR) 25-100 MG tablet Take 1 tablet by mouth 3 (three) times daily. 270 tablet 4  . escitalopram (LEXAPRO) 5 MG tablet TAKE 1 TABLET BY MOUTH ONCE DAILY 30 tablet 0  . ezetimibe (ZETIA) 10 MG tablet Take 1 tablet (10 mg total) by mouth daily. 90 tablet 1  . ipratropium (ATROVENT) 0.06 % nasal spray Place 1-3 sprays into both nostrils 3 (three) times daily.     . metoprolol tartrate (LOPRESSOR) 25 MG tablet TAKE 1 TABLET BY MOUTH TWICE DAILY 60 tablet 0  . midodrine (PROAMATINE) 2.5 MG tablet TAKE 1 TABLET BY MOUTH 3 TIMES DAILY WITH MEALS. 270 tablet 1  . promethazine (PHENERGAN) 12.5 MG tablet Take 1-2 tablets (12.5-25 mg total) by mouth every 8 (eight) hours as needed for nausea or vomiting. 30 tablet 0  . pyridostigmine (MESTINON) 60 MG  tablet TAKE 1&1/2 TABLETS BY MOUTH 3 TIMES DAILY 135 tablet 0  . traMADol (ULTRAM) 50 MG tablet Take 1 tablet (50 mg total) by mouth every 12 (twelve) hours as needed for severe pain. 20 tablet 0   No current facility-administered medications for this visit.     Allergies:   Patient has no known allergies.   Social History:  The patient  reports that she has never smoked. She has never used smokeless tobacco. She reports that she does not drink alcohol or use drugs.   Family History:  The patient's family history includes Coronary artery  disease in her mother; Dementia in her mother; Diabetes in her mother; Heart failure in her mother.    ROS:  Please see the history of present illness.   Otherwise, review of systems is positive for none.   All other systems are reviewed and negative.   PHYSICAL EXAM: VS:  BP (!) 144/72   Pulse 63   Ht 5\' 5"  (1.651 m)   Wt 119 lb 9.6 oz (54.3 kg)   SpO2 97%   BMI 19.90 kg/m  , BMI Body mass index is 19.9 kg/m. GEN: Well nourished, well developed, in no acute distress  HEENT: normal  Neck: no JVD, carotid bruits, or masses Cardiac: RRR; no murmurs, rubs, or gallops,no edema  Respiratory:  clear to auscultation bilaterally, normal work of breathing GI: soft, nontender, nondistended, + BS MS: no deformity or atrophy  Skin: warm and dry, device site well healed Neuro:  Strength and sensation are intact Psych: euthymic mood, full affect  EKG:  EKG is ordered today. Personal review of the ekg ordered shows A sense, V pace  Personal review of the device interrogation today. Results in [place]: 11/25/2016: ALT 14; BUN 13; Hemoglobin 14.6; Platelets 306.0; Potassium 4.0; Sodium 139 12/08/2016: Creatinine, Ser 0.79    Lipid Panel     Component Value Date/Time   CHOL 289 (H) 01/13/2016 0950   TRIG 121.0 01/13/2016 0950   HDL 101.50 01/13/2016 0950   CHOLHDL 3 01/13/2016 0950   VLDL 24.2 01/13/2016 0950   LDLCALC 163 (H) 01/13/2016 0950   LDLDIRECT 106.9 11/03/2012 0913     Wt Readings from Last 3 Encounters:  06/15/17 119 lb 9.6 oz (54.3 kg)  06/14/17 108 lb 6.4 oz (49.2 kg)  04/16/17 117 lb 6 oz (53.2 kg)      Other studies Reviewed: Additional studies/ records that were reviewed today include: TTE 07/17/15  Review of the above records today demonstrates:  - Left ventricle: The cavity size was normal. There was severe   focal basal hypertrophy of the septum. Systolic function was   normal. The estimated ejection fraction was in the range of 50%   to 55%.  Anteroseptal hypokinesis. Doppler parameters are   consistent with abnormal left ventricular relaxation (grade 1   diastolic dysfunction). The E/e&' ratio is between 8-15,   suggesting indeterminate LV filling pressure. - Mitral valve: Mildly thickened leaflets . There was trivial   regurgitation. - Left atrium: The atrium was normal in size. - Tricuspid valve: There was mild regurgitation. - Pulmonary arteries: PA peak pressure: 32 mm Hg (S). - Inferior vena cava: The vessel was normal in size. The   respirophasic diameter changes were in the normal range (>= 50%),   consistent with normal central venous pressure.   ASSESSMENT AND PLAN:  1.  3:1 AV block: Medtronic dual-chamber pacemaker implanted 02/21/16.  Device functioning appropriately.  No  changes.   2. Hypertension: Blood pressure well controlled.  She is currently on medications for hypotension.  We will stop her Mestinon, Midrin.  3. Hyperlipidemia: Continue current management with Zetia.   Current medicines are reviewed at length with the patient today.   The patient does not have concerns regarding her medicines.  The following changes were made today:  stop Mestinon, Midrin  Labs/ tests ordered today include:  Orders Placed This Encounter  Procedures  . EKG 12-Lead     Disposition:   FU with Will Camnitz 6 months  Signed, Will Meredith Leeds, MD  06/15/2017 4:09 PM     [address] [place] [address] [phone] (office) [phone] (fax)

## 2017-06-15 NOTE — Progress Notes (Signed)
I agree with the assessment and plan as directed by NP .The patient was followed for Stroke, not Sleep medicine on today's appointment and is known to me .   Anara Cowman, MD

## 2017-06-15 NOTE — Patient Instructions (Addendum)
Medication Instructions: STOP Mestinon  STOP Midodrine  Labwork: None ordered  Procedures/Testing: None ordered  Follow-Up: Your physician wants you to follow-up in: 6 months with Dr. Curt Bears.   You will receive a reminder letter in the mail two months in advance. If you don't receive a letter, please call our office to schedule the follow-up appointment.  If you need a refill on your cardiac medications before your next appointment, please call your pharmacy.    Any Additional Special Instructions Will Be Listed Below (If Applicable).  Please call your primary care doctor if you have any blood pressure issues. Systolic (top number) greater than 140

## 2017-06-22 LAB — CUP PACEART REMOTE DEVICE CHECK
Brady Statistic AP VP Percent: 9.18 %
Brady Statistic AP VS Percent: 0.89 %
Brady Statistic AS VP Percent: 78.61 %
Brady Statistic AS VS Percent: 11.32 %
Brady Statistic RV Percent Paced: 87.75 %
Implantable Lead Implant Date: 20171208
Implantable Lead Implant Date: 20171208
Implantable Lead Location: 753859
Implantable Lead Model: 5076
Lead Channel Impedance Value: 399 Ohm
Lead Channel Impedance Value: 475 Ohm
Lead Channel Impedance Value: 551 Ohm
Lead Channel Pacing Threshold Amplitude: 0.5 V
Lead Channel Sensing Intrinsic Amplitude: 18.625 mV
Lead Channel Setting Pacing Amplitude: 2 V
Lead Channel Setting Pacing Amplitude: 2.5 V
Lead Channel Setting Sensing Sensitivity: 4 mV
MDC IDC LEAD LOCATION: 753860
MDC IDC MSMT BATTERY REMAINING LONGEVITY: 94 mo
MDC IDC MSMT BATTERY VOLTAGE: 3.01 V
MDC IDC MSMT LEADCHNL RA IMPEDANCE VALUE: 399 Ohm
MDC IDC MSMT LEADCHNL RA PACING THRESHOLD AMPLITUDE: 0.875 V
MDC IDC MSMT LEADCHNL RA PACING THRESHOLD PULSEWIDTH: 0.4 ms
MDC IDC MSMT LEADCHNL RA SENSING INTR AMPL: 2.5 mV
MDC IDC MSMT LEADCHNL RA SENSING INTR AMPL: 2.5 mV
MDC IDC MSMT LEADCHNL RV PACING THRESHOLD PULSEWIDTH: 0.4 ms
MDC IDC MSMT LEADCHNL RV SENSING INTR AMPL: 18.625 mV
MDC IDC PG IMPLANT DT: 20171208
MDC IDC SESS DTM: 20190328191458
MDC IDC SET LEADCHNL RV PACING PULSEWIDTH: 0.4 ms
MDC IDC STAT BRADY RA PERCENT PACED: 10.07 %

## 2017-06-23 ENCOUNTER — Other Ambulatory Visit: Payer: Self-pay | Admitting: Cardiology

## 2017-06-23 ENCOUNTER — Telehealth: Payer: Self-pay | Admitting: Internal Medicine

## 2017-06-23 DIAGNOSIS — R011 Cardiac murmur, unspecified: Secondary | ICD-10-CM

## 2017-06-23 NOTE — Telephone Encounter (Signed)
Pt is requesting refill on Tramadol 50mg .   Last OV: 04/16/2017 Last Fill: 02/16/2017 #20 and 0RF UDS: 04/16/2017 Low risk  NCCR printed 04/16/2017- no discrepancies noted- in media  Please advise.

## 2017-06-23 NOTE — Telephone Encounter (Signed)
Sent!

## 2017-07-13 ENCOUNTER — Other Ambulatory Visit: Payer: Self-pay | Admitting: Cardiology

## 2017-07-15 ENCOUNTER — Telehealth: Payer: Self-pay | Admitting: Cardiology

## 2017-07-15 NOTE — Telephone Encounter (Signed)
Pt calls in asking for refills on Midodrine.  (Pt and I spoke on 07/01/17 @ 1:10pm. Pt never stopped Midodrine.  BPs at home were still low, so she remained on it.  Not sure if BP cuff is reading correctly or not. Advised pt then that she needed to have cuff checked for accuracy before we can make further recommendation/s)  Today: Advised pt she needs to see Dr. Larose Kells by next week to address manual BP to compare to her personal monitor.  Advised that I would follow up in the next week/two to determine plan. Patient verbalized understanding and agreeable to plan.

## 2017-07-15 NOTE — Telephone Encounter (Signed)
New Message  Pt c/o medication issue:  1. Name of Medication: midodrine  2. How are you currently taking this medication (dosage and times per day)? 2.5 mg  3. Are you having a reaction (difficulty breathing--STAT)? no  4. What is your medication issue? Pt states she needs help with medication. Please call

## 2017-07-19 ENCOUNTER — Other Ambulatory Visit: Payer: Self-pay

## 2017-07-20 ENCOUNTER — Ambulatory Visit (INDEPENDENT_AMBULATORY_CARE_PROVIDER_SITE_OTHER): Payer: PPO | Admitting: Internal Medicine

## 2017-07-20 ENCOUNTER — Encounter: Payer: Self-pay | Admitting: Internal Medicine

## 2017-07-20 VITALS — BP 140/80 | HR 72 | Temp 97.5°F | Resp 14 | Ht 65.0 in | Wt 123.0 lb

## 2017-07-20 DIAGNOSIS — F419 Anxiety disorder, unspecified: Secondary | ICD-10-CM | POA: Diagnosis not present

## 2017-07-20 DIAGNOSIS — F329 Major depressive disorder, single episode, unspecified: Secondary | ICD-10-CM | POA: Diagnosis not present

## 2017-07-20 DIAGNOSIS — I10 Essential (primary) hypertension: Secondary | ICD-10-CM | POA: Diagnosis not present

## 2017-07-20 DIAGNOSIS — R739 Hyperglycemia, unspecified: Secondary | ICD-10-CM | POA: Diagnosis not present

## 2017-07-20 DIAGNOSIS — E782 Mixed hyperlipidemia: Secondary | ICD-10-CM

## 2017-07-20 DIAGNOSIS — R5383 Other fatigue: Secondary | ICD-10-CM

## 2017-07-20 MED ORDER — ESCITALOPRAM OXALATE 5 MG PO TABS: 5.0000 mg | ORAL_TABLET | Freq: Every day | ORAL | 6 refills | Status: DC

## 2017-07-20 MED ORDER — LORAZEPAM 0.5 MG PO TABS: 0.2500 mg | ORAL_TABLET | Freq: Every evening | ORAL | 0 refills | Status: DC | PRN

## 2017-07-20 NOTE — Progress Notes (Signed)
Subjective:    Patient ID: Mandy Durham, female    DOB: October 18, 1945, 72 y.o.   MRN: [id number]  DOS:  07/20/2017 Type of visit - description : Here for a BP check, she also has a number of other issues Interval history: -Saw cardiology 4/ 04/2017, BP was 144/72 they recommend to stop mestinon- Midodrine (Rx previously for h/o  syncope, autonomic dysfunction).  She was recommended to check her BPs but she is not doing it b/c she does not know if her manometer is calibrated. -Depression: Was prescribed Lexapro several months ago, she does not think she is taking it, she feels sad, depressed at times -Insomnia: Takes a quarter of Xanax at night, reports  visual hallucinations at night, thinks related to Xanax, also reports is not helping her insomnia and would like to change it. -Reports decreased appetite, no weight loss noted.    Wt Readings from Last 3 Encounters:  07/20/17 123 lb (55.8 kg)  06/15/17 119 lb 9.6 oz (54.3 kg)  06/14/17 108 lb 6.4 oz (49.2 kg)     Review of Systems Reports fatigue for several months, "feeling bad". Denies fever, chills. No chest pain, difficulty breathing, edema. No headaches or unusual aches or pains No suicidal ideas No orthostatic symptoms.  Very seldom feels slightly dizzy.  Past Medical History:  Diagnosis Date  . Anxiety state, unspecified   . Hyperlipidemia   . Hypertension   . Insomnia   . LBBB (left bundle branch block)    LHC in 2002 showed normal coronaries.   . Osteoarthrosis, unspecified whether generalized or localized, unspecified site   . Overweight(278.02)   . S/P placement of cardiac pacemaker    a. 02/21/16: Medtronic Advisa DR MRI SureScan model [id number] (serial number [id number])   . Serrated adenoma of colon 2007  . Symptomatic menopausal or female climacteric states   . Syncope and collapse    11/12: Holter 12/12 with rare PACS, HR range 64-140, average 82, no significant arrhythmias. Echo (1/13): EF 50-55%, mild LVH,  septal-lateral dyssynchrony c/w LBBB. 3-week event monitor (1/13): No significant arrhythmia.     Past Surgical History:  Procedure Laterality Date  . EP IMPLANTABLE DEVICE N/A 02/21/2016   Procedure: Pacemaker Implant;  Surgeon: Will Meredith Leeds, MD;  Location: [facility];  Service: Cardiovascular;  Laterality: N/A;  . TUBAL LIGATION      Social History   Socioeconomic History  . Marital status: Married    Spouse name: [name]  . Number of children: 2  . Years of education: Not on file  . Highest education level: Not on file  Occupational History  . Occupation: retired Environmental consultant: [employer]  . Financial resource strain: Not on file  . Food insecurity:    Worry: Not on file    Inability: Not on file  . Transportation needs:    Medical: Not on file    Non-medical: Not on file  Tobacco Use  . Smoking status: Never Smoker  . Smokeless tobacco: Never Used  Substance and Sexual Activity  . Alcohol use: No  . Drug use: No  . Sexual activity: Not on file  Lifestyle  . Physical activity:    Days per week: Not on file    Minutes per session: Not on file  . Stress: Not on file  Relationships  . Social connections:    Talks on phone: Not on file    Gets  together: Not on file    Attends religious service: Not on file    Active member of club or organization: Not on file    Attends meetings of clubs or organizations: Not on file    Relationship status: Not on file  . Intimate partner violence:    Fear of current or ex partner: Not on file    Emotionally abused: Not on file    Physically abused: Not on file    Forced sexual activity: Not on file  Other Topics Concern  . Not on file  Social History Narrative   HSG.  Married '65.  2 daughters, '71, '77; 4 grandchildren            Allergies as of 07/20/2017   No Known Allergies     Medication List        Accurate as of 07/20/17  1:54 PM. Always use your  most recent med list.          acetaminophen 500 MG tablet Commonly known as:  TYLENOL Take 500 mg by mouth every 6 (six) hours as needed.   ALPRAZolam 0.25 MG tablet Commonly known as:  XANAX TAKE 1 TO 2 TABLETS BY MOUTH AT BEDTIME AS NEEDED   azelastine 0.1 % nasal spray Commonly known as:  ASTELIN Place 2 sprays into both nostrils at bedtime as needed for rhinitis. Use in each nostril as directed   carbidopa-levodopa 25-100 MG tablet Commonly known as:  SINEMET IR Take 1 tablet by mouth 3 (three) times daily.   escitalopram 5 MG tablet Commonly known as:  LEXAPRO TAKE 1 TABLET BY MOUTH ONCE DAILY   ezetimibe 10 MG tablet Commonly known as:  ZETIA Take 1 tablet (10 mg total) by mouth daily.   ipratropium 0.06 % nasal spray Commonly known as:  ATROVENT Place 1-3 sprays into both nostrils 3 (three) times daily.   metoprolol tartrate 25 MG tablet Commonly known as:  LOPRESSOR Take 1 tablet (25 mg total) by mouth 2 (two) times daily.   promethazine 12.5 MG tablet Commonly known as:  PHENERGAN Take 1-2 tablets (12.5-25 mg total) by mouth every 8 (eight) hours as needed for nausea or vomiting.   traMADol 50 MG tablet Commonly known as:  ULTRAM Take 1 tablet (50 mg total) by mouth every 12 (twelve) hours as needed for severe pain.          Objective:   Physical Exam BP 108/62 (BP Location: Left Arm, Patient Position: Sitting, Cuff Size: Small)   Pulse 72   Temp (!) 97.5 F (36.4 C) (Oral)   Resp 14   Ht 5\' 5"  (1.651 m)   Wt 123 lb (55.8 kg)   SpO2 97%   BMI 20.47 kg/m  General:   Well developed, well nourished . NAD.  HEENT:  Normocephalic . Face symmetric, atraumatic Lungs:  CTA B Normal respiratory effort, no intercostal retractions, no accessory muscle use. Heart: RRR,  no murmur.  No pretibial edema bilaterally  BP manually: Left arm 140/80, right arm 130/70. Skin: Not pale. Not jaundice Neurologic:  alert & oriented X3.  Speech : + Word  finding difficulty, at baseline, ; gait appropriate for age and unassisted Psych--  Cognition and judgment appear intact.  Cooperative with normal attention span and concentration.  Behavior appropriate. Looks apprehensive, slightly anxious but not depressed appearing.  This is her baseline.     Assessment & Plan:   Assessment Prediabetes  HTN Hyperlipidemia, statin intolerant Anxiety, insomnia-- xanax, UDS  low risk 07-2015 Menopausal; DEXA 2012 -1.5 @ gyn Recurrent Syncope -- extensive cards eval before, DX with autonomic insufficiency ~ 02-2015 --on a  abd binder , pyridostigmine, rx midodrine 06-2015 per cards  --symptomatic bradycardia 2:1-3:1 block, admitted 02-21-2016-- pacemaker placed-- no MRI x 6 weeks  --LBBB Traumatic SAH (after a syncope),TBI--- admitted 01-2015 , d/c to a rehab unit temporarily -- was rx seroquel (aparently for ICU delirium) -- d/c 07-2015 --rx topamax when at rehab for post [facility]  HAs -- had issues w/b/b incontinence, better as of 07-2015 Tremors LUE, onset 9 -2017  PLAN  Hyperglycemia: Check A1c HTN: Saw cardiology 4/ 04/2017, BP was 144/72 they recommend to stop mestinon- Midodrine (Rx previously for h/o  syncope, autonomic dysfunction).  Has not checked her ambulatory BPs, BP today is good at 140/80, 130/70.  Recommend to get a good quality manometer and check BPs at home.  Otherwise continue with metoprolol Pre diabetes: Check a A1c Hyperlipidemia: On Zetia, check FLP Anxiety: Was prescribed Lexapro by Dr. Tessa Lerner few months ago, currently not taking it, feels somewhat depressed but no suicidal ideas, would like to restart.  Rx sent Insomnia: On a very low-dose of Xanax, having visual hallucinations at night, thinks Xanax is the culprit and she is not sleeps well.  I'm not sure if Xanax is a culprit, we can switch from Xanax to Ativan, very low dose.  Also recommend to discuss with neurology hallucinations.  Related to her other medications?. Fatigue,not  feeling well: Checking TSH, CBC. RTC 6 weeks

## 2017-07-20 NOTE — Progress Notes (Signed)
Pre visit review using our clinic review tool, if applicable. No additional management support is needed unless otherwise documented below in the visit note. 

## 2017-07-20 NOTE — Patient Instructions (Addendum)
   GO TO THE FRONT DESK Schedule labs to be done fasting this week  Schedule your next appointment for a checkup in 6 weeks  Check the  blood pressure   daily Be sure your blood pressure is between 110/65 and  135/85. If it is consistently higher or lower, let me know   Stop Xanax Start Lorazepam half tablet at bedtime Call neurology regards your visual hallucinations

## 2017-07-21 NOTE — Assessment & Plan Note (Signed)
Hyperglycemia: Check A1c HTN: Saw cardiology 4/ 04/2017, BP was 144/72 they recommend to stop mestinon- Midodrine (Rx previously for h/o  syncope, autonomic dysfunction).  Has not checked her ambulatory BPs, BP today is good at 140/80, 130/70.  Recommend to get a good quality manometer and check BPs at home.  Otherwise continue with metoprolol Pre diabetes: Check a A1c Hyperlipidemia: On Zetia, check FLP Anxiety: Was prescribed Lexapro by Dr. Tessa Lerner few months ago, currently not taking it, feels somewhat depressed but no suicidal ideas, would like to restart.  Rx sent Insomnia: On a very low-dose of Xanax, having visual hallucinations at night, thinks Xanax is the culprit and she is not sleeps well.  I'm not sure if Xanax is a culprit, we can switch from Xanax to Ativan, very low dose.  Also recommend to discuss with neurology hallucinations.  Related to her other medications?. Fatigue,not feeling well: Checking TSH, CBC. RTC 6 weeks

## 2017-07-30 NOTE — Telephone Encounter (Signed)
Pt reports that she is no longer taking Midodrine. She does report going to her PCP and they compared manual BP to her automatic BP cuff.  Her cuff was reading differently from more accurate manual BP taken by office staff.  Patient is working with company that makes her cuff to see about calibrating/replacing. She will follow up w/ PCP if she experiences future BP issues or cuff issues. She appreciates the follow up.

## 2017-08-04 ENCOUNTER — Ambulatory Visit: Payer: Medicare HMO | Admitting: Adult Health

## 2017-08-16 ENCOUNTER — Ambulatory Visit: Payer: PPO | Admitting: Internal Medicine

## 2017-08-16 ENCOUNTER — Other Ambulatory Visit (INDEPENDENT_AMBULATORY_CARE_PROVIDER_SITE_OTHER): Payer: PPO

## 2017-08-16 DIAGNOSIS — R5383 Other fatigue: Secondary | ICD-10-CM

## 2017-08-16 DIAGNOSIS — E782 Mixed hyperlipidemia: Secondary | ICD-10-CM | POA: Diagnosis not present

## 2017-08-16 DIAGNOSIS — R739 Hyperglycemia, unspecified: Secondary | ICD-10-CM | POA: Diagnosis not present

## 2017-08-16 DIAGNOSIS — I10 Essential (primary) hypertension: Secondary | ICD-10-CM | POA: Diagnosis not present

## 2017-08-16 LAB — CBC WITH DIFFERENTIAL/PLATELET
Basophils Absolute: 0 10*3/uL (ref 0.0–0.1)
Basophils Relative: 0.6 % (ref 0.0–3.0)
Eosinophils Absolute: 0.1 10*3/uL (ref 0.0–0.7)
Eosinophils Relative: 2.6 % (ref 0.0–5.0)
HEMATOCRIT: 41.5 % (ref 36.0–46.0)
HEMOGLOBIN: 13.9 g/dL (ref 12.0–15.0)
LYMPHS PCT: 36.4 % (ref 12.0–46.0)
Lymphs Abs: 2 10*3/uL (ref 0.7–4.0)
MCHC: 33.6 g/dL (ref 30.0–36.0)
MCV: 86.4 fl (ref 78.0–100.0)
MONOS PCT: 9 % (ref 3.0–12.0)
Monocytes Absolute: 0.5 10*3/uL (ref 0.1–1.0)
NEUTROS PCT: 51.4 % (ref 43.0–77.0)
Neutro Abs: 2.8 10*3/uL (ref 1.4–7.7)
Platelets: 297 10*3/uL (ref 150.0–400.0)
RBC: 4.8 Mil/uL (ref 3.87–5.11)
RDW: 14 % (ref 11.5–15.5)
WBC: 5.5 10*3/uL (ref 4.0–10.5)

## 2017-08-16 LAB — TSH: TSH: 2.13 u[IU]/mL (ref 0.35–4.50)

## 2017-08-16 LAB — LIPID PANEL
CHOL/HDL RATIO: 2
Cholesterol: 237 mg/dL — ABNORMAL HIGH (ref 0–200)
HDL: 104.4 mg/dL (ref >39.00)
LDL CALC: 114 mg/dL — AB (ref 0–99)
NONHDL: 132.46
Triglycerides: 91 mg/dL (ref 0.0–149.0)
VLDL: 18.2 mg/dL (ref 0.0–40.0)

## 2017-08-16 LAB — BASIC METABOLIC PANEL
BUN: 14 mg/dL (ref 6–23)
CALCIUM: 9.3 mg/dL (ref 8.4–10.5)
CO2: 31 meq/L (ref 19–32)
CREATININE: 0.83 mg/dL (ref 0.40–1.20)
Chloride: 102 mEq/L (ref 96–112)
GFR: 71.64 mL/min (ref >60.00)
Glucose, Bld: 103 mg/dL — ABNORMAL HIGH (ref 70–99)
Potassium: 4.2 mEq/L (ref 3.5–5.1)
Sodium: 141 mEq/L (ref 135–145)

## 2017-08-16 LAB — HEMOGLOBIN A1C: HEMOGLOBIN A1C: 6.2 % (ref 4.6–6.5)

## 2017-08-31 ENCOUNTER — Encounter: Payer: Self-pay | Admitting: Internal Medicine

## 2017-08-31 ENCOUNTER — Ambulatory Visit (INDEPENDENT_AMBULATORY_CARE_PROVIDER_SITE_OTHER): Payer: PPO | Admitting: Internal Medicine

## 2017-08-31 ENCOUNTER — Ambulatory Visit: Payer: PPO | Admitting: Internal Medicine

## 2017-08-31 VITALS — BP 126/66 | HR 65 | Temp 97.5°F | Resp 16 | Ht 65.0 in | Wt 125.0 lb

## 2017-08-31 DIAGNOSIS — F329 Major depressive disorder, single episode, unspecified: Secondary | ICD-10-CM | POA: Diagnosis not present

## 2017-08-31 DIAGNOSIS — R400 Somnolence: Secondary | ICD-10-CM | POA: Diagnosis not present

## 2017-08-31 DIAGNOSIS — R5383 Other fatigue: Secondary | ICD-10-CM

## 2017-08-31 DIAGNOSIS — F419 Anxiety disorder, unspecified: Secondary | ICD-10-CM | POA: Diagnosis not present

## 2017-08-31 DIAGNOSIS — G4700 Insomnia, unspecified: Secondary | ICD-10-CM | POA: Diagnosis not present

## 2017-08-31 NOTE — Progress Notes (Signed)
Subjective:    Patient ID: Mandy Durham, female    DOB: 10/19/1945, 72 y.o.   MRN: [id number]  DOS:  08/31/2017 Type of visit - description : f/u Interval history: Follow-up from previous visit. Depression: Decided not to take Lexapro, feeling well emotionally. Insomnia: Was switched to Ativan, never tried it, currently taking melatonin and sleeps well. Reports that her main concern today is that she feels a sleepy throughout the day.  She falls asleep if she is not active. Denies any falls. Still able to do all her ADLs without problems. Also complains of fatigue, not a new issue.   Review of Systems Denies chest pain, palpitations, lower extremity edema or shortness of breath.  Mild DOE.  No orthopnea. Ambulatory BPs are normal per patient.  Past Medical History:  Diagnosis Date  . Anxiety state, unspecified   . Hyperlipidemia   . Hypertension   . Insomnia   . LBBB (left bundle branch block)    LHC in 2002 showed normal coronaries.   . Osteoarthrosis, unspecified whether generalized or localized, unspecified site   . Overweight(278.02)   . S/P placement of cardiac pacemaker    a. 02/21/16: Medtronic Advisa DR MRI SureScan model [id number] (serial number [id number])   . Serrated adenoma of colon 2007  . Symptomatic menopausal or female climacteric states   . Syncope and collapse    11/12: Holter 12/12 with rare PACS, HR range 64-140, average 82, no significant arrhythmias. Echo (1/13): EF 50-55%, mild LVH, septal-lateral dyssynchrony c/w LBBB. 3-week event monitor (1/13): No significant arrhythmia.     Past Surgical History:  Procedure Laterality Date  . EP IMPLANTABLE DEVICE N/A 02/21/2016   Procedure: Pacemaker Implant;  Surgeon: Will Meredith Leeds, MD;  Location: [facility];  Service: Cardiovascular;  Laterality: N/A;  . TUBAL LIGATION      Social History   Socioeconomic History  . Marital status: Married    Spouse name: [name]  . Number of children: 2  .  Years of education: Not on file  . Highest education level: Not on file  Occupational History  . Occupation: retired Environmental consultant: [employer]  . Financial resource strain: Not on file  . Food insecurity:    Worry: Not on file    Inability: Not on file  . Transportation needs:    Medical: Not on file    Non-medical: Not on file  Tobacco Use  . Smoking status: Never Smoker  . Smokeless tobacco: Never Used  Substance and Sexual Activity  . Alcohol use: No  . Drug use: No  . Sexual activity: Not on file  Lifestyle  . Physical activity:    Days per week: Not on file    Minutes per session: Not on file  . Stress: Not on file  Relationships  . Social connections:    Talks on phone: Not on file    Gets together: Not on file    Attends religious service: Not on file    Active member of club or organization: Not on file    Attends meetings of clubs or organizations: Not on file    Relationship status: Not on file  . Intimate partner violence:    Fear of current or ex partner: Not on file    Emotionally abused: Not on file    Physically abused: Not on file    Forced sexual activity: Not on file  Other Topics  Concern  . Not on file  Social History Narrative   HSG.  Married '65.  2 daughters, '71, '77; 4 grandchildren            Allergies as of 08/31/2017   No Known Allergies     Medication List        Accurate as of 08/31/17 11:59 PM. Always use your most recent med list.          acetaminophen 500 MG tablet Commonly known as:  TYLENOL Take 500 mg by mouth every 6 (six) hours as needed.   azelastine 0.1 % nasal spray Commonly known as:  ASTELIN Place 2 sprays into both nostrils at bedtime as needed for rhinitis. Use in each nostril as directed   carbidopa-levodopa 25-100 MG tablet Commonly known as:  SINEMET IR Take 1 tablet by mouth 3 (three) times daily.   ezetimibe 10 MG tablet Commonly known as:   ZETIA Take 1 tablet (10 mg total) by mouth daily.   ipratropium 0.06 % nasal spray Commonly known as:  ATROVENT Place 1-3 sprays into both nostrils 3 (three) times daily.   metoprolol tartrate 25 MG tablet Commonly known as:  LOPRESSOR Take 1 tablet (25 mg total) by mouth 2 (two) times daily.   promethazine 12.5 MG tablet Commonly known as:  PHENERGAN Take 1-2 tablets (12.5-25 mg total) by mouth every 8 (eight) hours as needed for nausea or vomiting.   traMADol 50 MG tablet Commonly known as:  ULTRAM Take 1 tablet (50 mg total) by mouth every 12 (twelve) hours as needed for severe pain.          Objective:   Physical Exam BP 126/66 (BP Location: Left Arm, Patient Position: Sitting, Cuff Size: Small)   Pulse 65   Temp (!) 97.5 F (36.4 C) (Oral)   Resp 16   Ht 5\' 5"  (1.651 m)   Wt 125 lb (56.7 kg)   SpO2 97%   BMI 20.80 kg/m  General:   Well developed, NAD, see BMI.  HEENT:  Normocephalic . Face symmetric, atraumatic Lungs:  CTA B Normal respiratory effort, no intercostal retractions, no accessory muscle use. Heart: RRR,  no murmur.  No pretibial edema bilaterally  Skin: Not pale. Not jaundice Neurologic:  alert & oriented X3.  + Word finding difficulty at baseline.  Gait   unassisted, at baseline Psych--  Cognition and judgment appear intact.  Cooperative with normal attention span and concentration.  Behavior appropriate. No depression.,  Less apprehensive than before     Assessment & Plan:   Assessment Prediabetes  HTN Hyperlipidemia, statin intolerant Anxiety, insomnia-- xanax, UDS low risk 07-2015 Menopausal; DEXA 2012 -1.5 @ gyn Recurrent Syncope -- extensive cards eval before, DX with autonomic insufficiency ~ 02-2015 --on a  abd binder , pyridostigmine, rx midodrine 06-2015 per cards  --symptomatic bradycardia 2:1-3:1 block, admitted 02-21-2016-- pacemaker placed-- no MRI x 6 weeks  --LBBB Traumatic SAH (after a syncope),TBI--- admitted 01-2015  , d/c to a rehab unit temporarily -- was rx seroquel (aparently for ICU delirium) -- d/c 07-2015 --rx topamax when at rehab for post [facility]  HAs -- had issues w/b/b incontinence, better as of 07-2015 Tremors LUE, onset 9 -2017  PLAN  Anxiety: See last visit, was recommended to go back on Lexapro, decided not to do it.  Feeling well emotionally. Insomnia: See last visit, Xanax was switched to Ativan due to hallucinations.  She did not try Ativan, taking melatonin and sleeps well. Sleepy: Feels sleepy  throughout the day despite sleeping well at night.  She does snore sometimes but I really can't pinpoint to a single reason for her to feel sleepy.  No anxiety, depression.  On Phenergan and tramadol but takes that rarely. Based on that, I think "feeling sleepy" is a multifactorial problem. Fortunately, she denies any falls or accidents.  Recd observation for now, at some point  may ask neuro to see the patient regards this, provigil? Fatigue: Continue with lack of energy, mild DOE.  No other cardiopulmonary symptoms.  Recent labs are normal, she is able to do all her ADLs without problems.  Recommend observation Overall, I reassured her, I think she is doing well despite  her symptoms.  Encouraged to call me if she gets worse. RTC 4 months  Today, I spent more than   25 min with the patient: >50% of the time counseling regards insomnia, feeling a sleepy, chronic fatigue, trying to reassure her that after reviewing the chart and examining her, I do not think she has a major problem at this point and her symptoms are multifactorial.  Multiple questions answered to the best of my ability.

## 2017-08-31 NOTE — Patient Instructions (Signed)
Please come back in 4 months, make an appointment

## 2017-08-31 NOTE — Progress Notes (Signed)
Pre visit review using our clinic review tool, if applicable. No additional management support is needed unless otherwise documented below in the visit note. 

## 2017-09-01 NOTE — Assessment & Plan Note (Signed)
Anxiety: See last visit, was recommended to go back on Lexapro, decided not to do it.  Feeling well emotionally. Insomnia: See last visit, Xanax was switched to Ativan due to hallucinations.  She did not try Ativan, taking melatonin and sleeps well. Sleepy: Feels sleepy throughout the day despite sleeping well at night.  She does snore sometimes but I really can't pinpoint to a single reason for her to feel sleepy.  No anxiety, depression.  On Phenergan and tramadol but takes that rarely. Based on that, I think "feeling sleepy" is a multifactorial problem. Fortunately, she denies any falls or accidents.  Recd observation for now, at some point  may ask neuro to see the patient regards this, provigil? Fatigue: Continue with lack of energy, mild DOE.  No other cardiopulmonary symptoms.  Recent labs are normal, she is able to do all her ADLs without problems.  Recommend observation Overall, I reassured her, I think she is doing well despite  her symptoms.  Encouraged to call me if she gets worse. RTC 4 months

## 2017-09-08 ENCOUNTER — Encounter: Payer: Self-pay | Admitting: Gastroenterology

## 2017-09-08 ENCOUNTER — Ambulatory Visit: Payer: PPO | Admitting: Gastroenterology

## 2017-09-08 VITALS — BP 124/70 | HR 66 | Ht 65.0 in | Wt 122.6 lb

## 2017-09-08 DIAGNOSIS — K5901 Slow transit constipation: Secondary | ICD-10-CM | POA: Diagnosis not present

## 2017-09-08 DIAGNOSIS — K30 Functional dyspepsia: Secondary | ICD-10-CM | POA: Insufficient documentation

## 2017-09-08 DIAGNOSIS — Z86010 Personal history of colonic polyps: Secondary | ICD-10-CM | POA: Diagnosis not present

## 2017-09-08 NOTE — Progress Notes (Signed)
    History of Present Illness: This is a 72 year old female here for mild constipation and to discuss colonoscopy. Evaluation by Dr. Newman Pies for abdominal pain, weight loss, nausea at [facility] reviewed including normal GES, normal CCK HIDA, normal EGD, normal EUS with diagnoses of functional dyspepsia, nausea, epigastric pain.  Constipation occurs intermittently and she takes MiraLAX as needed however it takes a couple days for an effect.  Current Medications, Allergies, Past Medical History, Past Surgical History, Family History and Social History were reviewed in [employer] record.  Physical Exam: General: Well developed, well nourished, no acute distress Head: Normocephalic and atraumatic Eyes:  sclerae anicteric, EOMI Ears: Normal auditory acuity Mouth: No deformity or lesions Lungs: Clear throughout to auscultation Heart: Regular rate and rhythm; no murmurs, rubs or bruits Abdomen: Soft, non tender and non distended. No masses, hepatosplenomegaly or hernias noted. Normal Bowel sounds Rectal: Deferred to colonoscopy Musculoskeletal: Symmetrical with no gross deformities  Pulses:  Normal pulses noted Extremities: No clubbing, cyanosis, edema or deformities noted Neurological: Alert oriented x 4, grossly nonfocal Psychological:  Alert and cooperative. Normal mood and affect   Assessment and Recommendations:  1. Personal history of serrated adenoma.  She is overdue for surveillance colonoscopy.  Schedule colonoscopy. The risks (including bleeding, perforation, infection, missed lesions, medication reactions and possible hospitalization or surgery if complications occur), benefits, and alternatives to colonoscopy with possible biopsy and possible polypectomy were discussed with the patient and they consent to proceed.   2. Constipation.  Change MiraLAX to 1/2 to 1 daily scoop titrate for desired effect.  3. Nausea, suspected neurologic etiology.  TBI and h/o vertigo  secondary to TBI.   4. Functional dyspepsia.  Extensive evaluation here and at [facility].

## 2017-09-08 NOTE — Patient Instructions (Addendum)
You have been scheduled for your Colonoscopy on 11/16/17 at 11:00am. We have also scheduled you for your nurse visit to get your instructions for Colonoscopy on 11/09/17 at 10:30am. Please bring a family member with you to your nurse visit to help understand your instructions more easily.   Please take your Miralax daily and not as needed.   Thank you for choosing me and [facility].  Pricilla Riffle. Dagoberto Ligas., MD., [facility]

## 2017-09-09 ENCOUNTER — Ambulatory Visit (INDEPENDENT_AMBULATORY_CARE_PROVIDER_SITE_OTHER): Payer: PPO | Admitting: *Deleted

## 2017-09-09 DIAGNOSIS — I441 Atrioventricular block, second degree: Secondary | ICD-10-CM | POA: Diagnosis not present

## 2017-09-10 LAB — CUP PACEART REMOTE DEVICE CHECK
Battery Remaining Longevity: 102 mo
Battery Voltage: 3.02 V
Brady Statistic AP VP Percent: 0.01 %
Brady Statistic AS VS Percent: 96.86 %
Brady Statistic RA Percent Paced: 3.11 %
Implantable Lead Implant Date: 20171208
Implantable Lead Implant Date: 20171208
Implantable Lead Location: 753860
Implantable Lead Model: 5076
Implantable Pulse Generator Implant Date: 20171208
Lead Channel Impedance Value: 456 Ohm
Lead Channel Impedance Value: 456 Ohm
Lead Channel Impedance Value: 589 Ohm
Lead Channel Pacing Threshold Amplitude: 0.75 V
Lead Channel Pacing Threshold Pulse Width: 0.4 ms
Lead Channel Pacing Threshold Pulse Width: 0.4 ms
Lead Channel Sensing Intrinsic Amplitude: 2.875 mV
Lead Channel Setting Pacing Amplitude: 2 V
Lead Channel Setting Sensing Sensitivity: 4 mV
MDC IDC LEAD LOCATION: 753859
MDC IDC MSMT LEADCHNL RA SENSING INTR AMPL: 2.875 mV
MDC IDC MSMT LEADCHNL RV IMPEDANCE VALUE: 513 Ohm
MDC IDC MSMT LEADCHNL RV PACING THRESHOLD AMPLITUDE: 0.5 V
MDC IDC MSMT LEADCHNL RV SENSING INTR AMPL: 18.625 mV
MDC IDC MSMT LEADCHNL RV SENSING INTR AMPL: 18.625 mV
MDC IDC SESS DTM: 20190627202520
MDC IDC SET LEADCHNL RV PACING AMPLITUDE: 2.5 V
MDC IDC SET LEADCHNL RV PACING PULSEWIDTH: 0.4 ms
MDC IDC STAT BRADY AP VS PERCENT: 3.1 %
MDC IDC STAT BRADY AS VP PERCENT: 0.03 %
MDC IDC STAT BRADY RV PERCENT PACED: 0.04 %

## 2017-09-10 NOTE — Progress Notes (Signed)
Remote pacemaker transmission.   

## 2017-10-25 ENCOUNTER — Other Ambulatory Visit: Payer: Self-pay | Admitting: Internal Medicine

## 2017-11-09 ENCOUNTER — Ambulatory Visit (AMBULATORY_SURGERY_CENTER): Payer: Self-pay | Admitting: *Deleted

## 2017-11-09 VITALS — Ht 65.0 in | Wt 123.0 lb

## 2017-11-09 DIAGNOSIS — Z86010 Personal history of colonic polyps: Secondary | ICD-10-CM

## 2017-11-09 MED ORDER — NA SULFATE-K SULFATE-MG SULF 17.5-3.13-1.6 GM/177ML PO SOLN: ORAL | 0 refills | Status: DC

## 2017-11-09 NOTE — Progress Notes (Signed)
Patient denies any allergies to eggs or soy. Patient denies any problems with anesthesia/sedation. Patient denies any oxygen use at home. Patient denies taking any diet/weight loss medications or blood thinners. EMMI education offered, pt declined, no email per pt. Suprep sample kit given to pt. Pharmacy called, Suprep cost $90.

## 2017-11-16 ENCOUNTER — Encounter: Payer: Self-pay | Admitting: Gastroenterology

## 2017-11-16 ENCOUNTER — Ambulatory Visit (AMBULATORY_SURGERY_CENTER): Payer: PPO | Admitting: Gastroenterology

## 2017-11-16 VITALS — BP 121/66 | HR 68 | Temp 98.0°F | Resp 14 | Ht 65.0 in | Wt 123.0 lb

## 2017-11-16 DIAGNOSIS — K635 Polyp of colon: Secondary | ICD-10-CM | POA: Diagnosis not present

## 2017-11-16 DIAGNOSIS — D125 Benign neoplasm of sigmoid colon: Secondary | ICD-10-CM

## 2017-11-16 DIAGNOSIS — I10 Essential (primary) hypertension: Secondary | ICD-10-CM | POA: Diagnosis not present

## 2017-11-16 DIAGNOSIS — Z86010 Personal history of colonic polyps: Secondary | ICD-10-CM | POA: Diagnosis not present

## 2017-11-16 DIAGNOSIS — D123 Benign neoplasm of transverse colon: Secondary | ICD-10-CM

## 2017-11-16 MED ORDER — SODIUM CHLORIDE 0.9 % IV SOLN: 500.0000 mL | Freq: Once | INTRAVENOUS | Status: DC

## 2017-11-16 NOTE — Progress Notes (Signed)
Called to room to assist during endoscopic procedure.  Patient ID and intended procedure confirmed with present staff. Received instructions for my participation in the procedure from the performing physician.  

## 2017-11-16 NOTE — Progress Notes (Signed)
Report given to PACU, vss 

## 2017-11-16 NOTE — Op Note (Signed)
[place] Patient Name: Annaya Denn Procedure Date: 11/16/2017 10:30 AM MRN: [id number] Endoscopist: Ladene Artist , MD Age: 72 Referring MD:  Date of Birth: February 27, 1946 Gender: Female Account #: [id number] Procedure:                Colonoscopy Indications:              High risk colon cancer surveillance: Personal                            history of traditional serrated adenoma of the colon Medicines:                Monitored Anesthesia Care Procedure:                Pre-Anesthesia Assessment:                           - Prior to the procedure, a History and Physical                            was performed, and patient medications and                            allergies were reviewed. The patient's tolerance of                            previous anesthesia was also reviewed. The risks                            and benefits of the procedure and the sedation                            options and risks were discussed with the patient.                            All questions were answered, and informed consent                            was obtained. Prior Anticoagulants: The patient has                            taken no previous anticoagulant or antiplatelet                            agents. ASA Grade Assessment: II - A patient with                            mild systemic disease. After reviewing the risks                            and benefits, the patient was deemed in                            satisfactory condition to undergo the procedure.  After obtaining informed consent, the colonoscope                            was passed under direct vision. Throughout the                            procedure, the patient's blood pressure, pulse, and                            oxygen saturations were monitored continuously. The                            Colonoscope was introduced through the anus and                            advanced to  the the cecum, identified by                            appendiceal orifice and ileocecal valve. The                            ileocecal valve, appendiceal orifice, and rectum                            were photographed. The quality of the bowel                            preparation was excellent. The colonoscopy was                            performed without difficulty. The patient tolerated                            the procedure well. Scope In: 10:35:49 AM Scope Out: 10:51:18 AM Scope Withdrawal Time: 0 hours 11 minutes 9 seconds  Total Procedure Duration: 0 hours 15 minutes 29 seconds  Findings:                 The perianal and digital rectal examinations were                            normal.                           Three sessile polyps were found in the sigmoid                            colon (2) and transverse colon (1). The polyps were                            7 to 8 mm in size. These polyps were removed with a                            cold snare. Resection and retrieval were complete.  A few medium-mouthed diverticula were found in the                            left colon. There was no evidence of diverticular                            bleeding.                           The exam was otherwise without abnormality on                            direct and retroflexion views. Complications:            No immediate complications. Estimated blood loss:                            None. Estimated Blood Loss:     Estimated blood loss: none. Impression:               - Three 7 to 8 mm polyps in the sigmoid colon and                            in the transverse colon, removed with a cold snare.                            Resected and retrieved.                           - Mild diverticulosis in the left colon. There was                            no evidence of diverticular bleeding.                           - The examination was otherwise normal  on direct                            and retroflexion views. Recommendation:           - Repeat colonoscopy in 3 - 5 years for                            surveillance pending pathology review.                           - Patient has a contact number available for                            emergencies. The signs and symptoms of potential                            delayed complications were discussed with the                            patient. Return to normal  activities tomorrow.                            Written discharge instructions were provided to the                            patient.                           - Resume previous diet.                           - Continue present medications.                           - Await pathology results. Ladene Artist, MD 11/16/2017 10:54:15 AM This report has been signed electronically.

## 2017-11-16 NOTE — Patient Instructions (Signed)
YOU HAD AN ENDOSCOPIC PROCEDURE TODAY AT THE [facility]:   Refer to the procedure report that was given to you for any specific questions about what was found during the examination.  If the procedure report does not answer your questions, please call your gastroenterologist to clarify.  If you requested that your care partner not be given the details of your procedure findings, then the procedure report has been included in a sealed envelope for you to review at your convenience later.  YOU SHOULD EXPECT: Some feelings of bloating in the abdomen. Passage of more gas than usual.  Walking can help get rid of the air that was put into your GI tract during the procedure and reduce the bloating. If you had a lower endoscopy (such as a colonoscopy or flexible sigmoidoscopy) you may notice spotting of blood in your stool or on the toilet paper. If you underwent a bowel prep for your procedure, you may not have a normal bowel movement for a few days.  Please Note:  You might notice some irritation and congestion in your nose or some drainage.  This is from the oxygen used during your procedure.  There is no need for concern and it should clear up in a day or so.  SYMPTOMS TO REPORT IMMEDIATELY:   Following lower endoscopy (colonoscopy or flexible sigmoidoscopy):  Excessive amounts of blood in the stool  Significant tenderness or worsening of abdominal pains  Swelling of the abdomen that is new, acute  Fever of 100F or higher   For urgent or emergent issues, a gastroenterologist can be reached at any hour by calling [phone].   DIET:  We do recommend a small meal at first, but then you may proceed to your regular diet.  Drink plenty of fluids but you should avoid alcoholic beverages for 24 hours. Try to increase the fiber in your diet, and drink plenty of water.  ACTIVITY:  You should plan to take it easy for the rest of today and you should NOT DRIVE or use heavy machinery until  tomorrow (because of the sedation medicines used during the test).    FOLLOW UP: Our staff will call the number listed on your records the next business day following your procedure to check on you and address any questions or concerns that you may have regarding the information given to you following your procedure. If we do not reach you, we will leave a message.  However, if you are feeling well and you are not experiencing any problems, there is no need to return our call.  We will assume that you have returned to your regular daily activities without incident.  If any biopsies were taken you will be contacted by phone or by letter within the next 1-3 weeks.  Please call us at [phone] if you have not heard about the biopsies in 3 weeks.    SIGNATURES/CONFIDENTIALITY: You and/or your care partner have signed paperwork which will be entered into your electronic medical record.  These signatures attest to the fact that that the information above on your After Visit Summary has been reviewed and is understood.  Full responsibility of the confidentiality of this discharge information lies with you and/or your care-partner.  Read all handouts given to you by your recovery room nurse. 

## 2017-11-17 ENCOUNTER — Telehealth: Payer: Self-pay

## 2017-11-17 NOTE — Telephone Encounter (Signed)
  Follow up Call-  Call back number 11/16/2017 10/06/2016  Post procedure Call Back phone  # [phone]  Permission to leave phone message Yes Yes  Some recent data might be hidden     Patient questions:  Do you have a fever, pain , or abdominal swelling? No. Pain Score  0 *  Have you tolerated food without any problems? Yes.    Have you been able to return to your normal activities? Yes.    Do you have any questions about your discharge instructions: Diet   No. Medications  No. Follow up visit  No.  Do you have questions or concerns about your Care? No.  Actions: * If pain score is 4 or above: No action needed, pain <4.

## 2017-11-24 ENCOUNTER — Encounter: Payer: Self-pay | Admitting: Gastroenterology

## 2017-12-07 ENCOUNTER — Encounter: Payer: Self-pay | Admitting: Cardiology

## 2017-12-07 ENCOUNTER — Ambulatory Visit: Payer: PPO | Admitting: Cardiology

## 2017-12-07 VITALS — BP 126/66 | HR 66 | Ht 65.0 in | Wt 124.0 lb

## 2017-12-07 DIAGNOSIS — R011 Cardiac murmur, unspecified: Secondary | ICD-10-CM

## 2017-12-07 DIAGNOSIS — I10 Essential (primary) hypertension: Secondary | ICD-10-CM | POA: Diagnosis not present

## 2017-12-07 DIAGNOSIS — I441 Atrioventricular block, second degree: Secondary | ICD-10-CM

## 2017-12-07 DIAGNOSIS — R079 Chest pain, unspecified: Secondary | ICD-10-CM | POA: Diagnosis not present

## 2017-12-07 DIAGNOSIS — E785 Hyperlipidemia, unspecified: Secondary | ICD-10-CM

## 2017-12-07 DIAGNOSIS — I209 Angina pectoris, unspecified: Secondary | ICD-10-CM

## 2017-12-07 MED ORDER — METOPROLOL TARTRATE 25 MG PO TABS: 25.0000 mg | ORAL_TABLET | Freq: Two times a day (BID) | ORAL | 3 refills | Status: DC

## 2017-12-07 NOTE — Progress Notes (Signed)
Electrophysiology Office Note   Date:  12/07/2017   ID:  Mandy Durham, Mandy Durham 1946-01-12, MRN [id number]  PCP:  Colon Branch, MD  Cardiologist:  Aundra Dubin Primary Electrophysiologist:  Keith Felten Meredith Leeds, MD    No chief complaint on file.    History of Present Illness: Mandy Durham is a 72 y.o. female who presents today for electrophysiology evaluation.   Presented to the hospital in 3:1 AV block. Metoprolol washed out with continued block and the patient received a Medtronic dual chamber pacemaker.  Today, denies symptoms of palpitations, orthopnea, PND, lower extremity edema, claudication, dizziness, presyncope, syncope, bleeding, or neurologic sequela. The patient is tolerating medications without difficulties.  Unfortunately, she has been getting chest pain and shortness of breath.  Her chest pain occurs mainly when she exerts herself and goes away when she rests.  Her pain is associated at times with shortness of breath.  Her pain lasts for up to a minute and goes away with rest.  She otherwise feels well.   Past Medical History:  Diagnosis Date  . Anxiety state, unspecified   . Hyperlipidemia   . Hypertension   . Insomnia   . LBBB (left bundle branch block)    LHC in 2002 showed normal coronaries.   . Osteoarthrosis, unspecified whether generalized or localized, unspecified site   . Overweight(278.02)   . S/P placement of cardiac pacemaker    a. 02/21/16: Medtronic Advisa DR MRI SureScan model [id number] (serial number [id number])   . Serrated adenoma of colon 2007  . Symptomatic menopausal or female climacteric states   . Syncope and collapse    11/12: Holter 12/12 with rare PACS, HR range 64-140, average 82, no significant arrhythmias. Echo (1/13): EF 50-55%, mild LVH, septal-lateral dyssynchrony c/w LBBB. 3-week event monitor (1/13): No significant arrhythmia.    Past Surgical History:  Procedure Laterality Date  . COLONOSCOPY  2007  . EP IMPLANTABLE DEVICE N/A 02/21/2016     Procedure: Pacemaker Implant;  Surgeon: Kiely Cousar Meredith Leeds, MD;  Location: [facility];  Service: Cardiovascular;  Laterality: N/A;  . POLYPECTOMY    . TUBAL LIGATION       Current Outpatient Medications  Medication Sig Dispense Refill  . acetaminophen (TYLENOL) 500 MG tablet Take 500 mg by mouth every 6 (six) hours as needed.    . carbidopa-levodopa (SINEMET IR) 25-100 MG tablet Take 1 tablet by mouth 3 (three) times daily. 270 tablet 4  . Cholecalciferol (VITAMIN D PO) Take 1 tablet by mouth daily. Vitamin D    . ezetimibe (ZETIA) 10 MG tablet Take 1 tablet (10 mg total) by mouth daily. 90 tablet 1  . metoprolol tartrate (LOPRESSOR) 25 MG tablet Take 1 tablet (25 mg total) by mouth 2 (two) times daily. Please hold until patient request refill 180 tablet 3  . traMADol (ULTRAM) 50 MG tablet Take 1 tablet (50 mg total) by mouth every 12 (twelve) hours as needed for severe pain. 20 tablet 0   No current facility-administered medications for this visit.     Allergies:   Patient has no known allergies.   Social History:  The patient  reports that she has never smoked. She has never used smokeless tobacco. She reports that she does not drink alcohol or use drugs.   Family History:  The patient's family history includes Coronary artery disease in her mother; Dementia in her mother; Diabetes in her mother; Heart failure in her mother.    ROS:  Please see the history of present illness.   Otherwise, review of systems is positive for chest pressure, shortness of breath.   All other systems are reviewed and negative.   PHYSICAL EXAM: VS:  BP 126/66   Pulse 66   Ht 5\' 5"  (1.651 m)   Wt 124 lb (56.2 kg)   SpO2 98%   BMI 20.63 kg/m  , BMI Body mass index is 20.63 kg/m. GEN: Well nourished, well developed, in no acute distress  HEENT: normal  Neck: no JVD, carotid bruits, or masses Cardiac: RRR; no murmurs, rubs, or gallops,no edema  Respiratory:  clear to auscultation  bilaterally, normal work of breathing GI: soft, nontender, nondistended, + BS MS: no deformity or atrophy  Skin: warm and dry, device site well healed Neuro:  Strength and sensation are intact Psych: euthymic mood, full affect  EKG:  EKG is not ordered today. Personal review of the ekg ordered 06/15/17 shows sinus rhythm, ventricular paced  Personal review of the device interrogation today. Results in [facility]: 08/16/2017: BUN 14; Creatinine, Ser 0.83; Hemoglobin 13.9; Platelets 297.0; Potassium 4.2; Sodium 141; TSH 2.13    Lipid Panel     Component Value Date/Time   CHOL 237 (H) 08/16/2017 0848   TRIG 91.0 08/16/2017 0848   HDL 104.40 08/16/2017 0848   CHOLHDL 2 08/16/2017 0848   VLDL 18.2 08/16/2017 0848   LDLCALC 114 (H) 08/16/2017 0848   LDLDIRECT 106.9 11/03/2012 0913     Wt Readings from Last 3 Encounters:  12/07/17 124 lb (56.2 kg)  11/16/17 123 lb (55.8 kg)  11/09/17 123 lb (55.8 kg)      Other studies Reviewed: Additional studies/ records that were reviewed today include: TTE 07/17/15  Review of the above records today demonstrates:  - Left ventricle: The cavity size was normal. There was severe   focal basal hypertrophy of the septum. Systolic function was   normal. The estimated ejection fraction was in the range of 50%   to 55%. Anteroseptal hypokinesis. Doppler parameters are   consistent with abnormal left ventricular relaxation (grade 1   diastolic dysfunction). The E/e&' ratio is between 8-15,   suggesting indeterminate LV filling pressure. - Mitral valve: Mildly thickened leaflets . There was trivial   regurgitation. - Left atrium: The atrium was normal in size. - Tricuspid valve: There was mild regurgitation. - Pulmonary arteries: PA peak pressure: 32 mm Hg (S). - Inferior vena cava: The vessel was normal in size. The   respirophasic diameter changes were in the normal range (>= 50%),   consistent with normal central venous  pressure.   ASSESSMENT AND PLAN:  1.  3:1 AV block: That is post Medtronic dual-chamber pacemaker 02/21/2016.  Device functioning appropriately.  No changes.    2. Hypertension: Currently well controlled.  No changes.  3. Hyperlipidemia: Continue Zetia  4.  Stable angina: Currently having chest pain that sounds typical for stable angina.  She is also having associated shortness of breath.  Due to that, we Solita Macadam order a coronary CT.  I did discuss with her the possibility of left heart catheterization in the future.  Current medicines are reviewed at length with the patient today.   The patient does not have concerns regarding her medicines.  The following changes were made today: None  Labs/ tests ordered today include:  Orders Placed This Encounter  Procedures  . CT CORONARY MORPH W/CTA COR W/SCORE W/CA W/CM &/OR WO/CM  . CT CORONARY  FRACTIONAL FLOW RESERVE DATA PREP  . CT CORONARY FRACTIONAL FLOW RESERVE FLUID ANALYSIS     Disposition:   FU with Matilynn Dacey 12 months  Signed, Kacy Conely Meredith Leeds, MD  12/07/2017 4:55 PM     [address] [place] [address] [phone] (office) [phone] (fax)

## 2017-12-07 NOTE — Patient Instructions (Addendum)
Medication Instructions:  Your physician recommends that you continue on your current medications as directed. Please refer to the Current Medication list given to you today.  *If you need a refill on your cardiac medications before your next appointment, please call your pharmacy*  Labwork: None ordered  Testing/Procedures: Your physician has requested that you have cardiac CT. Cardiac computed tomography (CT) is a painless test that uses an x-ray machine to take clear, detailed pictures of your heart. For further information please visit [email or web address]. Please follow instructions below located under the special instructions area.  The office will call you to arrange this testing.    Follow-Up: Remote monitoring is used to monitor your Pacemaker or ICD from home. This monitoring reduces the number of office visits required to check your device to one time per year. It allows [place] to keep an eye on the functioning of your device to ensure it is working properly. You are scheduled for a device check from home on 12/09/2017. You may send your transmission at any time that day. If you have a wireless device, the transmission will be sent automatically. After your physician reviews your transmission, you will receive a postcard with your next transmission date.  Your physician wants you to follow-up in: 1 year with Dr. Curt Bears.  You will receive a reminder letter in the mail two months in advance. If you don't receive a letter, please call our office to schedule the follow-up appointment.  Thank you for choosing [facility]!!   Trinidad Curet, RN [phone]  Any Other Special Instructions Will Be Listed Below (If Applicable).   CT INSTRUCTIONS Please arrive at the [facility] main entrance of [facility] at ______ AM (30-45 minutes prior to test start time)  [facility] [place], [place] [address] [phone]  Proceed to the [facility]  Radiology Department (First Floor).  Please follow these instructions carefully (unless otherwise directed):  On the Night Before the Test: . Be sure to Drink plenty of water. . Do not consume any caffeinated/decaffeinated beverages or chocolate 12 hours prior to your test. . Do not take any antihistamines 12 hours prior to your test. . If you take Metformin do not take 24 hours prior to test. . If the patient has contrast allergy: ? Patient will need a prescription for Prednisone and very clear instructions (as follows): 1. Prednisone 50 mg - take 13 hours prior to test 2. Take another Prednisone 50 mg 7 hours prior to test 3. Take another Prednisone 50 mg 1 hour prior to test 4. Take Benadryl 50 mg 1 hour prior to test . Patient must complete all four doses of above prophylactic medications. . Patient will need a ride after test due to Benadryl.  On the Day of the Test: . Drink plenty of water. Do not drink any water within one hour of the test. . Do not eat any food 4 hours prior to the test. . You may take your regular medications prior to the test. . MAKE SURE TO TAKE YOUR LOPRESSOR THE DAY OF THIS TESTING  o HOLD Furosemide morning of the test.  After the Test: . Drink plenty of water. . After receiving IV contrast, you may experience a mild flushed feeling. This is normal. . On occasion, you may experience a mild rash up to 24 hours after the test. This is not dangerous. If this occurs, you can take Benadryl 25 mg and increase your fluid intake. Marland Kitchen  If you experience trouble breathing, this can be serious. If it is severe call 911 IMMEDIATELY. If it is mild, please call our office.

## 2017-12-08 ENCOUNTER — Telehealth: Payer: Self-pay | Admitting: Gastroenterology

## 2017-12-08 NOTE — Telephone Encounter (Signed)
Patient notified she can use Miralax for her daily constipation. She will call back for any additional questions or concerns.

## 2017-12-08 NOTE — Telephone Encounter (Signed)
Patient states that she is still having trouble going to the bathroom and wants some advice until appt on 10.29.19.

## 2017-12-09 ENCOUNTER — Telehealth: Payer: Self-pay

## 2017-12-09 ENCOUNTER — Ambulatory Visit (INDEPENDENT_AMBULATORY_CARE_PROVIDER_SITE_OTHER): Payer: PPO | Admitting: *Deleted

## 2017-12-09 DIAGNOSIS — I441 Atrioventricular block, second degree: Secondary | ICD-10-CM | POA: Diagnosis not present

## 2017-12-09 NOTE — Telephone Encounter (Signed)
Spoke with pt and reminded pt of remote transmission that is due today. Pt verbalized understanding.   

## 2017-12-10 ENCOUNTER — Encounter: Payer: Self-pay | Admitting: Cardiology

## 2017-12-10 NOTE — Progress Notes (Signed)
Remote pacemaker transmission.   

## 2017-12-22 LAB — CUP PACEART INCLINIC DEVICE CHECK
Date Time Interrogation Session: 20191009124112
Implantable Lead Implant Date: 20171208
Implantable Lead Implant Date: 20171208
Implantable Lead Location: 753860
Implantable Lead Model: 5076
MDC IDC LEAD LOCATION: 753859
MDC IDC PG IMPLANT DT: 20171208

## 2017-12-23 ENCOUNTER — Other Ambulatory Visit: Payer: Self-pay | Admitting: *Deleted

## 2017-12-23 DIAGNOSIS — Z01812 Encounter for preprocedural laboratory examination: Secondary | ICD-10-CM

## 2017-12-23 DIAGNOSIS — R079 Chest pain, unspecified: Secondary | ICD-10-CM

## 2017-12-31 ENCOUNTER — Encounter: Payer: Self-pay | Admitting: Internal Medicine

## 2017-12-31 ENCOUNTER — Ambulatory Visit (INDEPENDENT_AMBULATORY_CARE_PROVIDER_SITE_OTHER): Payer: PPO | Admitting: Internal Medicine

## 2017-12-31 VITALS — BP 122/74 | HR 67 | Temp 98.0°F | Resp 16 | Ht 65.0 in | Wt 123.1 lb

## 2017-12-31 DIAGNOSIS — R202 Paresthesia of skin: Secondary | ICD-10-CM

## 2017-12-31 DIAGNOSIS — I10 Essential (primary) hypertension: Secondary | ICD-10-CM | POA: Diagnosis not present

## 2017-12-31 DIAGNOSIS — Z23 Encounter for immunization: Secondary | ICD-10-CM | POA: Diagnosis not present

## 2017-12-31 DIAGNOSIS — Z0000 Encounter for general adult medical examination without abnormal findings: Secondary | ICD-10-CM

## 2017-12-31 DIAGNOSIS — R7303 Prediabetes: Secondary | ICD-10-CM

## 2017-12-31 LAB — B12 AND FOLATE PANEL
Folate: 14 ng/mL (ref >5.9)
Vitamin B-12: 195 pg/mL — ABNORMAL LOW (ref 211–911)

## 2017-12-31 LAB — HEMOGLOBIN A1C: Hgb A1c MFr Bld: 6.2 % (ref 4.6–6.5)

## 2017-12-31 NOTE — Assessment & Plan Note (Addendum)
Flu shot today.   Shingrix discussed, will think about  Last Pap smear 2016, MMG 2017; pt plans to call gyn Last colonoscopy 11/16/2017 Last bone density was 2012, T score -1.5. Plans to discuss w/ gyn

## 2017-12-31 NOTE — Progress Notes (Signed)
Subjective:    Patient ID: Mandy Durham, female    DOB: 1945/11/06, 72 y.o.   MRN: [id number]  DOS:  12/31/2017 Type of visit - description : f/u Interval history: Her concern today is bilateral feet numbness.  That is started few months ago, the bottom of her feet feel "funny", similar feeling at the distal dorsum.  Worse at night.  Review of Systems Still has some GI issues, per gastroenterology. Has stopped taking tramadol for months, still have some leftover. Otherwise reports she is doing okay.  Past Medical History:  Diagnosis Date  . Anxiety state, unspecified   . Hyperlipidemia   . Hypertension   . Insomnia   . LBBB (left bundle branch block)    LHC in 2002 showed normal coronaries.   . Osteoarthrosis, unspecified whether generalized or localized, unspecified site   . Overweight(278.02)   . S/P placement of cardiac pacemaker    a. 02/21/16: Medtronic Advisa DR MRI SureScan model [id number] (serial number [id number])   . Serrated adenoma of colon 2007  . Symptomatic menopausal or female climacteric states   . Syncope and collapse    11/12: Holter 12/12 with rare PACS, HR range 64-140, average 82, no significant arrhythmias. Echo (1/13): EF 50-55%, mild LVH, septal-lateral dyssynchrony c/w LBBB. 3-week event monitor (1/13): No significant arrhythmia.     Past Surgical History:  Procedure Laterality Date  . COLONOSCOPY  2007  . EP IMPLANTABLE DEVICE N/A 02/21/2016   Procedure: Pacemaker Implant;  Surgeon: Will Meredith Leeds, MD;  Location: [facility];  Service: Cardiovascular;  Laterality: N/A;  . POLYPECTOMY    . TUBAL LIGATION      Social History   Socioeconomic History  . Marital status: Married    Spouse name: [name]  . Number of children: 2  . Years of education: Not on file  . Highest education level: Not on file  Occupational History  . Occupation: retired Environmental consultant: [employer]  . Financial  resource strain: Not on file  . Food insecurity:    Worry: Not on file    Inability: Not on file  . Transportation needs:    Medical: Not on file    Non-medical: Not on file  Tobacco Use  . Smoking status: Never Smoker  . Smokeless tobacco: Never Used  Substance and Sexual Activity  . Alcohol use: No  . Drug use: No  . Sexual activity: Not on file  Lifestyle  . Physical activity:    Days per week: Not on file    Minutes per session: Not on file  . Stress: Not on file  Relationships  . Social connections:    Talks on phone: Not on file    Gets together: Not on file    Attends religious service: Not on file    Active member of club or organization: Not on file    Attends meetings of clubs or organizations: Not on file    Relationship status: Not on file  . Intimate partner violence:    Fear of current or ex partner: Not on file    Emotionally abused: Not on file    Physically abused: Not on file    Forced sexual activity: Not on file  Other Topics Concern  . Not on file  Social History Narrative   HSG.  Married '65.  2 daughters, '71, '77; 4 grandchildren  Allergies as of 12/31/2017   No Known Allergies     Medication List        Accurate as of 12/31/17  1:03 PM. Always use your most recent med list.          acetaminophen 500 MG tablet Commonly known as:  TYLENOL Take 500 mg by mouth every 6 (six) hours as needed.   carbidopa-levodopa 25-100 MG tablet Commonly known as:  SINEMET IR Take 1 tablet by mouth 3 (three) times daily.   ezetimibe 10 MG tablet Commonly known as:  ZETIA Take 1 tablet (10 mg total) by mouth daily.   metoprolol tartrate 25 MG tablet Commonly known as:  LOPRESSOR Take 1 tablet (25 mg total) by mouth 2 (two) times daily. Please hold until patient request refill   PEPCID AC 10 MG tablet Generic drug:  famotidine Take 10 mg by mouth daily.   traMADol 50 MG tablet Commonly known as:  ULTRAM Take 1 tablet (50 mg  total) by mouth every 12 (twelve) hours as needed for severe pain.   VITAMIN D PO Take 1 tablet by mouth daily. Vitamin D          Objective:   Physical Exam BP 122/74 (BP Location: Left Arm, Patient Position: Sitting, Cuff Size: Small)   Pulse 67   Temp 98 F (36.7 C) (Oral)   Resp 16   Ht 5\' 5"  (1.651 m)   Wt 123 lb 2 oz (55.8 kg)   SpO2 97%   BMI 20.49 kg/m  General:   Well developed, NAD, see BMI.  HEENT:  Normocephalic . Face symmetric, atraumatic Lungs:  CTA B Normal respiratory effort, no intercostal retractions, no accessory muscle use. Heart: RRR,  no murmur.  No pretibial edema bilaterally Good pedal pulses bilaterally. Skin: Not pale. Not jaundice Neurologic:  alert & oriented X3.  Speech normal, gait appropriate for age and unassisted DTR symmetric. Pinprick examination of her feet completely normal. Psych--  Cognition and judgment appear intact.  Cooperative with normal attention span and concentration.  Behavior appropriate. No anxious or depressed appearing.      Assessment & Plan:   Assessment Prediabetes  HTN Hyperlipidemia, statin intolerant Anxiety, insomnia-- xanax, UDS low risk 07-2015 Menopausal; DEXA 2012 -1.5 @ gyn Recurrent Syncope --3:1 AV Block pacemaker 02/21/16 -- extensive cards eval before, DX with autonomic insufficiency ~ 02-2015 --on a  abd binder , pyridostigmine, rx midodrine 06-2015 per cards   Traumatic SAH (after a syncope),TBI--- admitted 01-2015 , d/c to a rehab unit temporarily -- was rx seroquel (aparently for ICU delirium) -- d/c 07-2015 --rx topamax when at rehab for post [facility]  HAs -- had issues w/b/b incontinence, better as of 07-2015 Tremors LUE, onset 9 -2017  PLAN  Prediabetes: Diet controlled, check a A1c Paresthesias: new problem. Neuropathy?.  Most recent TSH normal.  Checking A1c, will also check a J18, B1, folic acid.  Symptoms are not very bothersome so we agreed on observation for now. Preventive care  discussed. RTC 4 to 5 months

## 2017-12-31 NOTE — Patient Instructions (Signed)
GO TO THE LAB : Get the blood work     GO TO THE FRONT DESK Schedule your next appointment for a  Check up in 4-5 months

## 2017-12-31 NOTE — Progress Notes (Signed)
Pre visit review using our clinic review tool, if applicable. No additional management support is needed unless otherwise documented below in the visit note. 

## 2018-01-01 NOTE — Assessment & Plan Note (Signed)
Prediabetes: Diet controlled, check a A1c Paresthesias: new problem. Neuropathy?.  Most recent TSH normal.  Checking A1c, will also check a C65, B1, folic acid.  Symptoms are not very bothersome so we agreed on observation for now. Preventive care discussed. RTC 4 to 5 months

## 2018-01-04 LAB — VITAMIN B1: Vitamin B1 (Thiamine): 23 nmol/L (ref 8–30)

## 2018-01-05 ENCOUNTER — Other Ambulatory Visit: Payer: PPO | Admitting: *Deleted

## 2018-01-05 DIAGNOSIS — R079 Chest pain, unspecified: Secondary | ICD-10-CM

## 2018-01-05 DIAGNOSIS — Z01812 Encounter for preprocedural laboratory examination: Secondary | ICD-10-CM | POA: Diagnosis not present

## 2018-01-06 ENCOUNTER — Telehealth: Payer: Self-pay | Admitting: Cardiology

## 2018-01-06 LAB — BASIC METABOLIC PANEL
BUN/Creatinine Ratio: 18 (ref 12–28)
BUN: 13 mg/dL (ref 8–27)
CALCIUM: 9 mg/dL (ref 8.7–10.3)
CO2: 25 mmol/L (ref 20–29)
Chloride: 101 mmol/L (ref 96–106)
Creatinine, Ser: 0.74 mg/dL (ref 0.57–1.00)
GFR calc Af Amer: 93 mL/min/{1.73_m2} (ref >59)
GFR, EST NON AFRICAN AMERICAN: 81 mL/min/{1.73_m2} (ref >59)
Glucose: 119 mg/dL — ABNORMAL HIGH (ref 65–99)
POTASSIUM: 4.2 mmol/L (ref 3.5–5.2)
SODIUM: 140 mmol/L (ref 134–144)

## 2018-01-06 NOTE — Telephone Encounter (Signed)
Reviewed CT instructions w/ pt. Pt verbalized understanding and thanks me for reviewing w/ her again.

## 2018-01-06 NOTE — Telephone Encounter (Signed)
New Message   Patient is calling because she has some questions about the upcoming CT she is to have. She is insisting on speaking with the nurse. Please call to discuss.

## 2018-01-10 ENCOUNTER — Telehealth: Payer: Self-pay

## 2018-01-10 NOTE — Telephone Encounter (Signed)
Pt scheduled Nov 7 @ 2:15pm

## 2018-01-10 NOTE — Telephone Encounter (Signed)
Copied from [place]. Topic: Appointment Scheduling - Scheduling Inquiry for Clinic >> Jan 07, 2018  4:47 PM Reyne Dumas L wrote: Reason for CRM:   Pt states she believes she should be starting B12 injections and wants to set those up.

## 2018-01-10 NOTE — Telephone Encounter (Signed)
Please call Pt and schedule nurse visit for B12 injections. Thank you.

## 2018-01-11 ENCOUNTER — Ambulatory Visit (INDEPENDENT_AMBULATORY_CARE_PROVIDER_SITE_OTHER): Payer: PPO | Admitting: Gastroenterology

## 2018-01-11 ENCOUNTER — Encounter: Payer: Self-pay | Admitting: Gastroenterology

## 2018-01-11 VITALS — BP 154/70 | HR 76 | Ht 65.0 in | Wt 128.2 lb

## 2018-01-11 DIAGNOSIS — K5904 Chronic idiopathic constipation: Secondary | ICD-10-CM

## 2018-01-11 DIAGNOSIS — R1013 Epigastric pain: Secondary | ICD-10-CM | POA: Diagnosis not present

## 2018-01-11 NOTE — Progress Notes (Signed)
    History of Present Illness: This is a 72 year old female who relates many years of constipation.  She feels her constipation has worsened over the past couple months.  She takes mag citrate intermittently when constipated.  She is tried MiraLAX intermittently previously as well.  She has ongoing intermittent epigastric pain and nausea.  Extensive evaluation previously outlined was unremarkable.  She states she wants to see Dr. Donne Hazel regarding further evaluation of her symptoms and possible gallbladder disease.  Colonoscopy September 2019: - Three 7 to 8 mm polyps in the sigmoid colon and in the transverse colon, removed with a cold snare. Resected and retrieved.  - Mild diverticulosis in the left colon. There was no evidence of diverticular bleeding. - The examination was otherwise normal on direct and retroflexion views. Polyp pathology: Tubular adenomas and a sessile serrated polyp  Current Medications, Allergies, Past Medical History, Past Surgical History, Family History and Social History were reviewed in [employer] record.  Physical Exam: General: Well developed, well nourished, no acute distress Head: Normocephalic and atraumatic Eyes:  sclerae anicteric, EOMI Ears: Normal auditory acuity Mouth: No deformity or lesions Lungs: Clear throughout to auscultation Heart: Regular rate and rhythm; no murmurs, rubs or bruits Abdomen: Soft, non tender and non distended. No masses, hepatosplenomegaly or hernias noted. Normal Bowel sounds Rectal: Not done given recent colonoscopy Musculoskeletal: Symmetrical with no gross deformities  Pulses:  Normal pulses noted Extremities: No clubbing, cyanosis, edema or deformities noted Neurological: Alert oriented x 4, grossly nonfocal Psychological:  Alert and cooperative. Normal mood and affect   Assessment and Recommendations:  1. Chronic idiopathic constipation. Metoprolol, Simemet could be contributing.  She rarely  takes tramadol.  Patient advised to begin MiraLAX once daily, every day and titrate up to 2 doses per day to achieve a complete bowel movement once or twice daily.  2.  Epigastric pain and nausea felt secondary to functional dyspepsia.  We discussed referral to [facility] functional bowel disorder center for further evaluation.  She is advised to call if she would like to proceed with [facility] referral.  I advised that she had an adequate evaluation for biliary disease several months ago which was negative and there would little value to have a consultation with a general surgeon such as Dr. Donne Hazel.  Trial of FDgard 1-2 po tid ac.   3.  Personal history of adenomatous and sessile serrated polyps.  A 3-year interval surveillance colonoscopy is recommended in September 2022.

## 2018-01-11 NOTE — Patient Instructions (Signed)
Take over the counter Miralax mixing 17 grams in 8 oz of water every other day up to twice daily titrate depending on your bowel movements.   Start the samples of FD Gard 1-2 capsules before meals three times a day. You can purchase more over the counter.   Thank you for choosing me and [facility].  Pricilla Riffle. Dagoberto Ligas., MD., Marval Regal

## 2018-01-12 ENCOUNTER — Ambulatory Visit (HOSPITAL_COMMUNITY)
Admission: RE | Admit: 2018-01-12 | Discharge: 2018-01-12 | Disposition: A | Discharge status: Home / Self Care | Payer: PPO | Source: Ambulatory Visit | Attending: Cardiology | Admitting: Cardiology

## 2018-01-12 ENCOUNTER — Ambulatory Visit (HOSPITAL_COMMUNITY): Admission: RE | Admit: 2018-01-12 | Payer: PPO | Source: Ambulatory Visit

## 2018-01-12 DIAGNOSIS — I209 Angina pectoris, unspecified: Secondary | ICD-10-CM

## 2018-01-12 DIAGNOSIS — J9811 Atelectasis: Secondary | ICD-10-CM | POA: Insufficient documentation

## 2018-01-12 DIAGNOSIS — R079 Chest pain, unspecified: Secondary | ICD-10-CM | POA: Diagnosis not present

## 2018-01-12 DIAGNOSIS — Z950 Presence of cardiac pacemaker: Secondary | ICD-10-CM | POA: Diagnosis not present

## 2018-01-12 LAB — POCT I-STAT CREATININE: Creatinine, Ser: 0.7 mg/dL (ref 0.44–1.00)

## 2018-01-12 MED ORDER — IOPAMIDOL (ISOVUE-370) INJECTION 76%
100.0000 mL | Freq: Once | INTRAVENOUS | Status: AC | PRN
  Administered 2018-01-12: 80 mL via INTRAVENOUS

## 2018-01-12 MED ORDER — NITROGLYCERIN 0.4 MG SL SUBL
0.8000 mg | SUBLINGUAL_TABLET | SUBLINGUAL | Status: DC | PRN
  Administered 2018-01-12: 0.8 mg via SUBLINGUAL
  Filled 2018-01-12: qty 25

## 2018-01-12 MED ORDER — NITROGLYCERIN 0.4 MG SL SUBL
SUBLINGUAL_TABLET | SUBLINGUAL | Status: AC
  Filled 2018-01-12: qty 2

## 2018-01-18 LAB — CUP PACEART REMOTE DEVICE CHECK
Battery Remaining Longevity: 100 mo
Brady Statistic AP VP Percent: 0.01 %
Brady Statistic AP VS Percent: 3.96 %
Brady Statistic AS VS Percent: 96 %
Brady Statistic RA Percent Paced: 3.97 %
Brady Statistic RV Percent Paced: 0.03 %
Date Time Interrogation Session: 20190926185129
Implantable Lead Implant Date: 20171208
Implantable Lead Implant Date: 20171208
Implantable Lead Location: 753859
Implantable Lead Model: 5076
Implantable Pulse Generator Implant Date: 20171208
Lead Channel Impedance Value: 475 Ohm
Lead Channel Impedance Value: 532 Ohm
Lead Channel Impedance Value: 608 Ohm
Lead Channel Pacing Threshold Amplitude: 0.375 V
Lead Channel Sensing Intrinsic Amplitude: 2.625 mV
Lead Channel Sensing Intrinsic Amplitude: 22.875 mV
Lead Channel Setting Pacing Amplitude: 2.5 V
Lead Channel Setting Sensing Sensitivity: 4 mV
MDC IDC LEAD LOCATION: 753860
MDC IDC MSMT BATTERY VOLTAGE: 3.02 V
MDC IDC MSMT LEADCHNL RA PACING THRESHOLD AMPLITUDE: 0.75 V
MDC IDC MSMT LEADCHNL RA PACING THRESHOLD PULSEWIDTH: 0.4 ms
MDC IDC MSMT LEADCHNL RA SENSING INTR AMPL: 2.625 mV
MDC IDC MSMT LEADCHNL RV IMPEDANCE VALUE: 475 Ohm
MDC IDC MSMT LEADCHNL RV PACING THRESHOLD PULSEWIDTH: 0.4 ms
MDC IDC MSMT LEADCHNL RV SENSING INTR AMPL: 22.875 mV
MDC IDC SET LEADCHNL RA PACING AMPLITUDE: 2 V
MDC IDC SET LEADCHNL RV PACING PULSEWIDTH: 0.4 ms
MDC IDC STAT BRADY AS VP PERCENT: 0.03 %

## 2018-01-20 ENCOUNTER — Ambulatory Visit (INDEPENDENT_AMBULATORY_CARE_PROVIDER_SITE_OTHER): Payer: PPO

## 2018-01-20 DIAGNOSIS — Z23 Encounter for immunization: Secondary | ICD-10-CM | POA: Diagnosis not present

## 2018-01-20 DIAGNOSIS — E538 Deficiency of other specified B group vitamins: Secondary | ICD-10-CM

## 2018-01-20 MED ORDER — CYANOCOBALAMIN 1000 MCG/ML IJ SOLN
1000.0000 ug | Freq: Once | INTRAMUSCULAR | Status: AC
  Administered 2018-01-20: 1000 ug via INTRAMUSCULAR

## 2018-01-20 NOTE — Progress Notes (Signed)
Pre visit review using our clinic tool,if applicable. No additional management support is needed unless otherwise documented below in the visit note.   Pt here for monthly B12 injection per Dr. Larose Kells. Patient had B12 deficiency.  B12 1061mcg given IM, and pt tolerated injection well.  Next B12 injection scheduled for 1 week.

## 2018-01-25 ENCOUNTER — Ambulatory Visit (INDEPENDENT_AMBULATORY_CARE_PROVIDER_SITE_OTHER): Payer: PPO

## 2018-01-25 DIAGNOSIS — E538 Deficiency of other specified B group vitamins: Secondary | ICD-10-CM | POA: Diagnosis not present

## 2018-01-25 MED ORDER — CYANOCOBALAMIN 1000 MCG/ML IJ SOLN
1000.0000 ug | Freq: Once | INTRAMUSCULAR | Status: AC
  Administered 2018-01-25: 1000 ug via INTRAMUSCULAR

## 2018-01-25 NOTE — Progress Notes (Signed)
Pre visit review using our clinic tool,if applicable. No additional management support is needed unless otherwise documented below in the visit note.   Pt here for monthly B12 injection per order from Dr. Kathlene November.  B12 1072mcg given IM right deltoid, and pt tolerated injection well. Np com[plaints voiced.  Next B12 injection scheduled for February 01, 2018

## 2018-02-01 ENCOUNTER — Ambulatory Visit (INDEPENDENT_AMBULATORY_CARE_PROVIDER_SITE_OTHER): Payer: PPO

## 2018-02-01 DIAGNOSIS — E538 Deficiency of other specified B group vitamins: Secondary | ICD-10-CM

## 2018-02-01 MED ORDER — CYANOCOBALAMIN 1000 MCG/ML IJ SOLN
1000.0000 ug | Freq: Once | INTRAMUSCULAR | Status: AC
  Administered 2018-02-01: 1000 ug via INTRAMUSCULAR

## 2018-02-01 NOTE — Progress Notes (Signed)
Pre visit review using our clinic tool,if applicable. No additional management support is needed unless otherwise documented below in the visit note.   Pt here for monthly B12 injection per   B12 1035mcg given IM left deltoid. Patient tolerated well. No complaints voiced.Marland Kitchen  Next B12 injection scheduled for 1 week.

## 2018-02-09 ENCOUNTER — Ambulatory Visit (INDEPENDENT_AMBULATORY_CARE_PROVIDER_SITE_OTHER): Payer: PPO

## 2018-02-09 DIAGNOSIS — E538 Deficiency of other specified B group vitamins: Secondary | ICD-10-CM

## 2018-02-09 MED ORDER — CYANOCOBALAMIN 1000 MCG/ML IJ SOLN
1000.0000 ug | Freq: Once | INTRAMUSCULAR | Status: AC
  Administered 2018-02-09: 1000 ug via INTRAMUSCULAR

## 2018-02-09 NOTE — Progress Notes (Signed)
Pre visit review using our clinic tool,if applicable. No additional management support is needed unless otherwise documented below in the visit note.   Pt here for monthly B12 injection per order from Dr. Larose Kells.  B12 1027mcg given IMright deltoid, and pt tolerated injection well.  Next B12 injection scheduled for 121/27/19. Patient aware.

## 2018-03-10 ENCOUNTER — Ambulatory Visit (INDEPENDENT_AMBULATORY_CARE_PROVIDER_SITE_OTHER): Payer: PPO

## 2018-03-10 DIAGNOSIS — I441 Atrioventricular block, second degree: Secondary | ICD-10-CM | POA: Diagnosis not present

## 2018-03-10 LAB — CUP PACEART REMOTE DEVICE CHECK
Battery Remaining Longevity: 98 mo
Battery Voltage: 3.02 V
Brady Statistic AP VS Percent: 3.63 %
Brady Statistic RA Percent Paced: 3.65 %
Date Time Interrogation Session: 20191226140414
Implantable Lead Implant Date: 20171208
Implantable Lead Location: 753859
Implantable Lead Location: 753860
Implantable Lead Model: 5076
Lead Channel Impedance Value: 532 Ohm
Lead Channel Pacing Threshold Amplitude: 0.75 V
Lead Channel Pacing Threshold Pulse Width: 0.4 ms
Lead Channel Sensing Intrinsic Amplitude: 18.875 mV
Lead Channel Sensing Intrinsic Amplitude: 2.625 mV
Lead Channel Sensing Intrinsic Amplitude: 2.625 mV
Lead Channel Setting Pacing Amplitude: 2.5 V
Lead Channel Setting Sensing Sensitivity: 4 mV
MDC IDC LEAD IMPLANT DT: 20171208
MDC IDC MSMT LEADCHNL RA IMPEDANCE VALUE: 456 Ohm
MDC IDC MSMT LEADCHNL RA IMPEDANCE VALUE: 589 Ohm
MDC IDC MSMT LEADCHNL RV IMPEDANCE VALUE: 456 Ohm
MDC IDC MSMT LEADCHNL RV PACING THRESHOLD AMPLITUDE: 0.5 V
MDC IDC MSMT LEADCHNL RV PACING THRESHOLD PULSEWIDTH: 0.4 ms
MDC IDC MSMT LEADCHNL RV SENSING INTR AMPL: 18.875 mV
MDC IDC PG IMPLANT DT: 20171208
MDC IDC SET LEADCHNL RA PACING AMPLITUDE: 2 V
MDC IDC SET LEADCHNL RV PACING PULSEWIDTH: 0.4 ms
MDC IDC STAT BRADY AP VP PERCENT: 0.01 %
MDC IDC STAT BRADY AS VP PERCENT: 0.04 %
MDC IDC STAT BRADY AS VS PERCENT: 96.32 %
MDC IDC STAT BRADY RV PERCENT PACED: 0.05 %

## 2018-03-11 ENCOUNTER — Ambulatory Visit (INDEPENDENT_AMBULATORY_CARE_PROVIDER_SITE_OTHER): Payer: PPO

## 2018-03-11 DIAGNOSIS — E538 Deficiency of other specified B group vitamins: Secondary | ICD-10-CM

## 2018-03-11 MED ORDER — CYANOCOBALAMIN 1000 MCG/ML IJ SOLN
1000.0000 ug | Freq: Once | INTRAMUSCULAR | Status: AC
  Administered 2018-03-11: 1000 ug via INTRAMUSCULAR

## 2018-03-11 NOTE — Progress Notes (Addendum)
Pt. Here for monthly B12 injection.  B12 1036mcg given in L deltoid IM, pt tolerated well.   Next appointment made for 1/30 with nurse.   Agree  Kathlene November, MD

## 2018-03-15 ENCOUNTER — Encounter: Payer: Self-pay | Admitting: Cardiology

## 2018-03-15 NOTE — Progress Notes (Signed)
Remote pacemaker transmission.   

## 2018-04-07 ENCOUNTER — Ambulatory Visit (INDEPENDENT_AMBULATORY_CARE_PROVIDER_SITE_OTHER): Payer: PPO | Admitting: Family Medicine

## 2018-04-07 ENCOUNTER — Encounter: Payer: Self-pay | Admitting: Family Medicine

## 2018-04-07 ENCOUNTER — Other Ambulatory Visit: Payer: Self-pay

## 2018-04-07 VITALS — BP 120/84 | HR 83 | Temp 97.3°F

## 2018-04-07 DIAGNOSIS — R040 Epistaxis: Secondary | ICD-10-CM

## 2018-04-07 NOTE — Patient Outreach (Signed)
[facility]) Care Management  04/07/2018  Mandy Durham [birth year] [id number]   Referral Date: 04/06/2018 Referral Source: Nurseline Referral Reason: Nosebleeds   Outreach Attempt: spoke with patient. She acknowledges nosebleeds.  Asked patient to describe nosebleeds.  She states when nosebleeds happen it is a little more than a drip.  Patient denies taking any blood thinner medication.  Discussed with patient importance of notifying physician.  She states she will be.  Discussed with patient measure to help with nosebleeds such as humidification in the home as heat tends to dry nasal passages.  Also discussed use on nasal spray to help but if bleeding is out of control to seek emergency care.  She verbalized understanding and voices no other needs at this time.    Plan: RN CM will close case.    Jone Baseman, RN, MSN [facility] Care Management Care Management Coordinator Direct Line [phone] Toll Free: [phone]  Fax: [phone]

## 2018-04-07 NOTE — Progress Notes (Signed)
Chief Complaint  Patient presents with  . Epistaxis    Subjective: Patient is a 73 y.o. female here for nosebleeds.  Pt has had nosebleeds from R side of nose over past day. Has been packing and using vaseline over past day. Usually goes forward. Not on any blood thinners.   ROS: HEENT: +nosebleeds  Past Medical History:  Diagnosis Date  . Anxiety state, unspecified   . Hyperlipidemia   . Hypertension   . Insomnia   . LBBB (left bundle branch block)    LHC in 2002 showed normal coronaries.   . Osteoarthrosis, unspecified whether generalized or localized, unspecified site   . Overweight(278.02)   . S/P placement of cardiac pacemaker    a. 02/21/16: Medtronic Advisa DR MRI SureScan model [id number] (serial number [id number])   . Serrated adenoma of colon 2007  . Symptomatic menopausal or female climacteric states   . Syncope and collapse    11/12: Holter 12/12 with rare PACS, HR range 64-140, average 82, no significant arrhythmias. Echo (1/13): EF 50-55%, mild LVH, septal-lateral dyssynchrony c/w LBBB. 3-week event monitor (1/13): No significant arrhythmia.     Objective: BP 120/84 (BP Location: Left Arm, Patient Position: Sitting, Cuff Size: Normal)   Pulse 83   Temp (!) 97.3 F (36.3 C) (Oral)   SpO2 97%  General: Awake, appears stated age HEENT: small excoriation on R-nare septum over plexus, L side unremarkable Lungs: No accessory muscle use Psych: Age appropriate judgment and insight, normal affect and mood  Assessment and Plan: Epistaxis  Offered cauterization but she declined.  I do not think this is mandatory today.  Recommended use of an air humidifier, twice daily use of triple antibiotic ointment on the right side, intermediate use of Vaseline if needed.  Do not pick nose.  Do not vigorously blow nose. Follow-up as originally scheduled with regular PCP. The patient voiced understanding and agreement to the plan.  [place], DO 04/07/18  4:52  PM

## 2018-04-07 NOTE — Patient Instructions (Signed)
Triple antibiotic ointment twice daily like Neosporin. Use Vaseline in between. Use a Q tip. This is coming from the R side.   Use an air humidifier at night and at home.  Apply pressure if you have another one.  Seek care if one lasts longer than 30 minutes.   Let us know if you need anything.

## 2018-04-07 NOTE — Progress Notes (Signed)
Pre visit review using our clinic review tool, if applicable. No additional management support is needed unless otherwise documented below in the visit note. 

## 2018-04-12 ENCOUNTER — Ambulatory Visit (INDEPENDENT_AMBULATORY_CARE_PROVIDER_SITE_OTHER): Payer: PPO | Admitting: *Deleted

## 2018-04-12 DIAGNOSIS — E538 Deficiency of other specified B group vitamins: Secondary | ICD-10-CM

## 2018-04-12 MED ORDER — CYANOCOBALAMIN 1000 MCG/ML IJ SOLN
1000.0000 ug | Freq: Once | INTRAMUSCULAR | Status: AC
  Administered 2018-04-12: 1000 ug via INTRAMUSCULAR

## 2018-04-12 NOTE — Progress Notes (Addendum)
Patient here today for b12 injection.   Patient given injection.  She wanted to know how long she is going to be taking these injections.  Advised to discuss with pcp at next visit.    Kathlene November, MD

## 2018-04-14 ENCOUNTER — Ambulatory Visit: Payer: PPO

## 2018-05-02 ENCOUNTER — Other Ambulatory Visit: Payer: Self-pay | Admitting: Internal Medicine

## 2018-05-03 DIAGNOSIS — Z01419 Encounter for gynecological examination (general) (routine) without abnormal findings: Secondary | ICD-10-CM | POA: Diagnosis not present

## 2018-05-03 DIAGNOSIS — Z6822 Body mass index (BMI) 22.0-22.9, adult: Secondary | ICD-10-CM | POA: Diagnosis not present

## 2018-05-03 DIAGNOSIS — Z1231 Encounter for screening mammogram for malignant neoplasm of breast: Secondary | ICD-10-CM | POA: Diagnosis not present

## 2018-05-03 LAB — HM MAMMOGRAPHY

## 2018-05-25 DIAGNOSIS — M816 Localized osteoporosis [Lequesne]: Secondary | ICD-10-CM | POA: Diagnosis not present

## 2018-05-25 DIAGNOSIS — D259 Leiomyoma of uterus, unspecified: Secondary | ICD-10-CM | POA: Diagnosis not present

## 2018-05-25 DIAGNOSIS — R1031 Right lower quadrant pain: Secondary | ICD-10-CM | POA: Diagnosis not present

## 2018-05-25 DIAGNOSIS — N958 Other specified menopausal and perimenopausal disorders: Secondary | ICD-10-CM | POA: Diagnosis not present

## 2018-06-01 ENCOUNTER — Ambulatory Visit: Payer: PPO | Admitting: Internal Medicine

## 2018-06-09 ENCOUNTER — Other Ambulatory Visit: Payer: Self-pay

## 2018-06-09 ENCOUNTER — Ambulatory Visit (INDEPENDENT_AMBULATORY_CARE_PROVIDER_SITE_OTHER): Payer: PPO | Admitting: *Deleted

## 2018-06-09 DIAGNOSIS — I441 Atrioventricular block, second degree: Secondary | ICD-10-CM | POA: Diagnosis not present

## 2018-06-09 DIAGNOSIS — I428 Other cardiomyopathies: Secondary | ICD-10-CM

## 2018-06-09 DIAGNOSIS — I429 Cardiomyopathy, unspecified: Secondary | ICD-10-CM

## 2018-06-11 LAB — CUP PACEART REMOTE DEVICE CHECK
Brady Statistic AP VP Percent: 0.01 %
Brady Statistic AP VS Percent: 7.38 %
Brady Statistic AS VP Percent: 0.09 %
Brady Statistic AS VS Percent: 92.52 %
Brady Statistic RA Percent Paced: 7.39 %
Implantable Lead Implant Date: 20171208
Implantable Lead Location: 753859
Implantable Lead Model: 5076
Implantable Lead Model: 5076
Lead Channel Impedance Value: 456 Ohm
Lead Channel Impedance Value: 475 Ohm
Lead Channel Impedance Value: 608 Ohm
Lead Channel Pacing Threshold Amplitude: 0.375 V
Lead Channel Pacing Threshold Pulse Width: 0.4 ms
Lead Channel Pacing Threshold Pulse Width: 0.4 ms
Lead Channel Sensing Intrinsic Amplitude: 2.5 mV
Lead Channel Sensing Intrinsic Amplitude: 24.25 mV
Lead Channel Sensing Intrinsic Amplitude: 24.25 mV
Lead Channel Setting Pacing Amplitude: 2 V
Lead Channel Setting Pacing Amplitude: 2.5 V
Lead Channel Setting Pacing Pulse Width: 0.4 ms
MDC IDC LEAD IMPLANT DT: 20171208
MDC IDC LEAD LOCATION: 753860
MDC IDC MSMT BATTERY REMAINING LONGEVITY: 96 mo
MDC IDC MSMT BATTERY VOLTAGE: 3.02 V
MDC IDC MSMT LEADCHNL RA PACING THRESHOLD AMPLITUDE: 0.75 V
MDC IDC MSMT LEADCHNL RA SENSING INTR AMPL: 2.5 mV
MDC IDC MSMT LEADCHNL RV IMPEDANCE VALUE: 532 Ohm
MDC IDC PG IMPLANT DT: 20171208
MDC IDC SESS DTM: 20200326203323
MDC IDC SET LEADCHNL RV SENSING SENSITIVITY: 4 mV
MDC IDC STAT BRADY RV PERCENT PACED: 0.11 %

## 2018-06-16 ENCOUNTER — Ambulatory Visit: Payer: PPO | Admitting: Adult Health

## 2018-06-16 NOTE — Progress Notes (Signed)
Remote pacemaker transmission.   

## 2018-06-29 ENCOUNTER — Telehealth: Payer: Self-pay

## 2018-06-29 ENCOUNTER — Ambulatory Visit: Payer: PPO | Admitting: Neurology

## 2018-06-29 NOTE — Telephone Encounter (Signed)
Spoke w/ Pt- due for visit- offered virtual visit- declined for now. Again made her aware of importance of visit and she stated she would discuss w/ Eddie, Pt's husband and call back.

## 2018-07-07 ENCOUNTER — Telehealth: Payer: Self-pay | Admitting: Neurology

## 2018-07-07 ENCOUNTER — Encounter: Payer: Self-pay | Admitting: Neurology

## 2018-07-07 NOTE — Telephone Encounter (Signed)
Called the patient to inform them that our office has placed new protocols in place for our office visits. Due to Covid 19 our office is reducing our number of office visits in order to minimize the risk to our patients and healthcare providers.Our office is now providing the capability to offer the patients virtual visits at this time. Pt would like to do the Telephone visit for her follow up. Informed of what that process looks like and informed that the phone visit will still be billed through insurance as such. Due to [place],informed the patient since the appointment is taking place over the phone/internet app, we can't guarantee the security of the phone line. With that said if we do move forward I would have to get verbal consent to complete the Video Visit/Phone call. Patient gave verbal consent to move forward with the phone visit. I have reviewed the patient's chart and made sure that everything is up to date. Patient is also made aware that since this is a phone visit we are able to complete the visit but a physical exam is not able to be done since the patient is not present in person. Pt informed that the front staff will contact the patient aprox 30 minutes prior to the scheduled appointment to "check them in" and make sure that everything is ready for the appointment to get started. Pt verbalized understanding of this information and will states to be ready for the visit at least 15-30 min prior to the visit.

## 2018-07-07 NOTE — Telephone Encounter (Signed)
Called the patient to inform them that our office has placed new protocols in place for our office visits. Due to Covid 19 our office is reducing our number of office visits in order to minimize the risk to our patients and healthcare providers. There was no answer. lvm informing the patient to call back and we can discuss video visit or phone visit since she is a follow up.

## 2018-07-07 NOTE — Addendum Note (Signed)
Addended by: Darleen Crocker on: 07/07/2018 01:56 PM   Modules accepted: Orders

## 2018-07-07 NOTE — Telephone Encounter (Signed)
Pt calle din and stated she cant do a VV , but she can do a Tele Visit .

## 2018-07-13 ENCOUNTER — Encounter: Payer: Self-pay | Admitting: Neurology

## 2018-07-13 ENCOUNTER — Other Ambulatory Visit: Payer: Self-pay

## 2018-07-13 ENCOUNTER — Ambulatory Visit (INDEPENDENT_AMBULATORY_CARE_PROVIDER_SITE_OTHER): Payer: PPO | Admitting: Neurology

## 2018-07-13 DIAGNOSIS — G214 Vascular parkinsonism: Secondary | ICD-10-CM | POA: Diagnosis not present

## 2018-07-13 MED ORDER — CARBIDOPA-LEVODOPA 25-100 MG PO TABS: 1.0000 | ORAL_TABLET | Freq: Three times a day (TID) | ORAL | 3 refills | Status: DC

## 2018-07-13 NOTE — Progress Notes (Signed)
[facility]   Provider:  Larey Seat, M D  Referring Provider: Colon Branch, MD Primary Care Physician:  Colon Branch, MD  Virtual Visit via Video Note  I connected with Mandy Durham on 07/13/18 at  2:30 PM EDT by a video enabled telemedicine application and verified that I am speaking with the correct person using two identifiers.  Location: Patient: home Provider:[facility] - in office    I discussed the limitations of evaluation and management by telemedicine and the availability of in person appointments. The patient expressed understanding and agreed to proceed.  History of Present Illness:  HPI:  Lani MAYGEN SIRICO is a 73 y.o. female , seen here as a referral  from Dr. Kathlene November for a subarachnoid traumatic bleed , sequelae evaluation.   Mrs Bencivenga had originally asked for this appointment due to incontinence following a SAH. She had fainted, fell and aquiered the TBI with SAH. She states that the incontinence  is improved. She still has some urine leakage and soft stools. She wonders if this is still a neurologic impairment from the [facility]. She  has no pain with bowel movements, currently no headaches or nausea. She remains on Mestinon to protect her from fainting spells.  Patient was last seen in April 2019 by Venancio Poisson, NP.  She had tried carbidopa - Ldopa, and it had initially helped her tremors, which I believed to be essential tremor.  She now reports that the tremor is no longer controlled. She is on bid Sinemet 25/ 100 mg.     Observations/Objective: no dysphonia, no vocal tremor.    Assessment and Plan: increase to TID sinemet, 30 minutes before mealtime. The patient is not diabetic, no history of gastroparesis.    Follow Up Instructions: patient will call in 3 month and report of observation, effect on tremor.  RV with Dory Peru, NP  in alternating fashion.     I discussed the assessment and treatment plan with the patient. The patient was  provided an opportunity to ask questions and all were answered. The patient agreed with the plan and demonstrated an understanding of the instructions.   The patient was advised to call back or seek an in-person evaluation if the symptoms worsen or if the condition fails to improve as anticipated.  I provided 12 minutes of non-face-to-face time during this encounter.   Larey Seat, MD    HPI:  Mandy Durham is a 73 y.o. female , seen here as a referral  from Dr. Kathlene November for a subarachnoid traumatic bleed , sequelae evaluation.   Mrs Quant had originally asked for this appointment due to incontinence following a SAH. She had fainted, fell and aquiered the TBI with SAH. She states that the incontinence  is improved. She still has some urine leakage and soft stools. She wonders if this is still a neurologic impairment from the [facility]. She  has no pain with bowel movements, currently no headaches or nausea. She remains on Mestinon to protect her from fainting spells.  History from 02/12/2016 CD: I have originally seen Mrs. Buehl for one visit in February of 2016,  She has continued to have difficulties since suffering a sub-arachnoid traumatic hemorrhage. She states that ever since she had the fall she has sometimes leaked nasal clear fluid and this discharge had increased over the last couple of month and weeks. She also has more headaches. Her primary care physician, Dr. past, called me yesterday and asked  if I could see her on an urgent basis. There is another very evident development : she has a significant tremor in both upper extremities and she sits here with her legs crossed and holds her left hand with her right to suppress the amplitude of movement. The subarachnoid hemorrhage affected the right brain, her trauma begun in the left hand over the last 3 months it has exacerbated to involve both upper extremities and it has a flapping and sometimes pill-rolling movement characteristic. There  was no rigor over her biceps and her last visit. Her headaches are located right between the eyes above the root of the nose and she feels dizzy and lightheaded. The dizziness is a new phenomenon, the headaches preceded the dizziness. Some of her headaches are associated with nausea, the headaches do not radiate to other parts of the face or skull, she does not have visual changes with headaches. She does not have photophobia. She does not wake up with these headaches.   Interval history 06/14/17: Patient is being seen today for follow-up medication refill appointment.  Patient has been doing well without noticeable action or resting tremors.  She does state when she is walking at times she will feel her hands bilaterally start to tremor but this is only on occasion and is mild.  She continues to take Sinemet 25-100 3 times a day without side effects.  She has been having multiple stomach issues regarding nausea but has been is being followed by a GI specialist.  Denies dizziness, lightheadedness or headaches.  No new concerns at today's appointment and would like a one-year refill for her Sinemet.   Review of Systems: Out of a complete 14 system review, the patient complains of only the following symptoms, and all other reviewed systems are negative.  Tremors   Social History   Socioeconomic History  . Marital status: Married    Spouse name: [name]  . Number of children: 2  . Years of education: Not on file  . Highest education level: Not on file  Occupational History  . Occupation: retired Environmental consultant: [employer]  . Financial resource strain: Not on file  . Food insecurity:    Worry: Not on file    Inability: Not on file  . Transportation needs:    Medical: Not on file    Non-medical: Not on file  Tobacco Use  . Smoking status: Never Smoker  . Smokeless tobacco: Never Used  Substance and Sexual Activity  . Alcohol use: No  . Drug  use: No  . Sexual activity: Not on file  Lifestyle  . Physical activity:    Days per week: Not on file    Minutes per session: Not on file  . Stress: Not on file  Relationships  . Social connections:    Talks on phone: Not on file    Gets together: Not on file    Attends religious service: Not on file    Active member of club or organization: Not on file    Attends meetings of clubs or organizations: Not on file    Relationship status: Not on file  . Intimate partner violence:    Fear of current or ex partner: Not on file    Emotionally abused: Not on file    Physically abused: Not on file    Forced sexual activity: Not on file  Other Topics Concern  . Not on file  Social History Narrative  HSG.  Married '65.  2 daughters, '71, '77; 4 grandchildren          Family History  Problem Relation Age of Onset  . Heart failure Mother   . Coronary artery disease Mother   . Dementia Mother   . Diabetes Mother   . Colon cancer Neg Hx   . Breast cancer Neg Hx   . Hypertension Neg Hx   . Heart disease Neg Hx   . Heart attack Neg Hx   . Stroke Neg Hx   . Rectal cancer Neg Hx   . Stomach cancer Neg Hx   . Esophageal cancer Neg Hx   . Liver disease Neg Hx     Past Medical History:  Diagnosis Date  . Anxiety state, unspecified   . Hyperlipidemia   . Hypertension   . Insomnia   . LBBB (left bundle branch block)    LHC in 2002 showed normal coronaries.   . Osteoarthrosis, unspecified whether generalized or localized, unspecified site   . Overweight(278.02)   . S/P placement of cardiac pacemaker    a. 02/21/16: Medtronic Advisa DR MRI SureScan model [id number] (serial number [id number])   . Serrated adenoma of colon 2007  . Symptomatic menopausal or female climacteric states   . Syncope and collapse    11/12: Holter 12/12 with rare PACS, HR range 64-140, average 82, no significant arrhythmias. Echo (1/13): EF 50-55%, mild LVH, septal-lateral dyssynchrony c/w LBBB. 3-week event  monitor (1/13): No significant arrhythmia.     Past Surgical History:  Procedure Laterality Date  . COLONOSCOPY  2007  . EP IMPLANTABLE DEVICE N/A 02/21/2016   Procedure: Pacemaker Implant;  Surgeon: Will Meredith Leeds, MD;  Location: [place] [facility];  Service: Cardiovascular;  Laterality: N/A;  . POLYPECTOMY      Current Outpatient Medications  Medication Sig Dispense Refill  . acetaminophen (TYLENOL) 500 MG tablet Take 500 mg by mouth every 6 (six) hours as needed.    . carbidopa-levodopa (SINEMET IR) 25-100 MG tablet Take 1 tablet by mouth 3 (three) times daily. 270 tablet 4  . Cholecalciferol (VITAMIN D PO) Take 1 tablet by mouth once a week. Vitamin D    . ezetimibe (ZETIA) 10 MG tablet Take 1 tablet (10 mg total) by mouth daily. 90 tablet 3  . ibandronate (BONIVA) 150 MG tablet ibandronate 150 mg tablet  TAKE 1 TABLET BY MOUTH ONCE EVERY MONTH IN THE MORNING FOR 30 DAYS    . metoprolol tartrate (LOPRESSOR) 25 MG tablet Take 1 tablet (25 mg total) by mouth 2 (two) times daily. Please hold until patient request refill 180 tablet 3   No current facility-administered medications for this visit.     Allergies as of 07/13/2018  . (No Known Allergies)    Vitals: There were no vitals taken for this visit. Last Weight:  Wt Readings from Last 1 Encounters:  01/11/18 128 lb 3.2 oz (58.2 kg)   JKK:XFGHW is no height or weight on file to calculate BMI.     Last Height:   Ht Readings from Last 1 Encounters:  01/11/18 5\' 5"  (1.651 m)    Physical exam:  General: The patient is awake, alert and appears not in acute distress. The patient is well groomed.  Pleasant elderly Caucasian female. Head: Normocephalic, atraumatic.  High grade Retrognathia is seen.  Cardiovascular:  Regular rate and rhythm, without  murmurs or carotid bruit, and without distended neck veins. Respiratory: Lungs are clear to auscultation.  Skin:  Without evidence of edema, or rash Trunk: BMI is normal  The  patient's posture is erect  Neurologic exam : The patient is awake and alert, oriented to place and time.   Memory subjective described as intact.  Attention span & concentration ability appears normal.  Speech is mild stuttering, non fluent,  without dysarthria, dysphonia .  Mood and affect are appropriate.  Cranial nerves: Pupils are equal and briskly reactive to light.  Funduscopic exam without evidence of pallor or edema.  Visual fields by finger perimetry are intact. Hearing to finger rub intact.   Facial sensation intact to fine touch.  Facial motor strength is symmetric and tongue and uvula move midline. Shoulder shrug was symmetrical.   Motor exam:   5/5 strength in all 4 extremities  Sensory:  Fine touch, pinprick and vibration, Proprioception normal.  Coordination: Rapid alternating movements in the fingers/hands was normal. Finger-to-nose maneuver; no tremor observed  Gait and station: Gait is smooth with normal stride.  No tremors observed while ambulating.  Stance is normal  Deep tendon reflexes: in the  upper and lower extremities are symmetric and intact.     Assessment:  Mandy Durham is a 73 year old female with a history of SAH, TBI, HLD, HTN and tremor. Patient was on a beta blocker, benzodiaepine, topiramate and mysoline for her tremors. She was unable to tolerate mysoline and was started on Sinemet 25/100 TID.  She returns today for medication refill appointment has been doing well overall without observed tremors at today's appointment.    Plan:   -Continue sinemet 25-100 three times per day -Continue to stay active and eat healthy -Follow up in 1 year or call earlier if needed with Jinny Blossom, NP -Advised patient to speak to PCP regarding taking aspirin 81 mg daily for stroke prevention as patient does have HLD and HTN   Greater than 50% time during this 25  minute consultation visit was spent on counseling and coordination of care about tremors and use of  Sinemet.  Educated on importance of exercise and maintaining a healthy diet.  Patient has no questions or concerns at today's appointment.  Advised patient to  Venancio Poisson, [facility]  [facility] [address] [place], [place] [address]  Phone [phone] Fax [phone]

## 2018-07-13 NOTE — Patient Instructions (Signed)
Carbidopa; Levodopa tablets What is this medicine? CARBIDOPA;LEVODOPA (kar bi DOE pa; lee voe DOE pa) is used to treat the symptoms of Parkinson's disease. This medicine may be used for other purposes; ask your health care provider or pharmacist if you have questions. COMMON BRAND NAME(S): Atamet, SINEMET What should I tell my health care provider before I take this medicine? They need to know if you have any of these conditions: -asthma or lung disease -depression or other mental illness -diabetes -glaucoma -heart disease, including history of a heart attack -irregular heart beat -kidney or liver disease -melanoma or suspicious skin lesions -stomach or intestine ulcers -an unusual or allergic reaction to levodopa, carbidopa, other medicines, foods, dyes, or preservatives -pregnant or trying to get pregnant -breast-feeding How should I use this medicine? Take this medicine by mouth with a glass of water. Follow the directions on the prescription label. Take your doses at regular intervals.  Take 30-45 minutes before you eat !! Do  take your medicine three times a day.   What may interact with this medicine? Do not take this medicine with any of the following medications: -MAOIs like Marplan, Nardil, and Parnate -reserpine -tetrabenazine This medicine may also interact with the following medications: -alcohol -droperidol -entacapone -iron supplements or multivitamins with iron -isoniazid, INH -linezolid -medicines for depression, anxiety, or psychotic disturbances -medicines for high blood pressure -medicines for sleep -metoclopramide -papaverine -procarbazine -tedizolid -rasagiline -selegiline -tolcapone This list may not describe all possible interactions. Give your health care provider a list of all the medicines, herbs, non-prescription drugs, or dietary supplements you use. Also tell them if you smoke, drink alcohol, or use illegal drugs. Some items may interact with  your medicine. What should I watch for while using this medicine? Visit your doctor or health care professional for regular checks on your progress. It may be several weeks or months before you feel the full benefits of this medicine. Continue to take your medicine on a regular schedule. Do not take any additional medicines for Parkinson's disease without first consulting with your health care provider. You may experience a wearing of effect prior to the time for your next dose of this medicine. You may also experience an on-off effect where the medicine apparently stops working for anything from a minute to several hours, then suddenly starts working again. Tell your doctor or health care professional if any of these symptoms happen to you. Your dose may need to be changed. A high protein diet can slow or prevent absorption of this medicine. Avoid high protein foods near the time of taking this medicine to help to prevent these problems. Take this medicine at least 30 minutes before eating or one hour after meals. You may want to eat higher protein foods later in the day or in small amounts. Discuss your diet with your doctor or health care professional or nutritionist. You may get drowsy or dizzy. Do not drive, use machinery, or do anything that needs mental alertness until you know how this drug affects you. Do not stand or sit up quickly, especially if you are an older patient. This reduces the risk of dizzy or fainting spells. Alcohol can make you more drowsy and dizzy. Avoid alcoholic drinks. If you find that you have sudden feelings of wanting to sleep during normal activities, like cooking, watching television, or while driving or riding in a car, you should contact your health care professional. If you are diabetic, this medicine may interfere with the accuracy of some  tests for sugar or ketones in the urine (does not interfere with blood tests). Check with your doctor or health care professional  before changing the dose of your diabetic medicine. This medicine may discolor the urine or sweat, making it look darker or red in color. This is of no cause for concern. However, this may stain clothing or fabrics. There have been reports of increased sexual urges or other strong urges such as gambling while taking some medicines for Parkinson's disease. If you experience any of these urges while taking this medicine, you should report it to your health care provider as soon as possible. You should check your skin often for changes to moles and new growths while taking this medicine. Call your doctor if you notice any of these changes. This medicine may cause a decrease in vitamin B6. You should make sure that you get enough vitamin B6 while you are taking this medicine. Discuss the foods you eat and the vitamins you take with your health care professional. What side effects may I notice from receiving this medicine? Side effects that you should report to your doctor or health care professional as soon as possible: -allergic reactions like skin rash, itching or hives, swelling of the face, lips, or tongue -anxiety, confusion, or nervousness -falling asleep during normal activities like driving -fast, irregular heartbeat -hallucination, loss of contact with reality -mood changes like aggressive behavior, depression -stomach pain -trouble passing urine -uncontrolled movements of the mouth, head, hands, feet, shoulders, eyelids or other unusual muscle movements Side effects that usually do not require medical attention (report to your doctor or health care professional if they continue or are bothersome): -headache -loss of appetite -muscle twitches -nausea, vomiting -nightmares, trouble sleeping -unusually weak or tired This list may not describe all possible side effects. Call your doctor for medical advice about side effects. You may report side effects to FDA at 1-800-FDA-1088. Where should  I keep my medicine? Keep out of the reach of children. Store at room temperature between 15 and 30 degrees C (59 and 86 degrees F). Protect from light. Throw away any unused medicine after the expiration date. NOTE: This sheet is a summary. It may not cover all possible information. If you have questions about this medicine, talk to your doctor, pharmacist, or health care provider.  2019 Elsevier/Gold Standard (2016-10-09 08:21:48)

## 2018-08-01 ENCOUNTER — Other Ambulatory Visit: Payer: Self-pay

## 2018-08-01 ENCOUNTER — Telehealth: Payer: Self-pay

## 2018-08-01 ENCOUNTER — Ambulatory Visit (INDEPENDENT_AMBULATORY_CARE_PROVIDER_SITE_OTHER): Payer: PPO | Admitting: Internal Medicine

## 2018-08-01 ENCOUNTER — Encounter: Payer: Self-pay | Admitting: Internal Medicine

## 2018-08-01 DIAGNOSIS — E538 Deficiency of other specified B group vitamins: Secondary | ICD-10-CM | POA: Diagnosis not present

## 2018-08-01 DIAGNOSIS — R5383 Other fatigue: Secondary | ICD-10-CM | POA: Diagnosis not present

## 2018-08-01 DIAGNOSIS — I10 Essential (primary) hypertension: Secondary | ICD-10-CM | POA: Diagnosis not present

## 2018-08-01 DIAGNOSIS — R7303 Prediabetes: Secondary | ICD-10-CM | POA: Diagnosis not present

## 2018-08-01 NOTE — Telephone Encounter (Signed)
Virtual visit scheduled.  

## 2018-08-01 NOTE — Telephone Encounter (Signed)
Copied from [place] [id number]. Topic: General - Other >> Aug 01, 2018  9:49 AM Mandy Durham wrote: Pt call to say she no longer is getting the B12 shot and is asking if she can start back, she said she is tried and weak and thinking this is the reason

## 2018-08-01 NOTE — Progress Notes (Signed)
Subjective:    Patient ID: Mandy Durham, female    DOB: June 24, 1945, 73 y.o.   MRN: [id number]  DOS:  08/01/2018 Type of visit - description: Attempted  to make this a video visit, due to technical difficulties from the patient side it was not possible  thus we proceeded with a Virtual Visit via Telephone    I connected with@ on 08/02/18 at  1:40 PM EDT by telephone and verified that I am speaking with the correct person using two identifiers.  THIS ENCOUNTER IS A VIRTUAL VISIT DUE TO COVID-19 - PATIENT WAS NOT SEEN IN THE OFFICE. PATIENT HAS CONSENTED TO VIRTUAL VISIT / TELEMEDICINE VISIT   Location of patient: home  Location of provider: office  I discussed the limitations, risks, security and privacy concerns of performing an evaluation and management service by telephone and the availability of in person appointments. I also discussed with the patient that there may be a patient responsible charge related to this service. The patient expressed understanding and agreed to proceed.   History of Present Illness: Acute visit The patient's main concern is lack of energy, this has gradually developed over the last few weeks. B12 deficiency: Diagnosed last year, got B12 injections, she had to stop them approximately 03-2018 due to COVID-19. Prediabetes: Due for labs Parkinson's: Neurology note reviewed   Review of Systems  Denies fever chills No chest pain no difficulty breathing No lower extremity edema No palpitations No cough No recent syncope No anxiety or depression that she can tell.  Past Medical History:  Diagnosis Date  . Anxiety state, unspecified   . Hyperlipidemia   . Hypertension   . Insomnia   . LBBB (left bundle branch block)    LHC in 2002 showed normal coronaries.   . Osteoarthrosis, unspecified whether generalized or localized, unspecified site   . Overweight(278.02)   . S/P placement of cardiac pacemaker    a. 02/21/16: Medtronic Advisa DR MRI SureScan  model [id number] (serial number [id number])   . Serrated adenoma of colon 2007  . Symptomatic menopausal or female climacteric states   . Syncope and collapse    11/12: Holter 12/12 with rare PACS, HR range 64-140, average 82, no significant arrhythmias. Echo (1/13): EF 50-55%, mild LVH, septal-lateral dyssynchrony c/w LBBB. 3-week event monitor (1/13): No significant arrhythmia.     Past Surgical History:  Procedure Laterality Date  . COLONOSCOPY  2007  . EP IMPLANTABLE DEVICE N/A 02/21/2016   Procedure: Pacemaker Implant;  Surgeon: Will Meredith Leeds, MD;  Location: [facility];  Service: Cardiovascular;  Laterality: N/A;  . POLYPECTOMY      Social History   Socioeconomic History  . Marital status: Married    Spouse name: [name]  . Number of children: 2  . Years of education: Not on file  . Highest education level: Not on file  Occupational History  . Occupation: retired Environmental consultant: [employer]  . Financial resource strain: Not on file  . Food insecurity:    Worry: Not on file    Inability: Not on file  . Transportation needs:    Medical: Not on file    Non-medical: Not on file  Tobacco Use  . Smoking status: Never Smoker  . Smokeless tobacco: Never Used  Substance and Sexual Activity  . Alcohol use: No  . Drug use: No  . Sexual activity: Not on file  Lifestyle  . Physical  activity:    Days per week: Not on file    Minutes per session: Not on file  . Stress: Not on file  Relationships  . Social connections:    Talks on phone: Not on file    Gets together: Not on file    Attends religious service: Not on file    Active member of club or organization: Not on file    Attends meetings of clubs or organizations: Not on file    Relationship status: Not on file  . Intimate partner violence:    Fear of current or ex partner: Not on file    Emotionally abused: Not on file    Physically abused: Not on file     Forced sexual activity: Not on file  Other Topics Concern  . Not on file  Social History Narrative   HSG.  Married '65.  2 daughters, '71, '77; 4 grandchildren            Allergies as of 08/01/2018   No Known Allergies     Medication List       Accurate as of Aug 01, 2018 11:59 PM. If you have any questions, ask your nurse or doctor.        acetaminophen 500 MG tablet Commonly known as:  TYLENOL Take 500 mg by mouth every 6 (six) hours as needed.   carbidopa-levodopa 25-100 MG tablet Commonly known as:  SINEMET IR Take 1 tablet by mouth 3 (three) times daily.   ezetimibe 10 MG tablet Commonly known as:  ZETIA Take 1 tablet (10 mg total) by mouth daily.   ibandronate 150 MG tablet Commonly known as:  BONIVA ibandronate 150 mg tablet  TAKE 1 TABLET BY MOUTH ONCE EVERY MONTH IN THE MORNING FOR 30 DAYS   metoprolol tartrate 25 MG tablet Commonly known as:  LOPRESSOR Take 1 tablet (25 mg total) by mouth 2 (two) times daily. Please hold until patient request refill   VITAMIN D PO Take 1 tablet by mouth once a week. Vitamin D           Objective:   Physical Exam There were no vitals taken for this visit. This was virtual phone visit, she is alert oriented x3, does not seem to be anxious or depressed, speech is nonfluent but at baseline.    Assessment     Assessment Prediabetes  HTN Hyperlipidemia, statin intolerant Anxiety, insomnia-- xanax, UDS low risk 07-2015 Menopausal; DEXA 2012 -1.5 @ gyn Recurrent Syncope --3:1 AV Block pacemaker 02/21/16 -- extensive cards eval before, DX with autonomic insufficiency ~ 02-2015 --on a  abd binder , pyridostigmine, rx midodrine 06-2015 per cards  Traumatic SAH (after a syncope),TBI--- admitted 01-2015 , d/c to a rehab unit temporarily -- was rx seroquel (aparently for ICU delirium) -- d/c 07-2015 --rx topamax when at rehab for post [facility]  HAs -- had issues w/b/b incontinence, better as of 07-2015 Tremors LUE, onset 9  -2017  PLAN  Fatigue: ROS does not point to any specific etiology.  We are doing general labs, including a B12.  Reassess in a couple of months Prediabetes: Check A1c and CMP HTN: On low-dose metoprolol, will check BP when she comes back for nurse visit.  Check CBC and CMP B12 deficiency: Diagnosed late 2019, got several B12 injection, last was 04/12/2018, now c/o  fatigue.  Will restart injections, also recommend OTC supplements as it will be very taxing for her to come weekly x4 for injections during day quarantine.  Plan: Schedule a nurse visit: Needs labs, BP check, B12 injection Next appointment with me 2 months.     I discussed the assessment and treatment plan with the patient. The patient was provided an opportunity to ask questions and all were answered. The patient agreed with the plan and demonstrated an understanding of the instructions.   The patient was advised to call back or seek an in-person evaluation if the symptoms worsen or if the condition fails to improve as anticipated.  I provided 25  minutes of non-face-to-face time during this encounter.  Kathlene November, MD

## 2018-08-01 NOTE — Telephone Encounter (Signed)
Needs virtual visit 

## 2018-08-02 NOTE — Assessment & Plan Note (Signed)
Fatigue: ROS does not point to any specific etiology.  We are doing general labs, including a B12.  Reassess in a couple of months Prediabetes: Check A1c and CMP HTN: On low-dose metoprolol, will check BP when she comes back for nurse visit.  Check CBC and CMP B12 deficiency: Diagnosed late 2019, got several B12 injection, last was 04/12/2018, now c/o  fatigue.  Will restart injections, also recommend OTC supplements as it will be very taxing for her to come weekly x4 for injections during day quarantine. Plan: Schedule a nurse visit: Needs labs, BP check, B12 injection Next appointment with me 2 months.

## 2018-08-03 ENCOUNTER — Ambulatory Visit: Payer: PPO

## 2018-08-04 ENCOUNTER — Telehealth: Payer: Self-pay | Admitting: Neurology

## 2018-08-04 NOTE — Telephone Encounter (Signed)
Called the patient to schedule a follow up apt 4-6 mths. No answer. LVM for the pt to call back and schedule with NP

## 2018-08-09 ENCOUNTER — Ambulatory Visit (INDEPENDENT_AMBULATORY_CARE_PROVIDER_SITE_OTHER): Payer: PPO

## 2018-08-09 ENCOUNTER — Other Ambulatory Visit: Payer: Self-pay

## 2018-08-09 ENCOUNTER — Other Ambulatory Visit (INDEPENDENT_AMBULATORY_CARE_PROVIDER_SITE_OTHER): Payer: PPO

## 2018-08-09 DIAGNOSIS — I10 Essential (primary) hypertension: Secondary | ICD-10-CM

## 2018-08-09 DIAGNOSIS — E538 Deficiency of other specified B group vitamins: Secondary | ICD-10-CM

## 2018-08-09 DIAGNOSIS — R7303 Prediabetes: Secondary | ICD-10-CM | POA: Diagnosis not present

## 2018-08-09 LAB — COMPREHENSIVE METABOLIC PANEL
ALT: 18 U/L (ref 0–35)
AST: 18 U/L (ref 0–37)
Albumin: 3.9 g/dL (ref 3.5–5.2)
Alkaline Phosphatase: 83 U/L (ref 39–117)
BUN: 13 mg/dL (ref 6–23)
CO2: 31 mEq/L (ref 19–32)
Calcium: 9.2 mg/dL (ref 8.4–10.5)
Chloride: 101 mEq/L (ref 96–112)
Creatinine, Ser: 0.75 mg/dL (ref 0.40–1.20)
GFR: 75.56 mL/min (ref >60.00)
Glucose, Bld: 83 mg/dL (ref 70–99)
Potassium: 4.1 mEq/L (ref 3.5–5.1)
Sodium: 140 mEq/L (ref 135–145)
Total Bilirubin: 0.4 mg/dL (ref 0.2–1.2)
Total Protein: 6.5 g/dL (ref 6.0–8.3)

## 2018-08-09 LAB — CBC WITH DIFFERENTIAL/PLATELET
Basophils Absolute: 0 10*3/uL (ref 0.0–0.1)
Basophils Relative: 0.4 % (ref 0.0–3.0)
Eosinophils Absolute: 0 10*3/uL (ref 0.0–0.7)
Eosinophils Relative: 0.5 % (ref 0.0–5.0)
HCT: 41.2 % (ref 36.0–46.0)
Hemoglobin: 14 g/dL (ref 12.0–15.0)
Lymphocytes Relative: 23.1 % (ref 12.0–46.0)
Lymphs Abs: 1.7 10*3/uL (ref 0.7–4.0)
MCHC: 34 g/dL (ref 30.0–36.0)
MCV: 86.1 fl (ref 78.0–100.0)
Monocytes Absolute: 0.5 10*3/uL (ref 0.1–1.0)
Monocytes Relative: 6.6 % (ref 3.0–12.0)
Neutro Abs: 5.2 10*3/uL (ref 1.4–7.7)
Neutrophils Relative %: 69.4 % (ref 43.0–77.0)
Platelets: 317 10*3/uL (ref 150.0–400.0)
RBC: 4.78 Mil/uL (ref 3.87–5.11)
RDW: 13.8 % (ref 11.5–15.5)
WBC: 7.5 10*3/uL (ref 4.0–10.5)

## 2018-08-09 LAB — HEMOGLOBIN A1C: Hgb A1c MFr Bld: 6.4 % (ref 4.6–6.5)

## 2018-08-09 LAB — VITAMIN B12: Vitamin B-12: >1500 pg/mL — ABNORMAL HIGH (ref 211–911)

## 2018-08-09 MED ORDER — CYANOCOBALAMIN 1000 MCG/ML IJ SOLN
1000.0000 ug | Freq: Once | INTRAMUSCULAR | Status: AC
  Administered 2018-08-09: 1000 ug via INTRAMUSCULAR

## 2018-08-09 NOTE — Progress Notes (Addendum)
Pt here today for B12 injection.   Cyanocobalamin 1027mcg/m:  1mL injected into R deltoid. Pt tolerated injection well.   Next in 4 weeks.   Kathlene November, MD

## 2018-08-26 ENCOUNTER — Telehealth: Payer: Self-pay

## 2018-08-26 NOTE — Telephone Encounter (Signed)
LMOM informing Pt of results. Instructed to call if questions/concerns.

## 2018-08-26 NOTE — Telephone Encounter (Signed)
Notes recorded by Damita Dunnings, CMA on 08/12/2018 at 7:54 AM EDT  Results released.  ------   Notes recorded by Colon Branch, MD on 08/11/2018 at 3:03 PM EDT  Release these results to MyChart with attached comments  Your blood work looks very good. Continue taking the same medications

## 2018-08-26 NOTE — Telephone Encounter (Signed)
Copied from [place] [id number]. Topic: Quick Communication - Lab Results (Clinic Use ONLY) >> Aug 26, 2018 11:32 AM Lennox Solders wrote: Pt computer is not working and she would like may 2020 blood work results

## 2018-09-06 ENCOUNTER — Ambulatory Visit (INDEPENDENT_AMBULATORY_CARE_PROVIDER_SITE_OTHER): Payer: PPO | Admitting: Internal Medicine

## 2018-09-06 ENCOUNTER — Encounter: Payer: Self-pay | Admitting: Internal Medicine

## 2018-09-06 ENCOUNTER — Other Ambulatory Visit: Payer: Self-pay

## 2018-09-06 VITALS — BP 157/73 | HR 67 | Temp 98.2°F | Resp 16 | Ht 65.0 in | Wt 137.5 lb

## 2018-09-06 DIAGNOSIS — E538 Deficiency of other specified B group vitamins: Secondary | ICD-10-CM | POA: Diagnosis not present

## 2018-09-06 DIAGNOSIS — I10 Essential (primary) hypertension: Secondary | ICD-10-CM

## 2018-09-06 DIAGNOSIS — I441 Atrioventricular block, second degree: Secondary | ICD-10-CM

## 2018-09-06 DIAGNOSIS — K30 Functional dyspepsia: Secondary | ICD-10-CM

## 2018-09-06 DIAGNOSIS — R5383 Other fatigue: Secondary | ICD-10-CM | POA: Diagnosis not present

## 2018-09-06 DIAGNOSIS — G214 Vascular parkinsonism: Secondary | ICD-10-CM | POA: Diagnosis not present

## 2018-09-06 MED ORDER — AMLODIPINE BESYLATE 5 MG PO TABS: 5.0000 mg | ORAL_TABLET | Freq: Every day | ORAL | 3 refills | Status: DC

## 2018-09-06 NOTE — Progress Notes (Signed)
Pre visit review using our clinic review tool, if applicable. No additional management support is needed unless otherwise documented below in the visit note. 

## 2018-09-06 NOTE — Patient Instructions (Signed)
   GO TO THE FRONT DESK Schedule your next appointment   for a checkup in 6 weeks  Continue all your medications including your B12 shot monthly  Add a medication called amlodipine 5 mg 1 tablet daily, issued helps your blood pressure  Check the  blood pressure.  Weekly Goal is between 110/65 and  135/85. If it is consistently higher or lower, let me know

## 2018-09-06 NOTE — Progress Notes (Signed)
Subjective:    Patient ID: Mandy Durham, female    DOB: Dominigue 12, 1947, 73 y.o.   MRN: [id number]  DOS:  09/06/2018 Type of visit - description: Here to discuss several issues Continue with fatigue, no change or improvement. Has chronic upper GI problems, worse postprandially.  Better with OTC but "not completely well". BP was elevated upon arrival.  It  was rechecked by me. Last visit with cardiology reviewed  Wt Readings from Last 3 Encounters:  09/06/18 137 lb 8 oz (62.4 kg)  01/11/18 128 lb 3.2 oz (58.2 kg)  12/31/17 123 lb 2 oz (55.8 kg)    Review of Systems Denies fever chills, no weight loss. No chest pain no difficulty breathing or lower extremity edema   I did notice weight gain Occasionally anxious but denies the need for medication. Occasionally sleepy and anxious sometimes.  Past Medical History:  Diagnosis Date  . Anxiety state, unspecified   . Hyperlipidemia   . Hypertension   . Insomnia   . LBBB (left bundle branch block)    LHC in 2002 showed normal coronaries.   . Osteoarthrosis, unspecified whether generalized or localized, unspecified site   . Overweight(278.02)   . S/P placement of cardiac pacemaker    a. 02/21/16: Medtronic Advisa DR MRI SureScan model [id number] (serial number [id number])   . Serrated adenoma of colon 2007  . Symptomatic menopausal or female climacteric states   . Syncope and collapse    11/12: Holter 12/12 with rare PACS, HR range 64-140, average 82, no significant arrhythmias. Echo (1/13): EF 50-55%, mild LVH, septal-lateral dyssynchrony c/w LBBB. 3-week event monitor (1/13): No significant arrhythmia.     Past Surgical History:  Procedure Laterality Date  . COLONOSCOPY  2007  . EP IMPLANTABLE DEVICE N/A 02/21/2016   Procedure: Pacemaker Implant;  Surgeon: Will Meredith Leeds, MD;  Location: [facility];  Service: Cardiovascular;  Laterality: N/A;  . POLYPECTOMY      Social History   Socioeconomic History  . Marital  status: Married    Spouse name: [name]  . Number of children: 2  . Years of education: Not on file  . Highest education level: Not on file  Occupational History  . Occupation: retired Environmental consultant: [employer]  . Financial resource strain: Not on file  . Food insecurity    Worry: Not on file    Inability: Not on file  . Transportation needs    Medical: Not on file    Non-medical: Not on file  Tobacco Use  . Smoking status: Never Smoker  . Smokeless tobacco: Never Used  Substance and Sexual Activity  . Alcohol use: No  . Drug use: No  . Sexual activity: Not on file  Lifestyle  . Physical activity    Days per week: Not on file    Minutes per session: Not on file  . Stress: Not on file  Relationships  . Social Herbalist on phone: Not on file    Gets together: Not on file    Attends religious service: Not on file    Active member of club or organization: Not on file    Attends meetings of clubs or organizations: Not on file    Relationship status: Not on file  . Intimate partner violence    Fear of current or ex partner: Not on file    Emotionally abused: Not on file  Physically abused: Not on file    Forced sexual activity: Not on file  Other Topics Concern  . Not on file  Social History Narrative   HSG.  Married '65.  2 daughters, '71, '77; 4 grandchildren            Allergies as of 09/06/2018   No Known Allergies     Medication List       Accurate as of Kensy 23, 2020 11:59 PM. If you have any questions, ask your nurse or doctor.        acetaminophen 500 MG tablet Commonly known as: TYLENOL Take 500 mg by mouth every 6 (six) hours as needed.   amLODipine 5 MG tablet Commonly known as: NORVASC Take 1 tablet (5 mg total) by mouth daily. Started by: Kathlene November, MD   carbidopa-levodopa 25-100 MG tablet Commonly known as: SINEMET IR Take 1 tablet by mouth 3 (three) times daily.   ezetimibe 10  MG tablet Commonly known as: ZETIA Take 1 tablet (10 mg total) by mouth daily.   ibandronate 150 MG tablet Commonly known as: BONIVA ibandronate 150 mg tablet  TAKE 1 TABLET BY MOUTH ONCE EVERY MONTH IN THE MORNING FOR 30 DAYS   metoprolol tartrate 25 MG tablet Commonly known as: LOPRESSOR Take 1 tablet (25 mg total) by mouth 2 (two) times daily. Please hold until patient request refill   VITAMIN D PO Take 1 tablet by mouth once a week. Vitamin D           Objective:   Physical Exam BP (!) 157/73 (BP Location: Right Arm, Patient Position: Sitting, Cuff Size: Small)   Pulse 67   Temp 98.2 F (36.8 C) (Oral)   Resp 16   Ht 5\' 5"  (1.651 m)   Wt 137 lb 8 oz (62.4 kg)   SpO2 100%   BMI 22.88 kg/m  General:   Well developed, NAD, BMI noted.  HEENT:  Normocephalic . Face symmetric, atraumatic Neck: No thyromegaly, no JVD at 45 degrees Lungs:  CTA B Normal respiratory effort, no intercostal retractions, no accessory muscle use. Heart: RRR,  no murmur.  no pretibial edema bilaterally  Abdomen:  Not distended, soft, non-tender. No rebound or rigidity.   Skin: Not pale. Not jaundice Neurologic:  alert & oriented X3.  Speech slightly hesitant but at baseline. Psych--  Cognition and judgment appear intact.  Cooperative with normal attention span and concentration.  Behavior appropriate. No anxious or depressed appearing.     Assessment     Assessment Prediabetes  HTN Hyperlipidemia, statin intolerant Anxiety, insomnia-- xanax, UDS low risk 07-2015 Menopausal; DEXA 2012 -1.5 @ gyn Recurrent Syncope --3:1 AV Block pacemaker 02/21/16 -- extensive cards eval before, DX with autonomic insufficiency ~ 02-2015 --on a  abd binder , pyridostigmine, rx midodrine 06-2015 per cards  Traumatic SAH (after a syncope),TBI--- admitted 01-2015 , d/c to a rehab unit temporarily -- was rx seroquel (aparently for ICU delirium) -- d/c 07-2015 --rx topamax when at rehab for post [facility]   HAs -- had issues w/b/b incontinence, better as of 07-2015 Tremors LUE, onset 9 -2017 Osteopneia: on Boniva, rx elsewhere  PLAN  Fatigue:  ongoing issue, on chart review this is been complaint since at least 2017.  Recent labs very good.  Will do a TSH at the next opportunity. Etiology unclear, but likely multifactorial including Parkinson, autonomic insufficiency, anxiety, etc.  Will message neurology, sleep study? 3:1  AV block has visit with cardiology 11-2017, at that  time some symptoms suggested a stable angina, subsequently a CT coronary test was negative.Marland Kitchen History of Parkinson: Last visit with neurology 06/2018, no changes suggested. HTN: Currently on metoprolol, BP  slightly elevated upon arrival to the office.  I recheck manually: Left arm 150/85 Right arm 165/95. Add  amlodipine 5 mg, recheck in 6 weeks, watch for swelling. B12 deficiency continue with monthly shots Epigastric pain: Last visit with GI 01/11/2018, diagnosed with functional dyspepsia, symptoms partially better with OTC FDgard Despite improvement, patient remains focus on her symptoms.  She might need to see GI again.. RTC 6 weeks  Today, I spent more than  30  min with the patient: >50% of the time counseling regards fatigue, epigastric pain, I did extensive chart review.  I tried to reassure her that all the work-up so far has been satisfactory.  Patient remained very apprehensive

## 2018-09-07 DIAGNOSIS — G214 Vascular parkinsonism: Secondary | ICD-10-CM | POA: Insufficient documentation

## 2018-09-07 DIAGNOSIS — G20C Parkinsonism, unspecified: Secondary | ICD-10-CM | POA: Insufficient documentation

## 2018-09-07 NOTE — Assessment & Plan Note (Addendum)
Fatigue:  ongoing issue, on chart review this is been complaint since at least 2017.  Recent labs very good.  Will do a TSH at the next opportunity. Etiology unclear, but likely multifactorial including Parkinson, autonomic insufficiency, anxiety, etc.  Will message neurology, sleep study? 3:1  AV block has visit with cardiology 11-2017, at that time some symptoms suggested a stable angina, subsequently a CT coronary test was negative.Marland Kitchen History of Parkinson: Last visit with neurology 06/2018, no changes suggested. HTN: Currently on metoprolol, BP  slightly elevated upon arrival to the office.  I recheck manually: Left arm 150/85 Right arm 165/95. Add  amlodipine 5 mg, recheck in 6 weeks, watch for swelling. B12 deficiency continue with monthly shots Epigastric pain: Last visit with GI 01/11/2018, diagnosed with functional dyspepsia, symptoms partially better with OTC FDgard Despite improvement, patient remains focus on her symptoms.  She might need to see GI again.. RTC 6 weeks

## 2018-09-08 ENCOUNTER — Ambulatory Visit (INDEPENDENT_AMBULATORY_CARE_PROVIDER_SITE_OTHER): Payer: PPO | Admitting: *Deleted

## 2018-09-08 DIAGNOSIS — I441 Atrioventricular block, second degree: Secondary | ICD-10-CM | POA: Diagnosis not present

## 2018-09-09 ENCOUNTER — Ambulatory Visit (INDEPENDENT_AMBULATORY_CARE_PROVIDER_SITE_OTHER): Payer: PPO

## 2018-09-09 ENCOUNTER — Other Ambulatory Visit: Payer: Self-pay

## 2018-09-09 DIAGNOSIS — E538 Deficiency of other specified B group vitamins: Secondary | ICD-10-CM

## 2018-09-09 LAB — CUP PACEART REMOTE DEVICE CHECK
Battery Remaining Longevity: 94 mo
Battery Voltage: 3.02 V
Brady Statistic AP VP Percent: 0.01 %
Brady Statistic AP VS Percent: 6.47 %
Brady Statistic AS VP Percent: 0.96 %
Brady Statistic AS VS Percent: 92.55 %
Brady Statistic RA Percent Paced: 6.48 %
Brady Statistic RV Percent Paced: 1.04 %
Date Time Interrogation Session: 20200625182506
Implantable Lead Implant Date: 20171208
Implantable Lead Implant Date: 20171208
Implantable Lead Location: 753859
Implantable Lead Location: 753860
Implantable Lead Model: 5076
Implantable Lead Model: 5076
Implantable Pulse Generator Implant Date: 20171208
Lead Channel Impedance Value: 475 Ohm
Lead Channel Impedance Value: 494 Ohm
Lead Channel Impedance Value: 570 Ohm
Lead Channel Impedance Value: 608 Ohm
Lead Channel Pacing Threshold Amplitude: 0.5 V
Lead Channel Pacing Threshold Amplitude: 0.75 V
Lead Channel Pacing Threshold Pulse Width: 0.4 ms
Lead Channel Pacing Threshold Pulse Width: 0.4 ms
Lead Channel Sensing Intrinsic Amplitude: 2.5 mV
Lead Channel Sensing Intrinsic Amplitude: 2.5 mV
Lead Channel Sensing Intrinsic Amplitude: 22.625 mV
Lead Channel Sensing Intrinsic Amplitude: 22.625 mV
Lead Channel Setting Pacing Amplitude: 2 V
Lead Channel Setting Pacing Amplitude: 2.5 V
Lead Channel Setting Pacing Pulse Width: 0.4 ms
Lead Channel Setting Sensing Sensitivity: 4 mV

## 2018-09-09 MED ORDER — CYANOCOBALAMIN 1000 MCG/ML IJ SOLN
1000.0000 ug | Freq: Once | INTRAMUSCULAR | Status: AC
  Administered 2018-09-09: 1000 ug via INTRAMUSCULAR

## 2018-09-09 NOTE — Progress Notes (Addendum)
Pt here for monthly B12 injection per Dr. Paz  B12 1000mcg given IM in left deltoid, and pt tolerated injection well.  Next B12 injection scheduled for next month.  [name] Paz, MD  

## 2018-09-19 ENCOUNTER — Encounter: Payer: Self-pay | Admitting: Cardiology

## 2018-09-19 NOTE — Progress Notes (Signed)
Remote pacemaker transmission.   

## 2018-10-03 ENCOUNTER — Ambulatory Visit: Payer: PPO | Admitting: Internal Medicine

## 2018-10-04 ENCOUNTER — Telehealth: Payer: Self-pay | Admitting: Gastroenterology

## 2018-10-04 NOTE — Telephone Encounter (Signed)
Patient called wanting to speak with the doctor. She stated that she was advised by dr.stark that he knew of someone in [place] that can possibly help the patient with her problems she is having with her stomach. She is wanting to know the doctor name.

## 2018-10-05 NOTE — Telephone Encounter (Signed)
OK per 12/2017 office note.

## 2018-10-05 NOTE — Telephone Encounter (Signed)
Patient notified that she will be referred to [facility] and that she should expect a call from them directly to schedule the appointment.

## 2018-10-05 NOTE — Telephone Encounter (Signed)
Dr. Fuller Plan okay to refer her to [facility] functional bowel disease clinic?

## 2018-10-11 ENCOUNTER — Other Ambulatory Visit: Payer: Self-pay

## 2018-10-11 ENCOUNTER — Ambulatory Visit (INDEPENDENT_AMBULATORY_CARE_PROVIDER_SITE_OTHER): Payer: PPO

## 2018-10-11 DIAGNOSIS — E538 Deficiency of other specified B group vitamins: Secondary | ICD-10-CM | POA: Diagnosis not present

## 2018-10-11 MED ORDER — CYANOCOBALAMIN 1000 MCG/ML IJ SOLN
1000.0000 ug | Freq: Once | INTRAMUSCULAR | Status: AC
  Administered 2018-10-11: 12:00:00 1000 ug via INTRAMUSCULAR

## 2018-10-11 NOTE — Progress Notes (Addendum)
Pre visit review using our clinic review tool, if applicable. No additional management support is needed unless otherwise documented below in the visit note.  Patient here today for b12 injection. 103mL b12 given in patient's right deltoid IM.Patient tolerated well.next injection scheduled for next month.   Kathlene November, MD

## 2018-10-18 ENCOUNTER — Encounter: Payer: Self-pay | Admitting: Internal Medicine

## 2018-10-18 ENCOUNTER — Other Ambulatory Visit: Payer: Self-pay

## 2018-10-18 ENCOUNTER — Ambulatory Visit (INDEPENDENT_AMBULATORY_CARE_PROVIDER_SITE_OTHER): Payer: PPO | Admitting: Internal Medicine

## 2018-10-18 VITALS — BP 140/64 | HR 64 | Temp 97.6°F | Resp 18 | Ht 65.0 in | Wt 138.0 lb

## 2018-10-18 DIAGNOSIS — I10 Essential (primary) hypertension: Secondary | ICD-10-CM | POA: Diagnosis not present

## 2018-10-18 DIAGNOSIS — R5383 Other fatigue: Secondary | ICD-10-CM

## 2018-10-18 DIAGNOSIS — R609 Edema, unspecified: Secondary | ICD-10-CM

## 2018-10-18 DIAGNOSIS — Z09 Encounter for follow-up examination after completed treatment for conditions other than malignant neoplasm: Secondary | ICD-10-CM

## 2018-10-18 MED ORDER — LOSARTAN POTASSIUM 50 MG PO TABS: 50.0000 mg | ORAL_TABLET | Freq: Every day | ORAL | 1 refills | Status: DC

## 2018-10-18 NOTE — Patient Instructions (Addendum)
Please schedule Medicare Wellness with Glenard Haring.   GO TO THE FRONT DESK  Schedule labs to be done in 2 weeks, no need to be fasting  Schedule your next appointment for a physical exam in 4 months from today  Stop amlodipine Start losartan 50 mg 1 tablet daily The swelling should gradually decrease  Check the  blood pressure every other day BP GOAL is between 110/65 and  135/85. If it is consistently higher or lower, let me know

## 2018-10-18 NOTE — Progress Notes (Addendum)
Subjective:    Patient ID: Mandy Durham, female    DOB: [birth year], 73 y.o.   MRN: [id number]  DOS:  10/18/2018 Type of visit - description: Routine office visit HTN, started amlodipine, no ambulatory BPs, since he started the new medication has noted lower extremity edema around the feet and ankles. No other major concerns  Wt Readings from Last 3 Encounters:  10/18/18 138 lb (62.6 kg)  09/06/18 137 lb 8 oz (62.4 kg)  01/11/18 128 lb 3.2 oz (58.2 kg)     Review of Systems Denies fever chills No chest pain no difficulty breathing No cough  Past Medical History:  Diagnosis Date  . Anxiety state, unspecified   . Hyperlipidemia   . Hypertension   . Insomnia   . LBBB (left bundle branch block)    LHC in 2002 showed normal coronaries.   . Osteoarthrosis, unspecified whether generalized or localized, unspecified site   . Overweight(278.02)   . S/P placement of cardiac pacemaker    a. 02/21/16: Medtronic Advisa DR MRI SureScan model [id number] (serial number [id number])   . Serrated adenoma of colon 2007  . Symptomatic menopausal or female climacteric states   . Syncope and collapse    11/12: Holter 12/12 with rare PACS, HR range 64-140, average 82, no significant arrhythmias. Echo (1/13): EF 50-55%, mild LVH, septal-lateral dyssynchrony c/w LBBB. 3-week event monitor (1/13): No significant arrhythmia.     Past Surgical History:  Procedure Laterality Date  . COLONOSCOPY  2007  . EP IMPLANTABLE DEVICE N/A 02/21/2016   Procedure: Pacemaker Implant;  Surgeon: Will Meredith Leeds, MD;  Location: [facility];  Service: Cardiovascular;  Laterality: N/A;  . POLYPECTOMY      Social History   Socioeconomic History  . Marital status: Married    Spouse name: [name]  . Number of children: 2  . Years of education: Not on file  . Highest education level: Not on file  Occupational History  . Occupation: retired Environmental consultant: [employer]  . Financial resource strain: Not on file  . Food insecurity    Worry: Not on file    Inability: Not on file  . Transportation needs    Medical: Not on file    Non-medical: Not on file  Tobacco Use  . Smoking status: Never Smoker  . Smokeless tobacco: Never Used  Substance and Sexual Activity  . Alcohol use: No  . Drug use: No  . Sexual activity: Not on file  Lifestyle  . Physical activity    Days per week: Not on file    Minutes per session: Not on file  . Stress: Not on file  Relationships  . Social Herbalist on phone: Not on file    Gets together: Not on file    Attends religious service: Not on file    Active member of club or organization: Not on file    Attends meetings of clubs or organizations: Not on file    Relationship status: Not on file  . Intimate partner violence    Fear of current or ex partner: Not on file    Emotionally abused: Not on file    Physically abused: Not on file    Forced sexual activity: Not on file  Other Topics Concern  . Not on file  Social History Narrative   HSG.  Married '65.  2 daughters, '71, '77; 4 grandchildren  Allergies as of 10/18/2018   No Known Allergies     Medication List       Accurate as of October 18, 2018 11:59 PM. If you have any questions, ask your nurse or doctor.        STOP taking these medications   amLODipine 5 MG tablet Commonly known as: NORVASC Stopped by: Kathlene November, MD     TAKE these medications   acetaminophen 500 MG tablet Commonly known as: TYLENOL Take 500 mg by mouth every 6 (six) hours as needed.   carbidopa-levodopa 25-100 MG tablet Commonly known as: SINEMET IR Take 1 tablet by mouth 3 (three) times daily.   ezetimibe 10 MG tablet Commonly known as: ZETIA Take 1 tablet (10 mg total) by mouth daily.   ibandronate 150 MG tablet Commonly known as: BONIVA ibandronate 150 mg tablet  TAKE 1 TABLET BY MOUTH ONCE EVERY MONTH IN THE MORNING FOR 30 DAYS    losartan 50 MG tablet Commonly known as: COZAAR Take 1 tablet (50 mg total) by mouth daily. Started by: Kathlene November, MD   metoprolol tartrate 25 MG tablet Commonly known as: LOPRESSOR Take 1 tablet (25 mg total) by mouth 2 (two) times daily. Please hold until patient request refill   VITAMIN D PO Take 1 tablet by mouth once a week. Vitamin D           Objective:   Physical Exam BP 140/64 (BP Location: Left Arm, Patient Position: Sitting, Cuff Size: Small)   Pulse 64   Temp 97.6 F (36.4 C) (Oral)   Resp 18   Ht 5\' 5"  (1.651 m)   Wt 138 lb (62.6 kg)   SpO2 99%   BMI 22.96 kg/m  General:   Well developed, NAD, BMI noted. HEENT:  Normocephalic . Face symmetric, atraumatic Lungs:  CTA B Normal respiratory effort, no intercostal retractions, no accessory muscle use. Heart: RRR,  no murmur.  Trace pitting peri-ankle & feet edema bilaterally  Skin: Not pale. Not jaundice Neurologic:  alert & oriented X3.  Speech normal, gait appropriate for age and unassisted Psych--  Cognition and judgment appear intact.  Cooperative with normal attention span and concentration.  Behavior appropriate. No anxious or depressed appearing.      Assessment     Assessment Prediabetes  HTN : amlodipine: edema 10/2018 Hyperlipidemia, statin intolerant Anxiety, insomnia-- xanax, UDS low risk 07-2015 Menopausal; DEXA 2012 -1.5 @ gyn Recurrent Syncope --3:1 AV Block pacemaker 02/21/16 -- extensive cards eval before, DX with autonomic insufficiency ~ 02-2015 --on a  abd binder , pyridostigmine, rx midodrine 06-2015 per cards  Traumatic SAH (after a syncope),TBI--- admitted 01-2015 , d/c to a rehab unit temporarily -- was rx seroquel (aparently for ICU delirium) -- d/c 07-2015 --rx topamax when at rehab for [facility]  HAs -- had issues w/b/b incontinence, better as of 07-2015 Tremors LUE, onset 9 -2017 Osteopneia: on Boniva, rx elsewhere  PLAN  HTN: Amlodipine was added 09/06/2018, no  ambulatory BPs, BP today looks better however she has developed edema. Plan: Stop amlodipine, start losartan 50 mg, BMP in 2 weeks, monitor BPs.  Continue metoprolol Fatigue: See last visit, check a TSH Preventive care: Flu shot when available Anxiety, insomnia: We actually did PHQ 9 screening today, scored 4 which is much better than previous  screenings. RTC: 2 weeks for labs 4 months for CPX

## 2018-10-18 NOTE — Progress Notes (Signed)
Pre visit review using our clinic review tool, if applicable. No additional management support is needed unless otherwise documented below in the visit note. 

## 2018-10-19 NOTE — Assessment & Plan Note (Addendum)
HTN: Amlodipine was added 09/06/2018, no ambulatory BPs, BP today looks better however she has developed edema. Plan: Stop amlodipine, start losartan 50 mg, BMP in 2 weeks, monitor BPs.  Continue metoprolol Fatigue: See last visit, check a TSH Preventive care: Flu shot when available Anxiety, insomnia: We actually did PHQ 9 screening today, scored 4 which is much better than previous  screenings. RTC: 2 weeks for labs 4 months for CPX

## 2018-11-02 ENCOUNTER — Other Ambulatory Visit (INDEPENDENT_AMBULATORY_CARE_PROVIDER_SITE_OTHER): Payer: PPO

## 2018-11-02 ENCOUNTER — Other Ambulatory Visit: Payer: Self-pay

## 2018-11-02 DIAGNOSIS — I10 Essential (primary) hypertension: Secondary | ICD-10-CM

## 2018-11-02 LAB — BASIC METABOLIC PANEL
BUN: 15 mg/dL (ref 6–23)
CO2: 30 mEq/L (ref 19–32)
Calcium: 9.2 mg/dL (ref 8.4–10.5)
Chloride: 103 mEq/L (ref 96–112)
Creatinine, Ser: 0.84 mg/dL (ref 0.40–1.20)
GFR: 66.26 mL/min (ref >60.00)
Glucose, Bld: 96 mg/dL (ref 70–99)
Potassium: 4.5 mEq/L (ref 3.5–5.1)
Sodium: 139 mEq/L (ref 135–145)

## 2018-11-02 LAB — TSH: TSH: 1.27 u[IU]/mL (ref 0.35–4.50)

## 2018-11-08 ENCOUNTER — Ambulatory Visit (INDEPENDENT_AMBULATORY_CARE_PROVIDER_SITE_OTHER): Payer: PPO

## 2018-11-08 ENCOUNTER — Other Ambulatory Visit: Payer: Self-pay

## 2018-11-08 DIAGNOSIS — E538 Deficiency of other specified B group vitamins: Secondary | ICD-10-CM | POA: Diagnosis not present

## 2018-11-08 MED ORDER — CYANOCOBALAMIN 1000 MCG/ML IJ SOLN
1000.0000 ug | Freq: Once | INTRAMUSCULAR | Status: AC
  Administered 2018-11-08: 1000 ug via INTRAMUSCULAR

## 2018-11-08 NOTE — Progress Notes (Addendum)
Pre visit review using our clinic review tool, if applicable. No additional management support is needed unless otherwise documented below in the visit note.  Pt here today for B12 injection. Cyanocobalamin 71mL injected into R deltoid. Pt tolerated injection well. Next in 4 weeks.   Kathlene November, MD

## 2018-11-22 ENCOUNTER — Other Ambulatory Visit: Payer: Self-pay

## 2018-11-22 MED ORDER — LOSARTAN POTASSIUM 50 MG PO TABS: 50.0000 mg | ORAL_TABLET | Freq: Every day | ORAL | 0 refills | Status: DC

## 2018-12-05 ENCOUNTER — Ambulatory Visit (INDEPENDENT_AMBULATORY_CARE_PROVIDER_SITE_OTHER): Payer: PPO | Admitting: Cardiology

## 2018-12-05 ENCOUNTER — Other Ambulatory Visit: Payer: Self-pay

## 2018-12-05 ENCOUNTER — Encounter: Payer: Self-pay | Admitting: Cardiology

## 2018-12-05 VITALS — BP 118/66 | HR 66 | Ht 65.5 in | Wt 141.8 lb

## 2018-12-05 DIAGNOSIS — I441 Atrioventricular block, second degree: Secondary | ICD-10-CM | POA: Diagnosis not present

## 2018-12-05 LAB — CUP PACEART INCLINIC DEVICE CHECK
Battery Remaining Longevity: 93 mo
Battery Voltage: 3.02 V
Brady Statistic AP VP Percent: 0.01 %
Brady Statistic AP VS Percent: 6.29 %
Brady Statistic AS VP Percent: 0.88 %
Brady Statistic AS VS Percent: 92.82 %
Brady Statistic RA Percent Paced: 6.3 %
Brady Statistic RV Percent Paced: 0.95 %
Date Time Interrogation Session: 20200921151947
Implantable Lead Implant Date: 20171208
Implantable Lead Implant Date: 20171208
Implantable Lead Location: 753859
Implantable Lead Location: 753860
Implantable Lead Model: 5076
Implantable Lead Model: 5076
Implantable Pulse Generator Implant Date: 20171208
Lead Channel Impedance Value: 513 Ohm
Lead Channel Impedance Value: 513 Ohm
Lead Channel Impedance Value: 589 Ohm
Lead Channel Impedance Value: 665 Ohm
Lead Channel Pacing Threshold Amplitude: 0.5 V
Lead Channel Pacing Threshold Amplitude: 0.75 V
Lead Channel Pacing Threshold Pulse Width: 0.4 ms
Lead Channel Pacing Threshold Pulse Width: 0.4 ms
Lead Channel Sensing Intrinsic Amplitude: 24.375 mV
Lead Channel Sensing Intrinsic Amplitude: 3.625 mV
Lead Channel Setting Pacing Amplitude: 2 V
Lead Channel Setting Pacing Amplitude: 2.5 V
Lead Channel Setting Pacing Pulse Width: 0.4 ms
Lead Channel Setting Sensing Sensitivity: 4 mV

## 2018-12-05 MED ORDER — METOPROLOL SUCCINATE ER 50 MG PO TB24: 50.0000 mg | ORAL_TABLET | Freq: Every day | ORAL | 3 refills | Status: DC

## 2018-12-05 NOTE — Addendum Note (Signed)
Addended by: Stanton Kidney on: 12/05/2018 02:32 PM   Modules accepted: Orders

## 2018-12-05 NOTE — Patient Instructions (Addendum)
Medication Instructions:  Your physician has recommended you make the following change in your medication:  1. STOP Lopressor (Metoprolol Tartrate) 2. START Toprol (Metoprolol Succinate) 50 mg ONCE daily at bedtime.  * If you need a refill on your cardiac medications before your next appointment, please call your pharmacy.   Labwork: None ordered  Testing/Procedures: None ordered  Follow-Up: Remote monitoring is used to monitor your Pacemaker. This monitoring reduces the number of office visits required to check your device to one time per year. It allows [place] to keep an eye on the functioning of your device to ensure it is working properly. You are scheduled for a device check from home on 03/09/19. You may send your transmission at any time that day. If you have a wireless device, the transmission will be sent automatically. After your physician reviews your transmission, you will receive a postcard with your next transmission date.  Your physician wants you to follow-up in: 6 months with Dr. Curt Bears.  You will receive a reminder letter in the mail two months in advance. If you don't receive a letter, please call our office to schedule the follow-up appointment.  Thank you for choosing [facility]!!   Trinidad Curet, RN [phone]  Any Other Special Instructions Will Be Listed Below (If Applicable).  Metoprolol extended-release tablets What is this medicine? METOPROLOL (me TOE proe lole) is a beta-blocker. Beta-blockers reduce the workload on the heart and help it to beat more regularly. This medicine is used to treat high blood pressure and to prevent chest pain. It is also used to after a heart attack and to prevent an additional heart attack from occurring. This medicine may be used for other purposes; ask your health care provider or pharmacist if you have questions. COMMON BRAND NAME(S): toprol, Toprol XL What should I tell my health care provider before I take this medicine?  They need to know if you have any of these conditions:  diabetes  heart or vessel disease like slow heart rate, worsening heart failure, heart block, sick sinus syndrome or Raynaud's disease  kidney disease  liver disease  lung or breathing disease, like asthma or emphysema  pheochromocytoma  thyroid disease  an unusual or allergic reaction to metoprolol, other beta-blockers, medicines, foods, dyes, or preservatives  pregnant or trying to get pregnant  breast-feeding How should I use this medicine? Take this medicine by mouth with a glass of water. Follow the directions on the prescription label. Do not crush or chew. Take this medicine with or immediately after meals. Take your doses at regular intervals. Do not take more medicine than directed. Do not stop taking this medicine suddenly. This could lead to serious heart-related effects. Talk to your pediatrician regarding the use of this medicine in children. While this drug may be prescribed for children as young as 6 years for selected conditions, precautions do apply. Overdosage: If you think you have taken too much of this medicine contact a poison control center or emergency room at once. NOTE: This medicine is only for you. Do not share this medicine with others. What if I miss a dose? If you miss a dose, take it as soon as you can. If it is almost time for your next dose, take only that dose. Do not take double or extra doses. What may interact with this medicine? This medicine may interact with the following medications:  certain medicines for blood pressure, heart disease, irregular heart beat  certain medicines for depression, like  monoamine oxidase (MAO) inhibitors, fluoxetine, or paroxetine  clonidine  dobutamine  epinephrine  isoproterenol  reserpine This list may not describe all possible interactions. Give your health care provider a list of all the medicines, herbs, non-prescription drugs, or dietary  supplements you use. Also tell them if you smoke, drink alcohol, or use illegal drugs. Some items may interact with your medicine. What should I watch for while using this medicine? Visit your doctor or health care professional for regular check ups. Contact your doctor right away if your symptoms worsen. Check your blood pressure and pulse rate regularly. Ask your health care professional what your blood pressure and pulse rate should be, and when you should contact them. You may get drowsy or dizzy. Do not drive, use machinery, or do anything that needs mental alertness until you know how this medicine affects you. Do not sit or stand up quickly, especially if you are an older patient. This reduces the risk of dizzy or fainting spells. Contact your doctor if these symptoms continue. Alcohol may interfere with the effect of this medicine. Avoid alcoholic drinks. This medicine may increase blood sugar. Ask your healthcare provider if changes in diet or medicines are needed if you have diabetes. What side effects may I notice from receiving this medicine? Side effects that you should report to your doctor or health care professional as soon as possible:  allergic reactions like skin rash, itching or hives  cold or numb hands or feet  depression  difficulty breathing  faint  fever with sore throat  irregular heartbeat, chest pain  rapid weight gain   signs and symptoms of high blood sugar such as being more thirsty or hungry or having to urinate more than normal. You may also feel very tired or have blurry vision.  swollen legs or ankles Side effects that usually do not require medical attention (report to your doctor or health care professional if they continue or are bothersome):  anxiety or nervousness  change in sex drive or performance  dry skin  headache  nightmares or trouble sleeping  short term memory loss  stomach upset or diarrhea This list may not describe all  possible side effects. Call your doctor for medical advice about side effects. You may report side effects to FDA at 1-800-FDA-1088. Where should I keep my medicine? Keep out of the reach of children. Store at room temperature between 15 and 30 degrees C (59 and 86 degrees F). Throw away any unused medicine after the expiration date. NOTE: This sheet is a summary. It may not cover all possible information. If you have questions about this medicine, talk to your doctor, pharmacist, or health care provider.  2020 Elsevier/Gold Standard (2017-12-21 11:09:41)

## 2018-12-05 NOTE — Progress Notes (Signed)
Electrophysiology Office Note   Date:  12/05/2018   ID:  Mandy Agda, Durham 12-01-1945, MRN [id number]  PCP:  Colon Branch, MD  Cardiologist:  Aundra Dubin Primary Electrophysiologist:  Will Meredith Leeds, MD    No chief complaint on file.    History of Present Illness: Mandy Durham is a 74 y.o. female who presents today for electrophysiology evaluation.   Presented to the hospital in 3:1 AV block. Metoprolol washed out with continued block and the patient received a Medtronic dual chamber pacemaker.  Today, denies symptoms of palpitations, chest pain, shortness of breath, orthopnea, PND, lower extremity edema, claudication, dizziness, presyncope, syncope, bleeding, or neurologic sequela. The patient is tolerating medications without difficulties.  She is overall doing well.  She has no chest pain.  She does get a little short of breath when she does exertional activity, but this is improved with rest.  She otherwise has no major complaints other than some fatigue.   Past Medical History:  Diagnosis Date  . Anxiety state, unspecified   . Hyperlipidemia   . Hypertension   . Insomnia   . LBBB (left bundle branch block)    LHC in 2002 showed normal coronaries.   . Osteoarthrosis, unspecified whether generalized or localized, unspecified site   . Overweight(278.02)   . S/P placement of cardiac pacemaker    a. 02/21/16: Medtronic Advisa DR MRI SureScan model [id number] (serial number [id number])   . Serrated adenoma of colon 2007  . Symptomatic menopausal or female climacteric states   . Syncope and collapse    11/12: Holter 12/12 with rare PACS, HR range 64-140, average 82, no significant arrhythmias. Echo (1/13): EF 50-55%, mild LVH, septal-lateral dyssynchrony c/w LBBB. 3-week event monitor (1/13): No significant arrhythmia.    Past Surgical History:  Procedure Laterality Date  . COLONOSCOPY  2007  . EP IMPLANTABLE DEVICE N/A 02/21/2016   Procedure: Pacemaker Implant;  Surgeon: Will  Meredith Leeds, MD;  Location: [facility];  Service: Cardiovascular;  Laterality: N/A;  . POLYPECTOMY       Current Outpatient Medications  Medication Sig Dispense Refill  . acetaminophen (TYLENOL) 500 MG tablet Take 500 mg by mouth every 6 (six) hours as needed.    . carbidopa-levodopa (SINEMET IR) 25-100 MG tablet Take 1 tablet by mouth 3 (three) times daily. 270 tablet 3  . Cholecalciferol (VITAMIN D PO) Take 1 tablet by mouth 2 (two) times a week. Vitamin D    . ezetimibe (ZETIA) 10 MG tablet Take 1 tablet (10 mg total) by mouth daily. 90 tablet 3  . ibandronate (BONIVA) 150 MG tablet ibandronate 150 mg tablet  TAKE 1 TABLET BY MOUTH ONCE EVERY MONTH IN THE MORNING FOR 30 DAYS    . losartan (COZAAR) 50 MG tablet Take 1 tablet (50 mg total) by mouth daily. 90 tablet 0  . metoprolol tartrate (LOPRESSOR) 25 MG tablet Take 1 tablet (25 mg total) by mouth 2 (two) times daily. Please hold until patient request refill 180 tablet 3   No current facility-administered medications for this visit.     Allergies:   Patient has no known allergies.   Social History:  The patient  reports that she has never smoked. She has never used smokeless tobacco. She reports that she does not drink alcohol or use drugs.   Family History:  The patient's family history includes Coronary artery disease in her mother; Dementia in her mother; Diabetes in her mother; Heart failure  in her mother.    ROS:  Please see the history of present illness.   Otherwise, review of systems is positive for none.   All other systems are reviewed and negative.   PHYSICAL EXAM: VS:  BP 118/66   Pulse 66   Ht 5' 5.5" (1.664 m)   Wt 141 lb 12.8 oz (64.3 kg)   SpO2 98%   BMI 23.24 kg/m  , BMI Body mass index is 23.24 kg/m. GEN: Well nourished, well developed, in no acute distress  HEENT: normal  Neck: no JVD, carotid bruits, or masses Cardiac: RRR; no murmurs, rubs, or gallops,no edema  Respiratory:  clear to  auscultation bilaterally, normal work of breathing GI: soft, nontender, nondistended, + BS MS: no deformity or atrophy  Skin: warm and dry, device site well healed Neuro:  Strength and sensation are intact Psych: euthymic mood, full affect  EKG:  EKG is ordered today. Personal review of the ekg ordered shows SR, LBBB  Personal review of the device interrogation today. Results in [facility] [place]: 08/09/2018: ALT 18; Hemoglobin 14.0; Platelets 317.0 11/02/2018: BUN 15; Creatinine, Ser 0.84; Potassium 4.5; Sodium 139; TSH 1.27    Lipid Panel     Component Value Date/Time   CHOL 237 (H) 08/16/2017 0848   TRIG 91.0 08/16/2017 0848   HDL 104.40 08/16/2017 0848   CHOLHDL 2 08/16/2017 0848   VLDL 18.2 08/16/2017 0848   LDLCALC 114 (H) 08/16/2017 0848   LDLDIRECT 106.9 11/03/2012 0913     Wt Readings from Last 3 Encounters:  12/05/18 141 lb 12.8 oz (64.3 kg)  10/18/18 138 lb (62.6 kg)  09/06/18 137 lb 8 oz (62.4 kg)      Other studies Reviewed: Additional studies/ records that were reviewed today include: TTE 07/17/15  Review of the above records today demonstrates:  - Left ventricle: The cavity size was normal. There was severe   focal basal hypertrophy of the septum. Systolic function was   normal. The estimated ejection fraction was in the range of 50%   to 55%. Anteroseptal hypokinesis. Doppler parameters are   consistent with abnormal left ventricular relaxation (grade 1   diastolic dysfunction). The E/e&' ratio is between 8-15,   suggesting indeterminate LV filling pressure. - Mitral valve: Mildly thickened leaflets . There was trivial   regurgitation. - Left atrium: The atrium was normal in size. - Tricuspid valve: There was mild regurgitation. - Pulmonary arteries: PA peak pressure: 32 mm Hg (S). - Inferior vena cava: The vessel was normal in size. The   respirophasic diameter changes were in the normal range (>= 50%),   consistent with normal central venous  pressure.   ASSESSMENT AND PLAN:  1.  3:1 AV block: Status post Medtronic dual-chamber pacemaker implanted 02/21/2016.  Device functioning appropriately.  She is having some weakness and fatigue.  We will thus stop her metoprolol and start her on Toprol-XL 50 mg to take at night.  This may also help with her shortness of breath.  2. Hypertension: well controlled  3. Hyperlipidemia: Continue Zetia Per primary cardiology    Current medicines are reviewed at length with the patient today.   The patient does not have concerns regarding her medicines.  The following changes were made today: Stop metoprolol, start Toprol-XL  Labs/ tests ordered today include:  Orders Placed This Encounter  Procedures  . EKG 12-Lead     Disposition:   FU with Will Camnitz 6 months  Signed, Will Hassell Done  Curt Bears, MD  12/05/2018 2:26 PM     [address] [place] [address] [phone] (office) [phone] (fax)

## 2018-12-08 ENCOUNTER — Ambulatory Visit (INDEPENDENT_AMBULATORY_CARE_PROVIDER_SITE_OTHER): Payer: PPO | Admitting: *Deleted

## 2018-12-08 DIAGNOSIS — I441 Atrioventricular block, second degree: Secondary | ICD-10-CM | POA: Diagnosis not present

## 2018-12-09 ENCOUNTER — Ambulatory Visit: Payer: PPO

## 2018-12-09 LAB — CUP PACEART REMOTE DEVICE CHECK
Battery Remaining Longevity: 92 mo
Battery Voltage: 3.02 V
Brady Statistic AP VP Percent: 0.04 %
Brady Statistic AP VS Percent: 8.29 %
Brady Statistic AS VP Percent: 19.61 %
Brady Statistic AS VS Percent: 72.05 %
Brady Statistic RA Percent Paced: 8.33 %
Brady Statistic RV Percent Paced: 19.71 %
Date Time Interrogation Session: 20200925142621
Implantable Lead Implant Date: 20171208
Implantable Lead Implant Date: 20171208
Implantable Lead Location: 753859
Implantable Lead Location: 753860
Implantable Lead Model: 5076
Implantable Lead Model: 5076
Implantable Pulse Generator Implant Date: 20171208
Lead Channel Impedance Value: 380 Ohm
Lead Channel Impedance Value: 475 Ohm
Lead Channel Impedance Value: 532 Ohm
Lead Channel Impedance Value: 532 Ohm
Lead Channel Pacing Threshold Amplitude: 0.5 V
Lead Channel Pacing Threshold Amplitude: 0.625 V
Lead Channel Pacing Threshold Pulse Width: 0.4 ms
Lead Channel Pacing Threshold Pulse Width: 0.4 ms
Lead Channel Sensing Intrinsic Amplitude: 1.375 mV
Lead Channel Sensing Intrinsic Amplitude: 1.375 mV
Lead Channel Sensing Intrinsic Amplitude: 20.875 mV
Lead Channel Sensing Intrinsic Amplitude: 20.875 mV
Lead Channel Setting Pacing Amplitude: 2 V
Lead Channel Setting Pacing Amplitude: 2.5 V
Lead Channel Setting Pacing Pulse Width: 0.4 ms
Lead Channel Setting Sensing Sensitivity: 4 mV

## 2018-12-14 ENCOUNTER — Telehealth: Payer: Self-pay | Admitting: Cardiology

## 2018-12-14 ENCOUNTER — Other Ambulatory Visit: Payer: Self-pay

## 2018-12-14 ENCOUNTER — Ambulatory Visit (INDEPENDENT_AMBULATORY_CARE_PROVIDER_SITE_OTHER): Payer: PPO | Admitting: *Deleted

## 2018-12-14 DIAGNOSIS — Z23 Encounter for immunization: Secondary | ICD-10-CM

## 2018-12-14 DIAGNOSIS — E538 Deficiency of other specified B group vitamins: Secondary | ICD-10-CM

## 2018-12-14 MED ORDER — CYANOCOBALAMIN 1000 MCG/ML IJ SOLN
1000.0000 ug | Freq: Once | INTRAMUSCULAR | Status: AC
  Administered 2018-12-14: 1000 ug via INTRAMUSCULAR

## 2018-12-14 NOTE — Progress Notes (Signed)
Pt here today for B12 injection per Dr. Larose Kells and high dose flu vaccine   Cyanocobalamin 44mL injected into left deltoid and flu vaccine given in right deltoid.  Patient tolerated both injections well.

## 2018-12-14 NOTE — Telephone Encounter (Signed)
° ° °  Pt c/o medication issue:  1. Name of Medication: metoprolol  2. How are you currently taking this medication (dosage and times per day)? As written  3. Are you having a reaction (difficulty breathing--STAT)? Trouble sleeping since started metoprolol  4. What is your medication issue? Can medication dosage/ time be changed

## 2018-12-15 NOTE — Telephone Encounter (Signed)
Pt reports having trouble sleeping since switching to Toprol.  She takes this at night as recommended. Pt did not take her dose last night and instead took Lopressor dose (she still had some Lopressor from before).  Pt has not taken any Metoprolol this morning. Pt advised to take her Toprol this morning and start taking it in the morning to see if this makes a difference in getting sleep.  Advised to call/send my chart message next week and let us know if this made a difference.  Patient verbalized understanding and agreeable to plan.

## 2018-12-19 ENCOUNTER — Encounter: Payer: Self-pay | Admitting: Cardiology

## 2018-12-19 NOTE — Progress Notes (Signed)
Remote pacemaker transmission.   

## 2018-12-22 NOTE — Telephone Encounter (Signed)
Patient was calling back to report the changes in how she takes her medication. Please call to discuss

## 2018-12-26 NOTE — Telephone Encounter (Signed)
Returned pt call. She tried taking it at bedtime and she had issues sleeping, so she want back on the Lopressor 25 mg BID she used to take. Pt would like to know if Dr. Curt Bears has another medication he recommends so that she is not so tired. Aware I will discuss with Dr. Curt Bears and let her know. Pt agreeable to plan.

## 2019-01-03 ENCOUNTER — Telehealth: Payer: Self-pay | Admitting: Cardiology

## 2019-01-03 NOTE — Telephone Encounter (Signed)
No message needed °

## 2019-01-03 NOTE — Telephone Encounter (Signed)
Follow up   Patient has questions about medication per the previous message. Please call.

## 2019-01-05 ENCOUNTER — Other Ambulatory Visit: Payer: Self-pay | Admitting: Cardiology

## 2019-01-05 DIAGNOSIS — R011 Cardiac murmur, unspecified: Secondary | ICD-10-CM

## 2019-01-05 NOTE — Telephone Encounter (Signed)
Pt advised to take Toprol in the AM per Dr. Curt Bears.  Pt states she has not tried this yet.   Pt currently back on her Lopressor.  Advised to take her Lopressor dose tonight, then stop it and re-start the Toprol 50 mg tomorrow morning. Patient verbalized understanding and agreeable to plan.  She will call the office back if this change causes trouble sleeping again.

## 2019-01-06 MED ORDER — METOPROLOL SUCCINATE ER 50 MG PO TB24: 50.0000 mg | ORAL_TABLET | Freq: Every day | ORAL | 3 refills | Status: DC

## 2019-01-09 ENCOUNTER — Encounter: Payer: Self-pay | Admitting: Internal Medicine

## 2019-01-13 ENCOUNTER — Other Ambulatory Visit: Payer: Self-pay

## 2019-01-13 ENCOUNTER — Ambulatory Visit (INDEPENDENT_AMBULATORY_CARE_PROVIDER_SITE_OTHER): Payer: PPO | Admitting: *Deleted

## 2019-01-13 DIAGNOSIS — E538 Deficiency of other specified B group vitamins: Secondary | ICD-10-CM

## 2019-01-13 MED ORDER — CYANOCOBALAMIN 1000 MCG/ML IJ SOLN
1000.0000 ug | Freq: Once | INTRAMUSCULAR | Status: AC
  Administered 2019-01-13: 1000 ug via INTRAMUSCULAR

## 2019-01-13 NOTE — Progress Notes (Signed)
Pt here today for B12 injection per Dr. Larose Kells.   Cyanocobalamin 57mL injected into right deltoid and patient tolerated well.   Patient has appointment with Dr. Larose Kells on 02/20/19 and will discuss about checking level and getting next injection at that time.

## 2019-02-14 ENCOUNTER — Ambulatory Visit: Payer: PPO

## 2019-02-20 ENCOUNTER — Encounter: Payer: PPO | Admitting: Internal Medicine

## 2019-03-09 ENCOUNTER — Ambulatory Visit (INDEPENDENT_AMBULATORY_CARE_PROVIDER_SITE_OTHER): Payer: PPO | Admitting: *Deleted

## 2019-03-09 DIAGNOSIS — I441 Atrioventricular block, second degree: Secondary | ICD-10-CM | POA: Diagnosis not present

## 2019-03-10 LAB — CUP PACEART REMOTE DEVICE CHECK
Battery Remaining Longevity: 91 mo
Battery Voltage: 3.02 V
Brady Statistic AP VP Percent: 2.16 %
Brady Statistic AP VS Percent: 5.03 %
Brady Statistic AS VP Percent: 34.54 %
Brady Statistic AS VS Percent: 58.28 %
Brady Statistic RA Percent Paced: 7.18 %
Brady Statistic RV Percent Paced: 36.76 %
Date Time Interrogation Session: 20201224140618
Implantable Lead Implant Date: 20171208
Implantable Lead Implant Date: 20171208
Implantable Lead Location: 753859
Implantable Lead Location: 753860
Implantable Lead Model: 5076
Implantable Lead Model: 5076
Implantable Pulse Generator Implant Date: 20171208
Lead Channel Impedance Value: 475 Ohm
Lead Channel Impedance Value: 513 Ohm
Lead Channel Impedance Value: 570 Ohm
Lead Channel Impedance Value: 608 Ohm
Lead Channel Pacing Threshold Amplitude: 0.5 V
Lead Channel Pacing Threshold Amplitude: 0.625 V
Lead Channel Pacing Threshold Pulse Width: 0.4 ms
Lead Channel Pacing Threshold Pulse Width: 0.4 ms
Lead Channel Sensing Intrinsic Amplitude: 2.25 mV
Lead Channel Sensing Intrinsic Amplitude: 2.25 mV
Lead Channel Sensing Intrinsic Amplitude: 23.875 mV
Lead Channel Sensing Intrinsic Amplitude: 23.875 mV
Lead Channel Setting Pacing Amplitude: 2 V
Lead Channel Setting Pacing Amplitude: 2.5 V
Lead Channel Setting Pacing Pulse Width: 0.4 ms
Lead Channel Setting Sensing Sensitivity: 4 mV

## 2019-03-14 DIAGNOSIS — H2513 Age-related nuclear cataract, bilateral: Secondary | ICD-10-CM | POA: Diagnosis not present

## 2019-03-17 ENCOUNTER — Other Ambulatory Visit: Payer: Self-pay | Admitting: Internal Medicine

## 2019-05-06 ENCOUNTER — Other Ambulatory Visit: Payer: Self-pay | Admitting: Internal Medicine

## 2019-05-11 ENCOUNTER — Telehealth: Payer: Self-pay | Admitting: Internal Medicine

## 2019-05-11 NOTE — Progress Notes (Signed)
  Chronic Care Management   Outreach Note  05/11/2019 Name: Mandy Durham MRN: [id number] DOB: 1946-01-24  Referred by: Colon Branch, MD Reason for referral : No chief complaint on file.   An unsuccessful telephone outreach was attempted today. The patient was referred to the pharmacist for assistance with care management and care coordination.   Follow Up Plan:   Raynicia Dukes [employer] Scheduler

## 2019-05-25 ENCOUNTER — Telehealth: Payer: Self-pay | Admitting: Internal Medicine

## 2019-05-25 NOTE — Chronic Care Management (AMB) (Signed)
  Chronic Care Management   Note  05/25/2019 Name: Mandy Durham MRN: [id number] DOB: April 10, 1945  Mandy Durham is a 74 y.o. year old female who is a primary care patient of Colon Branch, MD. I reached out to Mandy Durham by phone today in response to a referral sent by Mandy Durham PCP, Colon Branch, MD.   Mandy Durham was given information about Chronic Care Management services today including:  1. CCM service includes personalized support from designated clinical staff supervised by her physician, including individualized plan of care and coordination with other care providers 2. 24/7 contact phone numbers for assistance for urgent and routine care needs. 3. Service will only be billed when office clinical staff spend 20 minutes or more in a month to coordinate care. 4. Only one practitioner may furnish and bill the service in a calendar month. 5. The patient may stop CCM services at any time (effective at the end of the month) by phone call to the office staff.   Patient agreed to services and verbal consent obtained.   Follow up plan:   Mandy Durham [employer] Scheduler

## 2019-05-25 NOTE — Progress Notes (Signed)
  Chronic Care Management   Outreach Note  05/25/2019 Name: Mandy Durham MRN: [id number] DOB: 09-Aug-1945  Referred by: Colon Branch, MD Reason for referral : No chief complaint on file.   A second unsuccessful telephone outreach was attempted today. The patient was referred to pharmacist for assistance with care management and care coordination.  Follow Up Plan:   Raynicia Dukes [employer] Scheduler

## 2019-05-30 ENCOUNTER — Telehealth: Payer: Self-pay | Admitting: Internal Medicine

## 2019-05-30 NOTE — Telephone Encounter (Signed)
Error

## 2019-06-06 ENCOUNTER — Encounter: Payer: Self-pay | Admitting: Cardiology

## 2019-06-06 ENCOUNTER — Ambulatory Visit: Payer: PPO | Admitting: Cardiology

## 2019-06-06 ENCOUNTER — Other Ambulatory Visit: Payer: Self-pay

## 2019-06-06 VITALS — BP 130/76 | HR 71 | Ht 65.5 in | Wt 151.0 lb

## 2019-06-06 DIAGNOSIS — I441 Atrioventricular block, second degree: Secondary | ICD-10-CM

## 2019-06-06 NOTE — Progress Notes (Signed)
Electrophysiology Office Note   Date:  06/06/2019   ID:  Mandy Durham, Mandy Durham 24-Feb-1946, MRN [id number]  PCP:  Colon Branch, MD  Cardiologist:  Aundra Dubin Primary Electrophysiologist:  Arissa Fagin Mandy Leeds, MD    No chief complaint on file.    History of Present Illness: Mandy Durham is a 74 y.o. female who presents today for electrophysiology evaluation.   She presented to the hospital in 3-1 AV block.  She was on metoprolol which was allowed to washout.  She is now status post Medtronic dual-chamber pacemaker implanted 02/21/2016.    Today, denies symptoms of palpitations, chest pain, shortness of breath, orthopnea, PND, lower extremity edema, claudication, dizziness, presyncope, syncope, bleeding, or neurologic sequela. The patient is tolerating medications without difficulties.  Unfortunately, she is continued to have episodes of fatigue.  She says that when she wakes up in the morning she feels well but quickly gets fatigued and feels like her legs are quite heavy.  This has been happening for the last few months.  Past Medical History:  Diagnosis Date  . Anxiety state, unspecified   . Hyperlipidemia   . Hypertension   . Insomnia   . LBBB (left bundle branch block)    LHC in 2002 showed normal coronaries.   . Osteoarthrosis, unspecified whether generalized or localized, unspecified site   . Overweight(278.02)   . S/P placement of cardiac pacemaker    a. 02/21/16: Medtronic Advisa DR MRI SureScan model [id number] (serial number [id number])   . Serrated adenoma of colon 2007  . Symptomatic menopausal or female climacteric states   . Syncope and collapse    11/12: Holter 12/12 with rare PACS, HR range 64-140, average 82, no significant arrhythmias. Echo (1/13): EF 50-55%, mild LVH, septal-lateral dyssynchrony c/w LBBB. 3-week event monitor (1/13): No significant arrhythmia.    Past Surgical History:  Procedure Laterality Date  . COLONOSCOPY  2007  . EP IMPLANTABLE DEVICE N/A  02/21/2016   Procedure: Pacemaker Implant;  Surgeon: Kay Ricciuti Mandy Leeds, MD;  Location: [facility];  Service: Cardiovascular;  Laterality: N/A;  . POLYPECTOMY       Current Outpatient Medications  Medication Sig Dispense Refill  . acetaminophen (TYLENOL) 500 MG tablet Take 500 mg by mouth every 6 (six) hours as needed.    . carbidopa-levodopa (SINEMET IR) 25-100 MG tablet Take 1 tablet by mouth 3 (three) times daily. 270 tablet 3  . Cholecalciferol (VITAMIN D PO) Take 1 tablet by mouth 2 (two) times a week. Vitamin D    . ezetimibe (ZETIA) 10 MG tablet Take 1 tablet (10 mg total) by mouth daily. 90 tablet 3  . ibandronate (BONIVA) 150 MG tablet ibandronate 150 mg tablet  TAKE 1 TABLET BY MOUTH ONCE EVERY MONTH IN THE MORNING FOR 30 DAYS    . losartan (COZAAR) 50 MG tablet Take 1 tablet by mouth once daily 90 tablet 2  . metoprolol succinate (TOPROL-XL) 50 MG 24 hr tablet Take 1 tablet (50 mg total) by mouth daily. Take with or immediately following a meal. 90 tablet 3   No current facility-administered medications for this visit.    Allergies:   Patient has no known allergies.   Social History:  The patient  reports that she has never smoked. She has never used smokeless tobacco. She reports that she does not drink alcohol or use drugs.   Family History:  The patient's family history includes Coronary artery disease in her mother; Dementia in  her mother; Diabetes in her mother; Heart failure in her mother.   ROS:  Please see the history of present illness.   Otherwise, review of systems is positive for none.   All other systems are reviewed and negative.   PHYSICAL EXAM: VS:  BP 130/76   Pulse 71   Ht 5' 5.5" (1.664 m)   Wt 151 lb (68.5 kg)   SpO2 97%   BMI 24.75 kg/m  , BMI Body mass index is 24.75 kg/m. GEN: Well nourished, well developed, in no acute distress  HEENT: normal  Neck: no JVD, carotid bruits, or masses Cardiac: RRR; 1 out of 6 systolic murmur at the  base, no rubs, or gallops,no edema  Respiratory:  clear to auscultation bilaterally, normal work of breathing GI: soft, nontender, nondistended, + BS MS: no deformity or atrophy  Skin: warm and dry, device site well healed Neuro:  Strength and sensation are intact Psych: euthymic mood, full affect  EKG:  EKG is ordered today. Personal review of the ekg ordered shows sinus rhythm, left bundle branch block  Personal review of the device interrogation today. Results in [facility]: 08/09/2018: ALT 18; Hemoglobin 14.0; Platelets 317.0 11/02/2018: BUN 15; Creatinine, Ser 0.84; Potassium 4.5; Sodium 139; TSH 1.27    Lipid Panel     Component Value Date/Time   CHOL 237 (H) 08/16/2017 0848   TRIG 91.0 08/16/2017 0848   HDL 104.40 08/16/2017 0848   CHOLHDL 2 08/16/2017 0848   VLDL 18.2 08/16/2017 0848   LDLCALC 114 (H) 08/16/2017 0848   LDLDIRECT 106.9 11/03/2012 0913     Wt Readings from Last 3 Encounters:  06/06/19 151 lb (68.5 kg)  12/05/18 141 lb 12.8 oz (64.3 kg)  10/18/18 138 lb (62.6 kg)      Other studies Reviewed: Additional studies/ records that were reviewed today include: TTE 07/17/15  Review of the above records today demonstrates:  - Left ventricle: The cavity size was normal. There was severe   focal basal hypertrophy of the septum. Systolic function was   normal. The estimated ejection fraction was in the range of 50%   to 55%. Anteroseptal hypokinesis. Doppler parameters are   consistent with abnormal left ventricular relaxation (grade 1   diastolic dysfunction). The E/e&' ratio is between 8-15,   suggesting indeterminate LV filling pressure. - Mitral valve: Mildly thickened leaflets . There was trivial   regurgitation. - Left atrium: The atrium was normal in size. - Tricuspid valve: There was mild regurgitation. - Pulmonary arteries: PA peak pressure: 32 mm Hg (S). - Inferior vena cava: The vessel was normal in size. The   respirophasic diameter  changes were in the normal range (>= 50%),   consistent with normal central venous pressure.   ASSESSMENT AND PLAN:  1.  3:1 AV block: Status post Medtronic dual-chamber pacemaker implanted 02/21/2016.  Device functioning appropriately.  No changes at this time.    2. Hypertension: Currently well controlled  3. Hyperlipidemia: Continue Zetia per primary cardiology  4.  Fatigue: Unclear as to the cause of her fatigue.  She has had a fairly normal echo, but this was in 2017.  We Heela Heishman stop her Toprol-XL to see if this makes a difference.  If it does not, she may need a recheck of her ejection fraction.  Current medicines are reviewed at length with the patient today.   The patient does not have concerns regarding her medicines.  The following changes were made today: Stop  Toprol-XL  Labs/ tests ordered today include:  Orders Placed This Encounter  Procedures  . EKG 12-Lead  . EKG 12-Lead     Disposition:   FU with Wilberta Dorvil 6 months  Signed, Terald Jump Mandy Leeds, MD  06/06/2019 12:01 PM     [address] [place] [address] [phone] (office) [phone] (fax)

## 2019-06-06 NOTE — Patient Instructions (Signed)
Medication Instructions:  Your physician has recommended you make the following change in your medication:  1. STOP Metoprolol  *If you need a refill on your cardiac medications before your next appointment, please call your pharmacy*   Lab Work: None ordered   Testing/Procedures: None ordered   Follow-Up: At [facility], you and your health needs are our priority.  As part of our continuing mission to provide you with exceptional heart care, we have created designated Provider Care Teams.  These Care Teams include your primary Cardiologist (physician) and Advanced Practice Providers (APPs -  Physician Assistants and Nurse Practitioners) who all work together to provide you with the care you need, when you need it.  We recommend signing up for the patient portal called "MyChart".  Sign up information is provided on this After Visit Summary.  MyChart is used to connect with patients for Virtual Visits (Telemedicine).  Patients are able to view lab/test results, encounter notes, upcoming appointments, etc.  Non-urgent messages can be sent to your provider as well.   To learn more about what you can do with MyChart, go to [email or web address].    Your next appointment:   6 month(s)  The format for your next appointment:   In Person  Provider:   Allegra Lai, MD   Thank you for choosing [facility]!!   Trinidad Curet, RN [phone]    Other Instructions  Please call the office in several weeks and let us know how you are doing off the Metoprolol.

## 2019-06-06 NOTE — Addendum Note (Signed)
Addended by: Stanton Kidney on: 06/06/2019 01:34 PM   Modules accepted: Orders

## 2019-06-07 DIAGNOSIS — K5900 Constipation, unspecified: Secondary | ICD-10-CM | POA: Diagnosis not present

## 2019-06-08 ENCOUNTER — Ambulatory Visit (INDEPENDENT_AMBULATORY_CARE_PROVIDER_SITE_OTHER): Payer: PPO | Admitting: *Deleted

## 2019-06-08 DIAGNOSIS — I441 Atrioventricular block, second degree: Secondary | ICD-10-CM

## 2019-06-09 LAB — CUP PACEART REMOTE DEVICE CHECK
Battery Remaining Longevity: 91 mo
Battery Voltage: 3.02 V
Brady Statistic AP VP Percent: 0.02 %
Brady Statistic AP VS Percent: 1.98 %
Brady Statistic AS VP Percent: 5.44 %
Brady Statistic AS VS Percent: 92.55 %
Brady Statistic RA Percent Paced: 2 %
Brady Statistic RV Percent Paced: 5.46 %
Date Time Interrogation Session: 20210325201531
Implantable Lead Implant Date: 20171208
Implantable Lead Implant Date: 20171208
Implantable Lead Location: 753859
Implantable Lead Location: 753860
Implantable Lead Model: 5076
Implantable Lead Model: 5076
Implantable Pulse Generator Implant Date: 20171208
Lead Channel Impedance Value: 418 Ohm
Lead Channel Impedance Value: 513 Ohm
Lead Channel Impedance Value: 532 Ohm
Lead Channel Impedance Value: 570 Ohm
Lead Channel Pacing Threshold Amplitude: 0.375 V
Lead Channel Pacing Threshold Amplitude: 0.625 V
Lead Channel Pacing Threshold Pulse Width: 0.4 ms
Lead Channel Pacing Threshold Pulse Width: 0.4 ms
Lead Channel Sensing Intrinsic Amplitude: 2.25 mV
Lead Channel Sensing Intrinsic Amplitude: 2.25 mV
Lead Channel Sensing Intrinsic Amplitude: 22.75 mV
Lead Channel Sensing Intrinsic Amplitude: 22.75 mV
Lead Channel Setting Pacing Amplitude: 2 V
Lead Channel Setting Pacing Amplitude: 2.5 V
Lead Channel Setting Pacing Pulse Width: 0.4 ms
Lead Channel Setting Sensing Sensitivity: 4 mV

## 2019-06-14 ENCOUNTER — Telehealth: Payer: PPO

## 2019-06-20 ENCOUNTER — Encounter: Payer: PPO | Admitting: Cardiology

## 2019-06-26 ENCOUNTER — Telehealth: Payer: PPO

## 2019-07-21 ENCOUNTER — Telehealth: Payer: Self-pay | Admitting: Cardiology

## 2019-07-21 NOTE — Telephone Encounter (Signed)
Advised pt to follow up w/ PCP about fatigue. Informed that Metoprolol was stopped in March and she would not still be experiencing fatigue if r/t BB therapy.  Asked pt to call back if PCP felt cardiology f/u needed after their evaluation of fatigue. Patient verbalized understanding and agreeable to plan.

## 2019-07-21 NOTE — Telephone Encounter (Signed)
New Message  Pt called and stated that even though Dr. Curt Bears took her off that medication when she came around a month or so ago, she still feels really tired. Wants a nurse to call her

## 2019-08-01 ENCOUNTER — Telehealth: Payer: Self-pay | Admitting: Internal Medicine

## 2019-08-01 NOTE — Telephone Encounter (Signed)
Caller : Mandy Durham  Call Back # [phone]  Patient is requesting labs drawn prior to office visit with Dr. Larose Kells @ [facility] . Patient is also requesting a call back from [facility].    Please Advise

## 2019-08-01 NOTE — Telephone Encounter (Signed)
Looks like she cancelled visit for tomorrow? Labs will be done at time of OV d/t her symptoms.

## 2019-08-02 ENCOUNTER — Ambulatory Visit: Payer: PPO | Admitting: Internal Medicine

## 2019-09-01 DIAGNOSIS — A049 Bacterial intestinal infection, unspecified: Secondary | ICD-10-CM | POA: Diagnosis not present

## 2019-09-01 DIAGNOSIS — R140 Abdominal distension (gaseous): Secondary | ICD-10-CM | POA: Diagnosis not present

## 2019-09-07 ENCOUNTER — Ambulatory Visit (INDEPENDENT_AMBULATORY_CARE_PROVIDER_SITE_OTHER): Payer: PPO | Admitting: *Deleted

## 2019-09-07 DIAGNOSIS — I441 Atrioventricular block, second degree: Secondary | ICD-10-CM | POA: Diagnosis not present

## 2019-09-07 LAB — CUP PACEART REMOTE DEVICE CHECK
Battery Remaining Longevity: 80 mo
Battery Voltage: 3.01 V
Brady Statistic AP VP Percent: 0 %
Brady Statistic AP VS Percent: 0.01 %
Brady Statistic AS VP Percent: 0.04 %
Brady Statistic AS VS Percent: 99.94 %
Brady Statistic RA Percent Paced: 0.01 %
Brady Statistic RV Percent Paced: 0.04 %
Date Time Interrogation Session: 20210624131744
Implantable Lead Implant Date: 20171208
Implantable Lead Implant Date: 20171208
Implantable Lead Location: 753859
Implantable Lead Location: 753860
Implantable Lead Model: 5076
Implantable Lead Model: 5076
Implantable Pulse Generator Implant Date: 20171208
Lead Channel Impedance Value: 437 Ohm
Lead Channel Impedance Value: 437 Ohm
Lead Channel Impedance Value: 494 Ohm
Lead Channel Impedance Value: 570 Ohm
Lead Channel Pacing Threshold Amplitude: 0.5 V
Lead Channel Pacing Threshold Amplitude: 0.625 V
Lead Channel Pacing Threshold Pulse Width: 0.4 ms
Lead Channel Pacing Threshold Pulse Width: 0.4 ms
Lead Channel Sensing Intrinsic Amplitude: 17.625 mV
Lead Channel Sensing Intrinsic Amplitude: 17.625 mV
Lead Channel Sensing Intrinsic Amplitude: 2.875 mV
Lead Channel Sensing Intrinsic Amplitude: 2.875 mV
Lead Channel Setting Pacing Amplitude: 2 V
Lead Channel Setting Pacing Amplitude: 2.5 V
Lead Channel Setting Pacing Pulse Width: 0.4 ms
Lead Channel Setting Sensing Sensitivity: 4 mV

## 2019-09-11 NOTE — Progress Notes (Signed)
Remote pacemaker transmission.   

## 2019-10-04 ENCOUNTER — Other Ambulatory Visit: Payer: Self-pay

## 2019-10-04 ENCOUNTER — Ambulatory Visit (INDEPENDENT_AMBULATORY_CARE_PROVIDER_SITE_OTHER): Payer: PPO | Admitting: Internal Medicine

## 2019-10-04 ENCOUNTER — Encounter: Payer: Self-pay | Admitting: Internal Medicine

## 2019-10-04 ENCOUNTER — Telehealth: Payer: Self-pay | Admitting: Cardiovascular Disease

## 2019-10-04 ENCOUNTER — Ambulatory Visit (HOSPITAL_BASED_OUTPATIENT_CLINIC_OR_DEPARTMENT_OTHER)
Admission: RE | Admit: 2019-10-04 | Discharge: 2019-10-04 | Disposition: A | Discharge status: Home / Self Care | Payer: PPO | Source: Ambulatory Visit | Attending: Internal Medicine | Admitting: Internal Medicine

## 2019-10-04 VITALS — BP 120/76 | HR 92 | Temp 97.7°F | Resp 16 | Ht 66.0 in | Wt 135.5 lb

## 2019-10-04 DIAGNOSIS — R0600 Dyspnea, unspecified: Secondary | ICD-10-CM | POA: Diagnosis not present

## 2019-10-04 DIAGNOSIS — I10 Essential (primary) hypertension: Secondary | ICD-10-CM | POA: Diagnosis not present

## 2019-10-04 DIAGNOSIS — I208 Other forms of angina pectoris: Secondary | ICD-10-CM | POA: Diagnosis not present

## 2019-10-04 DIAGNOSIS — R634 Abnormal weight loss: Secondary | ICD-10-CM

## 2019-10-04 DIAGNOSIS — M545 Low back pain, unspecified: Secondary | ICD-10-CM

## 2019-10-04 DIAGNOSIS — R0602 Shortness of breath: Secondary | ICD-10-CM | POA: Diagnosis not present

## 2019-10-04 DIAGNOSIS — M533 Sacrococcygeal disorders, not elsewhere classified: Secondary | ICD-10-CM | POA: Diagnosis not present

## 2019-10-04 DIAGNOSIS — E538 Deficiency of other specified B group vitamins: Secondary | ICD-10-CM | POA: Diagnosis not present

## 2019-10-04 DIAGNOSIS — M47818 Spondylosis without myelopathy or radiculopathy, sacral and sacrococcygeal region: Secondary | ICD-10-CM | POA: Diagnosis not present

## 2019-10-04 DIAGNOSIS — M160 Bilateral primary osteoarthritis of hip: Secondary | ICD-10-CM | POA: Diagnosis not present

## 2019-10-04 DIAGNOSIS — R0609 Other forms of dyspnea: Secondary | ICD-10-CM

## 2019-10-04 MED ORDER — ESCITALOPRAM OXALATE 5 MG PO TABS: 5.0000 mg | ORAL_TABLET | Freq: Every day | ORAL | 1 refills | Status: DC

## 2019-10-04 MED ORDER — NITROGLYCERIN 0.4 MG SL SUBL
0.4000 mg | SUBLINGUAL_TABLET | SUBLINGUAL | 1 refills | Status: DC | PRN
Start: 2019-10-04 — End: 2021-08-26

## 2019-10-04 NOTE — Progress Notes (Addendum)
Subjective:    Patient ID: Mandy Durham, female    DOB: 11-27-1945, 74 y.o.   MRN: [id number]  DOS:  10/04/2019 Type of visit - description: Acute, last visit 10-2018, multiple symptoms.  Chronic fatigue: Getting worse  Chest pain: Reports that for the last couple of months has developed chest tightness anteriorly after she walks 15 to 20 yards.  The pain stops approximately 30 minutes after she exerts.. No associated diaphoresis, sweats, + DOE which is not new. She uses a fist over the chest to describe the pain.  Reports some weight loss at home.  Continue with anxiety, related to family issues, also her husband fell that increase her stress.  Back pain for the last 4 weeks, bilateral, low, no radiation. Decreased with Tylenol and a heating pad. No recent injury. No lower extremity paresthesias Bending forward eases the pain to some extent.   Wt Readings from Last 3 Encounters:  10/04/19 135 lb 8 oz (61.5 kg)  06/06/19 151 lb (68.5 kg)  12/05/18 141 lb 12.8 oz (64.3 kg)    Review of Systems No lower extremity edema or palpitations No GERD symptoms No cough or wheezing   Past Medical History:  Diagnosis Date  . Anxiety state, unspecified   . Hyperlipidemia   . Hypertension   . Insomnia   . LBBB (left bundle branch block)    LHC in 2002 showed normal coronaries.   . Osteoarthrosis, unspecified whether generalized or localized, unspecified site   . Overweight(278.02)   . S/P placement of cardiac pacemaker    a. 02/21/16: Medtronic Advisa DR MRI SureScan model [id number] (serial number [id number])   . Serrated adenoma of colon 2007  . Symptomatic menopausal or female climacteric states   . Syncope and collapse    11/12: Holter 12/12 with rare PACS, HR range 64-140, average 82, no significant arrhythmias. Echo (1/13): EF 50-55%, mild LVH, septal-lateral dyssynchrony c/w LBBB. 3-week event monitor (1/13): No significant arrhythmia.     Past Surgical History:   Procedure Laterality Date  . COLONOSCOPY  2007  . EP IMPLANTABLE DEVICE N/A 02/21/2016   Procedure: Pacemaker Implant;  Surgeon: Will Meredith Leeds, MD;  Location: [facility];  Service: Cardiovascular;  Laterality: N/A;  . POLYPECTOMY      Allergies as of 10/04/2019   No Known Allergies     Medication List       Accurate as of October 04, 2019 10:32 PM. If you have any questions, ask your nurse or doctor.        acetaminophen 500 MG tablet Commonly known as: TYLENOL Take 500 mg by mouth every 6 (six) hours as needed.   carbidopa-levodopa 25-100 MG tablet Commonly known as: SINEMET IR Take 1 tablet by mouth 3 (three) times daily.   escitalopram 5 MG tablet Commonly known as: Lexapro Take 1 tablet (5 mg total) by mouth at bedtime. Started by: Kathlene November, MD   ezetimibe 10 MG tablet Commonly known as: ZETIA Take 1 tablet (10 mg total) by mouth daily.   ibandronate 150 MG tablet Commonly known as: BONIVA ibandronate 150 mg tablet  TAKE 1 TABLET BY MOUTH ONCE EVERY MONTH IN THE MORNING FOR 30 DAYS   losartan 50 MG tablet Commonly known as: COZAAR Take 1 tablet by mouth once daily   nitroGLYCERIN 0.4 MG SL tablet Commonly known as: NITROSTAT Place 1 tablet (0.4 mg total) under the tongue every 5 (five) minutes x 3 doses as needed for  chest pain. Started by: Kathlene November, MD   VITAMIN D PO Take 1 tablet by mouth 2 (two) times a week. Vitamin D          Objective:   Physical Exam BP 120/76 (BP Location: Left Arm, Patient Position: Sitting, Cuff Size: Small)   Pulse 92   Temp 97.7 F (36.5 C) (Oral)   Resp 16   Ht 5\' 6"  (1.676 m)   Wt 135 lb 8 oz (61.5 kg)   SpO2 97%   BMI 21.87 kg/m  General:   Well developed, NAD, BMI noted.  HEENT:  Normocephalic . Face symmetric, atraumatic Lungs:  CTA B Normal respiratory effort, no intercostal retractions, no accessory muscle use. Heart: RRR,  no murmur.  Abdomen:  Not distended, soft, non-tender. No rebound  or rigidity.   Skin: Not pale. Not jaundice Lower extremities: no pretibial edema bilaterally MSK: No TTP at the lower back. Neurologic:  alert & oriented X3.  Speech normal, gait appropriate for age and unassisted.  Motor symmetric, no straight leg test pain. Psych--  Cognition and judgment appear intact.  Cooperative with normal attention span and concentration.  Behavior appropriate. + Anxious, not depressed appearing.     Assessment      Assessment Prediabetes  HTN : amlodipine: edema 10/2018 Hyperlipidemia, statin intolerant Anxiety, insomnia:  Xanax  Menopausal; DEXA 2012 -1.5 @ gyn Recurrent Syncope --3:1 AV Block pacemaker 02/21/16 -- extensive cards eval before, DX with autonomic insufficiency ~ 02-2015 --on a  abd binder , pyridostigmine, rx midodrine 06-2015 per cards  Traumatic SAH (after a syncope),TBI--- admitted 01-2015 , d/c to a rehab unit temporarily -- was rx seroquel (aparently for ICU delirium) -- d/c 07-2015 --rx topamax when at rehab for post [facility]  HAs -- had issues w/b/b incontinence, better as of 07-2015 Tremors LUE, onset 9 -2017 Osteopneia: on Boniva, rx elsewhere  PLAN  Last visit about a year ago, multiple issues:  Chest pain: This is a new symptom for her, description c/w stable angina, EKG today sinus rhythm, wide QRS, likely LBBB, unchanged from previous.  On chart review I do not see previous Myoview or catheterization. Plan: Chest x-ray, prompt referral, ER if symptoms severe Fatigue: Chronic, checking labs HTN, on losartan, seems well controlled although there is no ambulatory BPs Weight loss?  Weight today 135 pounds, weight in March 151 pounds however 135 pounds is more in line with her baseline.  Checking labs Anxiety: Anxious again, in the past Lexapro helped rec to  go back on it. B12 deficiency: Due to the quarantine has not been getting her shots.  Rec injection today and next week.  Checking levels. Addendum: B12 not done. Back pain:  See HPI, going on for 4 weeks, x-rays Labs: CMP, CBC, TSH, B12 X-rays: Chest x-ray, lumbosacral x-rays. Addendum: Cardiology contacted, they will see her this week, appreciate their help. RTC 3 weeks.  Time spent today 42 minutes, due to the number of issues assessed today , chart review  and time spent on care coordination.  This visit occurred during the SARS-CoV-2 public health emergency.  Safety protocols were in place, including screening questions prior to the visit, additional usage of staff PPE, and extensive cleaning of exam room while observing appropriate contact time as indicated for disinfecting solutions.

## 2019-10-04 NOTE — Telephone Encounter (Signed)
Kaylyn from patient's PCP's office stating Dr. Larose Kells is requesting to speak with DOD about the patient's EKG.

## 2019-10-04 NOTE — Telephone Encounter (Signed)
Reviewed EKG - LBBB, unchanged from baseline. Agree with management and plan outlined by Dr Larose Kells. Pt scheduled for an office visit here on Friday.

## 2019-10-04 NOTE — Progress Notes (Signed)
Pre visit review using our clinic review tool, if applicable. No additional management support is needed unless otherwise documented below in the visit note. 

## 2019-10-04 NOTE — Telephone Encounter (Signed)
Spoke with Dr. Larose Kells. He states the patient c/o DOE and intermittent CP x 2 mos. She is CP free today. He is ordering general labs, CXR, and giving her a NTG rx. He requests a visit this week.  Scheduled the patient with R. Charlcie Cradle. Dr. Larose Kells requests a call back if Dr. Burt Knack (DOD) reviews note/EKG and has different recommendations.

## 2019-10-04 NOTE — Assessment & Plan Note (Addendum)
Last visit about a year ago, multiple issues:  Chest pain: This is a new symptom for her, description c/w stable angina, EKG today sinus rhythm, wide QRS, likely LBBB, unchanged from previous.  On chart review I do not see previous Myoview or catheterization. Plan: Chest x-ray, prompt referral, ER if symptoms severe Fatigue: Chronic, checking labs HTN, on losartan, seems well controlled although there is no ambulatory BPs Weight loss?  Weight today 135 pounds, weight in March 151 pounds however 135 pounds is more in line with her baseline.  Checking labs Anxiety: Anxious again, in the past Lexapro helped rec to  go back on it. B12 deficiency: Due to the quarantine has not been getting her shots.  Rec injection today and next week.  Checking levels. Addendum: B12 not done. Back pain: See HPI, going on for 4 weeks, x-rays Labs: CMP, CBC, TSH, B12 X-rays: Chest x-ray, lumbosacral x-rays. Addendum: Cardiology contacted, they will see her this week, appreciate their help. RTC 3 weeks.

## 2019-10-04 NOTE — Patient Instructions (Addendum)
Cardiology appointment 10/06/19 at 10:15am w/ Renee (Dr. Curt Bears PA) [facility] office Go back on Lexapro 5 mg every night  Other medications the same  Rest  If chest pain: Take nitroglycerin sublingual every 5 minutes x 3. Call 911 If after the second nitroglycerin is not relieving the pain. Also call 911 if the pain is severe to begin with   GO TO THE LAB : Get the blood work     [place], [place] back for   a checkup in 3 weeks  STOP BY THE FIRST FLOOR:  get the XR

## 2019-10-05 LAB — COMPREHENSIVE METABOLIC PANEL
ALT: 10 U/L (ref 0–35)
AST: 22 U/L (ref 0–37)
Albumin: 4.2 g/dL (ref 3.5–5.2)
Alkaline Phosphatase: 68 U/L (ref 39–117)
BUN: 17 mg/dL (ref 6–23)
CO2: 28 mEq/L (ref 19–32)
Calcium: 9.5 mg/dL (ref 8.4–10.5)
Chloride: 103 mEq/L (ref 96–112)
Creatinine, Ser: 0.76 mg/dL (ref 0.40–1.20)
GFR: 74.18 mL/min (ref >60.00)
Glucose, Bld: 98 mg/dL (ref 70–99)
Potassium: 4.2 mEq/L (ref 3.5–5.1)
Sodium: 138 mEq/L (ref 135–145)
Total Bilirubin: 0.4 mg/dL (ref 0.2–1.2)
Total Protein: 6.8 g/dL (ref 6.0–8.3)

## 2019-10-05 LAB — CBC WITH DIFFERENTIAL/PLATELET
Basophils Absolute: 0.1 10*3/uL (ref 0.0–0.1)
Basophils Relative: 0.9 % (ref 0.0–3.0)
Eosinophils Absolute: 0.1 10*3/uL (ref 0.0–0.7)
Eosinophils Relative: 0.7 % (ref 0.0–5.0)
HCT: 40.2 % (ref 36.0–46.0)
Hemoglobin: 13.4 g/dL (ref 12.0–15.0)
Lymphocytes Relative: 24.8 % (ref 12.0–46.0)
Lymphs Abs: 1.7 10*3/uL (ref 0.7–4.0)
MCHC: 33.3 g/dL (ref 30.0–36.0)
MCV: 88.2 fl (ref 78.0–100.0)
Monocytes Absolute: 0.5 10*3/uL (ref 0.1–1.0)
Monocytes Relative: 7.3 % (ref 3.0–12.0)
Neutro Abs: 4.5 10*3/uL (ref 1.4–7.7)
Neutrophils Relative %: 66.3 % (ref 43.0–77.0)
Platelets: 303 10*3/uL (ref 150.0–400.0)
RBC: 4.56 Mil/uL (ref 3.87–5.11)
RDW: 14.1 % (ref 11.5–15.5)
WBC: 6.7 10*3/uL (ref 4.0–10.5)

## 2019-10-05 LAB — VITAMIN B12: Vitamin B-12: 390 pg/mL (ref 211–911)

## 2019-10-05 LAB — TSH: TSH: 0.86 u[IU]/mL (ref 0.35–4.50)

## 2019-10-06 ENCOUNTER — Other Ambulatory Visit: Payer: Self-pay

## 2019-10-06 ENCOUNTER — Ambulatory Visit: Payer: PPO | Admitting: Physician Assistant

## 2019-10-06 ENCOUNTER — Encounter: Payer: Self-pay | Admitting: *Deleted

## 2019-10-06 VITALS — BP 132/68 | HR 91 | Ht 66.0 in | Wt 135.0 lb

## 2019-10-06 DIAGNOSIS — I441 Atrioventricular block, second degree: Secondary | ICD-10-CM

## 2019-10-06 DIAGNOSIS — Z950 Presence of cardiac pacemaker: Secondary | ICD-10-CM

## 2019-10-06 DIAGNOSIS — R0600 Dyspnea, unspecified: Secondary | ICD-10-CM

## 2019-10-06 DIAGNOSIS — R0609 Other forms of dyspnea: Secondary | ICD-10-CM

## 2019-10-06 DIAGNOSIS — I10 Essential (primary) hypertension: Secondary | ICD-10-CM

## 2019-10-06 NOTE — Patient Instructions (Addendum)
Medication Instructions:     START TAKING ASPIRIN 81 MG ONCE A DAY   *If you need a refill on your cardiac medications before your next appointment, please call your pharmacy*   Lab Work:  NONE ORDERED  TODAY   If you have labs (blood work) drawn today and your tests are completely normal, you will receive your results only by: Marland Kitchen MyChart Message (if you have MyChart) OR . A paper copy in the mail If you have any lab test that is abnormal or we need to change your treatment, we will call you to review the results.   Testing/Procedures:  ASAP Your physician has requested that you have an echocardiogram. Echocardiography is a painless test that uses sound waves to create images of your heart. It provides your doctor with information about the size and shape of your heart and how well your heart's chambers and valves are working. This procedure takes approximately one hour. There are no restrictions for this procedure.  ASAP Your physician has requested that you have a lexiscan myoview. For further information please visit [email or web address]. Please follow instruction sheet, as given.    Follow-Up: At [facility], you and your health needs are our priority.  As part of our continuing mission to provide you with exceptional heart care, we have created designated Provider Care Teams.  These Care Teams include your primary Cardiologist (physician) and Advanced Practice Providers (APPs -  Physician Assistants and Nurse Practitioners) who all work together to provide you with the care you need, when you need it.  We recommend signing up for the patient portal called "MyChart".  Sign up information is provided on this After Visit Summary.  MyChart is used to connect with patients for Virtual Visits (Telemedicine).  Patients are able to view lab/test results, encounter notes, upcoming appointments, etc.  Non-urgent messages can be sent to your provider as well.   To learn more about what you can  do with MyChart, go to [email or web address].    Your next appointment:   2-3  week(s)  The format for your next appointment:   In Person  Provider:   You may see Will Meredith Leeds, MD or one of the following Advanced Practice Providers on your designated Care Team:    Chanetta Marshall, NP  Tommye Standard, PA-C  Legrand Como "Oda Kilts, [place]    Other Instructions

## 2019-10-06 NOTE — Progress Notes (Addendum)
Cardiology Office Note Date:  10/06/2019  Patient ID:  Mandy Durham, Mahadeo 1945/12/31, MRN [id number] PCP:  Colon Branch, MD  Cardiologist: Dr. Aundra Dubin [phone])  Electrophysiologist:  Dr. Curt Bears    Chief Complaint: DOE  History of Present Illness: Mandy Durham is a 74 y.o. female with history of HTN, HLD, LBBB (since around 2000, remote notes report LHC in 2002 showed normal coronaries), traumatic SAH (2016)advanced heart block w/PPM. She has a stuttering speech pattern and a R hand/forearm tremor she state since the time of her head injury.  She comes in today to be seen for Dr. Curt Bears, last seen by him march 2021, at that time reported fatigue, woke feeling well though quickly running out of energy leg felt heavy. Toprol was stopped and if no improvement suggested a f/u echo and re-eval of her EF  She was seen 10/04/19 by her PMD with c/o intermittent/exertional cp, EKG was unchanged from prior, given PRN s/l NTG, discussed with our DOD that day and comes today to follow up/further evaluation of her cp. Labs looked ok  TODAY She reports for the last couple months getting SOB easily.  Reports that before she had no difficulties with her ADLs, gives an example of walking to her mail box and back without SOB and now has to catch her breath at the mailbox to walk back, and is SOB once back at the house.  When asked how far a walk it is she reports "not too far" She will get winded with some house chores, not others.  This very unusual for her. When asked about CP, she says no, just very SOB. Discussed if any heaviness, pressure, she says no. She mentions that she told Dr. Curt Bears last time here and he stopped one of her medicines but that did not help.  When I tell her that was end of March she is surprised, states she did not think she had been feeling poorly that long. She is certain and firm that she is not having CP, but the SOB does make her anxious No rest SOB, no dizzy spells, near  syncope or syncope  She denies any swelling ,bolating, no symptoms of orthopnea or PND.   Device information MDT dual chamber PPM implanted 02/21/2016  Past Medical History:  Diagnosis Date  . Anxiety state, unspecified   . Hyperlipidemia   . Hypertension   . Insomnia   . LBBB (left bundle branch block)    LHC in 2002 showed normal coronaries.   . Osteoarthrosis, unspecified whether generalized or localized, unspecified site   . Overweight(278.02)   . S/P placement of cardiac pacemaker    a. 02/21/16: Medtronic Advisa DR MRI SureScan model [id number] (serial number [id number])   . Serrated adenoma of colon 2007  . Symptomatic menopausal or female climacteric states   . Syncope and collapse    11/12: Holter 12/12 with rare PACS, HR range 64-140, average 82, no significant arrhythmias. Echo (1/13): EF 50-55%, mild LVH, septal-lateral dyssynchrony c/w LBBB. 3-week event monitor (1/13): No significant arrhythmia.     Past Surgical History:  Procedure Laterality Date  . COLONOSCOPY  2007  . EP IMPLANTABLE DEVICE N/A 02/21/2016   Procedure: Pacemaker Implant;  Surgeon: Will Meredith Leeds, MD;  Location: [facility];  Service: Cardiovascular;  Laterality: N/A;  . POLYPECTOMY      Current Outpatient Medications  Medication Sig Dispense Refill  . acetaminophen (TYLENOL) 500 MG tablet Take 500 mg by  mouth every 6 (six) hours as needed.    . carbidopa-levodopa (SINEMET IR) 25-100 MG tablet Take 1 tablet by mouth 3 (three) times daily. 270 tablet 3  . Cholecalciferol (VITAMIN D PO) Take 1 tablet by mouth 2 (two) times a week. Vitamin D    . escitalopram (LEXAPRO) 5 MG tablet Take 1 tablet (5 mg total) by mouth at bedtime. 30 tablet 1  . ezetimibe (ZETIA) 10 MG tablet Take 1 tablet (10 mg total) by mouth daily. 90 tablet 3  . ibandronate (BONIVA) 150 MG tablet ibandronate 150 mg tablet  TAKE 1 TABLET BY MOUTH ONCE EVERY MONTH IN THE MORNING FOR 30 DAYS    . losartan (COZAAR) 50 MG  tablet Take 1 tablet by mouth once daily 90 tablet 2  . nitroGLYCERIN (NITROSTAT) 0.4 MG SL tablet Place 1 tablet (0.4 mg total) under the tongue every 5 (five) minutes x 3 doses as needed for chest pain. 25 tablet 1   No current facility-administered medications for this visit.    Allergies:   Patient has no known allergies.   Social History:  The patient  reports that she has never smoked. She has never used smokeless tobacco. She reports that she does not drink alcohol and does not use drugs.   Family History:  The patient's family history includes Coronary artery disease in her mother; Dementia in her mother; Diabetes in her mother; Heart failure in her mother.  ROS:  Please see the history of present illness.  All other systems are reviewed and otherwise negative.   PHYSICAL EXAM:  VS:  BP (!) 132/68   Pulse 91   Ht 5\' 6"  (1.676 m)   Wt 135 lb (61.2 kg)   SpO2 96%   BMI 21.79 kg/m  BMI: Body mass index is 21.79 kg/m. Well nourished, well developed, in no acute distress  HEENT: normocephalic, atraumatic  Neck: no JVD, carotid bruits or masses Cardiac:  RRR; soft SM, no rubs, or gallops Lungs:  CTA b/l, no wheezing, rhonchi or rales  Abd: soft, nontender MS: no deformity, thin body habitus, age appropriate atrophy Ext:  no edema  Skin: warm and dry, no rash Neuro:  She has a stuttered speech pattern and a resting R hand/forearm tremor, otherwise no gross deficits appreciated Psych: euthymic mood, full affect  PPM site is stable, no tethering or discomfort   EKG:  Done 10/04/2019 by her PMD is reviewed by myself shows  SR 89bpm, LBBB (unchanged)  PPM interrogation done today and reviewed by myself: Battery and lead measurements are good She only VP 2.1% AS/SV 97.7% One NSVT on 08/28/19 6 beats No AR   TTE 07/17/15  Review of the above records today demonstrates:  - Left ventricle: The cavity size was normal. There was severe focal basal hypertrophy of the septum.  Systolic function was normal. The estimated ejection fraction was in the range of 50% to 55%. Anteroseptal hypokinesis. Doppler parameters are consistent with abnormal left ventricular relaxation (grade 1 diastolic dysfunction). The E/e&' ratio is between 8-15, suggesting indeterminate LV filling pressure. - Mitral valve: Mildly thickened leaflets . There was trivial regurgitation. - Left atrium: The atrium was normal in size. - Tricuspid valve: There was mild regurgitation. - Pulmonary arteries: PA peak pressure: 32 mm Hg (S). - Inferior vena cava: The vessel was normal in size. The respirophasic diameter changes were in the normal range (>= 50%), consistent with normal central venous pressure.  Recent Labs: 10/04/2019: ALT 10; BUN 17;  Creatinine, Ser 0.76; Hemoglobin 13.4; Platelets 303.0; Potassium 4.2; Sodium 138; TSH 0.86  No results found for requested labs within last 8760 hours.   Estimated Creatinine Clearance: 56.9 mL/min (by C-G formula based on SCr of 0.76 mg/dL).   Wt Readings from Last 3 Encounters:  10/06/19 135 lb (61.2 kg)  10/04/19 135 lb 8 oz (61.5 kg)  06/06/19 151 lb (68.5 kg)     Other studies reviewed: Additional studies/records reviewed today include: summarized above  ASSESSMENT AND PLAN:  1. PPM     Intact function, no programming changes amde  2. HTN     Looks OK  3. DOE     Has been somewhat progressive since March after we discussed this at length today, she a number of times today denies CP     She VP minimally     Worry she may have developed CM     Possible anginal equivalent  Start ECASA 81mg  daily Discussed with her getting an echo, and strategies for evaluating for CAD stress test vs cath.  Given she has had symptoms for about 4-30mo, no real escalation of late, no rest symptoms, will start with stress test, discussed cath if abnormal. She is agreeable to the plan, will have her back in a few weeks to follow up, sooner if  needed.  Discussed should she have any escalation in her symptom, behavior, to seek medical attention.  Disposition: F/u as above  Current medicines are reviewed at length with the patient today.  The patient did not have any concerns regarding medicines.  Venetia Night, PA-C 10/06/2019 1:17 PM     [address] [place] [address] [phone] (office)  [phone] (fax)

## 2019-10-16 ENCOUNTER — Encounter: Payer: Self-pay | Admitting: Neurology

## 2019-10-17 ENCOUNTER — Telehealth (HOSPITAL_COMMUNITY): Payer: Self-pay | Admitting: *Deleted

## 2019-10-17 NOTE — Telephone Encounter (Signed)
Patient given detailed instructions per Myocardial Perfusion Study Information Sheet for the test on 10/20/19 at 7:15. Patient notified to arrive 15 minutes early and that it is imperative to arrive on time for appointment to keep from having the test rescheduled.  If you need to cancel or reschedule your appointment, please call the office within 24 hours of your appointment. . Patient verbalized understanding.Mandy Durham

## 2019-10-20 ENCOUNTER — Ambulatory Visit (HOSPITAL_COMMUNITY): Payer: PPO | Attending: Internal Medicine

## 2019-10-20 ENCOUNTER — Other Ambulatory Visit: Payer: Self-pay

## 2019-10-20 ENCOUNTER — Ambulatory Visit (HOSPITAL_BASED_OUTPATIENT_CLINIC_OR_DEPARTMENT_OTHER): Payer: PPO

## 2019-10-20 DIAGNOSIS — I10 Essential (primary) hypertension: Secondary | ICD-10-CM

## 2019-10-20 DIAGNOSIS — R0600 Dyspnea, unspecified: Secondary | ICD-10-CM | POA: Diagnosis not present

## 2019-10-20 DIAGNOSIS — R0609 Other forms of dyspnea: Secondary | ICD-10-CM

## 2019-10-20 LAB — MYOCARDIAL PERFUSION IMAGING
LV dias vol: 65 mL (ref 46–106)
LV sys vol: 32 mL
Peak HR: 105 {beats}/min
Rest HR: 77 {beats}/min
SDS: 1
SRS: 0
SSS: 1
TID: 0.9

## 2019-10-20 LAB — ECHOCARDIOGRAM COMPLETE
Area-P 1/2: 3.85 cm2
Height: 66 in
S' Lateral: 3.3 cm
Weight: 2160 oz

## 2019-10-20 MED ORDER — REGADENOSON 0.4 MG/5ML IV SOLN
0.4000 mg | Freq: Once | INTRAVENOUS | Status: AC
  Administered 2019-10-20: 0.4 mg via INTRAVENOUS

## 2019-10-20 MED ORDER — TECHNETIUM TC 99M TETROFOSMIN IV KIT
10.0000 | PACK | Freq: Once | INTRAVENOUS | Status: AC | PRN
  Administered 2019-10-20: 10 via INTRAVENOUS
  Filled 2019-10-20: qty 10

## 2019-10-20 MED ORDER — TECHNETIUM TC 99M TETROFOSMIN IV KIT
31.6000 | PACK | Freq: Once | INTRAVENOUS | Status: AC | PRN
  Administered 2019-10-20: 31.6 via INTRAVENOUS
  Filled 2019-10-20: qty 32

## 2019-10-23 ENCOUNTER — Other Ambulatory Visit: Payer: Self-pay | Admitting: *Deleted

## 2019-10-23 DIAGNOSIS — Z79899 Other long term (current) drug therapy: Secondary | ICD-10-CM

## 2019-10-23 MED ORDER — FUROSEMIDE 20 MG PO TABS
20.0000 mg | ORAL_TABLET | Freq: Every day | ORAL | 6 refills | Status: DC
Start: 2019-10-23 — End: 2019-11-02

## 2019-10-23 NOTE — Addendum Note (Signed)
Addended by: Claude Manges on: 10/23/2019 09:13 AM   Modules accepted: Orders

## 2019-10-24 ENCOUNTER — Other Ambulatory Visit: Payer: Self-pay | Admitting: Neurology

## 2019-10-24 MED ORDER — CARBIDOPA-LEVODOPA 25-100 MG PO TABS: 1.0000 | ORAL_TABLET | Freq: Three times a day (TID) | ORAL | 0 refills | Status: DC

## 2019-10-26 ENCOUNTER — Ambulatory Visit: Payer: PPO | Admitting: Physician Assistant

## 2019-10-26 ENCOUNTER — Ambulatory Visit: Payer: PPO | Admitting: Adult Health

## 2019-10-26 VITALS — BP 120/71 | HR 89 | Ht 66.0 in | Wt 138.0 lb

## 2019-10-26 DIAGNOSIS — R251 Tremor, unspecified: Secondary | ICD-10-CM | POA: Diagnosis not present

## 2019-10-26 MED ORDER — CARBIDOPA-LEVODOPA 25-100 MG PO TABS: 1.0000 | ORAL_TABLET | Freq: Three times a day (TID) | ORAL | 3 refills | Status: DC

## 2019-10-26 NOTE — Patient Instructions (Signed)
Your Plan:  Continue Sinemet If your symptoms worsen or you develop new symptoms please let us know.   Thank you for coming to see us at [facility]. I hope we have been able to provide you high quality care today.  You may receive a patient satisfaction survey over the next few weeks. We would appreciate your feedback and comments so that we may continue to improve ourselves and the health of our patients.  

## 2019-10-26 NOTE — Progress Notes (Signed)
PATIENT: Mandy Durham DOB: 02-Dec-1945  REASON FOR VISIT: follow up HISTORY FROM: patient  HISTORY OF PRESENT ILLNESS: Today 10/26/19:  Mandy Durham is a 74 year old female with a history of tremor.  She returns today for follow-up.  She feels that her tremor has remained relatively stable.  She states that she has been under more stress recently and does feel exhausted.  She continues on Sinemet 3 times a day.  Denies any significant changes with her gait.  Denies any falls.  She continues to have trouble with expressive aphasia.  She returns today for follow-up.  HISTORY (Copied from Dr.Dohmeier's note) Mandy Durham had originally asked for this appointment due to incontinence following a SAH. She had fainted, fell and aquiered the TBI with SAH. She states that the incontinence  is improved. She still has some urine leakage and soft stools. She wonders if this is still a neurologic impairment from the [facility]. She  has no pain with bowel movements, currently no headaches or nausea. She remains on Mestinon to protect her from fainting spells.  Patient was last seen in April 2019 by Venancio Poisson, NP.  She had tried carbidopa - Ldopa, and it had initially helped her tremors, which I believed to be essential tremor.  She now reports that the tremor is no longer controlled. She is on bid Sinemet 25/ 100 mg.     REVIEW OF SYSTEMS: Out of a complete 14 system review of symptoms, the patient complains only of the following symptoms, and all other reviewed systems are negative.  See HPI  ALLERGIES: No Known Allergies  HOME MEDICATIONS: Outpatient Medications Prior to Visit  Medication Sig Dispense Refill  . acetaminophen (TYLENOL) 500 MG tablet Take 500 mg by mouth every 6 (six) hours as needed.    Marland Kitchen aspirin EC 81 MG tablet Take 81 mg by mouth daily. Swallow whole.    . carbidopa-levodopa (SINEMET IR) 25-100 MG tablet Take 1 tablet by mouth 3 (three) times daily. 270 tablet 0  .  Cholecalciferol (VITAMIN D PO) Take 1 tablet by mouth 2 (two) times a week. Vitamin D    . escitalopram (LEXAPRO) 5 MG tablet Take 1 tablet (5 mg total) by mouth at bedtime. 30 tablet 1  . ezetimibe (ZETIA) 10 MG tablet Take 1 tablet (10 mg total) by mouth daily. 90 tablet 3  . furosemide (LASIX) 20 MG tablet Take 1 tablet (20 mg total) by mouth daily. 30 tablet 6  . ibandronate (BONIVA) 150 MG tablet ibandronate 150 mg tablet  TAKE 1 TABLET BY MOUTH ONCE EVERY MONTH IN THE MORNING FOR 30 DAYS    . losartan (COZAAR) 50 MG tablet Take 1 tablet by mouth once daily 90 tablet 2  . nitroGLYCERIN (NITROSTAT) 0.4 MG SL tablet Place 1 tablet (0.4 mg total) under the tongue every 5 (five) minutes x 3 doses as needed for chest pain. 25 tablet 1   No facility-administered medications prior to visit.    PAST MEDICAL HISTORY: Past Medical History:  Diagnosis Date  . Anxiety state, unspecified   . Hyperlipidemia   . Hypertension   . Insomnia   . LBBB (left bundle branch block)    LHC in 2002 showed normal coronaries.   . Osteoarthrosis, unspecified whether generalized or localized, unspecified site   . Overweight(278.02)   . S/P placement of cardiac pacemaker    a. 02/21/16: Medtronic Advisa DR MRI SureScan model [id number] (serial number [id number])   . Serrated  adenoma of colon 2007  . Symptomatic menopausal or female climacteric states   . Syncope and collapse    11/12: Holter 12/12 with rare PACS, HR range 64-140, average 82, no significant arrhythmias. Echo (1/13): EF 50-55%, mild LVH, septal-lateral dyssynchrony c/w LBBB. 3-week event monitor (1/13): No significant arrhythmia.     PAST SURGICAL HISTORY: Past Surgical History:  Procedure Laterality Date  . COLONOSCOPY  2007  . EP IMPLANTABLE DEVICE N/A 02/21/2016   Procedure: Pacemaker Implant;  Surgeon: Will Meredith Leeds, MD;  Location: [facility];  Service: Cardiovascular;  Laterality: N/A;  . POLYPECTOMY      FAMILY  HISTORY: Family History  Problem Relation Age of Onset  . Heart failure Mother   . Coronary artery disease Mother   . Dementia Mother   . Diabetes Mother   . Colon cancer Neg Hx   . Breast cancer Neg Hx   . Hypertension Neg Hx   . Heart disease Neg Hx   . Heart attack Neg Hx   . Stroke Neg Hx   . Rectal cancer Neg Hx   . Stomach cancer Neg Hx   . Esophageal cancer Neg Hx   . Liver disease Neg Hx     SOCIAL HISTORY: Social History   Socioeconomic History  . Marital status: Married    Spouse name: [name]  . Number of children: 2  . Years of education: Not on file  . Highest education level: Not on file  Occupational History  . Occupation: retired Environmental consultant: [employer]  Tobacco Use  . Smoking status: Never Smoker  . Smokeless tobacco: Never Used  Vaping Use  . Vaping Use: Never used  Substance and Sexual Activity  . Alcohol use: No  . Drug use: No  . Sexual activity: Not on file  Other Topics Concern  . Not on file  Social History Narrative   HSG.  Married '65.  2 daughters, '71, '77; 4 grandchildren         Social Determinants of Radio broadcast assistant Strain:   . Difficulty of Paying Living Expenses:   Food Insecurity:   . Worried About Charity fundraiser in the Last Year:   . Arboriculturist in the Last Year:   Transportation Needs:   . Film/video editor (Medical):   Marland Kitchen Lack of Transportation (Non-Medical):   Physical Activity:   . Days of Exercise per Week:   . Minutes of Exercise per Session:   Stress:   . Feeling of Stress :   Social Connections:   . Frequency of Communication with Friends and Family:   . Frequency of Social Gatherings with Friends and Family:   . Attends Religious Services:   . Active Member of Clubs or Organizations:   . Attends Archivist Meetings:   Marland Kitchen Marital Status:   Intimate Partner Violence:   . Fear of Current or Ex-Partner:   . Emotionally Abused:   Marland Kitchen  Physically Abused:   . Sexually Abused:       PHYSICAL EXAM  Vitals:   10/26/19 1401  BP: 120/71  Pulse: 89  Weight: 138 lb (62.6 kg)  Height: 5\' 6"  (1.676 m)   Body mass index is 22.27 kg/m.  Generalized: Well developed, in no acute distress   Neurological examination  Mentation: Alert oriented to time, place, history taking. Follows all commands speech and language fluent Cranial nerve II-XII: Pupils were equal  round reactive to light. Extraocular movements were full, visual field were full on confrontational test. Facial sensation and strength were normal. Uvula tongue midline. Head turning and shoulder shrug  were normal and symmetric. Motor: The motor testing reveals 5 over 5 strength of all 4 extremities. Good symmetric motor tone is noted throughout.  Resting tremor noted in the left hand. Sensory: Sensory testing is intact to soft touch on all 4 extremities. No evidence of extinction is noted.  Coordination: Cerebellar testing reveals good finger-nose-finger and heel-to-shin bilaterally.  Gait and station: Gait is normal.  Good stride.  Good turns and arm swing.  Tremor noted in the left hand..  Reflexes: Deep tendon reflexes are symmetric and normal bilaterally.   DIAGNOSTIC DATA (LABS, IMAGING, TESTING) - I reviewed patient records, labs, notes, testing and imaging myself where available.  Lab Results  Component Value Date   WBC 6.7 10/04/2019   HGB 13.4 10/04/2019   HCT 40.2 10/04/2019   MCV 88.2 10/04/2019   PLT 303.0 10/04/2019      Component Value Date/Time   NA 138 10/04/2019 1518   NA 140 01/05/2018 1319   K 4.2 10/04/2019 1518   CL 103 10/04/2019 1518   CO2 28 10/04/2019 1518   GLUCOSE 98 10/04/2019 1518   BUN 17 10/04/2019 1518   BUN 13 01/05/2018 1319   CREATININE 0.76 10/04/2019 1518   CALCIUM 9.5 10/04/2019 1518   PROT 6.8 10/04/2019 1518   PROT 6.7 10/28/2015 1350   ALBUMIN 4.2 10/04/2019 1518   ALBUMIN 3.9 10/28/2015 1350   AST 22  10/04/2019 1518   ALT 10 10/04/2019 1518   ALKPHOS 68 10/04/2019 1518   BILITOT 0.4 10/04/2019 1518   BILITOT 0.3 10/28/2015 1350   GFRNONAA 81 01/05/2018 1319   GFRAA 93 01/05/2018 1319   Lab Results  Component Value Date   CHOL 237 (H) 08/16/2017   HDL 104.40 08/16/2017   LDLCALC 114 (H) 08/16/2017   LDLDIRECT 106.9 11/03/2012   TRIG 91.0 08/16/2017   CHOLHDL 2 08/16/2017   Lab Results  Component Value Date   HGBA1C 6.4 08/09/2018   Lab Results  Component Value Date   VITAMINB12 390 10/04/2019   Lab Results  Component Value Date   TSH 0.86 10/04/2019      ASSESSMENT AND PLAN 74 y.o. year old female  has a past medical history of Anxiety state, unspecified, Hyperlipidemia, Hypertension, Insomnia, LBBB (left bundle branch block), Osteoarthrosis, unspecified whether generalized or localized, unspecified site, Overweight(278.02), S/P placement of cardiac pacemaker, Serrated adenoma of colon (2007), Symptomatic menopausal or female climacteric states, and Syncope and collapse. here with :  Tremor   Continue Sinemet 25-100 mg 3 times a day  We will continue to monitor for signs and symptoms of Parkinson's disease.  Follow-up in 1 year or sooner if needed   I spent 20 minutes of face-to-face and non-face-to-face time with patient.  This included previsit chart review, lab review, study review, order entry, electronic health record documentation, patient education.  Ward Givens, MSN, NP-C 10/26/2019, 2:24 PM [facility] [address] [place], [place] [address] [phone]

## 2019-10-27 ENCOUNTER — Other Ambulatory Visit: Payer: Self-pay

## 2019-10-27 ENCOUNTER — Ambulatory Visit (INDEPENDENT_AMBULATORY_CARE_PROVIDER_SITE_OTHER): Payer: PPO | Admitting: Internal Medicine

## 2019-10-27 ENCOUNTER — Telehealth: Payer: Self-pay | Admitting: Physician Assistant

## 2019-10-27 VITALS — BP 131/74 | HR 87 | Temp 98.3°F | Resp 18 | Ht 66.0 in | Wt 135.6 lb

## 2019-10-27 DIAGNOSIS — M549 Dorsalgia, unspecified: Secondary | ICD-10-CM

## 2019-10-27 DIAGNOSIS — G8929 Other chronic pain: Secondary | ICD-10-CM | POA: Diagnosis not present

## 2019-10-27 DIAGNOSIS — F419 Anxiety disorder, unspecified: Secondary | ICD-10-CM | POA: Diagnosis not present

## 2019-10-27 DIAGNOSIS — R0600 Dyspnea, unspecified: Secondary | ICD-10-CM | POA: Diagnosis not present

## 2019-10-27 DIAGNOSIS — R5382 Chronic fatigue, unspecified: Secondary | ICD-10-CM

## 2019-10-27 DIAGNOSIS — R0609 Other forms of dyspnea: Secondary | ICD-10-CM

## 2019-10-27 MED ORDER — BUSPIRONE HCL 5 MG PO TABS: ORAL_TABLET | ORAL | 1 refills | Status: DC

## 2019-10-27 NOTE — Telephone Encounter (Signed)
Patient reports no change in shortness of breath and no noticeable change in urine output.  Wanted to know if she should still come in for blood work Monday (BMET).  I adv to continue lasix 20 mg and plan to come for blood work Monday as scheduled.    She is not able to retrieve vm messages so if possible please call before 1:00 pm or after 4:00 pm she should be back home.   If she does not hear back today from our office, she will continue fluid pill daily and come in Monday for blood work as planned.

## 2019-10-27 NOTE — Progress Notes (Signed)
Subjective:    Patient ID: Mandy Durham, female    DOB: 08-Apr-1945, 74 y.o.   MRN: [id number]  DOS:  10/27/2019 Type of visit - description: Follow-up Since the last office visit, she saw cardiology, she reported DOE but denied chest pain (??) Started aspirin Work-up reviewed.  Anxiety: Did not like Lexapro.  Alternative?  Back pain about the same   Wt Readings from Last 3 Encounters:  10/27/19 135 lb 9.6 oz (61.5 kg)  10/26/19 138 lb (62.6 kg)  10/20/19 135 lb (61.2 kg)    Review of Systems See above   Past Medical History:  Diagnosis Date  . Anxiety state, unspecified   . Hyperlipidemia   . Hypertension   . Insomnia   . LBBB (left bundle branch block)    LHC in 2002 showed normal coronaries.   . Osteoarthrosis, unspecified whether generalized or localized, unspecified site   . Overweight(278.02)   . S/P placement of cardiac pacemaker    a. 02/21/16: Medtronic Advisa DR MRI SureScan model [id number] (serial number [id number])   . Serrated adenoma of colon 2007  . Symptomatic menopausal or female climacteric states   . Syncope and collapse    11/12: Holter 12/12 with rare PACS, HR range 64-140, average 82, no significant arrhythmias. Echo (1/13): EF 50-55%, mild LVH, septal-lateral dyssynchrony c/w LBBB. 3-week event monitor (1/13): No significant arrhythmia.     Past Surgical History:  Procedure Laterality Date  . COLONOSCOPY  2007  . EP IMPLANTABLE DEVICE N/A 02/21/2016   Procedure: Pacemaker Implant;  Surgeon: Will Meredith Leeds, MD;  Location: [facility];  Service: Cardiovascular;  Laterality: N/A;  . POLYPECTOMY      Allergies as of 10/27/2019   No Known Allergies     Medication List       Accurate as of October 27, 2019  2:20 PM. If you have any questions, ask your nurse or doctor.        acetaminophen 500 MG tablet Commonly known as: TYLENOL Take 500 mg by mouth every 6 (six) hours as needed.   aspirin EC 81 MG tablet Take 81 mg by mouth  daily. Swallow whole.   carbidopa-levodopa 25-100 MG tablet Commonly known as: SINEMET IR Take 1 tablet by mouth 3 (three) times daily.   escitalopram 5 MG tablet Commonly known as: Lexapro Take 1 tablet (5 mg total) by mouth at bedtime.   ezetimibe 10 MG tablet Commonly known as: ZETIA Take 1 tablet (10 mg total) by mouth daily.   furosemide 20 MG tablet Commonly known as: Lasix Take 1 tablet (20 mg total) by mouth daily.   ibandronate 150 MG tablet Commonly known as: BONIVA ibandronate 150 mg tablet  TAKE 1 TABLET BY MOUTH ONCE EVERY MONTH IN THE MORNING FOR 30 DAYS   losartan 50 MG tablet Commonly known as: COZAAR Take 1 tablet by mouth once daily   nitroGLYCERIN 0.4 MG SL tablet Commonly known as: NITROSTAT Place 1 tablet (0.4 mg total) under the tongue every 5 (five) minutes x 3 doses as needed for chest pain.   VITAMIN D PO Take 1 tablet by mouth 2 (two) times a week. Vitamin D          Objective:   Physical Exam BP 131/74   Pulse 87   Temp 98.3 F (36.8 C) (Oral)   Resp 18   Ht 5\' 6"  (1.676 m)   Wt 135 lb 9.6 oz (61.5 kg)   SpO2  97%   BMI 21.89 kg/m  General:   Well developed, NAD, BMI noted. HEENT:  Normocephalic . Face symmetric, atraumatic Skin: Not pale. Not jaundice Neurologic:  alert & oriented x 3 Speech normal, gait appropriate for age and unassisted Psych--  Cognition and judgment appear intact.  Cooperative with normal attention span and concentration.  Behavior appropriate. No anxious or depressed appearing.      Assessment      Assessment Prediabetes  HTN : amlodipine: edema 10/2018 Hyperlipidemia, statin intolerant Anxiety, insomnia:  Xanax  Menopausal; DEXA 2012 -1.5 @ gyn Recurrent Syncope --3:1 AV Block pacemaker 02/21/16 -- extensive cards eval before, DX with autonomic insufficiency ~ 02-2015 --on a  abd binder , pyridostigmine, rx midodrine 06-2015 per cards  Traumatic SAH (after a syncope),TBI--- admitted 01-2015 ,  d/c to a rehab unit temporarily -- was rx seroquel (aparently for ICU delirium) -- d/c 07-2015 --rx topamax when at rehab for post [facility]  HAs -- had issues w/b/b incontinence, better as of 07-2015 Tremors LUE, onset 9 -2017 Osteopneia: on Boniva, rx elsewhere DOE/CP: Low risk Myoview, echo done 10/20/2019  PLAN  Chest pain: Since the last visit, saw cardiology,  she denied chest pain to the cardiologist but admitted to [place]. They added aspirin, stress test low risk, echocardiogram showed some diastolic dysfunction, they Rx furosemide to see if to help with her DOE.  She started few days ago, so far no change, has a follow-up BMP next week with cardiology. Anxiety: Try Lexapro, give her "the jitters", would like to try something else, she is high risk for falls,plan  stay away from benzos for now, trial with BuSpar.  See AVS. Back pain: X-rays negative, still an issue, I asked if the pain was severe enough to see a specialist, she said no.  We agreed on Tylenol, heat, ice as needed. Tremor: Saw neurology yesterday, stable, continue Sinemet, follow-up 1 year Fatigue: Again a chronic issue, all tests are coming back okay, offered referral for OSA testing, declined, she does not think will be able to wear a mask. RTC 3 months   Time spent with the patient 32 minutes, I reviewed the cardiology work-up, we went over her fatigue and the negative work-up, we talk about possibly testing for sleep apnea.  This visit occurred during the SARS-CoV-2 public health emergency.  Safety protocols were in place, including screening questions prior to the visit, additional usage of staff PPE, and extensive cleaning of exam room while observing appropriate contact time as indicated for disinfecting solutions.

## 2019-10-27 NOTE — Patient Instructions (Addendum)
  Tylenol  500 mg OTC 2 tabs a day every 8 hours as needed for pain  Heat or cold compresses to your back as needed    Start buspirone 5 mg for anxiety: The first week take 1 tablet twice a day After then, 1 tablet 3 times a day   GO TO THE FRONT DESK, PLEASE SCHEDULE YOUR APPOINTMENTS Come back for a check up in 3 months

## 2019-10-27 NOTE — Telephone Encounter (Signed)
New Message:    Pt said she started on Furosemide on 10-23-19, she says she does not see any different. She would like for you to call by 11:30 if possible please. She says she have an appointment at 25.

## 2019-10-29 NOTE — Assessment & Plan Note (Signed)
Chest pain: Since the last visit, saw cardiology,  she denied chest pain to the cardiologist but admitted to [facility]. They added aspirin, stress test low risk, echocardiogram showed some diastolic dysfunction, they Rx furosemide to see if to help with her DOE.  She started few days ago, so far no change, has a follow-up BMP next week with cardiology. Anxiety: Try Lexapro, give her "the jitters", would like to try something else, she is high risk for falls,plan  stay away from benzos for now, trial with BuSpar.  See AVS. Back pain: X-rays negative, still an issue, I asked if the pain was severe enough to see a specialist, she said no.  We agreed on Tylenol, heat, ice as needed. Tremor: Saw neurology yesterday, stable, continue Sinemet, follow-up 1 year Fatigue: Again a chronic issue, all tests are coming back okay, offered referral for OSA testing, declined, she does not think will be able to wear a mask. RTC 3 months

## 2019-10-30 ENCOUNTER — Other Ambulatory Visit: Payer: PPO | Admitting: *Deleted

## 2019-10-30 ENCOUNTER — Other Ambulatory Visit: Payer: Self-pay

## 2019-10-30 DIAGNOSIS — Z79899 Other long term (current) drug therapy: Secondary | ICD-10-CM

## 2019-10-31 LAB — BASIC METABOLIC PANEL
BUN/Creatinine Ratio: 16 (ref 12–28)
BUN: 14 mg/dL (ref 8–27)
CO2: 28 mmol/L (ref 20–29)
Calcium: 9.4 mg/dL (ref 8.7–10.3)
Chloride: 100 mmol/L (ref 96–106)
Creatinine, Ser: 0.87 mg/dL (ref 0.57–1.00)
GFR calc Af Amer: 75 mL/min/{1.73_m2} (ref >59)
GFR calc non Af Amer: 65 mL/min/{1.73_m2} (ref >59)
Glucose: 100 mg/dL — ABNORMAL HIGH (ref 65–99)
Potassium: 4 mmol/L (ref 3.5–5.2)
Sodium: 143 mmol/L (ref 134–144)

## 2019-11-02 ENCOUNTER — Telehealth: Payer: Self-pay | Admitting: Physician Assistant

## 2019-11-02 DIAGNOSIS — R0609 Other forms of dyspnea: Secondary | ICD-10-CM

## 2019-11-02 DIAGNOSIS — I10 Essential (primary) hypertension: Secondary | ICD-10-CM

## 2019-11-02 DIAGNOSIS — R0600 Dyspnea, unspecified: Secondary | ICD-10-CM

## 2019-11-02 MED ORDER — FUROSEMIDE 20 MG PO TABS
40.0000 mg | ORAL_TABLET | Freq: Every day | ORAL | 3 refills | Status: DC
Start: 2019-11-02 — End: 2021-01-31

## 2019-11-02 MED ORDER — POTASSIUM CHLORIDE CRYS ER 10 MEQ PO TBCR: 10.0000 meq | EXTENDED_RELEASE_TABLET | Freq: Every day | ORAL | 3 refills | Status: DC

## 2019-11-02 NOTE — Telephone Encounter (Signed)
Pt c/o medication issue:  1. Name of Medication: furosemide (LASIX) 20 MG tablet  2. How are you currently taking this medication (dosage and times per day)? 1 tablet by mouth daily   3. Are you having a reaction (difficulty breathing--STAT)? No   4. What is your medication issue? Mandy Durham is calling stating taking one tablet a day has not caused her to go to the bathroom anymore than before. She states she took two tablets one day and noticed a better results. She is wanting to know if Mandy Durham is wanting her to continue taking one tablet up until her appt in September, or if she would like her to increaseit to two tablets per day. Please advise.

## 2019-11-02 NOTE — Telephone Encounter (Signed)
Received lab result note from Tommye Standard, [place]: Her labs are stable, kidney function and potassium with the addition of the furosemide. I got her note. If she felt like she noted more brisk urine output when she doubled the lasix to 40mg  daily, I would not object to that dose to see if her SOB improves.  Add potassium 30meq daily then please and repeat BMET in another week at 40mg  daily of furosemide. Ask her to let us klnow of this has helped her SOB.  I spoke with patient and reviewed advice with patient. She agrees to take furosemide 40 mg daily and start kdur 10 mEq daily. She understands to go to [facility] to pick up her medications. She is scheduled for lab appointment on 8/27. I advised her to call back next week to let us know if she is not feeling any better on the furosemide 40 mg. I advised that if she is feeling improvement in her SOB that she does not have to call back and can await our call with lab results. She verbalized understanding and repeated instructions back to me. She was grateful for the help.

## 2019-11-07 ENCOUNTER — Ambulatory Visit: Payer: PPO | Admitting: Physical Therapy

## 2019-11-10 ENCOUNTER — Other Ambulatory Visit: Payer: PPO

## 2019-11-14 ENCOUNTER — Other Ambulatory Visit: Payer: Self-pay

## 2019-11-14 ENCOUNTER — Encounter: Payer: Self-pay | Admitting: Physical Therapy

## 2019-11-14 ENCOUNTER — Ambulatory Visit: Payer: PPO | Attending: Gastroenterology | Admitting: Physical Therapy

## 2019-11-14 DIAGNOSIS — M6281 Muscle weakness (generalized): Secondary | ICD-10-CM | POA: Insufficient documentation

## 2019-11-14 DIAGNOSIS — R278 Other lack of coordination: Secondary | ICD-10-CM | POA: Diagnosis not present

## 2019-11-14 NOTE — Patient Instructions (Signed)
Toileting Techniques for Bowel Movements    An Evacuation/Defecation Plan   Here are the 4 basic points:  1. Lean forward enough for your elbows to rest on your knees 2. Support your feet on the floor or use a low stool if your feet don't touch the floor  3. Push out your belly as if you have swallowed a beach ball--you should feel a widening of your waist. "Belly Big, Belly Hard" 4. Open and relax your pelvic floor muscles, rather than tightening around the anus  While you are sitting on the toilet pay attention to the following areas:  Jaw and mouth position- relaxed not clenched  Angle of your hips - leaning slightly forward  Whether your feet touch the ground or not - should be flat and supported  Arm placement - rest against your thighs  Spine position - flat back  Waist  Breathing - exhale as you push (like blowing up a balloon or try using other sounds such as ahhhh, shhhhh, ohhhh or grrrrrrr)  Mandy Durham - hard and tight as you push  Anus (opening of the anal canal) - relaxed and open as you push  Anus - Tighten and lift pulling the muscle back in after you are done or if taking a break  If you are not successful after 10-15 minutes, try again later.  Avoid negative self-talk about your toileting experience.   Read this for more details and ask your PT if you need suggestions for adjustments or limitations:  1) Sitting on the toilet  a) Make sure your feet are supported - flat on the floor or step stool b) Many people find it effective to lean forward or raise their knees.  Propping your feet on a step stool (Squatty Potty is a brand name) can help the muscles around the anus to relax  c) When you lean forward, place your forearms on your thighs for support  2) Relaxing a) Breathe deeply and slowly in through your nose and out through your mouth. b) To become aware of how to relax your muscles, contracting and releasing muscles can be helpful.  Pull your pelvic floor  muscles in tightly by using the image of holding back gas, or closing around the anus (visualize making a circle smaller) and lifting the anus up and in.  Then release the muscles and your anus should drop down and feel open. Repeat 5 times ending with the feeling of relaxation. c) Keep your pelvic floor muscles relaxed; let your belly bulge out. d) The digestive tract starts at the mouth and ends at the anal opening, so be sure to relax both ends of the tube.  Place your tongue on the roof of your mouth with your teeth separated.  This helps relax your mouth and will help to relax the anus at the same time.  3) Emptying (defecation) a) Keep your pelvic floor and sphincter relaxed, then bulge your anal muscles.  Make the anal opening wide.  b) Stick your belly out as if you have swallowed a beach ball. c) Make your belly wall hard using your belly muscles while continuing to breathe. Doing this makes it easier to open your anus. d) Breath out and give a grunt (or try using other sounds such as ahhhh, shhhhh, ohhhh or grrrrrrr). e)  Can also try to act as if you are blowing up a balloon as you push  4) Finishing a) As you finish your bowel movement, pull the pelvic floor muscles  up and in.  This will leave your anus in the proper place rather than remaining pushed out and down. If you leave your anus pushed out and down, it will start to feel as though that is normal and give you incorrect signals about needing to have a bowel movement.

## 2019-11-14 NOTE — Therapy (Signed)
[facility] [address] [place], [place], [address] Phone: [phone]   Fax:  [phone]  Physical Therapy Evaluation  Patient Details  Name: Mandy Durham MRN: [id number] Date of Birth: 1945-06-01 Referring Provider (PT): Kathreen Cosier, MD   Encounter Date: 11/14/2019   PT End of Session - 11/14/19 1423    Visit Number 1    Date for PT Re-Evaluation 02/06/20    Authorization Type [employer]    PT Start Time 1425    PT Stop Time 1520    PT Time Calculation (min) 55 min    Activity Tolerance Patient tolerated treatment well    Behavior During Therapy [facility] for tasks assessed/performed           Past Medical History:  Diagnosis Date  . Anxiety state, unspecified   . Hyperlipidemia   . Hypertension   . Insomnia   . LBBB (left bundle branch block)    LHC in 2002 showed normal coronaries.   . Osteoarthrosis, unspecified whether generalized or localized, unspecified site   . Overweight(278.02)   . S/P placement of cardiac pacemaker    a. 02/21/16: Medtronic Advisa DR MRI SureScan model [id number] (serial number [id number])   . Serrated adenoma of colon 2007  . Symptomatic menopausal or female climacteric states   . Syncope and collapse    11/12: Holter 12/12 with rare PACS, HR range 64-140, average 82, no significant arrhythmias. Echo (1/13): EF 50-55%, mild LVH, septal-lateral dyssynchrony c/w LBBB. 3-week event monitor (1/13): No significant arrhythmia.     Past Surgical History:  Procedure Laterality Date  . COLONOSCOPY  2007  . EP IMPLANTABLE DEVICE N/A 02/21/2016   Procedure: Pacemaker Implant;  Surgeon: Will Meredith Leeds, MD;  Location: [place] [facility];  Service: Cardiovascular;  Laterality: N/A;  . POLYPECTOMY      There were no vitals filed for this visit.    Subjective Assessment - 11/14/19 1425    Subjective Pt is referred to PT for constipation and some occurrence of fecal  incontinence.  Symptoms have been present for many years.  Patient takes Miralax daily to have a daily BM.  Without it she goes every 4-5 days.  Identifies Type 4 on [place] Stool Chart.    Pertinent History PMH: has pacemaker, syncope with collapse resulting in SAH/TBI 2016, resting distal UE tremor    Diagnostic tests 05/2019 anorectal manometry: weak int/ext anal sphincter, dyssenergia, lumbar xray: mod spondylosis throughout, DDD L2-L5, lumbar curvature    Patient Stated Goals work on having BM without Miralax, get stronger in pelvic floor    Currently in Pain? Other (Comment)   SOB, ongoing cardiology assessment             [facility] PT Assessment - 11/14/19 0001      Assessment   Medical Diagnosis K59.00 (ICD-10-CM) - Constipation, unspecified constipation type M62.89 (ICD-10-CM) - PFD (pelvic floor dysfunction    Referring Provider (PT) Darrick Grinder, Mariam Dollar, MD    Onset Date/Surgical Date --   years   Next MD Visit --   a few weeks   Prior Therapy no      Precautions   Precautions Fall    Precaution Comments SAH/TBI from fall in past, pacemaker, tremor      Restrictions   Weight Bearing Restrictions No      Balance Screen   Has the patient fallen in the past 6 months No    Has the  patient had a decrease in activity level because of a fear of falling?  No    Is the patient reluctant to leave their home because of a fear of falling?  Yes      [place] Private residence    Living Arrangements Spouse/significant other    Type of [place] to enter    Entrance Stairs-Number of Steps 2    [place] One level    [place] None      Prior Function   Level of [place] Retired    Leisure no      Cognition   Overall Cognitive Status Within Functional Limits for tasks assessed    Memory Impaired      Observation/Other Assessments   Observations Lt UE distal  tremor      Functional Tests   Functional tests Sit to [employer]   Comments able to perform ind      Sit to Stand   Comments independent without UE      Posture/Postural Control   Posture/Postural Control Postural limitations    Postural Limitations Rounded Shoulders;Forward head;Flexed trunk;Increased thoracic kyphosis   scoliotic curvature present     ROM / Strength   AROM / PROM / Strength AROM;PROM;Strength      AROM   Overall AROM Comments trunk ROM limited approx 30% all planes      PROM   Overall PROM Comments bil hip ER and flexion limited Rt>Lt      Strength   Overall Strength Comments bil LEs 4-/5 throughout      Palpation   Palpation comment Rt lower abdominal quadrant tender to palpation                      Objective measurements completed on examination: See above findings.     Pelvic Floor Special Questions - 11/14/19 0001    Prior Pelvic/Prostate Exam Yes    Result Pelvic/Prostate Exam  3 years ago    Diagnostic Test/Procedure Performed Yes    Type Diagnostic/Procedure anorectal manometry    Date Diagnostic/Procedure 05/2019    Prior Pregnancies Yes    Number of Pregnancies 2    Number of Vaginal Deliveries 2    Any difficulty with labor and deliveries No    Currently Sexually Active No    History of sexually transmitted disease No    Urinary Leakage No    Urinary urgency No    Fecal incontinence Yes   every 7-10 days, soft   Fluid intake sodas, water, coffee, OJ    Caffeine beverages sodas, decaf coffee    Falling out feeling (prolapse) No    External Perineal Exam PT explained exam and Pt gave verbal consent    Skin Integrity Other    Skin Integrity other dry, thin tissues present labia minora, evidence of history of hemorrhoids    External Palpation Pt was not comfortable with this, just mild contact by PT to spread tissues to observe area    Pelvic Floor Internal Exam PT deferred due to Pt hesitation, explained for  Pt to think about for future appointments            [facility] Adult PT Treatment/Exercise - 11/14/19 0001      Self-Care   Self-Care Other Self-Care Comments    Other Self-Care Comments  toileting techniques, diaphragmatic breathing      Neuro Re-ed    Neuro Re-ed Details  diaphragmatic breathing 2-3 min/day, introduction of supine diaphragmatic breathing with exhale and bulge effort using gutteral exhale, TCs at abdomen      Exercises   Exercises Lumbar                  PT Education - 11/14/19 1527    Education Details toileting technique, diaphragmatic breathing 2-3 min/day    Person(s) Educated Patient    Methods Explanation;Demonstration;Verbal cues;Handout    Comprehension Verbalized understanding            PT Short Term Goals - 11/14/19 1711      PT SHORT TERM GOAL #1   Title Pt will be ind in initial HEP and self-care strategies for healthy PF and toileting.    Time 4    Period Weeks    Status New    Target Date 12/12/19      PT SHORT TERM GOAL #2   Title -      PT SHORT TERM GOAL #3   Title -      PT SHORT TERM GOAL #4   Title -      PT SHORT TERM GOAL #5   Title -             PT Long Term Goals - 11/14/19 1708      PT LONG TERM GOAL #1   Title Pt will report successful BMs of Type 3-4 at least 3 days a week without a need for Miralax    Baseline -    Time 8    Period Weeks    Status New    Target Date 01/09/20      PT LONG TERM GOAL #2   Title Pt will demo ability to perform PF bulge with exhale in good toileting posture for improved defecation.    Baseline -    Time 8    Period Weeks    Status New    Target Date 01/09/20      PT LONG TERM GOAL #3   Title Pt will be ind with bowel routine strategies for more naturally occurring BM facilitation.    Baseline -    Time 8    Period Weeks    Status New    Target Date 01/09/20      PT LONG TERM GOAL #4   Title Pt will report resolution of fecal incontinence to no more than  1x/2 weeks    Baseline -    Time 8    Period Weeks    Status New    Target Date 01/09/20      PT LONG TERM GOAL #5   Title -                  Plan - 11/14/19 1654    Clinical Impression Statement Pt referred to OPPT with diagnosis of constipation and PF dysfunction.  Pt has a soft, formed Type 4 bowel movement daily with daily use of Miralax.  She occassionally has a small amount of fecal incontinence.  Previous anorectal manometry performed 05/2019 found weakness of int/ext anal sphincters and dyssynergia.  Pt has complex medical history including a TBI from a fall in 2016.  She was unsure of why she was referred to PT and what PF PT would be, so much time was spent educating patient and explaining how an assessment for pelvic floor could  go.  She was hesitant so PT deferred full assessment today.  Globally Pt has postural dysfunction, limited trunk and hip mobility, decreased trunk and LE strength, and tenderness in Rt lower abdominal quadrant.  She felt comfortable allowing observation of pelvic region and PT was able to observe min ability to both lift and bulge her PF.  PT gave info on diaphragmatic breathing and began toileting techniques/education today. PT reinforced with Pt that nothing will be done in a session without her full understanding and consent but that without a full assessment progress may be more limited.  Pt will benefit from Pt education, neuro re-ed, and ther ex to improve mobility, postural strength, functional strength and pelvic floor performance for more optimal toileting habits and control.    Personal Factors and Comorbidities Age;Comorbidity 1;Comorbidity 3+;Comorbidity 2;Time since onset of injury/illness/exacerbation;Behavior Pattern    Comorbidities SAH/TBI, fall risk, DDD of lumbar region, pacemaker, resting tremor, cardiovascular compromise    Examination-Activity Limitations Toileting;Continence    Examination-Participation Restrictions Community  Activity;Interpersonal Relationship    Stability/Clinical Decision Making Stable/Uncomplicated    Clinical Decision Making Moderate    Rehab Potential Good    PT Frequency 1x / week    PT Duration 8 weeks    PT Treatment/Interventions ADLs/Self Care Home Management;Biofeedback;Cryotherapy;Electrical Stimulation;Moist Heat;Neuromuscular re-education;Balance training;Therapeutic exercise;Therapeutic activities;Functional mobility training;Patient/family education;Manual techniques;Passive range of motion;Joint Manipulations;Spinal Manipulations    PT Next Visit Plan does Pt want internal assessment (anorectal), review toileting handout and technique in sitting, abdominal massage, lower trunk rot    PT Home Exercise Plan toileting techniques, diaphragmatic breathing    Consulted and Agree with Plan of Care Patient           Patient will benefit from skilled therapeutic intervention in order to improve the following deficits and impairments:  Decreased coordination, Decreased range of motion, Impaired tone, Decreased endurance, Decreased activity tolerance, Decreased skin integrity, Postural dysfunction, Decreased strength, Decreased mobility, Impaired flexibility, Decreased balance  Visit Diagnosis: Other lack of coordination - Plan: PT plan of care cert/re-cert  Muscle weakness (generalized) - Plan: PT plan of care cert/re-cert     Problem List Patient Active Problem List   Diagnosis Date Noted  . 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 Functional dyspepsia 09/08/2017  . Slow transit constipation 09/08/2017  . S/P placement of cardiac pacemaker   . Mobitz II 02/20/2016  . Nystagmus, end-position 02/12/2016  . Dizziness and giddiness 02/12/2016  . Flapping tremor 02/12/2016  . Chronic fatigue 10/28/2015  . PCP NOTES >>>> 02/13/2015  . TBI (traumatic brain injury) ([place]) 01/28/2015  . Essential hypertension   . Acute post-traumatic headache, not intractable   . Skull fracture  ([place])   . Vertigo due to brain injury ([place])   . SAH (subarachnoid hemorrhage) ([place]) 01/21/2015  . Annual physical exam 09/07/2014  . Orthostatic hypotension 08/30/2014  . Acromioclavicular arthrosis 03/21/2014  . Allergic rhinitis 02/27/2014  . SI (sacroiliac) joint dysfunction 02/02/2014  . Gastrocnemius strain, left 12/11/2013  . Back pain 08/22/2013  . LBBB (left bundle branch block) 05/13/2013  . Shingles outbreak 12/15/2012  . Borderline diabetes   . Paresthesia of foot 07/07/2010  . INSOMNIA, CHRONIC, MILD 01/12/2008  . HLD (hyperlipidemia) 02/04/2007  . Disorder of bone and cartilage 02/04/2007  . Anxiety 10/12/2006  . Osteoarthritis 10/12/2006    Baruch Merl, PT 11/14/19 5:15 PM   [facility] [address] [place], [place], [address] Phone: [phone]   Fax:  [phone]  Name: Brieonna MAYGEN SIRICO MRN: [id number] Date of Birth: 11-Oct-1945

## 2019-11-16 ENCOUNTER — Other Ambulatory Visit: Payer: Self-pay

## 2019-11-16 ENCOUNTER — Other Ambulatory Visit: Payer: PPO

## 2019-11-16 DIAGNOSIS — I10 Essential (primary) hypertension: Secondary | ICD-10-CM

## 2019-11-16 DIAGNOSIS — R0600 Dyspnea, unspecified: Secondary | ICD-10-CM | POA: Diagnosis not present

## 2019-11-16 DIAGNOSIS — R0609 Other forms of dyspnea: Secondary | ICD-10-CM

## 2019-11-16 LAB — BASIC METABOLIC PANEL
BUN/Creatinine Ratio: 21 (ref 12–28)
BUN: 19 mg/dL (ref 8–27)
CO2: 24 mmol/L (ref 20–29)
Calcium: 9.4 mg/dL (ref 8.7–10.3)
Chloride: 101 mmol/L (ref 96–106)
Creatinine, Ser: 0.9 mg/dL (ref 0.57–1.00)
GFR calc Af Amer: 72 mL/min/{1.73_m2} (ref >59)
GFR calc non Af Amer: 63 mL/min/{1.73_m2} (ref >59)
Glucose: 84 mg/dL (ref 65–99)
Potassium: 3.6 mmol/L (ref 3.5–5.2)
Sodium: 141 mmol/L (ref 134–144)

## 2019-11-22 ENCOUNTER — Other Ambulatory Visit: Payer: Self-pay

## 2019-11-22 ENCOUNTER — Ambulatory Visit: Payer: PPO | Attending: Gastroenterology | Admitting: Physical Therapy

## 2019-11-22 DIAGNOSIS — M6281 Muscle weakness (generalized): Secondary | ICD-10-CM

## 2019-11-22 DIAGNOSIS — R278 Other lack of coordination: Secondary | ICD-10-CM

## 2019-11-22 NOTE — Therapy (Signed)
[facility] [address] [place], [place], [address] Phone: [phone]   Fax:  [phone]  Physical Therapy Treatment  Patient Details  Name: Mandy Durham MRN: [id number] Date of Birth: 1945-11-23 Referring Provider (PT): Kathreen Cosier, MD   Encounter Date: 11/22/2019   PT End of Session - 11/22/19 1238    Visit Number 2    Date for PT Re-Evaluation 02/06/20    Authorization Type [employer]    PT Start Time 1145    PT Stop Time 1230    PT Time Calculation (min) 45 min    Activity Tolerance Patient tolerated treatment well    Behavior During Therapy [facility] for tasks assessed/performed           Past Medical History:  Diagnosis Date  . Anxiety state, unspecified   . Hyperlipidemia   . Hypertension   . Insomnia   . LBBB (left bundle branch block)    LHC in 2002 showed normal coronaries.   . Osteoarthrosis, unspecified whether generalized or localized, unspecified site   . Overweight(278.02)   . S/P placement of cardiac pacemaker    a. 02/21/16: Medtronic Advisa DR MRI SureScan model [id number] (serial number [id number])   . Serrated adenoma of colon 2007  . Symptomatic menopausal or female climacteric states   . Syncope and collapse    11/12: Holter 12/12 with rare PACS, HR range 64-140, average 82, no significant arrhythmias. Echo (1/13): EF 50-55%, mild LVH, septal-lateral dyssynchrony c/w LBBB. 3-week event monitor (1/13): No significant arrhythmia.     Past Surgical History:  Procedure Laterality Date  . COLONOSCOPY  2007  . EP IMPLANTABLE DEVICE N/A 02/21/2016   Procedure: Pacemaker Implant;  Surgeon: Will Meredith Leeds, MD;  Location: [place] [facility];  Service: Cardiovascular;  Laterality: N/A;  . POLYPECTOMY      There were no vitals filed for this visit.   Subjective Assessment - 11/22/19 1148    Subjective Pt reports ongoing Miralax daily but was able to use toileting posture  and breathing to have greater ease with defecation.    Pertinent History PMH: has pacemaker, syncope with collapse resulting in SAH/TBI 2016, resting distal UE tremor    Diagnostic tests 05/2019 anorectal manometry: weak int/ext anal sphincter, dyssenergia, lumbar xray: mod spondylosis throughout, DDD L2-L5, lumbar curvature    Patient Stated Goals work on having BM without Miralax, get stronger in pelvic floor                             [facility] Adult PT Treatment/Exercise - 11/22/19 0001      Self-Care   Self-Care Other Self-Care Comments    Other Self-Care Comments  bowel routine, try for 3-5 min of counter march and household walking after either breakfast or lunch, abdominal massage      Neuro Re-ed    Neuro Re-ed Details  supine and seated diaphragmatic breathing, TCs, add bulge with exhale in sitting      Lumbar Exercises: Stretches   Single Knee to Chest Stretch Limitations 10x5 sec bil    Double Knee to Chest Stretch Limitations 10x5 sec bil    Lower Trunk Rotation Limitations 10x5 sec bil    Other Lumbar Stretch Exercise supine windshield wiper hips 10x5 sec holds      Manual Therapy   Manual Therapy Soft tissue mobilization    Soft tissue mobilization abdominal massage clockwise circles  and kneading along ILU                  PT Education - 11/22/19 1244    Education Details Access Code: [email or web address] + bowel routine    Person(s) Educated Patient    Methods Explanation;Demonstration;Handout;Verbal cues    Comprehension Verbalized understanding;Returned demonstration            PT Short Term Goals - 11/14/19 1711      PT SHORT TERM GOAL #1   Title Pt will be ind in initial HEP and self-care strategies for healthy PF and toileting.    Time 4    Period Weeks    Status New    Target Date 12/12/19      PT SHORT TERM GOAL #2   Title -      PT SHORT TERM GOAL #3   Title -      PT SHORT TERM GOAL #4   Title -      PT SHORT TERM GOAL #5    Title -             PT Long Term Goals - 11/14/19 1708      PT LONG TERM GOAL #1   Title Pt will report successful BMs of Type 3-4 at least 3 days a week without a need for Miralax    Baseline -    Time 8    Period Weeks    Status New    Target Date 01/09/20      PT LONG TERM GOAL #2   Title Pt will demo ability to perform PF bulge with exhale in good toileting posture for improved defecation.    Baseline -    Time 8    Period Weeks    Status New    Target Date 01/09/20      PT LONG TERM GOAL #3   Title Pt will be ind with bowel routine strategies for more naturally occurring BM facilitation.    Baseline -    Time 8    Period Weeks    Status New    Target Date 01/09/20      PT LONG TERM GOAL #4   Title Pt will report resolution of fecal incontinence to no more than 1x/2 weeks    Baseline -    Time 8    Period Weeks    Status New    Target Date 01/09/20      PT LONG TERM GOAL #5   Title -                 Plan - 11/22/19 1238    Clinical Impression Statement Pt returns for first follow up visit since initial evaluation.  She reported she was able to to use toileting techniques from eval to help her with defecation 2x since here.  She continues to use daily Miralax.  She was very tentative about having anorectal assessment at eval so PT reiterated that we have other options to treat consitipation which she wanted to try first.  PT gave supine PF and hip stretching/ROM routine, performed abdominal massage and gave instructions for self-massage, and initiated education on developing a bowel routine.  PT encouraged Pt to perform 3-5 min of household walking after either breakfast or lunch daily.  Pt will continue to benefit from skilled PT and education to improve symptoms of constipation.    Comorbidities SAH/TBI, fall risk, DDD of lumbar region, pacemaker, resting tremor, cardiovascular compromise  Rehab Potential Good    PT Frequency 1x / week    PT Duration 8  weeks    PT Treatment/Interventions ADLs/Self Care Home Management;Biofeedback;Cryotherapy;Electrical Stimulation;Moist Heat;Neuromuscular re-education;Balance training;Therapeutic exercise;Therapeutic activities;Functional mobility training;Patient/family education;Manual techniques;Passive range of motion;Joint Manipulations;Spinal Manipulations    PT Next Visit Plan f/u on bowel routine (household walk/march at counter x 3-5 min after either breakfast or lunch), abdominal massage f/u, review HEP    PT Home Exercise Plan Access Code: [email or web address] + bowel routine, toileting technique, diaphragmatic breathing    Consulted and Agree with Plan of Care Patient           Patient will benefit from skilled therapeutic intervention in order to improve the following deficits and impairments:     Visit Diagnosis: Other lack of coordination  Muscle weakness (generalized)     Problem List Patient Active Problem List   Diagnosis Date Noted  . 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 Functional dyspepsia 09/08/2017  . Slow transit constipation 09/08/2017  . S/P placement of cardiac pacemaker   . Mobitz II 02/20/2016  . Nystagmus, end-position 02/12/2016  . Dizziness and giddiness 02/12/2016  . Flapping tremor 02/12/2016  . Chronic fatigue 10/28/2015  . PCP NOTES >>>> 02/13/2015  . TBI (traumatic brain injury) ([place]) 01/28/2015  . Essential hypertension   . Acute post-traumatic headache, not intractable   . Skull fracture ([place])   . Vertigo due to brain injury ([place])   . SAH (subarachnoid hemorrhage) ([place]) 01/21/2015  . Annual physical exam 09/07/2014  . Orthostatic hypotension 08/30/2014  . Acromioclavicular arthrosis 03/21/2014  . Allergic rhinitis 02/27/2014  . SI (sacroiliac) joint dysfunction 02/02/2014  . Gastrocnemius strain, left 12/11/2013  . Back pain 08/22/2013  . LBBB (left bundle branch block) 05/13/2013  . Shingles outbreak 12/15/2012  . Borderline diabetes   . Paresthesia  of foot 07/07/2010  . INSOMNIA, CHRONIC, MILD 01/12/2008  . HLD (hyperlipidemia) 02/04/2007  . Disorder of bone and cartilage 02/04/2007  . Anxiety 10/12/2006  . Osteoarthritis 10/12/2006    Baruch Merl, PT 11/22/19 12:46 PM   [facility] [address], [place], [place], [address] Phone: [phone]   Fax:  [phone]  Name: Mandy Durham MRN: [id number] Date of Birth: 07-02-1945

## 2019-11-22 NOTE — Patient Instructions (Addendum)
Access Code: [email or web address] URL: [email or web address] Date: 11/22/2019 Prepared by: Venetia Night Marcel Gary  Exercises Hooklying Single Knee to Chest Stretch - 1 x daily - 7 x weekly - 1 sets - 10 reps - 5 hold Supine Double Knee to Chest - 1 x daily - 7 x weekly - 1 sets - 10 reps - 5 hold Supine Lower Trunk Rotation - 1 x daily - 7 x weekly - 1 sets - 10 reps - 5 hold Supine Hip Internal and External Rotation - 1 x daily - 7 x weekly - 1 sets - 10 reps - 5 hold Supine Abdominal Wall Massage - 1 x daily - 7 x weekly - 1 sets - 1 reps - 2-3 min hold     Strategies for Constipation - Developing a Bowel Routine  Bowels love routine.  Choose the best time of day to have a bowel movement.  Usually the best time of day for a bowel movement in 30 min to 1 hour after breakfast or lunch.  It can take a week or longer to transition to new bowel routines, but if you follow the guidelines below, you will start to retrain your system for more successful bowel routines.  Eat breakfast every morning to try to get things moving along.  Eating stimulates the bowels through turning on the gastrocolic reflex, which are the wave-like contractions of smooth muscle in your intestines to help move feces along.  Adding in a hot beverage with breakfast may help as well.  Think of your digestive tract as being a conveyor belt - new food comes in and pushes along "old food," with the end of the line being a bowel movement.  You may add 1/4 cup or 1/3 cup of prune juice every evening to help stimulate morning bowel function until you see more successful bowel function.  Create routine in both your timing and portions of intake of food and drink throughout the day.  Eat each of your meals at consistent times of the day.  For portions, the size of each meal may vary, but try to eat consistent portions each day at breakfast, lunch and dinner.  For example, you may have a small breakfast every day and a big lunch every day.  This is fine if it is a consistent pattern of intake.  With each meal, aim for at least 2 servings of fruits and vegetables and at least one serving of whole grains (whole grain cereal, brown rice, bran, whole wheat or rye bread, oatmeal) to get some fiber in your system.  These foods provide soft bulk for the bowel.  Drink water.  Aim for at least 8 large glasses of water a day.  Water helps the stool stay softer and easier to pass.  If you are adding more fiber in your diet, be sure to increase your water intake too.  Exercise can help move things along the digestive tract.  Exercise may be a walk, jog, yoga, exercise video, gym time, or even household walking or marching in place at the kitchen countertop.  Pairing exercise and breakfast as part of your morning routine may help establish a morning bowel routine.  Listen to your body's signals that it is time to go to the bathroom!  Our body has automatic reflexes that signal when it is time to have a bowel movement.  If you are establishing routines to help stimulate your bowels, be sure to allow time for toileting.  For example, if you typically have  a bowel movement after breakfast, get up early enough to eat AND empty your bowels before your schedule gets too busy or before it becomes inconvenient to access a bathroom.  If we routinely delay or hold back a bowel movement, these reflexes can stop signaling, leading to constipation which may become chronic (long-term).    Ensure proper toileting technique including posture, breathing and emptying strategies.*  Abdominal massage may also help stimulate your bowels and reduce the discomfort that accompanies constipation.*  *Your PT can educate you on toileting strategies and self-massage for your abdomen.  Your PT may also ask you to complete a food log and bowel diary for 1-3 days to help identify patterns and make suggestions for increased success in managing your symptoms.

## 2019-11-27 DIAGNOSIS — K5900 Constipation, unspecified: Secondary | ICD-10-CM | POA: Diagnosis not present

## 2019-11-28 ENCOUNTER — Ambulatory Visit: Payer: PPO | Admitting: Physical Therapy

## 2019-11-28 ENCOUNTER — Encounter: Payer: Self-pay | Admitting: Physical Therapy

## 2019-11-28 ENCOUNTER — Other Ambulatory Visit: Payer: Self-pay

## 2019-11-28 DIAGNOSIS — M6281 Muscle weakness (generalized): Secondary | ICD-10-CM

## 2019-11-28 DIAGNOSIS — R278 Other lack of coordination: Secondary | ICD-10-CM

## 2019-11-28 NOTE — Therapy (Signed)
[facility] [address], [place], [place], [address] Phone: [phone]   Fax:  [phone]  Physical Therapy Treatment  Patient Details  Name: Mandy Durham MRN: [id number] Date of Birth: 1946/01/17 Referring Provider (PT): Kathreen Cosier, MD   Encounter Date: 11/28/2019   PT End of Session - 11/28/19 1444    Visit Number 3    Date for PT Re-Evaluation 02/06/20    Authorization Type [employer]    PT Start Time 1445    PT Stop Time 1530    PT Time Calculation (min) 45 min    Activity Tolerance Patient tolerated treatment well    Behavior During Therapy [facility] for tasks assessed/performed           Past Medical History:  Diagnosis Date  . Anxiety state, unspecified   . Hyperlipidemia   . Hypertension   . Insomnia   . LBBB (left bundle branch block)    LHC in 2002 showed normal coronaries.   . Osteoarthrosis, unspecified whether generalized or localized, unspecified site   . Overweight(278.02)   . S/P placement of cardiac pacemaker    a. 02/21/16: Medtronic Advisa DR MRI SureScan model [id number] (serial number [id number])   . Serrated adenoma of colon 2007  . Symptomatic menopausal or female climacteric states   . Syncope and collapse    11/12: Holter 12/12 with rare PACS, HR range 64-140, average 82, no significant arrhythmias. Echo (1/13): EF 50-55%, mild LVH, septal-lateral dyssynchrony c/w LBBB. 3-week event monitor (1/13): No significant arrhythmia.     Past Surgical History:  Procedure Laterality Date  . COLONOSCOPY  2007  . EP IMPLANTABLE DEVICE N/A 02/21/2016   Procedure: Pacemaker Implant;  Surgeon: Will Meredith Leeds, MD;  Location: [facility];  Service: Cardiovascular;  Laterality: N/A;  . POLYPECTOMY      There were no vitals filed for this visit.   Subjective Assessment - 11/28/19 1445    Subjective My stomach is upset today.  I haven't had a BM in 3 days.  I would like  some advice on high fiber cereal options.    Pertinent History PMH: has pacemaker, syncope with collapse resulting in SAH/TBI 2016, resting distal UE tremor    Diagnostic tests 05/2019 anorectal manometry: weak int/ext anal sphincter, dyssenergia, lumbar xray: mod spondylosis throughout, DDD L2-L5, lumbar curvature    Patient Stated Goals work on having BM without Miralax, get stronger in pelvic floor                             [place] [facility] PT Treatment/Exercise - 11/28/19 0001      Self-Care   Self-Care Other Self-Care Comments    Other Self-Care Comments  significant time spent reviewing fiber options, how to read nutrition labels to identify high fiber foods, review of bowel routine, abdominal massage.  Initiated bowel and intake log and gave instructions on how to fill out      Manual Therapy   Manual Therapy Soft tissue mobilization    Soft tissue mobilization abdominal massage circles, kneading, vibration in supine and instrutions on how to perform to self                  PT Education - 11/28/19 1903    Education Details high fiber foods, fiber types and labels education, bowel and intake log instructions, abdominal massage review    Person(s) Educated  Patient    Methods Explanation;Handout;Verbal cues;Tactile cues    Comprehension Verbalized understanding            PT Short Term Goals - 11/14/19 1711      PT SHORT TERM GOAL #1   Title Pt will be ind in initial HEP and self-care strategies for healthy PF and toileting.    Time 4    Period Weeks    Status New    Target Date 12/12/19      PT SHORT TERM GOAL #2   Title -      PT SHORT TERM GOAL #3   Title -      PT SHORT TERM GOAL #4   Title -      PT SHORT TERM GOAL #5   Title -             PT Long Term Goals - 11/14/19 1708      PT LONG TERM GOAL #1   Title Pt will report successful BMs of Type 3-4 at least 3 days a week without a need for Miralax    Baseline -    Time 8     Period Weeks    Status New    Target Date 01/09/20      PT LONG TERM GOAL #2   Title Pt will demo ability to perform PF bulge with exhale in good toileting posture for improved defecation.    Baseline -    Time 8    Period Weeks    Status New    Target Date 01/09/20      PT LONG TERM GOAL #3   Title Pt will be ind with bowel routine strategies for more naturally occurring BM facilitation.    Baseline -    Time 8    Period Weeks    Status New    Target Date 01/09/20      PT LONG TERM GOAL #4   Title Pt will report resolution of fecal incontinence to no more than 1x/2 weeks    Baseline -    Time 8    Period Weeks    Status New    Target Date 01/09/20      PT LONG TERM GOAL #5   Title -                 Plan - 11/28/19 1905    Clinical Impression Statement Pt arrived today with report of abdominal upset secondary to constipation.  PT performed and reviewed abdominal massage with Pt.  Significant focus today on self-care instructions on bowel routine including education on types of fiber, examples of high fiber foods, and instructions on what to look for on food nutritional value labels.  PT and Pt discussed starting with small changes to her AM routine including finding a high fiber cereal and household walking/marching at counter to stimulate the gastrocolic reflex.  PT re-iterated value of abdominal massage.  Pt did note improved abdominal pain with gentle abdominal massage performed by PT end of session today.  Pt continues to decline any internal pelvic floor work.  She was not compliant with initial HEP secondary to not feeling well this week, so PT did not advance any further ther ex today.  Pt will continue to benefit from skilled PT along POC.    Comorbidities SAH/TBI, fall risk, DDD of lumbar region, pacemaker, resting tremor, cardiovascular compromise    Rehab Potential Good    PT Frequency 1x / week  PT Duration 8 weeks    PT Next Visit Plan did Pt find high  fiber cereal, establish morning bowel routine? Abdominal massage, review HEP, add prayer, piriformis stretch, bridge, open book, clam, standing march    PT Home Exercise Plan Access Code: [email or web address] + bowel routine, toileting technique, diaphragmatic breathing, fiber education    Consulted and Agree with Plan of Care Patient           Patient will benefit from skilled therapeutic intervention in order to improve the following deficits and impairments:     Visit Diagnosis: Other lack of coordination  Muscle weakness (generalized)     Problem List Patient Active Problem List   Diagnosis Date Noted  . 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[place]) 09/07/2018  . Functional dyspepsia 09/08/2017  . Slow transit constipation 09/08/2017  . S/P placement of cardiac pacemaker   . Mobitz II 02/20/2016  . Nystagmus, end-position 02/12/2016  . Dizziness and giddiness 02/12/2016  . Flapping tremor 02/12/2016  . Chronic fatigue 10/28/2015  . PCP NOTES >>>> 02/13/2015  . TBI (traumatic brain injury) ([place]) 01/28/2015  . Essential hypertension   . Acute post-traumatic headache, not intractable   . Skull fracture ([place])   . Vertigo due to brain injury ([place])   . SAH (subarachnoid hemorrhage) ([place]) 01/21/2015  . Annual physical exam 09/07/2014  . Orthostatic hypotension 08/30/2014  . Acromioclavicular arthrosis 03/21/2014  . Allergic rhinitis 02/27/2014  . SI (sacroiliac) joint dysfunction 02/02/2014  . Gastrocnemius strain, left 12/11/2013  . Back pain 08/22/2013  . LBBB (left bundle branch block) 05/13/2013  . Shingles outbreak 12/15/2012  . Borderline diabetes   . Paresthesia of foot 07/07/2010  . INSOMNIA, CHRONIC, MILD 01/12/2008  . HLD (hyperlipidemia) 02/04/2007  . Disorder of bone and cartilage 02/04/2007  . Anxiety 10/12/2006  . Osteoarthritis 10/12/2006   Baruch Merl, PT 11/28/19 7:12 PM   [facility] [address], [place], [place], [address] Phone: [phone]   Fax:  [phone]  Name: Mandy Durham MRN: [id number] Date of Birth: Apr 22, 1945

## 2019-11-28 NOTE — Patient Instructions (Signed)
Fiber Overview Dietary fiber is the part of plants that can't be digested. There are 2 kinds of dietary fiber: insoluble and soluble.    Insoluble fiber adds bulk to keep foods moving through the digestive system. Foods high in insoluble fiber can improve gut motility (wavelike contractions of the intestines) for those experiencing constipation. Soluble fiber holds water, which softens the stool for easy bowel movements. Foods high in soluble fiber can add bulk for those experiencing high frequency or loose stool/diarrhea.   Fiber is an important part of your diet, even though it passes through your body undigested and has no nutritional value.   A high fiber diet can:  promote regular bowel movements treat diverticular disease (inflammation of part of the intestine) and irritable bowel syndrome (abdominal pain, diarrhea, and constipation that come and go) promote improvement in hemorrhoids, constipation and fecal incontinence.  You should have at least 14 grams of fiber for every 1000 calories you eat every day. Read the label on every food package to find out how much fiber a serving of the food will provide. Foods containing more than 20% of the daily value of fiber per serving are considered high in fiber.  Soluble fiber foods: White rice Oatmeal Pasta Mashed potatoes Sweet potatoes Bananas Applesauce Hard cheeses Yogurt   Insoluble fiber foods: whole grain breads and cereals  nuts and seeds raw or dried fruits leafy vegetables like spinach and lettuce fruits and vegetables  Note: caffeine may trigger more bowel movements.  Anal Irritation: this may occur due to high frequency of bowel movements.  To reduce irritation: cleanse anal area with water only pat dry or use a cool hair dryer avoid soap in anal area do not wipe with toilet paper, or use toilet paper moistened with water use a squirt bottle filled with water to cleanse you can experiment with baby wipes or other  personal wipes if you wish, especially when out and using water is not convenient

## 2019-12-01 NOTE — Progress Notes (Signed)
Cardiology Office Note Date:  12/01/2019  Patient ID:  Mandy Durham, Mandy Durham Feb 16, 1946, MRN [id number] PCP:  Colon Branch, MD  Cardiologist: Dr. Aundra Dubin [phone])  Electrophysiologist:  Dr. Curt Bears    Chief Complaint:  f/u  History of Present Illness: Mandy Durham is a 74 y.o. female with history of HTN, HLD, LBBB (since around 2000, remote notes report LHC in 2002 showed normal coronaries), traumatic SAH (2016)advanced heart block w/PPM. She has a stuttering speech pattern and a R hand/forearm tremor she state since the time of her head injury.  She comes in today to be seen for Dr. Curt Bears, last seen by him march 2021, at that time reported fatigue, woke feeling well though quickly running out of energy leg felt heavy. Toprol was stopped and if no improvement suggested a f/u echo and re-eval of her EF  She was seen 10/04/19 by her PMD with c/o intermittent/exertional CP, EKG was unchanged from prior, given PRN s/l NTG, discussed with our DOD that day and comes today to follow up/further evaluation of her cp. Labs looked ok  I saw her 10/06/19 She reports for the last couple months getting SOB easily.  Reports that before she had no difficulties with her ADLs, gives an example of walking to her mail box and back without SOB and now has to catch her breath at the mailbox to walk back, and is SOB once back at the house.  When asked how far a walk it is she reports "not too far" She will get winded with some house chores, not others.  This very unusual for her. When asked about CP, she says no, just very SOB. Discussed if any heaviness, pressure, she says no. She mentions that she told Dr. Curt Bears last time here and he stopped one of her medicines but that did not help.  When I tell her that was end of March she is surprised, states she did not think she had been feeling poorly that long. She is certain and firm that she is not having CP, but the SOB does make her anxious No rest SOB, no dizzy  spells, near syncope or syncope  She denies any swelling ,bolating, no symptoms of orthopnea or PND. Noted VP only 2%, planned for an echo and stress test to evaluate her progressive DOE Stress test was low risk with no ischemia or prior infarct Echo looked OK, LVEF 50-55%, Anteroseptal and inferoseptal hypokinesis.  Left ventricular diastolic parameters are consistent with Grade I diastolic dysfunction (impaired relaxation). Elevated left ventricular end-diastolic pressure. RV OK, no significant VHD She was started on lasix and then uptitrated and planned for follow up f/u BMET was stable  TODAY She feels OK, thinks the lasix has helped, still has some days that she feels a little winded with activities, but all in all better. No CP, palpitations No rest SOB, no symptoms of PND or orthopnea, though wakes a few times to urinate. Since our last visit she has hd once or twice both while seated a very fleeting/sudden dizzy spell that she though she was blacking out.  Over a fast as it started.    Device information MDT dual chamber PPM implanted 02/21/2016  Past Medical History:  Diagnosis Date  . Anxiety state, unspecified   . Hyperlipidemia   . Hypertension   . Insomnia   . LBBB (left bundle branch block)    LHC in 2002 showed normal coronaries.   . Osteoarthrosis, unspecified whether generalized or localized,  unspecified site   . Overweight(278.02)   . S/P placement of cardiac pacemaker    a. 02/21/16: Medtronic Advisa DR MRI SureScan model [id number] (serial number [id number])   . Serrated adenoma of colon 2007  . Symptomatic menopausal or female climacteric states   . Syncope and collapse    11/12: Holter 12/12 with rare PACS, HR range 64-140, average 82, no significant arrhythmias. Echo (1/13): EF 50-55%, mild LVH, septal-lateral dyssynchrony c/w LBBB. 3-week event monitor (1/13): No significant arrhythmia.     Past Surgical History:  Procedure Laterality Date  . COLONOSCOPY   2007  . EP IMPLANTABLE DEVICE N/A 02/21/2016   Procedure: Pacemaker Implant;  Surgeon: Will Meredith Leeds, MD;  Location: [facility];  Service: Cardiovascular;  Laterality: N/A;  . POLYPECTOMY      Current Outpatient Medications  Medication Sig Dispense Refill  . acetaminophen (TYLENOL) 500 MG tablet Take 500 mg by mouth every 6 (six) hours as needed.    Marland Kitchen aspirin EC 81 MG tablet Take 81 mg by mouth daily. Swallow whole.    . busPIRone (BUSPAR) 5 MG tablet One tablet twice a day for 1 week, then 1 tablet 3 times a day 90 tablet 1  . carbidopa-levodopa (SINEMET IR) 25-100 MG tablet Take 1 tablet by mouth 3 (three) times daily. 270 tablet 3  . Cholecalciferol (VITAMIN D PO) Take 1 tablet by mouth 2 (two) times a week. Vitamin D    . ezetimibe (ZETIA) 10 MG tablet Take 1 tablet (10 mg total) by mouth daily. 90 tablet 3  . furosemide (LASIX) 20 MG tablet Take 2 tablets (40 mg total) by mouth daily. 90 tablet 3  . ibandronate (BONIVA) 150 MG tablet ibandronate 150 mg tablet  TAKE 1 TABLET BY MOUTH ONCE EVERY MONTH IN THE MORNING FOR 30 DAYS    . losartan (COZAAR) 50 MG tablet Take 1 tablet by mouth once daily 90 tablet 2  . nitroGLYCERIN (NITROSTAT) 0.4 MG SL tablet Place 1 tablet (0.4 mg total) under the tongue every 5 (five) minutes x 3 doses as needed for chest pain. (Patient not taking: Reported on 10/27/2019) 25 tablet 1  . potassium chloride (KLOR-CON M10) 10 MEQ tablet Take 1 tablet (10 mEq total) by mouth daily. 90 tablet 3   No current facility-administered medications for this visit.    Allergies:   Patient has no known allergies.   Social History:  The patient  reports that she has never smoked. She has never used smokeless tobacco. She reports that she does not drink alcohol and does not use drugs.   Family History:  The patient's family history includes Coronary artery disease in her mother; Dementia in her mother; Diabetes in her mother; Heart failure in her  mother.  ROS:  Please see the history of present illness.  All other systems are reviewed and otherwise negative.   PHYSICAL EXAM:  VS:  There were no vitals taken for this visit. BMI: There is no height or weight on file to calculate BMI. Well nourished, well developed, in no acute distress  HEENT: normocephalic, atraumatic  Neck: no JVD, carotid bruits or masses Cardiac:  RRR; soft SM, no rubs, or gallops Lungs:  CTA b/l, no wheezing, rhonchi or rales  Abd: soft, nontender MS: no deformity, thin body habitus, age appropriate atrophy Ext:  no edema  Skin: warm and dry, no rash Neuro:  She has a stuttered speech pattern and a resting R hand/forearm tremor, otherwise  no gross deficits appreciated Psych: euthymic mood, full affect  PPM site is stable, no tethering or discomfort   EKG:  Done 10/04/2019 by her PMD previously reviewed by myself shows  SR 89bpm, LBBB (unchanged)  PPM interrogation done today and reviewed by myself: Battery and lead measurements are good One NSVT on second VP 12%  10/20/2019: TTE IMPRESSIONS  1. Anteroseptal and inferoseptal hypokinesis. Left ventricular ejection  fraction, by estimation, is 50 to 55%. The left ventricle has low normal  function. The left ventricle demonstrates regional wall motion  abnormalities (see scoring diagram/findings  for description). Left ventricular diastolic parameters are consistent  with Grade I diastolic dysfunction (impaired relaxation). Elevated left  ventricular end-diastolic pressure.  2. Right ventricular systolic function is normal. The right ventricular  size is normal. There is normal pulmonary artery systolic pressure.  3. Left atrial size was mildly dilated.  4. The mitral valve is normal in structure. Trivial mitral valve  regurgitation. No evidence of mitral stenosis.  5. Tricuspid valve regurgitation is mild to moderate.  6. The aortic valve is tricuspid. Aortic valve regurgitation is not   visualized. No aortic stenosis is present.  7. The inferior vena cava is normal in size with greater than 50%  respiratory variability, suggesting right atrial pressure of 3 mmHg.     10/20/2019; lexiscan stress myoview  Nuclear stress EF: 50%. The left ventricular ejection fraction is mildly decreased (45-54%). Visually, the EF appears greater than the 50% computer generated EF  There was no ST segment deviation noted during stress.  This is a low risk study. There is no evidence of ischemia and no evidence of previous infarction.  The study is normal.    TTE 07/17/15  Review of the above records today demonstrates:  - Left ventricle: The cavity size was normal. There was severe focal basal hypertrophy of the septum. Systolic function was normal. The estimated ejection fraction was in the range of 50% to 55%. Anteroseptal hypokinesis. Doppler parameters are consistent with abnormal left ventricular relaxation (grade 1 diastolic dysfunction). The E/e&' ratio is between 8-15, suggesting indeterminate LV filling pressure. - Mitral valve: Mildly thickened leaflets . There was trivial regurgitation. - Left atrium: The atrium was normal in size. - Tricuspid valve: There was mild regurgitation. - Pulmonary arteries: PA peak pressure: 32 mm Hg (S). - Inferior vena cava: The vessel was normal in size. The respirophasic diameter changes were in the normal range (>= 50%), consistent with normal central venous pressure.  Recent Labs: 10/04/2019: ALT 10; Hemoglobin 13.4; Platelets 303.0; TSH 0.86 11/16/2019: BUN 19; Creatinine, Ser 0.90; Potassium 3.6; Sodium 141  No results found for requested labs within last 8760 hours.   CrCl cannot be calculated (Unknown ideal weight.).   Wt Readings from Last 3 Encounters:  10/27/19 135 lb 9.6 oz (61.5 kg)  10/26/19 138 lb (62.6 kg)  10/20/19 135 lb (61.2 kg)     Other studies reviewed: Additional studies/records reviewed  today include: summarized above  ASSESSMENT AND PLAN:  1. PPM     Intact function, no programming changes amde  2. HTN     Looks OK  3. Diastolic dysfunction    Less SOB with the addition of furosemide     4. Fleeting ?near syncope         She describes more then once, perhaps twice     She had one very brief NSVT, 5 beats, one second, would not expect this woyuld be symptomatic  Normal LVEF, low risk stress test     Will check BMET and mag     If she has any recurrent symptoms I asked her to call and we will have her send a remote        Disposition: I will see her back in 29mo, sooner if needed   Current medicines are reviewed at length with the patient today.  The patient did not have any concerns regarding medicines.  Venetia Night, PA-C 12/01/2019 9:33 AM     [address] [place] [address] [phone] (office)  [phone] (fax)

## 2019-12-04 ENCOUNTER — Other Ambulatory Visit: Payer: Self-pay

## 2019-12-04 ENCOUNTER — Ambulatory Visit: Payer: PPO | Admitting: Physician Assistant

## 2019-12-04 VITALS — BP 114/58 | HR 97 | Ht 65.0 in | Wt 132.0 lb

## 2019-12-04 DIAGNOSIS — I519 Heart disease, unspecified: Secondary | ICD-10-CM | POA: Diagnosis not present

## 2019-12-04 DIAGNOSIS — I447 Left bundle-branch block, unspecified: Secondary | ICD-10-CM

## 2019-12-04 DIAGNOSIS — Z79899 Other long term (current) drug therapy: Secondary | ICD-10-CM

## 2019-12-04 DIAGNOSIS — I441 Atrioventricular block, second degree: Secondary | ICD-10-CM | POA: Diagnosis not present

## 2019-12-04 DIAGNOSIS — Z950 Presence of cardiac pacemaker: Secondary | ICD-10-CM

## 2019-12-04 DIAGNOSIS — I5189 Other ill-defined heart diseases: Secondary | ICD-10-CM

## 2019-12-04 DIAGNOSIS — R55 Syncope and collapse: Secondary | ICD-10-CM | POA: Diagnosis not present

## 2019-12-04 DIAGNOSIS — I10 Essential (primary) hypertension: Secondary | ICD-10-CM | POA: Diagnosis not present

## 2019-12-04 NOTE — Patient Instructions (Signed)
Medication Instructions:   Your physician recommends that you continue on your current medications as directed. Please refer to the Current Medication list given to you today.   *If you need a refill on your cardiac medications before your next appointment, please call your pharmacy*   Lab Work: BMET AND [facility]     If you have labs (blood work) drawn today and your tests are completely normal, you will receive your results only by: Marland Kitchen MyChart Message (if you have MyChart) OR . A paper copy in the mail If you have any lab test that is abnormal or we need to change your treatment, we will call you to review the results.   Testing/Procedures: NONE ORDERED  TODAY   Follow-Up: At [facility], you and your health needs are our priority.  As part of our continuing mission to provide you with exceptional heart care, we have created designated Provider Care Teams.  These Care Teams include your primary Cardiologist (physician) and Advanced Practice Providers (APPs -  Physician Assistants and Nurse Practitioners) who all work together to provide you with the care you need, when you need it.  We recommend signing up for the patient portal called "MyChart".  Sign up information is provided on this After Visit Summary.  MyChart is used to connect with patients for Virtual Visits (Telemedicine).  Patients are able to view lab/test results, encounter notes, upcoming appointments, etc.  Non-urgent messages can be sent to your provider as well.   To learn more about what you can do with MyChart, go to [email or web address].    Your next appointment:   3 month(s)  The format for your next appointment:   In Person  Provider:   You may see Will Meredith Leeds, MD or one of the following Advanced Practice Providers on your designated Care Team:    Chanetta Marshall, NP  Tommye Standard, PA-C  Legrand Como "Oda Kilts, [place]    Other Instructions

## 2019-12-05 ENCOUNTER — Encounter: Payer: PPO | Admitting: Physical Therapy

## 2019-12-05 LAB — MAGNESIUM: Magnesium: 2.3 mg/dL (ref 1.6–2.3)

## 2019-12-05 LAB — BASIC METABOLIC PANEL
BUN/Creatinine Ratio: 18 (ref 12–28)
BUN: 13 mg/dL (ref 8–27)
CO2: 27 mmol/L (ref 20–29)
Calcium: 9.3 mg/dL (ref 8.7–10.3)
Chloride: 102 mmol/L (ref 96–106)
Creatinine, Ser: 0.73 mg/dL (ref 0.57–1.00)
GFR calc Af Amer: 93 mL/min/{1.73_m2} (ref >59)
GFR calc non Af Amer: 81 mL/min/{1.73_m2} (ref >59)
Glucose: 100 mg/dL — ABNORMAL HIGH (ref 65–99)
Potassium: 4.6 mmol/L (ref 3.5–5.2)
Sodium: 140 mmol/L (ref 134–144)

## 2019-12-07 ENCOUNTER — Ambulatory Visit (INDEPENDENT_AMBULATORY_CARE_PROVIDER_SITE_OTHER): Payer: PPO | Admitting: Emergency Medicine

## 2019-12-07 DIAGNOSIS — I441 Atrioventricular block, second degree: Secondary | ICD-10-CM

## 2019-12-07 DIAGNOSIS — I428 Other cardiomyopathies: Secondary | ICD-10-CM

## 2019-12-07 DIAGNOSIS — I429 Cardiomyopathy, unspecified: Secondary | ICD-10-CM

## 2019-12-08 LAB — CUP PACEART REMOTE DEVICE CHECK
Battery Remaining Longevity: 79 mo
Battery Voltage: 3.01 V
Brady Statistic AP VP Percent: 0.01 %
Brady Statistic AP VS Percent: 0.07 %
Brady Statistic AS VP Percent: 1.48 %
Brady Statistic AS VS Percent: 98.43 %
Brady Statistic RA Percent Paced: 0.09 %
Brady Statistic RV Percent Paced: 1.54 %
Date Time Interrogation Session: 20210924074235
Implantable Lead Implant Date: 20171208
Implantable Lead Implant Date: 20171208
Implantable Lead Location: 753859
Implantable Lead Location: 753860
Implantable Lead Model: 5076
Implantable Lead Model: 5076
Implantable Pulse Generator Implant Date: 20171208
Lead Channel Impedance Value: 418 Ohm
Lead Channel Impedance Value: 437 Ohm
Lead Channel Impedance Value: 494 Ohm
Lead Channel Impedance Value: 551 Ohm
Lead Channel Pacing Threshold Amplitude: 0.625 V
Lead Channel Pacing Threshold Amplitude: 0.625 V
Lead Channel Pacing Threshold Pulse Width: 0.4 ms
Lead Channel Pacing Threshold Pulse Width: 0.4 ms
Lead Channel Sensing Intrinsic Amplitude: 19.75 mV
Lead Channel Sensing Intrinsic Amplitude: 19.75 mV
Lead Channel Sensing Intrinsic Amplitude: 2.625 mV
Lead Channel Sensing Intrinsic Amplitude: 2.625 mV
Lead Channel Setting Pacing Amplitude: 2 V
Lead Channel Setting Pacing Amplitude: 2.5 V
Lead Channel Setting Pacing Pulse Width: 0.4 ms
Lead Channel Setting Sensing Sensitivity: 4 mV

## 2019-12-12 ENCOUNTER — Other Ambulatory Visit: Payer: Self-pay

## 2019-12-12 ENCOUNTER — Ambulatory Visit: Payer: PPO | Admitting: Physical Therapy

## 2019-12-12 ENCOUNTER — Encounter: Payer: Self-pay | Admitting: Physical Therapy

## 2019-12-12 DIAGNOSIS — M6281 Muscle weakness (generalized): Secondary | ICD-10-CM

## 2019-12-12 DIAGNOSIS — R278 Other lack of coordination: Secondary | ICD-10-CM | POA: Diagnosis not present

## 2019-12-12 NOTE — Progress Notes (Signed)
Remote pacemaker transmission.   

## 2019-12-12 NOTE — Therapy (Signed)
[facility] [address] [place], [place], [address] Phone: [phone]   Fax:  [phone]  Physical Therapy Treatment  Patient Details  Name: Mandy Durham MRN: [id number] Date of Birth: [birth year] Referring Provider (PT): Kathreen Cosier, MD   Encounter Date: 12/12/2019   PT End of Session - 12/12/19 1621    Visit Number 4    Date for PT Re-Evaluation 02/06/20    Authorization Type [employer]    PT Start Time 1530    PT Stop Time 1615    PT Time Calculation (min) 45 min    Activity Tolerance Patient tolerated treatment well    Behavior During Therapy [facility] for tasks assessed/performed           Past Medical History:  Diagnosis Date  . Anxiety state, unspecified   . Hyperlipidemia   . Hypertension   . Insomnia   . LBBB (left bundle branch block)    LHC in 2002 showed normal coronaries.   . Osteoarthrosis, unspecified whether generalized or localized, unspecified site   . Overweight(278.02)   . S/P placement of cardiac pacemaker    a. 02/21/16: Medtronic Advisa DR MRI SureScan model [id number] (serial number [id number])   . Serrated adenoma of colon 2007  . Symptomatic menopausal or female climacteric states   . Syncope and collapse    11/12: Holter 12/12 with rare PACS, HR range 64-140, average 82, no significant arrhythmias. Echo (1/13): EF 50-55%, mild LVH, septal-lateral dyssynchrony c/w LBBB. 3-week event monitor (1/13): No significant arrhythmia.     Past Surgical History:  Procedure Laterality Date  . COLONOSCOPY  2007  . EP IMPLANTABLE DEVICE N/A 02/21/2016   Procedure: Pacemaker Implant;  Surgeon: Will Meredith Leeds, MD;  Location: [facility];  Service: Cardiovascular;  Laterality: N/A;  . POLYPECTOMY      There were no vitals filed for this visit.   Subjective Assessment - 12/12/19 1533    Subjective I'm eating raisin bran most mornings of the week.  I had one morning  where I had a BM without using Milk of Magnesia.    Pertinent History PMH: has pacemaker, syncope with collapse resulting in SAH/TBI 2016, resting distal UE tremor    Diagnostic tests 05/2019 anorectal manometry: weak int/ext anal sphincter, dyssenergia, lumbar xray: mod spondylosis throughout, DDD L2-L5, lumbar curvature    Patient Stated Goals work on having BM without Milk of Magnesia, get stronger in pelvic floor                             [facility] Adult PT Treatment/Exercise - 12/12/19 0001      Self-Care   Self-Care Other Self-Care Comments    Other Self-Care Comments  review of bowel and diet log x 3 days-worth, advised pt incorporate more fuits/veggies and use whole grain bread high in fiber, self-instruction for abdominal massage circles, kneading, vibration for gas pain      Neuro Re-ed    Neuro Re-ed Details  review of toileting posture and breathing with VCs only for bulge instruction, Pt declines any perineal palpation or assessment      Manual Therapy   Manual Therapy Soft tissue mobilization    Soft tissue mobilization abdominal massage circles, kneading, vibration/tapping for gas pain, signif gut sounds with this                    PT  Short Term Goals - 11/14/19 1711      PT SHORT TERM GOAL #1   Title Pt will be ind in initial HEP and self-care strategies for healthy PF and toileting.    Time 4    Period Weeks    Status New    Target Date 12/12/19      PT SHORT TERM GOAL #2   Title -      PT SHORT TERM GOAL #3   Title -      PT SHORT TERM GOAL #4   Title -      PT SHORT TERM GOAL #5   Title -             PT Long Term Goals - 12/12/19 1720      PT LONG TERM GOAL #1   Title Pt will report successful BMs of Type 3-4 at least 3 days a week without a need for Miralax    Baseline 1 last week without aid    Status On-going      PT LONG TERM GOAL #2   Title Pt will demo ability to perform PF bulge with exhale in good toileting  posture for improved defecation.    Status On-going      PT LONG TERM GOAL #3   Title Pt will be ind with bowel routine strategies for more naturally occurring BM facilitation.    Status On-going      PT LONG TERM GOAL #4   Title Pt will report resolution of fecal incontinence to no more than 1x/2 weeks    Status New                 Plan - 12/12/19 1621    Clinical Impression Statement Pt has had one AM BM without use of Milk of Magnesia.  She has ongoing abdominal pain/tenderness with lack of gut noise at start of session. Signif gut noise today with PT performing abdominal massage techniques.  PT instructed Pt on self-massage.  Pt needs frequent review and re-inforcement secondary to memory challenges from TBI.  PT reviewed bowel and intake log today and noted a lack of fresh fruits and veggies in Pt's diet.  She has started eating raisin bran for breakfast.  PT encouraged her to work in more fruits and veggies and use high fiber whole grain bread for her toast.  Pt continues to decline any palpation, through clothing and unclothed, for PF coordination assessment, so PT is limited to visual observation through clothing at this time.  PT needed reviewing of toileting techniques today.  She will continue to benefit from reinforcement of strategies to help her manage constipation but will likely be limited in extent of treatment without consent for internal or external palpation/assessment, which PT explained is always ok.  PT will plan to review ongoing strategies next time given Pt's memory compromise and see if Pt has been more compliant with HEP.    Comorbidities SAH/TBI, fall risk, DDD of lumbar region, pacemaker, resting tremor, cardiovascular compromise    Rehab Potential Good    PT Frequency 1x / week    PT Duration 8 weeks    PT Next Visit Plan f/u on fruits/veg/whole grain bread, abd massage taught last time, review HEP (compliance?), SL and/or seated toileting tech breathing and  bulging practice with VCs    PT Home Exercise Plan Access Code: [email or web address] + bowel routine, toileting technique, diaphragmatic breathing, fiber education    Consulted and Agree with Plan of  Care Patient           Patient will benefit from skilled therapeutic intervention in order to improve the following deficits and impairments:     Visit Diagnosis: Other lack of coordination  Muscle weakness (generalized)     Problem List Patient Active Problem List   Diagnosis Date Noted  . 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[place]) 09/07/2018  . Functional dyspepsia 09/08/2017  . Slow transit constipation 09/08/2017  . S/P placement of cardiac pacemaker   . Mobitz II 02/20/2016  . Nystagmus, end-position 02/12/2016  . Dizziness and giddiness 02/12/2016  . Flapping tremor 02/12/2016  . Chronic fatigue 10/28/2015  . PCP NOTES >>>> 02/13/2015  . TBI (traumatic brain injury) ([place]) 01/28/2015  . Essential hypertension   . Acute post-traumatic headache, not intractable   . Skull fracture ([place])   . Vertigo due to brain injury ([place])   . SAH (subarachnoid hemorrhage) ([place]) 01/21/2015  . Annual physical exam 09/07/2014  . Orthostatic hypotension 08/30/2014  . Acromioclavicular arthrosis 03/21/2014  . Allergic rhinitis 02/27/2014  . SI (sacroiliac) joint dysfunction 02/02/2014  . Gastrocnemius strain, left 12/11/2013  . Back pain 08/22/2013  . LBBB (left bundle branch block) 05/13/2013  . Shingles outbreak 12/15/2012  . Borderline diabetes   . Paresthesia of foot 07/07/2010  . INSOMNIA, CHRONIC, MILD 01/12/2008  . HLD (hyperlipidemia) 02/04/2007  . Disorder of bone and cartilage 02/04/2007  . Anxiety 10/12/2006  . Osteoarthritis 10/12/2006    Baruch Merl, PT 12/12/19 5:21 PM   [facility] [address] [place], [place], [address] Phone: [phone]   Fax:  [phone]  Name: Mandy Durham MRN: [id number] Date of Birth:  10-18-1945

## 2019-12-19 ENCOUNTER — Encounter: Payer: PPO | Admitting: Physical Therapy

## 2019-12-22 ENCOUNTER — Telehealth: Payer: Self-pay | Admitting: Internal Medicine

## 2019-12-22 NOTE — Telephone Encounter (Signed)
error 

## 2019-12-26 ENCOUNTER — Other Ambulatory Visit: Payer: Self-pay

## 2019-12-26 ENCOUNTER — Encounter: Payer: Self-pay | Admitting: Physical Therapy

## 2019-12-26 ENCOUNTER — Ambulatory Visit: Payer: PPO | Attending: Gastroenterology | Admitting: Physical Therapy

## 2019-12-26 DIAGNOSIS — M6281 Muscle weakness (generalized): Secondary | ICD-10-CM | POA: Insufficient documentation

## 2019-12-26 DIAGNOSIS — R278 Other lack of coordination: Secondary | ICD-10-CM | POA: Diagnosis not present

## 2019-12-26 NOTE — Therapy (Signed)
[facility] [address], [place], [place], [address] Phone: [phone]   Fax:  [phone]  Physical Therapy Treatment  Patient Details  Name: Mandy Durham MRN: [id number] Date of Birth: Jul 31, 1945 Referring Provider (PT): Kathreen Cosier, MD   Encounter Date: 12/26/2019   PT End of Session - 12/26/19 1538    Visit Number 5    Date for PT Re-Evaluation 02/06/20    Authorization Type [employer]    PT Start Time 1535    PT Stop Time 1615    PT Time Calculation (min) 40 min    Activity Tolerance Patient tolerated treatment well    Behavior During Therapy [facility] for tasks assessed/performed           Past Medical History:  Diagnosis Date  . Anxiety state, unspecified   . Hyperlipidemia   . Hypertension   . Insomnia   . LBBB (left bundle branch block)    LHC in 2002 showed normal coronaries.   . Osteoarthrosis, unspecified whether generalized or localized, unspecified site   . Overweight(278.02)   . S/P placement of cardiac pacemaker    a. 02/21/16: Medtronic Advisa DR MRI SureScan model [id number] (serial number [id number])   . Serrated adenoma of colon 2007  . Symptomatic menopausal or female climacteric states   . Syncope and collapse    11/12: Holter 12/12 with rare PACS, HR range 64-140, average 82, no significant arrhythmias. Echo (1/13): EF 50-55%, mild LVH, septal-lateral dyssynchrony c/w LBBB. 3-week event monitor (1/13): No significant arrhythmia.     Past Surgical History:  Procedure Laterality Date  . COLONOSCOPY  2007  . EP IMPLANTABLE DEVICE N/A 02/21/2016   Procedure: Pacemaker Implant;  Surgeon: Will Meredith Leeds, MD;  Location: [facility];  Service: Cardiovascular;  Laterality: N/A;  . POLYPECTOMY      There were no vitals filed for this visit.   Subjective Assessment - 12/26/19 1535    Subjective I had to stop on the way home from PT last time to have a BM.  I guess  the abdominal massage helped.  I have been trying to do it at home but it isn't helping.  I don't know if I'm doing it right.  I do have some soreness in the lower central abdomen.  I've increased my yogurt, added nuts into cereal and gotten bread that has more fiber, I'm eating more hard cheese and kiwis.    Pertinent History PMH: has pacemaker, syncope with collapse resulting in SAH/TBI 2016, resting distal UE tremor    Diagnostic tests 05/2019 anorectal manometry: weak int/ext anal sphincter, dyssenergia, lumbar xray: mod spondylosis throughout, DDD L2-L5, lumbar curvature    Patient Stated Goals work on having BM without Milk of Magnesia, get stronger in pelvic floor    Currently in Pain? No/denies              [facility] PT Assessment - 12/26/19 0001      Assessment   Medical Diagnosis K59.00 (ICD-10-CM) - Constipation, unspecified constipation type M62.89 (ICD-10-CM) - PFD (pelvic floor dysfunction    Referring Provider (PT) Darrick Grinder, Mariam Dollar, MD    Onset Date/Surgical Date --   years   Next MD Visit --   a few weeks   Prior Therapy no      Palpation   Palpation comment reduced abdominal discomfort in Rt lower quadrant compared to evaluation  [facility] Adult PT Treatment/Exercise - 12/26/19 0001      Self-Care   Self-Care Other Self-Care Comments   verbal review of PF stretching and purpose of each exercise   Other Self-Care Comments  add a walk after breakfast and coffee to continue creating bowel routine, benefits of HEP and walking reviewed for improved gut motility and gastrocolic reflex      Exercises   Exercises Lumbar;Knee/Hip      Lumbar Exercises: Stretches   Other Lumbar Stretch Exercise stand tall overhead reach bil UEs SB Lt/Rt x 2x5 sec    Other Lumbar Stretch Exercise seated thoracic rotation 3x10 sec holds bil      Manual Therapy   Manual Therapy Soft tissue mobilization    Soft tissue mobilization abdominal massage kneading  and circles, review of Pt self-massage, 3-5 min daily                    PT Short Term Goals - 12/26/19 1720      PT SHORT TERM GOAL #1   Title Pt will be ind in initial HEP and self-care strategies for healthy PF and toileting.    Baseline progressing slowly, Pt needing frequent review due to memory troubles following TBI    Status On-going             PT Long Term Goals - 12/26/19 1721      PT LONG TERM GOAL #1   Title Pt will report successful BMs of Type 3-4 at least 3 days a week without a need for Milk of Magnesia    Baseline working on figuring out dosage    Status On-going      PT LONG TERM GOAL #2   Title Pt will demo ability to perform PF bulge with exhale in good toileting posture for improved defecation.    Baseline needs frequent review    Status On-going      PT LONG TERM GOAL #3   Title Pt will be ind with bowel routine strategies for more naturally occurring BM facilitation.    Baseline has established a morning routine for eating high fiber breakfast, coffee and now will add a walk for exercise paired with breakfast    Status On-going      PT LONG TERM GOAL #4   Title Pt will report resolution of fecal incontinence to no more than 1x/2 weeks    Status Achieved      PT LONG TERM GOAL #5   Title -                 Plan - 12/26/19 1723    Clinical Impression Statement Pt is making slow incremental progress toward goals.  She is working on establishing changes to her diet to incorporate more high fiber foods.  She is learning self-massage to abdominal wall and actually had to pull over to use the bathroom on her way home from her last appointment to have a BM after PT performed abdominal massage.  She has been educated on HEP for PF stretches and on toileting techniques but needs frequent review and reminders given memory troubles secondary to TBI.  She continues to decline internal work or even visual observation of pelvic region so PT is  limited in evaluating defection strategy and pelvic floor tension/tone.  Pt reports she may be ready to consent to this next visit as she would like to see more progress than what she has seen so far.  Pt will  continue to benefit from skilled PT to reinforce behavioral and self-care strategies, PF stretching, toileting techniques, and release techniques as needed with consent.    Comorbidities SAH/TBI, fall risk, DDD of lumbar region, pacemaker, resting tremor, cardiovascular compromise    Rehab Potential Good    PT Frequency 1x / week    PT Duration 8 weeks    PT Treatment/Interventions ADLs/Self Care Home Management;Biofeedback;Cryotherapy;Electrical Stimulation;Moist Heat;Neuromuscular re-education;Balance training;Therapeutic exercise;Therapeutic activities;Functional mobility training;Patient/family education;Manual techniques;Passive range of motion;Joint Manipulations;Spinal Manipulations    PT Next Visit Plan NuStep, is Pt comfortable with anorectal observation and assessement, abdominal massage, hip ROM, trunk rotation and elongation cues, practice bulging seated on yoga blocks    PT Home Exercise Plan Access Code: [email or web address] + bowel routine, toileting technique, diaphragmatic breathing, fiber education    Consulted and Agree with Plan of Care Patient           Patient will benefit from skilled therapeutic intervention in order to improve the following deficits and impairments:     Visit Diagnosis: Other lack of coordination - Plan: PT plan of care cert/re-cert  Muscle weakness (generalized) - Plan: PT plan of care cert/re-cert     Problem List Patient Active Problem List   Diagnosis Date Noted  . 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 Functional dyspepsia 09/08/2017  . Slow transit constipation 09/08/2017  . S/P placement of cardiac pacemaker   . Mobitz II 02/20/2016  . Nystagmus, end-position 02/12/2016  . Dizziness and giddiness 02/12/2016  . Flapping tremor 02/12/2016    . Chronic fatigue 10/28/2015  . PCP NOTES >>>> 02/13/2015  . TBI (traumatic brain injury) ([place]) 01/28/2015  . Essential hypertension   . Acute post-traumatic headache, not intractable   . Skull fracture ([place])   . Vertigo due to brain injury ([place])   . SAH (subarachnoid hemorrhage) ([place]) 01/21/2015  . Annual physical exam 09/07/2014  . Orthostatic hypotension 08/30/2014  . Acromioclavicular arthrosis 03/21/2014  . Allergic rhinitis 02/27/2014  . SI (sacroiliac) joint dysfunction 02/02/2014  . Gastrocnemius strain, left 12/11/2013  . Back pain 08/22/2013  . LBBB (left bundle branch block) 05/13/2013  . Shingles outbreak 12/15/2012  . Borderline diabetes   . Paresthesia of foot 07/07/2010  . INSOMNIA, CHRONIC, MILD 01/12/2008  . HLD (hyperlipidemia) 02/04/2007  . Disorder of bone and cartilage 02/04/2007  . Anxiety 10/12/2006  . Osteoarthritis 10/12/2006    Baruch Merl, PT 12/26/19 5:31 PM   [facility] [address] [place], [place], [address] Phone: [phone]   Fax:  [phone]  Name: Mandy Durham MRN: [id number] Date of Birth: Aug 21, 1945

## 2020-01-16 ENCOUNTER — Other Ambulatory Visit: Payer: Self-pay

## 2020-01-16 ENCOUNTER — Ambulatory Visit: Payer: PPO | Attending: Gastroenterology | Admitting: Physical Therapy

## 2020-01-16 ENCOUNTER — Encounter: Payer: Self-pay | Admitting: Physical Therapy

## 2020-01-16 DIAGNOSIS — M6281 Muscle weakness (generalized): Secondary | ICD-10-CM

## 2020-01-16 DIAGNOSIS — R278 Other lack of coordination: Secondary | ICD-10-CM

## 2020-01-16 NOTE — Therapy (Signed)
[facility] [address] [place], [place], [address] Phone: [phone]   Fax:  [phone]  Physical Therapy Treatment  Patient Details  Name: Mandy Durham MRN: [id number] Date of Birth: June 21, 1945 Referring Provider (PT): Kathreen Cosier, MD   Encounter Date: 01/16/2020   PT End of Session - 01/16/20 1523    Visit Number 6    Date for PT Re-Evaluation 02/20/20    Authorization Type [employer]    PT Start Time 1525    PT Stop Time 1615    PT Time Calculation (min) 50 min    Activity Tolerance Patient tolerated treatment well    Behavior During Therapy [facility] for tasks assessed/performed           Past Medical History:  Diagnosis Date  . Anxiety state, unspecified   . Hyperlipidemia   . Hypertension   . Insomnia   . LBBB (left bundle branch block)    LHC in 2002 showed normal coronaries.   . Osteoarthrosis, unspecified whether generalized or localized, unspecified site   . Overweight(278.02)   . S/P placement of cardiac pacemaker    a. 02/21/16: Medtronic Advisa DR MRI SureScan model [id number] (serial number [id number])   . Serrated adenoma of colon 2007  . Symptomatic menopausal or female climacteric states   . Syncope and collapse    11/12: Holter 12/12 with rare PACS, HR range 64-140, average 82, no significant arrhythmias. Echo (1/13): EF 50-55%, mild LVH, septal-lateral dyssynchrony c/w LBBB. 3-week event monitor (1/13): No significant arrhythmia.     Past Surgical History:  Procedure Laterality Date  . COLONOSCOPY  2007  . EP IMPLANTABLE DEVICE N/A 02/21/2016   Procedure: Pacemaker Implant;  Surgeon: Will Meredith Leeds, MD;  Location: [facility];  Service: Cardiovascular;  Laterality: N/A;  . POLYPECTOMY      There were no vitals filed for this visit.   Subjective Assessment - 01/16/20 1524    Subjective I am not sure I'm ever going to get better.  I am ready to allow you to  do more internal assessment to help me.  I am very frustrated.    Pertinent History PMH: has pacemaker, syncope with collapse resulting in SAH/TBI 2016, resting distal UE tremor    Diagnostic tests 05/2019 anorectal manometry: weak int/ext anal sphincter, dyssenergia, lumbar xray: mod spondylosis throughout, DDD L2-L5, lumbar curvature    Patient Stated Goals work on having BM without Milk of Magnesia, get stronger in pelvic floor    Currently in Pain? Yes    Pain Score 8     Pain Location Abdomen    Pain Orientation Mid;Lower    Pain Descriptors / Indicators Aching    Pain Relieving Factors Tylenol                             [facility] Adult PT Treatment/Exercise - 01/16/20 0001      Self-Care   Self-Care Other Self-Care Comments    Other Self-Care Comments  toileting posture for improved anterior pelvic tilt and straight back, elbows on thighs, feet elevated      Neuro Re-ed    Neuro Re-ed Details  with PT's finger in anorectal canal: diaphragmatic breathing, contract/relax/bulge      Lumbar Exercises: Stretches   Piriformis Stretch Right;Left;1 rep;30 seconds    Piriformis Stretch Limitations supine    Figure 4 Stretch 1 rep;30 seconds;With overpressure  Figure 4 Stretch Limitations bil    Other Lumbar Stretch Exercise seated large blue ball rollouts for trunk flexion x 10      Lumbar Exercises: Aerobic   Recumbent Bike L2 x 3' PT present to discuss symptoms      Manual Therapy   Manual Therapy Soft tissue mobilization    Manual therapy comments Pt gave ongoing verbal consent for internal anorectal release today    Soft tissue mobilization bil levator ani and puborectalis stretching and sweeping massage                    PT Short Term Goals - 12/26/19 1720      PT SHORT TERM GOAL #1   Title Pt will be ind in initial HEP and self-care strategies for healthy PF and toileting.    Baseline progressing slowly, Pt needing frequent review due to memory  troubles following TBI    Status On-going             PT Long Term Goals - 12/26/19 1721      PT LONG TERM GOAL #1   Title Pt will report successful BMs of Type 3-4 at least 3 days a week without a need for Milk of Magnesia    Baseline working on figuring out dosage    Status On-going      PT LONG TERM GOAL #2   Title Pt will demo ability to perform PF bulge with exhale in good toileting posture for improved defecation.    Baseline needs frequent review    Status On-going      PT LONG TERM GOAL #3   Title Pt will be ind with bowel routine strategies for more naturally occurring BM facilitation.    Baseline has established a morning routine for eating high fiber breakfast, coffee and now will add a walk for exercise paired with breakfast    Status On-going      PT LONG TERM GOAL #4   Title Pt will report resolution of fecal incontinence to no more than 1x/2 weeks    Status Achieved      PT LONG TERM GOAL #5   Title -                 Plan - 01/16/20 1746    Clinical Impression Statement Pt arrived frustrated and without improvement with symptoms of constipation.  She has not been compliant with HEP stretches but has continued to make small dietary changes that have not helped ease with BMs.  She continues to use enemas and Milk of Magnesia and has increasing abdominal pain and bloating of 7/10.  For the first time since starting PT she said she was ready for anorectal exam which revealed limited bulge, tight posterior PF, and difficulty generating pressure for defecation.  PT worked to release PF, used VCs and demo for improved breathing and bulging (some improvement with this), and reviewed seated posture for toileting to avoid sacral sitting.  PT reiterated the importance of her HEP PF stretches between sessions.  Pt will continue to benefit from PT to see if her comfort with internal work will yield improved results toward her goals.    Comorbidities SAH/TBI, fall risk,  DDD of lumbar region, pacemaker, resting tremor, cardiovascular compromise    PT Frequency 1x / week    PT Duration 8 weeks    PT Treatment/Interventions ADLs/Self Care Home Management;Biofeedback;Cryotherapy;Electrical Stimulation;Moist Heat;Neuromuscular re-education;Balance training;Therapeutic exercise;Therapeutic activities;Functional mobility training;Patient/family education;Manual techniques;Passive range of  motion;Joint Manipulations;Spinal Manipulations    PT Next Visit Plan NuStep, seated ball roll outs, PF stretches, internal release and neuro re-ed for bulge, f/u on anterior tilt sitting for toileting    PT Home Exercise Plan Access Code: [email or web address] + bowel routine, toileting technique, diaphragmatic breathing, fiber education    Consulted and Agree with Plan of Care Patient           Patient will benefit from skilled therapeutic intervention in order to improve the following deficits and impairments:     Visit Diagnosis: Other lack of coordination  Muscle weakness (generalized)     Problem List Patient Active Problem List   Diagnosis Date Noted  . 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[place]) 09/07/2018  . Functional dyspepsia 09/08/2017  . Slow transit constipation 09/08/2017  . S/P placement of cardiac pacemaker   . Mobitz II 02/20/2016  . Nystagmus, end-position 02/12/2016  . Dizziness and giddiness 02/12/2016  . Flapping tremor 02/12/2016  . Chronic fatigue 10/28/2015  . PCP NOTES >>>> 02/13/2015  . TBI (traumatic brain injury) ([place]) 01/28/2015  . Essential hypertension   . Acute post-traumatic headache, not intractable   . Skull fracture ([place])   . Vertigo due to brain injury ([place])   . SAH (subarachnoid hemorrhage) ([place]) 01/21/2015  . Annual physical exam 09/07/2014  . Orthostatic hypotension 08/30/2014  . Acromioclavicular arthrosis 03/21/2014  . Allergic rhinitis 02/27/2014  . SI (sacroiliac) joint dysfunction 02/02/2014  . Gastrocnemius strain, left 12/11/2013  . Back  pain 08/22/2013  . LBBB (left bundle branch block) 05/13/2013  . Shingles outbreak 12/15/2012  . Borderline diabetes   . Paresthesia of foot 07/07/2010  . INSOMNIA, CHRONIC, MILD 01/12/2008  . HLD (hyperlipidemia) 02/04/2007  . Disorder of bone and cartilage 02/04/2007  . Anxiety 10/12/2006  . Osteoarthritis 10/12/2006    Baruch Merl, PT 01/16/20 5:52 PM   [facility] [address] [place], [place], [address] Phone: [phone]   Fax:  [phone]  Name: Aleeya CHARLES NIESE MRN: [id number] Date of Birth: [birth year]

## 2020-01-23 ENCOUNTER — Other Ambulatory Visit: Payer: Self-pay

## 2020-01-23 ENCOUNTER — Ambulatory Visit: Payer: PPO | Admitting: Physical Therapy

## 2020-01-23 ENCOUNTER — Encounter: Payer: Self-pay | Admitting: Physical Therapy

## 2020-01-23 DIAGNOSIS — R278 Other lack of coordination: Secondary | ICD-10-CM | POA: Diagnosis not present

## 2020-01-23 DIAGNOSIS — M6281 Muscle weakness (generalized): Secondary | ICD-10-CM

## 2020-01-23 NOTE — Therapy (Signed)
[facility] [address], [place], [place], [address] Phone: [phone]   Fax:  [phone]  Physical Therapy Treatment  Patient Details  Name: Mandy Durham MRN: [id number] Date of Birth: 14-Apr-1945 Referring Provider (PT): Kathreen Cosier, MD   Encounter Date: 01/23/2020   PT End of Session - 01/23/20 1711    Visit Number 7    Date for PT Re-Evaluation 02/20/20    Authorization Type [employer]    PT Start Time 1530    PT Stop Time 1615    PT Time Calculation (min) 45 min    Activity Tolerance Patient tolerated treatment well    Behavior During Therapy [facility] for tasks assessed/performed           Past Medical History:  Diagnosis Date  . Anxiety state, unspecified   . Hyperlipidemia   . Hypertension   . Insomnia   . LBBB (left bundle branch block)    LHC in 2002 showed normal coronaries.   . Osteoarthrosis, unspecified whether generalized or localized, unspecified site   . Overweight(278.02)   . S/P placement of cardiac pacemaker    a. 02/21/16: Medtronic Advisa DR MRI SureScan model [id number] (serial number [id number])   . Serrated adenoma of colon 2007  . Symptomatic menopausal or female climacteric states   . Syncope and collapse    11/12: Holter 12/12 with rare PACS, HR range 64-140, average 82, no significant arrhythmias. Echo (1/13): EF 50-55%, mild LVH, septal-lateral dyssynchrony c/w LBBB. 3-week event monitor (1/13): No significant arrhythmia.     Past Surgical History:  Procedure Laterality Date  . COLONOSCOPY  2007  . EP IMPLANTABLE DEVICE N/A 02/21/2016   Procedure: Pacemaker Implant;  Surgeon: Will Meredith Leeds, MD;  Location: [facility];  Service: Cardiovascular;  Laterality: N/A;  . POLYPECTOMY      There were no vitals filed for this visit.   Subjective Assessment - 01/23/20 1532    Subjective I'm still not able to go to the bathroom without Milk of Magnesia.     Pertinent History PMH: has pacemaker, syncope with collapse resulting in SAH/TBI 2016, resting distal UE tremor    Diagnostic tests 05/2019 anorectal manometry: weak int/ext anal sphincter, dyssenergia, lumbar xray: mod spondylosis throughout, DDD L2-L5, lumbar curvature    Patient Stated Goals work on having BM without Milk of Magnesia, get stronger in pelvic floor    Currently in Pain? Yes    Pain Score 3    gets up to 8/10 when need to go to the bathroom   Pain Location Abdomen    Pain Orientation Mid;Lower    Pain Descriptors / Indicators Aching    Pain Type Chronic pain    Pain Onset More than a month ago    Pain Frequency Intermittent    Aggravating Factors  when constipated    Pain Relieving Factors use of Milk of Magnesia    Effect of Pain on Daily Activities toileting                             [facility] Adult PT Treatment/Exercise - 01/23/20 0001      Self-Care   Self-Care Other Self-Care Comments    Other Self-Care Comments  glycerin suppositories when stool is at rectum and can't defecate      Neuro Re-ed    Neuro Re-ed Details  supine, SL and sitting: VCs and TCs  for abdominal breathing and exhale/bulge to generate improved pressure for defecation      Lumbar Exercises: Stretches   Lower Trunk Rotation Limitations 10 reps A/ROM    Other Lumbar Stretch Exercise seated large blue ball rollouts for trunk flexion x 10      Lumbar Exercises: Aerobic   Recumbent Bike L2 x 3' PT present to discuss symptoms                    PT Short Term Goals - 12/26/19 1720      PT SHORT TERM GOAL #1   Title Pt will be ind in initial HEP and self-care strategies for healthy PF and toileting.    Baseline progressing slowly, Pt needing frequent review due to memory troubles following TBI    Status On-going             PT Long Term Goals - 12/26/19 1721      PT LONG TERM GOAL #1   Title Pt will report successful BMs of Type 3-4 at least 3 days a week  without a need for Milk of Magnesia    Baseline working on figuring out dosage    Status On-going      PT LONG TERM GOAL #2   Title Pt will demo ability to perform PF bulge with exhale in good toileting posture for improved defecation.    Baseline needs frequent review    Status On-going      PT LONG TERM GOAL #3   Title Pt will be ind with bowel routine strategies for more naturally occurring BM facilitation.    Baseline has established a morning routine for eating high fiber breakfast, coffee and now will add a walk for exercise paired with breakfast    Status On-going      PT LONG TERM GOAL #4   Title Pt will report resolution of fecal incontinence to no more than 1x/2 weeks    Status Achieved      PT LONG TERM GOAL #5   Title -                 Plan - 01/23/20 1712    Clinical Impression Statement Pt continues to have difficulty with defecation.  She has trouble coordinating abdominal breathing and bulge with exhale.  Signif time spent today with VCs, TCs and repeated practice in multiple positions.  Pt felt most awareness in supine hooklying.  She tended to flex trunk and tuck pelvis with seated attempts to bulge so PT cued "be as un-ladylike as you can" sticking your tailbone out and back and your belly out as you push and exhale.  PT also discussed glycerin suppository use to try when stool is at anorectal opening and she can't get it out.  Slow progress secondary to memory trouble from TBI but Pt is showing some carry over between visits.  Continue along POC with ongoing neuro re-ed for toileting.    Comorbidities SAH/TBI, fall risk, DDD of lumbar region, pacemaker, resting tremor, cardiovascular compromise    Rehab Potential Good    PT Frequency 1x / week    PT Duration 8 weeks    PT Treatment/Interventions ADLs/Self Care Home Management;Biofeedback;Cryotherapy;Electrical Stimulation;Moist Heat;Neuromuscular re-education;Balance training;Therapeutic exercise;Therapeutic  activities;Functional mobility training;Patient/family education;Manual techniques;Passive range of motion;Joint Manipulations;Spinal Manipulations    PT Next Visit Plan did Pt get glycerin suppositories, do abdominal massage, NuStep, seated ball roll outs, PF stretches, internal release and neuro re-ed for bulge, f/u on anterior  tilt sitting for toileting    PT Home Exercise Plan Access Code: [email or web address] + bowel routine, toileting technique, diaphragmatic breathing, fiber education    Consulted and Agree with Plan of Care Patient           Patient will benefit from skilled therapeutic intervention in order to improve the following deficits and impairments:     Visit Diagnosis: Other lack of coordination  Muscle weakness (generalized)     Problem List Patient Active Problem List   Diagnosis Date Noted  . 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[place]) 09/07/2018  . Functional dyspepsia 09/08/2017  . Slow transit constipation 09/08/2017  . S/P placement of cardiac pacemaker   . Mobitz II 02/20/2016  . Nystagmus, end-position 02/12/2016  . Dizziness and giddiness 02/12/2016  . Flapping tremor 02/12/2016  . Chronic fatigue 10/28/2015  . PCP NOTES >>>> 02/13/2015  . TBI (traumatic brain injury) ([place]) 01/28/2015  . Essential hypertension   . Acute post-traumatic headache, not intractable   . Skull fracture ([place])   . Vertigo due to brain injury ([place])   . SAH (subarachnoid hemorrhage) ([place]) 01/21/2015  . Annual physical exam 09/07/2014  . Orthostatic hypotension 08/30/2014  . Acromioclavicular arthrosis 03/21/2014  . Allergic rhinitis 02/27/2014  . SI (sacroiliac) joint dysfunction 02/02/2014  . Gastrocnemius strain, left 12/11/2013  . Back pain 08/22/2013  . LBBB (left bundle branch block) 05/13/2013  . Shingles outbreak 12/15/2012  . Borderline diabetes   . Paresthesia of foot 07/07/2010  . INSOMNIA, CHRONIC, MILD 01/12/2008  . HLD (hyperlipidemia) 02/04/2007  . Disorder of bone and cartilage  02/04/2007  . Anxiety 10/12/2006  . Osteoarthritis 10/12/2006    Baruch Merl, PT 01/23/20 5:18 PM   [facility] [address], [place], [place], [address] Phone: [phone]   Fax:  [phone]  Name: Mandy Durham MRN: [id number] Date of Birth: [birth year]

## 2020-01-30 ENCOUNTER — Encounter: Payer: Self-pay | Admitting: Internal Medicine

## 2020-01-30 ENCOUNTER — Ambulatory Visit: Payer: PPO | Admitting: Internal Medicine

## 2020-01-30 DIAGNOSIS — Z0289 Encounter for other administrative examinations: Secondary | ICD-10-CM

## 2020-01-31 ENCOUNTER — Encounter: Payer: PPO | Admitting: Physical Therapy

## 2020-02-06 ENCOUNTER — Ambulatory Visit: Payer: PPO | Admitting: Physical Therapy

## 2020-02-06 ENCOUNTER — Encounter: Payer: Self-pay | Admitting: Physical Therapy

## 2020-02-06 ENCOUNTER — Other Ambulatory Visit: Payer: Self-pay

## 2020-02-06 DIAGNOSIS — M6281 Muscle weakness (generalized): Secondary | ICD-10-CM

## 2020-02-06 DIAGNOSIS — R278 Other lack of coordination: Secondary | ICD-10-CM | POA: Diagnosis not present

## 2020-02-06 NOTE — Therapy (Signed)
[facility] [address] [place], [place], [address] Phone: [phone]   Fax:  [phone]  Physical Therapy Treatment  Patient Details  Name: Mandy Durham MRN: [id number] Date of Birth: 03-16-1946 Referring Provider (PT): Kathreen Cosier, MD   Encounter Date: 02/06/2020   PT End of Session - 02/06/20 1538    Visit Number 8    Date for PT Re-Evaluation 02/20/20    Authorization Type [employer]    PT Start Time 1530    PT Stop Time 1600   short session, d/c   PT Time Calculation (min) 30 min    Activity Tolerance Patient tolerated treatment well    Behavior During Therapy [facility] for tasks assessed/performed           Past Medical History:  Diagnosis Date  . Anxiety state, unspecified   . Hyperlipidemia   . Hypertension   . Insomnia   . LBBB (left bundle branch block)    LHC in 2002 showed normal coronaries.   . Osteoarthrosis, unspecified whether generalized or localized, unspecified site   . Overweight(278.02)   . S/P placement of cardiac pacemaker    a. 02/21/16: Medtronic Advisa DR MRI SureScan model [id number] (serial number [id number])   . Serrated adenoma of colon 2007  . Symptomatic menopausal or female climacteric states   . Syncope and collapse    11/12: Holter 12/12 with rare PACS, HR range 64-140, average 82, no significant arrhythmias. Echo (1/13): EF 50-55%, mild LVH, septal-lateral dyssynchrony c/w LBBB. 3-week event monitor (1/13): No significant arrhythmia.     Past Surgical History:  Procedure Laterality Date  . COLONOSCOPY  2007  . EP IMPLANTABLE DEVICE N/A 02/21/2016   Procedure: Pacemaker Implant;  Surgeon: Will Meredith Leeds, MD;  Location: [facility];  Service: Cardiovascular;  Laterality: N/A;  . POLYPECTOMY      There were no vitals filed for this visit.   Subjective Assessment - 02/06/20 1528    Subjective I do not feel like this is helping. I am still  having a really hard time going to the bathroom.  I got the glycerin suppositories but haven't used them yet.    Pertinent History PMH: has pacemaker, syncope with collapse resulting in SAH/TBI 2016, resting distal UE tremor    Diagnostic tests 05/2019 anorectal manometry: weak int/ext anal sphincter, dyssenergia, lumbar xray: mod spondylosis throughout, DDD L2-L5, lumbar curvature    Patient Stated Goals work on having BM without Milk of Magnesia, get stronger in pelvic floor    Currently in Pain? Yes    Pain Score 2     Pain Location Abdomen    Pain Orientation Mid;Lower    Pain Descriptors / Indicators Aching    Pain Onset More than a month ago    Pain Frequency Intermittent    Aggravating Factors  when consipated    Pain Relieving Factors use of Milk of Magnesia    Effect of Pain on Daily Activities toileting                             [facility] Adult PT Treatment/Exercise - 02/06/20 0001      Neuro Re-ed    Neuro Re-ed Details  SL abdominal breathing with TCs and VCs, SL contract/relax/bulge with VCs and TCs with PT's finger in anorectal canal  PT Short Term Goals - 02/06/20 1605      PT SHORT TERM GOAL #1   Title Pt will be ind in initial HEP and self-care strategies for healthy PF and toileting.    Status Achieved      PT SHORT TERM GOAL #4   Baseline -             PT Long Term Goals - 02/06/20 1606      PT LONG TERM GOAL #1   Title Pt will report successful BMs of Type 3-4 at least 3 days a week without a need for Milk of Magnesia    Status Not Met      PT LONG TERM GOAL #2   Title Pt will demo ability to perform PF bulge with exhale in good toileting posture for improved defecation.    Status Not Met      PT LONG TERM GOAL #3   Title Pt will be ind with bowel routine strategies for more naturally occurring BM facilitation.    Status Partially Met      PT LONG TERM GOAL #4   Title Pt will report resolution of  fecal incontinence to no more than 1x/2 weeks    Status Achieved                 Plan - 02/06/20 1600    Clinical Impression Statement Pt reports no improvement with ease of BMs since starting PT.  She has great difficulty with diaphragmatic breathing and bulging of PF.  She has poor awareness and sensation in anorectal canal making it difficulty for her to generate inlet pressure for defecation.  She has less dysnnergia since starting PT but without ability to generate inlet pressure and bulge she is unable to have a BM without use of Milk of Magnesia.  PT has worked with her on neuro re-ed with VCs and TCs, PF stretching, breathing, diet assessment/changes to include more fiber.  Neuro re-ed may be an extra challenge for her secondary to TBI.  PT recommends d/c at this time due to lack of progress.    Comorbidities SAH/TBI, fall risk, DDD of lumbar region, pacemaker, resting tremor, cardiovascular compromise    Rehab Potential Good    PT Frequency 1x / week    PT Duration 8 weeks    PT Treatment/Interventions ADLs/Self Care Home Management;Biofeedback;Cryotherapy;Electrical Stimulation;Moist Heat;Neuromuscular re-education;Balance training;Therapeutic exercise;Therapeutic activities;Functional mobility training;Patient/family education;Manual techniques;Passive range of motion;Joint Manipulations;Spinal Manipulations    PT Next Visit Plan d/c to HEP and return to MD given lack of progress    PT Home Exercise Plan Access Code: [email or web address] + bowel routine, toileting technique, diaphragmatic breathing, fiber education    Consulted and Agree with Plan of Care Patient           Patient will benefit from skilled therapeutic intervention in order to improve the following deficits and impairments:     Visit Diagnosis: Other lack of coordination  Muscle weakness (generalized)     Problem List Patient Active Problem List   Diagnosis Date Noted  . 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[facility]) 09/07/2018    . Functional dyspepsia 09/08/2017  . Slow transit constipation 09/08/2017  . S/P placement of cardiac pacemaker   . Mobitz II 02/20/2016  . Nystagmus, end-position 02/12/2016  . Dizziness and giddiness 02/12/2016  . Flapping tremor 02/12/2016  . Chronic fatigue 10/28/2015  . PCP NOTES >>>> 02/13/2015  . TBI (traumatic brain injury) ([facility]) 01/28/2015  . Essential hypertension   . Acute  post-traumatic headache, not intractable   . Skull fracture ([place])   . Vertigo due to brain injury ([place])   . SAH (subarachnoid hemorrhage) ([place]) 01/21/2015  . Annual physical exam 09/07/2014  . Orthostatic hypotension 08/30/2014  . Acromioclavicular arthrosis 03/21/2014  . Allergic rhinitis 02/27/2014  . SI (sacroiliac) joint dysfunction 02/02/2014  . Gastrocnemius strain, left 12/11/2013  . Back pain 08/22/2013  . LBBB (left bundle branch block) 05/13/2013  . Shingles outbreak 12/15/2012  . Borderline diabetes   . Paresthesia of foot 07/07/2010  . INSOMNIA, CHRONIC, MILD 01/12/2008  . HLD (hyperlipidemia) 02/04/2007  . Disorder of bone and cartilage 02/04/2007  . Anxiety 10/12/2006  . Osteoarthritis 10/12/2006    PHYSICAL THERAPY DISCHARGE SUMMARY  Visits from Start of Care: 8  Current functional level related to goals / functional outcomes: See above   Remaining deficits: See above   Education / Equipment: See above Plan: Patient agrees to discharge.  Patient goals were not met. Patient is being discharged due to lack of progress.  ?????         Baruch Merl, PT 02/06/20 4:09 PM   [facility] [address] [place], [place], [address] Phone: [phone]   Fax:  [phone]  Name: Mandy Durham MRN: [id number] Date of Birth: 06-30-1945

## 2020-02-23 ENCOUNTER — Telehealth: Payer: Self-pay | Admitting: Internal Medicine

## 2020-02-23 MED ORDER — LOSARTAN POTASSIUM 50 MG PO TABS
50.0000 mg | ORAL_TABLET | Freq: Every day | ORAL | 0 refills | Status: DC
Start: 2020-02-23 — End: 2020-06-07

## 2020-02-23 NOTE — Telephone Encounter (Signed)
Rx sent 

## 2020-02-23 NOTE — Telephone Encounter (Signed)
Medication:  losartan (COZAAR) 50 MG tablet   Has the patient contacted their pharmacy? No. (If no, request that the patient contact the pharmacy for the refill.) (If yes, when and what did the pharmacy advise?)  Preferred Pharmacy (with phone number or street name):  [facility] ([address]), [place] [facility] [place]  [address] [place]) [place] [address]  Phone:  [phone] Fax:  [phone]  DEA #:  --  DAW Reason: --     Agent: Please be advised that RX refills may take up to 3 business days. We ask that you follow-up with your pharmacy.

## 2020-03-06 ENCOUNTER — Other Ambulatory Visit: Payer: Self-pay

## 2020-03-06 ENCOUNTER — Ambulatory Visit (INDEPENDENT_AMBULATORY_CARE_PROVIDER_SITE_OTHER): Payer: PPO | Admitting: Internal Medicine

## 2020-03-06 VITALS — BP 146/73 | HR 72 | Temp 97.8°F | Ht 65.0 in | Wt 131.0 lb

## 2020-03-06 DIAGNOSIS — Z23 Encounter for immunization: Secondary | ICD-10-CM | POA: Diagnosis not present

## 2020-03-06 DIAGNOSIS — K5901 Slow transit constipation: Secondary | ICD-10-CM

## 2020-03-06 DIAGNOSIS — I10 Essential (primary) hypertension: Secondary | ICD-10-CM

## 2020-03-06 DIAGNOSIS — F419 Anxiety disorder, unspecified: Secondary | ICD-10-CM

## 2020-03-06 DIAGNOSIS — G4700 Insomnia, unspecified: Secondary | ICD-10-CM | POA: Diagnosis not present

## 2020-03-06 MED ORDER — BUSPIRONE HCL 10 MG PO TABS: 10.0000 mg | ORAL_TABLET | Freq: Two times a day (BID) | ORAL | 6 refills | Status: DC

## 2020-03-06 NOTE — Patient Instructions (Addendum)
Check the  blood pressure once a week BP GOAL is between 110/65 and  135/85. If it is consistently higher or lower, let me know  Increase buspirone to 10 mg twice a day.   GO TO THE FRONT DESK, PLEASE SCHEDULE YOUR APPOINTMENTS Come back for a physical exam, fasting in 3 months

## 2020-03-06 NOTE — Progress Notes (Signed)
Subjective:    Patient ID: Mandy Durham, female    DOB: 1945/10/29, 74 y.o.   MRN: [id number]  DOS:  03/06/2020 Type of visit - description: Follow-up  In general feels okay. She continue with fatigue and some difficulty breathing, no new issues. Denies chest pain. At the last visit she tried BuSpar, help with some with anxiety but she symptoms are not completely controlled. Ongoing constipation, refer to GI?   Wt Readings from Last 3 Encounters:  03/06/20 131 lb (59.4 kg)  12/04/19 132 lb (59.9 kg)  10/27/19 135 lb 9.6 oz (61.5 kg)     Review of Systems See above   Past Medical History:  Diagnosis Date  . Anxiety state, unspecified   . Hyperlipidemia   . Hypertension   . Insomnia   . LBBB (left bundle branch block)    LHC in 2002 showed normal coronaries.   . Osteoarthrosis, unspecified whether generalized or localized, unspecified site   . Overweight(278.02)   . S/P placement of cardiac pacemaker    a. 02/21/16: Medtronic Advisa DR MRI SureScan model [id number] (serial number [id number])   . Serrated adenoma of colon 2007  . Symptomatic menopausal or female climacteric states   . Syncope and collapse    11/12: Holter 12/12 with rare PACS, HR range 64-140, average 82, no significant arrhythmias. Echo (1/13): EF 50-55%, mild LVH, septal-lateral dyssynchrony c/w LBBB. 3-week event monitor (1/13): No significant arrhythmia.     Past Surgical History:  Procedure Laterality Date  . COLONOSCOPY  2007  . EP IMPLANTABLE DEVICE N/A 02/21/2016   Procedure: Pacemaker Implant;  Surgeon: Will Jorja Loa, MD;  [facility]: [facility];  Service: Cardiovascular;  Laterality: N/A;  . POLYPECTOMY      Allergies as of 03/06/2020   No Known Allergies     Medication List       Accurate as of March 06, 2020 11:59 PM. If you have any questions, ask your nurse or doctor.        acetaminophen 500 MG tablet Commonly known as: TYLENOL Take 500 mg by mouth every 6 (six)  hours as needed.   aspirin EC 81 MG tablet Take 81 mg by mouth daily. Swallow whole.   busPIRone 10 MG tablet Commonly known as: BUSPAR Take 1 tablet (10 mg total) by mouth 2 (two) times daily. What changed:   medication strength  how much to take Changed by: Willow Ora, MD   carbidopa-levodopa 25-100 MG tablet Commonly known as: SINEMET IR Take 1 tablet by mouth 3 (three) times daily.   ezetimibe 10 MG tablet Commonly known as: ZETIA Take 1 tablet (10 mg total) by mouth daily.   furosemide 20 MG tablet Commonly known as: LASIX Take 2 tablets (40 mg total) by mouth daily.   ibandronate 150 MG tablet Commonly known as: BONIVA ibandronate 150 mg tablet  TAKE 1 TABLET BY MOUTH ONCE EVERY MONTH IN THE MORNING FOR 30 DAYS   losartan 50 MG tablet Commonly known as: COZAAR Take 1 tablet (50 mg total) by mouth daily.   nitroGLYCERIN 0.4 MG SL tablet Commonly known as: NITROSTAT Place 1 tablet (0.4 mg total) under the tongue every 5 (five) minutes x 3 doses as needed for chest pain.   potassium chloride 10 MEQ tablet Commonly known as: Klor-Con M10 Take 1 tablet (10 mEq total) by mouth daily.   VITAMIN D PO Take 1 tablet by mouth 2 (two) times a week. Vitamin D  Objective:   Physical Exam BP (!) 146/73 (BP Location: Right Arm, Patient Position: Sitting, Cuff Size: Large)   Pulse 72   Temp 97.8 F (36.6 C) (Oral)   Ht 5\' 5"  (1.651 m)   Wt 131 lb (59.4 kg)   SpO2 96%   BMI 21.80 kg/m  General:   Well developed, NAD, BMI noted. HEENT:  Normocephalic . Face symmetric, atraumatic Lungs:  CTA B Normal respiratory effort, no intercostal retractions, no accessory muscle use. Heart: RRR,  no murmur.  Lower extremities: no pretibial edema bilaterally  Skin: Not pale. Not jaundice Neurologic:  alert & oriented X3.  Speech slow and hesitant (at baseline) , gait appropriate, seems very steady today, unassisted. Delete Psych--  Cognition and judgment appear  intact.  Cooperative with normal attention span and concentration.  Behavior appropriate. No anxious or depressed appearing.      Assessment     Assessment Prediabetes  HTN : amlodipine: edema 10/2018 Hyperlipidemia, statin intolerant Anxiety, insomnia:  Xanax  Menopausal; DEXA 2012 -1.5 @ gyn Recurrent Syncope --3:1 AV Block pacemaker 02/21/16 -- extensive cards eval before, DX with autonomic insufficiency ~ 02-2015 --on a  abd binder , pyridostigmine, rx midodrine 06-2015 per cards  Traumatic SAH (after a syncope),TBI--- admitted 01-2015 , d/c to a rehab unit temporarily -- was rx seroquel (aparently for ICU delirium) -- d/c 07-2015 --rx topamax when at rehab for post [facility]  HAs -- had issues w/b/b incontinence, better as of 07-2015 Tremors LUE, onset 9 -2017 Osteopneia: on Boniva, rx elsewhere DOE/CP: Low risk Myoview, echo done 10/20/2019  PLAN  JJH:[id number] on Lasix, losartan, potassium.  BP today 146/73, no change.  Last BMP satisfactory Hyperlipidemia: On Zetia, not fasting today, check a lipid panel on RTC Anxiety, insomnia: At the last visit, started BuSpar 5 mg twice daily, some help, we agreed to increase to 10 mg twice daily, new Rx sent. DOE: Continue with some shortness of breath, denies chest pain.  Last OV with cardiology 12/04/2019, they noted less short of breath with addition of Lasix Chronic constipation: Back in 2019 Dr. Fuller Plan refer her to [facility], she was seen by GI, evidently what they recommended has no help.  Encouraged to continue working with her [employer]. Preventive care: Had COVID shots, flu shot today. RTC CPX 3 months   This visit occurred during the SARS-CoV-2 public health emergency.  Safety protocols were in place, including screening questions prior to the visit, additional usage of staff PPE, and extensive cleaning of exam room while observing appropriate contact time as indicated for disinfecting solutions.

## 2020-03-07 ENCOUNTER — Ambulatory Visit (INDEPENDENT_AMBULATORY_CARE_PROVIDER_SITE_OTHER): Payer: PPO

## 2020-03-07 DIAGNOSIS — I441 Atrioventricular block, second degree: Secondary | ICD-10-CM | POA: Diagnosis not present

## 2020-03-07 NOTE — Assessment & Plan Note (Signed)
LSL:[id number] on Lasix, losartan, potassium.  BP today 146/73, no change.  Last BMP satisfactory Hyperlipidemia: On Zetia, not fasting today, check a lipid panel on RTC Anxiety, insomnia: At the last visit, started BuSpar 5 mg twice daily, some help, we agreed to increase to 10 mg twice daily, new Rx sent. DOE: Continue with some shortness of breath, denies chest pain.  Last OV with cardiology 12/04/2019, they noted less short of breath with addition of Lasix Chronic constipation: Back in 2019 Dr. Fuller Plan refer her to [facility], she was seen by GI, evidently what they recommended has no help.  Encouraged to continue working with her [employer]. Preventive care: Had COVID shots, flu shot today. RTC CPX 3 months

## 2020-03-10 LAB — CUP PACEART REMOTE DEVICE CHECK
Battery Remaining Longevity: 62 mo
Battery Voltage: 3 V
Brady Statistic AP VP Percent: 2.44 %
Brady Statistic AP VS Percent: 0.83 %
Brady Statistic AS VP Percent: 35.42 %
Brady Statistic AS VS Percent: 61.3 %
Brady Statistic RA Percent Paced: 3.21 %
Brady Statistic RV Percent Paced: 38.79 %
Date Time Interrogation Session: 20211223171821
Implantable Lead Implant Date: 20171208
Implantable Lead Implant Date: 20171208
Implantable Lead Location: 753859
Implantable Lead Location: 753860
Implantable Lead Model: 5076
Implantable Lead Model: 5076
Implantable Pulse Generator Implant Date: 20171208
Lead Channel Impedance Value: 361 Ohm
Lead Channel Impedance Value: 418 Ohm
Lead Channel Impedance Value: 475 Ohm
Lead Channel Impedance Value: 494 Ohm
Lead Channel Pacing Threshold Amplitude: 0.5 V
Lead Channel Pacing Threshold Amplitude: 0.625 V
Lead Channel Pacing Threshold Pulse Width: 0.4 ms
Lead Channel Pacing Threshold Pulse Width: 0.4 ms
Lead Channel Sensing Intrinsic Amplitude: 1.25 mV
Lead Channel Sensing Intrinsic Amplitude: 1.25 mV
Lead Channel Sensing Intrinsic Amplitude: 19.625 mV
Lead Channel Sensing Intrinsic Amplitude: 19.625 mV
Lead Channel Setting Pacing Amplitude: 2 V
Lead Channel Setting Pacing Amplitude: 2.5 V
Lead Channel Setting Pacing Pulse Width: 0.4 ms
Lead Channel Setting Sensing Sensitivity: 4 mV

## 2020-03-14 ENCOUNTER — Other Ambulatory Visit: Payer: Self-pay

## 2020-03-14 ENCOUNTER — Ambulatory Visit: Payer: PPO | Admitting: Cardiology

## 2020-03-14 ENCOUNTER — Encounter: Payer: Self-pay | Admitting: Cardiology

## 2020-03-14 DIAGNOSIS — I441 Atrioventricular block, second degree: Secondary | ICD-10-CM | POA: Diagnosis not present

## 2020-03-14 LAB — CUP PACEART INCLINIC DEVICE CHECK
Battery Remaining Longevity: 62 mo
Battery Voltage: 3 V
Brady Statistic AP VP Percent: 3.18 %
Brady Statistic AP VS Percent: 0.75 %
Brady Statistic AS VP Percent: 37.48 %
Brady Statistic AS VS Percent: 58.59 %
Brady Statistic RA Percent Paced: 3.84 %
Brady Statistic RV Percent Paced: 41.5 %
Date Time Interrogation Session: 20211230165417
Implantable Lead Implant Date: 20171208
Implantable Lead Implant Date: 20171208
Implantable Lead Location: 753859
Implantable Lead Location: 753860
Implantable Lead Model: 5076
Implantable Lead Model: 5076
Implantable Pulse Generator Implant Date: 20171208
Lead Channel Impedance Value: 380 Ohm
Lead Channel Impedance Value: 437 Ohm
Lead Channel Impedance Value: 513 Ohm
Lead Channel Impedance Value: 513 Ohm
Lead Channel Pacing Threshold Amplitude: 0.5 V
Lead Channel Pacing Threshold Amplitude: 0.75 V
Lead Channel Pacing Threshold Pulse Width: 0.4 ms
Lead Channel Pacing Threshold Pulse Width: 0.4 ms
Lead Channel Sensing Intrinsic Amplitude: 1.375 mV
Lead Channel Sensing Intrinsic Amplitude: 2 mV
Lead Channel Sensing Intrinsic Amplitude: 20 mV
Lead Channel Sensing Intrinsic Amplitude: 20 mV
Lead Channel Setting Pacing Amplitude: 2 V
Lead Channel Setting Pacing Amplitude: 2.5 V
Lead Channel Setting Pacing Pulse Width: 0.4 ms
Lead Channel Setting Sensing Sensitivity: 4 mV

## 2020-03-14 NOTE — Progress Notes (Signed)
Electrophysiology Office Note   Date:  03/14/2020   ID:  Mandy Durham, Mandy Durham 02/23/1946, MRN [id number]  PCP:  Wanda Plump, MD  Cardiologist:  Shirlee Latch Primary Electrophysiologist:  Sultan Pargas Jorja Loa, MD    No chief complaint on file.    History of Present Illness: Mandy Durham is a 74 y.o. female who presents today for electrophysiology evaluation.     She has a history of hypertension, hyperlipidemia.  She presented to hospital in 3-1 second-degree AV block.  She was on metoprolol which was allowed to washout but block continued.  She is now status post Medtronic dual-chamber pacemaker implanted 02/21/2016.  Today, denies symptoms of palpitations, chest pain, shortness of breath, orthopnea, PND, lower extremity edema, claudication, dizziness, presyncope, syncope, bleeding, or neurologic sequela. The patient is tolerating medications without difficulties.  Since last being seen she has done well.  She has no chest pain or shortness of breath.  She has had some episodes where she feels a jumping-like sensation that occur on a daily basis.  Device interrogation shows no correlation.  She is also been having some back pain.  She Mandy Durham discuss this further with her primary physician.  As far as cardiac wise, she is doing well and has no complaints.  Past Medical History:  Diagnosis Date  . Anxiety state, unspecified   . Hyperlipidemia   . Hypertension   . Insomnia   . LBBB (left bundle branch block)    LHC in 2002 showed normal coronaries.   . Osteoarthrosis, unspecified whether generalized or localized, unspecified site   . Overweight(278.02)   . S/P placement of cardiac pacemaker    a. 02/21/16: Medtronic Advisa DR MRI SureScan model [id number] (serial number [id number])   . Serrated adenoma of colon 2007  . Symptomatic menopausal or female climacteric states   . Syncope and collapse    11/12: Holter 12/12 with rare PACS, HR range 64-140, average 82, no significant arrhythmias. Echo  (1/13): EF 50-55%, mild LVH, septal-lateral dyssynchrony c/w LBBB. 3-week event monitor (1/13): No significant arrhythmia.    Past Surgical History:  Procedure Laterality Date  . COLONOSCOPY  2007  . EP IMPLANTABLE DEVICE N/A 02/21/2016   Procedure: Pacemaker Implant;  Surgeon: Annalucia Laino Jorja Loa, MD;  Location: [facility];  Service: Cardiovascular;  Laterality: N/A;  . POLYPECTOMY       Current Outpatient Medications  Medication Sig Dispense Refill  . acetaminophen (TYLENOL) 500 MG tablet Take 500 mg by mouth every 6 (six) hours as needed.    Marland Kitchen aspirin EC 81 MG tablet Take 81 mg by mouth daily. Swallow whole.    . busPIRone (BUSPAR) 10 MG tablet Take 1 tablet (10 mg total) by mouth 2 (two) times daily. 60 tablet 6  . carbidopa-levodopa (SINEMET IR) 25-100 MG tablet Take 1 tablet by mouth 3 (three) times daily. 270 tablet 3  . Cholecalciferol (VITAMIN D PO) Take 1 tablet by mouth 2 (two) times a week. Vitamin D    . ezetimibe (ZETIA) 10 MG tablet Take 1 tablet (10 mg total) by mouth daily. 90 tablet 3  . furosemide (LASIX) 20 MG tablet Take 2 tablets (40 mg total) by mouth daily. 90 tablet 3  . losartan (COZAAR) 50 MG tablet Take 1 tablet (50 mg total) by mouth daily. 90 tablet 0  . nitroGLYCERIN (NITROSTAT) 0.4 MG SL tablet Place 1 tablet (0.4 mg total) under the tongue every 5 (five) minutes x 3 doses as  needed for chest pain. 25 tablet 1   No current facility-administered medications for this visit.    Allergies:   Patient has no known allergies.   Social History:  The patient  reports that she has never smoked. She has never used smokeless tobacco. She reports that she does not drink alcohol and does not use drugs.   Family History:  The patient's family history includes Coronary artery disease in her mother; Dementia in her mother; Diabetes in her mother; Heart failure in her mother.   ROS:  Please see the history of present illness.   Otherwise, review of systems is  positive for none.   All other systems are reviewed and negative.   PHYSICAL EXAM: VS:  BP (!) 142/80   Pulse 85   Ht 5\' 5"  (1.651 m)   Wt 128 lb 12.8 oz (58.4 kg)   SpO2 94%   BMI 21.43 kg/m  , BMI Body mass index is 21.43 kg/m. GEN: Well nourished, well developed, in no acute distress  HEENT: normal  Neck: no JVD, carotid bruits, or masses Cardiac: RRR; no murmurs, rubs, or gallops,no edema  Respiratory:  clear to auscultation bilaterally, normal work of breathing GI: soft, nontender, nondistended, + BS MS: no deformity or atrophy  Skin: warm and dry, device site well healed Neuro:  Strength and sensation are intact Psych: euthymic mood, full affect  EKG:  EKG is ordered today. Personal review of the ekg ordered shows sinus rhythm, ventricular paced  Personal review of the device interrogation today. Results in Mandy Durham: 10/04/2019: ALT 10; Hemoglobin 13.4; Platelets 303.0; TSH 0.86 12/04/2019: BUN 13; Creatinine, Ser 0.73; Magnesium 2.3; Potassium 4.6; Sodium 140    Lipid Panel     Component Value Date/Time   CHOL 237 (H) 08/16/2017 0848   TRIG 91.0 08/16/2017 0848   HDL 104.40 08/16/2017 0848   CHOLHDL 2 08/16/2017 0848   VLDL 18.2 08/16/2017 0848   LDLCALC 114 (H) 08/16/2017 0848   LDLDIRECT 106.9 11/03/2012 0913     Wt Readings from Last 3 Encounters:  03/14/20 128 lb 12.8 oz (58.4 kg)  03/06/20 131 lb (59.4 kg)  12/04/19 132 lb (59.9 kg)      Other studies Reviewed: Additional studies/ records that were reviewed today include: TTE 07/17/15  Review of the above records today demonstrates:  - Left ventricle: The cavity size was normal. There was severe   focal basal hypertrophy of the septum. Systolic function was   normal. The estimated ejection fraction was in the range of 50%   to 55%. Anteroseptal hypokinesis. Doppler parameters are   consistent with abnormal left ventricular relaxation (grade 1   diastolic dysfunction). The E/e&' ratio  is between 8-15,   suggesting indeterminate LV filling pressure. - Mitral valve: Mildly thickened leaflets . There was trivial   regurgitation. - Left atrium: The atrium was normal in size. - Tricuspid valve: There was mild regurgitation. - Pulmonary arteries: PA peak pressure: 32 mm Hg (S). - Inferior vena cava: The vessel was normal in size. The   respirophasic diameter changes were in the normal range (>= 50%),   consistent with normal central venous pressure.   ASSESSMENT AND PLAN:  1.  3-1 AV block second-degree: Status post Medtronic dual-chamber pacemaker implanted 02/21/2016.  Device functioning appropriately.  No changes at this time.     2.  Hypertension: Usually well controlled.  No changes.  3.  Hyperlipidemia: Continue Zetia per primary cardiology  Current medicines are reviewed at length with the patient today.   The patient does not have concerns regarding her medicines.  The following changes were made today: None  Labs/ tests ordered today include:  No orders of the defined types were placed in this encounter.    Disposition:   FU with Larone Kliethermes 12 months  Signed, Zachery Niswander Jorja Loa, MD  03/14/2020 4:09 PM     [facility] [address] [place] [address] [phone] (office) [phone] (fax)

## 2020-03-19 NOTE — Addendum Note (Signed)
Addended by: Melanee Spry on: 03/19/2020 11:11 AM   Modules accepted: Orders

## 2020-03-20 NOTE — Progress Notes (Signed)
Remote pacemaker transmission.   

## 2020-04-03 ENCOUNTER — Telehealth: Payer: Self-pay | Admitting: Adult Health

## 2020-04-03 NOTE — Telephone Encounter (Signed)
Pt called needing a refill on her carbidopa-levodopa (SINEMET IR) 25-100 MG tablet sent in to the [facility] on [address]

## 2020-04-04 NOTE — Telephone Encounter (Signed)
Called pt and she did not get my message.  I placed her on my cancellation list for appt with MM/NP with worsening tremors.  I tried to call pharmacy for her sinemet and was not able to rach they are at lunch till 1400.

## 2020-04-04 NOTE — Telephone Encounter (Signed)
Patient should be scheduled for a follow-up visit if she feels that her symptoms are worse

## 2020-04-04 NOTE — Telephone Encounter (Signed)
Called pharmacy and have refill. Will get ready for pt.

## 2020-04-04 NOTE — Telephone Encounter (Signed)
I called pt and she will call pharmacy but then asked about increasing the medication.  She is taking 1 tab TID, feels her shaking is worsening.  Please advise as she will be going to pharmacy today.

## 2020-04-04 NOTE — Addendum Note (Signed)
Addended by: Brandon Melnick on: 04/04/2020 01:45 PM   Modules accepted: Orders

## 2020-04-04 NOTE — Telephone Encounter (Signed)
I called and LMVM for pt that she need to see MM.NP if worsening tremors.  Have 1400 appt today if that works if she calls back and still available. Other wise until seen keep on medication sinemet 1 tab po tid.

## 2020-04-29 ENCOUNTER — Telehealth: Payer: Self-pay | Admitting: Internal Medicine

## 2020-04-29 NOTE — Telephone Encounter (Signed)
Patient states she called before 11/162021 to cancel her appointment for 01/30/2020 but it was not cancelled. Patient would like a NO Show Fee waived.   Please Advise

## 2020-05-03 NOTE — Telephone Encounter (Signed)
I left a detailed message on the patient's voice mail advising I have emailed [employer] to remove the no show fee from her account for DOS 01/30/20 and if she has any further questions, she can reach me here at the [facility] office.

## 2020-05-21 ENCOUNTER — Other Ambulatory Visit: Payer: Self-pay | Admitting: Internal Medicine

## 2020-06-07 ENCOUNTER — Other Ambulatory Visit: Payer: Self-pay

## 2020-06-07 ENCOUNTER — Encounter: Payer: Self-pay | Admitting: Internal Medicine

## 2020-06-07 ENCOUNTER — Ambulatory Visit (INDEPENDENT_AMBULATORY_CARE_PROVIDER_SITE_OTHER): Payer: PPO | Admitting: Internal Medicine

## 2020-06-07 VITALS — BP 161/84 | HR 72 | Temp 97.5°F | Ht 65.0 in | Wt 123.0 lb

## 2020-06-07 DIAGNOSIS — E785 Hyperlipidemia, unspecified: Secondary | ICD-10-CM | POA: Diagnosis not present

## 2020-06-07 DIAGNOSIS — Z0001 Encounter for general adult medical examination with abnormal findings: Secondary | ICD-10-CM

## 2020-06-07 DIAGNOSIS — R739 Hyperglycemia, unspecified: Secondary | ICD-10-CM | POA: Diagnosis not present

## 2020-06-07 DIAGNOSIS — I10 Essential (primary) hypertension: Secondary | ICD-10-CM

## 2020-06-07 DIAGNOSIS — R0600 Dyspnea, unspecified: Secondary | ICD-10-CM

## 2020-06-07 DIAGNOSIS — R0609 Other forms of dyspnea: Secondary | ICD-10-CM

## 2020-06-07 DIAGNOSIS — Z0000 Encounter for general adult medical examination without abnormal findings: Secondary | ICD-10-CM

## 2020-06-07 DIAGNOSIS — R634 Abnormal weight loss: Secondary | ICD-10-CM

## 2020-06-07 MED ORDER — PAROXETINE HCL 20 MG PO TABS: ORAL_TABLET | ORAL | 3 refills | Status: DC

## 2020-06-07 MED ORDER — LOSARTAN POTASSIUM 100 MG PO TABS: 100.0000 mg | ORAL_TABLET | Freq: Every day | ORAL | 3 refills | Status: DC

## 2020-06-07 NOTE — Progress Notes (Signed)
Subjective:    Patient ID: Mandy Durham, female    DOB: 1945/09/12, 75 y.o.   MRN: [id number]  DOS:  06/07/2020 Type of visit - description: CPX Here for CPX but has multiple other chronic concerns.  DOE: Still an issue, she is very distressed about that.  She also has bendopnea Denies cough or wheezing.  No edema She occasionally has chest pain.  Continue with constipation. Denies nausea, vomiting or blood in the stools.  Weight loss noted again, admits to decreased appetite. Denies night sweats, difficulty swallowing.  Anxiety: Self stopped BuSpar, did not help.   BP Readings from Last 3 Encounters:  06/07/20 (!) 161/84  03/14/20 (!) 142/80  03/06/20 (!) 146/73   Wt Readings from Last 3 Encounters:  06/07/20 123 lb (55.8 kg)  03/14/20 128 lb 12.8 oz (58.4 kg)  03/06/20 131 lb (59.4 kg)     Review of Systems  Other than above, a 14 point review of systems is negative      Past Medical History:  Diagnosis Date  . Anxiety state, unspecified   . Hyperlipidemia   . Hypertension   . Insomnia   . LBBB (left bundle branch block)    LHC in 2002 showed normal coronaries.   . Osteoarthrosis, unspecified whether generalized or localized, unspecified site   . Overweight(278.02)   . S/P placement of cardiac pacemaker    a. 02/21/16: Medtronic Advisa DR MRI SureScan model [id number] (serial number [id number])   . Serrated adenoma of colon 2007  . Symptomatic menopausal or female climacteric states   . Syncope and collapse    11/12: Holter 12/12 with rare PACS, HR range 64-140, average 82, no significant arrhythmias. Echo (1/13): EF 50-55%, mild LVH, septal-lateral dyssynchrony c/w LBBB. 3-week event monitor (1/13): No significant arrhythmia.     Past Surgical History:  Procedure Laterality Date  . COLONOSCOPY  2007  . EP IMPLANTABLE DEVICE N/A 02/21/2016   Procedure: Pacemaker Implant;  Surgeon: Will Meredith Leeds, MD;  Location: [facility] [facility];  Service:  Cardiovascular;  Laterality: N/A;  . POLYPECTOMY      Allergies as of 06/07/2020   No Known Allergies     Medication List       Accurate as of June 07, 2020 11:59 PM. If you have any questions, ask your nurse or doctor.        STOP taking these medications   busPIRone 10 MG tablet Commonly known as: BUSPAR Stopped by: Kathlene November, MD     TAKE these medications   acetaminophen 500 MG tablet Commonly known as: TYLENOL Take 500 mg by mouth every 6 (six) hours as needed.   aspirin EC 81 MG tablet Take 81 mg by mouth daily. Swallow whole.   carbidopa-levodopa 25-100 MG tablet Commonly known as: SINEMET IR Take 1 tablet by mouth 3 (three) times daily.   ezetimibe 10 MG tablet Commonly known as: ZETIA Take 1 tablet (10 mg total) by mouth daily.   furosemide 20 MG tablet Commonly known as: LASIX Take 2 tablets (40 mg total) by mouth daily.   losartan 100 MG tablet Commonly known as: COZAAR Take 1 tablet (100 mg total) by mouth daily. What changed:   medication strength  how much to take Changed by: Kathlene November, MD   nitroGLYCERIN 0.4 MG SL tablet Commonly known as: NITROSTAT Place 1 tablet (0.4 mg total) under the tongue every 5 (five) minutes x 3 doses as needed for chest pain.  PARoxetine 20 MG tablet Commonly known as: Paxil Take 1/2 tablet by mouth daily for 2 weeks, then increase to 1 tablet daily Started by: Kathlene November, MD   VITAMIN D PO Take 1 tablet by mouth 2 (two) times a week. Vitamin D          Objective:   Physical Exam BP (!) 161/84 (BP Location: Left Arm, Patient Position: Sitting, Cuff Size: Large)   Pulse 72   Temp (!) 97.5 F (36.4 C) (Temporal)   Ht 5\' 5"  (1.651 m)   Wt 123 lb (55.8 kg)   SpO2 99%   BMI 20.47 kg/m  General: Well developed, NAD, BMI noted Neck: No  thyromegaly  HEENT:  Normocephalic . Face symmetric, atraumatic Lungs:  CTA B Normal respiratory effort, no intercostal retractions, no accessory muscle use. Heart:  RRR,  no murmur.  Abdomen:  Not distended, soft, non-tender. No rebound or rigidity.   Lower extremities: no pretibial edema bilaterally  Skin: Exposed areas without rash. Not pale. Not jaundice Neurologic:  alert & oriented X3.  Speech very slow. Psych: Cognition and judgment appear intact.  Cooperative with normal attention span and concentration.  Processing seemed to be slow. Behavior appropriate. Anxious but not depressed appearing.     Assessment    Assessment Prediabetes  HTN : amlodipine: edema 10/2018 Hyperlipidemia, statin intolerant Anxiety, insomnia:  Xanax  Menopausal; DEXA 2012 -1.5 @ gyn Recurrent Syncope --3:1 AV Block pacemaker 02/21/16 -- extensive cards eval before, DX with autonomic insufficiency ~ 02-2015 --on a  abd binder , pyridostigmine, rx midodrine 06-2015 per cards  Traumatic SAH (after a syncope),TBI--- admitted 01-2015  -- was rx seroquel (aparently for ICU delirium) -- d/c 07-2015 --rx topamax when at rehab for post [facility]  HAs -- had issues w/ b/b incontinence, better as of 07-2015 Tremors LUE, onset 9 -2017 Osteopneia: on Boniva, rx elsewhere DOE/CP: Low risk Myoview, echo done 10/20/2019  PLAN  Here for CPX, multiple other issues discussed. Prediabetes: Check A1c HTN: BP has been consistently elevated at the office and at home is often times in the 160s. Currently on Lasix, losartan 50 mg.  Intolerant to amlodipine. Plan: Labs today, increase losartan to 100 mg, BMP in 2 weeks. Hyperlipidemia: On Zetia, checking labs Anxiety, insomnia: Was taking BuSpar, did not help, trial with Paxil 20 mg: Half tablet initially then 1 tablet daily. DOE: Still an issue for the patient,previous w/u ok but pt states she is "at the end of my rope" regards the issue; last visit with Dr Baird Kay  03/14/2020, 3-1 AV block second-degree, has a pacemaker, felt to be stable. Plan: refer to general cardiology, consider pulmonary referral. Constipation: Chronic issue, to  see GI in [place] next month Osteopenia:  F/u @ gynecology Weight loss: Probably multifactorial, checking labs including a TSH and a CBC with peripheral smear. RTC for a BMP in 2 weeks RTC to see me in 3 months    In addition to CPX, multiple chronic problems addressed, she continues to complain of DOE, chart reviewed.  This visit occurred during the SARS-CoV-2 public health emergency.  Safety protocols were in place, including screening questions prior to the visit, additional usage of staff PPE, and extensive cleaning of exam room while observing appropriate contact time as indicated for disinfecting solutions.

## 2020-06-07 NOTE — Patient Instructions (Signed)
Your blood pressure is high: Increase losartan from 50 mg to 100 mg  Continue checking your blood pressures and get blood work in 2 weeks from today BP GOAL is between 110/65 and  135/85. If it is consistently higher or lower, let me know  You would benefit from the following vaccinations from your pharmacy: Tdap Shingrix  For anxiety: Start paroxetine 20 mg: Half tablet at night the first 2 weeks, then 1 tablet at night   Please see your gynecologist  GO TO THE LAB : Get the blood work     Ina, [place] your blood work in 2 weeks  Come back for a checkup in 3 months      Advanced care planning ("Living will", "[place] of attorney")  Advance care planning is a process that supports adults in  understanding and sharing their preferences regarding future medical care.   The patient's preferences are recorded in documents called Advance Directives.    Advanced directives are completed (and can be modified at any time) while the patient is in full mental capacity.   The documentation should be available at all times to the patient, the family and the healthcare providers.  Bring in a copy to be scanned in your chart is an excellent idea and is recommended   This legal documents direct treatment decision making and/or appoint a surrogate to make the decision if the patient is not capable to do so.    Advance directives can be documented in many types of formats,  documents have names such as:  Lliving will  Durable power of attorney for healthcare (healthcare proxy or healthcare power of attorney)  Combined directives  Physician orders for life-sustaining treatment    More information at:  [email or web address]

## 2020-06-09 ENCOUNTER — Encounter: Payer: Self-pay | Admitting: Internal Medicine

## 2020-06-09 NOTE — Assessment & Plan Note (Signed)
Tdap: Recommended Shingrix: Recommended COVID VAX x3 Had a Flu shot    Female care: No recent PAP, MMG 2020 (K PN); due to see gyn, encouraged to set a f/u  Last colonoscopy 11/16/2017 Labs:CMP, FLP, CBC, TSH, A1c,  Advance directives discussed

## 2020-06-10 LAB — CBC WITH DIFFERENTIAL/PLATELET
Absolute Monocytes: 373 cells/uL (ref 200–950)
Basophils Absolute: 32 cells/uL (ref 0–200)
Basophils Relative: 0.6 %
Eosinophils Absolute: 49 cells/uL (ref 15–500)
Eosinophils Relative: 0.9 %
HCT: 42.8 % (ref 35.0–45.0)
Hemoglobin: 14.2 g/dL (ref 11.7–15.5)
Lymphs Abs: 1409 cells/uL (ref 850–3900)
MCH: 28.8 pg (ref 27.0–33.0)
MCHC: 33.2 g/dL (ref 32.0–36.0)
MCV: 86.8 fL (ref 80.0–100.0)
MPV: 10.5 fL (ref 7.5–12.5)
Monocytes Relative: 6.9 %
Neutro Abs: 3537 cells/uL (ref 1500–7800)
Neutrophils Relative %: 65.5 %
Platelets: 276 10*3/uL (ref 140–400)
RBC: 4.93 10*6/uL (ref 3.80–5.10)
RDW: 12.4 % (ref 11.0–15.0)
Total Lymphocyte: 26.1 %
WBC: 5.4 10*3/uL (ref 3.8–10.8)

## 2020-06-10 LAB — LIPID PANEL
Cholesterol: 234 mg/dL — ABNORMAL HIGH (ref <200)
HDL: 119 mg/dL (ref >=50)
LDL Cholesterol (Calc): 100 mg/dL (calc) — ABNORMAL HIGH
Non-HDL Cholesterol (Calc): 115 mg/dL (calc) (ref <130)
Total CHOL/HDL Ratio: 2 (calc) (ref <5.0)
Triglycerides: 66 mg/dL (ref <150)

## 2020-06-10 LAB — HEMOGLOBIN A1C
Hgb A1c MFr Bld: 6.2 % of total Hgb — ABNORMAL HIGH (ref <5.7)
Mean Plasma Glucose: 131 mg/dL
eAG (mmol/L): 7.3 mmol/L

## 2020-06-10 LAB — COMPREHENSIVE METABOLIC PANEL
AG Ratio: 1.5 (calc) (ref 1.0–2.5)
ALT: 16 U/L (ref 6–29)
AST: 18 U/L (ref 10–35)
Albumin: 4 g/dL (ref 3.6–5.1)
Alkaline phosphatase (APISO): 65 U/L (ref 37–153)
BUN: 21 mg/dL (ref 7–25)
CO2: 23 mmol/L (ref 20–32)
Calcium: 9.2 mg/dL (ref 8.6–10.4)
Chloride: 104 mmol/L (ref 98–110)
Creat: 0.85 mg/dL (ref 0.60–0.93)
Globulin: 2.6 g/dL (calc) (ref 1.9–3.7)
Glucose, Bld: 102 mg/dL — ABNORMAL HIGH (ref 65–99)
Potassium: 4.1 mmol/L (ref 3.5–5.3)
Sodium: 140 mmol/L (ref 135–146)
Total Bilirubin: 0.5 mg/dL (ref 0.2–1.2)
Total Protein: 6.6 g/dL (ref 6.1–8.1)

## 2020-06-10 LAB — TSH: TSH: 1.31 mIU/L (ref 0.40–4.50)

## 2020-06-10 LAB — PATHOLOGIST SMEAR REVIEW

## 2020-06-12 ENCOUNTER — Encounter: Payer: Self-pay | Admitting: Student

## 2020-06-12 ENCOUNTER — Ambulatory Visit: Payer: PPO | Admitting: Student

## 2020-06-12 ENCOUNTER — Other Ambulatory Visit: Payer: Self-pay

## 2020-06-12 VITALS — BP 120/74 | HR 76 | Ht 66.0 in | Wt 123.6 lb

## 2020-06-12 DIAGNOSIS — I429 Cardiomyopathy, unspecified: Secondary | ICD-10-CM

## 2020-06-12 DIAGNOSIS — R0609 Other forms of dyspnea: Secondary | ICD-10-CM

## 2020-06-12 DIAGNOSIS — I5189 Other ill-defined heart diseases: Secondary | ICD-10-CM | POA: Diagnosis not present

## 2020-06-12 DIAGNOSIS — I428 Other cardiomyopathies: Secondary | ICD-10-CM | POA: Diagnosis not present

## 2020-06-12 DIAGNOSIS — Z950 Presence of cardiac pacemaker: Secondary | ICD-10-CM | POA: Diagnosis not present

## 2020-06-12 DIAGNOSIS — I441 Atrioventricular block, second degree: Secondary | ICD-10-CM | POA: Diagnosis not present

## 2020-06-12 DIAGNOSIS — R0600 Dyspnea, unspecified: Secondary | ICD-10-CM | POA: Diagnosis not present

## 2020-06-12 LAB — CUP PACEART INCLINIC DEVICE CHECK
Battery Remaining Longevity: 59 mo
Battery Voltage: 3 V
Brady Statistic AP VP Percent: 7.19 %
Brady Statistic AP VS Percent: 0.07 %
Brady Statistic AS VP Percent: 89.61 %
Brady Statistic AS VS Percent: 3.13 %
Brady Statistic RA Percent Paced: 6.96 %
Brady Statistic RV Percent Paced: 97.01 %
Date Time Interrogation Session: 20220330114557
Implantable Lead Implant Date: 20171208
Implantable Lead Implant Date: 20171208
Implantable Lead Location: 753859
Implantable Lead Location: 753860
Implantable Lead Model: 5076
Implantable Lead Model: 5076
Implantable Pulse Generator Implant Date: 20171208
Lead Channel Impedance Value: 380 Ohm
Lead Channel Impedance Value: 418 Ohm
Lead Channel Impedance Value: 513 Ohm
Lead Channel Impedance Value: 513 Ohm
Lead Channel Pacing Threshold Amplitude: 0.5 V
Lead Channel Pacing Threshold Amplitude: 0.5 V
Lead Channel Pacing Threshold Pulse Width: 0.4 ms
Lead Channel Pacing Threshold Pulse Width: 0.4 ms
Lead Channel Sensing Intrinsic Amplitude: 16.75 mV
Lead Channel Sensing Intrinsic Amplitude: 18 mV
Lead Channel Sensing Intrinsic Amplitude: 2.375 mV
Lead Channel Sensing Intrinsic Amplitude: 2.75 mV
Lead Channel Setting Pacing Amplitude: 2 V
Lead Channel Setting Pacing Amplitude: 2.5 V
Lead Channel Setting Pacing Pulse Width: 0.4 ms
Lead Channel Setting Sensing Sensitivity: 4 mV

## 2020-06-12 NOTE — Progress Notes (Signed)
Electrophysiology Office Note Date: 06/12/2020  ID:  Mandy Shamel, Durham 1945-09-18, MRN [id number]  PCP: Colon Branch, MD Primary Cardiologist: No primary care provider on file. Electrophysiologist: Will Meredith Leeds, MD   CC: Pacemaker follow-up  Mandy Durham is a 75 y.o. female seen today for Will Meredith Leeds, MD for routine electrophysiology followup.  Since last being seen in our clinic the patient reports doing about the same. She has worsening dyspnea over the past year. She has not noticed any improvement on lasix. Cardiac work up has altogether been unremarkable with normal EF and mild diastolic dysfunction, as well as myoview WNL.    Device History: Medtronic Dual Chamber PPM implanted 02/2016 for advanced AV block  Past Medical History:  Diagnosis Date  . Anxiety state, unspecified   . Hyperlipidemia   . Hypertension   . Insomnia   . LBBB (left bundle branch block)    LHC in 2002 showed normal coronaries.   . Osteoarthrosis, unspecified whether generalized or localized, unspecified site   . Overweight(278.02)   . S/P placement of cardiac pacemaker    a. 02/21/16: Medtronic Advisa DR MRI SureScan model [id number] (serial number [id number])   . Serrated adenoma of colon 2007  . Symptomatic menopausal or female climacteric states   . Syncope and collapse    11/12: Holter 12/12 with rare PACS, HR range 64-140, average 82, no significant arrhythmias. Echo (1/13): EF 50-55%, mild LVH, septal-lateral dyssynchrony c/w LBBB. 3-week event monitor (1/13): No significant arrhythmia.    Past Surgical History:  Procedure Laterality Date  . COLONOSCOPY  2007  . EP IMPLANTABLE DEVICE N/A 02/21/2016   Procedure: Pacemaker Implant;  Surgeon: Will Meredith Leeds, MD;  Location: [place] [facility];  Service: Cardiovascular;  Laterality: N/A;  . POLYPECTOMY      Current Outpatient Medications  Medication Sig Dispense Refill  . acetaminophen (TYLENOL) 500 MG tablet Take 500 mg by  mouth every 6 (six) hours as needed.    Marland Kitchen aspirin EC 81 MG tablet Take 81 mg by mouth daily. Swallow whole.    . carbidopa-levodopa (SINEMET IR) 25-100 MG tablet Take 1 tablet by mouth 3 (three) times daily. 270 tablet 3  . Cholecalciferol (VITAMIN D PO) Take 1 tablet by mouth 2 (two) times a week. Vitamin D    . ezetimibe (ZETIA) 10 MG tablet Take 1 tablet (10 mg total) by mouth daily. 90 tablet 1  . furosemide (LASIX) 20 MG tablet Take 2 tablets (40 mg total) by mouth daily. 90 tablet 3  . losartan (COZAAR) 100 MG tablet Take 1 tablet (100 mg total) by mouth daily. 30 tablet 3  . nitroGLYCERIN (NITROSTAT) 0.4 MG SL tablet Place 1 tablet (0.4 mg total) under the tongue every 5 (five) minutes x 3 doses as needed for chest pain. 25 tablet 1  . PARoxetine (PAXIL) 20 MG tablet Take 20 mg by mouth daily.     No current facility-administered medications for this visit.    Allergies:   Patient has no known allergies.   Social History: Social History   Socioeconomic History  . Marital status: Married    Spouse name: Mandy Durham  . Number of children: 2  . Years of education: Not on file  . Highest education level: Not on file  Occupational History  . Occupation: retired Environmental consultant: [employer]  Tobacco Use  . Smoking status: Never Smoker  . Smokeless tobacco: Never Used  Vaping Use  . Vaping Use: Never used  Substance and Sexual Activity  . Alcohol use: No  . Drug use: No  . Sexual activity: Not on file  Other Topics Concern  . Not on file  Social History Narrative   HSG.  Married '65.  2 daughters, '71, '77; 4 grandchildren         Social Determinants of Radio broadcast assistant Strain: Not on file  Food Insecurity: Not on file  Transportation Needs: Not on file  Physical Activity: Not on file  Stress: Not on file  Social Connections: Not on file  Intimate Partner Violence: Not on file    Family History: Family History  Problem  Relation Age of Onset  . Heart failure Mother   . Coronary artery disease Mother   . Dementia Mother   . Diabetes Mother   . Colon cancer Neg Hx   . Breast cancer Neg Hx   . Hypertension Neg Hx   . Heart disease Neg Hx   . Heart attack Neg Hx   . Stroke Neg Hx   . Rectal cancer Neg Hx   . Stomach cancer Neg Hx   . Esophageal cancer Neg Hx   . Liver disease Neg Hx      Review of Systems: All other systems reviewed and are otherwise negative except as noted above.  Physical Exam: Vitals:   06/12/20 1034  BP: 120/74  Pulse: 76  SpO2: 98%  Weight: 123 lb 9.6 oz (56.1 kg)  Height: 5\' 6"  (1.676 m)     GEN- The patient is well appearing, alert and oriented x 3 today.   HEENT: normocephalic, atraumatic; sclera clear, conjunctiva pink; hearing intact; oropharynx clear; neck supple  Lungs- Clear to ausculation bilaterally, normal work of breathing.  No wheezes, rales, rhonchi Heart- Regular rate and rhythm, no murmurs, rubs or gallops  GI- soft, non-tender, non-distended, bowel sounds present  Extremities- no clubbing or cyanosis. No edema MS- no significant deformity or atrophy Skin- warm and dry, no rash or lesion; PPM pocket well healed Psych- euthymic mood, full affect Neuro- strength and sensation are intact  PPM Interrogation- reviewed in detail today,  See PACEART report  EKG:  EKG is not ordered today.  Recent Labs: 12/04/2019: Magnesium 2.3 06/07/2020: ALT 16; BUN 21; Creat 0.85; Hemoglobin 14.2; Platelets 276; Potassium 4.1; Sodium 140; TSH 1.31   Wt Readings from Last 3 Encounters:  06/12/20 123 lb 9.6 oz (56.1 kg)  06/07/20 123 lb (55.8 kg)  03/14/20 128 lb 12.8 oz (58.4 kg)     Other studies Reviewed: Additional studies/ records that were reviewed today include: Previous EP office notes, Previous remote checks, Most recent labwork.   Assessment and Plan:  1. Advanced AV block s/p Medtronic PPM  Normal PPM function See Pace Art report No changes  today  2. HTN Well controlled current regimen.  3. HLD Continue zetia per primary cards  4. DOE Suspect multifactorial. OK with ADLs. SOB with walking short to moderate distances.  Cardiac work up has been unremarkable apart from mild diastolic dysfunction.  Will refer to pulmonology.   Current medicines are reviewed at length with the patient today.   The patient does not have concerns regarding her medicines.  The following changes were made today:  none  Labs/ tests ordered today include:  Orders Placed This Encounter  Procedures  . Ambulatory referral to Pulmonology  . CUP PACEART INCLINIC DEVICE CHECK     Disposition:  Follow up with Dr. Curt Bears as scheduled.     Jacalyn Lefevre, PA-C  06/12/2020 12:23 PM  [place] [id number] [phone] (office) [phone] (fax)

## 2020-06-12 NOTE — Patient Instructions (Signed)
Medication Instructions:  Your physician recommends that you continue on your current medications as directed. Please refer to the Current Medication list given to you today.  *If you need a refill on your cardiac medications before your next appointment, please call your pharmacy*   Lab Work: None Today If you have labs (blood work) drawn today and your tests are completely normal, you will receive your results only by: Marland Kitchen MyChart Message (if you have MyChart) OR . A paper copy in the mail If you have any lab test that is abnormal or we need to change your treatment, we will call you to review the results.   Follow-Up: At [facility], you and your health needs are our priority.  As part of our continuing mission to provide you with exceptional heart care, we have created designated Provider Care Teams.  These Care Teams include your primary Cardiologist (physician) and Advanced Practice Providers (APPs -  Physician Assistants and Nurse Practitioners) who all work together to provide you with the care you need, when you need it.  We recommend signing up for the patient portal called "MyChart".  Sign up information is provided on this After Visit Summary.  MyChart is used to connect with patients for Virtual Visits (Telemedicine).  Patients are able to view lab/test results, encounter notes, upcoming appointments, etc.  Non-urgent messages can be sent to your provider as well.   To learn more about what you can do with MyChart, go to [email or web address].    Other Instructions You have been referred to a Pulmonologist. That office will contact you to schedule an appointment.

## 2020-06-26 ENCOUNTER — Telehealth: Payer: Self-pay | Admitting: Cardiology

## 2020-06-26 NOTE — Telephone Encounter (Signed)
Patient stated that the her last office visit Tilery stated he would get her go to [facility] for appt. Patient stated that no one call for it and just want to make sure that it is still needed and for it to be setup. Please advise

## 2020-06-26 NOTE — Telephone Encounter (Signed)
I called pt to remind her that the referral information was printed on her AVS. She found it and will call them to get an appt scheduled.   The referral was mentioned in his note on 3/30 and was also ordered the same day.

## 2020-06-28 ENCOUNTER — Ambulatory Visit (INDEPENDENT_AMBULATORY_CARE_PROVIDER_SITE_OTHER): Payer: PPO

## 2020-06-28 DIAGNOSIS — I428 Other cardiomyopathies: Secondary | ICD-10-CM | POA: Diagnosis not present

## 2020-06-28 DIAGNOSIS — I429 Cardiomyopathy, unspecified: Secondary | ICD-10-CM

## 2020-07-01 DIAGNOSIS — K5904 Chronic idiopathic constipation: Secondary | ICD-10-CM | POA: Diagnosis not present

## 2020-07-02 LAB — CUP PACEART REMOTE DEVICE CHECK
Battery Remaining Longevity: 59 mo
Battery Voltage: 3 V
Brady Statistic AP VP Percent: 4.02 %
Brady Statistic AP VS Percent: 0.1 %
Brady Statistic AS VP Percent: 84.39 %
Brady Statistic AS VS Percent: 11.49 %
Brady Statistic RA Percent Paced: 4.01 %
Brady Statistic RV Percent Paced: 89.22 %
Date Time Interrogation Session: 20220415091309
Implantable Lead Implant Date: 20171208
Implantable Lead Implant Date: 20171208
Implantable Lead Location: 753859
Implantable Lead Location: 753860
Implantable Lead Model: 5076
Implantable Lead Model: 5076
Implantable Pulse Generator Implant Date: 20171208
Lead Channel Impedance Value: 361 Ohm
Lead Channel Impedance Value: 418 Ohm
Lead Channel Impedance Value: 475 Ohm
Lead Channel Impedance Value: 513 Ohm
Lead Channel Pacing Threshold Amplitude: 0.5 V
Lead Channel Pacing Threshold Amplitude: 0.5 V
Lead Channel Pacing Threshold Pulse Width: 0.4 ms
Lead Channel Pacing Threshold Pulse Width: 0.4 ms
Lead Channel Sensing Intrinsic Amplitude: 1.875 mV
Lead Channel Sensing Intrinsic Amplitude: 1.875 mV
Lead Channel Sensing Intrinsic Amplitude: 17.375 mV
Lead Channel Sensing Intrinsic Amplitude: 17.375 mV
Lead Channel Setting Pacing Amplitude: 2 V
Lead Channel Setting Pacing Amplitude: 2.5 V
Lead Channel Setting Pacing Pulse Width: 0.4 ms
Lead Channel Setting Sensing Sensitivity: 4 mV

## 2020-07-09 DIAGNOSIS — H25813 Combined forms of age-related cataract, bilateral: Secondary | ICD-10-CM | POA: Diagnosis not present

## 2020-07-15 NOTE — Progress Notes (Signed)
Remote pacemaker transmission.   

## 2020-07-19 ENCOUNTER — Encounter: Payer: Self-pay | Admitting: Pulmonary Disease

## 2020-07-19 ENCOUNTER — Ambulatory Visit: Payer: PPO | Admitting: Pulmonary Disease

## 2020-07-19 ENCOUNTER — Other Ambulatory Visit: Payer: Self-pay

## 2020-07-19 VITALS — BP 116/70 | HR 84 | Temp 97.6°F | Ht 66.0 in | Wt 120.2 lb

## 2020-07-19 DIAGNOSIS — R0600 Dyspnea, unspecified: Secondary | ICD-10-CM

## 2020-07-19 DIAGNOSIS — R0609 Other forms of dyspnea: Secondary | ICD-10-CM

## 2020-07-19 NOTE — Patient Instructions (Addendum)
Nice to meet you.  I recommend we get breathing tests to help diagnose why you are short of breath.  You can get these done at your convenience in the next couple of weeks or when you return for follow up in 4 weeks.  Come back in 4 weeks to discuss results of breathing tests and next best steps.  Follow up with Dr. Silas Flood in 4 weeks

## 2020-07-23 NOTE — Progress Notes (Signed)
@Patient  ID: Mandy Durham, female    DOB: 06-07-1945, 75 y.o.   MRN: [id number]  Chief Complaint  Patient presents with  . Consult    Referred for increased SOB for the past 18 months. Notices the SOB more so with exertion. Denies any coughing with SOB. Denies ever being on oxygen.     Referring provider: Colon Branch, MD  HPI:   75 year old whom we are seeing for evaluation of dyspnea on exertion.  PCP note reviewed.  Cardiology note reviewed.  Patient has worsening dyspnea exertion over the last year to 18 months.  Worse with inclines or stairs.  Present over longer distances on flat surfaces.  No timing during the day when things are better or worse.  No positional changes that make things better or worse.  No seasonal or environmental changes that make things better or worse.  Resting improves.  No other relieving or exacerbating factors.  Reviewed chest imaging recent chest x-ray 10/06/2019 which on my interpretation reveals clear lungs.  CT chest coronary with visible portion lungs clear on my interpretation.  Had TTE 10/2019 that showed dilated LA, diastolic dysfunction, elevated LVEDP.  PMH: Hypertension, tremor*TBI Surgical history: Colonoscopy, pacemaker implant Family history: Mother with CHF, CAD, diabetes Social history: Never smoker, lives in [place] / Pulmonary Flowsheets:   ACT:  No flowsheet data found.  MMRC: mMRC Dyspnea Scale mMRC Score  07/19/2020 0    Epworth:  No flowsheet data found.  Tests:   FENO:  No results found for: NITRICOXIDE  PFT: No flowsheet data found.  WALK:  No flowsheet data found.  Imaging: Personally reviewed and as per EMR discussion this note CUP PACEART REMOTE DEVICE CHECK  Result Date: 07/02/2020 Scheduled remote reviewed. 1 AT/AF event 9 seconds duration; appears disorganized atrial activity.  Normal device function.  R. Powers, CVRS Next remote 91 days.   Lab Results: Personally reviewed and as per  EMR, no anemia CBC    Component Value Date/Time   WBC 5.4 06/07/2020 1030   RBC 4.93 06/07/2020 1030   HGB 14.2 06/07/2020 1030   HGB 14.2 10/28/2015 1350   HCT 42.8 06/07/2020 1030   HCT 44.1 10/28/2015 1350   PLT 276 06/07/2020 1030   PLT 337 10/28/2015 1350   MCV 86.8 06/07/2020 1030   MCV 86 10/28/2015 1350   MCH 28.8 06/07/2020 1030   MCHC 33.2 06/07/2020 1030   RDW 12.4 06/07/2020 1030   RDW 14.2 10/28/2015 1350   LYMPHSABS 1,409 06/07/2020 1030   MONOABS 0.5 10/04/2019 1518   EOSABS 49 06/07/2020 1030   BASOSABS 32 06/07/2020 1030    BMET    Component Value Date/Time   NA 140 06/07/2020 1030   NA 140 12/04/2019 1540   K 4.1 06/07/2020 1030   CL 104 06/07/2020 1030   CO2 23 06/07/2020 1030   GLUCOSE 102 (H) 06/07/2020 1030   BUN 21 06/07/2020 1030   BUN 13 12/04/2019 1540   CREATININE 0.85 06/07/2020 1030   CALCIUM 9.2 06/07/2020 1030   GFRNONAA 81 12/04/2019 1540   GFRAA 93 12/04/2019 1540    BNP No results found for: BNP  ProBNP No results found for: PROBNP  Specialty Problems      Pulmonary Problems   Allergic rhinitis      No Known Allergies  Immunization History  Administered Date(s) Administered  . Fluad Quad(high Dose 65+) 12/14/2018, 03/06/2020  . Influenza Split 12/10/2011  . Influenza Whole 01/17/2007, 12/18/2008  .  Influenza, High Dose Seasonal PF 12/19/2014, 01/13/2016, 12/31/2017, 01/20/2018  . Influenza,inj,Quad PF,6+ Mos 12/05/2012, 11/14/2013  . Moderna Sars-Covid-2 Vaccination 04/20/2019, 05/16/2019, 02/09/2020  . Pneumococcal Conjugate-13 09/07/2014  . Pneumococcal Polysaccharide-23 11/02/2012  . Td 06/17/2004  . Tdap 02/12/2011    Past Medical History:  Diagnosis Date  . Anxiety state, unspecified   . Hyperlipidemia   . Hypertension   . Insomnia   . LBBB (left bundle branch block)    LHC in 2002 showed normal coronaries.   . Osteoarthrosis, unspecified whether generalized or localized, unspecified site   .  Overweight(278.02)   . S/P placement of cardiac pacemaker    a. 02/21/16: Medtronic Advisa DR MRI SureScan model [id number] (serial number [id number])   . Serrated adenoma of colon 2007  . Symptomatic menopausal or female climacteric states   . Syncope and collapse    11/12: Holter 12/12 with rare PACS, HR range 64-140, average 82, no significant arrhythmias. Echo (1/13): EF 50-55%, mild LVH, septal-lateral dyssynchrony c/w LBBB. 3-week event monitor (1/13): No significant arrhythmia.     Tobacco History: Social History   Tobacco Use  Smoking Status Never Smoker  Smokeless Tobacco Never Used   Counseling given: Not Answered   Continue to not smoke  Outpatient Encounter Medications as of 07/19/2020  Medication Sig  . acetaminophen (TYLENOL) 500 MG tablet Take 500 mg by mouth every 6 (six) hours as needed.  Marland Kitchen aspirin EC 81 MG tablet Take 81 mg by mouth daily. Swallow whole.  . carbidopa-levodopa (SINEMET IR) 25-100 MG tablet Take 1 tablet by mouth 3 (three) times daily.  . Cholecalciferol (VITAMIN D PO) Take 1 tablet by mouth 2 (two) times a week. Vitamin D  . ezetimibe (ZETIA) 10 MG tablet Take 1 tablet (10 mg total) by mouth daily.  . furosemide (LASIX) 20 MG tablet Take 2 tablets (40 mg total) by mouth daily.  Marland Kitchen losartan (COZAAR) 100 MG tablet Take 1 tablet (100 mg total) by mouth daily.  . nitroGLYCERIN (NITROSTAT) 0.4 MG SL tablet Place 1 tablet (0.4 mg total) under the tongue every 5 (five) minutes x 3 doses as needed for chest pain.  Marland Kitchen PARoxetine (PAXIL) 20 MG tablet Take 20 mg by mouth daily.   No facility-administered encounter medications on file as of 07/19/2020.     Review of Systems  Review of Systems  No chest pain with exertion, no orthopnea or PND. Comprehensive ROS  Physical Exam  BP 116/70   Pulse 84   Temp 97.6 F (36.4 C) (Temporal)   Ht 5\' 6"  (1.676 m)   Wt 120 lb 3.2 oz (54.5 kg)   SpO2 98% Comment: on RA  BMI 19.40 kg/m   Wt Readings from Last 5  Encounters:  07/19/20 120 lb 3.2 oz (54.5 kg)  06/12/20 123 lb 9.6 oz (56.1 kg)  06/07/20 123 lb (55.8 kg)  03/14/20 128 lb 12.8 oz (58.4 kg)  03/06/20 131 lb (59.4 kg)    BMI Readings from Last 5 Encounters:  07/19/20 19.40 kg/m  06/12/20 19.95 kg/m  06/07/20 20.47 kg/m  03/14/20 21.43 kg/m  03/06/20 21.80 kg/m     Physical Exam General: Sitting up in chair, in no distress Eyes: EOMI, icterus Neck: Supple, no JVP appreciated Cardiovascular: Irregular, warm Pulmonary: Clear to auscultation bilaterally, no wheezes Abdomen: Nondistended, bowel sounds present MSK: No synovitis, joint effusion Neuro: Normal gait, no weakness, near constant right lower extremity tremor and occasional upper extremity, hand tremor particular with walking  Psych: Normal mood, full affect   Assessment & Plan:   Dyspnea on exertion: Suspect is multifactorial related somewhat to deconditioning as she has been less active over the course of the pandemic.  Do suspect an element of cardiac component given her diastolic dysfunction left atrial dilation as well as confirmed elevated LVEDP on TTE 10/2019.  Suspect worsening diastology with exercise contributing to symptoms.  Prior chest imaging clear.  She is never smoker.  Denies atopic symptoms.  Will obtain PFTs in the coming weeks to evaluate possible pulmonary component.   Return in about 4 weeks (around 08/16/2020).   Lanier Clam, MD 07/23/2020

## 2020-07-29 DIAGNOSIS — Z01818 Encounter for other preprocedural examination: Secondary | ICD-10-CM | POA: Diagnosis not present

## 2020-07-29 DIAGNOSIS — H25812 Combined forms of age-related cataract, left eye: Secondary | ICD-10-CM | POA: Diagnosis not present

## 2020-07-29 DIAGNOSIS — H25811 Combined forms of age-related cataract, right eye: Secondary | ICD-10-CM | POA: Diagnosis not present

## 2020-08-02 DIAGNOSIS — H2512 Age-related nuclear cataract, left eye: Secondary | ICD-10-CM | POA: Diagnosis not present

## 2020-08-02 DIAGNOSIS — H25812 Combined forms of age-related cataract, left eye: Secondary | ICD-10-CM | POA: Diagnosis not present

## 2020-08-21 ENCOUNTER — Telehealth: Payer: Self-pay | Admitting: *Deleted

## 2020-08-21 NOTE — Telephone Encounter (Signed)
I called patient left message that Mandy Durham had 2 openings this afternoon she was on my cancellation list.  If she wants to call back to see if they are still open I gave her [phone] to call back.

## 2020-08-28 ENCOUNTER — Ambulatory Visit (INDEPENDENT_AMBULATORY_CARE_PROVIDER_SITE_OTHER): Payer: PPO | Admitting: Internal Medicine

## 2020-08-28 ENCOUNTER — Encounter: Payer: Self-pay | Admitting: Internal Medicine

## 2020-08-28 ENCOUNTER — Other Ambulatory Visit: Payer: Self-pay

## 2020-08-28 VITALS — BP 124/78 | HR 86 | Temp 98.0°F | Resp 16 | Ht 66.0 in | Wt 120.0 lb

## 2020-08-28 DIAGNOSIS — F419 Anxiety disorder, unspecified: Secondary | ICD-10-CM

## 2020-08-28 DIAGNOSIS — R627 Adult failure to thrive: Secondary | ICD-10-CM | POA: Diagnosis not present

## 2020-08-28 DIAGNOSIS — Z1231 Encounter for screening mammogram for malignant neoplasm of breast: Secondary | ICD-10-CM | POA: Diagnosis not present

## 2020-08-28 DIAGNOSIS — L603 Nail dystrophy: Secondary | ICD-10-CM | POA: Diagnosis not present

## 2020-08-28 NOTE — Patient Instructions (Addendum)
We have placed an order for your mammogram to be done at [facility]. Please expect a call in the next several days to schedule an appointment.    You have an appointment to see the neurologist on 10-28-20  We are referring you to the podiatrist to help you with your nail care. You can call them at [place], PLEASE SCHEDULE YOUR APPOINTMENTS Come back for a physical exam in 3 months.

## 2020-08-28 NOTE — Progress Notes (Signed)
Subjective:    Patient ID: Mandy Durham, female    DOB: 06/14/1945, 75 y.o.   MRN: [id number]  DOS:  08/28/2020 Type of visit - description: ROV Since the last office visit, saw cardiology and pulmonary: OV notes reviewed.  States that overall she feels she is losing ground, memory is decreasing, her speech is more broken. She has a lot of stress, could not tolerate Paxil, states that 2 weeks after he started she noted some weakness in the right side of the face so she stopped it. Having problems with her nails.  Referral?.  Review of Systems See above   Past Medical History:  Diagnosis Date   Anxiety state, unspecified    Hyperlipidemia    Hypertension    Insomnia    LBBB (left bundle branch block)    LHC in 2002 showed normal coronaries.    Osteoarthrosis, unspecified whether generalized or localized, unspecified site    Overweight(278.02)    S/P placement of cardiac pacemaker    a. 02/21/16: Medtronic Advisa DR MRI SureScan model [id number] (serial number [id number])    Serrated adenoma of colon 2007   Symptomatic menopausal or female climacteric states    Syncope and collapse    11/12: Holter 12/12 with rare PACS, HR range 64-140, average 82, no significant arrhythmias. Echo (1/13): EF 50-55%, mild LVH, septal-lateral dyssynchrony c/w LBBB. 3-week event monitor (1/13): No significant arrhythmia.     Past Surgical History:  Procedure Laterality Date   COLONOSCOPY  2007   EP IMPLANTABLE DEVICE N/A 02/21/2016   Procedure: Pacemaker Implant;  Surgeon: Will Meredith Leeds, MD;  Location: [place] [facility];  Service: Cardiovascular;  Laterality: N/A;   POLYPECTOMY      Allergies as of 08/28/2020   No Known Allergies      Medication List        Accurate as of Saliyah 15, 2022 11:59 PM. If you have any questions, ask your nurse or doctor.          STOP taking these medications    PARoxetine 20 MG tablet Commonly known as: PAXIL Stopped by: Kathlene November, MD        TAKE these medications    acetaminophen 500 MG tablet Commonly known as: TYLENOL Take 500 mg by mouth every 6 (six) hours as needed.   aspirin EC 81 MG tablet Take 81 mg by mouth daily. Swallow whole.   carbidopa-levodopa 25-100 MG tablet Commonly known as: SINEMET IR Take 1 tablet by mouth 3 (three) times daily.   ezetimibe 10 MG tablet Commonly known as: ZETIA Take 1 tablet (10 mg total) by mouth daily.   furosemide 20 MG tablet Commonly known as: LASIX Take 2 tablets (40 mg total) by mouth daily.   losartan 100 MG tablet Commonly known as: COZAAR Take 1 tablet (100 mg total) by mouth daily.   nitroGLYCERIN 0.4 MG SL tablet Commonly known as: NITROSTAT Place 1 tablet (0.4 mg total) under the tongue every 5 (five) minutes x 3 doses as needed for chest pain.   VITAMIN D PO Take 1 tablet by mouth 2 (two) times a week. Vitamin D           Objective:   Physical Exam BP 124/78 (BP Location: Left Arm, Patient Position: Sitting, Cuff Size: Small)   Pulse 86   Temp 98 F (36.7 C) (Oral)   Resp 16   Ht 5\' 6"  (1.676 m)   Wt 120 lb (54.4 kg)  SpO2 96%   BMI 19.37 kg/m  General:   Well developed, NAD, BMI noted. HEENT:  Normocephalic . Face symmetric, atraumatic Lungs:  CTA B Normal respiratory effort, no intercostal retractions, no accessory muscle use. Heart: RRR,  no murmur.  Lower extremities: no pretibial edema bilaterally  Skin: Nails dystrophic and extremely long particularly at the great left toe. Neurologic:  alert & oriented X3.  Speech: Aphasic, broken, not far from baseline.  Gait appropriate for age and unassisted Psych--  Cognition and judgment appear intact.  Cooperative with normal attention span and concentration.  Behavior appropriate. Apprehensive appearing     Assessment     Assessment Prediabetes  HTN : amlodipine: edema 10/2018 Hyperlipidemia, statin intolerant Anxiety, insomnia   Menopausal; DEXA 2012 -1.5 @ gyn Recurrent  Syncope --3:1 AV Block pacemaker 02/21/16 -- extensive cards eval before, DX with autonomic insufficiency ~ 02-2015 --on a  abd binder , pyridostigmine, rx midodrine 06-2015 per cards  Traumatic SAH (after a syncope),TBI--- admitted 01-2015  -- was rx seroquel (aparently for ICU delirium) -- d/c 07-2015 --rx topamax when at rehab for post [facility]  HAs -- had issues w/ b/b incontinence, better as of 07-2015 Tremors LUE, onset 9 -2017 Osteopneia: on Boniva, rx elsewhere DOE/CP: Low risk Myoview, echo done 10/20/2019, saw cards 05/2020 and pulmonary 07/2020: multifactorial, PFTx ordered   PLAN  Failure to thrive: Overall patient is telling me that her memory is decreasing, her speech is getting worse, she is having a hard time keeping up with her house and taking care of her husband. Household is pt and  husband, Kellyanne still drives, still pay the bills and goes shopping Advised patient I cannot point to a single issue that is causing her gradual decline, is probably multifactorial including normal aging. Advised to discuss this openly with her family, I anticipate she will need help. Anxiety: Was intolerant to Paxil. Nail care: Unable to take care of her nails, referred to podiatry.  You have an appointment to see the neurologist DOE: Since the last visit, Saw cardiology 06/12/2020, DOE felt to be multifactorial.  Refer to pulmonary, they agree with multifactorial etiology, including deconditioning.  Rx PFTs RTC 3 months CPX  Time spent 35 minutes with the patient, we talk about failure to thrive, she described in  great detail her problems, I advised her the best I could.  This visit occurred during the SARS-CoV-2 public health emergency.  Safety protocols were in place, including screening questions prior to the visit, additional usage of staff PPE, and extensive cleaning of exam room while observing appropriate contact time as indicated for disinfecting solutions.

## 2020-08-29 NOTE — Assessment & Plan Note (Signed)
Failure to thrive: Overall patient is telling me that her memory is decreasing, her speech is getting worse, she is having a hard time keeping up with her house and taking care of her husband. Household is pt and  husband, Trista still drives, still pay the bills and goes shopping Advised patient I cannot point to a single issue that is causing her gradual decline, is probably multifactorial including normal aging. Advised to discuss this openly with her family, I anticipate she will need help. Anxiety: Was intolerant to Paxil. Nail care: Unable to take care of her nails, referred to podiatry.  You have an appointment to see the neurologist DOE: Since the last visit, Saw cardiology 06/12/2020, DOE felt to be multifactorial.  Refer to pulmonary, they agree with multifactorial etiology, including deconditioning.  Rx PFTs RTC 3 months CPX

## 2020-09-05 DIAGNOSIS — H25811 Combined forms of age-related cataract, right eye: Secondary | ICD-10-CM | POA: Diagnosis not present

## 2020-09-05 DIAGNOSIS — H2511 Age-related nuclear cataract, right eye: Secondary | ICD-10-CM | POA: Diagnosis not present

## 2020-09-09 ENCOUNTER — Ambulatory Visit: Payer: PPO | Admitting: Internal Medicine

## 2020-10-09 DIAGNOSIS — M5459 Other low back pain: Secondary | ICD-10-CM | POA: Diagnosis not present

## 2020-10-17 ENCOUNTER — Other Ambulatory Visit: Payer: Self-pay

## 2020-10-17 ENCOUNTER — Ambulatory Visit (INDEPENDENT_AMBULATORY_CARE_PROVIDER_SITE_OTHER): Payer: PPO | Admitting: Podiatry

## 2020-10-17 DIAGNOSIS — M79675 Pain in left toe(s): Secondary | ICD-10-CM

## 2020-10-17 DIAGNOSIS — B351 Tinea unguium: Secondary | ICD-10-CM | POA: Diagnosis not present

## 2020-10-17 DIAGNOSIS — M79674 Pain in right toe(s): Secondary | ICD-10-CM | POA: Diagnosis not present

## 2020-10-21 NOTE — Progress Notes (Signed)
  Subjective:  Patient ID: Mandy Durham, female    DOB: 30-Aug-1945,  MRN: [id number]  Chief Complaint  Patient presents with   Nail Problem     (np) Dystrophic nail; left foot    75 y.o. female presents with the above complaint. History confirmed with patient.  Nails are severely thickened and elongated and she has not been able to cut them at all.  They are becoming very painful in her shoes.  The left hallux nail is very difficult  Objective:  Physical Exam: warm, good capillary refill, no trophic changes or ulcerative lesions, normal DP and PT pulses, and normal sensory exam. Left Foot: dystrophic yellowed discolored nail plates with subungual debris hallux nail severely onychogryphotic Right Foot: dystrophic yellowed discolored nail plates with subungual debris  Assessment:   1. Pain due to onychomycosis of toenails of both feet      Plan:  Patient was evaluated and treated and all questions answered.  Discussed the etiology and treatment options for the condition in detail with the patient. Educated patient on the topical and oral treatment options for mycotic nails. Recommended debridement of the nails today. Sharp and mechanical debridement performed of all painful and mycotic nails today. Nails debrided in length and thickness using a nail nipper to level of comfort. Discussed treatment options including appropriate shoe gear. Follow up as needed for painful nails.    Return in about 3 months (around 01/17/2021) for RFC.

## 2020-10-24 DIAGNOSIS — M5459 Other low back pain: Secondary | ICD-10-CM | POA: Diagnosis not present

## 2020-10-28 ENCOUNTER — Other Ambulatory Visit: Payer: Self-pay

## 2020-10-28 ENCOUNTER — Ambulatory Visit: Payer: PPO | Admitting: Adult Health

## 2020-10-28 VITALS — BP 179/83 | HR 76 | Ht 66.0 in | Wt 117.4 lb

## 2020-10-28 DIAGNOSIS — R251 Tremor, unspecified: Secondary | ICD-10-CM | POA: Diagnosis not present

## 2020-10-28 DIAGNOSIS — F8081 Childhood onset fluency disorder: Secondary | ICD-10-CM | POA: Diagnosis not present

## 2020-10-28 DIAGNOSIS — R4701 Aphasia: Secondary | ICD-10-CM | POA: Diagnosis not present

## 2020-10-28 NOTE — Progress Notes (Signed)
PATIENT: Mandy Durham DOB: 11-24-1945  REASON FOR VISIT: follow up HISTORY FROM: patient Primary neurologist: Dr. Brett Fairy  HISTORY OF PRESENT ILLNESS: Today 10/28/20:  Mr. Mandy Durham is a 75 year old female with a history of tremor.  She returns today for follow-up.  She feels that her tremor is worse.  She states that it occurs mainly with activity.  Primarily in the hands.  She is able to complete all ADLs independently.  She remains on Sinemet 25-100 mg 3 times a day.  Denies any significant changes with her walking.  Does report that she has some back pain and that changes her gait slightly.  She states that her speech is worse.  She has noticed more stuttering.  The patient also has several other complaints during today's visit.  States that she has been depressed.  Her PCP started her on 2 medications but she is no longer on those.  Also notices a decline in her memory, lack of energy and feeling tired throughout the day.  She returns today for an evaluation.  10/26/19: [name] is a 75 year old female with a history of tremor.  She returns today for follow-up.  She feels that her tremor has remained relatively stable.  She states that she has been under more stress recently and does feel exhausted.  She continues on Sinemet 3 times a day.  Denies any significant changes with her gait.  Denies any falls.  She continues to have trouble with expressive aphasia.  She returns today for follow-up.  HISTORY (Copied from Dr.Dohmeier's note) Mandy Durham had originally asked for this appointment due to incontinence following a SAH. She had fainted, fell and aquiered the TBI with SAH. She states that the incontinence  is improved. She still has some urine leakage and soft stools. She wonders if this is still a neurologic impairment from the [facility]. She  has no pain with bowel movements, currently no headaches or nausea. She remains on Mestinon to protect her from fainting spells.  Patient was last seen  in April 2019 by Venancio Poisson, NP.  She had tried carbidopa - Ldopa, and it had initially helped her tremors, which I believed to be essential tremor.  She now reports that the tremor is no longer controlled. She is on bid Sinemet 25/ 100 mg.      REVIEW OF SYSTEMS: Out of a complete 14 system review of symptoms, the patient complains only of the following symptoms, and all other reviewed systems are negative.  See HPI  ALLERGIES: No Known Allergies  HOME MEDICATIONS: Outpatient Medications Prior to Visit  Medication Sig Dispense Refill   acetaminophen (TYLENOL) 500 MG tablet Take 500 mg by mouth every 6 (six) hours as needed.     aspirin EC 81 MG tablet Take 81 mg by mouth daily. Swallow whole.     carbidopa-levodopa (SINEMET IR) 25-100 MG tablet Take 1 tablet by mouth 3 (three) times daily. 270 tablet 3   Cholecalciferol (VITAMIN D PO) Take 1 tablet by mouth 2 (two) times a week. Vitamin D     ezetimibe (ZETIA) 10 MG tablet Take 1 tablet (10 mg total) by mouth daily. 90 tablet 1   furosemide (LASIX) 20 MG tablet Take 2 tablets (40 mg total) by mouth daily. 90 tablet 3   losartan (COZAAR) 100 MG tablet Take 1 tablet (100 mg total) by mouth daily. 30 tablet 3   nitroGLYCERIN (NITROSTAT) 0.4 MG SL tablet Place 1 tablet (0.4 mg total) under the  tongue every 5 (five) minutes x 3 doses as needed for chest pain. 25 tablet 1   potassium chloride (KLOR-CON) 10 MEQ tablet potassium chloride ER 10 mEq tablet,extended release  TAKE 1 TABLET BY MOUTH ONCE DAILY     escitalopram (LEXAPRO) 5 MG tablet escitalopram 5 mg tablet  TAKE 1 TABLET BY MOUTH AT BEDTIME (Patient not taking: Reported on 10/28/2020)     LINZESS 145 MCG CAPS capsule Take 145 mcg by mouth every morning. (Patient not taking: Reported on 10/28/2020)     busPIRone (BUSPAR) 5 MG tablet buspirone 5 mg tablet  TAKE 1 TABLET BY MOUTH TWICE DAILY FOR 1 WEEK THEN 1 TABLET THREE TIMES DAILY (Patient not taking: Reported on 10/28/2020)      losartan (COZAAR) 50 MG tablet losartan 50 mg tablet  TAKE 1 TABLET BY MOUTH ONCE DAILY     PARoxetine (PAXIL) 20 MG tablet paroxetine 20 mg tablet  TAKE 1/2 (ONE-HALF) TABLET BY MOUTH ONCE DAILY FOR 2 WEEKS AND THEN 1 ONCE DAILY     No facility-administered medications prior to visit.    PAST MEDICAL HISTORY: Past Medical History:  Diagnosis Date   Anxiety state, unspecified    Hyperlipidemia    Hypertension    Insomnia    LBBB (left bundle branch block)    LHC in 2002 showed normal coronaries.    Osteoarthrosis, unspecified whether generalized or localized, unspecified site    Overweight(278.02)    S/P placement of cardiac pacemaker    a. 02/21/16: Medtronic Advisa DR MRI SureScan model [id number] (serial number [id number])    Serrated adenoma of colon 2007   Symptomatic menopausal or female climacteric states    Syncope and collapse    11/12: Holter 12/12 with rare PACS, HR range 64-140, average 82, no significant arrhythmias. Echo (1/13): EF 50-55%, mild LVH, septal-lateral dyssynchrony c/w LBBB. 3-week event monitor (1/13): No significant arrhythmia.     PAST SURGICAL HISTORY: Past Surgical History:  Procedure Laterality Date   COLONOSCOPY  2007   EP IMPLANTABLE DEVICE N/A 02/21/2016   Procedure: Pacemaker Implant;  Surgeon: Will Meredith Leeds, MD;  Location: [place] [facility];  Service: Cardiovascular;  Laterality: N/A;   POLYPECTOMY      FAMILY HISTORY: Family History  Problem Relation Age of Onset   Heart failure Mother    Coronary artery disease Mother    Dementia Mother    Diabetes Mother    Colon cancer Neg Hx    Breast cancer Neg Hx    Hypertension Neg Hx    Heart disease Neg Hx    Heart attack Neg Hx    Stroke Neg Hx    Rectal cancer Neg Hx    Stomach cancer Neg Hx    Esophageal cancer Neg Hx    Liver disease Neg Hx     SOCIAL HISTORY: Social History   Socioeconomic History   Marital status: Married    Spouse name: Eddie   Number of children:  2   Years of education: Not on file   Highest education level: Not on file  Occupational History   Occupation: retired Environmental consultant: [employer]  Tobacco Use   Smoking status: Never   Smokeless tobacco: Never  Vaping Use   Vaping Use: Never used  Substance and Sexual Activity   Alcohol use: No   Drug use: No   Sexual activity: Not on file  Other Topics Concern   Not on file  Social History Narrative   HSG.  Married '65.  2 daughters, '71, '77; 4 grandchildren         Social Determinants of Radio broadcast assistant Strain: Not on file  Food Insecurity: Not on file  Transportation Needs: Not on file  Physical Activity: Not on file  Stress: Not on file  Social Connections: Not on file  Intimate Partner Violence: Not on file      PHYSICAL EXAM  Vitals:   10/28/20 1436  BP: (!) 179/83  Pulse: 76  Weight: 117 lb 6.4 oz (53.3 kg)  Height: '5\' 6"'$  (1.676 m)   Body mass index is 18.95 kg/m.  Generalized: Well developed, in no acute distress   Neurological examination  Mentation: Alert oriented to time, place, history taking. Expressive aphasia noted with stuttering  Cranial nerve II-XII: Pupils were equal round reactive to light. Extraocular movements were full, visual field were full on confrontational test. Facial sensation and strength were normal. Uvula tongue midline. Head turning and shoulder shrug  were normal and symmetric. Motor: The motor testing reveals 5 over 5 strength of all 4 extremities. Good symmetric motor tone is noted throughout.   Sensory: Sensory testing is intact to soft touch on all 4 extremities. No evidence of extinction is noted.  Coordination: Cerebellar testing reveals good finger-nose-finger and heel-to-shin bilaterally. Mild tremor in the upper extremities. Gait and station: Gait is normal.  Good stride.  Good turns and arm swing.  Tremor noted in the left hand..  Reflexes: Deep tendon reflexes are  symmetric and normal bilaterally.   DIAGNOSTIC DATA (LABS, IMAGING, TESTING) - I reviewed patient records, labs, notes, testing and imaging myself where available.  Lab Results  Component Value Date   WBC 5.4 06/07/2020   HGB 14.2 06/07/2020   HCT 42.8 06/07/2020   MCV 86.8 06/07/2020   PLT 276 06/07/2020      Component Value Date/Time   NA 140 06/07/2020 1030   NA 140 12/04/2019 1540   K 4.1 06/07/2020 1030   CL 104 06/07/2020 1030   CO2 23 06/07/2020 1030   GLUCOSE 102 (H) 06/07/2020 1030   BUN 21 06/07/2020 1030   BUN 13 12/04/2019 1540   CREATININE 0.85 06/07/2020 1030   CALCIUM 9.2 06/07/2020 1030   PROT 6.6 06/07/2020 1030   PROT 6.7 10/28/2015 1350   ALBUMIN 4.2 10/04/2019 1518   ALBUMIN 3.9 10/28/2015 1350   AST 18 06/07/2020 1030   ALT 16 06/07/2020 1030   ALKPHOS 68 10/04/2019 1518   BILITOT 0.5 06/07/2020 1030   BILITOT 0.3 10/28/2015 1350   GFRNONAA 81 12/04/2019 1540   GFRAA 93 12/04/2019 1540   Lab Results  Component Value Date   CHOL 234 (H) 06/07/2020   HDL 119 06/07/2020   LDLCALC 100 (H) 06/07/2020   LDLDIRECT 106.9 11/03/2012   TRIG 66 06/07/2020   CHOLHDL 2.0 06/07/2020   Lab Results  Component Value Date   HGBA1C 6.2 (H) 06/07/2020   Lab Results  Component Value Date   VITAMINB12 390 10/04/2019   Lab Results  Component Value Date   TSH 1.31 06/07/2020      ASSESSMENT AND PLAN 75 y.o. year old female  has a past medical history of Anxiety state, unspecified, Hyperlipidemia, Hypertension, Insomnia, LBBB (left bundle branch block), Osteoarthrosis, unspecified whether generalized or localized, unspecified site, Overweight(278.02), S/P placement of cardiac pacemaker, Serrated adenoma of colon (2007), Symptomatic menopausal or female climacteric states, and Syncope and collapse. here with :  Tremor  Continue Sinemet 25-100 mg 3 times a day Patient reports multiple other symptoms today.  Encouraged her to follow-up with her PCP  regarding her depression -as untreated depression may be contributing to decline in memory and lack of energy throughout the day.  Expressive aphasia with stuttering  Referral sent for speech therapy Follow-up in 1 year or sooner if needed   I spent 30 minutes of face-to-face and non-face-to-face time with patient.  This included previsit chart review, lab review, study review, order entry, electronic health record documentation, patient education.  Ward Givens, MSN, NP-C 10/28/2020, 2:48 PM [facility] [address] [place], [place] [address] [phone]

## 2020-10-28 NOTE — Patient Instructions (Signed)
Your Plan:  Continue Sinemet for tremor Advised patient to follow-up with Dr. Larose Kells for depression If your symptoms worsen or you develop new symptoms please let us know.   Thank you for coming to see [place] at [facility]. I hope we have been able to provide you high quality care today.  You may receive a patient satisfaction survey over the next few weeks. We would appreciate your feedback and comments so that we may continue to improve ourselves and the health of our patients.

## 2020-11-06 ENCOUNTER — Ambulatory Visit: Payer: PPO | Attending: Adult Health | Admitting: Speech Pathology

## 2020-11-06 ENCOUNTER — Other Ambulatory Visit: Payer: Self-pay

## 2020-11-06 DIAGNOSIS — F8081 Childhood onset fluency disorder: Secondary | ICD-10-CM | POA: Diagnosis not present

## 2020-11-06 DIAGNOSIS — R41841 Cognitive communication deficit: Secondary | ICD-10-CM | POA: Diagnosis not present

## 2020-11-07 NOTE — Therapy (Addendum)
[place] [place], [place], [address] Phone: [phone]   Fax:  [phone]  Speech Language Pathology Evaluation  Patient Details  Name: Murl STUTI GRZESIAK MRN: [id number] Date of Birth: 11-17-1945 No data recorded  Encounter Date: 11/06/2020   End of Session - 11/06/20 1638     Visit Number 1    Number of Visits 8    Date for SLP Re-Evaluation 01/06/21    SLP Start Time 1400    SLP Stop Time  1500    SLP Time Calculation (min) 60 min    Activity Tolerance Patient tolerated treatment well             Past Medical History:  Diagnosis Date   Anxiety state, unspecified    Hyperlipidemia    Hypertension    Insomnia    LBBB (left bundle branch block)    LHC in 2002 showed normal coronaries.    Osteoarthrosis, unspecified whether generalized or localized, unspecified site    Overweight(278.02)    S/P placement of cardiac pacemaker    a. 02/21/16: Medtronic Advisa DR MRI SureScan model [id number] (serial number [id number])    Serrated adenoma of colon 2007   Symptomatic menopausal or female climacteric states    Syncope and collapse    11/12: Holter 12/12 with rare PACS, HR range 64-140, average 82, no significant arrhythmias. Echo (1/13): EF 50-55%, mild LVH, septal-lateral dyssynchrony c/w LBBB. 3-week event monitor (1/13): No significant arrhythmia.     Past Surgical History:  Procedure Laterality Date   COLONOSCOPY  2007   EP IMPLANTABLE DEVICE N/A 02/21/2016   Procedure: Pacemaker Implant;  Surgeon: Will Meredith Leeds, MD;  Location: [place] [facility];  Service: Cardiovascular;  Laterality: N/A;   POLYPECTOMY      There were no vitals filed for this visit.   Subjective Assessment - 11/07/20 0758     Subjective "I just feel like I am being drug down."    Currently in Pain? No/denies                SLP Evaluation [facility] - 11/06/20 1409       Balance Screen   Has the patient fallen in the  past 6 months No      Prior Functional Status   Type of Home House     Lives With Spouse;Other (Comment)   Eddie   Available Support Family    Education Highschool    Vocation Retired                             SLP Education - 11/06/20 1625     Education Details Provided edu on impact anxiety/depression has on cognition; fluency; word finding    Person(s) Educated Patient    Methods Demonstration;Explanation    Comprehension Verbalized understanding;Need further instruction              SLP Short Term Goals - 11/07/20 0840       SLP SHORT TERM GOAL #1   Title Pt will recall and demonstrate use of 2-3 memory strategies to increase recall of important information minA.    Time 4    Period Weeks    Status New      SLP SHORT TERM GOAL #2   Title Pt will recall 2-3 word finding strategies for use in instances of anomia with minA.    Time 4  Period Weeks    Status New      SLP SHORT TERM GOAL #3   Title Pt will integrate fluency enhancing strategies to facilitate more fluent speech (i.e. relaxed breath, stretched speech, slow rate) with minA.    Time 4    Period Weeks    Status New    Target Date 12/05/20              SLP Long Term Goals - 11/07/20 0835       SLP LONG TERM GOAL #1   Title Pt will report successful use of memory strategies to increase ability to recall information at home.    Time 8    Period Weeks    Status New    Target Date 01/02/21      SLP LONG TERM GOAL #2   Title Pt will report successful use of word finding strategies to decrease anomia in conversation.    Time 8    Period Weeks    Status New    Target Date 01/02/21      SLP LONG TERM GOAL #3   Title Pt will demonstrate increase in fluent speech by utilizing fluency enhancing strateiges to increase confidence in conversation with <2 prompts from therapist.    Time 8    Period Weeks    Status New    Target Date 01/02/21              Plan -  11/06/20 1639     Clinical Impression Statement Pt is a 75 yo female who presents today with dx of parkinsonism. Relevant hx includes TBI in 2016, she does not recall having speech therapy; SLP notes that pt was receiving SLP services in 2017. Pt was referred to SLP evaluation for dx of "stuttering" and "expressive aphasia". Pt reported extreme sadness and anxiety that has co-occurred with her increase in disfluencies. 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 Three months ago, pt reported that she began stuttering again. She reported the stuttering re-emerged following a stressful event for her. She reports symptoms of anxiety and depression and that it has increased over the past 3 months. According to pt, around 1 month ago, she began noticing difficulty with her memory and other thinking skills. When asked about medical management of anxiety/depression, she reported she was on medication for 1-2 weeks and decided to cease taking it due to "feeling fatigued/ not like herself". SLP rec she reaches out to doctor due to the symptoms described - "scared of everything" "feeling drug down" "I don't laugh or smile anymore" "I can't even order my lotion because I don't want to talk to anyone on the phone". Upon informal assessment during case hx questions, pt exhibited occasional word finding, but several disfluencies. Disfluencies were noted as the following: blocks and prolongations. SLP trialed using a "pull out" technique, where pt stops the stutter, relaxes tension, and stretches her speech. This was successful in creating more fluent speech. SLP screened pt's thinking skills using the SLUMS. Pt scored a 18/30 with deficits noted in attention, processing, short term memory, and executive functioning skills. Pt also required signficant assistance to navigate back home (pt drove to therapy alone) via use of mapquest and verbal  directions. SLP assessed pt using [place] (BNT), pt required assistance on 12/15 words indicating anomia. SLP suspects this is due to extended processing and auditory memory when asked a question *  forgetting what she wanted to say*. SLP suspects these deficits are stemming from cognitive-communication deficit possibily related to the increase "anxiety/depression" pt has been describing. SLP rec skilled speech services to address anomia, disfluency and provide cognitive strategies to assist with decline. SLP rec follow up with doc regarding options for managing increased sadness and anxiety.    Speech Therapy Frequency 1x /week    Duration 8 weeks    Treatment/Interventions Cueing hierarchy;Functional tasks;Patient/family education;Environmental controls;Cognitive reorganization;Compensatory techniques;Internal/external aids;SLP instruction and feedback;Language facilitation;Compensatory strategies    Potential to Achieve Goals Good    Consulted and Agree with Plan of Care Patient             Patient will benefit from skilled therapeutic intervention in order to improve the following deficits and impairments:   Stuttering  Cognitive communication deficit    Problem List Patient Active Problem List   Diagnosis Date Noted   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   Functional dyspepsia 09/08/2017   Slow transit constipation 09/08/2017   S/P placement of cardiac pacemaker    [place] II 02/20/2016   Nystagmus, end-position 02/12/2016   Dizziness and giddiness 02/12/2016   Flapping tremor 02/12/2016   Chronic fatigue 10/28/2015   PCP NOTES >>>> 02/13/2015   TBI (traumatic brain injury) ([place]) 01/28/2015   Essential hypertension    Acute post-traumatic headache, not intractable    Skull fracture (H[facility])    Vertigo due to brain injury ([place])    Sunrise (subarachnoid hemorrhage) ([place]) 01/21/2015   Annual physical exam 09/07/2014   Orthostatic hypotension 08/30/2014    Acromioclavicular arthrosis 03/21/2014   Allergic rhinitis 02/27/2014   SI (sacroiliac) joint dysfunction 02/02/2014   Gastrocnemius strain, left 12/11/2013   Back pain 08/22/2013   LBBB (left bundle branch block) 05/13/2013   Shingles outbreak 12/15/2012   Borderline diabetes    Insomnia 01/12/2008   HLD (hyperlipidemia) 02/04/2007   Anxiety 10/12/2006   Osteoarthritis 10/12/2006    Andrell Bergeson B.S. Communication Sciences and Disorders  11/07/2020, 8:56 AM  [place]. [place], [place], [address] Phone: [phone]   Fax:  [phone]  Name: Markeita KEYLANI ILYAS MRN: [id number] Date of Birth: 1945/12/15

## 2020-11-07 NOTE — Addendum Note (Signed)
Addended by: Royston Sinner L on: 11/07/2020 09:01 AM   Modules accepted: Orders

## 2020-11-08 DIAGNOSIS — M5459 Other low back pain: Secondary | ICD-10-CM | POA: Diagnosis not present

## 2020-11-20 ENCOUNTER — Ambulatory Visit: Payer: PPO | Attending: Adult Health | Admitting: Speech Pathology

## 2020-11-20 ENCOUNTER — Encounter: Payer: Self-pay | Admitting: Speech Pathology

## 2020-11-20 ENCOUNTER — Other Ambulatory Visit: Payer: Self-pay

## 2020-11-20 DIAGNOSIS — F8081 Childhood onset fluency disorder: Secondary | ICD-10-CM | POA: Insufficient documentation

## 2020-11-20 NOTE — Therapy (Signed)
[facility]. [place], [place], [address] Phone: [phone]   Fax:  [phone]  Speech Language Pathology Treatment  Patient Details  Name: Mandy Durham MRN: [id number] Date of Birth: [birth year] No data recorded  Encounter Date: 11/20/2020   End of Session - 11/20/20 1559     Visit Number 2    Number of Visits 8    Date for SLP Re-Evaluation 01/06/21    SLP Start Time 1400    SLP Stop Time  1450    SLP Time Calculation (min) 50 min    Activity Tolerance Patient tolerated treatment well             Past Medical History:  Diagnosis Date   Anxiety state, unspecified    Hyperlipidemia    Hypertension    Insomnia    LBBB (left bundle branch block)    LHC in 2002 showed normal coronaries.    Osteoarthrosis, unspecified whether generalized or localized, unspecified site    Overweight(278.02)    S/P placement of cardiac pacemaker    a. 02/21/16: Medtronic Advisa DR MRI SureScan model [id number] (serial number [id number])    Serrated adenoma of colon 2007   Symptomatic menopausal or female climacteric states    Syncope and collapse    11/12: Holter 12/12 with rare PACS, HR range 64-140, average 82, no significant arrhythmias. Echo (1/13): EF 50-55%, mild LVH, septal-lateral dyssynchrony c/w LBBB. 3-week event monitor (1/13): No significant arrhythmia.     Past Surgical History:  Procedure Laterality Date   COLONOSCOPY  2007   EP IMPLANTABLE DEVICE N/A 02/21/2016   Procedure: Pacemaker Implant;  Surgeon: Will Meredith Leeds, MD;  Location: [facility];  Service: Cardiovascular;  Laterality: N/A;   POLYPECTOMY      There were no vitals filed for this visit.   Subjective Assessment - 11/20/20 1557     Subjective Pt reports still feeling sad and anxious.    Currently in Pain? No/denies                   ADULT SLP TREATMENT - 11/20/20 0001       General Information   Behavior/Cognition  Cooperative;Pleasant mood      Treatment Provided   Treatment provided Cognitive-Linquistic      Cognitive-Linquistic Treatment   Treatment focused on Cognition    Skilled Treatment Pt was trained on stuttering modification techniques to ease moments of stuttering. Pt was instructed to practice diaphragmatic breathing to ease tension when moments of stuttering occur. SLP trained pt on "stretchy speech" to increase smoothness of single words. Pt expressed her concern with speaking on the phone due to the anxiety associated with stuttering. Pt required modA to use this strategy in conversational speech. SLP encouraged pt to practice writing down information that is needed when speaking on the phone to reduce anxiety associated with stutter.      Assessment / Recommendations / Plan   Plan Continue with current plan of care      Progression Toward Goals   Progression toward goals Progressing toward goals                SLP Short Term Goals - 11/20/20 1553       SLP SHORT TERM GOAL #1   Title Pt will recall and demonstrate use of 2-3 memory strategies to increase recall of important information minA.    Time 4    Period Weeks  Status On-going      SLP SHORT TERM GOAL #2   Title Pt will recall 2-3 word finding strategies for use in instances of anomia with minA.    Time 4    Period Weeks    Status On-going      SLP SHORT TERM GOAL #3   Title Pt will integrate fluency enhancing strategies to facilitate more fluent speech (i.e. relaxed breath, stretched speech, slow rate) with minA.    Time 4    Period Weeks    Status On-going    Target Date 12/05/20              SLP Long Term Goals - 11/20/20 1555       SLP LONG TERM GOAL #1   Title Pt will report successful use of memory strategies to increase ability to recall information at home.    Time 8    Period Weeks    Status On-going      SLP LONG TERM GOAL #2   Title Pt will report successful use of word finding  strategies to decrease anomia in conversation.    Time 8    Period Weeks    Status On-going      SLP LONG TERM GOAL #3   Title Pt will demonstrate increase in fluent speech by utilizing fluency enhancing strateiges to increase confidence in conversation with <2 prompts from therapist.    Time 8    Period Weeks    Status On-going              Plan - 11/20/20 1552     Clinical Impression Statement See tx note. Pt responded well to cues for stuttering modification techniques. Cont with current POC.    Speech Therapy Frequency 1x /week    Duration 8 weeks    Treatment/Interventions Cueing hierarchy;Functional tasks;Patient/family education;Environmental controls;Cognitive reorganization;Compensatory techniques;Internal/external aids;SLP instruction and feedback;Language facilitation;Compensatory strategies    Potential to Achieve Goals Good    Consulted and Agree with Plan of Care Patient             Patient will benefit from skilled therapeutic intervention in order to improve the following deficits and impairments:   Stuttering    Problem List Patient Active Problem List   Diagnosis Date Noted   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[facility]) 09/07/2018   Functional dyspepsia 09/08/2017   Slow transit constipation 09/08/2017   S/P placement of cardiac pacemaker    Mobitz II 02/20/2016   Nystagmus, end-position 02/12/2016   Dizziness and giddiness 02/12/2016   Flapping tremor 02/12/2016   Chronic fatigue 10/28/2015   PCP NOTES >>>> 02/13/2015   TBI (traumatic brain injury) ([facility]) 01/28/2015   Essential hypertension    Acute post-traumatic headache, not intractable    Skull fracture ([facility])    Vertigo due to brain injury ([facility])    [place] (subarachnoid hemorrhage) ([facility] [place]) 01/21/2015   Annual physical exam 09/07/2014   Orthostatic hypotension 08/30/2014   Acromioclavicular arthrosis 03/21/2014   Allergic rhinitis 02/27/2014   SI (sacroiliac) joint dysfunction 02/02/2014   Gastrocnemius  strain, left 12/11/2013   Back pain 08/22/2013   LBBB (left bundle branch block) 05/13/2013   Shingles outbreak 12/15/2012   Borderline diabetes    Insomnia 01/12/2008   HLD (hyperlipidemia) 02/04/2007   Anxiety 10/12/2006   Osteoarthritis 10/12/2006    Lillyth Spong B.S. Communication Sciences and Disorders  11/20/2020, 4:03 PM  [facility]. [place], [place], [address] Phone: [phone]  Fax:  [phone]   Name: Mandy Durham MRN: [id number] Date of Birth: [birth year]

## 2020-11-21 DIAGNOSIS — M545 Low back pain, unspecified: Secondary | ICD-10-CM | POA: Diagnosis not present

## 2020-11-21 DIAGNOSIS — M5459 Other low back pain: Secondary | ICD-10-CM | POA: Diagnosis not present

## 2020-11-26 ENCOUNTER — Other Ambulatory Visit: Payer: Self-pay | Admitting: Orthopedic Surgery

## 2020-11-26 ENCOUNTER — Encounter: Payer: Self-pay | Admitting: Gastroenterology

## 2020-11-26 DIAGNOSIS — M545 Low back pain, unspecified: Secondary | ICD-10-CM

## 2020-11-26 DIAGNOSIS — H26493 Other secondary cataract, bilateral: Secondary | ICD-10-CM | POA: Diagnosis not present

## 2020-11-29 ENCOUNTER — Ambulatory Visit: Payer: PPO | Admitting: Internal Medicine

## 2020-11-29 ENCOUNTER — Encounter: Payer: PPO | Admitting: Internal Medicine

## 2020-12-04 ENCOUNTER — Ambulatory Visit: Payer: PPO | Admitting: Speech Pathology

## 2020-12-04 ENCOUNTER — Other Ambulatory Visit: Payer: Self-pay

## 2020-12-04 ENCOUNTER — Encounter: Payer: Self-pay | Admitting: Speech Pathology

## 2020-12-04 DIAGNOSIS — F8081 Childhood onset fluency disorder: Secondary | ICD-10-CM

## 2020-12-04 NOTE — Therapy (Addendum)
[facility]. [place], [place], [address] Phone: [phone]   Fax:  [phone]  Speech Language Pathology Treatment  Patient Details  Name: Mandy Durham MRN: [id number] Date of Birth: 08/18/1945 No data recorded  Encounter Date: 12/04/2020   End of Session - 12/04/20 1452     Visit Number 3    Number of Visits 8    Date for SLP Re-Evaluation 01/06/21    SLP Start Time 1400    SLP Stop Time  1440    SLP Time Calculation (min) 40 min    Activity Tolerance Patient tolerated treatment well             Past Medical History:  Diagnosis Date   Anxiety state, unspecified    Hyperlipidemia    Hypertension    Insomnia    LBBB (left bundle branch block)    LHC in 2002 showed normal coronaries.    Osteoarthrosis, unspecified whether generalized or localized, unspecified site    Overweight(278.02)    S/P placement of cardiac pacemaker    a. 02/21/16: Medtronic Advisa DR MRI SureScan model [id number] (serial number [id number])    Serrated adenoma of colon 2007   Symptomatic menopausal or female climacteric states    Syncope and collapse    11/12: Holter 12/12 with rare PACS, HR range 64-140, average 82, no significant arrhythmias. Echo (1/13): EF 50-55%, mild LVH, septal-lateral dyssynchrony c/w LBBB. 3-week event monitor (1/13): No significant arrhythmia.     Past Surgical History:  Procedure Laterality Date   COLONOSCOPY  2007   EP IMPLANTABLE DEVICE N/A 02/21/2016   Procedure: Pacemaker Implant;  Surgeon: Will Meredith Leeds, MD;  Location: [facility];  Service: Cardiovascular;  Laterality: N/A;   POLYPECTOMY      There were no vitals filed for this visit.   Subjective Assessment - 12/04/20 1450     Subjective Pt reports better mood and feeling more in control of her speech.    Currently in Pain? Yes    Pain Score 4     Pain Location Back    Pain Orientation Mid;Lower    Pain Descriptors /  Indicators Aching    Pain Onset More than a month ago    Pain Frequency Intermittent                   ADULT SLP TREATMENT - 12/04/20 1502       General Information   Behavior/Cognition Cooperative;Pleasant mood      Cognitive-Linquistic Treatment   Treatment focused on Other (comment)   Fluency   Skilled Treatment Pt reported success with deep breathing exercises and use of "stretchy speech" to assist her in moments of stuttering. Pt practiced diaphragmatic breathing with SLP to ease tension in the vocal when moments of stuttering occur. Pt completed this task with spv. Pt was instructed to use "stretchy speech" while reading a list of words. Pt was able to create sentences and used "stretchy speech" during moments of stuttering with modA. SLP noted that pt benefited from deep breaths in order to properly use stretchy speech. SLP noted that pt exhibited the most difficulty with fricative sounds ( sh, f, th, s) at the word, sentence, and conversational level of speech.      Assessment / Recommendations / Plan   Plan Continue with current plan of care      Progression Toward Goals   Progression toward goals Progressing toward goals  SLP Short Term Goals - 12/04/20 1452       SLP SHORT TERM GOAL #1   Title Pt will recall and demonstrate use of 2-3 memory strategies to increase recall of important information minA.    Time 4    Period Weeks    Status Deferred   Pt wants to focus on her speech     SLP SHORT TERM GOAL #2   Title Pt will recall 2-3 word finding strategies for use in instances of anomia with minA.    Time 4    Period Weeks    Status Deferred   Pt wants to focus on her speech     SLP SHORT TERM GOAL #3   Title Pt will integrate fluency enhancing strategies to facilitate more fluent speech at sentence level (i.e. relaxed breath, stretched speech, slow rate) with minA.    Time 4    Period Weeks    Status Revised    Target Date 12/18/20               SLP Long Term Goals - 12/04/20 1454       SLP LONG TERM GOAL #1   Title Pt will report successful use of memory strategies to increase ability to recall information at home.    Time 8    Period Weeks    Status Deferred   Pt wants to focus on her speech     SLP LONG TERM GOAL #2   Title Pt will report successful use of word finding strategies to decrease anomia in conversation.    Time 8    Period Weeks    Status Deferred   Pt wants to focus on her speech     SLP LONG TERM GOAL #3   Title Pt will demonstrate increase in fluent speech by utilizing fluency enhancing strateiges to increase confidence in conversation with <2 prompts from therapist.    Time 8    Period Weeks    Status On-going    Target Date 01/02/21              Plan - 12/04/20 1533     Clinical Impression Statement See tx note. Pt responded well to cues for stuttering modification techniques. She reports feeling more confident after leaving therapy each day. Cont with current POC.    Speech Therapy Frequency 1x /week    Duration 8 weeks    Treatment/Interventions Cueing hierarchy;Functional tasks;Patient/family education;Environmental controls;Cognitive reorganization;Compensatory techniques;Internal/external aids;SLP instruction and feedback;Language facilitation;Compensatory strategies    Potential to Achieve Goals Good    Consulted and Agree with Plan of Care Patient             Patient will benefit from skilled therapeutic intervention in order to improve the following deficits and impairments:   Stuttering    Problem List Patient Active Problem List   Diagnosis Date Noted   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[facility]) 09/07/2018   Functional dyspepsia 09/08/2017   Slow transit constipation 09/08/2017   S/P placement of cardiac pacemaker    Mobitz II 02/20/2016   Nystagmus, end-position 02/12/2016   Dizziness and giddiness 02/12/2016   Flapping tremor 02/12/2016   Chronic fatigue  10/28/2015   PCP NOTES >>>> 02/13/2015   TBI (traumatic brain injury) ([place] [facility]) 01/28/2015   Essential hypertension    Acute post-traumatic headache, not intractable    Skull fracture ([facility])    Vertigo due to brain injury ([facility])    [place] (subarachnoid hemorrhage) ([facility]) 01/21/2015   Annual physical exam  09/07/2014   Orthostatic hypotension 08/30/2014   Acromioclavicular arthrosis 03/21/2014   Allergic rhinitis 02/27/2014   SI (sacroiliac) joint dysfunction 02/02/2014   Gastrocnemius strain, left 12/11/2013   Back pain 08/22/2013   LBBB (left bundle branch block) 05/13/2013   Shingles outbreak 12/15/2012   Borderline diabetes    Insomnia 01/12/2008   HLD (hyperlipidemia) 02/04/2007   Anxiety 10/12/2006   Osteoarthritis 10/12/2006    Sydney L [place] B.S. Communication Sciences and Disorders  12/04/2020, 3:34 PM  [facility]. [place], [place], [address] Phone: [phone]   Fax:  [phone]   Name: Mandy Durham MRN: [id number] Date of Birth: [birth year]

## 2020-12-07 ENCOUNTER — Ambulatory Visit
Admission: RE | Admit: 2020-12-07 | Discharge: 2020-12-07 | Disposition: A | Discharge status: Home / Self Care | Payer: PPO | Source: Ambulatory Visit | Attending: Orthopedic Surgery | Admitting: Orthopedic Surgery

## 2020-12-07 DIAGNOSIS — M545 Low back pain, unspecified: Secondary | ICD-10-CM

## 2020-12-07 DIAGNOSIS — M4316 Spondylolisthesis, lumbar region: Secondary | ICD-10-CM | POA: Diagnosis not present

## 2020-12-07 DIAGNOSIS — M4186 Other forms of scoliosis, lumbar region: Secondary | ICD-10-CM | POA: Diagnosis not present

## 2020-12-13 ENCOUNTER — Telehealth: Payer: Self-pay | Admitting: Internal Medicine

## 2020-12-13 NOTE — Telephone Encounter (Signed)
Left message for patient to call back and schedule Medicare Annual Wellness Visit (AWV) in office.   If not able to come in office, please offer to do virtually or by telephone.  Left office number and my jabber [phone].  Last AWV:09/07/2014  Please schedule at anytime with Nurse Health Advisor.

## 2020-12-18 ENCOUNTER — Ambulatory Visit: Payer: PPO | Admitting: Speech Pathology

## 2020-12-24 DIAGNOSIS — M5451 Vertebrogenic low back pain: Secondary | ICD-10-CM | POA: Diagnosis not present

## 2020-12-27 DIAGNOSIS — H26493 Other secondary cataract, bilateral: Secondary | ICD-10-CM | POA: Diagnosis not present

## 2020-12-27 LAB — HM DIABETES EYE EXAM

## 2021-01-03 ENCOUNTER — Telehealth: Payer: Self-pay | Admitting: Cardiology

## 2021-01-03 NOTE — Telephone Encounter (Signed)
Pt reports no current chest discomfort/pain. She did have one epiosde earlier this afternoon. Occurred yesterday as well. Epiosode/s last longer than an hour, pain comes and goes. She reports that after she naps it goes away. No radiating pain, SOB, syncope, only symptoms is discomfort, HA, dizziness/light headedness. Pt has no cardiac hx other than a PPM. She reports that she has nitro that Dr Larose Kells prescribed but she has not taken it nor does she know why he prescribed it. Pt scheduled to see APP next week. Advised to contact office/call 911 if pain worsens/changes in nature. Patient verbalized understanding and agreeable to plan.

## 2021-01-03 NOTE — Telephone Encounter (Signed)
Pt c/o of Chest Pain: STAT if CP now or developed within 24 hours  1. Are you having CP right now? CHEST TIGHTNESS NOT RIGHT NOW BUT PT HAD IT YESTERDAY  2. Are you experiencing any other symptoms (ex. SOB, nausea, vomiting, sweating)? SOB ONLY  3. How long have you been experiencing CP? 7 WEEKS OR SO   4. Is your CP continuous or coming and going? COMING AND GOING  5. Have you taken Nitroglycerin? PT HAVE NOT HAD TO TAKE NITRO ?

## 2021-01-11 NOTE — Progress Notes (Deleted)
Cardiology Office Note Date:  01/11/2021  Patient ID:  Mandy Durham, Mandy Durham Aug 11, 1945, MRN [id number] PCP:  Colon Branch, MD  Cardiologist: Dr. Aundra Dubin [phone]  Electrophysiologist:  Dr. Curt Bears    Chief Complaint:  *** CP  History of Present Illness: Mandy Durham is a 75 y.o. female with history of HTN, HLD, LBBB (since around 2000, remote notes report LHC in 2002 showed normal coronaries), traumatic SAH (2016)advanced heart block w/PPM. She has a stuttering speech pattern and a R hand/forearm tremor she state since the time of her head injury.  I saw her 10/06/19, for CP She reports for the last couple months getting SOB easily.  Reports that before she had no difficulties with her ADLs, gives an example of walking to her mail box and back without SOB and now has to catch her breath at the mailbox to walk back, and is SOB once back at the house.  When asked how far a walk it is she reports "not too far" She will get winded with some house chores, not others.  This very unusual for her. When asked about CP, she says no, just very SOB. Discussed if any heaviness, pressure, she says no. She mentions that she told Dr. Curt Bears last time here and he stopped one of her medicines but that did not help.  When I tell her that was end of March she is surprised, states she did not think she had been feeling poorly that long. She is certain and firm that she is not having CP, but the SOB does make her anxious No rest SOB, no dizzy spells, near syncope or syncope  She denies any swelling ,bolating, no symptoms of orthopnea or PND. Noted VP only 2%, planned for an echo and stress test to evaluate her progressive DOE Stress test was low risk with no ischemia or prior infarct Echo looked OK, LVEF 50-55%, Anteroseptal and inferoseptal hypokinesis.  Left ventricular diastolic parameters are consistent with Grade I diastolic dysfunction (impaired relaxation). Elevated left ventricular end-diastolic  pressure. RV OK, no significant VHD She was started on lasix and then uptitrated and planned for follow up f/u BMET was stable  F/u 12/04/19 She feels OK, thinks the lasix has helped, still has some days that she feels a little winded with activities, but all in all better. No CP, palpitations No rest SOB, no symptoms of PND or orthopnea, though wakes a few times to urinate. Since our last visit she has hd once or twice both while seated a very fleeting/sudden dizzy spell that she though she was blacking out.  Over a fast as it started. No changes, labs done, planned to monitor for recurrent symptoms  She has seem Dr. Curt Bears subsequently doing well, no changes made at his visit  More recently saw A. [place], [place], march 2022 he discussed worsening SOB over the year despiet  and referred to pulmonology given unremarkable cardiac work up.  She called the office recently with c/o CP and given an appt to come in and be seen.  *** pulmonary?? *** symptoms, CP, SOB *** volume *** Aug 2021 echo/streess pretty good with some DD *** coronary CT *** nitrates *** rate response?    Device information MDT dual chamber PPM implanted 02/21/2016  Past Medical History:  Diagnosis Date   Anxiety state, unspecified    Hyperlipidemia    Hypertension    Insomnia    LBBB (left bundle branch block)    LHC in 2002 showed  normal coronaries.    Osteoarthrosis, unspecified whether generalized or localized, unspecified site    Overweight(278.02)    S/P placement of cardiac pacemaker    a. 02/21/16: Medtronic Advisa DR MRI SureScan model [id number] (serial number [id number])    Serrated adenoma of colon 2007   Symptomatic menopausal or female climacteric states    Syncope and collapse    11/12: Holter 12/12 with rare PACS, HR range 64-140, average 82, no significant arrhythmias. Echo (1/13): EF 50-55%, mild LVH, septal-lateral dyssynchrony c/w LBBB. 3-week event monitor (1/13): No significant arrhythmia.      Past Surgical History:  Procedure Laterality Date   COLONOSCOPY  2007   EP IMPLANTABLE DEVICE N/A 02/21/2016   Procedure: Pacemaker Implant;  Surgeon: Will Meredith Leeds, MD;  Location: [facility];  Service: Cardiovascular;  Laterality: N/A;   POLYPECTOMY      Current Outpatient Medications  Medication Sig Dispense Refill   acetaminophen (TYLENOL) 500 MG tablet Take 500 mg by mouth every 6 (six) hours as needed.     aspirin EC 81 MG tablet Take 81 mg by mouth daily. Swallow whole.     carbidopa-levodopa (SINEMET IR) 25-100 MG tablet Take 1 tablet by mouth 3 (three) times daily. 270 tablet 3   Cholecalciferol (VITAMIN D PO) Take 1 tablet by mouth 2 (two) times a week. Vitamin D     escitalopram (LEXAPRO) 5 MG tablet escitalopram 5 mg tablet  TAKE 1 TABLET BY MOUTH AT BEDTIME (Patient not taking: Reported on 10/28/2020)     ezetimibe (ZETIA) 10 MG tablet Take 1 tablet (10 mg total) by mouth daily. 90 tablet 1   furosemide (LASIX) 20 MG tablet Take 2 tablets (40 mg total) by mouth daily. 90 tablet 3   LINZESS 145 MCG CAPS capsule Take 145 mcg by mouth every morning. (Patient not taking: Reported on 10/28/2020)     losartan (COZAAR) 100 MG tablet Take 1 tablet (100 mg total) by mouth daily. 30 tablet 3   nitroGLYCERIN (NITROSTAT) 0.4 MG SL tablet Place 1 tablet (0.4 mg total) under the tongue every 5 (five) minutes x 3 doses as needed for chest pain. 25 tablet 1   potassium chloride (KLOR-CON) 10 MEQ tablet potassium chloride ER 10 mEq tablet,extended release  TAKE 1 TABLET BY MOUTH ONCE DAILY     No current facility-administered medications for this visit.    Allergies:   Patient has no known allergies.   Social History:  The patient  reports that she has never smoked. She has never used smokeless tobacco. She reports that she does not drink alcohol and does not use drugs.   Family History:  The patient's family history includes Coronary artery disease in her mother; Dementia  in her mother; Diabetes in her mother; Heart failure in her mother.  ROS:  Please see the history of present illness.  All other systems are reviewed and otherwise negative.   PHYSICAL EXAM:  VS:  There were no vitals taken for this visit. BMI: There is no height or weight on file to calculate BMI. Well nourished, well developed, in no acute distress  HEENT: normocephalic, atraumatic  Neck: no JVD, carotid bruits or masses Cardiac:  *** RRR; soft SM, no rubs, or gallops Lungs:  *** CTA b/l, no wheezing, rhonchi or rales  Abd: soft, nontender MS: no deformity, thin body habitus, age appropriate atrophy Ext:  *** no edema  Skin: warm and dry, no rash Neuro:  She has a  stuttered speech pattern and a resting R hand/forearm tremor, otherwise no gross deficits appreciated Psych: euthymic mood, full affect  *** PPM site is stable, no tethering or discomfort   EKG:  Done today and reviewed by myself ***  PPM interrogation done today and reviewed by myself: ***  10/20/2019: TTE IMPRESSIONS  1. Anteroseptal and inferoseptal hypokinesis. Left ventricular ejection  fraction, by estimation, is 50 to 55%. The left ventricle has low normal  function. The left ventricle demonstrates regional wall motion  abnormalities (see scoring diagram/findings  for description). Left ventricular diastolic parameters are consistent  with Grade I diastolic dysfunction (impaired relaxation). Elevated left  ventricular end-diastolic pressure.   2. Right ventricular systolic function is normal. The right ventricular  size is normal. There is normal pulmonary artery systolic pressure.   3. Left atrial size was mildly dilated.   4. The mitral valve is normal in structure. Trivial mitral valve  regurgitation. No evidence of mitral stenosis.   5. Tricuspid valve regurgitation is mild to moderate.   6. The aortic valve is tricuspid. Aortic valve regurgitation is not  visualized. No aortic stenosis is present.    7. The inferior vena cava is normal in size with greater than 50%  respiratory variability, suggesting right atrial pressure of 3 mmHg.     10/20/2019; lexiscan stress myoview Nuclear stress EF: 50%. The left ventricular ejection fraction is mildly decreased (45-54%). Visually, the EF appears greater than the 50% computer generated EF There was no ST segment deviation noted during stress. This is a low risk study. There is no evidence of ischemia and no evidence of previous infarction. The study is normal.    TTE 07/17/15  Review of the above records today demonstrates:  - Left ventricle: The cavity size was normal. There was severe   focal basal hypertrophy of the septum. Systolic function was   normal. The estimated ejection fraction was in the range of 50%   to 55%. Anteroseptal hypokinesis. Doppler parameters are   consistent with abnormal left ventricular relaxation (grade 1   diastolic dysfunction). The E/e&' ratio is between 8-15,   suggesting indeterminate LV filling pressure. - Mitral valve: Mildly thickened leaflets . There was trivial   regurgitation. - Left atrium: The atrium was normal in size. - Tricuspid valve: There was mild regurgitation. - Pulmonary arteries: PA peak pressure: 32 mm Hg (S). - Inferior vena cava: The vessel was normal in size. The   respirophasic diameter changes were in the normal range (>= 50%),   consistent with normal central venous pressure.  Recent Labs: 06/07/2020: ALT 16; BUN 21; Creat 0.85; Hemoglobin 14.2; Platelets 276; Potassium 4.1; Sodium 140; TSH 1.31  06/07/2020: Cholesterol 234; HDL 119; LDL Cholesterol (Calc) 100; Total CHOL/HDL Ratio 2.0; Triglycerides 66   CrCl cannot be calculated (Patient's most recent lab result is older than the maximum 21 days allowed.).   Wt Readings from Last 3 Encounters:  10/28/20 117 lb 6.4 oz (53.3 kg)  08/28/20 120 lb (54.4 kg)  07/19/20 120 lb 3.2 oz (54.5 kg)     Other studies  reviewed: Additional studies/records reviewed today include: summarized above  ASSESSMENT AND PLAN:  1. PPM     *** Intact function     *** no programming changes amde  2. HTN     *** Looks OK  3. Diastolic dysfunction    ***  4. CP ***             Disposition: I***  Current medicines are reviewed at length with the patient today.  The patient did not have any concerns regarding medicines.  Venetia Night, PA-C 01/11/2021 5:11 PM     [place] [address] [phone] (office)  [phone] (fax)

## 2021-01-13 DIAGNOSIS — Z9849 Cataract extraction status, unspecified eye: Secondary | ICD-10-CM | POA: Diagnosis not present

## 2021-01-14 ENCOUNTER — Encounter: Payer: PPO | Admitting: Physician Assistant

## 2021-01-16 NOTE — Progress Notes (Signed)
Cardiology Office Note Date:  01/16/2021  Patient ID:  Mandy Durham, Mandy Durham [birth year], MRN [id number] PCP:  Colon Branch, MD  Cardiologist: Dr. Aundra Dubin [phone])  Electrophysiologist:  Dr. Curt Bears    Chief Complaint:  SOB  History of Present Illness: Mandy Durham is a 75 y.o. female with history of HTN, HLD, LBBB (since around 2000, remote notes report LHC in 2002 showed normal coronaries), traumatic SAH (2016)advanced heart block w/PPM. She has a stuttering speech pattern and a R hand/forearm tremor she state since the time of her head injury.  I saw her 10/06/19, for CP She reports for the last couple months getting SOB easily.  Reports that before she had no difficulties with her ADLs, gives an example of walking to her mail box and back without SOB and now has to catch her breath at the mailbox to walk back, and is SOB once back at the house.  When asked how far a walk it is she reports "not too far" She will get winded with some house chores, not others.  This very unusual for her. When asked about CP, she says no, just very SOB. Discussed if any heaviness, pressure, she says no. She mentions that she told Dr. Curt Bears last time here and he stopped one of her medicines but that did not help.  When I tell her that was end of March she is surprised, states she did not think she had been feeling poorly that long. She is certain and firm that she is not having CP, but the SOB does make her anxious No rest SOB, no dizzy spells, near syncope or syncope  She denies any swelling ,bolating, no symptoms of orthopnea or PND. Noted VP only 2%, planned for an echo and stress test to evaluate her progressive DOE Stress test was low risk with no ischemia or prior infarct Echo looked OK, LVEF 50-55%, Anteroseptal and inferoseptal hypokinesis.  Left ventricular diastolic parameters are consistent with Grade I diastolic dysfunction (impaired relaxation). Elevated left ventricular end-diastolic pressure. RV  OK, no significant VHD She was started on lasix and then uptitrated and planned for follow up f/u BMET was stable  F/u 12/04/19 She feels OK, thinks the lasix has helped, still has some days that she feels a little winded with activities, but all in all better. No CP, palpitations No rest SOB, no symptoms of PND or orthopnea, though wakes a few times to urinate. Since our last visit she has hd once or twice both while seated a very fleeting/sudden dizzy spell that she though she was blacking out.  Over a fast as it started. No changes, labs done, planned to monitor for recurrent symptoms  She has seen Dr. Curt Bears subsequently doing well, no changes made at his visit  More recently saw A. Albertina [place], [place], March 2022 he discussed worsening SOB over the year despiet  and referred to pulmonology given unremarkable cardiac work up.  She called the office recently with c/o CP and given an appt to come in and be seen.  TODAY She feels pretty lousy, says her SOB is significant and she can hardly take care of the day to day activities. That being said, she reports that she has felt this way at least 60mo, since she saw Jonni Sanger, and has been SOB for more then a year. SHe is clear that how she feels today is how she felt in March, not particularly escalating, but none better.  She saw pulmonology and reports that  she was supposed to be getting a test done, but has not yeat  She has had some CP this is random, I nfrequent sharp and momentary  No near syncope or syncope.  She denies symptoms of orthopnea, sometimes she feels SOB seated and standing up seems to help, though her SOB is always the worst when exerting herself, settles after a few minutes   No palpitations   Device information MDT dual chamber PPM implanted 02/21/2016  Past Medical History:  Diagnosis Date   Anxiety state, unspecified    Hyperlipidemia    Hypertension    Insomnia    LBBB (left bundle branch block)    LHC in 2002 showed  normal coronaries.    Osteoarthrosis, unspecified whether generalized or localized, unspecified site    Overweight(278.02)    S/P placement of cardiac pacemaker    a. 02/21/16: Medtronic Advisa DR MRI SureScan model [id number] (serial number [id number])    Serrated adenoma of colon 2007   Symptomatic menopausal or female climacteric states    Syncope and collapse    11/12: Holter 12/12 with rare PACS, HR range 64-140, average 82, no significant arrhythmias. Echo (1/13): EF 50-55%, mild LVH, septal-lateral dyssynchrony c/w LBBB. 3-week event monitor (1/13): No significant arrhythmia.     Past Surgical History:  Procedure Laterality Date   COLONOSCOPY  2007   EP IMPLANTABLE DEVICE N/A 02/21/2016   Procedure: Pacemaker Implant;  Surgeon: Will Meredith Leeds, MD;  Location: [facility];  Service: Cardiovascular;  Laterality: N/A;   POLYPECTOMY      Current Outpatient Medications  Medication Sig Dispense Refill   acetaminophen (TYLENOL) 500 MG tablet Take 500 mg by mouth every 6 (six) hours as needed.     aspirin EC 81 MG tablet Take 81 mg by mouth daily. Swallow whole.     carbidopa-levodopa (SINEMET IR) 25-100 MG tablet Take 1 tablet by mouth 3 (three) times daily. 270 tablet 3   Cholecalciferol (VITAMIN D PO) Take 1 tablet by mouth 2 (two) times a week. Vitamin D     escitalopram (LEXAPRO) 5 MG tablet escitalopram 5 mg tablet  TAKE 1 TABLET BY MOUTH AT BEDTIME (Patient not taking: Reported on 10/28/2020)     ezetimibe (ZETIA) 10 MG tablet Take 1 tablet (10 mg total) by mouth daily. 90 tablet 1   furosemide (LASIX) 20 MG tablet Take 2 tablets (40 mg total) by mouth daily. 90 tablet 3   LINZESS 145 MCG CAPS capsule Take 145 mcg by mouth every morning. (Patient not taking: Reported on 10/28/2020)     losartan (COZAAR) 100 MG tablet Take 1 tablet (100 mg total) by mouth daily. 30 tablet 3   nitroGLYCERIN (NITROSTAT) 0.4 MG SL tablet Place 1 tablet (0.4 mg total) under the tongue every 5  (five) minutes x 3 doses as needed for chest pain. 25 tablet 1   potassium chloride (KLOR-CON) 10 MEQ tablet potassium chloride ER 10 mEq tablet,extended release  TAKE 1 TABLET BY MOUTH ONCE DAILY     No current facility-administered medications for this visit.    Allergies:   Patient has no known allergies.   Social History:  The patient  reports that she has never smoked. She has never used smokeless tobacco. She reports that she does not drink alcohol and does not use drugs.   Family History:  The patient's family history includes Coronary artery disease in her mother; Dementia in her mother; Diabetes in her mother; Heart failure in  her mother.  ROS:  Please see the history of present illness.  All other systems are reviewed and otherwise negative.   PHYSICAL EXAM:  VS:  There were no vitals taken for this visit. BMI: There is no height or weight on file to calculate BMI. Well nourished, well developed, in no acute distress, appears frail HEENT: normocephalic, atraumatic  Neck: no JVD, carotid bruits or masses Cardiac:  RRR; tachycardic, soft SM, no rubs, or gallops Lungs:   crackles R base, no wheezing, rhonchi or rales  Abd: soft, nontender MS: no deformity, thin body habitus, age appropriate atrophy Ext:  trace-1+ edema  Skin: warm and dry, no rash Neuro:  She has a stuttered speech pattern and a resting R hand/forearm tremor, leans to the left today, she says this has been for the last 6 months or so too, c/w  neurology, otherwise no gross deficits appreciated Psych: euthymic mood, full affect  PPM site is stable, no tethering or discomfort   EKG:  Done today and reviewed by myself ST  113bpm, ST/T changes appear similar to prior  PPM interrogation done today and reviewed by myself: Battery and lead measurements are good On AF episode, 9 seconds, not true AF HR histograms reviewed Dec,21-March,22 notes HRs in the 100-120 as well, though since then to today HR's have  shifted Rightward to more time in this territory, still noting HRs 60's-100 as well.  10/20/2019: TTE IMPRESSIONS  1. Anteroseptal and inferoseptal hypokinesis. Left ventricular ejection  fraction, by estimation, is 50 to 55%. The left ventricle has low normal  function. The left ventricle demonstrates regional wall motion  abnormalities (see scoring diagram/findings  for description). Left ventricular diastolic parameters are consistent  with Grade I diastolic dysfunction (impaired relaxation). Elevated left  ventricular end-diastolic pressure.   2. Right ventricular systolic function is normal. The right ventricular  size is normal. There is normal pulmonary artery systolic pressure.   3. Left atrial size was mildly dilated.   4. The mitral valve is normal in structure. Trivial mitral valve  regurgitation. No evidence of mitral stenosis.   5. Tricuspid valve regurgitation is mild to moderate.   6. The aortic valve is tricuspid. Aortic valve regurgitation is not  visualized. No aortic stenosis is present.   7. The inferior vena cava is normal in size with greater than 50%  respiratory variability, suggesting right atrial pressure of 3 mmHg.     10/20/2019; lexiscan stress myoview Nuclear stress EF: 50%. The left ventricular ejection fraction is mildly decreased (45-54%). Visually, the EF appears greater than the 50% computer generated EF There was no ST segment deviation noted during stress. This is a low risk study. There is no evidence of ischemia and no evidence of previous infarction. The study is normal.    TTE 07/17/15  Review of the above records today demonstrates:  - Left ventricle: The cavity size was normal. There was severe   focal basal hypertrophy of the septum. Systolic function was   normal. The estimated ejection fraction was in the range of 50%   to 55%. Anteroseptal hypokinesis. Doppler parameters are   consistent with abnormal left ventricular relaxation (grade  1   diastolic dysfunction). The E/e&' ratio is between 8-15,   suggesting indeterminate LV filling pressure. - Mitral valve: Mildly thickened leaflets . There was trivial   regurgitation. - Left atrium: The atrium was normal in size. - Tricuspid valve: There was mild regurgitation. - Pulmonary arteries: PA peak pressure: 32 mm  Hg (S). - Inferior vena cava: The vessel was normal in size. The   respirophasic diameter changes were in the normal range (>= 50%),   consistent with normal central venous pressure.  Recent Labs: 06/07/2020: ALT 16; BUN 21; Creat 0.85; Hemoglobin 14.2; Platelets 276; Potassium 4.1; Sodium 140; TSH 1.31  06/07/2020: Cholesterol 234; HDL 119; LDL Cholesterol (Calc) 100; Total CHOL/HDL Ratio 2.0; Triglycerides 66   CrCl cannot be calculated (Patient's most recent lab result is older than the maximum 21 days allowed.).   Wt Readings from Last 3 Encounters:  10/28/20 117 lb 6.4 oz (53.3 kg)  08/28/20 120 lb (54.4 kg)  07/19/20 120 lb 3.2 oz (54.5 kg)     Other studies reviewed: Additional studies/records reviewed today include: summarized above  ASSESSMENT AND PLAN:  1. PPM     Intact function     no programming changes amde  2. HTN     Looks OK  3. Diastolic dysfunction 4. Acute/chronic CHF    She has not been taking lasix daily since it was prescribed, in fact only using it once in a while when she has had a hard time urinating  Discussed case with DOD  I have asked her to take the lasix every day BMET, BNP today, clinic visit in the next 1-2 weeks. Discussed ER if needed., has worsening, develops resting symptoms.            Disposition: as above    Current medicines are reviewed at length with the patient today.  The patient did not have any concerns regarding medicines.  Venetia Night, PA-C 01/16/2021 7:37 PM     [address] [place] [address] [phone] (office)  [phone]  (fax)

## 2021-01-17 ENCOUNTER — Encounter: Payer: Self-pay | Admitting: Physician Assistant

## 2021-01-17 ENCOUNTER — Other Ambulatory Visit: Payer: Self-pay

## 2021-01-17 ENCOUNTER — Ambulatory Visit: Payer: PPO | Admitting: Physician Assistant

## 2021-01-17 VITALS — BP 126/80 | HR 113 | Ht 60.5 in | Wt 109.8 lb

## 2021-01-17 DIAGNOSIS — I5031 Acute diastolic (congestive) heart failure: Secondary | ICD-10-CM

## 2021-01-17 DIAGNOSIS — Z950 Presence of cardiac pacemaker: Secondary | ICD-10-CM

## 2021-01-17 DIAGNOSIS — R0602 Shortness of breath: Secondary | ICD-10-CM | POA: Diagnosis not present

## 2021-01-17 DIAGNOSIS — I10 Essential (primary) hypertension: Secondary | ICD-10-CM

## 2021-01-17 DIAGNOSIS — I447 Left bundle-branch block, unspecified: Secondary | ICD-10-CM

## 2021-01-17 DIAGNOSIS — I441 Atrioventricular block, second degree: Secondary | ICD-10-CM | POA: Diagnosis not present

## 2021-01-17 DIAGNOSIS — Z79899 Other long term (current) drug therapy: Secondary | ICD-10-CM

## 2021-01-17 LAB — CUP PACEART INCLINIC DEVICE CHECK
Battery Remaining Longevity: 61 mo
Battery Voltage: 3 V
Brady Statistic AP VP Percent: 2.39 %
Brady Statistic AP VS Percent: 0.39 %
Brady Statistic AS VP Percent: 65.54 %
Brady Statistic AS VS Percent: 31.68 %
Brady Statistic RA Percent Paced: 2.74 %
Brady Statistic RV Percent Paced: 69.02 %
Date Time Interrogation Session: 20221104182546
Implantable Lead Implant Date: 20171208
Implantable Lead Implant Date: 20171208
Implantable Lead Location: 753859
Implantable Lead Location: 753860
Implantable Lead Model: 5076
Implantable Lead Model: 5076
Implantable Pulse Generator Implant Date: 20171208
Lead Channel Impedance Value: 342 Ohm
Lead Channel Impedance Value: 380 Ohm
Lead Channel Impedance Value: 418 Ohm
Lead Channel Impedance Value: 494 Ohm
Lead Channel Pacing Threshold Amplitude: 0.5 V
Lead Channel Pacing Threshold Amplitude: 0.5 V
Lead Channel Pacing Threshold Pulse Width: 0.4 ms
Lead Channel Pacing Threshold Pulse Width: 0.4 ms
Lead Channel Sensing Intrinsic Amplitude: 16.25 mV
Lead Channel Sensing Intrinsic Amplitude: 2 mV
Lead Channel Sensing Intrinsic Amplitude: 2.75 mV
Lead Channel Sensing Intrinsic Amplitude: 20.625 mV
Lead Channel Setting Pacing Amplitude: 2 V
Lead Channel Setting Pacing Amplitude: 2.5 V
Lead Channel Setting Pacing Pulse Width: 0.4 ms
Lead Channel Setting Sensing Sensitivity: 4 mV

## 2021-01-17 NOTE — Patient Instructions (Addendum)
Medication Instructions:   PLEASE MAKE SURE YOUR TAKING YOUR FUROSEMIDE(LASIX) EVERYDAY   *If you need a refill on your cardiac medications before your next appointment, please call your pharmacy*   Lab Work:  BMET AND BNP TODAY    If you have labs (blood work) drawn today and your tests are completely normal, you will receive your results only by: MyChart Message (if you have MyChart) OR A paper copy in the mail If you have any lab test that is abnormal or we need to change your treatment, we will call you to review the results.   Testing/Procedures: NONE ORDERED  TODAY    Follow-Up: At [facility], you and your health needs are our priority.  As part of our continuing mission to provide you with exceptional heart care, we have created designated Provider Care Teams.  These Care Teams include your primary Cardiologist (physician) and Advanced Practice Providers (APPs -  Physician Assistants and Nurse Practitioners) who all work together to provide you with the care you need, when you need it.  We recommend signing up for the patient portal called "MyChart".  Sign up information is provided on this After Visit Summary.  MyChart is used to connect with patients for Virtual Visits (Telemedicine).  Patients are able to view lab/test results, encounter notes, upcoming appointments, etc.  Non-urgent messages can be sent to your provider as well.   To learn more about what you can do with MyChart, go to [email or web address].    Your next appointment:   2-3 week(s)   The format for your next appointment:   In Person  Provider:   Tommye Standard, PA-C Legrand Como "Oda Kilts, PA-C    Other Instructions   PLEASE GO TO [place].Marland Kitchen

## 2021-01-18 LAB — BASIC METABOLIC PANEL
BUN/Creatinine Ratio: 22 (ref 12–28)
BUN: 18 mg/dL (ref 8–27)
CO2: 24 mmol/L (ref 20–29)
Calcium: 9.4 mg/dL (ref 8.7–10.3)
Chloride: 102 mmol/L (ref 96–106)
Creatinine, Ser: 0.83 mg/dL (ref 0.57–1.00)
Glucose: 149 mg/dL — ABNORMAL HIGH (ref 70–99)
Potassium: 3.7 mmol/L (ref 3.5–5.2)
Sodium: 140 mmol/L (ref 134–144)
eGFR: 73 mL/min/{1.73_m2} (ref >59)

## 2021-01-18 LAB — PRO B NATRIURETIC PEPTIDE: NT-Pro BNP: 724 pg/mL (ref 0–738)

## 2021-01-22 ENCOUNTER — Encounter: Payer: Self-pay | Admitting: Internal Medicine

## 2021-01-22 ENCOUNTER — Ambulatory Visit (INDEPENDENT_AMBULATORY_CARE_PROVIDER_SITE_OTHER): Payer: PPO | Admitting: Internal Medicine

## 2021-01-22 ENCOUNTER — Other Ambulatory Visit: Payer: Self-pay

## 2021-01-22 VITALS — BP 140/100 | HR 88 | Temp 98.1°F | Resp 20 | Ht 60.5 in | Wt 111.4 lb

## 2021-01-22 DIAGNOSIS — F419 Anxiety disorder, unspecified: Secondary | ICD-10-CM

## 2021-01-22 DIAGNOSIS — Z23 Encounter for immunization: Secondary | ICD-10-CM | POA: Diagnosis not present

## 2021-01-22 DIAGNOSIS — R627 Adult failure to thrive: Secondary | ICD-10-CM

## 2021-01-22 DIAGNOSIS — I10 Essential (primary) hypertension: Secondary | ICD-10-CM

## 2021-01-22 DIAGNOSIS — R739 Hyperglycemia, unspecified: Secondary | ICD-10-CM

## 2021-01-22 MED ORDER — EZETIMIBE 10 MG PO TABS: 10.0000 mg | ORAL_TABLET | Freq: Every day | ORAL | 1 refills | Status: DC

## 2021-01-22 MED ORDER — LOSARTAN POTASSIUM 100 MG PO TABS: 100.0000 mg | ORAL_TABLET | Freq: Every day | ORAL | 3 refills | Status: DC

## 2021-01-22 MED ORDER — ESCITALOPRAM OXALATE 10 MG PO TABS: 10.0000 mg | ORAL_TABLET | Freq: Every day | ORAL | 6 refills | Status: DC

## 2021-01-22 NOTE — Patient Instructions (Addendum)
Increase Lexapro to 10 mg: Take 1 tablet every day  Consider get COVID-vaccine if you are having have 1 in the last 3 months  GO TO THE LAB : Get the blood work     [place], [place] back for a checkup in 4 months

## 2021-01-22 NOTE — Progress Notes (Signed)
Subjective:    Patient ID: Mandy Durham, female    DOB: April 07, 1945, 75 y.o.   MRN: [id number]  DOS:  01/22/2021 Type of visit - description: Routine follow-up   Since the last office visit has no new concerns. Saw neurology and cardiology, notes reviewed. Anxiety: Better control with Lexapro. Preventive care and failure to thrive also discussed.   Wt Readings from Last 3 Encounters:  01/22/21 111 lb 6.4 oz (50.5 kg)  01/17/21 109 lb 12.8 oz (49.8 kg)  10/28/20 117 lb 6.4 oz (53.3 kg)   BP Readings from Last 3 Encounters:  01/22/21 (!) 140/100  01/17/21 126/80  10/28/20 (!) 179/83     Review of Systems See above   Past Medical History:  Diagnosis Date   Anxiety state, unspecified    Hyperlipidemia    Hypertension    Insomnia    LBBB (left bundle branch block)    LHC in 2002 showed normal coronaries.    Osteoarthrosis, unspecified whether generalized or localized, unspecified site    Overweight(278.02)    S/P placement of cardiac pacemaker    a. 02/21/16: Medtronic Advisa DR MRI SureScan model G6YQ03 (serial number [id number])    Serrated adenoma of colon 2007   Symptomatic menopausal or female climacteric states    Syncope and collapse    11/12: Holter 12/12 with rare PACS, HR range 64-140, average 82, no significant arrhythmias. Echo (1/13): EF 50-55%, mild LVH, septal-lateral dyssynchrony c/w LBBB. 3-week event monitor (1/13): No significant arrhythmia.     Past Surgical History:  Procedure Laterality Date   COLONOSCOPY  2007   EP IMPLANTABLE DEVICE N/A 02/21/2016   Procedure: Pacemaker Implant;  Surgeon: Will Meredith Leeds, MD;  Location: [facility];  Service: Cardiovascular;  Laterality: N/A;   POLYPECTOMY      Allergies as of 01/22/2021   No Known Allergies      Medication List        Accurate as of January 22, 2021 11:59 PM. If you have any questions, ask your nurse or doctor.          STOP taking these medications    potassium  chloride 10 MEQ tablet Commonly known as: KLOR-CON Stopped by: Kathlene November, MD       TAKE these medications    acetaminophen 500 MG tablet Commonly known as: TYLENOL Take 500 mg by mouth every 6 (six) hours as needed.   aspirin EC 81 MG tablet Take 81 mg by mouth daily. Swallow whole.   carbidopa-levodopa 25-100 MG tablet Commonly known as: SINEMET IR Take 1 tablet by mouth 3 (three) times daily.   escitalopram 10 MG tablet Commonly known as: LEXAPRO Take 1 tablet (10 mg total) by mouth daily. What changed:  medication strength See the new instructions. Changed by: Kathlene November, MD   ezetimibe 10 MG tablet Commonly known as: ZETIA Take 1 tablet (10 mg total) by mouth daily.   furosemide 20 MG tablet Commonly known as: LASIX Take 2 tablets (40 mg total) by mouth daily.   Linzess 145 MCG Caps capsule Generic drug: linaclotide Take 145 mcg by mouth every morning.   losartan 100 MG tablet Commonly known as: COZAAR Take 1 tablet (100 mg total) by mouth daily.   nitroGLYCERIN 0.4 MG SL tablet Commonly known as: NITROSTAT Place 1 tablet (0.4 mg total) under the tongue every 5 (five) minutes x 3 doses as needed for chest pain.   predniSONE 10 MG tablet Commonly known  as: DELTASONE Take 10 mg by mouth 2 (two) times daily.   VITAMIN D PO Take 1 tablet by mouth 2 (two) times a week. Vitamin D           Objective:   Physical Exam BP (!) 140/100 (BP Location: Left Arm, Patient Position: Sitting, Cuff Size: Normal)   Pulse 88   Temp 98.1 F (36.7 C) (Oral)   Resp 20   Ht 5' 0.5" (1.537 m)   Wt 111 lb 6.4 oz (50.5 kg)   SpO2 98%   BMI 21.40 kg/m  General:   Chronically ill appearing but not toxic.  Slightly underweight appearing HEENT:  Normocephalic . Face symmetric, atraumatic Lungs:  CTA B Normal respiratory effort, no intercostal retractions, no accessory muscle use. Heart: RRR,  no murmur.  Lower extremities: no pretibial edema bilaterally  Skin: Not  pale. Not jaundice Neurologic:  alert & oriented X3.  Speech normal, gait appropriate for age, needs assistance transferring. Psych--  Cognition and judgment appear intact.  Cooperative with normal attention span and concentration.  Behavior appropriate. No anxious or depressed appearing.      Assessment    Assessment Prediabetes  HTN : amlodipine: edema 10/2018 Hyperlipidemia, statin intolerant Anxiety, insomnia   Menopausal; DEXA 2012 -1.5 @ gyn Recurrent Syncope --3:1 AV Block pacemaker 02/21/16 -- extensive cards eval before, DX with autonomic insufficiency ~ 02-2015 --on a  abd binder , pyridostigmine, rx midodrine 06-2015 per cards  Traumatic SAH (after a syncope),TBI--- admitted 01-2015  -- was rx seroquel (aparently for ICU delirium) -- d/c 07-2015 --rx topamax when at rehab for post [facility]  HAs -- had issues w/ b/b incontinence, better as of 07-2015 Tremors LUE, onset 9 -2017 Osteopneia: on Boniva, rx elsewhere DOE/CP: Low risk Myoview, echo done 10/20/2019, saw cards 05/2020 and pulmonary 07/2020: multifactorial, PFTx ordered   PLAN  Prediabetes: Check A1c HTN: Currently on losartan, started to take Lasix regularly 3 to 4 days ago.  Recheck a BMP to be sure her potassium is okay.  Not taking any potassium supplements History of AV block, pacemaker: Saw cardiology 01/17/2021.  PPM with intact function.  She was recommended to take Lasix daily as prescribed. BNP and  BMP were satisfactory. Anxiety: Restarted escitalopram 10/03/2020, feeling better, will talk about possibly increase the dose and she agreed.  Rx escitalopram 10 mg. Tremor, expressive aphasia Saw neurology 10/28/2020, was recommended to continue Sinemet for tremor. Was recommended treatment for depression. Expressive aphasia with a stuttering: Was referred to ST Failure to thrive: She seems a slightly more frail today.  still driving on limited basis, still taking care of her husband. I think that in the future she  will need assistance, encouraged her to discuss that with the family. Preventive care: Flu shot today, recommend Covid vaccine. RTC 4 months    This visit occurred during the SARS-CoV-2 public health emergency.  Safety protocols were in place, including screening questions prior to the visit, additional usage of staff PPE, and extensive cleaning of exam room while observing appropriate contact time as indicated for disinfecting solutions.

## 2021-01-23 LAB — BASIC METABOLIC PANEL
BUN: 19 mg/dL (ref 6–23)
CO2: 30 mEq/L (ref 19–32)
Calcium: 9.2 mg/dL (ref 8.4–10.5)
Chloride: 103 mEq/L (ref 96–112)
Creatinine, Ser: 0.78 mg/dL (ref 0.40–1.20)
GFR: 73.82 mL/min (ref >60.00)
Glucose, Bld: 119 mg/dL — ABNORMAL HIGH (ref 70–99)
Potassium: 3.8 mEq/L (ref 3.5–5.1)
Sodium: 140 mEq/L (ref 135–145)

## 2021-01-23 LAB — CBC WITH DIFFERENTIAL/PLATELET
Basophils Absolute: 0 10*3/uL (ref 0.0–0.1)
Basophils Relative: 0.3 % (ref 0.0–3.0)
Eosinophils Absolute: 0 10*3/uL (ref 0.0–0.7)
Eosinophils Relative: 0.2 % (ref 0.0–5.0)
HCT: 38.4 % (ref 36.0–46.0)
Hemoglobin: 12.6 g/dL (ref 12.0–15.0)
Lymphocytes Relative: 7.7 % — ABNORMAL LOW (ref 12.0–46.0)
Lymphs Abs: 0.7 10*3/uL (ref 0.7–4.0)
MCHC: 32.8 g/dL (ref 30.0–36.0)
MCV: 88.2 fl (ref 78.0–100.0)
Monocytes Absolute: 0.3 10*3/uL (ref 0.1–1.0)
Monocytes Relative: 3.5 % (ref 3.0–12.0)
Neutro Abs: 7.8 10*3/uL — ABNORMAL HIGH (ref 1.4–7.7)
Neutrophils Relative %: 88.3 % — ABNORMAL HIGH (ref 43.0–77.0)
Platelets: 254 10*3/uL (ref 150.0–400.0)
RBC: 4.35 Mil/uL (ref 3.87–5.11)
RDW: 14.8 % (ref 11.5–15.5)
WBC: 8.8 10*3/uL (ref 4.0–10.5)

## 2021-01-23 LAB — HEMOGLOBIN A1C: Hgb A1c MFr Bld: 6.6 % — ABNORMAL HIGH (ref 4.6–6.5)

## 2021-01-23 NOTE — Assessment & Plan Note (Signed)
Prediabetes: Check A1c HTN: Currently on losartan, started to take Lasix regularly 3 to 4 days ago.  Recheck a BMP to be sure her potassium is okay.  Not taking any potassium supplements History of AV block, pacemaker: Saw cardiology 01/17/2021.  PPM with intact function.  She was recommended to take Lasix daily as prescribed. BNP and  BMP were satisfactory. Anxiety: Restarted escitalopram 10/03/2020, feeling better, will talk about possibly increase the dose and she agreed.  Rx escitalopram 10 mg. Tremor, expressive aphasia Saw neurology 10/28/2020, was recommended to continue Sinemet for tremor. Was recommended treatment for depression. Expressive aphasia with a stuttering: Was referred to ST Failure to thrive: She seems a slightly more frail today.  still driving on limited basis, still taking care of her husband. I think that in the future she will need assistance, encouraged her to discuss that with the family. Preventive care: Flu shot today, recommend Covid vaccine. RTC 4 months

## 2021-01-24 ENCOUNTER — Other Ambulatory Visit: Payer: Self-pay

## 2021-01-24 ENCOUNTER — Telehealth: Payer: Self-pay | Admitting: *Deleted

## 2021-01-24 ENCOUNTER — Ambulatory Visit: Payer: PPO | Admitting: Podiatry

## 2021-01-24 DIAGNOSIS — L853 Xerosis cutis: Secondary | ICD-10-CM

## 2021-01-24 DIAGNOSIS — M79674 Pain in right toe(s): Secondary | ICD-10-CM

## 2021-01-24 DIAGNOSIS — M79675 Pain in left toe(s): Secondary | ICD-10-CM

## 2021-01-24 DIAGNOSIS — B351 Tinea unguium: Secondary | ICD-10-CM

## 2021-01-24 NOTE — Patient Instructions (Addendum)
Choose a moisturizer from  the list below:   For extremely dry, cracked feet: moisturize feet once daily; do not apply between toes A. CeraVe Healing Ointment B. Eucerin Aquaphor Repairing Ointment (may be labeled Aquaphor Healing Ointment) C. Vaseline Petroleum Healing Jelly   If you have problems reaching your feet: apply to feet once daily; do not apply between toes A.  Eucerin Aquaphor Ointment Body Spray  B.  Vaseline Intensive Care Spray Moisturizer (Unscented,  Cocoa Radiant Spray or Aloe Smooth Spray)

## 2021-01-24 NOTE — Telephone Encounter (Signed)
Lvm  to contact office back about labs results along with making sure taking Furosemide as instructed and Shortness of breath check in .Marland Kitchen

## 2021-01-24 NOTE — Telephone Encounter (Signed)
-----   Message from [facility], [place] sent at 01/23/2021  1:21 PM EST ----- I see you tried, if you would please again.  ALSO, I asked for f/u in 1-2 weeks, I don't see she made an appt please make sure she has follow up.

## 2021-01-28 ENCOUNTER — Encounter: Payer: Self-pay | Admitting: Podiatry

## 2021-01-28 NOTE — Progress Notes (Signed)
Subjective: Mandy Durham is a 75 y.o. female patient seen today for follow up of  painful thick toenails that are difficult to trim. Pain interferes with ambulation. Aggravating factors include wearing enclosed shoe gear. Pain is relieved with periodic professional debridement.  New problems reported today: Patient states she has very dry skin and would like to know how to manage this condition.  PCP is Colon Branch, MD. Last visit was: 01/22/2021.  No Known Allergies  Objective: Physical Exam  General: Patient is a pleasant 75 y.o. Caucasian female WD, WN in NAD. AAO x 3.   Neurovascular Examination: CFT immediate b/l LE. Palpable DP/PT pulses b/l LE. Digital hair present b/l. Skin temperature gradient WNL b/l. No pain with calf compression b/l. No edema noted b/l. No cyanosis or clubbing noted b/l LE.  Protective sensation intact 5/5 intact bilaterally with 10g monofilament b/l. Vibratory sensation intact b/l. Proprioception intact bilaterally.  Dermatological:  No open wounds b/l LE. No interdigital macerations noted b/l LE. Toenails 1-5 b/l elongated, discolored, dystrophic, thickened, crumbly with subungual debris and tenderness to dorsal palpation. No hyperkeratotic nor porokeratotic lesions present on today's visit. Pedal skin noted to be dry b/l lower extremities.  Musculoskeletal:  Normal muscle strength 5/5 to all lower extremity muscle groups bilaterally. HAV with bunion deformity noted b/l LE. Hammertoe deformity noted 2-5 b/l.  Assessment: 1. Pain due to onychomycosis of toenails of both feet   2. Xerosis cutis    Plan: Patient was evaluated and treated and all questions answered. Consent given for treatment as described below: -Examined patient. -No new findings. No new orders. -Patient to continue soft, supportive shoe gear daily. -Mycotic toenails 1-5 bilaterally were debrided in length and girth with sterile nail nippers and dremel without incident. -For dry skin,  patient was given written list of OTC moisturizers. Patient/POA instructed to apply to foot/feet once daily avoiding application between toes.  -Patient/POA to call should there be question/concern in the interim.  Return in about 3 months (around 04/26/2021).  Marzetta Board, DPM

## 2021-01-31 ENCOUNTER — Encounter: Payer: Self-pay | Admitting: Physician Assistant

## 2021-01-31 ENCOUNTER — Other Ambulatory Visit: Payer: Self-pay

## 2021-01-31 ENCOUNTER — Ambulatory Visit: Payer: PPO | Admitting: Physician Assistant

## 2021-01-31 VITALS — BP 120/64 | HR 71 | Resp 18 | Ht 60.0 in | Wt 109.0 lb

## 2021-01-31 DIAGNOSIS — Z950 Presence of cardiac pacemaker: Secondary | ICD-10-CM

## 2021-01-31 DIAGNOSIS — I10 Essential (primary) hypertension: Secondary | ICD-10-CM | POA: Diagnosis not present

## 2021-01-31 DIAGNOSIS — I441 Atrioventricular block, second degree: Secondary | ICD-10-CM | POA: Diagnosis not present

## 2021-01-31 DIAGNOSIS — I5033 Acute on chronic diastolic (congestive) heart failure: Secondary | ICD-10-CM | POA: Diagnosis not present

## 2021-01-31 LAB — CUP PACEART INCLINIC DEVICE CHECK
Battery Remaining Longevity: 62 mo
Battery Voltage: 3 V
Brady Statistic AP VP Percent: 0.07 %
Brady Statistic AP VS Percent: 0.29 %
Brady Statistic AS VP Percent: 10.38 %
Brady Statistic AS VS Percent: 89.27 %
Brady Statistic RA Percent Paced: 0.35 %
Brady Statistic RV Percent Paced: 11.32 %
Date Time Interrogation Session: 20221118190143
Implantable Lead Implant Date: 20171208
Implantable Lead Implant Date: 20171208
Implantable Lead Location: 753859
Implantable Lead Location: 753860
Implantable Lead Model: 5076
Implantable Lead Model: 5076
Implantable Pulse Generator Implant Date: 20171208
Lead Channel Impedance Value: 342 Ohm
Lead Channel Impedance Value: 361 Ohm
Lead Channel Impedance Value: 418 Ohm
Lead Channel Impedance Value: 456 Ohm
Lead Channel Pacing Threshold Amplitude: 0.5 V
Lead Channel Pacing Threshold Amplitude: 0.625 V
Lead Channel Pacing Threshold Pulse Width: 0.4 ms
Lead Channel Pacing Threshold Pulse Width: 0.4 ms
Lead Channel Sensing Intrinsic Amplitude: 19.375 mV
Lead Channel Sensing Intrinsic Amplitude: 2.125 mV
Lead Channel Sensing Intrinsic Amplitude: 2.125 mV
Lead Channel Sensing Intrinsic Amplitude: 21 mV
Lead Channel Setting Pacing Amplitude: 2 V
Lead Channel Setting Pacing Amplitude: 2.5 V
Lead Channel Setting Pacing Pulse Width: 0.4 ms
Lead Channel Setting Sensing Sensitivity: 4 mV

## 2021-01-31 MED ORDER — FUROSEMIDE 20 MG PO TABS: 20.0000 mg | ORAL_TABLET | Freq: Every day | ORAL | 3 refills | Status: DC

## 2021-01-31 NOTE — Patient Instructions (Signed)
Medication Instructions:   ONLY FOR 4 DAYS TAKE FUROSEMIDE (LASIX) 40 MG TWICE A DAY   THEN RESUME FUROSEMIDE (LASIX) 20 MG ONCE A DAY  *If you need a refill on your cardiac medications before your next appointment, please call your pharmacy*   Lab Work: NONE ORDERED  TODAY   If you have labs (blood work) drawn today and your tests are completely normal, you will receive your results only by: MyChart Message (if you have MyChart) OR A paper copy in the mail If you have any lab test that is abnormal or we need to change your treatment, we will call you to review the results.   Testing/Procedures: NONE ORDERED  TODAY   Follow-Up: At [facility], you and your health needs are our priority.  As part of our continuing mission to provide you with exceptional heart care, we have created designated Provider Care Teams.  These Care Teams include your primary Cardiologist (physician) and Advanced Practice Providers (APPs -  Physician Assistants and Nurse Practitioners) who all work together to provide you with the care you need, when you need it.  We recommend signing up for the patient portal called "MyChart".  Sign up information is provided on this After Visit Summary.  MyChart is used to connect with patients for Virtual Visits (Telemedicine).  Patients are able to view lab/test results, encounter notes, upcoming appointments, etc.  Non-urgent messages can be sent to your provider as well.   To learn more about what you can do with MyChart, go to [email or web address].    Your next appointment:   6-8  week(s)  The format for your next appointment:   In Person  Provider:   You may see Will Meredith Leeds, MD or one of the following Advanced Practice Providers on your designated Care Team:   Tommye Standard, PA-C   :1}    Other Instructions

## 2021-01-31 NOTE — Progress Notes (Signed)
Cardiology Office Note Date:  01/31/2021  Patient ID:  Mandy Durham, Mandy Durham [birth year], MRN [id number] PCP:  Colon Branch, MD  Cardiologist: Dr. Aundra Dubin (2017)  Electrophysiologist:  Dr. Curt Bears    Chief Complaint:  planned follow up  History of Present Illness: Mandy Durham is a 75 y.o. female with history of HTN, HLD, LBBB (since around 2000, remote notes report LHC in 2002 showed normal coronaries), traumatic SAH (2016)advanced heart block w/PPM. She has a stuttering speech pattern and a R hand/forearm tremor she state since the time of her head injury.  I saw her 10/06/19, for CP She reports for the last couple months getting SOB easily.  Reports that before she had no difficulties with her ADLs, gives an example of walking to her mail box and back without SOB and now has to catch her breath at the mailbox to walk back, and is SOB once back at the house.  When asked how far a walk it is she reports "not too far" She will get winded with some house chores, not others.  This very unusual for her. When asked about CP, she says no, just very SOB. Discussed if any heaviness, pressure, she says no. She mentions that she told Dr. Curt Bears last time here and he stopped one of her medicines but that did not help.  When I tell her that was end of March she is surprised, states she did not think she had been feeling poorly that long. She is certain and firm that she is not having CP, but the SOB does make her anxious No rest SOB, no dizzy spells, near syncope or syncope  She denies any swelling ,bolating, no symptoms of orthopnea or PND. Noted VP only 2%, planned for an echo and stress test to evaluate her progressive DOE Stress test was low risk with no ischemia or prior infarct Echo looked OK, LVEF 50-55%, Anteroseptal and inferoseptal hypokinesis.  Left ventricular diastolic parameters are consistent with Grade I diastolic dysfunction (impaired relaxation). Elevated left ventricular end-diastolic  pressure. RV OK, no significant VHD She was started on lasix and then uptitrated and planned for follow up f/u BMET was stable  F/u 12/04/19 She feels OK, thinks the lasix has helped, still has some days that she feels a little winded with activities, but all in all better. No CP, palpitations No rest SOB, no symptoms of PND or orthopnea, though wakes a few times to urinate. Since our last visit she has hd once or twice both while seated a very fleeting/sudden dizzy spell that she though she was blacking out.  Over a fast as it started. No changes, labs done, planned to monitor for recurrent symptoms  She has seen Dr. Curt Bears subsequently doing well, no changes made at his visit  More recently saw A. Albertina [place], [place], March 2022 he discussed worsening SOB over the year despiet  and referred to pulmonology given unremarkable cardiac work up.   I saw her 01/17/21 She feels pretty lousy, says her SOB is significant and she can hardly take care of the day to day activities. That being said, she reports that she has felt this way at least 40mo, since she saw Jonni Sanger, and has been SOB for more then a year. SHe is clear that how she feels today is how she felt in March, not particularly escalating, but none better. She saw pulmonology and reports that she was supposed to be getting a test done, but has not yet She  has had some CP this is random, Infrequent sharp and momentary No near syncope or syncope. She denies symptoms of orthopnea, sometimes she feels SOB seated and standing up seems to help, though her SOB is always the worst when exerting herself, settles after a few minutes No palpitations  Felt to be volume OL,, turned out she was not taking lasix it seemed since originally prescribed, thinking was to use when she was having difficulty urinating She reported her current state of symptoms as ongoing for a year, had not had any acute/recent progression She was advised to start the lasix taking it  daily , updated labs Discussed ER precautions with her  She saw her PMD 01/22/21, discussed her anxiety better with Lexapro, and failure to thrive Described as slightly more frail, was caring for her huband No mention or noted c/o SOB  TODAY She is a little less SOB but not resolved or to her baseline No CP, no palpitations No near syncope or syncope   Device information MDT dual chamber PPM implanted 02/21/2016  Past Medical History:  Diagnosis Date   Anxiety state, unspecified    Hyperlipidemia    Hypertension    Insomnia    LBBB (left bundle branch block)    LHC in 2002 showed normal coronaries.    Osteoarthrosis, unspecified whether generalized or localized, unspecified site    Overweight(278.02)    S/P placement of cardiac pacemaker    a. 02/21/16: Medtronic Advisa DR MRI SureScan model F7PZ02 (serial number [id number])    Serrated adenoma of colon 2007   Symptomatic menopausal or female climacteric states    Syncope and collapse    11/12: Holter 12/12 with rare PACS, HR range 64-140, average 82, no significant arrhythmias. Echo (1/13): EF 50-55%, mild LVH, septal-lateral dyssynchrony c/w LBBB. 3-week event monitor (1/13): No significant arrhythmia.     Past Surgical History:  Procedure Laterality Date   COLONOSCOPY  2007   EP IMPLANTABLE DEVICE N/A 02/21/2016   Procedure: Pacemaker Implant;  Surgeon: Will Meredith Leeds, MD;  Location: [facility];  Service: Cardiovascular;  Laterality: N/A;   POLYPECTOMY      Current Outpatient Medications  Medication Sig Dispense Refill   acetaminophen (TYLENOL) 500 MG tablet Take 500 mg by mouth every 6 (six) hours as needed.     aspirin EC 81 MG tablet Take 81 mg by mouth daily. Swallow whole.     carbidopa-levodopa (SINEMET IR) 25-100 MG tablet Take 1 tablet by mouth 3 (three) times daily. 270 tablet 3   Cholecalciferol (VITAMIN D PO) Take 1 tablet by mouth 2 (two) times a week. Vitamin D     escitalopram (LEXAPRO) 10  MG tablet Take 1 tablet (10 mg total) by mouth daily. 30 tablet 6   ezetimibe (ZETIA) 10 MG tablet Take 1 tablet (10 mg total) by mouth daily. 90 tablet 1   furosemide (LASIX) 20 MG tablet Take 2 tablets (40 mg total) by mouth daily. 90 tablet 3   LINZESS 145 MCG CAPS capsule Take 145 mcg by mouth every morning.     losartan (COZAAR) 100 MG tablet Take 1 tablet (100 mg total) by mouth daily. 30 tablet 3   nitroGLYCERIN (NITROSTAT) 0.4 MG SL tablet Place 1 tablet (0.4 mg total) under the tongue every 5 (five) minutes x 3 doses as needed for chest pain. 25 tablet 1   predniSONE (DELTASONE) 10 MG tablet Take 10 mg by mouth 2 (two) times daily.  No current facility-administered medications for this visit.    Allergies:   Patient has no known allergies.   Social History:  The patient  reports that she has never smoked. She has never used smokeless tobacco. She reports that she does not drink alcohol and does not use drugs.   Family History:  The patient's family history includes Coronary artery disease in her mother; Dementia in her mother; Diabetes in her mother; Heart failure in her mother.  ROS:  Please see the history of present illness.  All other systems are reviewed and otherwise negative.   PHYSICAL EXAM:  VS:  There were no vitals taken for this visit. BMI: There is no height or weight on file to calculate BMI. Well nourished, well developed, in no acute distress, appears frail, but more sturdy today HEENT: normocephalic, atraumatic  Neck: no JVD, carotid bruits or masses Cardiac:  RRR; tachycardic, soft SM, no rubs, or gallops Lungs:   CTA b/l, no wheezing, rhonchi or rales  Abd: soft, nontender MS: no deformity, thin body habitus, age appropriate atrophy Ext:  trace-1+ edema  Skin: warm and dry, no rash Neuro:  She has a stuttered speech pattern and a resting R hand/forearm tremor, leans to the left today though not as much, she says this has been for the last 6 months or so  too, c/w  neurology, otherwise no gross deficits appreciated Psych: euthymic mood, full affect  PPM site is stable, no tethering or discomfort   EKG:  not done today   PPM interrogation done today and reviewed by myself: Battery and lead measurements are good No arrhythmias  10/20/2019: TTE IMPRESSIONS  1. Anteroseptal and inferoseptal hypokinesis. Left ventricular ejection  fraction, by estimation, is 50 to 55%. The left ventricle has low normal  function. The left ventricle demonstrates regional wall motion  abnormalities (see scoring diagram/findings  for description). Left ventricular diastolic parameters are consistent  with Grade I diastolic dysfunction (impaired relaxation). Elevated left  ventricular end-diastolic pressure.   2. Right ventricular systolic function is normal. The right ventricular  size is normal. There is normal pulmonary artery systolic pressure.   3. Left atrial size was mildly dilated.   4. The mitral valve is normal in structure. Trivial mitral valve  regurgitation. No evidence of mitral stenosis.   5. Tricuspid valve regurgitation is mild to moderate.   6. The aortic valve is tricuspid. Aortic valve regurgitation is not  visualized. No aortic stenosis is present.   7. The inferior vena cava is normal in size with greater than 50%  respiratory variability, suggesting right atrial pressure of 3 mmHg.     10/20/2019; lexiscan stress myoview Nuclear stress EF: 50%. The left ventricular ejection fraction is mildly decreased (45-54%). Visually, the EF appears greater than the 50% computer generated EF There was no ST segment deviation noted during stress. This is a low risk study. There is no evidence of ischemia and no evidence of previous infarction. The study is normal.    TTE 07/17/15  Review of the above records today demonstrates:  - Left ventricle: The cavity size was normal. There was severe   focal basal hypertrophy of the septum. Systolic  function was   normal. The estimated ejection fraction was in the range of 50%   to 55%. Anteroseptal hypokinesis. Doppler parameters are   consistent with abnormal left ventricular relaxation (grade 1   diastolic dysfunction). The E/e&' ratio is between 8-15,   suggesting indeterminate LV filling pressure. -  Mitral valve: Mildly thickened leaflets . There was trivial   regurgitation. - Left atrium: The atrium was normal in size. - Tricuspid valve: There was mild regurgitation. - Pulmonary arteries: PA peak pressure: 32 mm Hg (S). - Inferior vena cava: The vessel was normal in size. The   respirophasic diameter changes were in the normal range (>= 50%),   consistent with normal central venous pressure.  Recent Labs: 06/07/2020: ALT 16; TSH 1.31 01/17/2021: NT-Pro BNP 724 01/22/2021: BUN 19; Creatinine, Ser 0.78; Hemoglobin 12.6; Platelets 254.0; Potassium 3.8; Sodium 140  06/07/2020: Cholesterol 234; HDL 119; LDL Cholesterol (Calc) 100; Total CHOL/HDL Ratio 2.0; Triglycerides 66   Estimated Creatinine Clearance: 44.1 mL/min (by C-G formula based on SCr of 0.78 mg/dL).   Wt Readings from Last 3 Encounters:  01/22/21 111 lb 6.4 oz (50.5 kg)  01/17/21 109 lb 12.8 oz (49.8 kg)  10/28/20 117 lb 6.4 oz (53.3 kg)     Other studies reviewed: Additional studies/records reviewed today include: summarized above  ASSESSMENT AND PLAN:  1. PPM     Intact function     no programming changes amde  2. HTN     Looks OK  3. Diastolic dysfunction 4. Acute/chronic CHF    She looks better today, less eaxhausted looking, more animated    She is not visibly SOB today    She reports an uptick in urine output with lasix    Given not to baseline, though perhaps multifactoria, I have asked her to pulse her lasix to 40mg  BID x4 days then back to daily dosing  If she has continued improvement will have her back in 2 mo or so, she will follow up with pulmonary as well     She will come in sooner if  needed   Disposition: as above     Current medicines are reviewed at length with the patient today.  The patient did not have any concerns regarding medicines.  Venetia Night, PA-C 01/31/2021 5:40 AM     [facility] [address] [place] [address] [phone] (office)  [phone] (fax)

## 2021-02-07 ENCOUNTER — Other Ambulatory Visit: Payer: Self-pay | Admitting: Adult Health

## 2021-02-11 ENCOUNTER — Telehealth: Payer: Self-pay | Admitting: Adult Health

## 2021-02-11 DIAGNOSIS — M40204 Unspecified kyphosis, thoracic region: Secondary | ICD-10-CM | POA: Diagnosis not present

## 2021-02-11 DIAGNOSIS — G20 Parkinson's disease: Secondary | ICD-10-CM | POA: Diagnosis not present

## 2021-02-11 DIAGNOSIS — G959 Disease of spinal cord, unspecified: Secondary | ICD-10-CM | POA: Diagnosis not present

## 2021-02-11 NOTE — Telephone Encounter (Signed)
I called and spoke to husband then pt.  She is having worsening tremors Bil UE.  (Progressively worsening for the last 3-4 months.  I offered earlier appt for eval 02-13-2021 at 0900, arrive 0845.  She is on CL 25/100 1 po tid (w/meals).  No other new medications. She states that taking the CL notes some difference but not a lot.  Has 6 mo appt 04-2021, I did not cancel at this time.

## 2021-02-11 NOTE — Telephone Encounter (Signed)
Pt's daughter, William Hamburger (on last [facility] 2017) called, she may need a increase dosage of carbidopa-levodopa (SINEMET IR) 25-100 MG tablet. Tremors are getting worse. Would like a call from the nurse. Contact husband: at [phone]

## 2021-02-12 ENCOUNTER — Other Ambulatory Visit: Payer: Self-pay | Admitting: Neurosurgery

## 2021-02-12 DIAGNOSIS — G959 Disease of spinal cord, unspecified: Secondary | ICD-10-CM

## 2021-02-13 ENCOUNTER — Ambulatory Visit: Payer: PPO | Admitting: Adult Health

## 2021-02-13 ENCOUNTER — Other Ambulatory Visit: Payer: Self-pay

## 2021-02-13 VITALS — BP 165/83 | HR 87 | Ht 65.0 in

## 2021-02-13 DIAGNOSIS — G20 Parkinson's disease: Secondary | ICD-10-CM

## 2021-02-13 DIAGNOSIS — R251 Tremor, unspecified: Secondary | ICD-10-CM | POA: Diagnosis not present

## 2021-02-13 MED ORDER — CARBIDOPA-LEVODOPA 25-100 MG PO TABS: 1.0000 | ORAL_TABLET | Freq: Four times a day (QID) | ORAL | 3 refills | Status: DC

## 2021-02-13 NOTE — Progress Notes (Signed)
PATIENT: Mandy Durham DOB: [birth year]  REASON FOR VISIT: follow up HISTORY FROM: patient HISTORY OF PRESENT ILLNESS: Today 02/13/21:  [name] is a 75 year old female with a history of tremor.  She returns today for follow-up.  In reviewing Dr. Edwena Felty original note it was questionable whether the patient had essential tremor versus vascular parkinsonism?  Patient returns today reporting that tremor is worse.  While sitting in the exam room she has a significant tremor at rest in the left hand and both legs.  The patient states that her legs feel weak.  She does use a walker at home.  Denies any recent falls.  Denies any trouble chewing or swallowing food.  She has not taken her Sinemet this morning.  She is not sure how much the tremor improves after taking Sinemet as she reports she has never paid attention to this.  She returns today for an evaluation.  10/28/20: Mandy Durham is a 75 year old female with a history of tremor.  She returns today for follow-up.  She feels that her tremor is worse.  She states that it occurs mainly with activity.  Primarily in the hands.  She is able to complete all ADLs independently.  She remains on Sinemet 25-100 mg 3 times a day.  Denies any significant changes with her walking.  Does report that she has some back pain and that changes her gait slightly.  She states that her speech is worse.  She has noticed more stuttering.  The patient also has several other complaints during today's visit.  States that she has been depressed.  Her PCP started her on 2 medications but she is no longer on those.  Also notices a decline in her memory, lack of energy and feeling tired throughout the day.  She returns today for an evaluation.  10/26/19: Mandy Durham is a 75 year old female with a history of tremor.  She returns today for follow-up.  She feels that her tremor has remained relatively stable.  She states that she has been under more stress recently and does feel  exhausted.  She continues on Sinemet 3 times a day.  Denies any significant changes with her gait.  Denies any falls.  She continues to have trouble with expressive aphasia.  She returns today for follow-up.  HISTORY (Copied from Dr.Dohmeier's note) Mandy Durham had originally asked for this appointment due to incontinence following a SAH. She had fainted, fell and aquiered the TBI with SAH. She states that the incontinence  is improved. She still has some urine leakage and soft stools. She wonders if this is still a neurologic impairment from the [facility]. She  has no pain with bowel movements, currently no headaches or nausea. She remains on Mestinon to protect her from fainting spells.  Patient was last seen in April 2019 by Venancio Poisson, NP.  She had tried carbidopa - Ldopa, and it had initially helped her tremors, which I believed to be essential tremor.  She now reports that the tremor is no longer controlled. She is on bid Sinemet 25/ 100 mg.      REVIEW OF SYSTEMS: Out of a complete 14 system review of symptoms, the patient complains only of the following symptoms, and all other reviewed systems are negative.  See HPI  ALLERGIES: No Known Allergies  HOME MEDICATIONS: Outpatient Medications Prior to Visit  Medication Sig Dispense Refill   acetaminophen (TYLENOL) 500 MG tablet Take 500 mg by mouth every 6 (six) hours as needed.  aspirin EC 81 MG tablet Take 81 mg by mouth daily. Swallow whole.     carbidopa-levodopa (SINEMET IR) 25-100 MG tablet TAKE 1 TABLET BY MOUTH THREE TIMES DAILY 270 tablet 0   Cholecalciferol (VITAMIN D PO) Take 1 tablet by mouth 2 (two) times a week. Vitamin D     escitalopram (LEXAPRO) 10 MG tablet Take 1 tablet (10 mg total) by mouth daily. 30 tablet 6   ezetimibe (ZETIA) 10 MG tablet Take 1 tablet (10 mg total) by mouth daily. 90 tablet 1   furosemide (LASIX) 20 MG tablet Take 1 tablet (20 mg total) by mouth daily. 90 tablet 3   LINZESS 145 MCG CAPS  capsule Take 145 mcg by mouth every morning.     losartan (COZAAR) 100 MG tablet Take 1 tablet (100 mg total) by mouth daily. 30 tablet 3   nitroGLYCERIN (NITROSTAT) 0.4 MG SL tablet Place 1 tablet (0.4 mg total) under the tongue every 5 (five) minutes x 3 doses as needed for chest pain. 25 tablet 1   predniSONE (DELTASONE) 10 MG tablet Take 10 mg by mouth 2 (two) times daily. (Patient not taking: Reported on 02/13/2021)     No facility-administered medications prior to visit.    PAST MEDICAL HISTORY: Past Medical History:  Diagnosis Date   Anxiety state, unspecified    Hyperlipidemia    Hypertension    Insomnia    LBBB (left bundle branch block)    LHC in 2002 showed normal coronaries.    Osteoarthrosis, unspecified whether generalized or localized, unspecified site    Overweight(278.02)    S/P placement of cardiac pacemaker    a. 02/21/16: Medtronic Advisa DR MRI SureScan model B7SE83 (serial number [id number])    Serrated adenoma of colon 2007   Symptomatic menopausal or female climacteric states    Syncope and collapse    11/12: Holter 12/12 with rare PACS, HR range 64-140, average 82, no significant arrhythmias. Echo (1/13): EF 50-55%, mild LVH, septal-lateral dyssynchrony c/w LBBB. 3-week event monitor (1/13): No significant arrhythmia.     PAST SURGICAL HISTORY: Past Surgical History:  Procedure Laterality Date   COLONOSCOPY  2007   EP IMPLANTABLE DEVICE N/A 02/21/2016   Procedure: Pacemaker Implant;  Surgeon: Will Meredith Leeds, MD;  Location: [facility];  Service: Cardiovascular;  Laterality: N/A;   POLYPECTOMY      FAMILY HISTORY: Family History  Problem Relation Age of Onset   Heart failure Mother    Coronary artery disease Mother    Dementia Mother    Diabetes Mother    Colon cancer Neg Hx    Breast cancer Neg Hx    Hypertension Neg Hx    Heart disease Neg Hx    Heart attack Neg Hx    Stroke Neg Hx    Rectal cancer Neg Hx    Stomach cancer Neg Hx     Esophageal cancer Neg Hx    Liver disease Neg Hx     SOCIAL HISTORY: Social History   Socioeconomic History   Marital status: Married    Spouse name: Eddie   Number of children: 2   Years of education: Not on file   Highest education level: Not on file  Occupational History   Occupation: retired Environmental consultant: [employer]  Tobacco Use   Smoking status: Never   Smokeless tobacco: Never  Vaping Use   Vaping Use: Never used  Substance and Sexual Activity  Alcohol use: No   Drug use: No   Sexual activity: Not on file  Other Topics Concern   Not on file  Social History Narrative   HSG.  Married '65.  2 daughters, '71, '77; 4 grandchildren         Social Determinants of Radio broadcast assistant Strain: Not on file  Food Insecurity: Not on file  Transportation Needs: Not on file  Physical Activity: Not on file  Stress: Not on file  Social Connections: Not on file  Intimate Partner Violence: Not on file      PHYSICAL EXAM  Vitals:   02/13/21 0912  BP: (!) 165/83  Pulse: 87  Height: 5\' 5"  (1.651 m)   Body mass index is 18.14 kg/m.  Generalized: Well developed, in no acute distress   Neurological examination  Mentation: Alert oriented to time, place, history taking. Expressive aphasia noted with stuttering  Cranial nerve II-XII: Pupils were equal round reactive to light. Extraocular movements were full, visual field were full on confrontational test. Facial sensation and strength were normal. Uvula tongue midline. Head turning and shoulder shrug  were normal and symmetric. Motor: The motor testing reveals 5 over 5 strength of all 4 extremities. Good symmetric motor tone is noted throughout.   Sensory: Sensory testing is intact to soft touch on all 4 extremities. No evidence of extinction is noted.  Coordination: Cerebellar testing reveals good finger-nose-finger and heel-to-shin bilaterally.  Resting tremor noted in the  upper extremities left greater than right.  Mild tremor noted with finger-nose-finger bilaterally Gait and station: Patient requires assistance with standing, stooped posture, good stride multiple steps with turns.  Decreased arm swing with tremor noted in the left hand. Reflexes: Deep tendon reflexes are symmetric and normal bilaterally.   DIAGNOSTIC DATA (LABS, IMAGING, TESTING) - I reviewed patient records, labs, notes, testing and imaging myself where available.  Lab Results  Component Value Date   WBC 8.8 01/22/2021   HGB 12.6 01/22/2021   HCT 38.4 01/22/2021   MCV 88.2 01/22/2021   PLT 254.0 01/22/2021      Component Value Date/Time   NA 140 01/22/2021 1625   NA 140 01/17/2021 1623   K 3.8 01/22/2021 1625   CL 103 01/22/2021 1625   CO2 30 01/22/2021 1625   GLUCOSE 119 (H) 01/22/2021 1625   BUN 19 01/22/2021 1625   BUN 18 01/17/2021 1623   CREATININE 0.78 01/22/2021 1625   CREATININE 0.85 06/07/2020 1030   CALCIUM 9.2 01/22/2021 1625   PROT 6.6 06/07/2020 1030   PROT 6.7 10/28/2015 1350   ALBUMIN 4.2 10/04/2019 1518   ALBUMIN 3.9 10/28/2015 1350   AST 18 06/07/2020 1030   ALT 16 06/07/2020 1030   ALKPHOS 68 10/04/2019 1518   BILITOT 0.5 06/07/2020 1030   BILITOT 0.3 10/28/2015 1350   GFRNONAA 81 12/04/2019 1540   GFRAA 93 12/04/2019 1540   Lab Results  Component Value Date   CHOL 234 (H) 06/07/2020   HDL 119 06/07/2020   LDLCALC 100 (H) 06/07/2020   LDLDIRECT 106.9 11/03/2012   TRIG 66 06/07/2020   CHOLHDL 2.0 06/07/2020   Lab Results  Component Value Date   HGBA1C 6.6 (H) 01/22/2021   Lab Results  Component Value Date   VITAMINB12 390 10/04/2019   Lab Results  Component Value Date   TSH 1.31 06/07/2020      ASSESSMENT AND PLAN 75 y.o. year old female  has a past medical history of Anxiety state, unspecified,  Hyperlipidemia, Hypertension, Insomnia, LBBB (left bundle branch block), Osteoarthrosis, unspecified whether generalized or localized,  unspecified site, Overweight(278.02), S/P placement of cardiac pacemaker, Serrated adenoma of colon (2007), Symptomatic menopausal or female climacteric states, and Syncope and collapse. here with :  vascular parkinsonism  Gait abnormality  Patient has more pronounced symptoms consistent with parkinsonism at this visit.  We will increase  Sinemet 25-100 mg 4 times a day spaced out by 4 hours. Referral placed for home health for physical therapy for gait balance and strengthening training. Advised patient that if her symptoms do not improve she should let us know She will follow-up in 2 to 3 months with Dr. Mechele Claude, MSN, NP-C 02/13/2021, 9:17 AM [facility] [address], [place], [place] [address] [phone]

## 2021-02-13 NOTE — Patient Instructions (Signed)
Your Plan:  Increase Sinemet to 4 times a day. Space out by 4 hours Referral for PT If your symptoms worsen or you develop new symptoms please let us know.   Thank you for coming to see [place] at [facility]. I hope we have been able to provide you high quality care today.  You may receive a patient satisfaction survey over the next few weeks. We would appreciate your feedback and comments so that we may continue to improve ourselves and the health of our patients.

## 2021-02-18 ENCOUNTER — Telehealth: Payer: Self-pay | Admitting: Adult Health

## 2021-02-18 NOTE — Telephone Encounter (Signed)
Pt called states she had some more questions relating to her appt she had last week. Pt requesting a call back.

## 2021-02-18 NOTE — Telephone Encounter (Signed)
LMVM for pt to return call.   

## 2021-02-19 NOTE — Telephone Encounter (Signed)
Patient called in and requested info regarding home health services coming out to help her. I advised per Lovena Le referral for home health can take up to a month as we have to wait for acceptance from a home health agency. Advised we would call her back as soon as we have an update for her. Patient expressed appreciation and stated she would wait to hear from [place], advised if she does not hear from [place] within that time frame to call back.

## 2021-02-19 NOTE — Telephone Encounter (Signed)
Pt returned call and spoke with NP referrals, states pt asking when she will hear from home health. Messaged relayed to pt via referrals that referral to home health is pending, recommend giving it 2 weeks.

## 2021-02-20 ENCOUNTER — Ambulatory Visit (INDEPENDENT_AMBULATORY_CARE_PROVIDER_SITE_OTHER): Payer: PPO

## 2021-02-20 ENCOUNTER — Ambulatory Visit: Payer: PPO | Admitting: Pulmonary Disease

## 2021-02-20 ENCOUNTER — Encounter: Payer: Self-pay | Admitting: Pulmonary Disease

## 2021-02-20 ENCOUNTER — Telehealth: Payer: Self-pay | Admitting: Adult Health

## 2021-02-20 ENCOUNTER — Other Ambulatory Visit: Payer: Self-pay

## 2021-02-20 VITALS — BP 124/68 | HR 82 | Temp 97.0°F | Ht 65.0 in | Wt 107.2 lb

## 2021-02-20 DIAGNOSIS — R0609 Other forms of dyspnea: Secondary | ICD-10-CM

## 2021-02-20 DIAGNOSIS — J449 Chronic obstructive pulmonary disease, unspecified: Secondary | ICD-10-CM | POA: Diagnosis not present

## 2021-02-20 DIAGNOSIS — M542 Cervicalgia: Secondary | ICD-10-CM

## 2021-02-20 DIAGNOSIS — G20 Parkinson's disease: Secondary | ICD-10-CM

## 2021-02-20 DIAGNOSIS — R0600 Dyspnea, unspecified: Secondary | ICD-10-CM | POA: Diagnosis not present

## 2021-02-20 MED ORDER — BUDESONIDE-FORMOTEROL FUMARATE 160-4.5 MCG/ACT IN AERO: 2.0000 | INHALATION_SPRAY | Freq: Two times a day (BID) | RESPIRATORY_TRACT | 2 refills | Status: DC

## 2021-02-20 NOTE — Progress Notes (Signed)
@Patient  ID: Mandy Durham, female    DOB: 07-Oct-1945, 75 y.o.   MRN: [id number]  Chief Complaint  Patient presents with   Follow-up    Pt states she is getting more SOB recently. Pt states her SOB is worse when she is walking     Referring provider: Colon Branch, MD  HPI:   75 y.o. whom we are seeing in follow up for evaluation of dyspnea on exertion.  Cardiology note x2 reviewed.  Patient with ongoing dyspnea.  Seems may be a bit worse over the last few months.  Hard to tell.  She was lost to follow-up and has not seen me in many months.  Recent cardiology note reviewed.  Dyspnea is worsening.  She was not taking Lasix as prescribed.  This was resumed.  Dyspnea seems improved a little bit.  Today she endorses worsening neck pain.  Stooped over, leaning to the left.  Unsteady gait.  Appears more kyphotic than prior.  Discussed this likely could be contributing to some extrathoracic restriction causing shortness of breath.  She endorses some new description of runny nose, nasal congestion, allergy symptoms.  PFTs ordered last visit.  These were not completed.  HPI at initial visit: Patient has worsening dyspnea exertion over the last year to 18 months.  Worse with inclines or stairs.  Present over longer distances on flat surfaces.  No timing during the day when things are better or worse.  No positional changes that make things better or worse.  No seasonal or environmental changes that make things better or worse.  Resting improves.  No other relieving or exacerbating factors.  Reviewed chest imaging recent chest x-ray 10/06/2019 which on my interpretation reveals clear lungs.  CT chest coronary with visible portion lungs clear on my interpretation.  Had TTE 10/2019 that showed dilated LA, diastolic dysfunction, elevated LVEDP.  PMH: Hypertension, tremor*TBI Surgical history: Colonoscopy, pacemaker implant Family history: Mother with CHF, CAD, diabetes Social history: Never smoker, lives in  [place] / Pulmonary Flowsheets:   ACT:  No flowsheet data found.  MMRC: mMRC Dyspnea Scale mMRC Score  07/19/2020 0    Epworth:  No flowsheet data found.  Tests:   FENO:  No results found for: NITRICOXIDE  PFT: No flowsheet data found.  WALK:  No flowsheet data found.  Imaging: Personally reviewed and as per EMR discussion this note [facility] [place]  Result Date: 01/31/2021 Pacemaker check in clinic. Normal device function. Thresholds, sensing, impedances consistent with previous measurements. Device programmed to maximize longevity. No mode switch or high ventricular rates noted. Device programmed at appropriate safety margins. Histogram distribution appropriate for patient activity level. Device programmed to optimize intrinsic conduction. Estimated longevity _5 years__. Patient enrolled in remote follow-up. Patient education completed.    Lab Results: Personally reviewed and as per EMR, no anemia CBC    Component Value Date/Time   WBC 8.8 01/22/2021 1625   RBC 4.35 01/22/2021 1625   HGB 12.6 01/22/2021 1625   HGB 14.2 10/28/2015 1350   HCT 38.4 01/22/2021 1625   HCT 44.1 10/28/2015 1350   PLT 254.0 01/22/2021 1625   PLT 337 10/28/2015 1350   MCV 88.2 01/22/2021 1625   MCV 86 10/28/2015 1350   MCH 28.8 06/07/2020 1030   MCHC 32.8 01/22/2021 1625   RDW 14.8 01/22/2021 1625   RDW 14.2 10/28/2015 1350   LYMPHSABS 0.7 01/22/2021 1625   MONOABS 0.3 01/22/2021 1625   EOSABS 0.0 01/22/2021  1625   BASOSABS 0.0 01/22/2021 1625    BMET    Component Value Date/Time   NA 140 01/22/2021 1625   NA 140 01/17/2021 1623   K 3.8 01/22/2021 1625   CL 103 01/22/2021 1625   CO2 30 01/22/2021 1625   GLUCOSE 119 (H) 01/22/2021 1625   BUN 19 01/22/2021 1625   BUN 18 01/17/2021 1623   CREATININE 0.78 01/22/2021 1625   CREATININE 0.85 06/07/2020 1030   CALCIUM 9.2 01/22/2021 1625   GFRNONAA 81 12/04/2019 1540   GFRAA 93 12/04/2019  1540    BNP No results found for: BNP  ProBNP    Component Value Date/Time   PROBNP 724 01/17/2021 1623    Specialty Problems       Pulmonary Problems   Allergic rhinitis    No Known Allergies  Immunization History  Administered Date(s) Administered   Fluad Quad(high Dose 65+) 12/14/2018, 03/06/2020, 01/22/2021   Influenza Split 12/10/2011   Influenza Whole 01/17/2007, 12/18/2008   Influenza, High Dose Seasonal PF 12/19/2014, 01/13/2016, 12/31/2017, 01/20/2018   Influenza,inj,Quad PF,6+ Mos 12/05/2012, 11/14/2013   Moderna Sars-Covid-2 Vaccination 04/20/2019, 05/16/2019, 02/09/2020   Pneumococcal Conjugate-13 09/07/2014   Pneumococcal Polysaccharide-23 11/02/2012   Td 06/17/2004   Tdap 02/12/2011    Past Medical History:  Diagnosis Date   Anxiety state, unspecified    Hyperlipidemia    Hypertension    Insomnia    LBBB (left bundle branch block)    LHC in 2002 showed normal coronaries.    Osteoarthrosis, unspecified whether generalized or localized, unspecified site    Overweight(278.02)    S/P placement of cardiac pacemaker    a. 02/21/16: Medtronic Advisa DR MRI SureScan model T4SF68 (serial number [id number])    Serrated adenoma of colon 2007   Symptomatic menopausal or female climacteric states    Syncope and collapse    11/12: Holter 12/12 with rare PACS, HR range 64-140, average 82, no significant arrhythmias. Echo (1/13): EF 50-55%, mild LVH, septal-lateral dyssynchrony c/w LBBB. 3-week event monitor (1/13): No significant arrhythmia.     Tobacco History: Social History   Tobacco Use  Smoking Status Never  Smokeless Tobacco Never   Counseling given: Not Answered   Continue to not smoke  Outpatient Encounter Medications as of 02/20/2021  Medication Sig   acetaminophen (TYLENOL) 500 MG tablet Take 500 mg by mouth every 6 (six) hours as needed.   aspirin EC 81 MG tablet Take 81 mg by mouth daily. Swallow whole.   budesonide-formoterol  (SYMBICORT) 160-4.5 MCG/ACT inhaler Inhale 2 puffs into the lungs 2 (two) times daily.   carbidopa-levodopa (SINEMET IR) 25-100 MG tablet Take 1 tablet by mouth 4 (four) times daily.   Cholecalciferol (VITAMIN D PO) Take 1 tablet by mouth 2 (two) times a week. Vitamin D   escitalopram (LEXAPRO) 10 MG tablet Take 1 tablet (10 mg total) by mouth daily.   ezetimibe (ZETIA) 10 MG tablet Take 1 tablet (10 mg total) by mouth daily.   furosemide (LASIX) 20 MG tablet Take 1 tablet (20 mg total) by mouth daily.   losartan (COZAAR) 100 MG tablet Take 1 tablet (100 mg total) by mouth daily.   nitroGLYCERIN (NITROSTAT) 0.4 MG SL tablet Place 1 tablet (0.4 mg total) under the tongue every 5 (five) minutes x 3 doses as needed for chest pain.   [DISCONTINUED] LINZESS 145 MCG CAPS capsule Take 145 mcg by mouth every morning.   [DISCONTINUED] predniSONE (DELTASONE) 10 MG tablet Take 10 mg  by mouth 2 (two) times daily.   No facility-administered encounter medications on file as of 02/20/2021.     Review of Systems  Review of Systems  N/a  Physical Exam  BP 124/68 (BP Location: Left Arm, Patient Position: Sitting, Cuff Size: Normal)   Pulse 82   Temp (!) 97 F (36.1 C) (Oral)   Ht 5\' 5"  (1.651 m)   Wt 107 lb 3.2 oz (48.6 kg)   SpO2 96%   BMI 17.84 kg/m   Wt Readings from Last 5 Encounters:  02/20/21 107 lb 3.2 oz (48.6 kg)  01/31/21 109 lb (49.4 kg)  01/22/21 111 lb 6.4 oz (50.5 kg)  01/17/21 109 lb 12.8 oz (49.8 kg)  10/28/20 117 lb 6.4 oz (53.3 kg)    BMI Readings from Last 5 Encounters:  02/20/21 17.84 kg/m  02/13/21 18.14 kg/m  01/31/21 21.29 kg/m  01/22/21 21.40 kg/m  01/17/21 21.09 kg/m     Physical Exam General: Sitting up in chair, in no distress Eyes: EOMI, icterus Neck: Supple, no JVP appreciated Cardiovascular: Irregular, warm Pulmonary: Clear to auscultation bilaterally, no wheezes Abdomen: Nondistended, bowel sounds present MSK: No synovitis, joint  effusion Neuro: Normal gait, no weakness, near constant right lower extremity tremor and occasional upper extremity, hand tremor particular with walking  Psych: Normal mood, full affect   Assessment & Plan:   Dyspnea on exertion: Suspect is multifactorial related somewhat to deconditioning as she has been less active over the course of the pandemic.  Do suspect an element of cardiac component given her diastolic dysfunction left atrial dilation as well as confirmed elevated LVEDP on TTE 10/2019.  Suspect worsening diastology with exercise contributing to symptoms.  Prior chest imaging clear.  She is never smoker.  Suspect kyphosis leading to extra parenchymal restriction and shortness of breath.  Some atopic symptoms endorsed today.  Trial ICS/LABA although not optimistic will be very helpful.  Chest x-ray ordered today since last chest imaging in 2021.  Neck pain: Neck leans to the left.  Chronic.  Referral to physical therapy.   Return in about 6 weeks (around 04/03/2021).   Lanier Clam, MD 02/20/2021

## 2021-02-20 NOTE — Patient Instructions (Addendum)
Nice to see you  I am sorry about your neck, I worry some of the worsening shortness of breath is related to the neck pain and you being hunched over causing difficulty for your lungs to expand and open up.  We will get a chest x-ray today for further evaluation  I sent an inhaler, Symbicort 2 puffs twice a day.  Try this.  Sometimes allergy symptoms can cause inflammation in the lungs and cause breathing difficulty.  I am not overly optimistic this will be helpful, but is worth a try.  If after 1 month it is not helpful, it is okay to discontinue.  Please let me know if you do this.  If the co-pay for the Symbicort is too high, please ask the pharmacist what the preferred agent would be and let us know.  If they are not helpful, please call us and we will help where we can.  Return to clinic in 6 weeks with pulmonary function test same day, follow-up with Dr. Silas Flood same day

## 2021-02-20 NOTE — Telephone Encounter (Signed)
Pt is asking for a call to know if there is a place in town for her to got to for PT for her neck.  Please call.

## 2021-02-20 NOTE — Telephone Encounter (Addendum)
Called patient and LVM asking for call back. When she calls back, please let her know that Jinny Blossom NP is ok with the patient doing outpatient physical therapy instead of at home. We have sent an order over to the neuro rehab here in the same building. Please tell her to give them a call to schedule. Their number is [phone].   OP PT ordered. Please cancel home health PT referral.

## 2021-02-20 NOTE — Telephone Encounter (Signed)
yes

## 2021-02-20 NOTE — Telephone Encounter (Signed)
Looks like pt is waiting on [facility] to get setup.

## 2021-02-20 NOTE — Addendum Note (Signed)
Addended by: Gildardo Griffes on: 02/20/2021 03:39 PM   Modules accepted: Orders

## 2021-02-24 ENCOUNTER — Other Ambulatory Visit (HOSPITAL_COMMUNITY): Payer: Self-pay | Admitting: Neurosurgery

## 2021-02-24 DIAGNOSIS — G959 Disease of spinal cord, unspecified: Secondary | ICD-10-CM

## 2021-02-28 ENCOUNTER — Ambulatory Visit: Payer: PPO | Attending: Adult Health | Admitting: Physical Therapy

## 2021-02-28 ENCOUNTER — Other Ambulatory Visit: Payer: Self-pay

## 2021-02-28 ENCOUNTER — Telehealth: Payer: Self-pay | Admitting: Physical Therapy

## 2021-02-28 ENCOUNTER — Encounter: Payer: Self-pay | Admitting: Physical Therapy

## 2021-02-28 DIAGNOSIS — M6281 Muscle weakness (generalized): Secondary | ICD-10-CM | POA: Insufficient documentation

## 2021-02-28 DIAGNOSIS — M542 Cervicalgia: Secondary | ICD-10-CM | POA: Insufficient documentation

## 2021-02-28 DIAGNOSIS — R293 Abnormal posture: Secondary | ICD-10-CM | POA: Diagnosis not present

## 2021-02-28 DIAGNOSIS — R2681 Unsteadiness on feet: Secondary | ICD-10-CM | POA: Insufficient documentation

## 2021-02-28 DIAGNOSIS — G20 Parkinson's disease: Secondary | ICD-10-CM | POA: Insufficient documentation

## 2021-02-28 DIAGNOSIS — R278 Other lack of coordination: Secondary | ICD-10-CM | POA: Insufficient documentation

## 2021-02-28 DIAGNOSIS — R2689 Other abnormalities of gait and mobility: Secondary | ICD-10-CM | POA: Insufficient documentation

## 2021-02-28 NOTE — Therapy (Addendum)
[facility] [address] [place], [place], [address] Phone: [phone]   Fax:  [phone]  Physical Therapy Evaluation  Patient Details  Name: Mandy Durham MRN: [id number] Date of Birth: [birth year] Referring Provider (PT): Ward Givens, NP   Encounter Date: 02/28/2021   PT End of Session - 02/28/21 1428     Visit Number 1    Number of Visits 25    Date for PT Re-Evaluation 05/29/21    Authorization Type [employer]    PT Start Time 0804    PT Stop Time 0850    PT Time Calculation (min) 46 min    Equipment Utilized During Treatment Gait belt    Activity Tolerance Patient tolerated treatment well    Behavior During Therapy [facility] for tasks assessed/performed             Past Medical History:  Diagnosis Date   Anxiety state, unspecified    Hyperlipidemia    Hypertension    Insomnia    LBBB (left bundle branch block)    LHC in 2002 showed normal coronaries.    Osteoarthrosis, unspecified whether generalized or localized, unspecified site    Overweight(278.02)    S/P placement of cardiac pacemaker    a. 02/21/16: Medtronic Advisa DR MRI SureScan model M3WG66 (serial number [id number])    Serrated adenoma of colon 2007   Symptomatic menopausal or female climacteric states    Syncope and collapse    11/12: Holter 12/12 with rare PACS, HR range 64-140, average 82, no significant arrhythmias. Echo (1/13): EF 50-55%, mild LVH, septal-lateral dyssynchrony c/w LBBB. 3-week event monitor (1/13): No significant arrhythmia.     Past Surgical History:  Procedure Laterality Date   COLONOSCOPY  2007   EP IMPLANTABLE DEVICE N/A 02/21/2016   Procedure: Pacemaker Implant;  Surgeon: Will Meredith Leeds, MD;  Location: [facility];  Service: Cardiovascular;  Laterality: N/A;   POLYPECTOMY      There were no vitals filed for this visit.    Subjective Assessment - 02/28/21 0807     Subjective Last  time she went to the neurologist, suspects that she has Parkinsonism (vascular). Taking Sinemet 4 times a day, reports that it helps, but not as much as it should. Reports that she is having trouble with balance. No falls. Does not use a device to help with walking.    Pertinent History PMH: Parkinsonism anxiety, HLD, HTN, pacemaker, hx of TBI (2016)    Limitations Walking;House hold activities;Standing    Patient Stated Goals Wants to improve her balance, posture.    Currently in Pain? Yes    Pain Score 3    "just a tad"   Pain Location Neck    Pain Orientation Posterior    Pain Descriptors / Indicators Sore    Pain Type Chronic pain    Aggravating Factors  Nothing, when she is up walking incr neck flexion    Pain Relieving Factors A couple of tylenol will ease the pain.                [facility] PT Assessment - 02/28/21 0819       Assessment   Medical Diagnosis Parkinsonism, neck pain    Referring Provider (PT) Ward Givens, NP    Onset Date/Surgical Date 02/20/21    Hand Dominance Right    Prior Therapy Previous PT at this location a long time aog      Precautions   Precautions  Fall    Precaution Comments Pacemaker      Balance Screen   Has the patient fallen in the past 6 months No    Has the patient had a decrease in activity level because of a fear of falling?  Yes    Is the patient reluctant to leave their home because of a fear of falling?  Yes      [place] residence    Living Arrangements Spouse/significant other    Type of [place] to enter    Entrance Stairs-Number of Steps 3    Entrance Stairs-Rails Right    [place] One level    [place] seat    Additional Comments Reports husband helps with "things". Not able to state specifics when asked.      Prior Function   Level of Independence Needs assistance with homemaking;Independent with household mobility without device       Observation/Other Assessments   Observations RLE>LLE and bilat hand tremors, incr during activity.      Sensation   Light Touch Impaired Detail    Additional Comments Reports numbness/tingling in bottom of feet      Coordination   Gross Motor Movements are Fluid and Coordinated No    Heel Shin Test incr difficulty performing on L side      Posture/Postural Control   Posture/Postural Control Postural limitations    Postural Limitations Increased thoracic kyphosis;Rounded Shoulders;Forward head;Flexed trunk    Posture Comments Neck flexed forwards and rotated to the L. More pronounced in standing during gait. Unable to hold head erect.      ROM / Strength   AROM / PROM / Strength Strength      Strength   Strength Assessment Site Hip;Knee;Ankle    Right/Left Hip Left;Right    Right Hip Flexion 4-/5    Left Hip Flexion 4+/5    Right/Left Knee Right;Left    Right Knee Flexion 4+/5    Right Knee Extension 4+/5    Left Knee Flexion 4+/5    Left Knee Extension 4+/5    Right/Left Ankle Right;Left    Right Ankle Dorsiflexion 4+/5    Left Ankle Dorsiflexion 4+/5      Transfers   Transfers Sit to Stand;Stand to Sit    Sit to Stand 5: Supervision;4: Min guard    Five time sit to stand comments  20.71 seconds no UE support, incr hip ADD when standing, forward flexed posture.    Stand to Sit 4: Min guard;5: Supervision      Ambulation/Gait   Ambulation/Gait Yes    Ambulation/Gait Assistance 4: Min guard    Assistive device None    Gait Pattern Step-through pattern;Decreased arm swing - right;Decreased arm swing - left;Decreased hip/knee flexion - right;Decreased hip/knee flexion - left;Decreased dorsiflexion - right;Decreased dorsiflexion - left;Decreased trunk rotation;Trunk flexed    Ambulation Surface Level;Indoor    Gait velocity 30.16 seconds= 1.09 ft/sec    Gait Comments Pt holds BUE in elbow flexion when ambulating with incr tremors in hands. Pt with incr neck flexion and L  rotation, forward flexed posture due to dystonia.      Standardized Balance Assessment   Standardized Balance Assessment Timed Up and Go Test      Timed Up and Go Test   Normal TUG (seconds) 22.72    TUG Comments turns en bloc  Objective measurements completed on examination: See above findings.         Pt reporting that one of her main complaints is her neck pain. Had lengthy discussion throughout eval that pt has 2 separate referrals for her neck pain as well as her Parkinsonism. Pt also has ortho eval scheduled next week for her neck at [facility]. Discussed that PT can treat pt at this location for both as neck pain/posture is likely related to her Parkinsonism instead of pt going to 2 different locations for therapy. Pt is also at a high risk for falls based on assessment measures today. Discussed these findings with pt's spouse at the end of eval. Pt's spouse wanting pt to receive therapy at this clinic, but pt would like to perform the eval [facility] [address] next week for her neck before she makes a decision on where to go. PT to reach out to pt's ortho PT at [facility] to discuss before ortho eval and determine where pt would best be served.        PT Education - 02/28/21 1428     Education Details See therapeutic activity section above.    Person(s) Educated Patient;Spouse    Methods Explanation    Comprehension Verbalized understanding             PT Short Term Goals - 03/02/21 1006       PT SHORT TERM GOAL #1   Title Pt will be independent in initial HEP with husband supervision in order to build upon functional gains made in therapy. ALL STGS DUE 03/20/21    Time 4    Period Weeks    Status New    Target Date 03/30/21      PT SHORT TERM GOAL #2   Title Cervical ROM to be assessed with STG/LTG written as appropriate.    Time 4    Period Weeks    Status New      PT SHORT TERM GOAL #3   Title Pt will decr TUG time  to 17 seconds or less in order to demo decr fall risk.    Baseline 22.72 seconds    Time 4    Period Weeks      PT SHORT TERM GOAL #4   Title Will perform push and release test with LTG written in order to demo improved balance reactions.    Time 4    Period Weeks    Status New      PT SHORT TERM GOAL #5   Title Pt will improve gait speed to at least 1.5 ft/sec with no AD vs. LRAD in order to demo decr fall risk.    Baseline 1.09 ft/sec with no AD    Time 4    Period Weeks    Status New             PT Long Term Goals - 03/02/21 1011       PT LONG TERM GOAL #1   Title Pt will be independent in final HEP with husband supervision in order to build upon functional gains made in therapy. ALL LTGS DUE 04/27/21    Time 8    Period Weeks    Status New    Target Date 04/27/21      PT LONG TERM GOAL #2   Title Push and release test to be assessed with LTG written in order to demo improved balance reactions.    Time 8    Period Weeks  Status New      PT LONG TERM GOAL #3   Title Pt will improve gait speed to at least 2.0 ft/sec with no AD vs. LRAD in order to demo decr fall risk.    Baseline 1.09 ft/sec no AD    Time 8    Period Weeks    Status New      PT LONG TERM GOAL #4   Title Pt will improve 5x sit <> stand to at least 16 seconds or less with no UE support in order to demo improved functional BLE strength.    Baseline 20.71 seconds, no UE support    Time 8    Period Weeks    Status New      PT LONG TERM GOAL #5   Title Pt will ambulate at least 500' over indoor and outdoor unlevel surfaces with supervision with no AD vs. LRAD in order to demo improved community mobility.    Time 8    Period Weeks    Status New                03/02/21 1002  Plan  Clinical Impression Statement Patient is a 75 year old female referred to Neuro OPPT for Parkinsonism with also a referral for neck pain.   Pt's PMH is significant for: Parkinsonism, anxiety, HLD, HTN,  pacemaker, syncope with collapse resulting in SAH/TBI 2016, aphasia/stuttering. The following deficits were present during the exam: incr tremors BUE and RLE>LLE - worse with activity, decr strength, postural abnormalities (incr trunk flexion, incr neck flexion with L cervical rotation), dystonia, impaired coordination, impaired sensation, gait abnormalities, impaired balance. Based on 5x sit <> stand, TUG, and gait speed, pt is an incr risk for falls. Pt would benefit from skilled PT to address these impairments and functional limitations to maximize functional mobility independence and decr fall risk.  Personal Factors and Comorbidities Comorbidity 3+;Past/Current Experience;Time since onset of injury/illness/exacerbation  Comorbidities PMH: Parkinsonism, anxiety, HLD, HTN, pacemaker, syncope with collapse resulting in SAH/TBI 2016, aphasia/stuttering  Examination-Activity Limitations Transfers;Stairs;Stand;Locomotion Level  Examination-Participation Restrictions Community Activity;Cleaning;Laundry;Driving  Pt will benefit from skilled therapeutic intervention in order to improve on the following deficits Abnormal gait;Decreased activity tolerance;Decreased coordination;Decreased balance;Decreased endurance;Decreased range of motion;Decreased strength;Difficulty walking;Impaired flexibility;Increased muscle spasms;Impaired sensation;Postural dysfunction;Pain;Decreased mobility;Decreased knowledge of use of DME  Stability/Clinical Decision Making Evolving/Moderate complexity  Clinical Decision Making Moderate  Rehab Potential Fair (due to deficits)  PT Frequency 2x / week  PT Duration 12 weeks  PT Treatment/Interventions ADLs/Self Care Home Management;Moist Heat;DME Instruction;Gait training;Stair training;Functional mobility training;Therapeutic activities;Neuromuscular re-education;Balance training;Therapeutic exercise;Patient/family education;Manual techniques;Passive range of motion;Dry  needling;Vestibular  PT Next Visit Plan further assess cervical ROM, push and release test. Initial HEP for functional strengthening, posture, balance.  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[place]) 09/07/2018   Functional dyspepsia 09/08/2017   Slow transit constipation 09/08/2017   S/P placement of cardiac pacemaker    Mobitz II 02/20/2016   Nystagmus, end-position 02/12/2016   Dizziness and giddiness 02/12/2016  Flapping tremor 02/12/2016   Chronic fatigue 10/28/2015   PCP NOTES >>>> 02/13/2015   TBI (traumatic brain injury) 01/28/2015   Essential hypertension    Acute post-traumatic headache, not intractable    Skull fracture (HCC)    Vertigo due to brain injury    SAH (subarachnoid hemorrhage) ([place]) 01/21/2015   Annual physical exam 09/07/2014   Orthostatic hypotension 08/30/2014   Acromioclavicular arthrosis 03/21/2014   Allergic rhinitis 02/27/2014   SI (sacroiliac) joint dysfunction 02/02/2014   Gastrocnemius strain, left 12/11/2013   Back pain 08/22/2013   LBBB (left bundle branch block) 05/13/2013   Shingles outbreak 12/15/2012   Borderline diabetes    Insomnia 01/12/2008   HLD (hyperlipidemia) 02/04/2007   Anxiety 10/12/2006   Osteoarthritis 10/12/2006    Arliss Journey, PT, DPT  02/28/2021, 2:30 PM  [facility] [address] [place], [place], [address] Phone: [phone]   Fax:  [phone]  Name: Mandy Durham MRN: [id number] Date of Birth: 08/14/1945

## 2021-03-02 NOTE — Addendum Note (Signed)
Addended by: Arliss Journey on: 03/02/2021 10:23 AM   Modules accepted: Orders

## 2021-03-04 ENCOUNTER — Ambulatory Visit: Payer: PPO | Admitting: Physical Therapy

## 2021-03-04 ENCOUNTER — Encounter: Payer: Self-pay | Admitting: Physical Therapy

## 2021-03-04 ENCOUNTER — Other Ambulatory Visit: Payer: Self-pay

## 2021-03-04 DIAGNOSIS — R293 Abnormal posture: Secondary | ICD-10-CM

## 2021-03-04 DIAGNOSIS — R278 Other lack of coordination: Secondary | ICD-10-CM | POA: Diagnosis not present

## 2021-03-04 DIAGNOSIS — M6281 Muscle weakness (generalized): Secondary | ICD-10-CM | POA: Diagnosis not present

## 2021-03-04 DIAGNOSIS — M542 Cervicalgia: Secondary | ICD-10-CM

## 2021-03-04 NOTE — Therapy (Signed)
[facility] [address] [place], [place], [address] Phone: [phone]   Fax:  [phone]  Physical Therapy Evaluation  Patient Details  Name: Mandy Durham MRN: [id number] Date of Birth: Jul 17, 1945 Referring Provider (PT): [name], [name], MD   Encounter Date: 03/04/2021   PT End of Session - 03/04/21 1252     Visit Number 2    Number of Visits 25    Date for PT Re-Evaluation 05/29/21    Authorization Type [employer]    PT Start Time 1150    PT Stop Time 1228    PT Time Calculation (min) 38 min    Activity Tolerance Patient tolerated treatment well    Behavior During Therapy [facility] for tasks assessed/performed             Past Medical History:  Diagnosis Date   Anxiety state, unspecified    Hyperlipidemia    Hypertension    Insomnia    LBBB (left bundle branch block)    LHC in 2002 showed normal coronaries.    Osteoarthrosis, unspecified whether generalized or localized, unspecified site    Overweight(278.02)    S/P placement of cardiac pacemaker    a. 02/21/16: Medtronic Advisa DR MRI SureScan model X5MW41 (serial number [id number])    Serrated adenoma of colon 2007   Symptomatic menopausal or female climacteric states    Syncope and collapse    11/12: Holter 12/12 with rare PACS, HR range 64-140, average 82, no significant arrhythmias. Echo (1/13): EF 50-55%, mild LVH, septal-lateral dyssynchrony c/w LBBB. 3-week event monitor (1/13): No significant arrhythmia.     Past Surgical History:  Procedure Laterality Date   COLONOSCOPY  2007   EP IMPLANTABLE DEVICE N/A 02/21/2016   Procedure: Pacemaker Implant;  Surgeon: Will Meredith Leeds, MD;  Location: [facility];  Service: Cardiovascular;  Laterality: N/A;   POLYPECTOMY      There were no vitals filed for this visit.    Subjective Assessment - 03/04/21 1152     Subjective Pt is a 75 y/o female who presents to OPPT for posterior neck pain which  she reports as tightness.  She has difficulty with trying to hold larger items.  She reports pain is chronic x several months with exacerbation over the past month or so.    Pertinent History PMH: Parkinsonism anxiety, HLD, HTN, pacemaker, hx of TBI (2016)    Limitations Walking;House hold activities;Standing    Patient Stated Goals Wants to improve her balance, posture; improve pain    Currently in Pain? Yes    Pain Score 3     Pain Location Neck    Pain Orientation Posterior    Pain Descriptors / Indicators Sore;Tightness;Squeezing;Cramping    Pain Type Chronic pain    Pain Onset More than a month ago    Pain Frequency Intermittent    Aggravating Factors  nothing, walking increases neck flexion    Pain Relieving Factors tylenol, movement                [facility] PT Assessment - 03/04/21 1159       Assessment   Medical Diagnosis M54.2 (ICD-10-CM) - Neck pain    Referring Provider (PT) [name], [name], MD    Onset Date/Surgical Date --   chronic x many months   Hand Dominance Right    Next MD Visit 04/14/21    Prior Therapy saw neuro PT last week, then neuro prior  Precautions   Precautions Fall    Precaution Comments Pacemaker      Balance Screen   Has the patient fallen in the past 6 months No    Has the patient had a decrease in activity level because of a fear of falling?  Yes    Is the patient reluctant to leave their home because of a fear of falling?  Yes      [place] residence    Type of [place] to enter    Entrance Stairs-Number of Steps 3    Entrance Stairs-Rails Right    Home Layout One level    [place] seat    Additional Comments Reports husband helps with "things". Not able to state specifics when asked.      Prior Function   Level of Independence Needs assistance with homemaking;Independent with household mobility without device    Vocation Retired       Observation/Other Assessments   Observations neck in flexed positioning; difficulty getting to neutral      Posture/Postural Control   Posture/Postural Control Postural limitations    Postural Limitations Increased thoracic kyphosis;Rounded Shoulders;Forward head;Flexed trunk    Posture Comments Neck flexed forwards and tilted to the L. More pronounced in standing during gait. Unable to hold head erect.      ROM / Strength   AROM / PROM / Strength AROM;Strength      AROM   AROM Assessment Site Cervical    Cervical Flexion 35 (from ~ 10 deg flexed position)    Cervical Extension 0 (from ~ 10 deg flexed position)    Cervical - Right Side Bend 10    Cervical - Left Side Bend 29      Strength   Strength Assessment Site Shoulder    Right/Left Shoulder Right;Left    Right Shoulder Flexion 3+/5    Left Shoulder Flexion 3+/5      Palpation   Palpation comment tightness noted Lt upper trap/levator                        Objective measurements completed on examination: See above findings.       [facility] Adult PT Treatment/Exercise - 03/04/21 1159       Exercises   Exercises Other Exercises    Other Exercises  see pt instructions - pt performed with mod verbal and tactile cues for proper technique.                     PT Education - 03/04/21 1251     Education Details HEP    Person(s) Educated Patient    Methods Explanation;Demonstration;Handout    Comprehension Verbalized understanding;Returned demonstration;Need further instruction              PT Short Term Goals - 03/04/21 1442       PT SHORT TERM GOAL #1   Title Pt will be independent in initial HEP with husband supervision in order to build upon functional gains made in therapy. ALL STGS DUE 03/20/21    Time 4    Period Weeks    Status New    Target Date 03/30/21      PT SHORT TERM GOAL #2   Title Cervical ROM to be assessed with STG/LTG written as appropriate.    Time 4    Period  Weeks    Status Achieved  PT SHORT TERM GOAL #3   Title Pt will decr TUG time to 17 seconds or less in order to demo decr fall risk.    Baseline 22.72 seconds    Time 4    Period Weeks      PT SHORT TERM GOAL #4   Title Will perform push and release test with LTG written in order to demo improved balance reactions.    Time 4    Period Weeks    Status New      PT SHORT TERM GOAL #5   Title Pt will improve gait speed to at least 1.5 ft/sec with no AD vs. LRAD in order to demo decr fall risk.    Baseline 1.09 ft/sec with no AD    Time 4    Period Weeks    Status New      Additional Short Term Goals   Additional Short Term Goals Yes      PT SHORT TERM GOAL #6   Title improve cervical Rt sidebending to at least 15 deg for improved mobility    Time 3    Period Weeks    Status New    Target Date 03/30/21               PT Long Term Goals - 03/04/21 1444       PT LONG TERM GOAL #1   Title Pt will be independent in final HEP with husband supervision in order to build upon functional gains made in therapy. ALL LTGS DUE 04/27/21    Time 8    Period Weeks    Status New    Target Date 04/27/21      PT LONG TERM GOAL #2   Title Push and release test to be assessed with LTG written in order to demo improved balance reactions.    Time 8    Period Weeks    Status New      PT LONG TERM GOAL #3   Title Pt will improve gait speed to at least 2.0 ft/sec with no AD vs. LRAD in order to demo decr fall risk.    Baseline 1.09 ft/sec no AD    Time 8    Period Weeks    Status New      PT LONG TERM GOAL #4   Title Pt will improve 5x sit <> stand to at least 16 seconds or less with no UE support in order to demo improved functional BLE strength.    Baseline 20.71 seconds, no UE support    Time 8    Period Weeks    Status New      PT LONG TERM GOAL #5   Title Pt will ambulate at least 500' over indoor and outdoor unlevel surfaces with supervision with no AD vs. LRAD in order  to demo improved community mobility.    Time 8    Period Weeks    Status New      Additional Long Term Goals   Additional Long Term Goals Yes      PT LONG TERM GOAL #6   Title report neck pain < 4/10 with activity for improved function    Status New      PT LONG TERM GOAL #7   Title demonstrate improved postural awareness and decreased episodes of cervical flexion when up amb at least 25% of the time    Status New  Plan - 03/04/21 1252     Clinical Impression Statement Pt is a 75 y/o female who presents to OPPT for chronic neck pain.  She demonstrates significant postural abnormalities, decreased strength and ROM affecting functional mobility.  Her cervical ROM and posture is likely complicated by Parkinson's and dystonia but feel pt will benefit from PT to address deficits and maximize function.  Pt also active at neuro clinic and will plan to transfer care to neuro to address all deficits.    Personal Factors and Comorbidities Comorbidity 3+;Past/Current Experience;Time since onset of injury/illness/exacerbation    Comorbidities PMH: Parkinsonism, anxiety, HLD, HTN, pacemaker, syncope with collapse resulting in SAH/TBI 2016, aphasia/stuttering    Examination-Activity Limitations Transfers;Stairs;Stand;Locomotion Level    Examination-Participation Restrictions Community Activity;Cleaning;Laundry;Driving    Stability/Clinical Decision Making Evolving/Moderate complexity    Clinical Decision Making Moderate    Rehab Potential Fair   due to deficits   PT Frequency 2x / week    PT Duration 12 weeks    PT Treatment/Interventions ADLs/Self Care Home Management;Moist Heat;DME Instruction;Gait training;Stair training;Functional mobility training;Therapeutic activities;Neuromuscular re-education;Balance training;Therapeutic exercise;Patient/family education;Manual techniques;Passive range of motion;Dry needling;Vestibular;Cryotherapy;Electrical  Stimulation;Traction;Ultrasound;Taping    PT Next Visit Plan review cervical HEP and update PRN, needs general UE strengthening, push and release test. Initial HEP for functional strengthening, posture, balance.    PT Home Exercise Plan Access Code: [email or web address]    Recommended Other Services OT/ST (will defer to neuro PT for formal referral if deemed appropriate)    Consulted and Agree with Plan of Care Patient;Family member/caregiver    Family Member Consulted pt's husband Eddie             Patient will benefit from skilled therapeutic intervention in order to improve the following deficits and impairments:  Abnormal gait, Decreased activity tolerance, Decreased coordination, Decreased balance, Decreased endurance, Decreased range of motion, Decreased strength, Difficulty walking, Impaired flexibility, Increased muscle spasms, Impaired sensation, Postural dysfunction, Pain, Decreased mobility, Decreased knowledge of use of DME, Impaired tone, Increased fascial restricitons, Impaired UE functional use  Visit Diagnosis: Abnormal posture - Plan: PT plan of care cert/re-cert  Cervicalgia - Plan: PT plan of care cert/re-cert  Muscle weakness (generalized) - Plan: PT plan of care cert/re-cert  Other lack of coordination - Plan: PT plan of care cert/re-cert     Problem List Patient Active Problem List   Diagnosis Date Noted   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   Functional dyspepsia 09/08/2017   Slow transit constipation 09/08/2017   S/P placement of cardiac pacemaker    M[place]itz II 02/20/2016   Nystagmus, end-position 02/12/2016   Dizziness and giddiness 02/12/2016   Flapping tremor 02/12/2016   Chronic fatigue 10/28/2015   PCP NOTES >>>> 02/13/2015   TBI (traumatic brain injury) 01/28/2015   Essential hypertension    Acute post-traumatic headache, not intractable    Skull fracture (HCC)    Vertigo due to brain injury    SAH (subarachnoid hemorrhage) ([place]) 01/21/2015   Annual  physical exam 09/07/2014   Orthostatic hypotension 08/30/2014   Acromioclavicular arthrosis 03/21/2014   Allergic rhinitis 02/27/2014   SI (sacroiliac) joint dysfunction 02/02/2014   Gastrocnemius strain, left 12/11/2013   Back pain 08/22/2013   LBBB (left bundle branch block) 05/13/2013   Shingles outbreak 12/15/2012   Borderline diabetes    Insomnia 01/12/2008   HLD (hyperlipidemia) 02/04/2007   Anxiety 10/12/2006   Osteoarthritis 10/12/2006      Laureen Abrahams, PT, DPT 03/04/21 2:47 PM     [facility]  Physical Therapy [address] [place], [place], [address] Phone: [phone]   Fax:  [phone]  Name: Sway MACKENZYE MACKEL MRN: [id number] Date of Birth: Jan 05, 1946

## 2021-03-04 NOTE — Patient Instructions (Signed)
Access Code: [email or web address] URL: [email or web address] Date: 03/04/2021 Prepared by: Faustino Congress  Exercises Seated Chin Tuck with Neck Elongation - 2 x daily - 7 x weekly - 1 sets - 10 reps - 5 sec hold Seated Upper Trapezius Stretch - 2 x daily - 7 x weekly - 1 sets - 3 reps - 30 sec hold Seated Scapular Retraction - 2 x daily - 7 x weekly - 1 sets - 10 reps - 5 sec hold Seated Cervical Retraction and Rotation - 2 x daily - 7 x weekly - 1 sets - 10 reps - 5-10 sec hold

## 2021-03-12 ENCOUNTER — Encounter: Payer: Self-pay | Admitting: Physical Therapy

## 2021-03-12 ENCOUNTER — Other Ambulatory Visit: Payer: Self-pay

## 2021-03-12 ENCOUNTER — Ambulatory Visit: Payer: PPO | Admitting: Physical Therapy

## 2021-03-12 DIAGNOSIS — M542 Cervicalgia: Secondary | ICD-10-CM

## 2021-03-12 DIAGNOSIS — R293 Abnormal posture: Secondary | ICD-10-CM

## 2021-03-12 DIAGNOSIS — M6281 Muscle weakness (generalized): Secondary | ICD-10-CM

## 2021-03-12 DIAGNOSIS — R2681 Unsteadiness on feet: Secondary | ICD-10-CM

## 2021-03-12 DIAGNOSIS — R2689 Other abnormalities of gait and mobility: Secondary | ICD-10-CM

## 2021-03-12 NOTE — Therapy (Signed)
[facility] [address] [place], [place], [address] Phone: [phone]   Fax:  [phone]  Physical Therapy Treatment  Patient Details  Name: Mandy Durham MRN: [id number] Date of Birth: Jun 30, 1945 Referring Provider (PT): [name], [name], MD   Encounter Date: 03/12/2021   PT End of Session - 03/12/21 1235     Visit Number 3    Number of Visits 25    Date for PT Re-Evaluation 05/29/21    Authorization Type [employer]    PT Start Time 1232    PT Stop Time 1318    PT Time Calculation (min) 46 min    Activity Tolerance Patient tolerated treatment well    Behavior During Therapy [facility] for tasks assessed/performed             Past Medical History:  Diagnosis Date   Anxiety state, unspecified    Hyperlipidemia    Hypertension    Insomnia    LBBB (left bundle branch block)    LHC in 2002 showed normal coronaries.    Osteoarthrosis, unspecified whether generalized or localized, unspecified site    Overweight(278.02)    S/P placement of cardiac pacemaker    a. 02/21/16: Medtronic Advisa DR MRI SureScan model L3TD42 (serial number [id number])    Serrated adenoma of colon 2007   Symptomatic menopausal or female climacteric states    Syncope and collapse    11/12: Holter 12/12 with rare PACS, HR range 64-140, average 82, no significant arrhythmias. Echo (1/13): EF 50-55%, mild LVH, septal-lateral dyssynchrony c/w LBBB. 3-week event monitor (1/13): No significant arrhythmia.     Past Surgical History:  Procedure Laterality Date   COLONOSCOPY  2007   EP IMPLANTABLE DEVICE N/A 02/21/2016   Procedure: Pacemaker Implant;  Surgeon: Will Meredith Leeds, MD;  Location: [facility];  Service: Cardiovascular;  Laterality: N/A;   POLYPECTOMY      There were no vitals filed for this visit.   Subjective Assessment - 03/12/21 1236     Subjective Back is hurting more and up through the neck. No  falls. Asking about some of her exercises that she got at her ortho eval last week.    Pertinent History PMH: Parkinsonism anxiety, HLD, HTN, pacemaker, hx of TBI (2016)    Limitations Walking;House hold activities;Standing    Patient Stated Goals Wants to improve her balance, posture; improve pain    Currently in Pain? Yes    Pain Score 4     Pain Location Neck    Pain Orientation Posterior    Pain Descriptors / Indicators Sore;Tightness    Pain Type Chronic pain    Pain Onset More than a month ago    Aggravating Factors  breathing hard    Pain Relieving Factors tylenol, getting in a position where her neck isn't pulling                               [facility] Adult PT Treatment/Exercise - 03/12/21 1424       Self-Care   Self-Care Other Self-Care Comments    Other Self-Care Comments  Discussed posture/neck pain is likely related to dystonia related to pt's Parkinsonism and explained what it is. Pt asking about getting Botox for her neck here, discussed that this will need to be something that pt discusses with her neurologist, but that is something that can be used in management of dystonia  if indicated. Discussed what PT will focus on and purpose of exercises. Pt also asking about speech therapy due to stuttering (received it previously at this clinic), PT would need to request new referral from MD. Pt would rather hold off at this time and focus on PT                Access Code: [facility] URL: [email or web address] Date: 03/12/2021 Prepared by: [name]  Pt wishing to review entirety of HEP given to pt at ortho eval for neck pain. See MedBridge for more details. Discussed performing seated exercises in a chair with back support for improved positioning/posture. Pt needing verbal/visual/tactile cues for performance of exercises.   Exercises Seated Upper Trapezius Stretch - 2 x daily - 7 x weekly - 1 sets - 3 reps - 30 sec hold - needed  verbal and visual cues for proper technique, cues to gently look up to the ceiling for more of a stretch, performed going to R.  Seated Scapular Retraction - 2 x daily - 7 x weekly - 1 sets - 10 reps - 5 sec hold- tactile cues for technique  Seated Cervical Retraction and Rotation - 2 x daily - 7 x weekly - 1 sets - 10 reps - 5-10 sec hold - performed as cervical rotation to R/L  Supine Cervical Retraction with Towel - 2 x daily - 7 x weekly - 1 sets - 10 reps - 3 hold - performing with pillow, cued to perform a gentle chin tuck first, initial tactile cues for technique  Supine Scapular Retraction - 2 x daily - 7 x weekly - 1-2 sets - 10 reps - initial tactile cues for technique    Removed seated cervical retraction and replaced with performing in supine due to better positioning and allowing gravity to assist with posture. Pt unable to perform with proper technique in seated at this time. Pt reporting it also felt good to lie on her back.   Provided additional written instructions on pt's HEP to aide in carryover for home.    PT Education - 03/12/21 1422     Education Details Scheduling more visits - plan for 2x week for 4 weeks and 1x week for 4 weeks. Reviewed and updated HEP - gave new handout with written instructions and pt needing verbal/tactile cues for technique.    Person(s) Educated Patient    Methods Explanation;Demonstration;Verbal cues;Handout;Tactile cues    Comprehension Verbalized understanding;Returned demonstration;Need further instruction              PT Short Term Goals - 03/04/21 1442       PT SHORT TERM GOAL #1   Title Pt will be independent in initial HEP with husband supervision in order to build upon functional gains made in therapy. ALL STGS DUE 03/20/21    Time 4    Period Weeks    Status New    Target Date 03/30/21      PT SHORT TERM GOAL #2   Title Cervical ROM to be assessed with STG/LTG written as appropriate.    Time 4    Period Weeks    Status  Achieved      PT SHORT TERM GOAL #3   Title Pt will decr TUG time to 17 seconds or less in order to demo decr fall risk.    Baseline 22.72 seconds    Time 4    Period Weeks      PT SHORT TERM GOAL #4   Title  Will perform push and release test with LTG written in order to demo improved balance reactions.    Time 4    Period Weeks    Status New      PT SHORT TERM GOAL #5   Title Pt will improve gait speed to at least 1.5 ft/sec with no AD vs. LRAD in order to demo decr fall risk.    Baseline 1.09 ft/sec with no AD    Time 4    Period Weeks    Status New      Additional Short Term Goals   Additional Short Term Goals Yes      PT SHORT TERM GOAL #6   Title improve cervical Rt sidebending to at least 15 deg for improved mobility    Time 3    Period Weeks    Status New    Target Date 03/30/21               PT Long Term Goals - 03/04/21 1444       PT LONG TERM GOAL #1   Title Pt will be independent in final HEP with husband supervision in order to build upon functional gains made in therapy. ALL LTGS DUE 04/27/21    Time 8    Period Weeks    Status New    Target Date 04/27/21      PT LONG TERM GOAL #2   Title Push and release test to be assessed with LTG written in order to demo improved balance reactions.    Time 8    Period Weeks    Status New      PT LONG TERM GOAL #3   Title Pt will improve gait speed to at least 2.0 ft/sec with no AD vs. LRAD in order to demo decr fall risk.    Baseline 1.09 ft/sec no AD    Time 8    Period Weeks    Status New      PT LONG TERM GOAL #4   Title Pt will improve 5x sit <> stand to at least 16 seconds or less with no UE support in order to demo improved functional BLE strength.    Baseline 20.71 seconds, no UE support    Time 8    Period Weeks    Status New      PT LONG TERM GOAL #5   Title Pt will ambulate at least 500' over indoor and outdoor unlevel surfaces with supervision with no AD vs. LRAD in order to demo improved  community mobility.    Time 8    Period Weeks    Status New      Additional Long Term Goals   Additional Long Term Goals Yes      PT LONG TERM GOAL #6   Title report neck pain < 4/10 with activity for improved function    Status New      PT LONG TERM GOAL #7   Title demonstrate improved postural awareness and decreased episodes of cervical flexion when up amb at least 25% of the time    Status New                   Plan - 03/12/21 1434     Clinical Impression Statement Pt underwent ortho eval last week for neck pain, pt to receive therapy for both neck pain/PD at neuro [facility]. Pt wanting to review HEP she got for neck pain. Educated on purpose of each exercise. Pt  needing verbal/demo and tactile cues to perform initially. Replaced seated cervical retraction to performing in supine for improved positioning and technique. Took increased time and review of exercises. Provided pt on handout with additional simple cues that will hopefully help carryover at home. Will continue to progress towards LTGs.    Personal Factors and Comorbidities Comorbidity 3+;Past/Current Experience;Time since onset of injury/illness/exacerbation    Comorbidities PMH: Parkinsonism, anxiety, HLD, HTN, pacemaker, syncope with collapse resulting in SAH/TBI 2016, aphasia/stuttering    Examination-Activity Limitations Transfers;Stairs;Stand;Locomotion Level    Examination-Participation Restrictions Community Activity;Cleaning;Laundry;Driving    Stability/Clinical Decision Making Evolving/Moderate complexity    Rehab Potential Fair   due to deficits   PT Frequency 2x / week    PT Duration 12 weeks    PT Treatment/Interventions ADLs/Self Care Home Management;Moist Heat;DME Instruction;Gait training;Stair training;Functional mobility training;Therapeutic activities;Neuromuscular re-education;Balance training;Therapeutic exercise;Patient/family education;Manual techniques;Passive range of motion;Dry  needling;Vestibular;Cryotherapy;Electrical Stimulation;Traction;Ultrasound;Taping    PT Next Visit Plan review cervical HEP and update PRN, needs general UE strengthening, push and release test.add to initial HEP for functional strengthening, posture, balance.    PT Home Exercise Plan Access Code: [email or web address]    Consulted and Agree with Plan of Care Patient;Family member/caregiver             Patient will benefit from skilled therapeutic intervention in order to improve the following deficits and impairments:  Abnormal gait, Decreased activity tolerance, Decreased coordination, Decreased balance, Decreased endurance, Decreased range of motion, Decreased strength, Difficulty walking, Impaired flexibility, Increased muscle spasms, Impaired sensation, Postural dysfunction, Pain, Decreased mobility, Decreased knowledge of use of DME, Impaired tone, Increased fascial restricitons, Impaired UE functional use  Visit Diagnosis: Abnormal posture  Cervicalgia  Muscle weakness (generalized)  Other abnormalities of gait and mobility  Unsteadiness on feet     Problem List Patient Active Problem List   Diagnosis Date Noted   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   Functional dyspepsia 09/08/2017   Slow transit constipation 09/08/2017   S/P placement of cardiac pacemaker    M[place] II 02/20/2016   Nystagmus, end-position 02/12/2016   Dizziness and giddiness 02/12/2016   Flapping tremor 02/12/2016   Chronic fatigue 10/28/2015   PCP NOTES >>>> 02/13/2015   TBI (traumatic brain injury) 01/28/2015   Essential hypertension    Acute post-traumatic headache, not intractable    Skull fracture (H[facility])    Vertigo due to brain injury    SAH (subarachnoid hemorrhage) ([place]) 01/21/2015   Annual physical exam 09/07/2014   Orthostatic hypotension 08/30/2014   Acromioclavicular arthrosis 03/21/2014   Allergic rhinitis 02/27/2014   SI (sacroiliac) joint dysfunction 02/02/2014   Gastrocnemius strain,  left 12/11/2013   Back pain 08/22/2013   LBBB (left bundle branch block) 05/13/2013   Shingles outbreak 12/15/2012   Borderline diabetes    Insomnia 01/12/2008   HLD (hyperlipidemia) 02/04/2007   Anxiety 10/12/2006   Osteoarthritis 10/12/2006    Arliss Journey, PT, DPT  03/12/2021, 2:37 PM  [facility] [address] [place], [place], [address] Phone: [phone]   Fax:  [phone]  Name: Mandy Durham MRN: [id number] Date of Birth: 17-Mar-1945

## 2021-03-12 NOTE — Patient Instructions (Signed)
Access Code: [email or web address] URL: [email or web address] Date: 03/12/2021 Prepared by: [name]  Exercises Seated Upper Trapezius Stretch - 2 x daily - 7 x weekly - 1 sets - 3 reps - 30 sec hold Seated Scapular Retraction - 2 x daily - 7 x weekly - 1 sets - 10 reps - 5 sec hold Seated Cervical Retraction and Rotation - 2 x daily - 7 x weekly - 1 sets - 10 reps - 5-10 sec hold Supine Cervical Retraction with Towel - 2 x daily - 7 x weekly - 1 sets - 10 reps - 3 hold Supine Scapular Retraction - 2 x daily - 7 x weekly - 1-2 sets - 10 reps

## 2021-03-23 NOTE — Progress Notes (Signed)
Cardiology Office Note Date:  03/26/2021  Patient ID:  Mandy Durham, Car January 12, 1946, MRN [id number] PCP:  Colon Branch, MD  Cardiologist: Dr. Aundra Dubin (2017)  Electrophysiologist:  Dr. Curt Bears    Chief Complaint:   planned follow up  History of Present Illness: Mandy Durham is a 76 y.o. female with history of HTN, HLD, LBBB (since around 2000, remote notes report LHC in 2002 showed normal coronaries), traumatic SAH (2016)advanced heart block w/PPM. She has a stuttering speech pattern and a R hand/forearm tremor she state since the time of her head injury. Vascular Parkinsonism  I saw her 10/06/19, for CP She reports for the last couple months getting SOB easily.  Reports that before she had no difficulties with her ADLs, gives an example of walking to her mail box and back without SOB and now has to catch her breath at the mailbox to walk back, and is SOB once back at the house.  When asked how far a walk it is she reports "not too far" She will get winded with some house chores, not others.  This very unusual for her. When asked about CP, she says no, just very SOB. Discussed if any heaviness, pressure, she says no. She mentions that she told Dr. Curt Bears last time here and he stopped one of her medicines but that did not help.  When I tell her that was end of March she is surprised, states she did not think she had been feeling poorly that long. She is certain and firm that she is not having CP, but the SOB does make her anxious No rest SOB, no dizzy spells, near syncope or syncope  She denies any swelling ,bolating, no symptoms of orthopnea or PND. Noted VP only 2%, planned for an echo and stress test to evaluate her progressive DOE Stress test was low risk with no ischemia or prior infarct Echo looked OK, LVEF 50-55%, Anteroseptal and inferoseptal hypokinesis.  Left ventricular diastolic parameters are consistent with Grade I diastolic dysfunction (impaired relaxation). Elevated left  ventricular end-diastolic pressure. RV OK, no significant VHD She was started on lasix and then uptitrated and planned for follow up f/u BMET was stable  F/u 12/04/19 She feels OK, thinks the lasix has helped, still has some days that she feels a little winded with activities, but all in all better. No CP, palpitations No rest SOB, no symptoms of PND or orthopnea, though wakes a few times to urinate. Since our last visit she has hd once or twice both while seated a very fleeting/sudden dizzy spell that she though she was blacking out.  Over a fast as it started. No changes, labs done, planned to monitor for recurrent symptoms  She has seen Dr. Curt Bears subsequently doing well, no changes made at his visit  More recently saw A. Albertina [place], [place], March 2022 he discussed worsening SOB over the year despiet  and referred to pulmonology given unremarkable cardiac work up.   I saw her 01/17/21 She feels pretty lousy, says her SOB is significant and she can hardly take care of the day to day activities. That being said, she reports that she has felt this way at least 65mo, since she saw Jonni Sanger, and has been SOB for more then a year. SHe is clear that how she feels today is how she felt in March, not particularly escalating, but none better. She saw pulmonology and reports that she was supposed to be getting a test done, but has  not yet She has had some CP this is random, Infrequent sharp and momentary No near syncope or syncope. She denies symptoms of orthopnea, sometimes she feels SOB seated and standing up seems to help, though her SOB is always the worst when exerting herself, settles after a few minutes No palpitations  Felt to be volume OL,, turned out she was not taking lasix it seemed since originally prescribed, thinking was to use when she was having difficulty urinating She reported her current state of symptoms as ongoing for a year, had not had any acute/recent progression She was advised to  start the lasix taking it daily , updated labs Discussed ER precautions with her  She saw her PMD 01/22/21, discussed her anxiety better with Lexapro, and failure to thrive Described as slightly more frail, was caring for her huband No mention or noted c/o SOB  I saw her 01/31/21 She is a little less SOB but not resolved or to her baseline No CP, no palpitations No near syncope or syncope LOOKED much better, though some volume OL remained Lasix was pulsed again  02/13/21, saw neurology team with increased parkinson symptoms and her Sinemet increased and planned for PT 02/20/21, saw pulmonary, SOB felt multifactorial, with some atopic symptoms trial on  ICS/LABA although not optimistic will be very helpful   TODAY She again looks better to me, leaning less, breathing comfortably, more conversation then a couple visits ago for sure She reports that she feels SOB and thinks she needs more furosemide. By her observation for the few days on higher furosemide her breathing was better but then at 20mg  daily gt more winded again. No CP, no palpitations No near syncope or syncope She is very trouble with the thought she has or may have Parkinson's  Device information MDT dual chamber PPM implanted 02/21/2016  Past Medical History:  Diagnosis Date   Anxiety state, unspecified    Hyperlipidemia    Hypertension    Insomnia    LBBB (left bundle branch block)    LHC in 2002 showed normal coronaries.    Osteoarthrosis, unspecified whether generalized or localized, unspecified site    Overweight(278.02)    S/P placement of cardiac pacemaker    a. 02/21/16: Medtronic Advisa DR MRI SureScan model X5QM08 (serial number [id number])    Serrated adenoma of colon 2007   Symptomatic menopausal or female climacteric states    Syncope and collapse    11/12: Holter 12/12 with rare PACS, HR range 64-140, average 82, no significant arrhythmias. Echo (1/13): EF 50-55%, mild LVH, septal-lateral  dyssynchrony c/w LBBB. 3-week event monitor (1/13): No significant arrhythmia.     Past Surgical History:  Procedure Laterality Date   COLONOSCOPY  2007   EP IMPLANTABLE DEVICE N/A 02/21/2016   Procedure: Pacemaker Implant;  Surgeon: Will Meredith Leeds, MD;  Location: [facility];  Service: Cardiovascular;  Laterality: N/A;   POLYPECTOMY      Current Outpatient Medications  Medication Sig Dispense Refill   acetaminophen (TYLENOL) 500 MG tablet Take 500 mg by mouth every 6 (six) hours as needed.     aspirin EC 81 MG tablet Take 81 mg by mouth daily. Swallow whole.     budesonide-formoterol (SYMBICORT) 160-4.5 MCG/ACT inhaler Inhale 2 puffs into the lungs 2 (two) times daily. 1 each 2   carbidopa-levodopa (SINEMET IR) 25-100 MG tablet Take 1 tablet by mouth 4 (four) times daily. 360 tablet 3   Cholecalciferol (VITAMIN D PO) Take 1 tablet  by mouth 2 (two) times a week. Vitamin D     escitalopram (LEXAPRO) 10 MG tablet Take 1 tablet (10 mg total) by mouth daily. 30 tablet 6   ezetimibe (ZETIA) 10 MG tablet Take 1 tablet (10 mg total) by mouth daily. 90 tablet 1   losartan (COZAAR) 100 MG tablet Take 1 tablet (100 mg total) by mouth daily. 30 tablet 3   nitroGLYCERIN (NITROSTAT) 0.4 MG SL tablet Place 1 tablet (0.4 mg total) under the tongue every 5 (five) minutes x 3 doses as needed for chest pain. 25 tablet 1   furosemide (LASIX) 20 MG tablet Take 1 tablet (20 mg total) by mouth daily. Alternating 40 mg every other day 90 tablet 3   No current facility-administered medications for this visit.    Allergies:   Patient has no known allergies.   Social History:  The patient  reports that she has never smoked. She has never used smokeless tobacco. She reports that she does not drink alcohol and does not use drugs.   Family History:  The patient's family history includes Coronary artery disease in her mother; Dementia in her mother; Diabetes in her mother; Heart failure in her  mother.  ROS:  Please see the history of present illness.  All other systems are reviewed and otherwise negative.   PHYSICAL EXAM:  VS:  BP 138/68    Pulse 90    Ht 5\' 5"  (1.651 m)    Wt 107 lb (48.5 kg)    SpO2 98%    BMI 17.81 kg/m  BMI: Body mass index is 17.81 kg/m. Well nourished, well developed, in no acute distress, appears frail, but more sturdy today HEENT: normocephalic, atraumatic  Neck: no JVD, carotid bruits or masses Cardiac:  RRR; tachycardic, soft SM, no rubs, or gallops Lungs:   CTA b/l, no wheezing, rhonchi or rales  Abd: soft, nontender MS: no deformity, thin body habitus, age appropriate atrophy Ext:  trace if any edema  Skin: warm and dry, no rash Neuro:  She has a stuttered speech pattern and a resting R hand/forearm tremor, leans to the left today though not as much, she says this has been for the last 6 months or so too, c/w  neurology, otherwise no gross deficits appreciated Psych: euthymic mood, full affect  PPM site is stable, no tethering or discomfort   EKG:  not done today   PPM interrogation done today and reviewed by myself: Battery and lead measurements are good No arrhythmias  10/20/2019: TTE IMPRESSIONS  1. Anteroseptal and inferoseptal hypokinesis. Left ventricular ejection  fraction, by estimation, is 50 to 55%. The left ventricle has low normal  function. The left ventricle demonstrates regional wall motion  abnormalities (see scoring diagram/findings  for description). Left ventricular diastolic parameters are consistent  with Grade I diastolic dysfunction (impaired relaxation). Elevated left  ventricular end-diastolic pressure.   2. Right ventricular systolic function is normal. The right ventricular  size is normal. There is normal pulmonary artery systolic pressure.   3. Left atrial size was mildly dilated.   4. The mitral valve is normal in structure. Trivial mitral valve  regurgitation. No evidence of mitral stenosis.   5.  Tricuspid valve regurgitation is mild to moderate.   6. The aortic valve is tricuspid. Aortic valve regurgitation is not  visualized. No aortic stenosis is present.   7. The inferior vena cava is normal in size with greater than 50%  respiratory variability, suggesting right atrial pressure of  3 mmHg.     10/20/2019; lexiscan stress myoview Nuclear stress EF: 50%. The left ventricular ejection fraction is mildly decreased (45-54%). Visually, the EF appears greater than the 50% computer generated EF There was no ST segment deviation noted during stress. This is a low risk study. There is no evidence of ischemia and no evidence of previous infarction. The study is normal.    TTE 07/17/15  Review of the above records today demonstrates:  - Left ventricle: The cavity size was normal. There was severe   focal basal hypertrophy of the septum. Systolic function was   normal. The estimated ejection fraction was in the range of 50%   to 55%. Anteroseptal hypokinesis. Doppler parameters are   consistent with abnormal left ventricular relaxation (grade 1   diastolic dysfunction). The E/e&' ratio is between 8-15,   suggesting indeterminate LV filling pressure. - Mitral valve: Mildly thickened leaflets . There was trivial   regurgitation. - Left atrium: The atrium was normal in size. - Tricuspid valve: There was mild regurgitation. - Pulmonary arteries: PA peak pressure: 32 mm Hg (S). - Inferior vena cava: The vessel was normal in size. The   respirophasic diameter changes were in the normal range (>= 50%),   consistent with normal central venous pressure.  Recent Labs: 06/07/2020: ALT 16; TSH 1.31 01/17/2021: NT-Pro BNP 724 01/22/2021: BUN 19; Creatinine, Ser 0.78; Hemoglobin 12.6; Platelets 254.0; Potassium 3.8; Sodium 140  06/07/2020: Cholesterol 234; HDL 119; LDL Cholesterol (Calc) 100; Total CHOL/HDL Ratio 2.0; Triglycerides 66   CrCl cannot be calculated (Patient's most recent lab result is  older than the maximum 21 days allowed.).   Wt Readings from Last 3 Encounters:  03/26/21 107 lb (48.5 kg)  02/20/21 107 lb 3.2 oz (48.6 kg)  01/31/21 109 lb (49.4 kg)     Other studies reviewed: Additional studies/records reviewed today include: summarized above  ASSESSMENT AND PLAN:  1. PPM     Intact function     no programming changes amde  2. HTN     Looks OK  3. Diastolic dysfunction 4. Acute/chronic CHF     Exam is better, weight is stable     Symptoms/by her observations she felt better on increased lasix dose, discussed balance of enough lasix/not too much lasix  Will increase to 40mg  alternating days with 20mg  BMET in 2 weeks   Disposition: remotes as usual, in clinic in 34mo, sooner if needed.       Current medicines are reviewed at length with the patient today.  The patient did not have any concerns regarding medicines.  Venetia Night, PA-C 03/26/2021 5:04 PM     [facility] [address] [place] [address] [place] [address] [phone] (office)  [phone] (fax)

## 2021-03-25 ENCOUNTER — Ambulatory Visit: Payer: PPO | Attending: Adult Health | Admitting: Physical Therapy

## 2021-03-25 ENCOUNTER — Encounter: Payer: Self-pay | Admitting: Physical Therapy

## 2021-03-25 ENCOUNTER — Other Ambulatory Visit: Payer: Self-pay

## 2021-03-25 VITALS — BP 144/83 | HR 85

## 2021-03-25 DIAGNOSIS — R2689 Other abnormalities of gait and mobility: Secondary | ICD-10-CM | POA: Diagnosis not present

## 2021-03-25 DIAGNOSIS — R293 Abnormal posture: Secondary | ICD-10-CM | POA: Diagnosis not present

## 2021-03-25 DIAGNOSIS — R2681 Unsteadiness on feet: Secondary | ICD-10-CM | POA: Diagnosis not present

## 2021-03-25 DIAGNOSIS — M542 Cervicalgia: Secondary | ICD-10-CM | POA: Insufficient documentation

## 2021-03-25 DIAGNOSIS — M6281 Muscle weakness (generalized): Secondary | ICD-10-CM | POA: Insufficient documentation

## 2021-03-25 NOTE — Patient Instructions (Signed)
Access Code: [email or web address] URL: [email or web address] Date: 03/25/2021 Prepared by: [name]  Exercises Seated Upper Trapezius Stretch - 2 x daily - 7 x weekly - 1 sets - 3 reps - 30 sec hold Seated Scapular Retraction - 2 x daily - 7 x weekly - 1 sets - 10 reps - 5 sec hold Seated Cervical Retraction and Rotation - 2 x daily - 7 x weekly - 1 sets - 10 reps - 5-10 sec hold Supine Cervical Retraction with Towel - 2 x daily - 7 x weekly - 1 sets - 10 reps - 3 hold Supine Scapular Retraction - 2 x daily - 7 x weekly - 1-2 sets - 10 reps Sit to Stand with Armchair - 1-2 x daily - 5 x weekly - 2 sets - 4 reps Alternating Step Taps with Counter Support - 1-2 x daily - 5 x weekly - 2 sets - 5 reps

## 2021-03-25 NOTE — Therapy (Signed)
[facility] [address] [place], [place], [address] Phone: [phone]   Fax:  [phone]  Physical Therapy Treatment  Patient Details  Name: Mandy Durham MRN: [id number] Date of Birth: May 26, 1945 Referring Provider (PT): [name], [name], MD   Encounter Date: 03/25/2021   PT End of Session - 03/25/21 1154     Visit Number 4    Number of Visits 25    Date for PT Re-Evaluation 05/29/21    Authorization Type [employer]    PT Start Time 1101    PT Stop Time 1147    PT Time Calculation (min) 46 min    Activity Tolerance Patient tolerated treatment well;Other (comment)   limited by SOB with standing activity   Behavior During Therapy [facility] for tasks assessed/performed             Past Medical History:  Diagnosis Date   Anxiety state, unspecified    Hyperlipidemia    Hypertension    Insomnia    LBBB (left bundle branch block)    LHC in 2002 showed normal coronaries.    Osteoarthrosis, unspecified whether generalized or localized, unspecified site    Overweight(278.02)    S/P placement of cardiac pacemaker    a. 02/21/16: Medtronic Advisa DR MRI SureScan model Q2WL79 (serial number [id number])    Serrated adenoma of colon 2007   Symptomatic menopausal or female climacteric states    Syncope and collapse    11/12: Holter 12/12 with rare PACS, HR range 64-140, average 82, no significant arrhythmias. Echo (1/13): EF 50-55%, mild LVH, septal-lateral dyssynchrony c/w LBBB. 3-week event monitor (1/13): No significant arrhythmia.     Past Surgical History:  Procedure Laterality Date   COLONOSCOPY  2007   EP IMPLANTABLE DEVICE N/A 02/21/2016   Procedure: Pacemaker Implant;  Surgeon: Will Meredith Leeds, MD;  Location: [facility];  Service: Cardiovascular;  Laterality: N/A;   POLYPECTOMY      Vitals:   03/25/21 1130  BP: (!) 144/83  Pulse: 85     Subjective Assessment - 03/25/21 1104      Subjective Been doing the exercises more and feels as if her neck is getting better. No falls.    Pertinent History PMH: Parkinsonism anxiety, HLD, HTN, pacemaker, hx of TBI (2016)    Limitations Walking;House hold activities;Standing    Patient Stated Goals Wants to improve her balance, posture; improve pain    Currently in Pain? Yes    Pain Score 2     Pain Location Neck    Pain Orientation Posterior;Left    Pain Descriptors / Indicators Sore;Tightness    Pain Type Chronic pain    Pain Onset More than a month ago    Pain Relieving Factors Tylenol, getting in a position where her neck isn't pulling                [facility] PT Assessment - 03/25/21 1108       High Level Balance   High Level Balance Comments push and release test: forward= 1 small delayed step, posterior = 3 small shuffled steps, lateral = no step bilat. SLS = 1.78 LLE, 1.38 RLE                           [facility] Adult PT Treatment/Exercise - 03/25/21 1118       Transfers   Transfers Sit to Stand;Stand to [employer]  Sit to Stand 5: Supervision;With upper extremity assist;Without upper extremity assist    Stand to Sit 5: Supervision;With upper extremity assist;Without upper extremity assist    Number of Reps 1 set   5 reps, plus an additional x4 reps   Transfer Cueing Cued to scoot out towards edge, feet hip width and tucked under, and nose over toes for incr forward weight shift to stand. Cues for posture in standing.    Comments Pt reporting feeling breathless after 5 reps, O2 at 99% HR at 86 bpm afterwards with cues for pursed lip breathing. Performed an additional x4 reps with cued to exhale when coming to stand and taking a couple inhales/exhales in standing before sitting back down, pt reporting this helping with her breathing. Added to pt's HEP      Neuro Re-ed    Neuro Re-ed Details  Alternating step taps to bottom of cabinet: x10 reps each leg with fingertip support, pt reporting feel SOB  after, O2 97%, HR 91 bpm with cues for pursed lip breathing and pt needing a seated rest break/drinking water. Lateral stepping strategy with cues for foot clearance/weight shift. x4 reps each side, O2 after 95%, HR 91 bpm, O2 incr to 98% with cues for pursed lip breathing             Access Code: [email or web address] URL: [email or web address] Date: 03/25/2021 Prepared by: [name]  Briefly reviewed bolded exercises at end of session upon pt request.   Exercises Seated Upper Trapezius Stretch - 2 x daily - 7 x weekly - 1 sets - 3 reps - 30 sec hold -reminder cues for technique and hold time, gently using R hand to assist with stretch  Seated Scapular Retraction - 2 x daily - 7 x weekly - 1 sets - 10 reps - 5 sec hold - cues on how long to hold position Seated Cervical Rotation AROM - 2 x daily - 7 x weekly - 1-2 sets - 10 reps - cued to sit with tallest posture and to turn head only vs. Turning shoulders, pt with incr limitations turning head to R  Supine Cervical Retraction with Towel - 2 x daily - 7 x weekly - 1 sets - 10 reps - 3 hold Supine Scapular Retraction - 2 x daily - 7 x weekly - 1-2 sets - 10 reps  New additions on 03/25/21:  Sit to Stand with Armchair - 1-2 x daily - 5 x weekly - 2 sets - 4 reps Alternating Step Taps with Counter Support - 1-2 x daily - 5 x weekly - 2 sets - 5 reps        PT Education - 03/25/21 1153     Education Details Added in sit <> stands, SLS taps at countertop to HEP for strength/balance. What PT will work on in regards to posturing/cervical ROM/neck pain likely due to pt's Parkinsonism    Person(s) Educated Patient    Methods Explanation;Demonstration;Verbal cues;Handout    Comprehension Verbalized understanding;Returned demonstration              PT Short Term Goals - 03/25/21 1203       PT SHORT TERM GOAL #1   Title Pt will be independent in initial HEP with husband supervision in order to build upon functional gains  made in therapy. ALL STGS DUE 03/20/21    Time 4    Period Weeks    Status New    Target Date 03/30/21      PT SHORT  TERM GOAL #2   Title Cervical ROM to be assessed with STG/LTG written as appropriate.    Time 4    Period Weeks    Status Achieved      PT SHORT TERM GOAL #3   Title Pt will decr TUG time to 17 seconds or less in order to demo decr fall risk.    Baseline 22.72 seconds    Time 4    Period Weeks      PT SHORT TERM GOAL #4   Title Will perform push and release test with LTG written in order to demo improved balance reactions.    Time 4    Period Weeks    Status Achieved      PT SHORT TERM GOAL #5   Title Pt will improve gait speed to at least 1.5 ft/sec with no AD vs. LRAD in order to demo decr fall risk.    Baseline 1.09 ft/sec with no AD    Time 4    Period Weeks    Status New      PT SHORT TERM GOAL #6   Title improve cervical Rt sidebending to at least 15 deg for improved mobility    Time 3    Period Weeks    Status New    Target Date 03/30/21               PT Long Term Goals - 03/25/21 1203       PT LONG TERM GOAL #1   Title Pt will be independent in final HEP with husband supervision in order to build upon functional gains made in therapy. ALL LTGS DUE 04/27/21    Time 8    Period Weeks    Status New    Target Date 04/27/21      PT LONG TERM GOAL #2   Title Pt will recover posterior and anterior balance in push and release test 1 robust step independently, for improved balance recovery    Baseline anterior = 1 small delayed step, posterior = 3 small shuffled steps    Time 8    Period Weeks    Status Revised      PT LONG TERM GOAL #3   Title Pt will improve gait speed to at least 2.0 ft/sec with no AD vs. LRAD in order to demo decr fall risk.    Baseline 1.09 ft/sec no AD    Time 8    Period Weeks    Status New      PT LONG TERM GOAL #4   Title Pt will improve 5x sit <> stand to at least 16 seconds or less with no UE support in order  to demo improved functional BLE strength.    Baseline 20.71 seconds, no UE support    Time 8    Period Weeks    Status New      PT LONG TERM GOAL #5   Title Pt will ambulate at least 500' over indoor and outdoor unlevel surfaces with supervision with no AD vs. LRAD in order to demo improved community mobility.    Time 8    Period Weeks    Status New      PT LONG TERM GOAL #6   Title report neck pain < 4/10 with activity for improved function    Status New      PT LONG TERM GOAL #7   Title demonstrate improved postural awareness and decreased episodes of cervical flexion when up amb  at least 25% of the time    Status New                   Plan - 03/25/21 1204     Clinical Impression Statement Further assessed pt's balance today with pt with deficits in SLS bilat, and stepping strategies. Pt taking 1 delayed forward step, 3 small steps posterior, and unable to ellict a step in the lateral direction. Updated LTG as appropriate. Worked on adding in sit <> stands and standing SLS taps to pt's HEP today. Pt getting SOB with standing activity today and needing rest breaks with cues for pursed lip breathing (vitals WNL). Pt has seen her pulmonologist rearding this. Will continue to progress towards LTGs.    Personal Factors and Comorbidities Comorbidity 3+;Past/Current Experience;Time since onset of injury/illness/exacerbation    Comorbidities PMH: Parkinsonism, anxiety, HLD, HTN, pacemaker, syncope with collapse resulting in SAH/TBI 2016, aphasia/stuttering    Examination-Activity Limitations Transfers;Stairs;Stand;Locomotion Level    Examination-Participation Restrictions Community Activity;Cleaning;Laundry;Driving    Stability/Clinical Decision Making Evolving/Moderate complexity    Rehab Potential Fair   due to deficits   PT Frequency 2x / week    PT Duration 12 weeks    PT Treatment/Interventions ADLs/Self Care Home Management;Moist Heat;DME Instruction;Gait training;Stair  training;Functional mobility training;Therapeutic activities;Neuromuscular re-education;Balance training;Therapeutic exercise;Patient/family education;Manual techniques;Passive range of motion;Dry needling;Vestibular;Cryotherapy;Electrical Stimulation;Traction;Ultrasound;Taping    PT Next Visit Plan review cervical HEP and update PRN. How did sit <>stands/step taps go at home?  Continue to work on postural/cervical stretching and ROM. Work on functional strengthening, SLS, and stepping strategies. Monitor O2 as pt gets SOB with standing activities.    PT Home Exercise Plan Access Code: [email or web address]    Consulted and Agree with Plan of Care Patient;Family member/caregiver             Patient will benefit from skilled therapeutic intervention in order to improve the following deficits and impairments:  Abnormal gait, Decreased activity tolerance, Decreased coordination, Decreased balance, Decreased endurance, Decreased range of motion, Decreased strength, Difficulty walking, Impaired flexibility, Increased muscle spasms, Impaired sensation, Postural dysfunction, Pain, Decreased mobility, Decreased knowledge of use of DME, Impaired tone, Increased fascial restricitons, Impaired UE functional use  Visit Diagnosis: Abnormal posture  Cervicalgia  Muscle weakness (generalized)  Other abnormalities of gait and mobility  Unsteadiness on feet     Problem List Patient Active Problem List   Diagnosis Date Noted   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[place]) 09/07/2018   Functional dyspepsia 09/08/2017   Slow transit constipation 09/08/2017   S/P placement of cardiac pacemaker    Mobitz II 02/20/2016   Nystagmus, end-position 02/12/2016   Dizziness and giddiness 02/12/2016   Flapping tremor 02/12/2016   Chronic fatigue 10/28/2015   PCP NOTES >>>> 02/13/2015   TBI (traumatic brain injury) 01/28/2015   Essential hypertension    Acute post-traumatic headache, not intractable    Skull fracture (HCC)    Vertigo  due to brain injury    SAH (subarachnoid hemorrhage) ([place]) 01/21/2015   Annual physical exam 09/07/2014   Orthostatic hypotension 08/30/2014   Acromioclavicular arthrosis 03/21/2014   Allergic rhinitis 02/27/2014   SI (sacroiliac) joint dysfunction 02/02/2014   Gastrocnemius strain, left 12/11/2013   Back pain 08/22/2013   LBBB (left bundle branch block) 05/13/2013   Shingles outbreak 12/15/2012   Borderline diabetes    Insomnia 01/12/2008   HLD (hyperlipidemia) 02/04/2007   Anxiety 10/12/2006   Osteoarthritis 10/12/2006    Arliss Journey, PT, DPT 03/25/2021, 12:24 PM  [facility] [address] [place], [place], [address] Phone: [phone]   Fax:  [phone]  Name: Mandy Durham MRN: [id number] Date of Birth: Mar 24, 1945

## 2021-03-26 ENCOUNTER — Ambulatory Visit: Payer: PPO | Admitting: Physician Assistant

## 2021-03-26 ENCOUNTER — Encounter: Payer: Self-pay | Admitting: Physician Assistant

## 2021-03-26 VITALS — BP 138/68 | HR 90 | Ht 65.0 in | Wt 107.0 lb

## 2021-03-26 DIAGNOSIS — Z79899 Other long term (current) drug therapy: Secondary | ICD-10-CM | POA: Diagnosis not present

## 2021-03-26 DIAGNOSIS — Z950 Presence of cardiac pacemaker: Secondary | ICD-10-CM

## 2021-03-26 DIAGNOSIS — I5032 Chronic diastolic (congestive) heart failure: Secondary | ICD-10-CM

## 2021-03-26 DIAGNOSIS — I10 Essential (primary) hypertension: Secondary | ICD-10-CM | POA: Diagnosis not present

## 2021-03-26 LAB — CUP PACEART INCLINIC DEVICE CHECK
Battery Remaining Longevity: 62 mo
Battery Voltage: 3 V
Brady Statistic AP VP Percent: 0.05 %
Brady Statistic AP VS Percent: 0.45 %
Brady Statistic AS VP Percent: 9.68 %
Brady Statistic AS VS Percent: 89.82 %
Brady Statistic RA Percent Paced: 0.5 %
Brady Statistic RV Percent Paced: 10.5 %
Date Time Interrogation Session: 20230111173410
Implantable Lead Implant Date: 20171208
Implantable Lead Implant Date: 20171208
Implantable Lead Location: 753859
Implantable Lead Location: 753860
Implantable Lead Model: 5076
Implantable Lead Model: 5076
Implantable Pulse Generator Implant Date: 20171208
Lead Channel Impedance Value: 342 Ohm
Lead Channel Impedance Value: 418 Ohm
Lead Channel Impedance Value: 437 Ohm
Lead Channel Impedance Value: 551 Ohm
Lead Channel Pacing Threshold Amplitude: 0.5 V
Lead Channel Pacing Threshold Amplitude: 0.5 V
Lead Channel Pacing Threshold Pulse Width: 0.4 ms
Lead Channel Pacing Threshold Pulse Width: 0.4 ms
Lead Channel Sensing Intrinsic Amplitude: 14.375 mV
Lead Channel Sensing Intrinsic Amplitude: 2.375 mV
Lead Channel Sensing Intrinsic Amplitude: 2.5 mV
Lead Channel Sensing Intrinsic Amplitude: 20.625 mV
Lead Channel Setting Pacing Amplitude: 2 V
Lead Channel Setting Pacing Amplitude: 2.5 V
Lead Channel Setting Pacing Pulse Width: 0.4 ms
Lead Channel Setting Sensing Sensitivity: 4 mV

## 2021-03-26 MED ORDER — FUROSEMIDE 20 MG PO TABS: 20.0000 mg | ORAL_TABLET | Freq: Every day | ORAL | 3 refills | Status: DC

## 2021-03-26 NOTE — Patient Instructions (Signed)
Medication Instructions:    TAKE FUROSEMIDE  20 MG  ONCE A DAY ALTERNATING 40 MG ON OTHER DAYS  READ EXAMPLE BELOW :  Monday : 20 mg  Tuesday : 40 mg  Wednesday : 20 mg  Thursday : 40 mg  Friday:20 mg  Saturday: 40 mg  Sunday: 20 mg   *If you need a refill on your cardiac medications before your next appointment, please call your pharmacy*   Lab Work:  BMET  IN 2 [name]    If you have labs (blood work) drawn today and your tests are completely normal, you will receive your results only by: [employer] (if you have MyChart) OR A paper copy in the mail If you have any lab test that is abnormal or we need to change your treatment, we will call you to review the results.   Testing/Procedures:  NONE ORDERED  TODAY     Follow-Up: At [facility], you and your health needs are our priority.  As part of our continuing mission to provide you with exceptional heart care, we have created designated Provider Care Teams.  These Care Teams include your primary Cardiologist (physician) and Advanced Practice Providers (APPs -  Physician Assistants and Nurse Practitioners) who all work together to provide you with the care you need, when you need it.  We recommend signing up for the patient portal called "MyChart".  Sign up information is provided on this After Visit Summary.  MyChart is used to connect with patients for Virtual Visits (Telemedicine).  Patients are able to view lab/test results, encounter notes, upcoming appointments, etc.  Non-urgent messages can be sent to your provider as well.   To learn more about what you can do with MyChart, go to [email or web address].    Your next appointment:   3 month(s)  The format for your next appointment:   In Person  Provider:   You may see Will Meredith Leeds, MD or one of the following Advanced Practice Providers on your designated Care Team:   Tommye Standard, [place] Legrand Como "[facility]" [place], [place]

## 2021-03-27 ENCOUNTER — Encounter: Payer: Self-pay | Admitting: Physical Therapy

## 2021-03-27 ENCOUNTER — Ambulatory Visit: Payer: PPO | Admitting: Physical Therapy

## 2021-03-27 ENCOUNTER — Other Ambulatory Visit: Payer: Self-pay

## 2021-03-27 DIAGNOSIS — M542 Cervicalgia: Secondary | ICD-10-CM

## 2021-03-27 DIAGNOSIS — R293 Abnormal posture: Secondary | ICD-10-CM | POA: Diagnosis not present

## 2021-03-27 DIAGNOSIS — M6281 Muscle weakness (generalized): Secondary | ICD-10-CM

## 2021-03-27 DIAGNOSIS — R2681 Unsteadiness on feet: Secondary | ICD-10-CM

## 2021-03-27 DIAGNOSIS — R2689 Other abnormalities of gait and mobility: Secondary | ICD-10-CM

## 2021-03-27 NOTE — Therapy (Signed)
[facility] [address] [place], [place], [address] Phone: [phone]   Fax:  [phone]  Physical Therapy Treatment  Patient Details  Name: Mandy Durham MRN: [id number] Date of Birth: Jul 15, 1945 Referring Provider (PT): [name], [name], MD   Encounter Date: 03/27/2021   PT End of Session - 03/27/21 2005     Visit Number 5    Number of Visits 25    Date for PT Re-Evaluation 05/29/21    Authorization Type [employer]    PT Start Time 1406    PT Stop Time 1448    PT Time Calculation (min) 42 min    Activity Tolerance Patient tolerated treatment well    Behavior During Therapy [facility] for tasks assessed/performed             Past Medical History:  Diagnosis Date   Anxiety state, unspecified    Hyperlipidemia    Hypertension    Insomnia    LBBB (left bundle branch block)    LHC in 2002 showed normal coronaries.    Osteoarthrosis, unspecified whether generalized or localized, unspecified site    Overweight(278.02)    S/P placement of cardiac pacemaker    a. 02/21/16: Medtronic Advisa DR MRI SureScan model [id number] (serial number [id number])    Serrated adenoma of colon 2007   Symptomatic menopausal or female climacteric states    Syncope and collapse    11/12: Holter 12/12 with rare PACS, HR range 64-140, average 82, no significant arrhythmias. Echo (1/13): EF 50-55%, mild LVH, septal-lateral dyssynchrony c/w LBBB. 3-week event monitor (1/13): No significant arrhythmia.     Past Surgical History:  Procedure Laterality Date   COLONOSCOPY  2007   EP IMPLANTABLE DEVICE N/A 02/21/2016   Procedure: Pacemaker Implant;  Surgeon: Will Meredith Leeds, MD;  Location: [facility];  Service: Cardiovascular;  Laterality: N/A;   POLYPECTOMY      There were no vitals filed for this visit.   Subjective Assessment - 03/27/21 1409     Subjective Pt has been doing exercises; had one question about  upper trap stretch.    Pertinent History PMH: Parkinsonism anxiety, HLD, HTN, pacemaker, hx of TBI (2016)    Limitations Walking;House hold activities;Standing    Patient Stated Goals Wants to improve her balance, posture; improve pain    Currently in Pain? Yes    Pain Score 1     Pain Location Neck    Pain Orientation Posterior    Pain Onset More than a month ago              [facility] Adult PT Treatment/Exercise - 03/27/21 1412       Balance   Balance Assessed Yes      Dynamic Sitting Balance   Dynamic Sitting - Balance Support Left upper extremity supported;Feet supported   R hip wedged for trunk shortening   Dynamic Sitting - Level of Assistance 4: Min assist    Dynamic Sitting - Balance Activities Lateral lean/weight shifting;Head control activities    Sitting balance - Comments with R hip wedged, supported LUE on therapist's knee, performed trunk excursions and thoracic elongation on L to facilitate head righting to midline x 8 reps with therapist providing facilitation at head and ribs      Exercises   Exercises Neck      Neck Exercises: Seated   Cervical Isometrics Extension;Right lateral flexion;5 secs;5 reps    Cervical Isometrics Limitations combined extension and R  lateral flexion against therapist's manual resistance and maintaining x 5-6 seconds      Neck Exercises: Supine   Cervical Isometrics Extension;Right lateral flexion;5 secs;10 reps    Cervical Isometrics Limitations with therapist providing manual resistance    Other Supine Exercise Combined isometric neck extension with posterior hip extension with bridges x 8 reps with 5 second hold.      Manual Therapy   Manual Therapy Soft tissue mobilization;Passive ROM    Manual therapy comments Performed in supine to L side    Soft tissue mobilization STM to L upper trapezius muscles in combination with PROM into R lateral flexion    Passive ROM Performed lateral flexion to R with therapist providing overpressure  to L shoulder into depression for increased stretch/ROM              PT Short Term Goals - 03/25/21 1203       PT SHORT TERM GOAL #1   Title Pt will be independent in initial HEP with husband supervision in order to build upon functional gains made in therapy. ALL STGS DUE 03/20/21    Time 4    Period Weeks    Status New    Target Date 03/30/21      PT SHORT TERM GOAL #2   Title Cervical ROM to be assessed with STG/LTG written as appropriate.    Time 4    Period Weeks    Status Achieved      PT SHORT TERM GOAL #3   Title Pt will decr TUG time to 17 seconds or less in order to demo decr fall risk.    Baseline 22.72 seconds    Time 4    Period Weeks      PT SHORT TERM GOAL #4   Title Will perform push and release test with LTG written in order to demo improved balance reactions.    Time 4    Period Weeks    Status Achieved      PT SHORT TERM GOAL #5   Title Pt will improve gait speed to at least 1.5 ft/sec with no AD vs. LRAD in order to demo decr fall risk.    Baseline 1.09 ft/sec with no AD    Time 4    Period Weeks    Status New      PT SHORT TERM GOAL #6   Title improve cervical Rt sidebending to at least 15 deg for improved mobility    Time 3    Period Weeks    Status New    Target Date 03/30/21               PT Long Term Goals - 03/25/21 1203       PT LONG TERM GOAL #1   Title Pt will be independent in final HEP with husband supervision in order to build upon functional gains made in therapy. ALL LTGS DUE 04/27/21    Time 8    Period Weeks    Status New    Target Date 04/27/21      PT LONG TERM GOAL #2   Title Pt will recover posterior and anterior balance in push and release test 1 robust step independently, for improved balance recovery    Baseline anterior = 1 small delayed step, posterior = 3 small shuffled steps    Time 8    Period Weeks    Status Revised      PT LONG TERM GOAL #3  Title Pt will improve gait speed to at least 2.0  ft/sec with no AD vs. LRAD in order to demo decr fall risk.    Baseline 1.09 ft/sec no AD    Time 8    Period Weeks    Status New      PT LONG TERM GOAL #4   Title Pt will improve 5x sit <> stand to at least 16 seconds or less with no UE support in order to demo improved functional BLE strength.    Baseline 20.71 seconds, no UE support    Time 8    Period Weeks    Status New      PT LONG TERM GOAL #5   Title Pt will ambulate at least 500' over indoor and outdoor unlevel surfaces with supervision with no AD vs. LRAD in order to demo improved community mobility.    Time 8    Period Weeks    Status New      PT LONG TERM GOAL #6   Title report neck pain < 4/10 with activity for improved function    Status New      PT LONG TERM GOAL #7   Title demonstrate improved postural awareness and decreased episodes of cervical flexion when up amb at least 25% of the time    Status New                   Plan - 03/27/21 2007     Clinical Impression Statement Continued to address postural abnormalities with supine and sidelying isometric and concentric strengthening exercises.  Also performed seated postural control exercises to facilitate head righting in midline.  Pt continues to demonstrate SOB with activity and increased use of accessory muscles for inspiration.  Will continue to address and progress towards LTG.    Personal Factors and Comorbidities Comorbidity 3+;Past/Current Experience;Time since onset of injury/illness/exacerbation    Comorbidities PMH: Parkinsonism, anxiety, HLD, HTN, pacemaker, syncope with collapse resulting in SAH/TBI 2016, aphasia/stuttering    Examination-Activity Limitations Transfers;Stairs;Stand;Locomotion Level    Examination-Participation Restrictions Community Activity;Cleaning;Laundry;Driving    Stability/Clinical Decision Making Evolving/Moderate complexity    Rehab Potential Fair   due to deficits   PT Frequency 2x / week    PT Duration 12 weeks     PT Treatment/Interventions ADLs/Self Care Home Management;Moist Heat;DME Instruction;Gait training;Stair training;Functional mobility training;Therapeutic activities;Neuromuscular re-education;Balance training;Therapeutic exercise;Patient/family education;Manual techniques;Passive range of motion;Dry needling;Vestibular;Cryotherapy;Electrical Stimulation;Traction;Ultrasound;Taping    PT Next Visit Plan Check STG.  Supine isometric neck exercises > sidelying concentric exercises against gravity > R hip wedged and seated trunk excursion to L for head righting.  Continue to work on postural/cervical stretching and ROM. Work on functional strengthening, SLS, and stepping strategies. Monitor O2 as pt gets SOB with standing activities.    PT Home Exercise Plan Access Code: [email or web address]    Consulted and Agree with Plan of Care Patient;Family member/caregiver             Patient will benefit from skilled therapeutic intervention in order to improve the following deficits and impairments:  Abnormal gait, Decreased activity tolerance, Decreased coordination, Decreased balance, Decreased endurance, Decreased range of motion, Decreased strength, Difficulty walking, Impaired flexibility, Increased muscle spasms, Impaired sensation, Postural dysfunction, Pain, Decreased mobility, Decreased knowledge of use of DME, Impaired tone, Increased fascial restricitons, Impaired UE functional use  Visit Diagnosis: Abnormal posture  Cervicalgia  Muscle weakness (generalized)  Other abnormalities of gait and mobility  Unsteadiness on feet     Problem List  Patient Active Problem List   Diagnosis Date Noted   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[place]) 09/07/2018   Functional dyspepsia 09/08/2017   Slow transit constipation 09/08/2017   S/P placement of cardiac pacemaker    Mobitz II 02/20/2016   Nystagmus, end-position 02/12/2016   Dizziness and giddiness 02/12/2016   Flapping tremor 02/12/2016   Chronic fatigue  10/28/2015   PCP NOTES >>>> 02/13/2015   TBI (traumatic brain injury) 01/28/2015   Essential hypertension    Acute post-traumatic headache, not intractable    Skull fracture (HCC)    Vertigo due to brain injury    SAH (subarachnoid hemorrhage) ([place]) 01/21/2015   Annual physical exam 09/07/2014   Orthostatic hypotension 08/30/2014   Acromioclavicular arthrosis 03/21/2014   Allergic rhinitis 02/27/2014   SI (sacroiliac) joint dysfunction 02/02/2014   Gastrocnemius strain, left 12/11/2013   Back pain 08/22/2013   LBBB (left bundle branch block) 05/13/2013   Shingles outbreak 12/15/2012   Borderline diabetes    Insomnia 01/12/2008   HLD (hyperlipidemia) 02/04/2007   Anxiety 10/12/2006   Osteoarthritis 10/12/2006    Rico Junker, PT, DPT 03/27/21    8:27 PM  [facility] [address] [place], [place], [address] Phone: [phone]   Fax:  [phone]  Name: Mandy Durham MRN: [id number] Date of Birth: 1946/02/16

## 2021-04-01 ENCOUNTER — Ambulatory Visit: Payer: PPO | Admitting: Physical Therapy

## 2021-04-01 ENCOUNTER — Other Ambulatory Visit: Payer: Self-pay

## 2021-04-01 ENCOUNTER — Encounter: Payer: Self-pay | Admitting: Physical Therapy

## 2021-04-01 DIAGNOSIS — R293 Abnormal posture: Secondary | ICD-10-CM | POA: Diagnosis not present

## 2021-04-01 DIAGNOSIS — R2689 Other abnormalities of gait and mobility: Secondary | ICD-10-CM

## 2021-04-01 DIAGNOSIS — M542 Cervicalgia: Secondary | ICD-10-CM

## 2021-04-01 DIAGNOSIS — M6281 Muscle weakness (generalized): Secondary | ICD-10-CM

## 2021-04-01 NOTE — Therapy (Addendum)
[facility] [address] [place], [place], [address] Phone: [phone]   Fax:  [phone]  Physical Therapy Treatment  Patient Details  Name: Mandy Durham MRN: [id number] Date of Birth: March 12, 1946 Referring Provider (PT): [name], [name], MD   Encounter Date: 04/01/2021   PT End of Session - 04/01/21 1322     Visit Number 6    Number of Visits 25    Date for PT Re-Evaluation 05/29/21    Authorization Type [employer]    PT Start Time 1314    PT Stop Time 1358    PT Time Calculation (min) 44 min    Activity Tolerance Patient tolerated treatment well    Behavior During Therapy [facility] for tasks assessed/performed             Past Medical History:  Diagnosis Date   Anxiety state, unspecified    Hyperlipidemia    Hypertension    Insomnia    LBBB (left bundle branch block)    LHC in 2002 showed normal coronaries.    Osteoarthrosis, unspecified whether generalized or localized, unspecified site    Overweight(278.02)    S/P placement of cardiac pacemaker    a. 02/21/16: Medtronic Advisa DR MRI SureScan model [id number] (serial number [id number])    Serrated adenoma of colon 2007   Symptomatic menopausal or female climacteric states    Syncope and collapse    11/12: Holter 12/12 with rare PACS, HR range 64-140, average 82, no significant arrhythmias. Echo (1/13): EF 50-55%, mild LVH, septal-lateral dyssynchrony c/w LBBB. 3-week event monitor (1/13): No significant arrhythmia.     Past Surgical History:  Procedure Laterality Date   COLONOSCOPY  2007   EP IMPLANTABLE DEVICE N/A 02/21/2016   Procedure: Pacemaker Implant;  Surgeon: Will Meredith Leeds, MD;  Location: [facility];  Service: Cardiovascular;  Laterality: N/A;   POLYPECTOMY      There were no vitals filed for this visit.   Subjective Assessment - 04/01/21 1318     Subjective No changes. Pt reports she has questions about her  exercises, but then forgets them when she gets here. Drove by herself to appt today, husband at home.    Pertinent History PMH: Parkinsonism anxiety, HLD, HTN, pacemaker, hx of TBI (2016)    Limitations Walking;House hold activities;Standing    Patient Stated Goals Wants to improve her balance, posture; improve pain    Currently in Pain? Yes    Pain Score 2     Pain Location Neck    Pain Orientation Posterior    Pain Descriptors / Indicators Sore;Tightness    Pain Type Chronic pain    Pain Onset More than a month ago    Aggravating Factors  nothing                               [facility] Adult PT Treatment/Exercise - 04/01/21 1322       Ambulation/Gait   Ambulation/Gait Yes    Ambulation/Gait Assistance 4: Min guard;5: Supervision    Ambulation Distance (Feet) --   short distances in clinic   Assistive device None    Gait Pattern Step-through pattern;Decreased arm swing - right;Decreased arm swing - left;Decreased hip/knee flexion - right;Decreased hip/knee flexion - left;Decreased dorsiflexion - right;Decreased dorsiflexion - left;Decreased trunk rotation;Trunk flexed    Ambulation Surface Level;Indoor    Gait velocity 14.15 seconds = 2.32 ft/sec  Self-Care   Self-Care Other Self-Care Comments    Other Self-Care Comments  Pt reporting that she is driving herself to therapy appts, discussed safety concerns with driving and that pt should have her husband to be driving her to sessions. Discussed also have pt's husband present for a few sessions to review HEP for incr carryover for home as pt reports that she forgots how to perform her exercises at home. Attempted to perform diaphragmatic breathing in supine to help limit accessory m. to assist, verbal/tactile cues for technique, pt with incr difficulty and still performing with incr accessory muscles to assist      Exercises   Exercises Neck      Neck Exercises: Supine   Cervical Isometrics Extension;Right  lateral flexion;5 secs;10 reps   10 reps of each   Cervical Isometrics Limitations therapist providing manual resistance, head in midline after L upper trap stretch    Cervical Rotation Right;Limitations;15 reps    Cervical Rotation Limitations AROM to R<> midline, holding for a couple seconds at end range for an incr stretch      Neck Exercises: Sidelying   Lateral Flexion Right;Limitations    Lateral Flexion Limitations In L sideying, x10 reps isometric R lateral flexion, initial tactile/verbal cues      Neck Exercises: Stretches   Upper Trapezius Stretch Left;3 reps;30 seconds    Upper Trapezius Stretch Limitations with muscle energy techique for shoulder depression                     PT Education - 04/01/21 1636     Education Details Progress towards goals, see self care    Person(s) Educated Patient    Methods Explanation    Comprehension Verbalized understanding;Need further instruction              PT Short Term Goals - 04/01/21 1352       PT SHORT TERM GOAL #1   Title Pt will be independent in initial HEP with husband supervision in order to build upon functional gains made in therapy. ALL STGS DUE 03/20/21    Time 4    Period Weeks    Status New    Target Date 03/30/21      PT SHORT TERM GOAL #2   Title Cervical ROM to be assessed with STG/LTG written as appropriate.    Time 4    Period Weeks    Status Achieved      PT SHORT TERM GOAL #3   Title Pt will decr TUG time to 17 seconds or less in order to demo decr fall risk.    Baseline 22.72 seconds; 13.93 seconds on 04/01/21    Time 4    Period Weeks    Status Achieved      PT SHORT TERM GOAL #4   Title Will perform push and release test with LTG written in order to demo improved balance reactions.    Time 4    Period Weeks    Status Achieved      PT SHORT TERM GOAL #5   Title Pt will improve gait speed to at least 1.5 ft/sec with no AD vs. LRAD in order to demo decr fall risk.    Baseline  1.09 ft/sec with no AD; 2.32 ft/sec on 04/01/21    Time 4    Period Weeks    Status Achieved      PT SHORT TERM GOAL #6   Title improve cervical Rt sidebending to at  least 15 deg for improved mobility    Time 3    Period Weeks    Status New    Target Date 03/30/21               PT Long Term Goals - 03/25/21 1203       PT LONG TERM GOAL #1   Title Pt will be independent in final HEP with husband supervision in order to build upon functional gains made in therapy. ALL LTGS DUE 04/27/21    Time 8    Period Weeks    Status New    Target Date 04/27/21      PT LONG TERM GOAL #2   Title Pt will recover posterior and anterior balance in push and release test 1 robust step independently, for improved balance recovery    Baseline anterior = 1 small delayed step, posterior = 3 small shuffled steps    Time 8    Period Weeks    Status Revised      PT LONG TERM GOAL #3   Title Pt will improve gait speed to at least 2.0 ft/sec with no AD vs. LRAD in order to demo decr fall risk.    Baseline 1.09 ft/sec no AD    Time 8    Period Weeks    Status New      PT LONG TERM GOAL #4   Title Pt will improve 5x sit <> stand to at least 16 seconds or less with no UE support in order to demo improved functional BLE strength.    Baseline 20.71 seconds, no UE support    Time 8    Period Weeks    Status New      PT LONG TERM GOAL #5   Title Pt will ambulate at least 500' over indoor and outdoor unlevel surfaces with supervision with no AD vs. LRAD in order to demo improved community mobility.    Time 8    Period Weeks    Status New      PT LONG TERM GOAL #6   Title report neck pain < 4/10 with activity for improved function    Status New      PT LONG TERM GOAL #7   Title demonstrate improved postural awareness and decreased episodes of cervical flexion when up amb at least 25% of the time    Status New                   Plan - 04/01/21 1637     Clinical Impression Statement  Began to check pt's STGs. Pt with signficant improvement of TUG and gait speed since when initially assessed at eval. Performed TUG in 13.93 seconds (previously 22.72 seconds). Pt's gait speed was 2.32 ft/sec (previously 1.09 ft/sec) with no AD. Attempted to perform diaphraghmatic breathing today in supine to help decr accessory muscles of breathing, however pt was not able to isolate just using her diaphragm and continued with using accessory muscles. Remainder of session continued to focus on postural abnormalities/cervical ROM in supine and sidelying positions. Will continue to progress towards LTGs.    Personal Factors and Comorbidities Comorbidity 3+;Past/Current Experience;Time since onset of injury/illness/exacerbation    Comorbidities PMH: Parkinsonism, anxiety, HLD, HTN, pacemaker, syncope with collapse resulting in SAH/TBI 2016, aphasia/stuttering    Examination-Activity Limitations Transfers;Stairs;Stand;Locomotion Level    Examination-Participation Restrictions Community Activity;Cleaning;Laundry;Driving    Stability/Clinical Decision Making Evolving/Moderate complexity    Rehab Potential Fair   due to deficits  PT Frequency 2x / week    PT Duration 12 weeks    PT Treatment/Interventions ADLs/Self Care Home Management;Moist Heat;DME Instruction;Gait training;Stair training;Functional mobility training;Therapeutic activities;Neuromuscular re-education;Balance training;Therapeutic exercise;Patient/family education;Manual techniques;Passive range of motion;Dry needling;Vestibular;Cryotherapy;Electrical Stimulation;Traction;Ultrasound;Taping    PT Next Visit Plan check last STG. Supine isometric neck exercises > sidelying concentric exercises against gravity > R hip wedged and seated trunk excursion to L for head righting.  Continue to work on postural/cervical stretching and ROM. Work on functional strengthening, SLS, and stepping strategies. diapraghmatic breathing on wedge?  Monitor O2 as pt  gets SOB with standing activities.    PT Home Exercise Plan Access Code: [email or web address]    Consulted and Agree with Plan of Care Patient;Family member/caregiver             Patient will benefit from skilled therapeutic intervention in order to improve the following deficits and impairments:  Abnormal gait, Decreased activity tolerance, Decreased coordination, Decreased balance, Decreased endurance, Decreased range of motion, Decreased strength, Difficulty walking, Impaired flexibility, Increased muscle spasms, Impaired sensation, Postural dysfunction, Pain, Decreased mobility, Decreased knowledge of use of DME, Impaired tone, Increased fascial restricitons, Impaired UE functional use  Visit Diagnosis: Abnormal posture  Cervicalgia  Muscle weakness (generalized)  Other abnormalities of gait and mobility     Problem List Patient Active Problem List   Diagnosis Date Noted   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[place]) 09/07/2018   Functional dyspepsia 09/08/2017   Slow transit constipation 09/08/2017   S/P placement of cardiac pacemaker    Mobitz II 02/20/2016   Nystagmus, end-position 02/12/2016   Dizziness and giddiness 02/12/2016   Flapping tremor 02/12/2016   Chronic fatigue 10/28/2015   PCP NOTES >>>> 02/13/2015   TBI (traumatic brain injury) 01/28/2015   Essential hypertension    Acute post-traumatic headache, not intractable    Skull fracture (HCC)    Vertigo due to brain injury    SAH (subarachnoid hemorrhage) ([place]) 01/21/2015   Annual physical exam 09/07/2014   Orthostatic hypotension 08/30/2014   Acromioclavicular arthrosis 03/21/2014   Allergic rhinitis 02/27/2014   SI (sacroiliac) joint dysfunction 02/02/2014   Gastrocnemius strain, left 12/11/2013   Back pain 08/22/2013   LBBB (left bundle branch block) 05/13/2013   Shingles outbreak 12/15/2012   Borderline diabetes    Insomnia 01/12/2008   HLD (hyperlipidemia) 02/04/2007   Anxiety 10/12/2006   Osteoarthritis 10/12/2006     Arliss Journey, PT,DPT 04/01/2021, 4:39 PM  [facility] [address] [place], [place], [address] Phone: [phone]   Fax:  [phone]  Name: Mandy Durham MRN: [id number] Date of Birth: Apr 04, 1945

## 2021-04-03 ENCOUNTER — Ambulatory Visit: Payer: PPO | Admitting: Physical Therapy

## 2021-04-03 ENCOUNTER — Other Ambulatory Visit: Payer: Self-pay

## 2021-04-03 DIAGNOSIS — M6281 Muscle weakness (generalized): Secondary | ICD-10-CM

## 2021-04-03 DIAGNOSIS — R293 Abnormal posture: Secondary | ICD-10-CM | POA: Diagnosis not present

## 2021-04-03 DIAGNOSIS — M542 Cervicalgia: Secondary | ICD-10-CM

## 2021-04-03 DIAGNOSIS — R2689 Other abnormalities of gait and mobility: Secondary | ICD-10-CM

## 2021-04-03 NOTE — Patient Instructions (Signed)
Access Code: [email or web address] URL: [email or web address] Date: 04/03/2021 Prepared by: Misty Stanley  Exercises Seated Upper Trapezius Stretch - 2 x daily - 7 x weekly - 1 sets - 3 reps - 30 sec hold Supine Cervical Retraction with Towel - 2 x daily - 7 x weekly - 1 sets - 10 reps - 3 hold Supine Scapular Retraction - 2 x daily - 7 x weekly - 1-2 sets - 10 reps Supine Bridge - 1 x daily - 7 x weekly - 1 sets - 10 reps RIGHT SIDE - HEAD press into pillow - 1 x daily - 7 x weekly - 1 sets - 10 reps - 5 second hold Sit to Stand with Armchair - 1-2 x daily - 5 x weekly - 2 sets - 4 reps Alternating Step Taps with Counter Support - 1-2 x daily - 5 x weekly - 2 sets - 5 reps

## 2021-04-03 NOTE — Therapy (Signed)
[facility] [address] [place], [place], [address] Phone: [phone]   Fax:  [phone]  Physical Therapy Treatment  Patient Details  Name: Mandy Durham MRN: [id number] Date of Birth: 1945/12/17 Referring Provider (PT): [name], [name], MD   Encounter Date: 04/03/2021   PT End of Session - 04/03/21 1727     Visit Number 7    Number of Visits 25    Date for PT Re-Evaluation 05/29/21    Authorization Type [employer]    PT Start Time 1405    PT Stop Time 1450    PT Time Calculation (min) 45 min    Activity Tolerance Patient tolerated treatment well    Behavior During Therapy [facility] for tasks assessed/performed             Past Medical History:  Diagnosis Date   Anxiety state, unspecified    Hyperlipidemia    Hypertension    Insomnia    LBBB (left bundle branch block)    LHC in 2002 showed normal coronaries.    Osteoarthrosis, unspecified whether generalized or localized, unspecified site    Overweight(278.02)    S/P placement of cardiac pacemaker    a. 02/21/16: Medtronic Advisa DR MRI SureScan model [id number] (serial number [id number])    Serrated adenoma of colon 2007   Symptomatic menopausal or female climacteric states    Syncope and collapse    11/12: Holter 12/12 with rare PACS, HR range 64-140, average 82, no significant arrhythmias. Echo (1/13): EF 50-55%, mild LVH, septal-lateral dyssynchrony c/w LBBB. 3-week event monitor (1/13): No significant arrhythmia.     Past Surgical History:  Procedure Laterality Date   COLONOSCOPY  2007   EP IMPLANTABLE DEVICE N/A 02/21/2016   Procedure: Pacemaker Implant;  Surgeon: Will Meredith Leeds, MD;  Location: [facility];  Service: Cardiovascular;  Laterality: N/A;   POLYPECTOMY      There were no vitals filed for this visit.   Subjective Assessment - 04/03/21 1410     Subjective Pt doing okay today; doesn't remember doing breathing  exercises last session    Pertinent History PMH: Parkinsonism anxiety, HLD, HTN, pacemaker, hx of TBI (2016)    Limitations Walking;House hold activities;Standing    Patient Stated Goals Wants to improve her balance, posture; improve pain    Currently in Pain? Yes    Pain Score 1     Pain Location Neck    Pain Onset More than a month ago                [facility] PT Assessment - 04/03/21 1414       ROM / Strength   AROM / PROM / Strength AROM      AROM   Overall AROM  Deficits    AROM Assessment Site Cervical    Cervical Flexion 25    Cervical Extension 10    Cervical - Right Side Bend 20    Cervical - Left Side Bend 30    Cervical - Right Rotation 30    Cervical - Left Rotation 40              [facility] Adult PT Treatment/Exercise - 04/03/21 1421       Self-Care   Self-Care --    Other Self-Care Comments  --      Neuro Re-ed    Neuro Re-ed Details  Diaphragmatic Breathing to decrease use of accessory muscles for breathing.  Performed in  recline with cues to "breathe out through a straw" and in through the nose to allow belly to rise.  Pt able to perform prolonged exhale but had increased difficulty initiating inhalation with diaphragm      Exercises   Exercises Neck      Neck Exercises: Supine   Cervical Isometrics Extension;Right lateral flexion;5 secs;10 reps    Cervical Isometrics Limitations therapist providing manual resistance for R lateral flexion isometric.  Also performed R lateral isometric in R sidelying, pressing into pillow    Other Supine Exercise Combined isometric neck extension with posterior hip extension with bridges x 8 reps with 5 second hold.               PT Education - 04/03/21 1727     Education Details updated HEP; diaphragmatic breathing training    Person(s) Educated Patient    Methods Explanation;Demonstration;Handout    Comprehension Verbalized understanding;Returned demonstration              PT Short Term Goals -  04/03/21 1728       PT SHORT TERM GOAL #1   Title Pt will be independent in initial HEP with husband supervision in order to build upon functional gains made in therapy. ALL STGS DUE 03/20/21    Baseline pt needs frequent reminders and review of HEP    Time 4    Period Weeks    Status On-going    Target Date 03/30/21      PT SHORT TERM GOAL #2   Title Cervical ROM to be assessed with STG/LTG written as appropriate.    Time 4    Period Weeks    Status Achieved      PT SHORT TERM GOAL #3   Title Pt will decr TUG time to 17 seconds or less in order to demo decr fall risk.    Baseline 22.72 seconds; 13.93 seconds on 04/01/21    Time 4    Period Weeks    Status Achieved      PT SHORT TERM GOAL #4   Title Will perform push and release test with LTG written in order to demo improved balance reactions.    Time 4    Period Weeks    Status Achieved      PT SHORT TERM GOAL #5   Title Pt will improve gait speed to at least 1.5 ft/sec with no AD vs. LRAD in order to demo decr fall risk.    Baseline 1.09 ft/sec with no AD; 2.32 ft/sec on 04/01/21    Time 4    Period Weeks    Status Achieved      PT SHORT TERM GOAL #6   Title improve cervical Rt sidebending to at least 15 deg for improved mobility    Baseline met - 20 deg of lateral flexion to R    Time 3    Period Weeks    Status Achieved    Target Date 03/30/21               PT Long Term Goals - 03/25/21 1203       PT LONG TERM GOAL #1   Title Pt will be independent in final HEP with husband supervision in order to build upon functional gains made in therapy. ALL LTGS DUE 04/27/21    Time 8    Period Weeks    Status New    Target Date 04/27/21      PT LONG TERM GOAL #2  Title Pt will recover posterior and anterior balance in push and release test 1 robust step independently, for improved balance recovery    Baseline anterior = 1 small delayed step, posterior = 3 small shuffled steps    Time 8    Period Weeks     Status Revised      PT LONG TERM GOAL #3   Title Pt will improve gait speed to at least 2.0 ft/sec with no AD vs. LRAD in order to demo decr fall risk.    Baseline 1.09 ft/sec no AD    Time 8    Period Weeks    Status New      PT LONG TERM GOAL #4   Title Pt will improve 5x sit <> stand to at least 16 seconds or less with no UE support in order to demo improved functional BLE strength.    Baseline 20.71 seconds, no UE support    Time 8    Period Weeks    Status New      PT LONG TERM GOAL #5   Title Pt will ambulate at least 500' over indoor and outdoor unlevel surfaces with supervision with no AD vs. LRAD in order to demo improved community mobility.    Time 8    Period Weeks    Status New      PT LONG TERM GOAL #6   Title report neck pain < 4/10 with activity for improved function    Status New      PT LONG TERM GOAL #7   Title demonstrate improved postural awareness and decreased episodes of cervical flexion when up amb at least 25% of the time    Status New                   Plan - 04/03/21 1729     Clinical Impression Statement Completed assessment of STG with re-assessment of cervical ROM. Pt able to bring head into neutral and then move through desired ROM.  Pt has improvd R lateral flexion to 20 deg past neutral.  Continued to review supine and sidelying ROM and strengthening exercises and updating/reviewing patient's HEP.  Continued to educate pt on diaphragmatic breathing to decrease use of accessory muscles but pt unable to sustain.  Will continue to address and progress towards LTG.    Personal Factors and Comorbidities Comorbidity 3+;Past/Current Experience;Time since onset of injury/illness/exacerbation    Comorbidities PMH: Parkinsonism, anxiety, HLD, HTN, pacemaker, syncope with collapse resulting in SAH/TBI 2016, aphasia/stuttering    Examination-Activity Limitations Transfers;Stairs;Stand;Locomotion Level    Examination-Participation Restrictions  Community Activity;Cleaning;Laundry;Driving    Stability/Clinical Decision Making Evolving/Moderate complexity    Rehab Potential Fair   due to deficits   PT Frequency 2x / week    PT Duration 12 weeks    PT Treatment/Interventions ADLs/Self Care Home Management;Moist Heat;DME Instruction;Gait training;Stair training;Functional mobility training;Therapeutic activities;Neuromuscular re-education;Balance training;Therapeutic exercise;Patient/family education;Manual techniques;Passive range of motion;Dry needling;Vestibular;Cryotherapy;Electrical Stimulation;Traction;Ultrasound;Taping    PT Next Visit Plan Supine isometric neck exercises > sidelying concentric exercises against gravity > R hip wedged and seated trunk excursion to L for head righting.  Continue to work on postural/cervical stretching and ROM. Work on functional strengthening, SLS, and stepping strategies. diapraghmatic breathing on wedge?  Monitor O2 as pt gets SOB with standing activities.    PT Home Exercise Plan Access Code: [email or web address]    Consulted and Agree with Plan of Care Patient;Family member/caregiver  Patient will benefit from skilled therapeutic intervention in order to improve the following deficits and impairments:  Abnormal gait, Decreased activity tolerance, Decreased coordination, Decreased balance, Decreased endurance, Decreased range of motion, Decreased strength, Difficulty walking, Impaired flexibility, Increased muscle spasms, Impaired sensation, Postural dysfunction, Pain, Decreased mobility, Decreased knowledge of use of DME, Impaired tone, Increased fascial restricitons, Impaired UE functional use  Visit Diagnosis: Abnormal posture  Cervicalgia  Muscle weakness (generalized)  Other abnormalities of gait and mobility     Problem List Patient Active Problem List   Diagnosis Date Noted   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[place]) 09/07/2018   Functional dyspepsia 09/08/2017   Slow transit constipation  09/08/2017   S/P placement of cardiac pacemaker    Mobitz II 02/20/2016   Nystagmus, end-position 02/12/2016   Dizziness and giddiness 02/12/2016   Flapping tremor 02/12/2016   Chronic fatigue 10/28/2015   PCP NOTES >>>> 02/13/2015   TBI (traumatic brain injury) 01/28/2015   Essential hypertension    Acute post-traumatic headache, not intractable    Skull fracture (HCC)    Vertigo due to brain injury    SAH (subarachnoid hemorrhage) ([place]) 01/21/2015   Annual physical exam 09/07/2014   Orthostatic hypotension 08/30/2014   Acromioclavicular arthrosis 03/21/2014   Allergic rhinitis 02/27/2014   SI (sacroiliac) joint dysfunction 02/02/2014   Gastrocnemius strain, left 12/11/2013   Back pain 08/22/2013   LBBB (left bundle branch block) 05/13/2013   Shingles outbreak 12/15/2012   Borderline diabetes    Insomnia 01/12/2008   HLD (hyperlipidemia) 02/04/2007   Anxiety 10/12/2006   Osteoarthritis 10/12/2006   Rico Junker, PT, DPT 04/03/21    5:34 PM    [facility] [address] [place], [place], [address] Phone: [phone]   Fax:  [phone]  Name: Delva LORALEI RADCLIFFE MRN: [id number] Date of Birth: 03-May-1945

## 2021-04-09 ENCOUNTER — Other Ambulatory Visit: Payer: PPO | Admitting: *Deleted

## 2021-04-09 ENCOUNTER — Other Ambulatory Visit: Payer: Self-pay

## 2021-04-09 ENCOUNTER — Ambulatory Visit: Payer: PPO | Admitting: Physical Therapy

## 2021-04-09 ENCOUNTER — Other Ambulatory Visit: Payer: PPO

## 2021-04-09 ENCOUNTER — Encounter: Payer: Self-pay | Admitting: Physical Therapy

## 2021-04-09 DIAGNOSIS — M6281 Muscle weakness (generalized): Secondary | ICD-10-CM

## 2021-04-09 DIAGNOSIS — R293 Abnormal posture: Secondary | ICD-10-CM

## 2021-04-09 DIAGNOSIS — Z79899 Other long term (current) drug therapy: Secondary | ICD-10-CM

## 2021-04-09 DIAGNOSIS — R2689 Other abnormalities of gait and mobility: Secondary | ICD-10-CM

## 2021-04-09 DIAGNOSIS — M542 Cervicalgia: Secondary | ICD-10-CM

## 2021-04-09 NOTE — Patient Instructions (Signed)
Access Code: [email or web address] URL: [email or web address] Date: 04/09/2021 Prepared by: [name]  Exercises Seated Upper Trapezius Stretch - 2 x daily - 7 x weekly - 1 sets - 3 reps - 30 sec hold Supine Cervical Retraction with Towel - 2 x daily - 7 x weekly - 1 sets - 10 reps - 3 hold Supine Scapular Retraction - 2 x daily - 7 x weekly - 1-2 sets - 10 reps Supine Bridge - 1 x daily - 7 x weekly - 1 sets - 10 reps RIGHT SIDE - HEAD press into pillow - 1 x daily - 7 x weekly - 1 sets - 10 reps - 5 second hold Sit to Stand with Armchair - 1-2 x daily - 5 x weekly - 2 sets - 4 reps Alternating Step Taps with Counter Support - 1-2 x daily - 5 x weekly - 2 sets - 5 reps Supine Diaphragmatic Breathing - 1-2 x daily - 7 x weekly - 1-2 sets - 10 reps

## 2021-04-09 NOTE — Therapy (Signed)
[facility] [address] [place], [place], [address] Phone: [phone]   Fax:  [phone]  Physical Therapy Treatment  Patient Details  Name: Mandy Durham MRN: [id number] Date of Birth: [birth year] Referring Provider (PT): [name], [name], MD   Encounter Date: 04/09/2021   PT End of Session - 04/09/21 1409     Visit Number 8    Number of Visits 25    Date for PT Re-Evaluation 05/29/21    Authorization Type [employer]    PT Start Time 1404    PT Stop Time 1444    PT Time Calculation (min) 40 min    Activity Tolerance Patient tolerated treatment well    Behavior During Therapy [facility] for tasks assessed/performed             Past Medical History:  Diagnosis Date   Anxiety state, unspecified    Hyperlipidemia    Hypertension    Insomnia    LBBB (left bundle branch block)    LHC in 2002 showed normal coronaries.    Osteoarthrosis, unspecified whether generalized or localized, unspecified site    Overweight(278.02)    S/P placement of cardiac pacemaker    a. 02/21/16: Medtronic Advisa DR MRI SureScan model X3KG40 (serial number [id number])    Serrated adenoma of colon 2007   Symptomatic menopausal or female climacteric states    Syncope and collapse    11/12: Holter 12/12 with rare PACS, HR range 64-140, average 82, no significant arrhythmias. Echo (1/13): EF 50-55%, mild LVH, septal-lateral dyssynchrony c/w LBBB. 3-week event monitor (1/13): No significant arrhythmia.     Past Surgical History:  Procedure Laterality Date   COLONOSCOPY  2007   EP IMPLANTABLE DEVICE N/A 02/21/2016   Procedure: Pacemaker Implant;  Surgeon: Will Meredith Leeds, MD;  Location: [facility];  Service: Cardiovascular;  Laterality: N/A;   POLYPECTOMY      There were no vitals filed for this visit.   Subjective Assessment - 04/09/21 1407     Subjective Reports that she is feeling better today. Feels like  things are getting more manageable. Feels like she is able to hold her head up longer than she has been.    Pertinent History PMH: Parkinsonism anxiety, HLD, HTN, pacemaker, hx of TBI (2016)    Limitations Walking;House hold activities;Standing    Patient Stated Goals Wants to improve her balance, posture; improve pain    Currently in Pain? No/denies    Pain Onset More than a month ago                               [facility] Adult PT Treatment/Exercise - 04/09/21 1420       Neuro Re-ed    Neuro Re-ed Details  Continued to work on diaphragmatic breathing in supine position - cued pt to focus on SLOW breathing with pt demonstrating activation of diaphragm during inspiration. Working on equal breathing with inhale for a count of 4 and exhale of 4 count. Performed with eyes closed, 2 sets of 8 breaths. Pt reporting feeling more relaxed after performing. Added to HEP to perform in supine only focusing on SLOW breathing as this cue has seemed to work best. Seated at edge of mat table: x10 reps each side seated PWR rock for weight shifting, cued to look gently up at hand.      Neck Exercises: Seated   Cervical Isometrics --  Other Seated Exercise x5 reps scapular retraction with 5 second hold, verbal/tactile cues for technique.      Neck Exercises: Supine   Cervical Isometrics Right lateral flexion;5 secs;10 reps    Cervical Isometrics Limitations therapist providing manual resistance for R lateral flexion isometric in supine - performed after stretching L upper trap for pt to perform in midline positioning.    Neck Retraction 10 reps    Neck Retraction Limitations into pillow, initial verbal/tactile cues for technique, cued to make a double chin (worked well as a cue), holding for 3-4 seconds    Other Supine Exercise x10 reps bridging with 5 second hold - reviewed from HEP. Cued to count to help with breathing      Neck Exercises: Sidelying   Lateral Flexion Right    Lateral  Flexion Limitations R lateral flexion isometric in R sidelying, pressing into pillow - 5 second holds, reviewed from HEP      Neck Exercises: Stretches   Upper Trapezius Stretch Left;3 reps;30 seconds    Upper Trapezius Stretch Limitations with muscle energy techique for shoulder depression. Cued for slowed breathing throughout                       PT Short Term Goals - 04/03/21 1728       PT SHORT TERM GOAL #1   Title Pt will be independent in initial HEP with husband supervision in order to build upon functional gains made in therapy. ALL STGS DUE 03/20/21    Baseline pt needs frequent reminders and review of HEP    Time 4    Period Weeks    Status On-going    Target Date 03/30/21      PT SHORT TERM GOAL #2   Title Cervical ROM to be assessed with STG/LTG written as appropriate.    Time 4    Period Weeks    Status Achieved      PT SHORT TERM GOAL #3   Title Pt will decr TUG time to 17 seconds or less in order to demo decr fall risk.    Baseline 22.72 seconds; 13.93 seconds on 04/01/21    Time 4    Period Weeks    Status Achieved      PT SHORT TERM GOAL #4   Title Will perform push and release test with LTG written in order to demo improved balance reactions.    Time 4    Period Weeks    Status Achieved      PT SHORT TERM GOAL #5   Title Pt will improve gait speed to at least 1.5 ft/sec with no AD vs. LRAD in order to demo decr fall risk.    Baseline 1.09 ft/sec with no AD; 2.32 ft/sec on 04/01/21    Time 4    Period Weeks    Status Achieved      PT SHORT TERM GOAL #6   Title improve cervical Rt sidebending to at least 15 deg for improved mobility    Baseline met - 20 deg of lateral flexion to R    Time 3    Period Weeks    Status Achieved    Target Date 03/30/21               PT Long Term Goals - 03/25/21 1203       PT LONG TERM GOAL #1   Title Pt will be independent in final HEP with husband supervision in order  to build upon functional  gains made in therapy. ALL LTGS DUE 04/27/21    Time 8    Period Weeks    Status New    Target Date 04/27/21      PT LONG TERM GOAL #2   Title Pt will recover posterior and anterior balance in push and release test 1 robust step independently, for improved balance recovery    Baseline anterior = 1 small delayed step, posterior = 3 small shuffled steps    Time 8    Period Weeks    Status Revised      PT LONG TERM GOAL #3   Title Pt will improve gait speed to at least 2.0 ft/sec with no AD vs. LRAD in order to demo decr fall risk.    Baseline 1.09 ft/sec no AD    Time 8    Period Weeks    Status New      PT LONG TERM GOAL #4   Title Pt will improve 5x sit <> stand to at least 16 seconds or less with no UE support in order to demo improved functional BLE strength.    Baseline 20.71 seconds, no UE support    Time 8    Period Weeks    Status New      PT LONG TERM GOAL #5   Title Pt will ambulate at least 500' over indoor and outdoor unlevel surfaces with supervision with no AD vs. LRAD in order to demo improved community mobility.    Time 8    Period Weeks    Status New      PT LONG TERM GOAL #6   Title report neck pain < 4/10 with activity for improved function    Status New      PT LONG TERM GOAL #7   Title demonstrate improved postural awareness and decreased episodes of cervical flexion when up amb at least 25% of the time    Status New                   Plan - 04/09/21 1523     Clinical Impression Statement Today's skilled session continued to focus on supine/sidelying/seated ROM and strengthening exercises Needed reminder cues for technique for exercises added at previous session. Pt did better with diaphragmatic breathing today with cued to focus on SLOW breathing with pt able to activate during inspiration. Afterwards pt reporting feeling more relaxed afterwards. Will continue to progress towards LTGs.    Personal Factors and Comorbidities Comorbidity  3+;Past/Current Experience;Time since onset of injury/illness/exacerbation    Comorbidities PMH: Parkinsonism, anxiety, HLD, HTN, pacemaker, syncope with collapse resulting in SAH/TBI 2016, aphasia/stuttering    Examination-Activity Limitations Transfers;Stairs;Stand;Locomotion Level    Examination-Participation Restrictions Community Activity;Cleaning;Laundry;Driving    Stability/Clinical Decision Making Evolving/Moderate complexity    Rehab Potential Fair   due to deficits   PT Frequency 2x / week    PT Duration 12 weeks    PT Treatment/Interventions ADLs/Self Care Home Management;Moist Heat;DME Instruction;Gait training;Stair training;Functional mobility training;Therapeutic activities;Neuromuscular re-education;Balance training;Therapeutic exercise;Patient/family education;Manual techniques;Passive range of motion;Dry needling;Vestibular;Cryotherapy;Electrical Stimulation;Traction;Ultrasound;Taping    PT Next Visit Plan Supine isometric neck exercises > sidelying concentric exercises against gravity > R hip wedged and seated trunk excursion to L for head righting.  Continue to work on postural/cervical stretching and ROM. Work on functional strengthening, SLS, and stepping strategies. review diaphragm breathing - cues for SLOW. Monitor O2 as pt gets SOB with standing activities.    PT Home Exercise Plan  Access Code: [email or web address]    Consulted and Agree with Plan of Care Patient;Family member/caregiver             Patient will benefit from skilled therapeutic intervention in order to improve the following deficits and impairments:  Abnormal gait, Decreased activity tolerance, Decreased coordination, Decreased balance, Decreased endurance, Decreased range of motion, Decreased strength, Difficulty walking, Impaired flexibility, Increased muscle spasms, Impaired sensation, Postural dysfunction, Pain, Decreased mobility, Decreased knowledge of use of DME, Impaired tone, Increased fascial restricitons,  Impaired UE functional use  Visit Diagnosis: Abnormal posture  Cervicalgia  Muscle weakness (generalized)  Other abnormalities of gait and mobility     Problem List Patient Active Problem List   Diagnosis Date Noted   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[place]) 09/07/2018   Functional dyspepsia 09/08/2017   Slow transit constipation 09/08/2017   S/P placement of cardiac pacemaker    Mobitz II 02/20/2016   Nystagmus, end-position 02/12/2016   Dizziness and giddiness 02/12/2016   Flapping tremor 02/12/2016   Chronic fatigue 10/28/2015   PCP NOTES >>>> 02/13/2015   TBI (traumatic brain injury) 01/28/2015   Essential hypertension    Acute post-traumatic headache, not intractable    Skull fracture ([facility])    Vertigo due to brain injury    SAH (subarachnoid hemorrhage) ([place]) 01/21/2015   Annual physical exam 09/07/2014   Orthostatic hypotension 08/30/2014   Acromioclavicular arthrosis 03/21/2014   Allergic rhinitis 02/27/2014   SI (sacroiliac) joint dysfunction 02/02/2014   Gastrocnemius strain, left 12/11/2013   Back pain 08/22/2013   LBBB (left bundle branch block) 05/13/2013   Shingles outbreak 12/15/2012   Borderline diabetes    Insomnia 01/12/2008   HLD (hyperlipidemia) 02/04/2007   Anxiety 10/12/2006   Osteoarthritis 10/12/2006    Arliss Journey, PT, DPT  04/09/2021, 3:25 PM  [facility] [address] [place], [place], [address] Phone: [phone]   Fax:  [phone]  Name: Mandy Durham MRN: [id number] Date of Birth: 12-19-1945

## 2021-04-10 ENCOUNTER — Other Ambulatory Visit: Payer: PPO

## 2021-04-10 LAB — BASIC METABOLIC PANEL
BUN/Creatinine Ratio: 17 (ref 12–28)
BUN: 15 mg/dL (ref 8–27)
CO2: 23 mmol/L (ref 20–29)
Calcium: 9.1 mg/dL (ref 8.7–10.3)
Chloride: 103 mmol/L (ref 96–106)
Creatinine, Ser: 0.89 mg/dL (ref 0.57–1.00)
Glucose: 105 mg/dL — ABNORMAL HIGH (ref 70–99)
Potassium: 4.1 mmol/L (ref 3.5–5.2)
Sodium: 143 mmol/L (ref 134–144)
eGFR: 67 mL/min/{1.73_m2} (ref >59)

## 2021-04-11 ENCOUNTER — Ambulatory Visit: Payer: PPO | Admitting: Physical Therapy

## 2021-04-14 ENCOUNTER — Encounter: Payer: Self-pay | Admitting: Pulmonary Disease

## 2021-04-14 ENCOUNTER — Ambulatory Visit (INDEPENDENT_AMBULATORY_CARE_PROVIDER_SITE_OTHER): Payer: PPO | Admitting: Pulmonary Disease

## 2021-04-14 ENCOUNTER — Ambulatory Visit (INDEPENDENT_AMBULATORY_CARE_PROVIDER_SITE_OTHER): Payer: PPO

## 2021-04-14 ENCOUNTER — Other Ambulatory Visit: Payer: Self-pay

## 2021-04-14 VITALS — BP 136/74 | HR 117 | Temp 98.5°F | Ht 64.0 in | Wt 109.2 lb

## 2021-04-14 DIAGNOSIS — R0609 Other forms of dyspnea: Secondary | ICD-10-CM

## 2021-04-14 DIAGNOSIS — R0600 Dyspnea, unspecified: Secondary | ICD-10-CM | POA: Diagnosis not present

## 2021-04-14 DIAGNOSIS — W19XXXA Unspecified fall, initial encounter: Secondary | ICD-10-CM

## 2021-04-14 DIAGNOSIS — R079 Chest pain, unspecified: Secondary | ICD-10-CM | POA: Diagnosis not present

## 2021-04-14 LAB — PULMONARY FUNCTION TEST
FEF 25-75 Post: 1.76 L/sec
FEF 25-75 Pre: 1.23 L/sec
FEF2575-%Change-Post: 43 %
FEF2575-%Pred-Post: 109 %
FEF2575-%Pred-Pre: 76 %
FEV1-%Change-Post: 11 %
FEV1-%Pred-Post: 89 %
FEV1-%Pred-Pre: 80 %
FEV1-Post: 1.86 L
FEV1-Pre: 1.67 L
FEV1FVC-%Change-Post: 4 %
FEV1FVC-%Pred-Pre: 99 %
FEV6-%Change-Post: 3 %
FEV6-%Pred-Post: 88 %
FEV6-%Pred-Pre: 85 %
FEV6-Post: 2.34 L
FEV6-Pre: 2.25 L
FEV6FVC-%Change-Post: 0 %
FEV6FVC-%Pred-Post: 104 %
FEV6FVC-%Pred-Pre: 105 %
FVC-%Change-Post: 6 %
FVC-%Pred-Post: 86 %
FVC-%Pred-Pre: 81 %
FVC-Post: 2.4 L
FVC-Pre: 2.26 L
Post FEV1/FVC ratio: 77 %
Post FEV6/FVC ratio: 99 %
Pre FEV1/FVC ratio: 74 %
Pre FEV6/FVC Ratio: 100 %

## 2021-04-14 NOTE — Progress Notes (Signed)
Spirometry pre and post done today. 

## 2021-04-14 NOTE — Patient Instructions (Signed)
Nice to see you again  Stop Symbicort  Try walking to mailbox twice a day at least 5 days of the week, more days if you can.  After a couple weeks if the walk is easier, walk farther to the garage twice a day as above.  Return to clinic in 2 months or sooner as needed with Dr. Silas Flood

## 2021-04-15 ENCOUNTER — Ambulatory Visit: Payer: PPO | Admitting: Physical Therapy

## 2021-04-17 ENCOUNTER — Ambulatory Visit: Payer: PPO | Admitting: Physical Therapy

## 2021-04-22 ENCOUNTER — Ambulatory Visit: Payer: PPO | Attending: Adult Health | Admitting: Physical Therapy

## 2021-04-22 ENCOUNTER — Encounter: Payer: Self-pay | Admitting: Physical Therapy

## 2021-04-22 ENCOUNTER — Other Ambulatory Visit: Payer: Self-pay

## 2021-04-22 DIAGNOSIS — R2681 Unsteadiness on feet: Secondary | ICD-10-CM | POA: Diagnosis not present

## 2021-04-22 DIAGNOSIS — M6281 Muscle weakness (generalized): Secondary | ICD-10-CM | POA: Insufficient documentation

## 2021-04-22 DIAGNOSIS — R2689 Other abnormalities of gait and mobility: Secondary | ICD-10-CM | POA: Insufficient documentation

## 2021-04-22 DIAGNOSIS — R293 Abnormal posture: Secondary | ICD-10-CM | POA: Diagnosis not present

## 2021-04-22 DIAGNOSIS — M542 Cervicalgia: Secondary | ICD-10-CM | POA: Insufficient documentation

## 2021-04-22 NOTE — Therapy (Addendum)
[facility] [address] [place], [place], [address] Phone: [phone]   Fax:  [phone]  Physical Therapy Treatment  Patient Details  Name: Mandy Durham MRN: [id number] Date of Birth: Mar 17, 1945 Referring Provider (PT): [name], [name], MD   Encounter Date: 04/22/2021   PT End of Session - 04/22/21 1319     Visit Number 9    Number of Visits 25    Date for PT Re-Evaluation 05/29/21    Authorization Type [employer]    PT Start Time 1317    PT Stop Time 1400    PT Time Calculation (min) 43 min    Activity Tolerance Patient tolerated treatment well    Behavior During Therapy [facility] for tasks assessed/performed             Past Medical History:  Diagnosis Date   Anxiety state, unspecified    Hyperlipidemia    Hypertension    Insomnia    LBBB (left bundle branch block)    LHC in 2002 showed normal coronaries.    Osteoarthrosis, unspecified whether generalized or localized, unspecified site    Overweight(278.02)    S/P placement of cardiac pacemaker    a. 02/21/16: Medtronic Advisa DR MRI SureScan model E2CM03 (serial number [id number])    Serrated adenoma of colon 2007   Symptomatic menopausal or female climacteric states    Syncope and collapse    11/12: Holter 12/12 with rare PACS, HR range 64-140, average 82, no significant arrhythmias. Echo (1/13): EF 50-55%, mild LVH, septal-lateral dyssynchrony c/w LBBB. 3-week event monitor (1/13): No significant arrhythmia.     Past Surgical History:  Procedure Laterality Date   COLONOSCOPY  2007   EP IMPLANTABLE DEVICE N/A 02/21/2016   Procedure: Pacemaker Implant;  Surgeon: Will Meredith Leeds, MD;  Location: [facility];  Service: Cardiovascular;  Laterality: N/A;   POLYPECTOMY      There were no vitals filed for this visit.   Subjective Assessment - 04/22/21 1319     Subjective Nothing new, neck is feeling ok today. Getting an MRI  of her cervical and thoracic spine this Friday. Worked on her breathing exercises at home and reports it helped to calm her down.    Pertinent History PMH: Parkinsonism anxiety, HLD, HTN, pacemaker, hx of TBI (2016)    Limitations Walking;House hold activities;Standing    Patient Stated Goals Wants to improve her balance, posture; improve pain    Currently in Pain? No/denies    Pain Onset More than a month ago                               [facility] Adult PT Treatment/Exercise - 04/22/21 1343       Transfers   Transfers Sit to Stand;Stand to Sit    Sit to Stand 5: Supervision;Without upper extremity assist    Stand to Sit 5: Supervision;Without upper extremity assist    Comments x5 reps without UE support, cued for posture and scap retraction in standing.      Exercises   Exercises Neck      Neck Exercises: Seated   Cervical Isometrics Extension;Right lateral flexion;5 secs;5 reps   2 sets of 5 reps   Cervical Isometrics Limitations In sitting with pillow behind head for tactile cue for cervical extension, performed against therapist's manual resistance into R lateral flexion x5 seconds    Neck Retraction Limitations;10 reps  Neck Retraction Limitations Seated against pillow, cues for chin tuck first and gently pressing head back into pillow x5 second hold for each    Other Seated Exercise Seated cervical retraction against pillow and then performing R cervical rotation to pt's tolerance x5 reps, pt fatigues easily with this    Other Seated Exercise Seated on mat with R hip wedged - performed trunk excursions and thoracic elongation on L with reaching laterally to L to tap therapist's hand and looking at hand, then returning back to midline x7 reps. Removed wedge and performed same activity to R for incr weight bearing and cervial ROM, cues to look at hand when reaching for cervical rotation x7 reps. Pt fatigued easily with this.      Neck Exercises: Supine   Cervical  Isometrics Right lateral flexion;5 secs;10 reps    Cervical Isometrics Limitations therapist providing manual resistance for R lateral flexion isometric in supine - performed after stretching L upper trap for pt to perform in midline positioning.    Cervical Rotation Right;Limitations;10 reps    Cervical Rotation Limitations AROM to R<> midline, holding for a couple seconds at end range for an incr stretch      Neck Exercises: Stretches   Upper Trapezius Stretch Left;30 seconds;2 reps    Upper Trapezius Stretch Limitations with muscle energy techique for shoulder depression. Cued for slowed breathing throughout. Performed at beginning of session                     PT Education - 04/22/21 1401     Education Details Re-scheduling appt on Feb 28th, reviewed seated upper trap stretch for HEP    Person(s) Educated Patient    Methods Explanation;Demonstration    Comprehension Returned demonstration;Verbalized understanding              PT Short Term Goals - 04/03/21 1728       PT SHORT TERM GOAL #1   Title Pt will be independent in initial HEP with husband supervision in order to build upon functional gains made in therapy. ALL STGS DUE 03/20/21    Baseline pt needs frequent reminders and review of HEP    Time 4    Period Weeks    Status On-going    Target Date 03/30/21      PT SHORT TERM GOAL #2   Title Cervical ROM to be assessed with STG/LTG written as appropriate.    Time 4    Period Weeks    Status Achieved      PT SHORT TERM GOAL #3   Title Pt will decr TUG time to 17 seconds or less in order to demo decr fall risk.    Baseline 22.72 seconds; 13.93 seconds on 04/01/21    Time 4    Period Weeks    Status Achieved      PT SHORT TERM GOAL #4   Title Will perform push and release test with LTG written in order to demo improved balance reactions.    Time 4    Period Weeks    Status Achieved      PT SHORT TERM GOAL #5   Title Pt will improve gait speed to at  least 1.5 ft/sec with no AD vs. LRAD in order to demo decr fall risk.    Baseline 1.09 ft/sec with no AD; 2.32 ft/sec on 04/01/21    Time 4    Period Weeks    Status Achieved  PT SHORT TERM GOAL #6   Title improve cervical Rt sidebending to at least 15 deg for improved mobility    Baseline met - 20 deg of lateral flexion to R    Time 3    Period Weeks    Status Achieved    Target Date 03/30/21               PT Long Term Goals - 03/25/21 1203       PT LONG TERM GOAL #1   Title Pt will be independent in final HEP with husband supervision in order to build upon functional gains made in therapy. ALL LTGS DUE 04/27/21    Time 8    Period Weeks    Status New    Target Date 04/27/21      PT LONG TERM GOAL #2   Title Pt will recover posterior and anterior balance in push and release test 1 robust step independently, for improved balance recovery    Baseline anterior = 1 small delayed step, posterior = 3 small shuffled steps    Time 8    Period Weeks    Status Revised      PT LONG TERM GOAL #3   Title Pt will improve gait speed to at least 2.0 ft/sec with no AD vs. LRAD in order to demo decr fall risk.    Baseline 1.09 ft/sec no AD    Time 8    Period Weeks    Status New      PT LONG TERM GOAL #4   Title Pt will improve 5x sit <> stand to at least 16 seconds or less with no UE support in order to demo improved functional BLE strength.    Baseline 20.71 seconds, no UE support    Time 8    Period Weeks    Status New      PT LONG TERM GOAL #5   Title Pt will ambulate at least 500' over indoor and outdoor unlevel surfaces with supervision with no AD vs. LRAD in order to demo improved community mobility.    Time 8    Period Weeks    Status New      PT LONG TERM GOAL #6   Title report neck pain < 4/10 with activity for improved function    Status New      PT LONG TERM GOAL #7   Title demonstrate improved postural awareness and decreased episodes of cervical flexion  when up amb at least 25% of the time    Status New                   Plan - 04/23/21 2841     Clinical Impression Statement Continued to work on postural abnormalities with supine ROM and isometric exercises. Also performed seated postural control and strengthening exercises for cervical retraction/extension and rotation. Pt challenged by seated exercises today and fatigued easily, but still able to tolerate well. Pt able to perform 5 reps of sit <> stands with no reports of SOB (previously only able to perform 3). Will continue to progress towards LTGs.    Personal Factors and Comorbidities Comorbidity 3+;Past/Current Experience;Time since onset of injury/illness/exacerbation    Comorbidities PMH: Parkinsonism, anxiety, HLD, HTN, pacemaker, syncope with collapse resulting in SAH/TBI 2016, aphasia/stuttering    Examination-Activity Limitations Transfers;Stairs;Stand;Locomotion Level    Examination-Participation Restrictions Community Activity;Cleaning;Laundry;Driving    Stability/Clinical Decision Making Evolving/Moderate complexity    Rehab Potential Fair   due to deficits  PT Frequency 2x / week    PT Duration 12 weeks    PT Treatment/Interventions ADLs/Self Care Home Management;Moist Heat;DME Instruction;Gait training;Stair training;Functional mobility training;Therapeutic activities;Neuromuscular re-education;Balance training;Therapeutic exercise;Patient/family education;Manual techniques;Passive range of motion;Dry needling;Vestibular;Cryotherapy;Electrical Stimulation;Traction;Ultrasound;Taping    PT Next Visit Plan 10TH VISIT PN. add more visits?? Supine and seated isometric neck exercises > try sidelying concentric exercises against gravity. Continue to work on postural/cervical stretching and ROM. Work on functional strengthening, SLS, and stepping strategies. review diaphragm breathing - cues for SLOW. Monitor O2 as pt gets SOB with standing activities.    PT Home Exercise Plan  Access Code: [email or web address]    Consulted and Agree with Plan of Care Patient;Family member/caregiver             Patient will benefit from skilled therapeutic intervention in order to improve the following deficits and impairments:  Abnormal gait, Decreased activity tolerance, Decreased coordination, Decreased balance, Decreased endurance, Decreased range of motion, Decreased strength, Difficulty walking, Impaired flexibility, Increased muscle spasms, Impaired sensation, Postural dysfunction, Pain, Decreased mobility, Decreased knowledge of use of DME, Impaired tone, Increased fascial restricitons, Impaired UE functional use  Visit Diagnosis: Abnormal posture  Cervicalgia  Muscle weakness (generalized)  Other abnormalities of gait and mobility     Problem List Patient Active Problem List   Diagnosis Date Noted   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   Functional dyspepsia 09/08/2017   Slow transit constipation 09/08/2017   S/P placement of cardiac pacemaker    Mobitz II 02/20/2016   Nystagmus, end-position 02/12/2016   Dizziness and giddiness 02/12/2016   Flapping tremor 02/12/2016   Chronic fatigue 10/28/2015   PCP NOTES >>>> 02/13/2015   TBI (traumatic brain injury) 01/28/2015   Essential hypertension    Acute post-traumatic headache, not intractable    Skull fracture ([facility])    Vertigo due to brain injury    SAH (subarachnoid hemorrhage) ([facility]) 01/21/2015   Annual physical exam 09/07/2014   Orthostatic hypotension 08/30/2014   Acromioclavicular arthrosis 03/21/2014   Allergic rhinitis 02/27/2014   SI (sacroiliac) joint dysfunction 02/02/2014   Gastrocnemius strain, left 12/11/2013   Back pain 08/22/2013   LBBB (left bundle branch block) 05/13/2013   Shingles outbreak 12/15/2012   Borderline diabetes    Insomnia 01/12/2008   HLD (hyperlipidemia) 02/04/2007   Anxiety 10/12/2006   Osteoarthritis 10/12/2006    Arliss Journey, PT, DPT  04/23/2021, 9:26 AM  [facility] [address] [place], [place], [address] Phone: [phone]   Fax:  [phone]  Name: Mandy Durham MRN: [id number] Date of Birth: 01/30/1946

## 2021-04-23 ENCOUNTER — Other Ambulatory Visit: Payer: Self-pay

## 2021-04-23 DIAGNOSIS — R627 Adult failure to thrive: Secondary | ICD-10-CM

## 2021-04-23 DIAGNOSIS — I10 Essential (primary) hypertension: Secondary | ICD-10-CM

## 2021-04-23 DIAGNOSIS — S069X2S Unspecified intracranial injury with loss of consciousness of 31 minutes to 59 minutes, sequela: Secondary | ICD-10-CM

## 2021-04-24 ENCOUNTER — Telehealth: Payer: Self-pay | Admitting: *Deleted

## 2021-04-24 NOTE — Chronic Care Management (AMB) (Signed)
Chronic Care Management   Note  04/24/2021 Name: Camri TINITA BROOKER MRN: [id number] DOB: [birth year]  Mandy Durham NEEDS is a 76 y.o. year old female who is a primary care patient of Colon Branch, MD. I reached out to Michaila S Eidson by phone today in response to a referral sent by Ms. Aria S Trani's PCP.  Ms. Behlke was given information about Chronic Care Management services today including:  CCM service includes personalized support from designated clinical staff supervised by her physician, including individualized plan of care and coordination with other care providers 24/7 contact phone numbers for assistance for urgent and routine care needs. Service will only be billed when office clinical staff spend 20 minutes or more in a month to coordinate care. Only one practitioner may furnish and bill the service in a calendar month. The patient may stop CCM services at any time (effective at the end of the month) by phone call to the office staff. The patient is responsible for co-pay (up to 20% after annual deductible is met) if co-pay is required by the individual health plan.   Patient agreed to services and verbal consent obtained.   Follow up plan: Telephone appointment with care management team member scheduled for: 05/14/2021 (pt requested date)   Julian Hy, [facility]  Direct Dial: [phone]

## 2021-04-25 ENCOUNTER — Ambulatory Visit (INDEPENDENT_AMBULATORY_CARE_PROVIDER_SITE_OTHER): Payer: PPO

## 2021-04-25 ENCOUNTER — Ambulatory Visit (HOSPITAL_COMMUNITY)
Admission: RE | Admit: 2021-04-25 | Discharge: 2021-04-25 | Disposition: A | Discharge status: Home / Self Care | Payer: PPO | Source: Ambulatory Visit | Attending: Neurosurgery | Admitting: Neurosurgery

## 2021-04-25 ENCOUNTER — Other Ambulatory Visit: Payer: Self-pay

## 2021-04-25 DIAGNOSIS — M4802 Spinal stenosis, cervical region: Secondary | ICD-10-CM | POA: Diagnosis not present

## 2021-04-25 DIAGNOSIS — I441 Atrioventricular block, second degree: Secondary | ICD-10-CM

## 2021-04-25 DIAGNOSIS — M542 Cervicalgia: Secondary | ICD-10-CM | POA: Diagnosis not present

## 2021-04-25 DIAGNOSIS — M4312 Spondylolisthesis, cervical region: Secondary | ICD-10-CM | POA: Diagnosis not present

## 2021-04-25 DIAGNOSIS — M2578 Osteophyte, vertebrae: Secondary | ICD-10-CM | POA: Diagnosis not present

## 2021-04-25 DIAGNOSIS — M40204 Unspecified kyphosis, thoracic region: Secondary | ICD-10-CM | POA: Diagnosis not present

## 2021-04-25 DIAGNOSIS — G959 Disease of spinal cord, unspecified: Secondary | ICD-10-CM | POA: Diagnosis not present

## 2021-04-25 NOTE — Progress Notes (Signed)
Per order, Changed device settings for MRI to  DOO at 95 bpm  Will program device back to pre-MRI settings after completion of exam, and send transmission 

## 2021-04-26 LAB — CUP PACEART REMOTE DEVICE CHECK
Battery Remaining Longevity: 52 mo
Battery Voltage: 2.99 V
Brady Statistic AP VP Percent: 1.4 %
Brady Statistic AP VS Percent: 0.46 %
Brady Statistic AS VP Percent: 53.5 %
Brady Statistic AS VS Percent: 44.65 %
Brady Statistic RA Percent Paced: 1.84 %
Brady Statistic RV Percent Paced: 58.12 %
Date Time Interrogation Session: 20230210134853
Implantable Lead Implant Date: 20171208
Implantable Lead Implant Date: 20171208
Implantable Lead Location: 753859
Implantable Lead Location: 753860
Implantable Lead Model: 5076
Implantable Lead Model: 5076
Implantable Pulse Generator Implant Date: 20171208
Lead Channel Impedance Value: 323 Ohm
Lead Channel Impedance Value: 342 Ohm
Lead Channel Impedance Value: 437 Ohm
Lead Channel Impedance Value: 437 Ohm
Lead Channel Pacing Threshold Amplitude: 0.625 V
Lead Channel Pacing Threshold Amplitude: 0.625 V
Lead Channel Pacing Threshold Pulse Width: 0.4 ms
Lead Channel Pacing Threshold Pulse Width: 0.4 ms
Lead Channel Sensing Intrinsic Amplitude: 1.125 mV
Lead Channel Sensing Intrinsic Amplitude: 1.125 mV
Lead Channel Sensing Intrinsic Amplitude: 18 mV
Lead Channel Sensing Intrinsic Amplitude: 18 mV
Lead Channel Setting Pacing Amplitude: 2 V
Lead Channel Setting Pacing Amplitude: 2.5 V
Lead Channel Setting Pacing Pulse Width: 0.4 ms
Lead Channel Setting Sensing Sensitivity: 4 mV

## 2021-04-27 ENCOUNTER — Telehealth: Payer: Self-pay | Admitting: *Deleted

## 2021-04-27 NOTE — Telephone Encounter (Signed)
°  Care Management   Follow Up Note   04/27/2021 Name: Mandy Durham MRN: [id number] DOB: 09-01-1945  Referred by: Colon Branch, MD  Reason for Referral:  Chronic Care Management Needs in Patient with Hypertension, Vascular Parkinsonism, Traumatic Brain Injury, Subarachnoid Hemorrhage, Skull Fracture, Dizziness and Giddiness, Anxiety, and Chronic Fatigue.  An unsuccessful telephone outreach was attempted today. The patient was referred to the case management team for assistance with care management and care coordination. HIPAA compliant messages left on voicemail for both patient and husband, including contact information, encouraging them to return LCSW's call at their earliest convenience.  LCSW will make the next initial telephone outreach call attempt on 05/14/2021 at 3:00 pm, set forth by [place], unless time permits beforehand.  Follow-Up Plan:  05/14/2021 at 3:00 pm  Nat Christen George Clinical Social Worker [place] Willacy [phone]

## 2021-04-29 ENCOUNTER — Encounter: Payer: Self-pay | Admitting: Physical Therapy

## 2021-04-29 ENCOUNTER — Other Ambulatory Visit: Payer: Self-pay

## 2021-04-29 ENCOUNTER — Ambulatory Visit: Payer: PPO | Admitting: Physical Therapy

## 2021-04-29 DIAGNOSIS — R293 Abnormal posture: Secondary | ICD-10-CM | POA: Diagnosis not present

## 2021-04-29 DIAGNOSIS — R2681 Unsteadiness on feet: Secondary | ICD-10-CM

## 2021-04-29 DIAGNOSIS — M542 Cervicalgia: Secondary | ICD-10-CM

## 2021-04-29 DIAGNOSIS — M6281 Muscle weakness (generalized): Secondary | ICD-10-CM

## 2021-04-29 DIAGNOSIS — R2689 Other abnormalities of gait and mobility: Secondary | ICD-10-CM

## 2021-04-29 NOTE — Therapy (Addendum)
[facility] [address] [place], [place], [address] Phone: [phone]   Fax:  [phone]  Physical Therapy Treatment/10th Visit Progress Note Patient Details  Name: Mandy Durham MRN: [id number] Date of Birth: [birth year] Referring Provider (PT): [name], [name], MD   10th Visit Physical Therapy Progress Note  Dates of Reporting Period: 02/28/21 to 04/29/21    Encounter Date: 04/29/2021   PT End of Session - 04/29/21 1706     Visit Number 10    Number of Visits 25    Date for PT Re-Evaluation 05/29/21    Authorization Type [employer]    PT Start Time 1318    PT Stop Time 1400    PT Time Calculation (min) 42 min    Activity Tolerance Patient tolerated treatment well    Behavior During Therapy [facility] for tasks assessed/performed             Past Medical History:  Diagnosis Date   Anxiety state, unspecified    Hyperlipidemia    Hypertension    Insomnia    LBBB (left bundle branch block)    LHC in 2002 showed normal coronaries.    Osteoarthrosis, unspecified whether generalized or localized, unspecified site    Overweight(278.02)    S/P placement of cardiac pacemaker    a. 02/21/16: Medtronic Advisa DR MRI SureScan model P5WS56 (serial number [id number])    Serrated adenoma of colon 2007   Symptomatic menopausal or female climacteric states    Syncope and collapse    11/12: Holter 12/12 with rare PACS, HR range 64-140, average 82, no significant arrhythmias. Echo (1/13): EF 50-55%, mild LVH, septal-lateral dyssynchrony c/w LBBB. 3-week event monitor (1/13): No significant arrhythmia.     Past Surgical History:  Procedure Laterality Date   COLONOSCOPY  2007   EP IMPLANTABLE DEVICE N/A 02/21/2016   Procedure: Pacemaker Implant;  Surgeon: Will Meredith Leeds, MD;  Location: [facility];  Service: Cardiovascular;  Laterality: N/A;   POLYPECTOMY      There were no vitals filed for  this visit.   Subjective Assessment - 04/29/21 1323     Subjective Will get the results of the MRI for her cervical and thoracic spine next week. Sees the neurologist next Monday. No pain today. Has some good and bad days with her neck pain.    Pertinent History PMH: Parkinsonism anxiety, HLD, HTN, pacemaker, hx of TBI (2016)    Limitations Walking;House hold activities;Standing    Patient Stated Goals Wants to improve her balance, posture; improve pain    Currently in Pain? No/denies    Pain Onset More than a month ago                [facility] PT Assessment - 04/29/21 1329       AROM   Overall AROM  Deficits    Overall AROM Comments supine rotation: R: 30 deg, L 40 deg    AROM Assessment Site Cervical    Cervical Flexion 35    Cervical Extension 10    Cervical - Right Side Bend 25   starting from 5 deg L lateral flexion   Cervical - Left Side Bend 35   starting from 5 deg L lateral flexion   Cervical - Right Rotation 30    Cervical - Left Rotation 35   starting from 10 deg rotated to L     High Level Balance   High Level Balance Comments push and  release test: anterior: 1 step,  posterior = 2 steps, R lateral = 2 steps, L lateral =3-4 steps                           [facility] Adult PT Treatment/Exercise - 04/29/21 1329       Transfers   Transfers Sit to Stand;Stand to Sit    Sit to Stand 5: Supervision;Without upper extremity assist    Five time sit to stand comments  11.78 seconds, incr hip ADD, performed with no UE support    Stand to Sit 5: Supervision;Without upper extremity assist      Therapeutic Activites    Therapeutic Activities Other Therapeutic Activities    Other Therapeutic Activities Discussed POC going forwards and areas to continue to work on in therapy regarding her posture/ROM/gait/balance - plan to schedule an additional 1x week for 4-6 weeks but pt would like to wait to schedule until after she sees the MD next week about the results from  her MRI, pt still driving to therapy appts (discussed safety concerns at previous session regarding this) and how this will be something pt needs to discuss with her MD at her next appt and seeing if her husband can bring her to appts and when pt does schedule more, scheduling them on times when she can get driven.      Neuro Re-ed    Neuro Re-ed Details  Lateral stepping strategy at countertop with UE support, x8 reps each leg with cued for incr step height, weight shift. Will practice again at next session before adding to HEP.                       PT Short Term Goals - 04/03/21 1728       PT SHORT TERM GOAL #1   Title Pt will be independent in initial HEP with husband supervision in order to build upon functional gains made in therapy. ALL STGS DUE 03/20/21    Baseline pt needs frequent reminders and review of HEP    Time 4    Period Weeks    Status On-going    Target Date 03/30/21      PT SHORT TERM GOAL #2   Title Cervical ROM to be assessed with STG/LTG written as appropriate.    Time 4    Period Weeks    Status Achieved      PT SHORT TERM GOAL #3   Title Pt will decr TUG time to 17 seconds or less in order to demo decr fall risk.    Baseline 22.72 seconds; 13.93 seconds on 04/01/21    Time 4    Period Weeks    Status Achieved      PT SHORT TERM GOAL #4   Title Will perform push and release test with LTG written in order to demo improved balance reactions.    Time 4    Period Weeks    Status Achieved      PT SHORT TERM GOAL #5   Title Pt will improve gait speed to at least 1.5 ft/sec with no AD vs. LRAD in order to demo decr fall risk.    Baseline 1.09 ft/sec with no AD; 2.32 ft/sec on 04/01/21    Time 4    Period Weeks    Status Achieved      PT SHORT TERM GOAL #6   Title improve cervical Rt sidebending to at least  15 deg for improved mobility    Baseline met - 20 deg of lateral flexion to R    Time 3    Period Weeks    Status Achieved    Target Date  03/30/21               PT Long Term Goals - 04/29/21 1326       PT LONG TERM GOAL #1   Title Pt will be independent in final HEP with husband supervision in order to build upon functional gains made in therapy. ALL LTGS DUE 04/27/21    Baseline not performing consistently at home, still needs cues for exercises, pt's husband has not been present in session.    Time 8    Period Weeks    Status Partially Met    Target Date 04/27/21      PT LONG TERM GOAL #2   Title Pt will recover posterior and anterior balance in push and release test 1 robust step independently, for improved balance recovery    Baseline anterior = 1 small delayed step, posterior = 3 small shuffled steps, ON 04/29/21: 2 steps posteriorly, 1 robust anterior step.    Time 8    Period Weeks    Status Partially Met      PT LONG TERM GOAL #3   Title Pt will improve gait speed to at least 2.0 ft/sec with no AD vs. LRAD in order to demo decr fall risk.    Baseline 1.09 ft/sec no AD, 2.32 ft/sec on 04/01/21    Time 8    Period Weeks    Status Achieved      PT LONG TERM GOAL #4   Title Pt will improve 5x sit <> stand to at least 16 seconds or less with no UE support in order to demo improved functional BLE strength.    Baseline 20.71 seconds, no UE support; 11.78 seconds with no UE support on 04/29/21    Time 8    Period Weeks    Status Achieved      PT LONG TERM GOAL #5   Title Pt will ambulate at least 500' over indoor and outdoor unlevel surfaces with supervision with no AD vs. LRAD in order to demo improved community mobility.    Baseline did not get to perform due to time constraints.    Time 8    Period Weeks    Status Deferred      PT LONG TERM GOAL #6   Title report neck pain < 4/10 with activity for improved function    Baseline pt unable to answer or give a number, even after explanation, reports that it varies on the day.    Status Deferred      PT LONG TERM GOAL #7   Title demonstrate improved  postural awareness and decreased episodes of cervical flexion when up amb at least 25% of the time    Status Not Met             Updated/ongoing LTGs for 12 week POC:     PT Long Term Goals - 04/30/21 1205       PT LONG TERM GOAL #1   Title Pt will be independent in final HEP with husband supervision in order to build upon functional gains made in therapy. ALL LTGS DUE 05/28/21    Baseline pt will continue to benefit from review and have pt's husband come back for education.    Time 12  Period Weeks    Status On-going    Target Date 05/28/21      PT LONG TERM GOAL #2   Title Pt will recover posterior and lateral balance in push and release test in 1 robust step independently, for improved balance recovery    Baseline ON 04/29/21: 2 steps posteriorly, 1 robust anterior step. R lateral = 2 steps, L lateral = 3-4 steps    Time 12    Period Weeks    Status New      PT LONG TERM GOAL #3   Title Pt will improve cervical AROM of R rotation to 35 degrees and L rotation to 45 degrees in order to demo improved functional mobility.    Baseline R: 30 deg, L 40 deg    Time 12    Period Weeks    Status New      PT LONG TERM GOAL #5   Title Pt will ambulate at least 200' over indoor and outdoor unlevel surfaces with supervision with no AD vs. LRAD in order to demo improved community mobility.    Time 12    Period Weeks    Status Revised                Plan - 04/30/21 1156     Clinical Impression Statement 10th visit PN: Began to assess pt's LTGs today. Pt has met LTGs #3 and #4 in regard to gait speed and 5x sit <> stand. Pt significantly improved 5x sit <> stand with no UE support. Performed in 11.78 seconds today (previously was 20.71 seconds). Pt partially met LTG #2 - improved in anterior stepping strategy, but still taking a few steps with posterior and lateral. Pt unable to answer LTG #6 in regards to neck pain, however does report improvement in neck pain compared to when  pt first started therapy. Pt to see her neurologist next week for a follow-up visit. Pt does need reminder cues on how to properly perform HEP and asks for clarification during sessions. Pt's husband has not been to a therapy session for incr carryover for home (after therapy asking to see if he can come) Pt will continue to benefit from skilled PT to address posture, cervical ROM and strengthening, gait, balance, functional mobility/transfers. LTGs updated/revised as appropriate for 12 week POC.    Personal Factors and Comorbidities Comorbidity 3+;Past/Current Experience;Time since onset of injury/illness/exacerbation    Comorbidities PMH: Parkinsonism, anxiety, HLD, HTN, pacemaker, syncope with collapse resulting in SAH/TBI 2016, aphasia/stuttering    Examination-Activity Limitations Transfers;Stairs;Stand;Locomotion Level    Examination-Participation Restrictions Community Activity;Cleaning;Laundry;Driving    Stability/Clinical Decision Making Evolving/Moderate complexity    Rehab Potential Fair   due to deficits   PT Frequency 2x / week    PT Duration 12 weeks    PT Treatment/Interventions ADLs/Self Care Home Management;Moist Heat;DME Instruction;Gait training;Stair training;Functional mobility training;Therapeutic activities;Neuromuscular re-education;Balance training;Therapeutic exercise;Patient/family education;Manual techniques;Passive range of motion;Dry needling;Vestibular;Cryotherapy;Electrical Stimulation;Traction;Ultrasound;Taping    PT Next Visit Plan how was neurologist? get scheduled for more visits. Supine and seated isometric neck exercises > try sidelying concentric exercises against gravity. Continue to work on postural/cervical stretching and ROM. Work on functional strengthening, SLS, and stepping strategies.    PT Home Exercise Plan Access Code: [email or web address]    Consulted and Agree with Plan of Care Patient;Family member/caregiver             Patient will benefit from skilled  therapeutic intervention in order to improve the following deficits and impairments:  Abnormal  gait, Decreased activity tolerance, Decreased coordination, Decreased balance, Decreased endurance, Decreased range of motion, Decreased strength, Difficulty walking, Impaired flexibility, Increased muscle spasms, Impaired sensation, Postural dysfunction, Pain, Decreased mobility, Decreased knowledge of use of DME, Impaired tone, Increased fascial restricitons, Impaired UE functional use  Visit Diagnosis: Abnormal posture  Cervicalgia  Muscle weakness (generalized)  Other abnormalities of gait and mobility  Unsteadiness on feet     Problem List Patient Active Problem List   Diagnosis Date Noted   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   Functional dyspepsia 09/08/2017   Slow transit constipation 09/08/2017   S/P placement of cardiac pacemaker    [place] II 02/20/2016   Nystagmus, end-position 02/12/2016   Dizziness and giddiness 02/12/2016   Flapping tremor 02/12/2016   Chronic fatigue 10/28/2015   PCP NOTES >>>> 02/13/2015   TBI (traumatic brain injury) 01/28/2015   Essential hypertension    Acute post-traumatic headache, not intractable    Skull fracture (HCC)    Vertigo due to brain injury    SAH (subarachnoid hemorrhage) ([place]) 01/21/2015   Annual physical exam 09/07/2014   Orthostatic hypotension 08/30/2014   Acromioclavicular arthrosis 03/21/2014   Allergic rhinitis 02/27/2014   SI (sacroiliac) joint dysfunction 02/02/2014   Gastrocnemius strain, left 12/11/2013   Back pain 08/22/2013   LBBB (left bundle branch block) 05/13/2013   Shingles outbreak 12/15/2012   Borderline diabetes    Insomnia 01/12/2008   HLD (hyperlipidemia) 02/04/2007   Anxiety 10/12/2006   Osteoarthritis 10/12/2006    Arliss Journey, PT, DPT  04/30/2021, 12:05 PM  [facility] [address] [place], [place], [address] Phone:  [phone]   Fax:  [phone]  Name: Sherel JAKEIRA SEEMAN MRN: [id number] Date of Birth: [birth year]

## 2021-04-30 ENCOUNTER — Ambulatory Visit (INDEPENDENT_AMBULATORY_CARE_PROVIDER_SITE_OTHER): Payer: PPO | Admitting: *Deleted

## 2021-04-30 DIAGNOSIS — I10 Essential (primary) hypertension: Secondary | ICD-10-CM

## 2021-04-30 DIAGNOSIS — R739 Hyperglycemia, unspecified: Secondary | ICD-10-CM

## 2021-04-30 DIAGNOSIS — R7303 Prediabetes: Secondary | ICD-10-CM

## 2021-04-30 DIAGNOSIS — F419 Anxiety disorder, unspecified: Secondary | ICD-10-CM

## 2021-04-30 DIAGNOSIS — S0219XS Other fracture of base of skull, sequela: Secondary | ICD-10-CM

## 2021-04-30 DIAGNOSIS — R42 Dizziness and giddiness: Secondary | ICD-10-CM

## 2021-04-30 DIAGNOSIS — G44319 Acute post-traumatic headache, not intractable: Secondary | ICD-10-CM

## 2021-04-30 DIAGNOSIS — R627 Adult failure to thrive: Secondary | ICD-10-CM

## 2021-04-30 DIAGNOSIS — G214 Vascular parkinsonism: Secondary | ICD-10-CM

## 2021-04-30 DIAGNOSIS — M19012 Primary osteoarthritis, left shoulder: Secondary | ICD-10-CM

## 2021-04-30 DIAGNOSIS — I609 Nontraumatic subarachnoid hemorrhage, unspecified: Secondary | ICD-10-CM

## 2021-04-30 DIAGNOSIS — S069X2S Unspecified intracranial injury with loss of consciousness of 31 minutes to 59 minutes, sequela: Secondary | ICD-10-CM

## 2021-04-30 DIAGNOSIS — R5382 Chronic fatigue, unspecified: Secondary | ICD-10-CM

## 2021-04-30 NOTE — Progress Notes (Signed)
Remote pacemaker transmission.   

## 2021-05-02 NOTE — Patient Instructions (Signed)
Visit Information   Thank you for taking time to visit with me today. Please don't hesitate to contact me if I can be of assistance to you before our next scheduled telephone appointment.  Following are the goals we discussed today:  Patient Goals/Self-Care Activities:  Patient will work with LCSW on a bi-weekly basis, until approved for [employer], or until services are in place through a private agency of choice.   Patient agreed to periodically follow-up with Rosealee Albee, Social Worker II with the [place] [facility] [phone]), to check the status of her name on the waiting list to receive In-Home Aide Services.   Patient will review the following list of agencies and resources, and begin contacting agencies of interest, to check pricing, availability, credentialing, etc.       ~ [place] ~ [place] ~ [place] Providers ~ [place] Patient will receive the following brochures, to become more knowledgeable about resources, services, and activities offered to seniors in her community: ~ Development worker, community of [employer] ~ [employer] of [employer] Patient will consider arranging Adult Day [employer] for husband, from the list provided. Patient encouraged to contact LCSW (# [phone]) directly, if she has questions, needs assistance, or if additional social work needs are identified between now and our next scheduled telephone outreach call.  Follow-Up Plan:  LCSW will follow-up with patient by telephone on 05/06/2021 at 1:30 pm.  Please call the care guide team at [phone] if you need to cancel or reschedule your appointment.   If you are experiencing a Mental Health or [place] or need someone to talk to, please call the Suicide and Crisis Lifeline: 988 call the [place] National Suicide  Prevention Lifeline: 618-710-7913 or TTY: 657-492-7291 TTY 2164901604) to talk to a trained counselor call 1-800-273-TALK (toll free, 24 hour hotline) go to [facility] [place] [phone]) call the [facility]: [phone] call 911   Following is a copy of your full care plan:  Care Plan : LCSW Plan of Care  Updates made by Francis Gaines, LCSW since 05/02/2021 12:00 AM     Problem: Find Help in My Community.   Priority: High     Goal: Find Help in My Community.   Start Date: 04/30/2021  Expected End Date: 07/28/2021  This Visit's Progress: On track  Priority: High  Note:   Current Barriers:   Patient with Hypertension, Vascular Parkinsonism, Traumatic Brain Injury, Subarachnoid Hemorrhage, Skull Fracture, Dizziness, Giddiness, Anxiety, and Chronic Fatigue, needs Support, Education, Resources, Referrals, Advocacy, and Care Coordination, to resolve unmet personal care needs in the home. Patient is unable to self-administer medications as prescribed, or consistently perform ADL's/IADL's independently. Lacks knowledge of available community agencies and resources. Clinical Goals:  Patient will have [place] in place, either through [facility], Education officer, museum II with the [place] Program, or through a private agency of choice. Patient will work with LCSW and Rosealee Albee, Education officer, museum II with the [place] Program, to coordinate care for [employer]. Patient will demonstrate improved health management independence as evidenced by having [place] in place. Interventions: Collaboration with Primary Care Provider, Dr. Kathlene November regarding development and update of comprehensive plan of  care as evidenced by provider attestation and  co-signature. Inter-disciplinary care team collaboration (see longitudinal plan of care). Identified resources and durable medical equipment needed in the home to improve safety and promote independence.  Clinical Interventions: Patient interviewed and appropriate assessments performed. Problem Solving/Task-Centered Solutions Developed. Health Education Discussed. Quality of Sleep Assessed and Sleep Hygiene Techniques Promoted. Caregiver Stress Acknowledged and Consideration of In-Home Aide Services Encouraged. Emotional Support Provided and Verbalization of Feelings Emphasized. Patient will receive, review, and consider arranging [place] through a private agency of choice, from the list provided, if she is not eligible to receive In-Home Aide Services through [employer], Education officer, museum II with the [place] Program. Referral placed to [employer], Education officer, museum II with the [place], on behalf of patient. LCSW collaboration with Rosealee Albee, Social Worker II with the [place] Program, to verify receipt of completed and signed application, as well as to confirm position on waiting list.   Discussed plans with patient for ongoing care management follow-up and provided direct contact information for care management team. Assisted patient with obtaining information about health plan benefits through [employer], and confirmed ineligibility for [employer] and Attendance Benefits, or active long-term care insurance policies.   Provided education to patient regarding level of care options (I.e Senior Living, Assisted Living, Memory Care), but she would like for her and her husband to be able to reside in their own home for as long as possible. Assessed needs, level  of care concerns, basic eligibility and provided education on [employer] application process. Patient Goals/Self-Care Activities:  Patient will work with LCSW on a bi-weekly basis, until approved for [employer], or until services are in place through a private agency of choice.   Patient agreed to periodically follow-up with Rosealee Albee, Social Worker II with the [place] [facility] [phone]), to check the status of her name on the waiting list to receive In-Home Aide Services.   Patient will review the following list of agencies and resources, and begin contacting agencies of interest, to check pricing, availability, credentialing, etc.       ~ [place] ~ [place] ~ [place] [facility] ~ [place] Patient will receive the following brochures, to become more knowledgeable about resources, services, and activities offered to seniors in her community: ~ Development worker, community of [employer] ~ [employer] of the [employer] Patient will consider arranging Adult Day [employer] for husband, from the list provided. Patient encouraged to contact LCSW (# [phone]) directly, if she has questions, needs assistance, or if additional social work needs are identified between now and our next scheduled telephone outreach call.  Follow-Up Plan:  LCSW will follow-up with patient by telephone on 05/06/2021 at 1:30 pm.         Consent to CCM Services: Ms. Iman was given information about Chronic Care Management services including:  CCM service includes personalized support from designated clinical staff supervised by her physician, including individualized plan of care and coordination with other care providers 24/7 contact phone numbers for assistance for urgent and routine care needs. Service will only be billed when  office clinical staff spend 20 minutes or more in a month to coordinate care.  Only one practitioner may furnish and bill the service in a calendar month. The patient may stop CCM services at any time (effective at the end of the month) by phone call to the office staff. The patient will be responsible for cost sharing (co-pay) of up to 20% of the service fee (after annual deductible is met).  Patient agreed to services and verbal consent obtained.   Patient verbalizes understanding of instructions and care plan provided today and agrees to view in [place]. Active MyChart status confirmed with patient.    Telephone follow up appointment with care management team member scheduled for:  05/06/2021 at 1:30 pm.  Nat Christen Arboles Licensed Clinical Social Worker [place] [phone]

## 2021-05-02 NOTE — Chronic Care Management (AMB) (Addendum)
Chronic Care Management    Clinical Social Work Note  05/02/2021 Name: Mandy Durham MRN: [id number] DOB: [birth year]  Mandy Durham is a 76 y.o. year old female who is a primary care patient of Larose Kells, Alda Berthold, MD. The CCM team was consulted to assist the patient with chronic disease management and/or care coordination needs related to: [employer], Level of Care Concerns, and Caregiver Stress.   Engaged with patient by telephone for initial visit in response to provider referral for social work chronic care management and care coordination services.   Consent to Services:  The patient was given information about Chronic Care Management services, agreed to services, and gave verbal consent prior to initiation of services.  Please see initial visit note for detailed documentation.   Patient agreed to services and consent obtained.   Assessment: Review of patient past medical history, allergies, medications, and health status, including review of relevant consultants reports was performed today as part of a comprehensive evaluation and provision of chronic care management and care coordination services.     SDOH (Social Determinants of Health) assessments and interventions performed:  SDOH Interventions    Flowsheet Row Most Recent Value  SDOH Interventions   Food Insecurity Interventions Intervention Not Indicated  Financial Strain Interventions Intervention Not Indicated  Housing Interventions Intervention Not Indicated  Intimate Partner Violence Interventions Intervention Not Indicated  Physical Activity Interventions Intervention Not Indicated, Patient Refused  Stress Interventions Offered [employer] Resources, Provide Counseling, Patient Refused  Social Connections Interventions Intervention Not Indicated  Transportation Interventions Intervention Not Indicated        Advanced Directives Status: Not ready or willing to discuss.  CCM Care Plan  No Known  Allergies  Outpatient Encounter Medications as of 04/30/2021  Medication Sig   acetaminophen (TYLENOL) 500 MG tablet Take 500 mg by mouth every 6 (six) hours as needed.   aspirin EC 81 MG tablet Take 81 mg by mouth daily. Swallow whole.   budesonide-formoterol (SYMBICORT) 160-4.5 MCG/ACT inhaler Inhale 2 puffs into the lungs 2 (two) times daily.   carbidopa-levodopa (SINEMET IR) 25-100 MG tablet Take 1 tablet by mouth 4 (four) times daily.   Cholecalciferol (VITAMIN D PO) Take 1 tablet by mouth 2 (two) times a week. Vitamin D   escitalopram (LEXAPRO) 10 MG tablet Take 1 tablet (10 mg total) by mouth daily.   ezetimibe (ZETIA) 10 MG tablet Take 1 tablet (10 mg total) by mouth daily.   furosemide (LASIX) 20 MG tablet Take 1 tablet (20 mg total) by mouth daily. Alternating 40 mg every other day   losartan (COZAAR) 100 MG tablet Take 1 tablet (100 mg total) by mouth daily.   nitroGLYCERIN (NITROSTAT) 0.4 MG SL tablet Place 1 tablet (0.4 mg total) under the tongue every 5 (five) minutes x 3 doses as needed for chest pain.   No facility-administered encounter medications on file as of 04/30/2021.    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   Functional dyspepsia 09/08/2017   Slow transit constipation 09/08/2017   S/P placement of cardiac pacemaker    [place] II 02/20/2016   Nystagmus, end-position 02/12/2016   Dizziness and giddiness 02/12/2016   Flapping tremor 02/12/2016   Chronic fatigue 10/28/2015   PCP NOTES >>>> 02/13/2015   TBI (traumatic brain injury) 01/28/2015   Essential hypertension    Acute post-traumatic headache, not intractable    Skull fracture (HCC)    Vertigo due to brain  injury    SAH (subarachnoid hemorrhage) ([place]) 01/21/2015   Annual physical exam 09/07/2014   Orthostatic hypotension 08/30/2014   Acromioclavicular arthrosis 03/21/2014   Allergic rhinitis 02/27/2014   SI (sacroiliac) joint dysfunction 02/02/2014    Gastrocnemius strain, left 12/11/2013   Back pain 08/22/2013   LBBB (left bundle branch block) 05/13/2013   Shingles outbreak 12/15/2012   Borderline diabetes    Insomnia 01/12/2008   HLD (hyperlipidemia) 02/04/2007   Anxiety 10/12/2006   Osteoarthritis 10/12/2006    Conditions to be addressed/monitored: HTN and Vascular Parkinsonism.  Limited Social Support, Level of Care Concerns, ADL/IADL Limitations, Social Isolation, Limited Access to Caregiver, Cognitive Deficits, Memory Deficits, and Lacks Knowledge of [employer].  Care Plan : LCSW Plan of Care  Updates made by Francis Gaines, LCSW since 05/02/2021 12:00 AM     Problem: Find Help in My Community.   Priority: High     Goal: Find Help in My Community.   Start Date: 04/30/2021  Expected End Date: 07/28/2021  This Visit's Progress: On track  Priority: High  Note:   Current Barriers:   Patient with Hypertension, Vascular Parkinsonism, Traumatic Brain Injury, Subarachnoid Hemorrhage, Skull Fracture, Dizziness, Giddiness, Anxiety, and Chronic Fatigue, needs Support, Education, Resources, Referrals, Advocacy, and Care Coordination, to resolve unmet personal care needs in the home. Patient is unable to self-administer medications as prescribed, or consistently perform ADL's/IADL's independently. Lacks knowledge of available community agencies and resources. Clinical Goals:  Patient will have [place] in place, either through [facility], Education officer, museum II with the [place] Program, or through a private agency of choice. Patient will work with LCSW and Rosealee Albee, Education officer, museum II with the [place] Program, to coordinate care for [employer]. Patient will demonstrate improved health management independence as evidenced by having [place] in  place. Interventions: Collaboration with Primary Care Provider, Dr. Kathlene November regarding development and update of comprehensive plan of care as evidenced by provider attestation and co-signature. Inter-disciplinary care team collaboration (see longitudinal plan of care). Identified resources and durable medical equipment needed in the home to improve safety and promote independence.  Clinical Interventions: Patient interviewed and appropriate assessments performed. Problem Solving/Task-Centered Solutions Developed. Health Education Discussed. Quality of Sleep Assessed and Sleep Hygiene Techniques Promoted. Caregiver Stress Acknowledged and Consideration of In-Home Aide Services Encouraged. Emotional Support Provided and Verbalization of Feelings Emphasized. Patient will receive, review, and consider arranging [place] through a private agency of choice, from the list provided, if she is not eligible to receive In-Home Aide Services through [facility], Education officer, museum II with the [place] Program. Referral placed to [facility], Education officer, museum II with the [place], on behalf of patient. LCSW collaboration with Rosealee Albee, Social Worker II with the [place] Program, to verify receipt of completed and signed application, as well as to confirm position on waiting list.   Discussed plans with patient for ongoing care management follow-up and provided direct contact information for care management team. Assisted patient with obtaining information about health plan benefits through [employer] Medicare, and confirmed ineligibility for [employer] and Attendance Benefits, or active long-term care insurance policies.   Provided education to patient regarding level of care options  (  I.e Senior Living, Assisted Living, Memory Care), but she would like for her and her husband to be able to reside in their own home for as long as possible. Assessed needs, level of care concerns, basic eligibility and provided education on [employer] application process. Patient Goals/Self-Care Activities:  Patient will work with LCSW on a bi-weekly basis, until approved for [employer], or until services are in place through a private agency of choice.   Patient agreed to periodically follow-up with Rosealee Albee, Social Worker II with the [place] Program [phone]), to check the status of her name on the waiting list to receive In-Home Aide Services.   Patient will review the following list of agencies and resources, and begin contacting agencies of interest, to check pricing, availability, credentialing, etc.       ~ [place] ~ [place] ~ [place] Providers ~ [place] Patient will receive the following brochures, to become more knowledgeable about resources, services, and activities offered to seniors in her community: ~ Development worker, community of [employer] ~ [employer] of the [employer] Patient will consider arranging Adult Day [employer] for husband, from the list provided. Patient encouraged to contact LCSW (# [phone]) directly, if she has questions, needs assistance, or if additional social work needs are identified between now and our next scheduled telephone outreach call.  Follow-Up Plan:  LCSW will follow-up with patient by telephone on 05/06/2021 at 1:30 pm.  Nat Christen LCSW Licensed Clinical Social Worker [place] [phone]    I have reviewed and agree with [facility] [employer] documentation.  Kathlene November, MD

## 2021-05-05 ENCOUNTER — Telehealth: Payer: Self-pay | Admitting: Neurology

## 2021-05-05 ENCOUNTER — Ambulatory Visit: Payer: PPO | Admitting: Neurology

## 2021-05-05 ENCOUNTER — Ambulatory Visit: Payer: PPO | Admitting: Adult Health

## 2021-05-05 ENCOUNTER — Encounter: Payer: Self-pay | Admitting: Neurology

## 2021-05-05 VITALS — BP 130/58 | HR 51 | Ht 64.0 in | Wt 110.0 lb

## 2021-05-05 DIAGNOSIS — G2401 Drug induced subacute dyskinesia: Secondary | ICD-10-CM

## 2021-05-05 DIAGNOSIS — S06362A Traumatic hemorrhage of cerebrum, unspecified, with loss of consciousness of 31 minutes to 59 minutes, initial encounter: Secondary | ICD-10-CM | POA: Insufficient documentation

## 2021-05-05 DIAGNOSIS — F8081 Childhood onset fluency disorder: Secondary | ICD-10-CM | POA: Diagnosis not present

## 2021-05-05 DIAGNOSIS — M479 Spondylosis, unspecified: Secondary | ICD-10-CM

## 2021-05-05 DIAGNOSIS — M40209 Unspecified kyphosis, site unspecified: Secondary | ICD-10-CM

## 2021-05-05 DIAGNOSIS — F909 Attention-deficit hyperactivity disorder, unspecified type: Secondary | ICD-10-CM | POA: Diagnosis not present

## 2021-05-05 DIAGNOSIS — S06342S Traumatic hemorrhage of right cerebrum with loss of consciousness of 31 minutes to 59 minutes, sequela: Secondary | ICD-10-CM | POA: Diagnosis not present

## 2021-05-05 MED ORDER — CARBIDOPA-LEVODOPA 10-100 MG PO TABS: 1.0000 | ORAL_TABLET | Freq: Four times a day (QID) | ORAL | 5 refills | Status: DC

## 2021-05-05 NOTE — Telephone Encounter (Signed)
HTA order sent to GI, [facility] they will reach out to the patient to schedule.  

## 2021-05-05 NOTE — Progress Notes (Signed)
SLEEP MEDICINE CLINIC   Provider:  Larey Seat, M D  Referring Provider: Colon Branch, MD Primary Care Physician:  Colon Branch, MD      02/13/21: MM: Mandy Durham is a 76 year old female with a history of tremor.  She returns today for follow-up.  In reviewing Dr. Edwena Felty original note ( referred by Rudean Curt schaick)  it was questionable whether the patient had essential tremor versus vascular parkinsonism?   Patient returns today reporting that tremor is worse.  While sitting in the exam room she has a significant tremor at rest in the left hand and both legs.  The patient states that her legs feel weak.  She does use a walker at home.  Denies any recent falls.  Denies any trouble chewing or swallowing food.  She has not taken her Sinemet this morning.  She is not sure how much the tremor improves after taking Sinemet as she reports she has never paid attention to this.  She returns today for an evaluation.  10/28/20: Mandy Durham is a 76 year old female with a history of tremor.  She returns today for follow-up.  She feels that her tremor is worse.  She states that it occurs mainly with activity.  Primarily in the hands.  She is able to complete all ADLs independently.  She remains on Sinemet 25-100 mg 3 times a day.  Denies any significant changes with her walking.  Does report that she has some back pain and that changes her gait slightly.  She states that her speech is worse.  She has noticed more stuttering.  The patient also has several other complaints during today's visit.  States that she has been depressed.  Her PCP started her on 2 medications but she is no longer on those.  Also notices a decline in her memory, lack of energy and feeling tired throughout the day.  She returns today for an evaluation.  10/26/19: Mandy Durham is a 76 year old female with a history of tremor.  She returns today for follow-up.  She feels that her tremor has remained relatively stable.  She states that  she has been under more stress recently and does feel exhausted.  She continues on Sinemet 3 times a day.  Denies any significant changes with her gait.  Denies any falls.  She continues to have trouble with expressive aphasia.  She returns today for follow-up.   History of Present Illness:  HPI:  Mandy Durham is a 76 y.o. female , seen here as a referral  from Dr. Kathlene November for a subarachnoid traumatic bleed , sequelae evaluation.   Mandy Durham had originally asked for this appointment due to incontinence following a SAH. She had fainted, fell and aquiered the TBI with SAH. She states that the incontinence  is improved. She still has some urine leakage and soft stools. She wonders if this is still a neurologic impairment from the [facility]. She  has no pain with bowel movements, currently no headaches or nausea. She remains on Mestinon to protect her from fainting spells.  Patient was last seen in April 2019 by Venancio Poisson, NP.  She had tried carbidopa - Ldopa, and it had initially helped her tremors, which I believed to be essential tremor.  She now reports that the tremor is no longer controlled. She is on bid Sinemet 25/ 100 mg.     Observations/Objective: no dysphonia, no vocal tremor.    Assessment and Plan: increase to TID sinemet, 30  minutes before mealtime. The patient is not diabetic, no history of gastroparesis.    Follow Up Instructions: patient will call in 3 month and report of observation, effect on tremor.  RV with Dory Peru, NP  in alternating fashion.     I discussed the assessment and treatment plan with the patient. The patient was provided an opportunity to ask questions and all were answered. The patient agreed with the plan and demonstrated an understanding of the instructions.   The patient was advised to call back or seek an in-person evaluation if the symptoms worsen or if the condition fails to improve as anticipated.  I provided 12 minutes of  non-face-to-face time during this encounter.   Larey Seat, MD    HPI:  Mandy Durham is a 76 y.o. female , seen here as a referral  from Dr. Kathlene November for a subarachnoid traumatic bleed , sequelae evaluation.   Mandy Durham had originally asked for this appointment due to incontinence following a SAH. She had fainted, fell and aquiered the TBI with SAH. She states that the incontinence  is improved. She still has some urine leakage and soft stools. She wonders if this is still a neurologic impairment from the [facility]. She  has no pain with bowel movements, currently no headaches or nausea. She remains on Mestinon to protect her from fainting spells.  History from 02/12/2016 CD: I have originally seen Mandy Durham for one visit in February of 2016,  She has continued to have difficulties since suffering a sub-arachnoid traumatic hemorrhage. She states that ever since she had the fall she has sometimes leaked nasal clear fluid and this discharge had increased over the last couple of month and weeks. She also has more headaches. Her primary care physician, Dr. past, called me yesterday and asked if I could see her on an urgent basis. There is another very evident development : she has a significant tremor in both upper extremities and she sits here with her legs crossed and holds her left hand with her right to suppress the amplitude of movement. The subarachnoid hemorrhage affected the right brain, her trauma begun in the left hand over the last 3 months it has exacerbated to involve both upper extremities and it has a flapping and sometimes pill-rolling movement characteristic. There was no rigor over her biceps and her last visit. Her headaches are located right between the eyes above the root of the nose and she feels dizzy and lightheaded. The dizziness is a new phenomenon, the headaches preceded the dizziness. Some of her headaches are associated with nausea, the headaches do not radiate to other parts  of the face or skull, she does not have visual changes with headaches. She does not have photophobia. She does not wake up with these headaches.   Interval history 06/14/17: Patient is being seen today for follow-up medication refill appointment.  Patient has been doing well without noticeable action or resting tremors.  She does state when she is walking at times she will feel her hands bilaterally start to tremor but this is only on occasion and is mild.  She continues to take Sinemet 25-100 3 times a day without side effects.  She has been having multiple stomach issues regarding nausea but has been is being followed by a GI specialist.  Denies dizziness, lightheadedness or headaches.  No new concerns at today's appointment and would like a one-year refill for her Sinemet.   Review of Systems: Out of a  complete 14 system review, the patient complains of only the following symptoms, and all other reviewed systems are negative.  Tremors   Social History   Socioeconomic History   Marital status: Married    Spouse name: Eddie   Number of children: 2   Years of education: 12   Highest education level: 12th grade  Occupational History   Occupation: retired Environmental consultant: [employer]  Tobacco Use   Smoking status: Never    Passive exposure: Never   Smokeless tobacco: Never  Vaping Use   Vaping Use: Never used  Substance and Sexual Activity   Alcohol use: No   Drug use: No   Sexual activity: Not Currently  Other Topics Concern   Not on file  Social History Narrative   HSG.  Married '65.     2 daughters, '71, '77; 4 grandchildren   Lives with husband   Right handed   Caffeine: half -1 cup a coffee a day         Social Determinants of Health   Financial Resource Strain: Low Risk    Difficulty of Paying Living Expenses: Not hard at all  Food Insecurity: No Food Insecurity   Worried About Charity fundraiser in the Last Year: Never true   [place] in the Last Year: Never true  Transportation Needs: No Transportation Needs   Lack of Transportation (Medical): No   Lack of Transportation (Non-Medical): No  Physical Activity: Inactive   Days of Exercise per Week: 0 days   Minutes of Exercise per Session: 0 min  Stress: Stress Concern Present   Feeling of Stress : Rather much  Social Connections: Moderately Integrated   Frequency of Communication with Friends and Family: More than three times a week   Frequency of Social Gatherings with Friends and Family: Three times a week   Attends Religious Services: 1 to 4 times per year   Active Member of Clubs or Organizations: No   Attends Archivist Meetings: Never   Marital Status: Married  Human resources officer Violence: Not At Risk   Fear of Current or Ex-Partner: No   Emotionally Abused: No   Physically Abused: No   Sexually Abused: No    Family History  Problem Relation Age of Onset   Heart failure Mother    Coronary artery disease Mother    Dementia Mother    Diabetes Mother    Colon cancer Neg Hx    Breast cancer Neg Hx    Hypertension Neg Hx    Heart disease Neg Hx    Heart attack Neg Hx    Stroke Neg Hx    Rectal cancer Neg Hx    Stomach cancer Neg Hx    Esophageal cancer Neg Hx    Liver disease Neg Hx     Past Medical History:  Diagnosis Date   Anxiety state, unspecified    Hyperlipidemia    Hypertension    Insomnia    LBBB (left bundle branch block)    LHC in 2002 showed normal coronaries.    Osteoarthrosis, unspecified whether generalized or localized, unspecified site    Overweight(278.02)    Parkinson disease ([place])    S/P placement of cardiac pacemaker    a. 02/21/16: Medtronic Advisa DR MRI SureScan model [id number] (serial number [id number])    Serrated adenoma of colon 2007   Symptomatic menopausal or female climacteric states    Syncope and  collapse    11/12: Holter 12/12 with rare PACS, HR range 64-140, average 82, no significant  arrhythmias. Echo (1/13): EF 50-55%, mild LVH, septal-lateral dyssynchrony c/w LBBB. 3-week event monitor (1/13): No significant arrhythmia.     Past Surgical History:  Procedure Laterality Date   COLONOSCOPY  2007   EP IMPLANTABLE DEVICE N/A 02/21/2016   Procedure: Pacemaker Implant;  Surgeon: Will Meredith Leeds, MD;  Location: [facility];  Service: Cardiovascular;  Laterality: N/A;   POLYPECTOMY      Current Outpatient Medications  Medication Sig Dispense Refill   acetaminophen (TYLENOL) 500 MG tablet Take 500 mg by mouth every 6 (six) hours as needed.     aspirin EC 81 MG tablet Take 81 mg by mouth daily. Swallow whole.     budesonide-formoterol (SYMBICORT) 160-4.5 MCG/ACT inhaler Inhale 2 puffs into the lungs 2 (two) times daily. 1 each 2   carbidopa-levodopa (SINEMET IR) 25-100 MG tablet Take 1 tablet by mouth 4 (four) times daily. 360 tablet 3   Cholecalciferol (VITAMIN D PO) Take 1 tablet by mouth 2 (two) times a week. Vitamin D     escitalopram (LEXAPRO) 10 MG tablet Take 1 tablet (10 mg total) by mouth daily. 30 tablet 6   ezetimibe (ZETIA) 10 MG tablet Take 1 tablet (10 mg total) by mouth daily. 90 tablet 1   furosemide (LASIX) 20 MG tablet Take 1 tablet (20 mg total) by mouth daily. Alternating 40 mg every other day 90 tablet 3   losartan (COZAAR) 100 MG tablet Take 1 tablet (100 mg total) by mouth daily. 30 tablet 3   nitroGLYCERIN (NITROSTAT) 0.4 MG SL tablet Place 1 tablet (0.4 mg total) under the tongue every 5 (five) minutes x 3 doses as needed for chest pain. 25 tablet 1   No current facility-administered medications for this visit.    Allergies as of 05/05/2021   (No Known Allergies)    Vitals: BP (!) 130/58    Pulse (!) 51    Ht 5\' 4"  (1.626 m)    Wt 110 lb (49.9 kg)    BMI 18.88 kg/m  Last Weight:  Wt Readings from Last 1 Encounters:  05/05/21 110 lb (49.9 kg)   GMW:NUUV mass index is 18.88 kg/m.     Last Height:   Ht Readings from Last 1 Encounters:   05/05/21 5\' 4"  (1.626 m)    Physical exam:  General: The patient is awake, alert and appears not in acute distress. The patient is well groomed.  Pleasant elderly Caucasian female. Head: Normocephalic, atraumatic.  High grade Retrognathia is seen.  Cardiovascular:  Regular rate and rhythm, without  murmurs or carotid bruit, and without distended neck veins. Respiratory: Lungs are clear to auscultation. Skin:  Without evidence of edema, or rash Trunk: BMI is normal  The patient's posture is erect  Neurologic exam : The patient is awake and alert, oriented to place and time.   Memory subjective described as intact.  Attention span & concentration ability appears normal.  Speech is mild stuttering, non fluent,  without dysarthria, dysphonia .  Mood and affect are appropriate.  Cranial nerves: Pupils are equal and briskly reactive to light.  Funduscopic exam without evidence of pallor or edema.  Visual fields by finger perimetry are intact. Hearing to finger rub intact.   Facial sensation intact to fine touch.  Facial motor strength is symmetric and tongue and uvula move midline. Shoulder shrug was symmetrical.   Motor exam:  5/5 strength in all 4 extremities  Sensory:  Fine touch, pinprick and vibration, Proprioception normal.  Coordination: Rapid alternating movements in the fingers/hands was normal. Finger-to-nose maneuver; no tremor observed  Gait and station: Gait is smooth with normal stride.  No tremors observed while ambulating.  Stance is normal  Deep tendon reflexes: in the  upper and lower extremities are symmetric and intact.     Assessment:  Mandy Durham is a 76 year old female with a history of SAH, TBI, HLD, HTN and tremor. Patient was on a beta blocker, benzodiaepine, topiramate and mysoline for her tremors. She was unable to tolerate mysoline and was started on Sinemet 25/100 TID.  She returns today for medication refill appointment has been doing well overall  without observed tremors at today's appointment.    Plan:   -Continue sinemet 25-100 three times per day -Continue to stay active and eat healthy -Follow up in 1 year or call earlier if needed with Jinny Blossom, NP -Advised patient to speak to PCP regarding taking aspirin 81 mg daily for stroke prevention as patient does have HLD and HTN   Greater than 50% time during this 25  minute consultation visit was spent on counseling and coordination of care about tremors and use of Sinemet.  Educated on importance of exercise and maintaining a healthy diet.  Patient has no questions or concerns at today's appointment.  Advised patient to  Venancio Poisson, [facility]  [facility] [address] [place], [place] [address]  Phone [phone] Fax [phone]

## 2021-05-05 NOTE — Progress Notes (Addendum)
SLEEP MEDICINE CLINIC    Provider:  Larey Seat, MD  Primary Care Physician:  Colon Branch, [place] Barbette [address] [place] [address]     Referring Provider: Kary Kos, Md [address] [place],  [place] [address]          Chief Complaint according to patient   Patient presents with:     New Patient (Initial Visit)           HISTORY OF PRESENT ILLNESS:  Mandy Durham is a 76 y.o. year old White or Caucasian female patient seen here as a referral on 05/05/2021 from  her neurosurgeon Dr Saintclair Halsted. She has been followed by dr. Saintclair Halsted for  neck spine injuries, possible myelomalacia, and a TBI, right frontal intracerebral hemorrhage in 2016.   This patient has been on and off followed at [facility] since 2014, when she was seen by Dr Jannifer Franklin for a syncope.  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 When she ran out of meds in dec 2019 she reported increase in tremor amplitude. Has been on sinemet  with less and less effect.  Feels increasingly incapacitated,  .  Chief concern according to patient :  "Dr Saintclair Halsted sends me for Parkinson's disease."    Mandy Durham has a past medical history of Anxiety state, unspecified, Hyperlipidemia, Hypertension, Insomnia, LBBB (left bundle branch block), Osteoarthrosis, unspecified whether generalized or localized, unspecified site, Overweight(278.02), Parkinson disease ([place]), S/P placement of cardiac pacemaker, Serrated adenoma of colon (2007), Symptomatic menopausal or female climacteric states, and Syncope and collapse. Fall related TB injuries in 01-2015.   02/13/21:  Mandy Durham is a 77 year old female with a history of  TBI and tremor.  She returns today for follow-up.  In reviewing Dr. Edwena Felty original note it was questionable whether the patient had essential tremor versus vascular parkinsonism?   Patient returns today reporting that tremor is worse.  While sitting in the exam  room she has a significant tremor at rest in the left hand and both legs.  The patient states that her legs feel weak.  She does use a walker at home.  Denies any recent falls.  Denies any trouble chewing or swallowing food.  She has not taken her Sinemet this morning.  She is not sure how much the tremor improves after taking Sinemet as she reports she has never paid attention to this.  She returns today for an evaluation.  10/28/20: Mandy Durham is a 76 year old female with a history of tremor.  She returns today for follow-up.  She feels that her tremor is worse.  She states that it occurs mainly with activity.  Primarily in the hands.  She is able to complete all ADLs independently.  She remains on Sinemet 25-100 mg 3 times a day.  Denies any significant changes with her walking.  Does report that she has some back pain and that changes her gait slightly.  She states that her speech is worse.  She has noticed more stuttering.  The patient also has several other complaints during today's visit.  States that she has been depressed.  Her PCP started her on 2 medications but she is no longer on those.  Also notices a decline in her memory, lack of energy and feeling tired throughout the day.  She returns today for an evaluation.  10/26/19: Mandy Durham is a 76 year old female with  a history of tremor.  She returns today for follow-up.  She feels that her tremor has remained relatively stable.  She states that she has been under more stress recently and does feel exhausted.  She continues on Sinemet 3 times a day.  Denies any significant changes with her gait.  Denies any falls.  She continues to have trouble with expressive aphasia.  She returns today for follow-up.  HISTORY (Copied from Dr.Yaiden Yang's note) Mandy Durham had originally asked for this appointment due to incontinence following a SAH. She had fainted, fell and aquiered the TBI with SAH. She states that the incontinence  is improved. She still has some  urine leakage and soft stools. She wonders if this is still a neurologic impairment from the [facility]. She  has no pain with bowel movements, currently no headaches or nausea. She remains on Mestinon to protect her from fainting spells.  Patient was last seen in April 2019 by Venancio Poisson, NP.  She had tried carbidopa - Ldopa, and it had initially helped her tremors, which I believed to be essential tremor.  She now reports that the tremor is no longer controlled. She is on bid Sinemet 25/ 100 mg.         Review of Systems: Out of a complete 14 system review, the patient complains of only the following symptoms, and all other reviewed systems are negative.:  Fatigue, sleepiness , memory loss, tremor, neck pain, tension.  Stammering speech.      Social History   Socioeconomic History   Marital status: Married    Spouse name: Eddie   Number of children: 2   Years of education: 12   Highest education level: 12th grade  Occupational History   Occupation: retired Environmental consultant: [employer]  Tobacco Use   Smoking status: Never    Passive exposure: Never   Smokeless tobacco: Never  Vaping Use   Vaping Use: Never used  Substance and Sexual Activity   Alcohol use: No   Drug use: No   Sexual activity: Not Currently  Other Topics Concern   Not on file  Social History Narrative   HSG.  Married '65.     2 daughters, '71, '77; 4 grandchildren   Lives with husband   Right handed   Caffeine: half -1 cup a coffee a day         Social Determinants of Health   Financial Resource Strain: Low Risk    Difficulty of Paying Living Expenses: Not hard at all  Food Insecurity: No Food Insecurity   Worried About Charity fundraiser in the Last Year: Never true   [place] in the Last Year: Never true  Transportation Needs: No Transportation Needs   Lack of Transportation (Medical): No   Lack of Transportation (Non-Medical): No  Physical Activity:  Inactive   Days of Exercise per Week: 0 days   Minutes of Exercise per Session: 0 min  Stress: Stress Concern Present   Feeling of Stress : Rather much  Social Connections: Moderately Integrated   Frequency of Communication with Friends and Family: More than three times a week   Frequency of Social Gatherings with Friends and Family: Three times a week   Attends Religious Services: 1 to 4 times per year   Active Member of Clubs or Organizations: No   Attends Archivist Meetings: Never   Marital Status: Married    Family History  Problem Relation  Age of Onset   Heart failure Mother    Coronary artery disease Mother    Dementia Mother    Diabetes Mother    Colon cancer Neg Hx    Breast cancer Neg Hx    Hypertension Neg Hx    Heart disease Neg Hx    Heart attack Neg Hx    Stroke Neg Hx    Rectal cancer Neg Hx    Stomach cancer Neg Hx    Esophageal cancer Neg Hx    Liver disease Neg Hx     Past Medical History:  Diagnosis Date   Anxiety state, unspecified    Hyperlipidemia    Hypertension    Insomnia    LBBB (left bundle branch block)    LHC in 2002 showed normal coronaries.    Osteoarthrosis, unspecified whether generalized or localized, unspecified site    Overweight(278.02)    Parkinson disease ([place])    S/P placement of cardiac pacemaker    a. 02/21/16: Medtronic Advisa DR MRI SureScan model [id number] (serial number [id number])    Serrated adenoma of colon 2007   Symptomatic menopausal or female climacteric states    Syncope and collapse    11/12: Holter 12/12 with rare PACS, HR range 64-140, average 82, no significant arrhythmias. Echo (1/13): EF 50-55%, mild LVH, septal-lateral dyssynchrony c/w LBBB. 3-week event monitor (1/13): No significant arrhythmia.     Past Surgical History:  Procedure Laterality Date   COLONOSCOPY  2007   EP IMPLANTABLE DEVICE N/A 02/21/2016   Procedure: Pacemaker Implant;  Surgeon: Will Meredith Leeds, MD;  Location: [facility];  Service: Cardiovascular;  Laterality: N/A;   POLYPECTOMY       Current Outpatient Medications on File Prior to Visit  Medication Sig Dispense Refill   acetaminophen (TYLENOL) 500 MG tablet Take 500 mg by mouth every 6 (six) hours as needed.     aspirin EC 81 MG tablet Take 81 mg by mouth daily. Swallow whole.     budesonide-formoterol (SYMBICORT) 160-4.5 MCG/ACT inhaler Inhale 2 puffs into the lungs 2 (two) times daily. 1 each 2   carbidopa-levodopa (SINEMET IR) 25-100 MG tablet Take 1 tablet by mouth 4 (four) times daily. 360 tablet 3   Cholecalciferol (VITAMIN D PO) Take 1 tablet by mouth 2 (two) times a week. Vitamin D     escitalopram (LEXAPRO) 10 MG tablet Take 1 tablet (10 mg total) by mouth daily. 30 tablet 6   ezetimibe (ZETIA) 10 MG tablet Take 1 tablet (10 mg total) by mouth daily. 90 tablet 1   furosemide (LASIX) 20 MG tablet Take 1 tablet (20 mg total) by mouth daily. Alternating 40 mg every other day 90 tablet 3   losartan (COZAAR) 100 MG tablet Take 1 tablet (100 mg total) by mouth daily. 30 tablet 3   nitroGLYCERIN (NITROSTAT) 0.4 MG SL tablet Place 1 tablet (0.4 mg total) under the tongue every 5 (five) minutes x 3 doses as needed for chest pain. 25 tablet 1   No current facility-administered medications on file prior to visit.    No Known Allergies  Physical exam:  Today's Vitals   05/05/21 1244  BP: (!) 130/58  Pulse: (!) 51  Weight: 110 lb (49.9 kg)  Height: 5\' 4"  (1.626 m)   Body mass index is 18.88 kg/m.   Wt Readings from Last 3 Encounters:  05/05/21 110 lb (49.9 kg)  04/14/21 109 lb 3.2 oz (49.5 kg)  03/26/21 107 lb (  48.5 kg)     Ht Readings from Last 3 Encounters:  05/05/21 5\' 4"  (1.626 m)  04/14/21 5\' 4"  (1.626 m)  03/26/21 5\' 5"  (0.932 m)      General: The patient is awake, alert and appears not in acute distress. The patient is well groomed. Head: Normocephalic, atraumatic.  Neck is not supple. Kyphosis. Mallampati 3, oral facial  dyskinesia- automatism? Rooting.     Cardiovascular:  Regular rate and cardiac rhythm by pulse,  without distended neck veins. Respiratory: Lungs are clear to auscultation.  Skin:  Without evidence of ankle edema, or rash. Trunk: The patient's posture is stooped.   Neurologic exam : The patient is awake and alert, oriented to place and time.   Memory subjective described as intact.  Attention span & concentration ability appears normal.  Speech is non- fluent,  with dysarthria, stammering.  Since age 36 " I know the words, but they don't come out"  Mood and affect are appropriate.   Cranial nerves: no loss of smell or taste reported  Pupils are equal and briskly reactive to light. Funduscopic exam deferred. .  Extraocular movements in vertical and horizontal planes were limited, the patient turns her head with every eye movement- No Diplopia. Visual fields by finger perimetry are intact. Hearing was intact to soft voice and finger rubbing.    Facial sensation intact to fine touch.  Facial motor strength is symmetric and tongue and uvula move midline.  Neck ROM : limited and shoulder shrug was symmetrical, but ROM is limited by tension, pain.     Motor exam:  Symmetric bulk, tone and ROM.   Jerking, but normal  tone without cog- wheeling, symmetricaly reduced  grip strength .  noted left hand middle finger twitching and left thumb .  Sensory:  Fine touchand vibration were tested  and  normal.  Proprioception tested in the upper extremities was normal.   Coordination: Rapid alternating movements in the fingers/hands were of normal speed.  The Finger-to-nose maneuver was intact but hesitant - without evidence of ataxia, dysmetria and there was minimal tremor. While she preformed this maneuver , her mouth was moving, lips pursed and relaxed again, over and over.   Gait and station: Patient could rise unassisted from a seated position, walked without assistive device.   Toe and heel walk  were deferred. The patient is not able to walk long distance or climb stairs. She reports a high fall risk.  Deep tendon reflexes: in the upper and lower extremities are symmetric, brisk  and intact. No clonus.  Babinski response was deferred .   Referral by NS for visit with MD- to re evaluate for movement disorder. Suspected is a myelopathy.  She fell last in October 2022, reportedly at a curb.  Previously followed for syncope and then for tremor.     After spending a total time of  45  minutes face to face and additional time for physical and neurologic examination, review of laboratory studies,  personal review of imaging studies, reports and results of other testing and review of referral information / records as far as provided in visit, I have established the following assessments:  1)  there are automatisms of the facial muscles, oral muscles-  2)  no jerking movements but fine tremor,  visible hand muscle fasciculations, yet low strength= UMN and LMN disorder?  How much of this can be related to cord injuries?  3) feeling short of breath but was assured by Pulmonologist  Drexel that she is fine.  4)  limited response to sinemet in the past,    My Plan is to proceed with:   She has taken 25/ 100 mg sinemet at 4 times a day and not felt much relieve- rather more uncontrollable movement. When she took three a day she felt slower, but had no facial movements.  Not only PD tremor can respond to sinemet, also essential tremor or dysnesias can respond.  1)I would like to reduce the mediation to 10-200 mg qid.   2) DAT scan , I can't find any records of MRIs, none in EPIC- will order botih.  Dr Saintclair Halsted has seen this patient since 2016  after a traumatic hemorrhage in  the right frontal  lobe and there are multiple CT and CT Angios of the brain in EPIC. Left sided symptoms of weakness, tremor and gait instability ever since. I see no contraindication for an MRI.   3 ) Her [facility] Pulmonologist  Dr Silas Flood has ordered PT for strength improvement and gait speed.  Shall continue.  4) I will order NCV and EMG for left upper and lower extremities to see if this is a peripheral muscle/ nerve disorder or CNS.   I would like to thank Colon Branch, MD And Kary Kos, [place] [address] [place],  [place] [address] for allowing me to meet with and to take care of this pleasant patient.   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   I plan to follow up either personally or through our NP within 3-5 months.   CC: I will share my notes with PCP, dr Larose Kells. .  Electronically signed by: Larey Seat, MD 05/05/2021 1:18 PM  [facility] [employer] Board certified by The [employer] of Sleep Medicine and Diplomate of the [employer] of Sleep Medicine. Board certified In Neurology through the [place], Fellow of the [employer] of Neurology. Medical Director of [employer].

## 2021-05-05 NOTE — Patient Instructions (Signed)
Traumatic Brain Injury Traumatic brain injury (TBI) is an injury to the brain that results from: A hard, direct hit to the head (closed injury). An object penetrating the skull and entering the brain (open injury). Traumatic brain injury is also called a head injury or a concussion. TBI can be mild, moderate, or severe. What are the causes? Common causes of this condition include: Falls. Motor vehicle accidents. Sports injuries. Assaults. What increases the risk? You are more likely to develop this condition if you: Are 64 years old or older. Are a man. Play contact sports, especially football, hockey, or soccer. Are in the [employer]. Are a victim of violence. Abuse drugs or alcohol. Have had a previous TBI. What are the signs or symptoms? Symptoms may vary from person to person, and may include: Loss of consciousness. Headache. Confusion. Fatigue. Changes in sleep. Dizziness. Mood or personality changes. Memory problems. Nausea or vomiting or both. Seizures. Clumsiness. Slurred speech. Depression and anxiety. Anger. Trouble concentrating, organizing, or making decisions. Inability to control emotions or actions (impulse control). Loss of or dulling of the senses, such as hearing, vision, and touch. This can include: Blurred vision. Ringing in your ears. How is this diagnosed? This condition may be diagnosed based on: Medical history and physical exam. Neurologic exam. This checks for brain and nervous system function, including your reflexes, memory, and coordination. CT scan. Your TBI may be described as mild, moderate, or severe. How is this treated? Treatment depends on the severity of your brain injury and may include: Breathing support (mechanical ventilation). Blood pressure medicines. Pain medicines. Treatments to decrease the swelling in your brain. Brain surgery. This may be needed to: Remove a blood clot. Repair bleeding. Remove an object that has  penetrated the brain, such as a skull fragment or a bullet. Treatment of TBI also includes: Physical and mental rest. Careful observation. Medicine. You may be prescribed medicines to help with symptoms such as headaches, nausea, or difficulty sleeping. Physical, occupational, and speech therapy. Referral to a concussion clinic or rehabilitation center. Follow these instructions at home: Medicines Take over-the-counter and prescription medicines only as told by your health care provider. Do not take blood thinners (anticoagulants), aspirin, or other anti-inflammatory medicines such as ibuprofen or naproxen unless approved by your health care provider. Activity Rest. Rest helps the brain to heal. Make sure you: Get plenty of sleep. Most adults should get 7-9 hours of sleep each night. Rest during the day. Take daytime naps or rest breaks when you feel tired. Do not do high-risk activities that could cause a second concussion, such as riding a bike or playing sports. Having another concussion before the first one has healed can be dangerous. Avoid a lot of visual stimulation. This includes work on the computer or phone, watching TV, and reading. Ask your health care provider what kind of activities are safe for you. Your ability to react may be slower after a brain injury. Never do these activities if you are dizzy. Your health care provider will likely give you a plan for gradually returning to activities. General instructions Do not drink alcohol. Watch your symptoms and tell others to do the same. Complications sometimes occur after a brain injury. Older adults with a brain injury may have a higher risk of serious complications. Seek support from friends and family. Keep all follow-up visits as directed by your health care provider. This is important. Contact a health care provider if: Your symptoms get worse or they do not improve. You  have new symptoms. You have another injury. Get help  right away if: You have: Severe, persistent headaches that are not relieved by medicine. Weakness or numbness in any part of your body. Confusion. Slurred speech. Difficulty waking up. Nausea or persistent vomiting. A feeling like you are moving when you are not (vertigo). Seizures or you faint. Changes in your vision. Clear or bloody discharge from your nose or ears. You cannot use your arms or legs normally. Summary Traumatic brain injury happens when there is a hard, direct hit to the head or when an object penetrates the skull and enters the brain. Traumatic brain injuries may be mild, moderate, or severe. Treatment depends on the severity of your injury. Rest is one of the best treatments. Do not return to activity until your health care provider approves. Get help right away if you have a head injury and you develop seizures, confusion, vomiting, weakness in the arms or legs, slurred speech, and other symptoms. This information is not intended to replace advice given to you by your health care provider. Make sure you discuss any questions you have with your health care provider. Document Revised: 05/20/2020 Document Reviewed: 05/20/2020 Elsevier Patient Education  [place].

## 2021-05-06 ENCOUNTER — Ambulatory Visit: Payer: PPO | Admitting: *Deleted

## 2021-05-06 ENCOUNTER — Telehealth: Payer: Self-pay | Admitting: Neurology

## 2021-05-06 ENCOUNTER — Ambulatory Visit: Payer: PPO | Admitting: Podiatry

## 2021-05-06 DIAGNOSIS — M19012 Primary osteoarthritis, left shoulder: Secondary | ICD-10-CM

## 2021-05-06 DIAGNOSIS — E785 Hyperlipidemia, unspecified: Secondary | ICD-10-CM

## 2021-05-06 DIAGNOSIS — G214 Vascular parkinsonism: Secondary | ICD-10-CM

## 2021-05-06 DIAGNOSIS — F419 Anxiety disorder, unspecified: Secondary | ICD-10-CM

## 2021-05-06 DIAGNOSIS — R7303 Prediabetes: Secondary | ICD-10-CM

## 2021-05-06 DIAGNOSIS — M40204 Unspecified kyphosis, thoracic region: Secondary | ICD-10-CM | POA: Diagnosis not present

## 2021-05-06 DIAGNOSIS — I10 Essential (primary) hypertension: Secondary | ICD-10-CM

## 2021-05-06 DIAGNOSIS — I447 Left bundle-branch block, unspecified: Secondary | ICD-10-CM

## 2021-05-06 DIAGNOSIS — R5382 Chronic fatigue, unspecified: Secondary | ICD-10-CM

## 2021-05-06 NOTE — Patient Instructions (Addendum)
Visit Information  Thank you for taking time to visit with me today. Please don't hesitate to contact me if I can be of assistance to you before our next scheduled telephone appointment.  Following are the goals we discussed today:   Patient Goals/Self-Care Activities:  Patient will work with LCSW on a bi-weekly basis, until approved for [employer], or until services are in place through a private agency of choice.   Patient agreed to periodically follow-up with Rosealee Albee, Social Worker II with the [facility] [facility] [phone]), to check the status of her name on the waiting list to receive In-Home Aide Services.   Patient will begin contacting agencies of interest, from the list provided:     ~ [place] ~ [place] ~ [facility] ~ [place] Patient will consider self-enrollment in activities of interest offered through [employer] [facility] [place] and [place]. Patient encouraged to contact LCSW (# [phone]) directly, if she has questions, needs assistance, or if additional social work needs are identified between now and our next scheduled telephone outreach call.  Follow-Up Plan:  LCSW will follow-up with patient by telephone on 05/20/2021 at 1:30 pm.  Please call the care guide team at [phone] if you need to cancel or reschedule your appointment.   If you are experiencing a Mental Health or [place] or need someone to talk to, please call the Suicide and Crisis Lifeline: 988 call the [place] National Suicide Prevention Lifeline: 214-713-0606 or TTY: 727-091-8235 TTY 3607416204) to talk to a trained counselor call 1-800-273-TALK (toll free, 24 hour hotline) go to [facility] [address], [place] [phone]) call the [place]: [phone] call 911   Patient verbalizes understanding of instructions and care plan provided today and agrees to view in [place]. Active MyChart status confirmed with patient.    [place] Licensed Clinical Social Worker [facility] [phone]

## 2021-05-06 NOTE — Chronic Care Management (AMB) (Signed)
Chronic Care Management    Clinical Social Work Note  05/06/2021 Name: Mandy Durham MRN: [id number] DOB: [birth year]  Mandy Durham is a 76 y.o. year old female who is a primary care patient of Larose Kells, Alda Berthold, MD. The CCM team was consulted to assist the patient with chronic disease management and/or care coordination needs related to: [employer] , Level of Care Concerns, and Caregiver Stress.   Engaged with patient by telephone for follow up visit in response to provider referral for social work chronic care management and care coordination services.   Consent to Services:  The patient was given information about Chronic Care Management services, agreed to services, and gave verbal consent prior to initiation of services.  Please see initial visit note for detailed documentation.   Patient agreed to services and consent obtained.   Assessment: Review of patient past medical history, allergies, medications, and health status, including review of relevant consultants reports was performed today as part of a comprehensive evaluation and provision of chronic care management and care coordination services.     SDOH (Social Determinants of Health) assessments and interventions performed:    Advanced Directives Status: Not addressed in this encounter.  CCM Care Plan  No Known Allergies  Outpatient Encounter Medications as of 05/06/2021  Medication Sig   acetaminophen (TYLENOL) 500 MG tablet Take 500 mg by mouth every 6 (six) hours as needed.   aspirin EC 81 MG tablet Take 81 mg by mouth daily. Swallow whole.   budesonide-formoterol (SYMBICORT) 160-4.5 MCG/ACT inhaler Inhale 2 puffs into the lungs 2 (two) times daily.   carbidopa-levodopa (SINEMET) 10-100 MG tablet Take 1 tablet by mouth 4 (four) times daily.   Cholecalciferol (VITAMIN D PO) Take 1 tablet by mouth 2 (two) times a week. Vitamin D   escitalopram (LEXAPRO) 10 MG tablet Take 1 tablet (10 mg total) by mouth daily.    ezetimibe (ZETIA) 10 MG tablet Take 1 tablet (10 mg total) by mouth daily.   furosemide (LASIX) 20 MG tablet Take 1 tablet (20 mg total) by mouth daily. Alternating 40 mg every other day   losartan (COZAAR) 100 MG tablet Take 1 tablet (100 mg total) by mouth daily.   nitroGLYCERIN (NITROSTAT) 0.4 MG SL tablet Place 1 tablet (0.4 mg total) under the tongue every 5 (five) minutes x 3 doses as needed for chest pain.   No facility-administered encounter medications on file as of 05/06/2021.    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[place]) 09/07/2018   Functional dyspepsia 09/08/2017   Slow transit constipation 09/08/2017   S/P placement of cardiac pacemaker    Mobitz II 02/20/2016   Nystagmus, end-position 02/12/2016   Dizziness and giddiness 02/12/2016   Flapping tremor 02/12/2016   Chronic fatigue 10/28/2015   PCP NOTES >>>> 02/13/2015   TBI (traumatic brain injury) 01/28/2015   Essential hypertension    Acute post-traumatic headache, not intractable    Skull fracture (HCC)    Vertigo due to brain injury    SAH (subarachnoid hemorrhage) ([place]) 01/21/2015   Annual physical exam 09/07/2014   Orthostatic hypotension 08/30/2014   Acromioclavicular arthrosis 03/21/2014   Allergic rhinitis 02/27/2014   SI (sacroiliac) joint dysfunction 02/02/2014  Gastrocnemius strain, left 12/11/2013   Back pain 08/22/2013   LBBB (left bundle branch block) 05/13/2013   Shingles outbreak 12/15/2012   Borderline diabetes    Insomnia 01/12/2008   HLD (hyperlipidemia) 02/04/2007   Anxiety 10/12/2006   Osteoarthritis 10/12/2006    Conditions to be addressed/monitored:  Hypertension, Vascular  Parkinsonism, Traumatic Brain Injury, Subarachnoid Hemorrhage.  Limited Social Support, Level of Care Concerns, ADL/IADL Limitations, Social Isolation, Limited Access to Caregiver, Cognitive Deficits, Memory Deficits, and Lacks Knowledge of [employer].  Care Plan : LCSW Plan of Care  Updates made by Francis Gaines, LCSW since 05/06/2021 12:00 AM     Problem: Find Help in My Community.   Priority: High     Goal: Find Help in My Community.   Start Date: 04/30/2021  Expected End Date: 07/28/2021  This Visit's Progress: On track  Recent Progress: On track  Priority: High  Note:   Current Barriers:   Patient with Hypertension, Vascular Parkinsonism, Traumatic Brain Injury, Subarachnoid Hemorrhage, Skull Fracture, Dizziness, Giddiness, Anxiety, and Chronic Fatigue, needs Support, Education, Resources, Referrals, Advocacy, and Care Coordination, to resolve unmet personal care needs in the home. Patient is unable to self-administer medications as prescribed, or consistently perform ADL's/IADL's independently. Lacks knowledge of available community agencies and resources. Clinical Goals:  Patient will have [place] in place, either through [facility], Education officer, museum II with the [place] [facility], or through a private agency of choice. Patient will work with LCSW and Rosealee Albee, Education officer, museum II with the [place] [facility], to coordinate care for [employer]. Patient will demonstrate improved health management independence as evidenced by having [place] in place. Interventions: Collaboration with Primary Care Provider, Dr. Kathlene November regarding development and update of comprehensive plan of care as evidenced by provider attestation and co-signature. Inter-disciplinary care team collaboration (see longitudinal plan of care). Clinical  Interventions: Caregiver Stress Acknowledged and Consideration of In-Home Aide Services Encouraged. Emotional Support Provided and Verbalization of Feelings Emphasized. LCSW collaboration with Rosealee Albee, Social Worker II with the [facility], to verify receipt of completed and signed application, as well as to confirm position on waiting list.   Revisited and provided additional education to patient regarding level of care options (I.e Senior Living, Assisted Living, Memory Care).  Patient Goals/Self-Care Activities:  Patient will work with LCSW on a bi-weekly basis, until approved for [employer], or until services are in place through a private agency of choice.   Patient agreed to periodically follow-up with Rosealee Albee, Social Worker II with the [place] [facility] [phone]), to check the status of her name on the waiting list to receive In-Home Aide Services.   Patient will begin contacting agencies of interest, from the list provided:     ~ [place] ~ [place] ~ [place] Providers ~ [place] Patient will consider self-enrollment in activities of interest offered through [employer] [facility] [place] and [place]. Patient encouraged to contact LCSW (# [phone]) directly, if she has questions, needs assistance, or if additional social work needs are identified between now and our next scheduled telephone outreach call.  Follow-Up Plan:  LCSW will follow-up with patient by telephone on 05/20/2021 at 1:30 pm.  [place] Licensed Clinical Social Worker [facility] [employer] [phone]

## 2021-05-06 NOTE — Telephone Encounter (Signed)
Health team no auth req ref # [id number] to Nuclear medicine they will reach out to the patient to schedule.

## 2021-05-07 ENCOUNTER — Ambulatory Visit: Payer: PPO | Admitting: Physical Therapy

## 2021-05-07 ENCOUNTER — Other Ambulatory Visit: Payer: Self-pay

## 2021-05-07 DIAGNOSIS — M542 Cervicalgia: Secondary | ICD-10-CM

## 2021-05-07 DIAGNOSIS — R293 Abnormal posture: Secondary | ICD-10-CM

## 2021-05-07 DIAGNOSIS — R2689 Other abnormalities of gait and mobility: Secondary | ICD-10-CM

## 2021-05-07 DIAGNOSIS — M6281 Muscle weakness (generalized): Secondary | ICD-10-CM

## 2021-05-07 NOTE — Therapy (Signed)
[facility] [address] [place], [place], [address] Phone: [phone]   Fax:  [phone]  Physical Therapy Treatment  Patient Details  Name: Mandy Durham MRN: [id number] Date of Birth: Jul 31, 1945 Referring Provider (PT): [name], [name], MD   Encounter Date: 05/07/2021   PT End of Session - 05/07/21 1319     Visit Number 11    Number of Visits 25    Date for PT Re-Evaluation 05/29/21    Authorization Type [employer]    PT Start Time 1317    PT Stop Time 1358    PT Time Calculation (min) 41 min    Activity Tolerance Patient tolerated treatment well    Behavior During Therapy [facility] for tasks assessed/performed             Past Medical History:  Diagnosis Date   Anxiety state, unspecified    Hyperlipidemia    Hypertension    Insomnia    LBBB (left bundle branch block)    LHC in 2002 showed normal coronaries.    Osteoarthrosis, unspecified whether generalized or localized, unspecified site    Overweight(278.02)    Parkinson disease ([place])    S/P placement of cardiac pacemaker    a. 02/21/16: Medtronic Advisa DR MRI SureScan model B1QX45 (serial number [id number])    Serrated adenoma of colon 2007   Symptomatic menopausal or female climacteric states    Syncope and collapse    11/12: Holter 12/12 with rare PACS, HR range 64-140, average 82, no significant arrhythmias. Echo (1/13): EF 50-55%, mild LVH, septal-lateral dyssynchrony c/w LBBB. 3-week event monitor (1/13): No significant arrhythmia.     Past Surgical History:  Procedure Laterality Date   COLONOSCOPY  2007   EP IMPLANTABLE DEVICE N/A 02/21/2016   Procedure: Pacemaker Implant;  Surgeon: Will Meredith Leeds, MD;  Location: [facility];  Service: Cardiovascular;  Laterality: N/A;   POLYPECTOMY      There were no vitals filed for this visit.   Subjective Assessment - 05/07/21 1320     Subjective Saw Dr. Saintclair Halsted (the  neurosurgeon) yesterday. Reports that he says that he wants pt to do exercises for her neck and her back in therapy (do not have a referral for pt's back). Pt does not remember what was discussed during this visit. Saw Dr. Brett Fairy on Monday. per neurologist's notes, pt does not have Parkinson's Disease. They have ordered a DAT scan and also want to do UMN/LMN testing. Neck has still been hurting.    Pertinent History PMH: Parkinsonism anxiety, HLD, HTN, pacemaker, hx of TBI (2016)    Limitations Walking;House hold activities;Standing    Patient Stated Goals Wants to improve her balance, posture; improve pain    Pain Onset More than a month ago                               [facility] Adult PT Treatment/Exercise - 05/07/21 1342       Self-Care   Self-Care Other Self-Care Comments    Other Self-Care Comments  Pt with recent visit to neurologist and neurosurgeon. Pt reporting that she is not sure what was discussed in neurosurgeon appt, but that Dr. Saintclair Halsted wants her to work on PT for her back. PT stating that there is no referral in the system for this and PT unable to see these notes due to it being an outside provider. PT will  need to reach out to their office in order to figure out what was discussed during appt this week (PT having to repeat this multiple times during session due to pt asking about it). Reviewed what was discussed with pt via neurologists note the other day. Reminded pt and updated pt's print out calendar of appt next week on MONDAY and not tuesday. Discussed POC going forwards - pt wants to continue to work on her neck pain, balance, and strength. Will plan to add an additional 2x week for 4 weeks and then assess progress and determine plan after that. Had long discussion about scheduling as PT's schedules are pretty full for the next couple of weeks and will plan to place pt on waitlist.      Neck Exercises: Seated   Neck Retraction Limitations;10 reps    Neck  Retraction Limitations Seated against thin pillow, cues for chin tuck first and then gently pressing head back into pillow, x5 second hold each time.    Other Seated Exercise Seated scapular retraction, pt needing tactile/verbal/demo cues, pt with difficulty activating initially. Trialed with pt holding yellow tband and pulling back into a row with PT holding band with pt demonstrating improved activation this way after a few reps, performed x7 reps total.                       PT Short Term Goals - 04/03/21 1728       PT SHORT TERM GOAL #1   Title Pt will be independent in initial HEP with husband supervision in order to build upon functional Durham made in therapy. ALL STGS DUE 03/20/21    Baseline pt needs frequent reminders and review of HEP    Time 4    Period Weeks    Status On-going    Target Date 03/30/21      PT SHORT TERM GOAL #2   Title Cervical ROM to be assessed with STG/LTG written as appropriate.    Time 4    Period Weeks    Status Achieved      PT SHORT TERM GOAL #3   Title Pt will decr TUG time to 17 seconds or less in order to demo decr fall risk.    Baseline 22.72 seconds; 13.93 seconds on 04/01/21    Time 4    Period Weeks    Status Achieved      PT SHORT TERM GOAL #4   Title Will perform push and release test with LTG written in order to demo improved balance reactions.    Time 4    Period Weeks    Status Achieved      PT SHORT TERM GOAL #5   Title Pt will improve gait speed to at least 1.5 ft/sec with no AD vs. LRAD in order to demo decr fall risk.    Baseline 1.09 ft/sec with no AD; 2.32 ft/sec on 04/01/21    Time 4    Period Weeks    Status Achieved      PT SHORT TERM GOAL #6   Title improve cervical Rt sidebending to at least 15 deg for improved mobility    Baseline met - 20 deg of lateral flexion to R    Time 3    Period Weeks    Status Achieved    Target Date 03/30/21               PT Long Term Goals - 04/30/21 1205  PT LONG TERM GOAL #1   Title Pt will be independent in final HEP with husband supervision in order to build upon functional Durham made in therapy. ALL LTGS DUE 05/28/21    Baseline pt will continue to benefit from review and have pt's husband come back for education.    Time 12    Period Weeks    Status On-going    Target Date 05/28/21      PT LONG TERM GOAL #2   Title Pt will recover posterior and lateral balance in push and release test in 1 robust step independently, for improved balance recovery    Baseline ON 04/29/21: 2 steps posteriorly, 1 robust anterior step. R lateral = 2 steps, L lateral = 3-4 steps    Time 12    Period Weeks    Status New      PT LONG TERM GOAL #3   Title Pt will improve cervical AROM of R rotation to 35 degrees and L rotation to 45 degrees in order to demo improved functional mobility.    Baseline R: 30 deg, L 40 deg    Time 12    Period Weeks    Status New      PT LONG TERM GOAL #5   Title Pt will ambulate at least 200' over indoor and outdoor unlevel surfaces with supervision with no AD vs. LRAD in order to demo improved community mobility.    Time 12    Period Weeks    Status Revised                   Plan - 05/07/21 1542     Clinical Impression Statement Pt saw the neurologist this past week and per their notes, pt presents with orofacial dyskinesia,  hyperreflexia, weakness and SOB and does not have PD. Pt also saw the neurosurgeon regarding the results of her recent MRIs; PT unable to see results due to this being an outside provider. PT to reach out to their office for more information. Had long discussion regarding POC and plan on what to work on with therapy going forwards (continue strength, balance, and postural exercises). Worked on seated cervical and scapular retraction at end of session for postural strengthening with pt needing multi-modal cues for proper technique. Will continue to progress towards LTGs.    Personal Factors and  Comorbidities Comorbidity 3+;Past/Current Experience;Time since onset of injury/illness/exacerbation    Comorbidities PMH: Parkinsonism, anxiety, HLD, HTN, pacemaker, syncope with collapse resulting in SAH/TBI 2016, aphasia/stuttering    Examination-Activity Limitations Transfers;Stairs;Stand;Locomotion Level    Examination-Participation Restrictions Community Activity;Cleaning;Laundry;Driving    Stability/Clinical Decision Making Evolving/Moderate complexity    Rehab Potential Fair   due to deficits   PT Frequency 2x / week    PT Duration 12 weeks    PT Treatment/Interventions ADLs/Self Care Home Management;Moist Heat;DME Instruction;Gait training;Stair training;Functional mobility training;Therapeutic activities;Neuromuscular re-education;Balance training;Therapeutic exercise;Patient/family education;Manual techniques;Passive range of motion;Dry needling;Vestibular;Cryotherapy;Electrical Stimulation;Traction;Ultrasound;Taping    PT Next Visit Plan Continue with supine and seated isometric neck exercises,and sidelying concentric against gravity. See if pt can work on any other seated postural exercises for HEP. Continue to work on postural/cervical stretching and ROM. Work on functional strengthening, SLS, and stepping strategies. Will have to check if pt can get seen any earlier for therapy as she will have a gap in scheduling.    PT Home Exercise Plan Access Code: [email or web address]    Consulted and Agree with Plan of Care Patient;Family member/caregiver  Patient will benefit from skilled therapeutic intervention in order to improve the following deficits and impairments:  Abnormal gait, Decreased activity tolerance, Decreased coordination, Decreased balance, Decreased endurance, Decreased range of motion, Decreased strength, Difficulty walking, Impaired flexibility, Increased muscle spasms, Impaired sensation, Postural dysfunction, Pain, Decreased mobility, Decreased knowledge of use of DME,  Impaired tone, Increased fascial restricitons, Impaired UE functional use  Visit Diagnosis: Abnormal posture  Cervicalgia  Muscle weakness (generalized)  Other abnormalities of gait and mobility     Problem List Patient Active Problem List   Diagnosis Date Noted   Kyphosis due to degeneration of spine 05/05/2021   Drug-induced orofacial dyskinesia 05/05/2021   Hyperkinesis 05/05/2021   Traumatic intracerebral hemorrhage with loss of consciousness of 31 minutes to 59 minutes ([place]) 05/05/2021   Stammering and stuttering 05/05/2021   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[place]) 09/07/2018   Functional dyspepsia 09/08/2017   Slow transit constipation 09/08/2017   S/P placement of cardiac pacemaker    Mobitz II 02/20/2016   Nystagmus, end-position 02/12/2016   Dizziness and giddiness 02/12/2016   Flapping tremor 02/12/2016   Chronic fatigue 10/28/2015   PCP NOTES >>>> 02/13/2015   TBI (traumatic brain injury) 01/28/2015   Essential hypertension    Acute post-traumatic headache, not intractable    Skull fracture (HCC)    Vertigo due to brain injury    SAH (subarachnoid hemorrhage) ([place]) 01/21/2015   Annual physical exam 09/07/2014   Orthostatic hypotension 08/30/2014   Acromioclavicular arthrosis 03/21/2014   Allergic rhinitis 02/27/2014   SI (sacroiliac) joint dysfunction 02/02/2014   Gastrocnemius strain, left 12/11/2013   Back pain 08/22/2013   LBBB (left bundle branch block) 05/13/2013   Shingles outbreak 12/15/2012   Borderline diabetes    Insomnia 01/12/2008   HLD (hyperlipidemia) 02/04/2007   Anxiety 10/12/2006   Osteoarthritis 10/12/2006    Arliss Journey, PT, DPT  05/07/2021, 3:46 PM  [facility] [address] [place], [place], [address] Phone: [phone]   Fax:  [phone]  Name: Mandy Durham MRN: [id number] Date of Birth: Feb 20, 1946

## 2021-05-12 ENCOUNTER — Encounter: Payer: Self-pay | Admitting: Physical Therapy

## 2021-05-12 ENCOUNTER — Other Ambulatory Visit: Payer: Self-pay

## 2021-05-12 ENCOUNTER — Ambulatory Visit: Payer: PPO | Admitting: Physical Therapy

## 2021-05-12 DIAGNOSIS — M542 Cervicalgia: Secondary | ICD-10-CM

## 2021-05-12 DIAGNOSIS — M6281 Muscle weakness (generalized): Secondary | ICD-10-CM

## 2021-05-12 DIAGNOSIS — R293 Abnormal posture: Secondary | ICD-10-CM | POA: Diagnosis not present

## 2021-05-12 NOTE — Patient Instructions (Addendum)
Access Code: [email or web address] URL: [email or web address] Date: 05/12/2021 Prepared by: Misty Stanley  Exercises Seated Upper Trapezius Stretch - 2 x daily - 7 x weekly - 1 sets - 3 reps - 30 sec hold Supine Diaphragmatic Breathing - 1-2 x daily - 7 x weekly - 1-2 sets - 10 reps Sidelying Neck Sidebending - 1 x daily - 7 x weekly - 2 sets - 10 reps Supine Bridge - 1 x daily - 7 x weekly - 1 sets - 10 reps Scapular Retraction with Resistance - 1 x daily - 7 x weekly - 2 sets - 5 reps Seated Shoulder Horizontal Abduction with Resistance - Palms Down - 1 x daily - 7 x weekly - 2 sets - 5 reps Sit to Stand with Armchair - 1-2 x daily - 5 x weekly - 2 sets - 4 reps Isometric Cervical Extension at Wall with Ball - 1 x daily - 7 x weekly - 2 sets - 5 reps - 10 second hold Alternating Step Taps with Counter Support - 1-2 x daily - 5 x weekly - 2 sets - 5 reps

## 2021-05-12 NOTE — Therapy (Signed)
[facility] [address] [place], [place], [address] Phone: [phone]   Fax:  [phone]  Physical Therapy Treatment  Patient Details  Name: Mandy Durham MRN: [id number] Date of Birth: 1945/11/07 Referring Provider (PT): [name], [name], MD   Encounter Date: 05/12/2021   PT End of Session - 05/12/21 2024     Visit Number 12    Number of Visits 25    Date for PT Re-Evaluation 05/29/21    Authorization Type [employer]    PT Start Time 5537    PT Stop Time 1450    PT Time Calculation (min) 37 min    Activity Tolerance Patient tolerated treatment well    Behavior During Therapy [facility] for tasks assessed/performed             Past Medical History:  Diagnosis Date   Anxiety state, unspecified    Hyperlipidemia    Hypertension    Insomnia    LBBB (left bundle branch block)    LHC in 2002 showed normal coronaries.    Osteoarthrosis, unspecified whether generalized or localized, unspecified site    Overweight(278.02)    Parkinson disease ([place])    S/P placement of cardiac pacemaker    a. 02/21/16: Medtronic Advisa DR MRI SureScan model S8OL07 (serial number [id number])    Serrated adenoma of colon 2007   Symptomatic menopausal or female climacteric states    Syncope and collapse    11/12: Holter 12/12 with rare PACS, HR range 64-140, average 82, no significant arrhythmias. Echo (1/13): EF 50-55%, mild LVH, septal-lateral dyssynchrony c/w LBBB. 3-week event monitor (1/13): No significant arrhythmia.     Past Surgical History:  Procedure Laterality Date   COLONOSCOPY  2007   EP IMPLANTABLE DEVICE N/A 02/21/2016   Procedure: Pacemaker Implant;  Surgeon: Will Meredith Leeds, MD;  Location: [facility];  Service: Cardiovascular;  Laterality: N/A;   POLYPECTOMY      There were no vitals filed for this visit.   Subjective Assessment - 05/12/21 1416     Subjective Pt feels like her neck is  holding up a bowling ball.  One of the procedures is scheduled for late July but isn't sure which one it is.  Dr. Saintclair Halsted would like patient's exercises for her neck to incorporate upper and mid back to assist with stability.    Pertinent History PMH: Parkinsonism anxiety, HLD, HTN, pacemaker, hx of TBI (2016)    Limitations Walking;House hold activities;Standing    Patient Stated Goals Wants to improve her balance, posture; improve pain    Pain Onset More than a month ago            Updated patient's HEP to utilize gravity or resistance theraband to progress exercises focusing on lateral flexion and neck/back extension.  Attempted to perform head/neck extension in prone and quadruped but pt unable to tolerate due to discomfort in abdomen/hip flexor - changed to standing and seated for extension exercises.  Lateral flexion performed in sidelying.  New exercises reviewed today in bold.  Access Code: [email or web address] URL: [email or web address] Date: 05/12/2021 Prepared by: Misty Stanley  Exercises Seated Upper Trapezius Stretch - 2 x daily - 7 x weekly - 1 sets - 3 reps - 30 sec hold Supine Diaphragmatic Breathing - 1-2 x daily - 7 x weekly - 1-2 sets - 10 reps Sidelying Neck Sidebending - 1 x daily - 7 x weekly - 2 sets - 10 reps - perform on  R and L side Supine Bridge - 1 x daily - 7 x weekly - 1 sets - 10 reps Scapular Retraction with Resistance - 1 x daily - 7 x weekly - 2 sets - 5 reps - using red theraband, seated with towel roll along spine Seated Shoulder Horizontal Abduction with Resistance - Palms Down - 1 x daily - 7 x weekly - 2 sets - 5 reps - using red theraband, seated with towel roll along spine Sit to Stand with Armchair - 1-2 x daily - 5 x weekly - 2 sets - 4 reps Isometric Cervical Extension at [employer] with Ball - 1 x daily - 7 x weekly - 2 sets - 5 reps - 10 second hold -use of towel roll behind head, performed in standing Alternating Step Taps with Counter Support - 1-2 x  daily - 5 x weekly - 2 sets - 5 reps    PT Education - 05/12/21 2023     Education Details updated HEP and progressed lateral flexion and extension strengthening    Person(s) Educated Patient    Methods Explanation;Demonstration;Handout    Comprehension Verbalized understanding;Returned demonstration              PT Short Term Goals - 04/03/21 1728       PT SHORT TERM GOAL #1   Title Pt will be independent in initial HEP with husband supervision in order to build upon functional gains made in therapy. ALL STGS DUE 03/20/21    Baseline pt needs frequent reminders and review of HEP    Time 4    Period Weeks    Status On-going    Target Date 03/30/21      PT SHORT TERM GOAL #2   Title Cervical ROM to be assessed with STG/LTG written as appropriate.    Time 4    Period Weeks    Status Achieved      PT SHORT TERM GOAL #3   Title Pt will decr TUG time to 17 seconds or less in order to demo decr fall risk.    Baseline 22.72 seconds; 13.93 seconds on 04/01/21    Time 4    Period Weeks    Status Achieved      PT SHORT TERM GOAL #4   Title Will perform push and release test with LTG written in order to demo improved balance reactions.    Time 4    Period Weeks    Status Achieved      PT SHORT TERM GOAL #5   Title Pt will improve gait speed to at least 1.5 ft/sec with no AD vs. LRAD in order to demo decr fall risk.    Baseline 1.09 ft/sec with no AD; 2.32 ft/sec on 04/01/21    Time 4    Period Weeks    Status Achieved      PT SHORT TERM GOAL #6   Title improve cervical Rt sidebending to at least 15 deg for improved mobility    Baseline met - 20 deg of lateral flexion to R    Time 3    Period Weeks    Status Achieved    Target Date 03/30/21               PT Long Term Goals - 04/30/21 1205       PT LONG TERM GOAL #1   Title Pt will be independent in final HEP with husband supervision in order to build upon functional gains made in  therapy. ALL LTGS DUE 05/28/21     Baseline pt will continue to benefit from review and have pt's husband come back for education.    Time 12    Period Weeks    Status On-going    Target Date 05/28/21      PT LONG TERM GOAL #2   Title Pt will recover posterior and lateral balance in push and release test in 1 robust step independently, for improved balance recovery    Baseline ON 04/29/21: 2 steps posteriorly, 1 robust anterior step. R lateral = 2 steps, L lateral = 3-4 steps    Time 12    Period Weeks    Status New      PT LONG TERM GOAL #3   Title Pt will improve cervical AROM of R rotation to 35 degrees and L rotation to 45 degrees in order to demo improved functional mobility.    Baseline R: 30 deg, L 40 deg    Time 12    Period Weeks    Status New      PT LONG TERM GOAL #5   Title Pt will ambulate at least 200' over indoor and outdoor unlevel surfaces with supervision with no AD vs. LRAD in order to demo improved community mobility.    Time 12    Period Weeks    Status Revised                   Plan - 05/12/21 2025     Clinical Impression Statement Treatment session focused on updating and progressing neck strengthening exercises and adding in exercises to strengthen mid and upper back.  Pt was not able to tolerate or perform extension exercises in prone with use of gravity as resistance.  Will continue to address and progress towards LTG.    Personal Factors and Comorbidities Comorbidity 3+;Past/Current Experience;Time since onset of injury/illness/exacerbation    Comorbidities PMH: Parkinsonism, anxiety, HLD, HTN, pacemaker, syncope with collapse resulting in SAH/TBI 2016, aphasia/stuttering    Examination-Activity Limitations Transfers;Stairs;Stand;Locomotion Level    Examination-Participation Restrictions Community Activity;Cleaning;Laundry;Driving    Stability/Clinical Decision Making Evolving/Moderate complexity    Rehab Potential Fair   due to deficits   PT Frequency 2x / week    PT  Duration 12 weeks    PT Treatment/Interventions ADLs/Self Care Home Management;Moist Heat;DME Instruction;Gait training;Stair training;Functional mobility training;Therapeutic activities;Neuromuscular re-education;Balance training;Therapeutic exercise;Patient/family education;Manual techniques;Passive range of motion;Dry needling;Vestibular;Cryotherapy;Electrical Stimulation;Traction;Ultrasound;Taping    PT Next Visit Plan how are new exercises going?  Continue to progress postural exercises, upper back/neck extension, would any of the PWR exercises be good for her?    PT Home Exercise Plan Access Code: [email or web address]    Consulted and Agree with Plan of Care Patient             Patient will benefit from skilled therapeutic intervention in order to improve the following deficits and impairments:  Abnormal gait, Decreased activity tolerance, Decreased coordination, Decreased balance, Decreased endurance, Decreased range of motion, Decreased strength, Difficulty walking, Impaired flexibility, Increased muscle spasms, Impaired sensation, Postural dysfunction, Pain, Decreased mobility, Decreased knowledge of use of DME, Impaired tone, Increased fascial restricitons, Impaired UE functional use  Visit Diagnosis: Abnormal posture  Cervicalgia  Muscle weakness (generalized)     Problem List Patient Active Problem List   Diagnosis Date Noted   Kyphosis due to degeneration of spine 05/05/2021   Drug-induced orofacial dyskinesia 05/05/2021   Hyperkinesis 05/05/2021   Traumatic intracerebral hemorrhage with loss of consciousness of 31  minutes to 59 minutes ([place]) 05/05/2021   Stammering and stuttering 05/05/2021   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[place]) 09/07/2018   Functional dyspepsia 09/08/2017   Slow transit constipation 09/08/2017   S/P placement of cardiac pacemaker    Mobitz II 02/20/2016   Nystagmus, end-position 02/12/2016   Dizziness and giddiness 02/12/2016   Flapping tremor 02/12/2016   Chronic  fatigue 10/28/2015   PCP NOTES >>>> 02/13/2015   TBI (traumatic brain injury) 01/28/2015   Essential hypertension    Acute post-traumatic headache, not intractable    Skull fracture ([facility])    Vertigo due to brain injury    SAH (subarachnoid hemorrhage) ([place]) 01/21/2015   Annual physical exam 09/07/2014   Orthostatic hypotension 08/30/2014   Acromioclavicular arthrosis 03/21/2014   Allergic rhinitis 02/27/2014   SI (sacroiliac) joint dysfunction 02/02/2014   Gastrocnemius strain, left 12/11/2013   Back pain 08/22/2013   LBBB (left bundle branch block) 05/13/2013   Shingles outbreak 12/15/2012   Borderline diabetes    Insomnia 01/12/2008   HLD (hyperlipidemia) 02/04/2007   Anxiety 10/12/2006   Osteoarthritis 10/12/2006    Rico Junker, PT, DPT 05/12/21    8:29 PM    [facility] [address] [place], [place], [address] Phone: [phone]   Fax:  [phone]  Name: Mandy Durham MRN: [id number] Date of Birth: Feb 04, 1946

## 2021-05-13 ENCOUNTER — Ambulatory Visit: Payer: PPO | Admitting: Physical Therapy

## 2021-05-13 DIAGNOSIS — E785 Hyperlipidemia, unspecified: Secondary | ICD-10-CM | POA: Diagnosis not present

## 2021-05-13 DIAGNOSIS — I10 Essential (primary) hypertension: Secondary | ICD-10-CM | POA: Diagnosis not present

## 2021-05-13 DIAGNOSIS — I609 Nontraumatic subarachnoid hemorrhage, unspecified: Secondary | ICD-10-CM

## 2021-05-13 DIAGNOSIS — M19012 Primary osteoarthritis, left shoulder: Secondary | ICD-10-CM

## 2021-05-13 NOTE — Telephone Encounter (Signed)
David with Nuclear medicine messaged me and informed me yesterday.  : have just left her a VM this morning at 10am again to call us to schedule. I will try her back this afternoon:

## 2021-05-14 ENCOUNTER — Telehealth: Payer: PPO

## 2021-05-19 ENCOUNTER — Ambulatory Visit: Payer: PPO | Attending: Adult Health | Admitting: Physical Therapy

## 2021-05-19 ENCOUNTER — Other Ambulatory Visit: Payer: Self-pay

## 2021-05-19 ENCOUNTER — Encounter: Payer: Self-pay | Admitting: Physical Therapy

## 2021-05-19 DIAGNOSIS — R293 Abnormal posture: Secondary | ICD-10-CM | POA: Insufficient documentation

## 2021-05-19 DIAGNOSIS — R2681 Unsteadiness on feet: Secondary | ICD-10-CM | POA: Insufficient documentation

## 2021-05-19 DIAGNOSIS — M6281 Muscle weakness (generalized): Secondary | ICD-10-CM | POA: Insufficient documentation

## 2021-05-19 DIAGNOSIS — M542 Cervicalgia: Secondary | ICD-10-CM | POA: Insufficient documentation

## 2021-05-19 DIAGNOSIS — R2689 Other abnormalities of gait and mobility: Secondary | ICD-10-CM | POA: Insufficient documentation

## 2021-05-19 NOTE — Therapy (Addendum)
OUTPATIENT PHYSICAL THERAPY TREATMENT NOTE   Patient Name: Mandy Durham MRN: [id number] DOB:11/01/1945, 76 y.o., female Today's Date: 05/19/2021  PCP: Colon Branch, MD REFERRING PROVIDER: Colon Branch, MD   PT End of Session - 05/19/21 1321     Visit Number 13    Number of Visits 25    Date for PT Re-Evaluation 05/29/21    Authorization Type [employer]    PT Start Time 1319    PT Stop Time 1401    PT Time Calculation (min) 42 min    Activity Tolerance Patient tolerated treatment well    Behavior During Therapy [facility] for tasks assessed/performed             Past Medical History:  Diagnosis Date   Anxiety state, unspecified    Hyperlipidemia    Hypertension    Insomnia    LBBB (left bundle branch block)    LHC in 2002 showed normal coronaries.    Osteoarthrosis, unspecified whether generalized or localized, unspecified site    Overweight(278.02)    Parkinson disease ([place])    S/P placement of cardiac pacemaker    a. 02/21/16: Medtronic Advisa DR MRI SureScan model [id number] (serial number [id number])    Serrated adenoma of colon 2007   Symptomatic menopausal or female climacteric states    Syncope and collapse    11/12: Holter 12/12 with rare PACS, HR range 64-140, average 82, no significant arrhythmias. Echo (1/13): EF 50-55%, mild LVH, septal-lateral dyssynchrony c/w LBBB. 3-week event monitor (1/13): No significant arrhythmia.    Past Surgical History:  Procedure Laterality Date   COLONOSCOPY  2007   EP IMPLANTABLE DEVICE N/A 02/21/2016   Procedure: Pacemaker Implant;  Surgeon: Will Meredith Leeds, MD;  Location: [facility];  Service: Cardiovascular;  Laterality: N/A;   POLYPECTOMY     Patient Active Problem List   Diagnosis Date Noted   Kyphosis due to degeneration of spine 05/05/2021   Drug-induced orofacial dyskinesia 05/05/2021   Hyperkinesis 05/05/2021   Traumatic intracerebral hemorrhage with loss of consciousness of 31 minutes to 59 minutes  ([place]) 05/05/2021   Stammering and stuttering 05/05/2021   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[facility]) 09/07/2018   Functional dyspepsia 09/08/2017   Slow transit constipation 09/08/2017   S/P placement of cardiac pacemaker    Mobitz II 02/20/2016   Nystagmus, end-position 02/12/2016   Dizziness and giddiness 02/12/2016   Flapping tremor 02/12/2016   Chronic fatigue 10/28/2015   PCP NOTES >>>> 02/13/2015   TBI (traumatic brain injury) 01/28/2015   Essential hypertension    Acute post-traumatic headache, not intractable    Skull fracture ([facility])    Vertigo due to brain injury    SAH (subarachnoid hemorrhage) ([place]) 01/21/2015   Annual physical exam 09/07/2014   Orthostatic hypotension 08/30/2014   Acromioclavicular arthrosis 03/21/2014   Allergic rhinitis 02/27/2014   SI (sacroiliac) joint dysfunction 02/02/2014   Gastrocnemius strain, left 12/11/2013   Back pain 08/22/2013   LBBB (left bundle branch block) 05/13/2013   Shingles outbreak 12/15/2012   Borderline diabetes    Insomnia 01/12/2008   HLD (hyperlipidemia) 02/04/2007   Anxiety 10/12/2006   Osteoarthritis 10/12/2006    REFERRING DIAG: PD (however per recent neurologist's appt, pt does not have PD), neck pain   THERAPY DIAG:  Abnormal posture  Muscle weakness (generalized)  Other abnormalities of gait and mobility  Cervicalgia  PERTINENT HISTORY: arkinsonism anxiety, HLD, HTN, pacemaker, hx of TBI (2016)  PRECAUTIONS: Fall   SUBJECTIVE: Nothing  new, reports the exercises are helping. Can notice when she doesn't do them. When she does the exercises, feels like she is able to hold her head up a little better.   PAIN:  Are you having pain? No     TODAY'S TREATMENT:    Reviewed new bolded exercises added to pt's HEP at last session (per pt's request):  Access Code: [email or web address] URL: [email or web address] Date: 05/19/2021 Prepared by: [name]  Exercises Seated Upper Trapezius Stretch - 2 x daily - 7  x weekly - 1 sets - 3 reps - 30 sec hold Supine Diaphragmatic Breathing - 1-2 x daily - 7 x weekly - 1-2 sets - 10 reps Sidelying Neck Sidebending - 1 x daily - 7 x weekly - 2 sets - 10 reps - performed on both R/L, cued for proper set up and holding for a few seconds.  Supine Bridge - 1 x daily - 7 x weekly - 1 sets - 10 reps  Scapular Retraction with Resistance - 1 x daily - 7 x weekly - 2 sets - 5 reps - pt had not been performing at home as she has not had anywhere to anchor the theraband, showed pt how to anchor in the door, performed x5 reps in standing and 2 x 5 reps seated in chair. Discussed that performing seated in a chair would be the best way to perform. Wrote down/drew out pt instructions for this for incr compliance at home  Seated Shoulder Horizontal Abduction with Resistance - Palms Down - 1 x daily - 7 x weekly - 2 sets - 5 reps - cues for proper technique and scapular retraction  Sit to Stand with Armchair - 1-2 x daily - 5 x weekly - 2 sets - 4 reps Isometric Cervical Extension at Wall with Ball - 1 x daily - 7 x weekly - 2 sets - 5 reps - 10 second hold - with use of towel roll behind head, verbal/demo cues for technique.  Alternating Step Taps with Counter Support - 1-2 x daily - 5 x weekly - 2 sets - 5 reps   Not in HEP: Performed seated diagonals using BUE x5 reps each side, verbal/demo cues for technique. Needed seated rest break between each side.    PATIENT EDUCATION: Education details: Reviewed new HEP additions, adding another appt on Wednesday. Person educated: Patient Education method: Explanation, Demonstration, Tactile cues, and Verbal cues Education comprehension: verbalized understanding, returned demonstration, and needs further education   HOME EXERCISE PROGRAM: [email or web address]   PT Short Term Goals        PT SHORT TERM GOAL #1   Title Pt will be independent in initial HEP with husband supervision in order to build upon functional gains made in therapy.  ALL STGS DUE 03/20/21    Baseline pt needs frequent reminders and review of HEP    Time 4    Period Weeks    Status On-going    Target Date 03/30/21      PT SHORT TERM GOAL #2   Title Cervical ROM to be assessed with STG/LTG written as appropriate.    Time 4    Period Weeks    Status Achieved      PT SHORT TERM GOAL #3   Title Pt will decr TUG time to 17 seconds or less in order to demo decr fall risk.    Baseline 22.72 seconds; 13.93 seconds on 04/01/21    Time 4    Period Weeks  Status Achieved      PT SHORT TERM GOAL #4   Title Will perform push and release test with LTG written in order to demo improved balance reactions.    Time 4    Period Weeks    Status Achieved      PT SHORT TERM GOAL #5   Title Pt will improve gait speed to at least 1.5 ft/sec with no AD vs. LRAD in order to demo decr fall risk.    Baseline 1.09 ft/sec with no AD; 2.32 ft/sec on 04/01/21    Time 4    Period Weeks    Status Achieved      PT SHORT TERM GOAL #6   Title improve cervical Rt sidebending to at least 15 deg for improved mobility    Baseline met - 20 deg of lateral flexion to R    Time 3    Period Weeks    Status Achieved    Target Date 03/30/21              PT Long Term Goals       PT LONG TERM GOAL #1   Title Pt will be independent in final HEP with husband supervision in order to build upon functional gains made in therapy. ALL LTGS DUE 05/28/21    Baseline pt will continue to benefit from review and have pt's husband come back for education.    Time 12    Period Weeks    Status On-going    Target Date 05/28/21      PT LONG TERM GOAL #2   Title Pt will recover posterior and lateral balance in push and release test in 1 robust step independently, for improved balance recovery    Baseline ON 04/29/21: 2 steps posteriorly, 1 robust anterior step. R lateral = 2 steps, L lateral = 3-4 steps    Time 12    Period Weeks    Status New      PT LONG TERM GOAL #3   Title Pt will  improve cervical AROM of R rotation to 35 degrees and L rotation to 45 degrees in order to demo improved functional mobility.    Baseline R: 30 deg, L 40 deg    Time 12    Period Weeks    Status New      PT LONG TERM GOAL #5   Title Pt will ambulate at least 200' over indoor and outdoor unlevel surfaces with supervision with no AD vs. LRAD in order to demo improved community mobility.    Time 12    Period Weeks    Status Revised              Plan     Clinical Impression Statement Reviewed new exercises in seated/standing (per pt request) given as HEP from last session. Pt needing verbal/demo cues for proper technique and education on how to anchor theraband as pt had not been able to perform scap retraction at home. Pt challenged by strengthening exercises for mid/upper back and needing intermittent rest breaks between reps of 5. Will continue to progress towards LTGs.    Personal Factors and Comorbidities Comorbidity 3+;Past/Current Experience;Time since onset of injury/illness/exacerbation    Comorbidities PMH: Parkinsonism, anxiety, HLD, HTN, pacemaker, syncope with collapse resulting in SAH/TBI 2016, aphasia/stuttering    Examination-Activity Limitations Transfers;Stairs;Stand;Locomotion Level    Examination-Participation Restrictions Community Activity;Cleaning;Laundry;Driving    Stability/Clinical Decision Making Evolving/Moderate complexity    Rehab Potential Fair   due to  deficits   PT Frequency 2x / week    PT Duration 12 weeks    PT Treatment/Interventions ADLs/Self Care Home Management;Moist Heat;DME Instruction;Gait training;Stair training;Functional mobility training;Therapeutic activities;Neuromuscular re-education;Balance training;Therapeutic exercise;Patient/family education;Manual techniques;Passive range of motion;Dry needling;Vestibular;Cryotherapy;Electrical Stimulation;Traction;Ultrasound;Taping    PT Next Visit Plan goals and re-cert due 09/06/74.  Continue to  progress postural exercises, upper back/neck extension, would any of the PWR exercises be good for her?    PT Home Exercise Plan Access Code: [email or web address]    [email or web address] and Agree with Plan of Care Patient               Arliss Journey, PT, DPT  05/19/2021, 2:28 PM

## 2021-05-20 ENCOUNTER — Ambulatory Visit (INDEPENDENT_AMBULATORY_CARE_PROVIDER_SITE_OTHER): Payer: PPO | Admitting: *Deleted

## 2021-05-20 DIAGNOSIS — H5509 Other forms of nystagmus: Secondary | ICD-10-CM

## 2021-05-20 DIAGNOSIS — R7303 Prediabetes: Secondary | ICD-10-CM

## 2021-05-20 DIAGNOSIS — G44319 Acute post-traumatic headache, not intractable: Secondary | ICD-10-CM

## 2021-05-20 DIAGNOSIS — F419 Anxiety disorder, unspecified: Secondary | ICD-10-CM

## 2021-05-20 DIAGNOSIS — E785 Hyperlipidemia, unspecified: Secondary | ICD-10-CM

## 2021-05-20 DIAGNOSIS — R42 Dizziness and giddiness: Secondary | ICD-10-CM

## 2021-05-20 DIAGNOSIS — G214 Vascular parkinsonism: Secondary | ICD-10-CM

## 2021-05-20 DIAGNOSIS — M19012 Primary osteoarthritis, left shoulder: Secondary | ICD-10-CM

## 2021-05-21 ENCOUNTER — Ambulatory Visit: Payer: PPO | Admitting: Physical Therapy

## 2021-05-21 ENCOUNTER — Telehealth: Payer: Self-pay | Admitting: *Deleted

## 2021-05-21 ENCOUNTER — Other Ambulatory Visit: Payer: Self-pay

## 2021-05-21 ENCOUNTER — Encounter: Payer: Self-pay | Admitting: Physical Therapy

## 2021-05-21 DIAGNOSIS — M542 Cervicalgia: Secondary | ICD-10-CM

## 2021-05-21 DIAGNOSIS — M6281 Muscle weakness (generalized): Secondary | ICD-10-CM

## 2021-05-21 DIAGNOSIS — R293 Abnormal posture: Secondary | ICD-10-CM

## 2021-05-21 DIAGNOSIS — R2689 Other abnormalities of gait and mobility: Secondary | ICD-10-CM

## 2021-05-21 DIAGNOSIS — R2681 Unsteadiness on feet: Secondary | ICD-10-CM

## 2021-05-21 NOTE — Telephone Encounter (Addendum)
[place] [address] [address] Phone:  [phone]. Spoke w/ Melody.  ?Informed them PA completed and pending decision for carbidopa-levodopa 10-'100mg'$ . She verbalized understanding.  ? ?Called and spoke w/ pt. Relayed above info as well. Pt verbalized understanding. Would like a call once determination made by insurance.  ?

## 2021-05-21 NOTE — Telephone Encounter (Signed)
Received fax back from [facility] [employer] that no PA/qty limit needed. I called [facility] and spoke with Erin. She ran test claim, cost 12.00. Confirmed no PA needed. Advised I will call pt to let her know. ?Asked she d/c any other sinemet prescriptions on file. Should only be 10-'100mg'$  on file now. ? ? ?I called pt and updated her. She verbalized understanding. She will d/c 25-'100mg'$  tablet and start 10-'100mg'$  once she picks up from pharmacy. She will call back if she has any other questions/concerns.  ?

## 2021-05-21 NOTE — Therapy (Signed)
OUTPATIENT PHYSICAL THERAPY TREATMENT NOTE   Patient Name: Mandy Durham MRN: [id number] DOB:[birth year], 76 y.o., female Today's Date: 05/21/2021  PCP: Colon Branch, MD REFERRING PROVIDER: Colon Branch, MD   PT End of Session - 05/21/21 1535     Visit Number 14    Number of Visits 25    Date for PT Re-Evaluation 05/29/21    Authorization Type [employer]    PT Start Time 1534    Activity Tolerance Patient tolerated treatment well    Behavior During Therapy [facility] for tasks assessed/performed             Past Medical History:  Diagnosis Date   Anxiety state, unspecified    Hyperlipidemia    Hypertension    Insomnia    LBBB (left bundle branch block)    LHC in 2002 showed normal coronaries.    Osteoarthrosis, unspecified whether generalized or localized, unspecified site    Overweight(278.02)    Parkinson disease ([place])    S/P placement of cardiac pacemaker    a. 02/21/16: Medtronic Advisa DR MRI SureScan model [id number] (serial number [id number])    Serrated adenoma of colon 2007   Symptomatic menopausal or female climacteric states    Syncope and collapse    11/12: Holter 12/12 with rare PACS, HR range 64-140, average 82, no significant arrhythmias. Echo (1/13): EF 50-55%, mild LVH, septal-lateral dyssynchrony c/w LBBB. 3-week event monitor (1/13): No significant arrhythmia.    Past Surgical History:  Procedure Laterality Date   COLONOSCOPY  2007   EP IMPLANTABLE DEVICE N/A 02/21/2016   Procedure: Pacemaker Implant;  Surgeon: Will Meredith Leeds, MD;  Location: [facility];  Service: Cardiovascular;  Laterality: N/A;   POLYPECTOMY     Patient Active Problem List   Diagnosis Date Noted   Kyphosis due to degeneration of spine 05/05/2021   Drug-induced orofacial dyskinesia 05/05/2021   Hyperkinesis 05/05/2021   Traumatic intracerebral hemorrhage with loss of consciousness of 31 minutes to 59 minutes ([place]) 05/05/2021   Stammering and stuttering 05/05/2021    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[place]) 09/07/2018   Functional dyspepsia 09/08/2017   Slow transit constipation 09/08/2017   S/P placement of cardiac pacemaker    Mobitz II 02/20/2016   Nystagmus, end-position 02/12/2016   Dizziness and giddiness 02/12/2016   Flapping tremor 02/12/2016   Chronic fatigue 10/28/2015   PCP NOTES >>>> 02/13/2015   TBI (traumatic brain injury) 01/28/2015   Essential hypertension    Acute post-traumatic headache, not intractable    Skull fracture (HCC)    Vertigo due to brain injury    SAH (subarachnoid hemorrhage) ([place]) 01/21/2015   Annual physical exam 09/07/2014   Orthostatic hypotension 08/30/2014   Acromioclavicular arthrosis 03/21/2014   Allergic rhinitis 02/27/2014   SI (sacroiliac) joint dysfunction 02/02/2014   Gastrocnemius strain, left 12/11/2013   Back pain 08/22/2013   LBBB (left bundle branch block) 05/13/2013   Shingles outbreak 12/15/2012   Borderline diabetes    Insomnia 01/12/2008   HLD (hyperlipidemia) 02/04/2007   Anxiety 10/12/2006   Osteoarthritis 10/12/2006    REFERRING DIAG: PD (however per recent neurologist's appt, pt does not have PD), neck pain   THERAPY DIAG:  Abnormal posture  Muscle weakness (generalized)  Other abnormalities of gait and mobility  Cervicalgia  Unsteadiness on feet  PERTINENT HISTORY: Parkinsonism anxiety, HLD, HTN, pacemaker, hx of TBI (2016)  PRECAUTIONS: Fall   SUBJECTIVE: Nothing new.   PAIN:  Are you having pain? No (not right  now)     TODAY'S TREATMENT:    Modified quadruped position with mat elevated -2 x 5 reps cat/cows with cues for looking ahead and then verbal/tactile scapular protraction when bringing head into chest.  -PWR Twist x5 reps each side, reaching up (with cues to look up and hands) and then bring hand under body for a twist.   Sit to stands with no UE support, holding long boomwhacker and bringing it overhead when in standing x5 reps. Use of mirror as visual cue for  looking straight ahead for cervical extension in standing and extending arms.   Seated with half foam roller behind pt 2 x 5 reps horizontal ABD with red tband, cues for scap retraction and looking ahead into mirror for upright posture. Needing seated rest break between each rep.   Standing at countertop 2 x 3 reps each side standing with wide BOS and reaching towards sticky note target superiorly and laterally. Cues to look up where hands are reaching.      HOME EXERCISE PROGRAM: [id number]   PT Short Term Goals        PT SHORT TERM GOAL #1   Title Pt will be independent in initial HEP with husband supervision in order to build upon functional gains made in therapy. ALL STGS DUE 03/20/21    Baseline pt needs frequent reminders and review of HEP    Time 4    Period Weeks    Status On-going    Target Date 03/30/21      PT SHORT TERM GOAL #2   Title Cervical ROM to be assessed with STG/LTG written as appropriate.    Time 4    Period Weeks    Status Achieved      PT SHORT TERM GOAL #3   Title Pt will decr TUG time to 17 seconds or less in order to demo decr fall risk.    Baseline 22.72 seconds; 13.93 seconds on 04/01/21    Time 4    Period Weeks    Status Achieved      PT SHORT TERM GOAL #4   Title Will perform push and release test with LTG written in order to demo improved balance reactions.    Time 4    Period Weeks    Status Achieved      PT SHORT TERM GOAL #5   Title Pt will improve gait speed to at least 1.5 ft/sec with no AD vs. LRAD in order to demo decr fall risk.    Baseline 1.09 ft/sec with no AD; 2.32 ft/sec on 04/01/21    Time 4    Period Weeks    Status Achieved      PT SHORT TERM GOAL #6   Title improve cervical Rt sidebending to at least 15 deg for improved mobility    Baseline met - 20 deg of lateral flexion to R    Time 3    Period Weeks    Status Achieved    Target Date 03/30/21              PT Long Term Goals       PT LONG TERM GOAL #1    Title Pt will be independent in final HEP with husband supervision in order to build upon functional gains made in therapy. ALL LTGS DUE 05/28/21    Baseline pt will continue to benefit from review and have pt's husband come back for education.    Time 12    Period  Weeks    Status On-going    Target Date 05/28/21      PT LONG TERM GOAL #2   Title Pt will recover posterior and lateral balance in push and release test in 1 robust step independently, for improved balance recovery    Baseline ON 04/29/21: 2 steps posteriorly, 1 robust anterior step. R lateral = 2 steps, L lateral = 3-4 steps    Time 12    Period Weeks    Status New      PT LONG TERM GOAL #3   Title Pt will improve cervical AROM of R rotation to 35 degrees and L rotation to 45 degrees in order to demo improved functional mobility.    Baseline R: 30 deg, L 40 deg    Time 12    Period Weeks    Status New      PT LONG TERM GOAL #5   Title Pt will ambulate at least 200' over indoor and outdoor unlevel surfaces with supervision with no AD vs. LRAD in order to demo improved community mobility.    Time 12    Period Weeks    Status Revised              Plan     Clinical Impression Statement Continued to work on neck and postural strengthening/ROM for mid/upper back. Pt fatigues easily during session and needed intermittent seated rest breaks. Pt able to tolerate modified quadruped exercises for cervical extension. Will continue to progress towards LTGs.    Personal Factors and Comorbidities Comorbidity 3+;Past/Current Experience;Time since onset of injury/illness/exacerbation    Comorbidities PMH: Parkinsonism, anxiety, HLD, HTN, pacemaker, syncope with collapse resulting in SAH/TBI 2016, aphasia/stuttering    Examination-Activity Limitations Transfers;Stairs;Stand;Locomotion Level    Examination-Participation Restrictions Community Activity;Cleaning;Laundry;Driving    Stability/Clinical Decision Making Evolving/Moderate  complexity    Rehab Potential Fair   due to deficits   PT Frequency 2x / week    PT Duration 12 weeks    PT Treatment/Interventions ADLs/Self Care Home Management;Moist Heat;DME Instruction;Gait training;Stair training;Functional mobility training;Therapeutic activities;Neuromuscular re-education;Balance training;Therapeutic exercise;Patient/family education;Manual techniques;Passive range of motion;Dry needling;Vestibular;Cryotherapy;Electrical Stimulation;Traction;Ultrasound;Taping    PT Next Visit Plan goals and re-cert due 4/00/86.  Continue to progress postural exercises, upper back/neck extension, continue to trial some of the PWR moves.    PT Home Exercise Plan Access Code: [email or web address]    [email or web address] and Agree with Plan of Care Patient               Arliss Journey, PT, DPT  05/21/2021, 3:36 PM

## 2021-05-21 NOTE — Chronic Care Management (AMB) (Addendum)
?Chronic Care Management  ? ? Clinical Social Work Note ? ?05/21/2021 ?Name: Mandy Durham MRN: [id number] DOB: [birth year] ? ?Mandy Durham is a 76 y.o. year old female who is a primary care patient of Larose Kells, Alda Berthold, MD. The CCM team was consulted to assist the patient with chronic disease management and/or care coordination needs related to: [employer], Level of Care Concerns, and Caregiver Stress.  ? ?Engaged with patient by telephone for follow up visit in response to provider referral for social work chronic care management and care coordination services.  ? ?Consent to Services:  ?The patient was given information about Chronic Care Management services, agreed to services, and gave verbal consent prior to initiation of services.  Please see initial visit note for detailed documentation.  ? ?Patient agreed to services and consent obtained.  ? ?Assessment: Review of patient past medical history, allergies, medications, and health status, including review of relevant consultants reports was performed today as part of a comprehensive evaluation and provision of chronic care management and care coordination services.    ? ?SDOH (Social Determinants of Health) assessments and interventions performed:   ? ?Advanced Directives Status: Not addressed in this encounter. ? ?CCM Care Plan ? ?No Known Allergies ? ?Outpatient Encounter Medications as of 05/20/2021  ?Medication Sig  ? acetaminophen (TYLENOL) 500 MG tablet Take 500 mg by mouth every 6 (six) hours as needed.  ? aspirin EC 81 MG tablet Take 81 mg by mouth daily. Swallow whole.  ? budesonide-formoterol (SYMBICORT) 160-4.5 MCG/ACT inhaler Inhale 2 puffs into the lungs 2 (two) times daily.  ? carbidopa-levodopa (SINEMET) 10-100 MG tablet Take 1 tablet by mouth 4 (four) times daily.  ? Cholecalciferol (VITAMIN D PO) Take 1 tablet by mouth 2 (two) times a week. Vitamin D  ? escitalopram (LEXAPRO) 10 MG tablet Take 1 tablet (10 mg total) by mouth daily.  ? ezetimibe  (ZETIA) 10 MG tablet Take 1 tablet (10 mg total) by mouth daily.  ? furosemide (LASIX) 20 MG tablet Take 1 tablet (20 mg total) by mouth daily. Alternating 40 mg every other day  ? losartan (COZAAR) 100 MG tablet Take 1 tablet (100 mg total) by mouth daily.  ? nitroGLYCERIN (NITROSTAT) 0.4 MG SL tablet Place 1 tablet (0.4 mg total) under the tongue every 5 (five) minutes x 3 doses as needed for chest pain.  ? ?No facility-administered encounter medications on file as of 05/20/2021.  ? ? ?Patient Active Problem List  ? Diagnosis Date Noted  ? Kyphosis due to degeneration of spine 05/05/2021  ? Drug-induced orofacial dyskinesia 05/05/2021  ? Hyperkinesis 05/05/2021  ? Traumatic intracerebral hemorrhage with loss of consciousness of 31 minutes to 59 minutes (HCC) 05/05/2021  ? Stammering and stuttering 05/05/2021  ? 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[place]) 09/07/2018  ? Functional dyspepsia 09/08/2017  ? Slow transit constipation 09/08/2017  ? S/P placement of cardiac pacemaker   ? Mobitz II 02/20/2016  ? Nystagmus, end-position 02/12/2016  ? Dizziness and giddiness 02/12/2016  ? Flapping tremor 02/12/2016  ? Chronic fatigue 10/28/2015  ? PCP NOTES >>>> 02/13/2015  ? TBI (traumatic brain injury) 01/28/2015  ? Essential hypertension   ? Acute post-traumatic headache, not intractable   ? Skull fracture ([place])   ? Vertigo due to brain injury   ? SAH (subarachnoid hemorrhage) ([place]) 01/21/2015  ? Annual physical exam 09/07/2014  ? Orthostatic hypotension 08/30/2014  ? Acromioclavicular arthrosis 03/21/2014  ? Allergic rhinitis 02/27/2014  ? SI (sacroiliac) joint dysfunction 02/02/2014  ?  Gastrocnemius strain, left 12/11/2013  ? Back pain 08/22/2013  ? LBBB (left bundle branch block) 05/13/2013  ? Shingles outbreak 12/15/2012  ? Borderline diabetes   ? Insomnia 01/12/2008  ? HLD (hyperlipidemia) 02/04/2007  ? Anxiety 10/12/2006  ? Osteoarthritis 10/12/2006  ? ? ?Conditions to be addressed/monitored: Anxiety, Vascular Parkinsonism, and  Traumatic Brain Injury.  Limited Social Support, Level of Care Concerns, ADL/IADL Limitations, Social Isolation, Limited Access to Caregiver, Cognitive Deficits, Memory Deficits, and Lacks Knowledge of [employer]. ? ?Care Plan : LCSW Plan of Care  ?Updates made by Francis Gaines, LCSW since 05/21/2021 12:00 AM  ?  ? ?Problem: Find Help in My Community.   ?Priority: High  ?  ? ?Goal: Find Help in My Community.   ?Start Date: 04/30/2021  ?Expected End Date: 07/28/2021  ?This Visit's Progress: On track  ?Recent Progress: On track  ?Priority: High  ?Note:   ?Current Barriers:   ?Patient with Hypertension, Vascular Parkinsonism, Traumatic Brain Injury, Subarachnoid Hemorrhage, Skull Fracture, Dizziness, Giddiness, Anxiety, and Chronic Fatigue, needs Support, Education, Resources, Referrals, Advocacy, and Care Coordination, to resolve unmet personal care needs in the home. ?Patient is unable to self-administer medications as prescribed, or consistently perform ADL's/IADL's independently. ?Lacks knowledge of available community agencies and resources. ?Clinical Goals:  ?Patient will have [place] in place, either through [facility], Education officer, museum II with the [place] Program, or through a private agency of choice. ?Patient will work with LCSW and Rosealee Albee, Education officer, museum II with the [place] Program, to coordinate care for [employer]. ?Patient will demonstrate improved health management independence as evidenced by having [place] in place. ?Interventions: ?Collaboration with Primary Care Provider, Dr. Kathlene November regarding development and update of comprehensive plan of care as evidenced by provider attestation and co-signature. ?Inter-disciplinary care team collaboration (see longitudinal plan of care). ?Clinical Interventions: ?Caregiver Stress  Acknowledged. ?Emotional Support Provided, and Verbalization of Feelings Emphasized. ?LCSW continued collaboration with Rosealee Albee, Social Worker II with the [place] Program, to verify waiting list status. ?Patient Goals/Self-Care Activities:  ?Patient will continue to work with LCSW, on a bi-weekly basis, until approved for [employer], or until services are in place through a private agency of choice.   ?Patient will continue to periodically follow-up with Rosealee Albee, Social Worker II with the [place] Program [phone]), to check the status of her name on the waiting list to receive In-Home Aide Services.   ?Patient will continue contacting agencies of interest, from the following list provided:     ?~ [place] ?~ [place] ?~ [place] [facility] ?~ [place] ?Patient will continue to consider self-enrollment in activities of interest, offered through [employer] [facility], as well as [place]. ?Patient will contact LCSW (# [phone]) directly, if she has questions, needs assistance, or if additional social work needs are identified between now and our next scheduled telephone outreach call.  ?Follow-Up Plan:  LCSW will follow-up with patient by telephone on 06/03/2021 at 3:30 pm.  ?Nat Christen LCSW ?Licensed Clinical Social Worker ?[place] ?[phone]  ? ? ?I have reviewed and agree with [facility] documentation.  ?Kathlene November, MD ? ?

## 2021-05-21 NOTE — Telephone Encounter (Signed)
Spoke with Dr. Brett Fairy. Due to tolerability issues on carbidopa levodopa 25-100 at TID and QID dosing, wanting to try her at lower dose of 10-'100mg'$ . I submitted PA on CMM. Key: [id number]. Waiting on determination from [place] [employer]. ?

## 2021-05-21 NOTE — Telephone Encounter (Addendum)
Called pt to let her know insurance is requiring PA for sinemet 10-'100mg'$  that Dr. Brett Fairy called in for her at last visit on 05/05/21.  ? ?Confirmed she is currently taking carbidopa-levodopa 25-'100mg'$ , 1 po three times daily. This dose has not helped she said. Not having any SE.  States Dr. Brett Fairy changed to lower dose but she is not sure why, asked we further discuss with Dr. Brett Fairy to see how to proceed.  ?She would also like [place] to update [place] once we know plan moving forward and call her back.  ? ?She has PT appt this afternoon, ok to leave detailed message if she does not pick up.  ? ? ?

## 2021-05-21 NOTE — Patient Instructions (Signed)
Visit Information ? ?Thank you for taking time to visit with me today. Please don't hesitate to contact me if I can be of assistance to you before our next scheduled telephone appointment. ? ?Following are the goals we discussed today:  ?Patient Goals/Self-Care Activities:  ?Patient will continue to work with LCSW, on a bi-weekly basis, until approved for [employer], or until services are in place through a private agency of choice.   ?Patient will continue to periodically follow-up with Rosealee Albee, Social Worker II with the [place] Program [phone]), to check the status of her name on the waiting list to receive In-Home Aide Services.   ?Patient will continue contacting agencies of interest, from the following list provided:     ?~ [place] ?~ [place] ?~ [place] [facility] ?~ [place] ?Patient will continue to consider self-enrollment in activities of interest, offered through [employer] of [facility], as well as [place]. ?Patient will contact LCSW (# [phone]) directly, if she has questions, needs assistance, or if additional social work needs are identified between now and our next scheduled telephone outreach call.  ?Follow-Up Plan:  LCSW will follow-up with patient by telephone on 06/03/2021 at 3:30 pm. ? ?Please call the care guide team at [phone] if you need to cancel or reschedule your appointment.  ? ?If you are experiencing a Mental Health or [place] or need someone to talk to, please call the Suicide and Crisis Lifeline: 988 ?call the [place] National Suicide Prevention Lifeline: 313 523 4960 or TTY: 316-700-9263 TTY (249) 299-8893) to talk to a trained counselor ?call 1-800-273-TALK (toll free, 24 hour hotline) ?go to [facility] [address], [place]  [phone]) ?call the [facility]: [phone] ?call 911  ? ?Patient verbalizes understanding of instructions and care plan provided today and agrees to view in [place]. Active MyChart status confirmed with patient.   ? ?Nat Christen LCSW ?Licensed Clinical Social Worker ?[place] ?[phone]  ?

## 2021-05-26 ENCOUNTER — Ambulatory Visit (INDEPENDENT_AMBULATORY_CARE_PROVIDER_SITE_OTHER): Payer: PPO | Admitting: Internal Medicine

## 2021-05-26 ENCOUNTER — Encounter: Payer: Self-pay | Admitting: Internal Medicine

## 2021-05-26 VITALS — BP 132/76 | HR 76 | Temp 98.0°F | Resp 18 | Ht 64.0 in | Wt 105.5 lb

## 2021-05-26 DIAGNOSIS — F8081 Childhood onset fluency disorder: Secondary | ICD-10-CM | POA: Diagnosis not present

## 2021-05-26 DIAGNOSIS — R739 Hyperglycemia, unspecified: Secondary | ICD-10-CM

## 2021-05-26 DIAGNOSIS — I5032 Chronic diastolic (congestive) heart failure: Secondary | ICD-10-CM

## 2021-05-26 DIAGNOSIS — Z950 Presence of cardiac pacemaker: Secondary | ICD-10-CM

## 2021-05-26 DIAGNOSIS — E785 Hyperlipidemia, unspecified: Secondary | ICD-10-CM

## 2021-05-26 LAB — LIPID PANEL
Cholesterol: 200 mg/dL (ref 0–200)
HDL: 108.7 mg/dL (ref >39.00)
LDL Cholesterol: 76 mg/dL (ref 0–99)
NonHDL: 90.85
Total CHOL/HDL Ratio: 2
Triglycerides: 75 mg/dL (ref 0.0–149.0)
VLDL: 15 mg/dL (ref 0.0–40.0)

## 2021-05-26 LAB — ALT: ALT: 5 U/L (ref 0–35)

## 2021-05-26 LAB — HEMOGLOBIN A1C: Hgb A1c MFr Bld: 6.4 % (ref 4.6–6.5)

## 2021-05-26 LAB — AST: AST: 14 U/L (ref 0–37)

## 2021-05-26 NOTE — Patient Instructions (Addendum)
Check the  blood pressure regularly ?BP GOAL is between 110/65 and  135/85. ?If it is consistently higher or lower, let me know ? ?Vaccines I recommend: ?Tetanus shot ?Shingrix ?Consider COVID vaccine booster  ?GO TO THE LAB : Get the blood work   ? ? ?[place], [place] ?Come back for a checkup in 4 to 6 months ? ?"Living will", "Health Care Power of attorney": Advanced care planning ? ?(If you already have a living will or healthcare power of attorney, please bring the copy to be scanned in your chart.) ? ?Advance care planning is a process that supports adults in  understanding and sharing their preferences regarding future medical care.  ? ?The patient's preferences are recorded in documents called Advance Directives.    ?Advanced directives are completed (and can be modified at any time) while the patient is in full mental capacity.  ? ?The documentation should be available at all times to the patient, the family and the healthcare providers.  ?Bring in a copy to be scanned in your chart is an excellent idea and is recommended  ? ?This legal documents direct treatment decision making and/or appoint a surrogate to make the decision if the patient is not capable to do so.  ? ? ?Advance directives can be documented in many types of formats,  documents have names such as:  ?Lliving will  ?Durable power of attorney for healthcare (healthcare proxy or healthcare power of attorney)  ?Combined directives  ?Physician orders for life-sustaining treatment  ?  ?More information at: ? ?[email or web address]  ?

## 2021-05-26 NOTE — Progress Notes (Unsigned)
Subjective:    Patient ID: Mandy Durham, female    DOB: [birth year], 76 y.o.   MRN: [id number]  DOS:  05/26/2021 Type of visit - description: Follow-up  Here for routine follow-up. Since the last visit has seen neurology, cardiology, pulmonary.  Notes reviewed. Reports that overall has no major concerns and is feeling okay. Emotionally is doing well as well.  Review of Systems  Denies any chest pain, DOE at baseline. No nausea vomiting.  No blood in the stools.  Past Medical History:  Diagnosis Date   Anxiety state, unspecified    Hyperlipidemia    Hypertension    Insomnia    LBBB (left bundle branch block)    LHC in 2002 showed normal coronaries.    Osteoarthrosis, unspecified whether generalized or localized, unspecified site    Overweight(278.02)    Parkinson disease ([place])    S/P placement of cardiac pacemaker    a. 02/21/16: Medtronic Advisa DR MRI SureScan model [id number] (serial number [id number])    Serrated adenoma of colon 2007   Symptomatic menopausal or female climacteric states    Syncope and collapse    11/12: Holter 12/12 with rare PACS, HR range 64-140, average 82, no significant arrhythmias. Echo (1/13): EF 50-55%, mild LVH, septal-lateral dyssynchrony c/w LBBB. 3-week event monitor (1/13): No significant arrhythmia.     Past Surgical History:  Procedure Laterality Date   COLONOSCOPY  2007   EP IMPLANTABLE DEVICE N/A 02/21/2016   Procedure: Pacemaker Implant;  Surgeon: Will Meredith Leeds, MD;  Location: [facility];  Service: Cardiovascular;  Laterality: N/A;   POLYPECTOMY      Current Outpatient Medications  Medication Instructions   acetaminophen (TYLENOL) 500 mg, Oral, Every 6 hours PRN   aspirin EC 81 mg, Daily   budesonide-formoterol (SYMBICORT) 160-4.5 MCG/ACT inhaler 2 puffs, Inhalation, 2 times daily   carbidopa-levodopa (SINEMET) 10-100 MG tablet 1 tablet, Oral, 4 times daily   Cholecalciferol (VITAMIN D PO) 1 tablet, Oral, 2 times  weekly, Vitamin D   escitalopram (LEXAPRO) 10 mg, Oral, Daily   ezetimibe (ZETIA) 10 mg, Oral, Daily   furosemide (LASIX) 20 mg, Oral, Daily, Alternating 40 mg every other day   losartan (COZAAR) 100 mg, Oral, Daily   nitroGLYCERIN (NITROSTAT) 0.4 mg, Sublingual, Every 5 min x3 PRN       Objective:   Physical Exam BP 132/76 (BP Location: Left Arm, Patient Position: Sitting, Cuff Size: Small)    Pulse 76    Temp 98 F (36.7 C) (Oral)    Resp 18    Ht '5\' 4"'$  (1.626 m)    Wt 105 lb 8 oz (47.9 kg)    SpO2 97%    BMI 18.11 kg/m  General:   Chronically ill but not toxic appearing.  Underweight appearing. HEENT:  Normocephalic . Face symmetric, atraumatic Lungs:  CTA B Normal respiratory effort, no intercostal retractions, no accessory muscle use. Heart: RRR,  no murmur.  Lower extremities: no pretibial edema bilaterally  Skin: Not pale. Not jaundice Neurologic:  alert & oriented X3.  Speech normal, gait appropriate for age and unassisted.  Tremor mostly head and neck noted. Psych--  Cognition and judgment appear intact.  Cooperative with normal attention span and concentration.  Behavior appropriate. No anxious or depressed appearing.      Assessment      Assessment Prediabetes  HTN : amlodipine: edema 10/2018 Hyperlipidemia, statin intolerant Anxiety, insomnia   Menopausal; DEXA 2012 -1.5 @ gyn Recurrent  Syncope --3:1 AV Block pacemaker 02/21/16 -- extensive cards eval before, DX with autonomic insufficiency ~ 02-2015 --on a  abd binder , pyridostigmine, rx midodrine 06-2015 per cards  --CT coronary morphology with CTA and coronary score 01/12/2018: No significant disease Traumatic SAH (after a syncope),TBI--- admitted 01-2015  -- was rx seroquel (aparently for ICU delirium) -- d/c 07-2015 --rx topamax when at rehab for post [facility]  HAs -- had issues w/ b/b incontinence, better as of 07-2015 Tremors LUE, onset 9 -2017 Osteopneia: on Boniva, rx elsewhere DOE/CP: Low risk  Myoview, echo done 10/20/2019, saw cards 05/2020 and pulmonary 07/2020: multifactorial, PFTx ordered   PLAN  FLP AST ALT A1c  Hyperglycemia: Check A1c. HTN: On multiple meds, BP is okay, last BMP satisfactory.  No change. Expressive aphasia, stuttering, tremor, question of Parkinson Saw neurology 05/05/2021: Noted to have some facial and oral muscle alclometasone, fine tremor, limited response to Sinemet. They also ordered a DAT scan and a brain MRI. High cholesterol: On Zetia, checking labs AV block, pacemaker, diastolic CHF: Saw cardiology 03/26/2021, felt to be stable.  On aspirin, recommend to discuss with cardiology the appropriateness of continue aspirin Anxiety: Currently well controlled on Lexapro   DOE: Saw pulmonary 02/20/2021, DOE likely multifactorial, did trial with budesonide/formoterol, did not work, she will stop the inhaler. Neck pain, chronic: Referred to PT by pulmonary. Preventive care Vaccines she might benefit from: Tdap, Shingrix, COVID booster.  Last MMG 2020, declines a referral CCS: Last colonoscopy 2019, due for a repeat.  GI letter printed.  Doubt she likes to proceed DEXA: No recent DEXA, declines a referral ACP: Discussed with patient RTC 4 to 6 months 11-9 Prediabetes: Check A1c HTN: Currently on losartan, started to take Lasix regularly 3 to 4 days ago.  Recheck a BMP to be sure her potassium is okay.  Not taking any potassium supplements History of AV block, pacemaker: Saw cardiology 01/17/2021.  PPM with intact function.  She was recommended to take Lasix daily as prescribed. BNP and  BMP were satisfactory. Anxiety: Restarted escitalopram 10/03/2020, feeling better, will talk about possibly increase the dose and she agreed.  Rx escitalopram 10 mg. Tremor, expressive aphasia Saw neurology 10/28/2020, was recommended to continue Sinemet for tremor. Was recommended treatment for depression. Expressive aphasia with a stuttering: Was referred to ST Failure to  thrive: She seems a slightly more frail today.  still driving on limited basis, still taking care of her husband. I think that in the future she will need assistance, encouraged her to discuss that with the family. Preventive care: Flu shot today, recommend Covid vaccine. RTC 4 months   This visit occurred during the SARS-CoV-2 public health emergency.  Safety protocols were in place, including screening questions prior to the visit, additional usage of staff PPE, and extensive cleaning of exam room while observing appropriate contact time as indicated for disinfecting solutions.

## 2021-05-27 ENCOUNTER — Telehealth: Payer: Self-pay | Admitting: Neurology

## 2021-05-27 ENCOUNTER — Ambulatory Visit: Payer: PPO | Admitting: Podiatry

## 2021-05-27 ENCOUNTER — Other Ambulatory Visit: Payer: Self-pay

## 2021-05-27 ENCOUNTER — Encounter: Payer: Self-pay | Admitting: Podiatry

## 2021-05-27 DIAGNOSIS — M79671 Pain in right foot: Secondary | ICD-10-CM | POA: Diagnosis not present

## 2021-05-27 DIAGNOSIS — B351 Tinea unguium: Secondary | ICD-10-CM

## 2021-05-27 DIAGNOSIS — I5030 Unspecified diastolic (congestive) heart failure: Secondary | ICD-10-CM | POA: Insufficient documentation

## 2021-05-27 DIAGNOSIS — M79674 Pain in right toe(s): Secondary | ICD-10-CM | POA: Diagnosis not present

## 2021-05-27 DIAGNOSIS — L84 Corns and callosities: Secondary | ICD-10-CM | POA: Diagnosis not present

## 2021-05-27 DIAGNOSIS — M79675 Pain in left toe(s): Secondary | ICD-10-CM

## 2021-05-27 DIAGNOSIS — L853 Xerosis cutis: Secondary | ICD-10-CM

## 2021-05-27 DIAGNOSIS — M79672 Pain in left foot: Secondary | ICD-10-CM

## 2021-05-27 NOTE — Assessment & Plan Note (Signed)
Preventive care reviewed ?Vaccines she might benefit from: Tdap, Shingrix, COVID booster. ? Last MMG 2020, declines a referral ?CCS: Last colonoscopy 2019, due for a repeat.  GI letter printed.  Doubt she likes to proceed ?DEXA: No recent DEXA, declines a referral ?ACP: Discussed with patient ?

## 2021-05-27 NOTE — Assessment & Plan Note (Signed)
Hyperglycemia: Check A1c. ?HTN: On multiple meds, BP is okay, last BMP satisfactory.  No change. ?Expressive aphasia, stuttering, tremor, question of Parkinson ?Saw neurology 05/05/2021: Noted to have a limited response to Sinemet. ?They also ordered a DAT scan and a brain MRI. ?High cholesterol: On Zetia, checking labs ?AV block, pacemaker, diastolic CHF: ?Saw cardiology 03/26/2021, felt to be stable.  On aspirin, recommend to discuss with cardiology the appropriateness of continue aspirin ?Anxiety: Currently well controlled on Lexapro ?DOE: Saw pulmonary 02/20/2021, DOE likely multifactorial, did trial with budesonide/formoterol, did not work, she will stop the inhaler. ?Neck pain, chronic: Referred to PT by pulmonary. ?Preventive care  reviewed ? RTC 4 to 6 months  ?

## 2021-05-27 NOTE — Patient Instructions (Signed)
Apply Bag Balm Moisturizer to both feet once daily. Put socks and shoes on immediately after use. Do not walk barefoot after application as you could slip and fall.  ?

## 2021-05-28 ENCOUNTER — Ambulatory Visit: Payer: PPO | Admitting: Physical Therapy

## 2021-05-28 ENCOUNTER — Ambulatory Visit (HOSPITAL_COMMUNITY)
Admission: RE | Admit: 2021-05-28 | Discharge: 2021-05-28 | Disposition: A | Discharge status: Home / Self Care | Payer: PPO | Source: Ambulatory Visit | Attending: Neurology | Admitting: Neurology

## 2021-05-28 DIAGNOSIS — S06342S Traumatic hemorrhage of right cerebrum with loss of consciousness of 31 minutes to 59 minutes, sequela: Secondary | ICD-10-CM | POA: Insufficient documentation

## 2021-05-28 DIAGNOSIS — M40209 Unspecified kyphosis, site unspecified: Secondary | ICD-10-CM | POA: Insufficient documentation

## 2021-05-28 DIAGNOSIS — G2401 Drug induced subacute dyskinesia: Secondary | ICD-10-CM | POA: Diagnosis not present

## 2021-05-28 DIAGNOSIS — F8081 Childhood onset fluency disorder: Secondary | ICD-10-CM | POA: Diagnosis not present

## 2021-05-28 DIAGNOSIS — M479 Spondylosis, unspecified: Secondary | ICD-10-CM | POA: Insufficient documentation

## 2021-05-28 DIAGNOSIS — F909 Attention-deficit hyperactivity disorder, unspecified type: Secondary | ICD-10-CM | POA: Diagnosis not present

## 2021-05-28 MED ORDER — POTASSIUM IODIDE (ANTIDOTE) 130 MG PO TABS: 130.0000 mg | ORAL_TABLET | Freq: Once | ORAL | Status: DC

## 2021-05-28 MED ORDER — POTASSIUM IODIDE (ANTIDOTE) 130 MG PO TABS
ORAL_TABLET | ORAL | Status: AC
  Filled 2021-05-28: qty 1

## 2021-05-28 MED ORDER — IOFLUPANE I 123 185 MBQ/2.5ML IV SOLN
3.5800 | Freq: Once | INTRAVENOUS | Status: AC | PRN
Start: 2021-05-28 — End: 2021-05-28
  Administered 2021-05-28: 3.58 via INTRAVENOUS
  Filled 2021-05-28: qty 5

## 2021-05-29 DIAGNOSIS — I619 Nontraumatic intracerebral hemorrhage, unspecified: Secondary | ICD-10-CM | POA: Diagnosis not present

## 2021-05-29 DIAGNOSIS — R4701 Aphasia: Secondary | ICD-10-CM | POA: Diagnosis not present

## 2021-05-29 DIAGNOSIS — G20 Parkinson's disease: Secondary | ICD-10-CM | POA: Diagnosis not present

## 2021-05-29 NOTE — Progress Notes (Signed)
IMPRESSION: abnormal dopamine distribution and production-  ? ?Severe decreased radiotracer activity within the bilateral putamen ?and significant decreased activity in the heads of caudate nuclei. ? ?This pattern of reduced striatal activity has been associated with ?Parkinsonian syndrome pathology.

## 2021-05-30 ENCOUNTER — Other Ambulatory Visit: Payer: Self-pay

## 2021-05-30 ENCOUNTER — Encounter: Payer: Self-pay | Admitting: Physical Therapy

## 2021-05-30 ENCOUNTER — Ambulatory Visit: Payer: PPO | Admitting: Physical Therapy

## 2021-05-30 DIAGNOSIS — M542 Cervicalgia: Secondary | ICD-10-CM

## 2021-05-30 DIAGNOSIS — R293 Abnormal posture: Secondary | ICD-10-CM

## 2021-05-30 DIAGNOSIS — M6281 Muscle weakness (generalized): Secondary | ICD-10-CM

## 2021-05-30 DIAGNOSIS — R2689 Other abnormalities of gait and mobility: Secondary | ICD-10-CM

## 2021-05-30 DIAGNOSIS — R2681 Unsteadiness on feet: Secondary | ICD-10-CM

## 2021-05-30 NOTE — Therapy (Signed)
?OUTPATIENT PHYSICAL THERAPY TREATMENT NOTE/RE-CERT ? ? ?Patient Name: Mandy Durham ?MRN: [id number] ?DOB:February 27, 1946, 76 y.o., female ?Today's Date: 05/30/2021 ? ?PCP: Colon Branch, MD ?REFERRING PROVIDER: Ward Givens, NP ? ? ? 05/30/21 1323  ?PT Visits / Re-Eval  ?Visit Number 15  ?Number of Visits 31  ?Date for PT Re-Evaluation 08/29/21  ?Authorization  ?Authorization Type [employer]  ?PT Time Calculation  ?PT Start Time 7858 ?(pt late to appt)  ?PT Stop Time 1400  ?PT Time Calculation (min) 39 min  ?PT - End of Session  ?Activity Tolerance Patient tolerated treatment well  ?Behavior During Therapy [facility] for tasks assessed/performed  ? ? ? ?Past Medical History:  ?Diagnosis Date  ? Anxiety state, unspecified   ? Hyperlipidemia   ? Hypertension   ? Insomnia   ? LBBB (left bundle branch block)   ? LHC in 2002 showed normal coronaries.   ? Osteoarthrosis, unspecified whether generalized or localized, unspecified site   ? Overweight(278.02)   ? Parkinson disease ([place])   ? S/P placement of cardiac pacemaker   ? a. 02/21/16: Medtronic Advisa DR MRI SureScan model [id number] (serial number [id number])   ? Serrated adenoma of colon 2007  ? Symptomatic menopausal or female climacteric states   ? Syncope and collapse   ? 11/12: Holter 12/12 with rare PACS, HR range 64-140, average 82, no significant arrhythmias. Echo (1/13): EF 50-55%, mild LVH, septal-lateral dyssynchrony c/w LBBB. 3-week event monitor (1/13): No significant arrhythmia.   ? ?Past Surgical History:  ?Procedure Laterality Date  ? COLONOSCOPY  2007  ? EP IMPLANTABLE DEVICE N/A 02/21/2016  ? Procedure: Pacemaker Implant;  Surgeon: Will Meredith Leeds, MD;  Location: [place];  Service: Cardiovascular;  Laterality: N/A;  ? POLYPECTOMY    ? ?Patient Active Problem List  ? Diagnosis Date Noted  ? Diastolic CHF ([place]) 85/04/7739  ? Kyphosis due to degeneration of spine 05/05/2021  ? Drug-induced orofacial dyskinesia 05/05/2021  ? Hyperkinesis  05/05/2021  ? Traumatic intracerebral hemorrhage with loss of consciousness of 31 minutes to 59 minutes (HCC) 05/05/2021  ? Stammering and stuttering 05/05/2021  ? 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 Functional dyspepsia 09/08/2017  ? Slow transit constipation 09/08/2017  ? S/P placement of cardiac pacemaker   ? Mobitz II 02/20/2016  ? Nystagmus, end-position 02/12/2016  ? Dizziness and giddiness 02/12/2016  ? Flapping tremor 02/12/2016  ? Chronic fatigue 10/28/2015  ? PCP NOTES >>>> 02/13/2015  ? TBI (traumatic brain injury) 01/28/2015  ? Essential hypertension   ? Acute post-traumatic headache, not intractable   ? Vertigo due to brain injury   ? SAH (subarachnoid hemorrhage) ([place]) 01/21/2015  ? Annual physical exam 09/07/2014  ? Orthostatic hypotension 08/30/2014  ? Acromioclavicular arthrosis 03/21/2014  ? Allergic rhinitis 02/27/2014  ? SI (sacroiliac) joint dysfunction 02/02/2014  ? Gastrocnemius strain, left 12/11/2013  ? Back pain 08/22/2013  ? LBBB (left bundle branch block) 05/13/2013  ? Shingles outbreak 12/15/2012  ? Borderline diabetes   ? Insomnia 01/12/2008  ? HLD (hyperlipidemia) 02/04/2007  ? Anxiety 10/12/2006  ? Osteoarthritis 10/12/2006  ? ? ?REFERRING DIAG: PD (however per recent neurologist's appt, pt does not have PD), neck pain  ? ?THERAPY DIAG:  ?Abnormal posture ? ?Muscle weakness (generalized) ? ?Other abnormalities of gait and mobility ? ?Cervicalgia ? ?Unsteadiness on feet ? ?PERTINENT HISTORY: Parkinsonism, anxiety, HLD, HTN, pacemaker, hx of TBI (2016) ? ?PRECAUTIONS: Fall  ? ?SUBJECTIVE: Had the DAT scan recently this week. Has  not yet gotten the results back. Has a nerve conduction study on Monday. Asking about notes from Dr. Windy Carina office.  ? ?PAIN:  ?Are you having pain? Yes ?NPRS scale: 4/10 ?Pain location: back of the neck  ?Pain orientation: Posterior  ?PAIN TYPE: tightness and sore ? ?Aggravating factors: nothing ?Relieving factors: ibuprofen  ? ? ? 06/02/21 1152   ?Assessment  ?Medical Diagnosis Parkinsonism, neck pain  ?Referring Provider (PT) Ward Givens, NP  ?Prior Function  ?Level of Independence Needs assistance with homemaking;Independent with household mobility without device  ? ? ?TODAY'S TREATMENT:  ? ? ? 05/30/21 1332  ?AROM  ?Cervical Extension 12  ?Cervical - Right Side Bend 30  ?Cervical - Left Side Bend 32  ?Cervical - Right Rotation 40  ?Cervical - Left Rotation 40  ?Transfers  ?Comments 30 second chair stand: 13 sit <> stands no UE support (above age related norms)  ?Standardized Balance Assessment  ?Standardized Balance Assessment Mini-BESTest  ?Mini-BESTest  ?Sit To Stand 2  ?Rise to Toes 1  ?Stand on one leg (left) 0  ?Stand on one leg (right) 1 ?(6, 1.8)  ?Stand on one leg - lowest score 0  ?Compensatory Stepping Correction - Forward 2  ?Compensatory Stepping Correction - Backward 1 ?(2 steps)  ?Compensatory Stepping Correction - Left Lateral 1 ?(2 steps)  ?Compensatory Stepping Correction - Right Lateral 0 ?(unable to step)  ?Stepping Corredtion Lateral - lowest score 0  ?Stance - Feet together, eyes open, firm surface  2  ?Stance - Feet together, eyes closed, foam surface  0  ? ?PATIENT EDUCATION: ?Education details: Educated on POC - will also focus more on balance going forward based on miniBEST so far and will continue to address postural strengthening/ROM and upper/mid back strengthening as well. Pt keeps asking therapy "how did I get like this?" - PT stating that she does not have an answer for this, but pt's neurologist is still having pt undergo tests/imaging to help figure out a possible diagnosis. Provided emotional support during session as pt is frustrated with her posture and that she is not getting back to normal. Encouraged pt that she is gradually making progress and her ROM has gotten better since starting therapy and her strength has improved with sit <> stands.  ?Person educated: Patient ?Education method: Explanation ?Education  comprehension: verbalized understanding ? ? ?HOME EXERCISE PROGRAM: ?[id number] ? ? PT Short Term Goals    ? ?  ? PT SHORT TERM GOAL #1  ? Title Pt will be independent in initial HEP with husband supervision in order to build upon functional gains made in therapy. ALL STGS DUE 03/20/21   ? Baseline pt needs frequent reminders and review of HEP   ? Time 4   ? Period Weeks   ? Status On-going   ? Target Date 03/30/21   ?  ? PT SHORT TERM GOAL #2  ? Title Cervical ROM to be assessed with STG/LTG written as appropriate.   ? Time 4   ? Period Weeks   ? Status Achieved   ?  ? PT SHORT TERM GOAL #3  ? Title Pt will decr TUG time to 17 seconds or less in order to demo decr fall risk.   ? Baseline 22.72 seconds; 13.93 seconds on 04/01/21   ? Time 4   ? Period Weeks   ? Status Achieved   ?  ? PT SHORT TERM GOAL #4  ? Title Will perform push and release test with LTG written  in order to demo improved balance reactions.   ? Time 4   ? Period Weeks   ? Status Achieved   ?  ? PT SHORT TERM GOAL #5  ? Title Pt will improve gait speed to at least 1.5 ft/sec with no AD vs. LRAD in order to demo decr fall risk.   ? Baseline 1.09 ft/sec with no AD; 2.32 ft/sec on 04/01/21   ? Time 4   ? Period Weeks   ? Status Achieved   ?  ? PT SHORT TERM GOAL #6  ? Title improve cervical Rt sidebending to at least 15 deg for improved mobility   ? Baseline met - 20 deg of lateral flexion to R   ? Time 3   ? Period Weeks   ? Status Achieved   ? Target Date 03/30/21   ? ?  ?  ? ?  ? ?SHORT TERM GOALS: (updated for re-cert) ALL STGS DUE 1/61/09 ? ?Pt will finish further assessment of miniBEST with LTG written. ?Baseline: need to finish ?Target date: 06/30/2021 ?Goal status: INITIAL ? ?2.  Pt will consistently report pain in neck as 2/10 or less in order to demo decr pain for functional mobility/gait. ?Baseline: 4/10 ?Target date: 06/30/2021 ?Goal status: INITIAL ? ?3.  Pt will recover posterior balance in push and release test in 1 robust step independently,  for improved balance recovery  ?Baseline: 2 steps ?Target date: 06/30/2021 ?Goal status: INITIAL ? ?4.  Pt will improve cervical extension AROM to at least 15 deg in order to demo improved AROM for functional mobility.

## 2021-06-02 ENCOUNTER — Telehealth: Payer: Self-pay | Admitting: *Deleted

## 2021-06-02 ENCOUNTER — Encounter: Payer: PPO | Admitting: Neurology

## 2021-06-02 ENCOUNTER — Telehealth: Payer: Self-pay | Admitting: Neurology

## 2021-06-02 ENCOUNTER — Telehealth: Payer: PPO | Admitting: *Deleted

## 2021-06-02 ENCOUNTER — Ambulatory Visit (INDEPENDENT_AMBULATORY_CARE_PROVIDER_SITE_OTHER): Payer: PPO | Admitting: Neurology

## 2021-06-02 ENCOUNTER — Other Ambulatory Visit: Payer: Self-pay

## 2021-06-02 DIAGNOSIS — M542 Cervicalgia: Secondary | ICD-10-CM | POA: Diagnosis not present

## 2021-06-02 DIAGNOSIS — R531 Weakness: Secondary | ICD-10-CM | POA: Diagnosis not present

## 2021-06-02 DIAGNOSIS — S06342S Traumatic hemorrhage of right cerebrum with loss of consciousness of 31 minutes to 59 minutes, sequela: Secondary | ICD-10-CM

## 2021-06-02 DIAGNOSIS — Z0289 Encounter for other administrative examinations: Secondary | ICD-10-CM

## 2021-06-02 DIAGNOSIS — F8081 Childhood onset fluency disorder: Secondary | ICD-10-CM

## 2021-06-02 DIAGNOSIS — M5382 Other specified dorsopathies, cervical region: Secondary | ICD-10-CM | POA: Insufficient documentation

## 2021-06-02 NOTE — Telephone Encounter (Signed)
Thank you for evaluating this patient for Botox, and I understand your clinical reasoning and reluctance.  ?The patient can follow up with PCP and me.  ?

## 2021-06-02 NOTE — Progress Notes (Signed)
? ? ? ?   ?Full Name: Mandy Durham Gender: Female ?MRN #: [id number] Date of Birth: [birth year] ?   ?Visit Date: 06/02/2021 13:45 ?Age: 76 Years ?Examining Physician: Arlice Colt, MD  ?Referring Physician: Larey Seat, MD ?   ?History: ?Mandy Durham is a 76 year old woman with neck weakness who has a history of traumatic brain injury and parkinsonism.  On exam, neck flexion was 4+/5 and neck extension was 5/5.  Trapezius strength was 4+/5, biceps were 5/5, triceps were 4/5, and deltoids were 4+/5. ? ?Nerve conduction studies: ?The left median, ulnar, peroneal and tibial motor responses had normal distal latencies, amplitudes and conduction velocities.  F-wave latencies were normal.  The left sural, superficial peroneal, median and ulnar sensory responses had normal peak latencies and amplitudes. ? ?Electromyography: ?Needle EMG of selected muscles of the left arm, left leg and axial muscles of the upper back and neck were performed.  In the left arm, there was mild chronic denervation in the left triceps,  extensor digitorum communis, supraspinatus and rhomboid muscles.  In the left leg, there was mild chronic denervation in the vastus medialis muscle and some increased polyphasic motor units with normal recruitment in the other leg muscles evaluated.  In the cranial nerve innervated muscles evaluated, there was mild chronic denervation in the bilateral trapezius, left sternocleidomastoid and mentalis muscles. ? ?Impression: ?This NCV/EMG study shows the following: ?No evidence of polyneuropathy or mononeuropathy ?Left chronic C7 +/- C6 radiculopathy. ?Mild chronic denervation was also noted in the trapezius, sternocleidomastoid and mentalis muscle.  However, there was no acute denervation.  The etiology is uncertain and could be related to her remote history of close head injury.  If symptoms worsen over the next few months, consider a reevaluation to determine if spontaneous activity has developed which would be  worrisome for a motor neuron disease ? ?Mandy Durham A. Felecia Shelling, MD, PhD, Mandy Durham ?Certified in Neurology, Clinical Neurophysiology, Sleep Medicine, Pain Medicine and Neuroimaging ?Director, [facility] ? ?[facility] ?[address], [address] ?[place], [place] [address] ?[phone] ? ? ?Verbal informed consent was obtained from the patient, patient was informed of potential risk of procedure, including bruising, bleeding, hematoma formation, infection, muscle weakness, muscle pain, numbness, among others. ?   ? ?   ?[place] ?   ?Nerve / Sites Muscle Latency Ref. Amplitude Ref. Rel Amp Segments Distance Velocity Ref. Area  ?  ms ms mV mV %  cm m/s m/s mVms  ?L Median - APB  ?   Wrist APB 3.2 ?4.4 6.4 ?4.0 100 Wrist - APB 7   24.0  ?   Upper arm APB 6.8  5.8  90.5 Upper arm - Wrist 20 56 ?49 23.3  ?L Ulnar - ADM  ?   Wrist ADM 2.8 ?3.3 9.7 ?6.0 100 Wrist - ADM 7   27.7  ?   B.Elbow ADM 5.7  8.3  85 B.Elbow - Wrist 17 58 ?49 25.6  ?   A.Elbow ADM 7.6  8.3  101 A.Elbow - B.Elbow 10 55 ?49 26.7  ?L Peroneal - EDB  ?   Ankle EDB 4.4 ?6.5 3.1 ?2.0 100 Ankle - EDB 9   10.5  ?   Fib head EDB 10.8  2.3  73.7 Fib head - Ankle 28 44 ?44 8.1  ?   Pop fossa EDB 13.1  2.1  92.7 Pop fossa - Fib head 10 44 ?44 7.7  ?  Pop fossa - Ankle      ?L Tibial - AH  ?   Ankle AH 4.2 ?5.8 8.5 ?4.0 100 Ankle - AH 9   23.4  ?   Pop fossa AH 13.5  6.8  80.6 Pop fossa - Ankle 41 44 ?41 22.1  ?           ?[place] ?   ?Nerve / Sites Rec. Site Peak Lat Ref.  Amp Ref. Segments Distance  ?  ms ms ?V ?V  cm  ?L Sural - Ankle (Calf)  ?   Calf Ankle 3.5 ?4.4 6 ?6 Calf - Ankle 14  ?L Superficial peroneal - Ankle  ?   Lat leg Ankle 4.2 ?4.4 6 ?6 Lat leg - Ankle 14  ?L Median - Orthodromic (Dig II, Mid palm)  ?   Dig II Wrist 3.0 ?3.4 10 ?10 Dig II - Wrist 13  ?L Ulnar - Orthodromic, (Dig V, Mid palm)  ?   Dig V Wrist 2.9 ?3.1 5 ?5 Dig V - Wrist 11  ?           ?F  Wave ?   ?Nerve F Lat Ref.  ? ms ms  ?L  Tibial - AH 51.6 ?56.0  ?L Ulnar - ADM 26.2 ?32.0  ?       ?EMG Summary Table   ? Spontaneous MUAP Recruitment  ?Muscle IA Fib PSW Fasc Other Amp Dur. Poly Pattern  ?L. Deltoid Normal None None None _______ Normal Normal Normal Normal  ?L. Triceps brachii Normal None None None _______ Increased Increased 1+ Reduced  ?L. Biceps brachii Normal None None None _______ Normal Normal Normal Normal  ?L. Extensor digitorum communis Normal None None None _______ Increased Increased 1+ Reduced  ?L. First dorsal interosseous Normal None None None _______ Normal Normal Normal Normal  ?L. Trapezius Normal None None None _______ Normal Increased 2+ Reduced  ?L. Supraspinatus Normal None None None _______ Increased Normal 1+ Reduced  ?L. Sternocleidomastoid Normal None None None _______ Increased Increased 1+ Reduced  ?L. Mentalis Normal None None None _______ Normal Increased 2+ Reduced  ?L. Teres major Normal None None None _______ Normal Increased 1+ Reduced  ?L. Vastus medialis Normal None None None _______ Increased Increased 1+ Reduced  ?L. Gastrocnemius (Medial head) Normal None None None _______ Normal Normal 1+ Normal  ?L. Tibialis anterior Normal None None None _______ Normal Normal 1+ Normal  ?L. Peroneus longus Normal None None None _______ Normal Normal 1+ Normal  ?L. Iliopsoas Normal None None None _______ Normal Normal 1+ Normal  ?R. Trapezius Normal None None None _______ Normal Normal 1+ Reduced  ? ?  ?

## 2021-06-02 NOTE — Telephone Encounter (Signed)
-----   Message from Larey Seat, MD sent at 05/29/2021  4:31 PM EDT ----- ?IMPRESSION: abnormal dopamine distribution and production-  ? ?Severe decreased radiotracer activity within the bilateral putamen ?and significant decreased activity in the heads of caudate nuclei. ? ?This pattern of reduced striatal activity has been associated with ?Parkinsonian syndrome pathology. ?

## 2021-06-02 NOTE — Telephone Encounter (Signed)
Called and spoke with pt about results. She placed me on speaker phone so that husband could listen as well. She has EMG/NCS scheduled for this afternoon. Aware we will reach out about results once back. ?She requested to make appt to discuss results with MD. Scheduled first available for 09/03/21 at 3:30p (she requested afternoon appt). Aware we will call if anything sooner becomes available.  ?

## 2021-06-02 NOTE — Telephone Encounter (Signed)
?  Care Management  ? ?Follow Up Note ? ? ?06/02/2021 ? ?Name: Mandy Durham MRN: [id number] DOB: June 16, 1945 ? ?Referred By: Colon Branch, MD ? ?Reason for Referral:  Chronic Care Management Needs in Patient with Hypertension, Vascular Parkinsonism, Traumatic Brain Injury, Subarachnoid Hemorrhage, Skull Fracture, Dizziness and Giddiness, Anxiety, and Chronic Fatigue. ? ?An unsuccessful telephone outreach was attempted today. The patient was referred to the case management team for assistance with care management and care coordination. LCSW left a HIPAA compliant message on voicemail, providing contact information, encouraging patient to return LCSW's call at her earliest convenience.  LCSW will make a second follow-up telephone outreach call attempt within the next 7-10 business days, if a return call is not received from patient in the meantime. ? ?Follow-Up Plan:  Request placed with Scheduling Care Guide to reschedule patient's follow-up telephone outreach call with LCSW. ? ?Nat Christen LCSW ?Licensed Clinical Social Worker ?[place] ?[phone]  ? ?Nat Christen LCSW ?Licensed Clinical Social Worker ?[place] ?[phone]  ?

## 2021-06-02 NOTE — Progress Notes (Signed)
?  Subjective:  ?Patient ID: Mandy Durham, female    DOB: May 10, 1945,  MRN: [id number] ? ?Mandy Durham presents to clinic today for painful elongated mycotic toenails 1-5 bilaterally which are tender when wearing enclosed shoe gear. Pain is relieved with periodic professional debridement. ? ?New problem(s): None.  ? ?PCP is Colon Branch, MD , and last visit was May 26, 2021. ? ?No Known Allergies ? ?Review of Systems: Negative except as noted in the HPI. ? ?Objective: ? ?General: Patient is a pleasant 76 y.o. Caucasian female WD, WN in NAD. AAO x 3.  ? ?Neurovascular Examination: ?CFT immediate b/l LE. Palpable DP/PT pulses b/l LE. Digital hair present b/l. Skin temperature gradient WNL b/l. No pain with calf compression b/l. No edema noted b/l. No cyanosis or clubbing noted b/l LE. ? ?Protective sensation intact 5/5 intact bilaterally with 10g monofilament b/l. Vibratory sensation intact b/l. Proprioception intact bilaterally. ? ?Dermatological:  ?No open wounds b/l LE. Dry skin noted b/l LE. No interdigital macerations noted b/l LE. Toenails 1-5 b/l elongated, discolored, dystrophic, thickened, crumbly with subungual debris and tenderness to dorsal palpation.Hyperkeratotic lesion(s) L hallux, R 5th toe, submet head 5 right foot, and 1st metatarsal head left foot with tenderness to palpation. No edema, no erythema, no drainage, no fluctuance.  ? ?Musculoskeletal:  ?Normal muscle strength 5/5 to all lower extremity muscle groups bilaterally. HAV with bunion deformity noted b/l LE. Hammertoe deformity noted 2-5 b/l. Patient ambulates independently without assistance. ? ?Hemoglobin A1C Latest Ref Rng & Units 05/26/2021 01/22/2021 06/07/2020  ?HGBA1C 4.6 - 6.5 % 6.4 6.6(H) 6.2(H)  ?Some recent data might be hidden  ? ?Assessment/Plan: ?1. Pain due to onychomycosis of toenails of both feet   ?2. Corns and callosities   ?3. Pain in both feet   ?4. Xerosis cutis   ?-Examined patient. ?-Toenails 1-5 b/l were debrided in  length and girth with sterile nail nippers and dremel without iatrogenic bleeding.  ?-Corn(s) R 5th toe and callus(es) L hallux, submet head 5 right foot, and 1st metatarsal head left foot were pared utilizing sterile scalpel blade without incident. Total number debrided =4. ?-For dry skin, recommended daily use of Bag Balm Hand and Body Moistuizer which may be purchased at local drug store or on [employer]. ?-Patient/POA to call should there be question/concern in the interim.  ? ?Return in about 3 months (around 08/27/2021). ? ?Marzetta Board, DPM  ?

## 2021-06-02 NOTE — Telephone Encounter (Signed)
I saw patient to evaluate if she is a good candidate for Botox injection for her neck pain ? ?Patient has chronic neck pain, worsening over the past 6 months, tendency for neck flexion forward ? ?Tenderness of bilateral upper trapezius muscles, mild limited range of motion of neck, ? ?Almost constant bilateral upper and lower extremity tremor, gait abnormality ? ?She has mild to moderate anterocollis, not a good candidate for Botox injection due to the deep lying anterior cervical muscles, potential high risk for side effect ? ?She is in the process of receiving physical therapy, I emphasized importance of warm compression, neck stretching exercise, recommended thermacare patch for her neck pain ?

## 2021-06-03 ENCOUNTER — Telehealth: Payer: PPO

## 2021-06-03 ENCOUNTER — Ambulatory Visit: Payer: PPO | Admitting: Physical Therapy

## 2021-06-03 ENCOUNTER — Encounter: Payer: Self-pay | Admitting: Physical Therapy

## 2021-06-03 ENCOUNTER — Other Ambulatory Visit: Payer: Self-pay

## 2021-06-03 DIAGNOSIS — M6281 Muscle weakness (generalized): Secondary | ICD-10-CM

## 2021-06-03 DIAGNOSIS — R293 Abnormal posture: Secondary | ICD-10-CM

## 2021-06-03 DIAGNOSIS — M542 Cervicalgia: Secondary | ICD-10-CM

## 2021-06-03 DIAGNOSIS — R2681 Unsteadiness on feet: Secondary | ICD-10-CM

## 2021-06-03 NOTE — Telephone Encounter (Signed)
Called and spoke w/ pt. Offered sooner appt 06/09/21 at 2p with Dr. Brett Fairy. She accepted. Appt previously made for [facility] since she was r/s for sooner appt. ?

## 2021-06-03 NOTE — Therapy (Addendum)
?OUTPATIENT PHYSICAL THERAPY TREATMENT NOTE ? ? ?Patient Name: Mandy Durham ?MRN: [id number] ?DOB:09/25/1945, 76 y.o., female ?Today's Date: 06/03/2021 ? ?PCP: Colon Branch, MD ?REFERRING PROVIDER: Ward Givens, NP ? ? ? 06/03/21 1407  ?PT Visits / Re-Eval  ?Visit Number 16  ?Number of Visits 31  ?Date for PT Re-Evaluation 08/29/21  ?Authorization  ?Authorization Type [employer]  ?PT Time Calculation  ?PT Start Time 1406 ?(pt late)  ?PT Stop Time 7897  ?PT Time Calculation (min) 39 min  ?PT - End of Session  ?Activity Tolerance Patient tolerated treatment well  ?Behavior During Therapy [facility] for tasks assessed/performed  ? ? ? ? ?Past Medical History:  ?Diagnosis Date  ? Anxiety state, unspecified   ? Hyperlipidemia   ? Hypertension   ? Insomnia   ? LBBB (left bundle branch block)   ? LHC in 2002 showed normal coronaries.   ? Osteoarthrosis, unspecified whether generalized or localized, unspecified site   ? Overweight(278.02)   ? Parkinson disease ([place])   ? S/P placement of cardiac pacemaker   ? a. 02/21/16: Medtronic Advisa DR MRI SureScan model A2DR01 (serial number [id number])   ? Serrated adenoma of colon 2007  ? Symptomatic menopausal or female climacteric states   ? Syncope and collapse   ? 11/12: Holter 12/12 with rare PACS, HR range 64-140, average 82, no significant arrhythmias. Echo (1/13): EF 50-55%, mild LVH, septal-lateral dyssynchrony c/w LBBB. 3-week event monitor (1/13): No significant arrhythmia.   ? ?Past Surgical History:  ?Procedure Laterality Date  ? COLONOSCOPY  2007  ? EP IMPLANTABLE DEVICE N/A 02/21/2016  ? Procedure: Pacemaker Implant;  Surgeon: Will Meredith Leeds, MD;  Location: [facility];  Service: Cardiovascular;  Laterality: N/A;  ? POLYPECTOMY    ? ?Patient Active Problem List  ? Diagnosis Date Noted  ? Neck muscle weakness 06/02/2021  ? Diastolic CHF ([place]) 84/78/4128  ? Kyphosis due to degeneration of spine 05/05/2021  ? Drug-induced orofacial dyskinesia 05/05/2021   ? Hyperkinesis 05/05/2021  ? Traumatic intracerebral hemorrhage with loss of consciousness of 31 minutes to 59 minutes (HCC) 05/05/2021  ? Stammering and stuttering 05/05/2021  ? 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 Functional dyspepsia 09/08/2017  ? Slow transit constipation 09/08/2017  ? S/P placement of cardiac pacemaker   ? Mobitz II 02/20/2016  ? Nystagmus, end-position 02/12/2016  ? Dizziness and giddiness 02/12/2016  ? Flapping tremor 02/12/2016  ? Chronic fatigue 10/28/2015  ? PCP NOTES >>>> 02/13/2015  ? TBI (traumatic brain injury) 01/28/2015  ? Essential hypertension   ? Acute post-traumatic headache, not intractable   ? Vertigo due to brain injury   ? SAH (subarachnoid hemorrhage) ([place]) 01/21/2015  ? Annual physical exam 09/07/2014  ? Orthostatic hypotension 08/30/2014  ? Acromioclavicular arthrosis 03/21/2014  ? Allergic rhinitis 02/27/2014  ? SI (sacroiliac) joint dysfunction 02/02/2014  ? Gastrocnemius strain, left 12/11/2013  ? Back pain 08/22/2013  ? LBBB (left bundle branch block) 05/13/2013  ? Shingles outbreak 12/15/2012  ? Borderline diabetes   ? Insomnia 01/12/2008  ? HLD (hyperlipidemia) 02/04/2007  ? Anxiety 10/12/2006  ? Osteoarthritis 10/12/2006  ? ? ?REFERRING DIAG: PD (however per recent neurologist's appt, pt does not have PD), neck pain  ? ?THERAPY DIAG:  ?Abnormal posture ? ?Muscle weakness (generalized) ? ?Cervicalgia ? ?Unsteadiness on feet ? ?PERTINENT HISTORY: Parkinsonism, anxiety, HLD, HTN, pacemaker, hx of TBI (2016) ? ?PRECAUTIONS: Fall  ? ?SUBJECTIVE: Felt okay when she woke up this morning, but then  neck was more bothersome 1-2 hours after waking up. Had to take some ibuprofen and lay down for a little bit. Had NCV/EMG testing on Monday at the neurologist. Saw Dr. Krista Blue afterwards and was advised against Botox. Has an appt with Dr. Brett Fairy next Monday to go over the findings of the NVC/EMG and Dat scan results (pt has not gotten these yet).   ? ? ? ?PAIN:  ?Are you  having pain? Yes ?NPRS scale: "just a little pain" ?Pain location: back of the neck  ?Pain orientation: Posterior  ?PAIN TYPE: tightness and sore ? ?Aggravating factors: nothing ?Relieving factors: ibuprofen  ? ? ? ? ?TODAY'S TREATMENT:  ? ? [facility] PT Assessment - 06/03/21 1415   ? ?  ? Mini-BESTest  ? Sit To Stand Normal: Comes to stand without use of hands and stabilizes independently.   ? Rise to Toes Moderate: Heels up, but not full range (smaller than when holding hands), OR noticeable instability for 3 s.   ? Stand on one leg (left) Severe: Unable   ? Stand on one leg (right) Moderate: < 20 s   6, 1.8  ? Stand on one leg - lowest score 0   ? Compensatory Stepping Correction - Forward Normal: Recovers independently with a single, large step (second realignement is allowed).   ? Compensatory Stepping Correction - Backward Moderate: More than one step is required to recover equilibrium   2 steps  ? Compensatory Stepping Correction - Left Lateral Moderate: Several steps to recover equilibrium   2 steps  ? Compensatory Stepping Correction - Right Lateral Severe:  Falls, or cannot step   unable to step  ? Stepping Corredtion Lateral - lowest score 0   ? Stance - Feet together, eyes open, firm surface  Normal: 30s   ? Stance - Feet together, eyes closed, foam surface  Severe: Unable   ? Incline - Eyes Closed Moderate: Stands independently < 30s OR aligns with surface   ? Change in Gait Speed Normal: Significantly changes walkling speed without imbalance   ? Walk with head turns - Horizontal Moderate: performs head turns with reduction in gait speed.   ? Walk with pivot turns Moderate:Turns with feet close SLOW (>4 steps) with good balance.   ? Step over obstacles Moderate: Steps over box but touches box OR displays cautious behavior by slowing gait.   ? Timed UP & GO with Dual Task Moderate: Dual Task affects either counting OR walking (>10%) when compared to the TUG without Dual Task.   ? Mini-BEST total score 15   ?   ? Timed Up and Go Test  ? Normal TUG (seconds) 10.5   ? Cognitive TUG (seconds) 15.2   counts backwards by 1, unable to do 3  ? ?  ?  ? ?  ? ? ?Pt performs PWR! Moves in sitting position  ?  ?PWR! Up for improved posture x10 reps  cues for technique, scap retraction, elbow extension and opening up hands - 5-8 second holds in position, then performed an additional x5 reps with use of yellow tband (tactile/verbal cues for sticking chest out and upright posture vs. Leaning posterior). Cues to look upright for neck extension throughout. Pt fatigues easily esp with use of band and needing rest break between each set.  ? ?PWR! Rock for improved weight shifting x8 reps each side, cues to come down onto thigh with forearm and look up at hand and hold for 5 seconds. Incr difficulty with turning  head up and to the R.  ? ?Will provide handout for these 2 exercises at next session (copier not working today).  ? ? ? ? ?PATIENT EDUCATION: ?Education details: Results of miniBEST and areas to work on pt's balance with therapy, went over with pt why she would not be a good candidate for Botox (pt was asking) based on Dr. Rhea Belton evaluation.  ?Person educated: Patient ?Education method: Explanation ?Education comprehension: verbalized understanding ? ? ?HOME EXERCISE PROGRAM: ?[email or web address] ? ? ?  ? ?SHORT TERM GOALS: (updated for re-cert) ALL STGS DUE 8/67/54 ? ?Pt will finish further assessment of miniBEST with LTG written. ?Baseline: 15/28 ?Target date: 07/01/2021 ?Goal status: MET ? ?2.  Pt will consistently report pain in neck as 2/10 or less in order to demo decr pain for functional mobility/gait. ?Baseline: 4/10 ?Target date: 07/01/2021 ?Goal status: INITIAL ? ?3.  Pt will recover posterior balance in push and release test in 1 robust step independently, for improved balance recovery  ?Baseline: 2 steps ?Target date: 07/01/2021 ?Goal status: INITIAL ? ?4.  Pt will improve cervical extension AROM to at least 15 deg in order to demo  improved AROM for functional mobility.  ?Baseline: 12 deg  ?Target date: 07/01/2021 ?Goal status: INITIAL ? ? ? ? ? ?   ? ?LONG TERM GOALS:(updated for re-cert) ALL LTGS DUE 4/92/01 ? ?Pt will be independent in

## 2021-06-06 ENCOUNTER — Other Ambulatory Visit: Payer: Self-pay

## 2021-06-06 ENCOUNTER — Ambulatory Visit: Payer: PPO | Admitting: Physical Therapy

## 2021-06-06 DIAGNOSIS — R2689 Other abnormalities of gait and mobility: Secondary | ICD-10-CM

## 2021-06-06 DIAGNOSIS — R2681 Unsteadiness on feet: Secondary | ICD-10-CM

## 2021-06-06 DIAGNOSIS — M6281 Muscle weakness (generalized): Secondary | ICD-10-CM

## 2021-06-06 DIAGNOSIS — R293 Abnormal posture: Secondary | ICD-10-CM

## 2021-06-06 DIAGNOSIS — M542 Cervicalgia: Secondary | ICD-10-CM

## 2021-06-06 NOTE — Therapy (Deleted)
?OUTPATIENT PHYSICAL THERAPY TREATMENT NOTE ? ? ?Patient Name: Mandy Durham ?MRN: [id number] ?DOB:03-Dec-1945, 76 y.o., female ?Today's Date: 06/06/2021 ? ?PCP: Colon Branch, MD ?REFERRING PROVIDER: Ward Givens, NP ? ? ? 06/06/21 1322  ?PT Visits / Re-Eval  ?Visit Number 17  ?Number of Visits 31  ?Date for PT Re-Evaluation 08/29/21  ?Authorization  ?Authorization Type [employer]  ?PT Time Calculation  ?PT Start Time 1319  ?PT - End of Session  ?Activity Tolerance Patient tolerated treatment well  ?Behavior During Therapy [facility] for tasks assessed/performed  ? ?Past Medical History:  ?Diagnosis Date  ? Anxiety state, unspecified   ? Hyperlipidemia   ? Hypertension   ? Insomnia   ? LBBB (left bundle branch block)   ? LHC in 2002 showed normal coronaries.   ? Osteoarthrosis, unspecified whether generalized or localized, unspecified site   ? Overweight(278.02)   ? Parkinson disease ([place])   ? S/P placement of cardiac pacemaker   ? a. 02/21/16: Medtronic Advisa DR MRI SureScan model A2DR01 (serial number [id number])   ? Serrated adenoma of colon 2007  ? Symptomatic menopausal or female climacteric states   ? Syncope and collapse   ? 11/12: Holter 12/12 with rare PACS, HR range 64-140, average 82, no significant arrhythmias. Echo (1/13): EF 50-55%, mild LVH, septal-lateral dyssynchrony c/w LBBB. 3-week event monitor (1/13): No significant arrhythmia.   ? ?Past Surgical History:  ?Procedure Laterality Date  ? COLONOSCOPY  2007  ? EP IMPLANTABLE DEVICE N/A 02/21/2016  ? Procedure: Pacemaker Implant;  Surgeon: Will Meredith Leeds, MD;  Location: [facility];  Service: Cardiovascular;  Laterality: N/A;  ? POLYPECTOMY    ? ?Patient Active Problem List  ? Diagnosis Date Noted  ? Neck muscle weakness 06/02/2021  ? Diastolic CHF ([place]) 38/75/6433  ? Kyphosis due to degeneration of spine 05/05/2021  ? Drug-induced orofacial dyskinesia 05/05/2021  ? Hyperkinesis 05/05/2021  ? Traumatic intracerebral hemorrhage with  loss of consciousness of 31 minutes to 59 minutes (HCC) 05/05/2021  ? Stammering and stuttering 05/05/2021  ? 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 Functional dyspepsia 09/08/2017  ? Slow transit constipation 09/08/2017  ? S/P placement of cardiac pacemaker   ? Mobitz II 02/20/2016  ? Nystagmus, end-position 02/12/2016  ? Dizziness and giddiness 02/12/2016  ? Flapping tremor 02/12/2016  ? Chronic fatigue 10/28/2015  ? PCP NOTES >>>> 02/13/2015  ? TBI (traumatic brain injury) 01/28/2015  ? Essential hypertension   ? Acute post-traumatic headache, not intractable   ? Vertigo due to brain injury   ? SAH (subarachnoid hemorrhage) ([place]) 01/21/2015  ? Annual physical exam 09/07/2014  ? Orthostatic hypotension 08/30/2014  ? Acromioclavicular arthrosis 03/21/2014  ? Allergic rhinitis 02/27/2014  ? SI (sacroiliac) joint dysfunction 02/02/2014  ? Gastrocnemius strain, left 12/11/2013  ? Back pain 08/22/2013  ? LBBB (left bundle branch block) 05/13/2013  ? Shingles outbreak 12/15/2012  ? Borderline diabetes   ? Insomnia 01/12/2008  ? HLD (hyperlipidemia) 02/04/2007  ? Anxiety 10/12/2006  ? Osteoarthritis 10/12/2006  ? ? ?REFERRING DIAG: PD, neck pain  ? ?THERAPY DIAG:  ?Abnormal posture ? ?Muscle weakness (generalized) ? ?Cervicalgia ? ?Unsteadiness on feet ? ?Other abnormalities of gait and mobility ? ?PERTINENT HISTORY: Parkinsonism, anxiety, HLD, HTN, pacemaker, hx of TBI (2016) ? ?PRECAUTIONS: Fall  ? ?SUBJECTIVE: Pt reports the exercises take her over an hour to complete each day.  Would like to narrow them down.  Pt asking if Dr. Windy Carina note had been faxed  over to this office, "it's the exercise he wants me to do." ? ?PAIN:  ?Are you having pain? Yes ?NPRS scale: "just a little pain" ?Pain location: back of the neck  ?Pain orientation: Posterior  ?PAIN TYPE: tightness and sore ? ?Aggravating factors: nothing ?Relieving factors: ibuprofen  ? ? ?TODAY'S TREATMENT:   ?   ?  06/05/2021: ?Reviewed handout and seated  postural exercises from previous session.  Performed 10 reps of previously reviewed seated PWR! Up and PWR! Rock.  Pt required significant cues to sequence and perform rocking/weight shifting exercse. ?   ?Reviewed current MedBridge exercises and condensed to allow pt to perform more regularly and effectively. ?  Access Code: [email or web address] ?URL: [email or web address] ?Date: 06/06/2021 ?Prepared by: Misty Stanley ? ?Exercises ?- Sidelying Neck Sidebending  - 1 x daily - 7 x weekly - 2 sets - 10 reps ?- Supine Bridge  - 1 x daily - 7 x weekly - 1 sets - 10 reps ?- Isometric Cervical Extension at Wall with Ball  - 1 x daily - 7 x weekly - 2 sets - 5 reps - 10 second hold ?- Scapular Retraction with Resistance  - 1 x daily - 7 x weekly - 2 sets - 5 reps ?- Seated Shoulder Horizontal Abduction with Resistance - Palms Down  - 1 x daily - 7 x weekly - 2 sets - 5 reps ?- Sternocleidomastoid Stretch  - 1 x daily - 7 x weekly - 2 sets - 20 hold ? ?  06/03/2021: ?Pt performs PWR! Moves in sitting position  ?  ?PWR! Up for improved posture x10 reps  cues for technique, scap retraction, elbow extension and opening up hands - 5-8 second holds in position, then performed an additional x5 reps with use of yellow tband (tactile/verbal cues for sticking chest out and upright posture vs. Leaning posterior). Cues to look upright for neck extension throughout. Pt fatigues easily esp with use of band and needing rest break between each set.  ? ?PWR! Rock for improved weight shifting x8 reps each side, cues to come down onto thigh with forearm and look up at hand and hold for 5 seconds. Incr difficulty with turning head up and to the R.  ? ?Will provide handout for these 2 exercises at next session (copier not working today).  ? ? ? ? ?PATIENT EDUCATION: ?Education details:   ?Person educated: Patient ?Education method: Explanation ?Education comprehension: verbalized understanding ? ? ?HOME EXERCISE PROGRAM: ?[email or web address] ? ? ?   ? ?SHORT TERM GOALS: (updated for re-cert) ALL STGS DUE 7/40/81 ? ?Pt will finish further assessment of miniBEST with LTG written. ?Baseline: 15/28 ?Target date: 07/04/2021 ?Goal status: MET ? ?2.  Pt will consistently report pain in neck as 2/10 or less in order to demo decr pain for functional mobility/gait. ?Baseline: 4/10 ?Target date: 07/04/2021 ?Goal status: INITIAL ? ?3.  Pt will recover posterior balance in push and release test in 1 robust step independently, for improved balance recovery  ?Baseline: 2 steps ?Target date: 07/04/2021 ?Goal status: INITIAL ? ?4.  Pt will improve cervical extension AROM to at least 15 deg in order to demo improved AROM for functional mobility.  ?Baseline: 12 deg  ?Target date: 07/04/2021 ?Goal status: INITIAL ? ? ? ? ? ?   ? ?LONG TERM GOALS:(updated for re-cert) ALL LTGS DUE 4/48/18 ? ?Pt will be independent in final HEP with husband supervision in order to build upon functional gains made in therapy. ?Baseline: Pt's husband has  not come back into session, therapist needing to review exercises with pt occasionally  ?Target date: 08/01/2021 ?Goal status: INITIAL ? ?2.  Pt will improve miniBEST to at least a 19/28 in order to demo decr fall risk. ? ?Baseline: 15/28 ?Target date: 08/01/2021 ?Goal status: REVISED ? ?3.  Pt will improve cervical AROM of R rotation to 45 degrees and L rotation to 45 degrees in order to demo improved functional mobility.  ?Baseline: R: 40 deg, L 40 deg on 06/02/21 ?Target date: 07/04/2021 ?Goal status: INITIAL ? ?4.  Pt will ambulate at least 200' over indoor and outdoor unlevel surfaces with supervision with no AD vs. LRAD in order to demo improved community mobility.  ? ?Target date: 08/01/2021 ?Goal status: ONGOING ? ?5.  Pt will recover lateral balance in push and release test in 1 robust step independently, for improved balance recovery ?Baseline:  R lateral = unable to ellicit a step, L lateral =  2 steps ?Target date: 08/01/2021 ?Goal status:  INITIAL ? ? ?   ?Plan  ?Clinical Impression Statement   ?Personal Factors and Comorbidities Comorbidity 3+;Past/Current Experience;Time since onset of injury/illness/exacerbation  ?Comorbidities PMH: Parkinsonism, anxiety,

## 2021-06-06 NOTE — Therapy (Addendum)
?OUTPATIENT PHYSICAL THERAPY TREATMENT NOTE ? ? ?Patient Name: Mandy Durham ?MRN: [id number] ?DOB:[birth year], 76 y.o., female ?Today's Date: 06/06/2021 ? ?PCP: Colon Branch, MD ?REFERRING PROVIDER: Ward Givens, NP ? ? PT End of Session - 06/06/21 1322   ? ? Visit Number 17   ? Number of Visits 31   ? Date for PT Re-Evaluation 08/29/21   ? Authorization Type [employer]   ? PT Start Time 1319   ? PT Stop Time 1400   ? PT Time Calculation (min) 41 min   ? Activity Tolerance Patient tolerated treatment well   ? Behavior During Therapy [facility] for tasks assessed/performed   ? ?  ?  ? ?  ? ? ?Past Medical History:  ?Diagnosis Date  ? Anxiety state, unspecified   ? Hyperlipidemia   ? Hypertension   ? Insomnia   ? LBBB (left bundle branch block)   ? LHC in 2002 showed normal coronaries.   ? Osteoarthrosis, unspecified whether generalized or localized, unspecified site   ? Overweight(278.02)   ? Parkinson disease ([place])   ? S/P placement of cardiac pacemaker   ? a. 02/21/16: Medtronic Advisa DR MRI SureScan model A2DR01 (serial number [id number])   ? Serrated adenoma of colon 2007  ? Symptomatic menopausal or female climacteric states   ? Syncope and collapse   ? 11/12: Holter 12/12 with rare PACS, HR range 64-140, average 82, no significant arrhythmias. Echo (1/13): EF 50-55%, mild LVH, septal-lateral dyssynchrony c/w LBBB. 3-week event monitor (1/13): No significant arrhythmia.   ? ?Past Surgical History:  ?Procedure Laterality Date  ? COLONOSCOPY  2007  ? EP IMPLANTABLE DEVICE N/A 02/21/2016  ? Procedure: Pacemaker Implant;  Surgeon: Will Meredith Leeds, MD;  Location: [facility];  Service: Cardiovascular;  Laterality: N/A;  ? POLYPECTOMY    ? ?Patient Active Problem List  ? Diagnosis Date Noted  ? Neck muscle weakness 06/02/2021  ? Diastolic CHF ([place]) 46/56/8127  ? Kyphosis due to degeneration of spine 05/05/2021  ? Drug-induced orofacial dyskinesia 05/05/2021  ? Hyperkinesis 05/05/2021  ? Traumatic  intracerebral hemorrhage with loss of consciousness of 31 minutes to 59 minutes (HCC) 05/05/2021  ? Stammering and stuttering 05/05/2021  ? 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 Functional dyspepsia 09/08/2017  ? Slow transit constipation 09/08/2017  ? S/P placement of cardiac pacemaker   ? Mobitz II 02/20/2016  ? Nystagmus, end-position 02/12/2016  ? Dizziness and giddiness 02/12/2016  ? Flapping tremor 02/12/2016  ? Chronic fatigue 10/28/2015  ? PCP NOTES >>>> 02/13/2015  ? TBI (traumatic brain injury) 01/28/2015  ? Essential hypertension   ? Acute post-traumatic headache, not intractable   ? Vertigo due to brain injury   ? SAH (subarachnoid hemorrhage) ([place]) 01/21/2015  ? Annual physical exam 09/07/2014  ? Orthostatic hypotension 08/30/2014  ? Acromioclavicular arthrosis 03/21/2014  ? Allergic rhinitis 02/27/2014  ? SI (sacroiliac) joint dysfunction 02/02/2014  ? Gastrocnemius strain, left 12/11/2013  ? Back pain 08/22/2013  ? LBBB (left bundle branch block) 05/13/2013  ? Shingles outbreak 12/15/2012  ? Borderline diabetes   ? Insomnia 01/12/2008  ? HLD (hyperlipidemia) 02/04/2007  ? Anxiety 10/12/2006  ? Osteoarthritis 10/12/2006  ? ? ?REFERRING DIAG: PD, neck pain ? ?THERAPY DIAG:  ?Abnormal posture ? ?Muscle weakness (generalized) ? ?Cervicalgia ? ?Unsteadiness on feet ? ?Other abnormalities of gait and mobility ? ?PERTINENT HISTORY: Parkinsonism, anxiety, HLD, HTN, pacemaker, hx of TBI (2016) ? ?PRECAUTIONS: Fall  ? ?SUBJECTIVE: Pt reports the  exercises take her over an hour to complete each day.  Would like to narrow them down.  Pt asking if Dr. Windy Carina note had been faxed over to this office, "it's the exercise he wants me to do." ? ?PAIN:  ?Are you having pain? Yes ?NPRS scale: "just a little pain" ?Pain location: back of the neck  ?Pain orientation: Posterior  ?PAIN TYPE: tightness and sore ?Aggravating factors: nothing ?Relieving factors: ibuprofen  ? ? ?TODAY'S TREATMENT:    ?   ?  06/05/2021: ?Reviewed handout and seated postural exercises from previous session.  Performed 10 reps of previously reviewed seated PWR! Up and PWR! Rock.  Pt required significant cues to sequence and perform rocking/weight shifting exercse. ?   ?Reviewed current MedBridge exercises and condensed to allow pt to perform more regularly and effectively.  Reviewed the following exercises and provided verbal cues for correct technique.  Did not review theraband resistance exercises today.  Changed UT stretch to SCM stretch.  Needed constant cues to maintain neck extension activation. ?  Access Code: [email or web address] ?URL: [email or web address] ?Date: 06/06/2021 ?Prepared by: Misty Stanley ? ?Exercises ?- Sidelying Neck Sidebending  - 1 x daily - 7 x weekly - 2 sets - 10 reps ?- Supine Bridge  - 1 x daily - 7 x weekly - 1 sets - 10 reps ?- Isometric Cervical Extension at Wall with Ball  - 1 x daily - 7 x weekly - 2 sets - 5 reps - 10 second hold ?- Scapular Retraction with Resistance  - 1 x daily - 7 x weekly - 2 sets - 5 reps ?- Seated Shoulder Horizontal Abduction with Resistance - Palms Down  - 1 x daily - 7 x weekly - 2 sets - 5 reps ?- Sternocleidomastoid Stretch  - 1 x daily - 7 x weekly - 2 sets - 20 hold ? ?  06/03/2021: ?Pt performs PWR! Moves in sitting position  ?  ?PWR! Up for improved posture x10 reps  cues for technique, scap retraction, elbow extension and opening up hands - 5-8 second holds in position, then performed an additional x5 reps with use of yellow tband (tactile/verbal cues for sticking chest out and upright posture vs. Leaning posterior). Cues to look upright for neck extension throughout. Pt fatigues easily esp with use of band and needing rest break between each set.  ? ?PWR! Rock for improved weight shifting x8 reps each side, cues to come down onto thigh with forearm and look up at hand and hold for 5 seconds. Incr difficulty with turning head up and to the R.  ? ?Will provide  handout for these 2 exercises at next session (copier not working today).  ? ? ?PATIENT EDUCATION: ?Education details: Condensed and reviewed HEP ?Person educated: Patient ?Education method: Explanation ?Education comprehension: verbalized understanding ? ? ?HOME EXERCISE PROGRAM: ?Access Code: [email or web address] ?URL: [email or web address] ?Date: 06/06/2021 ?Prepared by: Misty Stanley ? ?Exercises ?- Sidelying Neck Sidebending  - 1 x daily - 7 x weekly - 2 sets - 10 reps ?- Supine Bridge  - 1 x daily - 7 x weekly - 1 sets - 10 reps ?- Isometric Cervical Extension at Wall with Ball  - 1 x daily - 7 x weekly - 2 sets - 5 reps - 10 second hold ?- Scapular Retraction with Resistance  - 1 x daily - 7 x weekly - 2 sets - 5 reps ?- Seated Shoulder Horizontal Abduction with Resistance - Palms Down  - 1 x daily -  7 x weekly - 2 sets - 5 reps ?- Sternocleidomastoid Stretch  - 1 x daily - 7 x weekly - 2 sets - 20 hold ? ?And ? ?  ? ? ?  ? ?SHORT TERM GOALS: (updated for re-cert) ALL STGS DUE 6/89/34 ? ?Pt will finish further assessment of miniBEST with LTG written. ?Baseline: 15/28 ?Target date: 07/04/2021 ?Goal status: MET ? ?2.  Pt will consistently report pain in neck as 2/10 or less in order to demo decr pain for functional mobility/gait. ?Baseline: 4/10 ?Target date: 07/04/2021 ?Goal status: INITIAL ? ?3.  Pt will recover posterior balance in push and release test in 1 robust step independently, for improved balance recovery  ?Baseline: 2 steps ?Target date: 07/04/2021 ?Goal status: INITIAL ? ?4.  Pt will improve cervical extension AROM to at least 15 deg in order to demo improved AROM for functional mobility.  ?Baseline: 12 deg  ?Target date: 07/04/2021 ?Goal status: INITIAL ? ?   ? ?LONG TERM GOALS:(updated for re-cert) ALL LTGS DUE 0/68/40 ? ?Pt will be independent in final HEP with husband supervision in order to build upon functional gains made in therapy. ?Baseline: Pt's husband has not come back into session,  therapist needing to review exercises with pt occasionally  ?Target date: 08/01/2021 ?Goal status: INITIAL ? ?2.  Pt will improve miniBEST to at least a 19/28 in order to demo decr fall risk. ? ?Baseline: 15/28 ?Target date: 08/01/2021 ?Goal

## 2021-06-09 ENCOUNTER — Encounter: Payer: Self-pay | Admitting: Neurology

## 2021-06-09 ENCOUNTER — Ambulatory Visit: Payer: PPO | Admitting: Neurology

## 2021-06-09 VITALS — BP 116/62 | HR 80 | Ht 65.0 in | Wt 109.5 lb

## 2021-06-09 DIAGNOSIS — R9089 Other abnormal findings on diagnostic imaging of central nervous system: Secondary | ICD-10-CM | POA: Insufficient documentation

## 2021-06-09 DIAGNOSIS — G5793 Unspecified mononeuropathy of bilateral lower limbs: Secondary | ICD-10-CM | POA: Insufficient documentation

## 2021-06-09 DIAGNOSIS — R29898 Other symptoms and signs involving the musculoskeletal system: Secondary | ICD-10-CM

## 2021-06-09 MED ORDER — ROPINIROLE HCL 0.5 MG PO TABS: 0.5000 mg | ORAL_TABLET | Freq: Two times a day (BID) | ORAL | 5 refills | Status: DC

## 2021-06-09 NOTE — Progress Notes (Signed)
? ?[facility] ? ? ?Provider:  Larey Durham, M D  ?Referring Provider: Colon Branch, MD ?Primary Care Physician:  Mandy Branch, MD ? ? ?The patient was last seen by MM and is now following up with me, Mandy Durham, 06-09-2021.  She is more nervous, has dysphonia, and sometimes stammers.  ? ? ? ?She underwent EMG and NCV with Mandy Durham last week, has been diagnosed with left cervical denervation and resulting left shoulder droop.  ? ?Examining Physician:          Mandy Colt, MD  ?Referring Physician: Larey Seat, MD ?   ?History: ?Mandy Durham is a 76 year old woman with neck weakness who has a history of traumatic brain injury and parkinsonism.  On exam, neck flexion was 4+/5 and neck extension was 5/5.  Trapezius strength was 4+/5, biceps were 5/5, triceps were 4/5, and deltoids were 4+/5. ?  ?Nerve conduction studies: ?The left median, ulnar, peroneal and tibial motor responses had normal distal latencies, amplitudes and conduction velocities.  F-wave latencies were normal.  The left sural, superficial peroneal, median and ulnar sensory responses had normal peak latencies and amplitudes. ?  ?Electromyography: ?Needle EMG of selected muscles of the left arm, left leg and axial muscles of the upper back and neck were performed.  In the left arm, there was mild chronic denervation in the left triceps,  extensor digitorum communis, supraspinatus and rhomboid muscles.  In the left leg, there was mild chronic denervation in the vastus medialis muscle and some increased polyphasic motor units with normal recruitment in the other leg muscles evaluated.  In the cranial nerve innervated muscles evaluated, there was mild chronic denervation in the bilateral trapezius, left sternocleidomastoid and mentalis muscles. ?  ?Impression: ?This NCV/EMG study shows the following: ?No evidence of polyneuropathy or mononeuropathy ?Left chronic C7 +/- C6 radiculopathy. ?Mild chronic denervation was also noted in the  trapezius, sternocleidomastoid and mentalis muscle.  However, there was no acute denervation.  The etiology is uncertain and could be related to her remote history of close head injury. ( Which she had in 2016)  If symptoms worsen over the next few months, consider a reevaluation to determine if spontaneous activity has developed which would be worrisome for a motor neuron disease ?  ?Mandy A. Felecia Shelling, MD, PhD, Charlynn Grimes ?Certified in Neurology, Clinical Neurophysiology, Sleep Medicine, Pain Medicine and Neuroimaging ?Director, [facility] at [facility] ?  ?[facility] ? ? She is on PT with Mandy Durham, and feels she is getting a good work pout. She reports having more tremor, her oral muscles are moving more- uncontrolled- thank goodness no ALS -symptoms have gotten worse.  ? ?DAT scan was abnormal ; 05-29-2021; IMPRESSION: abnormal dopamine distribution and production-   ?"Severe decreased radiotracer activity within the bilateral putamen ?and significant decreased activity in the heads of caudate nuclei. ?This pattern of reduced striatal activity has been associated with Parkinsonian syndrome pathology." ? ? ?All together , we are seeing a dopamine deficiency mediated problem.  ? ? ? ? ?05-05-2021 _ Hyperkinesis.  ? ?02/13/21: MM: Mandy Durham is a 76 year old female with a history of tremor.  She returns today for follow-up.  In reviewing Mandy. Edwena Felty original note at first visit ( referred by Mandy Durham)  it was questionable whether the patient had essential tremor versus vascular parkinsonism?Marland Kitchen   ?Patient returns today reporting that tremor is worse.  While sitting in the exam room she has a significant  tremor at rest in the left hand and both legs.  The patient states that her legs feel weak.  She does use a walker at home.  Denies any recent falls.  Denies any trouble chewing or swallowing food.  She has not taken her Sinemet this morning.  She is not  sure how much the tremor improves after taking Sinemet as she reports she has never paid attention to this.  She returns today for an evaluation. ? ?10/28/20: Mandy Durham is a 76 year old female with a history of tremor.  She returns today for follow-up.  She feels that her tremor is worse.  She states that it occurs mainly with activity.  Primarily in the hands.  She is able to complete all ADLs independently.  She remains on Sinemet 25-100 mg 3 times a day.  Denies any significant changes with her walking.  Does report that she has some back pain and that changes her gait slightly.  She states that her speech is worse.  She has noticed more stuttering.  The patient also has several other complaints during today's visit.  States that she has been depressed.  Her PCP started her on 2 medications but she is no longer on those.  Also notices a decline in her memory, lack of energy and feeling tired throughout the day.  She returns today for an evaluation. ? ?10/26/19: Mandy Durham is a 76 year old female with a history of tremor.  She returns today for follow-up.  She feels that her tremor has remained relatively stable.  She states that she has been under more stress recently and does feel exhausted.  She continues on Sinemet 3 times a day.  Denies any significant changes with her gait.  Denies any falls.  She continues to have trouble with expressive aphasia.  She returns today for follow-up. ? ? ?History of Present Illness:  ?HPI:  Mandy Durham is a 76 y.o. female , seen here as a referral  from Mandy. Kathlene November for a subarachnoid traumatic bleed , sequelae evaluation.  ?Mrs Durham had originally asked for this appointment due to incontinence following a SAH. She had fainted, fell and aquiered the TBI with SAH. She states that the incontinence  is improved. She still has some urine leakage and soft stools. She wonders if this is still a neurologic impairment from the [facility]. She  has no pain with bowel movements, currently no  headaches or nausea. She remains on Mestinon to protect her from fainting spells. ?Patient was last seen at [facility] in April 2019 by Venancio Poisson, NP. Primary neurologist was Mandy. Leonie Man.  ?She had tried carbidopa - Ldopa, and it had initially helped her tremors, which I believed to be essential tremor.  ?She now reports that the tremor is no longer controlled. She is on bid Sinemet 25/ 100 mg.   ?  ?Observations/Objective: no dysphonia, no vocal tremor.  ?Assessment and Plan: increase to TID sinemet, 30 minutes before mealtime. The patient is not diabetic, no history of gastroparesis.  ? ?Follow Up Instructions: patient will call in 3 month and report of observation, effect on tremor.  ? ?RV with Mandy Peru, NP  in alternating fashion.  ? ?  ?I discussed the assessment and treatment plan with the patient. The patient was provided an opportunity to ask questions and all were answered. The patient agreed with the plan and demonstrated an understanding of the instructions. ?  ?The patient was advised to call back or seek an  in-person evaluation if the symptoms worsen or if the condition fails to improve as anticipated. ? ?I provided 12 minutes of non-face-to-face time during this encounter. ? ? ?Mandy Seat, MD ? ? ? ?HPI:  Mandy Durham is a 76 y.o. female , seen here as a referral  from Mandy. Kathlene November for a subarachnoid traumatic bleed , sequelae evaluation.  ? ?Mrs Albertsen had originally asked for this appointment due to incontinence following a SAH. She had fainted, fell and aquiered the TBI with SAH. She states that the incontinence  is improved. She still has some urine leakage and soft stools. She wonders if this is still a neurologic impairment from the [facility]. She  has no pain with bowel movements, currently no headaches or nausea. She remains on Mestinon to protect her from fainting spells. ? ?History from 02/12/2016 CD: I have originally seen Mrs. Daft for one visit in February of 2016,  ?She has  continued to have difficulties since suffering a sub-arachnoid traumatic hemorrhage. She states that ever since she had the fall she has sometimes leaked nasal clear fluid and this discharge had increased ov

## 2021-06-09 NOTE — Patient Instructions (Addendum)
For PD versus PD like conditions.  ? ?Her legs however are weaker than her arms, and on physical examination , the left triceps remains strong as are grip strength in both hands.    ? ?Mandy Durham is a 76 year old  right handed caucasian female with a history of SAH, TBI, HLD, HTN and tremor. Patient was on a beta blocker, benzodiazepine, topiramate and mysoline for her tremors. She was unable to tolerate mysoline and was started on Sinemet 25/100 TID.  ? Now  on sinemet 4 times a day 10-100 mg and I will add low dose requip in AM and at lunch time.  ?She has not found medication to help with tremor- her hand muscles are atrophied.  Her gait it stooped.  ?She has tremor but she is not seen to have resting tremor in her dominant side.  ?DAT scan abnormal - reduced dopamin uptake - that's why we treat as PD. No RLS history.  ? ? ? ?Plan:   ?-Continue sinemet 10-100 four times per day. I will order a low dose requip for daytime.  will add low dose requip in AM and at lunch time.  ? I will order a neuropathy panel )( GNA Panel ) for lower extremity weakness.  ?-Continue to stay active and eat healthy, awareness of posture . PT with Letta Moynahan is going well.  ?-Follow up in 3-6 months with Dory Peru., NP ? ? ?Greater than 50% time during this 38  minute consultation visit was spent on counseling and coordination of care about tremors and use of Sinemet.  Educated on importance of exercise and maintaining a healthy diet.  Patient has no questions or concerns at today's appointment.   ? ? ?Larey Seat, MD ? ?Ropinirole Oral Tablets ?What is this medication? ? ?Use it at breakfast and lunch .  ?ROPINIROLE (roe PIN i role) is used to treat symptoms of Parkinson's disease. It is also used to treat Restless Legs Syndrome. ?This medicine may be used for other purposes; ask your health care provider or pharmacist if you have questions. ?COMMON BRAND NAME(S): Requip ?What should I tell my care team before I take this  medication? ?They need to know if you have any of these conditions: ?heart disease ?high blood pressure ?kidney disease ?liver disease ?low blood pressure ?narcolepsy ?sleep apnea ?smoke tobacco cigarettes ?an unusual or allergic reaction to ropinirole, other medicines, foods, dyes, or preservatives ?pregnant or trying to get pregnant ?breast-feeding ?How should I use this medication? ?Take this medicine by mouth with water. Take it as directed on the prescription label. You can take it with or without food. If it upsets your stomach, take it with food. Keep taking this medicine unless your health care provider tells you to stop. Stopping it too quickly can cause serious side effects. It can also make your condition worse. ?Talk to your health care provider about the use of this medicine in children. Special care may be needed. ?Overdosage: If you think you have taken too much of this medicine contact a poison control center or emergency room at once. ?NOTE: This medicine is only for you. Do not share this medicine with others. ?What if I miss a dose? ?If you miss a dose, take it as soon as you can. If it is almost time for your next dose, take only that dose. Do not take double or extra doses. ?What may interact with this medication? ?alcohol ?antihistamines for allergy, cough and cold ?certain medicines for  depression, anxiety, or psychotic disturbances ?certain medicines for seizures like phenobarbital, primidone ?certain medicines for sleep ?ciprofloxacin ?female hormones, like estrogens and birth control pills ?fluvoxamine ?general anesthetics like halothane, isoflurane, methoxyflurane, propofol ?medicines for blood pressure ?medicines that relax muscles for surgery ?metoclopramide ?narcotic medicines for pain ?rifampin ?tobacco smoking ?This list may not describe all possible interactions. Give your health care provider a list of all the medicines, herbs, non-prescription drugs, or dietary supplements you use.  Also tell them if you smoke, drink alcohol, or use illegal drugs. Some items may interact with your medicine. ?What should I watch for while using this medication? ?Visit your health care provider for regular checks on your progress. Tell your health care provider if your symptoms do not start to get better or if they get worse. Do not suddenly stop taking this medicine. You may develop a severe reaction. Your health care provider will tell you how much medicine to take. If your health care provider wants you to stop the medicine, the dose may be slowly lowered over time to avoid any side effects. ?You may get drowsy or dizzy. Do not drive, use machinery, or do anything that needs mental alertness until you know how this drug affects you. Do not stand or sit up quickly, especially if you are an older patient. This reduces the risk of dizzy or fainting spells. Alcohol may interfere with the effect of this medicine. Avoid alcoholic drinks. ?When taking this medicine, you may fall asleep without notice. You may be doing activities like driving a car, talking, or eating. You may not feel drowsy before it happens. Contact your health care provider right away if this happens to you. ?There have been reports of increased sexual urges or other strong urges such as gambling while taking this medicine. If you experience any of these while taking this medicine, you should report this to your health care provider as soon as possible. ?Your mouth may get dry. Chewing sugarless gum or sucking hard candy and drinking plenty of water may help. Contact your health care provider if the problem does not go away or is severe. ?What side effects may I notice from receiving this medication? ?Side effects that you should report to your doctor or health care professional as soon as possible: ?allergic reactions (skin rash, itching or hives; swelling of the face, lips, or tongue) ?changes in emotions or moods ?changes in  vision ?confusion ?depressed mood ?increased blood pressure ?falling asleep during normal activities like driving ?fast, irregular heartbeat ?hallucinations ?low blood pressure (dizziness; feeling faint or lightheaded, falls; unusually weak or tired) ?new or increased gambling urges, sexual urges, uncontrolled spending, binge or compulsive eating, or other urges ?uncontrollable head, mouth, neck, arm, or leg movements ?Side effects that usually do not require medical attention (report to your doctor or health care professional if they continue or are bothersome): ?constipation ?dizziness ?drowsiness ?dry mouth ?headache ?nausea ?This list may not describe all possible side effects. Call your doctor for medical advice about side effects. You may report side effects to FDA at 1-800-FDA-1088. ?Where should I keep my medication? ?Keep out of the reach of children and pets. ?Store at room temperature between 20 and 25 degrees C (68 and 77 degrees F). Protect from light and moisture. Keep the container tightly closed. Get rid of any unused medicine after the expiration date. ?To get rid of medicines that are no longer needed or have expired: ?Take the medicine to a medicine take-back program. Check with your  pharmacy or law enforcement to find a location. ?If you cannot return the medicine, check the label or package insert to see if the medicine should be thrown out in the garbage or flushed down the toilet. If you are not sure, ask your health care provider. If it is safe to put it in the trash, take the medicine out of the container. Mix the medicine with cat litter, dirt, coffee grounds, or other unwanted substance. Seal the mixture in a bag or container. Put it in the trash. ?NOTE: This sheet is a summary. It may not cover all possible information. If you have questions about this medicine, talk to your doctor, pharmacist, or health care provider. ?? 2022 Elsevier/Gold Standard (2019-10-25 00:00:00) ? ?

## 2021-06-10 ENCOUNTER — Ambulatory Visit: Payer: PPO | Admitting: Physical Therapy

## 2021-06-10 ENCOUNTER — Encounter: Payer: Self-pay | Admitting: Physical Therapy

## 2021-06-10 ENCOUNTER — Other Ambulatory Visit: Payer: Self-pay

## 2021-06-10 DIAGNOSIS — R2681 Unsteadiness on feet: Secondary | ICD-10-CM

## 2021-06-10 DIAGNOSIS — M542 Cervicalgia: Secondary | ICD-10-CM

## 2021-06-10 DIAGNOSIS — R2689 Other abnormalities of gait and mobility: Secondary | ICD-10-CM

## 2021-06-10 DIAGNOSIS — R293 Abnormal posture: Secondary | ICD-10-CM

## 2021-06-10 DIAGNOSIS — M6281 Muscle weakness (generalized): Secondary | ICD-10-CM

## 2021-06-10 NOTE — Therapy (Addendum)
?OUTPATIENT PHYSICAL THERAPY TREATMENT NOTE ? ? ?Patient Name: Mandy Durham ?MRN: [id number] ?DOB:February 21, 1946, 76 y.o., female ?Today's Date: 06/10/2021 ? ?PCP: Colon Branch, MD ?REFERRING PROVIDER: Ward Givens, NP ? ? PT End of Session - 06/10/21 1404   ? ? Visit Number [id number]   ? Number of Visits 31   ? Date for PT Re-Evaluation 08/29/21   ? Authorization Type [employer]   ? PT Start Time 1402   ? PT Stop Time 2707   ? PT Time Calculation (min) 43 min   ? Activity Tolerance Patient tolerated treatment well   ? Behavior During Therapy [facility] for tasks assessed/performed   ? ?  ?  ? ?  ? ? ?Past Medical History:  ?Diagnosis Date  ? Anxiety state, unspecified   ? Hyperlipidemia   ? Hypertension   ? Insomnia   ? LBBB (left bundle branch block)   ? LHC in 2002 showed normal coronaries.   ? Osteoarthrosis, unspecified whether generalized or localized, unspecified site   ? Overweight(278.02)   ? Parkinson disease ([place])   ? S/P placement of cardiac pacemaker   ? a. 02/21/16: Medtronic Advisa DR MRI SureScan model A2DR01 (serial number [id number])   ? Serrated adenoma of colon 2007  ? Symptomatic menopausal or female climacteric states   ? Syncope and collapse   ? 11/12: Holter 12/12 with rare PACS, HR range 64-140, average 82, no significant arrhythmias. Echo (1/13): EF 50-55%, mild LVH, septal-lateral dyssynchrony c/w LBBB. 3-week event monitor (1/13): No significant arrhythmia.   ? ?Past Surgical History:  ?Procedure Laterality Date  ? COLONOSCOPY  2007  ? EP IMPLANTABLE DEVICE N/A 02/21/2016  ? Procedure: Pacemaker Implant;  Surgeon: Will Meredith Leeds, MD;  Location: [facility];  Service: Cardiovascular;  Laterality: N/A;  ? POLYPECTOMY    ? ?Patient Active Problem List  ? Diagnosis Date Noted  ? Neuropathy involving both lower extremities 06/09/2021  ? Complaints of weakness of lower extremity 06/09/2021  ? Abnormal brain MRI 06/09/2021  ? Neck muscle weakness 06/02/2021  ? Diastolic CHF ([place]) 86/75/4492   ? Kyphosis due to degeneration of spine 05/05/2021  ? Drug-induced orofacial dyskinesia 05/05/2021  ? Hyperkinesis 05/05/2021  ? Traumatic intracerebral hemorrhage with loss of consciousness of 31 minutes to 59 minutes (HCC) 05/05/2021  ? Stammering and stuttering 05/05/2021  ? 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 Functional dyspepsia 09/08/2017  ? Slow transit constipation 09/08/2017  ? S/P placement of cardiac pacemaker   ? Mobitz II 02/20/2016  ? Nystagmus, end-position 02/12/2016  ? Dizziness and giddiness 02/12/2016  ? Flapping tremor 02/12/2016  ? Chronic fatigue 10/28/2015  ? PCP NOTES >>>> 02/13/2015  ? TBI (traumatic brain injury) 01/28/2015  ? Essential hypertension   ? Acute post-traumatic headache, not intractable   ? Vertigo due to brain injury   ? SAH (subarachnoid hemorrhage) ([place]) 01/21/2015  ? Annual physical exam 09/07/2014  ? Orthostatic hypotension 08/30/2014  ? Acromioclavicular arthrosis 03/21/2014  ? Allergic rhinitis 02/27/2014  ? SI (sacroiliac) joint dysfunction 02/02/2014  ? Gastrocnemius strain, left 12/11/2013  ? Back pain 08/22/2013  ? LBBB (left bundle branch block) 05/13/2013  ? Shingles outbreak 12/15/2012  ? Borderline diabetes   ? Insomnia 01/12/2008  ? HLD (hyperlipidemia) 02/04/2007  ? Anxiety 10/12/2006  ? Osteoarthritis 10/12/2006  ? ? ?REFERRING DIAG: PD, neck pain ? ?THERAPY DIAG:  ?Abnormal posture ? ?Muscle weakness (generalized) ? ?Cervicalgia ? ?Unsteadiness on feet ? ?Other abnormalities of gait  and mobility ? ?PERTINENT HISTORY: Parkinsonism, anxiety, HLD, HTN, pacemaker, hx of TBI (2016) ? ?Per neurologist's note on 06/09/21: DAT scan abnormal - we treat as PD.  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  ? ?PRECAUTIONS: Fall  ? ?SUBJECTIVE: Not having a good day.    ? ? ?PAIN:  ?Are you having pain? Yes ?NPRS scale: "just a little pain" ?Pain location: back of the neck   ?Pain orientation: Posterior  ?PAIN TYPE: tightness and sore ?Aggravating factors: nothing ?Relieving factors: ibuprofen  ? ? ?TODAY'S TREATMENT:   ? ? ?Pt arrived tearful into session today. Provided emotional support and listened to pt's frustrations/feelings about neurologist's visit yesterday and answered pt's questions. Per neurologist's note:  "DAT scan abnormal - we treat as PD" and "For PD vs. PD like conditions" ? ?Pt asking about what PD is and how she can make it go away. PT providing education on what PD is. Also educated on what pt's neurologists note said from yesterday  and recent results of DAT scan.  ?Discussed pt's medication with pt (taking Sinemet 4 times a day) and newly added Requip (has not picked up yet) and reasoning why pt is taking these meds in regards to findings from neurologist (per Dr. Edwena Felty notes). Educated on making sure pt is taking her Sinemet at a consistent time each day and not taking with protein. ?Due to pt also reporting difficulties with tasks around the house/ADLs and coordination also mentioned possibility of OT and what they would look at/work on with pt. Pt wanted PT to ask for referral and then decide if she would like to pursue OT.  ?   ?At end of session pt wishing to review below exercise given from last session ?PT provided verbal and demo cues for proper technique and reviewed hold time of 20 seconds.  ?Sternocleidomastoid Stretch  - 1 x daily - 7 x weekly - 2 sets - 20 hold ? ?Pt also wishing to review seated Sparks, but did not have time to cover at end of session today.  ? ? ?PATIENT EDUCATION: ?Education details: See above.  ?Person educated: Patient ?Education method: Explanation ?Education comprehension: verbalized understanding ? ? ?HOME EXERCISE PROGRAM: ?Access Code: [email or web address] ?URL: [email or web address] ?Date: 06/06/2021 ?Prepared by: Misty Stanley ? ?Exercises ?- Sidelying Neck Sidebending  - 1 x daily - 7 x weekly - 2 sets - 10  reps ?- Supine Bridge  - 1 x daily - 7 x weekly - 1 sets - 10 reps ?- Isometric Cervical Extension at Wall with Ball  - 1 x daily - 7 x weekly - 2 sets - 5 reps - 10 second hold ?- Scapular Retraction with Resistance  - 1 x daily - 7 x weekly - 2 sets - 5 reps ?- Seated Shoulder Horizontal Abduction with Resistance - Palms Down  - 1 x daily - 7 x weekly - 2 sets - 5 reps ?- Sternocleidomastoid Stretch  - 1 x daily - 7 x weekly - 2 sets - 20 hold ? ?And seated PWR Up/Rock ? ?  ? ? ?  ? ?SHORT TERM GOALS: (updated for re-cert) ALL STGS DUE 2/29/79 ? ?Pt will finish further assessment of miniBEST with LTG written. ?Baseline: 15/28 ?Target date: 07/04/2021 ?Goal status: MET ? ?2.  Pt will consistently report pain in neck as 2/10 or less in order to demo decr pain for functional mobility/gait. ?  Baseline: 4/10 ?Target date: 07/04/2021 ?Goal status: INITIAL ? ?3.  Pt will recover posterior balance in push and release test in 1 robust step independently, for improved balance recovery  ?Baseline: 2 steps ?Target date: 07/04/2021 ?Goal status: INITIAL ? ?4.  Pt will improve cervical extension AROM to at least 15 deg in order to demo improved AROM for functional mobility.  ?Baseline: 12 deg  ?Target date: 07/04/2021 ?Goal status: INITIAL ? ?   ? ?LONG TERM GOALS:(updated for re-cert) ALL LTGS DUE 1/59/47 ? ?Pt will be independent in final HEP with husband supervision in order to build upon functional gains made in therapy. ?Baseline: Pt's husband has not come back into session, therapist needing to review exercises with pt occasionally  ?Target date: 08/01/2021 ?Goal status: INITIAL ? ?2.  Pt will improve miniBEST to at least a 19/28 in order to demo decr fall risk. ? ?Baseline: 15/28 ?Target date: 08/01/2021 ?Goal status: REVISED ? ?3.  Pt will improve cervical AROM of R rotation to 45 degrees and L rotation to 45 degrees in order to demo improved functional mobility.  ?Baseline: R: 40 deg, L 40 deg on 06/02/21 ?Target date:  07/04/2021 ?Goal status: INITIAL ? ?4.  Pt will ambulate at least 200' over indoor and outdoor unlevel surfaces with supervision with no AD vs. LRAD in order to demo improved community mobility.  ? ?Target date: 5

## 2021-06-10 NOTE — Progress Notes (Signed)
Abnormal DAT scan,  tremor, had no response to Sinemet. ?I have asked Dr Tat for an evaluation.

## 2021-06-12 ENCOUNTER — Ambulatory Visit: Payer: PPO | Admitting: Physical Therapy

## 2021-06-12 DIAGNOSIS — M6281 Muscle weakness (generalized): Secondary | ICD-10-CM

## 2021-06-12 DIAGNOSIS — R293 Abnormal posture: Secondary | ICD-10-CM

## 2021-06-12 DIAGNOSIS — M542 Cervicalgia: Secondary | ICD-10-CM

## 2021-06-12 DIAGNOSIS — R2681 Unsteadiness on feet: Secondary | ICD-10-CM

## 2021-06-12 DIAGNOSIS — R2689 Other abnormalities of gait and mobility: Secondary | ICD-10-CM

## 2021-06-12 LAB — COMPREHENSIVE METABOLIC PANEL
ALT: 7 IU/L (ref 0–32)
AST: 17 IU/L (ref 0–40)
Albumin/Globulin Ratio: 2 (ref 1.2–2.2)
Albumin: 4.2 g/dL (ref 3.7–4.7)
Alkaline Phosphatase: 101 IU/L (ref 44–121)
BUN/Creatinine Ratio: 19 (ref 12–28)
BUN: 15 mg/dL (ref 8–27)
Bilirubin Total: 0.3 mg/dL (ref 0.0–1.2)
CO2: 22 mmol/L (ref 20–29)
Calcium: 9.1 mg/dL (ref 8.7–10.3)
Chloride: 102 mmol/L (ref 96–106)
Creatinine, Ser: 0.77 mg/dL (ref 0.57–1.00)
Globulin, Total: 2.1 g/dL (ref 1.5–4.5)
Glucose: 116 mg/dL — ABNORMAL HIGH (ref 70–99)
Potassium: 3.9 mmol/L (ref 3.5–5.2)
Sodium: 138 mmol/L (ref 134–144)
Total Protein: 6.3 g/dL (ref 6.0–8.5)
eGFR: 80 mL/min/{1.73_m2} (ref >59)

## 2021-06-12 LAB — CBC WITH DIFFERENTIAL/PLATELET
Basophils Absolute: 0 10*3/uL (ref 0.0–0.2)
Basos: 1 %
EOS (ABSOLUTE): 0.1 10*3/uL (ref 0.0–0.4)
Eos: 1 %
Hematocrit: 40.5 % (ref 34.0–46.6)
Hemoglobin: 13.2 g/dL (ref 11.1–15.9)
Immature Grans (Abs): 0 10*3/uL (ref 0.0–0.1)
Immature Granulocytes: 0 %
Lymphocytes Absolute: 1.8 10*3/uL (ref 0.7–3.1)
Lymphs: 26 %
MCH: 28.6 pg (ref 26.6–33.0)
MCHC: 32.6 g/dL (ref 31.5–35.7)
MCV: 88 fL (ref 79–97)
Monocytes Absolute: 0.5 10*3/uL (ref 0.1–0.9)
Monocytes: 7 %
Neutrophils Absolute: 4.7 10*3/uL (ref 1.4–7.0)
Neutrophils: 65 %
Platelets: 293 10*3/uL (ref 150–450)
RBC: 4.62 x10E6/uL (ref 3.77–5.28)
RDW: 12.7 % (ref 11.7–15.4)
WBC: 7.1 10*3/uL (ref 3.4–10.8)

## 2021-06-12 LAB — HEMOGLOBIN A1C
Est. average glucose Bld gHb Est-mCnc: 128 mg/dL
Hgb A1c MFr Bld: 6.1 % — ABNORMAL HIGH (ref 4.8–5.6)

## 2021-06-12 LAB — C-REACTIVE PROTEIN: CRP: <1 mg/L (ref 0–10)

## 2021-06-12 LAB — MULTIPLE MYELOMA PANEL, SERUM
Albumin SerPl Elph-Mcnc: 3.6 g/dL (ref 2.9–4.4)
Albumin/Glob SerPl: 1.4 (ref 0.7–1.7)
Alpha 1: 0.3 g/dL (ref 0.0–0.4)
Alpha2 Glob SerPl Elph-Mcnc: 0.5 g/dL (ref 0.4–1.0)
B-Globulin SerPl Elph-Mcnc: 1 g/dL (ref 0.7–1.3)
Gamma Glob SerPl Elph-Mcnc: 0.8 g/dL (ref 0.4–1.8)
Globulin, Total: 2.7 g/dL (ref 2.2–3.9)
IgA/Immunoglobulin A, Serum: 303 mg/dL (ref 64–422)
IgG (Immunoglobin G), Serum: 854 mg/dL (ref 586–1602)
IgM (Immunoglobulin M), Srm: 79 mg/dL (ref 26–217)

## 2021-06-12 LAB — HOMOCYSTEINE: Homocysteine: 18.4 umol/L (ref 0.0–19.2)

## 2021-06-12 LAB — ANA W/REFLEX: ANA Titer 1: NEGATIVE

## 2021-06-12 LAB — TSH: TSH: 1.05 u[IU]/mL (ref 0.450–4.500)

## 2021-06-12 LAB — SJOGREN'S SYNDROME ANTIBODS(SSA + SSB)
ENA SSA (RO) Ab: <0.2 AI (ref 0.0–0.9)
ENA SSB (LA) Ab: <0.2 AI (ref 0.0–0.9)

## 2021-06-12 LAB — VITAMIN B12: Vitamin B-12: 290 pg/mL (ref 232–1245)

## 2021-06-12 LAB — SEDIMENTATION RATE: Sed Rate: 3 mm/hr (ref 0–40)

## 2021-06-12 LAB — METHYLMALONIC ACID, SERUM: Methylmalonic Acid: 329 nmol/L (ref 0–378)

## 2021-06-12 NOTE — Therapy (Signed)
?OUTPATIENT PHYSICAL THERAPY TREATMENT NOTE ? ? ?Patient Name: Mandy Durham ?MRN: [id number] ?DOB:06/22/1945, 76 y.o., female ?Today's Date: 06/13/2021 ? ?PCP: Colon Branch, MD ?REFERRING PROVIDER: Ward Givens, NP ? ? PT End of Session - 06/12/21 1456   ? ? Visit Number 19   ? Number of Visits 31   ? Date for PT Re-Evaluation 08/29/21   ? Authorization Type [employer]   ? Progress Note Due on Visit 20   ? PT Start Time 9147   ? PT Stop Time 1535   ? PT Time Calculation (min) 41 min   ? Activity Tolerance Patient tolerated treatment well   ? Behavior During Therapy [facility] for tasks assessed/performed   ? ?  ?  ? ?  ? ? ?Past Medical History:  ?Diagnosis Date  ? Anxiety state, unspecified   ? Hyperlipidemia   ? Hypertension   ? Insomnia   ? LBBB (left bundle branch block)   ? LHC in 2002 showed normal coronaries.   ? Osteoarthrosis, unspecified whether generalized or localized, unspecified site   ? Overweight(278.02)   ? Parkinson disease ([place])   ? S/P placement of cardiac pacemaker   ? a. 02/21/16: Medtronic Advisa DR MRI SureScan model A2DR01 (serial number [id number])   ? Serrated adenoma of colon 2007  ? Symptomatic menopausal or female climacteric states   ? Syncope and collapse   ? 11/12: Holter 12/12 with rare PACS, HR range 64-140, average 82, no significant arrhythmias. Echo (1/13): EF 50-55%, mild LVH, septal-lateral dyssynchrony c/w LBBB. 3-week event monitor (1/13): No significant arrhythmia.   ? ?Past Surgical History:  ?Procedure Laterality Date  ? COLONOSCOPY  2007  ? EP IMPLANTABLE DEVICE N/A 02/21/2016  ? Procedure: Pacemaker Implant;  Surgeon: Will Meredith Leeds, MD;  Location: [place] [facility];  Service: Cardiovascular;  Laterality: N/A;  ? POLYPECTOMY    ? ?Patient Active Problem List  ? Diagnosis Date Noted  ? Neuropathy involving both lower extremities 06/09/2021  ? Complaints of weakness of lower extremity 06/09/2021  ? Abnormal brain MRI 06/09/2021  ? Neck muscle weakness 06/02/2021   ? Diastolic CHF ([place]) 82/95/6213  ? Kyphosis due to degeneration of spine 05/05/2021  ? Drug-induced orofacial dyskinesia 05/05/2021  ? Hyperkinesis 05/05/2021  ? Traumatic intracerebral hemorrhage with loss of consciousness of 31 minutes to 59 minutes (HCC) 05/05/2021  ? Stammering and stuttering 05/05/2021  ? 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 Functional dyspepsia 09/08/2017  ? Slow transit constipation 09/08/2017  ? S/P placement of cardiac pacemaker   ? Mobitz II 02/20/2016  ? Nystagmus, end-position 02/12/2016  ? Dizziness and giddiness 02/12/2016  ? Flapping tremor 02/12/2016  ? Chronic fatigue 10/28/2015  ? PCP NOTES >>>> 02/13/2015  ? TBI (traumatic brain injury) 01/28/2015  ? Essential hypertension   ? Acute post-traumatic headache, not intractable   ? Vertigo due to brain injury   ? SAH (subarachnoid hemorrhage) ([place]) 01/21/2015  ? Annual physical exam 09/07/2014  ? Orthostatic hypotension 08/30/2014  ? Acromioclavicular arthrosis 03/21/2014  ? Allergic rhinitis 02/27/2014  ? SI (sacroiliac) joint dysfunction 02/02/2014  ? Gastrocnemius strain, left 12/11/2013  ? Back pain 08/22/2013  ? LBBB (left bundle branch block) 05/13/2013  ? Shingles outbreak 12/15/2012  ? Borderline diabetes   ? Insomnia 01/12/2008  ? HLD (hyperlipidemia) 02/04/2007  ? Anxiety 10/12/2006  ? Osteoarthritis 10/12/2006  ? ? ?REFERRING DIAG: PD, neck pain ? ?THERAPY DIAG:  ?Abnormal posture ? ?Muscle weakness (generalized) ? ?Cervicalgia ? ?  Unsteadiness on feet ? ?Other abnormalities of gait and mobility ? ?PERTINENT HISTORY: Parkinsonism, anxiety, HLD, HTN, pacemaker, hx of TBI (2016) ? ?Per neurologist's note on 06/09/21: DAT scan abnormal - we treat as PD.  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  ? ?PRECAUTIONS: Fall  ? ?SUBJECTIVE: No changes since Tuesday.  Forgot scheduling book so asked to wait until Monday to schedule more  appointments.  Having some neck pain today.  Felt like last session was very helpful and Chloe was very helpful! ? ?PAIN:  ?Are you having pain? Yes ?NPRS scale: "just a little pain" ?Pain location: back of the neck  ?Pain orientation: Posterior  ?PAIN TYPE: tightness and sore ?Aggravating factors: nothing ?Relieving factors: ibuprofen  ? ? ?TODAY'S TREATMENT:   ? ?06/12/21: ?Performed postural exercises in supine today to utilize gravity and position to facilitate stretch of anterior neck and chest muscles and promote neck, shoulder, trunk and hip extension.  Pt still requires 2 pillows for head support. ? 06/12/21 1533  ?Moves in Supine  ?PWR! Up 5  ?PWR! Rock 5  ?PWR! Twist 5  ?PWR! Step 5  ?Comments Prior to beginning supine postural exercises performed posterior neck, shoulder and hip activation training with isolated scap retraction x 5 reps bilaterally with therapist providing manual overpressure to front of shoulders/chest, 5 reps chin tuck/neck retractions isometrically, and then combined neck and shoulder retraction with bridging x 5 reps.  When performing supine postural exercises pt required total verbal, visual and tactile cues for sequencing and activation.  ? ? Transitioned to sitting and performed anterior pelvic tilts with thoracic and cervical extension with 5-6 second hold x 3 reps.  Pt demonstrated more upright head position at end of session.  ?  ?  ?06/10/21: ?Pt arrived tearful into session today. Provided emotional support and listened to pt's frustrations/feelings about neurologist's visit yesterday and answered pt's questions. Per neurologist's note:  "DAT scan abnormal - we treat as PD" and "For PD vs. PD like conditions" ? ?Pt asking about what PD is and how she can make it go away. PT providing education on what PD is. Also educated on what pt's neurologists note said from yesterday  and recent results of DAT scan.  ?Discussed pt's medication with pt (taking Sinemet 4 times a day) and  newly added Requip (has not picked up yet) and reasoning why pt is taking these meds in regards to findings from neurologist (per Dr. Edwena Felty notes). Educated on making sure pt is taking her Sinemet at a consistent time each day and not taking with protein. ?Due to pt also reporting difficulties with tasks around the house/ADLs and coordination also mentioned possibility of OT and what they would look at/work on with pt. Pt wanted PT to ask for referral and then decide if she would like to pursue OT.  ?   ?At end of session pt wishing to review below exercise given from last session ?PT provided verbal and demo cues for proper technique and reviewed hold time of 20 seconds.  ?Sternocleidomastoid Stretch  - 1 x daily - 7 x weekly - 2 sets - 20 hold ? ?Pt also wishing to review seated [place], but did not have time to cover at end of session today.  ? ? ?PATIENT EDUCATION: ?Education details: Reviewed supine postural exercises but did not recommend pt to perform at home right now.  ?Person educated:  Patient ?Education method: Explanation ?Education comprehension: verbalized understanding ? ? ?HOME EXERCISE PROGRAM: ?Access Code: [email or web address] ?URL: [email or web address] ?Date: 06/06/2021 ?Prepared by: Misty Stanley ? ?Exercises ?- Sidelying Neck Sidebending  - 1 x daily - 7 x weekly - 2 sets - 10 reps ?- Supine Bridge  - 1 x daily - 7 x weekly - 1 sets - 10 reps ?- Isometric Cervical Extension at Wall with Ball  - 1 x daily - 7 x weekly - 2 sets - 5 reps - 10 second hold ?- Scapular Retraction with Resistance  - 1 x daily - 7 x weekly - 2 sets - 5 reps ?- Seated Shoulder Horizontal Abduction with Resistance - Palms Down  - 1 x daily - 7 x weekly - 2 sets - 5 reps ?- Sternocleidomastoid Stretch  - 1 x daily - 7 x weekly - 2 sets - 20 hold ? ?And seated PWR Up/Rock ? ?  ? ? ?  ? ?SHORT TERM GOALS: (updated for re-cert) ALL STGS DUE 3/40/68 ? ?Pt will finish further assessment of miniBEST with LTG  written. ?Baseline: 15/28 ?Target date: 07/04/2021 ?Goal status: MET ? ?2.  Pt will consistently report pain in neck as 2/10 or less in order to demo decr pain for functional mobility/gait. ?Baseline: 4/10 ?Target dat

## 2021-06-13 ENCOUNTER — Ambulatory Visit: Payer: PPO | Admitting: Pulmonary Disease

## 2021-06-13 DIAGNOSIS — M19012 Primary osteoarthritis, left shoulder: Secondary | ICD-10-CM

## 2021-06-13 DIAGNOSIS — E785 Hyperlipidemia, unspecified: Secondary | ICD-10-CM | POA: Diagnosis not present

## 2021-06-13 NOTE — Progress Notes (Signed)
Best Hba1c in the last 3 years ! No evidence of any other cause of neuropathy.

## 2021-06-16 ENCOUNTER — Ambulatory Visit: Payer: PPO | Attending: Adult Health | Admitting: Physical Therapy

## 2021-06-16 ENCOUNTER — Telehealth: Payer: Self-pay | Admitting: *Deleted

## 2021-06-16 VITALS — BP 160/80 | HR 69

## 2021-06-16 DIAGNOSIS — M6281 Muscle weakness (generalized): Secondary | ICD-10-CM | POA: Diagnosis not present

## 2021-06-16 DIAGNOSIS — R2681 Unsteadiness on feet: Secondary | ICD-10-CM | POA: Insufficient documentation

## 2021-06-16 DIAGNOSIS — R293 Abnormal posture: Secondary | ICD-10-CM | POA: Diagnosis not present

## 2021-06-16 DIAGNOSIS — M542 Cervicalgia: Secondary | ICD-10-CM | POA: Diagnosis not present

## 2021-06-16 NOTE — Therapy (Signed)
?OUTPATIENT PHYSICAL THERAPY TREATMENT NOTE/10th VISIT PN ? ? ?Patient Name: Mandy Durham ?MRN: [id number] ?DOB:[birth year], 76 y.o., female ?Today's Date: 06/16/2021 ? ?10th Visit Physical Therapy Progress Note ? ?Dates of Reporting Period: 05/07/21 to 06/16/21 ? ? ?PCP: Colon Branch, MD ?REFERRING PROVIDER: Ward Givens, NP ? ? PT End of Session - 06/16/21 1321   ? ? Visit Number 20   ? Number of Visits 31   ? Date for PT Re-Evaluation 08/29/21   ? Authorization Type [employer]   ? Progress Note Due on Visit 20   ? PT Start Time 1318   ? Activity Tolerance Patient tolerated treatment well   ? Behavior During Therapy [facility] for tasks assessed/performed   ? ?  ?  ? ?  ? ? ?Past Medical History:  ?Diagnosis Date  ? Anxiety state, unspecified   ? Hyperlipidemia   ? Hypertension   ? Insomnia   ? LBBB (left bundle branch block)   ? LHC in 2002 showed normal coronaries.   ? Osteoarthrosis, unspecified whether generalized or localized, unspecified site   ? Overweight(278.02)   ? Parkinson disease ([place])   ? S/P placement of cardiac pacemaker   ? a. 02/21/16: Medtronic Advisa DR MRI SureScan model A2DR01 (serial number [id number])   ? Serrated adenoma of colon 2007  ? Symptomatic menopausal or female climacteric states   ? Syncope and collapse   ? 11/12: Holter 12/12 with rare PACS, HR range 64-140, average 82, no significant arrhythmias. Echo (1/13): EF 50-55%, mild LVH, septal-lateral dyssynchrony c/w LBBB. 3-week event monitor (1/13): No significant arrhythmia.   ? ?Past Surgical History:  ?Procedure Laterality Date  ? COLONOSCOPY  2007  ? EP IMPLANTABLE DEVICE N/A 02/21/2016  ? Procedure: Pacemaker Implant;  Surgeon: Will Meredith Leeds, MD;  [facility];  Service: Cardiovascular;  Laterality: N/A;  ? POLYPECTOMY    ? ?Patient Active Problem List  ? Diagnosis Date Noted  ? Neuropathy involving both lower extremities 06/09/2021  ? Complaints of weakness of lower extremity 06/09/2021  ? Abnormal brain  MRI 06/09/2021  ? Neck muscle weakness 06/02/2021  ? Diastolic CHF ([place]) 67/20/9470  ? Kyphosis due to degeneration of spine 05/05/2021  ? Drug-induced orofacial dyskinesia 05/05/2021  ? Hyperkinesis 05/05/2021  ? Traumatic intracerebral hemorrhage with loss of consciousness of 31 minutes to 59 minutes (HCC) 05/05/2021  ? Stammering and stuttering 05/05/2021  ? 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 Functional dyspepsia 09/08/2017  ? Slow transit constipation 09/08/2017  ? S/P placement of cardiac pacemaker   ? Mobitz II 02/20/2016  ? Nystagmus, end-position 02/12/2016  ? Dizziness and giddiness 02/12/2016  ? Flapping tremor 02/12/2016  ? Chronic fatigue 10/28/2015  ? PCP NOTES >>>> 02/13/2015  ? TBI (traumatic brain injury) ([place]) 01/28/2015  ? Essential hypertension   ? Acute post-traumatic headache, not intractable   ? Vertigo due to brain injury [facility]   ? SAH (subarachnoid hemorrhage) ([place]) 01/21/2015  ? Annual physical exam 09/07/2014  ? Orthostatic hypotension 08/30/2014  ? Acromioclavicular arthrosis 03/21/2014  ? Allergic rhinitis 02/27/2014  ? SI (sacroiliac) joint dysfunction 02/02/2014  ? Gastrocnemius strain, left 12/11/2013  ? Back pain 08/22/2013  ? LBBB (left bundle branch block) 05/13/2013  ? Shingles outbreak 12/15/2012  ? Borderline diabetes   ? Insomnia 01/12/2008  ? HLD (hyperlipidemia) 02/04/2007  ? Anxiety 10/12/2006  ? Osteoarthritis 10/12/2006  ? ? ?REFERRING DIAG: PD, neck pain ? ?THERAPY DIAG:  ?Abnormal posture ? ?Muscle  weakness (generalized) ? ?Unsteadiness on feet ? ?Cervicalgia ? ?PERTINENT HISTORY: Parkinsonism, anxiety, HLD, HTN, pacemaker, hx of TBI (2016) ? ?Per neurologist's note on 06/09/21: DAT scan abnormal - we treat as PD.  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  ? ?PRECAUTIONS: Fall  ? ?SUBJECTIVE: Feeling a little dizzy today. Has not had much water. Requesting to work on  supine exercises today.  ? ?PAIN:  ?Are you having pain? Yes ?NPRS scale: "just a little pain" ?Pain location: back of the neck  ?Pain orientation: Posterior  ?PAIN TYPE: tightness and sore ?Aggravating factors: nothing ?Relieving factors: ibuprofen  ? ?Vitals:  ? 06/16/21 1331  ?BP: (!) 160/80  ?Pulse: 69  ?SpO2: 98%  ? ? ? ?TODAY'S TREATMENT:   ? ?Supine on mat table with towel rolled vertically under pt's spine to utilize gravity to stretch anterior neck and chest muscles. 2 pillows needed for head support.  ?-x10 reps scapular retraction (Supine PWR Up) with 5 sec hold, with verbal/tactile/demo cues for technique. Additional manual overpressure provided to front of shoulders/chest for an additional stretch.  ?-horizontal shoulder ABD x10 reps with yellow band, cues for full ROM.  ?- 2 sets of 5 reps of bridging with right lateral isometric neck flexion (verbal and tactile cues to press into therapist's hand) ?-prone PWR Twist (performed as open book) x5 reps each side - verbal/demo/tactile cues for proper technique. ? ?Pt needing intermittent rest breaks between exercises due to fatigue.  ?  ?  ? ?PATIENT EDUCATION: ?Education details: Scheduling more visits, importance of drinking water, answered pt's questions regarding her medications from the Neurologist  ?Person educated: Patient ?Education method: Explanation ?Education comprehension: verbalized understanding ? ? ?HOME EXERCISE PROGRAM: ?Access Code: [email or web address] ?URL: [email or web address] ?Date: 06/06/2021 ?Prepared by: Misty Stanley ? ?Exercises ?- Sidelying Neck Sidebending  - 1 x daily - 7 x weekly - 2 sets - 10 reps ?- Supine Bridge  - 1 x daily - 7 x weekly - 1 sets - 10 reps ?- Isometric Cervical Extension at Wall with Ball  - 1 x daily - 7 x weekly - 2 sets - 5 reps - 10 second hold ?- Scapular Retraction with Resistance  - 1 x daily - 7 x weekly - 2 sets - 5 reps ?- Seated Shoulder Horizontal Abduction with Resistance - Palms Down  - 1 x  daily - 7 x weekly - 2 sets - 5 reps ?- Sternocleidomastoid Stretch  - 1 x daily - 7 x weekly - 2 sets - 20 hold ? ?And seated PWR Up/Rock ? ?  ? ? ?  ? ?SHORT TERM GOALS: (updated for re-cert) ALL STGS DUE 1/61/09 ? ?Pt will finish further assessment of miniBEST with LTG written. ?Baseline: 15/28 ?Target date: 07/04/2021 ?Goal status: MET ? ?2.  Pt will consistently report pain in neck as 2/10 or less in order to demo decr pain for functional mobility/gait. ?Baseline: 4/10 ?Target date: 07/04/2021 ?Goal status: INITIAL ? ?3.  Pt will recover posterior balance in push and release test in 1 robust step independently, for improved balance recovery  ?Baseline: 2 steps ?Target date: 07/04/2021 ?Goal status: INITIAL ? ?4.  Pt will improve cervical extension AROM to at least 15 deg in order to demo improved AROM for functional mobility.  ?Baseline: 12 deg  ?Target date: 07/04/2021 ?Goal status: INITIAL ? ?   ? ?LONG TERM GOALS:(updated for re-cert) ALL LTGS  DUE 07/28/21 ? ?Pt will be independent in final HEP with husband supervision in order to build upon functional gains made in therapy. ?Baseline: Pt's husband has not come back into session, therapist needing to review exercises with pt occasionally  ?Target date: 08/01/2021 ?Goal status: INITIAL ? ?2.  Pt will improve miniBEST to at least a 19/28 in order to demo decr fall risk. ? ?Baseline: 15/28 ?Target date: 08/01/2021 ?Goal status: REVISED ? ?3.  Pt will improve cervical AROM of R rotation to 45 degrees and L rotation to 45 degrees in order to demo improved functional mobility.  ?Baseline: R: 40 deg, L 40 deg on 06/02/21 ?Target date: 07/04/2021 ?Goal status: INITIAL ? ?4.  Pt will ambulate at least 200' over indoor and outdoor unlevel surfaces with supervision with no AD vs. LRAD in order to demo improved community mobility.  ? ?Target date: 08/01/2021 ?Goal status: ONGOING ? ?5.  Pt will recover lateral balance in push and release test in 1 robust step independently, for  improved balance recovery ?Baseline:  R lateral = unable to ellicit a step, L lateral =  2 steps ?Target date: 08/01/2021 ?Goal status: INITIAL ? ? ?   ?Plan  ?Clinical Impression Statement 10th visit PN: P

## 2021-06-16 NOTE — Telephone Encounter (Signed)
LVM relaying results per Dr. Edwena Felty note. She will be getting a call (if not already) from Dr. Doristine Devoid office for appt for second opinion. Asked her to call back if she is not able to get this scheduled. ?

## 2021-06-16 NOTE — Telephone Encounter (Signed)
-----   Message from Larey Seat, MD sent at 06/13/2021  4:17 PM EDT ----- ?Best Hba1c in the last 3 years ! No evidence of any other cause of neuropathy. ?

## 2021-06-18 ENCOUNTER — Ambulatory Visit: Payer: PPO | Admitting: Physical Therapy

## 2021-06-18 ENCOUNTER — Encounter: Payer: Self-pay | Admitting: Physical Therapy

## 2021-06-18 DIAGNOSIS — R293 Abnormal posture: Secondary | ICD-10-CM

## 2021-06-18 DIAGNOSIS — R2681 Unsteadiness on feet: Secondary | ICD-10-CM

## 2021-06-18 DIAGNOSIS — M6281 Muscle weakness (generalized): Secondary | ICD-10-CM

## 2021-06-18 NOTE — Therapy (Signed)
?OUTPATIENT PHYSICAL THERAPY TREATMENT NOTE/10th VISIT PN ? ? ?Patient Name: Mandy Durham ?MRN: [id number] ?DOB:Feb 12, 1946, 76 y.o., female ?Today's Date: 06/18/2021 ? ? ?PCP: Colon Branch, MD ?REFERRING PROVIDER: Ward Givens, NP ? ? PT End of Session - 06/18/21 1500   ? ? Visit Number 21   ? Number of Visits 31   ? Date for PT Re-Evaluation 08/29/21   ? Authorization Type [employer]   ? Progress Note Due on Visit 20   ? PT Start Time 1455   pt late to session  ? PT Stop Time 1535   ? PT Time Calculation (min) 40 min   ? Activity Tolerance Patient tolerated treatment well   ? Behavior During Therapy [facility] for tasks assessed/performed   ? ?  ?  ? ?  ? ? ?Past Medical History:  ?Diagnosis Date  ? Anxiety state, unspecified   ? Hyperlipidemia   ? Hypertension   ? Insomnia   ? LBBB (left bundle branch block)   ? LHC in 2002 showed normal coronaries.   ? Osteoarthrosis, unspecified whether generalized or localized, unspecified site   ? Overweight(278.02)   ? Parkinson disease ([place])   ? S/P placement of cardiac pacemaker   ? a. 02/21/16: Medtronic Advisa DR MRI SureScan model A2DR01 (serial number [id number])   ? Serrated adenoma of colon 2007  ? Symptomatic menopausal or female climacteric states   ? Syncope and collapse   ? 11/12: Holter 12/12 with rare PACS, HR range 64-140, average 82, no significant arrhythmias. Echo (1/13): EF 50-55%, mild LVH, septal-lateral dyssynchrony c/w LBBB. 3-week event monitor (1/13): No significant arrhythmia.   ? ?Past Surgical History:  ?Procedure Laterality Date  ? COLONOSCOPY  2007  ? EP IMPLANTABLE DEVICE N/A 02/21/2016  ? Procedure: Pacemaker Implant;  Surgeon: Will Meredith Leeds, MD;  Location: [place] [facility];  Service: Cardiovascular;  Laterality: N/A;  ? POLYPECTOMY    ? ?Patient Active Problem List  ? Diagnosis Date Noted  ? Neuropathy involving both lower extremities 06/09/2021  ? Complaints of weakness of lower extremity 06/09/2021  ? Abnormal brain MRI 06/09/2021   ? Neck muscle weakness 06/02/2021  ? Diastolic CHF ([place]) 26/83/4196  ? Kyphosis due to degeneration of spine 05/05/2021  ? Drug-induced orofacial dyskinesia 05/05/2021  ? Hyperkinesis 05/05/2021  ? Traumatic intracerebral hemorrhage with loss of consciousness of 31 minutes to 59 minutes (HCC) 05/05/2021  ? Stammering and stuttering 05/05/2021  ? 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 Functional dyspepsia 09/08/2017  ? Slow transit constipation 09/08/2017  ? S/P placement of cardiac pacemaker   ? Mobitz II 02/20/2016  ? Nystagmus, end-position 02/12/2016  ? Dizziness and giddiness 02/12/2016  ? Flapping tremor 02/12/2016  ? Chronic fatigue 10/28/2015  ? PCP NOTES >>>> 02/13/2015  ? TBI (traumatic brain injury) ([place]) 01/28/2015  ? Essential hypertension   ? Acute post-traumatic headache, not intractable   ? Vertigo due to brain injury [facility])   ? SAH (subarachnoid hemorrhage) ([place]) 01/21/2015  ? Annual physical exam 09/07/2014  ? Orthostatic hypotension 08/30/2014  ? Acromioclavicular arthrosis 03/21/2014  ? Allergic rhinitis 02/27/2014  ? SI (sacroiliac) joint dysfunction 02/02/2014  ? Gastrocnemius strain, left 12/11/2013  ? Back pain 08/22/2013  ? LBBB (left bundle branch block) 05/13/2013  ? Shingles outbreak 12/15/2012  ? Borderline diabetes   ? Insomnia 01/12/2008  ? HLD (hyperlipidemia) 02/04/2007  ? Anxiety 10/12/2006  ? Osteoarthritis 10/12/2006  ? ? ?REFERRING DIAG: PD, neck pain ? ?THERAPY  DIAG:  ?Abnormal posture ? ?Muscle weakness (generalized) ? ?Unsteadiness on feet ? ?PERTINENT HISTORY: Parkinsonism, anxiety, HLD, HTN, pacemaker, hx of TBI (2016) ? ?Per neurologist's note on 06/09/21: DAT scan abnormal - we treat as PD.  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  ? ?PRECAUTIONS: Fall  ? ?SUBJECTIVE: Asking questions about her medications. No other changes.  ? ?PAIN:  ?Are you having pain? Yes ?NPRS scale:  "just a little pain" ?Pain location: back of the neck  ?Pain orientation: Posterior  ?PAIN TYPE: tightness and sore ?Aggravating factors: nothing ?Relieving factors: ibuprofen  ? ?There were no vitals filed for this visit. ? ? ? ?TODAY'S TREATMENT:   ? ?Self-Care: ?Pt asking questions about her Carbidopa Levodopa and if she can take 1/2 pills instead of the full pills as it is not making her feel well. Discussed that questions regarding any changes to her medications or side effects needs to be talked about with her neurologist (Dr. Brett Fairy). Pt not sure how to use MyChart. Educated that pt would need to call her neurologist's office and ask and leave a message ?Gave pt information to contact Dr. Doristine Devoid office to make an appt for a 2nd opinion (as recommended by her current neurologist and a referral has been sent to Dr. Doristine Devoid office). ?Answered pt's questions to the best of PT's ability regarding pt's posture and what PT is working on in regards to this in therapy.  ?Pt getting frustrated that she feels good with exercises in therapy but does not remember how to do them at home. Asked if pt's husband or a family member can come to a future session for review of HEP and to improve carryover at home.  ? ?Therapeutic Exercise: ?NuStep gear 1.0 for 3 minutes with BUE/BLE for strengthening, activity tolerance, and ROM. Pt only able to tolerate 3 minutes before fatigue. Post: O2 98%, HR: 70 bpm ?Standing with yellow tband in door: 2 sets of 5 reps of lat pull downs, 2 sets of 5 reps scapular retraction rows. Verbal/demo/tactile cues for proper performance. Pt reporting feeling good with exercises. Needed seated rest break in between d/t fatigue.  ? ? ? ?PATIENT EDUCATION: ?Education details: See self care above. ? ?Person educated: Patient ?Education method: Explanation ?Education comprehension: verbalized understanding ? ? ?HOME EXERCISE PROGRAM: ?Access Code: [email or web address] ?URL: [email or web address] ?Date:  06/06/2021 ?Prepared by: Misty Stanley ? ?Exercises ?- Sidelying Neck Sidebending  - 1 x daily - 7 x weekly - 2 sets - 10 reps ?- Supine Bridge  - 1 x daily - 7 x weekly - 1 sets - 10 reps ?- Isometric Cervical Extension at Wall with Ball  - 1 x daily - 7 x weekly - 2 sets - 5 reps - 10 second hold ?- Scapular Retraction with Resistance  - 1 x daily - 7 x weekly - 2 sets - 5 reps ?- Seated Shoulder Horizontal Abduction with Resistance - Palms Down  - 1 x daily - 7 x weekly - 2 sets - 5 reps ?- Sternocleidomastoid Stretch  - 1 x daily - 7 x weekly - 2 sets - 20 hold ? ?And seated PWR Up/Rock ? ?  ? ? ?  ? ?SHORT TERM GOALS: (updated for re-cert) ALL STGS DUE 06/25/85 ? ?Pt will finish further assessment of miniBEST with LTG written. ?Baseline: 15/28 ?Target date: 07/04/2021 ?Goal status: MET ? ?2.  Pt will consistently report  pain in neck as 2/10 or less in order to demo decr pain for functional mobility/gait. ?Baseline: 4/10 ?Target date: 07/04/2021 ?Goal status: INITIAL ? ?3.  Pt will recover posterior balance in push and release test in 1 robust step independently, for improved balance recovery  ?Baseline: 2 steps ?Target date: 07/04/2021 ?Goal status: INITIAL ? ?4.  Pt will improve cervical extension AROM to at least 15 deg in order to demo improved AROM for functional mobility.  ?Baseline: 12 deg  ?Target date: 07/04/2021 ?Goal status: INITIAL ? ?   ? ?LONG TERM GOALS:(updated for re-cert) ALL LTGS DUE 06/12/05 ? ?Pt will be independent in final HEP with husband supervision in order to build upon functional gains made in therapy. ?Baseline: Pt's husband has not come back into session, therapist needing to review exercises with pt occasionally  ?Target date: 08/01/2021 ?Goal status: INITIAL ? ?2.  Pt will improve miniBEST to at least a 19/28 in order to demo decr fall risk. ? ?Baseline: 15/28 ?Target date: 08/01/2021 ?Goal status: REVISED ? ?3.  Pt will improve cervical AROM of R rotation to 45 degrees and L rotation to  45 degrees in order to demo improved functional mobility.  ?Baseline: R: 40 deg, L 40 deg on 06/02/21 ?Target date: 07/04/2021 ?Goal status: INITIAL ? ?4.  Pt will ambulate at least 200' over indoor and outd

## 2021-06-19 NOTE — Progress Notes (Signed)
? ?Cardiology Office Note ?Date:  06/20/2021  ?Patient ID:  Dyani Quinlyn, Tep [birth year], MRN [id number] ?PCP:  Colon Branch, MD  ?Cardiologist: Dr. Aundra Dubin (2017)  ?Electrophysiologist:  Dr. Curt Bears ? ?  ?Chief Complaint:   planned follow up ? ?History of Present Illness: ?Deaundra KIMEKA BADOUR is a 76 y.o. female with history of HTN, HLD, LBBB (since around 2000, remote notes report LHC in 2002 showed normal coronaries), traumatic SAH (2016)advanced heart block w/PPM. ?She has a stuttering speech pattern and a R hand/forearm tremor she state since the time of her head injury. ?Vascular Parkinsonism ? ?I saw her 10/06/19, for CP ?She reports for the last couple months getting SOB easily.  Reports that before she had no difficulties with her ADLs, gives an example of walking to her mail box and back without SOB and now has to catch her breath at the mailbox to walk back, and is SOB once back at the house.  When asked how far a walk it is she reports "not too far" ?She will get winded with some house chores, not others.  This very unusual for her. ?When asked about CP, she says no, just very SOB. ?Discussed if any heaviness, pressure, she says no. ?She mentions that she told Dr. Curt Bears last time here and he stopped one of her medicines but that did not help.  When I tell her that was end of March she is surprised, states she did not think she had been feeling poorly that long. ?She is certain and firm that she is not having CP, but the SOB does make her anxious ?No rest SOB, no dizzy spells, near syncope or syncope  ?She denies any swelling ,bolating, no symptoms of orthopnea or PND. ?Noted VP only 2%, planned for an echo and stress test to evaluate her progressive DOE ?Stress test was low risk with no ischemia or prior infarct ?Echo looked OK, LVEF 50-55%, Anteroseptal and inferoseptal hypokinesis.  Left ventricular diastolic parameters are consistent with Grade I diastolic dysfunction (impaired relaxation). Elevated left  ventricular end-diastolic pressure. ?RV OK, no significant VHD ?She was started on lasix and then uptitrated and planned for follow up f/u BMET was stable ? ?F/u 12/04/19 ?She feels OK, thinks the lasix has helped, still has some days that she feels a little winded with activities, but all in all better. ?No CP, palpitations ?No rest SOB, no symptoms of PND or orthopnea, though wakes a few times to urinate. ?Since our last visit she has hd once or twice both while seated a very fleeting/sudden dizzy spell that she though she was blacking out.  Over a fast as it started. ?No changes, labs done, planned to monitor for recurrent symptoms ? ?She has seen Dr. Curt Bears subsequently doing well, no changes made at his visit  ?More recently saw A. Albertina [place], [place], March 2022 he discussed worsening SOB over the year despiet  and referred to pulmonology given unremarkable cardiac work up. ? ? ?I saw her 01/17/21 ?She feels pretty lousy, says her SOB is significant and she can hardly take care of the day to day activities. ?That being said, she reports that she has felt this way at least 49mo since she saw AJonni Sanger and has been SOB for more then a year. ?SHe is clear that how she feels today is how she felt in March, not particularly escalating, but none better. ?She saw pulmonology and reports that she was supposed to be getting a test done, but has  not yet ?She has had some CP this is random, Infrequent sharp and momentary ?No near syncope or syncope. ?She denies symptoms of orthopnea, sometimes she feels SOB seated and standing up seems to help, though her SOB is always the worst when exerting herself, settles after a few minutes ?No palpitations ? ?Felt to be volume OL,, turned out she was not taking lasix it seemed since originally prescribed, thinking was to use when she was having difficulty urinating ?She reported her current state of symptoms as ongoing for a year, had not had any acute/recent progression ?She was advised to  start the lasix taking it daily , updated labs ?Discussed ER precautions with her ? ?She saw her PMD 01/22/21, discussed her anxiety better with Lexapro, and failure to thrive ?Described as slightly more frail, was caring for her huband ?No mention or noted c/o SOB ? ?I saw her 01/31/21 ?She is a little less SOB but not resolved or to her baseline ?No CP, no palpitations ?No near syncope or syncope ?LOOKED much better, though some volume OL remained ?Lasix was pulsed again ? ?02/13/21, saw neurology team with increased parkinson symptoms and her Sinemet increased and planned for PT ?02/20/21, saw pulmonary, SOB felt multifactorial, with some atopic symptoms trial on  ICS/LABA although not optimistic will be very helpful ? ? ?I saw her 03/26/21 ?She again looks better to me, leaning less, breathing comfortably, more conversational then a couple visits ago for sure ?She reports that she feels SOB and thinks she needs more furosemide. ?By her observation for the few days on higher furosemide her breathing was better but then at '20mg'$  daily gt more winded again. ?No CP, no palpitations ?No near syncope or syncope ?She is very troubled with the thought she has or may have Parkinson's ?Her lasix was increased some to alternating days of '40mg'$ /'20mg'$  and f/u labs ? ?3/27: K+ 3.9, Creat 0.77 ? ? ?TODAY ?Still w/DOE, not better, not worse ?No rest SOB ?NO CP, palpitations ?No dizzy spells, near syncope or syncope ? ? ? ? ?Device information ?MDT dual chamber PPM implanted 02/21/2016 ? ?Past Medical History:  ?Diagnosis Date  ? Anxiety state, unspecified   ? Hyperlipidemia   ? Hypertension   ? Insomnia   ? LBBB (left bundle branch block)   ? LHC in 2002 showed normal coronaries.   ? Osteoarthrosis, unspecified whether generalized or localized, unspecified site   ? Overweight(278.02)   ? Parkinson disease ([place])   ? S/P placement of cardiac pacemaker   ? a. 02/21/16: Medtronic Advisa DR MRI SureScan model A2DR01 (serial number [id number]  H)   ? Serrated adenoma of colon 2007  ? Symptomatic menopausal or female climacteric states   ? Syncope and collapse   ? 11/12: Holter 12/12 with rare PACS, HR range 64-140, average 82, no significant arrhythmias. Echo (1/13): EF 50-55%, mild LVH, septal-lateral dyssynchrony c/w LBBB. 3-week event monitor (1/13): No significant arrhythmia.   ? ? ?Past Surgical History:  ?Procedure Laterality Date  ? COLONOSCOPY  2007  ? EP IMPLANTABLE DEVICE N/A 02/21/2016  ? Procedure: Pacemaker Implant;  Surgeon: Will Meredith Leeds, MD;  Location: [place] [facility];  Service: Cardiovascular;  Laterality: N/A;  ? POLYPECTOMY    ? ? ?Current Outpatient Medications  ?Medication Sig Dispense Refill  ? acetaminophen (TYLENOL) 500 MG tablet Take 500 mg by mouth every 6 (six) hours as needed.    ? aspirin EC 81 MG tablet Take 81 mg by mouth daily.  Swallow whole.    ? carbidopa-levodopa (SINEMET) 10-100 MG tablet Take 1 tablet by mouth 4 (four) times daily. 120 tablet 5  ? Cholecalciferol (VITAMIN D PO) Take 1 tablet by mouth 2 (two) times a week. Vitamin D    ? escitalopram (LEXAPRO) 10 MG tablet Take 1 tablet (10 mg total) by mouth daily. 30 tablet 6  ? ezetimibe (ZETIA) 10 MG tablet Take 1 tablet (10 mg total) by mouth daily. 90 tablet 1  ? furosemide (LASIX) 20 MG tablet Take 1 tablet (20 mg total) by mouth daily. Alternating 40 mg every other day 90 tablet 3  ? losartan (COZAAR) 100 MG tablet Take 1 tablet (100 mg total) by mouth daily. 30 tablet 3  ? nitroGLYCERIN (NITROSTAT) 0.4 MG SL tablet Place 1 tablet (0.4 mg total) under the tongue every 5 (five) minutes x 3 doses as needed for chest pain. 25 tablet 1  ? potassium chloride (KLOR-CON) 10 MEQ tablet Take 1 tablet (10 mEq total) by mouth daily. 90 tablet 3  ? rOPINIRole (REQUIP) 0.5 MG tablet Take 1 tablet (0.5 mg total) by mouth 2 (two) times daily. 60 tablet 5  ? traMADol (ULTRAM) 50 MG tablet Take 50 mg by mouth every 6 (six) hours as needed.    ? ?No current  facility-administered medications for this visit.  ? ? ?Allergies:   Patient has no known allergies.  ? ?Social History:  The patient  reports that she has never smoked. She has never been exposed to tobacco smoke. She ha

## 2021-06-20 ENCOUNTER — Encounter: Payer: Self-pay | Admitting: Physician Assistant

## 2021-06-20 ENCOUNTER — Ambulatory Visit: Payer: PPO | Admitting: Physician Assistant

## 2021-06-20 VITALS — BP 136/60 | HR 90 | Ht 65.0 in | Wt 111.0 lb

## 2021-06-20 DIAGNOSIS — Z950 Presence of cardiac pacemaker: Secondary | ICD-10-CM

## 2021-06-20 DIAGNOSIS — I10 Essential (primary) hypertension: Secondary | ICD-10-CM | POA: Diagnosis not present

## 2021-06-20 DIAGNOSIS — Z79899 Other long term (current) drug therapy: Secondary | ICD-10-CM | POA: Diagnosis not present

## 2021-06-20 DIAGNOSIS — I5032 Chronic diastolic (congestive) heart failure: Secondary | ICD-10-CM | POA: Diagnosis not present

## 2021-06-20 DIAGNOSIS — I5033 Acute on chronic diastolic (congestive) heart failure: Secondary | ICD-10-CM | POA: Diagnosis not present

## 2021-06-20 LAB — CUP PACEART INCLINIC DEVICE CHECK
Battery Remaining Longevity: 52 mo
Battery Voltage: 2.99 V
Brady Statistic AP VP Percent: 1.35 %
Brady Statistic AP VS Percent: 0.29 %
Brady Statistic AS VP Percent: 70.03 %
Brady Statistic AS VS Percent: 28.34 %
Brady Statistic RA Percent Paced: 1.62 %
Brady Statistic RV Percent Paced: 74.35 %
Date Time Interrogation Session: 20230407165637
Implantable Lead Implant Date: 20171208
Implantable Lead Implant Date: 20171208
Implantable Lead Location: 753859
Implantable Lead Location: 753860
Implantable Lead Model: 5076
Implantable Lead Model: 5076
Implantable Pulse Generator Implant Date: 20171208
Lead Channel Impedance Value: 342 Ohm
Lead Channel Impedance Value: 361 Ohm
Lead Channel Impedance Value: 437 Ohm
Lead Channel Impedance Value: 475 Ohm
Lead Channel Pacing Threshold Amplitude: 0.5 V
Lead Channel Pacing Threshold Amplitude: 0.625 V
Lead Channel Pacing Threshold Pulse Width: 0.4 ms
Lead Channel Pacing Threshold Pulse Width: 0.4 ms
Lead Channel Sensing Intrinsic Amplitude: 1.125 mV
Lead Channel Sensing Intrinsic Amplitude: 15.25 mV
Lead Channel Sensing Intrinsic Amplitude: 2.25 mV
Lead Channel Sensing Intrinsic Amplitude: 20.625 mV
Lead Channel Setting Pacing Amplitude: 2 V
Lead Channel Setting Pacing Amplitude: 2.5 V
Lead Channel Setting Pacing Pulse Width: 0.4 ms
Lead Channel Setting Sensing Sensitivity: 4 mV

## 2021-06-20 MED ORDER — POTASSIUM CHLORIDE ER 10 MEQ PO TBCR: 10.0000 meq | EXTENDED_RELEASE_TABLET | Freq: Every day | ORAL | 3 refills | Status: DC

## 2021-06-20 NOTE — Patient Instructions (Addendum)
Medication Instructions:  ? ?START TAKING POTASSIUM 10 MEQ ONCE  A  DAY  ? ?FOR FIVE DAYS ONLY: ? ?TAKE FUROSEMIDE (LASIX) 40 MG TWICE A DAY  ? ?THEN: RESUME  BACK TO 40 MG ONCE A DAY  ? ? ?*If you need a refill on your cardiac medications before your next appointment, please call your pharmacy* ? ? ?Lab Work:  RETURN FOR LABS IN 2 WEEKS: BMET  ? ? ? ?If you have labs (blood work) drawn today and your tests are completely normal, you will receive your results only by: ?MyChart Message (if you have MyChart) OR ?A paper copy in the mail ?If you have any lab test that is abnormal or we need to change your treatment, we will call you to review the results. ? ? ? ?Testing/Procedures: NONE ORDERED  TODAY ? ? ? ?Follow-Up: ?At [facility], you and your health needs are our priority.  As part of our continuing mission to provide you with exceptional heart care, we have created designated Provider Care Teams.  These Care Teams include your primary Cardiologist (physician) and Advanced Practice Providers (APPs -  Physician Assistants and Nurse Practitioners) who all work together to provide you with the care you need, when you need it. ? ?We recommend signing up for the patient portal called "MyChart".  Sign up information is provided on this After Visit Summary.  MyChart is used to connect with patients for Virtual Visits (Telemedicine).  Patients are able to view lab/test results, encounter notes, upcoming appointments, etc.  Non-urgent messages can be sent to your provider as well.   ?To learn more about what you can do with MyChart, go to [email or web address].   ? ?Your next appointment:   ?6-8 week(s) ? ?The format for your next appointment:   ?In Person ? ?Provider:  the following Advanced Practice Providers on your designated Care Team: Tommye Standard, [place] ? ? ? ?Other Instructions: ? ?

## 2021-06-25 ENCOUNTER — Telehealth: Payer: Self-pay | Admitting: *Deleted

## 2021-06-25 ENCOUNTER — Telehealth: Payer: PPO | Admitting: *Deleted

## 2021-06-25 NOTE — Telephone Encounter (Signed)
?  Care Management  ? ?Follow Up Note ? ? ?06/25/2021 ? ?Name: Mandy Durham MRN: [id number] DOB: Jun 02, 1945 ? ?Referred By: Colon Branch, MD ? ?Reason for Referral:  Chronic Care Management Needs in Patient with Hypertension, Vascular Parkinsonism, Traumatic Brain Injury, Subarachnoid Hemorrhage, Skull Fracture, Dizziness and Giddiness, Anxiety, and Chronic Fatigue. ? ?A second unsuccessful telephone outreach was attempted today. The patient was referred to the case management team for assistance with care management and care coordination. HIPAA compliant messages were left on voicemail, providing contact information, encouraging patient to return LCSW's call at her earliest convenience.  LCSW will make a third follow-up telephone outreach call attempt within the next 5-7 business days, if a return call is not received from patient in the meantime. ? ?Follow-Up Plan:  Request placed with Scheduling Care Guide to reschedule patient's follow-up telephone outreach call with LCSW. ? ?Nat Christen LCSW ?Licensed Clinical Social Worker ?[place] ?[phone]  ?

## 2021-06-30 ENCOUNTER — Telehealth: Payer: Self-pay | Admitting: *Deleted

## 2021-06-30 ENCOUNTER — Ambulatory Visit: Payer: PPO | Admitting: Pulmonary Disease

## 2021-06-30 ENCOUNTER — Encounter: Payer: Self-pay | Admitting: Pulmonary Disease

## 2021-06-30 VITALS — BP 118/68 | HR 88 | Temp 98.5°F | Ht 65.0 in | Wt 109.0 lb

## 2021-06-30 DIAGNOSIS — R0609 Other forms of dyspnea: Secondary | ICD-10-CM

## 2021-06-30 NOTE — Patient Instructions (Signed)
Nice to see you. ? ?Try Trelegy 2 puffs twice a day every day.  Rinse mouth out with water after every use.  I provided samples for 2 weeks.  If this is helpful please call our office to let me know and I will prescribe it.  This is similar but stronger to the Symbicort we tried in the past without much benefit. ? ?The intermittent nature of the shortness of breath is a bit unusual.  It makes me think of asthma.  There is not much else in the lungs that would be intermittent.  Mostly it is chronic or not intermittent upward down if there are other issues in the lungs. ? ?This is why we are trying a stronger inhaler above.  If this does not help.  I think it is unlikely that there is any contribution to your symptoms from the lungs.  Your chest imaging in the past is look good.  Your recent lung function test are essentially normal.  I do worry a bit about anxiety making everything worse, making your symptoms more severe.  I recommend talking to your primary care provider about this in the coming days. ? ?Return to clinic in 3 months or sooner as needed with Dr. Silas Flood ?

## 2021-06-30 NOTE — Chronic Care Management (AMB) (Signed)
  Care Management   Note  06/30/2021 Name: Mandy Durham MRN: [id number] DOB: 11-29-1945  Mandy Durham is a 76 y.o. year old female who is a primary care patient of Colon Branch, MD and is actively engaged with the care management team. I reached out to Mairyn S Trant by phone today to assist with re-scheduling a follow up visit with the Licensed Clinical Social Worker  Follow up plan: Unsuccessful telephone outreach attempt made. A HIPAA compliant phone message was left for the patient providing contact information and requesting a return call.   Julian Hy, [place] Management  Direct Dial: [phone]

## 2021-07-01 ENCOUNTER — Encounter: Payer: Self-pay | Admitting: Physical Therapy

## 2021-07-01 ENCOUNTER — Ambulatory Visit: Payer: PPO | Admitting: Physical Therapy

## 2021-07-01 DIAGNOSIS — R2681 Unsteadiness on feet: Secondary | ICD-10-CM

## 2021-07-01 DIAGNOSIS — M6281 Muscle weakness (generalized): Secondary | ICD-10-CM

## 2021-07-01 DIAGNOSIS — R293 Abnormal posture: Secondary | ICD-10-CM | POA: Diagnosis not present

## 2021-07-01 DIAGNOSIS — M542 Cervicalgia: Secondary | ICD-10-CM

## 2021-07-01 NOTE — Therapy (Signed)
?OUTPATIENT PHYSICAL THERAPY TREATMENT NOTE ? ? ?Patient Name: Mandy Durham ?MRN: [id number] ?DOB:[birth year], 76 y.o., female ?Today's Date: 07/01/2021 ? ? ?PCP: Colon Branch, MD ?REFERRING PROVIDER: Ward Givens, NP ? ? PT End of Session - 07/01/21 1534   ? ? Visit Number 22   ? Number of Visits 31   ? Date for PT Re-Evaluation 08/29/21   ? Authorization Type [employer]   ? Progress Note Due on Visit 20   ? PT Start Time 1533   ? PT Stop Time 7341   ? PT Time Calculation (min) 40 min   ? Activity Tolerance Patient tolerated treatment well   ? Behavior During Therapy [facility] for tasks assessed/performed   ? ?  ?  ? ?  ? ? ?Past Medical History:  ?Diagnosis Date  ? Anxiety state, unspecified   ? Hyperlipidemia   ? Hypertension   ? Insomnia   ? LBBB (left bundle branch block)   ? LHC in 2002 showed normal coronaries.   ? Osteoarthrosis, unspecified whether generalized or localized, unspecified site   ? Overweight(278.02)   ? Parkinson disease ([place])   ? S/P placement of cardiac pacemaker   ? a. 02/21/16: Medtronic Advisa DR MRI SureScan model A2DR01 (serial number [id number])   ? Serrated adenoma of colon 2007  ? Symptomatic menopausal or female climacteric states   ? Syncope and collapse   ? 11/12: Holter 12/12 with rare PACS, HR range 64-140, average 82, no significant arrhythmias. Echo (1/13): EF 50-55%, mild LVH, septal-lateral dyssynchrony c/w LBBB. 3-week event monitor (1/13): No significant arrhythmia.   ? ?Past Surgical History:  ?Procedure Laterality Date  ? COLONOSCOPY  2007  ? EP IMPLANTABLE DEVICE N/A 02/21/2016  ? Procedure: Pacemaker Implant;  Surgeon: Will Meredith Leeds, MD;  Location: [facility];  Service: Cardiovascular;  Laterality: N/A;  ? POLYPECTOMY    ? ?Patient Active Problem List  ? Diagnosis Date Noted  ? Neuropathy involving both lower extremities 06/09/2021  ? Complaints of weakness of lower extremity 06/09/2021  ? Abnormal brain MRI 06/09/2021  ? Neck muscle weakness  06/02/2021  ? Diastolic CHF ([place]) 93/79/0240  ? Kyphosis due to degeneration of spine 05/05/2021  ? Drug-induced orofacial dyskinesia 05/05/2021  ? Hyperkinesis 05/05/2021  ? Traumatic intracerebral hemorrhage with loss of consciousness of 31 minutes to 59 minutes (HCC) 05/05/2021  ? Stammering and stuttering 05/05/2021  ? 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[place]) 09/07/2018  ? Functional dyspepsia 09/08/2017  ? Slow transit constipation 09/08/2017  ? S/P placement of cardiac pacemaker   ? Mobitz II 02/20/2016  ? Nystagmus, end-position 02/12/2016  ? Dizziness and giddiness 02/12/2016  ? Flapping tremor 02/12/2016  ? Chronic fatigue 10/28/2015  ? PCP NOTES >>>> 02/13/2015  ? TBI (traumatic brain injury) ([place]) 01/28/2015  ? Essential hypertension   ? Acute post-traumatic headache, not intractable   ? Vertigo due to brain injury [facility])   ? SAH (subarachnoid hemorrhage) ([place]) 01/21/2015  ? Annual physical exam 09/07/2014  ? Orthostatic hypotension 08/30/2014  ? Acromioclavicular arthrosis 03/21/2014  ? Allergic rhinitis 02/27/2014  ? SI (sacroiliac) joint dysfunction 02/02/2014  ? Gastrocnemius strain, left 12/11/2013  ? Back pain 08/22/2013  ? LBBB (left bundle branch block) 05/13/2013  ? Shingles outbreak 12/15/2012  ? Borderline diabetes   ? Insomnia 01/12/2008  ? HLD (hyperlipidemia) 02/04/2007  ? Anxiety 10/12/2006  ? Osteoarthritis 10/12/2006  ? ? ?REFERRING DIAG: PD, neck pain ? ?THERAPY DIAG:  ?Abnormal posture ? ?Muscle weakness (  generalized) ? ?Cervicalgia ? ?Unsteadiness on feet ? ?PERTINENT HISTORY: Parkinsonism, anxiety, HLD, HTN, pacemaker, hx of TBI (2016) ? ?Per neurologist's note on 06/09/21: DAT scan abnormal - we treat as PD.  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  ? ?PRECAUTIONS: Fall  ? ?SUBJECTIVE: No changes, has not had the chance to call Dr Doristine Devoid office to make an appt for a 2nd opinion. No falls.  ? ?PAIN:  ?Are  you having pain? Yes ?NPRS scale: 4-5/10 ?Pain location: back of the neck  ?Pain orientation: Posterior  ?PAIN TYPE: tightness and sore ?Aggravating factors: nothing ?Relieving factors: ibuprofen  ? ?There were no vitals filed for this visit. ? ? ? ?TODAY'S TREATMENT:   ? ? [facility] PT Assessment - 07/01/21 0001   ? ?  ? AROM  ? Cervical Extension 15   ?  ? High Level Balance  ? High Level Balance Comments push and release test in posterior direction: ~5 steps and therapist needing to assist for balance   ? ?  ?  ? ?  ? ? ?NMR:  ? ?Pt performs PWR! Moves in modified quadruped at elevated mat table:  ?  ?PWR! Up for improved posture (gentle push up and pressing away from mat table) - use of mirror ahead as visual cue for trunk and cervical extension, cues to try to get body in midline and pt with incr lean to R (pt did well w/ visual cue) x2 reps and then x3 reps, pt needing a seated rest break in between and after due to fatigue.  ? ?PWR! Rock for improved weightshifting x10 reps - cues for looking straight ahead when weight shifting forwards for cervical extension, holding in posterior direction for 10 seconds for an additional hamstring stretch.  ? ? ? ? ?With BUE support: alternating posterior stepping strategy x8 reps each side, cues for step length and weight shift.  ? ?With single UE support: alternating lateral stepping strategy w/ reach x6 reps each side, cues to open up hands and to perform head rotation to look at hands. Cues for weight shift and incr foot clearance.  ? ?Seated PWR Rock (reviewed form HEP): x5 reps each side, cues for open hands and looking up at hands.  ? ? ?PATIENT EDUCATION: ?Education details: Asking one of her daughters to come to therapy for improved carryover of HEP for home.  ? ?Person educated: Patient ?Education method: Explanation ?Education comprehension: verbalized understanding ? ? ?HOME EXERCISE PROGRAM: ?Access Code: [email or web address] ?URL: [email or web address] ?Date:  06/06/2021 ?Prepared by: Misty Stanley ? ?Exercises ?- Sidelying Neck Sidebending  - 1 x daily - 7 x weekly - 2 sets - 10 reps ?- Supine Bridge  - 1 x daily - 7 x weekly - 1 sets - 10 reps ?- Isometric Cervical Extension at Wall with Ball  - 1 x daily - 7 x weekly - 2 sets - 5 reps - 10 second hold ?- Scapular Retraction with Resistance  - 1 x daily - 7 x weekly - 2 sets - 5 reps ?- Seated Shoulder Horizontal Abduction with Resistance - Palms Down  - 1 x daily - 7 x weekly - 2 sets - 5 reps ?- Sternocleidomastoid Stretch  - 1 x daily - 7 x weekly - 2 sets - 20 hold ? ?And seated PWR Up/Rock ? ?  ? ? ?  ? ?SHORT TERM GOALS: ALL STGS DUE 06/30/21 ? ?  Pt will finish further assessment of miniBEST with LTG written. ?Baseline: 15/28 ?Target date: 07/04/2021 ?Goal status: MET ? ?2.  Pt will consistently report pain in neck as 2/10 or less in order to demo decr pain for functional mobility/gait. ?Baseline: 4-5/10 on 07/01/21 ?Target date: 07/04/2021 ?Goal status: NOT MET ? ?3.  Pt will recover posterior balance in push and release test in 1 robust step independently, for improved balance recovery  ?Baseline: 2 steps; 5-6 steps and min A from therapist on 07/01/21  ?Target date: 07/04/2021 ?Goal status: NOT MET ? ?4.  Pt will improve cervical extension AROM to at least 15 deg in order to demo improved AROM for functional mobility.  ?Baseline: 15 deg ?Target date: 07/04/2021 ?Goal status: MET ? ?   ? ?LONG TERM GOALS:(updated for re-cert) ALL LTGS DUE 8/54/62 ? ?Pt will be independent in final HEP with husband supervision in order to build upon functional gains made in therapy. ?Baseline: Pt's husband has not come back into session, therapist needing to review exercises with pt occasionally  ?Target date: 08/01/2021 ?Goal status: INITIAL ? ?2.  Pt will improve miniBEST to at least a 19/28 in order to demo decr fall risk. ? ?Baseline: 15/28 ?Target date: 08/01/2021 ?Goal status: REVISED ? ?3.  Pt will improve cervical AROM of R rotation  to 45 degrees and L rotation to 45 degrees in order to demo improved functional mobility.  ?Baseline: R: 40 deg, L 40 deg on 06/02/21 ?Target date: 07/04/2021 ?Goal status: INITIAL ? ?4.  Pt will ambulate at leas

## 2021-07-01 NOTE — Progress Notes (Signed)
? ?'@Patient'$  ID: Mandy Durham, female    DOB: 1945-03-22, 76 y.o.   MRN: [id number] ? ?Chief Complaint  ?Patient presents with  ? Follow-up  ?  Pt is here for 2 month follow up. Pt states she has not changed since last visit. She states that some days she still have SOB when she walks around.   ? ? ?Referring provider: ?Colon Branch, MD ? ?HPI:  ? ?76 y.o. whom we are seeing in follow up for evaluation of dyspnea on exertion.  ? ?Doing ok.  Dyspnea has seemed to improve at last office visit.  We stop Symbicort she does not think it is very helpful.  Now she think dyspnea is little worse.  Seems intermittent.  Not consistently or reliably reproducible.  Discussed that this is the rationale for using ICS/LABA therapy as asthma can be intermittent however this was not beneficial.  She does appear a bit anxious and we discussed how her mood was.  She does endorse increasing anxiety over the last 2 or 3 months.  She has a lot on her plate with her tremor etc.  Possible parkinsonism.  Discussed that anxiety control would be important to treating her underlying symptoms of dyspnea.  Discussed the rationale for trying a stronger inhaler given intermittent nation of her dyspnea to see if that helps more than the Symbicort did.  She expressed understanding.  We again reviewed results of her PFTs which showed essentially normal spirometry.  Unlikely to have significant issues in her lungs and contribute to her dyspnea. ? ?HPI at initial visit: ?Patient has worsening dyspnea exertion over the last year to 18 months.  Worse with inclines or stairs.  Present over longer distances on flat surfaces.  No timing during the day when things are better or worse.  No positional changes that make things better or worse.  No seasonal or environmental changes that make things better or worse.  Resting improves.  No other relieving or exacerbating factors. ? ?Reviewed chest imaging recent chest x-ray 10/06/2019 which on my interpretation  reveals clear lungs.  CT chest coronary with visible portion lungs clear on my interpretation.  Had TTE 10/2019 that showed dilated LA, diastolic dysfunction, elevated LVEDP. ? ?PMH: Hypertension, tremor*TBI ?Surgical history: Colonoscopy, pacemaker implant ?Family history: Mother with CHF, CAD, diabetes ?Social history: Never smoker, lives in [place] ? ?Questionaires / Pulmonary Flowsheets:  ? ?ACT:  ?   ? View : No data to display.  ?  ?  ?  ? ? ? ?MMRC: ?mMRC Dyspnea Scale mMRC Score  ?07/19/2020 ?11:35 AM 0  ? ? ?[place]:  ?   ? View : No data to display.  ?  ?  ?  ? ? ? ?Tests:  ? ?FENO:  ?No results found for: NITRICOXIDE ? ?PFT: ? ?  Latest Ref Rng & Units 04/14/2021  ?  2:41 PM  ?PFT Results  ?FVC-Pre L 2.26    ?FVC-Predicted Pre % 81    ?FVC-Post L 2.40    ?FVC-Predicted Post % 86    ?Pre FEV1/FVC % % 74    ?Post FEV1/FCV % % 77    ?FEV1-Pre L 1.67    ?FEV1-Predicted Pre % 80    ?FEV1-Post L 1.86    ?Personally viewed, does not meet ATS standards therefore cannot be interpreted accurately but all numbers are normal ?WALK:  ?   ? View : No data to display.  ?  ?  ?  ? ? ? ?  Imaging: ?Personally reviewed and as per EMR discussion this note ?[facility] [place] ? ?Result Date: 06/20/2021 ?Pacemaker check in clinic. Normal device function. Thresholds, sensing, impedances consistent with previous measurements. Device programmed to maximize longevity. No mode switch 2, NSVT. Device programmed at appropriate safety margins. Histogram distribution appropriate for patient activity level. Device programmed to optimize intrinsic conduction. Estimated longevity _4 years__. Patient enrolled in remote follow-up. Patient education completed.  ? ? ?Lab Results: ?Personally reviewed and as per EMR, no anemia ?CBC ?   ?Component Value Date/Time  ? WBC 7.1 06/09/2021 1450  ? WBC 8.8 01/22/2021 1625  ? RBC 4.62 06/09/2021 1450  ? RBC 4.35 01/22/2021 1625  ? HGB 13.2 06/09/2021 1450  ? HCT 40.5 06/09/2021 1450  ? PLT  293 06/09/2021 1450  ? MCV 88 06/09/2021 1450  ? MCH 28.6 06/09/2021 1450  ? MCH 28.8 06/07/2020 1030  ? MCHC 32.6 06/09/2021 1450  ? MCHC 32.8 01/22/2021 1625  ? RDW 12.7 06/09/2021 1450  ? LYMPHSABS 1.8 06/09/2021 1450  ? MONOABS 0.3 01/22/2021 1625  ? EOSABS 0.1 06/09/2021 1450  ? BASOSABS 0.0 06/09/2021 1450  ? ? ?BMET ?   ?Component Value Date/Time  ? NA 138 06/09/2021 1450  ? K 3.9 06/09/2021 1450  ? CL 102 06/09/2021 1450  ? CO2 22 06/09/2021 1450  ? GLUCOSE 116 (H) 06/09/2021 1450  ? GLUCOSE 119 (H) 01/22/2021 1625  ? BUN 15 06/09/2021 1450  ? CREATININE 0.77 06/09/2021 1450  ? CREATININE 0.85 06/07/2020 1030  ? CALCIUM 9.1 06/09/2021 1450  ? GFRNONAA 81 12/04/2019 1540  ? GFRAA 93 12/04/2019 1540  ? ? ?BNP ?No results found for: BNP ? ?ProBNP ?   ?Component Value Date/Time  ? PROBNP 724 01/17/2021 1623  ? ? ?Specialty Problems   ? ?  ? Pulmonary Problems  ? Allergic rhinitis  ? ? ?No Known Allergies ? ?Immunization History  ?Administered Date(s) Administered  ? Fluad Quad(high Dose 65+) 12/14/2018, 03/06/2020, 01/22/2021  ? Influenza Split 12/10/2011  ? Influenza Whole 01/17/2007, 12/18/2008  ? Influenza, High Dose Seasonal PF 12/19/2014, 01/13/2016, 12/31/2017, 01/20/2018  ? Influenza,inj,Quad PF,6+ Mos 12/05/2012, 11/14/2013  ? Moderna Sars-Covid-2 Vaccination 04/20/2019, 05/16/2019, 02/09/2020  ? Pneumococcal Conjugate-13 09/07/2014  ? Pneumococcal Polysaccharide-23 11/02/2012  ? Td 06/17/2004  ? Tdap 02/12/2011  ? ? ?Past Medical History:  ?Diagnosis Date  ? Anxiety state, unspecified   ? Hyperlipidemia   ? Hypertension   ? Insomnia   ? LBBB (left bundle branch block)   ? LHC in 2002 showed normal coronaries.   ? Osteoarthrosis, unspecified whether generalized or localized, unspecified site   ? Overweight(278.02)   ? Parkinson disease ([place])   ? S/P placement of cardiac pacemaker   ? a. 02/21/16: Medtronic Advisa DR MRI SureScan model A2DR01 (serial number [id number])   ? Serrated adenoma of colon 2007   ? Symptomatic menopausal or female climacteric states   ? Syncope and collapse   ? 11/12: Holter 12/12 with rare PACS, HR range 64-140, average 82, no significant arrhythmias. Echo (1/13): EF 50-55%, mild LVH, septal-lateral dyssynchrony c/w LBBB. 3-week event monitor (1/13): No significant arrhythmia.   ? ? ?Tobacco History: ?Social History  ? ?Tobacco Use  ?Smoking Status Never  ? Passive exposure: Never  ?Smokeless Tobacco Never  ? ?Counseling given: Not Answered ? ? ?Continue to not smoke ? ?Outpatient Encounter Medications as of 06/30/2021  ?Medication Sig  ? acetaminophen (TYLENOL) 500 MG tablet Take 500  mg by mouth every 6 (six) hours as needed.  ? aspirin EC 81 MG tablet Take 81 mg by mouth daily. Swallow whole.  ? carbidopa-levodopa (SINEMET) 10-100 MG tablet Take 1 tablet by mouth 4 (four) times daily.  ? Cholecalciferol (VITAMIN D PO) Take 1 tablet by mouth 2 (two) times a week. Vitamin D  ? escitalopram (LEXAPRO) 10 MG tablet Take 1 tablet (10 mg total) by mouth daily.  ? ezetimibe (ZETIA) 10 MG tablet Take 1 tablet (10 mg total) by mouth daily.  ? furosemide (LASIX) 20 MG tablet Take 1 tablet (20 mg total) by mouth daily. Alternating 40 mg every other day  ? losartan (COZAAR) 100 MG tablet Take 1 tablet (100 mg total) by mouth daily.  ? nitroGLYCERIN (NITROSTAT) 0.4 MG SL tablet Place 1 tablet (0.4 mg total) under the tongue every 5 (five) minutes x 3 doses as needed for chest pain.  ? potassium chloride (KLOR-CON) 10 MEQ tablet Take 1 tablet (10 mEq total) by mouth daily.  ? rOPINIRole (REQUIP) 0.5 MG tablet Take 1 tablet (0.5 mg total) by mouth 2 (two) times daily.  ? traMADol (ULTRAM) 50 MG tablet Take 50 mg by mouth every 6 (six) hours as needed.  ? ?No facility-administered encounter medications on file as of 06/30/2021.  ? ? ? ?Review of Systems ? ?Review of Systems  ?N/a ? ?Physical Exam ? ?BP 118/68 (BP Location: Left Arm, Patient Position: Sitting, Cuff Size: Normal)   Pulse 88   Temp 98.5 ?F  (36.9 ?C) (Oral)   Ht '5\' 5"'$  (1.651 m)   Wt 109 lb (49.4 kg)   SpO2 99%   BMI 18.14 kg/m?  ? ?Wt Readings from Last 5 Encounters:  ?06/30/21 109 lb (49.4 kg)  ?06/20/21 111 lb (50.3 kg)  ?06/09/21 109 lb 8 oz

## 2021-07-04 ENCOUNTER — Other Ambulatory Visit: Payer: PPO

## 2021-07-04 ENCOUNTER — Ambulatory Visit: Payer: PPO | Admitting: Physical Therapy

## 2021-07-04 DIAGNOSIS — M542 Cervicalgia: Secondary | ICD-10-CM

## 2021-07-04 DIAGNOSIS — R2681 Unsteadiness on feet: Secondary | ICD-10-CM

## 2021-07-04 DIAGNOSIS — M6281 Muscle weakness (generalized): Secondary | ICD-10-CM

## 2021-07-04 DIAGNOSIS — R293 Abnormal posture: Secondary | ICD-10-CM | POA: Diagnosis not present

## 2021-07-04 NOTE — Therapy (Signed)
?OUTPATIENT PHYSICAL THERAPY TREATMENT NOTE ? ? ?Patient Name: Mandy Durham ?MRN: [id number] ?DOB:01-25-1946, 76 y.o., female ?Today's Date: 07/04/2021 ? ? ?PCP: Colon Branch, MD ?REFERRING PROVIDER: Ward Givens, NP ? ? PT End of Session - 07/04/21 1240   ? ? Visit Number 23   ? Number of Visits 31   ? Date for PT Re-Evaluation 08/29/21   ? Authorization Type [employer]   ? Progress Note Due on Visit 20   ? PT Start Time 1238   pt late to session  ? PT Stop Time 6468   ? PT Time Calculation (min) 39 min   ? Activity Tolerance Patient tolerated treatment well   ? Behavior During Therapy [facility] for tasks assessed/performed   ? ?  ?  ? ?  ? ? ?Past Medical History:  ?Diagnosis Date  ? Anxiety state, unspecified   ? Hyperlipidemia   ? Hypertension   ? Insomnia   ? LBBB (left bundle branch block)   ? LHC in 2002 showed normal coronaries.   ? Osteoarthrosis, unspecified whether generalized or localized, unspecified site   ? Overweight(278.02)   ? Parkinson disease ([place])   ? S/P placement of cardiac pacemaker   ? a. 02/21/16: Medtronic Advisa DR MRI SureScan model A2DR01 (serial number [id number])   ? Serrated adenoma of colon 2007  ? Symptomatic menopausal or female climacteric states   ? Syncope and collapse   ? 11/12: Holter 12/12 with rare PACS, HR range 64-140, average 82, no significant arrhythmias. Echo (1/13): EF 50-55%, mild LVH, septal-lateral dyssynchrony c/w LBBB. 3-week event monitor (1/13): No significant arrhythmia.   ? ?Past Surgical History:  ?Procedure Laterality Date  ? COLONOSCOPY  2007  ? EP IMPLANTABLE DEVICE N/A 02/21/2016  ? Procedure: Pacemaker Implant;  Surgeon: Will Meredith Leeds, MD;  Location: [place] [facility];  Service: Cardiovascular;  Laterality: N/A;  ? POLYPECTOMY    ? ?Patient Active Problem List  ? Diagnosis Date Noted  ? Neuropathy involving both lower extremities 06/09/2021  ? Complaints of weakness of lower extremity 06/09/2021  ? Abnormal brain MRI 06/09/2021  ? Neck  muscle weakness 06/02/2021  ? Diastolic CHF ([place]) 06/04/2246  ? Kyphosis due to degeneration of spine 05/05/2021  ? Drug-induced orofacial dyskinesia 05/05/2021  ? Hyperkinesis 05/05/2021  ? Traumatic intracerebral hemorrhage with loss of consciousness of 31 minutes to 59 minutes (HCC) 05/05/2021  ? Stammering and stuttering 05/05/2021  ? 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 Functional dyspepsia 09/08/2017  ? Slow transit constipation 09/08/2017  ? S/P placement of cardiac pacemaker   ? Mobitz II 02/20/2016  ? Nystagmus, end-position 02/12/2016  ? Dizziness and giddiness 02/12/2016  ? Flapping tremor 02/12/2016  ? Chronic fatigue 10/28/2015  ? PCP NOTES >>>> 02/13/2015  ? TBI (traumatic brain injury) ([place]) 01/28/2015  ? Essential hypertension   ? Acute post-traumatic headache, not intractable   ? Vertigo due to brain injury [facility])   ? SAH (subarachnoid hemorrhage) ([place]) 01/21/2015  ? Annual physical exam 09/07/2014  ? Orthostatic hypotension 08/30/2014  ? Acromioclavicular arthrosis 03/21/2014  ? Allergic rhinitis 02/27/2014  ? SI (sacroiliac) joint dysfunction 02/02/2014  ? Gastrocnemius strain, left 12/11/2013  ? Back pain 08/22/2013  ? LBBB (left bundle branch block) 05/13/2013  ? Shingles outbreak 12/15/2012  ? Borderline diabetes   ? Insomnia 01/12/2008  ? HLD (hyperlipidemia) 02/04/2007  ? Anxiety 10/12/2006  ? Osteoarthritis 10/12/2006  ? ? ?REFERRING DIAG: PD, neck pain ? ?THERAPY DIAG:  ?  Abnormal posture ? ?Cervicalgia ? ?Muscle weakness (generalized) ? ?Unsteadiness on feet ? ?PERTINENT HISTORY: Parkinsonism, anxiety, HLD, HTN, pacemaker, hx of TBI (2016) ? ?Per neurologist's note on 06/09/21: DAT scan abnormal - we treat as PD.  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  ? ?PRECAUTIONS: Fall  ? ?SUBJECTIVE: Asking if there are any medications that she can take for her posture. Still has not called Dr. Doristine Devoid  office for a 2nd opinion ?PAIN:  ?Are you having pain? Yes ?NPRS scale: 4-5/10 ?Pain location: back of the neck  ?Pain orientation: Posterior  ?PAIN TYPE: tightness and sore ?Aggravating factors: nothing ?Relieving factors: ibuprofen  ? ?There were no vitals filed for this visit. ? ? ? ?TODAY'S TREATMENT:   ? ? ? ? ?THERAPEUTIC EXERCISE: ?Supine (for gravity to help assist with postural exercises for posterior neck, shoulder, and hip activation): ?5 reps cervical retraction into pillow with 5 second hold ?Combined isometric neck extension with posterior hip extension with bridges 2 sets of 5 reps with 5 second hold, performed an additional x5 reps of bridging with pt performing isometric R lateral neck flexion (cues to press into therapist's hand) ?Horizontal abduction with yellow tband, verbal/demo cues for technique and tactile cues for activation of postural musculature activation x10 reps ? ?Attempted having a physioball on a chair and pt having chest on ball and walking legs out (pt being supported on the ball and had chair pressed against mat table) to modify being prone to help strengthen mid/lower traps (as pt unable to get into prone on mat table). Pt unable to properly get and stay in this position, performed 5 modified prone middle trap UE lifts on the R and that was all pt could tolerate in this position.  ? ?Standing against wall with wide BOS, cues for bringing shoulders back and pressing head back into pillow with 5 second hold; x6 reps total, pt fatigues easily w/ this exercise.  ? ? ?SELF-CARE:  ?Answered pt's questions/providing education regarding the following ?Pt asking if her posture can be managed w/ any medication, provided education about how PT is really the best thing for this ?Pt needing to make appt with Dr. Carles Collet for 2nd opinion (pt has phone number but has not called yet) ?Pt asking PT how to use her inhaler, PT showed pt where to locate the instructions for use in the box for her to go  over at home with her spouse.  ? ? ? ? ? ?PATIENT EDUCATION: ?Education details: Continued to ask if pt can have a family member come into session for improved carryover of exercises at home ?Person educated: Patient ?Education method: Explanation ?Education comprehension: verbalized understanding ? ? ?HOME EXERCISE PROGRAM: ?Access Code: [email or web address] ?URL: [email or web address] ?Date: 06/06/2021 ?Prepared by: Misty Stanley ? ?Exercises ?- Sidelying Neck Sidebending  - 1 x daily - 7 x weekly - 2 sets - 10 reps ?- Supine Bridge  - 1 x daily - 7 x weekly - 1 sets - 10 reps ?- Isometric Cervical Extension at Wall with Ball  - 1 x daily - 7 x weekly - 2 sets - 5 reps - 10 second hold ?- Scapular Retraction with Resistance  - 1 x daily - 7 x weekly - 2 sets - 5 reps ?- Seated Shoulder Horizontal Abduction with Resistance - Palms Down  - 1 x daily - 7 x weekly - 2 sets - 5  reps ?- Sternocleidomastoid Stretch  - 1 x daily - 7 x weekly - 2 sets - 20 hold ? ?And seated PWR Up/Rock ? ?  ? ? ?  ? ?SHORT TERM GOALS: ALL STGS DUE 06/30/21 ? ?Pt will finish further assessment of miniBEST with LTG written. ?Baseline: 15/28 ?Target date: 07/04/2021 ?Goal status: MET ? ?2.  Pt will consistently report pain in neck as 2/10 or less in order to demo decr pain for functional mobility/gait. ?Baseline: 4-5/10 on 07/01/21 ?Target date: 07/04/2021 ?Goal status: NOT MET ? ?3.  Pt will recover posterior balance in push and release test in 1 robust step independently, for improved balance recovery  ?Baseline: 2 steps; 5-6 steps and min A from therapist on 07/01/21  ?Target date: 07/04/2021 ?Goal status: NOT MET ? ?4.  Pt will improve cervical extension AROM to at least 15 deg in order to demo improved AROM for functional mobility.  ?Baseline: 15 deg ?Target date: 07/04/2021 ?Goal status: MET ? ?   ? ?LONG TERM GOALS:(updated for re-cert) ALL LTGS DUE 0/96/43 ? ?Pt will be independent in final HEP with husband supervision in order to build  upon functional gains made in therapy. ?Baseline: Pt's husband has not come back into session, therapist needing to review exercises with pt occasionally  ?Target date: 08/01/2021 ?Goal status: INITIAL ? ?2.

## 2021-07-08 ENCOUNTER — Ambulatory Visit: Payer: PPO | Admitting: Physical Therapy

## 2021-07-08 DIAGNOSIS — R293 Abnormal posture: Secondary | ICD-10-CM

## 2021-07-08 DIAGNOSIS — M542 Cervicalgia: Secondary | ICD-10-CM

## 2021-07-08 DIAGNOSIS — M6281 Muscle weakness (generalized): Secondary | ICD-10-CM

## 2021-07-08 DIAGNOSIS — R2681 Unsteadiness on feet: Secondary | ICD-10-CM

## 2021-07-08 NOTE — Therapy (Signed)
?OUTPATIENT PHYSICAL THERAPY TREATMENT NOTE ? ? ?Patient Name: Mandy Durham ?MRN: [id number] ?DOB:08/05/1945, 76 y.o., female ?Today's Date: 07/08/2021 ? ? ?PCP: Colon Branch, MD ?REFERRING PROVIDER: Ward Givens, NP ? ? PT End of Session - 07/08/21 1412   ? ? Visit Number 24   ? Number of Visits 31   ? Date for PT Re-Evaluation 08/29/21   ? Authorization Type [employer]   ? Progress Note Due on Visit 20   ? PT Start Time 1410   pt late to session  ? PT Stop Time 5110   ? PT Time Calculation (min) 35 min   ? Activity Tolerance Patient tolerated treatment well   ? Behavior During Therapy [facility] for tasks assessed/performed   ? ?  ?  ? ?  ? ? ?Past Medical History:  ?Diagnosis Date  ? Anxiety state, unspecified   ? Hyperlipidemia   ? Hypertension   ? Insomnia   ? LBBB (left bundle branch block)   ? LHC in 2002 showed normal coronaries.   ? Osteoarthrosis, unspecified whether generalized or localized, unspecified site   ? Overweight(278.02)   ? Parkinson disease ([place])   ? S/P placement of cardiac pacemaker   ? a. 02/21/16: Medtronic Advisa DR MRI SureScan model A2DR01 (serial number [id number])   ? Serrated adenoma of colon 2007  ? Symptomatic menopausal or female climacteric states   ? Syncope and collapse   ? 11/12: Holter 12/12 with rare PACS, HR range 64-140, average 82, no significant arrhythmias. Echo (1/13): EF 50-55%, mild LVH, septal-lateral dyssynchrony c/w LBBB. 3-week event monitor (1/13): No significant arrhythmia.   ? ?Past Surgical History:  ?Procedure Laterality Date  ? COLONOSCOPY  2007  ? EP IMPLANTABLE DEVICE N/A 02/21/2016  ? Procedure: Pacemaker Implant;  Surgeon: Will Meredith Leeds, MD;  Location: [place] [facility];  Service: Cardiovascular;  Laterality: N/A;  ? POLYPECTOMY    ? ?Patient Active Problem List  ? Diagnosis Date Noted  ? Neuropathy involving both lower extremities 06/09/2021  ? Complaints of weakness of lower extremity 06/09/2021  ? Abnormal brain MRI 06/09/2021  ? Neck  muscle weakness 06/02/2021  ? Diastolic CHF ([place]) 21/01/7355  ? Kyphosis due to degeneration of spine 05/05/2021  ? Drug-induced orofacial dyskinesia 05/05/2021  ? Hyperkinesis 05/05/2021  ? Traumatic intracerebral hemorrhage with loss of consciousness of 31 minutes to 59 minutes (HCC) 05/05/2021  ? Stammering and stuttering 05/05/2021  ? 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[place]) 09/07/2018  ? Functional dyspepsia 09/08/2017  ? Slow transit constipation 09/08/2017  ? S/P placement of cardiac pacemaker   ? Mobitz II 02/20/2016  ? Nystagmus, end-position 02/12/2016  ? Dizziness and giddiness 02/12/2016  ? Flapping tremor 02/12/2016  ? Chronic fatigue 10/28/2015  ? PCP NOTES >>>> 02/13/2015  ? TBI (traumatic brain injury) ([place]) 01/28/2015  ? Essential hypertension   ? Acute post-traumatic headache, not intractable   ? Vertigo due to brain injury [facility])   ? SAH (subarachnoid hemorrhage) ([place]) 01/21/2015  ? Annual physical exam 09/07/2014  ? Orthostatic hypotension 08/30/2014  ? Acromioclavicular arthrosis 03/21/2014  ? Allergic rhinitis 02/27/2014  ? SI (sacroiliac) joint dysfunction 02/02/2014  ? Gastrocnemius strain, left 12/11/2013  ? Back pain 08/22/2013  ? LBBB (left bundle branch block) 05/13/2013  ? Shingles outbreak 12/15/2012  ? Borderline diabetes   ? Insomnia 01/12/2008  ? HLD (hyperlipidemia) 02/04/2007  ? Anxiety 10/12/2006  ? Osteoarthritis 10/12/2006  ? ? ?REFERRING DIAG: PD, neck pain ? ?THERAPY DIAG:  ?  Abnormal posture ? ?Cervicalgia ? ?Unsteadiness on feet ? ?Muscle weakness (generalized) ? ?PERTINENT HISTORY: Parkinsonism, anxiety, HLD, HTN, pacemaker, hx of TBI (2016) ? ?Per neurologist's note on 06/09/21: DAT scan abnormal - we treat as PD.  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  ? ?PRECAUTIONS: Fall  ? ?SUBJECTIVE: Wants to go over her exercises from her HEP  ? ?PAIN:  ?Are you having pain? Yes ?NPRS scale:  4-5/10 ?Pain location: back of the neck  ?Pain orientation: Posterior  ?PAIN TYPE: tightness and sore ?Aggravating factors: nothing ?Relieving factors: ibuprofen  ? ?There were no vitals filed for this visit. ? ? ? ?TODAY'S TREATMENT:   ? ? ?Pt wished to review entirety of HEP.  ? ?THERAPEUTIC EXERCISE: ?Access Code: [email or web address] ?URL: [email or web address] ?Date: 07/08/2021 ?Prepared by: [name] ? ?Exercises ?- Sidelying Neck Sidebending  - 1 x daily - 7 x weekly - 2 sets - 10 reps - pt performing this one well, with minimal cues  ? ?- Supine Bridge  - 1 x daily - 7 x weekly - 1 sets - 10 reps - additional cues for cervical retraction when performing, limited ROM ? ?- Isometric Cervical Extension at Wall with Ball  - 1 x daily - 7 x weekly - 2 sets - 5 reps - 10 second hold - pt with difficulty simulating this one at home as she does not have a ball and when she used a towel/pillow it fell down behind her. Had pt just stand against wall and palms forward and perform scap retraction, neck extension. Pt able to hold for 10 seconds  ? ?- Scapular Retraction with Resistance  - 1 x daily - 7 x weekly - 2 sets - 5 reps - standing with yellow tband, verbal/demo cues for proper technique  ? ?- Seated Shoulder Horizontal Abduction with Resistance - Palms Down  - 1 x daily - 7 x weekly - 2 sets - 5 reps - initial cues for technique  ? ?- Sternocleidomastoid Stretch  - 1 x daily - 7 x weekly - 2 sets - 20 hold - verbal/demo cues for proper technique  ? ?- Supine Shoulder Horizontal Abduction with Resistance  - 1-2 x daily - 7 x weekly - 2 sets - 5 reps - also added this one in supine in addition to seated for gravity to help assist.  ? ? ? ? ?PATIENT EDUCATION: ?Education details: Reviewed HEP  ?Person educated: Patient ?Education method: Explanation, Demonstration, and Verbal cues ?Education comprehension: verbalized understanding, returned demonstration, and needs further education ? ? ? ?HOME EXERCISE  PROGRAM: ?Access Code: [email or web address] ? ? ?  ? ? ?  ? ?SHORT TERM GOALS: ALL STGS DUE 06/30/21 ? ?Pt will finish further assessment of miniBEST with LTG written. ?Baseline: 15/28 ?Target date: 07/04/2021 ?Goal status: MET ? ?2.  Pt will consistently report pain in neck as 2/10 or less in order to demo decr pain for functional mobility/gait. ?Baseline: 4-5/10 on 07/01/21 ?Target date: 07/04/2021 ?Goal status: NOT MET ? ?3.  Pt will recover posterior balance in push and release test in 1 robust step independently, for improved balance recovery  ?Baseline: 2 steps; 5-6 steps and min A from therapist on 07/01/21  ?Target date: 07/04/2021 ?Goal status: NOT MET ? ?4.  Pt will improve cervical extension AROM to at least 15 deg in order to demo improved AROM for functional mobility.  ?Baseline: 15  deg ?Target date: 07/04/2021 ?Goal status: MET ? ?   ? ?LONG TERM GOALS:(updated for re-cert) ALL LTGS DUE 4/00/05 ? ?Pt will be independent in final HEP with husband supervision in order to build upon functional gains made in therapy. ?Baseline: Pt's husband has not come back into session, therapist needing to review exercises with pt occasionally  ?Target date: 08/01/2021 ?Goal status: INITIAL ? ?2.  Pt will improve miniBEST to at least a 19/28 in order to demo decr fall risk. ? ?Baseline: 15/28 ?Target date: 08/01/2021 ?Goal status: REVISED ? ?3.  Pt will improve cervical AROM of R rotation to 45 degrees and L rotation to 45 degrees in order to demo improved functional mobility.  ?Baseline: R: 40 deg, L 40 deg on 06/02/21 ?Target date: 07/04/2021 ?Goal status: INITIAL ? ?4.  Pt will ambulate at least 200' over indoor and outdoor unlevel surfaces with supervision with no AD vs. LRAD in order to demo improved community mobility.  ? ?Target date: 08/01/2021 ?Goal status: ONGOING ? ?5.  Pt will recover lateral balance in push and release test in 1 robust step independently, for improved balance recovery ?Baseline:  R lateral = unable to ellicit a  step, L lateral =  2 steps ?Target date: 08/01/2021 ?Goal status: INITIAL ? ? ?   ?Plan  ?Clinical Impression Statement Pt late to session today, so had limited time. Pt wishing to review entirety of HEP. PT needing to p

## 2021-07-10 ENCOUNTER — Telehealth: Payer: Self-pay | Admitting: Internal Medicine

## 2021-07-10 ENCOUNTER — Encounter: Payer: PPO | Admitting: Neurology

## 2021-07-10 NOTE — Chronic Care Management (AMB) (Signed)
  Care Management   Note  07/10/2021 Name: Mandy Durham MRN: [id number] DOB: Nov 01, 1945  Mandy Durham is a 76 y.o. year old female who is a primary care patient of Colon Branch, MD and is actively engaged with the care management team. I reached out to Alesia S Sarris by phone today to assist with re-scheduling a follow up visit with the Licensed Clinical Social Worker  Follow up plan: Pt requested a call back on 07/14/2021 to reschedule appt   Julian Hy, [place], [place] Management  Direct Dial: [phone]

## 2021-07-10 NOTE — Telephone Encounter (Signed)
Left message for patient to call back and schedule Medicare Annual Wellness Visit (AWV) in office.  ? ?If not able to come in office, please offer to do virtually or by telephone.  Left office number and my jabber [phone]. ? ?Last AWV:09/07/2014 ? ?Please schedule at anytime with Nurse Health Advisor. ?  ?

## 2021-07-11 ENCOUNTER — Encounter: Payer: Self-pay | Admitting: Physical Therapy

## 2021-07-11 ENCOUNTER — Ambulatory Visit: Payer: PPO | Admitting: Physical Therapy

## 2021-07-11 DIAGNOSIS — R293 Abnormal posture: Secondary | ICD-10-CM | POA: Diagnosis not present

## 2021-07-11 DIAGNOSIS — M6281 Muscle weakness (generalized): Secondary | ICD-10-CM

## 2021-07-11 DIAGNOSIS — M542 Cervicalgia: Secondary | ICD-10-CM

## 2021-07-11 DIAGNOSIS — R2681 Unsteadiness on feet: Secondary | ICD-10-CM

## 2021-07-11 NOTE — Therapy (Signed)
?OUTPATIENT PHYSICAL THERAPY TREATMENT NOTE ? ? ?Patient Name: Mandy Durham ?MRN: [id number] ?DOB:10/23/1945, 76 y.o., female ?Today's Date: 07/11/2021 ? ? ?PCP: Paz, [name] E, MD ?REFERRING PROVIDER: Millikan, Megan, NP ? ? PT End of Session - 07/11/21 1406   ? ? Visit Number 25   ? Number of Visits 31   ? Date for PT Re-Evaluation 08/29/21   ? Authorization Type [employer]   ? Progress Note Due on Visit 20   ? PT Start Time 1404   ? PT Stop Time 1445   ? PT Time Calculation (min) 41 min   ? Activity Tolerance Patient tolerated treatment well   ? Behavior During Therapy WFL for tasks assessed/performed   ? ?  ?  ? ?  ? ? ?Past Medical History:  ?Diagnosis Date  ? Anxiety state, unspecified   ? Hyperlipidemia   ? Hypertension   ? Insomnia   ? LBBB (left bundle branch block)   ? LHC in 2002 showed normal coronaries.   ? Osteoarthrosis, unspecified whether generalized or localized, unspecified site   ? Overweight(278.02)   ? Parkinson disease (HCC)   ? S/P placement of cardiac pacemaker   ? a. 02/21/16: Medtronic Advisa DR MRI SureScan model A2DR01 (serial number [id number])   ? Serrated adenoma of colon 2007  ? Symptomatic menopausal or female climacteric states   ? Syncope and collapse   ? 11/12: Holter 12/12 with rare PACS, HR range 64-140, average 82, no significant arrhythmias. Echo (1/13): EF 50-55%, mild LVH, septal-lateral dyssynchrony c/w LBBB. 3-week event monitor (1/13): No significant arrhythmia.   ? ?Past Surgical History:  ?Procedure Laterality Date  ? COLONOSCOPY  2007  ? EP IMPLANTABLE DEVICE N/A 02/21/2016  ? Procedure: Pacemaker Implant;  Surgeon: Will Martin Camnitz, MD;  Location: [facility];  Service: Cardiovascular;  Laterality: N/A;  ? POLYPECTOMY    ? ?Patient Active Problem List  ? Diagnosis Date Noted  ? Neuropathy involving both lower extremities 06/09/2021  ? Complaints of weakness of lower extremity 06/09/2021  ? Abnormal brain MRI 06/09/2021  ? Neck muscle weakness  06/02/2021  ? Diastolic CHF (HCC) 05/27/2021  ? Kyphosis due to degeneration of spine 05/05/2021  ? Drug-induced orofacial dyskinesia 05/05/2021  ? Hyperkinesis 05/05/2021  ? Traumatic intracerebral hemorrhage with loss of consciousness of 31 minutes to 59 minutes (HCC) 05/05/2021  ? Stammering and stuttering 05/05/2021  ? 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HCC) 09/07/2018  ? Functional dyspepsia 09/08/2017  ? Slow transit constipation 09/08/2017  ? S/P placement of cardiac pacemaker   ? Mobitz II 02/20/2016  ? Nystagmus, end-position 02/12/2016  ? Dizziness and giddiness 02/12/2016  ? Flapping tremor 02/12/2016  ? Chronic fatigue 10/28/2015  ? PCP NOTES >>>> 02/13/2015  ? TBI (traumatic brain injury) (HCC) 01/28/2015  ? Essential hypertension   ? Acute post-traumatic headache, not intractable   ? Vertigo due to brain injury (HCC)   ? SAH (subarachnoid hemorrhage) (HCC) 01/21/2015  ? Annual physical exam 09/07/2014  ? Orthostatic hypotension 08/30/2014  ? Acromioclavicular arthrosis 03/21/2014  ? Allergic rhinitis 02/27/2014  ? SI (sacroiliac) joint dysfunction 02/02/2014  ? Gastrocnemius strain, left 12/11/2013  ? Back pain 08/22/2013  ? LBBB (left bundle branch block) 05/13/2013  ? Shingles outbreak 12/15/2012  ? Borderline diabetes   ? Insomnia 01/12/2008  ? HLD (hyperlipidemia) 02/04/2007  ? Anxiety 10/12/2006  ? Osteoarthritis 10/12/2006  ? ? ?REFERRING DIAG: PD, neck pain ? ?THERAPY DIAG:  ?Abnormal posture ? ?Cervicalgia ? ?  Unsteadiness on feet ? ?Muscle weakness (generalized) ? ?PERTINENT HISTORY: Parkinsonism, anxiety, HLD, HTN, pacemaker, hx of TBI (2016) ? ?Per neurologist's note on 06/09/21: DAT scan abnormal - we treat as PD.  She underwent EMG and NCV with Dr Sater last week, has been diagnosed with left cervical denervation and resulting left shoulder droop.  ? ?PRECAUTIONS: Fall  ? ?SUBJECTIVE: No changes, wants a new printout of her HEP exercises.  ? ?PAIN:  ?Are you having pain? Yes ?NPRS scale: 4-5/10 ?Pain  location: back of the neck  ?Pain orientation: Posterior  ?PAIN TYPE: tightness and sore ?Aggravating factors: nothing ?Relieving factors: ibuprofen  ? ?There were no vitals filed for this visit. ? ? ? ?TODAY'S TREATMENT:   ? ? ?THERAPEUTIC EXERCISE: ? ?Isometric cervical extension at wall for postural strengthening with pressing head into ball while sitting in chair next to wall; ?Performed x10 reps with ball with 5 second holds, cues for full extension. Pt does not have a ball at home, so instead tried with a single pillow, performed an additional x5 reps with pillow. Then performed without anything behind pt's head, with cues to press head gently into the wall, with pt demonstrating improved posture and actually able to bring head back into the wall for cervical extension strengthening and opening through her chest. Holding for 10 seconds. Pt preferring this way better to perform at home. Also changed this exercise from performing in standing to performing in sitting for HEP.  ? ?Isometric cervical lateral flexion into ball at wall x5 reps each side with verbal/tactile cues for technique. Cues first for tall posture before performing with lateral flexion. Pt fatigued easily with this exercise and reverted back to into neck flexion after each rep.  ? ?NMR: ?Pt performs PWR! Moves in standing position; x7 reps each side  ? ?PWR! Rock for improved weighshifting - cues for wider BOS and use of sticky note targets to reach to with cues to look at sticky note with head/eyes and hold for 5 seconds in position, cues for opening hands big when reaching. Pt needing a break halfway through due to fatigue and needed a seated rest break afterwards.  ? ? ? ?PATIENT EDUCATION: ?Education details: Importance and reminded pt of reaching out to Dr. Tat  for a 2nd opinion regarding PD (this was requested by her neurologist, but pt has not been scheduled yet). Written it down for pt on HEP handout as a reminder. Re-printed and  briefly went over exercises per pt's request.  ?Person educated: Patient ?Education method: Explanation, Demonstration, and Verbal cues ?Education comprehension: verbalized understanding, returned demonstration, and needs further education ? ? ? ?HOME EXERCISE PROGRAM: ?Access Code: [email or web address] ? ? ?  ? ? ?  ? ?SHORT TERM GOALS: ALL STGS DUE 06/30/21 ? ?Pt will finish further assessment of miniBEST with LTG written. ?Baseline: 15/28 ?Target date: 07/04/2021 ?Goal status: MET ? ?2.  Pt will consistently report pain in neck as 2/10 or less in order to demo decr pain for functional mobility/gait. ?Baseline: 4-5/10 on 07/01/21 ?Target date: 07/04/2021 ?Goal status: NOT MET ? ?3.  Pt will recover posterior balance in push and release test in 1 robust step independently, for improved balance recovery  ?Baseline: 2 steps; 5-6 steps and min A from therapist on 07/01/21  ?Target date: 07/04/2021 ?Goal status: NOT MET ? ?4.  Pt will improve cervical extension AROM to at least 15 deg in order to demo improved AROM for functional mobility.  ?Baseline: 15 deg ?Target   date: 07/04/2021 ?Goal status: MET ? ?   ? ?LONG TERM GOALS:(updated for re-cert) ALL LTGS DUE 07/28/21 ? ?Pt will be independent in final HEP with husband supervision in order to build upon functional gains made in therapy. ?Baseline: Pt's husband has not come back into session, therapist needing to review exercises with pt occasionally  ?Target date: 08/01/2021 ?Goal status: INITIAL ? ?2.  Pt will improve miniBEST to at least a 19/28 in order to demo decr fall risk. ? ?Baseline: 15/28 ?Target date: 08/01/2021 ?Goal status: REVISED ? ?3.  Pt will improve cervical AROM of R rotation to 45 degrees and L rotation to 45 degrees in order to demo improved functional mobility.  ?Baseline: R: 40 deg, L 40 deg on 06/02/21 ?Target date: 07/04/2021 ?Goal status: INITIAL ? ?4.  Pt will ambulate at least 200' over indoor and outdoor unlevel surfaces with supervision with no AD vs. LRAD in  order to demo improved community mobility.  ? ?Target date: 08/01/2021 ?Goal status: ONGOING ? ?5.  Pt will recover lateral balance in push and release test in 1 robust step independently, for improved balance recovery

## 2021-07-14 ENCOUNTER — Ambulatory Visit: Payer: PPO | Admitting: Physical Therapy

## 2021-07-14 ENCOUNTER — Encounter: Payer: Self-pay | Admitting: Neurology

## 2021-07-14 NOTE — Chronic Care Management (AMB) (Signed)
Returned call today as requested to reschedule with Licensed Clinical SW- no answer, LM to return call

## 2021-07-15 ENCOUNTER — Telehealth: Payer: PPO | Admitting: *Deleted

## 2021-07-15 ENCOUNTER — Telehealth: Payer: Self-pay | Admitting: *Deleted

## 2021-07-15 NOTE — Telephone Encounter (Addendum)
?  Care Management  ? ?Follow Up Note ? ? ?07/15/2021 ? ?Name: Mandy Durham MRN: [id number] DOB: 20-May-1945 ? ?Referred By: Colon Branch, MD ? ?Reason for Referral:  Chronic Care Management Needs in Patient with Hypertension, Vascular Parkinsonism, Traumatic Brain Injury, Subarachnoid Hemorrhage, Skull Fracture, Dizziness and Giddiness, Anxiety, and Chronic Fatigue. ? ?An unsuccessful telephone outreach was attempted today. The patient was referred to the case management team for assistance with care management and care coordination. A HIPAA compliant message was left on voicemail, providing contact information, encouraging patient and/or husband to return LCSW's call at their earliest convenience.  LCSW will make another follow-up telephone outreach call attempt within the next 7-10 business days, if a return call is not received in the meantime. ? ?Follow-Up Plan:  Request placed with Scheduling Care Guide to schedule follow-up telephone outreach call for patient with LCSW. ? ?Nat Christen LCSW ?Licensed Clinical Social Worker ?[place] ?[phone]  ?

## 2021-07-16 ENCOUNTER — Ambulatory Visit: Payer: PPO | Attending: Adult Health | Admitting: Physical Therapy

## 2021-07-16 DIAGNOSIS — M6281 Muscle weakness (generalized): Secondary | ICD-10-CM | POA: Insufficient documentation

## 2021-07-16 DIAGNOSIS — R293 Abnormal posture: Secondary | ICD-10-CM | POA: Insufficient documentation

## 2021-07-16 DIAGNOSIS — R2681 Unsteadiness on feet: Secondary | ICD-10-CM | POA: Insufficient documentation

## 2021-07-16 DIAGNOSIS — M542 Cervicalgia: Secondary | ICD-10-CM | POA: Insufficient documentation

## 2021-07-18 ENCOUNTER — Encounter: Payer: Self-pay | Admitting: Speech Pathology

## 2021-07-18 NOTE — Therapy (Signed)
[facility] ?[address] ?[place], [place], [address] ?Phone: [phone]   Fax:  [phone] ? ?Patient Details  ?Name: Mandy Durham ?MRN: [id number] ?Date of Birth: [birth year] ?Referring Provider:  No ref. provider found ? ?Encounter Date: 07/18/2021 ? ?The above pt had been seen in speech therapy x3. The tx consisted of fluency. Pt never returned for ST services. Unable to assess goals at this time. It was requested that SLP close encounter with pt.  ? ?Verdene Lennert, [place] ?07/18/2021, 11:56 AM ? ?[facility] ?[address] ?[place], [place], [address] ?Phone: [phone]   Fax:  [phone] ?

## 2021-07-21 ENCOUNTER — Ambulatory Visit: Payer: PPO | Admitting: Physical Therapy

## 2021-07-21 ENCOUNTER — Encounter: Payer: Self-pay | Admitting: Physical Therapy

## 2021-07-21 DIAGNOSIS — M542 Cervicalgia: Secondary | ICD-10-CM

## 2021-07-21 DIAGNOSIS — R293 Abnormal posture: Secondary | ICD-10-CM | POA: Diagnosis not present

## 2021-07-21 DIAGNOSIS — M6281 Muscle weakness (generalized): Secondary | ICD-10-CM

## 2021-07-21 DIAGNOSIS — R2681 Unsteadiness on feet: Secondary | ICD-10-CM | POA: Diagnosis not present

## 2021-07-21 NOTE — Therapy (Signed)
?OUTPATIENT PHYSICAL THERAPY TREATMENT NOTE ? ? ?Patient Name: Mandy Durham ?MRN: [id number] ?DOB:Jun 21, 1945, 76 y.o., female ?Today's Date: 07/21/2021 ? ? ?PCP: Colon Branch, MD ?REFERRING PROVIDER: Ward Givens, NP ? ? PT End of Session - 07/21/21 1403   ? ? Visit Number 26   ? Number of Visits 31   ? Date for PT Re-Evaluation 08/29/21   ? Authorization Type [employer]   ? Progress Note Due on Visit 20   ? PT Start Time 1402   ? PT Stop Time 3428   ? PT Time Calculation (min) 41 min   ? Activity Tolerance Patient tolerated treatment well   ? Behavior During Therapy [facility] for tasks assessed/performed   ? ?  ?  ? ?  ? ? ?Past Medical History:  ?Diagnosis Date  ? Anxiety state, unspecified   ? Hyperlipidemia   ? Hypertension   ? Insomnia   ? LBBB (left bundle branch block)   ? LHC in 2002 showed normal coronaries.   ? Osteoarthrosis, unspecified whether generalized or localized, unspecified site   ? Overweight(278.02)   ? Parkinson disease ([place])   ? S/P placement of cardiac pacemaker   ? a. 02/21/16: Medtronic Advisa DR MRI SureScan model A2DR01 (serial number [id number])   ? Serrated adenoma of colon 2007  ? Symptomatic menopausal or female climacteric states   ? Syncope and collapse   ? 11/12: Holter 12/12 with rare PACS, HR range 64-140, average 82, no significant arrhythmias. Echo (1/13): EF 50-55%, mild LVH, septal-lateral dyssynchrony c/w LBBB. 3-week event monitor (1/13): No significant arrhythmia.   ? ?Past Surgical History:  ?Procedure Laterality Date  ? COLONOSCOPY  2007  ? EP IMPLANTABLE DEVICE N/A 02/21/2016  ? Procedure: Pacemaker Implant;  Surgeon: Will Meredith Leeds, MD;  Location: [facility];  Service: Cardiovascular;  Laterality: N/A;  ? POLYPECTOMY    ? ?Patient Active Problem List  ? Diagnosis Date Noted  ? Neuropathy involving both lower extremities 06/09/2021  ? Complaints of weakness of lower extremity 06/09/2021  ? Abnormal brain MRI 06/09/2021  ? Neck muscle weakness  06/02/2021  ? Diastolic CHF ([place]) 76/81/1572  ? Kyphosis due to degeneration of spine 05/05/2021  ? Drug-induced orofacial dyskinesia 05/05/2021  ? Hyperkinesis 05/05/2021  ? Traumatic intracerebral hemorrhage with loss of consciousness of 31 minutes to 59 minutes (HCC) 05/05/2021  ? Stammering and stuttering 05/05/2021  ? 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 Functional dyspepsia 09/08/2017  ? Slow transit constipation 09/08/2017  ? S/P placement of cardiac pacemaker   ? Mobitz II 02/20/2016  ? Nystagmus, end-position 02/12/2016  ? Dizziness and giddiness 02/12/2016  ? Flapping tremor 02/12/2016  ? Chronic fatigue 10/28/2015  ? PCP NOTES >>>> 02/13/2015  ? TBI (traumatic brain injury) ([place]) 01/28/2015  ? Essential hypertension   ? Acute post-traumatic headache, not intractable   ? Vertigo due to brain injury [facility])   ? SAH (subarachnoid hemorrhage) ([place]) 01/21/2015  ? Annual physical exam 09/07/2014  ? Orthostatic hypotension 08/30/2014  ? Acromioclavicular arthrosis 03/21/2014  ? Allergic rhinitis 02/27/2014  ? SI (sacroiliac) joint dysfunction 02/02/2014  ? Gastrocnemius strain, left 12/11/2013  ? Back pain 08/22/2013  ? LBBB (left bundle branch block) 05/13/2013  ? Shingles outbreak 12/15/2012  ? Borderline diabetes   ? Insomnia 01/12/2008  ? HLD (hyperlipidemia) 02/04/2007  ? Anxiety 10/12/2006  ? Osteoarthritis 10/12/2006  ? ? ?REFERRING DIAG: PD, neck pain ? ?THERAPY DIAG:  ?Abnormal posture ? ?Cervicalgia ? ?  Muscle weakness (generalized) ? ?Unsteadiness on feet ? ?PERTINENT HISTORY: Parkinsonism, anxiety, HLD, HTN, pacemaker, hx of TBI (2016) ? ?Per neurologist's note on 06/09/21: DAT scan abnormal - we treat as PD.  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  ? ?PRECAUTIONS: Fall  ? ?SUBJECTIVE: Has been feeling more fatigued and makes it harder to do things at home. Doesn't feel as well rested after she goes to  sleep. Will see Dr. Carles Collet on the 17th.  ? ?PAIN:  ?Are you having pain? Yes ?NPRS scale: 6/10 ?Pain location: back of the neck  ?Pain orientation: Posterior  ?PAIN TYPE: tightness and sore ?Aggravating factors: nothing ?Relieving factors: ibuprofen  ? ?There were no vitals filed for this visit. ? ? ? ?TODAY'S TREATMENT:   ? ? ?THERAPEUTIC EXERCISE: ?Supine on mat table with single pillow to allow gravity to assist with stretch to anterior chest musculature and allow for cervical extension in supine position:  ? ?PT performing L upper trap stretch prior to exercises 3 x 30 seconds, performing with muscle energy technique with cues for pt to gently shrug shoulder up against therapist resistance, performed for an additional 2 x 30 seconds. Pt reporting relief from this stretch.  ? ?Cervical retraction into pillow with x10 reps 5 second isometric holds, performed with head in midline position, initial verbal/tactile cues for proper technique.  ? ?Pt performs PWR! Moves in supine position to allow gravity to assist for posture/trunk rotation  ?  ?PWR! Up for improved posture x10 reps - pt needing multi-modal cues for proper technique, cued to breathe.  ? ?PWR! Twist for improved trunk rotation - attempted to perform with arms extended, however pt reporting incr pain in L shoulder and unable to perform.  ? ?Quadruped cat/cow 2 sets of 5 reps. Took increased time to get into position. Cues for proper alignment. Worked on cervical extension against gravity (pt only able to get to neutral position). Cues for protraction when performing cat by pressing hands into mat table. Pt fatigued easily with exercise.  ? ?Seated PWR Up 2 sets of 5 reps - performed with hands on thighs the whole time to work on posture, with mirror in front of patient as visual cue for cervical extension. Held in position for 5 seconds.  ? ? ? ?PATIENT EDUCATION: ?Education details: Continue with current HEP ?Person educated: Patient ?Education method:  Explanation, Demonstration, and Verbal cues ?Education comprehension: verbalized understanding, returned demonstration, and needs further education ? ? ? ?HOME EXERCISE PROGRAM: ?Access Code: [email or web address] ? ? ?  ? ? ?  ? ?SHORT TERM GOALS: ALL STGS DUE 06/30/21 ? ?Pt will finish further assessment of miniBEST with LTG written. ?Baseline: 15/28 ?Target date: 07/04/2021 ?Goal status: MET ? ?2.  Pt will consistently report pain in neck as 2/10 or less in order to demo decr pain for functional mobility/gait. ?Baseline: 4-5/10 on 07/01/21 ?Target date: 07/04/2021 ?Goal status: NOT MET ? ?3.  Pt will recover posterior balance in push and release test in 1 robust step independently, for improved balance recovery  ?Baseline: 2 steps; 5-6 steps and min A from therapist on 07/01/21  ?Target date: 07/04/2021 ?Goal status: NOT MET ? ?4.  Pt will improve cervical extension AROM to at least 15 deg in order to demo improved AROM for functional mobility.  ?Baseline: 15 deg ?Target date: 07/04/2021 ?Goal status: MET ? ?   ? ?LONG TERM GOALS:(updated for  re-cert) ALL LTGS DUE 08/20/75 ? ?Pt will be independent in final HEP with husband supervision in order to build upon functional gains made in therapy. ?Baseline: Pt's husband has not come back into session, therapist needing to review exercises with pt occasionally  ?Target date: 08/01/2021 ?Goal status: INITIAL ? ?2.  Pt will improve miniBEST to at least a 19/28 in order to demo decr fall risk. ? ?Baseline: 15/28 ?Target date: 08/01/2021 ?Goal status: REVISED ? ?3.  Pt will improve cervical AROM of R rotation to 45 degrees and L rotation to 45 degrees in order to demo improved functional mobility.  ?Baseline: R: 40 deg, L 40 deg on 06/02/21 ?Target date: 07/04/2021 ?Goal status: INITIAL ? ?4.  Pt will ambulate at least 200' over indoor and outdoor unlevel surfaces with supervision with no AD vs. LRAD in order to demo improved community mobility.  ? ?Target date: 08/01/2021 ?Goal status:  ONGOING ? ?5.  Pt will recover lateral balance in push and release test in 1 robust step independently, for improved balance recovery ?Baseline:  R lateral = unable to ellicit a step, L lateral =  2 steps ?Target date: 5/19/

## 2021-07-23 ENCOUNTER — Ambulatory Visit: Payer: PPO | Admitting: Physical Therapy

## 2021-07-23 ENCOUNTER — Encounter: Payer: Self-pay | Admitting: Physical Therapy

## 2021-07-23 DIAGNOSIS — R293 Abnormal posture: Secondary | ICD-10-CM | POA: Diagnosis not present

## 2021-07-23 DIAGNOSIS — R2681 Unsteadiness on feet: Secondary | ICD-10-CM

## 2021-07-23 DIAGNOSIS — M6281 Muscle weakness (generalized): Secondary | ICD-10-CM

## 2021-07-23 DIAGNOSIS — M542 Cervicalgia: Secondary | ICD-10-CM

## 2021-07-23 NOTE — Chronic Care Management (AMB) (Signed)
  Chronic Care Management Note  07/23/2021 Name: Mandy Durham MRN: [id number] DOB: 24-Aug-1945  Mandy Durham is a 76 y.o. year old female who is a primary care patient of Colon Branch, MD and is actively engaged with the care management team. I reached out to Raja S Caradonna by phone today to assist with re-scheduling a follow up visit with the Licensed Clinical Social Worker  Follow up plan: 2nd Unsuccessful telephone outreach attempt made. A HIPAA compliant phone message was left for the patient providing contact information and requesting a return call.   Julian Hy, [place] Management  Direct Dial: [phone]

## 2021-07-23 NOTE — Therapy (Addendum)
?OUTPATIENT PHYSICAL THERAPY TREATMENT NOTE ? ? ?Patient Name: Mandy Durham ?MRN: [id number] ?DOB:March 21, 1945, 76 y.o., female ?Today's Date: 07/24/2021 ? ? ?PCP: Colon Branch, MD ?REFERRING PROVIDER: Ward Givens, NP ? ? PT End of Session - 07/23/21 1452   ? ? Visit Number 27   ? Number of Visits 31   ? Date for PT Re-Evaluation 08/29/21   ? Authorization Type [employer]   ? Progress Note Due on Visit 20   ? PT Start Time 2202   ? PT Stop Time 1529   ? PT Time Calculation (min) 40 min   ? Activity Tolerance Patient tolerated treatment well   ? Behavior During Therapy [facility] for tasks assessed/performed   ? ?  ?  ? ?  ? ? ?Past Medical History:  ?Diagnosis Date  ? Anxiety state, unspecified   ? Hyperlipidemia   ? Hypertension   ? Insomnia   ? LBBB (left bundle branch block)   ? LHC in 2002 showed normal coronaries.   ? Osteoarthrosis, unspecified whether generalized or localized, unspecified site   ? Overweight(278.02)   ? Parkinson disease ([place])   ? S/P placement of cardiac pacemaker   ? a. 02/21/16: Medtronic Advisa DR MRI SureScan model A2DR01 (serial number [id number])   ? Serrated adenoma of colon 2007  ? Symptomatic menopausal or female climacteric states   ? Syncope and collapse   ? 11/12: Holter 12/12 with rare PACS, HR range 64-140, average 82, no significant arrhythmias. Echo (1/13): EF 50-55%, mild LVH, septal-lateral dyssynchrony c/w LBBB. 3-week event monitor (1/13): No significant arrhythmia.   ? ?Past Surgical History:  ?Procedure Laterality Date  ? COLONOSCOPY  2007  ? EP IMPLANTABLE DEVICE N/A 02/21/2016  ? Procedure: Pacemaker Implant;  Surgeon: Will Meredith Leeds, MD;  Location: [facility];  Service: Cardiovascular;  Laterality: N/A;  ? POLYPECTOMY    ? ?Patient Active Problem List  ? Diagnosis Date Noted  ? Neuropathy involving both lower extremities 06/09/2021  ? Complaints of weakness of lower extremity 06/09/2021  ? Abnormal brain MRI 06/09/2021  ? Neck muscle weakness  06/02/2021  ? Diastolic CHF ([place]) 54/27/0623  ? Kyphosis due to degeneration of spine 05/05/2021  ? Drug-induced orofacial dyskinesia 05/05/2021  ? Hyperkinesis 05/05/2021  ? Traumatic intracerebral hemorrhage with loss of consciousness of 31 minutes to 59 minutes (HCC) 05/05/2021  ? Stammering and stuttering 05/05/2021  ? 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 Functional dyspepsia 09/08/2017  ? Slow transit constipation 09/08/2017  ? S/P placement of cardiac pacemaker   ? Mobitz II 02/20/2016  ? Nystagmus, end-position 02/12/2016  ? Dizziness and giddiness 02/12/2016  ? Flapping tremor 02/12/2016  ? Chronic fatigue 10/28/2015  ? PCP NOTES >>>> 02/13/2015  ? TBI (traumatic brain injury) ([place]) 01/28/2015  ? Essential hypertension   ? Acute post-traumatic headache, not intractable   ? Vertigo due to brain injury [facility])   ? SAH (subarachnoid hemorrhage) ([place]) 01/21/2015  ? Annual physical exam 09/07/2014  ? Orthostatic hypotension 08/30/2014  ? Acromioclavicular arthrosis 03/21/2014  ? Allergic rhinitis 02/27/2014  ? SI (sacroiliac) joint dysfunction 02/02/2014  ? Gastrocnemius strain, left 12/11/2013  ? Back pain 08/22/2013  ? LBBB (left bundle branch block) 05/13/2013  ? Shingles outbreak 12/15/2012  ? Borderline diabetes   ? Insomnia 01/12/2008  ? HLD (hyperlipidemia) 02/04/2007  ? Anxiety 10/12/2006  ? Osteoarthritis 10/12/2006  ? ? ?REFERRING DIAG: PD, neck pain ? ?THERAPY DIAG:  ?Abnormal posture ? ?Cervicalgia ? ?  Muscle weakness (generalized) ? ?Unsteadiness on feet ? ?PERTINENT HISTORY: Parkinsonism, anxiety, HLD, HTN, pacemaker, hx of TBI (2016) ? ?Per neurologist's note on 06/09/21: DAT scan abnormal - we treat as PD.  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  ? ?PRECAUTIONS: Fall  ? ?SUBJECTIVE: Made an appt with her PCP on Friday due to continued neck/back pain and also having stomach pain. Has been taking  ibuprofen. Reports she has some pain medicine from Dr. Saintclair Halsted but reports that does not help. Will be seeing Dr. Saintclair Halsted in a couple weeks. Reports it is harder to do the exercises at home. Reports shoulder is feeling better today. Pt wants to work on exercises laying down.  ? ?PAIN:  ?Are you having pain? Yes ?NPRS scale: 7/10 ?Pain location: back of the neck and her stomach ?Pain orientation: Posterior  ?PAIN TYPE: tightness and sore ?Aggravating factors: nothing ?Relieving factors: ibuprofen, laying down flat.  ? ?There were no vitals filed for this visit. ? ? ? ?TODAY'S TREATMENT:   ? ? ?THERAPEUTIC EXERCISE: ? ?Supine on mat table with single pillow and towel roll vertically down pt's spine to allow gravity to assist with stretch to anterior chest/neck musculature and allow for cervical extension in supine position:  ? ?Attempted to perform supine horizontal shoulder ABDuction with red t band for postural strengthening/chest opening. Pt unable to grab the band today to perform due to incr L shoulder pain (this is a new issue). This exercise was added to pt's HEP a couple visits ago, but pt reports that she has not tried it laying down. Attempted without use of band, but pt could still not get into position. ? ?Open book exercises -first performed with pt in L sidelying and opening RUE across body for trunk rotation and stretching chest, performed x7 reps with adding in cues for pt to look eyes and turn head at hand when opening, pt challenged by this and performed with less ROM. Then performed in R sidelying and opening up with L side, pt with less ROM on this side and unable to perform with looking with head/eyes, pt only able to perform 3 reps on this side as unable to tolerate due to L shoulder pain.  ? ?Pt performing cervical rotation x5 reps each side to pt's tolerance and then holding at end range for 10 seconds while therapist performed shoulder depression for an additional stretch. Side bending stretch 3 x  30 seconds each side, incr tightness to the L, but able to improve ROM with each rep.  ? ?Cervical retraction into pillow with x10 reps 5 second isometric holds, performed with head in midline position, initial verbal/tactile cues for proper technique. Pt fatigues easily with these.  ? ? With arms in slight ABD and palms towards the ceiling in supine position, cues to raise chest up to the ceiling for scapular retraction for postural strengthening and anterior chest opening, performed 6 reps. Pt needing verbal/tactile cues throughout. Gentle overpressure from therapist on pt's anterior shoulders for more of a stretch.  ? ?Pt reporting feeling less neck pain at end of session.  ? ? ? ?PATIENT EDUCATION: ?Education details: Continue with current HEP, scheduling an additional 2x week for 4 weeks.  ?Person educated: Patient ?Education method: Explanation, Demonstration, and Verbal cues ?Education comprehension: verbalized understanding, returned demonstration, and needs further education ? ? ? ?HOME EXERCISE PROGRAM: ?Access Code: [email or web address] ? ? ?  ? ? ?  ? ?  SHORT TERM GOALS: ALL STGS DUE 06/30/21 ? ?Pt will finish further assessment of miniBEST with LTG written. ?Baseline: 15/28 ?Target date: 07/04/2021 ?Goal status: MET ? ?2.  Pt will consistently report pain in neck as 2/10 or less in order to demo decr pain for functional mobility/gait. ?Baseline: 4-5/10 on 07/01/21 ?Target date: 07/04/2021 ?Goal status: NOT MET ? ?3.  Pt will recover posterior balance in push and release test in 1 robust step independently, for improved balance recovery  ?Baseline: 2 steps; 5-6 steps and min A from therapist on 07/01/21  ?Target date: 07/04/2021 ?Goal status: NOT MET ? ?4.  Pt will improve cervical extension AROM to at least 15 deg in order to demo improved AROM for functional mobility.  ?Baseline: 15 deg ?Target date: 07/04/2021 ?Goal status: MET ? ?   ? ?LONG TERM GOALS:(updated for re-cert) ALL LTGS DUE 4/53/64 ? ?Pt will be independent  in final HEP with husband supervision in order to build upon functional gains made in therapy. ?Baseline: Pt's husband has not come back into session, therapist needing to review exercises with pt occasionally  ?Tar

## 2021-07-25 ENCOUNTER — Ambulatory Visit: Payer: PPO | Admitting: Internal Medicine

## 2021-07-28 ENCOUNTER — Ambulatory Visit: Payer: PPO | Admitting: Neurology

## 2021-07-28 NOTE — Progress Notes (Signed)
? ? ?Assessment/Plan:  ? ?1.  Parkinsonism ? -Differential between Parkinson's disease and atypical state.  She has fairly significant anterocollis.  This, in combination with diplopia and low-dose dyskinesia does bring up potential MSA, although she is a little bit old for this diagnosis.  We discussed this today and how it differs from Parkinson's disease.  Discussed that treatment is generally very similar although prognosis can differ. ?-DaTscan abnormal in March, 2023 with near absent radiotracer activity in the left and right putamen and decreased radiotracer activity in heads of caudate bilaterally. ?-We will have her stop the carbidopa/levodopa 10/100 and return back to the carbidopa/levodopa 25/100.  It appears in the records that it was changed possibly because of tolerance (pt denies that), but the levodopa dose was left unchanged (active ingredient) and the carbidopa was reduced, which would make the ability to tolerate levodopa less, in theory.  In addition, studies suggest that at least 75 mg of carbidopa per day is needed to get levodopa into the CNS.  She will take carbidopa/levodopa 25/100, 1 at 8am/noon/4pm/8pm (same time as her current medication) ? -she is currently only taking requip 0.5 mg bid.  This is below the starting dose (since taking bid) and I am doubting this is helping very much and she started with visual distortions/halluc after this was added.  I am going d/c that. ? ?2.  Chronic nausea ? -sees GI ? -started prior to the addition of carbidopa/levodopa  ? ?3.  Sialorrhea ? -This is commonly associated with PD.  We talked about treatments.  The patient is not a candidate for oral anticholinergic therapy because of increased risk of confusion and falls.  We discussed Botox (type A and B) and 1% atropine drops.  We discusssed that candy like lemon drops can help by stimulating mm of the oropharynx to induce swallowing. ? ?4.  Diplopia ? -likely part of #1 but will check AchR Ab ? ?5.   Cervical Dystonia ? -I talked to the patient about the nature and pathophysiology.  She has fairly significant anterocollis, chin on chest and head turned to the right as well.  She has very significant limited range of motion.  We talked about treatments.  We talked about the value of botox.  The patient was educated on the botulinum toxin the black blox warning and given a copy of the botox patient medication guide.  The patient understands that this warning states that there have been reported cases of the Botox extending beyond the injection site and creating adverse effects, similar to those of botulism. This included loss of strength, trouble walking, hoarseness, trouble saying words clearly, loss of bladder control, trouble breathing, trouble swallowing, diplopia, blurry vision and ptosis. Most of the distant spread of Botox was happening in patients, primarily children, who received medication for spasticity or for cervical dystonia.  Discussed with patient that I would not want her driving with this degree of dystonia.  Discussed with her that anterocollis tends to be somewhat resistant to treatment.  Discussed with her that Botox for anterocollis can cause weakness of the head/neck muscles.  Discussed, however, that if she wants to drive she may want to try Botox and see if it is successful.  She took literature home and is going to discuss with her family and discuss at follow-up with Dr. Brett Fairy. ? ?6.  GAD ? -Patient asked if the treatments with levodopa and/or Botox would help GAD and how that would be treated.  I told her  that this would not help.  Rather, this would be treated generally with medication and/or counseling, as we would traditionally treat GAD.  She can talk to her primary care physician about this.  She met with my LCSW today as well. ? ? ?7.  She is going to follow-up with Dr. Brett Fairy.  She will follow-up with me on an as-needed basis. ? ? ? ?Subjective:  ? ?Mandy Durham was seen  today in the movement disorders clinic for neurologic consultation at the request of Dohmeier, Asencion Partridge, MD.  The consultation is for the evaluation of possible Parkinson's disease.  Records are reviewed.  She has been following with Dr. Brett Fairy for many years, for various neurologic ailments.  She started seeing Dr. Brett Fairy in 2017 (saw Dr. Jannifer Franklin prior to that for syncope, but those records are not available), following a subarachnoid, intraparenchymal, subdural hemorrhage that she sustained after syncopal episode in 2016 (saw Dr. Saintclair Halsted).  She did complain about tremor on those initial visits and it was felt that she had essential tremor.  She was started on primidone, in addition to metoprolol and topiramate.  She was only able to take primidone, half a tablet at bedtime for 2 nights and she stopped it because of weakness, dizziness, fatigue, nausea (records report chronic nausea, for which she sees GI).  She was subsequently started on carbidopa/levodopa 25/100 and Xanax.  She was not seen from 2017 until 2019, but she was continued on carbidopa/levodopa 25/100, 3 times daily.  There is a note from May 21, 2021 that dosage was changed from carbidopa/levodopa 25/100 to carbidopa/levodopa 10/100 because they wanted to increase the frequency of the medicine to 4 times per day and patient could not tolerate it.  When patient was last seen on June 09, 2021 she was told to continue carbidopa/levodopa 10/100, 4 times per day and ropinirole was added, 0.5 mg twice per day.  She doesn't think that the addition of ropinirole was helpful ? ?DaTscan was ordered and completed on May 28, 2021.  This noted severely decreased radiotracer uptake in the bilateral putamen and significant decrease in the heads of the caudate bilaterally. ? ?Patient had EMG completed by Dr. Felecia Shelling June 02, 2021 and stated that there was chronic C6/C7 radiculopathy and chronic denervation in the trapezius, sternocleidomastoid, mentalis.  No  evidence of acute denervation per records.   ? ?Pts physical therapist did reach out to me yesterday stating that she had severe cervical dystonia and she has addressed with the patient multiple times that she did not think she should be driving, but patient continues. ? ?Current movement d/o meds: ?carbidopa/levodopa 10/100, 1 po qid (8am/12pm/4pm/8pm) - she goes to bed at 11pm ?Requip, 0.5 mg bid - 8am/noon ? ? ?Specific Symptoms:  ?Tremor: Yes.  , in the past but it is gone now.  Used to have it in the L hand, both at rest and with use.  She is R hand dominant ?Family hx of similar:  No. ?Voice: "i'm gruffy" ?Sleep:  ? Vivid Dreams:  Yes.   ? Acting out dreams:  No. ?Wet Pillows: Yes.   ?Postural symptoms:  No. ? Falls?  No. ?Bradykinesia symptoms: difficulty getting out of a chair; denies shuffling; denies daytime sialorrhea; some short steps ?Loss of smell:  Yes.   X years ago ?Loss of taste:  Yes.   - just a little decreased ?Urinary Incontinence:  No. ?Difficulty Swallowing:  No. ?Handwriting, micrographia: Yes.   ?Trouble with ADL's:  No. ?  Trouble buttoning clothing: No. ?Depression:  no but admits to anxiety ?Memory changes:  Yes.  ; husband has always done household finances; she does own pills; she does cooking but mostly they eat out; she does drive ?Hallucinations:  No. ? visual distortions: Yes.   X few months (started after she started requip) ?N/V:  Yes.  , chronic ?Lightheaded:  occasionally ?Diplopia:  Yes.  , sounds like having glasses made with prisms ?Dyskinesia:  she denies ? ? ?The last CT brain that I have is from 2017, demonstrating evidence of right frontotemporal encephalomalacia, due to prior head injury/subdural hematoma.  DaTscan from May 28, 2021 was reviewed.  There was near absent radiotracer uptake bilaterally. ? ?PREVIOUS MEDICATIONS: primidone (she took 25 mg at bedtime for 2 nights and felt weak); topamax; metoprolol; xanax; carbidopa/levodopa; requip, 0.5 mg bid ? ?ALLERGIES:   No Known Allergies ? ?CURRENT MEDICATIONS:  ?Current Meds  ?Medication Sig  ? acetaminophen (TYLENOL) 500 MG tablet Take 500 mg by mouth every 6 (six) hours as needed.  ? aspirin EC 81 MG tablet Take 81 mg

## 2021-07-29 ENCOUNTER — Ambulatory Visit: Payer: PPO | Admitting: Physical Therapy

## 2021-07-29 ENCOUNTER — Encounter: Payer: Self-pay | Admitting: Physical Therapy

## 2021-07-29 DIAGNOSIS — R293 Abnormal posture: Secondary | ICD-10-CM | POA: Diagnosis not present

## 2021-07-29 DIAGNOSIS — M542 Cervicalgia: Secondary | ICD-10-CM

## 2021-07-29 DIAGNOSIS — R2681 Unsteadiness on feet: Secondary | ICD-10-CM

## 2021-07-29 DIAGNOSIS — M6281 Muscle weakness (generalized): Secondary | ICD-10-CM

## 2021-07-29 NOTE — Progress Notes (Deleted)
Cardiology Office Note Date:  07/29/2021  Patient ID:  Mandy Durham, [name] 1945-07-20, MRN [id number] PCP:  Colon Branch, MD  Cardiologist: Dr. Aundra Dubin [phone])  Electrophysiologist:  Dr. Curt Bears    Chief Complaint:   planned follow up  History of Present Illness: Mandy Durham is a 76 y.o. female with history of HTN, HLD, LBBB (since around 2000, remote notes report LHC in 2002 showed normal coronaries), traumatic SAH (2016)advanced heart block w/PPM. She has a stuttering speech pattern and a R hand/forearm tremor she state since the time of her head injury. Vascular Parkinsonism  I saw her 10/06/19, for CP She reports for the last couple months getting SOB easily.  Reports that before she had no difficulties with her ADLs, gives an example of walking to her mail box and back without SOB and now has to catch her breath at the mailbox to walk back, and is SOB once back at the house.  When asked how far a walk it is she reports "not too far" She will get winded with some house chores, not others.  This very unusual for her. When asked about CP, she says no, just very SOB. Discussed if any heaviness, pressure, she says no. She mentions that she told Dr. Curt Bears last time here and he stopped one of her medicines but that did not help.  When I tell her that was end of March she is surprised, states she did not think she had been feeling poorly that long. She is certain and firm that she is not having CP, but the SOB does make her anxious No rest SOB, no dizzy spells, near syncope or syncope  She denies any swelling ,bolating, no symptoms of orthopnea or PND. Noted VP only 2%, planned for an echo and stress test to evaluate her progressive DOE Stress test was low risk with no ischemia or prior infarct Echo looked OK, LVEF 50-55%, Anteroseptal and inferoseptal hypokinesis.  Left ventricular diastolic parameters are consistent with Grade I diastolic dysfunction (impaired relaxation). Elevated left  ventricular end-diastolic pressure. RV OK, no significant VHD She was started on lasix and then uptitrated and planned for follow up f/u BMET was stable  F/u 12/04/19 She feels OK, thinks the lasix has helped, still has some days that she feels a little winded with activities, but all in all better. No CP, palpitations No rest SOB, no symptoms of PND or orthopnea, though wakes a few times to urinate. Since our last visit she has hd once or twice both while seated a very fleeting/sudden dizzy spell that she though she was blacking out.  Over a fast as it started. No changes, labs done, planned to monitor for recurrent symptoms  She has seen Dr. Curt Bears subsequently doing well, no changes made at his visit  More recently saw A. Albertina [place], [place], March 2022 he discussed worsening SOB over the year despiet  and referred to pulmonology given unremarkable cardiac work up.   I saw her 01/17/21 She feels pretty lousy, says her SOB is significant and she can hardly take care of the day to day activities. That being said, she reports that she has felt this way at least 66mo since she saw AJonni Sanger and has been SOB for more then a year. SHe is clear that how she feels today is how she felt in March, not particularly escalating, but none better. She saw pulmonology and reports that she was supposed to be getting a test done, but has  not yet She has had some CP this is random, Infrequent sharp and momentary No near syncope or syncope. She denies symptoms of orthopnea, sometimes she feels SOB seated and standing up seems to help, though her SOB is always the worst when exerting herself, settles after a few minutes No palpitations  Felt to be volume OL,, turned out she was not taking lasix it seemed since originally prescribed, thinking was to use when she was having difficulty urinating She reported her current state of symptoms as ongoing for a year, had not had any acute/recent progression She was advised to  start the lasix taking it daily , updated labs Discussed ER precautions with her  She saw her PMD 01/22/21, discussed her anxiety better with Lexapro, and failure to thrive Described as slightly more frail, was caring for her huband No mention or noted c/o SOB  I saw her 01/31/21 She is a little less SOB but not resolved or to her baseline No CP, no palpitations No near syncope or syncope LOOKED much better, though some volume OL remained Lasix was pulsed again  02/13/21, saw neurology team with increased parkinson symptoms and her Sinemet increased and planned for PT 02/20/21, saw pulmonary, SOB felt multifactorial, with some atopic symptoms trial on  ICS/LABA although not optimistic will be very helpful   I saw her 03/26/21 She again looks better to me, leaning less, breathing comfortably, more conversational then a couple visits ago for sure She reports that she feels SOB and thinks she needs more furosemide. By her observation for the few days on higher furosemide her breathing was better but then at '20mg'$  daily gt more winded again. No CP, no palpitations No near syncope or syncope She is very troubled with the thought she has or may have Parkinson's Her lasix was increased some to alternating days of '40mg'$ /'20mg'$  and f/u labs  3/27: K+ 3.9, Creat 0.77   I saw her 06/20/21 Still w/DOE, not better, not worse No rest SOB NO CP, palpitations No dizzy spells, near syncope or syncope She declined to get an echo done, again pulsed/increased her lasix, planned for labs and f/u  06/30/21 saw pulm, discussed normal spirometry, intermittent SOB/symptoms, ? +/- anxiety Getting PT  *** edema *** weight, volume, SOB *** update echo *** CXR? *** BNP? *** anxiety   Device information MDT dual chamber PPM implanted 02/21/2016  Past Medical History:  Diagnosis Date   Anxiety state, unspecified    Hyperlipidemia    Hypertension    Insomnia    LBBB (left bundle branch block)    LHC in  2002 showed normal coronaries.    Osteoarthrosis, unspecified whether generalized or localized, unspecified site    Overweight(278.02)    Parkinson disease ([place])    S/P placement of cardiac pacemaker    a. 02/21/16: Medtronic Advisa DR MRI SureScan model [id number] (serial number [id number])    Serrated adenoma of colon 2007   Symptomatic menopausal or female climacteric states    Syncope and collapse    11/12: Holter 12/12 with rare PACS, HR range 64-140, average 82, no significant arrhythmias. Echo (1/13): EF 50-55%, mild LVH, septal-lateral dyssynchrony c/w LBBB. 3-week event monitor (1/13): No significant arrhythmia.     Past Surgical History:  Procedure Laterality Date   COLONOSCOPY  2007   EP IMPLANTABLE DEVICE N/A 02/21/2016   Procedure: Pacemaker Implant;  Surgeon: Will Meredith Leeds, MD;  Location: [facility];  Service: Cardiovascular;  Laterality: N/A;  POLYPECTOMY      Current Outpatient Medications  Medication Sig Dispense Refill   acetaminophen (TYLENOL) 500 MG tablet Take 500 mg by mouth every 6 (six) hours as needed.     aspirin EC 81 MG tablet Take 81 mg by mouth daily. Swallow whole.     carbidopa-levodopa (SINEMET) 10-100 MG tablet Take 1 tablet by mouth 4 (four) times daily. 120 tablet 5   Cholecalciferol (VITAMIN D PO) Take 1 tablet by mouth 2 (two) times a week. Vitamin D     escitalopram (LEXAPRO) 10 MG tablet Take 1 tablet (10 mg total) by mouth daily. 30 tablet 6   ezetimibe (ZETIA) 10 MG tablet Take 1 tablet (10 mg total) by mouth daily. 90 tablet 1   furosemide (LASIX) 20 MG tablet Take 1 tablet (20 mg total) by mouth daily. Alternating 40 mg every other day 90 tablet 3   losartan (COZAAR) 100 MG tablet Take 1 tablet (100 mg total) by mouth daily. 30 tablet 3   nitroGLYCERIN (NITROSTAT) 0.4 MG SL tablet Place 1 tablet (0.4 mg total) under the tongue every 5 (five) minutes x 3 doses as needed for chest pain. 25 tablet 1   potassium chloride (KLOR-CON) 10  MEQ tablet Take 1 tablet (10 mEq total) by mouth daily. 90 tablet 3   rOPINIRole (REQUIP) 0.5 MG tablet Take 1 tablet (0.5 mg total) by mouth 2 (two) times daily. 60 tablet 5   traMADol (ULTRAM) 50 MG tablet Take 50 mg by mouth every 6 (six) hours as needed.     No current facility-administered medications for this visit.    Allergies:   Patient has no known allergies.   Social History:  The patient  reports that she has never smoked. She has never been exposed to tobacco smoke. She has never used smokeless tobacco. She reports that she does not drink alcohol and does not use drugs.   Family History:  The patient's family history includes Coronary artery disease in her mother; Dementia in her mother; Diabetes in her mother; Heart failure in her mother.  ROS:  Please see the history of present illness.  All other systems are reviewed and otherwise negative.   PHYSICAL EXAM:  VS:  There were no vitals taken for this visit. BMI: There is no height or weight on file to calculate BMI. Well nourished, well developed, in no acute distress, appears frail, but more sturdy today HEENT: normocephalic, atraumatic  Neck: no JVD, carotid bruits or masses Cardiac: *** RRR; tachycardic, soft SM, no rubs, or gallops Lungs:   *** CTA b/l, no wheezing, rhonchi or rales  Abd: soft, nontender MS: no deformity, thin body habitus, age appropriate atrophy Ext:  *** 1+  edema  Skin: warm and dry, no rash Neuro:   She has a stuttered speech pattern and a resting R hand/forearm tremor, leans to the left today, otherwise no gross deficits appreciated Psych: euthymic mood, full affect  *** PPM site is stable, no tethering or discomfort   EKG:  not done today   PPM interrogation done today and reviewed by myself: ***  10/20/2019: TTE IMPRESSIONS  1. Anteroseptal and inferoseptal hypokinesis. Left ventricular ejection  fraction, by estimation, is 50 to 55%. The left ventricle has low normal  function. The  left ventricle demonstrates regional wall motion  abnormalities (see scoring diagram/findings  for description). Left ventricular diastolic parameters are consistent  with Grade I diastolic dysfunction (impaired relaxation). Elevated left  ventricular end-diastolic pressure.  2. Right ventricular systolic function is normal. The right ventricular  size is normal. There is normal pulmonary artery systolic pressure.   3. Left atrial size was mildly dilated.   4. The mitral valve is normal in structure. Trivial mitral valve  regurgitation. No evidence of mitral stenosis.   5. Tricuspid valve regurgitation is mild to moderate.   6. The aortic valve is tricuspid. Aortic valve regurgitation is not  visualized. No aortic stenosis is present.   7. The inferior vena cava is normal in size with greater than 50%  respiratory variability, suggesting right atrial pressure of 3 mmHg.     10/20/2019; lexiscan stress myoview Nuclear stress EF: 50%. The left ventricular ejection fraction is mildly decreased (45-54%). Visually, the EF appears greater than the 50% computer generated EF There was no ST segment deviation noted during stress. This is a low risk study. There is no evidence of ischemia and no evidence of previous infarction. The study is normal.    TTE 07/17/15  Review of the above records today demonstrates:  - Left ventricle: The cavity size was normal. There was severe   focal basal hypertrophy of the septum. Systolic function was   normal. The estimated ejection fraction was in the range of 50%   to 55%. Anteroseptal hypokinesis. Doppler parameters are   consistent with abnormal left ventricular relaxation (grade 1   diastolic dysfunction). The E/e&' ratio is between 8-15,   suggesting indeterminate LV filling pressure. - Mitral valve: Mildly thickened leaflets . There was trivial   regurgitation. - Left atrium: The atrium was normal in size. - Tricuspid valve: There was mild  regurgitation. - Pulmonary arteries: PA peak pressure: 32 mm Hg (S). - Inferior vena cava: The vessel was normal in size. The   respirophasic diameter changes were in the normal range (>= 50%),   consistent with normal central venous pressure.  Recent Labs: 01/17/2021: NT-Pro BNP 724 06/09/2021: ALT 7; BUN 15; Creatinine, Ser 0.77; Hemoglobin 13.2; Platelets 293; Potassium 3.9; Sodium 138; TSH 1.050  05/26/2021: Cholesterol 200; HDL 108.70; LDL Cholesterol 76; Total CHOL/HDL Ratio 2; Triglycerides 75.0; VLDL 15.0   CrCl cannot be calculated (Patient's most recent lab result is older than the maximum 21 days allowed.).   Wt Readings from Last 3 Encounters:  06/30/21 109 lb (49.4 kg)  06/20/21 111 lb (50.3 kg)  06/09/21 109 lb 8 oz (49.7 kg)     Other studies reviewed: Additional studies/records reviewed today include: summarized above  ASSESSMENT AND PLAN:  1. PPM     *** Intact function     No programming changes amde  2. HTN    *** Looks OK  3. Diastolic dysfunction 4. Acute/chronic CHF     ***   Disposition: as above        Current medicines are reviewed at length with the patient today.  The patient did not have any concerns regarding medicines.  Venetia Night, PA-C 07/29/2021 7:09 AM     [address] [place] [address] [phone] (office)  [phone] (fax)

## 2021-07-29 NOTE — Therapy (Signed)
?OUTPATIENT PHYSICAL THERAPY TREATMENT NOTE ? ? ?Patient Name: Mandy Durham ?MRN: [id number] ?DOB:1945/04/02, 76 y.o., female ?Today's Date: 07/30/2021 ? ? ?PCP: Colon Branch, MD ?REFERRING PROVIDER: Ward Givens, NP ? ? PT End of Session - 07/29/21 1406   ? ? Visit Number 28   ? Number of Visits 31   ? Date for PT Re-Evaluation 08/29/21   ? Authorization Type [employer]   ? Progress Note Due on Visit 20   ? PT Start Time 4098   ? PT Stop Time 1191   ? PT Time Calculation (min) 41 min   ? Activity Tolerance Patient tolerated treatment well   ? Behavior During Therapy [facility] for tasks assessed/performed   ? ?  ?  ? ?  ? ? ?Past Medical History:  ?Diagnosis Date  ? Anxiety state, unspecified   ? Hyperlipidemia   ? Hypertension   ? Insomnia   ? LBBB (left bundle branch block)   ? LHC in 2002 showed normal coronaries.   ? Osteoarthrosis, unspecified whether generalized or localized, unspecified site   ? Overweight(278.02)   ? Parkinson disease ([place])   ? S/P placement of cardiac pacemaker   ? a. 02/21/16: Medtronic Advisa DR MRI SureScan model [id number] (serial number [id number])   ? Serrated adenoma of colon 2007  ? Symptomatic menopausal or female climacteric states   ? Syncope and collapse   ? 11/12: Holter 12/12 with rare PACS, HR range 64-140, average 82, no significant arrhythmias. Echo (1/13): EF 50-55%, mild LVH, septal-lateral dyssynchrony c/w LBBB. 3-week event monitor (1/13): No significant arrhythmia.   ? ?Past Surgical History:  ?Procedure Laterality Date  ? COLONOSCOPY  2007  ? EP IMPLANTABLE DEVICE N/A 02/21/2016  ? Procedure: Pacemaker Implant;  Surgeon: Will Meredith Leeds, MD;  Location: [facility];  Service: Cardiovascular;  Laterality: N/A;  ? POLYPECTOMY    ? ?Patient Active Problem List  ? Diagnosis Date Noted  ? Neuropathy involving both lower extremities 06/09/2021  ? Complaints of weakness of lower extremity 06/09/2021  ? Abnormal brain MRI 06/09/2021  ? Neck muscle weakness  06/02/2021  ? Diastolic CHF ([place]) 47/82/9562  ? Kyphosis due to degeneration of spine 05/05/2021  ? Drug-induced orofacial dyskinesia 05/05/2021  ? Hyperkinesis 05/05/2021  ? Traumatic intracerebral hemorrhage with loss of consciousness of 31 minutes to 59 minutes (HCC) 05/05/2021  ? Stammering and stuttering 05/05/2021  ? 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 Functional dyspepsia 09/08/2017  ? Slow transit constipation 09/08/2017  ? S/P placement of cardiac pacemaker   ? Mobitz II 02/20/2016  ? Nystagmus, end-position 02/12/2016  ? Dizziness and giddiness 02/12/2016  ? Flapping tremor 02/12/2016  ? Chronic fatigue 10/28/2015  ? PCP NOTES >>>> 02/13/2015  ? TBI (traumatic brain injury) ([place]) 01/28/2015  ? Essential hypertension   ? Acute post-traumatic headache, not intractable   ? Vertigo due to brain injury [facility])   ? SAH (subarachnoid hemorrhage) ([place]) 01/21/2015  ? Annual physical exam 09/07/2014  ? Orthostatic hypotension 08/30/2014  ? Acromioclavicular arthrosis 03/21/2014  ? Allergic rhinitis 02/27/2014  ? SI (sacroiliac) joint dysfunction 02/02/2014  ? Gastrocnemius strain, left 12/11/2013  ? Back pain 08/22/2013  ? LBBB (left bundle branch block) 05/13/2013  ? Shingles outbreak 12/15/2012  ? Borderline diabetes   ? Insomnia 01/12/2008  ? HLD (hyperlipidemia) 02/04/2007  ? Anxiety 10/12/2006  ? Osteoarthritis 10/12/2006  ? ? ?REFERRING DIAG: PD, neck pain ? ?THERAPY DIAG:  ?Abnormal posture ? ?Cervicalgia ? ?  Muscle weakness (generalized) ? ?Unsteadiness on feet ? ?PERTINENT HISTORY: Parkinsonism, anxiety, HLD, HTN, pacemaker, hx of TBI (2016) ? ?Per neurologist's note on 06/09/21: DAT scan abnormal - we treat as PD.  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  ? ?PRECAUTIONS: Fall  ? ?SUBJECTIVE: Wants to keep working on her neck pain, walking. Has been doing walking at home about 3 times a day for 10 minutes each.  Reports at times she feels like her neck is better. Usually has to have some medication for the pain to get it to calm down. Sees Dr. Carles Collet tomorrow.  ? ?PAIN:  ?Are you having pain? Yes ?NPRS scale: 3/10 ?Pain location: back of the neck  ?Pain orientation: Posterior  ?PAIN TYPE: tightness and sore ?Aggravating factors: nothing ?Relieving factors: ibuprofen, laying down flat.  ? ?There were no vitals filed for this visit. ? ? ? ?TODAY'S TREATMENT:   ? ? [facility] PT Assessment - 07/29/21 1418   ? ?  ? AROM  ? Cervical - Right Rotation 35   ? Cervical - Left Rotation 45   ?  ? Mini-BESTest  ? Sit To Stand Normal: Comes to stand without use of hands and stabilizes independently.   ? Rise to Toes < 3 s.   ? Stand on one leg (left) Moderate: < 20 s   4 seconds  ? Stand on one leg (right) Moderate: < 20 s   3.8  ? Stand on one leg - lowest score 1   ? Compensatory Stepping Correction - Forward Normal: Recovers independently with a single, large step (second realignement is allowed).   ? Compensatory Stepping Correction - Backward Moderate: More than one step is required to recover equilibrium   2 steps  ? Compensatory Stepping Correction - Left Lateral Moderate: Several steps to recover equilibrium   2 steps  ? Compensatory Stepping Correction - Right Lateral Normal: Recovers independently with 1 step (crossover or lateral OK)   ? Stepping Corredtion Lateral - lowest score 1   ? Stance - Feet together, eyes open, firm surface  Normal: 30s   ? Stance - Feet together, eyes closed, foam surface  Moderate: < 30s   5 seconds  ? Incline - Eyes Closed Moderate: Stands independently < 30s OR aligns with surface   ? Timed UP & GO with Dual Task Moderate: Dual Task affects either counting OR walking (>10%) when compared to the TUG without Dual Task.   ?  ? Timed Up and Go Test  ? Normal TUG (seconds) 8.43   ? Manual TUG (seconds) 8.81   ? Cognitive TUG (seconds) 11.91   counts backwards by 1, unable to do 3  ?  ? High Level Balance  ?  High Level Balance Comments push and release test in posterior direction: 2 steps   ? ?  ?  ? ?  ? ? ? ? ? ?NDI:  ?13/50 = mild disability, 26%  (therapist had to read out to pt the questionnaire)  ? ? ?PATIENT EDUCATION: ?Education details: Results of goals, areas to continue to work on in therapy to address balance/posture/gait, may focus more on balance as pt has HEP for neck/back strengthening and stretching exercises to perform at home and pt's neck pain varies on the day due to cervical dystonia. Continued to ask pt if she could have a family member come to therapy with her to help  with incr carryover for exercises at home.  ?Person educated: Patient ?Education method: Explanation, Demonstration, and Verbal cues ?Education comprehension: verbalized understanding, returned demonstration, and needs further education ? ? ? ?HOME EXERCISE PROGRAM: ?Access Code: [email or web address] ? ? ?  ? ? ?  ? ?SHORT TERM GOALS: ALL STGS DUE 06/30/21 ? ?Pt will finish further assessment of miniBEST with LTG written. ?Baseline: 15/28 ?Target date: 07/04/2021 ?Goal status: MET ? ?2.  Pt will consistently report pain in neck as 2/10 or less in order to demo decr pain for functional mobility/gait. ?Baseline: 4-5/10 on 07/01/21 ?Target date: 07/04/2021 ?Goal status: NOT MET ? ?3.  Pt will recover posterior balance in push and release test in 1 robust step independently, for improved balance recovery  ?Baseline: 2 steps; 5-6 steps and min A from therapist on 07/01/21  ?Target date: 07/04/2021 ?Goal status: NOT MET ? ?4.  Pt will improve cervical extension AROM to at least 15 deg in order to demo improved AROM for functional mobility.  ?Baseline: 15 deg ?Target date: 07/04/2021 ?Goal status: MET ? ?   ? ?LONG TERM GOALS: ALL LTGS DUE 07/28/21 ? ?Pt will be independent in final HEP with husband supervision in order to build upon functional gains made in therapy. ?Baseline: Pt's husband has not come back into session, therapist needing to review exercises  with pt occasionally  ? ?Goal status: ON-GOING ? ?2.  Pt will improve miniBEST to at least a 19/28 in order to demo decr fall risk. ? ?Baseline: 15/28, did not get to finish assessing on 07/29/21 ? ?Goal statu

## 2021-07-30 ENCOUNTER — Telehealth: Payer: Self-pay | Admitting: Neurology

## 2021-07-30 ENCOUNTER — Encounter: Payer: PPO | Admitting: Physician Assistant

## 2021-07-30 ENCOUNTER — Other Ambulatory Visit (INDEPENDENT_AMBULATORY_CARE_PROVIDER_SITE_OTHER): Payer: PPO

## 2021-07-30 ENCOUNTER — Encounter: Payer: Self-pay | Admitting: Neurology

## 2021-07-30 ENCOUNTER — Ambulatory Visit: Payer: PPO | Admitting: Neurology

## 2021-07-30 ENCOUNTER — Telehealth: Payer: Self-pay | Admitting: *Deleted

## 2021-07-30 VITALS — BP 127/72 | HR 83 | Ht 65.0 in | Wt 106.0 lb

## 2021-07-30 DIAGNOSIS — G20 Parkinson's disease: Secondary | ICD-10-CM | POA: Diagnosis not present

## 2021-07-30 DIAGNOSIS — H532 Diplopia: Secondary | ICD-10-CM | POA: Diagnosis not present

## 2021-07-30 DIAGNOSIS — M791 Myalgia, unspecified site: Secondary | ICD-10-CM

## 2021-07-30 DIAGNOSIS — R531 Weakness: Secondary | ICD-10-CM

## 2021-07-30 DIAGNOSIS — K117 Disturbances of salivary secretion: Secondary | ICD-10-CM

## 2021-07-30 LAB — CK: Total CK: 194 U/L — ABNORMAL HIGH (ref 7–177)

## 2021-07-30 MED ORDER — CARBIDOPA-LEVODOPA 25-100 MG PO TABS: 1.0000 | ORAL_TABLET | Freq: Three times a day (TID) | ORAL | 1 refills | Status: DC

## 2021-07-30 MED ORDER — CARBIDOPA-LEVODOPA 25-100 MG PO TABS: ORAL_TABLET | ORAL | 1 refills | Status: DC

## 2021-07-30 NOTE — Telephone Encounter (Signed)
[facility] pharmacy called and LM. They need to know the correct directions for sinemet. You can call [phone] ?

## 2021-07-30 NOTE — Telephone Encounter (Signed)
Correction to 4 times daily made. ?

## 2021-07-30 NOTE — Telephone Encounter (Signed)
?  Care Management  ? ?Follow Up Note ? ? ?07/30/2021 ? ?Name: Mandy Durham MRN: [id number] DOB: February 02, 1946 ? ?Referred By: Colon Branch, MD ? ?Reason for Referral:  Chronic Care Management Needs in Patient with Hypertension, Vascular Parkinsonism, Traumatic Brain Injury, Subarachnoid Hemorrhage, Skull Fracture, Dizziness and Giddiness, Anxiety, and Chronic Fatigue. ? ?An unsuccessful telephone outreach was attempted today. The patient was referred to the case management team for assistance with care management and care coordination. A HIPAA compliant message was left on voicemail, providing contact information, encouraging patient to return LCSW's call at her earliest convenience.  LCSW will make another follow-up telephone outreach call attempt within the next 7-10 business days, if a return call is not received from patient in the meantime. ? ?Follow-Up Plan:  Request placed with Scheduling Care Guide to reschedule patient's follow-up telephone outreach call with LCSW. ? ?Nat Christen LCSW ?Licensed Clinical Social Worker ?[place] ?[phone]  ?

## 2021-07-30 NOTE — Patient Instructions (Signed)
STOP carbidopa/levodopa 10/100 at 8am/12pm/4pm/8pm ?START carbidopa/levodopa 25/100 at 8am/noon/4pm/8pm ?STOP requip/ropinirole due to the visions you have out of the corner of your eye ?We discussed botox for the head dropping/turning  ?We discussed that I don't want you driving due to the head dropping/turning/limited mobility of the head ?We will get some lab work today ? ?Your provider has requested that you have labwork completed today. The lab is located on the Second floor at [place], within the [facility] office. When you get off the elevator, turn right and go in the [facility] [address]; the first brown door on the left.  Tell the ladies behind the desk that you are there for lab work. If you are not called within 15 minutes please check with the front desk.  ? ?Once you complete your labs you are free to go. You will receive a call or message via MyChart with your lab results.   ? ?

## 2021-08-01 ENCOUNTER — Ambulatory Visit: Payer: PPO | Admitting: Internal Medicine

## 2021-08-05 DIAGNOSIS — G959 Disease of spinal cord, unspecified: Secondary | ICD-10-CM | POA: Diagnosis not present

## 2021-08-05 DIAGNOSIS — G20 Parkinson's disease: Secondary | ICD-10-CM | POA: Diagnosis not present

## 2021-08-08 ENCOUNTER — Ambulatory Visit: Payer: PPO | Admitting: Physical Therapy

## 2021-08-09 LAB — MYASTHENIA GRAVIS PANEL 1
A CHR BINDING ABS: <0.3 nmol/L
STRIATED MUSCLE AB SCREEN: NEGATIVE

## 2021-08-12 ENCOUNTER — Encounter: Payer: Self-pay | Admitting: Pulmonary Disease

## 2021-08-12 ENCOUNTER — Ambulatory Visit: Payer: PPO | Admitting: Pulmonary Disease

## 2021-08-12 VITALS — BP 126/64 | HR 88 | Temp 98.4°F | Ht 65.0 in | Wt 108.0 lb

## 2021-08-12 DIAGNOSIS — R0609 Other forms of dyspnea: Secondary | ICD-10-CM

## 2021-08-12 DIAGNOSIS — F419 Anxiety disorder, unspecified: Secondary | ICD-10-CM | POA: Diagnosis not present

## 2021-08-12 MED ORDER — BREZTRI AEROSPHERE 160-9-4.8 MCG/ACT IN AERO: 2.0000 | INHALATION_SPRAY | Freq: Two times a day (BID) | RESPIRATORY_TRACT | 11 refills | Status: DC

## 2021-08-12 NOTE — Patient Instructions (Addendum)
Nice to see you  I am glad the breztri has helped  Continue Breztri 2 puffs twice a day, every day. Rinse mouth out after every use.  Please call if it is too expensive - [phone] - ask for Nurse April or anyone who can help with cost of medication.  Return to clinic in 3 months or sooner as needed with Dr. Silas Flood

## 2021-08-12 NOTE — Progress Notes (Signed)
$'@Patient'j$  ID: Mandy Durham, female    DOB: [birth year], 76 y.o.   MRN: [id number]  Chief Complaint  Patient presents with   Follow-up    Pt states her breathing is much better since last visit. Pt was sent home with [facility] and she states that it did help her.     Referring provider: Colon Branch, MD  HPI:   76 y.o. whom we are seeing in follow up for evaluation of dyspnea on exertion.   Returns for routine follow-up, 4 weeks since last visit.  At that time had worsening dyspnea.  Intermittent, stopping starting.  Felt possible related to reactive airway disease/asthma given the start and stop nature of it versus worsen anxiety symptoms.  Escalated rescue.  She feels like this is helped.  Curiously it sounds like it helps with her mood as well as her breathing.  HPI at initial visit: Patient has worsening dyspnea exertion over the last year to 18 months.  Worse with inclines or stairs.  Present over longer distances on flat surfaces.  No timing during the day when things are better or worse.  No positional changes that make things better or worse.  No seasonal or environmental changes that make things better or worse.  Resting improves.  No other relieving or exacerbating factors.  Reviewed chest imaging recent chest x-ray 10/06/2019 which on my interpretation reveals clear lungs.  CT chest coronary with visible portion lungs clear on my interpretation.  Had TTE 10/2019 that showed dilated LA, diastolic dysfunction, elevated LVEDP.  PMH: Hypertension, tremor*TBI Surgical history: Colonoscopy, pacemaker implant Family history: Mother with CHF, CAD, diabetes Social history: Never smoker, lives in [place] / Pulmonary Flowsheets:   ACT:      View : No data to display.           MMRC: mMRC Dyspnea Scale mMRC Score  07/19/2020 11:35 AM 0    [place]:      View : No data to display.           Tests:   FENO:  No results found for: NITRICOXIDE  PFT:     Latest Ref Rng & Units 04/14/2021    2:41 PM  PFT Results  FVC-Pre L 2.26    FVC-Predicted Pre % 81    FVC-Post L 2.40    FVC-Predicted Post % 86    Pre FEV1/FVC % % 74    Post FEV1/FCV % % 77    FEV1-Pre L 1.67    FEV1-Predicted Pre % 80    FEV1-Post L 1.86    Personally viewed, does not meet ATS standards therefore cannot be interpreted accurately but all numbers are normal WALK:      View : No data to display.           Imaging: Personally reviewed and as per EMR discussion this note No results found.   Lab Results: Personally reviewed and as per EMR, no anemia CBC    Component Value Date/Time   WBC 7.1 06/09/2021 1450   WBC 8.8 01/22/2021 1625   RBC 4.62 06/09/2021 1450   RBC 4.35 01/22/2021 1625   HGB 13.2 06/09/2021 1450   HCT 40.5 06/09/2021 1450   PLT 293 06/09/2021 1450   MCV 88 06/09/2021 1450   MCH 28.6 06/09/2021 1450   MCH 28.8 06/07/2020 1030   MCHC 32.6 06/09/2021 1450   MCHC 32.8 01/22/2021 1625   RDW 12.7 06/09/2021 1450   LYMPHSABS 1.8 06/09/2021 1450  MONOABS 0.3 01/22/2021 1625   EOSABS 0.1 06/09/2021 1450   BASOSABS 0.0 06/09/2021 1450    BMET    Component Value Date/Time   NA 138 06/09/2021 1450   K 3.9 06/09/2021 1450   CL 102 06/09/2021 1450   CO2 22 06/09/2021 1450   GLUCOSE 116 (H) 06/09/2021 1450   GLUCOSE 119 (H) 01/22/2021 1625   BUN 15 06/09/2021 1450   CREATININE 0.77 06/09/2021 1450   CREATININE 0.85 06/07/2020 1030   CALCIUM 9.1 06/09/2021 1450   GFRNONAA 81 12/04/2019 1540   GFRAA 93 12/04/2019 1540    BNP No results found for: BNP  ProBNP    Component Value Date/Time   PROBNP 724 01/17/2021 1623    Specialty Problems       Pulmonary Problems   Allergic rhinitis    No Known Allergies  Immunization History  Administered Date(s) Administered   Fluad Quad(high Dose 65+) 12/14/2018, 03/06/2020, 01/22/2021   Influenza Split 12/10/2011   Influenza Whole 01/17/2007, 12/18/2008   Influenza, High Dose  Seasonal PF 12/19/2014, 01/13/2016, 12/31/2017, 01/20/2018   Influenza,inj,Quad PF,6+ Mos 12/05/2012, 11/14/2013   Moderna Sars-Covid-2 Vaccination 04/20/2019, 05/16/2019, 02/09/2020   Pneumococcal Conjugate-13 09/07/2014   Pneumococcal Polysaccharide-23 11/02/2012   Td 06/17/2004   Tdap 02/12/2011    Past Medical History:  Diagnosis Date   Anxiety state, unspecified    Hyperlipidemia    Hypertension    Insomnia    LBBB (left bundle branch block)    LHC in 2002 showed normal coronaries.    Osteoarthrosis, unspecified whether generalized or localized, unspecified site    Overweight(278.02)    Parkinson disease ([place])    S/P placement of cardiac pacemaker    a. 02/21/16: Medtronic Advisa DR MRI SureScan model Q7YP95 (serial number [id number])    Serrated adenoma of colon 2007   Symptomatic menopausal or female climacteric states    Syncope and collapse    11/12: Holter 12/12 with rare PACS, HR range 64-140, average 82, no significant arrhythmias. Echo (1/13): EF 50-55%, mild LVH, septal-lateral dyssynchrony c/w LBBB. 3-week event monitor (1/13): No significant arrhythmia.     Tobacco History: Social History   Tobacco Use  Smoking Status Never   Passive exposure: Never  Smokeless Tobacco Never   Counseling given: Not Answered   Continue to not smoke  Outpatient Encounter Medications as of 08/12/2021  Medication Sig   acetaminophen (TYLENOL) 500 MG tablet Take 500 mg by mouth every 6 (six) hours as needed.   aspirin EC 81 MG tablet Take 81 mg by mouth daily. Swallow whole.   Budeson-Glycopyrrol-Formoterol (BREZTRI AEROSPHERE) 160-9-4.8 MCG/ACT AERO Inhale 2 puffs into the lungs in the morning and at bedtime.   carbidopa-levodopa (SINEMET IR) 25-100 MG tablet Take 1 tablet by mouth 4 times daily at 8am/noon/4pm/8pm   Cholecalciferol (VITAMIN D PO) Take 1 tablet by mouth 2 (two) times a week. Vitamin D   escitalopram (LEXAPRO) 10 MG tablet Take 1 tablet (10 mg total) by mouth  daily.   ezetimibe (ZETIA) 10 MG tablet Take 1 tablet (10 mg total) by mouth daily.   furosemide (LASIX) 20 MG tablet Take 1 tablet (20 mg total) by mouth daily. Alternating 40 mg every other day   losartan (COZAAR) 100 MG tablet Take 1 tablet (100 mg total) by mouth daily.   nitroGLYCERIN (NITROSTAT) 0.4 MG SL tablet Place 1 tablet (0.4 mg total) under the tongue every 5 (five) minutes x 3 doses as needed for chest pain.  potassium chloride (KLOR-CON) 10 MEQ tablet Take 1 tablet (10 mEq total) by mouth daily.   traMADol (ULTRAM) 50 MG tablet Take 50 mg by mouth every 6 (six) hours as needed.   No facility-administered encounter medications on file as of 08/12/2021.     Review of Systems  Review of Systems  N/a  Physical Exam  BP 126/64 (BP Location: Left Arm, Patient Position: Sitting, Cuff Size: Normal)   Pulse 88   Temp 98.4 F (36.9 C) (Oral)   Ht '5\' 5"'$  (1.651 m)   Wt 108 lb (49 kg)   SpO2 98%   BMI 17.97 kg/m   Wt Readings from Last 5 Encounters:  08/12/21 108 lb (49 kg)  07/30/21 106 lb (48.1 kg)  06/30/21 109 lb (49.4 kg)  06/20/21 111 lb (50.3 kg)  06/09/21 109 lb 8 oz (49.7 kg)    BMI Readings from Last 5 Encounters:  08/12/21 17.97 kg/m  07/30/21 17.64 kg/m  06/30/21 18.14 kg/m  06/20/21 18.47 kg/m  06/09/21 18.22 kg/m     Physical Exam General: Sitting up in chair, in no distress Eyes: EOMI, icterus Neck: Supple, no JVP appreciated Cardiovascular: Irregular, warm Pulmonary: Clear to auscultation bilaterally, no wheezes Abdomen: Nondistended, bowel sounds present MSK: No synovitis, joint effusion Neuro: Normal gait, no weakness, near constant right lower extremity tremor and occasional upper extremity, Psych: Normal mood, full affect   Assessment & Plan:   Dyspnea on exertion: Suspect is multifactorial related somewhat to deconditioning as she has been less active over the course of the pandemic.  Do suspect an element of cardiac component  given her diastolic dysfunction left atrial dilation as well as confirmed elevated LVEDP on TTE 10/2019.  Suspect worsening diastology with exercise contributing to symptoms.  Prior chest imaging clear.  She is never smoker.  Suspect kyphosis leading to extra parenchymal restriction and shortness of breath.  Some atopic symptoms endorsed in the past.  Trial ICS/LABA the Symbicort not helpful so was stopped early 2023.  Recent chest imaging clear.  Some mild worsening over the last couple months.  Suspect some component of anxiety.  However given intermittent nature, escalated ICS/LABA to triple inhaled therapy via Breztri at last visit.  She endorsed improved symptoms.  To continue at this time.  New prescription sent today.  Anxiety: Seems a bit better.  Some hyperventilation on exam.  Curiously it seems like Judithann Sauger is helped a bit with her nerves.  Wonder if this is a placebo effect.  Regardless encouraged her to ongoing follow-up with her PCP for discussion of anxiety symptoms.    Return in about 3 months (around 11/12/2021).   Lanier Clam, MD 08/12/2021

## 2021-08-13 ENCOUNTER — Ambulatory Visit: Payer: PPO | Admitting: Physical Therapy

## 2021-08-13 DIAGNOSIS — M542 Cervicalgia: Secondary | ICD-10-CM

## 2021-08-13 DIAGNOSIS — M6281 Muscle weakness (generalized): Secondary | ICD-10-CM

## 2021-08-13 DIAGNOSIS — R293 Abnormal posture: Secondary | ICD-10-CM | POA: Diagnosis not present

## 2021-08-13 DIAGNOSIS — R2681 Unsteadiness on feet: Secondary | ICD-10-CM

## 2021-08-13 NOTE — Therapy (Signed)
OUTPATIENT PHYSICAL THERAPY TREATMENT NOTE   Patient Name: Mandy Durham MRN: [id number] DOB:[birth year], 76 y.o., female Today's Date: 08/14/2021   PCP: Colon Branch, MD REFERRING PROVIDER: Ward Givens, NP   PT End of Session - 08/13/21 1453     Visit Number 29    Number of Visits 31    Date for PT Re-Evaluation 08/29/21    Authorization Type [employer]    Progress Note Due on Visit 20    PT Start Time 1448    PT Stop Time 1530    PT Time Calculation (min) 42 min    Activity Tolerance Patient tolerated treatment well    Behavior During Therapy WFL for tasks assessed/performed             Past Medical History:  Diagnosis Date   Anxiety state, unspecified    Hyperlipidemia    Hypertension    Insomnia    LBBB (left bundle branch block)    LHC in 2002 showed normal coronaries.    Osteoarthrosis, unspecified whether generalized or localized, unspecified site    Overweight(278.02)    Parkinson disease ([place])    S/P placement of cardiac pacemaker    a. 02/21/16: Medtronic Advisa DR MRI SureScan model L3TD42 (serial number [id number])    Serrated adenoma of colon 2007   Symptomatic menopausal or female climacteric states    Syncope and collapse    11/12: Holter 12/12 with rare PACS, HR range 64-140, average 82, no significant arrhythmias. Echo (1/13): EF 50-55%, mild LVH, septal-lateral dyssynchrony c/w LBBB. 3-week event monitor (1/13): No significant arrhythmia.    Past Surgical History:  Procedure Laterality Date   COLONOSCOPY  2007   EP IMPLANTABLE DEVICE N/A 02/21/2016   Procedure: Pacemaker Implant;  Surgeon: Will Meredith Leeds, MD;  Location: [facility];  Service: Cardiovascular;  Laterality: N/A;   POLYPECTOMY     Patient Active Problem List   Diagnosis Date Noted   Neuropathy involving both lower extremities 06/09/2021   Complaints of weakness of lower extremity 06/09/2021   Abnormal brain MRI 06/09/2021   Neck muscle weakness  57/26/2035   Diastolic CHF ([place]) 59/74/1638   Kyphosis due to degeneration of spine 05/05/2021   Drug-induced orofacial dyskinesia 05/05/2021   Hyperkinesis 05/05/2021   Traumatic intracerebral hemorrhage with loss of consciousness of 31 minutes to 59 minutes ([place]) 05/05/2021   Stammering and stuttering 05/05/2021   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[place]) 09/07/2018   Functional dyspepsia 09/08/2017   Slow transit constipation 09/08/2017   S/P placement of cardiac pacemaker    M[place]itz II 02/20/2016   Nystagmus, end-position 02/12/2016   Dizziness and giddiness 02/12/2016   Flapping tremor 02/12/2016   Chronic fatigue 10/28/2015   PCP NOTES >>>> 02/13/2015   TBI (traumatic brain injury) ([place]) 01/28/2015   Essential hypertension    Acute post-traumatic headache, not intractable    Vertigo due to brain injury ([place])    Vantage (subarachnoid hemorrhage) ([place]) 01/21/2015   Annual physical exam 09/07/2014   Orthostatic hypotension 08/30/2014   Acromioclavicular arthrosis 03/21/2014   Allergic rhinitis 02/27/2014   SI (sacroiliac) joint dysfunction 02/02/2014   Gastrocnemius strain, left 12/11/2013   Back pain 08/22/2013   LBBB (left bundle branch block) 05/13/2013   Shingles outbreak 12/15/2012   Borderline diabetes    Insomnia 01/12/2008   HLD (hyperlipidemia) 02/04/2007   Anxiety 10/12/2006   Osteoarthritis 10/12/2006    REFERRING DIAG: PD, neck pain  THERAPY DIAG:  Abnormal posture  Cervicalgia  Muscle weakness (generalized)  Unsteadiness on feet  PERTINENT HISTORY: Parkinsonism, anxiety, HLD, HTN, pacemaker, hx of TBI (2016)  Per neurologist's note on 06/09/21: DAT scan abnormal - we treat as PD.  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   PRECAUTIONS: Fall   SUBJECTIVE: Saw Dr. Carles Collet and she was diagnosed with an atypical Parkinsonism. In Dr. Doristine Devoid note, mentioned possibility of Botox, but  pt reports she saw Dr. Saintclair Halsted and he does not want her to get botox, but instead to continue with exercises (PT does not see any notes of this, but pt going to bring them the next time). Has mainly been walking for exercise, not doing neck exercises as much.   PAIN:  Are you having pain? Yes NPRS scale: 3/10 Pain location: back of the neck  Pain orientation: Posterior  PAIN TYPE: tightness and sore Aggravating factors: nothing Relieving factors: ibuprofen, laying down flat.   There were no vitals filed for this visit.    TODAY'S TREATMENT:    Self-Care:  Pt emotional about previous neurologist visit with Dr. Carles Collet and being diagnosed with Parkinsonism. Reviewed what Dr. Carles Collet said in her note regarding diagnosis, medication, and possibility of Botox (pt does not remember all this from her visit). Pt reports that she saw Dr. Saintclair Halsted and he recommended that she did not do Botox. Educated on what PT would continue to address in therapy regarding Parkinsonism and educated on cervical dystonia in regards to her Parkinsonism. Emotional support provided throughout session as pt very teary and crying at times. Pt report feeling down about this diagnosis, discussed mentioning this to her PCP and possibility of therapy to discuss with a licensed professional.    NMR:   Seated PWR Up x5 reps with cues for opening up hands and holding for 5 seconds for scap retraction strengthening.   Pt performs PWR! Moves in modified prone position (leaning forwards over blue physioball)   PWR! Up for improved posture 2 sets of 5 reps over physioball (pt leaning forwards and having forearms on ball), cues for scap retraction and cervical extension and holding for 5 seconds. Pt reporting that she enjoys this exercise and would like to do it at home. Showed pt how she can perform over a pillow and provided handout, performed an additional x5 reps over pillow.   PWR! Rock for improved weighshifting 2 sets of 5 reps over  physioball (pt leaning forwards and having forearms on ball) - 2 sets of 5 reps each side with giving pt target to reach towards and cues to look up where hand is going for cervical extension). Pt fatigued easily with this exercise   Pt getting teary eyed throughout exercises and breaks taken as needed with therapist providing emotional support.       PATIENT EDUCATION: Education details: See Self-Care above. Importance of performing HEP from therapy as well as walking to work on stretching/postural strengthening. Modified prone PWR Up to HEP (pt reporting liking this exercise and wanted to have it added to her exercises).  Person educated: Patient Education method: Explanation, Demonstration, and Verbal cues Education comprehension: verbalized understanding, returned demonstration, and needs further education    HOME EXERCISE PROGRAM: Access Code: [email or web address]     SHORT TERM GOALS: ALL STGS DUE 06/30/21  Pt will finish further assessment of miniBEST with LTG written. Baseline: 15/28 Target date: 07/04/2021 Goal status: MET  2.  Pt will consistently  report pain in neck as 2/10 or less in order to demo decr pain for functional mobility/gait. Baseline: 4-5/10 on 07/01/21 Target date: 07/04/2021 Goal status: NOT MET  3.  Pt will recover posterior balance in push and release test in 1 robust step independently, for improved balance recovery  Baseline: 2 steps; 5-6 steps and min A from therapist on 07/01/21  Target date: 07/04/2021 Goal status: NOT MET  4.  Pt will improve cervical extension AROM to at least 15 deg in order to demo improved AROM for functional mobility.  Baseline: 15 deg Target date: 07/04/2021 Goal status: MET    Updated/on-going LTG for 12 week POC:  LONG TERM GOALS: ALL LTGS DUE 08/29/21  Pt will be independent in final HEP with husband supervision in order to build upon functional gains made in therapy. Baseline: Pt's husband has not come back into session,  therapist needing to review exercises with pt occasionally   Goal status: ON-GOING  2.  Pt will improve miniBEST to at least a 19/28 in order to demo decr fall risk.  Baseline: 15/28, did not get to finish assessing on 07/29/21  Goal status: ON-GOING  3.  Pt will improve cervical AROM of R rotation to 45 degrees and L rotation to 45 degrees in order to demo improved functional mobility.  Baseline: R: 40 deg, L 40 deg on 06/02/21 R rotation; 35 deg, L 45 deg on 07/29/21  Goal status: ON-GOING  4.  Pt will score a 10 or less on the NDI in order to demo decr neck pain with functional activities.   Baseline: 13/50 = mild disability   Goal status: NEW  5.  Pt will recover lateral and posterior balance in push and release test in 1 robust step independently, for improved balance recovery Baseline:  R lateral = 1 step, L lateral = 2 steps   Goal status: REVISED          Plan  Clinical Impression Statement Since pt was last here, pt saw Dr. Carles Collet and was diagnosed with Parkinsonism. Provided emotional support throughout session as pt very tearful and reviewed what Dr. Carles Collet wrote in her note (pt did not remember). Worked on modified prone PWR moves today over physioball for cervical extension and scap retraction strengthening. Pt fatigued easily and pt enjoyed modified prone PWR Up and gave a version for pt to perform at home. Will continue to progress towards LTGs.   Personal Factors and Comorbidities Comorbidity 3+;Past/Current Experience;Time since onset of injury/illness/exacerbation  Comorbidities PMH: Parkinsonism, anxiety, HLD, HTN, pacemaker, syncope with collapse resulting in SAH/TBI 2016, aphasia/stuttering  Examination-Activity Limitations Transfers;Stairs;Stand;Locomotion Level  Examination-Participation Restrictions Community Activity;Cleaning;Laundry;Driving  Pt will benefit from skilled therapeutic intervention in order to improve on the following deficits Abnormal gait;Decreased  activity tolerance;Decreased coordination;Decreased balance;Decreased endurance;Decreased range of motion;Decreased strength;Difficulty walking;Impaired flexibility;Increased muscle spasms;Impaired sensation;Postural dysfunction;Pain;Decreased mobility;Decreased knowledge of use of DME;Impaired tone;Increased fascial restricitons;Impaired UE functional use;Hypomobility  Stability/Clinical Decision Making Evolving/Moderate complexity  Rehab Potential Fair (due to deficits)  PT Frequency 2x / week  PT Duration 12 weeks  PT Treatment/Interventions ADLs/Self Care Home Management;Moist Heat;DME Instruction;Gait training;Stair training;Functional mobility training;Therapeutic activities;Neuromuscular re-education;Balance training;Therapeutic exercise;Patient/family education;Manual techniques;Passive range of motion;Dry needling;Vestibular;Cryotherapy;Electrical Stimulation;Traction;Ultrasound;Taping  PT Next Visit Plan Continue to progress postural exercises, upper back/neck extension, upper and mid back strengthening.SciFit/Nustep for aerobic activity. Continue PWR moves.   PT Home Exercise Plan Access Code: [email or web address]  [email or web address] and Agree with Plan of Care Patient   [name], PT, DPT 08/14/21 8:02  AM

## 2021-08-15 ENCOUNTER — Telehealth: Payer: Self-pay | Admitting: Neurology

## 2021-08-15 ENCOUNTER — Ambulatory Visit: Payer: PPO | Admitting: Physical Therapy

## 2021-08-15 NOTE — Telephone Encounter (Signed)
Pt called an informed  She has chronic GI issues (well documented in her chart) and Dr Tat didn't change her levodopa dose.  She only changed her carbidopa dose and increasing that would have only helped GI issues compared to the dosage that she was on previously.  If she wants to go back to the 10/100 she can, but Dr tat thinks that would only make things worse.  She has a f/u at [facility] in the future (where she is following up) and she can discuss more with them. Pt verbalized understanding

## 2021-08-15 NOTE — Telephone Encounter (Signed)
Pt called in stating she has been having stomach pains that are not going away. She is thinking it may be due to her carbidopa-levodopa.

## 2021-08-15 NOTE — Chronic Care Management (AMB) (Signed)
  Chronic Care Management Note  08/15/2021 Name: Mandy Durham MRN: [id number] DOB: 11/11/1945  Mandy Durham is a 76 y.o. year old female who is a primary care patient of Colon Branch, MD and is actively engaged with the care management team. I reached out to Shaterrica S Steinborn by phone today to assist with re-scheduling a follow up visit with the Licensed Clinical Social Worker  Follow up plan: We have been unable to make contact with the patient for follow up. The care management team is available to follow up with the patient after provider conversation with the patient regarding recommendation for care management engagement and subsequent re-referral to the care management team.   Julian Hy, [place]  Direct Dial: [phone]

## 2021-08-18 ENCOUNTER — Ambulatory Visit: Payer: PPO | Attending: Adult Health | Admitting: Physical Therapy

## 2021-08-18 ENCOUNTER — Encounter: Payer: Self-pay | Admitting: Physical Therapy

## 2021-08-18 DIAGNOSIS — R2681 Unsteadiness on feet: Secondary | ICD-10-CM | POA: Diagnosis not present

## 2021-08-18 DIAGNOSIS — M542 Cervicalgia: Secondary | ICD-10-CM | POA: Insufficient documentation

## 2021-08-18 DIAGNOSIS — M6281 Muscle weakness (generalized): Secondary | ICD-10-CM | POA: Insufficient documentation

## 2021-08-18 DIAGNOSIS — R293 Abnormal posture: Secondary | ICD-10-CM | POA: Diagnosis not present

## 2021-08-18 NOTE — Therapy (Signed)
OUTPATIENT PHYSICAL THERAPY TREATMENT NOTE/10th VISIT PN   Patient Name: Mandy Durham MRN: [id number] DOB:1945-10-09, 76 y.o., female Today's Date: 08/18/2021  10th Visit Physical Therapy Progress Note  Dates of Reporting Period: 06/16/21 to 08/18/21   PCP: Colon Branch, MD REFERRING PROVIDER: Ward Givens, NP   PT End of Session - 08/18/21 1404     Visit Number 30    Number of Visits 31    Date for PT Re-Evaluation 08/29/21    Authorization Type [employer]    Progress Note Due on Visit 20    PT Start Time 1402    PT Stop Time 1442    PT Time Calculation (min) 40 min    Activity Tolerance Patient tolerated treatment well    Behavior During Therapy [facility] for tasks assessed/performed             Past Medical History:  Diagnosis Date   Anxiety state, unspecified    Hyperlipidemia    Hypertension    Insomnia    LBBB (left bundle branch block)    LHC in 2002 showed normal coronaries.    Osteoarthrosis, unspecified whether generalized or localized, unspecified site    Overweight(278.02)    Parkinson disease ([place])    S/P placement of cardiac pacemaker    a. 02/21/16: Medtronic Advisa DR MRI SureScan model [id number] (serial number [id number])    Serrated adenoma of colon 2007   Symptomatic menopausal or female climacteric states    Syncope and collapse    11/12: Holter 12/12 with rare PACS, HR range 64-140, average 82, no significant arrhythmias. Echo (1/13): EF 50-55%, mild LVH, septal-lateral dyssynchrony c/w LBBB. 3-week event monitor (1/13): No significant arrhythmia.    Past Surgical History:  Procedure Laterality Date   COLONOSCOPY  2007   EP IMPLANTABLE DEVICE N/A 02/21/2016   Procedure: Pacemaker Implant;  Surgeon: Will Meredith Leeds, MD;  Location: [place] [facility];  Service: Cardiovascular;  Laterality: N/A;   POLYPECTOMY     Patient Active Problem List   Diagnosis Date Noted   Neuropathy involving both lower extremities 06/09/2021   Complaints of  weakness of lower extremity 06/09/2021   Abnormal brain MRI 06/09/2021   Neck muscle weakness 42/12/3126   Diastolic CHF ([place]) 11/88/6773   Kyphosis due to degeneration of spine 05/05/2021   Drug-induced orofacial dyskinesia 05/05/2021   Hyperkinesis 05/05/2021   Traumatic intracerebral hemorrhage with loss of consciousness of 31 minutes to 59 minutes ([place]) 05/05/2021   Stammering and stuttering 05/05/2021   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[place]) 09/07/2018   Functional dyspepsia 09/08/2017   Slow transit constipation 09/08/2017   S/P placement of cardiac pacemaker    Mobitz II 02/20/2016   Nystagmus, end-position 02/12/2016   Dizziness and giddiness 02/12/2016   Flapping tremor 02/12/2016   Chronic fatigue 10/28/2015   PCP NOTES >>>> 02/13/2015   TBI (traumatic brain injury) ([place]) 01/28/2015   Essential hypertension    Acute post-traumatic headache, not intractable    Vertigo due to brain injury ([place])    [place] (subarachnoid hemorrhage) ([place]) 01/21/2015   Annual physical exam 09/07/2014   Orthostatic hypotension 08/30/2014   Acromioclavicular arthrosis 03/21/2014   Allergic rhinitis 02/27/2014   SI (sacroiliac) joint dysfunction 02/02/2014   Gastrocnemius strain, left 12/11/2013   Back pain 08/22/2013   LBBB (left bundle branch block) 05/13/2013   Shingles outbreak 12/15/2012   Borderline diabetes    Insomnia 01/12/2008   HLD (hyperlipidemia) 02/04/2007   Anxiety 10/12/2006   Osteoarthritis 10/12/2006  REFERRING DIAG: PD, neck pain  THERAPY DIAG:  Abnormal posture  Cervicalgia  Muscle weakness (generalized)  Unsteadiness on feet  PERTINENT HISTORY: Parkinsonism, anxiety, HLD, HTN, pacemaker, hx of TBI (2016)  Per neurologist's note on 06/09/21: DAT scan abnormal - we treat as PD.  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   PRECAUTIONS: Fall   SUBJECTIVE: Tried the new exercise  from last time and it is going well at home. Wants to review it once.   PAIN:  Are you having pain? Yes NPRS scale: 5/10 Pain location: back of the neck  Pain orientation: Posterior  PAIN TYPE: tightness and sore Aggravating factors: nothing Relieving factors: ibuprofen, laying down flat.   There were no vitals filed for this visit.    TODAY'S TREATMENT:   Self-Care Answered pt's questions regarding difference between her neurologist and neurosurgeon. Pt asking about her recent telephone encounter with Dr. Carles Collet and discussed with pt that any further questions about her Sinemet dose should be discussed with [facility] (per Dr. Doristine Devoid note). Pt asking where she can be prescribed pain medicine, discussed that pt would need to talk about this with her PCP.   NMR:   Seated PWR Up x5 reps with cues for opening up hands and holding for 5 seconds for scap retraction strengthening.    PWR! Up for improved posture (modified prone) 2 sets of 5 reps over pillow on lap (pt leaning forwards and having forearms on pillow), cues for scap retraction and cervical extension and holding for 5 seconds. Reviewed as previous addition to pt's HEP.   Standing with wide BOS at countertop, 2 sets of 5 reps reaching towards sticky notes for lateral weight shifting, lateral trunk elongation, and posture. Cues for 5 second holds with reaching and looking at sticky note. 2nd set performed without UE support for balance challenge. Pt fatigued easily and needing a seated rest break between each.  Holding purple ball, performed sit <> stand with no UE support x5 reps, cues for proper set up position with feet tucked back. When standing, pt bringing ball up overhead as far as she can and trying to look up (pt able to bring head looking straight ahead). Performed an additional x5 reps without holding onto ball and raising arms overhead (pt asking how she can perform this one for home). Cues to try to extend legs in standing as well.  Added to HEP.      PATIENT EDUCATION: Education details: See Self-Care above. Added sit <> stand with overhead reach to pt's HEP.  Person educated: Patient Education method: Explanation, Demonstration, and Verbal cues Education comprehension: verbalized understanding, returned demonstration, and needs further education    HOME EXERCISE PROGRAM: Access Code: [email or web address]     SHORT TERM GOALS: ALL STGS DUE 06/30/21  Pt will finish further assessment of miniBEST with LTG written. Baseline: 15/28 Target date: 07/04/2021 Goal status: MET  2.  Pt will consistently report pain in neck as 2/10 or less in order to demo decr pain for functional mobility/gait. Baseline: 4-5/10 on 07/01/21 Target date: 07/04/2021 Goal status: NOT MET  3.  Pt will recover posterior balance in push and release test in 1 robust step independently, for improved balance recovery  Baseline: 2 steps; 5-6 steps and min A from therapist on 07/01/21  Target date: 07/04/2021 Goal status: NOT MET  4.  Pt will improve cervical extension AROM to at least  15 deg in order to demo improved AROM for functional mobility.  Baseline: 15 deg Target date: 07/04/2021 Goal status: MET    Updated/on-going LTG for 12 week POC:  LONG TERM GOALS: ALL LTGS DUE 08/29/21  Pt will be independent in final HEP with husband supervision in order to build upon functional gains made in therapy. Baseline: Pt's husband has not come back into session, therapist needing to review exercises with pt occasionally   Goal status: ON-GOING  2.  Pt will improve miniBEST to at least a 19/28 in order to demo decr fall risk.  Baseline: 15/28, did not get to finish assessing on 07/29/21  Goal status: ON-GOING  3.  Pt will improve cervical AROM of R rotation to 45 degrees and L rotation to 45 degrees in order to demo improved functional mobility.  Baseline: R: 40 deg, L 40 deg on 06/02/21 R rotation; 35 deg, L 45 deg on 07/29/21  Goal status:  ON-GOING  4.  Pt will score a 10 or less on the NDI in order to demo decr neck pain with functional activities.   Baseline: 13/50 = mild disability   Goal status: NEW  5.  Pt will recover lateral and posterior balance in push and release test in 1 robust step independently, for improved balance recovery Baseline:  R lateral = 1 step, L lateral = 2 steps   Goal status: REVISED          Plan  Clinical Impression Statement 10th visit PN: Continued to work on postural exercises in sitting as well as in standing with isometric holds in end ranges of positions for strengthening. Pt fatigues easily with exercises and needing intermittent seated rest breaks during session. Added sit <> stands with overhead reach for functional strengthening/posture (per pt request) to pt's HEP with pt able to tolerate well. Since last goal assessment date, pt has seen Dr. Carles Collet and was diagnosed with Parkinsonism and pt with cervical dystonia. Pt continues with significant ROM deficits and normal posture is her chin to her chest and head turned to the R. Pt able to extend neck to neutral positioning, but unable to hold it for very long.  Will continue to progress towards LTGs.   Personal Factors and Comorbidities Comorbidity 3+;Past/Current Experience;Time since onset of injury/illness/exacerbation  Comorbidities PMH: Parkinsonism, anxiety, HLD, HTN, pacemaker, syncope with collapse resulting in SAH/TBI 2016, aphasia/stuttering  Examination-Activity Limitations Transfers;Stairs;Stand;Locomotion Level  Examination-Participation Restrictions Community Activity;Cleaning;Laundry;Driving  Pt will benefit from skilled therapeutic intervention in order to improve on the following deficits Abnormal gait;Decreased activity tolerance;Decreased coordination;Decreased balance;Decreased endurance;Decreased range of motion;Decreased strength;Difficulty walking;Impaired flexibility;Increased muscle spasms;Impaired sensation;Postural  dysfunction;Pain;Decreased mobility;Decreased knowledge of use of DME;Impaired tone;Increased fascial restricitons;Impaired UE functional use;Hypomobility  Stability/Clinical Decision Making Evolving/Moderate complexity  Rehab Potential Fair (due to deficits)  PT Frequency 2x / week  PT Duration 12 weeks  PT Treatment/Interventions ADLs/Self Care Home Management;Moist Heat;DME Instruction;Gait training;Stair training;Functional mobility training;Therapeutic activities;Neuromuscular re-education;Balance training;Therapeutic exercise;Patient/family education;Manual techniques;Passive range of motion;Dry needling;Vestibular;Cryotherapy;Electrical Stimulation;Traction;Ultrasound;Taping  PT Next Visit Plan Continue to progress postural exercises, upper back/neck extension, upper and mid back strengthening.SciFit/Nustep for aerobic activity. Continue PWR moves.   PT Home Exercise Plan Access Code: [email or web address]  [email or web address] and Agree with Plan of Care Patient   [name], PT, DPT 08/18/21 4:31 PM

## 2021-08-20 ENCOUNTER — Ambulatory Visit: Payer: PPO | Admitting: Physical Therapy

## 2021-08-24 NOTE — Progress Notes (Unsigned)
Cardiology Office Note Date:  08/24/2021  Patient ID:  Karita Diannah, Rindfleisch 1945-08-29, MRN [id number] PCP:  Colon Branch, MD  Cardiologist: Dr. Aundra Dubin (2017)  Electrophysiologist:  Dr. Curt Bears    Chief Complaint:   planned follow up  History of Present Illness: Syrena BRENDALYN VALLELY is a 76 y.o. female with history of HTN, HLD, LBBB (since around 2000, remote notes report LHC in 2002 showed normal coronaries), traumatic SAH (2016)advanced heart block w/PPM. She has a stuttering speech pattern and a R hand/forearm tremor she state since the time of her head injury. Vascular Parkinsonism  I saw her 10/06/19, for CP She reports for the last couple months getting SOB easily.  Reports that before she had no difficulties with her ADLs, gives an example of walking to her mail box and back without SOB and now has to catch her breath at the mailbox to walk back, and is SOB once back at the house.  When asked how far a walk it is she reports "not too far" She will get winded with some house chores, not others.  This very unusual for her. When asked about CP, she says no, just very SOB. Discussed if any heaviness, pressure, she says no. She mentions that she told Dr. Curt Bears last time here and he stopped one of her medicines but that did not help.  When I tell her that was end of March she is surprised, states she did not think she had been feeling poorly that long. She is certain and firm that she is not having CP, but the SOB does make her anxious No rest SOB, no dizzy spells, near syncope or syncope  She denies any swelling ,bolating, no symptoms of orthopnea or PND. Noted VP only 2%, planned for an echo and stress test to evaluate her progressive DOE Stress test was low risk with no ischemia or prior infarct Echo looked OK, LVEF 50-55%, Anteroseptal and inferoseptal hypokinesis.  Left ventricular diastolic parameters are consistent with Grade I diastolic dysfunction (impaired relaxation). Elevated left  ventricular end-diastolic pressure. RV OK, no significant VHD She was started on lasix and then uptitrated and planned for follow up f/u BMET was stable  F/u 12/04/19 She feels OK, thinks the lasix has helped, still has some days that she feels a little winded with activities, but all in all better. No CP, palpitations No rest SOB, no symptoms of PND or orthopnea, though wakes a few times to urinate. Since our last visit she has hd once or twice both while seated a very fleeting/sudden dizzy spell that she though she was blacking out.  Over a fast as it started. No changes, labs done, planned to monitor for recurrent symptoms  She has seen Dr. Curt Bears subsequently doing well, no changes made at his visit  More recently saw A. Albertina [place], [place], March 2022 he discussed worsening SOB over the year despiet  and referred to pulmonology given unremarkable cardiac work up.   I saw her 01/17/21 She feels pretty lousy, says her SOB is significant and she can hardly take care of the day to day activities. That being said, she reports that she has felt this way at least 63mo since she saw AJonni Sanger and has been SOB for more then a year. SHe is clear that how she feels today is how she felt in March, not particularly escalating, but none better. She saw pulmonology and reports that she was supposed to be getting a test done, but has  not yet She has had some CP this is random, Infrequent sharp and momentary No near syncope or syncope. She denies symptoms of orthopnea, sometimes she feels SOB seated and standing up seems to help, though her SOB is always the worst when exerting herself, settles after a few minutes No palpitations  Felt to be volume OL,, turned out she was not taking lasix it seemed since originally prescribed, thinking was to use when she was having difficulty urinating She reported her current state of symptoms as ongoing for a year, had not had any acute/recent progression She was advised to  start the lasix taking it daily , updated labs Discussed ER precautions with her  She saw her PMD 01/22/21, discussed her anxiety better with Lexapro, and failure to thrive Described as slightly more frail, was caring for her huband No mention or noted c/o SOB  I saw her 01/31/21 She is a little less SOB but not resolved or to her baseline No CP, no palpitations No near syncope or syncope LOOKED much better, though some volume OL remained Lasix was pulsed again  02/13/21, saw neurology team with increased parkinson symptoms and her Sinemet increased and planned for PT 02/20/21, saw pulmonary, SOB felt multifactorial, with some atopic symptoms trial on  ICS/LABA although not optimistic will be very helpful   I saw her 03/26/21 She again looks better to me, leaning less, breathing comfortably, more conversational then a couple visits ago for sure She reports that she feels SOB and thinks she needs more furosemide. By her observation for the few days on higher furosemide her breathing was better but then at '20mg'$  daily gt more winded again. No CP, no palpitations No near syncope or syncope She is very troubled with the thought she has or may have Parkinson's Her lasix was increased some to alternating days of '40mg'$ /'20mg'$  and f/u labs  3/27: K+ 3.9, Creat 0.77   I saw her 06/20/21 Still w/DOE, not better, not worse No rest SOB NO CP, palpitations No dizzy spells, near syncope or syncope She declined to get an echo done, again pulsed/increased her lasix, planned for labs and f/u  06/30/21 saw pulm, discussed normal spirometry, intermittent SOB/symptoms, ? +/- anxiety Getting PT  Pulm visit again 08/12/21, feeling better/breathing better with tiral of Breztri, some suspect of placebo effect with reports of improved mood with it as well.  TODAY She looks much better! She does feel a bit better as well, breathing easier. No CP, palpitations  No near syncope or syncope. She is  participating in PT but not sure if it is helping.   Device information MDT dual chamber PPM implanted 02/21/2016  Past Medical History:  Diagnosis Date   Anxiety state, unspecified    Hyperlipidemia    Hypertension    Insomnia    LBBB (left bundle branch block)    LHC in 2002 showed normal coronaries.    Osteoarthrosis, unspecified whether generalized or localized, unspecified site    Overweight(278.02)    Parkinson disease ([place])    S/P placement of cardiac pacemaker    a. 02/21/16: Medtronic Advisa DR MRI SureScan model H0WC37 (serial number [id number])    Serrated adenoma of colon 2007   Symptomatic menopausal or female climacteric states    Syncope and collapse    11/12: Holter 12/12 with rare PACS, HR range 64-140, average 82, no significant arrhythmias. Echo (1/13): EF 50-55%, mild LVH, septal-lateral dyssynchrony c/w LBBB. 3-week event monitor (1/13): No significant arrhythmia.  Past Surgical History:  Procedure Laterality Date   COLONOSCOPY  2007   EP IMPLANTABLE DEVICE N/A 02/21/2016   Procedure: Pacemaker Implant;  Surgeon: Will Meredith Leeds, MD;  Location: [facility];  Service: Cardiovascular;  Laterality: N/A;   POLYPECTOMY      Current Outpatient Medications  Medication Sig Dispense Refill   acetaminophen (TYLENOL) 500 MG tablet Take 500 mg by mouth every 6 (six) hours as needed.     aspirin EC 81 MG tablet Take 81 mg by mouth daily. Swallow whole.     Budeson-Glycopyrrol-Formoterol (BREZTRI AEROSPHERE) 160-9-4.8 MCG/ACT AERO Inhale 2 puffs into the lungs in the morning and at bedtime. 10.7 g 11   carbidopa-levodopa (SINEMET IR) 25-100 MG tablet Take 1 tablet by mouth 4 times daily at 8am/noon/4pm/8pm 360 tablet 1   Cholecalciferol (VITAMIN D PO) Take 1 tablet by mouth 2 (two) times a week. Vitamin D     escitalopram (LEXAPRO) 10 MG tablet Take 1 tablet (10 mg total) by mouth daily. 30 tablet 6   ezetimibe (ZETIA) 10 MG tablet Take 1 tablet (10 mg  total) by mouth daily. 90 tablet 1   furosemide (LASIX) 20 MG tablet Take 1 tablet (20 mg total) by mouth daily. Alternating 40 mg every other day 90 tablet 3   losartan (COZAAR) 100 MG tablet Take 1 tablet (100 mg total) by mouth daily. 30 tablet 3   nitroGLYCERIN (NITROSTAT) 0.4 MG SL tablet Place 1 tablet (0.4 mg total) under the tongue every 5 (five) minutes x 3 doses as needed for chest pain. 25 tablet 1   potassium chloride (KLOR-CON) 10 MEQ tablet Take 1 tablet (10 mEq total) by mouth daily. 90 tablet 3   traMADol (ULTRAM) 50 MG tablet Take 50 mg by mouth every 6 (six) hours as needed.     No current facility-administered medications for this visit.    Allergies:   Patient has no known allergies.   Social History:  The patient  reports that she has never smoked. She has never been exposed to tobacco smoke. She has never used smokeless tobacco. She reports that she does not drink alcohol and does not use drugs.   Family History:  The patient's family history includes Coronary artery disease in her mother; Dementia in her mother; Diabetes in her mother; Heart failure in her mother.  ROS:  Please see the history of present illness.  All other systems are reviewed and otherwise negative.   PHYSICAL EXAM:  VS:  There were no vitals taken for this visit. BMI: There is no height or weight on file to calculate BMI. Well nourished, well developed, in no acute distress, appears frail, but more sturdy today HEENT: normocephalic, atraumatic  Neck: no JVD, carotid bruits or masses Cardiac: RRR; tachycardic, soft SM, no rubs, or gallops Lungs:   CTA b/l, no wheezing, rhonchi or rales  Abd: soft, nontender MS: no deformity, thin body habitus, age appropriate atrophy Ext:  trace  edema  Skin: warm and dry, no rash Neuro:   She has a stuttered speech pattern and a resting R hand/forearm tremor, leans to the left today, otherwise no gross deficits appreciated Psych: euthymic mood, full  affect  PPM site is stable, no tethering or discomfort   EKG:  not done today   PPM interrogation done today and reviewed by myself: Battery and lead measurements are good She is in ST/VP again today though her HR histograms look OK No arrhythmias  10/20/2019: TTE IMPRESSIONS  1. Anteroseptal and inferoseptal hypokinesis. Left ventricular ejection  fraction, by estimation, is 50 to 55%. The left ventricle has low normal  function. The left ventricle demonstrates regional wall motion  abnormalities (see scoring diagram/findings  for description). Left ventricular diastolic parameters are consistent  with Grade I diastolic dysfunction (impaired relaxation). Elevated left  ventricular end-diastolic pressure.   2. Right ventricular systolic function is normal. The right ventricular  size is normal. There is normal pulmonary artery systolic pressure.   3. Left atrial size was mildly dilated.   4. The mitral valve is normal in structure. Trivial mitral valve  regurgitation. No evidence of mitral stenosis.   5. Tricuspid valve regurgitation is mild to moderate.   6. The aortic valve is tricuspid. Aortic valve regurgitation is not  visualized. No aortic stenosis is present.   7. The inferior vena cava is normal in size with greater than 50%  respiratory variability, suggesting right atrial pressure of 3 mmHg.     10/20/2019; lexiscan stress myoview Nuclear stress EF: 50%. The left ventricular ejection fraction is mildly decreased (45-54%). Visually, the EF appears greater than the 50% computer generated EF There was no ST segment deviation noted during stress. This is a low risk study. There is no evidence of ischemia and no evidence of previous infarction. The study is normal.    TTE 07/17/15  Review of the above records today demonstrates:  - Left ventricle: The cavity size was normal. There was severe   focal basal hypertrophy of the septum. Systolic function was   normal. The  estimated ejection fraction was in the range of 50%   to 55%. Anteroseptal hypokinesis. Doppler parameters are   consistent with abnormal left ventricular relaxation (grade 1   diastolic dysfunction). The E/e&' ratio is between 8-15,   suggesting indeterminate LV filling pressure. - Mitral valve: Mildly thickened leaflets . There was trivial   regurgitation. - Left atrium: The atrium was normal in size. - Tricuspid valve: There was mild regurgitation. - Pulmonary arteries: PA peak pressure: 32 mm Hg (S). - Inferior vena cava: The vessel was normal in size. The   respirophasic diameter changes were in the normal range (>= 50%),   consistent with normal central venous pressure.  Recent Labs: 01/17/2021: NT-Pro BNP 724 06/09/2021: ALT 7; BUN 15; Creatinine, Ser 0.77; Hemoglobin 13.2; Platelets 293; Potassium 3.9; Sodium 138; TSH 1.050  05/26/2021: Cholesterol 200; HDL 108.70; LDL Cholesterol 76; Total CHOL/HDL Ratio 2; Triglycerides 75.0; VLDL 15.0   CrCl cannot be calculated (Patient's most recent lab result is older than the maximum 21 days allowed.).   Wt Readings from Last 3 Encounters:  08/12/21 108 lb (49 kg)  07/30/21 106 lb (48.1 kg)  06/30/21 109 lb (49.4 kg)     Other studies reviewed: Additional studies/records reviewed today include: summarized above  ASSESSMENT AND PLAN:  1. PPM     Intact function     No programming changes amde  2. HTN    Looks OK  3. Diastolic dysfunction 4. Acute/chronic CHF     Volume status looks much better     BMET today   Disposition: remotes as susual, back in clinic in 26mo sooner if needed        Current medicines are reviewed at length with the patient today.  The patient did not have any concerns regarding medicines.  SVenetia Night PA-C 08/24/2021 4:12 PM     [place]  [id number] [phone] (office)  [phone] (fax)

## 2021-08-25 ENCOUNTER — Encounter: Payer: Self-pay | Admitting: Physical Therapy

## 2021-08-25 ENCOUNTER — Telehealth: Payer: Self-pay | Admitting: Neurology

## 2021-08-25 ENCOUNTER — Ambulatory Visit: Payer: PPO | Admitting: Physical Therapy

## 2021-08-25 ENCOUNTER — Telehealth: Payer: Self-pay | Admitting: Pulmonary Disease

## 2021-08-25 DIAGNOSIS — R293 Abnormal posture: Secondary | ICD-10-CM

## 2021-08-25 DIAGNOSIS — M542 Cervicalgia: Secondary | ICD-10-CM

## 2021-08-25 DIAGNOSIS — R2681 Unsteadiness on feet: Secondary | ICD-10-CM

## 2021-08-25 DIAGNOSIS — M6281 Muscle weakness (generalized): Secondary | ICD-10-CM

## 2021-08-25 NOTE — Therapy (Addendum)
OUTPATIENT PHYSICAL THERAPY TREATMENT NOTE   Patient Name: Mandy Durham MRN: [id number] DOB:1945/05/27, 76 y.o., female Today's Date: 08/25/2021    PCP: Colon Branch, MD REFERRING PROVIDER: Ward Givens, NP   PT End of Session - 08/25/21 1406     Visit Number 31    Number of Visits 31    Date for PT Re-Evaluation 08/29/21    Authorization Type [employer]    Progress Note Due on Visit 20    PT Start Time 1402    PT Stop Time 1443    PT Time Calculation (min) 41 min    Activity Tolerance Patient tolerated treatment well    Behavior During Therapy WFL for tasks assessed/performed             Past Medical History:  Diagnosis Date   Anxiety state, unspecified    Hyperlipidemia    Hypertension    Insomnia    LBBB (left bundle branch block)    LHC in 2002 showed normal coronaries.    Osteoarthrosis, unspecified whether generalized or localized, unspecified site    Overweight(278.02)    Parkinson disease ([place])    S/P placement of cardiac pacemaker    a. 02/21/16: Medtronic Advisa DR MRI SureScan model E5ID78 (serial number [id number])    Serrated adenoma of colon 2007   Symptomatic menopausal or female climacteric states    Syncope and collapse    11/12: Holter 12/12 with rare PACS, HR range 64-140, average 82, no significant arrhythmias. Echo (1/13): EF 50-55%, mild LVH, septal-lateral dyssynchrony c/w LBBB. 3-week event monitor (1/13): No significant arrhythmia.    Past Surgical History:  Procedure Laterality Date   COLONOSCOPY  2007   EP IMPLANTABLE DEVICE N/A 02/21/2016   Procedure: Pacemaker Implant;  Surgeon: Will Meredith Leeds, MD;  Location: [facility];  Service: Cardiovascular;  Laterality: N/A;   POLYPECTOMY     Patient Active Problem List   Diagnosis Date Noted   Neuropathy involving both lower extremities 06/09/2021   Complaints of weakness of lower extremity 06/09/2021   Abnormal brain MRI 06/09/2021   Neck muscle weakness  61/44/3154   Diastolic CHF ([place]) 00/86/7619   Kyphosis due to degeneration of spine 05/05/2021   Drug-induced orofacial dyskinesia 05/05/2021   Hyperkinesis 05/05/2021   Traumatic intracerebral hemorrhage with loss of consciousness of 31 minutes to 59 minutes ([place]) 05/05/2021   Stammering and stuttering 05/05/2021   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[place]) 09/07/2018   Functional dyspepsia 09/08/2017   Slow transit constipation 09/08/2017   S/P placement of cardiac pacemaker    Mobitz II 02/20/2016   Nystagmus, end-position 02/12/2016   Dizziness and giddiness 02/12/2016   Flapping tremor 02/12/2016   Chronic fatigue 10/28/2015   PCP NOTES >>>> 02/13/2015   TBI (traumatic brain injury) ([place]) 01/28/2015   Essential hypertension    Acute post-traumatic headache, not intractable    Vertigo due to brain injury ([place])    McBee (subarachnoid hemorrhage) ([place]) 01/21/2015   Annual physical exam 09/07/2014   Orthostatic hypotension 08/30/2014   Acromioclavicular arthrosis 03/21/2014   Allergic rhinitis 02/27/2014   SI (sacroiliac) joint dysfunction 02/02/2014   Gastrocnemius strain, left 12/11/2013   Back pain 08/22/2013   LBBB (left bundle branch block) 05/13/2013   Shingles outbreak 12/15/2012   Borderline diabetes    Insomnia 01/12/2008   HLD (hyperlipidemia) 02/04/2007   Anxiety 10/12/2006   Osteoarthritis 10/12/2006    REFERRING DIAG: PD, neck pain  THERAPY DIAG:  Abnormal posture  Cervicalgia  Muscle weakness (generalized)  Unsteadiness on feet  PERTINENT HISTORY: Parkinsonism, anxiety, HLD, HTN, pacemaker, hx of TBI (2016)  Per neurologist's note on 06/09/21: DAT scan abnormal - we treat as PD.  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   PRECAUTIONS: Fall   SUBJECTIVE: Wants to review her most recent exercises.   PAIN:  Are you having pain? Yes NPRS scale: 5/10 Pain location: back  of the neck  Pain orientation: Posterior  PAIN TYPE: tightness and sore Aggravating factors: nothing Relieving factors: ibuprofen, laying down flat.   There were no vitals filed for this visit.    TODAY'S TREATMENT:    Self-Care:  Reviewed with pt about recent neurologist visit with Dr. Carles Collet regarding Parkinsonism diagnosis and her cervical dystonia (pt asking the same questions at this appt). Continued to discuss with pt a realistic prognosis with PT regarding her cervical dystonia. Pt asking multiple times about if her neck will get back to how it was. PT educating on nature and pathophysiology of dystonia and that what PT can work on is ROM, strengthening muscles in neck and back to help with posture and decr her pain as pt's posturing won't be able to go back to how it was (in neutral positioning at all times). Pt also asking about what meds she can take for her pain (also told pt to discuss this with her PCP).    Access Code: [email or web address] URL: [email or web address] Date: 08/25/2021 Prepared by: [name]  Began to review bolded exercises and condense HEP due to anticipated D/C visit at next session:   Pt reports that she has not been performing these consistently - has mainly just been walking for exercise. Discussed importance of trying to perform these exercises specific for her neck, upper back strengthening and posture daily.   See MedBridge for more details.   Exercises - Sidelying Neck Sidebending  - 1 x daily - 7 x weekly - 2 sets - 10 reps - Supine Bridge  - 1 x daily - 7 x weekly - 1 sets - 10 reps - also educated importance of laying supine to allow gravity to assist with stretching anterior neck and chest as pt reports that she rarely lays supine. - Isometric Cervical Extension at Wall with Ball  - 1 x daily - 7 x weekly - 2 sets - 5 reps - 10 second hold - Scapular Retraction with Resistance  - 1 x daily - 7 x weekly - 2 sets - 5 reps - Seated Shoulder  Horizontal Abduction with Resistance - Palms Down  - 1 x daily - 7 x weekly - 2 sets - 5 reps -reviewed technique and cues for scap retraction   - Sternocleidomastoid Stretch  - 1 x daily - 7 x weekly - 2 sets - 20-30 hold - cued for incr technique with pt performing well, incr tightness when stretching L side, discussed focusing more on this side when stretching.  - Sit to Stand with Arm Swing  - 1 x daily - 5 x weekly - 2 sets - 5 reps - when standing lifting arms up overhead with cues looking up ahead  Supine horizontal ABD with yellow tband x5 reps - initial cues for proper technique. Removed from HEP as pt preferred doing these in sitting (wanted to condense pt's exercises to help increase compliance at home)     PATIENT EDUCATION: Education details: See Self-Care above.  Began to review HEP and finalize/condense to incr compliance at home. Continued to educate on importance of performing daily. Discussed plan to D/C at next session due to pt meeting her maximum potential with PT at this time and having pt taking a break for a few months to work on exercises at home. Pt in agreement with this plan at this time. Person educated: Patient Education method: Explanation, Demonstration, and Verbal cues Education comprehension: verbalized understanding, returned demonstration, and needs further education    HOME EXERCISE PROGRAM: Access Code: [email or web address]      SHORT TERM GOALS: ALL STGS DUE 06/30/21  Pt will finish further assessment of miniBEST with LTG written. Baseline: 15/28 Target date: 07/04/2021 Goal status: MET  2.  Pt will consistently report pain in neck as 2/10 or less in order to demo decr pain for functional mobility/gait. Baseline: 4-5/10 on 07/01/21 Target date: 07/04/2021 Goal status: NOT MET  3.  Pt will recover posterior balance in push and release test in 1 robust step independently, for improved balance recovery  Baseline: 2 steps; 5-6 steps and min A from therapist on  07/01/21  Target date: 07/04/2021 Goal status: NOT MET  4.  Pt will improve cervical extension AROM to at least 15 deg in order to demo improved AROM for functional mobility.  Baseline: 15 deg Target date: 07/04/2021 Goal status: MET    Updated/on-going LTG for 12 week POC:  LONG TERM GOALS: ALL LTGS DUE 08/29/21  Pt will be independent in final HEP with husband supervision in order to build upon functional gains made in therapy. Baseline: Pt's husband has not come back into session, therapist needing to review exercises with pt occasionally   Goal status: ON-GOING  2.  Pt will improve miniBEST to at least a 19/28 in order to demo decr fall risk.  Baseline: 15/28, did not get to finish assessing on 07/29/21  Goal status: ON-GOING  3.  Pt will improve cervical AROM of R rotation to 45 degrees and L rotation to 45 degrees in order to demo improved functional mobility.  Baseline: R: 40 deg, L 40 deg on 06/02/21 R rotation; 35 deg, L 45 deg on 07/29/21  Goal status: ON-GOING  4.  Pt will score a 10 or less on the NDI in order to demo decr neck pain with functional activities.   Baseline: 13/50 = mild disability   Goal status: NEW  5.  Pt will recover lateral and posterior balance in push and release test in 1 robust step independently, for improved balance recovery Baseline:  R lateral = 1 step, L lateral = 2 steps   Goal status: REVISED          Plan  Clinical Impression Statement Continued to have discussion about realistic prognosis with therapy regarding her cervical dystonia (see Self-Care above). Due to pt maximizing her rehab potential at this time discussed having pt take a break from therapy at this time and to work on exercises at home. Began to review and condense pt's HEP for home for posture/ROM/upper back strengthening. Due to pt's cognition question pt's carryover at home and pt has not had any family come in to appts with her. Will continue to progress towards  LTGs.   Personal Factors and Comorbidities Comorbidity 3+;Past/Current Experience;Time since onset of injury/illness/exacerbation  Comorbidities PMH: Parkinsonism, anxiety, HLD, HTN, pacemaker, syncope with collapse resulting in SAH/TBI 2016, aphasia/stuttering  Examination-Activity Limitations Transfers;Stairs;Stand;Locomotion Level  Examination-Participation Restrictions Community Activity;Cleaning;Laundry;Driving  Pt will benefit from skilled therapeutic  intervention in order to improve on the following deficits Abnormal gait;Decreased activity tolerance;Decreased coordination;Decreased balance;Decreased endurance;Decreased range of motion;Decreased strength;Difficulty walking;Impaired flexibility;Increased muscle spasms;Impaired sensation;Postural dysfunction;Pain;Decreased mobility;Decreased knowledge of use of DME;Impaired tone;Increased fascial restricitons;Impaired UE functional use;Hypomobility  Stability/Clinical Decision Making Evolving/Moderate complexity  Rehab Potential Fair (due to deficits)  PT Frequency 2x / week  PT Duration 12 weeks  PT Treatment/Interventions ADLs/Self Care Home Management;Moist Heat;DME Instruction;Gait training;Stair training;Functional mobility training;Therapeutic activities;Neuromuscular re-education;Balance training;Therapeutic exercise;Patient/family education;Manual techniques;Passive range of motion;Dry needling;Vestibular;Cryotherapy;Electrical Stimulation;Traction;Ultrasound;Taping  PT Next Visit Plan Finalize HEP and D/C, check goals if able. Plan for pt to take a break from therapy for a couple months.   PT Home Exercise Plan Access Code: [email or web address]  [email or web address] and Agree with Plan of Care Patient   [name], PT, DPT 08/25/21 3:34 PM

## 2021-08-25 NOTE — Telephone Encounter (Signed)
Patient came into the lobby and wanted to come in to see Dr. Brett Fairy. She wants to talk to her directly about her PT and possibly note getting any better. She would like to speak to only Dr. Brett Fairy. She has a hard time speaking with her stuttering so I told her I would send a message to see how to proceed. Next appt is with [facility] 7/21 and next appt for Dr. Brett Fairy is August. Thank you

## 2021-08-26 ENCOUNTER — Ambulatory Visit: Payer: PPO | Admitting: Physician Assistant

## 2021-08-26 ENCOUNTER — Other Ambulatory Visit: Payer: Self-pay | Admitting: *Deleted

## 2021-08-26 ENCOUNTER — Encounter: Payer: Self-pay | Admitting: Physician Assistant

## 2021-08-26 VITALS — BP 134/84 | HR 105 | Ht 65.0 in | Wt 109.0 lb

## 2021-08-26 DIAGNOSIS — Z79899 Other long term (current) drug therapy: Secondary | ICD-10-CM | POA: Diagnosis not present

## 2021-08-26 DIAGNOSIS — I10 Essential (primary) hypertension: Secondary | ICD-10-CM

## 2021-08-26 DIAGNOSIS — Z950 Presence of cardiac pacemaker: Secondary | ICD-10-CM

## 2021-08-26 DIAGNOSIS — I5032 Chronic diastolic (congestive) heart failure: Secondary | ICD-10-CM | POA: Diagnosis not present

## 2021-08-26 LAB — CUP PACEART INCLINIC DEVICE CHECK
Battery Remaining Longevity: 44 mo
Battery Voltage: 2.98 V
Brady Statistic AP VP Percent: 5.74 %
Brady Statistic AP VS Percent: 0.39 %
Brady Statistic AS VP Percent: 80.38 %
Brady Statistic AS VS Percent: 13.49 %
Brady Statistic RA Percent Paced: 5.94 %
Brady Statistic RV Percent Paced: 87.32 %
Date Time Interrogation Session: 20230613142734
Implantable Lead Implant Date: 20171208
Implantable Lead Implant Date: 20171208
Implantable Lead Location: 753859
Implantable Lead Location: 753860
Implantable Lead Model: 5076
Implantable Lead Model: 5076
Implantable Pulse Generator Implant Date: 20171208
Lead Channel Impedance Value: 323 Ohm
Lead Channel Impedance Value: 342 Ohm
Lead Channel Impedance Value: 437 Ohm
Lead Channel Impedance Value: 456 Ohm
Lead Channel Pacing Threshold Amplitude: 0.5 V
Lead Channel Pacing Threshold Amplitude: 0.625 V
Lead Channel Pacing Threshold Pulse Width: 0.4 ms
Lead Channel Pacing Threshold Pulse Width: 0.4 ms
Lead Channel Sensing Intrinsic Amplitude: 1.5 mV
Lead Channel Sensing Intrinsic Amplitude: 11 mV
Lead Channel Sensing Intrinsic Amplitude: 11 mV
Lead Channel Sensing Intrinsic Amplitude: 3 mV
Lead Channel Setting Pacing Amplitude: 2 V
Lead Channel Setting Pacing Amplitude: 2.5 V
Lead Channel Setting Pacing Pulse Width: 0.4 ms
Lead Channel Setting Sensing Sensitivity: 4 mV

## 2021-08-26 MED ORDER — NITROGLYCERIN 0.4 MG SL SUBL: 0.4000 mg | SUBLINGUAL_TABLET | SUBLINGUAL | 1 refills | Status: AC | PRN

## 2021-08-26 NOTE — Patient Instructions (Addendum)
Medication Instructions:  Your physician recommends that you continue on your current medications as directed. Please refer to the Current Medication list given to you today. *If you need a refill on your cardiac medications before your next appointment, please call your pharmacy*   Lab Work: TODAY-BMET  If you have labs (blood work) drawn today and your tests are completely normal, you will receive your results only by: [place] (if you have MyChart) OR A paper copy in the mail If you have any lab test that is abnormal or we need to change your treatment, we will call you to review the results.   Testing/Procedures: NONE ORDERED   Follow-Up: At [facility], you and your health needs are our priority.  As part of our continuing mission to provide you with exceptional heart care, we have created designated Provider Care Teams.  These Care Teams include your primary Cardiologist (physician) and Advanced Practice Providers (APPs -  Physician Assistants and Nurse Practitioners) who all work together to provide you with the care you need, when you need it.  We recommend signing up for the patient portal called "MyChart".  Sign up information is provided on this After Visit Summary.  MyChart is used to connect with patients for Virtual Visits (Telemedicine).  Patients are able to view lab/test results, encounter notes, upcoming appointments, etc.  Non-urgent messages can be sent to your provider as well.   To learn more about what you can do with MyChart, go to [email or web address].    Your next appointment:   6 month(s)  The format for your next appointment:   In Person  Provider:   Dr Allegra Lai or Tommye Standard, PA    Other Instructions   Important Information About Sugar

## 2021-08-27 ENCOUNTER — Encounter: Payer: Self-pay | Admitting: Physical Therapy

## 2021-08-27 ENCOUNTER — Other Ambulatory Visit (HOSPITAL_COMMUNITY): Payer: Self-pay

## 2021-08-27 ENCOUNTER — Ambulatory Visit: Payer: PPO | Admitting: Physical Therapy

## 2021-08-27 DIAGNOSIS — M6281 Muscle weakness (generalized): Secondary | ICD-10-CM

## 2021-08-27 DIAGNOSIS — M542 Cervicalgia: Secondary | ICD-10-CM

## 2021-08-27 DIAGNOSIS — R293 Abnormal posture: Secondary | ICD-10-CM

## 2021-08-27 DIAGNOSIS — R2681 Unsteadiness on feet: Secondary | ICD-10-CM

## 2021-08-27 LAB — BASIC METABOLIC PANEL
BUN/Creatinine Ratio: 23 (ref 12–28)
BUN: 19 mg/dL (ref 8–27)
CO2: 22 mmol/L (ref 20–29)
Calcium: 9 mg/dL (ref 8.7–10.3)
Chloride: 102 mmol/L (ref 96–106)
Creatinine, Ser: 0.82 mg/dL (ref 0.57–1.00)
Glucose: 109 mg/dL — ABNORMAL HIGH (ref 70–99)
Potassium: 3.7 mmol/L (ref 3.5–5.2)
Sodium: 138 mmol/L (ref 134–144)
eGFR: 74 mL/min/{1.73_m2} (ref >59)

## 2021-08-27 NOTE — Telephone Encounter (Signed)
Called and left voicemail for patient to call office back in regards to her Breztri inhaler

## 2021-08-27 NOTE — Therapy (Addendum)
OUTPATIENT PHYSICAL THERAPY TREATMENT NOTE/DISCHARGE SUMMARY   Patient Name: Mandy Durham MRN: [id number] DOB:1945-05-21, 76 y.o., female Today's Date: 08/28/2021    PCP: Colon Branch, MD REFERRING PROVIDER: Ward Givens, NP   PT End of Session - 08/27/21 1407     Visit Number 32    Number of Visits 32    Date for PT Re-Evaluation 08/29/21    Authorization Type [employer]    Progress Note Due on Visit 20    PT Start Time 1405    PT Stop Time 1445    PT Time Calculation (min) 40 min    Activity Tolerance Patient tolerated treatment well    Behavior During Therapy WFL for tasks assessed/performed             Past Medical History:  Diagnosis Date   Anxiety state, unspecified    Hyperlipidemia    Hypertension    Insomnia    LBBB (left bundle branch block)    LHC in 2002 showed normal coronaries.    Osteoarthrosis, unspecified whether generalized or localized, unspecified site    Overweight(278.02)    Parkinson disease ([place])    S/P placement of cardiac pacemaker    a. 02/21/16: Medtronic Advisa DR MRI SureScan model E2AS34 (serial number [id number])    Serrated adenoma of colon 2007   Symptomatic menopausal or female climacteric states    Syncope and collapse    11/12: Holter 12/12 with rare PACS, HR range 64-140, average 82, no significant arrhythmias. Echo (1/13): EF 50-55%, mild LVH, septal-lateral dyssynchrony c/w LBBB. 3-week event monitor (1/13): No significant arrhythmia.    Past Surgical History:  Procedure Laterality Date   COLONOSCOPY  2007   EP IMPLANTABLE DEVICE N/A 02/21/2016   Procedure: Pacemaker Implant;  Surgeon: Will Meredith Leeds, MD;  Location: [facility];  Service: Cardiovascular;  Laterality: N/A;   POLYPECTOMY     Patient Active Problem List   Diagnosis Date Noted   Neuropathy involving both lower extremities 06/09/2021   Complaints of weakness of lower extremity 06/09/2021   Abnormal brain MRI 06/09/2021   Neck  muscle weakness 97/98/9211   Diastolic CHF ([place]) 94/17/4081   Kyphosis due to degeneration of spine 05/05/2021   Drug-induced orofacial dyskinesia 05/05/2021   Hyperkinesis 05/05/2021   Traumatic intracerebral hemorrhage with loss of consciousness of 31 minutes to 59 minutes ([place]) 05/05/2021   Stammering and stuttering 05/05/2021   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[place]) 09/07/2018   Functional dyspepsia 09/08/2017   Slow transit constipation 09/08/2017   S/P placement of cardiac pacemaker    [place] II 02/20/2016   Nystagmus, end-position 02/12/2016   Dizziness and giddiness 02/12/2016   Flapping tremor 02/12/2016   Chronic fatigue 10/28/2015   PCP NOTES >>>> 02/13/2015   TBI (traumatic brain injury) ([place]) 01/28/2015   Essential hypertension    Acute post-traumatic headache, not intractable    Vertigo due to brain injury ([place])    [place] (subarachnoid hemorrhage) ([place]) 01/21/2015   Annual physical exam 09/07/2014   Orthostatic hypotension 08/30/2014   Acromioclavicular arthrosis 03/21/2014   Allergic rhinitis 02/27/2014   SI (sacroiliac) joint dysfunction 02/02/2014   Gastrocnemius strain, left 12/11/2013   Back pain 08/22/2013   LBBB (left bundle branch block) 05/13/2013   Shingles outbreak 12/15/2012   Borderline diabetes    Insomnia 01/12/2008   HLD (hyperlipidemia) 02/04/2007   Anxiety 10/12/2006   Osteoarthritis 10/12/2006    REFERRING DIAG: PD, neck pain  THERAPY DIAG:  Abnormal posture  Cervicalgia  Muscle weakness (generalized)  Unsteadiness on feet  PERTINENT HISTORY: Parkinsonism, anxiety, HLD, HTN, pacemaker, hx of TBI (2016)  Per neurologist's note on 06/09/21: DAT scan abnormal - we treat as PD.  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   PRECAUTIONS: Fall   SUBJECTIVE: Reached out to Dr. Brett Fairy but haven't heard back.   PAIN:  Are you having pain? Yes NPRS scale:  3/10 Pain location: back of the neck  Pain orientation: Posterior  PAIN TYPE: tightness and sore Aggravating factors: nothing Relieving factors: ibuprofen, laying down flat.   There were no vitals filed for this visit.    TODAY'S TREATMENT:    Reviewed and updated pt's final HEP and gave final handout. Pt needs incr time when reviewing exercises. Discussed with pt importance of lying supine for 5-10 minutes throughout the day (pt does not lay supine at all), in order to help stretch anterior neck and chest musculature.   See MedBridge for further details.   Access Code: [email or web address] URL: [email or web address] Date: 08/27/2021 Prepared by: [name]  Program Notes Laying on your back on your bed and laying there for 5-10 minutes, a few times a day to allow gravity to stretch neck and chest.   Exercises - Sidelying Neck Sidebending  - 1 x daily - 7 x weekly - 2 sets - 10 reps - Supine Bridge  - 1 x daily - 7 x weekly - 1 sets - 10 reps - Seated Shoulder Horizontal Abduction with Resistance - Palms Down  - 1 x daily - 7 x weekly - 2 sets - 5 reps - Sternocleidomastoid Stretch  - 1 x daily - 7 x weekly - 2 sets - 20 hold - Sit to Stand with Arm Swing  - 1 x daily - 5 x weekly - 2 sets - 5 reps - Correct Standing Posture  - 1 x daily - 7 x weekly - 3 sets - 10 hold - standing against wall, cued to back up feet to wall and then try to bring shoulders and neck up into cervical extension and hold for ~10 seconds, pt unable to hold head erect the whole time. Pt initially hesistant about this exercise and not wanting to perform, therapist provided education on importance for posture and cervical extensor strengthening.     08/27/21 1434  AROM  Cervical - Right Rotation 35  Cervical - Left Rotation 45 (pt compensates as well with lateral neck flexion)    Showed pt information on Atypical Parkinsonism support group in the community and who to contact via e-mail in order  to get more information. Pt does not have an email and is unable to contact. Discussed with pt that PT will reach out to contact and give her pt's phone number (pt in agreement with this and consented for therapist to give her her phone number).   Discussed that since pt has met her maximum potential with PT at this time will plan to D/C and have pt schedule for return PD eval in ~6 months.   PHYSICAL THERAPY DISCHARGE SUMMARY  Visits from Start of Care: 32  Current functional level related to goals / functional outcomes: See clinical assessment statement   Remaining deficits: Significant postural abnormalities, decr strength, gait abnormalities, impaired balance, significantly decr cervical ROM.    Education / Equipment: HEP, information about atypical Parkinsonism support group, education on recent diagnosis from neurolgist  Patient agrees to discharge. Patient goals were not met. Patient is being discharged due to maximized rehab potential.    PATIENT EDUCATION: Education details: Finalized pt's HEP and continued to educate on purpose of each exercise. Plan for return evals in 6 months.  Person educated: Patient Education method: Explanation, Demonstration, Verbal cues, and Handouts Education comprehension: verbalized understanding, returned demonstration, and needs further education    HOME EXERCISE PROGRAM: Access Code: [email or web address]      SHORT TERM GOALS: ALL STGS DUE 06/30/21  Pt will finish further assessment of miniBEST with LTG written. Baseline: 15/28 Target date: 07/04/2021 Goal status: MET  2.  Pt will consistently report pain in neck as 2/10 or less in order to demo decr pain for functional mobility/gait. Baseline: 4-5/10 on 07/01/21 Target date: 07/04/2021 Goal status: NOT MET  3.  Pt will recover posterior balance in push and release test in 1 robust step independently, for improved balance recovery  Baseline: 2 steps; 5-6 steps and min A from therapist on  07/01/21  Target date: 07/04/2021 Goal status: NOT MET  4.  Pt will improve cervical extension AROM to at least 15 deg in order to demo improved AROM for functional mobility.  Baseline: 15 deg Target date: 07/04/2021 Goal status: MET    Updated/on-going LTG for 12 week POC:  LONG TERM GOALS: ALL LTGS DUE 08/29/21  Pt will be independent in final HEP with husband supervision in order to build upon functional gains made in therapy. Baseline: Pt's husband has not come back into session, therapist needing to review exercises with pt frequently.   Goal status: NOT MET  2.  Pt will improve miniBEST to at least a 19/28 in order to demo decr fall risk.  Baseline: 15/28, did not have time to assess during D/C day.   Goal status: DEFERRED  3.  Pt will improve cervical AROM of R rotation to 45 degrees and L rotation to 45 degrees in order to demo improved functional mobility.  Baseline: R: 40 deg, L 40 deg on 06/02/21 R rotation; 35 deg, L 45 deg on 07/29/21, same degrees on 08/27/21  Goal status: NOT MET  4.  Pt will score a 10 or less on the NDI in order to demo decr neck pain with functional activities.   Baseline: 13/50 = mild disability, did not have time to assess during D/C day.   Goal status: DEFERRED  5.  Pt will recover lateral and posterior balance in push and release test in 1 robust step independently, for improved balance recovery Baseline:  R lateral = 1 step, L lateral = 2 steps, did not have time to assess during D/C day.   Goal status: DEFERRED         Plan  Clinical Impression Statement Today's skilled session focused on reviewing remainder of HEP and providing final handout for D/C. Unsure of pt's compliance at home with exercises due to pt's cognitive deficits as pt needs frequent review from therapist of proper technique (despite handout with pictures and instructions) and pt's husband/family have not come into any sessions (therapist has asked multiple times for  family to come in for education for increased carryover at home). Pt has been recently diagnosed with Parkinsonism and continues with significant postural abnormalities and decr cervical ROM due to dystonia and has had no change in her neck pain since beginning therapy. Worked on incorporating balance activities into session with postural exercises, but pt with limited activity tolerance and needed frequent rest breaks  and pt requesting to focus mainly on posture exercises/strengthening. Multiple sessions have been spent reviewing pt's HEP for posture/ROM and strengthening (per pt request) as pt reports that she has not been consistently performing at home. Pt unable to hold her head in an erect posture in cervical extension for >5-10 seconds. Unable to assess remainder of LTGs for D/C this week as pt needs incr time to review HEP and exercises and unable to finish balance testing. Pt has reached her maximum potential with PT at this time and will plan to have pt scheduled in 6 months for return PD eval. Pt in agreement with plan.   Personal Factors and Comorbidities Comorbidity 3+;Past/Current Experience;Time since onset of injury/illness/exacerbation  Comorbidities PMH: Parkinsonism, anxiety, HLD, HTN, pacemaker, syncope with collapse resulting in SAH/TBI 2016, aphasia/stuttering  Examination-Activity Limitations Transfers;Stairs;Stand;Locomotion Level  Examination-Participation Restrictions Community Activity;Cleaning;Laundry;Driving  Pt will benefit from skilled therapeutic intervention in order to improve on the following deficits Abnormal gait;Decreased activity tolerance;Decreased coordination;Decreased balance;Decreased endurance;Decreased range of motion;Decreased strength;Difficulty walking;Impaired flexibility;Increased muscle spasms;Impaired sensation;Postural dysfunction;Pain;Decreased mobility;Decreased knowledge of use of DME;Impaired tone;Increased fascial restricitons;Impaired UE functional  use;Hypomobility  Stability/Clinical Decision Making Evolving/Moderate complexity  Rehab Potential Fair (due to deficits)  PT Frequency 2x / week  PT Duration 12 weeks  PT Treatment/Interventions ADLs/Self Care Home Management;Moist Heat;DME Instruction;Gait training;Stair training;Functional mobility training;Therapeutic activities;Neuromuscular re-education;Balance training;Therapeutic exercise;Patient/family education;Manual techniques;Passive range of motion;Dry needling;Vestibular;Cryotherapy;Electrical Stimulation;Traction;Ultrasound;Taping  PT Next Visit Plan D/C from therapy.   PT Home Exercise Plan Access Code: [email or web address]  Consulted and Agree with Plan of Care Patient   [name], PT, DPT 08/28/21 9:00 AM

## 2021-08-27 NOTE — Telephone Encounter (Signed)
Called patient and she states that her Judithann Sauger is too expensive.   Can we see if her Judithann Sauger is needing a PA or what is covered for her insurance.   Please advise

## 2021-09-01 NOTE — Telephone Encounter (Signed)
Called patient and left voicemail for her to call office back in regards to [facility] inhaler.

## 2021-09-01 NOTE — Telephone Encounter (Signed)
Letter sent. Nothing further needed

## 2021-09-03 ENCOUNTER — Telehealth: Payer: Self-pay | Admitting: Neurology

## 2021-09-03 ENCOUNTER — Ambulatory Visit: Payer: PPO | Admitting: Neurology

## 2021-09-03 MED ORDER — BACLOFEN 5 MG PO TABS: 5.0000 mg | ORAL_TABLET | Freq: Two times a day (BID) | ORAL | 5 refills | Status: DC | PRN

## 2021-09-03 NOTE — Telephone Encounter (Signed)
Called the patient and advised that Dr Brett Fairy agreed to ordering baclofen 5 mg twice a day as needed. Informed that she should be mindful of when she takes it in case she notices balance is weaker or sleepiness as these are side effects. She would want to make sure she doesn't have anything planned for the day after taking it for the first time to see how she does. Daughter states she will notify the pt and inform her of this as well. Script will be sent to the pharmacy

## 2021-09-03 NOTE — Telephone Encounter (Signed)
Pt's daughter states she went to pt home, they cleaned medication cabinet and ran across :carbidopa-levodopa (SINEMET IR) 25-100 MG tablet Ropinerol 5 mg & carbidopa-levodopa (SINEMET IR) 10-'100mg'$  (date on bottles not clear) Daughter states pt had these 3 bottles separate from medications pt thinks she should be taken daily.  Pt admitted to daughter that she has not been taken any of her medications for weeks due to trying to assist her spouse with his medications.  Daughter is asking for a call from RN to clarify what all pt should be taken and how they are to be taken, please call.

## 2021-09-03 NOTE — Addendum Note (Signed)
Addended by: Darleen Crocker on: 09/03/2021 01:58 PM   Modules accepted: Orders

## 2021-09-03 NOTE — Telephone Encounter (Signed)
Called the patient's daughter and she states that she is just wanting clarification. In reviewing the chart, it appears that Dr Brett Fairy had referred to Dr Tat as a second opinion for the ? PD work up.  Based off that visit the recommended to DC requip and take carbidopa levodopa 25/100 QID.  I advised that I do see where early Dezh[place] the pt called their office to discuss medication causing GI upset and was advised that she could go down to 10/100 if she tolerated that better. I advised the daughter she will need to confirm with pt on what she decided to do but the preference was 25/100 QID.  Pt has upcoming apt with Hedwig Morton in July and we will follow up with how things are.  The daughter also mentioned that she followed with Dr Saintclair Halsted who ordered imaging and PT. She mentioned she every once in a while still has pain and discomfort in her neck and back area. Dr Saintclair Halsted had recommended she follow up with Dr Brett Fairy to see if she has a recommendation of a muscle relaxer that may be beneficial to try. She rarely uses the tramadol that is prescribed because she doesn't want to get a addicted. He was unsure if there was a muscle relaxer that would be a conflict with her carbidopa levodopa.  The daughter states that pt has not mentioned this much after her visit with Dr Saintclair Halsted but wanted to go ahead and ask her opinion and have something in place if she needs it. I will pass along to Dr Brett Fairy and see what she thinks.

## 2021-09-09 ENCOUNTER — Ambulatory Visit: Payer: PPO | Admitting: Podiatry

## 2021-09-09 ENCOUNTER — Encounter: Payer: Self-pay | Admitting: Podiatry

## 2021-09-09 DIAGNOSIS — B351 Tinea unguium: Secondary | ICD-10-CM | POA: Diagnosis not present

## 2021-09-09 DIAGNOSIS — M79675 Pain in left toe(s): Secondary | ICD-10-CM

## 2021-09-09 DIAGNOSIS — M79672 Pain in left foot: Secondary | ICD-10-CM

## 2021-09-09 DIAGNOSIS — L853 Xerosis cutis: Secondary | ICD-10-CM | POA: Diagnosis not present

## 2021-09-09 DIAGNOSIS — M79671 Pain in right foot: Secondary | ICD-10-CM | POA: Diagnosis not present

## 2021-09-09 DIAGNOSIS — M79674 Pain in right toe(s): Secondary | ICD-10-CM | POA: Diagnosis not present

## 2021-09-17 ENCOUNTER — Telehealth: Payer: Self-pay | Admitting: Pulmonary Disease

## 2021-09-17 NOTE — Telephone Encounter (Signed)
Spoke with the pt  She states Mandy Durham was $50  She wants something with a cheaper copay  Will route to pharm team to see if there is anything cheaper that would be comparable  Thanks!

## 2021-09-19 ENCOUNTER — Other Ambulatory Visit (HOSPITAL_COMMUNITY): Payer: Self-pay

## 2021-09-22 NOTE — Progress Notes (Deleted)
Subjective:   Mandy Durham is a 76 y.o. female who presents for Medicare Annual (Subsequent) preventive examination.  I connected with Mandy Durham today by telephone and verified that I am speaking with the correct person using two identifiers. Location patient: home Location provider: work Persons participating in the virtual visit: patient, Marine scientist.    I discussed the limitations, risks, security and privacy concerns of performing an evaluation and management service by telephone and the availability of in person appointments. I also discussed with the patient that there may be a patient responsible charge related to this service. The patient expressed understanding and verbally consented to this telephonic visit.    Interactive audio and video telecommunications were attempted between this provider and patient, however failed, due to patient having technical difficulties OR patient did not have access to video capability.  We continued and completed visit with audio only.  Some vital signs may be absent or patient reported.   Time Spent with patient on telephone encounter: *** minutes   Review of Systems    ***       Objective:    There were no vitals filed for this visit. There is no height or weight on file to calculate BMI.     07/30/2021    8:41 AM 04/30/2021    5:04 PM 03/04/2021   11:52 AM 02/28/2021    8:07 AM 11/06/2020    2:08 PM 11/14/2019    2:41 PM 10/27/2016    2:11 PM  Advanced Directives  Does Patient Have a Medical Advance Directive? No No No No No No No  Would patient like information on creating a medical advance directive?  No - Patient declined No - Patient declined No - Patient declined No - Patient declined No - Patient declined     Current Medications (verified) Outpatient Encounter Medications as of 09/22/2021  Medication Sig   acetaminophen (TYLENOL) 500 MG tablet Take 500 mg by mouth every 6 (six) hours as needed.   aspirin EC 81 MG tablet Take 81 mg by  mouth daily. Swallow whole. (Patient not taking: Reported on 08/26/2021)   Baclofen 5 MG TABS Take 5 mg by mouth 2 (two) times daily as needed (pain in back).   Budeson-Glycopyrrol-Formoterol (BREZTRI AEROSPHERE) 160-9-4.8 MCG/ACT AERO Inhale 2 puffs into the lungs in the morning and at bedtime.   carbidopa-levodopa (SINEMET IR) 25-100 MG tablet Take 1 tablet by mouth 4 times daily at 8am/noon/4pm/8pm   Cholecalciferol (VITAMIN D PO) Take 1 tablet by mouth 2 (two) times a week. Vitamin D (Patient not taking: Reported on 08/26/2021)   escitalopram (LEXAPRO) 10 MG tablet Take 1 tablet (10 mg total) by mouth daily.   ezetimibe (ZETIA) 10 MG tablet Take 1 tablet (10 mg total) by mouth daily.   furosemide (LASIX) 20 MG tablet Take 1 tablet (20 mg total) by mouth daily. Alternating 40 mg every other day   losartan (COZAAR) 100 MG tablet Take 1 tablet (100 mg total) by mouth daily.   nitroGLYCERIN (NITROSTAT) 0.4 MG SL tablet Place 1 tablet (0.4 mg total) under the tongue every 5 (five) minutes x 3 doses as needed for chest pain.   potassium chloride (KLOR-CON) 10 MEQ tablet Take 1 tablet (10 mEq total) by mouth daily.   traMADol (ULTRAM) 50 MG tablet Take 50 mg by mouth every 6 (six) hours as needed.   No facility-administered encounter medications on file as of 09/22/2021.    Allergies (verified) Patient has no known allergies.  History: Past Medical History:  Diagnosis Date   Anxiety state, unspecified    Hyperlipidemia    Hypertension    Insomnia    LBBB (left bundle branch block)    LHC in 2002 showed normal coronaries.    Osteoarthrosis, unspecified whether generalized or localized, unspecified site    Overweight(278.02)    Parkinson disease ([place])    S/P placement of cardiac pacemaker    a. 02/21/16: Medtronic Advisa DR MRI SureScan model [id number] (serial number [id number])    Serrated adenoma of colon 2007   Symptomatic menopausal or female climacteric states    Syncope and collapse     11/12: Holter 12/12 with rare PACS, HR range 64-140, average 82, no significant arrhythmias. Echo (1/13): EF 50-55%, mild LVH, septal-lateral dyssynchrony c/w LBBB. 3-week event monitor (1/13): No significant arrhythmia.    Past Surgical History:  Procedure Laterality Date   COLONOSCOPY  2007   EP IMPLANTABLE DEVICE N/A 02/21/2016   Procedure: Pacemaker Implant;  Surgeon: Will Meredith Leeds, MD;  Location: [facility];  Service: Cardiovascular;  Laterality: N/A;   POLYPECTOMY     Family History  Problem Relation Age of Onset   Heart failure Mother    Coronary artery disease Mother    Dementia Mother    Diabetes Mother    Colon cancer Neg Hx    Breast cancer Neg Hx    Hypertension Neg Hx    Heart disease Neg Hx    Heart attack Neg Hx    Stroke Neg Hx    Rectal cancer Neg Hx    Stomach cancer Neg Hx    Esophageal cancer Neg Hx    Liver disease Neg Hx    Social History   Socioeconomic History   Marital status: Married    Spouse name: Mandy Durham   Number of children: 2   Years of education: 12   Highest education level: 12th grade  Occupational History   Occupation: retired Environmental consultant: [employer]  Tobacco Use   Smoking status: Never    Passive exposure: Never   Smokeless tobacco: Never  Vaping Use   Vaping Use: Never used  Substance and Sexual Activity   Alcohol use: No   Drug use: No   Sexual activity: Not Currently  Other Topics Concern   Not on file  Social History Narrative   HSG.  Married '65.     2 daughters, '71, '77; 4 grandchildren   Lives with husband   Right handed   Caffeine: half -1 cup a coffee a day         Social Determinants of Health   Financial Resource Strain: Low Risk  (04/30/2021)   Overall Financial Resource Strain (CARDIA)    Difficulty of Paying Living Expenses: Not hard at all  Food Insecurity: No Food Insecurity (04/30/2021)   Hunger Vital Sign    Worried About Running Out of Food in the  Last Year: Never true    Ran Out of Food in the Last Year: Never true  Transportation Needs: No Transportation Needs (04/30/2021)   PRAPARE - Hydrologist (Medical): No    Lack of Transportation (Non-Medical): No  Physical Activity: Inactive (04/30/2021)   Exercise Vital Sign    Days of Exercise per Week: 0 days    Minutes of Exercise per Session: 0 min  Stress: Stress Concern Present (04/30/2021)   [place]  Stress Questionnaire    Feeling of Stress : Rather much  Social Connections: Moderately Integrated (04/30/2021)   Social Connection and Isolation Panel [NHANES]    Frequency of Communication with Friends and Family: More than three times a week    Frequency of Social Gatherings with Friends and Family: Three times a week    Attends Religious Services: 1 to 4 times per year    Active Member of Clubs or Organizations: No    Attends Archivist Meetings: Never    Marital Status: Married    Tobacco Counseling Counseling given: Not Answered   Clinical Intake:                 Diabetic?No         Activities of Daily Living    04/30/2021    4:59 PM 01/22/2021    3:48 PM  In your present state of health, do you have any difficulty performing the following activities:  Hearing? 0 1  Vision? 0 0  Difficulty concentrating or making decisions? 1 1  Comment Vascular Parkinsonism   Walking or climbing stairs? 1 1  Comment Vertigo due to Brain Injury   Dressing or bathing? 1 0  Comment Chronic Fatigue, Unsteady Gait/Balance   Doing errands, shopping? 1 0  Comment Requires Assistance   Preparing Food and eating ? N   Using the Toilet? N   In the past six months, have you accidently leaked urine? N   Do you have problems with loss of bowel control? N   Managing your Medications? N   Managing your Finances? N   Housekeeping or managing your Housekeeping? N     Patient Care Team: Colon Branch, MD as PCP - General (Internal Medicine) Constance Haw, MD as PCP - Electrophysiology (Cardiology) Ladene Artist, MD (Gastroenterology) Thalia Bloodgood, [place] as Referring Physician (Optometry) Larey Dresser, MD (Cardiology) Arvella Nigh, MD (Obstetrics and Gynecology) Elayne Snare, MD (Endocrinology) Lyndal Pulley, DO as Attending Physician (Family Medicine)  Indicate any recent Medical Services you may have received from other than [facility] providers in the past year (date may be approximate).     Assessment:   This is a routine wellness examination for [place].  Hearing/Vision screen No results found.  Dietary issues and exercise activities discussed:     Goals Addressed   None    Depression Screen    04/30/2021    4:55 PM 08/28/2020    3:53 PM 06/07/2020   10:20 AM 03/06/2020    3:28 PM 10/18/2018    2:04 PM 08/07/2016    2:47 PM 06/24/2015    1:12 PM  PHQ 2/9 Scores  PHQ - 2 Score 1 3 0 0 0 6 0  PHQ- 9 Score  '10 8  4 13     '$ Fall Risk    07/30/2021    8:41 AM 04/30/2021    4:58 PM 08/28/2020    3:08 PM 06/07/2020    9:20 AM 03/06/2020    3:22 PM  Fall Risk   Falls in the past year? 0 1 0 0 0  Number falls in past yr: 0 1 0 0 0  Injury with Fall? 0 0 0 0 0  Risk for fall due to :  History of fall(s);Impaired balance/gait;Impaired mobility;Mental status change     Follow up  Falls evaluation completed;Education provided;Falls prevention discussed Falls evaluation completed      FALL RISK PREVENTION PERTAINING TO THE HOME:  Any stairs in or around the home? {YES/NO:21197} If so, are there any without handrails? {YES/NO:21197} Home free of loose throw rugs in walkways, pet beds, electrical cords, etc? {YES/NO:21197} Adequate lighting in your home to reduce risk of falls? {YES/NO:21197}  ASSISTIVE DEVICES UTILIZED TO PREVENT FALLS:  Life alert? {YES/NO:21197} Use of a cane, walker or w/c? {YES/NO:21197} Grab bars in the bathroom? {YES/NO:21197} Shower  chair or bench in shower? {YES/NO:21197} Elevated toilet seat or a handicapped toilet? {YES/NO:21197}  TIMED UP AND GO:  Was the test performed? {YES/NO:21197}.  Length of time to ambulate 10 feet: *** sec.   {Appearance of WFUX:3235573}  Cognitive Function:        Immunizations Immunization History  Administered Date(s) Administered   Fluad Quad(high Dose 65+) 12/14/2018, 03/06/2020, 01/22/2021   Influenza Split 12/10/2011   Influenza Whole 01/17/2007, 12/18/2008   Influenza, High Dose Seasonal PF 12/19/2014, 01/13/2016, 12/31/2017, 01/20/2018   Influenza,inj,Quad PF,6+ Mos 12/05/2012, 11/14/2013   Moderna Sars-Covid-2 Vaccination 04/20/2019, 05/16/2019, 02/09/2020   Pneumococcal Conjugate-13 09/07/2014   Pneumococcal Polysaccharide-23 11/02/2012   Td 06/17/2004   Tdap 02/12/2011    TDAP status: Due, Education has been provided regarding the importance of this vaccine. Advised may receive this vaccine at local pharmacy or Health Dept. Aware to provide a copy of the vaccination record if obtained from local pharmacy or Health Dept. Verbalized acceptance and understanding.  Flu Vaccine status: Up to date  Pneumococcal vaccine status: Up to date  Covid-19 vaccine status: Information provided on how to obtain vaccines.   Qualifies for Shingles Vaccine? Yes   Zostavax completed No   Shingrix Completed?: No.    Education has been provided regarding the importance of this vaccine. Patient has been advised to call insurance company to determine out of pocket expense if they have not yet received this vaccine. Advised may also receive vaccine at local pharmacy or Health Dept. Verbalized acceptance and understanding.  Screening Tests Health Maintenance  Topic Date Due   Zoster Vaccines- Shingrix (1 of 2) Never done   COVID-19 Vaccine (4 - Booster for Moderna series) 04/05/2020   TETANUS/TDAP  02/11/2021   MAMMOGRAM  05/27/2022 (Originally 05/03/2020)   INFLUENZA VACCINE   10/14/2021   Pneumonia Vaccine 19+ Years old  Completed   DEXA SCAN  Completed   Hepatitis C Screening  Completed   HPV VACCINES  Aged Out   COLONOSCOPY (Pts 45-22yr Insurance coverage will need to be confirmed)  Discontinued    Health Maintenance  Health Maintenance Due  Topic Date Due   Zoster Vaccines- Shingrix (1 of 2) Never done   COVID-19 Vaccine (4 - Booster for Moderna series) 04/05/2020   TETANUS/TDAP  02/11/2021    {Colorectal cancer screening:2101809}  {Mammogram status:21018020}  {Bone Density status:21018021}  Lung Cancer Screening: (Low Dose CT Chest recommended if Age 53-80 years, 30 pack-year currently smoking OR have quit w/in 15years.) does not qualify.     Additional Screening:  Hepatitis C Screening: Completed 07/22/2015  Vision Screening: Recommended annual ophthalmology exams for early detection of glaucoma and other disorders of the eye. Is the patient up to date with their annual eye exam?  {YES/NO:21197} Who is the provider or what is the name of the office in which the patient attends annual eye exams? *** If pt is not established with a provider, would they like to be referred to a provider to establish care? {YES/NO:21197}.   Dental Screening: Recommended annual dental exams for proper oral hygiene  Community Resource Referral /  Chronic Care Management: CRR required this visit?  {YES/NO:21197}  CCM required this visit?  {YES/NO:21197}     Plan:     I have personally reviewed and noted the following in the patient's chart:   Medical and social history Use of alcohol, tobacco or illicit drugs  Current medications and supplements including opioid prescriptions.  Functional ability and status Nutritional status Physical activity Advanced directives List of other physicians Hospitalizations, surgeries, and ER visits in previous 12 months Vitals Screenings to include cognitive, depression, and falls Referrals and appointments  In  addition, I have reviewed and discussed with patient certain preventive protocols, quality metrics, and best practice recommendations. A written personalized care plan for preventive services as well as general preventive health recommendations were provided to patient.   Due to this being a telephonic visit, the after visit summary with patients personalized plan was offered to patient via mail or my-chart. Patient to pick up at office at next visit.   Marta Antu, LPN   [id number]/[id number]  Nurse Health Advisor  Nurse Notes: ***

## 2021-09-25 NOTE — Telephone Encounter (Signed)
Linna Darner, [employer] to Me      09/19/21 11:45 AM Good Afternoon! I ran a test claim for Trelegy (only other ICS+LAMA+LABA) and the patient's copay is $45.   Dr Silas Flood, are you ok if she wants to try trelegy? It has a slightly cheaper copay.

## 2021-09-25 NOTE — Telephone Encounter (Signed)
Yes that is fine with me - thanks!

## 2021-09-25 NOTE — Telephone Encounter (Signed)
Called and left voicemail for patient to call office back in regards to inhaler

## 2021-09-30 ENCOUNTER — Telehealth: Payer: Self-pay | Admitting: Neurology

## 2021-09-30 NOTE — Telephone Encounter (Signed)
Pt said she wants to talk to a nurse about getting some pain medication. Pt is requesting a return call

## 2021-09-30 NOTE — Telephone Encounter (Signed)
Reviewed pt chart. Last seen 06/09/21 and next f/u 10/13/21 with MM,NP. Dr. Brett Fairy rx'd baclofen '5mg'$  po BID 09/03/21 for pain in back. Rx tramadol '50mg'$  on pt active med list. Checked drug registry. This was rx'd by [facility],NP/[facility] last 05/20/21 #28.   Called pt back at [phone]. She is having neck pain. States baclofen and tramadol ineffective. Requesting pain medication. Advised we do not write long term pain meds. Saw Neurosurgery back in May 2023. I recommended she call Dr. Saintclair Halsted office about setting up appt asap for re-evaluation/treatment.   She would still like to know if Dr. Brett Fairy is able to offer a medication to help w/ pain in the meantime. Aware I will speak with MD and call back latest tomorrow morning w/ recommendation.

## 2021-09-30 NOTE — Telephone Encounter (Signed)
Called the patient back to advise her that we did talk with Dr Dohmeier who doesn't feel comfortable with ordering pain medication. She would recommend that the patient reach out to neurosurgery since they are who prescribe her pain medication and the pain is associated with her neck.   Attempted to call pt twice. It sounded like someone picked up the phone but could not hear them on the other end,   **If pt calls back please advise that we did ask Dr. Brett Fairy and she will not write for pain medication. Pt should contact her neurosurgeon (as mentioned above)

## 2021-10-12 NOTE — Progress Notes (Unsigned)
PATIENT: Mandy Durham DOB: [birth year]  REASON FOR VISIT: follow up HISTORY FROM: patient PRIMARY NEUROLOGIST: Dr. Brett Fairy  Chief Complaint  Patient presents with   Follow-up    Pt in 18  pt here for neuropathy f/u  pt states she has pain her neck Pt has involuntary movement and unable to get words out  Pt states feet like she has bubbles in them     HISTORY OF PRESENT ILLNESS: Today 10/12/21:  [name] is a 76 year old female with a history of parkinson's disease. She returns today for follow-up. Saw Dr. Carles Collet in May and she made the recommendation to change to sinemet 25-100 QID and to stop requip. Reports that Dyskinesias has gotten worse. Trouble speaking, moving shoulders and lips. She was given baclofen 5 mg BID and found it beneficial but feels that the dosage needs to be higher. Using it for neck pain.  She reports right now she feels that she has no quality of life.  When she saw Dr. Carles Collet there was some concern about possible MSA due to the patient's symptoms of diplopia and low-dose dyskinesia.  At that time Sinemet was increased to 25-100 mg 4 times a day.  REVIEW OF SYSTEMS: Out of a complete 14 system review of symptoms, the patient complains only of the following symptoms, and all other reviewed systems are negative.  ALLERGIES: No Known Allergies  HOME MEDICATIONS: Outpatient Medications Prior to Visit  Medication Sig Dispense Refill   acetaminophen (TYLENOL) 500 MG tablet Take 500 mg by mouth every 6 (six) hours as needed.     aspirin EC 81 MG tablet Take 81 mg by mouth daily. Swallow whole. (Patient not taking: Reported on 08/26/2021)     Baclofen 5 MG TABS Take 5 mg by mouth 2 (two) times daily as needed (pain in back). 60 tablet 5   Budeson-Glycopyrrol-Formoterol (BREZTRI AEROSPHERE) 160-9-4.8 MCG/ACT AERO Inhale 2 puffs into the lungs in the morning and at bedtime. 10.7 g 11   carbidopa-levodopa (SINEMET IR) 25-100 MG tablet Take 1 tablet by mouth 4 times  daily at 8am/noon/4pm/8pm 360 tablet 1   Cholecalciferol (VITAMIN D PO) Take 1 tablet by mouth 2 (two) times a week. Vitamin D (Patient not taking: Reported on 08/26/2021)     escitalopram (LEXAPRO) 10 MG tablet Take 1 tablet (10 mg total) by mouth daily. 30 tablet 6   ezetimibe (ZETIA) 10 MG tablet Take 1 tablet (10 mg total) by mouth daily. 90 tablet 1   furosemide (LASIX) 20 MG tablet Take 1 tablet (20 mg total) by mouth daily. Alternating 40 mg every other day 90 tablet 3   losartan (COZAAR) 100 MG tablet Take 1 tablet (100 mg total) by mouth daily. 30 tablet 3   nitroGLYCERIN (NITROSTAT) 0.4 MG SL tablet Place 1 tablet (0.4 mg total) under the tongue every 5 (five) minutes x 3 doses as needed for chest pain. 25 tablet 1   potassium chloride (KLOR-CON) 10 MEQ tablet Take 1 tablet (10 mEq total) by mouth daily. 90 tablet 3   traMADol (ULTRAM) 50 MG tablet Take 50 mg by mouth every 6 (six) hours as needed.     No facility-administered medications prior to visit.    PAST MEDICAL HISTORY: Past Medical History:  Diagnosis Date   Anxiety state, unspecified    Hyperlipidemia    Hypertension    Insomnia    LBBB (left bundle branch block)    LHC in 2002 showed normal coronaries.  Osteoarthrosis, unspecified whether generalized or localized, unspecified site    Overweight(278.02)    Parkinson disease ([place])    S/P placement of cardiac pacemaker    a. 02/21/16: Medtronic Advisa DR MRI SureScan model [id number] (serial number [id number])    Serrated adenoma of colon 2007   Symptomatic menopausal or female climacteric states    Syncope and collapse    11/12: Holter 12/12 with rare PACS, HR range 64-140, average 82, no significant arrhythmias. Echo (1/13): EF 50-55%, mild LVH, septal-lateral dyssynchrony c/w LBBB. 3-week event monitor (1/13): No significant arrhythmia.     PAST SURGICAL HISTORY: Past Surgical History:  Procedure Laterality Date   COLONOSCOPY  2007   EP IMPLANTABLE DEVICE N/A  02/21/2016   Procedure: Pacemaker Implant;  Surgeon: Will Meredith Leeds, MD;  Location: [facility];  Service: Cardiovascular;  Laterality: N/A;   POLYPECTOMY      FAMILY HISTORY: Family History  Problem Relation Age of Onset   Heart failure Mother    Coronary artery disease Mother    Dementia Mother    Diabetes Mother    Colon cancer Neg Hx    Breast cancer Neg Hx    Hypertension Neg Hx    Heart disease Neg Hx    Heart attack Neg Hx    Stroke Neg Hx    Rectal cancer Neg Hx    Stomach cancer Neg Hx    Esophageal cancer Neg Hx    Liver disease Neg Hx     SOCIAL HISTORY: Social History   Socioeconomic History   Marital status: Married    Spouse name: Eddie   Number of children: 2   Years of education: 12   Highest education level: 12th grade  Occupational History   Occupation: retired Environmental consultant: [employer]  Tobacco Use   Smoking status: Never    Passive exposure: Never   Smokeless tobacco: Never  Vaping Use   Vaping Use: Never used  Substance and Sexual Activity   Alcohol use: No   Drug use: No   Sexual activity: Not Currently  Other Topics Concern   Not on file  Social History Narrative   HSG.  Married '65.     2 daughters, '71, '77; 4 grandchildren   Lives with husband   Right handed   Caffeine: half -1 cup a coffee a day         Social Determinants of Health   Financial Resource Strain: Low Risk  (04/30/2021)   Overall Financial Resource Strain (CARDIA)    Difficulty of Paying Living Expenses: Not hard at all  Food Insecurity: No Food Insecurity (04/30/2021)   Hunger Vital Sign    Worried About Running Out of Food in the Last Year: Never true    Ran Out of Food in the Last Year: Never true  Transportation Needs: No Transportation Needs (04/30/2021)   PRAPARE - Hydrologist (Medical): No    Lack of Transportation (Non-Medical): No  Physical Activity: Inactive (04/30/2021)    Exercise Vital Sign    Days of Exercise per Week: 0 days    Minutes of Exercise per Session: 0 min  Stress: Stress Concern Present (04/30/2021)   [place]    Feeling of Stress : Rather much  Social Connections: Moderately Integrated (04/30/2021)   Social Connection and Isolation Panel [NHANES]    Frequency of Communication with Friends and  Family: More than three times a week    Frequency of Social Gatherings with Friends and Family: Three times a week    Attends Religious Services: 1 to 4 times per year    Active Member of Clubs or Organizations: No    Attends Archivist Meetings: Never    Marital Status: Married  Human resources officer Violence: Not At Risk (04/30/2021)   Humiliation, Afraid, Rape, and Kick questionnaire    Fear of Current or Ex-Partner: No    Emotionally Abused: No    Physically Abused: No    Sexually Abused: No      PHYSICAL EXAM  Vitals:   10/13/21 1401  BP: 116/63  Pulse: 75  Weight: 106 lb 3.2 oz (48.2 kg)  Height: '5\' 5"'$  (1.651 m)   Body mass index is 17.67 kg/m.  Generalized: Well developed, in no acute distress   Neurological examination  Mentation: Alert oriented to time, place, history taking. Follows all commands speech and language fluent Cranial nerve II-XII: Pupils were equal round reactive to light. Extraocular movements were full, visual field were full on confrontational test. Facial sensation and strength were normal. Uvula tongue midline. Head turning and shoulder shrug  were normal and symmetric. Motor: The motor testing reveals 5 over 5 strength of all 4 extremities.  Increased tone in the left upper extremity.  Left shoulder dyskinesia. Sensory: Sensory testing is intact to soft touch on all 4 extremities. No evidence of extinction is noted.  Coordination: Cerebellar testing reveals good finger-nose-finger and heel-to-shin bilaterally.  Gait and station: Uses the  chair to push to stand up.  Good stride with decreased arm swing.   DIAGNOSTIC DATA (LABS, IMAGING, TESTING) - I reviewed patient records, labs, notes, testing and imaging myself where available.  Lab Results  Component Value Date   WBC 7.1 06/09/2021   HGB 13.2 06/09/2021   HCT 40.5 06/09/2021   MCV 88 06/09/2021   PLT 293 06/09/2021      Component Value Date/Time   NA 138 08/26/2021 0000   K 3.7 08/26/2021 0000   CL 102 08/26/2021 0000   CO2 22 08/26/2021 0000   GLUCOSE 109 (H) 08/26/2021 0000   GLUCOSE 119 (H) 01/22/2021 1625   BUN 19 08/26/2021 0000   CREATININE 0.82 08/26/2021 0000   CREATININE 0.85 06/07/2020 1030   CALCIUM 9.0 08/26/2021 0000   PROT 6.3 06/09/2021 1450   ALBUMIN 4.2 06/09/2021 1450   AST 17 06/09/2021 1450   ALT 7 06/09/2021 1450   ALKPHOS 101 06/09/2021 1450   BILITOT 0.3 06/09/2021 1450   GFRNONAA 81 12/04/2019 1540   GFRAA 93 12/04/2019 1540   Lab Results  Component Value Date   CHOL 200 05/26/2021   HDL 108.70 05/26/2021   LDLCALC 76 05/26/2021   LDLDIRECT 106.9 11/03/2012   TRIG 75.0 05/26/2021   CHOLHDL 2 05/26/2021   Lab Results  Component Value Date   HGBA1C 6.1 (H) 06/09/2021   Lab Results  Component Value Date   VITAMINB12 290 06/09/2021   Lab Results  Component Value Date   TSH 1.050 06/09/2021      ASSESSMENT AND PLAN 76 y.o. year old female  has a past medical history of Anxiety state, unspecified, Hyperlipidemia, Hypertension, Insomnia, LBBB (left bundle branch block), Osteoarthrosis, unspecified whether generalized or localized, unspecified site, Overweight(278.02), Parkinson disease ([place]), S/P placement of cardiac pacemaker, Serrated adenoma of colon (2007), Symptomatic menopausal or female climacteric states, and Syncope and collapse. here with:  1.  Parkinson's disease  -We will do a trial on reduced dose of Sinemet she will try reducing her dose to 25-100 mg 3 times a day.  Advised that if dyskinesias do not  improve or symptoms worsen she can increase back to 4 times a day -We will continue to monitor symptoms. -Her next follow-up will be with Dr. Brett Fairy for evaluation  2.  Cervical dystonia  -Increase baclofen to 10 mg twice a day.  Discussed potential side effects with the patient.  She verbalized understanding  Follow-up in 2-3 months with Dr. Mechele Claude, MSN, NP-C 10/12/2021, 4:07 PM [facility] [address] [place], [place] [address] [phone]

## 2021-10-13 ENCOUNTER — Encounter: Payer: Self-pay | Admitting: Adult Health

## 2021-10-13 ENCOUNTER — Ambulatory Visit: Payer: PPO | Admitting: Adult Health

## 2021-10-13 VITALS — BP 116/63 | HR 75 | Ht 65.0 in | Wt 106.2 lb

## 2021-10-13 DIAGNOSIS — G20 Parkinson's disease: Secondary | ICD-10-CM

## 2021-10-13 DIAGNOSIS — G243 Spasmodic torticollis: Secondary | ICD-10-CM

## 2021-10-13 DIAGNOSIS — G249 Dystonia, unspecified: Secondary | ICD-10-CM | POA: Diagnosis not present

## 2021-10-13 MED ORDER — BACLOFEN 10 MG PO TABS
10.0000 mg | ORAL_TABLET | Freq: Two times a day (BID) | ORAL | 5 refills | Status: DC | PRN
Start: 2021-10-13 — End: 2022-05-29

## 2021-10-13 NOTE — Patient Instructions (Signed)
Your Plan:  Increase Baclofen to 10 mg twice a day- new prescription sent in Reduce dose of sinemet to 1 tablet three times a day If your symptoms worsen or you develop new symptoms please let us know.    Thank you for coming to see [place] at [facility]. I hope we have been able to provide you high quality care today.  You may receive a patient satisfaction survey over the next few weeks. We would appreciate your feedback and comments so that we may continue to improve ourselves and the health of our patients.

## 2021-10-24 ENCOUNTER — Ambulatory Visit (INDEPENDENT_AMBULATORY_CARE_PROVIDER_SITE_OTHER): Payer: PPO

## 2021-10-24 DIAGNOSIS — I5032 Chronic diastolic (congestive) heart failure: Secondary | ICD-10-CM

## 2021-10-27 ENCOUNTER — Encounter: Payer: Self-pay | Admitting: Internal Medicine

## 2021-10-27 ENCOUNTER — Ambulatory Visit (INDEPENDENT_AMBULATORY_CARE_PROVIDER_SITE_OTHER): Payer: PPO | Admitting: Internal Medicine

## 2021-10-27 ENCOUNTER — Telehealth: Payer: Self-pay

## 2021-10-27 VITALS — BP 136/68 | HR 55 | Temp 98.1°F | Resp 18 | Ht 65.0 in | Wt 105.1 lb

## 2021-10-27 DIAGNOSIS — I5032 Chronic diastolic (congestive) heart failure: Secondary | ICD-10-CM

## 2021-10-27 DIAGNOSIS — G214 Vascular parkinsonism: Secondary | ICD-10-CM

## 2021-10-27 DIAGNOSIS — R627 Adult failure to thrive: Secondary | ICD-10-CM

## 2021-10-27 DIAGNOSIS — F419 Anxiety disorder, unspecified: Secondary | ICD-10-CM | POA: Diagnosis not present

## 2021-10-27 DIAGNOSIS — G4700 Insomnia, unspecified: Secondary | ICD-10-CM | POA: Diagnosis not present

## 2021-10-27 LAB — CUP PACEART REMOTE DEVICE CHECK
Battery Remaining Longevity: 38 mo
Battery Voltage: 2.98 V
Brady Statistic AP VP Percent: 6.84 %
Brady Statistic AP VS Percent: 0.04 %
Brady Statistic AS VP Percent: 93.07 %
Brady Statistic AS VS Percent: 0.05 %
Brady Statistic RA Percent Paced: 6.74 %
Brady Statistic RV Percent Paced: 99.9 %
Date Time Interrogation Session: 20230811124747
Implantable Lead Implant Date: 20171208
Implantable Lead Implant Date: 20171208
Implantable Lead Location: 753859
Implantable Lead Location: 753860
Implantable Lead Model: 5076
Implantable Lead Model: 5076
Implantable Pulse Generator Implant Date: 20171208
Lead Channel Impedance Value: 323 Ohm
Lead Channel Impedance Value: 380 Ohm
Lead Channel Impedance Value: 418 Ohm
Lead Channel Impedance Value: 494 Ohm
Lead Channel Pacing Threshold Amplitude: 0.5 V
Lead Channel Pacing Threshold Amplitude: 0.625 V
Lead Channel Pacing Threshold Pulse Width: 0.4 ms
Lead Channel Pacing Threshold Pulse Width: 0.4 ms
Lead Channel Sensing Intrinsic Amplitude: 2.25 mV
Lead Channel Sensing Intrinsic Amplitude: 2.25 mV
Lead Channel Sensing Intrinsic Amplitude: 8.5 mV
Lead Channel Sensing Intrinsic Amplitude: 8.5 mV
Lead Channel Setting Pacing Amplitude: 2 V
Lead Channel Setting Pacing Amplitude: 2.5 V
Lead Channel Setting Pacing Pulse Width: 0.4 ms
Lead Channel Setting Sensing Sensitivity: 4 mV

## 2021-10-27 MED ORDER — LOSARTAN POTASSIUM 100 MG PO TABS: 100.0000 mg | ORAL_TABLET | Freq: Every day | ORAL | 1 refills | Status: DC

## 2021-10-27 MED ORDER — ESCITALOPRAM OXALATE 20 MG PO TABS: 20.0000 mg | ORAL_TABLET | Freq: Every day | ORAL | 1 refills | Status: DC

## 2021-10-27 MED ORDER — SHINGRIX 50 MCG/0.5ML IM SUSR: 0.5000 mL | Freq: Once | INTRAMUSCULAR | 1 refills | Status: AC

## 2021-10-27 MED ORDER — TETANUS-DIPHTH-ACELL PERTUSSIS 5-2.5-18.5 LF-MCG/0.5 IM SUSP: 0.5000 mL | Freq: Once | INTRAMUSCULAR | 0 refills | Status: AC

## 2021-10-27 NOTE — Telephone Encounter (Signed)
Attempted to contact patient to schedule a Palliative Care consult appointment. No answer left a message to return call.  

## 2021-10-27 NOTE — Progress Notes (Signed)
Subjective:    Patient ID: Mandy Durham, female    DOB: 11-20-1945, 76 y.o.   MRN: [id number]  DOS:  10/27/2021 Type of visit - description: f/u  Here for follow-up, unfortunately she is not doing well emotionally. Reports no energy. She feels depressed but denies suicidal ideas. Due to a combination of depression and physical issues she is not cleaning  the house, not interested in cooking, sometimes forgetting to take her medications.  Wt Readings from Last 3 Encounters:  10/27/21 105 lb 2 oz (47.7 kg)  10/13/21 106 lb 3.2 oz (48.2 kg)  08/26/21 109 lb (49.4 kg)   Review of Systems See above   Past Medical History:  Diagnosis Date   Anxiety state, unspecified    Hyperlipidemia    Hypertension    Insomnia    LBBB (left bundle branch block)    LHC in 2002 showed normal coronaries.    Osteoarthrosis, unspecified whether generalized or localized, unspecified site    Overweight(278.02)    Parkinson disease ([place])    S/P placement of cardiac pacemaker    a. 02/21/16: Medtronic Advisa DR MRI SureScan model [id number] (serial number [id number])    Serrated adenoma of colon 2007   Symptomatic menopausal or female climacteric states    Syncope and collapse    11/12: Holter 12/12 with rare PACS, HR range 64-140, average 82, no significant arrhythmias. Echo (1/13): EF 50-55%, mild LVH, septal-lateral dyssynchrony c/w LBBB. 3-week event monitor (1/13): No significant arrhythmia.     Past Surgical History:  Procedure Laterality Date   COLONOSCOPY  2007   EP IMPLANTABLE DEVICE N/A 02/21/2016   Procedure: Pacemaker Implant;  Surgeon: Will Meredith Leeds, MD;  Location: [facility];  Service: Cardiovascular;  Laterality: N/A;   POLYPECTOMY      Current Outpatient Medications  Medication Instructions   acetaminophen (TYLENOL) 500 mg, Oral, Every 6 hours PRN   aspirin EC 81 mg, Oral, Daily, Swallow whole.    baclofen (LIORESAL) 10 mg, Oral, 2 times daily PRN    Budeson-Glycopyrrol-Formoterol (BREZTRI AEROSPHERE) 160-9-4.8 MCG/ACT AERO 2 puffs, Inhalation, 2 times daily   carbidopa-levodopa (SINEMET IR) 25-100 MG tablet Take 1 tablet by mouth 4 times daily at 8am/noon/4pm/8pm   Cholecalciferol (VITAMIN D PO) 1 tablet, Oral, 2 times weekly, Vitamin D   escitalopram (LEXAPRO) 10 mg, Oral, Daily   ezetimibe (ZETIA) 10 mg, Oral, Daily   furosemide (LASIX) 20 mg, Oral, Daily, Alternating 40 mg every other day   losartan (COZAAR) 100 mg, Oral, Daily   nitroGLYCERIN (NITROSTAT) 0.4 mg, Sublingual, Every 5 min x3 PRN   potassium chloride (KLOR-CON) 10 MEQ tablet 10 mEq, Oral, Daily   Tdap (BOOSTRIX) 5-2.5-18.5 LF-MCG/0.5 injection 0.5 mLs, Intramuscular,  Once   traMADol (ULTRAM) 50 mg, Oral, Every 6 hours PRN   Zoster Vaccine Adjuvanted (SHINGRIX) injection 0.5 mLs, Intramuscular,  Once       Objective:   Physical Exam BP 136/68   Pulse (!) 55   Temp 98.1 F (36.7 C) (Oral)   Resp 18   Ht '5\' 5"'$  (1.651 m)   Wt 105 lb 2 oz (47.7 kg)   SpO2 97%   BMI 17.49 kg/m  General:   Well developed, NAD, BMI noted. HEENT:  Normocephalic . Face symmetric, atraumatic Lower extremities: no pretibial edema bilaterally  Skin: Not pale. Not jaundice Neurologic:  alert & oriented X3.  Speech: Slow, significant stuttering, motor consistent with history of parkinsonism and dystonia of the  C- spine. Psych--  Cognition and judgment appear intact.  Cooperative with normal attention span and concentration.  Behavior appropriate. Anxious > depressed appearing. Tearful    Assessment     Assessment Prediabetes  HTN : amlodipine: edema 10/2018 Hyperlipidemia, statin intolerant Anxiety, insomnia   Menopausal; DEXA 2012 -1.5 @ gyn Recurrent Syncope --3:1 AV Block pacemaker 02/21/16 --   DX with autonomic insufficiency ~ 02-2015 >>>on a  abd binder , pyridostigmine, rx midodrine 06-2015 per cards  --CT coronary morphology with CTA and coronary score 01/12/2018: No  significant disease - Diastolic CHF Traumatic SAH (after a syncope),TBI--- admitted 01-2015  -- was rx seroquel (aparently for ICU delirium) -- d/c 07-2015 --rx topamax when at rehab for [facility]  HAs -- had issues w/ b/b incontinence, better as of 07-2015 Tremors LUE, onset 9 -2017 Osteopneia: on Boniva, rx elsewhere DOE/CP: Durham risk Myoview, echo done 10/20/2019, saw cards 05/2020 and pulmonary 07/2020: multifactorial, PFTx ordered   PLAN Anxiety, depression, insomnia: Reports she is not doing well emotionally, 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  Her physical state is preventing her from doing the ADLs, she is also depressed, she was tearful today. Extensive listening therapy provided. She has 2 daughters, Mandy Durham Production designer, theatre/television/film) and Mandy Durham).  He is in very good terms with Mandy Durham. I advised her to start working on a  ALF (if financially possible) and in the meantime perhaps get some help few hours a week for laundry and housekeeping  chores. Will ask palliative care to see, she needs a Education officer, museum (Dx as above and FFT) Increase Lexapro from 10 mg to 20 mg. CHF Saw cardiology 08/26/2020, felt to be stable. Refill losartan. Sometimes forgets to take Lasix, encourage compliance.  To take K+ only if she takes Lasix.  She said she was aware of that. Parkinson, cervical dystonia LOV w/ neurology 10/13/2021  RTC 2 months

## 2021-10-27 NOTE — Assessment & Plan Note (Addendum)
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  Her physical state is preventing her from doing the ADLs, she is also depressed, she was tearful today. Extensive listening therapy provided. She has 2 daughters, [name], theatre/television/film) and [name]).  He is in very good terms with Christy. I advised her to start working on a  ALF (if financially possible) and in the meantime perhaps get some help few hours a week for laundry and housekeeping  chores. Will ask palliative care to see, she needs a Education officer, museum  (Dx as above and FFT) Increase Lexapro from 10 mg to 20 mg. CHF Saw cardiology 08/26/2020, felt to be stable. Refill losartan. Sometimes forgets to take Lasix, encourage compliance.  To take K+ only if she takes Lasix.  She said she was aware of that. Parkinson, cervical dystonia LOV w/ neurology 10/13/2021  RTC 2 months

## 2021-10-27 NOTE — Patient Instructions (Addendum)
Increase escitalopram 10 mg to escitalopram 20 mg: 1 tablet daily.  Please continue working with [name], your daughter,  see if you can get some help with your house chores.  Start looking into possible assisted living facilities.  Take your medications according to the list provided.  Recommend to proceed with the following vaccines at your pharmacy:  Shingrix (shingles) Tdap (tetanus)  Covid booster (bivalent) Flu shot this fall      [place], [employer] back for a checkup in 2 or 3 months

## 2021-10-31 ENCOUNTER — Telehealth: Payer: Self-pay

## 2021-10-31 NOTE — Telephone Encounter (Signed)
Spoke with patient regarding Palliative Care services. She requested a call back on Monday.

## 2021-11-03 ENCOUNTER — Telehealth: Payer: Self-pay

## 2021-11-03 NOTE — Telephone Encounter (Signed)
Attempted to contact patient to schedule a Palliative Care consult appointment. No answer and unable to leave a message voicemail is full.  

## 2021-11-18 NOTE — Progress Notes (Signed)
Remote pacemaker transmission.   

## 2021-11-19 ENCOUNTER — Encounter: Payer: Self-pay | Admitting: Podiatry

## 2021-11-19 ENCOUNTER — Ambulatory Visit: Payer: PPO | Admitting: Podiatry

## 2021-11-19 DIAGNOSIS — L853 Xerosis cutis: Secondary | ICD-10-CM

## 2021-11-19 DIAGNOSIS — M79675 Pain in left toe(s): Secondary | ICD-10-CM

## 2021-11-19 DIAGNOSIS — L84 Corns and callosities: Secondary | ICD-10-CM

## 2021-11-19 DIAGNOSIS — R7303 Prediabetes: Secondary | ICD-10-CM | POA: Diagnosis not present

## 2021-11-19 DIAGNOSIS — M79672 Pain in left foot: Secondary | ICD-10-CM

## 2021-11-19 DIAGNOSIS — B351 Tinea unguium: Secondary | ICD-10-CM

## 2021-11-19 DIAGNOSIS — M79671 Pain in right foot: Secondary | ICD-10-CM

## 2021-11-19 DIAGNOSIS — M79674 Pain in right toe(s): Secondary | ICD-10-CM | POA: Diagnosis not present

## 2021-11-19 NOTE — Progress Notes (Signed)
This patient returns to my office for at risk foot care.  This patient requires this care by a professional since this patient will be at risk due to having neuropathy.  This patient is unable to cut nails herself since the patient cannot reach her nails.These nails are painful walking and wearing shoes.  This patient presents for at risk foot care today.  General Appearance  Alert, conversant and in no acute stress.  Vascular  Dorsalis pedis and posterior tibial  pulses are palpable  bilaterally.  Capillary return is within normal limits  bilaterally. Temperature is within normal limits  bilaterally.  Neurologic  Senn-Weinstein monofilament wire test within normal limits  bilaterally. Muscle power within normal limits bilaterally.  Nails Thick disfigured discolored nails with subungual debris  from hallux to fifth toes bilaterally. No evidence of bacterial infection or drainage bilaterally.  Orthopedic  No limitations of motion  feet .  No crepitus or effusions noted.  No bony pathology or digital deformities noted.  Heloma durum fifth toe right foot.  Skin  normotropic skin with no porokeratosis noted bilaterally.  No signs of infections or ulcers noted.     Onychomycosis  Pain in right toes  Pain in left toes  Heloma durum  5th toe right.  Consent was obtained for treatment procedures.   Mechanical debridement of nails 1-5  bilaterally performed with a nail nipper.  Filed with dremel without incident.    Return office visit    10 weeks                  Told patient to return for periodic foot care and evaluation due to potential at risk complications.   Gardiner Barefoot DPM

## 2021-11-21 ENCOUNTER — Ambulatory Visit: Payer: PPO | Admitting: Pulmonary Disease

## 2021-12-02 ENCOUNTER — Telehealth: Payer: Self-pay | Admitting: Neurology

## 2021-12-02 ENCOUNTER — Encounter: Payer: Self-pay | Admitting: Internal Medicine

## 2021-12-02 ENCOUNTER — Ambulatory Visit (INDEPENDENT_AMBULATORY_CARE_PROVIDER_SITE_OTHER): Payer: PPO | Admitting: Internal Medicine

## 2021-12-02 VITALS — BP 132/80 | HR 93 | Temp 98.0°F | Resp 16 | Ht 65.0 in | Wt 104.0 lb

## 2021-12-02 DIAGNOSIS — G214 Vascular parkinsonism: Secondary | ICD-10-CM | POA: Diagnosis not present

## 2021-12-02 DIAGNOSIS — R627 Adult failure to thrive: Secondary | ICD-10-CM | POA: Diagnosis not present

## 2021-12-02 DIAGNOSIS — F8081 Childhood onset fluency disorder: Secondary | ICD-10-CM

## 2021-12-02 DIAGNOSIS — F419 Anxiety disorder, unspecified: Secondary | ICD-10-CM

## 2021-12-02 MED ORDER — ESCITALOPRAM OXALATE 10 MG PO TABS: 10.0000 mg | ORAL_TABLET | Freq: Every day | ORAL | Status: DC

## 2021-12-02 MED ORDER — AMANTADINE HCL 100 MG PO CAPS: 100.0000 mg | ORAL_CAPSULE | Freq: Two times a day (BID) | ORAL | 3 refills | Status: DC

## 2021-12-02 NOTE — Patient Instructions (Addendum)
Will ask palliative care to go visit you.  I will write your neurologist and see if something else can be done.   Vaccines I recommend:  Shingrix (shingles) Tdap (tetanus)  Covid booster RSV vaccine Flu shot

## 2021-12-02 NOTE — Telephone Encounter (Signed)
I had a brief conversation with Dr Larose Kells regarding Mandy Durham. He reached out to see if the patient could benefit from another medication, such as for dystonia and he reported her anxiety was extreme , limiting her ability to function. 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 Dr Larose Kells wants to involve APS and palliative care for him, as he  has not reached her by phone and family members are not helping.  He will discuss with her a low dose anxiolytic and I offered amantadine for tremor / dystonia control.   Amantadine has been ordered for bid intake.  Larey Seat, MD

## 2021-12-02 NOTE — Progress Notes (Unsigned)
Subjective:    Patient ID: Mandy Durham, female    DOB: 07-25-1945, 76 y.o.   MRN: [id number]  DOS:  12/02/2021 Type of visit - description: Acute  Patient is here to discuss her home situation, apparently is getting worse. We had extended conversation.  Review of Systems See above   Past Medical History:  Diagnosis Date   Anxiety state, unspecified    Hyperlipidemia    Hypertension    Insomnia    LBBB (left bundle branch block)    LHC in 2002 showed normal coronaries.    Osteoarthrosis, unspecified whether generalized or localized, unspecified site    Overweight(278.02)    Parkinson disease ([place])    S/P placement of cardiac pacemaker    a. 02/21/16: Medtronic Advisa DR MRI SureScan model D6LO75 (serial number [id number])    Serrated adenoma of colon 2007   Symptomatic menopausal or female climacteric states    Syncope and collapse    11/12: Holter 12/12 with rare PACS, HR range 64-140, average 82, no significant arrhythmias. Echo (1/13): EF 50-55%, mild LVH, septal-lateral dyssynchrony c/w LBBB. 3-week event monitor (1/13): No significant arrhythmia.     Past Surgical History:  Procedure Laterality Date   COLONOSCOPY  2007   EP IMPLANTABLE DEVICE N/A 02/21/2016   Procedure: Pacemaker Implant;  Surgeon: Will Meredith Leeds, MD;  Location: [facility];  Service: Cardiovascular;  Laterality: N/A;   POLYPECTOMY      Current Outpatient Medications  Medication Instructions   acetaminophen (TYLENOL) 500 mg, Oral, Every 6 hours PRN   aspirin EC 81 mg, Oral, Daily, Swallow whole.    baclofen (LIORESAL) 10 mg, Oral, 2 times daily PRN   Budeson-Glycopyrrol-Formoterol (BREZTRI AEROSPHERE) 160-9-4.8 MCG/ACT AERO 2 puffs, Inhalation, 2 times daily   carbidopa-levodopa (SINEMET IR) 25-100 MG tablet Take 1 tablet by mouth 4 times daily at 8am/noon/4pm/8pm   Cholecalciferol (VITAMIN D PO) 1 tablet, Oral, 2 times weekly, Vitamin D   escitalopram (LEXAPRO) 20 mg, Oral, Daily    ezetimibe (ZETIA) 10 mg, Oral, Daily   furosemide (LASIX) 20 mg, Oral, Daily, Alternating 40 mg every other day   losartan (COZAAR) 100 mg, Oral, Daily   nitroGLYCERIN (NITROSTAT) 0.4 mg, Sublingual, Every 5 min x3 PRN   potassium chloride (KLOR-CON) 10 MEQ tablet 10 mEq, Oral, Daily   traMADol (ULTRAM) 50 mg, Oral, Every 6 hours PRN       Objective:   Physical Exam BP 132/80   Pulse 93   Temp 98 F (36.7 C) (Oral)   Resp 16   Ht '5\' 5"'$  (1.651 m)   Wt 104 lb (47.2 kg)   SpO2 96%   BMI 17.31 kg/m  General:   Well developed, NAD, BMI noted. HEENT:  Normocephalic . Face symmetric, atraumatic   Lower extremities: no pretibial edema bilaterally  Skin: Not pale. Not jaundice Neurologic:  alert & oriented X3. Speech is quite limited by stuttering.  Involuntary movements are worse than before. Psych--  Cognition and judgment appear intact.  Cooperative with normal attention span and concentration.  Behavior appropriate. She is seems frustrated and anxious.     Assessment     Assessment Prediabetes  HTN : amlodipine: edema 10/2018 Hyperlipidemia, statin intolerant Anxiety, insomnia   Menopausal; DEXA 2012 -1.5 @ gyn Recurrent Syncope --3:1 AV Block pacemaker 02/21/16 --   DX with autonomic insufficiency ~ 02-2015 >>>on a  abd binder , pyridostigmine, rx midodrine 06-2015 per cards  --CT coronary morphology with  CTA and coronary score 01/12/2018: No significant disease - Diastolic CHF Traumatic SAH (after a syncope),TBI--- admitted 01-2015  -- was rx seroquel (aparently for ICU delirium) -- d/c 07-2015 --rx topamax when at rehab for post [facility]  HAs -- had issues w/ b/b incontinence, better as of 07-2015 Tremors LUE, onset 9 -2017 Osteopneia: on Boniva, rx elsewhere DOE/CP: [facility] risk Myoview, echo done 10/20/2019, saw cards 05/2020 and pulmonary 07/2020: multifactorial, PFTx ordered   PLAN Anxiety, depression, insomnia.  Parkinson, cervical dystonia. Please see last visit, she is  the caregiver for her husband Mandy Durham) who has a number of medical issues. Unfortunately due to her movement disorder she has extremely hard time performing her ADLs and taking care of her husband. Denies any husband being abusive or violent. Previously she told me one of her daughters Mandy Durham, will help her but since the last visit apparently she will not do so anymore because issues between her and Mandy Durham. She reports has no other family members or friends or neighbors that could provide help. At the last visit, I refer her to palliative care, they call it 3 times and were unable to reach her or leave a message.  States that her phone is not working well.  I asked permission to see if the team could simply go to her house and she said yes.  We will arrange another referral. For anxiety, I increase Lexapro to 20 mg but states that give her GI side effects so is taking 10 mg only Her movement disorder is much worse, I will send a message to neurology to see if her medication can be adjusted. Addendum: I communicated with neurology, they are planning to start amantadine.  From my side I will start very Durham-dose of Ativa as I believe that the problem is a vicious circle (anxiety-Parkinson-decrease ability to do ADLs).  Precautions discussed.

## 2021-12-03 MED ORDER — CLONAZEPAM 0.25 MG PO TBDP: 0.2500 mg | ORAL_TABLET | Freq: Two times a day (BID) | ORAL | 0 refills | Status: DC | PRN

## 2021-12-03 NOTE — Assessment & Plan Note (Signed)
Anxiety, depression, insomnia.  Parkinson, cervical dystonia. Please see last visit, she is the caregiver for her husband [name]) who has a number of medical issues. Unfortunately due to her movement disorder she has extremely hard time performing her ADLs and taking care of her husband and herself. Denies husband being abusive or violent. Previously she told me one of her daughters, [name], was helping her but since the last visit apparently she will not do so anymore because issues between her and [name]. She reports has no other family members, friends or neighbors that could provide help. At the last visit, I refer her to palliative care, they called 3 times and were unable to reach her or leave a message.  States that her phone is not working well.  I asked permission to see if the team could simply go to her house and she said yes.  We will arrange another referral. For anxiety, I increase Lexapro to 20 mg but states that give her GI side effects so is taking 10 mg only Her movement disorder is much worse, I will send a message to neurology to see if her medication can be adjusted. Addendum: After the patient left, I communicated with neurology, they are planning to start amantadine.  From my side I will start very low-dose of clonazepam as I believe that the problem is a vicious circle (anxiety-Parkinson-decrease ability to do ADLs).  We will call her and let her know, watch for excessive sedation

## 2021-12-05 ENCOUNTER — Other Ambulatory Visit: Payer: PPO

## 2021-12-05 ENCOUNTER — Telehealth: Payer: Self-pay | Admitting: Internal Medicine

## 2021-12-05 ENCOUNTER — Telehealth: Payer: Self-pay

## 2021-12-05 DIAGNOSIS — Z515 Encounter for palliative care: Secondary | ICD-10-CM

## 2021-12-05 NOTE — Telephone Encounter (Signed)
Almyra Free from [facility] calling to advise that they have tried calling patient but cannot reach her. They also went to her house today and she did not answer the door. Almyra Free would like to know if there is something specific that Dr. Larose Kells wants them to help with or something specific the patient needs as they have not been able to connect with her. Please call to advise [phone]

## 2021-12-05 NOTE — Progress Notes (Signed)
PATIENT NAME: Mita MELYSSA SIGNOR DOB: 04/17/1945 MRN: [id number]  PRIMARY CARE PROVIDER: Colon Branch, MD  RESPONSIBLE PARTY:  Acct ID - Guarantor Home Phone Work Phone Relationship Acct Type  [id number] Caesar [phone]  Self P/F     [address], [place], [place] [address]    Referral received from Dr. Larose Kells with message stating patient is having issues with her phone and patient has agreed for Palliative Care to just come to the home.  Joint visit made this am with Palliative Care SW, Grant.  Rang front door and side door bell with no response.  Attempted to call patient on cell phone.  No answer.  Unable to leave a message on VM as it is full.  Palliative Care folder with contact information left in the door for patient.  1045 am.  Phone call made to Dr. Ethel Rana office. Spoke to [place] and advised of above and to see what specific needs patient has.       Lorenza Burton, RN

## 2021-12-05 NOTE — Progress Notes (Signed)
SW and RN-J. Owens Shark attempted a visit with patient at her home and received no answer at the door. The team attempted to call patient while present and did not get an answer. A palliative care folder was left in the door and RN will follow-up with referring physician for follow-up.

## 2021-12-05 NOTE — Telephone Encounter (Signed)
SW text [phone]) and emailed patient ([email or web address]) requesting to contact the social worker or Therapist, sports. SW included their work cells numbers to call back.

## 2021-12-05 NOTE — Telephone Encounter (Signed)
The patient has Parkinson, dystonia, that is difficulty in her speech and mobility.  She takes care of her husband who has multiple medical problems. She is not getting any family help. Has a difficult time completing her ADLs. She really needs to be evaluated, see if there is any we can do as a system to help her.

## 2021-12-08 ENCOUNTER — Other Ambulatory Visit: Payer: PPO

## 2021-12-08 VITALS — BP 130/72 | HR 85 | Temp 97.5°F

## 2021-12-08 DIAGNOSIS — Z515 Encounter for palliative care: Secondary | ICD-10-CM

## 2021-12-08 NOTE — Progress Notes (Signed)
PATIENT NAME: Mandy Durham DOB: Jul 27, 1945 MRN: [id number]  PRIMARY CARE PROVIDER: Colon Branch, MD  RESPONSIBLE PARTY:  Acct ID - Guarantor Home Phone Work Phone Relationship Acct Type  [id number] Caesar Chestnut* [phone]  Self P/F     [address], [place], [place] [address]   Connected Monica Lonon, SW virtually with patient.   ACP:  Ongoing discussion.  Patient believes she would like a DNR status but would like more time to think/discuss with spouse.  Appetite:  Patient endorses weight loss.  Po intake is limited to 2 meals a day.  Patient does not cook.  Spouse endorses eating out daily.  We discussed a supplement drink given weight loss.  Patient is open to trying.  Discussed [facility] with patient and she is open to being added on the wait list.  SW to contact [facility].   Safety:  Both patient and spouse endorse safety concerns in the bathroom.  Neither shower and are only doing sponge baths.  Spouse desires renovation but states patient has been refusing.  Patient states she previously disagreed with this plan but sees it would be beneficial for both of them.  Spouse states he has fallen in the bathroom in the past.  SW was able to provide resources for a bathroom renovation to spouse.  Patient denies any recent falls.  Currently using a rollator when outside the home.  Spouse endorses he is still driving and drives patient to her appointments and grocery store.   We discussed [facility] [employer] as an option to provide more in home support for patient and light house keeping.  Spouse endorses contacting this agency last week.  He will follow up this week with pricing.      Follow up:  Attempted to schedule a follow up visit next month with Dr. Mariea Clonts.  Spouse declines stating he has enough to address right now.  Patient is agreeable to a phone call.  Home phone number provided by spouse. [phone] Home Phone).    CODE STATUS: Full ADVANCED DIRECTIVES: No MOST FORM: No PPS: 50%   PHYSICAL EXAM:    VITALS: Today's Vitals   12/08/21 1242  BP: 130/72  Pulse: 85  Temp: (!) 97.5 F (36.4 C)  SpO2: 96%       Lorenza Burton, RN

## 2021-12-09 NOTE — Progress Notes (Signed)
COMMUNITY PALLIATIVE CARE SW NOTE  PATIENT NAME: Mandy Durham DOB: Jul 25, 1945 MRN: [id number]  PRIMARY CARE PROVIDER: Colon Branch, MD  RESPONSIBLE PARTY:  Acct ID - Guarantor Home Phone Work Phone Relationship Acct Type  [id number] Caesar Chestnut* [phone]  Self P/F     [address], [place], [place] [address]   SOCIAL WORK TELEPHONIC VISIT  PC SW jointed RN-J. Owens Shark virtually for a telephonic visit with patient and her husband. SW provided support and assessed their needs. Patient's husband advised he needed assistance with housekeeping 1x week. He is also spending a lot of money on eating out monthly. They also wanted to do renovations to their bathroom. SW provided resources/companies to contact to discuss renovations. SW will make a referral to mobile meals for patient and her spouse. SW provided resources for housekeeping services. SW reinforced contact information and encouraged them to call with any additional questions or concerns.    [address] [place], [place]

## 2021-12-12 ENCOUNTER — Telehealth: Payer: Self-pay | Admitting: Internal Medicine

## 2021-12-12 NOTE — Telephone Encounter (Signed)
Patient is calling regarding one of her medications. It costs $150 and she can't afford it. Patient is currently in the pharmacy parking lot and would like a call back as soon as possible. Please call [phone].

## 2021-12-12 NOTE — Telephone Encounter (Signed)
This was in regards to Mrs. Ruttan's husband Nou Chard and his Delene Loll - please see phone documentation in his chart.

## 2021-12-18 ENCOUNTER — Encounter: Payer: Self-pay | Admitting: Neurology

## 2021-12-18 ENCOUNTER — Other Ambulatory Visit: Payer: Self-pay | Admitting: Neurology

## 2021-12-18 ENCOUNTER — Ambulatory Visit: Payer: PPO | Admitting: Neurology

## 2021-12-18 VITALS — BP 98/64 | HR 107 | Ht 65.0 in | Wt 107.5 lb

## 2021-12-18 DIAGNOSIS — G20C Parkinsonism, unspecified: Secondary | ICD-10-CM

## 2021-12-18 DIAGNOSIS — I441 Atrioventricular block, second degree: Secondary | ICD-10-CM | POA: Diagnosis not present

## 2021-12-18 DIAGNOSIS — M40209 Unspecified kyphosis, site unspecified: Secondary | ICD-10-CM

## 2021-12-18 DIAGNOSIS — G2401 Drug induced subacute dyskinesia: Secondary | ICD-10-CM | POA: Diagnosis not present

## 2021-12-18 DIAGNOSIS — H532 Diplopia: Secondary | ICD-10-CM | POA: Diagnosis not present

## 2021-12-18 DIAGNOSIS — S06342S Traumatic hemorrhage of right cerebrum with loss of consciousness of 31 minutes to 59 minutes, sequela: Secondary | ICD-10-CM

## 2021-12-18 DIAGNOSIS — G243 Spasmodic torticollis: Secondary | ICD-10-CM

## 2021-12-18 DIAGNOSIS — R531 Weakness: Secondary | ICD-10-CM

## 2021-12-18 DIAGNOSIS — G249 Dystonia, unspecified: Secondary | ICD-10-CM | POA: Insufficient documentation

## 2021-12-18 DIAGNOSIS — R4782 Fluency disorder in conditions classified elsewhere: Secondary | ICD-10-CM

## 2021-12-18 MED ORDER — AMANTADINE HCL 100 MG PO CAPS: 100.0000 mg | ORAL_CAPSULE | Freq: Two times a day (BID) | ORAL | 3 refills | Status: DC

## 2021-12-18 NOTE — Patient Instructions (Addendum)
There is dystonia of the neck and shoulder, the left arm noted.  I ordered amantadine 100 mg bid for the patient, she had not collected the last prescription form 12-02-2021; I like for her to see Dr Krista Blue for  torticollis.   For dystonia / with hyperkinesis speaks the significant weight loss.   The patient reports that she did not see much of a relaxation effect on baclofen, I suggested that from the twice daily twice a day dose we are going for 8 days to at night only and if she does not see a significant worsening of symptoms in daytime we may then forego the nighttime dose of baclofen as well.  She does feel that carbidopa levodopa may have an effect on amplitude of tremor and on the degree of in which her muscles and are contracting, the 4 times a day dosing however seems not to have had benefit over 3 times a day.  I added see amantadine again as described above.  I do want her to see Dr. Krista Blue once more because her clinical picture has begun to crystallize more into what is torticollis and dystonia bother and this setting often atypical Parkinson syndrome or Parkinson plus.  This would include the transient diplopia and difficulty reading, the spasmodic dysfunction of the vocal cords, while I am happy that she has not had dysphagia.    Speech therapy and occupational therapy are needed.

## 2021-12-18 NOTE — Progress Notes (Signed)
PATIENT: Mandy Durham DOB: 01/19/1946  REASON FOR VISIT: follow up HISTORY FROM: patient PRIMARY NEUROLOGIST: Dr. Brett Fairy  Chief Complaint  Patient presents with   Follow-up    Pt in room #10 and alone. Pt here today for f/u parkinsonism.     HISTORY OF PRESENT ILLNESS: Today 12/18/21:Mandy Durham is a 76 year old female with a history of tremor and dystonia- she has changed sicne my last visit with hr to 25/ 100 mg four times a day and she lost a lot of weight . She is moving all the time, with hesitant speech today and her neck is tilted to her left, chin down, pointing to the chest, and she looks up from this  head down position" - speech is clearly affected , stammering speech, no fluent-  and dysphonia , but she denies dysphagia. I think we are looking at dystonia.  Left neck paraspinal  muscles and trapezius is contracted  as is the platysm. Amantadine as ordered after a phone conversation 12-02-2021 but not collected at pharmacy. Her left arm is stiff,  flexed and the right arm and hand are much smoother in movement. She related onset on symptoms after a fall 8 years ago, her tremor began and increased since- as she recalls today.  She reports not feeling much joy in life and is der pressed by her appearance, her limitations, Her memory is of concern to her.    08-2021: MMToday 10/12/21:  Mandy Durham is a 76 year old female with a history of parkinson's disease. She returns today for follow-up. Saw Dr. Carles Collet in May and she made the recommendation to change to sinemet 25-100 QID and to stop requip. Reports that Dyskinesias has gotten worse. Trouble speaking, moving shoulders and lips. She was given baclofen 5 mg BID and found it beneficial but feels that the dosage needs to be higher. Using it for neck pain.  She reports right now she feels that she has no quality of life.  When she saw Dr. Carles Collet there was some concern about possible MSA due to the patient's symptoms of diplopia and  low-dose dyskinesia.  At that time Sinemet was increased to 25-100 mg 4 times a day.  symptoms of diplopia and low-dose dyskinesia have persistent.   Saw neurosurgeon Dr Saintclair Halsted who evaluated for neck pain.   REVIEW OF SYSTEMS: Out of a complete 14 system review of symptoms, the patient complains only of the following symptoms, and all other reviewed systems are negative.  Diplopia   Dystonia  Weight loss, unintentional.  Memory concerns,   Depression. Frustration.   neuropathy  ALLERGIES: No Known Allergies  HOME MEDICATIONS: Outpatient Medications Prior to Visit  Medication Sig Dispense Refill   acetaminophen (TYLENOL) 500 MG tablet Take 500 mg by mouth every 6 (six) hours as needed.     amantadine (SYMMETREL) 100 MG capsule Take 1 capsule (100 mg total) by mouth 2 (two) times daily. 60 capsule 3   aspirin EC 81 MG tablet Take 81 mg by mouth daily. Swallow whole.     baclofen (LIORESAL) 10 MG tablet Take 1 tablet (10 mg total) by mouth 2 (two) times daily as needed for muscle spasms. 60 each 5   Budeson-Glycopyrrol-Formoterol (BREZTRI AEROSPHERE) 160-9-4.8 MCG/ACT AERO Inhale 2 puffs into the lungs in the morning and at bedtime. 10.7 g 11   carbidopa-levodopa (SINEMET IR) 25-100 MG tablet Take 1 tablet by mouth 4 times daily at 8am/noon/4pm/8pm 360 tablet 1   Cholecalciferol (  VITAMIN D PO) Take 1 tablet by mouth 2 (two) times a week. Vitamin D     clonazePAM (KLONOPIN) 0.25 MG disintegrating tablet Take 1 tablet (0.25 mg total) by mouth 2 (two) times daily as needed (Anxiety). 30 tablet 0   escitalopram (LEXAPRO) 10 MG tablet Take 1 tablet (10 mg total) by mouth daily.     ezetimibe (ZETIA) 10 MG tablet Take 1 tablet (10 mg total) by mouth daily. 90 tablet 1   furosemide (LASIX) 20 MG tablet Take 1 tablet (20 mg total) by mouth daily. Alternating 40 mg every other day 90 tablet 3   losartan (COZAAR) 100 MG tablet Take 1 tablet (100 mg total) by mouth daily. 90 tablet 1    nitroGLYCERIN (NITROSTAT) 0.4 MG SL tablet Place 1 tablet (0.4 mg total) under the tongue every 5 (five) minutes x 3 doses as needed for chest pain. 25 tablet 1   potassium chloride (KLOR-CON) 10 MEQ tablet Take 1 tablet (10 mEq total) by mouth daily. 90 tablet 3   traMADol (ULTRAM) 50 MG tablet Take 50 mg by mouth every 6 (six) hours as needed.     No facility-administered medications prior to visit.    PAST MEDICAL HISTORY: Past Medical History:  Diagnosis Date   Anxiety state, unspecified    Hyperlipidemia    Hypertension    Insomnia    LBBB (left bundle branch block)    LHC in 2002 showed normal coronaries.    Osteoarthrosis, unspecified whether generalized or localized, unspecified site    Overweight(278.02)    Parkinson disease    S/P placement of cardiac pacemaker    a. 02/21/16: Medtronic Advisa DR MRI SureScan model G3TD17 (serial number [id number])    Serrated adenoma of colon 2007   Symptomatic menopausal or female climacteric states    Syncope and collapse    11/12: Holter 12/12 with rare PACS, HR range 64-140, average 82, no significant arrhythmias. Echo (1/13): EF 50-55%, mild LVH, septal-lateral dyssynchrony c/w LBBB. 3-week event monitor (1/13): No significant arrhythmia.     PAST SURGICAL HISTORY: Past Surgical History:  Procedure Laterality Date   COLONOSCOPY  2007   EP IMPLANTABLE DEVICE N/A 02/21/2016   Procedure: Pacemaker Implant;  Surgeon: Will Meredith Leeds, MD;  Location: [facility];  Service: Cardiovascular;  Laterality: N/A;   POLYPECTOMY      FAMILY HISTORY: Family History  Problem Relation Age of Onset   Heart failure Mother    Coronary artery disease Mother    Dementia Mother    Diabetes Mother    Colon cancer Neg Hx    Breast cancer Neg Hx    Hypertension Neg Hx    Heart disease Neg Hx    Heart attack Neg Hx    Stroke Neg Hx    Rectal cancer Neg Hx    Stomach cancer Neg Hx    Esophageal cancer Neg Hx    Liver disease Neg Hx     Parkinson's disease Neg Hx     SOCIAL HISTORY: Social History   Socioeconomic History   Marital status: Married    Spouse name: Eddie   Number of children: 2   Years of education: 12   Highest education level: 12th grade  Occupational History   Occupation: retired Environmental consultant: [employer]  Tobacco Use   Smoking status: Never    Passive exposure: Never   Smokeless tobacco: Never  Vaping Use   Vaping  Use: Never used  Substance and Sexual Activity   Alcohol use: No   Drug use: No   Sexual activity: Not Currently  Other Topics Concern   Not on file  Social History Narrative   HSG.  Married '65.     2 daughters,   '71 [name], lives in [place].   '77; Jenny Reichmann, lives in [place]   4 grandchildren   Lives with husband   Right handed   Caffeine: half -1 cup a coffee a day         Social Determinants of Health   Financial Resource Strain: Low Risk  (04/30/2021)   Overall Financial Resource Strain (CARDIA)    Difficulty of Paying Living Expenses: Not hard at all  Food Insecurity: No Food Insecurity (04/30/2021)   Hunger Vital Sign    Worried About Running Out of Food in the Last Year: Never true    Ran Out of Food in the Last Year: Never true  Transportation Needs: No Transportation Needs (04/30/2021)   PRAPARE - Hydrologist (Medical): No    Lack of Transportation (Non-Medical): No  Physical Activity: Inactive (04/30/2021)   Exercise Vital Sign    Days of Exercise per Week: 0 days    Minutes of Exercise per Session: 0 min  Stress: Stress Concern Present (04/30/2021)   [place]    Feeling of Stress : Rather much  Social Connections: Moderately Integrated (04/30/2021)   Social Connection and Isolation Panel [NHANES]    Frequency of Communication with Friends and Family: More than three times a week    Frequency of  Social Gatherings with Friends and Family: Three times a week    Attends Religious Services: 1 to 4 times per year    Active Member of Clubs or Organizations: No    Attends Archivist Meetings: Never    Marital Status: Married  Human resources officer Violence: Not At Risk (04/30/2021)   Humiliation, Afraid, Rape, and Kick questionnaire    Fear of Current or Ex-Partner: No    Emotionally Abused: No    Physically Abused: No    Sexually Abused: No      PHYSICAL EXAM  Vitals:   12/18/21 1335  BP: 98/64  Pulse: (!) 107  Weight: 107 lb 8 oz (48.8 kg)  Height: '5\' 5"'$  (1.651 m)   Body mass index is 17.89 kg/m.  Generalized: Well developed, in no acute distress   Neurological examination  Mentation: Alert oriented to time, place, history taking.  Follows all commands speech is haltered with stammering, dysphonia, but she denies dysphonia.  Cranial nerve: she reports no loss of taste or smell.   Pupils were equal round reactive to light. Extraocular movements were full, but she reports diplopia, skewed, with vertical gaze, mostly down. The visual field wasfull on confrontational test.  Facial sensation and strength were normal.  Uvula  and tongue midline.  Her tongue is trembling - Head turning and shoulder shrug  are significanly reduced, There is high tone contraction of the muscles keeping her head in a left tilt and flexion. Her chin points downwards.  dyskinesia. Sensory: reduced to vibration in both feet.   Coordination: Cerebellar testing had to be deferred.  Gait and station: Uses the chair to push to stand up and strides with decreased left arm swing. Stooped.    DIAGNOSTIC DATA (LABS, IMAGING, TESTING) - I reviewed patient records, labs, notes, testing and  imaging myself where available.  Lab Results  Component Value Date   WBC 7.1 06/09/2021   HGB 13.2 06/09/2021   HCT 40.5 06/09/2021   MCV 88 06/09/2021   PLT 293 06/09/2021      Component Value Date/Time    NA 138 08/26/2021 0000   K 3.7 08/26/2021 0000   CL 102 08/26/2021 0000   CO2 22 08/26/2021 0000   GLUCOSE 109 (H) 08/26/2021 0000   GLUCOSE 119 (H) 01/22/2021 1625   BUN 19 08/26/2021 0000   CREATININE 0.82 08/26/2021 0000   CREATININE 0.85 06/07/2020 1030   CALCIUM 9.0 08/26/2021 0000   PROT 6.3 06/09/2021 1450   ALBUMIN 4.2 06/09/2021 1450   AST 17 06/09/2021 1450   ALT 7 06/09/2021 1450   ALKPHOS 101 06/09/2021 1450   BILITOT 0.3 06/09/2021 1450   GFRNONAA 81 12/04/2019 1540   GFRAA 93 12/04/2019 1540   Lab Results  Component Value Date   CHOL 200 05/26/2021   HDL 108.70 05/26/2021   LDLCALC 76 05/26/2021   LDLDIRECT 106.9 11/03/2012   TRIG 75.0 05/26/2021   CHOLHDL 2 05/26/2021   Lab Results  Component Value Date   HGBA1C 6.1 (H) 06/09/2021   Lab Results  Component Value Date   VITAMINB12 290 06/09/2021   Lab Results  Component Value Date   TSH 1.050 06/09/2021      ASSESSMENT AND PLAN 76 y.o. year old female  has a past medical history of Anxiety state, unspecified, Hyperlipidemia, Hypertension, Insomnia, LBBB (left bundle branch block), Osteoarthrosis, unspecified whether generalized or localized, unspecified site, Overweight(278.02), Parkinson disease, S/P placement of cardiac pacemaker, Serrated adenoma of colon (2007), Symptomatic menopausal or female climacteric states, and Syncope and collapse. here with:  1.  Atypical parkinsonism , now with hyperkinesis of the body and torticolli to the left, dystonia.  2.  Cervical dystonia 3. Significant weight loss, loss of appetite but not dysphagia.  4.  added amantadine for dystonia. I have not seen any improvement of dopamin, rather an increase in undesired  movements.   5. She reports than baclofen to 10 mg twice a day did not help, will go to one at night for 8 days and then stop Baclofen    6. I referred for botox to Dr Krista Blue. Her clinical picture has changed.   Discussed potential side effects with the  patient.  She verbalized understanding. Speech therapy and OT were added.      Dr. Larey Seat, MD    12/18/2021, 1:53 PM [facility] [address] [place], [place] [address] [phone]  There is dystonia of the neck and shoulder, the left arm noted.  I ordered amantadine 100 mg bid for the patient, she had not collected the last prescription form 12-02-2021; I like for her to see Dr Krista Blue for  torticollis.   For dystonia / with hyperkinesis speaks the significant weight loss.   The patient reports that she did not see much of a relaxation effect on baclofen, I suggested that from the twice daily twice a day dose we are going for 8 days to at night only and if she does not see a significant worsening of symptoms in daytime we may then forego the nighttime dose of baclofen as well.  She does feel that carbidopa levodopa may have an effect on amplitude of tremor and on the degree of in which her muscles and are contracting, the 4 times a day dosing however seems not to have  had benefit over 3 times a day.  I added see amantadine again as described above.  I do want her to see Dr. Krista Blue once more because her clinical picture has begun to crystallize more into what is torticollis and dystonia bother and this setting often atypical Parkinson syndrome or Parkinson plus.  This would include the transient diplopia and difficulty reading, the spasmodic dysfunction of the vocal cords, while I am happy that she has not had dysphagia.    Speech therapy and occupational therapy are needed.

## 2021-12-22 ENCOUNTER — Telehealth: Payer: Self-pay

## 2021-12-22 NOTE — Telephone Encounter (Signed)
I attempted to reach this pt and schedule visit as requested by Dr. Krista Blue. VM full and not accepting new messages. Will send to schedulers for further follow up.

## 2021-12-22 NOTE — Telephone Encounter (Signed)
-----   Message from Marcial Pacas, MD sent at 12/18/2021  3:37 PM EDT ----- Please put her on my schedule.  ----- Message ----- From: Larey Seat, MD Sent: 12/18/2021   2:44 PM EDT To: Marcial Pacas, MD  Atypical parkinsonism- hypotension,  not helped by sinemet, dystonia, torticolli , can you re- evaluate ? Something for the neck spasms.

## 2021-12-23 NOTE — Telephone Encounter (Signed)
Unable to reach pt or LVM x2.

## 2022-01-07 NOTE — Telephone Encounter (Signed)
error 

## 2022-01-22 ENCOUNTER — Telehealth: Payer: Self-pay | Admitting: Physical Therapy

## 2022-01-22 DIAGNOSIS — G20C Parkinsonism, unspecified: Secondary | ICD-10-CM

## 2022-01-22 DIAGNOSIS — R2689 Other abnormalities of gait and mobility: Secondary | ICD-10-CM

## 2022-01-22 DIAGNOSIS — G2401 Drug induced subacute dyskinesia: Secondary | ICD-10-CM

## 2022-01-22 NOTE — Telephone Encounter (Signed)
Dr. Brett Fairy,  Mandy Durham is scheduled for PT re-evaluations on 01/27/22 as recommended when pt was last discharged from therapy due to progressive nature of diagnosis.  Pt was in agreement with this plan.  If you are in agreement, please send updated order for PT via [facility] for Parkinsonism.   Thank you, [name], PT, DPT 01/22/22 11:31 AM   [facility] [address] [place], [place]  [address] Phone:  [phone] Fax:  [phone]

## 2022-01-27 ENCOUNTER — Ambulatory Visit: Payer: PPO | Admitting: Physical Therapy

## 2022-01-28 ENCOUNTER — Ambulatory Visit (INDEPENDENT_AMBULATORY_CARE_PROVIDER_SITE_OTHER): Payer: PPO | Admitting: Internal Medicine

## 2022-01-28 ENCOUNTER — Encounter: Payer: Self-pay | Admitting: Internal Medicine

## 2022-01-28 VITALS — BP 134/60 | HR 101 | Temp 98.3°F | Resp 16 | Ht 65.0 in | Wt 104.4 lb

## 2022-01-28 DIAGNOSIS — R739 Hyperglycemia, unspecified: Secondary | ICD-10-CM

## 2022-01-28 DIAGNOSIS — Z23 Encounter for immunization: Secondary | ICD-10-CM

## 2022-01-28 DIAGNOSIS — R627 Adult failure to thrive: Secondary | ICD-10-CM

## 2022-01-28 DIAGNOSIS — I10 Essential (primary) hypertension: Secondary | ICD-10-CM

## 2022-01-28 NOTE — Patient Instructions (Addendum)
You can call palliative care at: [facility] can reach speech therapy at: [phone] They are located in the same building as neurology     GO TO THE LAB : Get the blood work     [place], [place] back for   a checkup in 4 months    Vaccines I recommend:  Shingrix (shingles) Covid booster Tdap (tetanus) RSV vaccine
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
## 2022-01-29 LAB — CBC WITH DIFFERENTIAL/PLATELET
Basophils Absolute: 0.1 10*3/uL (ref 0.0–0.1)
Basophils Relative: 1.4 % (ref 0.0–3.0)
Eosinophils Absolute: 0.1 10*3/uL (ref 0.0–0.7)
Eosinophils Relative: 0.7 % (ref 0.0–5.0)
HCT: 41.7 % (ref 36.0–46.0)
Hemoglobin: 13.6 g/dL (ref 12.0–15.0)
Lymphocytes Relative: 16.7 % (ref 12.0–46.0)
Lymphs Abs: 1.5 10*3/uL (ref 0.7–4.0)
MCHC: 32.7 g/dL (ref 30.0–36.0)
MCV: 85.9 fl (ref 78.0–100.0)
Monocytes Absolute: 0.7 10*3/uL (ref 0.1–1.0)
Monocytes Relative: 8 % (ref 3.0–12.0)
Neutro Abs: 6.5 10*3/uL (ref 1.4–7.7)
Neutrophils Relative %: 73.2 % (ref 43.0–77.0)
Platelets: 283 10*3/uL (ref 150.0–400.0)
RBC: 4.85 Mil/uL (ref 3.87–5.11)
RDW: 14.7 % (ref 11.5–15.5)
WBC: 8.9 10*3/uL (ref 4.0–10.5)

## 2022-01-29 LAB — BASIC METABOLIC PANEL
BUN: 23 mg/dL (ref 6–23)
CO2: 28 mEq/L (ref 19–32)
Calcium: 9 mg/dL (ref 8.4–10.5)
Chloride: 102 mEq/L (ref 96–112)
Creatinine, Ser: 0.87 mg/dL (ref 0.40–1.20)
GFR: 64.3 mL/min (ref >60.00)
Glucose, Bld: 87 mg/dL (ref 70–99)
Potassium: 3.9 mEq/L (ref 3.5–5.1)
Sodium: 139 mEq/L (ref 135–145)

## 2022-01-29 LAB — HEMOGLOBIN A1C: Hgb A1c MFr Bld: 6.6 % — ABNORMAL HIGH (ref 4.6–6.5)

## 2022-01-29 NOTE — Assessment & Plan Note (Signed)
Failure to thrive: Since the last visit at this office 12/02/2021. 1.  Palliative care reach out to her several times.  They communicate 12/08/2021.  See note. They declined a follow-up w/ Dr Mariea Clonts  but agreed on further phone calls. Today we got noticed that she was discharged from palliative care but  states she is still interested, requested phone number for palliative which was provided.   2. Saw neurology 12/18/2021: They noted weight loss, added amantadine for dystonia, no improvement noted with dopamine.  Reportedly baclofen 10 mg twice daily needed no help.  Was rec to wean off baclofen, see Dr. Krista Blue, and see speech therapy >>> none of that has  happened.  number for ST provided per patient request. 3. Her 2 daughters live in the area but they are not helping their parents (per patient). I offered Maya to reach out to them and explained them the situation.  She wonders if that would be useful.  I am electing to contact them (on behalf of [place] husband who is also my patient.  See his chart.) 4. Patient is still driving, strongly encouraged to find an alternative transportation. Prediabetes: Check A1c HTN: On potassium, Lasix, losartan.  Check BMP and CBC Flu shot today RTC 3 to 4 months

## 2022-01-30 ENCOUNTER — Telehealth: Payer: Self-pay

## 2022-01-30 DIAGNOSIS — G214 Vascular parkinsonism: Secondary | ICD-10-CM

## 2022-01-30 DIAGNOSIS — R627 Adult failure to thrive: Secondary | ICD-10-CM

## 2022-01-30 DIAGNOSIS — I5032 Chronic diastolic (congestive) heart failure: Secondary | ICD-10-CM
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
## 2022-01-30 NOTE — Telephone Encounter (Signed)
New order for PT for parkinsonism- PD . Sent to Neuro rehab at [place],  [place].

## 2022-01-31 NOTE — Telephone Encounter (Signed)
Thank you :)

## 2022-02-02 ENCOUNTER — Ambulatory Visit: Payer: PPO | Admitting: Podiatry

## 2022-02-02 ENCOUNTER — Encounter: Payer: Self-pay | Admitting: Internal Medicine

## 2022-02-04 ENCOUNTER — Telehealth: Payer: Self-pay | Admitting: *Deleted

## 2022-02-04 NOTE — Progress Notes (Signed)
  Care Coordination  Outreach Note  02/04/2022 Name: Shirin JOSSELYN HARKINS MRN: [id number] DOB: 12-25-1945   Care Coordination Outreach Attempts: An unsuccessful telephone outreach was attempted today to offer the patient information about available care coordination services as a benefit of their health plan.   Received referral   Follow Up Plan:  Additional outreach attempts will be made to offer the patient care coordination information and services.   Encounter Outcome:  No Answer  Julian Hy, [place] Direct Dial: [phone]

## 2022-02-10 NOTE — Progress Notes (Signed)
  Care Coordination   Note   02/10/2022 Name: Mandy Durham MRN: [id number] DOB: Sep 09, 1945  [name] is a 76 y.o. year old female who sees Larose Kells, Alda Berthold, MD for primary care. I reached out to [name] by phone today to offer care coordination services.  [name] was given information about Care Coordination services today including:   The Care Coordination services include support from the care team which includes your Nurse Coordinator, Clinical Social Worker, or Pharmacist.  The Care Coordination team is here to help remove barriers to the health concerns and goals most important to you. Care Coordination services are voluntary, and the patient may decline or stop services at any time by request to their care team member.   Care Coordination Consent Status: Patient agreed to services and verbal consent obtained.   Follow up plan:  Telephone appointment with care coordination team member scheduled for:  02/13/2022  Encounter Outcome:  Pt. Scheduled from referral   Julian Hy, [place] Direct Dial: [phone]

## 2022-02-13 ENCOUNTER — Ambulatory Visit: Payer: Self-pay | Admitting: Licensed Clinical Social Worker

## 2022-02-13 NOTE — Patient Instructions (Signed)
Visit Information  Thank you for taking time to visit with me today. Please don't hesitate to contact me if I can be of assistance to you.   Following are the goals we discussed today:   Goals Addressed               This Visit's Progress     Care Coordination Activities- Meals on Wheels (pt-stated)        Patient in need of prepared meals. PCP concerned with declining health. Patient concerned with food insecurity. SW discussed food pantries, SNAP and Meals on wheels. SW placed referrals for Senior meal delivery.          Patient verbalizes understanding of instructions and care plan provided today and agrees to view in [place]. Active MyChart status and patient understanding of how to access instructions and care plan via MyChart confirmed with patient.     No further follow up required: .  Milus Height, Arita Miss, MSW, [place]  Social Worker [facility]  [phone]

## 2022-02-13 NOTE — Patient Outreach (Signed)
  Care Coordination   Initial Visit Note   02/13/2022 Name: Mandy Durham MRN: [id number] DOB: September 16, 1945  Mandy Durham is a 76 y.o. year old female who sees Larose Kells, Alda Berthold, MD for primary care. I spoke with  Mandy Durham by phone today.  What matters to the patients health and wellness today?  Food Insecurity     Goals Addressed               This Visit's Progress     Care Coordination Activities- Meals on Wheels (pt-stated)        Patient in need of prepared meals. PCP concerned with declining health. Patient concerned with food insecurity. SW discussed food pantries, SNAP and Meals on wheels. SW placed referrals for Senior meal delivery.        SDOH assessments and interventions completed:  Yes  SDOH Interventions Today    Flowsheet Row Most Recent Value  SDOH Interventions   Food Insecurity Interventions [id number] Referral        Care Coordination Interventions:  Yes, provided   Follow up plan: No further intervention required.   Encounter Outcome:  Pt. Visit Completed

## 2022-02-23 ENCOUNTER — Encounter: Payer: PPO | Admitting: Cardiology

## 2022-03-18 ENCOUNTER — Inpatient Hospital Stay (HOSPITAL_COMMUNITY)
Admission: EM | Admit: 2022-03-18 | Discharge: 2022-03-26 | DRG: 177 | Disposition: A | Discharge status: Skilled Nursing Facility | Payer: PPO | Attending: Internal Medicine | Admitting: Internal Medicine

## 2022-03-18 ENCOUNTER — Other Ambulatory Visit: Payer: Self-pay

## 2022-03-18 ENCOUNTER — Emergency Department (HOSPITAL_COMMUNITY): Payer: PPO

## 2022-03-18 ENCOUNTER — Encounter (HOSPITAL_COMMUNITY): Payer: Self-pay

## 2022-03-18 ENCOUNTER — Telehealth: Payer: Self-pay | Admitting: Internal Medicine

## 2022-03-18 DIAGNOSIS — F419 Anxiety disorder, unspecified: Secondary | ICD-10-CM | POA: Diagnosis not present

## 2022-03-18 DIAGNOSIS — R627 Adult failure to thrive: Secondary | ICD-10-CM | POA: Diagnosis not present

## 2022-03-18 DIAGNOSIS — U071 COVID-19: Principal | ICD-10-CM | POA: Diagnosis present

## 2022-03-18 DIAGNOSIS — E876 Hypokalemia: Secondary | ICD-10-CM | POA: Diagnosis present

## 2022-03-18 DIAGNOSIS — R531 Weakness: Secondary | ICD-10-CM

## 2022-03-18 DIAGNOSIS — Z950 Presence of cardiac pacemaker: Secondary | ICD-10-CM

## 2022-03-18 DIAGNOSIS — R0602 Shortness of breath: Secondary | ICD-10-CM | POA: Diagnosis not present

## 2022-03-18 DIAGNOSIS — Z79899 Other long term (current) drug therapy: Secondary | ICD-10-CM | POA: Diagnosis not present

## 2022-03-18 DIAGNOSIS — M479 Spondylosis, unspecified: Secondary | ICD-10-CM | POA: Diagnosis not present

## 2022-03-18 DIAGNOSIS — R059 Cough, unspecified: Secondary | ICD-10-CM | POA: Diagnosis not present

## 2022-03-18 DIAGNOSIS — Z7951 Long term (current) use of inhaled steroids: Secondary | ICD-10-CM

## 2022-03-18 DIAGNOSIS — F32A Depression, unspecified: Secondary | ICD-10-CM | POA: Diagnosis present

## 2022-03-18 DIAGNOSIS — E8889 Other specified metabolic disorders: Secondary | ICD-10-CM | POA: Diagnosis not present

## 2022-03-18 DIAGNOSIS — E440 Moderate protein-calorie malnutrition: Secondary | ICD-10-CM | POA: Diagnosis not present

## 2022-03-18 DIAGNOSIS — E785 Hyperlipidemia, unspecified: Secondary | ICD-10-CM | POA: Diagnosis present

## 2022-03-18 DIAGNOSIS — M6281 Muscle weakness (generalized): Secondary | ICD-10-CM | POA: Diagnosis not present

## 2022-03-18 DIAGNOSIS — R739 Hyperglycemia, unspecified: Secondary | ICD-10-CM

## 2022-03-18 DIAGNOSIS — E861 Hypovolemia: Secondary | ICD-10-CM | POA: Diagnosis not present

## 2022-03-18 DIAGNOSIS — H532 Diplopia: Secondary | ICD-10-CM | POA: Diagnosis not present

## 2022-03-18 DIAGNOSIS — R4782 Fluency disorder in conditions classified elsewhere: Secondary | ICD-10-CM | POA: Diagnosis not present

## 2022-03-18 DIAGNOSIS — Z8249 Family history of ischemic heart disease and other diseases of the circulatory system: Secondary | ICD-10-CM | POA: Diagnosis not present

## 2022-03-18 DIAGNOSIS — I441 Atrioventricular block, second degree: Secondary | ICD-10-CM | POA: Diagnosis not present

## 2022-03-18 DIAGNOSIS — I5032 Chronic diastolic (congestive) heart failure: Secondary | ICD-10-CM

## 2022-03-18 DIAGNOSIS — M40209 Unspecified kyphosis, site unspecified: Secondary | ICD-10-CM | POA: Diagnosis not present

## 2022-03-18 DIAGNOSIS — I5030 Unspecified diastolic (congestive) heart failure: Secondary | ICD-10-CM | POA: Diagnosis not present

## 2022-03-18 DIAGNOSIS — R32 Unspecified urinary incontinence: Secondary | ICD-10-CM | POA: Diagnosis not present

## 2022-03-18 DIAGNOSIS — G214 Vascular parkinsonism: Secondary | ICD-10-CM

## 2022-03-18 DIAGNOSIS — Z833 Family history of diabetes mellitus: Secondary | ICD-10-CM | POA: Diagnosis not present

## 2022-03-18 DIAGNOSIS — E871 Hypo-osmolality and hyponatremia: Secondary | ICD-10-CM | POA: Diagnosis not present

## 2022-03-18 DIAGNOSIS — I495 Sick sinus syndrome: Secondary | ICD-10-CM | POA: Diagnosis not present

## 2022-03-18 DIAGNOSIS — Z741 Need for assistance with personal care: Secondary | ICD-10-CM | POA: Diagnosis not present

## 2022-03-18 DIAGNOSIS — I10 Essential (primary) hypertension: Secondary | ICD-10-CM | POA: Diagnosis not present

## 2022-03-18 DIAGNOSIS — G5793 Unspecified mononeuropathy of bilateral lower limbs: Secondary | ICD-10-CM | POA: Diagnosis not present

## 2022-03-18 DIAGNOSIS — Z7982 Long term (current) use of aspirin: Secondary | ICD-10-CM

## 2022-03-18 DIAGNOSIS — F8081 Childhood onset fluency disorder: Secondary | ICD-10-CM | POA: Diagnosis not present

## 2022-03-18 DIAGNOSIS — G20B1 Parkinson's disease with dyskinesia, without mention of fluctuations: Secondary | ICD-10-CM | POA: Diagnosis not present

## 2022-03-18 DIAGNOSIS — I110 Hypertensive heart disease with heart failure: Secondary | ICD-10-CM | POA: Diagnosis not present

## 2022-03-18 DIAGNOSIS — G249 Dystonia, unspecified: Secondary | ICD-10-CM | POA: Diagnosis not present

## 2022-03-18 DIAGNOSIS — G9341 Metabolic encephalopathy: Secondary | ICD-10-CM | POA: Diagnosis present

## 2022-03-18 DIAGNOSIS — Z681 Body mass index (BMI) 19 or less, adult: Secondary | ICD-10-CM | POA: Diagnosis not present

## 2022-03-18 DIAGNOSIS — Z743 Need for continuous supervision: Secondary | ICD-10-CM | POA: Diagnosis not present

## 2022-03-18 DIAGNOSIS — R300 Dysuria: Secondary | ICD-10-CM | POA: Diagnosis not present

## 2022-03-18 DIAGNOSIS — G243 Spasmodic torticollis: Secondary | ICD-10-CM | POA: Diagnosis not present

## 2022-03-18 DIAGNOSIS — G20C Parkinsonism, unspecified: Secondary | ICD-10-CM | POA: Diagnosis not present

## 2022-03-18 DIAGNOSIS — R3981 Functional urinary incontinence: Secondary | ICD-10-CM | POA: Diagnosis not present

## 2022-03-18 LAB — COMPREHENSIVE METABOLIC PANEL
ALT: 23 U/L (ref 0–44)
AST: 35 U/L (ref 15–41)
Albumin: 3.4 g/dL — ABNORMAL LOW (ref 3.5–5.0)
Alkaline Phosphatase: 73 U/L (ref 38–126)
Anion gap: 10 (ref 5–15)
BUN: 16 mg/dL (ref 8–23)
CO2: 21 mmol/L — ABNORMAL LOW (ref 22–32)
Calcium: 8.6 mg/dL — ABNORMAL LOW (ref 8.9–10.3)
Chloride: 103 mmol/L (ref 98–111)
Creatinine, Ser: 0.85 mg/dL (ref 0.44–1.00)
GFR, Estimated: >60 mL/min (ref >60)
Glucose, Bld: 77 mg/dL (ref 70–99)
Potassium: 3.5 mmol/L (ref 3.5–5.1)
Sodium: 134 mmol/L — ABNORMAL LOW (ref 135–145)
Total Bilirubin: 0.7 mg/dL (ref 0.3–1.2)
Total Protein: 6.3 g/dL — ABNORMAL LOW (ref 6.5–8.1)

## 2022-03-18 LAB — URINALYSIS, ROUTINE W REFLEX MICROSCOPIC
Bacteria, UA: NONE SEEN
Bilirubin Urine: NEGATIVE
Glucose, UA: NEGATIVE mg/dL
Ketones, ur: 20 mg/dL — AB
Leukocytes,Ua: NEGATIVE
Nitrite: NEGATIVE
Protein, ur: NEGATIVE mg/dL
Specific Gravity, Urine: 1.015 (ref 1.005–1.030)
pH: 6 (ref 5.0–8.0)

## 2022-03-18 LAB — CBC WITH DIFFERENTIAL/PLATELET
Abs Immature Granulocytes: 0.02 10*3/uL (ref 0.00–0.07)
Basophils Absolute: 0 10*3/uL (ref 0.0–0.1)
Basophils Relative: 0 %
Eosinophils Absolute: 0 10*3/uL (ref 0.0–0.5)
Eosinophils Relative: 0 %
HCT: 40.2 % (ref 36.0–46.0)
Hemoglobin: 12.4 g/dL (ref 12.0–15.0)
Immature Granulocytes: 0 %
Lymphocytes Relative: 9 %
Lymphs Abs: 0.5 10*3/uL — ABNORMAL LOW (ref 0.7–4.0)
MCH: 28 pg (ref 26.0–34.0)
MCHC: 30.8 g/dL (ref 30.0–36.0)
MCV: 90.7 fL (ref 80.0–100.0)
Monocytes Absolute: 0.8 10*3/uL (ref 0.1–1.0)
Monocytes Relative: 14 %
Neutro Abs: 4.6 10*3/uL (ref 1.7–7.7)
Neutrophils Relative %: 77 %
Platelets: 189 10*3/uL (ref 150–400)
RBC: 4.43 MIL/uL (ref 3.87–5.11)
RDW: 14.1 % (ref 11.5–15.5)
WBC: 5.9 10*3/uL (ref 4.0–10.5)
nRBC: 0 % (ref 0.0–0.2)

## 2022-03-18 LAB — RESP PANEL BY RT-PCR (RSV, FLU A&B, COVID)  RVPGX2
Influenza A by PCR: NEGATIVE
Influenza B by PCR: NEGATIVE
Resp Syncytial Virus by PCR: NEGATIVE
SARS Coronavirus 2 by RT PCR: POSITIVE — AB

## 2022-03-18 MED ORDER — NIRMATRELVIR/RITONAVIR (PAXLOVID)TABLET: 3.0000 | ORAL_TABLET | Freq: Two times a day (BID) | ORAL | 0 refills | Status: DC

## 2022-03-18 MED ORDER — LACTATED RINGERS IV BOLUS
1000.0000 mL | Freq: Once | INTRAVENOUS | Status: AC
  Administered 2022-03-18: 1000 mL via INTRAVENOUS

## 2022-03-18 MED ORDER — ACETAMINOPHEN 325 MG PO TABS
650.0000 mg | ORAL_TABLET | Freq: Once | ORAL | Status: AC
  Administered 2022-03-18: 650 mg via ORAL
  Filled 2022-03-18: qty 2

## 2022-03-18 NOTE — Telephone Encounter (Signed)
Last OV 01/2022- within 90 day window, home health referral placed. I have asked that they reach out to [facility] to set up [facility] at [phone].

## 2022-03-18 NOTE — Telephone Encounter (Signed)
Patient's daughter, Mandy Durham, called to advise that she recently spoke with her dad and he has informed her that patient has used the bathroom on herself and does not want to shower to clean up and becomes agitated with him. She wanted to relay this additional information to Dr. Larose Kells CMA. She said that someone did go out to their house but because they are elderly they do not answer the door for people they do not know so if they have a badge they can show through the window or call ahead of time to advise him they are coming, he will open the door. I did explain that home health does their own staffing/scheduling so we may not be able to relay that part but I would make a note for the nurse. Please call husband [name] [phone]) to inform him of what is going on and what he can expect. Daughter, Mandy Durham, can be called if there are any issues or questions.

## 2022-03-18 NOTE — ED Provider Notes (Signed)
[facility] EMERGENCY DEPARTMENT Provider Note   CSN: [id number] Arrival date & time: 03/18/22  2127     History {Add pertinent medical, surgical, social history, OB history to HPI:1} Chief Complaint  Patient presents with   Altered Mental Status    Mandy Durham is a 77 y.o. female.  Patient is a 77 year old female with a past medical history of Parkinson's, prior subarachnoid hemorrhage, hypertension and hyperlipidemia presenting to the emergency department with generalized weakness.  Patient is here with her daughters who states that she slept until 3 PM this afternoon and has not been getting out of bed today.  Patient feels generally weak all over and has had a few episodes of urinary incontinence and reports that she was not able to make it to the bathroom due to feeling weak.  She states she is having generalized abdominal pain with mild dysuria.  She denies any nausea or vomiting, diarrhea or constipation.  She denies any chest pain but reports mild shortness of breath with cough and congestion.  She denied any fevers at home.  The history is provided by the patient and a relative.  Altered Mental Status      Home Medications Prior to Admission medications   Medication Sig Start Date End Date Taking? Authorizing Provider  acetaminophen (TYLENOL) 500 MG tablet Take 500 mg by mouth every 6 (six) hours as needed.    [provider]  amantadine (SYMMETREL) 100 MG capsule Take 1 capsule (100 mg total) by mouth 2 (two) times daily. 12/18/21   Dohmeier, Asencion Partridge, MD  aspirin EC 81 MG tablet Take 81 mg by mouth daily. Swallow whole.    [provider]  baclofen (LIORESAL) 10 MG tablet Take 1 tablet (10 mg total) by mouth 2 (two) times daily as needed for muscle spasms. 10/13/21   Ward Givens, NP  Budeson-Glycopyrrol-Formoterol (BREZTRI AEROSPHERE) 160-9-4.8 MCG/ACT AERO Inhale 2 puffs into the lungs in the morning and at bedtime. 08/12/21   [name],  [name], MD  carbidopa-levodopa (SINEMET IR) 25-100 MG tablet Take 1 tablet by mouth 4 times daily at 8am/noon/4pm/8pm 07/30/21   Tat, Eustace Quail, DO  Cholecalciferol (VITAMIN D PO) Take 1 tablet by mouth 2 (two) times a week. Vitamin D    [provider]  clonazePAM (KLONOPIN) 0.25 MG disintegrating tablet Take 1 tablet (0.25 mg total) by mouth 2 (two) times daily as needed (Anxiety). 12/03/21   Colon Branch, MD  escitalopram (LEXAPRO) 10 MG tablet Take 1 tablet (10 mg total) by mouth daily. 12/02/21   Colon Branch, MD  ezetimibe (ZETIA) 10 MG tablet Take 1 tablet (10 mg total) by mouth daily. 01/22/21   Colon Branch, MD  furosemide (LASIX) 20 MG tablet Take 1 tablet (20 mg total) by mouth daily. Alternating 40 mg every other day 03/26/21   Baldwin Jamaica, PA-C  losartan (COZAAR) 100 MG tablet Take 1 tablet (100 mg total) by mouth daily. 10/27/21   Colon Branch, MD  nitroGLYCERIN (NITROSTAT) 0.4 MG SL tablet Place 1 tablet (0.4 mg total) under the tongue every 5 (five) minutes x 3 doses as needed for chest pain. Patient not taking: Reported on 01/28/2022 08/26/21   Baldwin Jamaica, PA-C  potassium chloride (KLOR-CON) 10 MEQ tablet Take 1 tablet (10 mEq total) by mouth daily. 06/20/21   Baldwin Jamaica, PA-C  traMADol (ULTRAM) 50 MG tablet Take 50 mg by mouth every 6 (six) hours as needed. Patient not  taking: Reported on 01/28/2022 05/20/21   [provider]      Allergies    Patient has no known allergies.    Review of Systems   Review of Systems  Physical Exam Updated Vital Signs BP (!) 159/89   Pulse 87   Temp (!) 100.4 F (38 C)   Resp 18   SpO2 100%  Physical Exam Vitals and nursing note reviewed.  Constitutional:      General: She is not in acute distress.    Appearance: Normal appearance.  HENT:     Head: Normocephalic and atraumatic.     Nose: Nose normal.     Mouth/Throat:     Mouth: Mucous membranes are moist.     Pharynx: Oropharynx is clear.  Eyes:      Extraocular Movements: Extraocular movements intact.     Conjunctiva/sclera: Conjunctivae normal.     Pupils: Pupils are equal, round, and reactive to light.  Cardiovascular:     Rate and Rhythm: Normal rate and regular rhythm.     Pulses: Normal pulses.     Heart sounds: Normal heart sounds.  Pulmonary:     Breath sounds: Normal breath sounds.  Abdominal:     General: Abdomen is flat.     Palpations: Abdomen is soft.     Tenderness: There is abdominal tenderness (Diffuse). There is no right CVA tenderness, left CVA tenderness, guarding or rebound.  Musculoskeletal:        General: Normal range of motion.     Cervical back: Normal range of motion and neck supple.  Skin:    General: Skin is warm and dry.  Neurological:     General: No focal deficit present.     Mental Status: She is alert and oriented to person, place, and time.     Cranial Nerves: No cranial nerve deficit.     Sensory: No sensory deficit.     Motor: No weakness.  Psychiatric:        Mood and Affect: Mood normal.        Behavior: Behavior normal.     ED Results / Procedures / Treatments   Labs (all labs ordered are listed, but only abnormal results are displayed) Labs Reviewed  RESP PANEL BY RT-PCR (RSV, FLU A&B, COVID)  [id number]  COMPREHENSIVE METABOLIC PANEL  CBC WITH DIFFERENTIAL/PLATELET  URINALYSIS, ROUTINE W REFLEX MICROSCOPIC    EKG None  Radiology No results found.  Procedures Procedures  {Document cardiac monitor, telemetry assessment procedure when appropriate:1}  Medications Ordered in ED Medications  acetaminophen (TYLENOL) tablet 650 mg (has no administration in time range)  lactated ringers bolus 1,000 mL (has no administration in time range)    ED Course/ Medical Decision Making/ A&P                           Medical Decision Making This patient presents to the ED with chief complaint(s) of generalized weakness with pertinent past medical history of parkinson's, prior SAH, HTN,  HLD which further complicates the presenting complaint. The complaint involves an extensive differential diagnosis and also carries with it a high risk of complications and morbidity.    The differential diagnosis includes UTI, viral syndrome, dehydration, electrolyte abnormality, patient has no focal neurologic deficits on exam making ICH, mass effect or CVA unlikely, ACS, arrhythmia, anemia  Additional history obtained: Additional history obtained from family Records reviewed Primary Care Documents  ED Course and Reassessment: Upon patient's  arrival to the emergency department she is awake alert and oriented at her neurologic baseline.  Reports in triage for altered mental status, however family reports she has not been altered but rather just generally weak and fatigued.  The patient has no neurologic deficits making ICH, mass effect or CVA unlikely.  She is febrile here and will undergo infectious workup with viral swab, UA, chest x-ray as well as labs to evaluate for dehydration or electrolyte abnormality as cause of her symptoms and she will be closely reassessed.  Independent labs interpretation:  The following labs were independently interpreted: ***  Independent visualization of imaging: - I independently visualized the following imaging with scope of interpretation limited to determining acute life threatening conditions related to emergency care: ***, which revealed ***  Consultation: - Consulted or discussed management/test interpretation w/ external professional: ***  Consideration for admission or further workup: *** Social Determinants of health: ***    Amount and/or Complexity of Data Reviewed Labs: ordered. Radiology: ordered.  Risk OTC drugs.   ***  {Document critical care time when appropriate:1} {Document review of labs and clinical decision tools ie heart score, Chads2Vasc2 etc:1}  {Document your independent review of radiology images, and any outside  records:1} {Document your discussion with family members, caretakers, and with consultants:1} {Document social determinants of health affecting pt's care:1} {Document your decision making why or why not admission, treatments were needed:1} Final Clinical Impression(s) / ED Diagnoses Final diagnoses:  None    Rx / DC Orders ED Discharge Orders     None

## 2022-03-18 NOTE — Telephone Encounter (Signed)
Mandy Durham (daughter DPR OK) called stating that she had talked with Eddie and Willo and they are open to the idea of [facility] services and wanted to start the process of getting them started. Mandy Durham also stated that if someone was to be coming to the house to call them as they will not answer the door if they are not expecting anyone. This was an issue when a case worker had visited their home previously.

## 2022-03-18 NOTE — ED Notes (Signed)
Patient complains of dizziness and weakness A&Ox2 on arrival with this RN

## 2022-03-18 NOTE — ED Notes (Signed)
Hemolyzed labs redrawn and sent to lab at this time

## 2022-03-18 NOTE — ED Triage Notes (Addendum)
Per EMS patient coming from home patient is normally ambulatory and A&Ox4. Per EMS patient has been laying in bed all day and incontinent of urine with is abnormal for her. Patient with EMS is a&Ox self. Per EMS aphasia is normal for the patient. BP 160/90 HR 90 (has pacemaker)

## 2022-03-19 DIAGNOSIS — E861 Hypovolemia: Secondary | ICD-10-CM | POA: Diagnosis present

## 2022-03-19 DIAGNOSIS — E440 Moderate protein-calorie malnutrition: Secondary | ICD-10-CM | POA: Diagnosis present

## 2022-03-19 DIAGNOSIS — Z7951 Long term (current) use of inhaled steroids: Secondary | ICD-10-CM | POA: Diagnosis not present

## 2022-03-19 DIAGNOSIS — G20B1 Parkinson's disease with dyskinesia, without mention of fluctuations: Secondary | ICD-10-CM | POA: Diagnosis present

## 2022-03-19 DIAGNOSIS — Z950 Presence of cardiac pacemaker: Secondary | ICD-10-CM | POA: Diagnosis not present

## 2022-03-19 DIAGNOSIS — R531 Weakness: Secondary | ICD-10-CM | POA: Diagnosis present

## 2022-03-19 DIAGNOSIS — E876 Hypokalemia: Secondary | ICD-10-CM | POA: Diagnosis present

## 2022-03-19 DIAGNOSIS — I495 Sick sinus syndrome: Secondary | ICD-10-CM | POA: Diagnosis present

## 2022-03-19 DIAGNOSIS — I5032 Chronic diastolic (congestive) heart failure: Secondary | ICD-10-CM | POA: Diagnosis present

## 2022-03-19 DIAGNOSIS — Z79899 Other long term (current) drug therapy: Secondary | ICD-10-CM | POA: Diagnosis not present

## 2022-03-19 DIAGNOSIS — U071 COVID-19: Secondary | ICD-10-CM | POA: Diagnosis present

## 2022-03-19 DIAGNOSIS — Z681 Body mass index (BMI) 19 or less, adult: Secondary | ICD-10-CM | POA: Diagnosis not present

## 2022-03-19 DIAGNOSIS — I110 Hypertensive heart disease with heart failure: Secondary | ICD-10-CM | POA: Diagnosis present

## 2022-03-19 DIAGNOSIS — Z833 Family history of diabetes mellitus: Secondary | ICD-10-CM | POA: Diagnosis not present

## 2022-03-19 DIAGNOSIS — R627 Adult failure to thrive: Secondary | ICD-10-CM | POA: Diagnosis present

## 2022-03-19 DIAGNOSIS — Z7982 Long term (current) use of aspirin: Secondary | ICD-10-CM | POA: Diagnosis not present

## 2022-03-19 DIAGNOSIS — E785 Hyperlipidemia, unspecified: Secondary | ICD-10-CM | POA: Diagnosis present

## 2022-03-19 DIAGNOSIS — E8889 Other specified metabolic disorders: Secondary | ICD-10-CM | POA: Diagnosis present

## 2022-03-19 DIAGNOSIS — G9341 Metabolic encephalopathy: Secondary | ICD-10-CM | POA: Diagnosis present

## 2022-03-19 DIAGNOSIS — R32 Unspecified urinary incontinence: Secondary | ICD-10-CM | POA: Diagnosis present

## 2022-03-19 DIAGNOSIS — E871 Hypo-osmolality and hyponatremia: Secondary | ICD-10-CM | POA: Diagnosis present

## 2022-03-19 DIAGNOSIS — Z8249 Family history of ischemic heart disease and other diseases of the circulatory system: Secondary | ICD-10-CM | POA: Diagnosis not present

## 2022-03-19 DIAGNOSIS — F32A Depression, unspecified: Secondary | ICD-10-CM | POA: Diagnosis present

## 2022-03-19 LAB — CBC
HCT: 44 % (ref 36.0–46.0)
Hemoglobin: 14.2 g/dL (ref 12.0–15.0)
MCH: 28.4 pg (ref 26.0–34.0)
MCHC: 32.3 g/dL (ref 30.0–36.0)
MCV: 88 fL (ref 80.0–100.0)
Platelets: 213 10*3/uL (ref 150–400)
RBC: 5 MIL/uL (ref 3.87–5.11)
RDW: 13.9 % (ref 11.5–15.5)
WBC: 4.5 10*3/uL (ref 4.0–10.5)
nRBC: 0 % (ref 0.0–0.2)

## 2022-03-19 LAB — D-DIMER, QUANTITATIVE: D-Dimer, Quant: 1.49 ug/mL-FEU — ABNORMAL HIGH (ref 0.00–0.50)

## 2022-03-19 LAB — TSH: TSH: 1.397 u[IU]/mL (ref 0.350–4.500)

## 2022-03-19 LAB — CREATININE, SERUM
Creatinine, Ser: 0.81 mg/dL (ref 0.44–1.00)
GFR, Estimated: >60 mL/min (ref >60)

## 2022-03-19 LAB — FIBRINOGEN: Fibrinogen: 396 mg/dL (ref 210–475)

## 2022-03-19 LAB — C-REACTIVE PROTEIN: CRP: 1.3 mg/dL — ABNORMAL HIGH (ref <1.0)

## 2022-03-19 LAB — LACTATE DEHYDROGENASE: LDH: 244 U/L — ABNORMAL HIGH (ref 98–192)

## 2022-03-19 LAB — FERRITIN: Ferritin: 59 ng/mL (ref 11–307)

## 2022-03-19 LAB — PROCALCITONIN: Procalcitonin: <0.1 ng/mL

## 2022-03-19 MED ORDER — POTASSIUM CL IN DEXTROSE 5% 20 MEQ/L IV SOLN
20.0000 meq | INTRAVENOUS | Status: DC
  Administered 2022-03-19: 20 meq via INTRAVENOUS
  Filled 2022-03-19: qty 1000

## 2022-03-19 MED ORDER — EZETIMIBE 10 MG PO TABS
10.0000 mg | ORAL_TABLET | Freq: Every day | ORAL | Status: DC
  Administered 2022-03-19 – 2022-03-26 (×8): 10 mg via ORAL
  Filled 2022-03-19 (×8): qty 1

## 2022-03-19 MED ORDER — BACLOFEN 10 MG PO TABS
10.0000 mg | ORAL_TABLET | Freq: Two times a day (BID) | ORAL | Status: DC | PRN
  Administered 2022-03-20: 10 mg via ORAL
  Filled 2022-03-19: qty 1

## 2022-03-19 MED ORDER — ONDANSETRON HCL 4 MG PO TABS: 4.0000 mg | ORAL_TABLET | Freq: Four times a day (QID) | ORAL | Status: DC | PRN

## 2022-03-19 MED ORDER — ACETAMINOPHEN 500 MG PO TABS: 500.0000 mg | ORAL_TABLET | Freq: Four times a day (QID) | ORAL | Status: DC | PRN

## 2022-03-19 MED ORDER — ACETAMINOPHEN 650 MG RE SUPP: 650.0000 mg | Freq: Four times a day (QID) | RECTAL | Status: DC | PRN

## 2022-03-19 MED ORDER — NIRMATRELVIR/RITONAVIR (PAXLOVID)TABLET
3.0000 | ORAL_TABLET | Freq: Two times a day (BID) | ORAL | Status: AC
  Administered 2022-03-19 – 2022-03-23 (×10): 3 via ORAL
  Filled 2022-03-19 (×2): qty 30

## 2022-03-19 MED ORDER — AMANTADINE HCL 100 MG PO CAPS
100.0000 mg | ORAL_CAPSULE | Freq: Two times a day (BID) | ORAL | Status: DC
  Administered 2022-03-19: 100 mg via ORAL
  Filled 2022-03-19 (×3): qty 1

## 2022-03-19 MED ORDER — LOSARTAN POTASSIUM 50 MG PO TABS
100.0000 mg | ORAL_TABLET | Freq: Every day | ORAL | Status: DC
  Administered 2022-03-19 – 2022-03-26 (×8): 100 mg via ORAL
  Filled 2022-03-19 (×8): qty 2

## 2022-03-19 MED ORDER — ONDANSETRON HCL 4 MG/2ML IJ SOLN
4.0000 mg | Freq: Four times a day (QID) | INTRAMUSCULAR | Status: DC | PRN
  Filled 2022-03-19: qty 2

## 2022-03-19 MED ORDER — ASPIRIN 81 MG PO TBEC
81.0000 mg | DELAYED_RELEASE_TABLET | Freq: Every day | ORAL | Status: DC
  Administered 2022-03-19 – 2022-03-26 (×8): 81 mg via ORAL
  Filled 2022-03-19 (×8): qty 1

## 2022-03-19 MED ORDER — ACETAMINOPHEN 325 MG PO TABS
650.0000 mg | ORAL_TABLET | Freq: Four times a day (QID) | ORAL | Status: DC | PRN
  Administered 2022-03-19 – 2022-03-25 (×9): 650 mg via ORAL
  Filled 2022-03-19 (×9): qty 2

## 2022-03-19 MED ORDER — ALBUTEROL SULFATE (2.5 MG/3ML) 0.083% IN NEBU: 2.5000 mg | INHALATION_SOLUTION | RESPIRATORY_TRACT | Status: DC | PRN

## 2022-03-19 MED ORDER — ENOXAPARIN SODIUM 30 MG/0.3ML IJ SOSY
30.0000 mg | PREFILLED_SYRINGE | INTRAMUSCULAR | Status: DC
  Administered 2022-03-19 – 2022-03-25 (×7): 30 mg via SUBCUTANEOUS
  Filled 2022-03-19 (×7): qty 0.3

## 2022-03-19 NOTE — Progress Notes (Signed)
PROGRESS NOTE  Brief Narrative: Mandy Durham is a 77 y.o. female with a history of atypical PD with dysarthria, HTN, HLD, SSS s/p PPM, autonomic instability, HFpEF, traumatic SAH with resulting TBI, and prediabetes who presented from home to ED 1/3 with weakness with poor appetite found to have covid-19 infection without pneumonia or respiratory symptoms/hypoxia. Paxlovid was started, IVF given and the patient was admitted this morning.  Subjective: Still feels diffusely weak but also notes some easy fatiguability and dyspnea on exertion gradually developing in the past 8-9 months. No leg swelling, chest pain, shortness of breath at rest. No orthopnea.   Objective: BP (!) 148/66   Pulse 89   Temp 98 F (36.7 C) (Oral)   Resp 17   Ht '5\' 5"'$  (1.651 m)   Wt 47.6 kg Comment: from Nov 2023 records  SpO2 99%   BMI 17.47 kg/m   Gen: Elderly, frail female in no acute distress. Looks underweight, particularly in comparison to picture in epic. Pulm: Clear and nonlabored on room air  CV: RRR, no murmur, no JVD, no edema GI: Soft, NT, ND, +BS  Neuro: Alert and oriented. +stuttering speech, but is cooperative without focal deficits. Skin: No rashes, lesions or ulcers on visualized skin  Assessment & Plan: Covid-19 infection: At high risk for progression, though not currently with evidence of pneumonia/respiratory distress. CRP and d-dimer modestly elevated with normal fibrinogen, lymphopenia noted. LFTs wnl. No hypoxia, tachycardia, respiratory complaints or pleuritic chest pain, so doubt PE.  - Paxlovid x5 days (1/4 - 1/8) - Ppx lovenox - Airborne isolation - Incentive spirometry - Do not feel there is utility in trending these inflammatory markers unless clinical status changes.  Generalized weakness, failure to thrive. BMI is 17, looks underweight as well, reports several months of dyspnea on exertion. Ketonuria consistent with fasting ketosis. - RD consult - PT/OT. Pt has significant  caregiver burden with her elderly husband, may benefit from SNF rehabilitation.  - Continue IVF this AM, can stop if taking better/adequate po. - Continue home medications for movement disorder. Due to tremor, cardiac monitoring is of limited utility. She has PPM and no sustained dysrhythmias thus far. To facilitate mobility, will DC tele.  Hematuria: Recheck at follow up.  Patrecia Pour, MD Pager on amion 03/19/2022, 10:14 AM

## 2022-03-19 NOTE — ED Notes (Signed)
Patient able to answer all questions appropriately. Patient just slow to answer questions.

## 2022-03-19 NOTE — H&P (Addendum)
History and Physical    Mandy Durham WVP:[id number] DOB: [birth year] DOA: 03/18/2022  PCP: Colon Branch, MD  Patient coming from: home  I have personally briefly reviewed patient's old medical records in [place]  Chief Complaint: weakness/fatigue unable to do ADLS  HPI: Mandy Durham is a 77 y.o. female with medical history significant of  Atypical parkinson's with associated speech difficulties, HLD, HTN, HLD, sick sinus syndrome s/p pacemaker, hx of autonomic insufficiency, CHFpef, Hx of traumatic SAH with resulting TBI, prediabetes who presents to ED BIB EMS due to increase generalized weakness and fatigue unable to complete ADLS. In field per EMS patient was alter to self.  Patient currently denies, cough/sob/ chest pain/ does note decrease appetite but currently states she would like to eat/ no diarrhea / no abdominal pain or chest pain .   ED Course:  IN ed on evaluation patient was found to have +Respiratory panel : COVID-19 Vitals:  Temp 100.4, bp 159/ 89, hr 87, rr 18 sat 100%  Wbc 5.9/ hb 12.4, plt 189 CXR: NAD UA : mod blood, ketones 20 Na 134, K 3.5, bicarb 21, cr 0.85,  EKG: poor baseline repeat pending( due to tremors) Review of Systems: As per HPI otherwise 10 point review of systems negative.   Past Medical History:  Diagnosis Date   Anxiety state, unspecified    Hyperlipidemia    Hypertension    Insomnia    LBBB (left bundle branch block)    LHC in 2002 showed normal coronaries.    Osteoarthrosis, unspecified whether generalized or localized, unspecified site    Overweight(278.02)    Parkinson disease    S/P placement of cardiac pacemaker    a. 02/21/16: [employer]onic Advisa DR MRI SureScan model N4OE70 (serial number [id number])    Serrated adenoma of colon 2007   Symptomatic menopausal or female climacteric states    Syncope and collapse    11/12: Holter 12/12 with rare PACS, HR range 64-140, average 82, no significant arrhythmias. Echo (1/13): EF  50-55%, mild LVH, septal-lateral dyssynchrony c/w LBBB. 3-week event monitor (1/13): No significant arrhythmia.     Past Surgical History:  Procedure Laterality Date   COLONOSCOPY  2007   EP IMPLANTABLE DEVICE N/A 02/21/2016   Procedure: Pacemaker Implant;  Surgeon: Will Meredith Leeds, MD;  Location: [facility];  Service: Cardiovascular;  Laterality: N/A;   POLYPECTOMY       reports that she has never smoked. She has never been exposed to tobacco smoke. She has never used smokeless tobacco. She reports that she does not drink alcohol and does not use drugs.  No Known Allergies  Family History  Problem Relation Age of Onset   Heart failure Mother    Coronary artery disease Mother    Dementia Mother    Diabetes Mother    Colon cancer Neg Hx    Breast cancer Neg Hx    Hypertension Neg Hx    Heart disease Neg Hx    Heart attack Neg Hx    Stroke Neg Hx    Rectal cancer Neg Hx    Stomach cancer Neg Hx    Esophageal cancer Neg Hx    Liver disease Neg Hx    Parkinson's disease Neg Hx     Prior to Admission medications   Medication Sig Start Date End Date Taking? Authorizing Provider  nirmatrelvir/ritonavir (PAXLOVID) 20 x 150 MG & 10 x '100MG'$  TABS Take 3 tablets by mouth 2 (  two) times daily for 5 days. Patient GFR is >60. Take nirmatrelvir (150 mg) two tablets twice daily for 5 days and ritonavir (100 mg) one tablet twice daily for 5 days. 03/18/22 03/23/22 Yes Leanord Asal K, DO  acetaminophen (TYLENOL) 500 MG tablet Take 500 mg by mouth every 6 (six) hours as needed.    [provider]  amantadine (SYMMETREL) 100 MG capsule Take 1 capsule (100 mg total) by mouth 2 (two) times daily. 12/18/21   Dohmeier, Asencion Partridge, MD  aspirin EC 81 MG tablet Take 81 mg by mouth daily. Swallow whole.    [provider]  baclofen (LIORESAL) 10 MG tablet Take 1 tablet (10 mg total) by mouth 2 (two) times daily as needed for muscle spasms. 10/13/21   Ward Givens, NP   Budeson-Glycopyrrol-Formoterol (BREZTRI AEROSPHERE) 160-9-4.8 MCG/ACT AERO Inhale 2 puffs into the lungs in the morning and at bedtime. 08/12/21   [name], [name], MD  carbidopa-levodopa (SINEMET IR) 25-100 MG tablet Take 1 tablet by mouth 4 times daily at 8am/noon/4pm/8pm 07/30/21   Tat, Eustace Quail, DO  Cholecalciferol (VITAMIN D PO) Take 1 tablet by mouth 2 (two) times a week. Vitamin D    [provider]  clonazePAM (KLONOPIN) 0.25 MG disintegrating tablet Take 1 tablet (0.25 mg total) by mouth 2 (two) times daily as needed (Anxiety). 12/03/21   Colon Branch, MD  escitalopram (LEXAPRO) 10 MG tablet Take 1 tablet (10 mg total) by mouth daily. 12/02/21   Colon Branch, MD  ezetimibe (ZETIA) 10 MG tablet Take 1 tablet (10 mg total) by mouth daily. 01/22/21   Colon Branch, MD  furosemide (LASIX) 20 MG tablet Take 1 tablet (20 mg total) by mouth daily. Alternating 40 mg every other day 03/26/21   Baldwin Jamaica, PA-C  losartan (COZAAR) 100 MG tablet Take 1 tablet (100 mg total) by mouth daily. 10/27/21   Colon Branch, MD  nitroGLYCERIN (NITROSTAT) 0.4 MG SL tablet Place 1 tablet (0.4 mg total) under the tongue every 5 (five) minutes x 3 doses as needed for chest pain. Patient not taking: Reported on 01/28/2022 08/26/21   Baldwin Jamaica, PA-C  potassium chloride (KLOR-CON) 10 MEQ tablet Take 1 tablet (10 mEq total) by mouth daily. 06/20/21   Baldwin Jamaica, PA-C  traMADol (ULTRAM) 50 MG tablet Take 50 mg by mouth every 6 (six) hours as needed. Patient not taking: Reported on 01/28/2022 05/20/21   [provider]    Physical Exam: Vitals:   03/19/22 0152 03/19/22 0300 03/19/22 0425 03/19/22 0551  BP: (!) 150/70 (!) 165/74 (!) 155/75   Pulse: 80 71 73   Resp: '18 18 18   '$ Temp: 99.6 F (37.6 C)   97.9 F (36.6 C)  TempSrc:    Oral  SpO2: 98% 98% 100%     Constitutional: NAD, calm, comfortable Vitals:   03/19/22 0152 03/19/22 0300 03/19/22 0425 03/19/22 0551  BP: (!) 150/70 (!)  165/74 (!) 155/75   Pulse: 80 71 73   Resp: '18 18 18   '$ Temp: 99.6 F (37.6 C)   97.9 F (36.6 C)  TempSrc:    Oral  SpO2: 98% 98% 100%    Eyes: PERRL, lids and conjunctivae normal ENMT: Mucous membranes are moist. Posterior pharynx clear of any exudate or lesions.Normal dentition.  Neck: normal, supple, no masses, no thyromegaly Respiratory: clear to auscultation bilaterally, no wheezing, no crackles. Normal respiratory effort. No accessory muscle use.  Cardiovascular: Regular rate  and rhythm, no murmurs / rubs / gallops. No extremity edema. 2+ pedal pulses.  Abdomen: no tenderness, no masses palpated. No hepatosplenomegaly. Bowel sounds positive.  Musculoskeletal: no clubbing / cyanosis. No joint deformity upper and lower extremities. Good ROM, no contractures. Normal muscle tone.  Skin: no rashes, lesions, ulcers. No induration Neurologic: CN 2-12 grossly intact. Sensation intact,Strength 5/5 in all 4.  Psychiatric: alert to self and place  Labs on Admission: I have personally reviewed following labs and imaging studies  CBC: Recent Labs  Lab 03/18/22 2152  WBC 5.9  NEUTROABS 4.6  HGB 12.4  HCT 40.2  MCV 90.7  PLT 449   Basic Metabolic Panel: Recent Labs  Lab 03/18/22 2257  NA 134*  K 3.5  CL 103  CO2 21*  GLUCOSE 77  BUN 16  CREATININE 0.85  CALCIUM 8.6*   GFR: CrCl cannot be calculated (Unknown ideal weight.). Liver Function Tests: Recent Labs  Lab 03/18/22 2257  AST 35  ALT 23  ALKPHOS 73  BILITOT 0.7  PROT 6.3*  ALBUMIN 3.4*   No results for input(s): "LIPASE", "AMYLASE" in the last 168 hours. No results for input(s): "AMMONIA" in the last 168 hours. Coagulation Profile: No results for input(s): "INR", "PROTIME" in the last 168 hours. Cardiac Enzymes: No results for input(s): "CKTOTAL", "CKMB", "CKMBINDEX", "TROPONINI" in the last 168 hours. BNP (last 3 results) No results for input(s): "PROBNP" in the last 8760 hours. HbA1C: No results for  input(s): "HGBA1C" in the last 72 hours. CBG: No results for input(s): "GLUCAP" in the last 168 hours. Lipid Profile: No results for input(s): "CHOL", "HDL", "LDLCALC", "TRIG", "CHOLHDL", "LDLDIRECT" in the last 72 hours. Thyroid Function Tests: No results for input(s): "TSH", "T4TOTAL", "FREET4", "T3FREE", "THYROIDAB" in the last 72 hours. Anemia Panel: No results for input(s): "VITAMINB12", "FOLATE", "FERRITIN", "TIBC", "IRON", "RETICCTPCT" in the last 72 hours. Urine analysis:    Component Value Date/Time   COLORURINE YELLOW 03/18/2022 2238   APPEARANCEUR CLEAR 03/18/2022 2238   LABSPEC 1.015 03/18/2022 2238   PHURINE 6.0 03/18/2022 2238   GLUCOSEU NEGATIVE 03/18/2022 2238   GLUCOSEU NEGATIVE 01/28/2007 1200   HGBUR MODERATE (A) 03/18/2022 2238   BILIRUBINUR NEGATIVE 03/18/2022 2238   KETONESUR 20 (A) 03/18/2022 2238   PROTEINUR NEGATIVE 03/18/2022 2238   UROBILINOGEN 0.2 01/25/2015 0915   NITRITE NEGATIVE 03/18/2022 2238   LEUKOCYTESUR NEGATIVE 03/18/2022 2238    Radiological Exams on Admission: DG Chest 1 View  Result Date: 03/18/2022 CLINICAL DATA:  Urinary incontinence EXAM: PORTABLE CHEST 1 VIEW COMPARISON:  04/14/2021 FINDINGS: Cardiac shadow is at the upper limits of normal in size. Pacing device is noted. Lungs are clear bilaterally. No bony abnormality is seen. IMPRESSION: No active disease. Electronically Signed   By: Inez Catalina M.D.   On: 03/18/2022 22:23    EKG: Independently reviewed. See above  Assessment/Plan  COVID viral infection with Acute Encephalopathy -patient w/o any pulmonary or gi sxs  -will admit as patient is unable to take care of herself  - start antiviral  -monitor on continuous pulse ox  -no need for steroids at this time  -encourage po fluid intake  -daily monitoring of COVID labs  -d-dimer pending  -dvt ppx per protocol    Atypical parkinson's - with associated speech difficulties -dystonia /torticollis -continue home regimen   (amantadine,baclofen) -followed by neurology outpatient    HLD -continue zetia    HTN -stable continue losartan     Sick sinus syndrome s/p pacemaker  Hx of autonomic insufficiency -resume home regimen     CHFpef -no acute exacerbation     Hx of traumatic SAH with resulting TBI, prediabetes  DVT prophylaxis: lovenox Code Status: full default Family Communication:  none at bedside  Disposition Plan:.patient  expected to be admitted greater than 2 midnights  Consults called: PT/OT Admission status: med tele   Clance Boll MD [facility]  If 7PM-7AM, please contact night-coverage [email or web address] Password [email or web address]  03/19/2022, 6:03 AM

## 2022-03-19 NOTE — Telephone Encounter (Addendum)
Pt actually admitted to hosp w/ COVID 19 last night.

## 2022-03-19 NOTE — ED Notes (Signed)
ED TO INPATIENT HANDOFF REPORT  ED Nurse Name and Phone #: Martrell Eguia RN [phone]  S Name/Age/Gender Mandy Durham 77 y.o. female Room/Bed: 043C/043C  Code Status   Code Status: Full Code  Home/SNF/Other Home Patient oriented to: self, place, time, and situation Is this baseline? Yes   Triage Complete: Triage complete  Chief Complaint COVID-19 [U07.1]  Triage Note Per EMS patient coming from home patient is normally ambulatory and A&Ox4. Per EMS patient has been laying in bed all day and incontinent of urine with is abnormal for her. Patient with EMS is a&Ox self. Per EMS aphasia is normal for the patient. BP 160/90 HR 90 (has pacemaker)    Allergies No Known Allergies  Level of Care/Admitting Diagnosis ED Disposition     ED Disposition  Admit   Condition  --   Comment  Hospital Area: [facility] [[id number]]  Level of Care: Med-Surg [16]  May admit patient to [facility] or [facility] if equivalent level of care is available:: No  Covid Evaluation: Confirmed COVID Positive  Diagnosis: COVID-19 [[id number]]  Admitting Physician: Patrecia Pour [phone]  Attending Physician: Patrecia Pour [[id number]]  Certification:: I certify this patient will need inpatient services for at least 2 midnights          B Medical/Surgery History Past Medical History:  Diagnosis Date   Anxiety state, unspecified    Hyperlipidemia    Hypertension    Insomnia    LBBB (left bundle branch block)    LHC in 2002 showed normal coronaries.    Osteoarthrosis, unspecified whether generalized or localized, unspecified site    Overweight(278.02)    Parkinson disease    S/P placement of cardiac pacemaker    a. 02/21/16: Medtronic Advisa DR MRI SureScan model N6EX52 (serial number [id number])    Serrated adenoma of colon 2007   Symptomatic menopausal or female climacteric states    Syncope and collapse    11/12: Holter 12/12 with rare PACS, HR range 64-140, average 82, no significant  arrhythmias. Echo (1/13): EF 50-55%, mild LVH, septal-lateral dyssynchrony c/w LBBB. 3-week event monitor (1/13): No significant arrhythmia.    Past Surgical History:  Procedure Laterality Date   COLONOSCOPY  2007   EP IMPLANTABLE DEVICE N/A 02/21/2016   Procedure: Pacemaker Implant;  Surgeon: Will Meredith Leeds, MD;  Location: [facility];  Service: Cardiovascular;  Laterality: N/A;   POLYPECTOMY       A IV Location/Drains/Wounds Patient Lines/Drains/Airways Status     Active Line/Drains/Airways     Name Placement date Placement time Site Days   Peripheral IV 03/18/22 18 G Right Antecubital 03/18/22  2140  Antecubital  1   External Urinary Catheter 03/19/22  0205  --  less than 1   Incision (Closed) 02/21/16 Chest Left;Upper 02/21/16  1830  -- 2218            Intake/Output Last 24 hours No intake or output data in the 24 hours ending 03/19/22 1137  Labs/Imaging Results for orders placed or performed during the hospital encounter of 03/18/22 (from the past 48 hour(s))  Resp panel by RT-PCR (RSV, Flu A&B, Covid) Anterior Nasal Swab     Status: Abnormal   Collection Time: 03/18/22  9:49 PM   Specimen: Anterior Nasal Swab  Result Value Ref Range   SARS Coronavirus 2 by RT PCR POSITIVE (A) NEGATIVE    Comment: (NOTE) SARS-CoV-2 target nucleic acids are DETECTED.  The SARS-CoV-2 RNA  is generally detectable in upper respiratory specimens during the acute phase of infection. Positive results are indicative of the presence of the identified virus, but do not rule out bacterial infection or co-infection with other pathogens not detected by the test. Clinical correlation with patient history and other diagnostic information is necessary to determine patient infection status. The expected result is Negative.  Fact Sheet for Patients: EntrepreneurPulse.com.au  Fact Sheet for Healthcare Providers: IncredibleEmployment.be  This test is  not yet approved or cleared by the [place] FDA and  has been authorized for detection and/or diagnosis of SARS-CoV-2 by FDA under an Emergency Use Authorization (EUA).  This EUA will remain in effect (meaning this test can be used) for the duration of  the COVID-19 declaration under Section 564(b)(1) of the A ct, 21 U.S.C. section 360bbb-3(b)(1), unless the authorization is terminated or revoked sooner.     Influenza A by PCR NEGATIVE NEGATIVE   Influenza B by PCR NEGATIVE NEGATIVE    Comment: (NOTE) The Xpert Xpress SARS-CoV-2/FLU/RSV plus assay is intended as an aid in the diagnosis of influenza from Nasopharyngeal swab specimens and should not be used as a sole basis for treatment. Nasal washings and aspirates are unacceptable for Xpert Xpress SARS-CoV-2/FLU/RSV testing.  Fact Sheet for Patients: EntrepreneurPulse.com.au  Fact Sheet for Healthcare Providers: IncredibleEmployment.be  This test is not yet approved or cleared by the [place] FDA and has been authorized for detection and/or diagnosis of SARS-CoV-2 by FDA under an Emergency Use Authorization (EUA). This EUA will remain in effect (meaning this test can be used) for the duration of the COVID-19 declaration under Section 564(b)(1) of the Act, 21 U.S.C. section 360bbb-3(b)(1), unless the authorization is terminated or revoked.     Resp Syncytial Virus by PCR NEGATIVE NEGATIVE    Comment: (NOTE) Fact Sheet for Patients: EntrepreneurPulse.com.au  Fact Sheet for Healthcare Providers: IncredibleEmployment.be  This test is not yet approved or cleared by the [place] FDA and has been authorized for detection and/or diagnosis of SARS-CoV-2 by FDA under an Emergency Use Authorization (EUA). This EUA will remain in effect (meaning this test can be used) for the duration of the COVID-19 declaration under Section 564(b)(1) of the Act, 21  U.S.C. section 360bbb-3(b)(1), unless the authorization is terminated or revoked.  Performed at [facility], [place] [address] [place], [place] [address]   CBC with Differential     Status: Abnormal   Collection Time: 03/18/22  9:52 PM  Result Value Ref Range   WBC 5.9 4.0 - 10.5 K/uL   RBC 4.43 3.87 - 5.11 MIL/uL   Hemoglobin 12.4 12.0 - 15.0 g/dL   HCT 40.2 36.0 - 46.0 %   MCV 90.7 80.0 - 100.0 fL   MCH 28.0 26.0 - 34.0 pg   MCHC 30.8 30.0 - 36.0 g/dL   RDW 14.1 11.5 - 15.5 %   Platelets 189 150 - 400 K/uL   nRBC 0.0 0.0 - 0.2 %   Neutrophils Relative % 77 %   Neutro Abs 4.6 1.7 - 7.7 K/uL   Lymphocytes Relative 9 %   Lymphs Abs 0.5 (L) 0.7 - 4.0 K/uL   Monocytes Relative 14 %   Monocytes Absolute 0.8 0.1 - 1.0 K/uL   Eosinophils Relative 0 %   Eosinophils Absolute 0.0 0.0 - 0.5 K/uL   Basophils Relative 0 %   Basophils Absolute 0.0 0.0 - 0.1 K/uL   Immature Granulocytes 0 %   Abs Immature Granulocytes 0.02 0.00 -  0.07 K/uL    Comment: Performed at [facility], [place] [address], [place], [place] [address]  Urinalysis, Routine w reflex microscopic     Status: Abnormal   Collection Time: 03/18/22 10:38 PM  Result Value Ref Range   Color, Urine YELLOW YELLOW   APPearance CLEAR CLEAR   Specific Gravity, Urine 1.015 1.005 - 1.030   pH 6.0 5.0 - 8.0   Glucose, UA NEGATIVE NEGATIVE mg/dL   Hgb urine dipstick MODERATE (A) NEGATIVE   Bilirubin Urine NEGATIVE NEGATIVE   Ketones, ur 20 (A) NEGATIVE mg/dL   Protein, ur NEGATIVE NEGATIVE mg/dL   Nitrite NEGATIVE NEGATIVE   Leukocytes,Ua NEGATIVE NEGATIVE   RBC / HPF 11-20 0 - 5 RBC/hpf   WBC, UA 0-5 0 - 5 WBC/hpf   Bacteria, UA NONE SEEN NONE SEEN   Squamous Epithelial / HPF 0-5 0 - 5 /HPF   Mucus PRESENT     Comment: Performed at [facility], [place] [address] [place], [place] [address]  Comprehensive metabolic panel     Status: Abnormal   Collection Time: 03/18/22 10:57 PM  Result Value Ref Range   Sodium  134 (L) 135 - 145 mmol/L   Potassium 3.5 3.5 - 5.1 mmol/L   Chloride 103 98 - 111 mmol/L   CO2 21 (L) 22 - 32 mmol/L   Glucose, Bld 77 70 - 99 mg/dL    Comment: Glucose reference range applies only to samples taken after fasting for at least 8 hours.   BUN 16 8 - 23 mg/dL   Creatinine, Ser 0.85 0.44 - 1.00 mg/dL   Calcium 8.6 (L) 8.9 - 10.3 mg/dL   Total Protein 6.3 (L) 6.5 - 8.1 g/dL   Albumin 3.4 (L) 3.5 - 5.0 g/dL   AST 35 15 - 41 U/L   ALT 23 0 - 44 U/L   Alkaline Phosphatase 73 38 - 126 U/L   Total Bilirubin 0.7 0.3 - 1.2 mg/dL   GFR, Estimated >60 >60 mL/min    Comment: (NOTE) Calculated using the CKD-EPI Creatinine Equation (2021)    Anion gap 10 5 - 15    Comment: Performed at [facility], [place] [address] [place], [place] [address]  CBC     Status: None   Collection Time: 03/19/22  7:30 AM  Result Value Ref Range   WBC 4.5 4.0 - 10.5 K/uL   RBC 5.00 3.87 - 5.11 MIL/uL   Hemoglobin 14.2 12.0 - 15.0 g/dL   HCT 44.0 36.0 - 46.0 %   MCV 88.0 80.0 - 100.0 fL   MCH 28.4 26.0 - 34.0 pg   MCHC 32.3 30.0 - 36.0 g/dL   RDW 13.9 11.5 - 15.5 %   Platelets 213 150 - 400 K/uL   nRBC 0.0 0.0 - 0.2 %    Comment: Performed at [facility], [place] [address] [place], [place] [address]  Creatinine, serum     Status: None   Collection Time: 03/19/22  7:30 AM  Result Value Ref Range   Creatinine, Ser 0.81 0.44 - 1.00 mg/dL   GFR, Estimated >60 >60 mL/min    Comment: (NOTE) Calculated using the CKD-EPI Creatinine Equation (2021) Performed at [facility] [address], [place], [place] [address]   TSH     Status: None   Collection Time: 03/19/22  7:30 AM  Result Value Ref Range   TSH 1.397 0.350 - 4.500 uIU/mL    Comment:  Performed by a 3rd Generation assay with a functional sensitivity of <=0.01 uIU/mL. Performed at [facility], [place] [address], [place], [place] [address]   C-reactive protein     Status: Abnormal   Collection Time: 03/19/22  7:30 AM   Result Value Ref Range   CRP 1.3 (H) <1.0 mg/dL    Comment: Performed at [facility] [address], [place], [place] [address]  D-dimer, quantitative     Status: Abnormal   Collection Time: 03/19/22  7:30 AM  Result Value Ref Range   D-Dimer, Quant 1.49 (H) 0.00 - 0.50 ug/mL-FEU    Comment: (NOTE) At the manufacturer cut-off value of 0.5 g/mL FEU, this assay has a negative predictive value of 95-100%.This assay is intended for use in conjunction with a clinical pretest probability (PTP) assessment model to exclude pulmonary embolism (PE) and deep venous thrombosis (DVT) in outpatients suspected of PE or DVT. Results should be correlated with clinical presentation. Performed at [facility], [place] [address], [place], [place] [address]   Ferritin     Status: None   Collection Time: 03/19/22  7:30 AM  Result Value Ref Range   Ferritin 59 11 - 307 ng/mL    Comment: Performed at [facility] [address] [place], [place] [address]  Fibrinogen     Status: None   Collection Time: 03/19/22  7:30 AM  Result Value Ref Range   Fibrinogen 396 210 - 475 mg/dL    Comment: (NOTE) Fibrinogen results may be underestimated in patients receiving thrombolytic therapy. Performed at [facility], [place] [address], [place], [place] [address]   Lactate dehydrogenase     Status: Abnormal   Collection Time: 03/19/22  7:30 AM  Result Value Ref Range   LDH 244 (H) 98 - 192 U/L    Comment: Performed at [facility], [place] [address] [place],  [address]   *Note: Due to a large number of results and/or encounters for the requested time period, some results have not been displayed. A complete set of results can be found in Results Review.   DG Chest 1 View  Result Date: 03/18/2022 CLINICAL DATA:  Urinary incontinence EXAM: PORTABLE CHEST 1 VIEW COMPARISON:  04/14/2021 FINDINGS: Cardiac shadow is at the upper limits of normal in size. Pacing device is noted. Lungs  are clear bilaterally. No bony abnormality is seen. IMPRESSION: No active disease. Electronically Signed   By: Inez Catalina M.D.   On: 03/18/2022 22:23    Pending Labs Unresulted Labs (From admission, onward)     Start     Ordered   03/26/22 0500  Creatinine, serum  (enoxaparin (LOVENOX)    CrCl >/= 30 ml/min)  Weekly,   R     Comments: while on enoxaparin therapy    03/19/22 0644   03/20/22 1448  Basic metabolic panel  Tomorrow morning,   R        03/19/22 0644   03/20/22 0500  Magnesium  Daily,   R      03/19/22 0648   03/20/22 0500  Phosphorus  Daily,   R      03/19/22 0648   03/19/22 0648  Procalcitonin  Once,   R        03/19/22 0648            Vitals/Pain Today's Vitals   03/19/22 0700 03/19/22 0825 03/19/22 0900 03/19/22 1000  BP: (!) 167/84  Marland Kitchen)  170/94 (!) 148/66  Pulse: 84  85 89  Resp: '17  19 17  '$ Temp:  98 F (36.7 C)    TempSrc:  Oral    SpO2: 100%  97% 99%  Weight: 47.6 kg     Height: '5\' 5"'$  (1.651 m)       Isolation Precautions Airborne and Contact precautions  Medications Medications  ezetimibe (ZETIA) tablet 10 mg (10 mg Oral Given 03/19/22 0825)  losartan (COZAAR) tablet 100 mg (100 mg Oral Given 03/19/22 0825)  aspirin EC tablet 81 mg (81 mg Oral Given 03/19/22 0825)  amantadine (SYMMETREL) capsule 100 mg (0 mg Oral Hold 03/19/22 0910)  baclofen (LIORESAL) tablet 10 mg (has no administration in time range)  enoxaparin (LOVENOX) injection 30 mg (has no administration in time range)  acetaminophen (TYLENOL) tablet 650 mg (has no administration in time range)    Or  acetaminophen (TYLENOL) suppository 650 mg (has no administration in time range)  ondansetron (ZOFRAN) tablet 4 mg (has no administration in time range)    Or  ondansetron (ZOFRAN) injection 4 mg (has no administration in time range)  albuterol (PROVENTIL) (2.5 MG/3ML) 0.083% nebulizer solution 2.5 mg (has no administration in time range)  nirmatrelvir/ritonavir (PAXLOVID) 3 tablet (3 tablets  Oral Given 03/19/22 0826)  acetaminophen (TYLENOL) tablet 650 mg (650 mg Oral Given 03/18/22 2247)  lactated ringers bolus 1,000 mL (0 mLs Intravenous Stopped 03/19/22 0152)    Mobility walks High fall risk   Focused Assessments Cardiac Assessment Handoff:    Lab Results  Component Value Date   CKTOTAL 194 (H) 07/30/2021   TROPONINI <0.03 02/20/2016   Lab Results  Component Value Date   DDIMER 1.49 (H) 03/19/2022   Does the Patient currently have chest pain? No   , Pulmonary Assessment Handoff:  Lung sounds:   O2 Device: Room Air      R Recommendations: See Admitting Provider Note  Report given to:   Additional Notes:

## 2022-03-19 NOTE — Progress Notes (Signed)
Pt. Arrived to unit alert and oriented to person, place and situation. Daughter notified via phone of admission location. Bed in lowest position call bell within reach

## 2022-03-20 DIAGNOSIS — E440 Moderate protein-calorie malnutrition: Secondary | ICD-10-CM | POA: Insufficient documentation

## 2022-03-20 DIAGNOSIS — U071 COVID-19: Secondary | ICD-10-CM | POA: Diagnosis not present

## 2022-03-20 LAB — BASIC METABOLIC PANEL
Anion gap: 9 (ref 5–15)
BUN: 13 mg/dL (ref 8–23)
CO2: 24 mmol/L (ref 22–32)
Calcium: 8.4 mg/dL — ABNORMAL LOW (ref 8.9–10.3)
Chloride: 99 mmol/L (ref 98–111)
Creatinine, Ser: 0.9 mg/dL (ref 0.44–1.00)
GFR, Estimated: >60 mL/min (ref >60)
Glucose, Bld: 82 mg/dL (ref 70–99)
Potassium: 3 mmol/L — ABNORMAL LOW (ref 3.5–5.1)
Sodium: 132 mmol/L — ABNORMAL LOW (ref 135–145)

## 2022-03-20 LAB — MAGNESIUM: Magnesium: 1.9 mg/dL (ref 1.7–2.4)

## 2022-03-20 LAB — PHOSPHORUS: Phosphorus: 4.5 mg/dL (ref 2.5–4.6)

## 2022-03-20 MED ORDER — ESCITALOPRAM OXALATE 10 MG PO TABS
10.0000 mg | ORAL_TABLET | Freq: Every day | ORAL | Status: DC
  Administered 2022-03-20 – 2022-03-26 (×7): 10 mg via ORAL
  Filled 2022-03-20 (×7): qty 1

## 2022-03-20 MED ORDER — MAGNESIUM SULFATE IN D5W 1-5 GM/100ML-% IV SOLN
1.0000 g | Freq: Once | INTRAVENOUS | Status: AC
  Administered 2022-03-20: 1 g via INTRAVENOUS
  Filled 2022-03-20 (×2): qty 100

## 2022-03-20 MED ORDER — BOOST PLUS PO LIQD
237.0000 mL | Freq: Two times a day (BID) | ORAL | Status: DC
  Administered 2022-03-20 – 2022-03-24 (×7): 237 mL via ORAL
  Filled 2022-03-20 (×13): qty 237

## 2022-03-20 MED ORDER — ADULT MULTIVITAMIN W/MINERALS CH
1.0000 | ORAL_TABLET | Freq: Every day | ORAL | Status: DC
  Administered 2022-03-20 – 2022-03-26 (×7): 1 via ORAL
  Filled 2022-03-20 (×7): qty 1

## 2022-03-20 MED ORDER — CARBIDOPA-LEVODOPA 25-100 MG PO TABS
1.0000 | ORAL_TABLET | Freq: Four times a day (QID) | ORAL | Status: DC
  Administered 2022-03-20 – 2022-03-26 (×24): 1 via ORAL
  Filled 2022-03-20 (×24): qty 1

## 2022-03-20 MED ORDER — POTASSIUM CHLORIDE CRYS ER 20 MEQ PO TBCR
40.0000 meq | EXTENDED_RELEASE_TABLET | Freq: Once | ORAL | Status: AC
  Administered 2022-03-20: 40 meq via ORAL
  Filled 2022-03-20: qty 2

## 2022-03-20 MED ORDER — POTASSIUM CHLORIDE IN NACL 20-0.9 MEQ/L-% IV SOLN
INTRAVENOUS | Status: DC
  Filled 2022-03-20 (×2): qty 1000

## 2022-03-20 NOTE — Progress Notes (Signed)
Inpatient Rehab Admissions Coordinator:    I met with pt. And daughter to discuss potential CIR admit. Daughter states that pt. Has been experiencing cognitive decline and that her husband is not able to manage her at home as he has his own mobility issues. She states that Pt.'s husband is not actually able to provide effective 24/7 supervision and states that family is discussing long term care placement, perhaps an ALF for pt. Due to lack of adequate caregiver support (pt. Will definitely require 24/7 supervision after CIR), I am not able to offer pt. A CIR bed. Recommend that TOC look into SNF for pt.   Clemens Catholic, [place], [place] Admissions Coordinator  [phone] ([place]) [phone] (office)

## 2022-03-20 NOTE — Progress Notes (Signed)
Initial Nutrition Assessment  DOCUMENTATION CODES:   Non-severe (moderate) malnutrition in context of chronic illness, Underweight  INTERVENTION:  Liberalize diet from a heart healthy/carb modified to a regular diet to provide widest variety of menu options to enhance nutritional adequacy Boost Plus po BID, each supplement provides 360 kcal and 14 grams of protein MVI with minerals daily  NUTRITION DIAGNOSIS:   Moderate Malnutrition related to chronic illness (parkinson's, SSS s/p pacemaker, autonomic insufficiency, SAH with TBI) as evidenced by moderate fat depletion, severe muscle depletion.  GOAL:   Patient will meet greater than or equal to 90% of their needs  MONITOR:   PO intake, Supplement acceptance, Diet advancement, Labs, Weight trends  REASON FOR ASSESSMENT:   Consult Poor PO  ASSESSMENT:   Pt admitted with c/o weakness, fatigue and inability to perform ADLS. Found to be COVID+ upon admission.  PMH significant for atypical parkinson's disease with associated speech difficulties, HLD, HTN, sick sinus syndrome s/p pacemaker, hx or autonomic insufficiency, CHF, traumatic SAH with resulting TBI, prediabetes.  Pt screened for CIR however family requesting ALF placement.  Pt sitting in chair. She reports that her appetite is improving. She ate a little more than usual for breakfast today. She states that she has not been eating well for about 1 month as she has not had much of an appetite. Denies difficulty self feeding, chewing or swallowing foods. She occasionally drinks Boost at home and is agreeable to receive Boost or Ensure during visit. Pt requesting a chocolate supplement at time of visit. PT entering room who provided Glucerna for pt as no Ensure currently on unit. Made RN covering front desk aware.   Meal completions: 1/5: 100% breakfast, 60% lunch  Pt reports that her weight has been gradually trending down over time but does not suspect she has had significant  recent weight loss. Reviewed weight history. Within the last year her weight appears to have decreased from 49.5 kg to 47.1 kg. This weight loss is not clinically significant for time frame.   Medications reviewed and include IV NaCl with KCl @ 83m/hr, MgSulfate  Labs: sodium 132, potassium 3.0  NUTRITION - FOCUSED PHYSICAL EXAM:  Flowsheet Row Most Recent Value  Orbital Region Moderate depletion  Upper Arm Region Severe depletion  Thoracic and Lumbar Region Moderate depletion  Buccal Region Moderate depletion  Temple Region Moderate depletion  Clavicle Bone Region Severe depletion  Clavicle and Acromion Bone Region Severe depletion  Scapular Bone Region Severe depletion  Dorsal Hand Severe depletion  Patellar Region Severe depletion  Anterior Thigh Region Severe depletion  Posterior Calf Region Moderate depletion  Edema (RD Assessment) None  Hair Reviewed  Eyes Reviewed  Mouth Reviewed  Skin Reviewed  Nails Reviewed      Diet Order:   Diet Order             Diet regular Room service appropriate? Yes; Fluid consistency: Thin  Diet effective now                   EDUCATION NEEDS:   No education needs have been identified at this time  Skin:  Skin Assessment: Reviewed RN Assessment  Last BM:  1/8 (type 6)  Height:   Ht Readings from Last 1 Encounters:  03/19/22 '5\' 5"'$  (1.651 m)    Weight:   Wt Readings from Last 1 Encounters:  03/20/22 47.1 kg   BMI:  Body mass index is 17.28 kg/m.  Estimated Nutritional Needs:   Kcal:  3614-4315  Protein:  65-80g  Fluid:  1.4-1.6L  Clayborne Dana, RDN, LDN Clinical Nutrition

## 2022-03-20 NOTE — Evaluation (Signed)
Physical Therapy Evaluation Patient Details Name: Jenee YENTY BLOCH MRN: [id number] DOB: 1945/05/20 Today's Date: 03/20/2022  History of Present Illness  Eniola LUCINDIA LEMLEY is a 77 y.o. female with a history of atypical PD with dysarthria, HTN, HLD, SSS s/p PPM, autonomic instability, HFpEF, traumatic SAH with resulting TBI, and prediabetes who presented from home to ED 1/3 with weakness with poor appetite found to have covid-19 infection without pneumonia or respiratory symptoms/hypoxia.  Clinical Impression  Pt admitted with/for weakness and found to have COVID.  Pt is not at baseline functioning and needing min to mod assist depending on activity due to deconditioning/weakness and parkinsons. Pt currently limited functionally due to the problems listed. ( See problems list.)   Pt will benefit from PT to maximize function and safety in order to get ready for next venue listed below.        Recommendations for follow up therapy are one component of a multi-disciplinary discharge planning process, led by the attending physician.  Recommendations may be updated based on patient status, additional functional criteria and insurance authorization.  Follow Up Recommendations Acute inpatient rehab (3hours/day)      Assistance Recommended at Discharge Intermittent Supervision/Assistance  Patient can return home with the following  A little help with walking and/or transfers;A little help with bathing/dressing/bathroom;Assistance with feeding;Assist for transportation;Help with stairs or ramp for entrance    Equipment Recommendations Other (comment) (TBD)  Recommendations for Other Services  Rehab consult    Functional Status Assessment Patient has had a recent decline in their functional status and demonstrates the ability to make significant improvements in function in a reasonable and predictable amount of time.     Precautions / Restrictions Precautions Precautions: Fall Precaution Comments:  Parkinson's, posterior bias, baseline aphasic Restrictions Weight Bearing Restrictions: No      Mobility  Bed Mobility               General bed mobility comments: up in the chair on arrival    Transfers Overall transfer level: Needs assistance Equipment used: Rolling walker (2 wheels) Transfers: Sit to/from Stand Sit to Stand: Mod assist, Min assist           General transfer comment: cues for hand placement, assis forward and with boost, initially mod with progression to minimal assist.    Ambulation/Gait Ambulation/Gait assistance: Min assist Gait Distance (Feet): 8 Feet (then 6 feet forward and back with the RW) Assistive device: Rolling walker (2 wheels) Gait Pattern/deviations: Step-through pattern, Decreased step length - right, Decreased step length - left, Decreased stride length   Gait velocity interpretation: <1.31 ft/sec, indicative of household ambulator   General Gait Details: mildly unsteady, tentative, mild posterior bias.  Assist needed for stability and maneuvering the RW.  Stairs            Wheelchair Mobility    Modified Rankin (Stroke Patients Only)       Balance Overall balance assessment: Needs assistance Sitting-balance support: Feet supported, Single extremity supported Sitting balance-Leahy Scale: Fair     Standing balance support: During functional activity, Bilateral upper extremity supported Standing balance-Leahy Scale: Poor Standing balance comment: Still posterior bias.                             Pertinent Vitals/Pain Pain Assessment Pain Assessment: No/denies pain    Home Living Family/patient expects to be discharged to:: Private residence Living Arrangements: Spouse/significant other Available Help at Discharge: Family;Available 24  hours/day Type of Home: House Home Access: Stairs to enter Entrance Stairs-Rails: Left Entrance Stairs-Number of Steps: 3   Home Layout: One level Home  Equipment: Rollator (4 wheels);Shower seat      Prior Function Prior Level of Function : Independent/Modified Independent                     Hand Dominance   Dominant Hand: Right    Extremity/Trunk Assessment   Upper Extremity Assessment Upper Extremity Assessment: Generalized weakness    Lower Extremity Assessment Lower Extremity Assessment: Generalized weakness       Communication   Communication: Expressive difficulties  Cognition Arousal/Alertness: Awake/alert Behavior During Therapy: WFL for tasks assessed/performed Overall Cognitive Status: Within Functional Limits for tasks assessed                                 General Comments: Pt oriented X4. Requested questions to be repeated when needed due to baseline aphasia.        General Comments General comments (skin integrity, edema, etc.): vss, during activity    Exercises     Assessment/Plan    PT Assessment Patient needs continued PT services  PT Problem List Decreased strength;Decreased activity tolerance;Decreased balance;Decreased mobility;Decreased coordination;Cardiopulmonary status limiting activity       PT Treatment Interventions DME instruction;Gait training;Functional mobility training;Stair training;Therapeutic activities;Balance training;Patient/family education    PT Goals (Current goals can be found in the Care Plan section)  Acute Rehab PT Goals Patient Stated Goal: be able to get home PT Goal Formulation: With patient Time For Goal Achievement: 04/03/22 Potential to Achieve Goals: Good    Frequency Min 3X/week     Co-evaluation               AM-PAC PT "6 Clicks" Mobility  Outcome Measure Help needed turning from your back to your side while in a flat bed without using bedrails?: A Lot Help needed moving from lying on your back to sitting on the side of a flat bed without using bedrails?: A Lot Help needed moving to and from a bed to a chair (including  a wheelchair)?: A Lot Help needed standing up from a chair using your arms (e.g., wheelchair or bedside chair)?: A Lot Help needed to walk in hospital room?: A Little Help needed climbing 3-5 steps with a railing? : A Lot 6 Click Score: 13    End of Session       Nurse Communication: Mobility status PT Visit Diagnosis: Unsteadiness on feet (R26.81);Other abnormalities of gait and mobility (R26.89)    Time: 0630-1601 PT Time Calculation (min) (ACUTE ONLY): 22 min   Charges:   PT Evaluation $PT Eval Moderate Complexity: 1 Mod          03/20/2022  Ginger Carne., PT Acute Rehabilitation Services [phone]  (office)  Tessie Fass Debroah Shuttleworth 03/20/2022, 1:55 PM

## 2022-03-20 NOTE — Progress Notes (Signed)
Inpatient Rehab Admissions Coordinator Note:   Per OT recommendation patient was screened for CIR candidacy by Michel Santee, PT. At this time, pt appears to be a potential candidate for CIR. I will place an order for rehab consult for full assessment, per our protocol.  Please contact me any with questions.Shann Medal, PT, DPT [phone] 03/20/22 1:32 PM

## 2022-03-20 NOTE — Evaluation (Addendum)
Occupational Therapy Evaluation Patient Details Name: Mandy Durham MRN: [id number] DOB: 08-20-1945 Today's Date: 03/20/2022   History of Present Illness Mandy Durham is a 77 y.o. female with a history of atypical PD with dysarthria, HTN, HLD, SSS s/p PPM, autonomic instability, HFpEF, traumatic SAH with resulting TBI, and prediabetes who presented from home to ED 1/3 with weakness with poor appetite found to have covid-19 infection without pneumonia or respiratory symptoms/hypoxia.   Clinical Impression   Pt in bed upon therapy arrival and agreeable to participate in OT evaluation. Prior to admit, pt lived with husband and was Mod I with BADL tasks. Pt with baseline aphasia requesting questions to be repeated if needed for understanding.  Currently, pt demonstrates decreased strength, endurance, and activity tolerance requiring increased time and physical assistance to complete BADL tasks. Recommend CIR at discharge to focus on mentioned deficits prior to returning home with husband. Acute OT will continue to follow.      Recommendations for follow up therapy are one component of a multi-disciplinary discharge planning process, led by the attending physician.  Recommendations may be updated based on patient status, additional functional criteria and insurance authorization.   Follow Up Recommendations  Acute inpatient rehab (3hours/day)     Assistance Recommended at Discharge Intermittent Supervision/Assistance  Patient can return home with the following A little help with walking and/or transfers;A little help with bathing/dressing/bathroom;Assistance with cooking/housework;Assist for transportation;Help with stairs or ramp for entrance    Functional Status Assessment  Patient has had a recent decline in their functional status and demonstrates the ability to make significant improvements in function in a reasonable and predictable amount of time.  Equipment Recommendations  Other  (comment) (TBD)       Precautions / Restrictions Precautions Precautions: Fall Precaution Comments: Parkinson's, posterior bias, baseline aphasic Restrictions Weight Bearing Restrictions: No      Mobility Bed Mobility Overal bed mobility: Needs Assistance Bed Mobility: Supine to Sit     Supine to sit: Mod assist, HOB elevated       Patient Response: Cooperative, Flat affect  Transfers Overall transfer level: Needs assistance Equipment used: Rolling walker (2 wheels) Transfers: Sit to/from Stand, Bed to chair/wheelchair/BSC Sit to Stand: Mod assist, From elevated surface     Step pivot transfers: Mod assist     General transfer comment: VC and tactile cues provided for hand placement and technique/sequencing when using RW prior to sit to stand transition. Also provided steps for sequencing when transitioning from standing to sit. Provided VC for sequencing during RW management (walker forward, step step). Encouraged "big" movements when stepping which decreased posterior lean significantly.      Balance Overall balance assessment: Needs assistance Sitting-balance support: Feet unsupported, Single extremity supported Sitting balance-Leahy Scale: Fair     Standing balance support: During functional activity, Bilateral upper extremity supported Standing balance-Leahy Scale: Poor Standing balance comment: Posterior bias when standing which decreased with VC for appropriate adjustments to standing posture, task sequencing, and body awareness.       ADL either performed or assessed with clinical judgement   ADL Overall ADL's : Needs assistance/impaired     Grooming: Wash/dry hands;Wash/dry face;Sitting;Set up   Upper Body Bathing: Set up;Cueing for sequencing;Sitting   Lower Body Bathing: Maximal assistance;Sitting/lateral leans;Sit to/from stand   Upper Body Dressing : Minimal assistance;Sitting;Cueing for sequencing   Lower Body Dressing: Total  assistance;Sitting/lateral leans;Sit to/from stand   Toilet Transfer: Moderate assistance;Ambulation;BSC/3in1;Rolling walker (2 wheels)   Toileting- Clothing Manipulation and  Hygiene: Total assistance;Sit to/from stand;Cueing for safety               Vision Baseline Vision/History: 1 Wears glasses Ability to See in Adequate Light: 0 Adequate (with magnifying glass) Patient Visual Report: No change from baseline Vision Assessment?: No apparent visual deficits            Pertinent Vitals/Pain Pain Assessment Pain Assessment: No/denies pain     Hand Dominance Right   Extremity/Trunk Assessment Upper Extremity Assessment Upper Extremity Assessment: Generalized weakness   Lower Extremity Assessment Lower Extremity Assessment: Defer to PT evaluation       Communication Communication Communication: Other (comment)pt aphasic (baseline)   Cognition Arousal/Alertness: Awake/alert Behavior During Therapy: WFL for tasks assessed/performed Overall Cognitive Status: Within Functional Limits for tasks assessed             General Comments: Pt oriented X4. Requested questions to be repeated when needed due to baseline aphasia.     General Comments  VSS. BLEs very dry and scaly.            Home Living Family/patient expects to be discharged to:: Private residence Living Arrangements: Spouse/significant other Available Help at Discharge: Family;Available 24 hours/day Type of Home: House Home Access: Stairs to enter [employer] of Steps: 3 Entrance Stairs-Rails: Left Home Layout: One level     Bathroom Shower/Tub: Walk-in Hydrologist: Handicapped height Bathroom Accessibility: Yes How Accessible: Accessible via walker (it would be tight) Home Equipment: Rollator (4 wheels);Shower seat          Prior Functioning/Environment Prior Level of Function : Independent/Modified Independent              OT Problem List:  Decreased strength;Decreased knowledge of use of DME or AE;Decreased coordination;Decreased activity tolerance;Impaired balance (sitting and/or standing);Decreased safety awareness      OT Treatment/Interventions: Self-care/ADL training;Modalities;Balance training;Therapeutic exercise;Neuromuscular education;Therapeutic activities;Energy conservation;DME and/or AE instruction;Manual therapy;Patient/family education    OT Goals(Current goals can be found in the care plan section) Acute Rehab OT Goals Patient Stated Goal: to get stronger OT Goal Formulation: With patient Time For Goal Achievement: 04/03/22 Potential to Achieve Goals: Good  OT Frequency: Min 2X/week       AM-PAC OT "6 Clicks" Daily Activity     Outcome Measure Help from another person eating meals?: None Help from another person taking care of personal grooming?: A Little Help from another person toileting, which includes using toliet, bedpan, or urinal?: A Lot Help from another person bathing (including washing, rinsing, drying)?: A Lot Help from another person to put on and taking off regular upper body clothing?: A Little Help from another person to put on and taking off regular lower body clothing?: A Lot 6 Click Score: 16   End of Session Equipment Utilized During Treatment: Gait belt;Rolling walker (2 wheels) Nurse Communication: Mobility status  Activity Tolerance: Patient tolerated treatment well Patient left: in chair;with call bell/phone within reach;with chair alarm set  OT Visit Diagnosis: Muscle weakness (generalized) (M62.81);Unsteadiness on feet (R26.81)                Time: 7846-9629 OT Time Calculation (min): 74 min Charges:  OT General Charges $OT Visit: 1 Visit OT Evaluation $OT Eval High Complexity: 1 High  OT Treatments  $Self Care/Home Management  38-52 mins  $Therapeutic Activity 8-22 mins   Ailene Ravel, OTR/L,CBIS  Supplemental OT - [facility] and [facility] Secure Chat Preferred     Chanel Mckesson, Clarene Duke 03/20/2022, 10:39  AM

## 2022-03-20 NOTE — Progress Notes (Signed)
PROGRESS NOTE  Brief Narrative: Mandy Durham is a 77 y.o. female with a history of atypical PD with dysarthria, HTN, HLD, SSS s/p PPM, autonomic instability, HFpEF, traumatic SAH with resulting TBI, and prediabetes who presented from home to ED 1/3 with weakness with poor appetite found to have covid-19 infection without pneumonia or respiratory symptoms/hypoxia. Paxlovid was started, IVF given and the patient was admitted.  Subjective: Fever last night, some delirium worse towards that time, but mentally clear at this time. She reports awareness of that disorientation.   Objective: BP (!) 96/57 (BP Location: Left Arm)   Pulse 80   Temp 98 F (36.7 C) (Oral)   Resp 18   Ht '5\' 5"'$  (1.651 m)   Wt 47.1 kg   SpO2 99%   BMI 17.28 kg/m   Gen: Pleasant elderly female in no distress Pulm: Clear nonlabored  CV: RRR no MRG or edema GI: Soft, NT, ND, +BS  Neuro: Alert and oriented. No new focal deficits. Ext: Warm, no deformities Skin: No rashes, lesions or ulcers on visualized skin   Assessment & Plan: Covid-19 infection: At high risk for progression, though not currently with evidence of pneumonia/respiratory distress. CRP and d-dimer modestly elevated with normal fibrinogen, lymphopenia noted. LFTs wnl. No hypoxia, tachycardia, respiratory complaints or pleuritic chest pain, so doubt PE. PCT undetectable.  - Tylenol prn fever - Paxlovid x5 days (1/4 - 1/8) - Ppx lovenox - Airborne isolation - Incentive spirometry  Generalized weakness, failure to thrive, moderate protein calorie malnutrition. BMI is 17, looks underweight as well, reports several months of dyspnea on exertion. Ketonuria consistent with fasting ketosis. - RD consult appreciated, supplement protein - PT/OT > pt w/decreased strength and endurance, would most benefit from SNF level rehabilitation or CIR (though doesn't have adequate 24/7 support at home) - Modify IVF  PD:  - Continue home medications for movement  disorder, including sinemet, not on amantadine.   Depression: quiescent.  - Continue SSRI  Hypokalemia: Supplement and monitor  HFpEF: Euvolemic, slightly hypovolemic. Has not been taking lasix.  - Monitor volume status closely with IVF.  SSS s/p PPM  HTN:  - Continue losartan  HLD:  - Continue zetia  Hematuria: Recheck at follow up.  Patrecia Pour, MD Pager on [facility] 03/20/2022, 6:29 PM

## 2022-03-21 DIAGNOSIS — U071 COVID-19: Secondary | ICD-10-CM | POA: Diagnosis not present

## 2022-03-21 LAB — BASIC METABOLIC PANEL
Anion gap: 8 (ref 5–15)
BUN: 22 mg/dL (ref 8–23)
CO2: 21 mmol/L — ABNORMAL LOW (ref 22–32)
Calcium: 8 mg/dL — ABNORMAL LOW (ref 8.9–10.3)
Chloride: 107 mmol/L (ref 98–111)
Creatinine, Ser: 0.82 mg/dL (ref 0.44–1.00)
GFR, Estimated: >60 mL/min (ref >60)
Glucose, Bld: 94 mg/dL (ref 70–99)
Potassium: 4.1 mmol/L (ref 3.5–5.1)
Sodium: 136 mmol/L (ref 135–145)

## 2022-03-21 LAB — CBC
HCT: 39.2 % (ref 36.0–46.0)
Hemoglobin: 13.3 g/dL (ref 12.0–15.0)
MCH: 28.4 pg (ref 26.0–34.0)
MCHC: 33.9 g/dL (ref 30.0–36.0)
MCV: 83.6 fL (ref 80.0–100.0)
Platelets: 224 10*3/uL (ref 150–400)
RBC: 4.69 MIL/uL (ref 3.87–5.11)
RDW: 13.9 % (ref 11.5–15.5)
WBC: 6.7 10*3/uL (ref 4.0–10.5)
nRBC: 0 % (ref 0.0–0.2)

## 2022-03-21 LAB — PHOSPHORUS: Phosphorus: 3.2 mg/dL (ref 2.5–4.6)

## 2022-03-21 LAB — MAGNESIUM: Magnesium: 2 mg/dL (ref 1.7–2.4)

## 2022-03-21 MED ORDER — DEXTROSE IN LACTATED RINGERS 5 % IV SOLN: INTRAVENOUS | Status: DC

## 2022-03-21 NOTE — Plan of Care (Signed)

## 2022-03-21 NOTE — Progress Notes (Signed)
PROGRESS NOTE  Brief Narrative: Mandy Durham is a 77 y.o. female with a history of atypical PD with dysarthria, HTN, HLD, SSS s/p PPM, autonomic instability, HFpEF, traumatic SAH with resulting TBI, and prediabetes who presented from home to ED 1/3 with weakness with poor appetite found to have covid-19 infection without pneumonia or respiratory symptoms/hypoxia. Paxlovid was started, IVF given and the patient was admitted.  Subjective: Mandy Durham this morning, having no complaints. Later this afternoon RN reports minimal oral intake.  Objective: BP (!) 157/87 (BP Location: Left Arm)   Pulse 79   Temp 98.1 F (36.7 C) (Oral)   Resp 19   Ht '5\' 5"'$  (1.651 m)   Wt 47.1 kg   SpO2 99%   BMI 17.28 kg/m   Gen: No distress, elderly Pulm: Clear, nonlabored  CV: RRR, no edema or JVD. GI: Soft, NT, ND, +BS  Neuro: Alert and oriented, stable stuttering speech associated with some tremor during stuttering episodes that improves otherwise. No new focal deficits. Ext: Warm, no deformities Skin: No rashes, lesions or ulcers on visualized skin   Assessment & Plan: Covid-19 infection: At high risk for progression, though not currently with evidence of pneumonia/respiratory distress. CRP and d-dimer modestly elevated with normal fibrinogen, lymphopenia noted. LFTs wnl. No hypoxia, tachycardia, respiratory complaints or pleuritic chest pain, so doubt PE. PCT undetectable.  - Tylenol prn fever - Paxlovid x5 days (1/4 - 1/8) - Ppx lovenox - Airborne isolation - Incentive spirometry  Generalized weakness, failure to thrive, moderate protein calorie malnutrition. BMI is 17, looks underweight as well, reports several months of dyspnea on exertion. Ketonuria consistent with fasting ketosis. - RD consult appreciated, supplement protein - PT/OT > pt w/decreased strength and endurance, would most benefit from SNF level rehabilitation. TOC aware. - Labs improved with IVF though she's not taking adequate enteral  nutrition/hydration. Will continue IVF with dextrose to prevent/reduce catabolism.   PD:  - Continue home medications for movement disorder, including sinemet, not on amantadine.   Depression: quiescent.  - Continue SSRI  Hypokalemia: Supplement and monitor  Hyponatremia: Resolved.  HFpEF: Euvolemic, slightly hypovolemic. Has not been taking lasix.  - Was planning to stop IVF but oral intake is minimal. Not overloaded.   SSS s/p PPM  HTN:  - Continue losartan  HLD:  - Continue zetia  Hematuria: Recheck at follow up.  Mandy Pour, MD Pager on amion 03/21/2022, 2:25 PM

## 2022-03-22 DIAGNOSIS — U071 COVID-19: Secondary | ICD-10-CM | POA: Diagnosis not present

## 2022-03-22 LAB — BASIC METABOLIC PANEL
Anion gap: 10 (ref 5–15)
BUN: 11 mg/dL (ref 8–23)
CO2: 22 mmol/L (ref 22–32)
Calcium: 8.3 mg/dL — ABNORMAL LOW (ref 8.9–10.3)
Chloride: 102 mmol/L (ref 98–111)
Creatinine, Ser: 0.66 mg/dL (ref 0.44–1.00)
GFR, Estimated: >60 mL/min (ref >60)
Glucose, Bld: 128 mg/dL — ABNORMAL HIGH (ref 70–99)
Potassium: 3.8 mmol/L (ref 3.5–5.1)
Sodium: 134 mmol/L — ABNORMAL LOW (ref 135–145)

## 2022-03-22 LAB — PHOSPHORUS: Phosphorus: 2.8 mg/dL (ref 2.5–4.6)

## 2022-03-22 LAB — MAGNESIUM: Magnesium: 1.8 mg/dL (ref 1.7–2.4)

## 2022-03-22 NOTE — Progress Notes (Signed)
PROGRESS NOTE  Brief Narrative: Mehar S Sandridge is a 77 y.o. female with a history of atypical PD with dysarthria, HTN, HLD, SSS s/p PPM, autonomic instability, HFpEF, traumatic SAH with resulting TBI, and prediabetes who presented from home to ED 1/3 with weakness with poor appetite found to have covid-19 infection without pneumonia or respiratory symptoms/hypoxia. Paxlovid was started, IVF given and the patient was admitted.  Subjective: Aware of some delirium that returned overnight, thought she had moved to a different hospital.   Objective: BP (!) 150/85 (BP Location: Left Arm)   Pulse 85   Temp 98.1 F (36.7 C) (Oral)   Resp 18   Ht '5\' 5"'$  (1.651 m)   Wt 47.1 kg   SpO2 97%   BMI 17.28 kg/m   Gen: Elderly female in no distress Pulm: Clear and nonlabored  CV: RRR, no MRG or edema GI: Soft, NT, ND, +BS Neuro: Alert, conversant, stable speech. No new focal deficits. Ext: Warm, no deformities Skin: No rashes, lesions or ulcers on visualized skin   Assessment & Plan: Covid-19 infection: At high risk for progression, though not currently with evidence of pneumonia/respiratory distress. CRP and d-dimer modestly elevated with normal fibrinogen, lymphopenia noted. LFTs wnl. No hypoxia, tachycardia, respiratory complaints or pleuritic chest pain, so doubt PE. PCT undetectable.  - Tylenol prn fever - Paxlovid x5 days (1/4 - 1/8) - Ppx lovenox - Airborne isolation - Incentive spirometry  Generalized weakness, failure to thrive, moderate protein calorie malnutrition. BMI is 17, looks underweight as well, reports several months of dyspnea on exertion. Ketonuria consistent with fasting ketosis. - RD consult appreciated, supplement protein - PT/OT > pt w/decreased strength and endurance, would most benefit from SNF level rehabilitation. TOC aware. - Labs improved with IVF though she's not taking adequate enteral nutrition/hydration. Will continue IVF with dextrose to prevent/reduce  catabolism.   PD:  - Continue home medications for movement disorder, including sinemet, not on amantadine.   Depression: quiescent.  - Continue SSRI  Hypokalemia: Supplemented with resolution  Hyponatremia: Improved  HFpEF: Euvolemic, slightly hypovolemic. Has not been taking lasix.  - Continue IVF. Not overloaded.   SSS s/p PPM  HTN:  - Continue losartan  HLD:  - Continue zetia  Hematuria: Recheck at follow up.  Patrecia Pour, MD Pager on amion 03/22/2022, 2:07 PM

## 2022-03-22 NOTE — Plan of Care (Signed)

## 2022-03-23 DIAGNOSIS — U071 COVID-19: Secondary | ICD-10-CM | POA: Diagnosis not present

## 2022-03-23 LAB — BASIC METABOLIC PANEL
Anion gap: 8 (ref 5–15)
BUN: 7 mg/dL — ABNORMAL LOW (ref 8–23)
CO2: 29 mmol/L (ref 22–32)
Calcium: 8.6 mg/dL — ABNORMAL LOW (ref 8.9–10.3)
Chloride: 98 mmol/L (ref 98–111)
Creatinine, Ser: 0.66 mg/dL (ref 0.44–1.00)
GFR, Estimated: >60 mL/min (ref >60)
Glucose, Bld: 117 mg/dL — ABNORMAL HIGH (ref 70–99)
Potassium: 3.3 mmol/L — ABNORMAL LOW (ref 3.5–5.1)
Sodium: 135 mmol/L (ref 135–145)

## 2022-03-23 LAB — PHOSPHORUS: Phosphorus: 3.1 mg/dL (ref 2.5–4.6)

## 2022-03-23 LAB — MAGNESIUM: Magnesium: 1.9 mg/dL (ref 1.7–2.4)

## 2022-03-23 MED ORDER — POTASSIUM CHLORIDE CRYS ER 20 MEQ PO TBCR
20.0000 meq | EXTENDED_RELEASE_TABLET | Freq: Once | ORAL | Status: AC
  Administered 2022-03-23: 20 meq via ORAL
  Filled 2022-03-23: qty 1

## 2022-03-23 MED ORDER — KCL-LACTATED RINGERS-D5W 20 MEQ/L IV SOLN
INTRAVENOUS | Status: DC
  Filled 2022-03-23 (×3): qty 1000

## 2022-03-23 NOTE — NC FL2 (Signed)
[place] LEVEL OF CARE FORM     IDENTIFICATION  Patient Name: Mandy Durham Birthdate: [birth year] Sex: female Admission Date (Current Location): 03/18/2022  [facility] and [place] Number:  Herbalist and Address:  [facility], [place] [address] [place], [place] [address]      Provider Number: [id number]  Attending Physician Name and Address:  Patrecia Pour, MD  Relative Name and Phone Number:  Jones,Christy Daughter   [phone]    Current Level of Care: Hospital Recommended Level of Care: [place] Prior Approval Number:    Date Approved/Denied:   PASRR Number: [id number]  Discharge Plan: SNF    Current Diagnoses: Patient Active Problem List   Diagnosis Date Noted   Malnutrition of moderate degree 03/20/2022   COVID-19 03/19/2022   Fluency disorder associated with underlying disease 12/18/2021   Atypical parkinsonism 12/18/2021   Diplopia 12/18/2021   Cervical dystonia 12/18/2021   Dyskinesia 12/18/2021   Neuropathy involving both lower extremities 06/09/2021   Complaints of weakness of lower extremity 06/09/2021   Abnormal brain MRI 06/09/2021   Neck muscle weakness 56/31/4970   Diastolic CHF ([place]) 26/37/8588   Kyphosis due to degeneration of spine 05/05/2021   Drug-induced orofacial dyskinesia 05/05/2021   Hyperkinesis 05/05/2021   Traumatic intracerebral hemorrhage with loss of consciousness of 31 minutes to 59 minutes ([place]) 05/05/2021   Stammering and stuttering 05/05/2021   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[place]) 09/07/2018   Functional dyspepsia 09/08/2017   Slow transit constipation 09/08/2017   S/P placement of cardiac pacemaker    Mobitz II 02/20/2016   Nystagmus, end-position 02/12/2016   Dizziness and giddiness 02/12/2016   Flapping tremor 02/12/2016   Chronic fatigue 10/28/2015   PCP NOTES >>>> 02/13/2015   TBI (traumatic brain injury) ([place]) 01/28/2015   Essential hypertension    Acute  post-traumatic headache, not intractable    Vertigo due to brain injury ([place])    [place] (subarachnoid hemorrhage) ([place]) 01/21/2015   Annual physical exam 09/07/2014   Orthostatic hypotension 08/30/2014   Acromioclavicular arthrosis 03/21/2014   Allergic rhinitis 02/27/2014   SI (sacroiliac) joint dysfunction 02/02/2014   Gastrocnemius strain, left 12/11/2013   Back pain 08/22/2013   LBBB (left bundle branch block) 05/13/2013   Borderline diabetes    Insomnia 01/12/2008   HLD (hyperlipidemia) 02/04/2007   Anxiety 10/12/2006   Osteoarthritis 10/12/2006    Orientation RESPIRATION BLADDER Height & Weight     Self, Time, Situation, Place  Normal Incontinent, External catheter Weight: 103 lb 13.4 oz (47.1 kg) Height:  '5\' 5"'$  (165.1 cm)  BEHAVIORAL SYMPTOMS/MOOD NEUROLOGICAL BOWEL NUTRITION STATUS      Incontinent Diet (see discharge summary)  AMBULATORY STATUS COMMUNICATION OF NEEDS Skin   Limited Assist Verbally Normal                       Personal Care Assistance Level of Assistance  Bathing, Feeding, Dressing, Total care Bathing Assistance: Maximum assistance Feeding assistance: Limited assistance Dressing Assistance: Maximum assistance Total Care Assistance: Maximum assistance   Functional Limitations Info  Sight, Hearing, Speech Sight Info: Adequate Hearing Info: Adequate Speech Info: Adequate    SPECIAL CARE FACTORS FREQUENCY  PT (By licensed PT), OT (By licensed OT)     PT Frequency: 5x week OT Frequency: 5x week            Contractures Contractures Info: Not present    Additional Factors Info  Code Status, Allergies Code Status  Info: full Allergies Info: NKA           Current Medications (03/23/2022):  This is the current hospital active medication list Current Facility-Administered Medications  Medication Dose Route Frequency Provider Last Rate Last Admin   acetaminophen (TYLENOL) tablet 650 mg  650 mg Oral Q6H PRN Clance Boll, MD   650 mg  at 03/23/22 0815   Or   acetaminophen (TYLENOL) suppository 650 mg  650 mg Rectal Q6H PRN Clance Boll, MD       albuterol (PROVENTIL) (2.5 MG/3ML) 0.083% nebulizer solution 2.5 mg  2.5 mg Nebulization Q2H PRN Clance Boll, MD       aspirin EC tablet 81 mg  81 mg Oral Daily Myles Rosenthal A, MD   81 mg at 03/23/22 0815   baclofen (LIORESAL) tablet 10 mg  10 mg Oral BID PRN Clance Boll, MD   10 mg at 03/20/22 2112   carbidopa-levodopa (SINEMET IR) 25-100 MG per tablet immediate release 1 tablet  1 tablet Oral QID Patrecia Pour, MD   1 tablet at 03/23/22 0815   dextrose 5 % in lactated ringers infusion   Intravenous Continuous Patrecia Pour, MD   Stopped at 03/23/22 0818   dextrose 5% in lactated ringers with KCl 20 mEq/L infusion   Intravenous Continuous Patrecia Pour, MD 75 mL/hr at 03/23/22 0821 New Bag at 03/23/22 0821   enoxaparin (LOVENOX) injection 30 mg  30 mg Subcutaneous Q24H Myles Rosenthal A, MD   30 mg at 03/22/22 1703   escitalopram (LEXAPRO) tablet 10 mg  10 mg Oral Daily Patrecia Pour, MD   10 mg at 03/23/22 0815   ezetimibe (ZETIA) tablet 10 mg  10 mg Oral Daily Myles Rosenthal A, MD   10 mg at 03/23/22 0815   lactose free nutrition (BOOST PLUS) liquid 237 mL  237 mL Oral BID BM Patrecia Pour, MD   237 mL at 03/23/22 0816   losartan (COZAAR) tablet 100 mg  100 mg Oral Daily Myles Rosenthal A, MD   100 mg at 03/23/22 0815   multivitamin with minerals tablet 1 tablet  1 tablet Oral Daily Patrecia Pour, MD   1 tablet at 03/23/22 0815   nirmatrelvir/ritonavir (PAXLOVID) 3 tablet  3 tablet Oral BID Clance Boll, MD   3 tablet at 03/23/22 0816   ondansetron (ZOFRAN) tablet 4 mg  4 mg Oral Q6H PRN Clance Boll, MD       Or   ondansetron [facility]) injection 4 mg  4 mg Intravenous Q6H PRN Clance Boll, MD         Discharge Medications: Please see discharge summary for a list of discharge medications.  Relevant Imaging  Results:  Relevant Lab Results:   Additional Information SSN [id number]  Joanne Chars, LCSW

## 2022-03-23 NOTE — TOC Initial Note (Signed)
Transition of Care [facility]) - Initial/Assessment Note    Patient Details  Name: Mandy Durham MRN: [id number] Date of Birth: December 18, 1945  Transition of [facility] CM/SW Contact:    Joanne Chars, LCSW Phone Number: 03/23/2022, 1:05 PM  Clinical Narrative:  CSW spoke briefly with pt regarding DC plan, she asked CSW to speak with her husband and daughter.  CSW left message with both, received call back from daughter [name], who confirms that they do want SNF, no facility preference.  Pt covid test positive on 03/18/22, discussed that most SNF require 10 days post positive test, may have fewer choices and daughter verbalizes understanding.  Pt lives at home with husband Mandy Durham, no current services.  Pt is vaccinated for covid with one booster.     Referral sent out in hub for SNF               Expected Discharge Plan: [place] Barriers to Discharge: SNF Pending bed offer   Patient Goals and CMS Choice            Expected Discharge Plan and Services In-house Referral: Clinical Social Work   Post Acute Care Choice: [place] Living arrangements for the past 2 months: [place] Expected Discharge Date: 03/23/22                                    Prior Living Arrangements/Services Living arrangements for the past 2 months: Single Family Home Lives with:: Spouse Patient language and need for interpreter reviewed:: Yes        Need for Family Participation in Patient Care: Yes (Comment) Care giver support system in place?: Yes (comment) Current home services: Other (comment) (none) Criminal Activity/Legal Involvement Pertinent to Current Situation/Hospitalization: No - Comment as needed  Activities of Daily Living Home Assistive Devices/Equipment: Walker (specify type) ADL Screening (condition at time of admission) Patient's cognitive ability adequate to safely complete daily activities?: Yes Is the patient deaf or have difficulty  hearing?: No Does the patient have difficulty seeing, even when wearing glasses/contacts?: Yes Does the patient have difficulty concentrating, remembering, or making decisions?: Yes Patient able to express need for assistance with ADLs?: Yes Does the patient have difficulty dressing or bathing?: Yes Independently performs ADLs?: Yes (appropriate for developmental age) Does the patient have difficulty walking or climbing stairs?: Yes Weakness of Legs: Both Weakness of Arms/Hands: Both  Permission Sought/Granted                  Emotional Assessment Appearance:: Appears stated age Attitude/Demeanor/Rapport: Engaged Affect (typically observed): Appropriate, Pleasant Orientation: : Oriented to Self, Oriented to Place, Oriented to  Time, Oriented to Situation      Admission diagnosis:  Generalized weakness [R53.1] COVID-19 [U07.1] Patient Active Problem List   Diagnosis Date Noted   Malnutrition of moderate degree 03/20/2022   COVID-19 03/19/2022   Fluency disorder associated with underlying disease 12/18/2021   Atypical parkinsonism 12/18/2021   Diplopia 12/18/2021   Cervical dystonia 12/18/2021   Dyskinesia 12/18/2021   Neuropathy involving both lower extremities 06/09/2021   Complaints of weakness of lower extremity 06/09/2021   Abnormal brain MRI 06/09/2021   Neck muscle weakness 95/63/8756   Diastolic CHF ([place]) 43/32/9518   Kyphosis due to degeneration of spine 05/05/2021   Drug-induced orofacial dyskinesia 05/05/2021   Hyperkinesis 05/05/2021   Traumatic intracerebral hemorrhage with loss of consciousness of 31 minutes  to 59 minutes ([place]) 05/05/2021   Stammering and stuttering 05/05/2021   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[place]) 09/07/2018   Functional dyspepsia 09/08/2017   Slow transit constipation 09/08/2017   S/P placement of cardiac pacemaker    Mobitz II 02/20/2016   Nystagmus, end-position 02/12/2016   Dizziness and giddiness 02/12/2016   Flapping tremor 02/12/2016    Chronic fatigue 10/28/2015   PCP NOTES >>>> 02/13/2015   TBI (traumatic brain injury) ([place]) 01/28/2015   Essential hypertension    Acute post-traumatic headache, not intractable    Vertigo due to brain injury ([place])    [place] (subarachnoid hemorrhage) ([place]) 01/21/2015   Annual physical exam 09/07/2014   Orthostatic hypotension 08/30/2014   Acromioclavicular arthrosis 03/21/2014   Allergic rhinitis 02/27/2014   SI (sacroiliac) joint dysfunction 02/02/2014   Gastrocnemius strain, left 12/11/2013   Back pain 08/22/2013   LBBB (left bundle branch block) 05/13/2013   Borderline diabetes    Insomnia 01/12/2008   HLD (hyperlipidemia) 02/04/2007   Anxiety 10/12/2006   Osteoarthritis 10/12/2006   PCP:  Colon Branch, MD Pharmacy:   [facility] [place] [address] [place] ([place]) [place] [address] Phone: [phone] Fax: [phone]     Social Determinants of Health (SDOH) Social History: SDOH Screenings   Food Insecurity: No Food Insecurity (03/19/2022)  Recent Concern: Food Insecurity - Food Insecurity Present (02/13/2022)  Housing: Low Risk  (03/19/2022)  Transportation Needs: No Transportation Needs (03/19/2022)  Utilities: Not At Risk (03/19/2022)  Alcohol Screen: Low Risk  (04/30/2021)  Depression (PHQ2-9): Medium Risk (01/28/2022)  Financial Resource Strain: Low Risk  (04/30/2021)  Physical Activity: Inactive (04/30/2021)  Social Connections: Moderately Integrated (04/30/2021)  Stress: Stress Concern Present (04/30/2021)  Tobacco Use: Low Risk  (03/18/2022)   SDOH Interventions:     Readmission Risk Interventions     No data to display

## 2022-03-23 NOTE — Progress Notes (Signed)
Occupational Therapy Treatment Patient Details Name: Mandy Durham MRN: [id number] DOB: Jun 07, 1945 Today's Date: 03/23/2022   History of present illness Mandy Durham is a 77 y.o. female with a history of atypical PD with dysarthria, HTN, HLD, SSS s/p PPM, autonomic instability, HFpEF, traumatic SAH with resulting TBI, and prediabetes who presented from home to ED 1/3 with weakness with poor appetite found to have covid-19 infection without pneumonia or respiratory symptoms/hypoxia.   OT comments  Pt states she was not seen over the weekend and feels very weak. Pt's family has not said that husband will not be able to provide all the care needed and that both of them probably need more care and they are in search of an ALF which seems very reasonable at this point.  Pt would benefit from SNF rehab as pt does not have the assist needed at home and is currently at a mod assist level of care.  Will continue to see to promote mobility and increased balance in standing during adls.     Recommendations for follow up therapy are one component of a multi-disciplinary discharge planning process, led by the attending physician.  Recommendations may be updated based on patient status, additional functional criteria and insurance authorization.    Follow Up Recommendations  Skilled nursing-short term rehab (<3 hours/day)     Assistance Recommended at Discharge Frequent or constant Supervision/Assistance  Patient can return home with the following  A lot of help with walking and/or transfers;A little help with bathing/dressing/bathroom;Assistance with cooking/housework;Assist for transportation;Help with stairs or ramp for entrance   Equipment Recommendations  Other (comment) (tbd)    Recommendations for Other Services      Precautions / Restrictions Precautions Precautions: Fall Precaution Comments: Parkinson's, posterior bias, baseline aphasic Restrictions Weight Bearing Restrictions: No        Mobility Bed Mobility Overal bed mobility: Needs Assistance Bed Mobility: Supine to Sit     Supine to sit: Mod assist, HOB elevated     General bed mobility comments: Pt did well scooting to EOB but required assist to get into full sitting.    Transfers Overall transfer level: Needs assistance Equipment used: Rolling walker (2 wheels) Transfers: Sit to/from Stand, Bed to chair/wheelchair/BSC Sit to Stand: Mod assist     Step pivot transfers: Mod assist     General transfer comment: cues for hand placement, assis forward and with boost, initially mod with progression to minimal assist.     Balance Overall balance assessment: Needs assistance Sitting-balance support: Feet supported, Single extremity supported Sitting balance-Leahy Scale: Fair     Standing balance support: During functional activity, Bilateral upper extremity supported Standing balance-Leahy Scale: Poor Standing balance comment: Still posterior bias.                           ADL either performed or assessed with clinical judgement   ADL Overall ADL's : Needs assistance/impaired     Grooming: Wash/dry hands;Wash/dry face;Oral care;Set up;Sitting Grooming Details (indicate cue type and reason): attempted it standing. Pt with fatigue and posterior lean which made grooming in standing difficult. Returned to sitting.                 Toilet Transfer: Minimal Producer, television/film/video Details (indicate cue type and reason): Pt transferred to [facility] with walker and min assist. Pt with heavy posterior lean but pt feels as if she is falling foward when standing in her center of gravity.  Toileting- Clothing Manipulation and Hygiene: Total assistance;Sit to/from stand;Cueing for safety Toileting - Clothing Manipulation Details (indicate cue type and reason): Pt stood at walker with min assist while therapist cleaned pt.     Functional mobility during ADLs: Moderate  assistance;Rolling walker (2 wheels) General ADL Comments: Pt limited by mild cognitive deficits and posterior lean when standing. Pt very unsteady on feet with fear of falling.    Extremity/Trunk Assessment Upper Extremity Assessment Upper Extremity Assessment: Overall WFL for tasks assessed   Lower Extremity Assessment Lower Extremity Assessment: Defer to PT evaluation        Vision   Vision Assessment?: No apparent visual deficits   Perception Perception Perception: Not tested   Praxis Praxis Praxis: Impaired Praxis Impairment Details: Initiation    Cognition Arousal/Alertness: Awake/alert Behavior During Therapy: WFL for tasks assessed/performed Overall Cognitive Status: Impaired/Different from baseline Area of Impairment: Orientation, Problem solving                 Orientation Level: Disoriented to, Place           Problem Solving: Slow processing, Difficulty sequencing, Requires verbal cues, Requires tactile cues General Comments: Pt oriented to where she was but asked to be "taken outside onto the veranda." Explained to pt that she was in hospital with covid and it was cold outside and not the best option. Pt did say she knew she was in the hosptial with covid.        Exercises      Shoulder Instructions       General Comments Pt most limited by balance problems. Pt with strong posterior lean during all mobility.  Pt's weight is fully on heels with toes off the ground and sometimes front walker wheels of ground and pt still feels as if she is falling foward.    Pertinent Vitals/ Pain       Pain Assessment Pain Assessment: No/denies pain  Home Living                                          Prior Functioning/Environment              Frequency  Min 2X/week        Progress Toward Goals  OT Goals(current goals can now be found in the care plan section)  Progress towards OT goals: Progressing toward goals  Acute  Rehab OT Goals Patient Stated Goal: to get better OT Goal Formulation: With patient Time For Goal Achievement: 04/03/22 Potential to Achieve Goals: Good ADL Goals Pt Will Perform Upper Body Bathing: with supervision;sitting Pt Will Perform Lower Body Bathing: with min assist;sit to/from stand;sitting/lateral leans Pt Will Perform Upper Body Dressing: with supervision;sitting Pt Will Perform Lower Body Dressing: with min assist;sit to/from stand;sitting/lateral leans Pt Will Transfer to Toilet: with mod assist;ambulating;regular height toilet;bedside commode Pt Will Perform Toileting - Clothing Manipulation and hygiene: with mod assist;sit to/from stand;sitting/lateral leans Additional ADL Goal #1: Pt will increase standing balance requiring only Min guard assist using RW as needed while completing self care task.  Plan Discharge plan needs to be updated    Co-evaluation                 AM-PAC OT "6 Clicks" Daily Activity     Outcome Measure   Help from another person eating meals?: None Help from another person taking care of personal  grooming?: A Little Help from another person toileting, which includes using toliet, bedpan, or urinal?: A Lot Help from another person bathing (including washing, rinsing, drying)?: A Lot Help from another person to put on and taking off regular upper body clothing?: A Little Help from another person to put on and taking off regular lower body clothing?: A Lot 6 Click Score: 16    End of Session Equipment Utilized During Treatment: Gait belt;Rolling walker (2 wheels)  OT Visit Diagnosis: Muscle weakness (generalized) (M62.81);Unsteadiness on feet (R26.81)   Activity Tolerance Patient tolerated treatment well   Patient Left in chair;with call bell/phone within reach;with chair alarm set   Nurse Communication Mobility status        Time: 1007-1030 OT Time Calculation (min): 23 min  Charges: OT General Charges $OT Visit: 1 Visit OT  Treatments $Self Care/Home Management : 23-37 mins   Glenford Peers 03/23/2022, 10:41 AM

## 2022-03-23 NOTE — Progress Notes (Signed)
PROGRESS NOTE  Brief Narrative: Mandy Durham is a 77 y.o. female with a history of atypical PD with dysarthria, HTN, HLD, SSS s/p PPM, autonomic instability, HFpEF, traumatic SAH with resulting TBI, and prediabetes who presented from home to ED 1/3 with weakness with poor appetite found to have covid-19 infection without pneumonia or respiratory symptoms/hypoxia. Paxlovid was started, IVF given and the patient was admitted.  Subjective: Nervous about leaving for SNF. Denies pain or any specific complaints. Confirms she feels weak diffusely and is trying to eat but the food doesn't taste good.  Objective: BP (!) 163/93 (BP Location: Right Arm)   Pulse 83   Temp 97.6 F (36.4 C) (Oral)   Resp 18   Ht '5\' 5"'$  (1.651 m)   Wt 47.1 kg   SpO2 100%   BMI 17.28 kg/m   Gen: Elderly female in no distress Pulm: Nonlabored and clear  CV: RRR, no edema GI: Soft, NT, ND, +BS  Neuro: Alert and oriented once redirected but having some delirium. No new focal deficits. Ext: Warm, no deformities Skin: No rashes, lesions or ulcers on visualized skin   Assessment & Plan: Covid-19 infection: At high risk for progression, though not currently with evidence of pneumonia/respiratory distress. CRP and d-dimer modestly elevated with normal fibrinogen, lymphopenia noted. LFTs wnl. No hypoxia, tachycardia, respiratory complaints or pleuritic chest pain, so doubt PE. PCT undetectable.  - Tylenol prn fever - Complete paxlovid today (1/4 - 1/8) - Ppx lovenox - Airborne isolation x10 days per protocol (positive test 1/3) - Incentive spirometry  Generalized weakness, failure to thrive, moderate protein calorie malnutrition. BMI is 17, looks underweight as well, reports several months of dyspnea on exertion. Ketonuria consistent with fasting ketosis. - RD consult appreciated, supplement protein - PT/OT > pt w/decreased strength and endurance, would most benefit from SNF level rehabilitation. TOC working on this, may  be complicated by covid-positive status. - Labs improved with IVF though she's not taking adequate enteral nutrition/hydration. Will continue IVF with dextrose to prevent/reduce catabolism.   Hypokalemia: Add K po and IV.  PD:  - Continue home medications for movement disorder, including sinemet, not on amantadine.   Depression: quiescent.  - Continue SSRI  Hypokalemia: Supplemented with resolution  Hyponatremia: Improved  HFpEF: Euvolemic, slightly hypovolemic. Has not been taking lasix.  - Continue IVF. Not overloaded.   SSS s/p PPM  HTN:  - Continue losartan  HLD:  - Continue zetia  Hematuria: Recheck at follow up.  Patrecia Pour, MD Pager on amion 03/23/2022, 2:32 PM

## 2022-03-23 NOTE — Care Management Important Message (Signed)
Important Message  Patient Details  Name: Mandy Durham MRN: [id number] Date of Birth: 12-07-1945   Medicare Important Message Given:  Yes   Patient left prior to IM delivery will mail copy to the patients home address.    Kevin Space 03/23/2022, 3:56 PM

## 2022-03-23 NOTE — Progress Notes (Signed)
Physical Therapy Treatment Patient Details Name: Mandy Durham MRN: [id number] DOB: 09/17/1945 Today's Date: 03/23/2022   History of Present Illness Mandy Durham is a 77 y.o. female with a history of atypical PD with dysarthria, HTN, HLD, SSS s/p PPM, autonomic instability, HFpEF, traumatic SAH with resulting TBI, and prediabetes who presented from home to ED 1/3 with weakness with poor appetite found to have covid-19 infection without pneumonia or respiratory symptoms/hypoxia.    PT Comments    Pt is progressing towards goals. Pt gait improves with verbal cues for sequencing throughout. Pt was able to increase gait distance this session continuing to require Min A. Pt initially has heavy posterior bias when standing which improves with time in standing. Due to pt current functional status, prior level of function, home set up and available assistance at home recommending skilled physical therapy services in AIR setting on discharge from acute care hospital setting in order to decrease risk for falls, injury and re-hospitalization. Pt fatigued with gait and was slightly short of breathe which improved after resting in chair for ~1 min.    Recommendations for follow up therapy are one component of a multi-disciplinary discharge planning process, led by the attending physician.  Recommendations may be updated based on patient status, additional functional criteria and insurance authorization.  Follow Up Recommendations  Acute inpatient rehab (3hours/day)     Assistance Recommended at Discharge Intermittent Supervision/Assistance  Patient can return home with the following A little help with walking and/or transfers;A little help with bathing/dressing/bathroom;Assistance with feeding;Assist for transportation;Help with stairs or ramp for entrance   Equipment Recommendations  Other (comment) (defer to post acute)    Recommendations for Other Services Rehab consult     Precautions /  Restrictions Precautions Precautions: Fall Precaution Comments: Parkinson's, posterior bias, baseline aphasic Restrictions Weight Bearing Restrictions: No     Mobility  Bed Mobility               General bed mobility comments: pt was received in recliner and returned to recliner at end of session Patient Response: Cooperative  Transfers Overall transfer level: Needs assistance Equipment used: Rolling walker (2 wheels) Transfers: Sit to/from Stand Sit to Stand: Mod assist           General transfer comment: Pt requires verbal cues for hand placement multiple times with poor re-call. Pt requires Mod A for sit to stand due to posterior bias on first standing with heavy posterior lean.    Ambulation/Gait Ambulation/Gait assistance: Min assist Gait Distance (Feet): 30 Feet Assistive device: Rolling walker (2 wheels) Gait Pattern/deviations: Step-through pattern, Decreased step length - right, Decreased step length - left, Decreased stride length Gait velocity: Decreased cadence. Gait velocity interpretation: <1.31 ft/sec, indicative of household ambulator   General Gait Details: very low foot clearance. Pt did well with verbal cues for walker, step 1/2, walker step 1/2  and turns turn, step 1/2, turn step 1/2 with significant increase in time for activity.        Balance Overall balance assessment: Needs assistance Sitting-balance support: Feet supported, Single extremity supported, No upper extremity supported Sitting balance-Leahy Scale: Fair   Postural control: Posterior lean Standing balance support: During functional activity, Bilateral upper extremity supported Standing balance-Leahy Scale: Poor Standing balance comment: improves after initially getting up but pt has heavy posterior bias initially.          Cognition Arousal/Alertness: Awake/alert Behavior During Therapy: WFL for tasks assessed/performed Overall Cognitive Status: Impaired/Different from  baseline Area  of Impairment: Safety/judgement         Safety/Judgement: Decreased awareness of safety, Decreased awareness of deficits                 General Comments General comments (skin integrity, edema, etc.): Pt most limited by balance problems. Pt with strong posterior lean during all mobility.  Pt's weight is fully on heels with toes off the ground and sometimes front walker wheels of ground and pt still feels as if she is falling foward.      Pertinent Vitals/Pain Pain Assessment Pain Assessment: No/denies pain     PT Goals (current goals can now be found in the care plan section) Acute Rehab PT Goals Patient Stated Goal: be able to get home PT Goal Formulation: With patient Time For Goal Achievement: 04/03/22 Potential to Achieve Goals: Good Progress towards PT goals: Progressing toward goals    Frequency    Min 3X/week      PT Plan Current plan remains appropriate       AM-PAC PT "6 Clicks" Mobility   Outcome Measure  Help needed turning from your back to your side while in a flat bed without using bedrails?: A Little Help needed moving from lying on your back to sitting on the side of a flat bed without using bedrails?: A Little Help needed moving to and from a bed to a chair (including a wheelchair)?: A Little Help needed standing up from a chair using your arms (e.g., wheelchair or bedside chair)?: A Lot Help needed to walk in hospital room?: A Lot Help needed climbing 3-5 steps with a railing? : A Lot 6 Click Score: 15    End of Session Equipment Utilized During Treatment: Gait belt Activity Tolerance: Patient tolerated treatment well Patient left: with chair alarm set;with call bell/phone within reach;in chair Nurse Communication: Mobility status PT Visit Diagnosis: Unsteadiness on feet (R26.81);Other abnormalities of gait and mobility (R26.89)     Time: 1050-1107 PT Time Calculation (min) (ACUTE ONLY): 17 min  Charges:  $Gait  Training: 8-22 mins                     Tomma Rakers, DPT, [place] Office: [phone] (Secure chat preferred)    Ander Purpura 03/23/2022, 11:24 AM

## 2022-03-24 DIAGNOSIS — U071 COVID-19: Secondary | ICD-10-CM | POA: Diagnosis not present

## 2022-03-24 LAB — BASIC METABOLIC PANEL
Anion gap: 12 (ref 5–15)
BUN: 7 mg/dL — ABNORMAL LOW (ref 8–23)
CO2: 25 mmol/L (ref 22–32)
Calcium: 9.1 mg/dL (ref 8.9–10.3)
Chloride: 97 mmol/L — ABNORMAL LOW (ref 98–111)
Creatinine, Ser: 0.65 mg/dL (ref 0.44–1.00)
GFR, Estimated: >60 mL/min (ref >60)
Glucose, Bld: 115 mg/dL — ABNORMAL HIGH (ref 70–99)
Potassium: 4.1 mmol/L (ref 3.5–5.1)
Sodium: 134 mmol/L — ABNORMAL LOW (ref 135–145)

## 2022-03-24 NOTE — Progress Notes (Signed)
Physical Therapy Treatment Patient Details Name: Mandy Durham MRN: [id number] DOB: 03/03/1946 Today's Date: 03/24/2022   History of Present Illness Mandy Durham is a 77 y.o. female with a history of atypical PD with dysarthria, HTN, HLD, SSS s/p PPM, autonomic instability, HFpEF, traumatic SAH with resulting TBI, and prediabetes who presented from home to ED 1/3 with weakness with poor appetite found to have covid-19 infection without pneumonia or respiratory symptoms/hypoxia.    PT Comments    Pt is slowly progressing towards goals. Continues with heavy posterior lean and Moderate physical assistance to prevent posterior fall on standing and with gait. Pt does better with verbal cues for stepping and anteriorly placed AD  to prevent posterior lean during gait. Due to pt current level of function, prior level of function, home set up and available assistance at home currently recommending skilled physical therapy in SNF setting due to decreased endurance and fatigue in order to decrease risk for falls, injury and re-hospitalization.     Recommendations for follow up therapy are one component of a multi-disciplinary discharge planning process, led by the attending physician.  Recommendations may be updated based on patient status, additional functional criteria and insurance authorization.  Follow Up Recommendations  Skilled nursing-short term rehab (<3 hours/day) Can patient physically be transported by private vehicle: No   Assistance Recommended at Discharge Frequent or constant Supervision/Assistance  Patient can return home with the following A little help with walking and/or transfers;A little help with bathing/dressing/bathroom;Assistance with feeding;Assist for transportation;Help with stairs or ramp for entrance   Equipment Recommendations  Other (comment) (defer to post acute)    Recommendations for Other Services Rehab consult     Precautions / Restrictions  Precautions Precautions: Fall Precaution Comments: Parkinson's, posterior bias, baseline aphasic Restrictions Weight Bearing Restrictions: No     Mobility  Bed Mobility Overal bed mobility: Needs Assistance Bed Mobility: Sit to Supine       Sit to supine: Mod assist   General bed mobility comments: Pt was received in recliner. Requires mod A for LE assistance and sequencing. Patient Response: Cooperative, Impulsive  Transfers Overall transfer level: Needs assistance Equipment used: Rolling walker (2 wheels) Transfers: Sit to/from Stand Sit to Stand: Mod assist           General transfer comment: Pt requires verbal cues for hand placement multiple times with poor re-call. Pt requires Mod A for sit to stand due to posterior bias on first standing with heavy posterior lean.    Ambulation/Gait Ambulation/Gait assistance: Min assist Gait Distance (Feet): 20 Feet Assistive device: Rolling walker (2 wheels) Gait Pattern/deviations: Step-through pattern, Decreased step length - right, Decreased step length - left, Decreased stride length Gait velocity: Decreased cadence. Gait velocity interpretation: <1.31 ft/sec, indicative of household ambulator   General Gait Details: very low foot clearance. Pt did well with verbal cues for walker, step 1/2, walker step 1/2  and turns turn, step 1/2, turn step 1/2 with significant increase in time for activity.        Balance Overall balance assessment: Needs assistance Sitting-balance support: Feet supported, Single extremity supported, No upper extremity supported Sitting balance-Leahy Scale: Fair   Postural control: Posterior lean Standing balance support: During functional activity, Bilateral upper extremity supported Standing balance-Leahy Scale: Poor Standing balance comment: improves after initially getting up but pt has heavy posterior bias initially.        Cognition Arousal/Alertness: Awake/alert Behavior During  Therapy: WFL for tasks assessed/performed Overall Cognitive Status: Impaired/Different from  baseline Area of Impairment: Safety/judgement         Orientation Level: Disoriented to, Place       Safety/Judgement: Decreased awareness of safety, Decreased awareness of deficits   Problem Solving: Slow processing, Difficulty sequencing, Requires verbal cues, Requires tactile cues General Comments: Pt perseverated on "eddie" today and states that she has a jacket and tupperware she left on the bench that was not there.           General Comments General comments (skin integrity, edema, etc.): Pt is mostly limited by balance deficits. Strong posterior lean during all mobility. Pt wgt is fully on heels with goes off the ground. Improves with AD placed further outside from BOS.      Pertinent Vitals/Pain Pain Assessment Pain Assessment: No/denies pain     PT Goals (current goals can now be found in the care plan section) Acute Rehab PT Goals Patient Stated Goal: be able to get home PT Goal Formulation: With patient Time For Goal Achievement: 04/03/22 Potential to Achieve Goals: Good Progress towards PT goals: Progressing toward goals    Frequency    Min 3X/week      PT Plan Discharge plan needs to be updated (updated to SNF)       AM-PAC PT "6 Clicks" Mobility   Outcome Measure  Help needed turning from your back to your side while in a flat bed without using bedrails?: A Little Help needed moving from lying on your back to sitting on the side of a flat bed without using bedrails?: A Little Help needed moving to and from a bed to a chair (including a wheelchair)?: A Little Help needed standing up from a chair using your arms (e.g., wheelchair or bedside chair)?: A Lot Help needed to walk in hospital room?: A Lot Help needed climbing 3-5 steps with a railing? : Total 6 Click Score: 14    End of Session Equipment Utilized During Treatment: Gait belt Activity  Tolerance: Patient tolerated treatment well Patient left: in bed;with bed alarm set;with call bell/phone within reach Nurse Communication: Mobility status PT Visit Diagnosis: Unsteadiness on feet (R26.81);Other abnormalities of gait and mobility (R26.89)     Time: 1130-1153 PT Time Calculation (min) (ACUTE ONLY): 23 min  Charges:  $Gait Training: 8-22 mins $Therapeutic Activity: 8-22 mins                     Tomma Rakers, DPT, CLT  Acute Rehabilitation Services Office: [phone] (Secure chat preferred)    Ander Purpura 03/24/2022, 1:48 PM

## 2022-03-24 NOTE — TOC Progression Note (Addendum)
Transition of Care [facility]) - Progression Note    Patient Details  Name: Mandy Durham MRN: [id number] Date of Birth: Feb 18, 1946  Transition of Care [facility]) CM/SW Contact  Joanne Chars, LCSW Phone Number: 03/24/2022, 10:07 AM  Clinical Narrative: Bed offers presented to pt daughter [name] , she will discuss with pt husband and call back.  Auth request made for SNF and PTAR with [facility]   1300: TC message from [place], they want to accept offer at [place].  Waiting on conformation from [place].   Tammie/[facility] updated with facility choice.  1330: Per Kristal/[facility], they are unable to take pt until 10 days after positive covid test.    1515: CSW spoke with pt husband [name] outside pt room, updated him that [place] cannot take until 10 days of quarantine.  He would really like pt to go to [place], will see if the auth comes back sooner than that.  CSW also spoke to daughter [name] and explained the above.   Expected Discharge Plan: Skilled Nursing Facility Barriers to Discharge: SNF Pending bed offer  Expected Discharge Plan and Services In-house Referral: Clinical Social Work   Post Acute Care Choice: [place] Living arrangements for the past 2 months: Single Family Home Expected Discharge Date: 03/24/22                                     Social Determinants of Health (SDOH) Interventions SDOH Screenings   Food Insecurity: No Food Insecurity (03/19/2022)  Recent Concern: [place] Present (02/13/2022)  Housing: Low Risk  (03/19/2022)  Transportation Needs: No Transportation Needs (03/19/2022)  Utilities: Not At Risk (03/19/2022)  Alcohol Screen: Low Risk  (04/30/2021)  Depression (PHQ2-9): Medium Risk (01/28/2022)  Financial Resource Strain: Low Risk  (04/30/2021)  Physical Activity: Inactive (04/30/2021)  Social Connections: Moderately Integrated (04/30/2021)  Stress: Stress Concern Present (04/30/2021)  Tobacco  Use: Low Risk  (03/18/2022)    Readmission Risk Interventions     No data to display

## 2022-03-24 NOTE — Plan of Care (Signed)

## 2022-03-24 NOTE — Progress Notes (Signed)
PROGRESS NOTE  Brief Narrative: Mandy Durham is a 77 y.o. female with a history of atypical PD with dysarthria, HTN, HLD, SSS s/p PPM, autonomic instability, HFpEF, traumatic SAH with resulting TBI, and prediabetes who presented from home to ED 1/3 with weakness with poor appetite found to have covid-19 infection without pneumonia or respiratory symptoms/hypoxia. Paxlovid was started, IVF given and the patient was admitted. No respiratory symptoms have developed, though the patient's weakness continues to require SNF rehabilitation. She has been medically stable for discharge there. This is complicated by covid-positive status, may require 10 days isolation prior to admission there. Otherwise, hospitalization complicated by delirium.  Subjective: No new complaints. Up to [facility] with assistance this morning.  Objective: BP 117/78 (BP Location: Right Arm)   Pulse 100   Temp 97.8 F (36.6 C) (Oral)   Resp 18   Ht '5\' 5"'$  (1.651 m)   Wt 47.1 kg   SpO2 97%   BMI 17.28 kg/m   Gen: No distress, elderly female Pulm: Nonlabored  Neuro: Alert, conversant, oriented this morning. No new focal deficits. Ext: Warm, no deformities Skin: No rashes, lesions or ulcers on visualized skin   Assessment & Plan: Covid-19 infection: No significant symptoms.  - Tylenol prn fever - Completed paxlovid (1/4 - 1/8) - Ppx lovenox - Airborne isolation x10 days per protocol (positive test 1/3) - Incentive spirometry  Generalized weakness, failure to thrive, moderate protein calorie malnutrition. BMI is 17, looks underweight as well, reports several months of dyspnea on exertion. Ketonuria consistent with fasting ketosis. - RD consult appreciated, supplement protein - PT/OT to continue working with patient. SNF is current plan at [place]. - Labs have stabilized. Taking po. Stop IVF.    Hypokalemia: Resolved.  PD:  - Continue home medications for movement disorder, including sinemet, not on amantadine.   Depression:  quiescent.  - Continue SSRI   Hyponatremia: Improved  HFpEF: Euvolemic currently. Has not been taking lasix.  - Stop IVF as above to avoid overload.  SSS s/p PPM  HTN:  - Continue losartan  HLD:  - Continue zetia  Hematuria: Recheck at follow up.  Patrecia Pour, MD Pager on amion 03/24/2022, 4:01 PM

## 2022-03-25 DIAGNOSIS — U071 COVID-19: Secondary | ICD-10-CM | POA: Diagnosis not present

## 2022-03-25 NOTE — TOC Progression Note (Addendum)
Transition of Care [facility]) - Progression Note    Patient Details  Name: Mandy Durham MRN: [id number] Date of Birth: [birth year]  Transition of [facility]) CM/SW Contact  Joanne Chars, LCSW Phone Number: 03/25/2022, 2:22 PM  Clinical Narrative:    1000: CSW attempted to call pt husband, no answer.  CSW spoke with pt daughter [name], updated on [place] being out of network with [facility], will cost $50 per day for first 20 days.  She expects her father to be at the hospital this afternoon.    1400: SNF auth approval from [facility]: SNF 7 days: [id number], PTAR: [id number].  Still need facility choice. Husband not in room.  1630: Husband not in room, attempted to call, goes to voicemail.  CSW spoke with daughter again, she is coming to the hospital now.  Informed that [facility] has been approved.  She will discuss facility choice with father and leave CSW message.    Expected Discharge Plan: Skilled Nursing Facility Barriers to Discharge: SNF Pending bed offer  Expected Discharge Plan and Services In-house Referral: Clinical Social Work   Post Acute Care Choice: [place] Living arrangements for the past 2 months: Single Family Home Expected Discharge Date: 03/24/22                                     Social Determinants of Health (SDOH) Interventions SDOH Screenings   Food Insecurity: No Food Insecurity (03/19/2022)  Recent Concern: [place] Present (02/13/2022)  Housing: Low Risk  (03/19/2022)  Transportation Needs: No Transportation Needs (03/19/2022)  Utilities: Not At Risk (03/19/2022)  Alcohol Screen: Low Risk  (04/30/2021)  Depression (PHQ2-9): Medium Risk (01/28/2022)  Financial Resource Strain: Low Risk  (04/30/2021)  Physical Activity: Inactive (04/30/2021)  Social Connections: Moderately Integrated (04/30/2021)  Stress: Stress Concern Present (04/30/2021)  Tobacco Use: Low Risk  (03/18/2022)    Readmission Risk Interventions     No data to  display

## 2022-03-25 NOTE — Plan of Care (Signed)

## 2022-03-25 NOTE — Progress Notes (Signed)
PROGRESS NOTE  Brief Narrative: Mandy Durham is a 77 y.o. female with a history of atypical PD with dysarthria, HTN, HLD, SSS s/p PPM, autonomic instability, HFpEF, traumatic SAH with resulting TBI, and prediabetes who presented from home to ED 1/3 with weakness with poor appetite found to have covid-19 infection without pneumonia or respiratory symptoms/hypoxia. Paxlovid was started, IVF given and the patient was admitted. No respiratory symptoms have developed, though the patient's weakness continues to require SNF rehabilitation. She has been medically stable for discharge there. This is complicated by covid-positive status, may require 10 days isolation prior to admission there. Otherwise, hospitalization complicated by delirium.  Subjective: Nervous about going to rehab, but no new complaints.  Objective: BP 109/61 (BP Location: Right Arm)   Pulse 95   Temp 98.2 F (36.8 C) (Oral)   Resp 17   Ht '5\' 5"'$  (1.651 m)   Wt 47.1 kg   SpO2 97%   BMI 17.28 kg/m   No distress Clear, nonlabored RRR, no MRG No edema Soft, NT, ND, +BS  Assessment & Plan: Covid-19 infection: No significant symptoms.  - Tylenol prn fever - Completed paxlovid (1/4 - 1/8) - Ppx lovenox - Airborne isolation x10 days per protocol (positive test 1/3) - Incentive spirometry  Generalized weakness, failure to thrive, moderate protein calorie malnutrition. BMI is 17, looks underweight as well, reports several months of dyspnea on exertion. Ketonuria consistent with fasting ketosis. - RD consult appreciated, supplement protein - PT/OT to continue working with patient. SNF is current plan at DC. - Labs have stabilized. Taking po. Stop IVF.    Hypokalemia: Resolved.  PD:  - Continue home medications for movement disorder, including sinemet, not on amantadine.  Depression: quiescent.  - Continue SSRI   Hyponatremia: Improved  HFpEF: Euvolemic currently. Has not been taking lasix.  - Stop IVF as above to  avoid overload.  SSS s/p PPM  HTN:  - Continue losartan  HLD:  - Continue zetia  Hematuria: Recheck at follow up.  Patrecia Pour, MD Pager on amion 03/25/2022, 8:28 AM

## 2022-03-26 DIAGNOSIS — F8081 Childhood onset fluency disorder: Secondary | ICD-10-CM | POA: Diagnosis not present

## 2022-03-26 DIAGNOSIS — G4700 Insomnia, unspecified: Secondary | ICD-10-CM | POA: Diagnosis not present

## 2022-03-26 DIAGNOSIS — G20C Parkinsonism, unspecified: Secondary | ICD-10-CM | POA: Diagnosis not present

## 2022-03-26 DIAGNOSIS — R4782 Fluency disorder in conditions classified elsewhere: Secondary | ICD-10-CM | POA: Diagnosis not present

## 2022-03-26 DIAGNOSIS — R059 Cough, unspecified: Secondary | ICD-10-CM | POA: Diagnosis not present

## 2022-03-26 DIAGNOSIS — E119 Type 2 diabetes mellitus without complications: Secondary | ICD-10-CM | POA: Diagnosis not present

## 2022-03-26 DIAGNOSIS — M40209 Unspecified kyphosis, site unspecified: Secondary | ICD-10-CM | POA: Diagnosis not present

## 2022-03-26 DIAGNOSIS — R5381 Other malaise: Secondary | ICD-10-CM | POA: Diagnosis not present

## 2022-03-26 DIAGNOSIS — E559 Vitamin D deficiency, unspecified: Secondary | ICD-10-CM | POA: Diagnosis not present

## 2022-03-26 DIAGNOSIS — F331 Major depressive disorder, recurrent, moderate: Secondary | ICD-10-CM | POA: Diagnosis not present

## 2022-03-26 DIAGNOSIS — G249 Dystonia, unspecified: Secondary | ICD-10-CM | POA: Diagnosis not present

## 2022-03-26 DIAGNOSIS — U071 COVID-19: Secondary | ICD-10-CM | POA: Diagnosis not present

## 2022-03-26 DIAGNOSIS — I10 Essential (primary) hypertension: Secondary | ICD-10-CM | POA: Diagnosis not present

## 2022-03-26 DIAGNOSIS — M1990 Unspecified osteoarthritis, unspecified site: Secondary | ICD-10-CM | POA: Diagnosis not present

## 2022-03-26 DIAGNOSIS — Z741 Need for assistance with personal care: Secondary | ICD-10-CM | POA: Diagnosis not present

## 2022-03-26 DIAGNOSIS — N390 Urinary tract infection, site not specified: Secondary | ICD-10-CM | POA: Diagnosis not present

## 2022-03-26 DIAGNOSIS — Z743 Need for continuous supervision: Secondary | ICD-10-CM | POA: Diagnosis not present

## 2022-03-26 DIAGNOSIS — F419 Anxiety disorder, unspecified: Secondary | ICD-10-CM | POA: Diagnosis not present

## 2022-03-26 DIAGNOSIS — L98429 Non-pressure chronic ulcer of back with unspecified severity: Secondary | ICD-10-CM | POA: Diagnosis not present

## 2022-03-26 DIAGNOSIS — I5030 Unspecified diastolic (congestive) heart failure: Secondary | ICD-10-CM | POA: Diagnosis not present

## 2022-03-26 DIAGNOSIS — R0602 Shortness of breath: Secondary | ICD-10-CM | POA: Diagnosis not present

## 2022-03-26 DIAGNOSIS — E785 Hyperlipidemia, unspecified: Secondary | ICD-10-CM | POA: Diagnosis not present

## 2022-03-26 DIAGNOSIS — M6281 Muscle weakness (generalized): Secondary | ICD-10-CM | POA: Diagnosis not present

## 2022-03-26 DIAGNOSIS — G5793 Unspecified mononeuropathy of bilateral lower limbs: Secondary | ICD-10-CM | POA: Diagnosis not present

## 2022-03-26 DIAGNOSIS — G243 Spasmodic torticollis: Secondary | ICD-10-CM | POA: Diagnosis not present

## 2022-03-26 DIAGNOSIS — F32A Depression, unspecified: Secondary | ICD-10-CM | POA: Diagnosis not present

## 2022-03-26 DIAGNOSIS — Z961 Presence of intraocular lens: Secondary | ICD-10-CM | POA: Diagnosis not present

## 2022-03-26 DIAGNOSIS — Z950 Presence of cardiac pacemaker: Secondary | ICD-10-CM | POA: Diagnosis not present

## 2022-03-26 DIAGNOSIS — H524 Presbyopia: Secondary | ICD-10-CM | POA: Diagnosis not present

## 2022-03-26 DIAGNOSIS — R7303 Prediabetes: Secondary | ICD-10-CM | POA: Diagnosis not present

## 2022-03-26 DIAGNOSIS — M479 Spondylosis, unspecified: Secondary | ICD-10-CM | POA: Diagnosis not present

## 2022-03-26 DIAGNOSIS — H532 Diplopia: Secondary | ICD-10-CM | POA: Diagnosis not present

## 2022-03-26 DIAGNOSIS — E440 Moderate protein-calorie malnutrition: Secondary | ICD-10-CM | POA: Diagnosis not present

## 2022-03-26 DIAGNOSIS — G20B1 Parkinson's disease with dyskinesia, without mention of fluctuations: Secondary | ICD-10-CM | POA: Diagnosis not present

## 2022-03-26 DIAGNOSIS — I441 Atrioventricular block, second degree: Secondary | ICD-10-CM | POA: Diagnosis not present

## 2022-03-26 DIAGNOSIS — G20B2 Parkinson's disease with dyskinesia, with fluctuations: Secondary | ICD-10-CM | POA: Diagnosis not present

## 2022-03-26 LAB — CREATININE, SERUM
Creatinine, Ser: 0.61 mg/dL (ref 0.44–1.00)
GFR, Estimated: >60 mL/min (ref >60)

## 2022-03-26 MED ORDER — CLONAZEPAM 0.25 MG PO TBDP: 0.2500 mg | ORAL_TABLET | Freq: Two times a day (BID) | ORAL | 0 refills | Status: DC | PRN

## 2022-03-26 MED ORDER — BOOST PLUS PO LIQD: 237.0000 mL | Freq: Two times a day (BID) | ORAL | 0 refills | Status: AC

## 2022-03-26 MED ORDER — BOOST / RESOURCE BREEZE PO LIQD CUSTOM: 1.0000 | Freq: Three times a day (TID) | ORAL | Status: DC

## 2022-03-26 MED ORDER — ALBUTEROL SULFATE (2.5 MG/3ML) 0.083% IN NEBU: 2.5000 mg | INHALATION_SOLUTION | RESPIRATORY_TRACT | 12 refills | Status: DC | PRN

## 2022-03-26 NOTE — Discharge Summary (Signed)
Physician Discharge Summary  Mandy Durham TFT:[id number] DOB: 01-Nov-1945 DOA: 03/18/2022  PCP: Colon Branch, MD  Admit date: 03/18/2022 Discharge date: 03/26/2022  Admitted From: home Discharge disposition: SNF   Recommendations for Outpatient Follow-Up:   Follow up U/A for hematuria   Discharge Diagnosis:   Principal Problem:   COVID-19 Active Problems:   Malnutrition of moderate degree    Discharge Condition: Improved.  Diet recommendation: Low sodium, heart healthy.  Carbohydrate-modified.  Regular.  Wound care: None.  Code status: Full.   History of Present Illness:   Mandy Durham is a 77 y.o. female with a history of atypical PD with dysarthria, HTN, HLD, SSS s/p PPM, autonomic instability, HFpEF, traumatic SAH with resulting TBI, and prediabetes who presented from home to ED 1/3 with weakness with poor appetite found to have covid-19 infection without pneumonia or respiratory symptoms/hypoxia. Paxlovid was started, IVF given and the patient was admitted. No respiratory symptoms have developed, though the patient's weakness continues to require SNF rehabilitation. She has been medically stable for discharge there.  Otherwise, hospitalization complicated by delirium.    Hospital Course by Problem:   Covid-19 infection: No significant symptoms.  - Tylenol prn fever - Completed paxlovid (1/4 - 1/8) - Ppx lovenox -  (positive test 1/3) - Incentive spirometry -SNF when able   Generalized weakness, failure to thrive, moderate protein calorie malnutrition. BMI is 17, looks underweight as well, reports several months of dyspnea on exertion. Ketonuria consistent with fasting ketosis. - RD consult appreciated, supplement protein - PT/OT to continue working with patient. SNF is current plan at DC. - Labs have stabilized. Taking po.     Hypokalemia: Resolved.   PD:  - Continue home medications for movement disorder, including sinemet, not on amantadine.    Depression: quiescent.  - Continue SSRI    Hyponatremia: Improved   HFpEF: Euvolemic currently. Has not been taking lasix.    SSS s/p PPM   HTN:  - Continue losartan   HLD:  - Continue zetia   Hematuria: Recheck at outpatient follow up.    Medical Consultants:   none   Discharge Exam:   Vitals:   03/26/22 0648 03/26/22 0755  BP: (!) 160/87 (!) 148/88  Pulse: 92 90  Resp: 18 19  Temp: 98.8 F (37.1 C) (!) 97.5 F (36.4 C)  SpO2: 96% 97%   Vitals:   03/25/22 0900 03/25/22 1659 03/26/22 0648 03/26/22 0755  BP: 131/84 130/85 (!) 160/87 (!) 148/88  Pulse: 87  92 90  Resp: '17  18 19  '$ Temp: 97.6 F (36.4 C)  98.8 F (37.1 C) (!) 97.5 F (36.4 C)  TempSrc: Oral  Oral Oral  SpO2: 98% 98% 96% 97%  Weight:      Height:        General exam: Appears calm and comfortable.     The results of significant diagnostics from this hospitalization (including imaging, microbiology, ancillary and laboratory) are listed below for reference.     Procedures and Diagnostic Studies:   DG Chest 1 View  Result Date: 03/18/2022 CLINICAL DATA:  Urinary incontinence EXAM: PORTABLE CHEST 1 VIEW COMPARISON:  04/14/2021 FINDINGS: Cardiac shadow is at the upper limits of normal in size. Pacing device is noted. Lungs are clear bilaterally. No bony abnormality is seen. IMPRESSION: No active disease. Electronically Signed   By: Inez Catalina M.D.   On: 03/18/2022 22:23     Labs:   Basic Metabolic Panel: Recent  Labs  Lab 03/20/22 0520 03/21/22 0349 03/22/22 0309 03/22/22 0920 03/23/22 0401 03/24/22 0817 03/26/22 0605  NA 132* 136  --  134* 135 134*  --   K 3.0* 4.1  --  3.8 3.3* 4.1  --   CL 99 107  --  102 98 97*  --   CO2 24 21*  --  '22 29 25  '$ --   GLUCOSE 82 94  --  128* 117* 115*  --   BUN 13 22  --  11 7* 7*  --   CREATININE 0.90 0.82  --  0.66 0.66 0.65 0.61  CALCIUM 8.4* 8.0*  --  8.3* 8.6* 9.1  --   MG 1.9 2.0 1.8  --  1.9  --   --   PHOS 4.5 3.2 2.8  --  3.1  --    --    GFR Estimated Creatinine Clearance: 43.8 mL/min (by C-G formula based on SCr of 0.61 mg/dL). Liver Function Tests: No results for input(s): "AST", "ALT", "ALKPHOS", "BILITOT", "PROT", "ALBUMIN" in the last 168 hours. No results for input(s): "LIPASE", "AMYLASE" in the last 168 hours. No results for input(s): "AMMONIA" in the last 168 hours. Coagulation profile No results for input(s): "INR", "PROTIME" in the last 168 hours.  CBC: Recent Labs  Lab 03/21/22 0349  WBC 6.7  HGB 13.3  HCT 39.2  MCV 83.6  PLT 224   Cardiac Enzymes: No results for input(s): "CKTOTAL", "CKMB", "CKMBINDEX", "TROPONINI" in the last 168 hours. BNP: Invalid input(s): "POCBNP" CBG: No results for input(s): "GLUCAP" in the last 168 hours. D-Dimer No results for input(s): "DDIMER" in the last 72 hours. Hgb A1c No results for input(s): "HGBA1C" in the last 72 hours. Lipid Profile No results for input(s): "CHOL", "HDL", "LDLCALC", "TRIG", "CHOLHDL", "LDLDIRECT" in the last 72 hours. Thyroid function studies No results for input(s): "TSH", "T4TOTAL", "T3FREE", "THYROIDAB" in the last 72 hours.  Invalid input(s): "FREET3" Anemia work up No results for input(s): "VITAMINB12", "FOLATE", "FERRITIN", "TIBC", "IRON", "RETICCTPCT" in the last 72 hours. Microbiology Recent Results (from the past 240 hour(s))  Resp panel by RT-PCR (RSV, Flu A&B, Covid) Anterior Nasal Swab     Status: Abnormal   Collection Time: 03/18/22  9:49 PM   Specimen: Anterior Nasal Swab  Result Value Ref Range Status   SARS Coronavirus 2 by RT PCR POSITIVE (A) NEGATIVE Final    Comment: (NOTE) SARS-CoV-2 target nucleic acids are DETECTED.  The SARS-CoV-2 RNA is generally detectable in upper respiratory specimens during the acute phase of infection. Positive results are indicative of the presence of the identified virus, but do not rule out bacterial infection or co-infection with other pathogens not detected by the test.  Clinical correlation with patient history and other diagnostic information is necessary to determine patient infection status. The expected result is Negative.  Fact Sheet for Patients: EntrepreneurPulse.com.au  Fact Sheet for Healthcare Providers: IncredibleEmployment.be  This test is not yet approved or cleared by the [place] FDA and  has been authorized for detection and/or diagnosis of SARS-CoV-2 by FDA under an Emergency Use Authorization (EUA).  This EUA will remain in effect (meaning this test can be used) for the duration of  the COVID-19 declaration under Section 564(b)(1) of the A ct, 21 U.S.C. section 360bbb-3(b)(1), unless the authorization is terminated or revoked sooner.     Influenza A by PCR NEGATIVE NEGATIVE Final   Influenza B by PCR NEGATIVE NEGATIVE Final    Comment: (NOTE) The  Xpert Xpress SARS-CoV-2/FLU/RSV plus assay is intended as an aid in the diagnosis of influenza from Nasopharyngeal swab specimens and should not be used as a sole basis for treatment. Nasal washings and aspirates are unacceptable for Xpert Xpress SARS-CoV-2/FLU/RSV testing.  Fact Sheet for Patients: EntrepreneurPulse.com.au  Fact Sheet for Healthcare Providers: IncredibleEmployment.be  This test is not yet approved or cleared by the [place] FDA and has been authorized for detection and/or diagnosis of SARS-CoV-2 by FDA under an Emergency Use Authorization (EUA). This EUA will remain in effect (meaning this test can be used) for the duration of the COVID-19 declaration under Section 564(b)(1) of the Act, 21 U.S.C. section 360bbb-3(b)(1), unless the authorization is terminated or revoked.     Resp Syncytial Virus by PCR NEGATIVE NEGATIVE Final    Comment: (NOTE) Fact Sheet for Patients: EntrepreneurPulse.com.au  Fact Sheet for Healthcare  Providers: IncredibleEmployment.be  This test is not yet approved or cleared by the [place] FDA and has been authorized for detection and/or diagnosis of SARS-CoV-2 by FDA under an Emergency Use Authorization (EUA). This EUA will remain in effect (meaning this test can be used) for the duration of the COVID-19 declaration under Section 564(b)(1) of the Act, 21 U.S.C. section 360bbb-3(b)(1), unless the authorization is terminated or revoked.  Performed at [facility], [place] [address] [place], [place] [address]      Discharge Instructions:   Discharge Instructions     Diet general   Complete by: As directed    Increase activity slowly   Complete by: As directed       Allergies as of 03/26/2022   No Known Allergies      Medication List     STOP taking these medications    amantadine 100 MG capsule Commonly known as: SYMMETREL   furosemide 20 MG tablet Commonly known as: LASIX   potassium chloride 10 MEQ tablet Commonly known as: KLOR-CON   traMADol 50 MG tablet Commonly known as: ULTRAM       TAKE these medications    acetaminophen 500 MG tablet Commonly known as: TYLENOL Take 500 mg by mouth every 6 (six) hours as needed for moderate pain.   albuterol (2.5 MG/3ML) 0.083% nebulizer solution Commonly known as: PROVENTIL Take 3 mLs (2.5 mg total) by nebulization every 2 (two) hours as needed for wheezing.   aspirin EC 81 MG tablet Take 81 mg by mouth daily. Swallow whole.   baclofen 10 MG tablet Commonly known as: LIORESAL Take 1 tablet (10 mg total) by mouth 2 (two) times daily as needed for muscle spasms.   Breztri Aerosphere 160-9-4.8 MCG/ACT Aero Generic drug: Budeson-Glycopyrrol-Formoterol Inhale 2 puffs into the lungs in the morning and at bedtime. What changed:  when to take this reasons to take this   carbidopa-levodopa 25-100 MG tablet Commonly known as: SINEMET IR Take 1 tablet by mouth 4 times daily at  8am/noon/4pm/8pm What changed:  how much to take how to take this when to take this additional instructions   clonazePAM 0.25 MG disintegrating tablet Commonly known as: KLONOPIN Take 1 tablet (0.25 mg total) by mouth 2 (two) times daily as needed (Anxiety).   escitalopram 10 MG tablet Commonly known as: LEXAPRO Take 1 tablet (10 mg total) by mouth daily.   ezetimibe 10 MG tablet Commonly known as: ZETIA Take 1 tablet (10 mg total) by mouth daily.   lactose free nutrition Liqd Take 237 mLs by mouth 2 (two) times daily between meals.  losartan 100 MG tablet Commonly known as: COZAAR Take 1 tablet (100 mg total) by mouth daily.   nitroGLYCERIN 0.4 MG SL tablet Commonly known as: NITROSTAT Place 1 tablet (0.4 mg total) under the tongue every 5 (five) minutes x 3 doses as needed for chest pain.   VITAMIN D PO Take 1 tablet by mouth 2 (two) times a week. Vitamin D        Follow-up Information     Colon Branch, MD. Call in 3 days.   Specialty: Internal Medicine Contact information: [address] [place] [address] [phone]                  Time coordinating discharge: 35 min  Signed:  [name] DO  [facility] 03/26/2022, 11:30 AM

## 2022-03-26 NOTE — Progress Notes (Signed)
1/11 Pt under Contact and Airborne precautions. No one available for acknowledgement, letter mailed to the address on file.

## 2022-03-26 NOTE — Progress Notes (Signed)
Patient picked up by [facility].  Patient discharged to [facility].

## 2022-03-26 NOTE — Progress Notes (Signed)
[facility] for report, RN unavailable at this time.  Left this RNs call back number.

## 2022-03-26 NOTE — Progress Notes (Signed)
PROGRESS NOTE  Brief Narrative: Mandy Durham is a 77 y.o. female with a history of atypical PD with dysarthria, HTN, HLD, SSS s/p PPM, autonomic instability, HFpEF, traumatic SAH with resulting TBI, and prediabetes who presented from home to ED 1/3 with weakness with poor appetite found to have covid-19 infection without pneumonia or respiratory symptoms/hypoxia. Paxlovid was started, IVF given and the patient was admitted. No respiratory symptoms have developed, though the patient's weakness continues to require SNF rehabilitation. She has been medically stable for discharge there. This is complicated by covid-positive status, may require 10 days isolation prior to admission there. Otherwise, hospitalization complicated by delirium.  Subjective: Does not remember how long she has been in the hospital   Objective: BP (!) 148/88 (BP Location: Left Arm)   Pulse 90   Temp (!) 97.5 F (36.4 C) (Oral)   Resp 19   Ht '5\' 5"'$  (1.651 m)   Wt 47.1 kg   SpO2 97%   BMI 17.28 kg/m    General: Appearance:    Thin female in no acute distress     Lungs:     Clear to auscultation bilaterally, respirations unlabored  Heart:    Normal heart rate. Normal rhythm. No murmurs, rubs, or gallops.   MS:   All extremities are intact.   Neurologic:   Awake, alert, pleasant-- some speech finding difficulty      Assessment & Plan: Covid-19 infection: No significant symptoms.  - Tylenol prn fever - Completed paxlovid (1/4 - 1/8) - Ppx lovenox - Airborne isolation x10 days per protocol (positive test 1/3) - Incentive spirometry -SNF when able  Generalized weakness, failure to thrive, moderate protein calorie malnutrition. BMI is 17, looks underweight as well, reports several months of dyspnea on exertion. Ketonuria consistent with fasting ketosis. - RD consult appreciated, supplement protein - PT/OT to continue working with patient. SNF is current plan at DC. - Labs have stabilized. Taking po. Stop IVF.     Hypokalemia: Resolved.  PD:  - Continue home medications for movement disorder, including sinemet, not on amantadine.  Depression: quiescent.  - Continue SSRI   Hyponatremia: Improved  HFpEF: Euvolemic currently. Has not been taking lasix.  - Stop IVF as above to avoid overload.  SSS s/p PPM  HTN:  - Continue losartan  HLD:  - Continue zetia  Hematuria: Recheck at outpatient follow up.  [name], DO Pager on [facility] 03/26/2022, 9:54 AM

## 2022-03-26 NOTE — Progress Notes (Signed)
Physical Therapy Treatment Patient Details Name: Mandy Durham MRN: [id number] DOB: [birth year] Today's Date: 03/26/2022   History of Present Illness Mandy Durham is a 77 y.o. female with a history of atypical PD with dysarthria, HTN, HLD, SSS s/p PPM, autonomic instability, HFpEF, traumatic SAH with resulting TBI, and prediabetes who presented from home to ED 1/3 with weakness with poor appetite found to have covid-19 infection without pneumonia or respiratory symptoms/hypoxia.    PT Comments    Pt is slowly progressing towards goals. Due to the nature of co-morbidities pt will have better days and worse days functionally. Currently pt is requiring Mod A and was only able to ambulate a very short distance. Pt continues with heavy posterior bias during ambulation. Due to pt current functional status, home set up and available assistance at home recommending skilled physical therapy services in SNF setting on discharge from acute care hospital setting in order to decrease need for physical assist, decrease risk for immobility/falls and decrease risk for re-hospitalization. Pt HR 87 and O2 sats 97% after gait.     Recommendations for follow up therapy are one component of a multi-disciplinary discharge planning process, led by the attending physician.  Recommendations may be updated based on patient status, additional functional criteria and insurance authorization.  Follow Up Recommendations  Skilled nursing-short term rehab (<3 hours/day) Can patient physically be transported by private vehicle: No   Assistance Recommended at Discharge Frequent or constant Supervision/Assistance  Patient can return home with the following Assist for transportation;Help with stairs or ramp for entrance;A lot of help with walking and/or transfers   Equipment Recommendations  Other (comment) (defer to post acute)    Recommendations for Other Services       Precautions / Restrictions Precautions Precautions:  Fall Precaution Comments: Parkinson's, posterior bias, baseline aphasic Restrictions Weight Bearing Restrictions: No     Mobility  Bed Mobility Overal bed mobility: Needs Assistance Bed Mobility: Sit to Supine     Supine to sit: Min assist Sit to supine: Min guard   General bed mobility comments: Min A at trunk for supine to sitting and MIn A at bil LE for sitting to supine. Pt requires step to step verbal instructions and increased time to perform activity Patient Response: Impulsive, Cooperative  Transfers Overall transfer level: Needs assistance Equipment used: Rolling walker (2 wheels) Transfers: Sit to/from Stand Sit to Stand: Mod assist           General transfer comment: Pt was slightly heavier assist today and attempted to stand 2x prior to standing due to inreased weakness and fatigue today. Pt performed 3x from EOB. Is able to state correct hand placement for sit to stand but places hands in the wrong position trying to pull up from walker.    Ambulation/Gait Ambulation/Gait assistance: Mod assist Gait Distance (Feet): 5 Feet Assistive device: Rolling walker (2 wheels) Gait Pattern/deviations: Step-through pattern, Decreased step length - right, Decreased step length - left, Decreased stride length, Shuffle Gait velocity: Decreased cadence. Gait velocity interpretation: <1.31 ft/sec, indicative of household ambulator   General Gait Details: very low foot clearance. Pt did not respond as well to verbal cues today. pt was very fatigued and unable to ambulate as far as the other day. Pt was able to stand up again and take side steps with improvement with mutli modal cueing.        Balance Overall balance assessment: Needs assistance Sitting-balance support: Feet supported, Single extremity supported, No upper extremity supported  Sitting balance-Leahy Scale: Fair     Standing balance support: During functional activity, Bilateral upper extremity  supported Standing balance-Leahy Scale: Poor Standing balance comment: improves after initially getting up but pt has heavy posterior bias initially.        Cognition Arousal/Alertness: Awake/alert Behavior During Therapy: WFL for tasks assessed/performed Overall Cognitive Status: Impaired/Different from baseline Area of Impairment: Safety/judgement                 Orientation Level: Disoriented to, Place       Safety/Judgement: Decreased awareness of safety, Decreased awareness of deficits     General Comments: Pt continues to think someone is in her room. Is aware when oriented that there is not.           General Comments General comments (skin integrity, edema, etc.): Pt appears more fatigued today. Slight increase in need for physical assistance.      Pertinent Vitals/Pain Pain Assessment Pain Assessment: No/denies pain     PT Goals (current goals can now be found in the care plan section) Acute Rehab PT Goals Patient Stated Goal: be able to get home PT Goal Formulation: With patient Time For Goal Achievement: 04/03/22 Potential to Achieve Goals: Good Progress towards PT goals: Progressing toward goals    Frequency    Min 3X/week      PT Plan Current plan remains appropriate       AM-PAC PT "6 Clicks" Mobility   Outcome Measure  Help needed turning from your back to your side while in a flat bed without using bedrails?: A Little Help needed moving from lying on your back to sitting on the side of a flat bed without using bedrails?: A Little Help needed moving to and from a bed to a chair (including a wheelchair)?: A Little Help needed standing up from a chair using your arms (e.g., wheelchair or bedside chair)?: A Lot Help needed to walk in hospital room?: A Lot Help needed climbing 3-5 steps with a railing? : Total 6 Click Score: 14    End of Session Equipment Utilized During Treatment: Gait belt Activity Tolerance: Patient tolerated  treatment well Patient left: in bed;with bed alarm set;with call bell/phone within reach Nurse Communication: Mobility status PT Visit Diagnosis: Unsteadiness on feet (R26.81);Other abnormalities of gait and mobility (R26.89)     Time: 6811-5726 PT Time Calculation (min) (ACUTE ONLY): 24 min  Charges:  $Gait Training: 8-22 mins $Therapeutic Activity: 8-22 mins                     Tomma Rakers, DPT, CLT  Acute Rehabilitation Services Office: [phone] (Secure chat preferred)    Ander Purpura 03/26/2022, 2:54 PM

## 2022-03-26 NOTE — Plan of Care (Signed)

## 2022-03-26 NOTE — TOC Transition Note (Signed)
Transition of Care [facility]) - CM/SW Discharge Note   Patient Details  Name: Mandy Durham MRN: [id number] Date of Birth: 1945/03/30  [facility] CM/SW Contact:  Joanne Chars, LCSW Phone Number: 03/26/2022, 1:33 PM   Clinical Narrative:   Pt discharging to [facility],  RN call [phone] for report.     Final next level of care: Skilled Nursing Facility Barriers to Discharge: Barriers Resolved   Patient Goals and CMS Choice      Discharge Placement                Patient chooses bed at:  [facility]) Patient to be transferred to facility by: [place] Name of family member notified: husband Eddie Patient and family notified of of transfer: 03/26/22  Discharge Plan and Services Additional resources added to the After Visit Summary for   In-house Referral: Clinical Social Work   Post Acute Care Choice: [place]                               Social Determinants of Health (SDOH) Interventions SDOH Screenings   Food Insecurity: No Food Insecurity (03/19/2022)  Recent Concern: [place] Present (02/13/2022)  Housing: Low Risk  (03/19/2022)  Transportation Needs: No Transportation Needs (03/19/2022)  Utilities: Not At Risk (03/19/2022)  Alcohol Screen: Low Risk  (04/30/2021)  Depression (PHQ2-9): Medium Risk (01/28/2022)  Financial Resource Strain: Low Risk  (04/30/2021)  Physical Activity: Inactive (04/30/2021)  Social Connections: Moderately Integrated (04/30/2021)  Stress: Stress Concern Present (04/30/2021)  Tobacco Use: Low Risk  (03/18/2022)     Readmission Risk Interventions     No data to display

## 2022-03-26 NOTE — TOC Progression Note (Addendum)
Transition of Care [facility]) - Progression Note    Patient Details  Name: Mandy Durham MRN: [id number] Date of Birth: [birth year]  Transition of Care [facility]) CM/SW Contact  Joanne Chars, LCSW Phone Number: 03/26/2022, 11:09 AM  Clinical Narrative:   [facility] cannot take pt until 1/13.  CSW spoke to [place]: pt will need to choose from SNF offers available if ready for DC.  CSW spoke with pt husband [name] and informed him of this, he will accept offer at [facility].  CSW LM with [facility] central intake.  CSW spoke with Christine/Central intake and they can accept pt today.  1130: SNF choice provided to Stephanie/HTA.  Expected Discharge Plan: Skilled Nursing Facility Barriers to Discharge: SNF Pending bed offer  Expected Discharge Plan and Services In-house Referral: Clinical Social Work   Post Acute Care Choice: [place] Living arrangements for the past 2 months: Single Family Home Expected Discharge Date: 03/24/22                                     Social Determinants of Health (SDOH) Interventions SDOH Screenings   Food Insecurity: No Food Insecurity (03/19/2022)  Recent Concern: [place] Present (02/13/2022)  Housing: Low Risk  (03/19/2022)  Transportation Needs: No Transportation Needs (03/19/2022)  Utilities: Not At Risk (03/19/2022)  Alcohol Screen: Low Risk  (04/30/2021)  Depression (PHQ2-9): Medium Risk (01/28/2022)  Financial Resource Strain: Low Risk  (04/30/2021)  Physical Activity: Inactive (04/30/2021)  Social Connections: Moderately Integrated (04/30/2021)  Stress: Stress Concern Present (04/30/2021)  Tobacco Use: Low Risk  (03/18/2022)    Readmission Risk Interventions     No data to display

## 2022-03-29 DIAGNOSIS — G4700 Insomnia, unspecified: Secondary | ICD-10-CM | POA: Diagnosis not present

## 2022-03-29 DIAGNOSIS — U071 COVID-19: Secondary | ICD-10-CM | POA: Diagnosis not present

## 2022-03-29 DIAGNOSIS — I10 Essential (primary) hypertension: Secondary | ICD-10-CM | POA: Diagnosis not present

## 2022-03-29 DIAGNOSIS — E785 Hyperlipidemia, unspecified: Secondary | ICD-10-CM | POA: Diagnosis not present

## 2022-03-29 DIAGNOSIS — R7303 Prediabetes: Secondary | ICD-10-CM | POA: Diagnosis not present

## 2022-03-29 DIAGNOSIS — M1990 Unspecified osteoarthritis, unspecified site: Secondary | ICD-10-CM | POA: Diagnosis not present

## 2022-04-02 DIAGNOSIS — G4700 Insomnia, unspecified: Secondary | ICD-10-CM | POA: Diagnosis not present

## 2022-04-02 DIAGNOSIS — R5381 Other malaise: Secondary | ICD-10-CM | POA: Diagnosis not present

## 2022-04-02 DIAGNOSIS — E785 Hyperlipidemia, unspecified: Secondary | ICD-10-CM | POA: Diagnosis not present

## 2022-04-02 DIAGNOSIS — M1990 Unspecified osteoarthritis, unspecified site: Secondary | ICD-10-CM | POA: Diagnosis not present

## 2022-04-02 DIAGNOSIS — U071 COVID-19: Secondary | ICD-10-CM | POA: Diagnosis not present

## 2022-04-02 DIAGNOSIS — I10 Essential (primary) hypertension: Secondary | ICD-10-CM | POA: Diagnosis not present

## 2022-04-02 NOTE — Progress Notes (Deleted)
Cardiology Office Note Date:  04/02/2022  Patient ID:  Mandy Durham, Mandy Durham June 04, 1945, MRN [id number] PCP:  Colon Branch, MD  Cardiologist:  None Electrophysiologist: Will Meredith Leeds, MD  ***refresh   Chief Complaint: ***  History of Present Illness: Mandy Durham is a 77 y.o. female with PMH notable for HTN, LBBB, Mobitz 2 s/p PPM, diastolic CHF; seen today for Will Meredith Leeds, MD for routine electrophysiology followup. Since last being seen in our clinic the patient reports doing ***.    She has seen [facility] [place] and [place] many times over the past couple years. Overall doing ok, plagued by SOB which seems to have improved with alternating lasix 40/20 daily   she denies chest pain, palpitations, dyspnea, PND, orthopnea, nausea, vomiting, dizziness, syncope, edema, weight gain, or early satiety.     Device Information: MDT dual chamber PM, imp 02/21/2016  AAD History: ***  Past Medical History:  Diagnosis Date   Anxiety state, unspecified    Hyperlipidemia    Hypertension    Insomnia    LBBB (left bundle branch block)    LHC in 2002 showed normal coronaries.    Osteoarthrosis, unspecified whether generalized or localized, unspecified site    Overweight(278.02)    Parkinson disease    S/P placement of cardiac pacemaker    a. 02/21/16: Medtronic Advisa DR MRI SureScan model [id number] (serial number [id number])    Serrated adenoma of colon 2007   Symptomatic menopausal or female climacteric states    Syncope and collapse    11/12: Holter 12/12 with rare PACS, HR range 64-140, average 82, no significant arrhythmias. Echo (1/13): EF 50-55%, mild LVH, septal-lateral dyssynchrony c/w LBBB. 3-week event monitor (1/13): No significant arrhythmia.     Past Surgical History:  Procedure Laterality Date   COLONOSCOPY  2007   EP IMPLANTABLE DEVICE N/A 02/21/2016   Procedure: Pacemaker Implant;  Surgeon: Will Meredith Leeds, MD;  Location: [facility];  Service:  Cardiovascular;  Laterality: N/A;   POLYPECTOMY      Current Outpatient Medications  Medication Instructions   acetaminophen (TYLENOL) 500 mg, Oral, Every 6 hours PRN   albuterol (PROVENTIL) 2.5 mg, Nebulization, Every 2 hours PRN   aspirin EC 81 mg, Oral, Daily, Swallow whole.    baclofen (LIORESAL) 10 mg, Oral, 2 times daily PRN   Budeson-Glycopyrrol-Formoterol (BREZTRI AEROSPHERE) 160-9-4.8 MCG/ACT AERO 2 puffs, Inhalation, 2 times daily   carbidopa-levodopa (SINEMET IR) 25-100 MG tablet Take 1 tablet by mouth 4 times daily at 8am/noon/4pm/8pm   Cholecalciferol (VITAMIN D PO) 1 tablet, Oral, 2 times weekly, Vitamin D   clonazePAM (KLONOPIN) 0.25 mg, Oral, 2 times daily PRN   escitalopram (LEXAPRO) 10 mg, Oral, Daily   ezetimibe (ZETIA) 10 mg, Oral, Daily   lactose free nutrition (BOOST PLUS) LIQD 237 mLs, Oral, 2 times daily between meals   losartan (COZAAR) 100 mg, Oral, Daily   nitroGLYCERIN (NITROSTAT) 0.4 mg, Sublingual, Every 5 min x3 PRN      Social History:  The patient  reports that she has never smoked. She has never been exposed to tobacco smoke. She has never used smokeless tobacco. She reports that she does not drink alcohol and does not use drugs.   Family History:  *** include only if pertinent The patient's family history includes Coronary artery disease in her mother; Dementia in her mother; Diabetes in her mother; Heart failure in her mother.***  ROS:  Please see the history of present  illness. All other systems are reviewed and otherwise negative.   PHYSICAL EXAM: *** VS:  There were no vitals taken for this visit. BMI: There is no height or weight on file to calculate BMI.  GEN- The patient is well appearing, alert and oriented x 3 today.   HEENT: normocephalic, atraumatic; sclera clear, conjunctiva pink; hearing intact; oropharynx clear; neck supple, no JVP Lungs- Clear to ausculation bilaterally, normal work of breathing.  No wheezes, rales, rhonchi Heart-  {Blank single:19197::"Regular","Irregularly irregular"} rate and rhythm, no murmurs, rubs or gallops, PMI not laterally displaced GI- soft, non-tender, non-distended, bowel sounds present, no hepatosplenomegaly Extremities- {EDEMA LEVEL:28147::"No"} peripheral edema. no clubbing or cyanosis; DP/PT/radial pulses 2+ bilaterally MS- no significant deformity or atrophy Skin- warm and dry, no rash or lesion, *** device pocket well-healed Psych- euthymic mood, full affect Neuro- strength and sensation are intact   Device interrogation done today and reviewed by myself:  Battery *** Lead thresholds, impedence, sensing stable *** *** episodes *** changes made today  EKG {ACTION; IS/IS GI:087931 ordered. Personal review of EKG from {Blank single:19197::"today","***"} shows:  ***  Recent Labs: 03/18/2022: ALT 23 03/19/2022: TSH 1.397 03/21/2022: Hemoglobin 13.3; Platelets 224 03/23/2022: Magnesium 1.9 03/24/2022: BUN 7; Potassium 4.1; Sodium 134 03/26/2022: Creatinine, Ser 0.61  05/26/2021: Cholesterol 200; HDL 108.70; LDL Cholesterol 76; Total CHOL/HDL Ratio 2; Triglycerides 75.0; VLDL 15.0   Estimated Creatinine Clearance: 43.8 mL/min (by C-G formula based on SCr of 0.61 mg/dL).   Wt Readings from Last 3 Encounters:  03/20/22 103 lb 13.4 oz (47.1 kg)  01/28/22 104 lb 6 oz (47.3 kg)  12/18/21 107 lb 8 oz (48.8 kg)     Additional studies reviewed include: Previous EP, cardiology notes.     ASSESSMENT AND PLAN:  #) ***   #) ***    Current medicines are reviewed at length with the patient today.   The patient {ACTIONS; HAS/DOES NOT HAVE:19233} concerns regarding her medicines.  The following changes were made today:  {NONE DEFAULTED:18576}  Labs/ tests ordered today include: *** No orders of the defined types were placed in this encounter.    Disposition: Follow up with {EPMDS:28135} in {EPFOLLOW UP:28173}   Signed, Mamie Levers, NP  04/02/22  1:26 PM   Electrophysiology [facility]

## 2022-04-03 ENCOUNTER — Ambulatory Visit: Payer: PPO | Admitting: Physician Assistant

## 2022-04-06 DIAGNOSIS — M1990 Unspecified osteoarthritis, unspecified site: Secondary | ICD-10-CM | POA: Diagnosis not present

## 2022-04-06 DIAGNOSIS — L98429 Non-pressure chronic ulcer of back with unspecified severity: Secondary | ICD-10-CM | POA: Diagnosis not present

## 2022-04-06 DIAGNOSIS — R5381 Other malaise: Secondary | ICD-10-CM | POA: Diagnosis not present

## 2022-04-06 DIAGNOSIS — E785 Hyperlipidemia, unspecified: Secondary | ICD-10-CM | POA: Diagnosis not present

## 2022-04-06 DIAGNOSIS — I10 Essential (primary) hypertension: Secondary | ICD-10-CM | POA: Diagnosis not present

## 2022-04-15 DIAGNOSIS — E785 Hyperlipidemia, unspecified: Secondary | ICD-10-CM | POA: Diagnosis not present

## 2022-04-15 DIAGNOSIS — I10 Essential (primary) hypertension: Secondary | ICD-10-CM | POA: Diagnosis not present

## 2022-04-15 DIAGNOSIS — R5381 Other malaise: Secondary | ICD-10-CM | POA: Diagnosis not present

## 2022-04-15 DIAGNOSIS — R7303 Prediabetes: Secondary | ICD-10-CM | POA: Diagnosis not present

## 2022-04-15 DIAGNOSIS — M1990 Unspecified osteoarthritis, unspecified site: Secondary | ICD-10-CM | POA: Diagnosis not present

## 2022-04-20 DIAGNOSIS — G20B1 Parkinson's disease with dyskinesia, without mention of fluctuations: Secondary | ICD-10-CM | POA: Diagnosis not present

## 2022-04-20 DIAGNOSIS — M1990 Unspecified osteoarthritis, unspecified site: Secondary | ICD-10-CM | POA: Diagnosis not present

## 2022-04-20 DIAGNOSIS — E785 Hyperlipidemia, unspecified: Secondary | ICD-10-CM | POA: Diagnosis not present

## 2022-04-20 DIAGNOSIS — I10 Essential (primary) hypertension: Secondary | ICD-10-CM | POA: Diagnosis not present

## 2022-04-20 DIAGNOSIS — F419 Anxiety disorder, unspecified: Secondary | ICD-10-CM | POA: Diagnosis not present

## 2022-04-21 DIAGNOSIS — Z961 Presence of intraocular lens: Secondary | ICD-10-CM | POA: Diagnosis not present

## 2022-04-21 DIAGNOSIS — H524 Presbyopia: Secondary | ICD-10-CM | POA: Diagnosis not present

## 2022-04-25 DIAGNOSIS — U071 COVID-19: Secondary | ICD-10-CM | POA: Diagnosis not present

## 2022-04-25 DIAGNOSIS — M6281 Muscle weakness (generalized): Secondary | ICD-10-CM | POA: Diagnosis not present

## 2022-04-27 ENCOUNTER — Ambulatory Visit: Payer: PPO | Admitting: Podiatry

## 2022-04-27 DIAGNOSIS — M6281 Muscle weakness (generalized): Secondary | ICD-10-CM | POA: Diagnosis not present

## 2022-04-27 DIAGNOSIS — Z741 Need for assistance with personal care: Secondary | ICD-10-CM | POA: Diagnosis not present

## 2022-04-29 DIAGNOSIS — N390 Urinary tract infection, site not specified: Secondary | ICD-10-CM | POA: Diagnosis not present

## 2022-04-30 DIAGNOSIS — N390 Urinary tract infection, site not specified: Secondary | ICD-10-CM | POA: Diagnosis not present

## 2022-04-30 DIAGNOSIS — I10 Essential (primary) hypertension: Secondary | ICD-10-CM | POA: Diagnosis not present

## 2022-04-30 DIAGNOSIS — E785 Hyperlipidemia, unspecified: Secondary | ICD-10-CM | POA: Diagnosis not present

## 2022-05-06 DIAGNOSIS — R296 Repeated falls: Secondary | ICD-10-CM | POA: Diagnosis not present

## 2022-05-11 ENCOUNTER — Ambulatory Visit: Payer: PPO | Admitting: Podiatry

## 2022-05-11 DIAGNOSIS — F331 Major depressive disorder, recurrent, moderate: Secondary | ICD-10-CM | POA: Diagnosis not present

## 2022-05-13 DIAGNOSIS — G20B1 Parkinson's disease with dyskinesia, without mention of fluctuations: Secondary | ICD-10-CM | POA: Diagnosis not present

## 2022-05-13 DIAGNOSIS — I10 Essential (primary) hypertension: Secondary | ICD-10-CM | POA: Diagnosis not present

## 2022-05-13 DIAGNOSIS — R5381 Other malaise: Secondary | ICD-10-CM | POA: Diagnosis not present

## 2022-05-13 DIAGNOSIS — E785 Hyperlipidemia, unspecified: Secondary | ICD-10-CM | POA: Diagnosis not present

## 2022-05-13 DIAGNOSIS — M1990 Unspecified osteoarthritis, unspecified site: Secondary | ICD-10-CM | POA: Diagnosis not present

## 2022-05-14 DIAGNOSIS — G243 Spasmodic torticollis: Secondary | ICD-10-CM | POA: Diagnosis not present

## 2022-05-14 DIAGNOSIS — G20C Parkinsonism, unspecified: Secondary | ICD-10-CM | POA: Diagnosis not present

## 2022-05-16 DIAGNOSIS — F411 Generalized anxiety disorder: Secondary | ICD-10-CM | POA: Diagnosis not present

## 2022-05-16 DIAGNOSIS — Z681 Body mass index (BMI) 19 or less, adult: Secondary | ICD-10-CM | POA: Diagnosis not present

## 2022-05-16 DIAGNOSIS — E785 Hyperlipidemia, unspecified: Secondary | ICD-10-CM | POA: Diagnosis not present

## 2022-05-16 DIAGNOSIS — I447 Left bundle-branch block, unspecified: Secondary | ICD-10-CM | POA: Diagnosis not present

## 2022-05-16 DIAGNOSIS — G4700 Insomnia, unspecified: Secondary | ICD-10-CM | POA: Diagnosis not present

## 2022-05-16 DIAGNOSIS — Z7982 Long term (current) use of aspirin: Secondary | ICD-10-CM | POA: Diagnosis not present

## 2022-05-16 DIAGNOSIS — Z950 Presence of cardiac pacemaker: Secondary | ICD-10-CM | POA: Diagnosis not present

## 2022-05-16 DIAGNOSIS — Z8616 Personal history of COVID-19: Secondary | ICD-10-CM | POA: Diagnosis not present

## 2022-05-16 DIAGNOSIS — Z9181 History of falling: Secondary | ICD-10-CM | POA: Diagnosis not present

## 2022-05-16 DIAGNOSIS — I110 Hypertensive heart disease with heart failure: Secondary | ICD-10-CM | POA: Diagnosis not present

## 2022-05-16 DIAGNOSIS — E663 Overweight: Secondary | ICD-10-CM | POA: Diagnosis not present

## 2022-05-16 DIAGNOSIS — G20B1 Parkinson's disease with dyskinesia, without mention of fluctuations: Secondary | ICD-10-CM | POA: Diagnosis not present

## 2022-05-16 DIAGNOSIS — I5032 Chronic diastolic (congestive) heart failure: Secondary | ICD-10-CM | POA: Diagnosis not present

## 2022-05-16 DIAGNOSIS — R7303 Prediabetes: Secondary | ICD-10-CM | POA: Diagnosis not present

## 2022-05-16 DIAGNOSIS — I495 Sick sinus syndrome: Secondary | ICD-10-CM | POA: Diagnosis not present

## 2022-05-18 ENCOUNTER — Ambulatory Visit (INDEPENDENT_AMBULATORY_CARE_PROVIDER_SITE_OTHER): Payer: PPO | Admitting: Podiatry

## 2022-05-18 ENCOUNTER — Encounter: Payer: Self-pay | Admitting: Podiatry

## 2022-05-18 ENCOUNTER — Telehealth: Payer: Self-pay | Admitting: Internal Medicine

## 2022-05-18 DIAGNOSIS — M79674 Pain in right toe(s): Secondary | ICD-10-CM

## 2022-05-18 DIAGNOSIS — L84 Corns and callosities: Secondary | ICD-10-CM

## 2022-05-18 DIAGNOSIS — B351 Tinea unguium: Secondary | ICD-10-CM | POA: Diagnosis not present

## 2022-05-18 DIAGNOSIS — M79675 Pain in left toe(s): Secondary | ICD-10-CM | POA: Diagnosis not present

## 2022-05-18 DIAGNOSIS — R7303 Prediabetes: Secondary | ICD-10-CM

## 2022-05-18 NOTE — Telephone Encounter (Signed)
LMOM w/ verbal orders.  

## 2022-05-18 NOTE — Progress Notes (Signed)
  Subjective:  Patient ID: Mandy Durham, female    DOB: 03-12-1946,   MRN: [id number]  Chief Complaint  Patient presents with   Nail Problem    Thick painful toenails, 4 month follow up    77 y.o. female presents for concern of thickened elongated and painful nails that are difficult to trim. Requesting to have them trimmed today. Relates burning and tingling in their feet. Patient is diabetic and last A1c was  Lab Results  Component Value Date   HGBA1C 6.6 (H) 01/28/2022   .   PCP:  Colon Branch, MD    . Denies any other pedal complaints. Denies n/v/f/c.   Past Medical History:  Diagnosis Date   Anxiety state, unspecified    Hyperlipidemia    Hypertension    Insomnia    LBBB (left bundle branch block)    LHC in 2002 showed normal coronaries.    Osteoarthrosis, unspecified whether generalized or localized, unspecified site    Overweight(278.02)    Parkinson disease    S/P placement of cardiac pacemaker    a. 02/21/16: Medtronic Advisa DR MRI SureScan model [id number] (serial number [id number])    Serrated adenoma of colon 2007   Symptomatic menopausal or female climacteric states    Syncope and collapse    11/12: Holter 12/12 with rare PACS, HR range 64-140, average 82, no significant arrhythmias. Echo (1/13): EF 50-55%, mild LVH, septal-lateral dyssynchrony c/w LBBB. 3-week event monitor (1/13): No significant arrhythmia.     Objective:  Physical Exam: Vascular: DP/PT pulses 2/4 bilateral. CFT <3 seconds. Absent hair growth on digits. Edema noted to bilateral lower extremities. Xerosis noted bilaterally.  Skin. No lacerations or abrasions bilateral feet. Nails 1-5 bilateral  are thickened discolored and elongated with subungual debris.  Musculoskeletal: MMT 5/5 bilateral lower extremities in DF, PF, Inversion and Eversion. Deceased ROM in DF of ankle joint.  Neurological: Sensation intact to light touch. Protective sensation diminished bilateral.    Assessment:   1. Pain  due to onychomycosis of toenails of both feet   2. Corns and callosities   3. Borderline diabetes      Plan:  Patient was evaluated and treated and all questions answered. -Discussed and educated patient on diabetic foot care, especially with  regards to the vascular, neurological and musculoskeletal systems.  -Stressed the importance of good glycemic control and the detriment of not  controlling glucose levels in relation to the foot. -Discussed supportive shoes at all times and checking feet regularly.  -Mechanically debrided all nails 1-5 bilateral using sterile nail nipper and filed with dremel without incident  -Answered all patient questions -Patient to return  in 3 months for at risk foot care -Patient advised to call the office if any problems or questions arise in the meantime.   Lorenda Peck, DPM

## 2022-05-18 NOTE — Telephone Encounter (Signed)
Caller/Agency: [place] Number: [phone] - ok vm Requesting OT/PT/Skilled Nursing/Social Work/Speech Therapy:  Frequency:  Nursing 1 w 5  Home health aide 1 w 4  Pt coming in tomorrow and husband needs med list for the nurse.

## 2022-05-19 ENCOUNTER — Telehealth: Payer: Self-pay

## 2022-05-19 ENCOUNTER — Ambulatory Visit (INDEPENDENT_AMBULATORY_CARE_PROVIDER_SITE_OTHER): Payer: PPO | Admitting: Internal Medicine

## 2022-05-19 VITALS — BP 112/60 | HR 70 | Temp 98.0°F | Resp 20 | Ht 65.0 in | Wt 107.0 lb

## 2022-05-19 DIAGNOSIS — G214 Vascular parkinsonism: Secondary | ICD-10-CM

## 2022-05-19 DIAGNOSIS — F419 Anxiety disorder, unspecified: Secondary | ICD-10-CM | POA: Diagnosis not present

## 2022-05-19 DIAGNOSIS — R627 Adult failure to thrive: Secondary | ICD-10-CM

## 2022-05-19 DIAGNOSIS — Z91199 Patient's noncompliance with other medical treatment and regimen due to unspecified reason: Secondary | ICD-10-CM

## 2022-05-19 NOTE — Patient Instructions (Addendum)
Take the medications according to the list provided  You need to buy an aspirin 81 mg and take 1 every day  We sent prescriptions for clonazepam and escitalopram.  As soon as you get them bring them so we can put your pills in the envelopes.    Next visit next week

## 2022-05-19 NOTE — Progress Notes (Signed)
Subjective:    Patient ID: Mandy Durham, female    DOB: [birth year], 77 y.o.   MRN: [id number]  DOS:  05/19/2022 Type of visit - description: Hospital follow-up  Patient was admitted to the hospital 03/18/2022, discharge 03/26/2022 to SNF.  Admitted with weakness, poor appetite, found to have COVID-19  but no pneumonia or respiratory symptoms.  Paxlovid and supportive care provided. Unfortunately she was very weak and had to be discharged to SNF.  Since she left the SNF is at home with her husband. Has not been taking her medications. Reports appetite is good. Still feeling weak and uses a walker. No recent falls.  Review of Systems See above   Past Medical History:  Diagnosis Date   Anxiety state, unspecified    Hyperlipidemia    Hypertension    Insomnia    LBBB (left bundle branch block)    LHC in 2002 showed normal coronaries.    Osteoarthrosis, unspecified whether generalized or localized, unspecified site    Overweight(278.02)    Parkinson disease    S/P placement of cardiac pacemaker    a. 02/21/16: Medtronic Advisa DR MRI SureScan model [id number] (serial number [id number])    Serrated adenoma of colon 2007   Symptomatic menopausal or female climacteric states    Syncope and collapse    11/12: Holter 12/12 with rare PACS, HR range 64-140, average 82, no significant arrhythmias. Echo (1/13): EF 50-55%, mild LVH, septal-lateral dyssynchrony c/w LBBB. 3-week event monitor (1/13): No significant arrhythmia.     Past Surgical History:  Procedure Laterality Date   COLONOSCOPY  2007   EP IMPLANTABLE DEVICE N/A 02/21/2016   Procedure: Pacemaker Implant;  Surgeon: Will Meredith Leeds, MD;  Location: [facility];  Service: Cardiovascular;  Laterality: N/A;   POLYPECTOMY      Current Outpatient Medications  Medication Instructions   acetaminophen (TYLENOL) 500 mg, Oral, Every 6 hours PRN   albuterol (PROVENTIL) 2.5 mg, Nebulization, Every 2 hours PRN   aspirin EC 81  mg, Oral, Daily, Swallow whole.    baclofen (LIORESAL) 10 mg, Oral, 2 times daily PRN   Budeson-Glycopyrrol-Formoterol (BREZTRI AEROSPHERE) 160-9-4.8 MCG/ACT AERO 2 puffs, Inhalation, 2 times daily   carbidopa-levodopa (SINEMET IR) 25-100 MG tablet Take 1 tablet by mouth 4 times daily at 8am/noon/4pm/8pm   Cholecalciferol (VITAMIN D PO) 1 tablet, Oral, 2 times weekly, Vitamin D   clonazePAM (KLONOPIN) 0.25 mg, Oral, 2 times daily PRN   escitalopram (LEXAPRO) 10 mg, Oral, Daily   ezetimibe (ZETIA) 10 mg, Oral, Daily   lactose free nutrition (BOOST PLUS) LIQD 237 mLs, Oral, 2 times daily between meals   losartan (COZAAR) 100 mg, Oral, Daily   nitroGLYCERIN (NITROSTAT) 0.4 mg, Sublingual, Every 5 min x3 PRN       Objective:   Physical Exam BP 112/60   Pulse 70   Temp 98 F (36.7 C)   Resp 20   Ht '5\' 5"'$  (1.651 m)   Wt 107 lb (48.5 kg)   SpO2 99%   BMI 17.81 kg/m  General:   Well developed, NAD, BMI noted. HEENT:  Normocephalic . Face symmetric, atraumatic Lungs:  CTA B Normal respiratory effort, no intercostal retractions, no accessory muscle use. Heart: RRR,  no murmur.  Lower extremities: no pretibial edema bilaterally  Skin: Not pale. Not jaundice Neurologic:  alert & oriented X3.  Speech normal, gait uses a walker appropriately  psych--  Behavior appropriate. No anxious or depressed appearing.  Assessment     Assessment Prediabetes  HTN : amlodipine: edema 10/2018 Hyperlipidemia, statin intolerant Anxiety, insomnia   Menopausal; DEXA 2012 -1.5 @ gyn Recurrent Syncope --3:1 AV Block pacemaker 02/21/16 --   DX with autonomic insufficiency ~ 02-2015 >>>on a  abd binder , pyridostigmine, rx midodrine 06-2015 per cards  --CT coronary morphology with CTA and coronary score 01/12/2018: No significant disease - Diastolic CHF Traumatic SAH (after a syncope),TBI--- admitted 01-2015  -- was rx seroquel (aparently for ICU delirium) -- d/c 07-2015 --rx topamax when at  rehab for post [facility]  HAs -- had issues w/ b/b incontinence, better as of 07-2015 Tremors LUE, onset 9 -2017 Osteopneia: on Boniva, rx elsewhere DOE/CP: Low risk Myoview, echo done 10/20/2019, saw cards 05/2020 and pulmonary 07/2020: multifactorial, PFTx ordered   PLAN Compliance: Since the last last visit, was admitted to the hospital and subsequently to a SNF.  Went back home last week. She lives with her husband, prior to the admission Mandy Durham was able to take care of of her medications and her husband's  medications. At this point she is not, her husband tells me that they are both very confused about the medications and they have not been taking them I review the medication list from when she left the hospital and she is was supposed to be on: Tylenol, albuterol, aspirin, baclofen, Breztri, carbidopa levodopa, clonazepam, escitalopram, acetaminophen, losartan, NTG as needed, and vitamin D. I reviewed the medication list from   prior to the admission to the hospital and the following medications have been stopped: Amantadine, buspirone, Lasix and potassium.  Together with the CMA, we arranged her medicines, dispose of empty bottles, sent refills for: Clonazepam, escitalopram.  The medications she is not taking (but she has a supply of) were placed in a bag and advised not to use. Chronic medical problems: See above Social: I had a long conversation with the patient's daughter last week regards the care of Mandy Durham and Mandy Durham, her husband, who is also my patient.  Mandy Durham tells me  tell me that the daughters have not show up at home to help them. RTC 5 days  Time spent 39 minutes.  My staff spent more than an hour with Mandy Durham and her husband.

## 2022-05-19 NOTE — Assessment & Plan Note (Signed)
Compliance: Since the last last visit, was admitted to the hospital and subsequently to a SNF.  Went back home last week. She lives with her husband, prior to the admission Mandy Durham was able to take care of of her medications and her husband's  medications. At this point she is not, her husband tells me that they are both very confused about the medications and they have not been taking them I review the medication list from when she left the hospital and she is was supposed to be on: Tylenol, albuterol, aspirin, baclofen, Breztri, carbidopa levodopa, clonazepam, escitalopram, acetaminophen, losartan, NTG as needed, and vitamin D. I reviewed the medication list from   prior to the admission to the hospital and the following medications have been stopped: Amantadine, buspirone, Lasix and potassium.  Together with the CMA, we arranged her medicines, dispose of empty bottles, sent refills for: Clonazepam, escitalopram.  The medications she is not taking (but she has a supply of) were placed in a bag and advised not to use. Chronic medical problems: See above Social: I had a long conversation with the patient's daughter last week regards the care of Mandy Durham and Mandy Durham, her husband, who is also my patient.  Mandy Durham tells me  tell me that the daughters have not show up at home to help them. RTC 5 days

## 2022-05-20 MED ORDER — ESCITALOPRAM OXALATE 10 MG PO TABS: 10.0000 mg | ORAL_TABLET | Freq: Every day | ORAL | 1 refills | Status: AC

## 2022-05-20 MED ORDER — CLONAZEPAM 0.25 MG PO TBDP: 0.2500 mg | ORAL_TABLET | Freq: Two times a day (BID) | ORAL | 0 refills | Status: DC | PRN

## 2022-05-20 NOTE — Telephone Encounter (Signed)
Refill request for clonazepam 0.'25mg'$ , unsure when last refill date was.

## 2022-05-20 NOTE — Telephone Encounter (Signed)
PDMP okay, prescription sent 

## 2022-05-22 ENCOUNTER — Telehealth: Payer: Self-pay | Admitting: Internal Medicine

## 2022-05-22 NOTE — Telephone Encounter (Signed)
Mandy Durham with [facility] requesting an order for Urinanalysis and culture sensitivity test. Please call [phone]  to advise.

## 2022-05-22 NOTE — Telephone Encounter (Signed)
Discussed w/ PCP- okay to check UA and urine culture. Pt will need to go to UC if fever or severe sx's. I spoke w/ Candis Schatz- informed of PCP recommendations. Pt is not having fever or severe sx's.

## 2022-05-24 ENCOUNTER — Other Ambulatory Visit: Payer: Self-pay

## 2022-05-24 ENCOUNTER — Inpatient Hospital Stay (HOSPITAL_COMMUNITY)
Admission: EM | Admit: 2022-05-24 | Discharge: 2022-05-29 | DRG: 092 | Disposition: A | Discharge status: Skilled Nursing Facility | Payer: PPO | Attending: Internal Medicine | Admitting: Internal Medicine

## 2022-05-24 ENCOUNTER — Emergency Department (HOSPITAL_COMMUNITY): Payer: PPO

## 2022-05-24 ENCOUNTER — Encounter (HOSPITAL_COMMUNITY): Payer: Self-pay | Admitting: *Deleted

## 2022-05-24 DIAGNOSIS — G20A1 Parkinson's disease without dyskinesia, without mention of fluctuations: Secondary | ICD-10-CM | POA: Diagnosis not present

## 2022-05-24 DIAGNOSIS — R29818 Other symptoms and signs involving the nervous system: Secondary | ICD-10-CM | POA: Diagnosis not present

## 2022-05-24 DIAGNOSIS — I5032 Chronic diastolic (congestive) heart failure: Secondary | ICD-10-CM | POA: Diagnosis not present

## 2022-05-24 DIAGNOSIS — R7303 Prediabetes: Secondary | ICD-10-CM | POA: Diagnosis present

## 2022-05-24 DIAGNOSIS — Z87820 Personal history of traumatic brain injury: Secondary | ICD-10-CM

## 2022-05-24 DIAGNOSIS — I495 Sick sinus syndrome: Secondary | ICD-10-CM | POA: Diagnosis not present

## 2022-05-24 DIAGNOSIS — Z1152 Encounter for screening for COVID-19: Secondary | ICD-10-CM

## 2022-05-24 DIAGNOSIS — G9341 Metabolic encephalopathy: Secondary | ICD-10-CM | POA: Diagnosis not present

## 2022-05-24 DIAGNOSIS — G928 Other toxic encephalopathy: Principal | ICD-10-CM | POA: Diagnosis present

## 2022-05-24 DIAGNOSIS — Z8249 Family history of ischemic heart disease and other diseases of the circulatory system: Secondary | ICD-10-CM

## 2022-05-24 DIAGNOSIS — M6281 Muscle weakness (generalized): Secondary | ICD-10-CM | POA: Diagnosis not present

## 2022-05-24 DIAGNOSIS — F0284 Dementia in other diseases classified elsewhere, unspecified severity, with anxiety: Secondary | ICD-10-CM | POA: Diagnosis not present

## 2022-05-24 DIAGNOSIS — E860 Dehydration: Secondary | ICD-10-CM | POA: Diagnosis present

## 2022-05-24 DIAGNOSIS — I5030 Unspecified diastolic (congestive) heart failure: Secondary | ICD-10-CM | POA: Diagnosis present

## 2022-05-24 DIAGNOSIS — Z7401 Bed confinement status: Secondary | ICD-10-CM | POA: Diagnosis not present

## 2022-05-24 DIAGNOSIS — F29 Unspecified psychosis not due to a substance or known physiological condition: Secondary | ICD-10-CM | POA: Diagnosis not present

## 2022-05-24 DIAGNOSIS — I447 Left bundle-branch block, unspecified: Secondary | ICD-10-CM | POA: Diagnosis not present

## 2022-05-24 DIAGNOSIS — T424X5A Adverse effect of benzodiazepines, initial encounter: Secondary | ICD-10-CM | POA: Diagnosis present

## 2022-05-24 DIAGNOSIS — I951 Orthostatic hypotension: Secondary | ICD-10-CM | POA: Diagnosis present

## 2022-05-24 DIAGNOSIS — R197 Diarrhea, unspecified: Secondary | ICD-10-CM | POA: Diagnosis not present

## 2022-05-24 DIAGNOSIS — G20B1 Parkinson's disease with dyskinesia, without mention of fluctuations: Secondary | ICD-10-CM | POA: Diagnosis not present

## 2022-05-24 DIAGNOSIS — Z79899 Other long term (current) drug therapy: Secondary | ICD-10-CM

## 2022-05-24 DIAGNOSIS — G909 Disorder of the autonomic nervous system, unspecified: Secondary | ICD-10-CM | POA: Diagnosis present

## 2022-05-24 DIAGNOSIS — R627 Adult failure to thrive: Secondary | ICD-10-CM | POA: Diagnosis present

## 2022-05-24 DIAGNOSIS — I472 Ventricular tachycardia, unspecified: Secondary | ICD-10-CM | POA: Diagnosis not present

## 2022-05-24 DIAGNOSIS — R471 Dysarthria and anarthria: Secondary | ICD-10-CM | POA: Diagnosis present

## 2022-05-24 DIAGNOSIS — Z681 Body mass index (BMI) 19 or less, adult: Secondary | ICD-10-CM | POA: Diagnosis not present

## 2022-05-24 DIAGNOSIS — G4700 Insomnia, unspecified: Secondary | ICD-10-CM | POA: Diagnosis not present

## 2022-05-24 DIAGNOSIS — G9340 Encephalopathy, unspecified: Secondary | ICD-10-CM | POA: Diagnosis not present

## 2022-05-24 DIAGNOSIS — R278 Other lack of coordination: Secondary | ICD-10-CM | POA: Diagnosis not present

## 2022-05-24 DIAGNOSIS — I110 Hypertensive heart disease with heart failure: Secondary | ICD-10-CM | POA: Diagnosis not present

## 2022-05-24 DIAGNOSIS — T428X5A Adverse effect of antiparkinsonism drugs and other central muscle-tone depressants, initial encounter: Secondary | ICD-10-CM | POA: Diagnosis present

## 2022-05-24 DIAGNOSIS — Z91130 Patient's unintentional underdosing of medication regimen due to age-related debility: Secondary | ICD-10-CM

## 2022-05-24 DIAGNOSIS — I10 Essential (primary) hypertension: Secondary | ICD-10-CM | POA: Diagnosis not present

## 2022-05-24 DIAGNOSIS — Z86010 Personal history of colonic polyps: Secondary | ICD-10-CM

## 2022-05-24 DIAGNOSIS — R32 Unspecified urinary incontinence: Secondary | ICD-10-CM | POA: Diagnosis present

## 2022-05-24 DIAGNOSIS — F32A Depression, unspecified: Secondary | ICD-10-CM | POA: Diagnosis present

## 2022-05-24 DIAGNOSIS — Z950 Presence of cardiac pacemaker: Secondary | ICD-10-CM

## 2022-05-24 DIAGNOSIS — E785 Hyperlipidemia, unspecified: Secondary | ICD-10-CM | POA: Diagnosis present

## 2022-05-24 DIAGNOSIS — E440 Moderate protein-calorie malnutrition: Secondary | ICD-10-CM | POA: Diagnosis present

## 2022-05-24 DIAGNOSIS — Z833 Family history of diabetes mellitus: Secondary | ICD-10-CM

## 2022-05-24 DIAGNOSIS — Z7982 Long term (current) use of aspirin: Secondary | ICD-10-CM | POA: Diagnosis not present

## 2022-05-24 DIAGNOSIS — R4182 Altered mental status, unspecified: Principal | ICD-10-CM

## 2022-05-24 DIAGNOSIS — R5381 Other malaise: Secondary | ICD-10-CM | POA: Diagnosis present

## 2022-05-24 DIAGNOSIS — R9431 Abnormal electrocardiogram [ECG] [EKG]: Secondary | ICD-10-CM | POA: Diagnosis not present

## 2022-05-24 DIAGNOSIS — F419 Anxiety disorder, unspecified: Secondary | ICD-10-CM | POA: Diagnosis present

## 2022-05-24 DIAGNOSIS — G20C Parkinsonism, unspecified: Secondary | ICD-10-CM | POA: Diagnosis present

## 2022-05-24 DIAGNOSIS — E861 Hypovolemia: Secondary | ICD-10-CM | POA: Diagnosis not present

## 2022-05-24 DIAGNOSIS — F0283 Dementia in other diseases classified elsewhere, unspecified severity, with mood disturbance: Secondary | ICD-10-CM | POA: Diagnosis not present

## 2022-05-24 DIAGNOSIS — E663 Overweight: Secondary | ICD-10-CM | POA: Diagnosis not present

## 2022-05-24 DIAGNOSIS — F411 Generalized anxiety disorder: Secondary | ICD-10-CM | POA: Diagnosis not present

## 2022-05-24 DIAGNOSIS — Z8616 Personal history of COVID-19: Secondary | ICD-10-CM

## 2022-05-24 DIAGNOSIS — Z7951 Long term (current) use of inhaled steroids: Secondary | ICD-10-CM

## 2022-05-24 DIAGNOSIS — F8081 Childhood onset fluency disorder: Secondary | ICD-10-CM | POA: Diagnosis not present

## 2022-05-24 HISTORY — DX: Ventricular tachycardia, unspecified: I47.20

## 2022-05-24 LAB — COMPREHENSIVE METABOLIC PANEL
ALT: 14 U/L (ref 0–44)
AST: 23 U/L (ref 15–41)
Albumin: 3.5 g/dL (ref 3.5–5.0)
Alkaline Phosphatase: 64 U/L (ref 38–126)
Anion gap: 12 (ref 5–15)
BUN: 28 mg/dL — ABNORMAL HIGH (ref 8–23)
CO2: 22 mmol/L (ref 22–32)
Calcium: 9.2 mg/dL (ref 8.9–10.3)
Chloride: 106 mmol/L (ref 98–111)
Creatinine, Ser: 0.84 mg/dL (ref 0.44–1.00)
GFR, Estimated: >60 mL/min (ref >60)
Glucose, Bld: 106 mg/dL — ABNORMAL HIGH (ref 70–99)
Potassium: 3.7 mmol/L (ref 3.5–5.1)
Sodium: 140 mmol/L (ref 135–145)
Total Bilirubin: 0.8 mg/dL (ref 0.3–1.2)
Total Protein: 6.4 g/dL — ABNORMAL LOW (ref 6.5–8.1)

## 2022-05-24 LAB — RESP PANEL BY RT-PCR (RSV, FLU A&B, COVID)  RVPGX2
Influenza A by PCR: NEGATIVE
Influenza B by PCR: NEGATIVE
Resp Syncytial Virus by PCR: NEGATIVE
SARS Coronavirus 2 by RT PCR: NEGATIVE

## 2022-05-24 LAB — LACTIC ACID, PLASMA: Lactic Acid, Venous: 1 mmol/L (ref 0.5–1.9)

## 2022-05-24 LAB — URINALYSIS, W/ REFLEX TO CULTURE (INFECTION SUSPECTED)
Glucose, UA: NEGATIVE mg/dL
Hgb urine dipstick: NEGATIVE
Ketones, ur: NEGATIVE mg/dL
Leukocytes,Ua: NEGATIVE
Nitrite: NEGATIVE
Protein, ur: NEGATIVE mg/dL
Specific Gravity, Urine: >1.03 — ABNORMAL HIGH (ref 1.005–1.030)
pH: 7 (ref 5.0–8.0)

## 2022-05-24 LAB — CBC WITH DIFFERENTIAL/PLATELET
Abs Immature Granulocytes: 0.03 10*3/uL (ref 0.00–0.07)
Basophils Absolute: 0 10*3/uL (ref 0.0–0.1)
Basophils Relative: 1 %
Eosinophils Absolute: 0.1 10*3/uL (ref 0.0–0.5)
Eosinophils Relative: 1 %
HCT: 40.8 % (ref 36.0–46.0)
Hemoglobin: 13.3 g/dL (ref 12.0–15.0)
Immature Granulocytes: 1 %
Lymphocytes Relative: 26 %
Lymphs Abs: 1.6 10*3/uL (ref 0.7–4.0)
MCH: 28.5 pg (ref 26.0–34.0)
MCHC: 32.6 g/dL (ref 30.0–36.0)
MCV: 87.4 fL (ref 80.0–100.0)
Monocytes Absolute: 0.5 10*3/uL (ref 0.1–1.0)
Monocytes Relative: 9 %
Neutro Abs: 3.9 10*3/uL (ref 1.7–7.7)
Neutrophils Relative %: 62 %
Platelets: 297 10*3/uL (ref 150–400)
RBC: 4.67 MIL/uL (ref 3.87–5.11)
RDW: 15.2 % (ref 11.5–15.5)
WBC: 6.1 10*3/uL (ref 4.0–10.5)
nRBC: 0 % (ref 0.0–0.2)

## 2022-05-24 LAB — PROTIME-INR
INR: 1.1 (ref 0.8–1.2)
Prothrombin Time: 13.8 seconds (ref 11.4–15.2)

## 2022-05-24 LAB — AMMONIA: Ammonia: 25 umol/L (ref 9–35)

## 2022-05-24 LAB — APTT: aPTT: 25 seconds (ref 24–36)

## 2022-05-24 MED ORDER — BUDESONIDE 180 MCG/ACT IN AEPB: 1.0000 | INHALATION_SPRAY | Freq: Two times a day (BID) | RESPIRATORY_TRACT | Status: DC

## 2022-05-24 MED ORDER — UMECLIDINIUM BROMIDE 62.5 MCG/ACT IN AEPB
1.0000 | INHALATION_SPRAY | Freq: Every day | RESPIRATORY_TRACT | Status: DC
  Filled 2022-05-24 (×2): qty 7

## 2022-05-24 MED ORDER — FLUTICASONE FUROATE-VILANTEROL 200-25 MCG/ACT IN AEPB
1.0000 | INHALATION_SPRAY | Freq: Every day | RESPIRATORY_TRACT | Status: DC
  Filled 2022-05-24 (×2): qty 28

## 2022-05-24 MED ORDER — GLUCERNA SHAKE PO LIQD
237.0000 mL | Freq: Three times a day (TID) | ORAL | Status: DC
  Administered 2022-05-25 – 2022-05-26 (×3): 237 mL via ORAL
  Filled 2022-05-24: qty 237

## 2022-05-24 MED ORDER — ACETAMINOPHEN 500 MG PO TABS
500.0000 mg | ORAL_TABLET | Freq: Four times a day (QID) | ORAL | Status: DC | PRN
  Filled 2022-05-24: qty 1

## 2022-05-24 MED ORDER — ESCITALOPRAM OXALATE 10 MG PO TABS
10.0000 mg | ORAL_TABLET | Freq: Every day | ORAL | Status: DC
  Administered 2022-05-24 – 2022-05-29 (×6): 10 mg via ORAL
  Filled 2022-05-24 (×6): qty 1

## 2022-05-24 MED ORDER — BUDESON-GLYCOPYRROL-FORMOTEROL 160-9-4.8 MCG/ACT IN AERO: 2.0000 | INHALATION_SPRAY | Freq: Two times a day (BID) | RESPIRATORY_TRACT | Status: DC

## 2022-05-24 MED ORDER — LOSARTAN POTASSIUM 50 MG PO TABS
100.0000 mg | ORAL_TABLET | Freq: Every day | ORAL | Status: DC
  Administered 2022-05-24 – 2022-05-27 (×4): 100 mg via ORAL
  Filled 2022-05-24 (×4): qty 2

## 2022-05-24 MED ORDER — VITAMIN D 25 MCG (1000 UNIT) PO TABS
1000.0000 [IU] | ORAL_TABLET | ORAL | Status: DC
  Administered 2022-05-25 – 2022-05-28 (×2): 1000 [IU] via ORAL
  Filled 2022-05-24 (×2): qty 1

## 2022-05-24 MED ORDER — SODIUM CHLORIDE 0.45 % IV SOLN: INTRAVENOUS | Status: AC

## 2022-05-24 MED ORDER — EZETIMIBE 10 MG PO TABS
10.0000 mg | ORAL_TABLET | Freq: Every day | ORAL | Status: DC
  Administered 2022-05-24 – 2022-05-29 (×6): 10 mg via ORAL
  Filled 2022-05-24 (×6): qty 1

## 2022-05-24 MED ORDER — UMECLIDINIUM-VILANTEROL 62.5-25 MCG/ACT IN AEPB: 1.0000 | INHALATION_SPRAY | Freq: Every day | RESPIRATORY_TRACT | Status: DC

## 2022-05-24 MED ORDER — NITROGLYCERIN 0.4 MG SL SUBL: 0.4000 mg | SUBLINGUAL_TABLET | SUBLINGUAL | Status: DC | PRN

## 2022-05-24 MED ORDER — ENOXAPARIN SODIUM 30 MG/0.3ML IJ SOSY
30.0000 mg | PREFILLED_SYRINGE | INTRAMUSCULAR | Status: DC
  Administered 2022-05-24: 30 mg via SUBCUTANEOUS
  Filled 2022-05-24: qty 0.3

## 2022-05-24 MED ORDER — ASPIRIN 81 MG PO TBEC
81.0000 mg | DELAYED_RELEASE_TABLET | Freq: Every day | ORAL | Status: DC
  Administered 2022-05-24 – 2022-05-29 (×6): 81 mg via ORAL
  Filled 2022-05-24 (×6): qty 1

## 2022-05-24 MED ORDER — BACLOFEN 10 MG PO TABS
10.0000 mg | ORAL_TABLET | Freq: Two times a day (BID) | ORAL | Status: DC | PRN
  Administered 2022-05-24: 10 mg via ORAL
  Filled 2022-05-24: qty 1

## 2022-05-24 MED ORDER — ALBUTEROL SULFATE (2.5 MG/3ML) 0.083% IN NEBU: 2.5000 mg | INHALATION_SOLUTION | RESPIRATORY_TRACT | Status: DC | PRN

## 2022-05-24 MED ORDER — CARBIDOPA-LEVODOPA 25-100 MG PO TABS
1.0000 | ORAL_TABLET | Freq: Four times a day (QID) | ORAL | Status: DC
  Administered 2022-05-24 – 2022-05-29 (×19): 1 via ORAL
  Filled 2022-05-24 (×19): qty 1

## 2022-05-24 MED ORDER — CLONAZEPAM 0.25 MG PO TBDP
0.2500 mg | ORAL_TABLET | Freq: Two times a day (BID) | ORAL | Status: DC | PRN
  Administered 2022-05-24 (×2): 0.25 mg via ORAL
  Filled 2022-05-24 (×3): qty 2

## 2022-05-24 MED ORDER — BOOST PLUS PO LIQD: 237.0000 mL | Freq: Two times a day (BID) | ORAL | Status: DC

## 2022-05-24 NOTE — ED Notes (Signed)
Patient transported to CT 

## 2022-05-24 NOTE — ED Notes (Signed)
Patient repositioned in bed.

## 2022-05-24 NOTE — ED Notes (Signed)
Pt has purewick 

## 2022-05-24 NOTE — ED Notes (Signed)
ED TO INPATIENT HANDOFF REPORT  ED Nurse Name and Phone #: [phone]  S Name/Age/Gender Mandy Durham 76 y.o. female Room/Bed: 023C/023C  Code Status   Code Status: Full Code  Home/SNF/Other Home Patient oriented to: self Is this baseline? No   Triage Complete: Triage complete  Chief Complaint Encephalopathy, metabolic 99991111  Triage Note Patient prsents to ed vial [facility] from home of which she lives with her husband. He states she wasn't acting her normal self today, Strong odor of urine she was also incont of which she states is new for her.    Allergies No Known Allergies  Level of Care/Admitting Diagnosis ED Disposition     ED Disposition  Admit   Condition  --   Comment  Hospital Area: [place] [[id number]]  Level of Care: Med-Surg [[id number]]  May admit patient to [facility] or [facility] if equivalent level of care is available:: No  Covid Evaluation: Asymptomatic - no recent exposure (last 10 days) testing not required  Diagnosis: Encephalopathy, metabolic 123456  Admitting Physician: Neena Rhymes [[id number]]  Attending Physician: Neena Rhymes [id number]  Certification:: I certify this patient will need inpatient services for at least 2 midnights  Estimated Length of Stay: 3          B Medical/Surgery History Past Medical History:  Diagnosis Date   Anxiety state, unspecified    Hyperlipidemia    Hypertension    Insomnia    LBBB (left bundle branch block)    LHC in 2002 showed normal coronaries.    Osteoarthrosis, unspecified whether generalized or localized, unspecified site    Overweight(278.02)    Parkinson disease    S/P placement of cardiac pacemaker    a. 02/21/16: Medtronic Advisa DR MRI SureScan model [id number] (serial number [id number])    Serrated adenoma of colon 2007   Symptomatic menopausal or female climacteric states    Syncope and collapse    11/12: Holter 12/12 with rare PACS, HR range 64-140, average 82, no  significant arrhythmias. Echo (1/13): EF 50-55%, mild LVH, septal-lateral dyssynchrony c/w LBBB. 3-week event monitor (1/13): No significant arrhythmia.    Ventricular tachycardia ([place]) 05/24/2022   Patient with 3:1 AVB with pacemaker. She is noted to be tachycardic on telemetry but is asymptomatic  Plan   Past Surgical History:  Procedure Laterality Date   COLONOSCOPY  2007   EP IMPLANTABLE DEVICE N/A 02/21/2016   Procedure: Pacemaker Implant;  Surgeon: Will Meredith Leeds, MD;  Location: [facility];  Service: Cardiovascular;  Laterality: N/A;   POLYPECTOMY       A IV Location/Drains/Wounds Patient Lines/Drains/Airways Status     Active Line/Drains/Airways     Name Placement date Placement time Site Days   Peripheral IV 05/24/22 22 G 1" Anterior;Proximal;Right Forearm 05/24/22  1034  Forearm  less than 1            Intake/Output Last 24 hours No intake or output data in the 24 hours ending 05/24/22 1848  Labs/Imaging Results for orders placed or performed during the hospital encounter of 05/24/22 (from the past 48 hour(s))  Lactic acid, plasma     Status: None   Collection Time: 05/24/22 10:26 AM  Result Value Ref Range   Lactic Acid, Venous 1.0 0.5 - 1.9 mmol/L    Comment: Performed at [facility], [address], [place], [place] [address]  Comprehensive metabolic panel     Status: Abnormal   Collection  Time: 05/24/22 10:26 AM  Result Value Ref Range   Sodium 140 135 - 145 mmol/L   Potassium 3.7 3.5 - 5.1 mmol/L   Chloride 106 98 - 111 mmol/L   CO2 22 22 - 32 mmol/L   Glucose, Bld 106 (H) 70 - 99 mg/dL    Comment: Glucose reference range applies only to samples taken after fasting for at least 8 hours.   BUN 28 (H) 8 - 23 mg/dL   Creatinine, Ser 0.84 0.44 - 1.00 mg/dL   Calcium 9.2 8.9 - 10.3 mg/dL   Total Protein 6.4 (L) 6.5 - 8.1 g/dL   Albumin 3.5 3.5 - 5.0 g/dL   AST 23 15 - 41 U/L   ALT 14 0 - 44 U/L   Alkaline Phosphatase 64 38 - 126 U/L    Total Bilirubin 0.8 0.3 - 1.2 mg/dL   GFR, Estimated >60 >60 mL/min    Comment: (NOTE) Calculated using the CKD-EPI Creatinine Equation (2021)    Anion gap 12 5 - 15    Comment: Performed at [facility] [address], [place], [place] [address]  CBC with Differential     Status: None   Collection Time: 05/24/22 10:26 AM  Result Value Ref Range   WBC 6.1 4.0 - 10.5 K/uL   RBC 4.67 3.87 - 5.11 MIL/uL   Hemoglobin 13.3 12.0 - 15.0 g/dL   HCT 40.8 36.0 - 46.0 %   MCV 87.4 80.0 - 100.0 fL   MCH 28.5 26.0 - 34.0 pg   MCHC 32.6 30.0 - 36.0 g/dL   RDW 15.2 11.5 - 15.5 %   Platelets 297 150 - 400 K/uL   nRBC 0.0 0.0 - 0.2 %   Neutrophils Relative % 62 %   Neutro Abs 3.9 1.7 - 7.7 K/uL   Lymphocytes Relative 26 %   Lymphs Abs 1.6 0.7 - 4.0 K/uL   Monocytes Relative 9 %   Monocytes Absolute 0.5 0.1 - 1.0 K/uL   Eosinophils Relative 1 %   Eosinophils Absolute 0.1 0.0 - 0.5 K/uL   Basophils Relative 1 %   Basophils Absolute 0.0 0.0 - 0.1 K/uL   Immature Granulocytes 1 %   Abs Immature Granulocytes 0.03 0.00 - 0.07 K/uL    Comment: Performed at [facility], [address], [place], [place] [address]  Urinalysis, w/ Reflex to Culture (Infection Suspected) -Urine, Clean Catch     Status: Abnormal   Collection Time: 05/24/22 10:31 AM  Result Value Ref Range   Specimen Source URINE, CLEAN CATCH    Color, Urine YELLOW YELLOW   APPearance CLEAR CLEAR   Specific Gravity, Urine >1.030 (H) 1.005 - 1.030   pH 7.0 5.0 - 8.0   Glucose, UA NEGATIVE NEGATIVE mg/dL   Hgb urine dipstick NEGATIVE NEGATIVE   Bilirubin Urine SMALL (A) NEGATIVE   Ketones, ur NEGATIVE NEGATIVE mg/dL   Protein, ur NEGATIVE NEGATIVE mg/dL   Nitrite NEGATIVE NEGATIVE   Leukocytes,Ua NEGATIVE NEGATIVE   Squamous Epithelial / HPF 0-5 0 - 5 /HPF   WBC, UA 0-5 0 - 5 WBC/hpf    Comment: Reflex urine culture not performed if WBC <=10, OR if Squamous epithelial cells >5. If Squamous epithelial cells >5,  suggest recollection.   RBC / HPF 0-5 0 - 5 RBC/hpf   Bacteria, UA RARE (A) NONE SEEN   Urine-Other MICROSCOPIC EXAM PERFORMED ON UNCONCENTRATED URINE     Comment: Performed at [facility], [address] [place], [place] [address]  Resp panel by RT-PCR (RSV, Flu A&B, Covid) Anterior Nasal Swab     Status: None   Collection Time: 05/24/22 10:32 AM   Specimen: Anterior Nasal Swab  Result Value Ref Range   SARS Coronavirus 2 by RT PCR NEGATIVE NEGATIVE   Influenza A by PCR NEGATIVE NEGATIVE   Influenza B by PCR NEGATIVE NEGATIVE    Comment: (NOTE) The Xpert Xpress SARS-CoV-2/FLU/RSV plus assay is intended as an aid in the diagnosis of influenza from Nasopharyngeal swab specimens and should not be used as a sole basis for treatment. Nasal washings and aspirates are unacceptable for Xpert Xpress SARS-CoV-2/FLU/RSV testing.  Fact Sheet for Patients: EntrepreneurPulse.com.au  Fact Sheet for Healthcare Providers: IncredibleEmployment.be  This test is not yet approved or cleared by the [place] FDA and has been authorized for detection and/or diagnosis of SARS-CoV-2 by FDA under an Emergency Use Authorization (EUA). This EUA will remain in effect (meaning this test can be used) for the duration of the COVID-19 declaration under Section 564(b)(1) of the Act, 21 U.S.C. section 360bbb-3(b)(1), unless the authorization is terminated or revoked.     Resp Syncytial Virus by PCR NEGATIVE NEGATIVE    Comment: (NOTE) Fact Sheet for Patients: EntrepreneurPulse.com.au  Fact Sheet for Healthcare Providers: IncredibleEmployment.be  This test is not yet approved or cleared by the [place] FDA and has been authorized for detection and/or diagnosis of SARS-CoV-2 by FDA under an Emergency Use Authorization (EUA). This EUA will remain in effect (meaning this test can be used) for the duration of the COVID-19  declaration under Section 564(b)(1) of the Act, 21 U.S.C. section 360bbb-3(b)(1), unless the authorization is terminated or revoked.  Performed at [facility], [place] [address] [place], [place] [address]   Protime-INR     Status: None   Collection Time: 05/24/22 10:50 AM  Result Value Ref Range   Prothrombin Time 13.8 11.4 - 15.2 seconds   INR 1.1 0.8 - 1.2    Comment: (NOTE) INR goal varies based on device and disease states. Performed at [facility], [place] [address] [place], [place] [address]   APTT     Status: None   Collection Time: 05/24/22 10:50 AM  Result Value Ref Range   aPTT 25 24 - 36 seconds    Comment: Performed at [facility] [address], [place], [place] [address]  Ammonia     Status: None   Collection Time: 05/24/22  4:00 PM  Result Value Ref Range   Ammonia 25 9 - 35 umol/L    Comment: Performed at [facility], [place] [address] [place], [place] [address]   *Note: Due to a large number of results and/or encounters for the requested time period, some results have not been displayed. A complete set of results can be found in Results Review.   CT HEAD WO CONTRAST (5MM)  Result Date: 05/24/2022 CLINICAL DATA:  77 year old female with altered mental status. 2016 history of fall with right frontotemporal hemorrhagic contusions, intracranial hemorrhage. EXAM: CT HEAD WITHOUT CONTRAST TECHNIQUE: Contiguous axial images were obtained from the base of the skull through the vertex without intravenous contrast. RADIATION DOSE REDUCTION: This exam was performed according to the departmental dose-optimization program which includes automated exposure control, adjustment of the mA and/or kV according to patient size and/or use of iterative reconstruction technique. COMPARISON:  Head CT levin 2817. FINDINGS: Brain: Chronic encephalomalacia in the right inferior frontal  gyrus, right insula and operculum. Mild generalized cerebral volume loss since 2017. No  superimposed No midline shift, ventriculomegaly, mass effect, evidence of mass lesion, intracranial hemorrhage or evidence of cortically based acute infarction. Otherwise mild for age patchy bilateral white matter hypodensity. Vascular: Calcified atherosclerosis at the skull base. No suspicious intracranial vascular hyperdensity. Skull: No acute osseous abnormality identified. Sinuses/Orbits: Minimal new paranasal sinus mucosal thickening since 2017. No sinus fluid levels. Tympanic cavities and mastoids remain clear. Other: Postoperative changes to both globes since the prior CT. No acute orbit or scalp soft tissue finding. IMPRESSION: 1. No acute intracranial abnormality. 2. Stable chronic posttraumatic right frontotemporal encephalomalacia. Mild for age cerebral white matter changes. Electronically Signed   By: Genevie Ann M.D.   On: 05/24/2022 11:59   DG Chest Port 1 View  Result Date: 05/24/2022 CLINICAL DATA:  Questionable sepsis. EXAM: PORTABLE CHEST 1 VIEW COMPARISON:  None Available. FINDINGS: 1052 hours. Lungs are hyperexpanded. The lungs are clear without focal pneumonia, edema, pneumothorax or pleural effusion. The cardiopericardial silhouette is within normal limits for size. The visualized bony structures of the thorax are unremarkable. Telemetry leads overlie the chest. Left permanent pacemaker again noted. IMPRESSION: Hyperexpansion without acute cardiopulmonary findings. Electronically Signed   By: Misty Stanley M.D.   On: 05/24/2022 11:02    Pending Labs Unresulted Labs (From admission, onward)     Start     Ordered   05/31/22 0500  Creatinine, serum  (enoxaparin (LOVENOX)    CrCl >/= 30 ml/min)  Weekly,   R     Comments: while on enoxaparin therapy    05/24/22 1823   05/24/22 1820  Brain natriuretic peptide  Add-on,   AD        05/24/22 1823            Vitals/Pain Today's Vitals   05/24/22 1645 05/24/22 1700 05/24/22 1715 05/24/22 1800  BP: 122/60  (!) 127/56 (!) 145/75   Pulse: 92 (!) 175 (!) 121 87  Resp: (!) '21  18 18  '$ Temp:      TempSrc:      SpO2: 97%  97% 100%  Weight:      Height:      PainSc:        Isolation Precautions No active isolations  Medications Medications  acetaminophen (TYLENOL) tablet 500 mg (has no administration in time range)  aspirin EC tablet 81 mg (81 mg Oral Given 05/24/22 1756)  ezetimibe (ZETIA) tablet 10 mg (10 mg Oral Given 05/24/22 1756)  losartan (COZAAR) tablet 100 mg (100 mg Oral Given 05/24/22 1757)  nitroGLYCERIN (NITROSTAT) SL tablet 0.4 mg (has no administration in time range)  escitalopram (LEXAPRO) tablet 10 mg (10 mg Oral Given 05/24/22 1756)  baclofen (LIORESAL) tablet 10 mg (has no administration in time range)  carbidopa-levodopa (SINEMET IR) 25-100 MG per tablet immediate release 1 tablet (1 tablet Oral Given 05/24/22 1757)  clonazepam (KLONOPIN) disintegrating tablet 0.25 mg (0.25 mg Oral Given 05/24/22 1756)  cholecalciferol (VITAMIN D3) 25 MCG (1000 UNIT) tablet 1,000 Units (has no administration in time range)  albuterol (PROVENTIL) (2.5 MG/3ML) 0.083% nebulizer solution 2.5 mg (has no administration in time range)  umeclidinium-vilanterol (ANORO ELLIPTA) 62.5-25 MCG/ACT 1 puff (1 puff Inhalation Not Given 05/24/22 1757)    And  budesonide (PULMICORT) 180 MCG/ACT inhaler 1 puff (has no administration in time range)  enoxaparin (LOVENOX) injection 30 mg (has no administration in time range)  0.45 % sodium chloride infusion (has no administration in  time range)  feeding supplement (GLUCERNA SHAKE) (GLUCERNA SHAKE) liquid 237 mL (has no administration in time range)    Mobility walks with device     Focused Assessments Cardiac Assessment Handoff:    Lab Results  Component Value Date   CKTOTAL 194 (H) 07/30/2021   TROPONINI <0.03 02/20/2016   Lab Results  Component Value Date   DDIMER 1.49 (H) 03/19/2022   Does the Patient currently have chest pain? No    R Recommendations: See Admitting  Provider Note  Report given to:   Additional Notes:

## 2022-05-24 NOTE — Assessment & Plan Note (Signed)
BP is well controlled on losartan  Plan Continue present dose losartan

## 2022-05-24 NOTE — Assessment & Plan Note (Signed)
Patient with significant confusion that started with last hospitalization for covid. She cleared some while in rehab but has become much worse. This is likely multifactorial: post-covid brain fog, Parkinson's related dementia, possibly related to HFpEF, untreated anxiety and depression, polypharmacy. MRI was negative for stroke.  Plan Address multiple co-morbidities as outlined  Delerium precautions  Patient may need long-term care - TOC consult

## 2022-05-24 NOTE — Assessment & Plan Note (Signed)
Last A1C 01/28/22 was 6.6%. She takes no medication.  Plan Low carb diet  Low carb calorie supplement

## 2022-05-24 NOTE — Assessment & Plan Note (Signed)
Long standing problem. Per husband she has not been taking her medications as directed.   Plan Continue regimen as prescribed by Dr. Larose Kells

## 2022-05-24 NOTE — Assessment & Plan Note (Signed)
Continue home regimen 

## 2022-05-24 NOTE — Assessment & Plan Note (Signed)
Long standing problem.   Plan Continue home medications  Continue abdominal binder when up.

## 2022-05-24 NOTE — ED Notes (Signed)
Per given drink and snack, okay per provider.

## 2022-05-24 NOTE — Assessment & Plan Note (Signed)
Per husband patient has poor fluid intake. Her BUN is mildly elevated and Urine is concentrated. This may be a factor in her encephalopathy  Plan Gentle hydration.

## 2022-05-24 NOTE — ED Notes (Signed)
Patient heart rate increasing from 90s to 170s, patient denies any symptoms at this time.

## 2022-05-24 NOTE — H&P (Signed)
History and Physical    Trey S Stanish [id number] DOB: 1945-09-10 DOA: 05/24/2022  DOS: the patient was seen and examined on 05/24/2022  PCP: Colon Branch, MD   Patient coming from: Home  I have personally briefly reviewed patient's old medical records in [facility]  Mrs. Juedes, a 77 y/o followed for HFpEF, Parkinson's disease, HTN, HLD, orthostatic hypotension/autonomic dysfunction, h/o TBI, SAH. In January she was hospitalized with Covid 19. Due to weakness and dibilitation she was discharged to a SNF. Approximately a week ago she was able to return home. She saw Dr. Larose Kells, her PCP, 05/19/22 where the issues was significant confusion, inability to manage her medications, continued malnutrition. Her husband reports that her confusion, which had cleared in the SNF was much worse over the past 3 days. She has not been drinking fluids but he says she eats well, she has been incontinent and less mobile. He reports she has been warm to touch but no documented fever. Due to her confusion and weakness she presents to [facility] for evaluation.   ED Course: T 97.1  127/56  HR 121-160  RR 18. Patient confused per EDP exam. Lab - glucose 106 NH3 25, CBCD nl, T. Protein 6.4CXR-NAD, hyperinflated, U/A concentrated w/o pyuria  Review of Systems:  Review of Systems  Constitutional:  Negative for chills, fever and weight loss.  HENT: Negative.    Eyes: Negative.   Respiratory: Negative.    Cardiovascular: Negative.   Gastrointestinal: Negative.   Genitourinary: Negative.   Skin: Negative.   Neurological: Negative.   Endo/Heme/Allergies: Negative.   Psychiatric/Behavioral: Negative.      Past Medical History:  Diagnosis Date   Anxiety state, unspecified    Hyperlipidemia    Hypertension    Insomnia    LBBB (left bundle branch block)    LHC in 2002 showed normal coronaries.    Osteoarthrosis, unspecified whether generalized or localized, unspecified site    Overweight(278.02)    Parkinson  disease    S/P placement of cardiac pacemaker    a. 02/21/16: Medtronic Advisa DR MRI SureScan model [id number] (serial number [id number])    Serrated adenoma of colon 2007   Symptomatic menopausal or female climacteric states    Syncope and collapse    11/12: Holter 12/12 with rare PACS, HR range 64-140, average 82, no significant arrhythmias. Echo (1/13): EF 50-55%, mild LVH, septal-lateral dyssynchrony c/w LBBB. 3-week event monitor (1/13): No significant arrhythmia.    Ventricular tachycardia ([place]) 05/24/2022   Patient with 3:1 AVB with pacemaker. She is noted to be tachycardic on telemetry but is asymptomatic  Plan    Past Surgical History:  Procedure Laterality Date   COLONOSCOPY  2007   EP IMPLANTABLE DEVICE N/A 02/21/2016   Procedure: Pacemaker Implant;  Surgeon: Will Meredith Leeds, MD;  Location: [facility];  Service: Cardiovascular;  Laterality: N/A;   POLYPECTOMY      Soc Hx- married, lives with husband.    reports that she has never smoked. She has never been exposed to tobacco smoke. She has never used smokeless tobacco. She reports that she does not drink alcohol and does not use drugs.  No Known Allergies  Family History  Problem Relation Age of Onset   Heart failure Mother    Coronary artery disease Mother    Dementia Mother    Diabetes Mother    Colon cancer Neg Hx    Breast cancer Neg Hx    Hypertension Neg Hx  Heart disease Neg Hx    Heart attack Neg Hx    Stroke Neg Hx    Rectal cancer Neg Hx    Stomach cancer Neg Hx    Esophageal cancer Neg Hx    Liver disease Neg Hx    Parkinson's disease Neg Hx     Prior to Admission medications   Medication Sig Start Date End Date Taking? Authorizing Provider  acetaminophen (TYLENOL) 500 MG tablet Take 500 mg by mouth every 6 (six) hours as needed for moderate pain.    [provider]  albuterol (PROVENTIL) (2.5 MG/3ML) 0.083% nebulizer solution Take 3 mLs (2.5 mg total) by nebulization every 2  (two) hours as needed for wheezing. 03/26/22   [name], DO  aspirin EC 81 MG tablet Take 81 mg by mouth daily. Swallow whole.    [provider]  baclofen (LIORESAL) 10 MG tablet Take 1 tablet (10 mg total) by mouth 2 (two) times daily as needed for muscle spasms. 10/13/21   Ward Givens, NP  Budeson-Glycopyrrol-Formoterol (BREZTRI AEROSPHERE) 160-9-4.8 MCG/ACT AERO Inhale 2 puffs into the lungs in the morning and at bedtime. Patient taking differently: Inhale 2 puffs into the lungs daily as needed (shortness of breath). 08/12/21   [name], [name], MD  carbidopa-levodopa (SINEMET IR) 25-100 MG tablet Take 1 tablet by mouth 4 times daily at 8am/noon/4pm/8pm Patient taking differently: Take 1 tablet by mouth 4 (four) times daily. 07/30/21   Tat, Eustace Quail, DO  Cholecalciferol (VITAMIN D PO) Take 1 tablet by mouth 2 (two) times a week. Vitamin D    [provider]  clonazePAM (KLONOPIN) 0.25 MG disintegrating tablet Take 1 tablet (0.25 mg total) by mouth 2 (two) times daily as needed (Anxiety). 05/20/22   Colon Branch, MD  escitalopram (LEXAPRO) 10 MG tablet Take 1 tablet (10 mg total) by mouth daily. 05/20/22   Colon Branch, MD  ezetimibe (ZETIA) 10 MG tablet Take 1 tablet (10 mg total) by mouth daily. 01/22/21   Colon Branch, MD  lactose free nutrition (BOOST PLUS) LIQD Take 237 mLs by mouth 2 (two) times daily between meals. 03/26/22   [name], DO  losartan (COZAAR) 100 MG tablet Take 1 tablet (100 mg total) by mouth daily. 10/27/21   Colon Branch, MD  nitroGLYCERIN (NITROSTAT) 0.4 MG SL tablet Place 1 tablet (0.4 mg total) under the tongue every 5 (five) minutes x 3 doses as needed for chest pain. 08/26/21   Baldwin Jamaica, PA-C    Physical Exam: Vitals:   05/24/22 1645 05/24/22 1700 05/24/22 1715 05/24/22 1800  BP: 122/60  (!) 127/56 (!) 145/75  Pulse: 92 (!) 175 (!) 121 87  Resp: (!) '21  18 18  '$ Temp:      TempSrc:      SpO2: 97%  97% 100%  Weight:       Height:        Physical Exam Constitutional:      General: She is not in acute distress.    Comments: Very thin, almost cachectic. In no distress  HENT:     Mouth/Throat:     Mouth: Mucous membranes are dry.     Pharynx: Oropharynx is clear. No oropharyngeal exudate.  Eyes:     Extraocular Movements: Extraocular movements intact.     Conjunctiva/sclera: Conjunctivae normal.     Pupils: Pupils are equal, round, and reactive to light.  Cardiovascular:     Rate and Rhythm: Regular  rhythm. Tachycardia present.     Pulses: Normal pulses.     Heart sounds: Normal heart sounds. No murmur heard.    Comments: Pacemaker upper left chest Pulmonary:     Effort: Pulmonary effort is normal.     Breath sounds: Normal breath sounds.  Abdominal:     General: Bowel sounds are normal.     Palpations: Abdomen is soft.     Tenderness: There is no abdominal tenderness. There is no guarding.  Musculoskeletal:        General: No swelling or tenderness.     Cervical back: Normal range of motion and neck supple.     Right lower leg: No edema.     Left lower leg: No edema.  Skin:    General: Skin is warm and dry.  Neurological:     General: No focal deficit present.     Mental Status: She is oriented to person, place, and time.     Cranial Nerves: No cranial nerve deficit.  Psychiatric:        Mood and Affect: Mood normal.        Behavior: Behavior normal.      Labs on Admission: I have personally reviewed following labs and imaging studies  CBC: Recent Labs  Lab 05/24/22 1026  WBC 6.1  NEUTROABS 3.9  HGB 13.3  HCT 40.8  MCV 87.4  PLT 123XX123   Basic Metabolic Panel: Recent Labs  Lab 05/24/22 1026  NA 140  K 3.7  CL 106  CO2 22  GLUCOSE 106*  BUN 28*  CREATININE 0.84  CALCIUM 9.2   GFR: Estimated Creatinine Clearance: 42.9 mL/min (by C-G formula based on SCr of 0.84 mg/dL). Liver Function Tests: Recent Labs  Lab 05/24/22 1026  AST 23  ALT 14  ALKPHOS 64  BILITOT 0.8   PROT 6.4*  ALBUMIN 3.5   No results for input(s): "LIPASE", "AMYLASE" in the last 168 hours. Recent Labs  Lab 05/24/22 1600  AMMONIA 25   Coagulation Profile: Recent Labs  Lab 05/24/22 1050  INR 1.1   Cardiac Enzymes: No results for input(s): "CKTOTAL", "CKMB", "CKMBINDEX", "TROPONINI" in the last 168 hours. BNP (last 3 results) No results for input(s): "PROBNP" in the last 8760 hours. HbA1C: No results for input(s): "HGBA1C" in the last 72 hours. CBG: No results for input(s): "GLUCAP" in the last 168 hours. Lipid Profile: No results for input(s): "CHOL", "HDL", "LDLCALC", "TRIG", "CHOLHDL", "LDLDIRECT" in the last 72 hours. Thyroid Function Tests: No results for input(s): "TSH", "T4TOTAL", "FREET4", "T3FREE", "THYROIDAB" in the last 72 hours. Anemia Panel: No results for input(s): "VITAMINB12", "FOLATE", "FERRITIN", "TIBC", "IRON", "RETICCTPCT" in the last 72 hours. Urine analysis:    Component Value Date/Time   COLORURINE YELLOW 05/24/2022 1031   APPEARANCEUR CLEAR 05/24/2022 1031   LABSPEC >1.030 (H) 05/24/2022 1031   PHURINE 7.0 05/24/2022 1031   GLUCOSEU NEGATIVE 05/24/2022 1031   GLUCOSEU NEGATIVE 01/28/2007 1200   HGBUR NEGATIVE 05/24/2022 1031   BILIRUBINUR SMALL (A) 05/24/2022 1031   KETONESUR NEGATIVE 05/24/2022 1031   PROTEINUR NEGATIVE 05/24/2022 1031   UROBILINOGEN 0.2 01/25/2015 0915   NITRITE NEGATIVE 05/24/2022 1031   LEUKOCYTESUR NEGATIVE 05/24/2022 1031    Radiological Exams on Admission: I have personally reviewed images CT HEAD WO CONTRAST (5MM)  Result Date: 05/24/2022 CLINICAL DATA:  77 year old female with altered mental status. 2016 history of fall with right frontotemporal hemorrhagic contusions, intracranial hemorrhage. EXAM: CT HEAD WITHOUT CONTRAST TECHNIQUE: Contiguous axial images were obtained from  the base of the skull through the vertex without intravenous contrast. RADIATION DOSE REDUCTION: This exam was performed according to the  departmental dose-optimization program which includes automated exposure control, adjustment of the mA and/or kV according to patient size and/or use of iterative reconstruction technique. COMPARISON:  Head CT levin 2817. FINDINGS: Brain: Chronic encephalomalacia in the right inferior frontal gyrus, right insula and operculum. Mild generalized cerebral volume loss since 2017. No superimposed No midline shift, ventriculomegaly, mass effect, evidence of mass lesion, intracranial hemorrhage or evidence of cortically based acute infarction. Otherwise mild for age patchy bilateral white matter hypodensity. Vascular: Calcified atherosclerosis at the skull base. No suspicious intracranial vascular hyperdensity. Skull: No acute osseous abnormality identified. Sinuses/Orbits: Minimal new paranasal sinus mucosal thickening since 2017. No sinus fluid levels. Tympanic cavities and mastoids remain clear. Other: Postoperative changes to both globes since the prior CT. No acute orbit or scalp soft tissue finding. IMPRESSION: 1. No acute intracranial abnormality. 2. Stable chronic posttraumatic right frontotemporal encephalomalacia. Mild for age cerebral white matter changes. Electronically Signed   By: Genevie Ann M.D.   On: 05/24/2022 11:59   DG Chest Port 1 View  Result Date: 05/24/2022 CLINICAL DATA:  Questionable sepsis. EXAM: PORTABLE CHEST 1 VIEW COMPARISON:  None Available. FINDINGS: 1052 hours. Lungs are hyperexpanded. The lungs are clear without focal pneumonia, edema, pneumothorax or pleural effusion. The cardiopericardial silhouette is within normal limits for size. The visualized bony structures of the thorax are unremarkable. Telemetry leads overlie the chest. Left permanent pacemaker again noted. IMPRESSION: Hyperexpansion without acute cardiopulmonary findings. Electronically Signed   By: Misty Stanley M.D.   On: 05/24/2022 11:02    EKG: I have personally reviewed EKG: sinus rhythm, increased PR interval, RAE,  IVCD, no acute changes, old inferior injury  Assessment/Plan Principal Problem:   Encephalopathy, metabolic Active Problems:   Ventricular tachycardia (HCC)   Dehydration   HLD (hyperlipidemia)   Anxiety   Borderline diabetes   Orthostatic hypotension   Essential hypertension   Diastolic CHF (HCC)   Atypical parkinsonism   Malnutrition of moderate degree   Metabolic encephalopathy    Assessment and Plan: Dehydration Per husband patient has poor fluid intake. Her BUN is mildly elevated and Urine is concentrated. This may be a factor in her encephalopathy  Plan Gentle hydration.   Ventricular tachycardia [facility]) Patient with 3:1 AVB with pacemaker. She is noted to be tachycardic on telemetry but is asymptomatic  Plan Add BB - coreg  No indication for continuous telemetry  Metabolic encephalopathy Patient with significant confusion that started with last hospitalization for covid. She cleared some while in rehab but has become much worse. This is likely multifactorial: post-covid brain fog, Parkinson's related dementia, possibly related to HFpEF, untreated anxiety and depression, polypharmacy. MRI was negative for stroke.  Plan Address multiple co-morbidities as outlined  Delerium precautions  Patient may need long-term care - TOC consult  Malnutrition of moderate degree Slightly low albumin and total protein. She has been drinking boost at home.  Plan Continue calorie supplement with glucerna or equivalent  RD consult  Atypical parkinsonism Patient followed by Internal medicine. She has seen neurology. She has continue to take her medications. She has progressive symptoms. At baseline she has a significant stutter but dysarthria may be getting worse. There is a concern for Parkinson's related dementia. .  Plan Continue Carbidopa-levodopa  SLP evaluation for possible aspiration  Diastolic CHF [facility]) Patient appears well compensated with normal pulmonary exam. Last ECHO  10/20/19  with EF 50-55%, grade I DD.  Plan BNP-if elevated resume lasix  ECHO    Essential hypertension BP is well controlled on losartan  Plan Continue present dose losartan  Orthostatic hypotension Long standing problem.   Plan Continue home medications  Continue abdominal binder when up.   Borderline diabetes Last A1C 01/28/22 was 6.6%. She takes no medication.  Plan Low carb diet  Low carb calorie supplement  Anxiety Long standing problem. Per husband she has not been taking her medications as directed.   Plan Continue regimen as prescribed by Dr. Larose Kells  HLD (hyperlipidemia) Continue home regimen       DVT prophylaxis: Lovenox Code Status: Full Code Family Communication: husband present for interview. Understands Dx and Tx plan. Understands that she may need long term care  Disposition Plan: TBD  Consults called: none  Admission status: Inpatient, Med-Surg   Adella Hare, MD [facility] 05/24/2022, 6:24 PM

## 2022-05-24 NOTE — Subjective & Objective (Signed)
Mandy Durham, a 77 y/o followed for HFpEF, Parkinson's disease, HTN, HLD, orthostatic hypotension/autonomic dysfunction, h/o TBI, SAH. In January she was hospitalized with Covid 19. Due to weakness and dibilitation she was discharged to a SNF. Approximately a week ago she was able to return home. She saw Dr. Larose Kells, her PCP, 05/19/22 where the issues was significant confusion, inability to manage her medications, continued malnutrition. Her husband reports that her confusion, which had cleared in the SNF was much worse over the past 3 days. She has not been drinking fluids but he says she eats well, she has been incontinent and less mobile. He reports she has been warm to touch but no documented fever. Due to her confusion and weakness she presents to [facility] for evaluation.

## 2022-05-24 NOTE — ED Notes (Signed)
Admitting MD made aware of heart rate. No new orders

## 2022-05-24 NOTE — Assessment & Plan Note (Signed)
Patient followed by Internal medicine. She has seen neurology. She has continue to take her medications. She has progressive symptoms. At baseline she has a significant stutter but dysarthria may be getting worse. There is a concern for Parkinson's related dementia. .  Plan Continue Carbidopa-levodopa  SLP evaluation for possible aspiration

## 2022-05-24 NOTE — ED Notes (Signed)
ED TO INPATIENT HANDOFF REPORT  ED Nurse Name and Phone #: [phone] Keirstin Musil  S Name/Age/Gender Mandy Durham 76 y.o. female Room/Bed: 041C/041C  Code Status   Code Status: Full Code  Home/SNF/Other Home Patient oriented to: self, place, time, and situation Is this baseline? Yes   Triage Complete: Triage complete  Chief Complaint Encephalopathy, metabolic 99991111  Triage Note Patient prsents to ed vial [facility] from home of which she lives with her husband. He states she wasn't acting her normal self today, Strong odor of urine she was also incont of which she states is new for her.    Allergies No Known Allergies  Level of Care/Admitting Diagnosis ED Disposition     ED Disposition  Admit   Condition  --   Comment  Hospital Area: [facility] [[id number]]  Level of Care: Med-Surg [[id number]]  May admit patient to [facility] or [facility] if equivalent level of care is available:: No  Covid Evaluation: Asymptomatic - no recent exposure (last 10 days) testing not required  Diagnosis: Encephalopathy, metabolic 123456  Admitting Physician: Neena Rhymes [[id number]]  Attending Physician: Neena Rhymes [id number]  Certification:: I certify this patient will need inpatient services for at least 2 midnights  Estimated Length of Stay: 3          B Medical/Surgery History Past Medical History:  Diagnosis Date   Anxiety state, unspecified    Hyperlipidemia    Hypertension    Insomnia    LBBB (left bundle branch block)    LHC in 2002 showed normal coronaries.    Osteoarthrosis, unspecified whether generalized or localized, unspecified site    Overweight(278.02)    Parkinson disease    S/P placement of cardiac pacemaker    a. 02/21/16: [employer]onic Advisa DR MRI SureScan model [id number] (serial number [id number])    Serrated adenoma of colon 2007   Symptomatic menopausal or female climacteric states    Syncope and collapse    11/12: Holter 12/12 with rare PACS,  HR range 64-140, average 82, no significant arrhythmias. Echo (1/13): EF 50-55%, mild LVH, septal-lateral dyssynchrony c/w LBBB. 3-week event monitor (1/13): No significant arrhythmia.    Ventricular tachycardia ([place]) 05/24/2022   Patient with 3:1 AVB with pacemaker. She is noted to be tachycardic on telemetry but is asymptomatic  Plan   Past Surgical History:  Procedure Laterality Date   COLONOSCOPY  2007   EP IMPLANTABLE DEVICE N/A 02/21/2016   Procedure: Pacemaker Implant;  Surgeon: Will Meredith Leeds, MD;  Location: [facility];  Service: Cardiovascular;  Laterality: N/A;   POLYPECTOMY       A IV Location/Drains/Wounds Patient Lines/Drains/Airways Status     Active Line/Drains/Airways     Name Placement date Placement time Site Days   Peripheral IV 05/24/22 22 G 1" Anterior;Proximal;Right Forearm 05/24/22  1034  Forearm  less than 1            Intake/Output Last 24 hours No intake or output data in the 24 hours ending 05/24/22 2038  Labs/Imaging Results for orders placed or performed during the hospital encounter of 05/24/22 (from the past 48 hour(s))  Lactic acid, plasma     Status: None   Collection Time: 05/24/22 10:26 AM  Result Value Ref Range   Lactic Acid, Venous 1.0 0.5 - 1.9 mmol/L    Comment: Performed at [facility] [address] [place], [place] [address]  Comprehensive metabolic panel  Status: Abnormal   Collection Time: 05/24/22 10:26 AM  Result Value Ref Range   Sodium 140 135 - 145 mmol/L   Potassium 3.7 3.5 - 5.1 mmol/L   Chloride 106 98 - 111 mmol/L   CO2 22 22 - 32 mmol/L   Glucose, Bld 106 (H) 70 - 99 mg/dL    Comment: Glucose reference range applies only to samples taken after fasting for at least 8 hours.   BUN 28 (H) 8 - 23 mg/dL   Creatinine, Ser 0.84 0.44 - 1.00 mg/dL   Calcium 9.2 8.9 - 10.3 mg/dL   Total Protein 6.4 (L) 6.5 - 8.1 g/dL   Albumin 3.5 3.5 - 5.0 g/dL   AST 23 15 - 41 U/L   ALT 14 0 - 44 U/L    Alkaline Phosphatase 64 38 - 126 U/L   Total Bilirubin 0.8 0.3 - 1.2 mg/dL   GFR, Estimated >60 >60 mL/min    Comment: (NOTE) Calculated using the CKD-EPI Creatinine Equation (2021)    Anion gap 12 5 - 15    Comment: Performed at [facility] [address], [place], [place] [address]  CBC with Differential     Status: None   Collection Time: 05/24/22 10:26 AM  Result Value Ref Range   WBC 6.1 4.0 - 10.5 K/uL   RBC 4.67 3.87 - 5.11 MIL/uL   Hemoglobin 13.3 12.0 - 15.0 g/dL   HCT 40.8 36.0 - 46.0 %   MCV 87.4 80.0 - 100.0 fL   MCH 28.5 26.0 - 34.0 pg   MCHC 32.6 30.0 - 36.0 g/dL   RDW 15.2 11.5 - 15.5 %   Platelets 297 150 - 400 K/uL   nRBC 0.0 0.0 - 0.2 %   Neutrophils Relative % 62 %   Neutro Abs 3.9 1.7 - 7.7 K/uL   Lymphocytes Relative 26 %   Lymphs Abs 1.6 0.7 - 4.0 K/uL   Monocytes Relative 9 %   Monocytes Absolute 0.5 0.1 - 1.0 K/uL   Eosinophils Relative 1 %   Eosinophils Absolute 0.1 0.0 - 0.5 K/uL   Basophils Relative 1 %   Basophils Absolute 0.0 0.0 - 0.1 K/uL   Immature Granulocytes 1 %   Abs Immature Granulocytes 0.03 0.00 - 0.07 K/uL    Comment: Performed at [facility], [address] [place], [place] [address]  Urinalysis, w/ Reflex to Culture (Infection Suspected) -Urine, Clean Catch     Status: Abnormal   Collection Time: 05/24/22 10:31 AM  Result Value Ref Range   Specimen Source URINE, CLEAN CATCH    Color, Urine YELLOW YELLOW   APPearance CLEAR CLEAR   Specific Gravity, Urine >1.030 (H) 1.005 - 1.030   pH 7.0 5.0 - 8.0   Glucose, UA NEGATIVE NEGATIVE mg/dL   Hgb urine dipstick NEGATIVE NEGATIVE   Bilirubin Urine SMALL (A) NEGATIVE   Ketones, ur NEGATIVE NEGATIVE mg/dL   Protein, ur NEGATIVE NEGATIVE mg/dL   Nitrite NEGATIVE NEGATIVE   Leukocytes,Ua NEGATIVE NEGATIVE   Squamous Epithelial / HPF 0-5 0 - 5 /HPF   WBC, UA 0-5 0 - 5 WBC/hpf    Comment: Reflex urine culture not performed if WBC <=10, OR if Squamous epithelial cells >5.  If Squamous epithelial cells >5, suggest recollection.   RBC / HPF 0-5 0 - 5 RBC/hpf   Bacteria, UA RARE (A) NONE SEEN   Urine-Other MICROSCOPIC EXAM PERFORMED ON UNCONCENTRATED URINE     Comment: Performed at  [facility], [place] [address] [place], [place] [address]  Resp panel by RT-PCR (RSV, Flu A&B, Covid) Anterior Nasal Swab     Status: None   Collection Time: 05/24/22 10:32 AM   Specimen: Anterior Nasal Swab  Result Value Ref Range   SARS Coronavirus 2 by RT PCR NEGATIVE NEGATIVE   Influenza A by PCR NEGATIVE NEGATIVE   Influenza B by PCR NEGATIVE NEGATIVE    Comment: (NOTE) The Xpert Xpress SARS-CoV-2/FLU/RSV plus assay is intended as an aid in the diagnosis of influenza from Nasopharyngeal swab specimens and should not be used as a sole basis for treatment. Nasal washings and aspirates are unacceptable for Xpert Xpress SARS-CoV-2/FLU/RSV testing.  Fact Sheet for Patients: EntrepreneurPulse.com.au  Fact Sheet for Healthcare Providers: IncredibleEmployment.be  This test is not yet approved or cleared by the [place] FDA and has been authorized for detection and/or diagnosis of SARS-CoV-2 by FDA under an Emergency Use Authorization (EUA). This EUA will remain in effect (meaning this test can be used) for the duration of the COVID-19 declaration under Section 564(b)(1) of the Act, 21 U.S.C. section 360bbb-3(b)(1), unless the authorization is terminated or revoked.     Resp Syncytial Virus by PCR NEGATIVE NEGATIVE    Comment: (NOTE) Fact Sheet for Patients: EntrepreneurPulse.com.au  Fact Sheet for Healthcare Providers: IncredibleEmployment.be  This test is not yet approved or cleared by the [place] FDA and has been authorized for detection and/or diagnosis of SARS-CoV-2 by FDA under an Emergency Use Authorization (EUA). This EUA will remain in effect (meaning this test can be used)  for the duration of the COVID-19 declaration under Section 564(b)(1) of the Act, 21 U.S.C. section 360bbb-3(b)(1), unless the authorization is terminated or revoked.  Performed at [facility], [place] [address] [place], [place] [address]   Protime-INR     Status: None   Collection Time: 05/24/22 10:50 AM  Result Value Ref Range   Prothrombin Time 13.8 11.4 - 15.2 seconds   INR 1.1 0.8 - 1.2    Comment: (NOTE) INR goal varies based on device and disease states. Performed at [facility], [place] [address] [place], [place] [address]   APTT     Status: None   Collection Time: 05/24/22 10:50 AM  Result Value Ref Range   aPTT 25 24 - 36 seconds    Comment: Performed at [facility] [address], [place], [place] [address]  Ammonia     Status: None   Collection Time: 05/24/22  4:00 PM  Result Value Ref Range   Ammonia 25 9 - 35 umol/L    Comment: Performed at [facility], [place] [address], [place], [place] [address]   *Note: Due to a large number of results and/or encounters for the requested time period, some results have not been displayed. A complete set of results can be found in Results Review.   CT HEAD WO CONTRAST (5MM)  Result Date: 05/24/2022 CLINICAL DATA:  77 year old female with altered mental status. 2016 history of fall with right frontotemporal hemorrhagic contusions, intracranial hemorrhage. EXAM: CT HEAD WITHOUT CONTRAST TECHNIQUE: Contiguous axial images were obtained from the base of the skull through the vertex without intravenous contrast. RADIATION DOSE REDUCTION: This exam was performed according to the departmental dose-optimization program which includes automated exposure control, adjustment of the mA and/or kV according to patient size and/or use of iterative reconstruction technique. COMPARISON:  Head CT levin 2817. FINDINGS: Brain: Chronic encephalomalacia  in the right inferior frontal gyrus, right insula and operculum. Mild generalized  cerebral volume loss since 2017. No superimposed No midline shift, ventriculomegaly, mass effect, evidence of mass lesion, intracranial hemorrhage or evidence of cortically based acute infarction. Otherwise mild for age patchy bilateral white matter hypodensity. Vascular: Calcified atherosclerosis at the skull base. No suspicious intracranial vascular hyperdensity. Skull: No acute osseous abnormality identified. Sinuses/Orbits: Minimal new paranasal sinus mucosal thickening since 2017. No sinus fluid levels. Tympanic cavities and mastoids remain clear. Other: Postoperative changes to both globes since the prior CT. No acute orbit or scalp soft tissue finding. IMPRESSION: 1. No acute intracranial abnormality. 2. Stable chronic posttraumatic right frontotemporal encephalomalacia. Mild for age cerebral white matter changes. Electronically Signed   By: Genevie Ann M.D.   On: 05/24/2022 11:59   DG Chest Port 1 View  Result Date: 05/24/2022 CLINICAL DATA:  Questionable sepsis. EXAM: PORTABLE CHEST 1 VIEW COMPARISON:  None Available. FINDINGS: 1052 hours. Lungs are hyperexpanded. The lungs are clear without focal pneumonia, edema, pneumothorax or pleural effusion. The cardiopericardial silhouette is within normal limits for size. The visualized bony structures of the thorax are unremarkable. Telemetry leads overlie the chest. Left permanent pacemaker again noted. IMPRESSION: Hyperexpansion without acute cardiopulmonary findings. Electronically Signed   By: Misty Stanley M.D.   On: 05/24/2022 11:02    Pending Labs Unresulted Labs (From admission, onward)     Start     Ordered   05/31/22 0500  Creatinine, serum  (enoxaparin (LOVENOX)    CrCl >/= 30 ml/min)  Weekly,   R     Comments: while on enoxaparin therapy    05/24/22 1823   05/24/22 1820  Brain natriuretic peptide  Add-on,   AD        05/24/22 1823            Vitals/Pain Today's Vitals   05/24/22 1849 05/24/22 1900 05/24/22 1910 05/24/22 1949  BP:   (!) 117/55  (!) 96/50  Pulse: (!) 142 (!) 158 (!) 171 83  Resp:  (!) '23 18 14  '$ Temp:    98.5 F (36.9 C)  TempSrc:    Oral  SpO2:  98% 98% 94%  Weight:      Height:      PainSc:        Isolation Precautions No active isolations  Medications Medications  acetaminophen (TYLENOL) tablet 500 mg (has no administration in time range)  aspirin EC tablet 81 mg (81 mg Oral Given 05/24/22 1756)  ezetimibe (ZETIA) tablet 10 mg (10 mg Oral Given 05/24/22 1756)  losartan (COZAAR) tablet 100 mg (100 mg Oral Given 05/24/22 1757)  nitroGLYCERIN (NITROSTAT) SL tablet 0.4 mg (has no administration in time range)  escitalopram (LEXAPRO) tablet 10 mg (10 mg Oral Given 05/24/22 1756)  baclofen (LIORESAL) tablet 10 mg (has no administration in time range)  carbidopa-levodopa (SINEMET IR) 25-100 MG per tablet immediate release 1 tablet (1 tablet Oral Given 05/24/22 1757)  clonazepam (KLONOPIN) disintegrating tablet 0.25 mg (0.25 mg Oral Given 05/24/22 1930)  cholecalciferol (VITAMIN D3) 25 MCG (1000 UNIT) tablet 1,000 Units (has no administration in time range)  albuterol (PROVENTIL) (2.5 MG/3ML) 0.083% nebulizer solution 2.5 mg (has no administration in time range)  umeclidinium-vilanterol (ANORO ELLIPTA) 62.5-25 MCG/ACT 1 puff (1 puff Inhalation Not Given 05/24/22 1757)    And  budesonide (PULMICORT) 180 MCG/ACT inhaler 1 puff (1 puff Inhalation Not Given 05/24/22 2036)  enoxaparin (LOVENOX) injection 30 mg (30 mg Subcutaneous Given 05/24/22 1931)  0.45 % sodium chloride infusion ( Intravenous New Bag/Given 05/24/22 1933)  feeding supplement (GLUCERNA SHAKE) (GLUCERNA SHAKE) liquid 237 mL (237 mLs Oral Not Given 05/24/22 2037)    Mobility non-ambulatory     Focused Assessments Neuro Assessment Handoff:  Swallow screen pass? Yes          Neuro Assessment: Exceptions to [facility] Neuro Checks:      Has TPA been given? No If patient is a Neuro Trauma and patient is going to OR before floor call report to  [facility] nurse: [phone] or [phone]   R Recommendations: See Admitting Provider Note  Report given to:   Additional Notes: Patient arrived via ems from home after spouse called stating she was not acting normal. Patient is alert but unable to articulate words appropriately at present time being admitted for altered mental status

## 2022-05-24 NOTE — ED Provider Notes (Signed)
[facility] Provider Note   CSN: [id number] Arrival date & time: 05/24/22  1008     History  Chief Complaint  Patient presents with   Altered Mental Status    Mandy Durham is a 77 y.o. female.  Patient with history of medical history significant of atypical parkinson's with associated speech difficulties, HLD, HTN, HLD, sick sinus syndrome s/p pacemaker, hx of autonomic insufficiency, CHF, history of traumatic SAH with resulting TBI, prediabetes  -- presents to the emergency department by EMS from home today where she lives with her husband.  Patient has no acute complaints.  She does state that her husband called the ambulance because he thought she was not acting right.  Review of records show that home health had called PCP a couple of days ago to request UA.  EMS reports patient warm to touch but afebrile for them, with strong odor of urine and urinary incontinence.  No fevers per patient report.  No vomiting or diarrhea.  Patient was admitted to the hospital in January for weakness, found to have COVID infection, was then discharged to skilled nursing facility but has since returned home.  Additional history was obtained from the patient's husband.  He reports that patient had improved somewhat while in rehab.  He was discharged about 8 days ago.  Since that time patient has become more somnolent at home, nonparticipatory in ADLs.  She has been more confused and paranoid, thinking that people are out to get her.  She has had several episodes of urinary incontinence.  She refuses to take her medications as prescribed at times and has been having difficulty with ambulation and refusing to use a walker.  He has concerns about her safety at home and is unable to care for her currently.  Patient's medications have been well-organized and he does not feel that she is taking too much of any of her medications.       Home Medications Prior to  Admission medications   Medication Sig Start Date End Date Taking? Authorizing Provider  acetaminophen (TYLENOL) 500 MG tablet Take 500 mg by mouth every 6 (six) hours as needed for moderate pain.    [provider]  albuterol (PROVENTIL) (2.5 MG/3ML) 0.083% nebulizer solution Take 3 mLs (2.5 mg total) by nebulization every 2 (two) hours as needed for wheezing. 03/26/22   [name], DO  aspirin EC 81 MG tablet Take 81 mg by mouth daily. Swallow whole.    [provider]  baclofen (LIORESAL) 10 MG tablet Take 1 tablet (10 mg total) by mouth 2 (two) times daily as needed for muscle spasms. 10/13/21   Ward Givens, NP  Budeson-Glycopyrrol-Formoterol (BREZTRI AEROSPHERE) 160-9-4.8 MCG/ACT AERO Inhale 2 puffs into the lungs in the morning and at bedtime. Patient taking differently: Inhale 2 puffs into the lungs daily as needed (shortness of breath). 08/12/21   [name], [name], MD  carbidopa-levodopa (SINEMET IR) 25-100 MG tablet Take 1 tablet by mouth 4 times daily at 8am/noon/4pm/8pm Patient taking differently: Take 1 tablet by mouth 4 (four) times daily. 07/30/21   Tat, Eustace Quail, DO  Cholecalciferol (VITAMIN D PO) Take 1 tablet by mouth 2 (two) times a week. Vitamin D    [provider]  clonazePAM (KLONOPIN) 0.25 MG disintegrating tablet Take 1 tablet (0.25 mg total) by mouth 2 (two) times daily as needed (Anxiety). 05/20/22   Colon Branch, MD  escitalopram (LEXAPRO) 10 MG tablet Take  1 tablet (10 mg total) by mouth daily. 05/20/22   Colon Branch, MD  ezetimibe (ZETIA) 10 MG tablet Take 1 tablet (10 mg total) by mouth daily. 01/22/21   Colon Branch, MD  lactose free nutrition (BOOST PLUS) LIQD Take 237 mLs by mouth 2 (two) times daily between meals. 03/26/22   [name], DO  losartan (COZAAR) 100 MG tablet Take 1 tablet (100 mg total) by mouth daily. 10/27/21   Colon Branch, MD  nitroGLYCERIN (NITROSTAT) 0.4 MG SL tablet Place 1 tablet (0.4 mg total) under the tongue  every 5 (five) minutes x 3 doses as needed for chest pain. 08/26/21   Baldwin Jamaica, PA-C      Allergies    Patient has no known allergies.    Review of Systems   Review of Systems  Physical Exam Updated Vital Signs BP (!) 170/91 (BP Location: Right Arm)   Pulse 76   Temp 98.6 F (37 C) (Oral)   Resp 20   Ht '5\' 7"'$  (1.702 m)   Wt 48.5 kg   SpO2 99%   BMI 16.76 kg/m   Physical Exam Vitals and nursing note reviewed.  Constitutional:      General: She is not in acute distress.    Appearance: She is well-developed.  HENT:     Head: Normocephalic and atraumatic.     Right Ear: External ear normal.     Left Ear: External ear normal.     Nose: Nose normal.  Eyes:     Conjunctiva/sclera: Conjunctivae normal.  Cardiovascular:     Rate and Rhythm: Normal rate and regular rhythm.     Heart sounds: No murmur heard. Pulmonary:     Effort: No respiratory distress.     Breath sounds: No wheezing, rhonchi or rales.  Abdominal:     Palpations: Abdomen is soft.     Tenderness: There is no abdominal tenderness. There is no guarding or rebound.  Musculoskeletal:     Cervical back: Normal range of motion and neck supple.     Right lower leg: No edema.     Left lower leg: No edema.  Skin:    General: Skin is warm and dry.     Findings: No rash.  Neurological:     General: No focal deficit present.     Mental Status: She is alert. Mental status is at baseline. She is disoriented.     Motor: No weakness.     Comments: States year is 2024, but does not know the month and states that it is the 24th.  She is overall slow to respond to questions regarding history of present illness.  Psychiatric:        Mood and Affect: Mood normal.     ED Results / Procedures / Treatments   Labs (all labs ordered are listed, but only abnormal results are displayed) Labs Reviewed  COMPREHENSIVE METABOLIC PANEL - Abnormal; Notable for the following components:      Result Value   Glucose, Bld  106 (*)    BUN 28 (*)    Total Protein 6.4 (*)    All other components within normal limits  URINALYSIS, W/ REFLEX TO CULTURE (INFECTION SUSPECTED) - Abnormal; Notable for the following components:   Specific Gravity, Urine >1.030 (*)    Bilirubin Urine SMALL (*)    Bacteria, UA RARE (*)    All other components within normal limits  RESP PANEL BY RT-PCR (RSV, FLU A&B,  COVID)  RVPGX2  LACTIC ACID, PLASMA  CBC WITH DIFFERENTIAL/PLATELET  PROTIME-INR  APTT  AMMONIA    EKG EKG Interpretation  Date/Time:  Sunday May 24 2022 10:51:04 EDT Ventricular Rate:  75 PR Interval:  284 QRS Duration: 155 QT Interval:  457 QTC Calculation: 511 R Axis:   199 Text Interpretation: Sinus rhythm Prolonged PR interval Right atrial enlargement Nonspecific intraventricular conduction delay Anterior infarct, old Abnormal T, consider ischemia, lateral leads Artifact in lead(s) I II III aVR aVL aVF V1 when compared top rior, similar appearance. longer QTc. No STEMI Confirmed by Antony Blackbird [phone]) on 05/24/2022 1:36:58 PM  Radiology CT HEAD WO CONTRAST (5MM)  Result Date: 05/24/2022 CLINICAL DATA:  77 year old female with altered mental status. 2016 history of fall with right frontotemporal hemorrhagic contusions, intracranial hemorrhage. EXAM: CT HEAD WITHOUT CONTRAST TECHNIQUE: Contiguous axial images were obtained from the base of the skull through the vertex without intravenous contrast. RADIATION DOSE REDUCTION: This exam was performed according to the departmental dose-optimization program which includes automated exposure control, adjustment of the mA and/or kV according to patient size and/or use of iterative reconstruction technique. COMPARISON:  Head CT levin 2817. FINDINGS: Brain: Chronic encephalomalacia in the right inferior frontal gyrus, right insula and operculum. Mild generalized cerebral volume loss since 2017. No superimposed No midline shift, ventriculomegaly, mass effect, evidence of  mass lesion, intracranial hemorrhage or evidence of cortically based acute infarction. Otherwise mild for age patchy bilateral white matter hypodensity. Vascular: Calcified atherosclerosis at the skull base. No suspicious intracranial vascular hyperdensity. Skull: No acute osseous abnormality identified. Sinuses/Orbits: Minimal new paranasal sinus mucosal thickening since 2017. No sinus fluid levels. Tympanic cavities and mastoids remain clear. Other: Postoperative changes to both globes since the prior CT. No acute orbit or scalp soft tissue finding. IMPRESSION: 1. No acute intracranial abnormality. 2. Stable chronic posttraumatic right frontotemporal encephalomalacia. Mild for age cerebral white matter changes. Electronically Signed   By: Genevie Ann M.D.   On: 05/24/2022 11:59   DG Chest Port 1 View  Result Date: 05/24/2022 CLINICAL DATA:  Questionable sepsis. EXAM: PORTABLE CHEST 1 VIEW COMPARISON:  None Available. FINDINGS: 1052 hours. Lungs are hyperexpanded. The lungs are clear without focal pneumonia, edema, pneumothorax or pleural effusion. The cardiopericardial silhouette is within normal limits for size. The visualized bony structures of the thorax are unremarkable. Telemetry leads overlie the chest. Left permanent pacemaker again noted. IMPRESSION: Hyperexpansion without acute cardiopulmonary findings. Electronically Signed   By: Misty Stanley M.D.   On: 05/24/2022 11:02    Procedures Procedures    Medications Ordered in ED Medications - No data to display  ED Course/ Medical Decision Making/ A&P    Patient seen and examined. History obtained directly from patient.  Previous hospitalization notes as well as neurology and cardiology notes personally reviewed.  Also reviewed previous MRI results.  Labs/EKG: Ordered CBC, CMP, lactate, troponin, UA.   Imaging: Ordered chest x-ray.  Medications/Fluids: Ordered: None ordered.   Most recent vital signs reviewed and are as follows: BP (!)  170/91 (BP Location: Right Arm)   Pulse 76   Temp 98.6 F (37 C) (Oral)   Resp 20   Ht '5\' 7"'$  (1.702 m)   Wt 48.5 kg   SpO2 99%   BMI 16.76 kg/m   Initial impression: Altered mental status, evaluate for UTI  2:15 PM Reassessment performed. Patient appears stable. UA delay due to inability obtain urine.  Order for cath was placed.  Labs personally reviewed and  interpreted including: CBC with diff; CMP normal; lactate normal 1.0; PT/INR and aPTT normal; viral respiratory panel neg; UA without compelling signs of infection.  Imaging personally visualized and interpreted including: CT without acute findings; chest x-ray agree hyperexpansion without pneumonia  Reviewed pertinent lab work and imaging with patient at bedside. Questions answered.   Patient discussed with and seen by Dr. Sherry Ruffing. Additional history obtained.  Patient with episodes of delirium, psychosis at home.  She has also been urinating and incontinent.  Family reports acute on chronic word-finding difficulties.  Most current vital signs reviewed and are as follows: BP (!) 157/75   Pulse 78   Temp 97.6 F (36.4 C) (Rectal)   Resp 15   Ht '5\' 7"'$  (1.702 m)   Wt 48.5 kg   SpO2 96%   BMI 16.76 kg/m   Plan: Patient has had several MRIs since her pacemaker placement.  Will attempt to get MRI brain without contrast today.  3:54 PM I spoke with the patient's husband and he verbalizes concerns.  Currently awaiting MRI.  Patient is sleeping in bed.  Signout to Dr. Reather Converse at shift change.                               Medical Decision Making Amount and/or Complexity of Data Reviewed Labs: ordered. Radiology: ordered. ECG/medicine tests: ordered.   Patient with weakness, delirium and altered mental status, awaiting completion of workup.        Final Clinical Impression(s) / ED Diagnoses Final diagnoses:  Altered mental status, unspecified altered mental status type    Rx / DC Orders ED Discharge Orders      None         Carlisle Cater, PA-C 05/24/22 [place], Gwenyth Allegra, MD 05/25/22 2130

## 2022-05-24 NOTE — Assessment & Plan Note (Signed)
Patient with 3:1 AVB with pacemaker. She is noted to be tachycardic on telemetry but is asymptomatic  Plan Add BB - coreg  No indication for continuous telemetry

## 2022-05-24 NOTE — ED Triage Notes (Signed)
Patient prsents to ed vial [facility] from home of which she lives with her husband. He states she wasn't acting her normal self today, Strong odor of urine she was also incont of which she states is new for her.

## 2022-05-24 NOTE — Assessment & Plan Note (Signed)
Patient appears well compensated with normal pulmonary exam. Last ECHO 10/20/19 with EF 50-55%, grade I DD.  Plan BNP-if elevated resume lasix  ECHO

## 2022-05-24 NOTE — ED Notes (Signed)
EDP updated on patient heart rate and given new EKGs.

## 2022-05-24 NOTE — Assessment & Plan Note (Signed)
Slightly low albumin and total protein. She has been drinking boost at home.  Plan Continue calorie supplement with glucerna or equivalent  RD consult

## 2022-05-25 ENCOUNTER — Telehealth: Payer: Self-pay

## 2022-05-25 ENCOUNTER — Observation Stay (HOSPITAL_COMMUNITY): Payer: PPO

## 2022-05-25 DIAGNOSIS — G9341 Metabolic encephalopathy: Secondary | ICD-10-CM | POA: Diagnosis not present

## 2022-05-25 DIAGNOSIS — R29818 Other symptoms and signs involving the nervous system: Secondary | ICD-10-CM | POA: Diagnosis not present

## 2022-05-25 LAB — BRAIN NATRIURETIC PEPTIDE: B Natriuretic Peptide: 44.5 pg/mL (ref 0.0–100.0)

## 2022-05-25 MED ORDER — ENOXAPARIN SODIUM 40 MG/0.4ML IJ SOSY
40.0000 mg | PREFILLED_SYRINGE | INTRAMUSCULAR | Status: DC
  Administered 2022-05-25 – 2022-05-28 (×4): 40 mg via SUBCUTANEOUS
  Filled 2022-05-25 (×4): qty 0.4

## 2022-05-25 NOTE — Care Management Obs Status (Signed)
[facility] NOTIFICATION   Patient Details  Name: Abree AERALYN NEWBANKS MRN: [id number] Date of Birth: [birth year]   Medicare Observation Status Notification Given:  Yes    Levonne Lapping, RN 05/25/2022, 1:19 PM

## 2022-05-25 NOTE — ED Notes (Signed)
ED TO INPATIENT HANDOFF REPORT  ED Nurse Name and Phone #: [phone]  S Name/Age/Gender Mandy Durham 76 y.o. female Room/Bed: 041C/041C  Code Status   Code Status: Full Code  Home/SNF/Other Home Patient oriented to: self and place Is this baseline? Yes   Triage Complete: Triage complete  Chief Complaint Encephalopathy, metabolic 99991111  Triage Note Patient prsents to ed vial [facility] from home of which she lives with her husband. He states she wasn't acting her normal self today, Strong odor of urine she was also incont of which she states is new for her.    Allergies No Known Allergies  Level of Care/Admitting Diagnosis ED Disposition     ED Disposition  Admit   Condition  --   Comment  Hospital Area: [facility] [[id number]]  Level of Care: Med-Surg [[id number]]  May admit patient to [facility] or [facility] if equivalent level of care is available:: No  Covid Evaluation: Asymptomatic - no recent exposure (last 10 days) testing not required  Diagnosis: Encephalopathy, metabolic 123456  Admitting Physician: Neena Rhymes [[id number]]  Attending Physician: Neena Rhymes [id number]  Certification:: I certify this patient will need inpatient services for at least 2 midnights  Estimated Length of Stay: 3          B Medical/Surgery History Past Medical History:  Diagnosis Date   Anxiety state, unspecified    Hyperlipidemia    Hypertension    Insomnia    LBBB (left bundle branch block)    LHC in 2002 showed normal coronaries.    Osteoarthrosis, unspecified whether generalized or localized, unspecified site    Overweight(278.02)    Parkinson disease    S/P placement of cardiac pacemaker    a. 02/21/16: Medtronic Advisa DR MRI SureScan model [id number] (serial number [id number])    Serrated adenoma of colon 2007   Symptomatic menopausal or female climacteric states    Syncope and collapse    11/12: Holter 12/12 with rare PACS, HR range 64-140, average  82, no significant arrhythmias. Echo (1/13): EF 50-55%, mild LVH, septal-lateral dyssynchrony c/w LBBB. 3-week event monitor (1/13): No significant arrhythmia.    Ventricular tachycardia ([place]) 05/24/2022   Patient with 3:1 AVB with pacemaker. She is noted to be tachycardic on telemetry but is asymptomatic  Plan   Past Surgical History:  Procedure Laterality Date   COLONOSCOPY  2007   EP IMPLANTABLE DEVICE N/A 02/21/2016   Procedure: Pacemaker Implant;  Surgeon: Will Meredith Leeds, MD;  Location: [facility];  Service: Cardiovascular;  Laterality: N/A;   POLYPECTOMY       A IV Location/Drains/Wounds Patient Lines/Drains/Airways Status     Active Line/Drains/Airways     Name Placement date Placement time Site Days   Peripheral IV 05/24/22 22 G 1" Anterior;Proximal;Right Forearm 05/24/22  1034  Forearm  1            Intake/Output Last 24 hours No intake or output data in the 24 hours ending 05/25/22 1032  Labs/Imaging Results for orders placed or performed during the hospital encounter of 05/24/22 (from the past 48 hour(s))  Lactic acid, plasma     Status: None   Collection Time: 05/24/22 10:26 AM  Result Value Ref Range   Lactic Acid, Venous 1.0 0.5 - 1.9 mmol/L    Comment: Performed at [facility] [address], [place], [place] [address]  Comprehensive metabolic panel     Status: Abnormal   Collection  Time: 05/24/22 10:26 AM  Result Value Ref Range   Sodium 140 135 - 145 mmol/L   Potassium 3.7 3.5 - 5.1 mmol/L   Chloride 106 98 - 111 mmol/L   CO2 22 22 - 32 mmol/L   Glucose, Bld 106 (H) 70 - 99 mg/dL    Comment: Glucose reference range applies only to samples taken after fasting for at least 8 hours.   BUN 28 (H) 8 - 23 mg/dL   Creatinine, Ser 0.84 0.44 - 1.00 mg/dL   Calcium 9.2 8.9 - 10.3 mg/dL   Total Protein 6.4 (L) 6.5 - 8.1 g/dL   Albumin 3.5 3.5 - 5.0 g/dL   AST 23 15 - 41 U/L   ALT 14 0 - 44 U/L   Alkaline Phosphatase 64 38 - 126 U/L    Total Bilirubin 0.8 0.3 - 1.2 mg/dL   GFR, Estimated >60 >60 mL/min    Comment: (NOTE) Calculated using the CKD-EPI Creatinine Equation (2021)    Anion gap 12 5 - 15    Comment: Performed at [facility] [address], [place], [place] [address]  CBC with Differential     Status: None   Collection Time: 05/24/22 10:26 AM  Result Value Ref Range   WBC 6.1 4.0 - 10.5 K/uL   RBC 4.67 3.87 - 5.11 MIL/uL   Hemoglobin 13.3 12.0 - 15.0 g/dL   HCT 40.8 36.0 - 46.0 %   MCV 87.4 80.0 - 100.0 fL   MCH 28.5 26.0 - 34.0 pg   MCHC 32.6 30.0 - 36.0 g/dL   RDW 15.2 11.5 - 15.5 %   Platelets 297 150 - 400 K/uL   nRBC 0.0 0.0 - 0.2 %   Neutrophils Relative % 62 %   Neutro Abs 3.9 1.7 - 7.7 K/uL   Lymphocytes Relative 26 %   Lymphs Abs 1.6 0.7 - 4.0 K/uL   Monocytes Relative 9 %   Monocytes Absolute 0.5 0.1 - 1.0 K/uL   Eosinophils Relative 1 %   Eosinophils Absolute 0.1 0.0 - 0.5 K/uL   Basophils Relative 1 %   Basophils Absolute 0.0 0.0 - 0.1 K/uL   Immature Granulocytes 1 %   Abs Immature Granulocytes 0.03 0.00 - 0.07 K/uL    Comment: Performed at [facility], [address], [place], [place] [address]  Urinalysis, w/ Reflex to Culture (Infection Suspected) -Urine, Clean Catch     Status: Abnormal   Collection Time: 05/24/22 10:31 AM  Result Value Ref Range   Specimen Source URINE, CLEAN CATCH    Color, Urine YELLOW YELLOW   APPearance CLEAR CLEAR   Specific Gravity, Urine >1.030 (H) 1.005 - 1.030   pH 7.0 5.0 - 8.0   Glucose, UA NEGATIVE NEGATIVE mg/dL   Hgb urine dipstick NEGATIVE NEGATIVE   Bilirubin Urine SMALL (A) NEGATIVE   Ketones, ur NEGATIVE NEGATIVE mg/dL   Protein, ur NEGATIVE NEGATIVE mg/dL   Nitrite NEGATIVE NEGATIVE   Leukocytes,Ua NEGATIVE NEGATIVE   Squamous Epithelial / HPF 0-5 0 - 5 /HPF   WBC, UA 0-5 0 - 5 WBC/hpf    Comment: Reflex urine culture not performed if WBC <=10, OR if Squamous epithelial cells >5. If Squamous epithelial cells >5, suggest  recollection.   RBC / HPF 0-5 0 - 5 RBC/hpf   Bacteria, UA RARE (A) NONE SEEN   Urine-Other MICROSCOPIC EXAM PERFORMED ON UNCONCENTRATED URINE     Comment: Performed at [facility], [address] [place], [place] [address]  Resp panel by RT-PCR (RSV, Flu A&B, Covid) Anterior Nasal Swab     Status: None   Collection Time: 05/24/22 10:32 AM   Specimen: Anterior Nasal Swab  Result Value Ref Range   SARS Coronavirus 2 by RT PCR NEGATIVE NEGATIVE   Influenza A by PCR NEGATIVE NEGATIVE   Influenza B by PCR NEGATIVE NEGATIVE    Comment: (NOTE) The Xpert Xpress SARS-CoV-2/FLU/RSV plus assay is intended as an aid in the diagnosis of influenza from Nasopharyngeal swab specimens and should not be used as a sole basis for treatment. Nasal washings and aspirates are unacceptable for Xpert Xpress SARS-CoV-2/FLU/RSV testing.  Fact Sheet for Patients: EntrepreneurPulse.com.au  Fact Sheet for Healthcare Providers: IncredibleEmployment.be  This test is not yet approved or cleared by the [place] FDA and has been authorized for detection and/or diagnosis of SARS-CoV-2 by FDA under an Emergency Use Authorization (EUA). This EUA will remain in effect (meaning this test can be used) for the duration of the COVID-19 declaration under Section 564(b)(1) of the Act, 21 U.S.C. section 360bbb-3(b)(1), unless the authorization is terminated or revoked.     Resp Syncytial Virus by PCR NEGATIVE NEGATIVE    Comment: (NOTE) Fact Sheet for Patients: EntrepreneurPulse.com.au  Fact Sheet for Healthcare Providers: IncredibleEmployment.be  This test is not yet approved or cleared by the [place] FDA and has been authorized for detection and/or diagnosis of SARS-CoV-2 by FDA under an Emergency Use Authorization (EUA). This EUA will remain in effect (meaning this test can be used) for the duration of the COVID-19  declaration under Section 564(b)(1) of the Act, 21 U.S.C. section 360bbb-3(b)(1), unless the authorization is terminated or revoked.  Performed at [facility], [place] [address] [place], [place] [address]   Protime-INR     Status: None   Collection Time: 05/24/22 10:50 AM  Result Value Ref Range   Prothrombin Time 13.8 11.4 - 15.2 seconds   INR 1.1 0.8 - 1.2    Comment: (NOTE) INR goal varies based on device and disease states. Performed at [facility], [place] [address], [place], [place] [address]   APTT     Status: None   Collection Time: 05/24/22 10:50 AM  Result Value Ref Range   aPTT 25 24 - 36 seconds    Comment: Performed at [facility] [address], [place], [place] [address]  Ammonia     Status: None   Collection Time: 05/24/22  4:00 PM  Result Value Ref Range   Ammonia 25 9 - 35 umol/L    Comment: Performed at [facility], [place] [address] [place], [place] [address]   *Note: Due to a large number of results and/or encounters for the requested time period, some results have not been displayed. A complete set of results can be found in Results Review.   CT HEAD WO CONTRAST (5MM)  Result Date: 05/24/2022 CLINICAL DATA:  77 year old female with altered mental status. 2016 history of fall with right frontotemporal hemorrhagic contusions, intracranial hemorrhage. EXAM: CT HEAD WITHOUT CONTRAST TECHNIQUE: Contiguous axial images were obtained from the base of the skull through the vertex without intravenous contrast. RADIATION DOSE REDUCTION: This exam was performed according to the departmental dose-optimization program which includes automated exposure control, adjustment of the mA and/or kV according to patient size and/or use of iterative reconstruction technique. COMPARISON:  Head CT levin 2817. FINDINGS: Brain: Chronic encephalomalacia in the right inferior frontal  gyrus, right insula and operculum. Mild generalized cerebral volume loss since 2017. No  superimposed No midline shift, ventriculomegaly, mass effect, evidence of mass lesion, intracranial hemorrhage or evidence of cortically based acute infarction. Otherwise mild for age patchy bilateral white matter hypodensity. Vascular: Calcified atherosclerosis at the skull base. No suspicious intracranial vascular hyperdensity. Skull: No acute osseous abnormality identified. Sinuses/Orbits: Minimal new paranasal sinus mucosal thickening since 2017. No sinus fluid levels. Tympanic cavities and mastoids remain clear. Other: Postoperative changes to both globes since the prior CT. No acute orbit or scalp soft tissue finding. IMPRESSION: 1. No acute intracranial abnormality. 2. Stable chronic posttraumatic right frontotemporal encephalomalacia. Mild for age cerebral white matter changes. Electronically Signed   By: Genevie Ann M.D.   On: 05/24/2022 11:59   DG Chest Port 1 View  Result Date: 05/24/2022 CLINICAL DATA:  Questionable sepsis. EXAM: PORTABLE CHEST 1 VIEW COMPARISON:  None Available. FINDINGS: 1052 hours. Lungs are hyperexpanded. The lungs are clear without focal pneumonia, edema, pneumothorax or pleural effusion. The cardiopericardial silhouette is within normal limits for size. The visualized bony structures of the thorax are unremarkable. Telemetry leads overlie the chest. Left permanent pacemaker again noted. IMPRESSION: Hyperexpansion without acute cardiopulmonary findings. Electronically Signed   By: Misty Stanley M.D.   On: 05/24/2022 11:02    Pending Labs Unresulted Labs (From admission, onward)     Start     Ordered   05/31/22 0500  Creatinine, serum  (enoxaparin (LOVENOX)    CrCl >/= 30 ml/min)  Weekly,   R     Comments: while on enoxaparin therapy    05/24/22 1823   05/24/22 1820  Brain natriuretic peptide  Add-on,   AD        05/24/22 1823            Vitals/Pain Today's Vitals   05/25/22 0500 05/25/22 0606 05/25/22 0700 05/25/22 0745  BP: (!) 147/67 (!) 151/74 (!) 147/69    Pulse: 64  60   Resp:  10 12   Temp:    98.6 F (37 C)  TempSrc:    Axillary  SpO2: 99%  99%   Weight:      Height:      PainSc:        Isolation Precautions No active isolations  Medications Medications  acetaminophen (TYLENOL) tablet 500 mg (has no administration in time range)  aspirin EC tablet 81 mg (81 mg Oral Given 05/25/22 0915)  ezetimibe (ZETIA) tablet 10 mg (10 mg Oral Given 05/25/22 0915)  losartan (COZAAR) tablet 100 mg (100 mg Oral Given 05/25/22 0915)  nitroGLYCERIN (NITROSTAT) SL tablet 0.4 mg (has no administration in time range)  escitalopram (LEXAPRO) tablet 10 mg (10 mg Oral Given 05/25/22 0915)  baclofen (LIORESAL) tablet 10 mg (10 mg Oral Given 05/24/22 2235)  carbidopa-levodopa (SINEMET IR) 25-100 MG per tablet immediate release 1 tablet (1 tablet Oral Given 05/25/22 0915)  clonazepam (KLONOPIN) disintegrating tablet 0.25 mg (0.25 mg Oral Given 05/24/22 1930)  cholecalciferol (VITAMIN D3) 25 MCG (1000 UNIT) tablet 1,000 Units (has no administration in time range)  albuterol (PROVENTIL) (2.5 MG/3ML) 0.083% nebulizer solution 2.5 mg (has no administration in time range)  enoxaparin (LOVENOX) injection 30 mg (30 mg Subcutaneous Given 05/24/22 1931)  0.45 % sodium chloride infusion (0 mLs Intravenous Stopped 05/25/22 0725)  feeding supplement (GLUCERNA SHAKE) (GLUCERNA SHAKE) liquid 237 mL (237 mLs Oral Not Given 05/25/22 1031)  fluticasone furoate-vilanterol (BREO ELLIPTA) 200-25 MCG/ACT 1 puff (1 puff Inhalation Not  Given 05/25/22 1031)  umeclidinium bromide (INCRUSE ELLIPTA) 62.5 MCG/ACT 1 puff (1 puff Inhalation Not Given 05/25/22 1032)    Mobility walks with device     Focused Assessments Neuro Assessment Handoff:  Swallow screen pass? Yes          Neuro Assessment: Exceptions to WDL Neuro Checks:      Has TPA been given? No If patient is a Neuro Trauma and patient is going to OR before floor call report to [place] nurse: [phone] or  [phone]   R Recommendations: See Admitting Provider Note  Report given to:   Additional Notes:

## 2022-05-25 NOTE — Evaluation (Signed)
Occupational Therapy Evaluation Patient Details Name: Mandy Durham MRN: [id number] DOB: 06-06-1945 Today's Date: 05/25/2022   History of Present Illness Mandy Durham, a 77 y/o who presented due to increased confusion with urinary symptoms. Work up demonstrates U/A concentrated w/o pyuria. PMHx:  HFpEF, Parkinson's disease, HTN, HLD, orthostatic hypotension/autonomic dysfunction, h/o TBI, SAH, recent admission 03/2022 for COVID   Clinical Impression   Mandy Durham was evaluated s/p the above admission list. She was home from SNF for 1 week with general decline in ADLs and mobility, however per chart review she is typically mod I at baseline. Upon evaluation she was limited by impaired cognition, LUE tremor, generalized weakness, unsteady balance, incontinence, and decreased activity tolerance. Overall she required mod A for bed mobility, min A for transfers and close min G for lateral stepping with RW. Due to the deficits listed below, she also requires up to max A for ADLs. Pt will benefit from continued acute OT services. Recommend d/c to SNF unless pt is able to progress acutely to a functional level that family can safely manage at home.       Recommendations for follow up therapy are one component of a multi-disciplinary discharge planning process, led by the attending physician.  Recommendations may be updated based on patient status, additional functional criteria and insurance authorization.   Follow Up Recommendations  Skilled nursing-short term rehab (<3 hours/day) (unless pt progresses acutely, or family can assist at current functional level.)     Assistance Recommended at Discharge Frequent or constant Supervision/Assistance  Patient can return home with the following A lot of help with walking and/or transfers;A lot of help with bathing/dressing/bathroom;Assistance with cooking/housework;Direct supervision/assist for medications management;Direct supervision/assist for financial  management;Assist for transportation;Help with stairs or ramp for entrance;Assistance with feeding    Functional Status Assessment  Patient has had a recent decline in their functional status and demonstrates the ability to make significant improvements in function in a reasonable and predictable amount of time.  Equipment Recommendations  Other (comment) (defer)       Precautions / Restrictions Precautions Precautions: Fall Restrictions Weight Bearing Restrictions: No      Mobility Bed Mobility Overal bed mobility: Needs Assistance Bed Mobility: Rolling, Sidelying to Sit, Sit to Supine Rolling: Mod assist Sidelying to sit: Mod assist   Sit to supine: Max assist        Transfers Overall transfer level: Needs assistance Equipment used: Rolling walker (2 wheels) Transfers: Sit to/from Stand, Bed to chair/wheelchair/BSC Sit to Stand: Min assist     Step pivot transfers: Min guard     General transfer comment: tolerated standign ~5 minutes, lateral stepping at the bed side with min G and RW      Balance Overall balance assessment: Needs assistance Sitting-balance support: Feet supported Sitting balance-Leahy Scale: Fair Sitting balance - Comments: close guard   Standing balance support: Bilateral upper extremity supported, During functional activity Standing balance-Leahy Scale: Poor                             ADL either performed or assessed with clinical judgement   ADL Overall ADL's : Needs assistance/impaired Eating/Feeding: Minimal assistance;Sitting   Grooming: Minimal assistance;Sitting   Upper Body Bathing: Moderate assistance;Sitting   Lower Body Bathing: Maximal assistance;Sit to/from stand   Upper Body Dressing : Minimal assistance;Sitting   Lower Body Dressing: Maximal assistance;Sit to/from stand   Toilet Transfer: Minimal assistance;Stand-pivot;BSC/3in1;Rolling walker (2 wheels)   Toileting-  Clothing Manipulation and Hygiene:  Maximal assistance;Sit to/from stand       Functional mobility during ADLs: Minimal assistance;Rolling walker (2 wheels) General ADL Comments: limited by generalized weakness, impiared communicaiton, LUE tremors     Vision Baseline Vision/History: 0 No visual deficits Vision Assessment?: No apparent visual deficits     Perception Perception Perception Tested?: No   Praxis Praxis Praxis tested?: Not tested    Pertinent Vitals/Pain Pain Assessment Pain Assessment: No/denies pain     Hand Dominance Right   Extremity/Trunk Assessment Upper Extremity Assessment Upper Extremity Assessment: Generalized weakness   Lower Extremity Assessment Lower Extremity Assessment: Defer to PT evaluation   Cervical / Trunk Assessment Cervical / Trunk Assessment: Kyphotic   Communication Communication Communication: Expressive difficulties (stuttered speech)   Cognition Arousal/Alertness: Awake/alert Behavior During Therapy: Anxious Overall Cognitive Status: Difficult to assess                                 General Comments: followed all simple commands - difficult to assess due to stuttered speech     General Comments  VSS, pt soiled upon arrival - cleaned and replaced linens    Exercises     Shoulder Instructions      Home Living Family/patient expects to be discharged to:: Private residence Living Arrangements: Spouse/significant other Available Help at Discharge: Family;Available 24 hours/day Type of Home: House Home Access: Stairs to enter [employer] of Steps: 3 Entrance Stairs-Rails: Left Home Layout: One level     Bathroom Shower/Tub: Walk-in Hydrologist: Handicapped height Bathroom Accessibility: Yes   Home Equipment: Rollator (4 wheels);Shower seat   Additional Comments: gleaned from admission 03/2022      Prior Functioning/Environment Prior Level of Function : Independent/Modified Independent              Mobility Comments: prior to 03/2022 pt was generally  mod I ADLs Comments: Pt recently d/c'd from SNF, was home for 1 week, per chart husband reports she was not participating in her care        OT Problem List: Decreased strength;Decreased range of motion;Impaired balance (sitting and/or standing);Decreased activity tolerance;Decreased coordination;Decreased safety awareness;Decreased knowledge of use of DME or AE;Decreased knowledge of precautions;Pain;Impaired UE functional use      OT Treatment/Interventions: Self-care/ADL training;Therapeutic exercise;DME and/or AE instruction;Therapeutic activities;Patient/family education;Balance training    OT Goals(Current goals can be found in the care plan section) Acute Rehab OT Goals Patient Stated Goal: to eat OT Goal Formulation: With patient Time For Goal Achievement: 06/08/22 Potential to Achieve Goals: Good ADL Goals Pt Will Perform Grooming: with set-up;sitting Pt Will Perform Upper Body Dressing: with set-up;sitting Pt Will Perform Lower Body Dressing: with min assist;sit to/from stand Pt Will Transfer to Toilet: with supervision;ambulating  OT Frequency: Min 2X/week    Co-evaluation              AM-PAC OT "6 Clicks" Daily Activity     Outcome Measure Help from another person eating meals?: A Little Help from another person taking care of personal grooming?: A Lot Help from another person toileting, which includes using toliet, bedpan, or urinal?: A Little Help from another person bathing (including washing, rinsing, drying)?: A Lot Help from another person to put on and taking off regular upper body clothing?: A Little Help from another person to put on and taking off regular lower body clothing?: A Lot 6 Click Score: 15  End of Session Equipment Utilized During Treatment: Rolling walker (2 wheels);Gait belt Nurse Communication: Mobility status (pt woudl like food)  Activity Tolerance: Patient tolerated treatment  well Patient left: in bed;with call bell/phone within reach  OT Visit Diagnosis: Other abnormalities of gait and mobility (R26.89);Unsteadiness on feet (R26.81);Muscle weakness (generalized) (M62.81);Pain                Time: [id number] OT Time Calculation (min): 20 min Charges:  OT General Charges $OT Visit: 1 Visit OT Evaluation $OT Eval Moderate Complexity: 1 Mod  Shade Flood, OTR/L [place] [facility] [place] Communication Preferred   Elliot Cousin 05/25/2022, 11:50 AM

## 2022-05-25 NOTE — ED Notes (Signed)
Pt wet clean linens gown and brief applied pericare provided and warm blanket placed

## 2022-05-25 NOTE — Telephone Encounter (Signed)
   Telephone encounter was:  Successful.  05/25/2022 Name: Avelina RHAYA COALE MRN: [id number] DOB: 10-31-1945  Stephani FAREEDAH MAHLER is a 77 y.o. year old female who is a primary care patient of Larose Kells, Alda Berthold, MD . The community resource team was consulted for assistance with [place] guide performed the following interventions: Patient provided with information about care guide support team and interviewed to confirm resource needs.Meals on wheels referral was added to [facility]  Follow Up Plan:  No further follow up planned at this time. The patient has been provided with needed resources.    [facility] [phone] [address], [place], [place] [address] Phone: [phone] Email: [email or web address]

## 2022-05-25 NOTE — Care Management CC44 (Signed)
Condition Code 44 Documentation Completed  Patient Details  Name: Mandy Durham MRN: [id number] Date of Birth: 1945/06/07   Condition Code 44 given:  Yes Patient signature on Condition Code 44 notice:  Yes Documentation of 2 MD's agreement:  Yes Code 44 added to claim:  Yes    Levonne Lapping, RN 05/25/2022, 1:19 PM

## 2022-05-25 NOTE — Progress Notes (Signed)
PROGRESS NOTE    Jettie RUBIE KAUFHOLD  [id number] DOB: Jan 07, 1946 DOA: 05/24/2022 PCP: Colon Branch, MD   Brief Narrative:  Mrs. Lotz, a 77 y/o followed for HFpEF, Parkinson's disease, HTN, HLD, orthostatic hypotension/autonomic dysfunction, h/o TBI, SAH. In January she was hospitalized with Covid 19. Due to weakness and dibilitation she was discharged to a SNF. Approximately a week ago she was able to return home. She saw Dr. Larose Kells, her PCP, 05/19/22 where the issues was significant confusion, inability to manage her medications, continued malnutrition. Her husband reports that her confusion, which had cleared in the SNF was much worse over the past 3 days. She has not been drinking fluids but he says she eats well, she has been incontinent and less mobile. He reports she has been warm to touch but no documented fever. Due to her confusion and weakness she presents to [facility] for evaluation.     Assessment & Plan:   Principal Problem:   Encephalopathy, metabolic Active Problems:   Ventricular tachycardia (HCC)   Dehydration   HLD (hyperlipidemia)   Anxiety   Borderline diabetes   Orthostatic hypotension   Essential hypertension   Diastolic CHF (HCC)   Atypical parkinsonism   Malnutrition of moderate degree   Metabolic encephalopathy  Acute metabolic encephalopathy Rule out sundowning vs delirium Questionable polypharmacy Patient with significant confusion that started with last hospitalization for covid but reportedly resolved while at [facility]. Was at baseline at home with rather acute change in mental status  Hold home baclofen and clonazepam -would suspect these medications are likely the cause or at least playing a role in patient's mental status -would recommend discontinuing these at discharge if feasible CT head and MRI was negative for stroke No overt infection, review of systems negative per patient and family prior to this event Ammonia within normal limits Lactic acid negative;  respiratory panel negative Patient currently on multiple inhaled medications not noted on prior med rec, she carries no diagnosis of COPD or asthma at the time of her last discharge. These will be discontinued  Failure to thrive, moderate malnutrition Poor PO intake Hypovolemia Completed IVF resuscitation overnight -hold off on aggressive fluids given diastolic dysfunction heart failure as below Patient without overt AKI  Advance diet as tolerated -if p.o. intake is not improving could resume IV fluids   Atypical parkinsonism Patient followed by Internal medicine and neurology, continues to have progressive symptoms At baseline she has a chronic stutter(since childhood per husband) Underlying Parkinson's dementia is also likely ongoing and progressing. Continue Carbidopa-levodopa PT OT and speech to follow   Diastolic CHF ([place]), without acute exacerbation Appears euvolemic today after IV fluids, discontinued as above Repeat echocardiogram ordered although unlikely to change clinical course in the setting of encephalopathy  Ventricular tachycardia [facility]) Patient with 3:1 AVB with pacemaker.  Continue carvedilol   Essential hypertension Continue home losartan   Chronic orthostatic hypotension Likely in the setting of parkinsonism, abdominal binder as appropriate   Borderline diabetes Last A1C 01/28/22 was 6.6%.  Diet controlled, continue carb restricted diet   Anxiety Long standing problem - clonazepam discontinued Discussed discontinuing clonazepam given patient's age and risk factors Continue Lexapro    HLD (hyperlipidemia) Continue Zetia  DVT prophylaxis: Lovenox Code Status: Full Family Communication: Husband and daughter updated over the phone  Status is: Observation  Dispo: The patient is from: Home              Anticipated d/c is to: To be determined  Anticipated d/c date is: 24-48h              Patient currently NOT medically stable for  discharge  Consultants:  None  Procedures:  None  Antimicrobials:  None indicated   Subjective: No acute issues/events overnight -somewhat improving per family at bedside, review of systems limited per patient but denies nausea vomiting diarrhea constipation headache fevers chills or chest pain.  Objective: Vitals:   05/25/22 0500 05/25/22 0606 05/25/22 0700 05/25/22 0745  BP: (!) 147/67 (!) 151/74 (!) 147/69   Pulse: 64  60   Resp:  10 12   Temp:    98.6 F (37 C)  TempSrc:    Axillary  SpO2: 99%  99%   Weight:      Height:       No intake or output data in the 24 hours ending 05/25/22 0824 Filed Weights   05/24/22 1021  Weight: 48.5 kg    Examination:  General:  Pleasantly resting in bed, No acute distress.  Somnolent but arousable, alert and oriented to person place and situation HEENT:  Normocephalic atraumatic.  Sclerae nonicteric, noninjected.  Extraocular movements intact bilaterally. Neck:  Without mass or deformity.  Trachea is midline. Lungs:  Clear to auscultate bilaterally without rhonchi, wheeze, or rales. Heart:  Regular rate and rhythm.  Without murmurs, rubs, or gallops. Abdomen:  Soft, nontender, nondistended.  Without guarding or rebound. Extremities: Without cyanosis, clubbing, edema, or obvious deformity. Vascular:  Dorsalis pedis and posterior tibial pulses palpable bilaterally. Skin:  Warm and dry, no erythema, no ulcerations.   Data Reviewed: I have personally reviewed following labs and imaging studies  CBC: Recent Labs  Lab 05/24/22 1026  WBC 6.1  NEUTROABS 3.9  HGB 13.3  HCT 40.8  MCV 87.4  PLT 123XX123   Basic Metabolic Panel: Recent Labs  Lab 05/24/22 1026  NA 140  K 3.7  CL 106  CO2 22  GLUCOSE 106*  BUN 28*  CREATININE 0.84  CALCIUM 9.2   GFR: Estimated Creatinine Clearance: 42.9 mL/min (by C-G formula based on SCr of 0.84 mg/dL). Liver Function Tests: Recent Labs  Lab 05/24/22 1026  AST 23  ALT 14  ALKPHOS 64   BILITOT 0.8  PROT 6.4*  ALBUMIN 3.5   Recent Labs  Lab 05/24/22 1600  AMMONIA 25   Coagulation Profile: Recent Labs  Lab 05/24/22 1050  INR 1.1   Sepsis Labs: Recent Labs  Lab 05/24/22 1026  LATICACIDVEN 1.0    Recent Results (from the past 240 hour(s))  Resp panel by RT-PCR (RSV, Flu A&B, Covid) Anterior Nasal Swab     Status: None   Collection Time: 05/24/22 10:32 AM   Specimen: Anterior Nasal Swab  Result Value Ref Range Status   SARS Coronavirus 2 by RT PCR NEGATIVE NEGATIVE Final   Influenza A by PCR NEGATIVE NEGATIVE Final   Influenza B by PCR NEGATIVE NEGATIVE Final    Comment: (NOTE) The Xpert Xpress SARS-CoV-2/FLU/RSV plus assay is intended as an aid in the diagnosis of influenza from Nasopharyngeal swab specimens and should not be used as a sole basis for treatment. Nasal washings and aspirates are unacceptable for Xpert Xpress SARS-CoV-2/FLU/RSV testing.  Fact Sheet for Patients: EntrepreneurPulse.com.au  Fact Sheet for Healthcare Providers: IncredibleEmployment.be  This test is not yet approved or cleared by the [place] FDA and has been authorized for detection and/or diagnosis of SARS-CoV-2 by FDA under an Emergency Use Authorization (EUA). This EUA will remain in effect (  meaning this test can be used) for the duration of the COVID-19 declaration under Section 564(b)(1) of the Act, 21 U.S.C. section 360bbb-3(b)(1), unless the authorization is terminated or revoked.     Resp Syncytial Virus by PCR NEGATIVE NEGATIVE Final    Comment: (NOTE) Fact Sheet for Patients: EntrepreneurPulse.com.au  Fact Sheet for Healthcare Providers: IncredibleEmployment.be  This test is not yet approved or cleared by the [place] FDA and has been authorized for detection and/or diagnosis of SARS-CoV-2 by FDA under an Emergency Use Authorization (EUA). This EUA will remain in effect  (meaning this test can be used) for the duration of the COVID-19 declaration under Section 564(b)(1) of the Act, 21 U.S.C. section 360bbb-3(b)(1), unless the authorization is terminated or revoked.  Performed at [facility], [place] [address], [place], [place] [address]     Radiology Studies: CT HEAD WO CONTRAST (5MM)  Result Date: 05/24/2022 CLINICAL DATA:  77 year old female with altered mental status. 2016 history of fall with right frontotemporal hemorrhagic contusions, intracranial hemorrhage. EXAM: CT HEAD WITHOUT CONTRAST TECHNIQUE: Contiguous axial images were obtained from the base of the skull through the vertex without intravenous contrast. RADIATION DOSE REDUCTION: This exam was performed according to the departmental dose-optimization program which includes automated exposure control, adjustment of the mA and/or kV according to patient size and/or use of iterative reconstruction technique. COMPARISON:  Head CT levin 2817. FINDINGS: Brain: Chronic encephalomalacia in the right inferior frontal gyrus, right insula and operculum. Mild generalized cerebral volume loss since 2017. No superimposed No midline shift, ventriculomegaly, mass effect, evidence of mass lesion, intracranial hemorrhage or evidence of cortically based acute infarction. Otherwise mild for age patchy bilateral white matter hypodensity. Vascular: Calcified atherosclerosis at the skull base. No suspicious intracranial vascular hyperdensity. Skull: No acute osseous abnormality identified. Sinuses/Orbits: Minimal new paranasal sinus mucosal thickening since 2017. No sinus fluid levels. Tympanic cavities and mastoids remain clear. Other: Postoperative changes to both globes since the prior CT. No acute orbit or scalp soft tissue finding. IMPRESSION: 1. No acute intracranial abnormality. 2. Stable chronic posttraumatic right frontotemporal encephalomalacia. Mild for age cerebral white matter changes. Electronically Signed   By:  Genevie Ann M.D.   On: 05/24/2022 11:59   DG Chest Port 1 View  Result Date: 05/24/2022 CLINICAL DATA:  Questionable sepsis. EXAM: PORTABLE CHEST 1 VIEW COMPARISON:  None Available. FINDINGS: 1052 hours. Lungs are hyperexpanded. The lungs are clear without focal pneumonia, edema, pneumothorax or pleural effusion. The cardiopericardial silhouette is within normal limits for size. The visualized bony structures of the thorax are unremarkable. Telemetry leads overlie the chest. Left permanent pacemaker again noted. IMPRESSION: Hyperexpansion without acute cardiopulmonary findings. Electronically Signed   By: Misty [place] M.D.   On: 05/24/2022 11:02     Scheduled Meds:  aspirin EC  81 mg Oral Daily   carbidopa-levodopa  1 tablet Oral QID   cholecalciferol  1,000 Units Oral Once per day on Mon Thu   enoxaparin (LOVENOX) injection  30 mg Subcutaneous Q24H   escitalopram  10 mg Oral Daily   ezetimibe  10 mg Oral Daily   feeding supplement (GLUCERNA SHAKE)  237 mL Oral TID BM   fluticasone furoate-vilanterol  1 puff Inhalation Daily   losartan  100 mg Oral Daily   umeclidinium bromide  1 puff Inhalation Daily   Continuous Infusions:   LOS: 1 day   Time spent: 75mn  Joeann Steppe C Abriel Geesey, DO [facility]  If 7PM-7AM, please contact night-coverage [email or web address]  05/25/2022, 8:24 AM

## 2022-05-25 NOTE — Progress Notes (Signed)
PT Cancellation Note  Patient Details Name: Mandy Durham MRN: [id number] DOB: December 16, 1945   Cancelled Treatment:    Reason Eval/Treat Not Completed: Patient at procedure or test/unavailable.  Retry as time and pt allow.   Ramond Dial 05/25/2022, 2:21 PM  Mee Hives, PT PhD Acute Rehab Dept. Number: [place] and [place]

## 2022-05-26 DIAGNOSIS — G9341 Metabolic encephalopathy: Secondary | ICD-10-CM | POA: Diagnosis not present

## 2022-05-26 LAB — CBC
HCT: 37.4 % (ref 36.0–46.0)
Hemoglobin: 12.2 g/dL (ref 12.0–15.0)
MCH: 28.5 pg (ref 26.0–34.0)
MCHC: 32.6 g/dL (ref 30.0–36.0)
MCV: 87.4 fL (ref 80.0–100.0)
Platelets: 290 10*3/uL (ref 150–400)
RBC: 4.28 MIL/uL (ref 3.87–5.11)
RDW: 14.8 % (ref 11.5–15.5)
WBC: 7.5 10*3/uL (ref 4.0–10.5)
nRBC: 0 % (ref 0.0–0.2)

## 2022-05-26 LAB — COMPREHENSIVE METABOLIC PANEL
ALT: 6 U/L (ref 0–44)
AST: 20 U/L (ref 15–41)
Albumin: 3 g/dL — ABNORMAL LOW (ref 3.5–5.0)
Alkaline Phosphatase: 61 U/L (ref 38–126)
Anion gap: 6 (ref 5–15)
BUN: 29 mg/dL — ABNORMAL HIGH (ref 8–23)
CO2: 23 mmol/L (ref 22–32)
Calcium: 8.7 mg/dL — ABNORMAL LOW (ref 8.9–10.3)
Chloride: 108 mmol/L (ref 98–111)
Creatinine, Ser: 0.85 mg/dL (ref 0.44–1.00)
GFR, Estimated: >60 mL/min (ref >60)
Glucose, Bld: 103 mg/dL — ABNORMAL HIGH (ref 70–99)
Potassium: 3.7 mmol/L (ref 3.5–5.1)
Sodium: 137 mmol/L (ref 135–145)
Total Bilirubin: 0.4 mg/dL (ref 0.3–1.2)
Total Protein: 5.6 g/dL — ABNORMAL LOW (ref 6.5–8.1)

## 2022-05-26 LAB — C DIFFICILE QUICK SCREEN W PCR REFLEX
C Diff antigen: NEGATIVE
C Diff interpretation: NOT DETECTED
C Diff toxin: NEGATIVE

## 2022-05-26 MED ORDER — ENSURE ENLIVE PO LIQD
237.0000 mL | Freq: Two times a day (BID) | ORAL | Status: DC
  Administered 2022-05-26 – 2022-05-29 (×6): 237 mL via ORAL

## 2022-05-26 MED ORDER — ADULT MULTIVITAMIN W/MINERALS CH
1.0000 | ORAL_TABLET | Freq: Every day | ORAL | Status: DC
  Administered 2022-05-26 – 2022-05-29 (×4): 1 via ORAL
  Filled 2022-05-26 (×4): qty 1

## 2022-05-26 MED ORDER — MIDAZOLAM HCL 2 MG/2ML IJ SOLN
3.0000 mg | Freq: Once | INTRAMUSCULAR | Status: AC | PRN
  Administered 2022-05-26: 3 mg via INTRAMUSCULAR
  Filled 2022-05-26: qty 4

## 2022-05-26 NOTE — NC FL2 (Signed)
[place] ARB LEVEL OF CARE FORM     IDENTIFICATION  Patient Name: Mandy Durham Birthdate: [birth year] Sex: female Admission Date (Current Location): 05/24/2022  [facility] and [place] Number:  Herbalist and Address:  [facility] [place], [place] [address] [place], [place] [address]      Provider Number: [id number]  Attending Physician Name and Address:  Little Ishikawa, MD  Relative Name and Phone Number:       Current Level of Care: Hospital Recommended Level of Care: [place] Prior Approval Number:    Date Approved/Denied:   PASRR Number: [id number]  Discharge Plan: SNF    Current Diagnoses: Patient Active Problem List   Diagnosis Date Noted   Ventricular tachycardia ([place]) 05/24/2022   Dehydration 123456   Metabolic encephalopathy 123456   Encephalopathy, metabolic 123456   Malnutrition of moderate degree 03/20/2022   COVID-19 03/19/2022   Fluency disorder associated with underlying disease 12/18/2021   Diplopia 12/18/2021   Cervical dystonia 12/18/2021   Dyskinesia 12/18/2021   Neuropathy involving both lower extremities 06/09/2021   Complaints of weakness of lower extremity 06/09/2021   Abnormal brain MRI 06/09/2021   Neck muscle weakness 0000000   Diastolic CHF ([place]) 123456   Kyphosis due to degeneration of spine 05/05/2021   Drug-induced orofacial dyskinesia 05/05/2021   Hyperkinesis 05/05/2021   Traumatic intracerebral hemorrhage with loss of consciousness of 31 minutes to 59 minutes ([place]) 05/05/2021   Stammering and stuttering 05/05/2021   Atypical parkinsonism 09/07/2018   Functional dyspepsia 09/08/2017   Slow transit constipation 09/08/2017   S/P placement of cardiac pacemaker    [place] II 02/20/2016   Nystagmus, end-position 02/12/2016   Dizziness and giddiness 02/12/2016   Flapping tremor 02/12/2016   Chronic fatigue 10/28/2015   PCP NOTES >>>> 02/13/2015   TBI  (traumatic brain injury) ([place]) 01/28/2015   Essential hypertension    Acute post-traumatic headache, not intractable    Vertigo due to brain injury ([place])    [place] (subarachnoid hemorrhage) ([place]) 01/21/2015   Annual physical exam 09/07/2014   Orthostatic hypotension 08/30/2014   Acromioclavicular arthrosis 03/21/2014   Allergic rhinitis 02/27/2014   SI (sacroiliac) joint dysfunction 02/02/2014   Gastrocnemius strain, left 12/11/2013   Back pain 08/22/2013   LBBB (left bundle branch block) 05/13/2013   Borderline diabetes    Insomnia 01/12/2008   HLD (hyperlipidemia) 02/04/2007   Anxiety 10/12/2006   Osteoarthritis 10/12/2006    Orientation RESPIRATION BLADDER Height & Weight     Self, Place, Time  Normal Incontinent Weight: 107 lb (48.5 kg) Height:  '5\' 7"'$  (170.2 cm)  BEHAVIORAL SYMPTOMS/MOOD NEUROLOGICAL BOWEL NUTRITION STATUS      Incontinent Diet (See dc summary)  AMBULATORY STATUS COMMUNICATION OF NEEDS Skin   Limited Assist Verbally Normal                       Personal Care Assistance Level of Assistance  Bathing, Feeding, Dressing Bathing Assistance: Limited assistance Feeding assistance: Limited assistance Dressing Assistance: Limited assistance     Functional Limitations Info             [place]  PT (By licensed PT), OT (By licensed OT)     PT Frequency: Eval and treat OT Frequency: Eval and treat            Contractures Contractures Info: Not present    Additional Factors Info  Code Status, Allergies, Psychotropic Code Status  Info: Full Allergies Info: enteric precautions awaiting results Psychotropic Info: Lexapro         Current Medications (05/26/2022):  This is the current hospital active medication list Current Facility-Administered Medications  Medication Dose Route Frequency Provider Last Rate Last Admin   acetaminophen (TYLENOL) tablet 500 mg  500 mg Oral Q6H PRN Norins, Heinz Knuckles, MD       albuterol  (PROVENTIL) (2.5 MG/3ML) 0.083% nebulizer solution 2.5 mg  2.5 mg Nebulization Q2H PRN Norins, Heinz Knuckles, MD       aspirin EC tablet 81 mg  81 mg Oral Daily Norins, Heinz Knuckles, MD   81 mg at 05/26/22 1040   carbidopa-levodopa (SINEMET IR) 25-100 MG per tablet immediate release 1 tablet  1 tablet Oral QID Norins, Heinz Knuckles, MD   1 tablet at 05/26/22 1040   cholecalciferol (VITAMIN D3) 25 MCG (1000 UNIT) tablet 1,000 Units  1,000 Units Oral Once per day on Mon Thu Norins, Michael E, MD   1,000 Units at 05/25/22 0915   enoxaparin (LOVENOX) injection 40 mg  40 mg Subcutaneous Q24H Norins, Heinz Knuckles, MD   40 mg at 05/25/22 1825   escitalopram (LEXAPRO) tablet 10 mg  10 mg Oral Daily Norins, Heinz Knuckles, MD   10 mg at 05/26/22 1040   ezetimibe (ZETIA) tablet 10 mg  10 mg Oral Daily Norins, Heinz Knuckles, MD   10 mg at 05/26/22 1040   feeding supplement (GLUCERNA SHAKE) (GLUCERNA SHAKE) liquid 237 mL  237 mL Oral TID BM Norins, Heinz Knuckles, MD   237 mL at 05/26/22 1040   losartan (COZAAR) tablet 100 mg  100 mg Oral Daily Norins, Heinz Knuckles, MD   100 mg at 05/26/22 1040   nitroGLYCERIN (NITROSTAT) SL tablet 0.4 mg  0.4 mg Sublingual Q5 Min x 3 PRN Norins, Heinz Knuckles, MD         Discharge Medications: Please see discharge summary for a list of discharge medications.  Relevant Imaging Results:  Relevant Lab Results:   Additional Information SSN [id number]  Benard Halsted, LCSW

## 2022-05-26 NOTE — Evaluation (Signed)
Physical Therapy Evaluation Patient Details Name: Mandy Durham MRN: [id number] DOB: 1945-11-03 Today's Date: 05/26/2022  History of Present Illness  [name], a 77 y/o who presented on 3/10 due to increased confusion with urinary symptoms. Work up demonstrates U/A concentrated w/o pyuria. PMHx:  HFpEF, Parkinson's disease, HTN, HLD, orthostatic hypotension/autonomic dysfunction, h/o TBI, SAH, recent admission 03/2022 for COVID  Clinical Impression  Pt presents today with impaired functional mobility, limited by cognition, balance, activity tolerance, and strength. At baseline, pt is modified independent with all mobility with use of a rollator, however pt is currently requiring minA for transfers and ambulation, noting posterior lean with static standing, requiring verbal and visual cues to correct. Pt tolerating ambulation in the room with stable vitals, but fatiguing. Per conversation with RN, pt's husband is available full time but is unsure about his ability to assist pt. At this time, will recommend SNF at discharge to progress pt's mobility and safety prior to returning home. If family feels comfortable taking pt home with minA for OOB mobility, recommend HHPT. Acute PT will continue to follow up with pt during admission to progress mobility.        Recommendations for follow up therapy are one component of a multi-disciplinary discharge planning process, led by the attending physician.  Recommendations may be updated based on patient status, additional functional criteria and insurance authorization.  Follow Up Recommendations Skilled nursing-short term rehab (<3 hours/day) (unless family feels comfortable with pt's level) Can patient physically be transported by private vehicle: Yes    Assistance Recommended at Discharge Frequent or constant Supervision/Assistance  Patient can return home with the following  A little help with walking and/or transfers;Assistance with  cooking/housework;Help with stairs or ramp for entrance;Assist for transportation    Equipment Recommendations None recommended by PT  Recommendations for Other Services       Functional Status Assessment Patient has had a recent decline in their functional status and demonstrates the ability to make significant improvements in function in a reasonable and predictable amount of time.     Precautions / Restrictions Precautions Precautions: Fall Restrictions Weight Bearing Restrictions: No      Mobility  Bed Mobility Overal bed mobility: Needs Assistance Bed Mobility: Supine to Sit   Sidelying to sit: Min guard, HOB elevated       General bed mobility comments: minG for safety and line management, pt performing with use of bed rail    Transfers Overall transfer level: Needs assistance Equipment used: Rolling walker (2 wheels) Transfers: Sit to/from Stand Sit to Stand: Min assist           General transfer comment: assist required for power up and for balance, posterior lean in standing with cueing provided to correct    Ambulation/Gait Ambulation/Gait assistance: Min assist Gait Distance (Feet): 15 Feet Assistive device: Rolling walker (2 wheels) Gait Pattern/deviations: Step-to pattern, Decreased stride length, Trunk flexed Gait velocity: decreased     General Gait Details: pt with step-to gait pattern, mild posterior lean but improving with ambulation and cueing. assist for balance and cueing for RW management in narrow spaces  Stairs            Wheelchair Mobility    Modified Rankin (Stroke Patients Only)       Balance Overall balance assessment: Needs assistance Sitting-balance support: Feet supported, Bilateral upper extremity supported Sitting balance-Leahy Scale: Fair Sitting balance - Comments: close guard Postural control: Posterior lean Standing balance support: Bilateral upper extremity supported, During functional  activity Standing  balance-Leahy Scale: Poor Standing balance comment: assist required for static standing to correct posterior lean, reliant on UE support                             Pertinent Vitals/Pain Pain Assessment Pain Assessment: No/denies pain    Home Living Family/patient expects to be discharged to:: Private residence Living Arrangements: Spouse/significant other Available Help at Discharge: Family;Available 24 hours/day Type of Home: House Home Access: Stairs to enter Entrance Stairs-Rails: Left Entrance Stairs-Number of Steps: 3   Home Layout: One level Home Equipment: Rollator (4 wheels);Shower seat      Prior Function Prior Level of Function : Independent/Modified Independent             Mobility Comments: was mod I with use of rollator prior to January. Pt was discharged in January to a SNF and has been home for 1 week, declining in mobility       Hand Dominance   Dominant Hand: Right    Extremity/Trunk Assessment   Upper Extremity Assessment Upper Extremity Assessment: Defer to OT evaluation    Lower Extremity Assessment Lower Extremity Assessment: Generalized weakness    Cervical / Trunk Assessment Cervical / Trunk Assessment: Kyphotic  Communication   Communication: Expressive difficulties (stutter, per chart review has had since childhood)  Cognition Arousal/Alertness: Awake/alert Behavior During Therapy: WFL for tasks assessed/performed Overall Cognitive Status: No family/caregiver present to determine baseline cognitive functioning                                 General Comments: A&O to self and place, disoriented to time. Pt following one commands but difficult to fully assess due to stuttered speech. Has a history of TBI and SAH, with potential underlying Parkinsonian dementia per chart review        General Comments General comments (skin integrity, edema, etc.): VSS on room air    Exercises     Assessment/Plan    PT  Assessment Patient needs continued PT services  PT Problem List Decreased strength;Decreased activity tolerance;Decreased balance;Decreased mobility;Decreased cognition       PT Treatment Interventions DME instruction;Gait training;Functional mobility training;Therapeutic activities;Therapeutic exercise;Balance training;Stair training;Neuromuscular re-education;Patient/family education    PT Goals (Current goals can be found in the Care Plan section)  Acute Rehab PT Goals Patient Stated Goal: get better PT Goal Formulation: With patient Time For Goal Achievement: 06/09/22 Potential to Achieve Goals: Good    Frequency Min 3X/week     Co-evaluation               AM-PAC PT "6 Clicks" Mobility  Outcome Measure Help needed turning from your back to your side while in a flat bed without using bedrails?: A Little Help needed moving from lying on your back to sitting on the side of a flat bed without using bedrails?: A Little Help needed moving to and from a bed to a chair (including a wheelchair)?: A Little Help needed standing up from a chair using your arms (e.g., wheelchair or bedside chair)?: A Little Help needed to walk in hospital room?: A Little Help needed climbing 3-5 steps with a railing? : Total 6 Click Score: 16    End of Session Equipment Utilized During Treatment: Gait belt Activity Tolerance: Patient tolerated treatment well Patient left: in chair;with call bell/phone within reach;with chair alarm set Nurse Communication: Mobility status  PT Visit Diagnosis: Muscle weakness (generalized) (M62.81);Difficulty in walking, not elsewhere classified (R26.2);Other abnormalities of gait and mobility (R26.89)    Time: [id number] PT Time Calculation (min) (ACUTE ONLY): 27 min   Charges:   PT Evaluation $PT Eval Moderate Complexity: 1 Mod          Charlynne Cousins, PT DPT Acute Rehabilitation Services Office [phone]   Luvenia Heller 05/26/2022, 3:14 PM

## 2022-05-26 NOTE — Progress Notes (Signed)
Initial Nutrition Assessment  DOCUMENTATION CODES:   Underweight, Non-severe (moderate) malnutrition in context of chronic illness  INTERVENTION:   Multivitamin w/ minerals daily Discontinue Glucerna, replace with Ensure Enlive po BID, each supplement provides 350 kcal and 20 grams of protein. Liberalize diet to regular due to malnutrition Encourage good PO intake  NUTRITION DIAGNOSIS:   Moderate Malnutrition related to chronic illness as evidenced by moderate fat depletion, severe muscle depletion.  GOAL:   Patient will meet greater than or equal to 90% of their needs  MONITOR:   PO intake, Supplement acceptance, Weight trends, I & O's, Labs  REASON FOR ASSESSMENT:   Consult Assessment of nutrition requirement/status  ASSESSMENT:   77 y.o. female presented to the ED with confusion and weakness. PMH includes HLD, CHF, HTN,a nd Parkinson's Disease. Pt admitted with dehydration and metabolic encephalopathy.   Pt up in chair, no family at bedside. Pt reports that her appetite was good at home. RD observed Glucerna at bedside, pt reports that she enjoys them. Pt reports weight loss over the past 6 months, but unable to provide an amount. Weight in chart appears to be pulled from previous admission. RD asked RN to obtain new weight once back in bed, RD zeroed out bed prior to leaving room.   RN reports that pt ate ~50% of her breakfast this morning.   Medications reviewed and include: Vitamin D3 Labs reviewed: Sodium 137, Potassium 3.7, BUN 29, Hgb A1c 6.6% (01/28/22)   NUTRITION - FOCUSED PHYSICAL EXAM:  Flowsheet Row Most Recent Value  Orbital Region Moderate depletion  Upper Arm Region Moderate depletion  Thoracic and Lumbar Region Unable to assess  Buccal Region Moderate depletion  Temple Region Moderate depletion  Clavicle Bone Region Severe depletion  Clavicle and Acromion Bone Region Severe depletion  Scapular Bone Region Severe depletion  Dorsal Hand Severe  depletion  Patellar Region Severe depletion  Anterior Thigh Region Severe depletion  Posterior Calf Region Severe depletion  Edema (RD Assessment) None  Hair Reviewed  Eyes Reviewed  Mouth Reviewed  Skin Reviewed  Nails Reviewed   Diet Order:   Diet Order             Diet regular Room service appropriate? Yes with Assist; Fluid consistency: Thin  Diet effective now                  EDUCATION NEEDS:   Not appropriate for education at this time  Skin:  Skin Assessment: Reviewed RN Assessment  Last BM:  3/11 - Type 6  Height:  Ht Readings from Last 1 Encounters:  05/24/22 '5\' 7"'$  (1.702 m)   Weight:  Wt Readings from Last 1 Encounters:  05/24/22 48.5 kg   Ideal Body Weight:  61.4 kg  BMI:  Body mass index is 16.76 kg/m.  Estimated Nutritional Needs:  Kcal:  1500-1700 Protein:  70-90 grams Fluid:  >/= 1.5 L   Hermina Barters RD, LDN Clinical Dietitian See [facility] for contact information.

## 2022-05-26 NOTE — TOC Initial Note (Addendum)
Transition of Care [facility]) - Initial/Assessment Note    Patient Details  Name: Mandy Durham MRN: [id number] Date of Birth: 01-May-1945  Transition of [facility]) CM/SW Contact:    Benard Halsted, LCSW Phone Number: 05/26/2022, 1:14 PM  Clinical Narrative:                 1pm-CSW met with patient's spouse regarding concerns over taking patient home. CSW discussed SNF and that if patient is denied since she was just there, the cost would be private pay up front. Spouse stated he cannot afford that and will have to take her home with home health. CSW discussed maximizing home health services through [facility] and utilizing the long term care benefit through [facility] if needed. CSW also provided a private duty nursing list that spouse stated he can afford. CSW discussed Medicaid application and provided contact info.   CSW discussed with MD and will try for SNF insurance approval once PT note is in. Spouse able to pay copays, just not completely private pay.    3:44pm- CSW met with spouse. He is now in agreement with paying privately if insurance does not approve SNF and stated he will find a way. CSW explained that if insurance approves rehab, then patient will only have to pay copays until rehab is no longer approved (then will be completely out of pocket). He reported understanding. CSW also reminded him of observation status in the hospital and he is in agreement with keeping patient here tonight. He requested [facility], [facility], or [facility].  CSW contacted [employer] and started insurance process for SNF and PTAR.    Expected Discharge Plan: [place]     Patient Goals and CMS Choice Patient states their goals for this hospitalization and ongoing recovery are:: Return home CMS Medicare.gov Compare Post Acute Care list provided to:: Patient Represenative (must comment) Choice offered to / list presented to : [place] ownership interest in [facility].provided to:: Spouse    Expected Discharge Plan and Services In-house Referral: Clinical Social Work   Post Acute Care Choice: [place] arrangements for the past 2 months: [place]                                      Prior Living Arrangements/Services Living arrangements for the past 2 months: Single Family Home Lives with:: Spouse Patient language and need for interpreter reviewed:: Yes Do you feel safe going back to the place where you live?: Yes      Need for Family Participation in Patient Care: Yes (Comment) Care giver support system in place?: Yes (comment) Current home services: DME Criminal Activity/Legal Involvement Pertinent to Current Situation/Hospitalization: No - Comment as needed  Activities of Daily Living Home Assistive Devices/Equipment: Cane (specify quad or straight), Walker (specify type) ADL Screening (condition at time of admission) Patient's cognitive ability adequate to safely complete daily activities?: No Is the patient deaf or have difficulty hearing?: No Does the patient have difficulty seeing, even when wearing glasses/contacts?: No Does the patient have difficulty concentrating, remembering, or making decisions?: Yes Patient able to express need for assistance with ADLs?: Yes Does the patient have difficulty dressing or bathing?: Yes Independently performs ADLs?: No Communication: Independent Dressing (OT): Needs assistance Is this a change from baseline?: Pre-admission baseline Grooming: Needs assistance Is this a change from baseline?: Pre-admission baseline Feeding:  Needs assistance Is this a change from baseline?: Pre-admission baseline Bathing: Needs assistance Is this a change from baseline?: Pre-admission baseline Toileting: Needs assistance Is this a change from baseline?: Pre-admission baseline In/Out Bed: Dependent Is this a change from baseline?: Pre-admission baseline Walks in Home: Needs  assistance Is this a change from baseline?: Pre-admission baseline Does the patient have difficulty walking or climbing stairs?: Yes Weakness of Legs: Both Weakness of Arms/Hands: Both  Permission Sought/Granted Permission sought to share information with : Facility Sport and exercise psychologist, Family Supports Permission granted to share information with : Yes, Verbal Permission Granted  Share Information with NAME: [name]  Permission granted to share info w AGENCY: [facility]  Permission granted to share info w Relationship: Spouse  Permission granted to share info w Contact Information: [phone]  Emotional Assessment Appearance:: Appears stated age Attitude/Demeanor/Rapport: Unable to Assess Affect (typically observed): Pleasant Orientation: : Oriented to Self, Oriented to Place, Oriented to  Time Alcohol / Substance Use: Not Applicable Psych Involvement: No (comment)  Admission diagnosis:  Encephalopathy acute [G93.40] Encephalopathy, metabolic 99991111 Altered mental status, unspecified altered mental status type [R41.82] Patient Active Problem List   Diagnosis Date Noted   Ventricular tachycardia ([place]) 05/24/2022   Dehydration 123456   Metabolic encephalopathy 123456   Encephalopathy, metabolic 123456   Malnutrition of moderate degree 03/20/2022   COVID-19 03/19/2022   Fluency disorder associated with underlying disease 12/18/2021   Diplopia 12/18/2021   Cervical dystonia 12/18/2021   Dyskinesia 12/18/2021   Neuropathy involving both lower extremities 06/09/2021   Complaints of weakness of lower extremity 06/09/2021   Abnormal brain MRI 06/09/2021   Neck muscle weakness 0000000   Diastolic CHF ([place]) 123456   Kyphosis due to degeneration of spine 05/05/2021   Drug-induced orofacial dyskinesia 05/05/2021   Hyperkinesis 05/05/2021   Traumatic intracerebral hemorrhage with loss of consciousness of 31 minutes to 59 minutes ([place]) 05/05/2021   Stammering and  stuttering 05/05/2021   Atypical parkinsonism 09/07/2018   Functional dyspepsia 09/08/2017   Slow transit constipation 09/08/2017   S/P placement of cardiac pacemaker    [place] II 02/20/2016   Nystagmus, end-position 02/12/2016   Dizziness and giddiness 02/12/2016   Flapping tremor 02/12/2016   Chronic fatigue 10/28/2015   PCP NOTES >>>> 02/13/2015   TBI (traumatic brain injury) ([place]) 01/28/2015   Essential hypertension    Acute post-traumatic headache, not intractable    Vertigo due to brain injury ([place])    [place] (subarachnoid hemorrhage) ([place]) 01/21/2015   Annual physical exam 09/07/2014   Orthostatic hypotension 08/30/2014   Acromioclavicular arthrosis 03/21/2014   Allergic rhinitis 02/27/2014   SI (sacroiliac) joint dysfunction 02/02/2014   Gastrocnemius strain, left 12/11/2013   Back pain 08/22/2013   LBBB (left bundle branch block) 05/13/2013   Borderline diabetes    Insomnia 01/12/2008   HLD (hyperlipidemia) 02/04/2007   Anxiety 10/12/2006   Osteoarthritis 10/12/2006   PCP:  Colon Branch, MD Pharmacy:   [facility] [place]), [place] - [id number] [place] [address] Phone: [phone] Fax: [phone]     Social Determinants of Health (SDOH) Social History: SDOH Screenings   Food Insecurity: No Food Insecurity (05/25/2022)  Housing: Low Risk  (05/25/2022)  Transportation Needs: No Transportation Needs (05/25/2022)  Utilities: Not At Risk (05/25/2022)  Alcohol Screen: Low Risk  (04/30/2021)  Depression (PHQ2-9): Medium Risk (01/28/2022)  Financial Resource Strain: Low Risk  (04/30/2021)  Physical Activity: Inactive (04/30/2021)  Social Connections: Moderately Integrated (04/30/2021)  Stress: Stress Concern Present (04/30/2021)  Tobacco Use: Low Risk  (05/24/2022)   SDOH Interventions: Housing Interventions: Patient Refused   Readmission Risk Interventions     No data to display

## 2022-05-26 NOTE — Progress Notes (Signed)
PROGRESS NOTE    Mandy Durham  [id number] DOB: [birth year] DOA: 05/24/2022 PCP: Colon Branch, MD   Brief Narrative:  Mrs. Dalzell, a 77 y/o followed for HFpEF, Parkinson's disease, HTN, HLD, orthostatic hypotension/autonomic dysfunction, h/o TBI, SAH. In January she was hospitalized with Covid 19. Due to weakness and dibilitation she was discharged to a SNF. Approximately a week ago she was able to return home. She saw Dr. Larose Kells, her PCP, 05/19/22 where the issues was significant confusion, inability to manage her medications, continued malnutrition. Her husband reports that her confusion, which had cleared in the SNF was much worse over the past 3 days. She has not been drinking fluids but he says she eats well, she has been incontinent and less mobile. He reports she has been warm to touch but no documented fever. Due to her confusion and weakness she presents to [facility] for evaluation.   Assessment & Plan:   Principal Problem:   Encephalopathy, metabolic Active Problems:   Ventricular tachycardia (HCC)   Dehydration   HLD (hyperlipidemia)   Anxiety   Borderline diabetes   Orthostatic hypotension   Essential hypertension   Diastolic CHF (HCC)   Atypical parkinsonism   Malnutrition of moderate degree   Metabolic encephalopathy  Acute metabolic encephalopathy, improving Rule out sundowning vs delirium Questionable polypharmacy Patient with significant but fluctuating levels of confusion that started with last hospitalization for covid but reportedly resolved while at [facility]. Was at baseline at home with rather acute change in mental status prior to hospital admission Hold home baclofen and clonazepam -would suspect these medications are likely the cause or at least playing a role in patient's mental status -would recommend discontinuing these at discharge if feasible CT head and MRI was negative for stroke No overt infection, review of systems negative per patient and family prior to this  event Multiple episodes of diarrhea overnight, C. difficile testing pending Ammonia within normal limits Lactic acid negative; respiratory panel negative Patient currently on multiple inhaled medications not noted on final med rec, she carries no diagnosis of COPD or asthma at the time of her last discharge. These will be discontinued  Failure to thrive, moderate malnutrition Poor PO intake Hypovolemia Completed IVF resuscitation  Hold off on aggressive fluids given diastolic dysfunction heart failure as below Patient without overt AKI  Advance diet as tolerated - p.o. intake improving   Atypical parkinsonism Patient followed by Internal medicine and neurology, continues to have progressive symptoms At baseline she has a chronic stutter(since childhood per husband) Underlying Parkinson's dementia is also likely ongoing and progressing. Continue Carbidopa-levodopa PT OT and speech to follow   Diastolic CHF ([place]), without acute exacerbation Appears euvolemic today after IV fluids, discontinued as above Repeat echocardiogram ordered although unlikely to change clinical course in the setting of encephalopathy  Ventricular tachycardia [facility]) Patient with 3:1 AVB with pacemaker.  Continue carvedilol   Essential hypertension Continue home losartan   Chronic orthostatic hypotension Likely in the setting of parkinsonism, abdominal binder as appropriate   Borderline diabetes Last A1C 01/28/22 was 6.6%.  Diet controlled, continue carb restricted diet   Anxiety Long standing problem - clonazepam discontinued Recommend discontinuing clonazepam given patient's age and risk factors Continue Lexapro    HLD (hyperlipidemia) Continue Zetia  DVT prophylaxis: Lovenox Code Status: Full Family Communication: Husband updated at bedside  Status is: Observation  Dispo: The patient is from: Home              Anticipated d/c is  to: To be determined              Anticipated d/c date is:  24-48h              Patient currently NOT medically stable for discharge  Consultants:  None  Procedures:  None  Antimicrobials:  None indicated   Subjective: No acute issues/events overnight -patient appears to be back to baseline per husband at bedside.  Patient tolerating p.o. quite well denies nausea vomiting diarrhea constipation headache fevers chills or chest pain despite multiple episodes of diarrhea overnight.  Review of systems likely somewhat limited given her baseline.  Objective: Vitals:   05/25/22 1100 05/25/22 1700 05/25/22 2000 05/26/22 0400  BP: 127/71 125/69 (!) 122/53 (!) 154/77  Pulse: 75 79 80 73  Resp: '17 15 18 20  '$ Temp: 97.9 F (36.6 C) 97.8 F (36.6 C) 98 F (36.7 C) 98.2 F (36.8 C)  TempSrc: Axillary Oral Axillary Oral  SpO2: 98% 98% 97%   Weight:      Height:       No intake or output data in the 24 hours ending 05/26/22 0741 Filed Weights   05/24/22 1021  Weight: 48.5 kg    Examination:  General:  Pleasantly resting in bed, No acute distress.  HEENT:  Normocephalic atraumatic.  Sclerae nonicteric, noninjected.  Extraocular movements intact bilaterally. Neck:  Without mass or deformity.  Trachea is midline. Lungs:  Clear to auscultate bilaterally without rhonchi, wheeze, or rales. Heart:  Regular rate and rhythm.  Without murmurs, rubs, or gallops. Abdomen:  Soft, nontender, nondistended.  Without guarding or rebound. Extremities: Without cyanosis, clubbing, edema, or obvious deformity. Vascular:  Dorsalis pedis and posterior tibial pulses palpable bilaterally. Skin:  Warm and dry, no erythema, no ulcerations.  Data Reviewed: I have personally reviewed following labs and imaging studies  CBC: Recent Labs  Lab 05/24/22 1026 05/26/22 0332  WBC 6.1 7.5  NEUTROABS 3.9  --   HGB 13.3 12.2  HCT 40.8 37.4  MCV 87.4 87.4  PLT 297 [id number]    Basic Metabolic Panel: Recent Labs  Lab 05/24/22 1026 05/26/22 0332  NA 140 137  K 3.7 3.7   CL 106 108  CO2 22 23  GLUCOSE 106* 103*  BUN 28* 29*  CREATININE 0.84 0.85  CALCIUM 9.2 8.7*    GFR: Estimated Creatinine Clearance: 42.4 mL/min (by C-G formula based on SCr of 0.85 mg/dL). Liver Function Tests: Recent Labs  Lab 05/24/22 1026 05/26/22 0332  AST 23 20  ALT 14 6  ALKPHOS 64 61  BILITOT 0.8 0.4  PROT 6.4* 5.6*  ALBUMIN 3.5 3.0*    Recent Labs  Lab 05/24/22 1600  AMMONIA 25    Coagulation Profile: Recent Labs  Lab 05/24/22 1050  INR 1.1    Sepsis Labs: Recent Labs  Lab 05/24/22 1026  LATICACIDVEN 1.0     Recent Results (from the past 240 hour(s))  Resp panel by RT-PCR (RSV, Flu A&B, Covid) Anterior Nasal Swab     Status: None   Collection Time: 05/24/22 10:32 AM   Specimen: Anterior Nasal Swab  Result Value Ref Range Status   SARS Coronavirus 2 by RT PCR NEGATIVE NEGATIVE Final   Influenza A by PCR NEGATIVE NEGATIVE Final   Influenza B by PCR NEGATIVE NEGATIVE Final    Comment: (NOTE) The Xpert Xpress SARS-CoV-2/FLU/RSV plus assay is intended as an aid in the diagnosis of influenza from Nasopharyngeal swab specimens and should not be used as  a sole basis for treatment. Nasal washings and aspirates are unacceptable for Xpert Xpress SARS-CoV-2/FLU/RSV testing.  Fact Sheet for Patients: EntrepreneurPulse.com.au  Fact Sheet for Healthcare Providers: IncredibleEmployment.be  This test is not yet approved or cleared by the [place] FDA and has been authorized for detection and/or diagnosis of SARS-CoV-2 by FDA under an Emergency Use Authorization (EUA). This EUA will remain in effect (meaning this test can be used) for the duration of the COVID-19 declaration under Section 564(b)(1) of the Act, 21 U.S.C. section 360bbb-3(b)(1), unless the authorization is terminated or revoked.     Resp Syncytial Virus by PCR NEGATIVE NEGATIVE Final    Comment: (NOTE) Fact Sheet for  Patients: EntrepreneurPulse.com.au  Fact Sheet for Healthcare Providers: IncredibleEmployment.be  This test is not yet approved or cleared by the [place] FDA and has been authorized for detection and/or diagnosis of SARS-CoV-2 by FDA under an Emergency Use Authorization (EUA). This EUA will remain in effect (meaning this test can be used) for the duration of the COVID-19 declaration under Section 564(b)(1) of the Act, 21 U.S.C. section 360bbb-3(b)(1), unless the authorization is terminated or revoked.  Performed at [facility], [place] [address], [place], [place] [address]     Radiology Studies: MR BRAIN WO CONTRAST  Result Date: 05/25/2022 CLINICAL DATA:  Neuro deficit, acute, stroke suspected worsening confusion and aphasia EXAM: MRI HEAD WITHOUT CONTRAST TECHNIQUE: Multiplanar, multiecho pulse sequences of the brain and surrounding structures were obtained without intravenous contrast. COMPARISON:  CT head May 24, 2022. FINDINGS: Brain: Encephalomalacia in the inferior right frontal lobe and right insula. Some mild susceptibility artifact in this region, compatible with prior hemorrhage. No evidence of acute infarct, acute hemorrhage, mass lesion, midline shift or hydrocephalus. Additional scattered T2/FLAIR hyperintensities in the white matter, nonspecific but compatible with chronic microvascular ischemic change. Vascular: Major arterial flow voids are maintained at the skull base. T2/FLAIR hyperintensity in the left transverse and sigmoid sinuses most likely relates to slow flow. Skull and upper cervical spine: Normal marrow signal. Sinuses/Orbits: Mild paranasal sinus mucosal thickening. No acute orbital findings. Other: No mastoid effusions. IMPRESSION: 1. No evidence of acute intracranial abnormality. 2. Remote insult in right frontal lobe and right insula. Electronically Signed   By: Margaretha Sheffield M.D.   On: 05/25/2022 16:35   CT  HEAD WO CONTRAST (5MM)  Result Date: 05/24/2022 CLINICAL DATA:  77 year old female with altered mental status. 2016 history of fall with right frontotemporal hemorrhagic contusions, intracranial hemorrhage. EXAM: CT HEAD WITHOUT CONTRAST TECHNIQUE: Contiguous axial images were obtained from the base of the skull through the vertex without intravenous contrast. RADIATION DOSE REDUCTION: This exam was performed according to the departmental dose-optimization program which includes automated exposure control, adjustment of the mA and/or kV according to patient size and/or use of iterative reconstruction technique. COMPARISON:  Head CT levin 2817. FINDINGS: Brain: Chronic encephalomalacia in the right inferior frontal gyrus, right insula and operculum. Mild generalized cerebral volume loss since 2017. No superimposed No midline shift, ventriculomegaly, mass effect, evidence of mass lesion, intracranial hemorrhage or evidence of cortically based acute infarction. Otherwise mild for age patchy bilateral white matter hypodensity. Vascular: Calcified atherosclerosis at the skull base. No suspicious intracranial vascular hyperdensity. Skull: No acute osseous abnormality identified. Sinuses/Orbits: Minimal new paranasal sinus mucosal thickening since 2017. No sinus fluid levels. Tympanic cavities and mastoids remain clear. Other: Postoperative changes to both globes since the prior CT. No acute orbit or scalp soft tissue finding. IMPRESSION: 1. No acute  intracranial abnormality. 2. Stable chronic posttraumatic right frontotemporal encephalomalacia. Mild for age cerebral white matter changes. Electronically Signed   By: Genevie Ann M.D.   On: 05/24/2022 11:59   DG Chest Port 1 View  Result Date: 05/24/2022 CLINICAL DATA:  Questionable sepsis. EXAM: PORTABLE CHEST 1 VIEW COMPARISON:  None Available. FINDINGS: 1052 hours. Lungs are hyperexpanded. The lungs are clear without focal pneumonia, edema, pneumothorax or pleural  effusion. The cardiopericardial silhouette is within normal limits for size. The visualized bony structures of the thorax are unremarkable. Telemetry leads overlie the chest. Left permanent pacemaker again noted. IMPRESSION: Hyperexpansion without acute cardiopulmonary findings. Electronically Signed   By: Misty Stanley M.D.   On: 05/24/2022 11:02     Scheduled Meds:  aspirin EC  81 mg Oral Daily   carbidopa-levodopa  1 tablet Oral QID   cholecalciferol  1,000 Units Oral Once per day on Mon Thu   enoxaparin (LOVENOX) injection  40 mg Subcutaneous Q24H   escitalopram  10 mg Oral Daily   ezetimibe  10 mg Oral Daily   feeding supplement (GLUCERNA SHAKE)  237 mL Oral TID BM   losartan  100 mg Oral Daily   Continuous Infusions:   LOS: 1 day   Time spent: 72mn  Monetta Lick C Calene Paradiso, DO [facility]  If 7PM-7AM, please contact night-coverage [email or web address]  05/26/2022, 7:41 AM

## 2022-05-27 ENCOUNTER — Telehealth: Payer: Self-pay

## 2022-05-27 DIAGNOSIS — E860 Dehydration: Secondary | ICD-10-CM | POA: Diagnosis present

## 2022-05-27 DIAGNOSIS — Z8249 Family history of ischemic heart disease and other diseases of the circulatory system: Secondary | ICD-10-CM | POA: Diagnosis not present

## 2022-05-27 DIAGNOSIS — G4700 Insomnia, unspecified: Secondary | ICD-10-CM

## 2022-05-27 DIAGNOSIS — I447 Left bundle-branch block, unspecified: Secondary | ICD-10-CM

## 2022-05-27 DIAGNOSIS — I110 Hypertensive heart disease with heart failure: Secondary | ICD-10-CM | POA: Diagnosis present

## 2022-05-27 DIAGNOSIS — Z8616 Personal history of COVID-19: Secondary | ICD-10-CM | POA: Diagnosis not present

## 2022-05-27 DIAGNOSIS — I5032 Chronic diastolic (congestive) heart failure: Secondary | ICD-10-CM

## 2022-05-27 DIAGNOSIS — R7303 Prediabetes: Secondary | ICD-10-CM | POA: Diagnosis present

## 2022-05-27 DIAGNOSIS — R627 Adult failure to thrive: Secondary | ICD-10-CM | POA: Diagnosis present

## 2022-05-27 DIAGNOSIS — G909 Disorder of the autonomic nervous system, unspecified: Secondary | ICD-10-CM | POA: Diagnosis present

## 2022-05-27 DIAGNOSIS — Z1152 Encounter for screening for COVID-19: Secondary | ICD-10-CM | POA: Diagnosis not present

## 2022-05-27 DIAGNOSIS — G9341 Metabolic encephalopathy: Secondary | ICD-10-CM | POA: Diagnosis not present

## 2022-05-27 DIAGNOSIS — I951 Orthostatic hypotension: Secondary | ICD-10-CM | POA: Diagnosis present

## 2022-05-27 DIAGNOSIS — E440 Moderate protein-calorie malnutrition: Secondary | ICD-10-CM | POA: Diagnosis present

## 2022-05-27 DIAGNOSIS — G9340 Encephalopathy, unspecified: Secondary | ICD-10-CM | POA: Diagnosis present

## 2022-05-27 DIAGNOSIS — T424X5A Adverse effect of benzodiazepines, initial encounter: Secondary | ICD-10-CM | POA: Diagnosis present

## 2022-05-27 DIAGNOSIS — F411 Generalized anxiety disorder: Secondary | ICD-10-CM

## 2022-05-27 DIAGNOSIS — E861 Hypovolemia: Secondary | ICD-10-CM | POA: Diagnosis not present

## 2022-05-27 DIAGNOSIS — E785 Hyperlipidemia, unspecified: Secondary | ICD-10-CM

## 2022-05-27 DIAGNOSIS — I10 Essential (primary) hypertension: Secondary | ICD-10-CM | POA: Diagnosis not present

## 2022-05-27 DIAGNOSIS — G20A1 Parkinson's disease without dyskinesia, without mention of fluctuations: Secondary | ICD-10-CM | POA: Diagnosis present

## 2022-05-27 DIAGNOSIS — F32A Depression, unspecified: Secondary | ICD-10-CM | POA: Diagnosis present

## 2022-05-27 DIAGNOSIS — F0284 Dementia in other diseases classified elsewhere, unspecified severity, with anxiety: Secondary | ICD-10-CM | POA: Diagnosis present

## 2022-05-27 DIAGNOSIS — Z681 Body mass index (BMI) 19 or less, adult: Secondary | ICD-10-CM | POA: Diagnosis not present

## 2022-05-27 DIAGNOSIS — Z7982 Long term (current) use of aspirin: Secondary | ICD-10-CM

## 2022-05-27 DIAGNOSIS — Z950 Presence of cardiac pacemaker: Secondary | ICD-10-CM

## 2022-05-27 DIAGNOSIS — E663 Overweight: Secondary | ICD-10-CM

## 2022-05-27 DIAGNOSIS — F29 Unspecified psychosis not due to a substance or known physiological condition: Secondary | ICD-10-CM | POA: Diagnosis present

## 2022-05-27 DIAGNOSIS — F0283 Dementia in other diseases classified elsewhere, unspecified severity, with mood disturbance: Secondary | ICD-10-CM | POA: Diagnosis present

## 2022-05-27 DIAGNOSIS — T428X5A Adverse effect of antiparkinsonism drugs and other central muscle-tone depressants, initial encounter: Secondary | ICD-10-CM | POA: Diagnosis present

## 2022-05-27 DIAGNOSIS — I472 Ventricular tachycardia, unspecified: Secondary | ICD-10-CM | POA: Diagnosis present

## 2022-05-27 DIAGNOSIS — G928 Other toxic encephalopathy: Secondary | ICD-10-CM | POA: Diagnosis present

## 2022-05-27 DIAGNOSIS — I495 Sick sinus syndrome: Secondary | ICD-10-CM | POA: Diagnosis present

## 2022-05-27 DIAGNOSIS — Z9181 History of falling: Secondary | ICD-10-CM

## 2022-05-27 DIAGNOSIS — G20B1 Parkinson's disease with dyskinesia, without mention of fluctuations: Secondary | ICD-10-CM | POA: Diagnosis not present

## 2022-05-27 LAB — CBC
HCT: 35.1 % — ABNORMAL LOW (ref 36.0–46.0)
Hemoglobin: 11.3 g/dL — ABNORMAL LOW (ref 12.0–15.0)
MCH: 28.5 pg (ref 26.0–34.0)
MCHC: 32.2 g/dL (ref 30.0–36.0)
MCV: 88.4 fL (ref 80.0–100.0)
Platelets: 270 10*3/uL (ref 150–400)
RBC: 3.97 MIL/uL (ref 3.87–5.11)
RDW: 15 % (ref 11.5–15.5)
WBC: 5.7 10*3/uL (ref 4.0–10.5)
nRBC: 0 % (ref 0.0–0.2)

## 2022-05-27 LAB — COMPREHENSIVE METABOLIC PANEL
ALT: 7 U/L (ref 0–44)
AST: 14 U/L — ABNORMAL LOW (ref 15–41)
Albumin: 2.8 g/dL — ABNORMAL LOW (ref 3.5–5.0)
Alkaline Phosphatase: 67 U/L (ref 38–126)
Anion gap: 7 (ref 5–15)
BUN: 34 mg/dL — ABNORMAL HIGH (ref 8–23)
CO2: 23 mmol/L (ref 22–32)
Calcium: 8.6 mg/dL — ABNORMAL LOW (ref 8.9–10.3)
Chloride: 106 mmol/L (ref 98–111)
Creatinine, Ser: 0.79 mg/dL (ref 0.44–1.00)
GFR, Estimated: >60 mL/min (ref >60)
Glucose, Bld: 98 mg/dL (ref 70–99)
Potassium: 3.7 mmol/L (ref 3.5–5.1)
Sodium: 136 mmol/L (ref 135–145)
Total Bilirubin: 0.3 mg/dL (ref 0.3–1.2)
Total Protein: 5.4 g/dL — ABNORMAL LOW (ref 6.5–8.1)

## 2022-05-27 MED ORDER — LACTATED RINGERS IV BOLUS
250.0000 mL | Freq: Once | INTRAVENOUS | Status: AC
  Administered 2022-05-27: 250 mL via INTRAVENOUS

## 2022-05-27 MED ORDER — MIDODRINE HCL 5 MG PO TABS
5.0000 mg | ORAL_TABLET | Freq: Three times a day (TID) | ORAL | Status: DC
  Administered 2022-05-27: 5 mg via ORAL
  Filled 2022-05-27: qty 1

## 2022-05-27 MED ORDER — TRAZODONE HCL 50 MG PO TABS
25.0000 mg | ORAL_TABLET | ORAL | Status: AC
  Administered 2022-05-27: 25 mg via ORAL
  Filled 2022-05-27: qty 1

## 2022-05-27 NOTE — Telephone Encounter (Signed)
Physician orders signed and faxed back to [facility] at [phone]. Forms sent for scanning.

## 2022-05-27 NOTE — TOC Progression Note (Addendum)
Transition of Care [facility]) - Progression Note    Patient Details  Name: Mandy Durham MRN: [id number] Date of Birth: [birth year]  Transition of Care [facility]) CM/SW Contact  Mearl Latin, LCSW Phone Number: 05/27/2022, 4:07 PM  Clinical Narrative:    CSW met with spouse and discussed denial from [facility] for SNF (PTAR was approved). Spouse reported agreement and is requesting to proceed with private pay at [place]. CSW spoke with [place] and they need to conduct an asset search prior to accepting patient. They will contact CSW tomorrow to proceed.   CSW also updated patient's daughter as requested by spouse. She will come up to the hospital tonight.    Expected Discharge Plan: Skilled Nursing Facility Barriers to Discharge: Insurance Authorization  Expected Discharge Plan and Services In-house Referral: Clinical Social Work   Post Acute Care Choice: Home Health Living arrangements for the past 2 months: Single Family Home                                       Social Determinants of Health (SDOH) Interventions SDOH Screenings   Food Insecurity: No Food Insecurity (05/25/2022)  Housing: Low Risk  (05/25/2022)  Transportation Needs: No Transportation Needs (05/25/2022)  Utilities: Not At Risk (05/25/2022)  Alcohol Screen: Low Risk  (04/30/2021)  Depression (PHQ2-9): Medium Risk (01/28/2022)  Financial Resource Strain: Low Risk  (04/30/2021)  Physical Activity: Inactive (04/30/2021)  Social Connections: Moderately Integrated (04/30/2021)  Stress: Stress Concern Present (04/30/2021)  Tobacco Use: Low Risk  (05/24/2022)    Readmission Risk Interventions     No data to display

## 2022-05-27 NOTE — Progress Notes (Signed)
Physical Therapy Treatment Patient Details Name: Mandy Durham MRN: [id number] DOB: [birth year] Today's Date: 05/27/2022   History of Present Illness Mandy Durham, a 77 y/o who presented on 3/10 due to increased confusion with urinary symptoms. Work up demonstrates U/A concentrated w/o pyuria. PMHx:  HFpEF, Parkinson's disease, HTN, HLD, orthostatic hypotension/autonomic dysfunction, h/o TBI, SAH, recent admission 03/2022 for COVID    PT Comments    Pt tolerated today's session well,  husband present throughout, able to increase ambulation distance and progressing sit>stand transfers with repetition. Pt continuing to require minG to minA for all mobility with use of RW, consistently performing posterior lean upon initial stand and correcting with cues but physical assist to correct is still required. Discussed discharge with pt and her spouse, both agreeing they would like her stronger and more independent upon discharge as pt's husband is able to provide supervision but utilizes a cane at baseline. Acute PT will continue to follow up with pt as appropriate to progress mobility, continue to recommend SNF at this time to maximize independence with mobility prior to discharge home.     Recommendations for follow up therapy are one component of a multi-disciplinary discharge planning process, led by the attending physician.  Recommendations may be updated based on patient status, additional functional criteria and insurance authorization.  Follow Up Recommendations  Skilled nursing-short term rehab (<3 hours/day) Can patient physically be transported by private vehicle: Yes   Assistance Recommended at Discharge Frequent or constant Supervision/Assistance  Patient can return home with the following A little help with walking and/or transfers;Assistance with cooking/housework;Help with stairs or ramp for entrance;Assist for transportation   Equipment Recommendations  None recommended by PT     Recommendations for Other Services       Precautions / Restrictions Precautions Precautions: Fall Restrictions Weight Bearing Restrictions: No     Mobility  Bed Mobility Overal bed mobility: Needs Assistance Bed Mobility: Supine to Sit     Supine to sit: Min guard, HOB elevated     General bed mobility comments: minG for safety and line management, pt performing with use of bed rail    Transfers Overall transfer level: Needs assistance Equipment used: Rolling walker (2 wheels) Transfers: Sit to/from Stand Sit to Stand: Min assist           General transfer comment: assist required for power up from bed and steady due to posterior lean. Standing from chair with cueing for use of B arm rests, able to power up with minG and minA required for initial balance    Ambulation/Gait Ambulation/Gait assistance: Min guard Gait Distance (Feet): 200 Feet (with 1 standing rest break, x15 feet second trial) Assistive device: Rolling walker (2 wheels) Gait Pattern/deviations: Step-to pattern, Step-through pattern, Decreased stride length, Trunk flexed Gait velocity: decreased     General Gait Details: step-to pattern progressing to step through with cueing for increased step length B, pattern smoother with improved stride length   Stairs             Wheelchair Mobility    Modified Rankin (Stroke Patients Only)       Balance Overall balance assessment: Needs assistance Sitting-balance support: Feet supported, Bilateral upper extremity supported Sitting balance-Leahy Scale: Fair   Postural control: Posterior lean Standing balance support: Bilateral upper extremity supported, During functional activity, Reliant on assistive device for balance Standing balance-Leahy Scale: Poor Standing balance comment: assist required with initial standing due to posterior lean, correcting with verbal and visual cues for toes  down, RW wheels down, and hips forward, improving with  repetition                            Cognition Arousal/Alertness: Awake/alert Behavior During Therapy: WFL for tasks assessed/performed Overall Cognitive Status: Within Functional Limits for tasks assessed                                 General Comments: Pt's husband present at bedside today, reporting he feels her cognition is at baseline        Exercises Other Exercises Other Exercises: sit<>stand x3 reps from chair for BLE strengthening and improving form    General Comments General comments (skin integrity, edema, etc.): VSS on room air      Pertinent Vitals/Pain Pain Assessment Pain Assessment: No/denies pain    Home Living                          Prior Function            PT Goals (current goals can now be found in the care plan section) Acute Rehab PT Goals Patient Stated Goal: get better PT Goal Formulation: With patient Time For Goal Achievement: 06/09/22 Potential to Achieve Goals: Good Progress towards PT goals: Progressing toward goals    Frequency    Min 3X/week      PT Plan Current plan remains appropriate    Co-evaluation              AM-PAC PT "6 Clicks" Mobility   Outcome Measure  Help needed turning from your back to your side while in a flat bed without using bedrails?: A Little Help needed moving from lying on your back to sitting on the side of a flat bed without using bedrails?: A Little Help needed moving to and from a bed to a chair (including a wheelchair)?: A Little Help needed standing up from a chair using your arms (e.g., wheelchair or bedside chair)?: A Little Help needed to walk in hospital room?: A Little Help needed climbing 3-5 steps with a railing? : Total 6 Click Score: 16    End of Session Equipment Utilized During Treatment: Gait belt Activity Tolerance: Patient tolerated treatment well Patient left: in chair;with call bell/phone within reach;with chair alarm set;with  family/visitor present Nurse Communication: Mobility status PT Visit Diagnosis: Muscle weakness (generalized) (M62.81);Difficulty in walking, not elsewhere classified (R26.2);Other abnormalities of gait and mobility (R26.89)     Time: [id number] PT Time Calculation (min) (ACUTE ONLY): 38 min  Charges:  $Gait Training: 8-22 mins $Therapeutic Activity: 23-37 mins                     Charlynne Cousins, PT DPT Acute Rehabilitation Services Office [phone]    Luvenia Heller 05/27/2022, 2:45 PM

## 2022-05-27 NOTE — Progress Notes (Addendum)
PROGRESS NOTE        PATIENT DETAILS Name: Mandy Durham Age: 77 y.o. Sex: female Date of Birth: 09-21-1945 Admit Date: 05/24/2022 Admitting Physician Neena Rhymes, MD PCP:Paz, Alda Berthold, MD  Brief Summary: Patient is a 77 y.o.  female with history of HFpEF, HTN, HLD, Parkinson's disease with autonomic dysfunction, history of TBI/SAH-who presented to the hospital for evaluation of acute metabolic encephalopathy in the setting of polypharmacy   Significant events: 3/10>> admit to [facility]  Significant studies: 3/11>> MRI brain: No acute intracranial abnormality  Significant microbiology data: 3/10>> COVID/influenza/RSV PCR: Negative 3/12>> stool C. difficile: Negative  Procedures: None  Consults: None  Subjective: Lying comfortably in bed-denies any chest pain or shortness of breath.  Objective: Vitals: Blood pressure (!) 142/79, pulse 89, temperature (!) 97.3 F (36.3 C), temperature source Oral, resp. rate 17, height '5\' 7"'$  (1.702 m), weight 48.5 kg, SpO2 97 %.   Exam: Gen Exam:Alert awake-not in any distress HEENT:atraumatic, normocephalic Chest: B/L clear to auscultation anteriorly CVS:S1S2 regular Abdomen:soft non tender, non distended Extremities:no edema Neurology: Non focal Skin: no rash  Pertinent Labs/Radiology:    Latest Ref Rng & Units 05/27/2022    3:13 AM 05/26/2022    3:32 AM 05/24/2022   10:26 AM  CBC  WBC 4.0 - 10.5 K/uL 5.7  7.5  6.1   Hemoglobin 12.0 - 15.0 g/dL 11.3  12.2  13.3   Hematocrit 36.0 - 46.0 % 35.1  37.4  40.8   Platelets 150 - 400 K/uL 270  290  297     Lab Results  Component Value Date   NA 136 05/27/2022   K 3.7 05/27/2022   CL 106 05/27/2022   CO2 23 05/27/2022      Assessment/Plan: Acute toxic metabolic encephalopathy Seems to have improved-no family at bedside but answering most of my questions appropriately Agree with Dr. Santa Lighter Klonopin/baclofen-perhaps diarrhea/dehydration all  playing probably were playing a role in her encephalopathy.  After IV fluid hydration-holding culprit medications-mentation seems to have improved.  Atypical parkinsonism Continue carbidopa/levodopa Chronic stutter at baseline since childhood  Chronic orthostatic hypotension Supportive care  Addendum: Briefly hypotensive-but asymptomatic-responded to IVF bolus, BP still soft-will start midodrine. Suspect this may be related to autonomic dysfunction  Chronic HFpEF Euvolemic  HTN Stable Losartan  History of advanced heart block-s/p PPM placement 2017 Telemetry monitoring  HLD Zetia  Anxiety/depression Stable Lexapro No longer on as needed Klonopin  Failure to thrive syndrome Debility/deconditioning Poor oral intake Continue PT/OT/nutrition eval SNF planned on discharge Social worker following  Nutrition Status: Nutrition Problem: Moderate Malnutrition Etiology: chronic illness Signs/Symptoms: moderate fat depletion, severe muscle depletion Interventions: Ensure Enlive (each supplement provides 350kcal and 20 grams of protein), MVI, Liberalize Diet  Underweight: Estimated body mass index is 16.76 kg/m as calculated from the following:   Height as of this encounter: '5\' 7"'$  (1.702 m).   Weight as of this encounter: 48.5 kg.   Code status:   Code Status: Full Code   DVT Prophylaxis: enoxaparin (LOVENOX) injection 40 mg Start: 05/25/22 1800   Family Communication: None at bedside   Disposition Plan: Status is: Observation The patient remains OBS appropriate and will d/c before 2 midnights.   Planned Discharge Destination:Skilled nursing facility   Diet: Diet Order             Diet regular  Room service appropriate? Yes with Assist; Fluid consistency: Thin  Diet effective now                     Antimicrobial agents: Anti-infectives (From admission, onward)    None        MEDICATIONS: Scheduled Meds:  aspirin EC  81 mg Oral Daily    carbidopa-levodopa  1 tablet Oral QID   cholecalciferol  1,000 Units Oral Once per day on Mon Thu   enoxaparin (LOVENOX) injection  40 mg Subcutaneous Q24H   escitalopram  10 mg Oral Daily   ezetimibe  10 mg Oral Daily   feeding supplement  237 mL Oral BID BM   losartan  100 mg Oral Daily   multivitamin with minerals  1 tablet Oral Daily   Continuous Infusions: PRN Meds:.acetaminophen, albuterol, nitroGLYCERIN   I have personally reviewed following labs and imaging studies  LABORATORY DATA: CBC: Recent Labs  Lab 05/24/22 1026 05/26/22 0332 05/27/22 0313  WBC 6.1 7.5 5.7  NEUTROABS 3.9  --   --   HGB 13.3 12.2 11.3*  HCT 40.8 37.4 35.1*  MCV 87.4 87.4 88.4  PLT 297 290 [id number]    Basic Metabolic Panel: Recent Labs  Lab 05/24/22 1026 05/26/22 0332 05/27/22 0313  NA 140 137 136  K 3.7 3.7 3.7  CL 106 108 106  CO2 '22 23 23  '$ GLUCOSE 106* 103* 98  BUN 28* 29* 34*  CREATININE 0.84 0.85 0.79  CALCIUM 9.2 8.7* 8.6*    GFR: Estimated Creatinine Clearance: 45.1 mL/min (by C-G formula based on SCr of 0.79 mg/dL).  Liver Function Tests: Recent Labs  Lab 05/24/22 1026 05/26/22 0332 05/27/22 0313  AST 23 20 14*  ALT '14 6 7  '$ ALKPHOS 64 61 67  BILITOT 0.8 0.4 0.3  PROT 6.4* 5.6* 5.4*  ALBUMIN 3.5 3.0* 2.8*   No results for input(s): "LIPASE", "AMYLASE" in the last 168 hours. Recent Labs  Lab 05/24/22 1600  AMMONIA 25    Coagulation Profile: Recent Labs  Lab 05/24/22 1050  INR 1.1    Cardiac Enzymes: No results for input(s): "CKTOTAL", "CKMB", "CKMBINDEX", "TROPONINI" in the last 168 hours.  BNP (last 3 results) No results for input(s): "PROBNP" in the last 8760 hours.  Lipid Profile: No results for input(s): "CHOL", "HDL", "LDLCALC", "TRIG", "CHOLHDL", "LDLDIRECT" in the last 72 hours.  Thyroid Function Tests: No results for input(s): "TSH", "T4TOTAL", "FREET4", "T3FREE", "THYROIDAB" in the last 72 hours.  Anemia Panel: No results for input(s):  "VITAMINB12", "FOLATE", "FERRITIN", "TIBC", "IRON", "RETICCTPCT" in the last 72 hours.  Urine analysis:    Component Value Date/Time   COLORURINE YELLOW 05/24/2022 1031   APPEARANCEUR CLEAR 05/24/2022 1031   LABSPEC >1.030 (H) 05/24/2022 1031   PHURINE 7.0 05/24/2022 1031   GLUCOSEU NEGATIVE 05/24/2022 1031   GLUCOSEU NEGATIVE 01/28/2007 1200   HGBUR NEGATIVE 05/24/2022 1031   BILIRUBINUR SMALL (A) 05/24/2022 1031   KETONESUR NEGATIVE 05/24/2022 1031   PROTEINUR NEGATIVE 05/24/2022 1031   UROBILINOGEN 0.2 01/25/2015 0915   NITRITE NEGATIVE 05/24/2022 1031   LEUKOCYTESUR NEGATIVE 05/24/2022 1031    Sepsis Labs: Lactic Acid, Venous    Component Value Date/Time   LATICACIDVEN 1.0 05/24/2022 1026    MICROBIOLOGY: Recent Results (from the past 240 hour(s))  Resp panel by RT-PCR (RSV, Flu A&B, Covid) Anterior Nasal Swab     Status: None   Collection Time: 05/24/22 10:32 AM   Specimen: Anterior Nasal Swab  Result Value  Ref Range Status   SARS Coronavirus 2 by RT PCR NEGATIVE NEGATIVE Final   Influenza A by PCR NEGATIVE NEGATIVE Final   Influenza B by PCR NEGATIVE NEGATIVE Final    Comment: (NOTE) The Xpert Xpress SARS-CoV-2/FLU/RSV plus assay is intended as an aid in the diagnosis of influenza from Nasopharyngeal swab specimens and should not be used as a sole basis for treatment. Nasal washings and aspirates are unacceptable for Xpert Xpress SARS-CoV-2/FLU/RSV testing.  Fact Sheet for Patients: EntrepreneurPulse.com.au  Fact Sheet for Healthcare Providers: IncredibleEmployment.be  This test is not yet approved or cleared by the [place] FDA and has been authorized for detection and/or diagnosis of SARS-CoV-2 by FDA under an Emergency Use Authorization (EUA). This EUA will remain in effect (meaning this test can be used) for the duration of the COVID-19 declaration under Section 564(b)(1) of the Act, 21 U.S.C. section  360bbb-3(b)(1), unless the authorization is terminated or revoked.     Resp Syncytial Virus by PCR NEGATIVE NEGATIVE Final    Comment: (NOTE) Fact Sheet for Patients: EntrepreneurPulse.com.au  Fact Sheet for Healthcare Providers: IncredibleEmployment.be  This test is not yet approved or cleared by the [place] FDA and has been authorized for detection and/or diagnosis of SARS-CoV-2 by FDA under an Emergency Use Authorization (EUA). This EUA will remain in effect (meaning this test can be used) for the duration of the COVID-19 declaration under Section 564(b)(1) of the Act, 21 U.S.C. section 360bbb-3(b)(1), unless the authorization is terminated or revoked.  Performed at [facility], [place] [address], [place], [place] [address]   C Difficile Quick Screen w PCR reflex     Status: None   Collection Time: 05/26/22  3:05 PM   Specimen: STOOL  Result Value Ref Range Status   C Diff antigen NEGATIVE NEGATIVE Final   C Diff toxin NEGATIVE NEGATIVE Final   C Diff interpretation No C. difficile detected.  Final    Comment: Performed at [facility], [place] [address], [place], [place] [address]    RADIOLOGY STUDIES/RESULTS: MR BRAIN WO CONTRAST  Result Date: 05/25/2022 CLINICAL DATA:  Neuro deficit, acute, stroke suspected worsening confusion and aphasia EXAM: MRI HEAD WITHOUT CONTRAST TECHNIQUE: Multiplanar, multiecho pulse sequences of the brain and surrounding structures were obtained without intravenous contrast. COMPARISON:  CT head May 24, 2022. FINDINGS: Brain: Encephalomalacia in the inferior right frontal lobe and right insula. Some mild susceptibility artifact in this region, compatible with prior hemorrhage. No evidence of acute infarct, acute hemorrhage, mass lesion, midline shift or hydrocephalus. Additional scattered T2/FLAIR hyperintensities in the white matter, nonspecific but compatible with chronic microvascular ischemic  change. Vascular: Major arterial flow voids are maintained at the skull base. T2/FLAIR hyperintensity in the left transverse and sigmoid sinuses most likely relates to slow flow. Skull and upper cervical spine: Normal marrow signal. Sinuses/Orbits: Mild paranasal sinus mucosal thickening. No acute orbital findings. Other: No mastoid effusions. IMPRESSION: 1. No evidence of acute intracranial abnormality. 2. Remote insult in right frontal lobe and right insula. Electronically Signed   By: Margaretha Sheffield M.D.   On: 05/25/2022 16:35     LOS: 1 day   Oren Binet, MD  [facility]    To contact the attending provider between 7A-7P or the covering provider during after hours 7P-7A, please log into the web site [email or web address] and access using universal [place] password for that web site. If you do not have the password, please call the hospital operator.  05/27/2022,  11:02 AM

## 2022-05-27 NOTE — Telephone Encounter (Signed)
[place] plan of care signed and faxed back to [facility] at [phone]. Form sent for scanning.

## 2022-05-28 LAB — CBC
HCT: 38.4 % (ref 36.0–46.0)
Hemoglobin: 12.2 g/dL (ref 12.0–15.0)
MCH: 28.4 pg (ref 26.0–34.0)
MCHC: 31.8 g/dL (ref 30.0–36.0)
MCV: 89.5 fL (ref 80.0–100.0)
Platelets: 288 10*3/uL (ref 150–400)
RBC: 4.29 MIL/uL (ref 3.87–5.11)
RDW: 15.3 % (ref 11.5–15.5)
WBC: 6.5 10*3/uL (ref 4.0–10.5)
nRBC: 0 % (ref 0.0–0.2)

## 2022-05-28 LAB — COMPREHENSIVE METABOLIC PANEL
ALT: <5 U/L (ref 0–44)
AST: 19 U/L (ref 15–41)
Albumin: 3.1 g/dL — ABNORMAL LOW (ref 3.5–5.0)
Alkaline Phosphatase: 62 U/L (ref 38–126)
Anion gap: 9 (ref 5–15)
BUN: 32 mg/dL — ABNORMAL HIGH (ref 8–23)
CO2: 22 mmol/L (ref 22–32)
Calcium: 8.8 mg/dL — ABNORMAL LOW (ref 8.9–10.3)
Chloride: 105 mmol/L (ref 98–111)
Creatinine, Ser: 0.84 mg/dL (ref 0.44–1.00)
GFR, Estimated: >60 mL/min (ref >60)
Glucose, Bld: 86 mg/dL (ref 70–99)
Potassium: 3.8 mmol/L (ref 3.5–5.1)
Sodium: 136 mmol/L (ref 135–145)
Total Bilirubin: 0.8 mg/dL (ref 0.3–1.2)
Total Protein: 6 g/dL — ABNORMAL LOW (ref 6.5–8.1)

## 2022-05-28 MED ORDER — TRAZODONE HCL 50 MG PO TABS
25.0000 mg | ORAL_TABLET | Freq: Once | ORAL | Status: AC
  Administered 2022-05-28: 25 mg via ORAL
  Filled 2022-05-28: qty 1

## 2022-05-28 NOTE — Progress Notes (Signed)
Occupational Therapy Treatment Patient Details Name: Mandy Durham MRN: [id number] DOB: [birth year] Today's Date: 05/28/2022   History of present illness Mrs. Vantassel, a 77 y/o who presented on 3/10 due to increased confusion with urinary symptoms. Work up demonstrates U/A concentrated w/o pyuria. PMHx:  HFpEF, Parkinson's disease, HTN, HLD, orthostatic hypotension/autonomic dysfunction, h/o TBI, SAH, recent admission 03/2022 for COVID   OT comments  Min assist for supine to sit, min to mod assist for sit to stand and to ambulate with RW. Difficulty initiating and continuing movement, but improved as session continued. Total assist needed to manage mesh panties after toileting on BSC over toilet. Stood at sink with min guard assist and combed hair. Pt remained in chair at end of session with husband present and needs met.    Recommendations for follow up therapy are one component of a multi-disciplinary discharge planning process, led by the attending physician.  Recommendations may be updated based on patient status, additional functional criteria and insurance authorization.    Follow Up Recommendations  Skilled nursing-short term rehab (<3 hours/day)     Assistance Recommended at Discharge Frequent or constant Supervision/Assistance  Patient can return home with the following  A lot of help with walking and/or transfers;A lot of help with bathing/dressing/bathroom;Assistance with cooking/housework;Direct supervision/assist for medications management;Direct supervision/assist for financial management;Assist for transportation;Help with stairs or ramp for entrance;Assistance with feeding   Equipment Recommendations  None recommended by OT    Recommendations for Other Services      Precautions / Restrictions Precautions Precautions: Fall       Mobility Bed Mobility Overal bed mobility: Needs Assistance Bed Mobility: Supine to Sit     Supine to sit: Min assist     General bed  mobility comments: pulled trunk up on therapist's hand    Transfers Overall transfer level: Needs assistance Equipment used: Rolling walker (2 wheels) Transfers: Sit to/from Stand Sit to Stand: Min assist, Mod assist           General transfer comment: min from [facility], mod from bed     Balance Overall balance assessment: Needs assistance   Sitting balance-Leahy Scale: Fair     Standing balance support: Bilateral upper extremity supported, During functional activity, Reliant on assistive device for balance Standing balance-Leahy Scale: Poor                             ADL either performed or assessed with clinical judgement   ADL Overall ADL's : Needs assistance/impaired Eating/Feeding: Set up;Sitting Eating/Feeding Details (indicate cue type and reason): coffee Grooming: Brushing hair;Standing;Min guard                   Toilet Transfer: Minimal assistance;Rolling walker (2 wheels);BSC/3in1;Ambulation   Toileting- Clothing Manipulation and Hygiene: Total assistance;Sit to/from stand       Functional mobility during ADLs: Moderate assistance;Minimal assistance;Rolling walker (2 wheels)      Extremity/Trunk Assessment              Vision       Perception     Praxis      Cognition Arousal/Alertness: Awake/alert Behavior During Therapy: WFL for tasks assessed/performed Overall Cognitive Status: Impaired/Different from baseline Area of Impairment: Problem solving                             Problem Solving: Slow processing, Decreased initiation, Difficulty sequencing, Requires  verbal cues, Requires tactile cues          Exercises      Shoulder Instructions       General Comments      Pertinent Vitals/ Pain       Pain Assessment Pain Assessment: No/denies pain  Home Living                                          Prior Functioning/Environment              Frequency  Min 2X/week         Progress Toward Goals  OT Goals(current goals can now be found in the care plan section)  Progress towards OT goals: Progressing toward goals  Acute Rehab OT Goals OT Goal Formulation: With patient Time For Goal Achievement: 06/08/22 Potential to Achieve Goals: Good  Plan Discharge plan remains appropriate    Co-evaluation                 AM-PAC OT "6 Clicks" Daily Activity     Outcome Measure   Help from another person eating meals?: A Little Help from another person taking care of personal grooming?: A Little Help from another person toileting, which includes using toliet, bedpan, or urinal?: Total Help from another person bathing (including washing, rinsing, drying)?: A Lot Help from another person to put on and taking off regular upper body clothing?: A Little Help from another person to put on and taking off regular lower body clothing?: A Lot 6 Click Score: 14    End of Session Equipment Utilized During Treatment: Gait belt;Rolling walker (2 wheels)  OT Visit Diagnosis: Other abnormalities of gait and mobility (R26.89);Unsteadiness on feet (R26.81);Muscle weakness (generalized) (M62.81);Pain Pain - Right/Left: Right   Activity Tolerance Patient tolerated treatment well   Patient Left in chair;with call bell/phone within reach;with chair alarm set;with family/visitor present   Nurse Communication          Time: 1001-1051 OT Time Calculation (min): 50 min  Charges: OT General Charges $OT Visit: 1 Visit OT Treatments $Self Care/Home Management : 38-52 mins  Cleta Alberts, OTR/L Acute Rehabilitation Services Office: [phone]   Malka So 05/28/2022, 10:56 AM

## 2022-05-28 NOTE — TOC Progression Note (Addendum)
Transition of Care [facility]) - Progression Note    Patient Details  Name: Mandy Durham MRN: [id number] Date of Birth: [birth year]  Transition of Care [facility]) CM/SW [place], LCSW Phone Number: 05/28/2022, 2:14 PM  Clinical Narrative:    12pm-[facility] has received approval on patient's asset check. They are unable to reach spouse on his phone. CSW will check in the room.  2pm-CSW assisted patient's spouse in room to call [employer]'s business office to arrange payment. Will follow up on acceptance date.   4pm-[facility] able to accept patient tomorrow. CSW met with spouse and provided denial letter from [facility] and explained appeal process. CSW also provided Adult Day program info. Will provide him with [facility] info as well. He requests PTAR (approved by [facility]ine[facility] Douglas # [id number]).   Expected Discharge Plan: Skilled Nursing Facility Barriers to Discharge: Insurance Authorization  Expected Discharge Plan and Services In-house Referral: Clinical Social Work   Post Acute Care Choice: [place] arrangements for the past 2 months: Single Family Home                                       Social Determinants of Health (SDOH) Interventions SDOH Screenings   Food Insecurity: No Food Insecurity (05/25/2022)  Housing: Low Risk  (05/25/2022)  Transportation Needs: No Transportation Needs (05/25/2022)  Utilities: Not At Risk (05/25/2022)  Alcohol Screen: Low Risk  (04/30/2021)  Depression (PHQ2-9): Medium Risk (01/28/2022)  Financial Resource Strain: Low Risk  (04/30/2021)  Physical Activity: Inactive (04/30/2021)  Social Connections: Moderately Integrated (04/30/2021)  Stress: Stress Concern Present (04/30/2021)  Tobacco Use: Low Risk  (05/24/2022)    Readmission Risk Interventions     No data to display

## 2022-05-28 NOTE — TOC Progression Note (Incomplete Revision)
Transition of Care [facility]) - Progression Note    Patient Details  Name: Mandy Durham MRN: [id number] Date of Birth: July 06, 1945  Transition of Care [facility]) CM/SW [place], LCSW Phone Number: 05/28/2022, 2:14 PM  Clinical Narrative:    12pm-[facility] has received approval on patient's asset check. They are unable to reach spouse on his phone. CSW will check in the room.  2pm-CSW assisted patient's spouse in room to call [facility]'s business office to arrange payment. Will follow up on acceptance date.   4pm-[facility] able to accept patient tomorrow. CSW met with spouse and provided denial letter from [facility] and explained appeal process. CSW also provided Adult Day program info. Will provide him with [facility] info as well. He requests PTAR (   Expected Discharge Plan: [place] Barriers to Discharge: Insurance Authorization  Expected Discharge Plan and Services In-house Referral: Clinical Social Work   Post Acute Care Choice: [place] arrangements for the past 2 months: Single Family Home                                       Social Determinants of Health (SDOH) Interventions SDOH Screenings   Food Insecurity: No Food Insecurity (05/25/2022)  Housing: Low Risk  (05/25/2022)  Transportation Needs: No Transportation Needs (05/25/2022)  Utilities: Not At Risk (05/25/2022)  Alcohol Screen: Low Risk  (04/30/2021)  Depression (PHQ2-9): Medium Risk (01/28/2022)  Financial Resource Strain: Low Risk  (04/30/2021)  Physical Activity: Inactive (04/30/2021)  Social Connections: Moderately Integrated (04/30/2021)  Stress: Stress Concern Present (04/30/2021)  Tobacco Use: Low Risk  (05/24/2022)    Readmission Risk Interventions     No data to display

## 2022-05-28 NOTE — Discharge Summary (Addendum)
PATIENT DETAILS Name: Mandy Durham Age: 77 y.o. Sex: female Date of Birth: Dec 11, 1945 MRN: [id number]. Admitting Physician: Jonetta Osgood, MD PCP:Paz, Alda Berthold, MD  Admit Date: 05/24/2022 Discharge date: 05/29/2022  Recommendations for Outpatient Follow-up:  Follow up with PCP in 1-2 weeks Please obtain CMP/CBC in one week  Admitted From:  Home  Disposition: Skilled nursing facility   Discharge Condition: good  CODE STATUS:   Code Status: Full Code   Diet recommendation:  Diet Order             Diet - low sodium heart healthy           Diet regular Room service appropriate? Yes with Assist; Fluid consistency: Thin  Diet effective now                    Brief Summary: Patient is a 77 y.o.  female with history of HFpEF, HTN, HLD, Parkinson's disease with autonomic dysfunction, history of TBI/SAH-who presented to the hospital for evaluation of acute metabolic encephalopathy in the setting of polypharmacy    Significant events: 3/10>> admit to [facility]   Significant studies: 3/11>> MRI brain: No acute intracranial abnormality   Significant microbiology data: 3/10>> COVID/influenza/RSV PCR: Negative 3/12>> stool C. difficile: Negative   Procedures: None   Consults: None  Brief Hospital Course: Acute toxic metabolic encephalopathy Seems to have improved-no family at bedside but answering most of my questions appropriately Agree with Dr. Santa Lighter Klonopin/baclofen-perhaps diarrhea/dehydration all playing probably were playing a role in her encephalopathy.  After IV fluid hydration-holding culprit medications-mentation seems to have improved.   Atypical parkinsonism Continue carbidopa/levodopa Chronic stutter at baseline since childhood   Chronic orthostatic hypotension Supportive care  Chronic HFpEF Euvolemic   HTN BP continues to fluctuate-suspect this is from autonomic dysfunction in the setting of Parkinson's disease Was briefly  hypotensive yesterday-is now normotensive Hold losartan-allow permissive hypertension to certain he.   History of advanced heart block-s/p PPM placement 2017   HLD Zetia   Anxiety/depression Stable Lexapro No longer on as needed Klonopin   Failure to thrive syndrome Debility/deconditioning Poor oral intake Continue PT/OT/nutrition eval SNF planned on discharge  Nutrition Status: Nutrition Problem: Moderate Malnutrition Etiology: chronic illness Signs/Symptoms: moderate fat depletion, severe muscle depletion Interventions: Ensure Enlive (each supplement provides 350kcal and 20 grams of protein), MVI, Liberalize Diet   Underweight: Estimated body mass index is 16.76 kg/m as calculated from the following:   Height as of this encounter: 5\' 7"  (1.702 m).   Weight as of this encounter: 48.5 kg.    Discharge Diagnoses:  Principal Problem:   Encephalopathy, metabolic Active Problems:   Ventricular tachycardia (HCC)   Dehydration   HLD (hyperlipidemia)   Anxiety   Borderline diabetes   Orthostatic hypotension   Essential hypertension   Diastolic CHF (HCC)   Atypical parkinsonism   Malnutrition of moderate degree   Metabolic encephalopathy   Discharge Instructions:  Activity:  As tolerated with Full fall precautions use walker/cane & assistance as needed   Discharge Instructions     Diet - low sodium heart healthy   Complete by: As directed    Discharge instructions   Complete by: As directed    Follow with Primary MD  Colon Branch, MD in 1-2 weeks  Please get a complete blood count and chemistry panel checked by your Primary MD at your next visit, and again as instructed by your Primary MD.  Get Medicines reviewed and adjusted: Please take  all your medications with you for your next visit with your Primary MD  Laboratory/radiological data: Please request your Primary MD to go over all hospital tests and procedure/radiological results at the follow up, please  ask your Primary MD to get all Hospital records sent to his/her office.  In some cases, they will be blood work, cultures and biopsy results pending at the time of your discharge. Please request that your primary care M.D. follows up on these results.  Also Note the following: If you experience worsening of your admission symptoms, develop shortness of breath, life threatening emergency, suicidal or homicidal thoughts you must seek medical attention immediately by calling 911 or calling your MD immediately  if symptoms less severe.  You must read complete instructions/literature along with all the possible adverse reactions/side effects for all the Medicines you take and that have been prescribed to you. Take any new Medicines after you have completely understood and accpet all the possible adverse reactions/side effects.   Do not drive when taking Pain medications or sleeping medications (Benzodaizepines)  Do not take more than prescribed Pain, Sleep and Anxiety Medications. It is not advisable to combine anxiety,sleep and pain medications without talking with your primary care practitioner  Special Instructions: If you have smoked or chewed Tobacco  in the last 2 yrs please stop smoking, stop any regular Alcohol  and or any Recreational drug use.  Wear Seat belts while driving.  Please note: You were cared for by a hospitalist during your hospital stay. Once you are discharged, your primary care physician will handle any further medical issues. Please note that NO REFILLS for any discharge medications will be authorized once you are discharged, as it is imperative that you return to your primary care physician (or establish a relationship with a primary care physician if you do not have one) for your post hospital discharge needs so that they can reassess your need for medications and monitor your lab values.   Increase activity slowly   Complete by: As directed       Allergies as of  05/29/2022   No Known Allergies      Medication List     STOP taking these medications    baclofen 10 MG tablet Commonly known as: LIORESAL   clonazePAM 0.25 MG disintegrating tablet Commonly known as: KLONOPIN   losartan 100 MG tablet Commonly known as: COZAAR       TAKE these medications    acetaminophen 500 MG tablet Commonly known as: TYLENOL Take 500 mg by mouth daily as needed for moderate pain.   albuterol (2.5 MG/3ML) 0.083% nebulizer solution Commonly known as: PROVENTIL Take 3 mLs (2.5 mg total) by nebulization every 2 (two) hours as needed for wheezing.   aspirin EC 81 MG tablet Take 81 mg by mouth daily. Swallow whole.   Breztri Aerosphere 160-9-4.8 MCG/ACT Aero Generic drug: Budeson-Glycopyrrol-Formoterol Inhale 2 puffs into the lungs in the morning and at bedtime. What changed:  when to take this reasons to take this   carbidopa-levodopa 25-100 MG tablet Commonly known as: SINEMET IR Take 1 tablet by mouth 4 times daily at 8am/noon/4pm/8pm What changed:  how much to take how to take this when to take this additional instructions   escitalopram 10 MG tablet Commonly known as: LEXAPRO Take 1 tablet (10 mg total) by mouth daily.   ezetimibe 10 MG tablet Commonly known as: ZETIA Take 1 tablet (10 mg total) by mouth daily.   lactose free nutrition Liqd  Take 237 mLs by mouth 2 (two) times daily between meals.   nitroGLYCERIN 0.4 MG SL tablet Commonly known as: NITROSTAT Place 1 tablet (0.4 mg total) under the tongue every 5 (five) minutes x 3 doses as needed for chest pain.   VITAMIN D PO Take 1 tablet by mouth 2 (two) times a week. Vitamin D        Contact information for after-discharge care     Destination     [facility] .   Service: Skilled Nursing Contact information: [address] [facility] [phone]                    No Known Allergies   Other Procedures/Studies: MR  BRAIN WO CONTRAST  Result Date: 05/25/2022 CLINICAL DATA:  Neuro deficit, acute, stroke suspected worsening confusion and aphasia EXAM: MRI HEAD WITHOUT CONTRAST TECHNIQUE: Multiplanar, multiecho pulse sequences of the brain and surrounding structures were obtained without intravenous contrast. COMPARISON:  CT head May 24, 2022. FINDINGS: Brain: Encephalomalacia in the inferior right frontal lobe and right insula. Some mild susceptibility artifact in this region, compatible with prior hemorrhage. No evidence of acute infarct, acute hemorrhage, mass lesion, midline shift or hydrocephalus. Additional scattered T2/FLAIR hyperintensities in the white matter, nonspecific but compatible with chronic microvascular ischemic change. Vascular: Major arterial flow voids are maintained at the skull base. T2/FLAIR hyperintensity in the left transverse and sigmoid sinuses most likely relates to slow flow. Skull and upper cervical spine: Normal marrow signal. Sinuses/Orbits: Mild paranasal sinus mucosal thickening. No acute orbital findings. Other: No mastoid effusions. IMPRESSION: 1. No evidence of acute intracranial abnormality. 2. Remote insult in right frontal lobe and right insula. Electronically Signed   By: Margaretha Sheffield M.D.   On: 05/25/2022 16:35   CT HEAD WO CONTRAST (5MM)  Result Date: 05/24/2022 CLINICAL DATA:  77 year old female with altered mental status. 2016 history of fall with right frontotemporal hemorrhagic contusions, intracranial hemorrhage. EXAM: CT HEAD WITHOUT CONTRAST TECHNIQUE: Contiguous axial images were obtained from the base of the skull through the vertex without intravenous contrast. RADIATION DOSE REDUCTION: This exam was performed according to the departmental dose-optimization program which includes automated exposure control, adjustment of the mA and/or kV according to patient size and/or use of iterative reconstruction technique. COMPARISON:  Head CT levin 2817. FINDINGS: Brain:  Chronic encephalomalacia in the right inferior frontal gyrus, right insula and operculum. Mild generalized cerebral volume loss since 2017. No superimposed No midline shift, ventriculomegaly, mass effect, evidence of mass lesion, intracranial hemorrhage or evidence of cortically based acute infarction. Otherwise mild for age patchy bilateral white matter hypodensity. Vascular: Calcified atherosclerosis at the skull base. No suspicious intracranial vascular hyperdensity. Skull: No acute osseous abnormality identified. Sinuses/Orbits: Minimal new paranasal sinus mucosal thickening since 2017. No sinus fluid levels. Tympanic cavities and mastoids remain clear. Other: Postoperative changes to both globes since the prior CT. No acute orbit or scalp soft tissue finding. IMPRESSION: 1. No acute intracranial abnormality. 2. Stable chronic posttraumatic right frontotemporal encephalomalacia. Mild for age cerebral white matter changes. Electronically Signed   By: Genevie Ann M.D.   On: 05/24/2022 11:59   DG Chest Port 1 View  Result Date: 05/24/2022 CLINICAL DATA:  Questionable sepsis. EXAM: PORTABLE CHEST 1 VIEW COMPARISON:  None Available. FINDINGS: 1052 hours. Lungs are hyperexpanded. The lungs are clear without focal pneumonia, edema, pneumothorax or pleural effusion. The cardiopericardial silhouette is within normal limits for size. The visualized bony structures of the thorax  are unremarkable. Telemetry leads overlie the chest. Left permanent pacemaker again noted. IMPRESSION: Hyperexpansion without acute cardiopulmonary findings. Electronically Signed   By: Kennith Center M.D.   On: 05/24/2022 11:02     TODAY-DAY OF DISCHARGE:  Subjective:   Domingue Mclin today has no headache,no chest abdominal pain,no new weakness tingling or numbness, feels much better wants to go home today.   Objective:   Blood pressure 117/62, pulse 71, temperature 97.8 F (36.6 C), temperature source Oral, resp. rate 15, height 5\' 7"   (1.702 m), weight 48.5 kg, SpO2 98 %.  Intake/Output Summary (Last 24 hours) at 05/29/2022 0809 Last data filed at 05/28/2022 1900 Gross per 24 hour  Intake --  Output 500 ml  Net -500 ml   Filed Weights   05/24/22 1021  Weight: 48.5 kg    Exam: Awake Alert, Oriented *3, No new F.N deficits, Normal affect [place].AT,PERRAL Supple Neck,No JVD, No cervical lymphadenopathy appriciated.  Symmetrical Chest wall movement, Good air movement bilaterally, CTAB RRR,No Gallops,Rubs or new Murmurs, No Parasternal Heave +ve B.Sounds, Abd Soft, Non tender, No organomegaly appriciated, No rebound -guarding or rigidity. No Cyanosis, Clubbing or edema, No new Rash or bruise   PERTINENT RADIOLOGIC STUDIES: No results found.   PERTINENT LAB RESULTS: CBC: Recent Labs    05/27/22 0313 05/28/22 0232  WBC 5.7 6.5  HGB 11.3* 12.2  HCT 35.1* 38.4  PLT 270 288   CMET CMP     Component Value Date/Time   NA 136 05/28/2022 0232   NA 138 08/26/2021 0000   K 3.8 05/28/2022 0232   CL 105 05/28/2022 0232   CO2 22 05/28/2022 0232   GLUCOSE 86 05/28/2022 0232   BUN 32 (H) 05/28/2022 0232   BUN 19 08/26/2021 0000   CREATININE 0.84 05/28/2022 0232   CREATININE 0.85 06/07/2020 1030   CALCIUM 8.8 (L) 05/28/2022 0232   PROT 6.0 (L) 05/28/2022 0232   PROT 6.3 06/09/2021 1450   ALBUMIN 3.1 (L) 05/28/2022 0232   ALBUMIN 4.2 06/09/2021 1450   AST 19 05/28/2022 0232   ALT <5 05/28/2022 0232   ALKPHOS 62 05/28/2022 0232   BILITOT 0.8 05/28/2022 0232   BILITOT 0.3 06/09/2021 1450   GFRNONAA >60 05/28/2022 0232   GFRAA 93 12/04/2019 1540    GFR Estimated Creatinine Clearance: 42.9 mL/min (by C-G formula based on SCr of 0.84 mg/dL). No results for input(s): "LIPASE", "AMYLASE" in the last 72 hours. No results for input(s): "CKTOTAL", "CKMB", "CKMBINDEX", "TROPONINI" in the last 72 hours. Invalid input(s): "POCBNP" No results for input(s): "DDIMER" in the last 72 hours. No results for input(s):  "HGBA1C" in the last 72 hours. No results for input(s): "CHOL", "HDL", "LDLCALC", "TRIG", "CHOLHDL", "LDLDIRECT" in the last 72 hours. No results for input(s): "TSH", "T4TOTAL", "T3FREE", "THYROIDAB" in the last 72 hours.  Invalid input(s): "FREET3" No results for input(s): "VITAMINB12", "FOLATE", "FERRITIN", "TIBC", "IRON", "RETICCTPCT" in the last 72 hours. Coags: No results for input(s): "INR" in the last 72 hours.  Invalid input(s): "PT" Microbiology: Recent Results (from the past 240 hour(s))  Resp panel by RT-PCR (RSV, Flu A&B, Covid) Anterior Nasal Swab     Status: None   Collection Time: 05/24/22 10:32 AM   Specimen: Anterior Nasal Swab  Result Value Ref Range Status   SARS Coronavirus 2 by RT PCR NEGATIVE NEGATIVE Final   Influenza A by PCR NEGATIVE NEGATIVE Final   Influenza B by PCR NEGATIVE NEGATIVE Final    Comment: (NOTE) The Xpert Xpress  SARS-CoV-2/FLU/RSV plus assay is intended as an aid in the diagnosis of influenza from Nasopharyngeal swab specimens and should not be used as a sole basis for treatment. Nasal washings and aspirates are unacceptable for Xpert Xpress SARS-CoV-2/FLU/RSV testing.  Fact Sheet for Patients: BloggerCourse.com  Fact Sheet for Healthcare Providers: SeriousBroker.it  This test is not yet approved or cleared by the [place] FDA and has been authorized for detection and/or diagnosis of SARS-CoV-2 by FDA under an Emergency Use Authorization (EUA). This EUA will remain in effect (meaning this test can be used) for the duration of the COVID-19 declaration under Section 564(b)(1) of the Act, 21 U.S.C. section 360bbb-3(b)(1), unless the authorization is terminated or revoked.     Resp Syncytial Virus by PCR NEGATIVE NEGATIVE Final    Comment: (NOTE) Fact Sheet for Patients: BloggerCourse.com  Fact Sheet for Healthcare  Providers: SeriousBroker.it  This test is not yet approved or cleared by the [place] FDA and has been authorized for detection and/or diagnosis of SARS-CoV-2 by FDA under an Emergency Use Authorization (EUA). This EUA will remain in effect (meaning this test can be used) for the duration of the COVID-19 declaration under Section 564(b)(1) of the Act, 21 U.S.C. section 360bbb-3(b)(1), unless the authorization is terminated or revoked.  Performed at [facility], [address] [place], [place] [address]   C Difficile Quick Screen w PCR reflex     Status: None   Collection Time: 05/26/22  3:05 PM   Specimen: STOOL  Result Value Ref Range Status   C Diff antigen NEGATIVE NEGATIVE Final   C Diff toxin NEGATIVE NEGATIVE Final   C Diff interpretation No C. difficile detected.  Final    Comment: Performed at [facility], [address], [place], [place] [address]    FURTHER DISCHARGE INSTRUCTIONS:  Get Medicines reviewed and adjusted: Please take all your medications with you for your next visit with your Primary MD  Laboratory/radiological data: Please request your Primary MD to go over all hospital tests and procedure/radiological results at the follow up, please ask your Primary MD to get all Hospital records sent to his/her office.  In some cases, they will be blood work, cultures and biopsy results pending at the time of your discharge. Please request that your primary care M.D. goes through all the records of your hospital data and follows up on these results.  Also Note the following: If you experience worsening of your admission symptoms, develop shortness of breath, life threatening emergency, suicidal or homicidal thoughts you must seek medical attention immediately by calling 911 or calling your MD immediately  if symptoms less severe.  You must read complete instructions/literature along with all the possible adverse  reactions/side effects for all the Medicines you take and that have been prescribed to you. Take any new Medicines after you have completely understood and accpet all the possible adverse reactions/side effects.   Do not drive when taking Pain medications or sleeping medications (Benzodaizepines)  Do not take more than prescribed Pain, Sleep and Anxiety Medications. It is not advisable to combine anxiety,sleep and pain medications without talking with your primary care practitioner  Special Instructions: If you have smoked or chewed Tobacco  in the last 2 yrs please stop smoking, stop any regular Alcohol  and or any Recreational drug use.  Wear Seat belts while driving.  Please note: You were cared for by a hospitalist during your hospital stay. Once you are discharged, your primary care  physician will handle any further medical issues. Please note that NO REFILLS for any discharge medications will be authorized once you are discharged, as it is imperative that you return to your primary care physician (or establish a relationship with a primary care physician if you do not have one) for your post hospital discharge needs so that they can reassess your need for medications and monitor your lab values.  Total Time spent coordinating discharge including counseling, education and face to face time equals greater than 30 minutes.  SignedOren Binet 05/29/2022 8:09 AM

## 2022-05-28 NOTE — Care Management Important Message (Signed)
Important Message  Patient Details  Name: Mandy Durham MRN: [id number] Date of Birth: [birth year]   Medicare Important Message Given:  Yes     Jameeka Marcy Montine Circle 05/28/2022, 2:49 PM

## 2022-05-28 NOTE — Progress Notes (Signed)
PROGRESS NOTE        PATIENT DETAILS Name: Mandy Durham Age: 77 y.o. Sex: female Date of Birth: 1945-07-31 Admit Date: 05/24/2022 Admitting Physician Evalee Mutton Kristeen Mans, MD PCP:Paz, Alda Berthold, MD  Brief Summary: Patient is a 77 y.o.  female with history of HFpEF, HTN, HLD, Parkinson's disease with autonomic dysfunction, history of TBI/SAH-who presented to the hospital for evaluation of acute metabolic encephalopathy in the setting of polypharmacy   Significant events: 3/10>> admit to [facility]  Significant studies: 3/11>> MRI brain: No acute intracranial abnormality  Significant microbiology data: 3/10>> COVID/influenza/RSV PCR: Negative 3/12>> stool C. difficile: Negative  Procedures: None  Consults: None  Subjective: No major issues-seems to be answering all my questions appropriately.  No family at bedside.  Objective: Vitals: Blood pressure (!) 130/109, pulse 80, temperature 98 F (36.7 C), temperature source Oral, resp. rate 18, height '5\' 7"'$  (1.702 m), weight 48.5 kg, SpO2 95 %.   Exam: Gen Exam:Alert awake-not in any distress. Looks frail. HEENT:atraumatic, normocephalic Chest: B/L clear to auscultation anteriorly CVS:S1S2 regular Abdomen:soft non tender, non distended Extremities:no edema Neurology: Non focal Skin: no rash  Pertinent Labs/Radiology:    Latest Ref Rng & Units 05/28/2022    2:32 AM 05/27/2022    3:13 AM 05/26/2022    3:32 AM  CBC  WBC 4.0 - 10.5 K/uL 6.5  5.7  7.5   Hemoglobin 12.0 - 15.0 g/dL 12.2  11.3  12.2   Hematocrit 36.0 - 46.0 % 38.4  35.1  37.4   Platelets 150 - 400 K/uL 288  270  290     Lab Results  Component Value Date   NA 136 05/28/2022   K 3.8 05/28/2022   CL 105 05/28/2022   CO2 22 05/28/2022      Assessment/Plan: Acute toxic metabolic encephalopathy Seems to have improved-no family at bedside but answering most of my questions appropriately Agree with Dr. Santa Lighter  Klonopin/baclofen-perhaps diarrhea/dehydration all playing probably were playing a role in her encephalopathy.  After IV fluid hydration-holding culprit medications-mentation seems to have improved.   Atypical parkinsonism Continue carbidopa/levodopa Chronic stutter at baseline since childhood   Chronic orthostatic hypotension Supportive care   Chronic HFpEF Euvolemic   HTN BP continues to fluctuate-suspect this is from autonomic dysfunction in the setting of Parkinson's disease Was briefly hypotensive yesterday-is now normotensive Hold losartan-allow permissive hypertension to certain he.   History of advanced heart block-s/p PPM placement 2017   HLD Zetia   Anxiety/depression Stable Lexapro No longer on as needed Klonopin   Failure to thrive syndrome Debility/deconditioning Poor oral intake Continue PT/OT/nutrition eval SNF planned on discharge  Nutrition Status: Nutrition Problem: Moderate Malnutrition Etiology: chronic illness Signs/Symptoms: moderate fat depletion, severe muscle depletion Interventions: Ensure Enlive (each supplement provides 350kcal and 20 grams of protein), MVI, Liberalize Diet  Underweight: Estimated body mass index is 16.76 kg/m as calculated from the following:   Height as of this encounter: '5\' 7"'$  (1.702 m).   Weight as of this encounter: 48.5 kg.   Code status:   Code Status: Full Code   DVT Prophylaxis: enoxaparin (LOVENOX) injection 40 mg Start: 05/25/22 1800   Family Communication: [place] [phone] updated 3/14   Disposition Plan: Status is: Observation The patient remains OBS appropriate and will d/c before 2 midnights.   Planned Discharge Destination:Skilled nursing facility   Diet:  Diet Order             Diet - low sodium heart healthy           Diet regular Room service appropriate? Yes with Assist; Fluid consistency: Thin  Diet effective now                     Antimicrobial  agents: Anti-infectives (From admission, onward)    None        MEDICATIONS: Scheduled Meds:  aspirin EC  81 mg Oral Daily   carbidopa-levodopa  1 tablet Oral QID   cholecalciferol  1,000 Units Oral Once per day on Mon Thu   enoxaparin (LOVENOX) injection  40 mg Subcutaneous Q24H   escitalopram  10 mg Oral Daily   ezetimibe  10 mg Oral Daily   feeding supplement  237 mL Oral BID BM   multivitamin with minerals  1 tablet Oral Daily   Continuous Infusions: PRN Meds:.acetaminophen, albuterol, nitroGLYCERIN   I have personally reviewed following labs and imaging studies  LABORATORY DATA: CBC: Recent Labs  Lab 05/24/22 1026 05/26/22 0332 05/27/22 0313 05/28/22 0232  WBC 6.1 7.5 5.7 6.5  NEUTROABS 3.9  --   --   --   HGB 13.3 12.2 11.3* 12.2  HCT 40.8 37.4 35.1* 38.4  MCV 87.4 87.4 88.4 89.5  PLT 297 290 270 [id number]    Basic Metabolic Panel: Recent Labs  Lab 05/24/22 1026 05/26/22 0332 05/27/22 0313 05/28/22 0232  NA 140 137 136 136  K 3.7 3.7 3.7 3.8  CL 106 108 106 105  CO2 '22 23 23 22  '$ GLUCOSE 106* 103* 98 86  BUN 28* 29* 34* 32*  CREATININE 0.84 0.85 0.79 0.84  CALCIUM 9.2 8.7* 8.6* 8.8*    GFR: Estimated Creatinine Clearance: 42.9 mL/min (by C-G formula based on SCr of 0.84 mg/dL).  Liver Function Tests: Recent Labs  Lab 05/24/22 1026 05/26/22 0332 05/27/22 0313 05/28/22 0232  AST 23 20 14* 19  ALT '14 6 7 '$ <5  ALKPHOS 64 61 67 62  BILITOT 0.8 0.4 0.3 0.8  PROT 6.4* 5.6* 5.4* 6.0*  ALBUMIN 3.5 3.0* 2.8* 3.1*   No results for input(s): "LIPASE", "AMYLASE" in the last 168 hours. Recent Labs  Lab 05/24/22 1600  AMMONIA 25    Coagulation Profile: Recent Labs  Lab 05/24/22 1050  INR 1.1    Cardiac Enzymes: No results for input(s): "CKTOTAL", "CKMB", "CKMBINDEX", "TROPONINI" in the last 168 hours.  BNP (last 3 results) No results for input(s): "PROBNP" in the last 8760 hours.  Lipid Profile: No results for input(s): "CHOL", "HDL",  "LDLCALC", "TRIG", "CHOLHDL", "LDLDIRECT" in the last 72 hours.  Thyroid Function Tests: No results for input(s): "TSH", "T4TOTAL", "FREET4", "T3FREE", "THYROIDAB" in the last 72 hours.  Anemia Panel: No results for input(s): "VITAMINB12", "FOLATE", "FERRITIN", "TIBC", "IRON", "RETICCTPCT" in the last 72 hours.  Urine analysis:    Component Value Date/Time   COLORURINE YELLOW 05/24/2022 1031   APPEARANCEUR CLEAR 05/24/2022 1031   LABSPEC >1.030 (H) 05/24/2022 1031   PHURINE 7.0 05/24/2022 1031   GLUCOSEU NEGATIVE 05/24/2022 1031   GLUCOSEU NEGATIVE 01/28/2007 1200   HGBUR NEGATIVE 05/24/2022 1031   BILIRUBINUR SMALL (A) 05/24/2022 1031   KETONESUR NEGATIVE 05/24/2022 1031   PROTEINUR NEGATIVE 05/24/2022 1031   UROBILINOGEN 0.2 01/25/2015 0915   NITRITE NEGATIVE 05/24/2022 1031   LEUKOCYTESUR NEGATIVE 05/24/2022 1031    Sepsis Labs: Lactic Acid, Venous    Component Value  Date/Time   LATICACIDVEN 1.0 05/24/2022 1026    MICROBIOLOGY: Recent Results (from the past 240 hour(s))  Resp panel by RT-PCR (RSV, Flu A&B, Covid) Anterior Nasal Swab     Status: None   Collection Time: 05/24/22 10:32 AM   Specimen: Anterior Nasal Swab  Result Value Ref Range Status   SARS Coronavirus 2 by RT PCR NEGATIVE NEGATIVE Final   Influenza A by PCR NEGATIVE NEGATIVE Final   Influenza B by PCR NEGATIVE NEGATIVE Final    Comment: (NOTE) The Xpert Xpress SARS-CoV-2/FLU/RSV plus assay is intended as an aid in the diagnosis of influenza from Nasopharyngeal swab specimens and should not be used as a sole basis for treatment. Nasal washings and aspirates are unacceptable for Xpert Xpress SARS-CoV-2/FLU/RSV testing.  Fact Sheet for Patients: EntrepreneurPulse.com.au  Fact Sheet for Healthcare Providers: IncredibleEmployment.be  This test is not yet approved or cleared by the [place] FDA and has been authorized for detection and/or diagnosis of  SARS-CoV-2 by FDA under an Emergency Use Authorization (EUA). This EUA will remain in effect (meaning this test can be used) for the duration of the COVID-19 declaration under Section 564(b)(1) of the Act, 21 U.S.C. section 360bbb-3(b)(1), unless the authorization is terminated or revoked.     Resp Syncytial Virus by PCR NEGATIVE NEGATIVE Final    Comment: (NOTE) Fact Sheet for Patients: EntrepreneurPulse.com.au  Fact Sheet for Healthcare Providers: IncredibleEmployment.be  This test is not yet approved or cleared by the [place] FDA and has been authorized for detection and/or diagnosis of SARS-CoV-2 by FDA under an Emergency Use Authorization (EUA). This EUA will remain in effect (meaning this test can be used) for the duration of the COVID-19 declaration under Section 564(b)(1) of the Act, 21 U.S.C. section 360bbb-3(b)(1), unless the authorization is terminated or revoked.  Performed at [facility], [place] [address] [place], [place] [address]   C Difficile Quick Screen w PCR reflex     Status: None   Collection Time: 05/26/22  3:05 PM   Specimen: STOOL  Result Value Ref Range Status   C Diff antigen NEGATIVE NEGATIVE Final   C Diff toxin NEGATIVE NEGATIVE Final   C Diff interpretation No C. difficile detected.  Final    Comment: Performed at [facility], [place] [address] [place],  [address]    RADIOLOGY STUDIES/RESULTS: No results found.   LOS: 3 days   Oren Binet, MD  [facility]    To contact the attending provider between 7A-7P or the covering provider during after hours 7P-7A, please log into the web site [email or web address] and access using universal [place] password for that web site. If you do not have the password, please call the hospital operator.  05/28/2022, 12:21 PM

## 2022-05-29 ENCOUNTER — Ambulatory Visit: Payer: PPO | Admitting: Internal Medicine

## 2022-05-29 DIAGNOSIS — G9340 Encephalopathy, unspecified: Secondary | ICD-10-CM

## 2022-05-29 DIAGNOSIS — I951 Orthostatic hypotension: Secondary | ICD-10-CM

## 2022-05-29 DIAGNOSIS — I10 Essential (primary) hypertension: Secondary | ICD-10-CM

## 2022-05-29 LAB — COMPREHENSIVE METABOLIC PANEL
ALT: 10 U/L (ref 0–44)
AST: 20 U/L (ref 15–41)
Albumin: 3.2 g/dL — ABNORMAL LOW (ref 3.5–5.0)
Alkaline Phosphatase: 71 U/L (ref 38–126)
Anion gap: 8 (ref 5–15)
BUN: 30 mg/dL — ABNORMAL HIGH (ref 8–23)
CO2: 23 mmol/L (ref 22–32)
Calcium: 8.9 mg/dL (ref 8.9–10.3)
Chloride: 105 mmol/L (ref 98–111)
Creatinine, Ser: 0.75 mg/dL (ref 0.44–1.00)
GFR, Estimated: >60 mL/min (ref >60)
Glucose, Bld: 91 mg/dL (ref 70–99)
Potassium: 4 mmol/L (ref 3.5–5.1)
Sodium: 136 mmol/L (ref 135–145)
Total Bilirubin: 0.7 mg/dL (ref 0.3–1.2)
Total Protein: 6 g/dL — ABNORMAL LOW (ref 6.5–8.1)

## 2022-05-29 LAB — CBC
HCT: 38.1 % (ref 36.0–46.0)
Hemoglobin: 12.3 g/dL (ref 12.0–15.0)
MCH: 28.6 pg (ref 26.0–34.0)
MCHC: 32.3 g/dL (ref 30.0–36.0)
MCV: 88.6 fL (ref 80.0–100.0)
Platelets: 299 10*3/uL (ref 150–400)
RBC: 4.3 MIL/uL (ref 3.87–5.11)
RDW: 15.2 % (ref 11.5–15.5)
WBC: 4.7 10*3/uL (ref 4.0–10.5)
nRBC: 0 % (ref 0.0–0.2)

## 2022-05-29 NOTE — Consult Note (Signed)
   [facility] CM Inpatient Consult   05/29/2022  Mandy Durham Jul 13, 1945 [id number]  [place] Organization [ACO] Patient: [employer]  Referral:  Inpatient [facility] LCSW  Primary Care Provider:  Colon Branch, MD with [facility] is listed to provide the transition of care  Patient evaluated for community based chronic complex disease management services with [facility] as a benefit of patient's HTA Insurance. Met with patient at the bedside, no family member present.  Patient is generous and polite however not fully oriented. Patient for SNF transition.  As review of patient's electronic medical record encounters.  Patient has been engaged with [place] for resources.  Placed an appointment reminder card with the unit secretary in the envelope for the husband/family to contact [facility] when patient is getting ready to transition back home from paid SNF.  Spoke with husband, Eddie via phone number provider in EMR.  HIPAA verified for patient.  He was generous and information for [facility] given and explained information was also placed in her envelope for follow up and important for him to contact the office a few days before his wife is planning to return from the SNF.     Of note, [facility] Care Management services does not replace or interfere with any services that are arranged by inpatient case management or social work.  For additional questions or referrals please contact:    Natividad Brood, RN BSN [place]  [phone] business mobile phone Toll free office [phone]  *[place]  [phone] Fax number: [phone] [email or web address]

## 2022-05-29 NOTE — TOC Progression Note (Signed)
Transition of Care [facility]) - Progression Note    Patient Details  Name: Mandy Durham MRN: [id number] Date of Birth: 15-Jan-1946  Transition of Care [facility]) CM/SW [place], [place] Phone Number: 05/29/2022, 8:49 AM  Clinical Narrative:    E[place]ie [place] ready for patient. CSW sent referral to [facility] to assist with any post-SNF discharge needs as well.   Expected Discharge Plan: [place] Barriers to Discharge: Barriers Resolved  Expected Discharge Plan and Services In-house Referral: Clinical Social Work   Post Acute Care Choice: [place] arrangements for the past 2 months: Single Family Home Expected Discharge Date: 05/29/22                                     Social Determinants of Health (SDOH) Interventions SDOH Screenings   Food Insecurity: No Food Insecurity (05/25/2022)  Housing: Low Risk  (05/25/2022)  Transportation Needs: No Transportation Needs (05/25/2022)  Utilities: Not At Risk (05/25/2022)  Alcohol Screen: Low Risk  (04/30/2021)  Depression (PHQ2-9): Medium Risk (01/28/2022)  Financial Resource Strain: Low Risk  (04/30/2021)  Physical Activity: Inactive (04/30/2021)  Social Connections: Moderately Integrated (04/30/2021)  Stress: Stress Concern Present (04/30/2021)  Tobacco Use: Low Risk  (05/24/2022)    Readmission Risk Interventions     No data to display

## 2022-05-29 NOTE — Progress Notes (Signed)
Physical Therapy Treatment Patient Details Name: Mandy Durham MRN: [id number] DOB: [birth year] Today's Date: 05/29/2022   History of Present Illness Mandy Durham, a 77 y/o who presented on 3/10 due to increased confusion with urinary symptoms. Work up demonstrates U/A concentrated w/o pyuria. PMHx:  HFpEF, Parkinson's disease, HTN, HLD, orthostatic hypotension/autonomic dysfunction, h/o TBI, SAH, recent admission 03/2022 for COVID    PT Comments    Pt tolerated today's session well, remains motivated to work with therapy. Pt noted to have mild R lateral lean, requiring cueing and assist to correct, improving with ambulation but obvious while seated EOB. Pt providing cues for gait and posture with mobility, correcting posture when cues provided but remaining with narrow BOS. Acute PT will continue to follow up with pt during admission to progress mobility, discharge plans remain appropriate.     Recommendations for follow up therapy are one component of a multi-disciplinary discharge planning process, led by the attending physician.  Recommendations may be updated based on patient status, additional functional criteria and insurance authorization.  Follow Up Recommendations  Skilled nursing-short term rehab (<3 hours/day) Can patient physically be transported by private vehicle: Yes   Assistance Recommended at Discharge Frequent or constant Supervision/Assistance  Patient can return home with the following A little help with walking and/or transfers;Assistance with cooking/housework;Help with stairs or ramp for entrance;Assist for transportation   Equipment Recommendations  None recommended by PT    Recommendations for Other Services       Precautions / Restrictions Precautions Precautions: Fall Restrictions Weight Bearing Restrictions: No     Mobility  Bed Mobility Overal bed mobility: Needs Assistance Bed Mobility: Supine to Sit     Supine to sit: Min assist, HOB elevated      General bed mobility comments: minA for trunk support, pt with use of bed rail    Transfers Overall transfer level: Needs assistance Equipment used: Rolling walker (2 wheels) Transfers: Sit to/from Stand Sit to Stand: Mod assist           General transfer comment: pt requiring modA to stand and verbal cues for foot placement as pt with narrow stance almost with one foot on top of the other    Ambulation/Gait Ambulation/Gait assistance: Min assist Gait Distance (Feet): 150 Feet Assistive device: Rolling walker (2 wheels) Gait Pattern/deviations: Step-through pattern, Decreased stride length, Trunk flexed, Narrow base of support, Drifts right/left, Festinating Gait velocity: decreased     General Gait Details: festinating gait originally but progressing well once ambulating, narrow BOS with cues for widening steps as well as improved posture for upright positioning, pt with mild R lean in standing   Stairs             Wheelchair Mobility    Modified Rankin (Stroke Patients Only)       Balance Overall balance assessment: Needs assistance Sitting-balance support: Feet supported, Bilateral upper extremity supported Sitting balance-Leahy Scale: Fair Sitting balance - Comments: close guard Postural control: Right lateral lean Standing balance support: Bilateral upper extremity supported, During functional activity, Reliant on assistive device for balance Standing balance-Leahy Scale: Poor Standing balance comment: assist required for initial stand due to mild right lateral lean, improving with ambulation but minA and UE support required for balance                            Cognition Arousal/Alertness: Awake/alert Behavior During Therapy: WFL for tasks assessed/performed Overall Cognitive Status: Impaired/Different from baseline  Area of Impairment: Problem solving                             Problem Solving: Slow processing, Decreased  initiation, Difficulty sequencing, Requires verbal cues, Requires tactile cues General Comments: pt following commands with increased time        Exercises      General Comments General comments (skin integrity, edema, etc.): VSS on room air      Pertinent Vitals/Pain Pain Assessment Pain Assessment: No/denies pain    Home Living                          Prior Function            PT Goals (current goals can now be found in the care plan section) Acute Rehab PT Goals Patient Stated Goal: get better PT Goal Formulation: With patient Time For Goal Achievement: 06/09/22 Potential to Achieve Goals: Good Progress towards PT goals: Progressing toward goals    Frequency    Min 3X/week      PT Plan Current plan remains appropriate    Co-evaluation              AM-PAC PT "6 Clicks" Mobility   Outcome Measure  Help needed turning from your back to your side while in a flat bed without using bedrails?: A Little Help needed moving from lying on your back to sitting on the side of a flat bed without using bedrails?: A Little Help needed moving to and from a bed to a chair (including a wheelchair)?: A Lot Help needed standing up from a chair using your arms (e.g., wheelchair or bedside chair)?: A Lot Help needed to walk in hospital room?: A Little Help needed climbing 3-5 steps with a railing? : Total 6 Click Score: 14    End of Session Equipment Utilized During Treatment: Gait belt Activity Tolerance: Patient tolerated treatment well Patient left: in chair;with call bell/phone within reach;with chair alarm set;with nursing/sitter in room Nurse Communication: Mobility status PT Visit Diagnosis: Muscle weakness (generalized) (M62.81);Difficulty in walking, not elsewhere classified (R26.2);Other abnormalities of gait and mobility (R26.89)     Time: [id number] PT Time Calculation (min) (ACUTE ONLY): 26 min  Charges:  $Gait Training: 8-22  mins $Therapeutic Activity: 8-22 mins                     Charlynne Cousins, PT DPT Acute Rehabilitation Services Office [phone]    Luvenia Heller 05/29/2022, 2:53 PM

## 2022-05-29 NOTE — Plan of Care (Signed)

## 2022-05-29 NOTE — TOC Transition Note (Signed)
Transition of Care [facility]) - CM/SW Discharge Note   Patient Details  Name: Mandy Durham MRN: [id number] Date of Birth: 1945/11/16  [facility]) CM/SW Contact:  Benard Halsted, LCSW Phone Number: 05/29/2022, 9:56 AM   Clinical Narrative:    Patient will DC to: [place] Anticipated DC date: 05/29/22 Family notified: Spouse Transport by: Corey Harold   Per MD patient ready for DC to [place]. RN to call report prior to discharge [phone] room 205). RN, patient, patient's family, and facility notified of DC. Discharge Summary and FL2 sent to facility. DC packet on chart. Ambulance transport requested for patient.   CSW will sign off for now as social work intervention is no longer needed. Please consult [place] again if new needs arise.     Final next level of care: Skilled Nursing Facility Barriers to Discharge: Barriers Resolved   Patient Goals and CMS Choice CMS Medicare.gov Compare Post Acute Care list provided to:: Patient Represenative (must comment) Choice offered to / list presented to : Spouse  Discharge Placement     Existing PASRR number confirmed : 05/29/22          Patient chooses bed at: [facility] Patient to be transferred to facility by: [place] Name of family member notified: Spouse Patient and family notified of of transfer: 05/29/22  Discharge Plan and Services Additional resources added to the After Visit Summary for   In-house Referral: Clinical Social Work   Post Acute Care Choice: Home Health                               Social Determinants of Health (SDOH) Interventions SDOH Screenings   Food Insecurity: No Food Insecurity (05/25/2022)  Housing: Low Risk  (05/25/2022)  Transportation Needs: No Transportation Needs (05/25/2022)  Utilities: Not At Risk (05/25/2022)  Alcohol Screen: Low Risk  (04/30/2021)  Depression (PHQ2-9): Medium Risk (01/28/2022)  Financial Resource Strain: Low Risk  (04/30/2021)  Physical Activity: Inactive  (04/30/2021)  Social Connections: Moderately Integrated (04/30/2021)  Stress: Stress Concern Present (04/30/2021)  Tobacco Use: Low Risk  (05/24/2022)     Readmission Risk Interventions     No data to display

## 2022-06-01 DIAGNOSIS — I10 Essential (primary) hypertension: Secondary | ICD-10-CM | POA: Diagnosis not present

## 2022-06-01 DIAGNOSIS — R5381 Other malaise: Secondary | ICD-10-CM | POA: Diagnosis not present

## 2022-06-01 DIAGNOSIS — I442 Atrioventricular block, complete: Secondary | ICD-10-CM | POA: Diagnosis not present

## 2022-06-01 DIAGNOSIS — I951 Orthostatic hypotension: Secondary | ICD-10-CM | POA: Diagnosis not present

## 2022-06-01 DIAGNOSIS — G20A1 Parkinson's disease without dyskinesia, without mention of fluctuations: Secondary | ICD-10-CM | POA: Diagnosis not present

## 2022-06-01 DIAGNOSIS — E119 Type 2 diabetes mellitus without complications: Secondary | ICD-10-CM | POA: Diagnosis not present

## 2022-06-02 ENCOUNTER — Telehealth: Payer: Self-pay | Admitting: Internal Medicine

## 2022-06-02 NOTE — Telephone Encounter (Signed)
Copied from [place] [phone]. Topic: Medicare AWV >> Jun 02, 2022 11:56 AM Devoria Glassing wrote: Reason for CRM: Called patient to schedule Medicare Annual Wellness Visit (AWV). Left message for patient to call back and schedule Medicare Annual Wellness Visit (AWV).  Last date of AWV: 09/07/2014   Please schedule an appointment at any time with Beatris Ship, [place] .  If any questions, please contact me.  Thank you ,  Sherol Dade; [place] Direct Dial: [phone]

## 2022-06-03 ENCOUNTER — Telehealth: Payer: Self-pay | Admitting: Pharmacist

## 2022-06-03 NOTE — Telephone Encounter (Signed)
Received call from patient's daughter William Hamburger. She would like to know about resources regarding meals or assistance in the home once patient is released from rehab (anticipated around 06/26/2022).  Usually TOC team will outreach patient's when discharge from hospital or rehab.  Will forward request to [facility] nurse case manager to follow up with patient / daughter.  Not sure if patient's HTA plan might have meal delivery service benefit.

## 2022-06-04 ENCOUNTER — Telehealth: Payer: Self-pay

## 2022-06-04 NOTE — Telephone Encounter (Signed)
   Telephone encounter was:  Unsuccessful.  06/04/2022 Name: Minyon SHULAMIT CALLIHAN MRN: [id number] DOB: 1945/05/17  Unsuccessful outbound call made today to assist with:  Food Insecurity  Outreach Attempt:  1st Attempt  A HIPAA compliant voice message was left requesting a return call.  Instructed patient to call back .   [place] [phone] [address], [place], [place] [address] Phone: [phone] Email: [email or web address]

## 2022-06-05 ENCOUNTER — Telehealth: Payer: Self-pay

## 2022-06-05 NOTE — Telephone Encounter (Signed)
    Telephone encounter was:  Unsuccessful.  06/05/2022 Name: Mandy Durham MRN: [id number] DOB: Apr 11, 1945  Unsuccessful outbound call made today to assist with:  Food Insecurity  Outreach Attempt:  2nd Attempt  A HIPAA compliant voice message was left requesting a return call.  Instructed patient to call back   [place] [phone] [address], [place], [place] [address] Phone: [phone] Email: [email or web address]

## 2022-06-09 ENCOUNTER — Telehealth: Payer: Self-pay

## 2022-06-09 NOTE — Telephone Encounter (Signed)
   Telephone encounter was:  Successful.  06/09/2022 Name: Mandy Durham MRN: [id number] DOB: February 05, 1946  [name] is a 77 y.o. year old female who is a primary care patient of Larose Kells, Alda Berthold, MD . The community resource team was consulted for assistance with [place] guide performed the following interventions: Patient provided with information about care guide support team and interviewed to confirm resource needs.Patient has not been discharged for rehab yet so moms meals will not be delivered until she has been discharged. the Patient and the husband as been added to the wait list for [facility]   Follow Up Plan:  No further follow up planned at this time. The patient has been provided with needed resources.    [place] [phone] [address], [place], [place] [address] Phone: [phone] Email: [email or web address]

## 2022-06-10 ENCOUNTER — Telehealth: Payer: Self-pay | Admitting: *Deleted

## 2022-06-10 NOTE — Progress Notes (Signed)
  Care Coordination   Note   06/10/2022 Name: Mandy Durham MRN: [id number] DOB: Mar 18, 1945  Mandy Durham is a 77 y.o. year old female who sees Larose Kells, Alda Berthold, MD for primary care. I reached out to Mandy Durham by phone today to offer care coordination services.  [name] was given information about Care Coordination services today including:   The Care Coordination services include support from the care team which includes your Nurse Coordinator, Clinical Social Worker, or Pharmacist.  The Care Coordination team is here to help remove barriers to the health concerns and goals most important to you. Care Coordination services are voluntary, and the patient may decline or stop services at any time by request to their care team member.   Care Coordination Consent Status: [name] agrees. Pt is currently in rehab for the next 30 days. Christy requests call back mid to late April to schedule   Follow up plan:  The care guide will reach out to the patient again over the next 20 days.  Encounter Outcome:  Pt. Request to Call [place], [place] Direct Dial: [phone]

## 2022-06-12 DIAGNOSIS — G20C Parkinsonism, unspecified: Secondary | ICD-10-CM | POA: Diagnosis not present

## 2022-06-12 DIAGNOSIS — G243 Spasmodic torticollis: Secondary | ICD-10-CM | POA: Diagnosis not present

## 2022-06-15 DIAGNOSIS — M6281 Muscle weakness (generalized): Secondary | ICD-10-CM | POA: Diagnosis not present

## 2022-06-15 DIAGNOSIS — F8081 Childhood onset fluency disorder: Secondary | ICD-10-CM | POA: Diagnosis not present

## 2022-06-15 DIAGNOSIS — G20A1 Parkinson's disease without dyskinesia, without mention of fluctuations: Secondary | ICD-10-CM | POA: Diagnosis not present

## 2022-06-15 DIAGNOSIS — R627 Adult failure to thrive: Secondary | ICD-10-CM | POA: Diagnosis not present

## 2022-06-15 DIAGNOSIS — R278 Other lack of coordination: Secondary | ICD-10-CM | POA: Diagnosis not present

## 2022-06-19 DIAGNOSIS — G20A1 Parkinson's disease without dyskinesia, without mention of fluctuations: Secondary | ICD-10-CM | POA: Diagnosis not present

## 2022-06-19 DIAGNOSIS — E785 Hyperlipidemia, unspecified: Secondary | ICD-10-CM | POA: Diagnosis not present

## 2022-06-19 DIAGNOSIS — I5032 Chronic diastolic (congestive) heart failure: Secondary | ICD-10-CM | POA: Diagnosis not present

## 2022-06-19 DIAGNOSIS — I951 Orthostatic hypotension: Secondary | ICD-10-CM | POA: Diagnosis not present

## 2022-06-19 NOTE — Progress Notes (Signed)
  Care Coordination Note  06/19/2022 Name: Mandy Durham MRN: [id number] DOB: 1945/06/17  Mandy Durham is a 77 y.o. year old female who is a primary care patient of Wanda Plump, MD and is actively engaged with the care management team. I reached out to Mandy Durham by phone today to assist with scheduling an initial visit with the RN Case Manager and Licensed Clinical Social Worker  Follow up plan: Telephone appointment with care management team member scheduled for: 06/22/2022 and 06/23/2022  Mandy Durham, [facility] Care Coordination Care Guide Direct Dial: [phone]

## 2022-06-20 DIAGNOSIS — K30 Functional dyspepsia: Secondary | ICD-10-CM | POA: Diagnosis not present

## 2022-06-20 DIAGNOSIS — E785 Hyperlipidemia, unspecified: Secondary | ICD-10-CM | POA: Diagnosis not present

## 2022-06-20 DIAGNOSIS — M810 Age-related osteoporosis without current pathological fracture: Secondary | ICD-10-CM | POA: Diagnosis not present

## 2022-06-20 DIAGNOSIS — I2510 Atherosclerotic heart disease of native coronary artery without angina pectoris: Secondary | ICD-10-CM | POA: Diagnosis not present

## 2022-06-20 DIAGNOSIS — I110 Hypertensive heart disease with heart failure: Secondary | ICD-10-CM | POA: Diagnosis not present

## 2022-06-20 DIAGNOSIS — M479 Spondylosis, unspecified: Secondary | ICD-10-CM | POA: Diagnosis not present

## 2022-06-20 DIAGNOSIS — I441 Atrioventricular block, second degree: Secondary | ICD-10-CM | POA: Diagnosis not present

## 2022-06-20 DIAGNOSIS — I472 Ventricular tachycardia, unspecified: Secondary | ICD-10-CM | POA: Diagnosis not present

## 2022-06-20 DIAGNOSIS — I5032 Chronic diastolic (congestive) heart failure: Secondary | ICD-10-CM | POA: Diagnosis not present

## 2022-06-20 DIAGNOSIS — M40209 Unspecified kyphosis, site unspecified: Secondary | ICD-10-CM | POA: Diagnosis not present

## 2022-06-20 DIAGNOSIS — G4700 Insomnia, unspecified: Secondary | ICD-10-CM | POA: Diagnosis not present

## 2022-06-20 DIAGNOSIS — G243 Spasmodic torticollis: Secondary | ICD-10-CM | POA: Diagnosis not present

## 2022-06-20 DIAGNOSIS — F419 Anxiety disorder, unspecified: Secondary | ICD-10-CM | POA: Diagnosis not present

## 2022-06-20 DIAGNOSIS — F32A Depression, unspecified: Secondary | ICD-10-CM | POA: Diagnosis not present

## 2022-06-20 DIAGNOSIS — I951 Orthostatic hypotension: Secondary | ICD-10-CM | POA: Diagnosis not present

## 2022-06-20 DIAGNOSIS — G928 Other toxic encephalopathy: Secondary | ICD-10-CM | POA: Diagnosis not present

## 2022-06-20 DIAGNOSIS — R7303 Prediabetes: Secondary | ICD-10-CM | POA: Diagnosis not present

## 2022-06-20 DIAGNOSIS — M533 Sacrococcygeal disorders, not elsewhere classified: Secondary | ICD-10-CM | POA: Diagnosis not present

## 2022-06-20 DIAGNOSIS — G5793 Unspecified mononeuropathy of bilateral lower limbs: Secondary | ICD-10-CM | POA: Diagnosis not present

## 2022-06-20 DIAGNOSIS — G20A1 Parkinson's disease without dyskinesia, without mention of fluctuations: Secondary | ICD-10-CM | POA: Diagnosis not present

## 2022-06-20 DIAGNOSIS — E440 Moderate protein-calorie malnutrition: Secondary | ICD-10-CM | POA: Diagnosis not present

## 2022-06-20 DIAGNOSIS — J45909 Unspecified asthma, uncomplicated: Secondary | ICD-10-CM | POA: Diagnosis not present

## 2022-06-20 DIAGNOSIS — R627 Adult failure to thrive: Secondary | ICD-10-CM | POA: Diagnosis not present

## 2022-06-20 DIAGNOSIS — M19019 Primary osteoarthritis, unspecified shoulder: Secondary | ICD-10-CM | POA: Diagnosis not present

## 2022-06-22 ENCOUNTER — Ambulatory Visit: Payer: Self-pay | Admitting: Licensed Clinical Social Worker

## 2022-06-22 DIAGNOSIS — E440 Moderate protein-calorie malnutrition: Secondary | ICD-10-CM

## 2022-06-22 NOTE — Patient Outreach (Signed)
  Care Coordination  Initial Visit Note   06/22/2022 Name: Mandy Durham MRN: [id number] DOB: 1946-03-10  Mandy Durham is a 77 y.o. year old female who sees Mandy Durham, Mandy Rod, MD for primary care. I spoke with  Mandy Durham by phone today.  What matters to the patients health and wellness today?  Husband and patient unsure at this time.  Patient was accompanied by her husband Mandy Durham who provided information during this encounter. Patient and husband report things are as well as expected. Per husband Home Health will start tomorrow. .  Husband report being overwhelmed with trying to manage patient's upcoming appointments as patient usually handles this.  Response to most questions asked were " I don't know"  They continue to eat fast food most days.  States they have two daughter's however they are very busy but try to come by once a week to help as much as they can.   Goals Addressed             This Visit's Progress    Mom's Meals       Activities and task to complete in order to accomplish goals.   I have placed a referral to the community resource care guide they will call to get you connected with Mom's Meals.       SDOH assessments and interventions completed:  No  Care Coordination Interventions:  Yes, provided  Interventions Today    Flowsheet Row Most Recent Value  Chronic Disease   Chronic disease during today's visit Congestive Heart Failure (CHF), Hypertension (HTN)  General Interventions   General Interventions Discussed/Reviewed [employer], Communication with, Level of Care, General Interventions Discussed, Doctor Visits  [mom's meals referral]  Doctor Visits Discussed/Reviewed Doctor Visits Discussed  [reviewed upcoming appointments]  Communication with PCP/Specialists, RN  [and careguide]       Follow up plan: Patient and husband declined follow up call from LCSW.  However will f/u in 1 to 2 weeks based on needs after RN phone call tomorrow. No follow up  scheduled, LCSW will continue to collaborate with PCP and RN care manager    Encounter Outcome:  Pt. Visit Completed   Sammuel Hines, LCSW Social Work Care Coordination  [facility]ie [employer] [employer] [phone]

## 2022-06-22 NOTE — Patient Instructions (Signed)
Visit Information  Thank you for taking time to visit with me today. Please don't hesitate to contact me if I can be of assistance to you.   Following are the goals we discussed today:   Goals Addressed             This Visit's Progress    Mom's Meals       Activities and task to complete in order to accomplish goals.   I have placed a referral to the community resource care guide they will call to get you connected with [employer]'s Meals.       Please call the care guide team at [phone] if you need to cancel or reschedule your appointment.    The patient verbalized understanding of instructions, educational materials, and care plan provided today and DECLINED offer to receive copy of patient instructions, educational materials, and care plan.   LCSW will continue to collaborate with PCP & RN Care manager  Sammuel Hines, LCSW Social Work Care Coordination  [facility] [employer] [employer] [phone]

## 2022-06-23 ENCOUNTER — Other Ambulatory Visit: Payer: Self-pay

## 2022-06-23 ENCOUNTER — Encounter (HOSPITAL_COMMUNITY): Payer: Self-pay

## 2022-06-23 ENCOUNTER — Emergency Department (HOSPITAL_COMMUNITY)
Admission: EM | Admit: 2022-06-23 | Discharge: 2022-06-23 | Disposition: A | Discharge status: Home / Self Care | Payer: PPO | Attending: Emergency Medicine | Admitting: Emergency Medicine

## 2022-06-23 ENCOUNTER — Emergency Department (HOSPITAL_COMMUNITY): Payer: PPO

## 2022-06-23 ENCOUNTER — Telehealth: Payer: Self-pay | Admitting: Internal Medicine

## 2022-06-23 ENCOUNTER — Ambulatory Visit: Payer: Self-pay

## 2022-06-23 DIAGNOSIS — I10 Essential (primary) hypertension: Secondary | ICD-10-CM | POA: Insufficient documentation

## 2022-06-23 DIAGNOSIS — Z7982 Long term (current) use of aspirin: Secondary | ICD-10-CM | POA: Diagnosis not present

## 2022-06-23 DIAGNOSIS — I959 Hypotension, unspecified: Secondary | ICD-10-CM

## 2022-06-23 LAB — CBC
HCT: 36.9 % (ref 36.0–46.0)
Hemoglobin: 12.2 g/dL (ref 12.0–15.0)
MCH: 29.3 pg (ref 26.0–34.0)
MCHC: 33.1 g/dL (ref 30.0–36.0)
MCV: 88.5 fL (ref 80.0–100.0)
Platelets: 318 10*3/uL (ref 150–400)
RBC: 4.17 MIL/uL (ref 3.87–5.11)
RDW: 15.2 % (ref 11.5–15.5)
WBC: 6.8 10*3/uL (ref 4.0–10.5)
nRBC: 0 % (ref 0.0–0.2)

## 2022-06-23 LAB — BASIC METABOLIC PANEL
Anion gap: 13 (ref 5–15)
BUN: 28 mg/dL — ABNORMAL HIGH (ref 8–23)
CO2: 21 mmol/L — ABNORMAL LOW (ref 22–32)
Calcium: 9 mg/dL (ref 8.9–10.3)
Chloride: 103 mmol/L (ref 98–111)
Creatinine, Ser: 0.73 mg/dL (ref 0.44–1.00)
GFR, Estimated: >60 mL/min (ref >60)
Glucose, Bld: 74 mg/dL (ref 70–99)
Potassium: 4 mmol/L (ref 3.5–5.1)
Sodium: 137 mmol/L (ref 135–145)

## 2022-06-23 LAB — TROPONIN I (HIGH SENSITIVITY)
Troponin I (High Sensitivity): 6 ng/L (ref <18)
Troponin I (High Sensitivity): 6 ng/L (ref <18)

## 2022-06-23 NOTE — ED Provider Notes (Signed)
[place] EMERGENCY DEPARTMENT AT [facility] Provider Note   CSN: [id number] Arrival date & time: 06/23/22  1513     History  Chief Complaint  Patient presents with   Hypotension    Mandy Durham is a 77 y.o. female.  HPI Patient with reported low blood pressure.  Reportedly told to come in by home health for low blood pressure.  However blood pressure here upon arrival was 113 systolic.  Has gone up since.  No hypertension.  Patient states she feels fine.  No lightheadedness or dizziness for me.  Patient's husband is with her states she is at her baseline.  No fevers or chills.  Is had a good appetite.   Past Medical History:  Diagnosis Date   Anxiety state, unspecified    Hyperlipidemia    Hypertension    Insomnia    LBBB (left bundle branch block)    LHC in 2002 showed normal coronaries.    Osteoarthrosis, unspecified whether generalized or localized, unspecified site    Overweight(278.02)    Parkinson disease    S/P placement of cardiac pacemaker    a. 02/21/16: Medtronic Advisa DR MRI SureScan model [id number] (serial number [id number])    Serrated adenoma of colon 2007   Symptomatic menopausal or female climacteric states    Syncope and collapse    11/12: Holter 12/12 with rare PACS, HR range 64-140, average 82, no significant arrhythmias. Echo (1/13): EF 50-55%, mild LVH, septal-lateral dyssynchrony c/w LBBB. 3-week event monitor (1/13): No significant arrhythmia.    Ventricular tachycardia 05/24/2022   Patient with 3:1 AVB with pacemaker. She is noted to be tachycardic on telemetry but is asymptomatic  Plan    Home Medications Prior to Admission medications   Medication Sig Start Date End Date Taking? Authorizing Provider  acetaminophen (TYLENOL) 500 MG tablet Take 500 mg by mouth daily as needed for moderate pain.    [provider]  albuterol (PROVENTIL) (2.5 MG/3ML) 0.083% nebulizer solution Take 3 mLs (2.5 mg total) by nebulization every 2  (two) hours as needed for wheezing. 03/26/22   Joseph Art, DO  aspirin EC 81 MG tablet Take 81 mg by mouth daily. Swallow whole.    [provider]  Budeson-Glycopyrrol-Formoterol (BREZTRI AEROSPHERE) 160-9-4.8 MCG/ACT AERO Inhale 2 puffs into the lungs in the morning and at bedtime. Patient taking differently: Inhale 2 puffs into the lungs daily as needed (shortness of breath). 08/12/21   [name], Lesia Sago, MD  carbidopa-levodopa (SINEMET IR) 25-100 MG tablet Take 1 tablet by mouth 4 times daily at 8am/noon/4pm/8pm Patient taking differently: Take 1 tablet by mouth 4 (four) times daily. 07/30/21   Tat, Octaviano Batty, DO  Cholecalciferol (VITAMIN D PO) Take 1 tablet by mouth 2 (two) times a week. Vitamin D Patient not taking: Reported on 05/25/2022    [provider]  escitalopram (LEXAPRO) 10 MG tablet Take 1 tablet (10 mg total) by mouth daily. 05/20/22   Wanda Plump, MD  ezetimibe (ZETIA) 10 MG tablet Take 1 tablet (10 mg total) by mouth daily. 01/22/21   Wanda Plump, MD  lactose free nutrition (BOOST PLUS) LIQD Take 237 mLs by mouth 2 (two) times daily between meals. 03/26/22   Joseph Art, DO  nitroGLYCERIN (NITROSTAT) 0.4 MG SL tablet Place 1 tablet (0.4 mg total) under the tongue every 5 (five) minutes x 3 doses as needed for chest pain. 08/26/21   [name], PA-C  Allergies    Patient has no known allergies.    Review of Systems   Review of Systems  Physical Exam Updated Vital Signs BP (!) 179/80   Pulse 73   Temp 98 F (36.7 C) (Oral)   Resp 18   Ht 5\' 7"  (1.702 m)   Wt 48.5 kg   SpO2 100%   BMI 16.75 kg/m  Physical Exam Vitals and nursing note reviewed.  Eyes:     Pupils: Pupils are equal, round, and reactive to light.  Cardiovascular:     Rate and Rhythm: Regular rhythm.  Abdominal:     Tenderness: There is no abdominal tenderness.  Musculoskeletal:        General: No tenderness.     Cervical back: Neck supple.  Skin:    General:  Skin is warm.  Neurological:     Mental Status: She is alert. Mental status is at baseline.     ED Results / Procedures / Treatments   Labs (all labs ordered are listed, but only abnormal results are displayed) Labs Reviewed  BASIC METABOLIC PANEL - Abnormal; Notable for the following components:      Result Value   CO2 21 (*)    BUN 28 (*)    All other components within normal limits  CBC  TROPONIN I (HIGH SENSITIVITY)  TROPONIN I (HIGH SENSITIVITY)    EKG EKG Interpretation  Date/Time:  Tuesday June 23 2022 17:53:46 EDT Ventricular Rate:  158 PR Interval:  270 QRS Duration: 159 QT Interval:  447 QTC Calculation: 785 R Axis:   173 Text Interpretation: Atrial-sensed ventricular-paced rhythm Confirmed by Benjiman Core [phone]) on 06/23/2022 6:34:53 PM  Radiology DG Chest 2 View  Result Date: 06/23/2022 CLINICAL DATA:  Hypotension. EXAM: CHEST - 2 VIEW COMPARISON:  May 24, 2022. FINDINGS: The heart size and mediastinal contours are within normal limits. Left-sided pacemaker is unchanged in position. Both lungs are clear. The visualized skeletal structures are unremarkable. IMPRESSION: No active cardiopulmonary disease. Electronically Signed   By: Lupita Raider M.D.   On: 06/23/2022 16:27    Procedures Procedures    Medications Ordered in ED Medications - No data to display  ED Course/ Medical Decision Making/ A&P                             Medical Decision Making Amount and/or Complexity of Data Reviewed Labs: ordered. Radiology: ordered.   Patient with reported hypotension at home.  Normal BP here although first 1 is on the slightly lower side. Repeat BP normal.  Blood work reassuring.  Patient without any complaints.  Will discharge home.  Does not appear to need more extensive workup at this time.        Final Clinical Impression(s) / ED Diagnoses Final diagnoses:  Hypotension, unspecified hypotension type    Rx / DC Orders ED Discharge  Orders     None         Benjiman Core, MD 06/23/22 2323

## 2022-06-23 NOTE — Patient Instructions (Addendum)
Visit Information  Thank you for taking time to visit with me today. Please don't hesitate to contact me if I can be of assistance to you.   Following are the goals we discussed today:   Goals Addressed             This Visit's Progress    Care Coordination Activities       Interventions Today    Flowsheet Row Most Recent Value  Chronic Disease   Chronic disease during today's visit Other  [05/24/22-05/29/22 altered mental status]  General Interventions   General Interventions Discussed/Reviewed General Interventions Discussed, Communication with  [possible home care/level of care needs, home health SW ordered per PCP]  Communication with PCP/Specialists, Social Work  [per review of chart low BP. PCP office recommend to [facility] for patient go to ED, but CMA wanted to speak with husband prior. Secure message to Conrad [place] (CMA) to notify that RNCM's had husband on the line confirmed to have husband take pt. to ED]  Education Interventions   Education Provided Provided Education  Provided Verbal Education On Other  [informed husband per PCP office recommendation to take patient to ED to have BP evaluated. encouraged to use wheelchair for transporting patient]  Safety Interventions   Safety Discussed/Reviewed Safety Discussed            If you are experiencing a Mental Health or Behavioral Health Crisis or need someone to talk to, please call the Suicide and Crisis Lifeline: 74  Kathyrn Sheriff, RN, MSN, BSN, [employer] [phone]

## 2022-06-23 NOTE — Patient Outreach (Signed)
  Care Coordination   Initial Visit Note   06/23/2022 Name: Mandy Durham MRN: [id number] DOB: 1945/09/09  Mandy Durham is a 77 y.o. year old female who sees Mandy Durham, Mandy Rod, MD for primary care. I spoke with  Mandy Durham Husband/dpr) Mandy Durham by phone today.  What matters to the patients health and wellness today?  05/24/22-05/29/22 aacute metabolic encephalopathy in the setting of polypharmacy, parkinson's disease with autonomic dysfunction. Noted per [facility] nurse documentation, BP low 80/60 per PCP office instructions to go to ED for evaluation of BP. When RNCM called for scheduled call, Mr. Penfold, he was not aware of the recommendation to take patient to the ED for BP evaluation. Husband states he will take Mandy Durham to the emergency room  Goals Addressed             This Visit's Progress    Care Coordination Activities       Interventions Today    Flowsheet Row Most Recent Value  Chronic Disease   Chronic disease during today's visit Other  General Interventions   General Interventions Discussed/Reviewed General Interventions Discussed  Communication with PCP/Specialists  [per review of chart low BP. PCP office recommend to [facility] for patient go to ED, but CMA wanted to speak with husband prior. Secure message to Conrad [place] (CMA) to notify that RNCM's had husband on the line confirmed to have husband take pt. to ED]  Education Interventions   Education Provided Provided Education  Provided Verbal Education On Other  [informed husband per PCP office recommendation to take patient to ED to have BP evaluated. encouraged to use wheelchair for transporting patient]  Safety Interventions   Safety Discussed/Reviewed Safety Discussed            SDOH assessments and interventions completed:  No  Care Coordination Interventions:  Yes, provided   Follow up plan:  RNCM will continue to follow    Encounter Outcome:  Pt. Visit Completed   Kathyrn Sheriff, RN, MSN, BSN, CCM [facility] Care  Coordinator [phone]

## 2022-06-23 NOTE — Telephone Encounter (Signed)
Caller/Agency: [facility]fra[facility] Kauf[facility]) Callback Number: [phone] Requesting OT/PT/Skilled Nursing/Social Work/Speech Therapy: OT, Skilled Nursing assessment for meds management Frequency: 2 w 3

## 2022-06-23 NOTE — Telephone Encounter (Signed)
Pt at ED.

## 2022-06-23 NOTE — Telephone Encounter (Signed)
Spoke w/ Mandy Durham- gave verbal orders for RN, OT, added social work. She is worried about Pt's BP- was 80/60 this morning. She has concerns about the Pt's home situation, Pt husband not much better health to take care of her. Mandy Durham does not cook, and Pt is unable to- they have been eating fast food for every meal daily. I brought Mandy Durham up to speed on Pt and situation- daughters are not involved in Pt or husband Mandy Durham's care- Dr. Drue Novel has spoke w/ both daughters multiple times w/o much improvement. I gave verbal orders to add social work- maybe their social work can help [place] in this situation. I've let Mandy Durham know that PCP is out of the office until 4/22 but that Pt does have a SNF discharge f/u w/ Mandy Durham on 4/12. Informed I'd call Pt and let her know she needs to go to ED for evaluation d/t low BP. Mandy Durham verbalized understanding.   LMOM for Pt and her husband Mandy Durham- informed I had talked w/ Mandy Durham and she informed of low BP- I instructed that they really need to go back to the ED for further evaluation but I would like to at least speak w/ her or Mandy Durham- I asked for a call back.

## 2022-06-23 NOTE — ED Triage Notes (Signed)
Pt had a visit by a home health nurse and told pt to come her for low blood pressure. Pt's husband can't remember what the reading was. Pt states she does feel a little dizzy. Pt denies any other sx.

## 2022-06-25 ENCOUNTER — Telehealth: Payer: Self-pay

## 2022-06-25 NOTE — Telephone Encounter (Signed)
   Telephone encounter was:  Successful.  06/25/2022 Name: Mandy Durham MRN: [id number] DOB: [birth year]  Mandy Durham is a 77 y.o. year old female who is a primary care patient of Drue Novel, Nolon Rod, MD . The community resource team was consulted for assistance with Food Insecurity  Care guide performed the following interventions: Patient provided with information about care guide support team and interviewed to confirm resource needs. Patient has been set up with moms meals with [employer]. HA will call and confirm delivery details with the Patient. 4 WEEKS OF MEALS   Follow Up Plan:  No further follow up planned at this time. The patient has been provided with needed resources.    Lenard Forth [facility] Guide, [facility] [phone] [address] [place], [place], [place] [address] Phone: [phone] Email: [email or web address]

## 2022-06-26 ENCOUNTER — Encounter: Payer: Self-pay | Admitting: Family

## 2022-06-26 ENCOUNTER — Other Ambulatory Visit (INDEPENDENT_AMBULATORY_CARE_PROVIDER_SITE_OTHER): Payer: PPO | Admitting: Pharmacist

## 2022-06-26 ENCOUNTER — Ambulatory Visit (INDEPENDENT_AMBULATORY_CARE_PROVIDER_SITE_OTHER): Payer: PPO | Admitting: Family

## 2022-06-26 VITALS — BP 152/88 | HR 75 | Ht 67.0 in | Wt 122.0 lb

## 2022-06-26 DIAGNOSIS — G249 Dystonia, unspecified: Secondary | ICD-10-CM | POA: Diagnosis not present

## 2022-06-26 DIAGNOSIS — G20C Parkinsonism, unspecified: Secondary | ICD-10-CM

## 2022-06-26 DIAGNOSIS — F419 Anxiety disorder, unspecified: Secondary | ICD-10-CM

## 2022-06-26 DIAGNOSIS — I10 Essential (primary) hypertension: Secondary | ICD-10-CM

## 2022-06-26 DIAGNOSIS — Z79899 Other long term (current) drug therapy: Secondary | ICD-10-CM

## 2022-06-26 NOTE — Progress Notes (Signed)
06/26/2022 Name: Mandy Durham MRN: [id number] DOB: 1945-10-12  Chief Complaint  Patient presents with   Medication Management    Initial visit    Mandy Durham is a 77 y.o. year old female who was referred for medication management by their primary care provider, Wanda Plump, MD. They presented for a face to face visit today.   They were referred to the pharmacist by care management and PCP for medication management.    Patient was recently discharged from rehab at [place]. Our office has not received any notes from [place] but patient brought in copy of med list provided to her at discharge.  Medications started during stay are Lacinda Axon were:  Methocarbamol 500mg  - take 0.5 tablet = 250mg  daily Hydroxyzine 10mg  take 1 tablet twice a day  Subjective:  Care Team: Primary Care Provider: Wanda Plump, MD ; Next Scheduled Visit: 08/12/2022 Neurologist: Junie Spencer, NP; Next Scheduled Visit: 07/06/2022  Medication Access/Adherence  Current Pharmacy:  [facility] [address] ([address]), [place] - [address] [place] ([place]) [place] [address] Phone: [phone] Fax: [phone]   Patient reports affordability concerns with their medications: No  Patient reports access/transportation concerns to their pharmacy: Yes  Patient reports adherence concerns with their medications:  Yes  She and her husband are trying to manage medications on their own but her is low health literacy that makes this more difficult.    Depression/Anxiety  :  Current medications: escitalopram 10mg  daily  Was prescribed hydroxyzine 10mg  twice a day at [place] but patient states she has only taken once at home because it made her feel loopy.   Medications tried in the past: She was taking lorazepam for anxiety prior to recent hospitalization for altered mental status but this was discontinued at discharge.   Medication Management:  Reviewed notes from hospital  discharge and also from [facility] / SNF discharge.  Both lorazepam and cyclobenzaprine were stopped during hospitaliziton for altered mental status. While at [facility] for rehab hydroxyzine 10mg  twice a day and methocarbamol 500mg  - take 0.5 tablet = 250mg  up to 4 times a day for muscle spasms was started.   Patient reports she does not feel anxious and would prefer not to take hydroxyzine because it makes her feel loopy.  She also report she has not needed anything for muscle spasms recently.    Objective:  Lab Results  Component Value Date   HGBA1C 6.6 (H) 01/28/2022    Lab Results  Component Value Date   CREATININE 0.73 06/23/2022   BUN 28 (H) 06/23/2022   NA 137 06/23/2022   K 4.0 06/23/2022   CL 103 06/23/2022   CO2 21 (L) 06/23/2022    Lab Results  Component Value Date   CHOL 200 05/26/2021   HDL 108.70 05/26/2021   LDLCALC 76 05/26/2021   LDLDIRECT 106.9 11/03/2012   TRIG 75.0 05/26/2021   CHOLHDL 2 05/26/2021    Medications Reviewed Today     Reviewed by Henrene Pastor, RPH-CPP (Pharmacist) on 06/26/22 at 1424  Med List Status: <None>   Medication Order Taking? Sig Documenting Provider Last Dose Status Informant  acetaminophen (TYLENOL) 500 MG tablet [id number]  Take 500 mg by mouth daily as needed for moderate pain. [provider]  Active Spouse/Significant Other, Pharmacy Records           Med Note [facility], Karlene Lineman Mar 19, 2022  7:05 AM)    aspirin EC 81  MG tablet [id number] Yes Take 81 mg by mouth daily. Swallow whole. [provider] Taking Active Spouse/Significant Other, Pharmacy Records  Budeson-Glycopyrrol-Formoterol (BREZTRI AEROSPHERE) 160-9-4.8 MCG/ACT AERO [id number] Yes Inhale 2 puffs into the lungs in the morning and at bedtime.  Patient taking differently: Inhale 2 puffs into the lungs daily as needed (shortness of breath).   [name], Lesia Sago, MD Taking Active Spouse/Significant Other, Pharmacy Records  carbidopa-levodopa  (SINEMET IR) 25-100 MG tablet [id number] Yes Take 1 tablet by mouth 4 times daily at 8am/noon/4pm/8pm  Patient taking differently: Take 1 tablet by mouth 4 (four) times daily.   Vladimir Faster, DO Taking Active Spouse/Significant Other, Pharmacy Records  Cholecalciferol (VITAMIN D PO) [id number] Yes Take 1 tablet by mouth 2 (two) times a week. Vitamin D [provider] Taking Active Spouse/Significant Other, Pharmacy Records  escitalopram (LEXAPRO) 10 MG tablet [id number] Yes Take 1 tablet (10 mg total) by mouth daily. Wanda Plump, MD Taking Active Spouse/Significant Other, Pharmacy Records  ezetimibe (ZETIA) 10 MG tablet [id number] Yes Take 1 tablet (10 mg total) by mouth daily. Wanda Plump, MD Taking Active Spouse/Significant Other, Pharmacy Records  hydrOXYzine (ATARAX) 10 MG tablet [id number] No Take 10 mg by mouth 2 (two) times daily.  Patient not taking: Reported on 06/26/2022   [provider] Not Taking Active   lactose free nutrition (BOOST PLUS) LIQD [id number] Yes Take 237 mLs by mouth 2 (two) times daily between meals. Joseph Art, DO Taking Active Spouse/Significant Other, Pharmacy Records  methocarbamol (ROBAXIN) 500 MG tablet [id number] No Take 250 mg by mouth 4 (four) times daily.  Patient not taking: Reported on 06/26/2022   [provider] Not Taking Active   nitroGLYCERIN (NITROSTAT) 0.4 MG SL tablet [id number] Yes Place 1 tablet (0.4 mg total) under the tongue every 5 (five) minutes x 3 doses as needed for chest pain. [name], PA-C Taking Active Spouse/Significant Other, Pharmacy Records           Med Note [facility], Karlene Lineman Mar 19, 2022  7:05 AM)                Assessment/Plan:   Depression/Anxiety: Currently controlled - Recommend to continue to hold hydroxyzine due to side effects / could cause AMS again - continue to take escitalopram   Medication Management:Currently strategy insufficient to maintain appropriate adherence to  prescribed medication regimen -Post-Discharge medication reconciliation was completed by myself. Reviewed both current EPIC medication list and medication list provided to patient at discharge from hospital and SNF.    - Suggested use of weekly pill box to organize medications. Filled 1 week of medications for patient. Will prepare a med list with picture that she and her husband can use. Follow up in 1 weeks to fill pill container again. - Recommended hold methocarbamol and only take 0.5 tablet if needed for muscle spasms.    Follow Up Plan: 1 week  Henrene Pastor, PharmD Clinical Pharmacist [facility]

## 2022-06-26 NOTE — Progress Notes (Signed)
Mandy Durham is a 77 y.o. female with the following history as recorded in EpicCare:  Patient Active Problem List   Diagnosis Date Noted   Ventricular tachycardia 05/24/2022   Dehydration 05/24/2022   Metabolic encephalopathy 05/24/2022   Encephalopathy, metabolic 05/24/2022   Malnutrition of moderate degree 03/20/2022   COVID-19 03/19/2022   Fluency disorder associated with underlying disease 12/18/2021   Diplopia 12/18/2021   Cervical dystonia 12/18/2021   Dyskinesia 12/18/2021   Neuropathy involving both lower extremities 06/09/2021   Complaints of weakness of lower extremity 06/09/2021   Abnormal brain MRI 06/09/2021   Neck muscle weakness 06/02/2021   Diastolic CHF 05/27/2021   Kyphosis due to degeneration of spine 05/05/2021   Drug-induced orofacial dyskinesia 05/05/2021   Hyperkinesis 05/05/2021   Traumatic intracerebral hemorrhage with loss of consciousness of 31 minutes to 59 minutes 05/05/2021   Stammering and stuttering 05/05/2021   Atypical parkinsonism 09/07/2018   Functional dyspepsia 09/08/2017   Slow transit constipation 09/08/2017   S/P placement of cardiac pacemaker    Mobitz II 02/20/2016   Nystagmus, end-position 02/12/2016   Dizziness and giddiness 02/12/2016   Flapping tremor 02/12/2016   Chronic fatigue 10/28/2015   PCP NOTES >>>> 02/13/2015   TBI (traumatic brain injury) 01/28/2015   Essential hypertension    Acute post-traumatic headache, not intractable    Vertigo due to brain injury    SAH (subarachnoid hemorrhage) 01/21/2015   Annual physical exam 09/07/2014   Orthostatic hypotension 08/30/2014   Acromioclavicular arthrosis 03/21/2014   Allergic rhinitis 02/27/2014   SI (sacroiliac) joint dysfunction 02/02/2014   Gastrocnemius strain, left 12/11/2013   Back pain 08/22/2013   LBBB (left bundle branch block) 05/13/2013   Borderline diabetes    Insomnia 01/12/2008   HLD (hyperlipidemia) 02/04/2007   Anxiety 10/12/2006   Osteoarthritis  10/12/2006    Current Outpatient Medications  Medication Sig Dispense Refill   acetaminophen (TYLENOL) 500 MG tablet Take 500 mg by mouth daily as needed for moderate pain.     aspirin EC 81 MG tablet Take 81 mg by mouth daily. Swallow whole.     Budeson-Glycopyrrol-Formoterol (BREZTRI AEROSPHERE) 160-9-4.8 MCG/ACT AERO Inhale 2 puffs into the lungs in the morning and at bedtime. (Patient taking differently: Inhale 2 puffs into the lungs daily as needed (shortness of breath).) 10.7 g 11   carbidopa-levodopa (SINEMET IR) 25-100 MG tablet Take 1 tablet by mouth 4 times daily at 8am/noon/4pm/8pm (Patient taking differently: Take 1 tablet by mouth 4 (four) times daily.) 360 tablet 1   escitalopram (LEXAPRO) 10 MG tablet Take 1 tablet (10 mg total) by mouth daily. 90 tablet 1   ezetimibe (ZETIA) 10 MG tablet Take 1 tablet (10 mg total) by mouth daily. 90 tablet 1   lactose free nutrition (BOOST PLUS) LIQD Take 237 mLs by mouth 2 (two) times daily between meals.  0   nitroGLYCERIN (NITROSTAT) 0.4 MG SL tablet Place 1 tablet (0.4 mg total) under the tongue every 5 (five) minutes x 3 doses as needed for chest pain. 25 tablet 1   Cholecalciferol (VITAMIN D PO) Take 1 tablet by mouth 2 (two) times a week. Vitamin D     hydrOXYzine (ATARAX) 10 MG tablet Take 10 mg by mouth 2 (two) times daily. (Patient not taking: Reported on 06/26/2022)     methocarbamol (ROBAXIN) 500 MG tablet Take 250 mg by mouth 4 (four) times daily. (Patient not taking: Reported on 06/26/2022)     No current facility-administered medications for this visit.  Allergies: Patient has no known allergies.  Past Medical History:  Diagnosis Date   Anxiety state, unspecified    Hyperlipidemia    Hypertension    Insomnia    LBBB (left bundle branch block)    LHC in 2002 showed normal coronaries.    Osteoarthrosis, unspecified whether generalized or localized, unspecified site    Overweight(278.02)    Parkinson disease    S/P placement  of cardiac pacemaker    a. 02/21/16: Medtronic Advisa DR MRI SureScan model Y0KH99 (serial number [id number])    Serrated adenoma of colon 2007   Symptomatic menopausal or female climacteric states    Syncope and collapse    11/12: Holter 12/12 with rare PACS, HR range 64-140, average 82, no significant arrhythmias. Echo (1/13): EF 50-55%, mild LVH, septal-lateral dyssynchrony c/w LBBB. 3-week event monitor (1/13): No significant arrhythmia.    Ventricular tachycardia 05/24/2022   Patient with 3:1 AVB with pacemaker. She is noted to be tachycardic on telemetry but is asymptomatic  Plan    Past Surgical History:  Procedure Laterality Date   COLONOSCOPY  2007   EP IMPLANTABLE DEVICE N/A 02/21/2016   Procedure: Pacemaker Implant;  Surgeon: Will Jorja Loa, MD;  Location: [facility];  Service: Cardiovascular;  Laterality: N/A;   POLYPECTOMY      Family History  Problem Relation Age of Onset   Heart failure Mother    Coronary artery disease Mother    Dementia Mother    Diabetes Mother    Colon cancer Neg Hx    Breast cancer Neg Hx    Hypertension Neg Hx    Heart disease Neg Hx    Heart attack Neg Hx    Stroke Neg Hx    Rectal cancer Neg Hx    Stomach cancer Neg Hx    Esophageal cancer Neg Hx    Liver disease Neg Hx    Parkinson's disease Neg Hx     Social History   Tobacco Use   Smoking status: Never    Passive exposure: Never   Smokeless tobacco: Never  Substance Use Topics   Alcohol use: No    Subjective:   Patient was released from SNF last Friday and appointment was requested by our pharmacist today to follow up on medications; patient has already met with pharmacist earlier today and medication changes are being made include to hold Hydroxyzine, Robaxin and only use prn. Patient went to ER on Tuesday with concerns for low blood pressure- her CBC and BMP were checked at that time; does have Parkinson's which suspect is contributing to blood pressure  fluctuations; Chronic care management is already in place for patient due to concerns for food insecurity/ limited ability to take care of herself; she has historically been primary caregiver for both herself and husband/ unfortunately, their children have not been able to offer support. There is also a social work referral that was placed earlier this week to evaluate safety of patient's current living situation.    Objective:  Vitals:   06/26/22 1355  BP: (!) 152/88  Pulse: 75  SpO2: 99%  Weight: 122 lb (55.3 kg)  Height: 5\' 7"  (1.702 m)    General: Well developed, well nourished, in no acute distress  Skin : Warm and dry.  Head: Normocephalic and atraumatic  Lungs: Respirations unlabored; clear to auscultation bilaterally without wheeze, rales, rhonchi  CVS exam: normal rate and regular rhythm.  Neurologic: Alert and oriented; speech intact; face symmetrical; tremors  noted  Assessment:  1. Dyskinesia   2. Essential hypertension     Plan:  Immediately prior to visit with me today, patient had visit with our pharmacist and extensive medication review updated; CBC, BMP were checked earlier this week at ER; social work referral was put in place earlier this week; she is scheduled to see her neurologist in 10 days and recommend to see her PCP in about 1 month, sooner prn.   Return in about 1 month (around 07/26/2022) for follow up with Dr. Paz/ 40 minutes.  No orders of the defined types were placed in this encounter.   Requested Prescriptions    No prescriptions requested or ordered in this encounter

## 2022-06-29 ENCOUNTER — Telehealth: Payer: Self-pay

## 2022-06-29 ENCOUNTER — Telehealth: Payer: Self-pay | Admitting: Internal Medicine

## 2022-06-29 DIAGNOSIS — E440 Moderate protein-calorie malnutrition: Secondary | ICD-10-CM | POA: Diagnosis not present

## 2022-06-29 DIAGNOSIS — I5032 Chronic diastolic (congestive) heart failure: Secondary | ICD-10-CM | POA: Diagnosis not present

## 2022-06-29 DIAGNOSIS — J45909 Unspecified asthma, uncomplicated: Secondary | ICD-10-CM | POA: Diagnosis not present

## 2022-06-29 DIAGNOSIS — M19019 Primary osteoarthritis, unspecified shoulder: Secondary | ICD-10-CM | POA: Diagnosis not present

## 2022-06-29 DIAGNOSIS — M40209 Unspecified kyphosis, site unspecified: Secondary | ICD-10-CM | POA: Diagnosis not present

## 2022-06-29 DIAGNOSIS — I472 Ventricular tachycardia, unspecified: Secondary | ICD-10-CM | POA: Diagnosis not present

## 2022-06-29 DIAGNOSIS — M810 Age-related osteoporosis without current pathological fracture: Secondary | ICD-10-CM | POA: Diagnosis not present

## 2022-06-29 DIAGNOSIS — G4700 Insomnia, unspecified: Secondary | ICD-10-CM | POA: Diagnosis not present

## 2022-06-29 DIAGNOSIS — I441 Atrioventricular block, second degree: Secondary | ICD-10-CM | POA: Diagnosis not present

## 2022-06-29 DIAGNOSIS — F419 Anxiety disorder, unspecified: Secondary | ICD-10-CM | POA: Diagnosis not present

## 2022-06-29 DIAGNOSIS — I110 Hypertensive heart disease with heart failure: Secondary | ICD-10-CM | POA: Diagnosis not present

## 2022-06-29 DIAGNOSIS — F32A Depression, unspecified: Secondary | ICD-10-CM | POA: Diagnosis not present

## 2022-06-29 DIAGNOSIS — G5793 Unspecified mononeuropathy of bilateral lower limbs: Secondary | ICD-10-CM | POA: Diagnosis not present

## 2022-06-29 DIAGNOSIS — E785 Hyperlipidemia, unspecified: Secondary | ICD-10-CM | POA: Diagnosis not present

## 2022-06-29 DIAGNOSIS — I2510 Atherosclerotic heart disease of native coronary artery without angina pectoris: Secondary | ICD-10-CM | POA: Diagnosis not present

## 2022-06-29 DIAGNOSIS — M533 Sacrococcygeal disorders, not elsewhere classified: Secondary | ICD-10-CM | POA: Diagnosis not present

## 2022-06-29 DIAGNOSIS — R627 Adult failure to thrive: Secondary | ICD-10-CM | POA: Diagnosis not present

## 2022-06-29 DIAGNOSIS — M479 Spondylosis, unspecified: Secondary | ICD-10-CM | POA: Diagnosis not present

## 2022-06-29 DIAGNOSIS — I951 Orthostatic hypotension: Secondary | ICD-10-CM | POA: Diagnosis not present

## 2022-06-29 DIAGNOSIS — G20A1 Parkinson's disease without dyskinesia, without mention of fluctuations: Secondary | ICD-10-CM | POA: Diagnosis not present

## 2022-06-29 DIAGNOSIS — G243 Spasmodic torticollis: Secondary | ICD-10-CM | POA: Diagnosis not present

## 2022-06-29 DIAGNOSIS — K30 Functional dyspepsia: Secondary | ICD-10-CM | POA: Diagnosis not present

## 2022-06-29 DIAGNOSIS — R7303 Prediabetes: Secondary | ICD-10-CM | POA: Diagnosis not present

## 2022-06-29 DIAGNOSIS — G928 Other toxic encephalopathy: Secondary | ICD-10-CM | POA: Diagnosis not present

## 2022-06-29 NOTE — Telephone Encounter (Signed)
Mandy Durham with [facility] called to follow up on orders that were faxed on 06/23/22 and 06/24/22. Advised that Provider has been out of the office since 06/22/22 so he has not had a chance to sign. Advised provider will be back next week.

## 2022-06-29 NOTE — Telephone Encounter (Signed)
Spoke w/ social worker w/ [facility]- verbal orders given for social work.

## 2022-06-30 DIAGNOSIS — E440 Moderate protein-calorie malnutrition: Secondary | ICD-10-CM | POA: Diagnosis not present

## 2022-06-30 DIAGNOSIS — I441 Atrioventricular block, second degree: Secondary | ICD-10-CM | POA: Diagnosis not present

## 2022-06-30 DIAGNOSIS — G243 Spasmodic torticollis: Secondary | ICD-10-CM | POA: Diagnosis not present

## 2022-06-30 DIAGNOSIS — M479 Spondylosis, unspecified: Secondary | ICD-10-CM | POA: Diagnosis not present

## 2022-06-30 DIAGNOSIS — G4700 Insomnia, unspecified: Secondary | ICD-10-CM | POA: Diagnosis not present

## 2022-06-30 DIAGNOSIS — G20A1 Parkinson's disease without dyskinesia, without mention of fluctuations: Secondary | ICD-10-CM | POA: Diagnosis not present

## 2022-06-30 DIAGNOSIS — I5032 Chronic diastolic (congestive) heart failure: Secondary | ICD-10-CM | POA: Diagnosis not present

## 2022-06-30 DIAGNOSIS — G5793 Unspecified mononeuropathy of bilateral lower limbs: Secondary | ICD-10-CM | POA: Diagnosis not present

## 2022-06-30 DIAGNOSIS — F419 Anxiety disorder, unspecified: Secondary | ICD-10-CM | POA: Diagnosis not present

## 2022-06-30 DIAGNOSIS — F32A Depression, unspecified: Secondary | ICD-10-CM | POA: Diagnosis not present

## 2022-06-30 DIAGNOSIS — M40209 Unspecified kyphosis, site unspecified: Secondary | ICD-10-CM | POA: Diagnosis not present

## 2022-06-30 DIAGNOSIS — R627 Adult failure to thrive: Secondary | ICD-10-CM | POA: Diagnosis not present

## 2022-06-30 DIAGNOSIS — I2510 Atherosclerotic heart disease of native coronary artery without angina pectoris: Secondary | ICD-10-CM | POA: Diagnosis not present

## 2022-06-30 DIAGNOSIS — I110 Hypertensive heart disease with heart failure: Secondary | ICD-10-CM | POA: Diagnosis not present

## 2022-06-30 DIAGNOSIS — I951 Orthostatic hypotension: Secondary | ICD-10-CM | POA: Diagnosis not present

## 2022-06-30 DIAGNOSIS — G928 Other toxic encephalopathy: Secondary | ICD-10-CM | POA: Diagnosis not present

## 2022-06-30 DIAGNOSIS — M19019 Primary osteoarthritis, unspecified shoulder: Secondary | ICD-10-CM | POA: Diagnosis not present

## 2022-06-30 DIAGNOSIS — M533 Sacrococcygeal disorders, not elsewhere classified: Secondary | ICD-10-CM | POA: Diagnosis not present

## 2022-06-30 DIAGNOSIS — I472 Ventricular tachycardia, unspecified: Secondary | ICD-10-CM | POA: Diagnosis not present

## 2022-06-30 DIAGNOSIS — K30 Functional dyspepsia: Secondary | ICD-10-CM | POA: Diagnosis not present

## 2022-06-30 DIAGNOSIS — M810 Age-related osteoporosis without current pathological fracture: Secondary | ICD-10-CM | POA: Diagnosis not present

## 2022-06-30 DIAGNOSIS — J45909 Unspecified asthma, uncomplicated: Secondary | ICD-10-CM | POA: Diagnosis not present

## 2022-06-30 DIAGNOSIS — E785 Hyperlipidemia, unspecified: Secondary | ICD-10-CM | POA: Diagnosis not present

## 2022-06-30 DIAGNOSIS — R7303 Prediabetes: Secondary | ICD-10-CM | POA: Diagnosis not present

## 2022-07-03 ENCOUNTER — Other Ambulatory Visit (INDEPENDENT_AMBULATORY_CARE_PROVIDER_SITE_OTHER): Payer: PPO | Admitting: Pharmacist

## 2022-07-03 DIAGNOSIS — Z79899 Other long term (current) drug therapy: Secondary | ICD-10-CM

## 2022-07-03 DIAGNOSIS — G20C Parkinsonism, unspecified: Secondary | ICD-10-CM

## 2022-07-03 NOTE — Progress Notes (Unsigned)
07/03/2022 Name: Mandy Durham MRN: [id number] DOB: 1945-03-21  No chief complaint on file.   Mandy Durham is a 77 y.o. year old female who was referred for medication management by their primary care provider, Wanda Plump, MD. They presented for a face to face visit today.   They were referred to the pharmacist by care management and PCP for medication management.    Patient was recently discharged from rehab at [facility]. Our office has not received any notes from [facility] but patient brought in copy of med list provided to her at discharge.  Medications started during stay are Lacinda Axon were:  Methocarbamol  - take 0.5 tablet =  daily Hydroxyzine  take 1 tablet twice a day  Subjective:  Care Team: Primary Care Provider: Wanda Plump, MD ; Next Scheduled Visit: 08/12/2022 Neurologist: Junie Spencer, NP; Next Scheduled Visit: 07/06/2022  Medication Access/Adherence  Current Pharmacy:  [facility] [address] ([address]), [place] - [address]  ([address]) [place] [address] Phone: [phone] Fax: [phone]   Patient reports affordability concerns with their medications: No  Patient reports access/transportation concerns to their pharmacy: Yes  Patient reports adherence concerns with their medications:  Yes  She and her husband are trying to manage medications on their own but her is low health literacy that makes this more difficult.    Depression/Anxiety  :  Current medications: escitalopram  daily  Was prescribed hydroxyzine  twice a day at [place] but patient states she has only taken once at home because it made her feel loopy.   Medications tried in the past: She was taking lorazepam for anxiety prior to recent hospitalization for altered mental status but this was discontinued at discharge.   Medication Management:  Reviewed notes from hospital discharge and also from [facility] / SNF discharge.  Both  lorazepam and cyclobenzaprine were stopped during hospitaliziton for altered mental status. While at [facility] for rehab hydroxyzine  twice a day and methocarbamol  - take 0.5 tablet =  up to 4 times a day for muscle spasms was started.   Patient reports she does not feel anxious and would prefer not to take hydroxyzine because it makes her feel loopy.  She also report she has not needed anything for muscle spasms recently.    Objective:  Lab Results  Component Value Date   HGBA1C 6.6 (H) 01/28/2022    Lab Results  Component Value Date   CREATININE 0.73 06/23/2022   BUN 28 (H) 06/23/2022   NA 137 06/23/2022   K 4.0 06/23/2022   CL 103 06/23/2022   CO2 21 (L) 06/23/2022    Lab Results  Component Value Date   CHOL 200 05/26/2021   HDL 108.70 05/26/2021   LDLCALC 76 05/26/2021   LDLDIRECT 106.9 11/03/2012   TRIG 75.0 05/26/2021   CHOLHDL 2 05/26/2021    Medications Reviewed Today     Reviewed by Henrene Pastor, RPH-CPP (Pharmacist) on 06/26/22 at 1424  Med List Status: <None>   Medication Order Taking? Sig Documenting Provider Last Dose Status Informant  acetaminophen (TYLENOL) 500 MG tablet [id number]  Take 500 mg by mouth daily as needed for moderate pain. [provider]  Active Spouse/Significant Other, Pharmacy Records           Med Note [facility], Karlene Lineman Mar 19, 2022  7:05 AM)    aspirin EC 81 MG tablet [id number] Yes Take 81 mg by mouth daily. Swallow  whole. [provider] Taking Active Spouse/Significant Other, Pharmacy Records  Budeson-Glycopyrrol-Formoterol (BREZTRI AEROSPHERE) 160-9-4.8 MCG/ACT AERO [id number] Yes Inhale 2 puffs into the lungs in the morning and at bedtime.  Patient taking differently: Inhale 2 puffs into the lungs daily as needed (shortness of breath).   [name], Lesia Sago, MD Taking Active Spouse/Significant Other, Pharmacy Records  carbidopa-levodopa (SINEMET IR) 25-100 MG tablet [id number] Yes Take 1 tablet by  mouth 4 times daily at 8am/noon/4pm/8pm  Patient taking differently: Take 1 tablet by mouth 4 (four) times daily.   Vladimir Faster, DO Taking Active Spouse/Significant Other, Pharmacy Records  Cholecalciferol (VITAMIN D PO) [id number] Yes Take 1 tablet by mouth 2 (two) times a week. Vitamin D [provider] Taking Active Spouse/Significant Other, Pharmacy Records  escitalopram (LEXAPRO) 10 MG tablet [id number] Yes Take 1 tablet (10 mg total) by mouth daily. Wanda Plump, MD Taking Active Spouse/Significant Other, Pharmacy Records  ezetimibe (ZETIA) 10 MG tablet [id number] Yes Take 1 tablet (10 mg total) by mouth daily. Wanda Plump, MD Taking Active Spouse/Significant Other, Pharmacy Records  hydrOXYzine (ATARAX) 10 MG tablet [id number] No Take 10 mg by mouth 2 (two) times daily.  Patient not taking: Reported on 06/26/2022   [provider] Not Taking Active   lactose free nutrition (BOOST PLUS) LIQD [id number] Yes Take 237 mLs by mouth 2 (two) times daily between meals. Joseph Art, DO Taking Active Spouse/Significant Other, Pharmacy Records  methocarbamol (ROBAXIN) 500 MG tablet [id number] No Take 250 mg by mouth 4 (four) times daily.  Patient not taking: Reported on 06/26/2022   [provider] Not Taking Active   nitroGLYCERIN (NITROSTAT) 0.4 MG SL tablet [id number] Yes Place 1 tablet (0.4 mg total) under the tongue every 5 (five) minutes x 3 doses as needed for chest pain. [name], PA-C Taking Active Spouse/Significant Other, Pharmacy Records           Med Note [facility], Karlene Lineman Mar 19, 2022  7:05 AM)                Assessment/Plan:   Depression/Anxiety: Currently controlled - Recommend to continue to hold hydroxyzine due to side effects / could cause AMS again - continue to take escitalopram   Medication Management:Currently strategy insufficient to maintain appropriate adherence to prescribed medication regimen -Post-Discharge medication  reconciliation was completed by myself. Reviewed both current EPIC medication list and medication list provided to patient at discharge from hospital and SNF.    - Suggested use of weekly pill box to organize medications. Filled 1 week of medications for patient. Will prepare a med list with picture that she and her husband can use. Follow up in 1 weeks to fill pill container again. - Recommended hold methocarbamol and only take 0.5 tablet if needed for muscle spasms.    Follow Up Plan: 1 week  Henrene Pastor, PharmD Clinical Pharmacist [facility]

## 2022-07-05 ENCOUNTER — Other Ambulatory Visit: Payer: Self-pay | Admitting: Pharmacist

## 2022-07-06 ENCOUNTER — Telehealth: Payer: Self-pay | Admitting: Pharmacist

## 2022-07-06 ENCOUNTER — Ambulatory Visit: Payer: PPO | Admitting: Adult Health

## 2022-07-06 ENCOUNTER — Encounter: Payer: Self-pay | Admitting: Adult Health

## 2022-07-06 MED ORDER — TRELEGY ELLIPTA 100-62.5-25 MCG/ACT IN AEPB: 1.0000 | INHALATION_SPRAY | Freq: Every day | RESPIRATORY_TRACT | 0 refills | Status: AC

## 2022-07-06 NOTE — Telephone Encounter (Signed)
Left patient a sample for Trelegy to try in place of Breztri since she was having difficulty with the Metered dose inhaler.  Provided written / picture instructions for patient as well.

## 2022-07-07 ENCOUNTER — Telehealth: Payer: Self-pay

## 2022-07-07 NOTE — Telephone Encounter (Signed)
Mandy Durham with [facility] called to verify that the Robaxin on her medication list.   It says "not taking on the med list" but pt is having a lot of pain. So they would like to know if she can take this.   CB number: [phone].

## 2022-07-07 NOTE — Telephone Encounter (Signed)
Multiple orders received from [facility]. Forms signed and faxed back to [phone]. Forms sent for scanning.   Order numbers [id number], [id number], [id number], [id number], [id number], [phone]

## 2022-07-07 NOTE — Telephone Encounter (Signed)
Please advise 

## 2022-07-08 NOTE — Addendum Note (Signed)
Addended by: Wanda Plump on: 07/08/2022 09:54 AM   Modules accepted: Orders

## 2022-07-08 NOTE — Telephone Encounter (Signed)
For pain recommend Tylenol 500 mg 1 or 2 tablets every 8 hours. Robaxin has a potential for sedation and increase her risk of falls.  Will remove from list.

## 2022-07-08 NOTE — Telephone Encounter (Signed)
Spoke w/ Ilean [employer]- informed of PCP recommendations. Kameron verbalized understanding, she will discuss w/ Pt.

## 2022-07-09 NOTE — Telephone Encounter (Signed)
Mandy Durham with [facility] called to confirm the PCP recommendations. Confirmed notes from [place]. Mandy Durham stated understanding

## 2022-07-10 ENCOUNTER — Encounter: Payer: Self-pay | Admitting: Pharmacist

## 2022-07-10 ENCOUNTER — Other Ambulatory Visit (INDEPENDENT_AMBULATORY_CARE_PROVIDER_SITE_OTHER): Payer: PPO | Admitting: Pharmacist

## 2022-07-10 VITALS — BP 138/80

## 2022-07-10 DIAGNOSIS — Z79899 Other long term (current) drug therapy: Secondary | ICD-10-CM

## 2022-07-10 DIAGNOSIS — F419 Anxiety disorder, unspecified: Secondary | ICD-10-CM | POA: Diagnosis not present

## 2022-07-10 DIAGNOSIS — G20C Parkinsonism, unspecified: Secondary | ICD-10-CM | POA: Diagnosis not present

## 2022-07-10 NOTE — Progress Notes (Signed)
07/10/2022 Name: Mandy Durham MRN: [id number] DOB: Mar 24, 1945  Chief Complaint  Patient presents with   Medication Management    Mandy Durham is a 77 y.o. year old female who was referred for medication management by their primary care provider, Wanda Plump, MD. They presented for a face to face  / home visit today.   They were referred to the pharmacist by care management and PCP for medication management.   Subjective:  Care Team: Primary Care Provider: Wanda Plump, MD ; Next Scheduled Visit: 08/12/2022 Neurologist: Junie Spencer, NP; Next Scheduled Visit: 07/06/2022  Visited patient 07/03/2022 and filled weekly medication container.  Patient has taken all her medications this week.    Neck pain:  She was instructed to stop methocarbamol 07/09/2022. She was instructed that for neck / shoulder pain she could take acetaminophen 500 - 1000mg  up to every 8 hours if needed but she was unsure about taking the acetaminophen so she has not started yet.  She reports neck pain is worse during daytime.   Physical therapy has been out once this week 07/09/2022 and provided exercises for neck / shoulder pain.  Patient is using Biofreeze 3 or 4 times a day but reports it is not helping much.    Patient was discharged from rehab at [facility] bout 3 weeks ago.  Medications started during stay are Lacinda Axon were:  Methocarbamol 500mg  - take 0.5 tablet = 250mg  daily Hydroxyzine 10mg  take 1 tablet twice a day However at her last office visit for SNF follow up she was told to hold methocarbamol and hydroxyzine due to risk of altered mental status.   Medication Access/Adherence  Current Pharmacy:  [facility] [address] ([address]),  - [address] [place] ([place]) [place] [address] Phone: [phone] Fax: [phone]   Patient reports affordability concerns with their medications: No  Patient reports access/transportation concerns to their pharmacy: Yes   Patient reports adherence concerns with their medications:  No  - Patient has used the weekly pill container that we filled for her last week. All doses have been taken.   She was having difficulty manipulating the  Breztri MDI inhaler so I left a sample of Trelegy at our office for her. Her husband was supppose to pick up Tuesday 04/23 but when I asked about Trelegy today, she reports she has not started because Mr. Rossetti was not able to get by to pick up the sample.   Barriers to medication adherence:  Multiple comorbidities Low health literacy Poor knowledge of the illness and medication. Difficult to administer medication.   Depression/Anxiety  :  Current medications: escitalopram 10mg  daily   Other medications tried: Was prescribed hydroxyzine 10mg  twice a day at [place] but patient states she has only taken once at home because it made her feel loopy.  Took lorazepam for anxiety prior to recent hospitalization for altered mental status but this was discontinued at discharge.    Objective:  Lab Results  Component Value Date   HGBA1C 6.6 (H) 01/28/2022    Lab Results  Component Value Date   CREATININE 0.73 06/23/2022   BUN 28 (H) 06/23/2022   NA 137 06/23/2022   K 4.0 06/23/2022   CL 103 06/23/2022   CO2 21 (L) 06/23/2022    Lab Results  Component Value Date   CHOL 200 05/26/2021   HDL 108.70 05/26/2021   LDLCALC 76 05/26/2021   LDLDIRECT 106.9 11/03/2012   TRIG 75.0 05/26/2021  CHOLHDL 2 05/26/2021    Medications Reviewed Today     Reviewed by Henrene Pastor, RPH-CPP (Pharmacist) on 07/05/22 at 1824  Med List Status: <None>   Medication Order Taking? Sig Documenting Provider Last Dose Status Informant  acetaminophen (TYLENOL) 500 MG tablet [id number] Yes Take 500 mg by mouth daily as needed for moderate pain. [provider] Taking Active Spouse/Significant Other, Pharmacy Records           Med Note [facility], Karlene Lineman Mar 19, 2022  7:05 AM)     aspirin EC 81 MG tablet [id number] Yes Take 81 mg by mouth daily. Swallow whole. [provider] Taking Active Spouse/Significant Other, Pharmacy Records  Budeson-Glycopyrrol-Formoterol (BREZTRI AEROSPHERE) 160-9-4.8 MCG/ACT AERO [id number] Yes Inhale 2 puffs into the lungs in the morning and at bedtime. [name], Lesia Sago, MD Taking Active Spouse/Significant Other, Pharmacy Records  carbidopa-levodopa (SINEMET IR) 25-100 MG tablet [id number] Yes Take 1 tablet by mouth 4 times daily at 8am/noon/4pm/8pm  Patient taking differently: Take 1 tablet by mouth 4 (four) times daily. Take 1 tablet by mouth 4 times daily at 8am/noon/4pm/8pm   Tat, Octaviano Batty, DO Taking Active Spouse/Significant Other, Pharmacy Records  Cholecalciferol (VITAMIN D PO) [id number] No Take 1 tablet by mouth 2 (two) times a week. Vitamin D  Patient not taking: Reported on 07/05/2022   [provider] Not Taking Active Spouse/Significant Other, Pharmacy Records  escitalopram (LEXAPRO) 10 MG tablet [id number] Yes Take 1 tablet (10 mg total) by mouth daily. Wanda Plump, MD Taking Active Spouse/Significant Other, Pharmacy Records  ezetimibe (ZETIA) 10 MG tablet [id number] Yes Take 1 tablet (10 mg total) by mouth daily. Wanda Plump, MD Taking Active Spouse/Significant Other, Pharmacy Records  hydrOXYzine (ATARAX) 10 MG tablet [id number] No Take 10 mg by mouth 2 (two) times daily.  Patient not taking: Reported on 07/05/2022   [provider] Not Taking Active   lactose free nutrition (BOOST PLUS) LIQD [id number] Yes Take 237 mLs by mouth 2 (two) times daily between meals. Joseph Art, DO Taking Active Spouse/Significant Other, Pharmacy Records  methocarbamol (ROBAXIN) 500 MG tablet [id number] No Take 250 mg by mouth 4 (four) times daily.  Patient not taking: Reported on 07/05/2022   [provider] Not Taking Active   nitroGLYCERIN (NITROSTAT) 0.4 MG SL tablet [id number] Yes Place 1 tablet (0.4 mg total)  under the tongue every 5 (five) minutes x 3 doses as needed for chest pain. [name], PA-C Taking Active Spouse/Significant Other, Pharmacy Records           Med Note [facility], Karlene Lineman Mar 19, 2022  7:05 AM)                Assessment/Plan:   Neck Pain:  - Discussed that she could take 500 to 1000mg  of acetaminophen as needed up to every 8 hours for neck pain. We decided to put a 500mg  tablet in with her morning medications so she is getting at least once dose each morning. She will take additional tablets if needed.  - Reviewed exercises that physical therapist provided for neck and shoulder. Reminded patient she should do these exercises 3 times a day.   Depression/Anxiety: Currently controlled - Recommend to continue to hold hydroxyzine due to side effects / could cause AMS again - continue to take escitalopram   Medication Management:Current adherence strategy sufficient  - Continue to use weekly pill box to organize medications. Filled 1 week of  medications for patient.  - Provided a med list at last visit with picture that she and her husband can use and reviewed.  - Will bring sample of Trelegy inhaler when I am out to see patient next week unless her husband is able to come pick up sample sooner.   Follow Up Plan: 1 week - home visit to assist patient in filling weekly pill container.   Henrene Pastor, PharmD Clinical Pharmacist [employer] [facility]

## 2022-07-13 DIAGNOSIS — G243 Spasmodic torticollis: Secondary | ICD-10-CM | POA: Diagnosis not present

## 2022-07-13 DIAGNOSIS — G20C Parkinsonism, unspecified: Secondary | ICD-10-CM | POA: Diagnosis not present

## 2022-07-15 ENCOUNTER — Telehealth: Payer: Self-pay | Admitting: Pharmacist

## 2022-07-15 ENCOUNTER — Telehealth: Payer: Self-pay

## 2022-07-15 DIAGNOSIS — M810 Age-related osteoporosis without current pathological fracture: Secondary | ICD-10-CM | POA: Diagnosis not present

## 2022-07-15 DIAGNOSIS — G20A1 Parkinson's disease without dyskinesia, without mention of fluctuations: Secondary | ICD-10-CM | POA: Diagnosis not present

## 2022-07-15 DIAGNOSIS — G243 Spasmodic torticollis: Secondary | ICD-10-CM | POA: Diagnosis not present

## 2022-07-15 DIAGNOSIS — E440 Moderate protein-calorie malnutrition: Secondary | ICD-10-CM | POA: Diagnosis not present

## 2022-07-15 DIAGNOSIS — R7303 Prediabetes: Secondary | ICD-10-CM | POA: Diagnosis not present

## 2022-07-15 DIAGNOSIS — G928 Other toxic encephalopathy: Secondary | ICD-10-CM | POA: Diagnosis not present

## 2022-07-15 DIAGNOSIS — J45909 Unspecified asthma, uncomplicated: Secondary | ICD-10-CM | POA: Diagnosis not present

## 2022-07-15 DIAGNOSIS — M479 Spondylosis, unspecified: Secondary | ICD-10-CM | POA: Diagnosis not present

## 2022-07-15 DIAGNOSIS — M19019 Primary osteoarthritis, unspecified shoulder: Secondary | ICD-10-CM | POA: Diagnosis not present

## 2022-07-15 DIAGNOSIS — I2510 Atherosclerotic heart disease of native coronary artery without angina pectoris: Secondary | ICD-10-CM | POA: Diagnosis not present

## 2022-07-15 DIAGNOSIS — M40209 Unspecified kyphosis, site unspecified: Secondary | ICD-10-CM | POA: Diagnosis not present

## 2022-07-15 DIAGNOSIS — G5793 Unspecified mononeuropathy of bilateral lower limbs: Secondary | ICD-10-CM | POA: Diagnosis not present

## 2022-07-15 DIAGNOSIS — G4700 Insomnia, unspecified: Secondary | ICD-10-CM | POA: Diagnosis not present

## 2022-07-15 DIAGNOSIS — K30 Functional dyspepsia: Secondary | ICD-10-CM | POA: Diagnosis not present

## 2022-07-15 DIAGNOSIS — F419 Anxiety disorder, unspecified: Secondary | ICD-10-CM | POA: Diagnosis not present

## 2022-07-15 DIAGNOSIS — F32A Depression, unspecified: Secondary | ICD-10-CM | POA: Diagnosis not present

## 2022-07-15 DIAGNOSIS — I110 Hypertensive heart disease with heart failure: Secondary | ICD-10-CM | POA: Diagnosis not present

## 2022-07-15 DIAGNOSIS — I951 Orthostatic hypotension: Secondary | ICD-10-CM | POA: Diagnosis not present

## 2022-07-15 DIAGNOSIS — I5032 Chronic diastolic (congestive) heart failure: Secondary | ICD-10-CM | POA: Diagnosis not present

## 2022-07-15 DIAGNOSIS — E785 Hyperlipidemia, unspecified: Secondary | ICD-10-CM | POA: Diagnosis not present

## 2022-07-15 DIAGNOSIS — R627 Adult failure to thrive: Secondary | ICD-10-CM | POA: Diagnosis not present

## 2022-07-15 DIAGNOSIS — I472 Ventricular tachycardia, unspecified: Secondary | ICD-10-CM | POA: Diagnosis not present

## 2022-07-15 DIAGNOSIS — I441 Atrioventricular block, second degree: Secondary | ICD-10-CM | POA: Diagnosis not present

## 2022-07-15 DIAGNOSIS — M533 Sacrococcygeal disorders, not elsewhere classified: Secondary | ICD-10-CM | POA: Diagnosis not present

## 2022-07-15 NOTE — Telephone Encounter (Signed)
Mandy Durham, not sure what to do with this situation, I believe with doing everything we can from our side. Do you think is a good idea to call [employer]PS?

## 2022-07-15 NOTE — Telephone Encounter (Signed)
   Telephone encounter was:  Successful.  07/15/2022 Name: Mandy Durham MRN: [id number] DOB: 09-20-1945  Mandy Durham is a 77 y.o. year old female who is a primary care patient of Drue Novel, Nolon Rod, MD . The community resource team was consulted for assistance with Food Insecurity  Care guide performed the following interventions: Patient provided with information about care guide support team and interviewed to confirm resource needs.Follow up call to patient about concerns from Pharmacy team and Moms Meals. Moms meals were delivered and patient stated she has no other needs or concerns at this time   Follow Up Plan:  No further follow up planned at this time. The patient has been provided with needed resources.    Lenard Forth [facility] Guide, [facility] [phone] [address] [place], [place], [place] [address] Phone: [phone] Email: [email or web address]
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
## 2022-07-15 NOTE — Telephone Encounter (Signed)
Received fax this morning from [facility]- social work ordered 2 visit, they were able to complete 1 visit in the home w/ Pt (unsure what day)- no notes received regarding this. Fax today informed that social work attempted 2nd visit on 07/10/22 for subsequent visit but there was no answer at door/phone.

## 2022-07-17 ENCOUNTER — Other Ambulatory Visit: Payer: PPO | Admitting: Pharmacist

## 2022-07-17 NOTE — Progress Notes (Signed)
07/17/2022 Name: Mandy Durham MRN: [id number] DOB: 20-May-1945  Chief Complaint  Patient presents with   Medication Management    Mandy Durham is a 77 y.o. year old female who was referred for medication management by their primary care provider, Wanda Plump, MD. They presented for a face to face  / home visit today.   Mandy Durham was referred to the pharmacist by care management and PCP for medication management.   Subjective:  Care Team: Primary Care Provider: Wanda Plump, MD ; Next Scheduled Visit: 08/12/2022 Neurologist: Junie Spencer, NP; Next Scheduled Visit: no visit currently scheduled  Visited patient weekly for the last to week to fill weekly medication container. Noted this week that she missed 1 dose of carbidopa / levodopa and also missed 1 dose of aspirin.     Neck pain:  She reports neck pain has improved with taking acetaminophen. She was been taking either 500mg  or 650mg  tablets 3 or 4 times per day.    Patient is using Biofreeze as needed  Patient was discharged from rehab at [place] about 3 weeks ago.  Medications started during stay are Lacinda Axon were:  Methocarbamol 500mg  - take 0.5 tablet = 250mg  daily Hydroxyzine 10mg  take 1 tablet twice a day However at her last office visit for SNF follow up she was told to hold methocarbamol and hydroxyzine due to risk of altered mental status.   Medication Access/Adherence  Current Pharmacy:  [facility] [address] ([address]), [place] - [address] [place] (SE) [place] [address] Phone: [phone] Fax: [phone]   Patient reports affordability concerns with their medications: No  Patient reports access/transportation concerns to their pharmacy: Yes  Patient reports adherence concerns with their medications:  No  - Patient has used the weekly pill container that we filled for her last week.    She was having difficulty manipulating the  Breztri MDI inhaler. Brought sample of  Trelegy to her today to try.    There was some concern from patient's daughter that she was not getting need nutrition. Mandy Durham reports today that she has been drinking Ensure. She has Mom's meals that was provided by her Medicare plan [employer]. These meal are frozen and can be heated easily. She report she has  not tried them yet.    Barriers to medication adherence:  Multiple comorbidities Low health literacy Poor knowledge of the illness and medication. Difficult to administer medication.   Depression/Anxiety  :  Current medications: escitalopram 10mg  daily   Other medications tried: Was prescribed hydroxyzine 10mg  twice a day at [place] but patient states she has only taken once at home because it made her feel loopy.  Took lorazepam for anxiety prior to recent hospitalization for altered mental status but this was discontinued at discharge.    Objective:  Lab Results  Component Value Date   HGBA1C 6.6 (H) 01/28/2022    Lab Results  Component Value Date   CREATININE 0.73 06/23/2022   BUN 28 (H) 06/23/2022   NA 137 06/23/2022   K 4.0 06/23/2022   CL 103 06/23/2022   CO2 21 (L) 06/23/2022    Lab Results  Component Value Date   CHOL 200 05/26/2021   HDL 108.70 05/26/2021   LDLCALC 76 05/26/2021   LDLDIRECT 106.9 11/03/2012   TRIG 75.0 05/26/2021   CHOLHDL 2 05/26/2021    Medications Reviewed Today     Reviewed by Henrene Pastor, RPH-CPP (Pharmacist) on 07/17/22 at  1446  Med List Status: <None>   Medication Order Taking? Sig Documenting Provider Last Dose Status Informant  ACETAMINOPHEN PO [id number] Yes Take 650 mg by mouth every 6 (six) hours as needed for moderate pain. [provider] Taking Active Spouse/Significant Other, Pharmacy Records           Med Note [facility], Karlene Lineman Mar 19, 2022  7:05 AM)    aspirin EC 81 MG tablet [id number] Yes Take 81 mg by mouth daily. Swallow whole. [provider] Taking Active Spouse/Significant Other,  Pharmacy Records  carbidopa-levodopa (SINEMET IR) 25-100 MG tablet [id number] Yes Take 1 tablet by mouth 4 times daily at 8am/noon/4pm/8pm Tat, Octaviano Batty, DO Taking Active Spouse/Significant Other, Pharmacy Records  Cholecalciferol (VITAMIN D PO) [id number] No Take 1 tablet by mouth 2 (two) times a week. Vitamin D  Patient not taking: Reported on 07/17/2022   [provider] Not Taking Active Spouse/Significant Other, Pharmacy Records  escitalopram (LEXAPRO) 10 MG tablet [id number] Yes Take 1 tablet (10 mg total) by mouth daily. Wanda Plump, MD Taking Active Spouse/Significant Other, Pharmacy Records  ezetimibe (ZETIA) 10 MG tablet [id number] Yes Take 1 tablet (10 mg total) by mouth daily. Wanda Plump, MD Taking Active Spouse/Significant Other, Pharmacy Records  Fluticasone-Umeclidin-Vilant [facility] ELLIPTA) 100-62.5-25 MCG/ACT AEPB [id number] Yes Inhale 1 puff into the lungs daily. Wanda Plump, MD Taking Active   hydrOXYzine (ATARAX) 10 MG tablet [id number] Yes Take 10 mg by mouth 2 (two) times daily. [provider] Taking Active   lactose free nutrition (BOOST PLUS) LIQD [id number] Yes Take 237 mLs by mouth 2 (two) times daily between meals. Joseph Art, DO Taking Active Spouse/Significant Other, Pharmacy Records  Menthol, Topical Analgesic, (BIOFREEZE ROLL-ON) 4 % GEL [id number] Yes Apply topically as needed (for neck / shoulder pain). Use 3 or 4 times per day [provider] Taking Active   nitroGLYCERIN (NITROSTAT) 0.4 MG SL tablet [id number] Yes Place 1 tablet (0.4 mg total) under the tongue every 5 (five) minutes x 3 doses as needed for chest pain. [name], PA-C Taking Active Spouse/Significant Other, Pharmacy Records           Med Note [facility], Karlene Lineman Mar 19, 2022  7:05 AM)              SDOH Interventions Today    Flowsheet Row Most Recent Value  SDOH Interventions   Transportation Interventions Other (Comment)  [education patient and husband about  insurance transportation benefits.]       Assessment/Plan:   Neck Pain: improving - Continue acetaminophen 650mg  4 times a day.  - Reviewed exercises that physical therapist provided for neck and shoulder. Reminded patient she should do these exercises 3 times a day.   Depression/Anxiety: Currently controlled - Recommend to continue to hold hydroxyzine due to side effects / could cause AMS again - continue to take escitalopram   Medication Management:Current adherence strategy sufficient  - Continue to use weekly pill box to organize medications. Assisted patient in filling weekly medication containers.   - Provided a med list at previous visit with picture that she and her husband can use and reviewed.  - provided education on how to use Trelegy inhaler. Patient took first dose while I was in the home today.   Verified patient did receive Mom's Meals from [employer] but she has not tried yet.  Encouraged her to use these meals.   Follow Up Plan: 1  week  Henrene Pastor, PharmD Clinical Pharmacist [facility]

## 2022-07-24 ENCOUNTER — Other Ambulatory Visit: Payer: PPO | Admitting: Pharmacist

## 2022-07-24 ENCOUNTER — Telehealth: Payer: Self-pay | Admitting: Internal Medicine

## 2022-07-24 NOTE — Telephone Encounter (Signed)
Caller/Agency: [facility]fraim Kaufmann [facility])  Callback Number: [phone] ok to LDVM Requesting OT/PT/Skilled Nursing/Social Work/Speech Therapy: OT due to being overwhelmed w/ husband's fall Frequency: 1 w 3. 1 w 1

## 2022-07-24 NOTE — Telephone Encounter (Signed)
LMOM for Melissa w/ verbal orders.  

## 2022-07-27 ENCOUNTER — Telehealth: Payer: Self-pay | Admitting: Internal Medicine

## 2022-07-27 ENCOUNTER — Telehealth: Payer: Self-pay

## 2022-07-27 NOTE — Telephone Encounter (Signed)
Mandy Durham with [facility] called to advise that patient's husband fell and broke his hip and she wants to postpone OT/PT for 2 weeks. They will resume the week of 6/26. Her number, if needed, [phone]

## 2022-07-27 NOTE — Telephone Encounter (Signed)
Plan of care signed and faxed back to [facility] at [phone]. Order number [id number]. Form sent for scanning.

## 2022-07-27 NOTE — Telephone Encounter (Signed)
Daughter, Neysa Bonito, wanted to speak to Tammy regarding mom (pt) and dad, Link Snuffer. Please give her a call

## 2022-07-27 NOTE — Telephone Encounter (Signed)
Just an FYI

## 2022-07-28 NOTE — Telephone Encounter (Signed)
Spoke with patient's daughter. She wanted to let me know that her father - Mandy Durham was having hip revision surgery today. Mandy Durham will be staying with her brother and sister in law for the next few days.  Mandy Durham and her sister plan to see Mandy Durham today or tomorrow.

## 2022-07-28 NOTE — Progress Notes (Signed)
07/28/2022 Name: Mandy Durham MRN: [id number] DOB: [birth year]  No chief complaint on file.   Mandy Durham is a 77 y.o. year old female who was referred for medication management by their primary care provider, Mandy Plump, MD. They presented for a face to face  / home visit today.   [name] was referred to the pharmacist by care management and PCP for medication management.   Subjective:  Care Team: Primary Care Provider: Wanda Plump, MD ; Next Scheduled Visit: 08/12/2022 Neurologist: Mandy Spencer, NP; Next Scheduled Visit: no visit currently scheduled  Visited patient weekly for the last 2 weeks to fill weekly medication container. Noted this week that she did not miss any doses of medications. Though she does report that she removed some doses of acetaminophen because she felt that every 6 hours was making her a little dizzy.     Neck pain:  She reports neck pain has improved with taking acetaminophen. She wanted to try taking 650mg  4 times per day last week but today she states she feels like 4 tabs per day was too much.    Patient is using Biofreeze as needed  Patient was discharged from rehab at [place] about 3 weeks ago.  Medications started during stay are Lacinda Axon were:  Methocarbamol 500mg  - take 0.5 tablet = 250mg  daily Hydroxyzine 10mg  take 1 tablet twice a day However at her last office visit for SNF follow up she was told to hold methocarbamol and hydroxyzine due to risk of altered mental status.   Medication Access/Adherence  Current Pharmacy:  [facility] [address] ([address]),  - [address] [place] (SE) [place] [address] Phone: [phone] Fax: [phone]   Patient reports affordability concerns with their medications: No  Patient reports access/transportation concerns to their pharmacy: Yes  Patient reports adherence concerns with their medications:  No  - Patient has used the weekly pill container that we  filled for her last week.    She reports Trelegy inhaler has been easier to use but looks like she has only taken 2 or 3 doses over the last 7 days.      Barriers to medication adherence:  Multiple comorbidities Low health literacy Poor knowledge of the illness and medication. Difficult to administer medication.   Depression/Anxiety  :  Current medications: escitalopram 10mg  daily   Other medications tried: Was prescribed hydroxyzine 10mg  twice a day at [place] but patient states she has only taken once at home because it made her feel loopy.  Took lorazepam for anxiety prior to recent hospitalization for altered mental status but this was discontinued at discharge.    Objective:  Lab Results  Component Value Date   HGBA1C 6.6 (H) 01/28/2022    Lab Results  Component Value Date   CREATININE 0.73 06/23/2022   BUN 28 (H) 06/23/2022   NA 137 06/23/2022   K 4.0 06/23/2022   CL 103 06/23/2022   CO2 21 (L) 06/23/2022    Lab Results  Component Value Date   CHOL 200 05/26/2021   HDL 108.70 05/26/2021   LDLCALC 76 05/26/2021   LDLDIRECT 106.9 11/03/2012   TRIG 75.0 05/26/2021   CHOLHDL 2 05/26/2021    Medications Reviewed Today     Reviewed by Henrene Pastor, RPH-CPP (Pharmacist) on 07/17/22 at 1446  Med List Status: <None>   Medication Order Taking? Sig Documenting Provider Last Dose Status Informant  ACETAMINOPHEN PO [id number] Yes Take 650 mg by  mouth every 6 (six) hours as needed for moderate pain. [provider] Taking Active Spouse/Significant Other, Pharmacy Records           Med Note [facility], Karlene Lineman Mar 19, 2022  7:05 AM)    aspirin EC 81 MG tablet [id number] Yes Take 81 mg by mouth daily. Swallow whole. [provider] Taking Active Spouse/Significant Other, Pharmacy Records  carbidopa-levodopa (SINEMET IR) 25-100 MG tablet [id number] Yes Take 1 tablet by mouth 4 times daily at 8am/noon/4pm/8pm Tat, Octaviano Batty, DO Taking Active  Spouse/Significant Other, Pharmacy Records  Cholecalciferol (VITAMIN D PO) [id number] No Take 1 tablet by mouth 2 (two) times a week. Vitamin D  Patient not taking: Reported on 07/17/2022   [provider] Not Taking Active Spouse/Significant Other, Pharmacy Records  escitalopram (LEXAPRO) 10 MG tablet [id number] Yes Take 1 tablet (10 mg total) by mouth daily. Mandy Plump, MD Taking Active Spouse/Significant Other, Pharmacy Records  ezetimibe (ZETIA) 10 MG tablet [id number] Yes Take 1 tablet (10 mg total) by mouth daily. Mandy Plump, MD Taking Active Spouse/Significant Other, Pharmacy Records  Fluticasone-Umeclidin-Vilant [facility] ELLIPTA) 100-62.5-25 MCG/ACT AEPB [id number] Yes Inhale 1 puff into the lungs daily. Mandy Plump, MD Taking Active   hydrOXYzine (ATARAX) 10 MG tablet [id number] Yes Take 10 mg by mouth 2 (two) times daily. [provider] Taking Active   lactose free nutrition (BOOST PLUS) LIQD [id number] Yes Take 237 mLs by mouth 2 (two) times daily between meals. Joseph Art, DO Taking Active Spouse/Significant Other, Pharmacy Records  Menthol, Topical Analgesic, (BIOFREEZE ROLL-ON) 4 % GEL [id number] Yes Apply topically as needed (for neck / shoulder pain). Use 3 or 4 times per day [provider] Taking Active   nitroGLYCERIN (NITROSTAT) 0.4 MG SL tablet [id number] Yes Place 1 tablet (0.4 mg total) under the tongue every 5 (five) minutes x 3 doses as needed for chest pain. [name], PA-C Taking Active Spouse/Significant Other, Pharmacy Records           Med Note [facility], Karlene Lineman Mar 19, 2022  7:05 AM)                 Assessment/Plan:   Neck Pain: improving - Changed acetaminophen to 500mg  - take 1 capsules twice  a day for pain - Reminded her to do exercises that physical therapist provided for neck and shoulder. She should do these exercises 3 times a day.   Depression/Anxiety: Currently controlled - Recommend to continue to hold  hydroxyzine due to side effects / could cause AMS again - continue to take escitalopram   Medication Management:Current adherence strategy sufficient  - Continue to use weekly pill box to organize medications. Assisted patient in filling weekly medication containers.   - Provided a med list at previous visit with picture that she and her husband can use and reviewed.  - Reminded patient to inhaler 1 dose of Trelegy daily.     Follow Up Plan: 1 week  Henrene Pastor, PharmD Clinical Pharmacist [facility]

## 2022-08-11 ENCOUNTER — Telehealth: Payer: Self-pay

## 2022-08-11 NOTE — Telephone Encounter (Signed)
Physician orders (order number [id number]) signed and faxed back to [facility] at [phone]. Form sent for scanning.

## 2022-08-12 ENCOUNTER — Telehealth: Payer: Self-pay | Admitting: Pharmacist

## 2022-08-12 ENCOUNTER — Ambulatory Visit: Payer: PPO | Admitting: Internal Medicine

## 2022-08-12 DIAGNOSIS — G243 Spasmodic torticollis: Secondary | ICD-10-CM | POA: Diagnosis not present

## 2022-08-12 DIAGNOSIS — G20C Parkinsonism, unspecified: Secondary | ICD-10-CM | POA: Diagnosis not present

## 2022-08-12 NOTE — Telephone Encounter (Signed)
Patient called to request medication visit. She and her husband are back at home. He had hip surgery and Mandy Durham was staying with her brother and sister in Social worker.  It looks like she was suppose to see Dr Drue Novel today. When I mentioned to patient she reports she had completely forgot.  Will forward to scheduler to reach out to reschedule.   Appt made to see patient Friday 05/31 at 11am for in home visit to review meds and assist with med box refill.

## 2022-08-14 ENCOUNTER — Other Ambulatory Visit: Payer: PPO | Admitting: Pharmacist

## 2022-08-14 NOTE — Telephone Encounter (Signed)
Appointment made for July 1st
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
## 2022-08-18 ENCOUNTER — Ambulatory Visit (INDEPENDENT_AMBULATORY_CARE_PROVIDER_SITE_OTHER): Payer: PPO | Admitting: Podiatry

## 2022-08-18 ENCOUNTER — Encounter: Payer: Self-pay | Admitting: Podiatry

## 2022-08-18 DIAGNOSIS — B351 Tinea unguium: Secondary | ICD-10-CM | POA: Diagnosis not present

## 2022-08-18 DIAGNOSIS — M79674 Pain in right toe(s): Secondary | ICD-10-CM | POA: Diagnosis not present

## 2022-08-18 DIAGNOSIS — G20C Parkinsonism, unspecified: Secondary | ICD-10-CM | POA: Diagnosis not present

## 2022-08-18 DIAGNOSIS — R7303 Prediabetes: Secondary | ICD-10-CM | POA: Diagnosis not present

## 2022-08-18 DIAGNOSIS — M79675 Pain in left toe(s): Secondary | ICD-10-CM | POA: Diagnosis not present

## 2022-08-18 NOTE — Progress Notes (Signed)
This patient returns to my office for at risk foot care.  This patient requires this care by a professional since this patient will be at risk due to having neuropathy. and Parkinson.  This patient is unable to cut nails herself since the patient cannot reach her nails.These nails are painful walking and wearing shoes.  This patient presents for at risk foot care today.  General Appearance  Alert, conversant and in no acute stress.  Vascular  Dorsalis pedis and posterior tibial  pulses are  weakly palpable  bilaterally.  Capillary return is within normal limits  bilaterally. Temperature is within normal limits  bilaterally.  Neurologic  Senn-Weinstein monofilament wire test within normal limits  bilaterally. Muscle power within normal limits bilaterally.  Nails Thick disfigured discolored nails with subungual debris  from hallux to fifth toes bilaterally. No evidence of bacterial infection or drainage bilaterally.  Orthopedic  No limitations of motion  feet .  No crepitus or effusions noted.  No bony pathology or digital deformities noted.    Skin  normotropic skin with no porokeratosis noted bilaterally.  No signs of infections or ulcers noted.     Onychomycosis  Pain in right toes  Pain in left toes    Consent was obtained for treatment procedures.   Mechanical debridement of nails 1-5  bilaterally performed with a nail nipper.  Filed with dremel without incident.    Return office visit    12 weeks                  Told patient to return for periodic foot care and evaluation due to potential at risk complications.   Helane Gunther DPM

## 2022-08-20 ENCOUNTER — Encounter: Payer: Self-pay | Admitting: Pharmacist

## 2022-08-20 ENCOUNTER — Other Ambulatory Visit: Payer: PPO | Admitting: Pharmacist

## 2022-08-20 NOTE — Progress Notes (Signed)
08/20/2022 Name: Mandy Durham MRN: [id number] DOB: [birth year]  Chief Complaint  Patient presents with   Medication Management    Mandy Durham is a 77 y.o. year old female who was referred for medication management by their primary care provider, Mandy Plump, MD. They presented for a face to face  / home visit today.   [name] was referred to the pharmacist by care management and PCP for medication management.   Subjective:  Care Team: Primary Care Provider: Wanda Plump, MD ; Next Scheduled Visit: 08/12/2022 - missed appt; rescheduled for 09/14/2022 Neurologist: Mandy Spencer, NP; Next Scheduled Visit: no visit currently scheduled  - she missed appt 07/06/2022 due to husband fell / hip injury   Medication Access/Adherence  Current Pharmacy:  [facility] [address] ([address]), [place] - [address] [place] ([place]) [place] [address] Phone: [phone] Fax: [phone]   Patient reports affordability concerns with their medications: No  Patient reports access/transportation concerns to their pharmacy: Yes  Patient reports adherence concerns with their medications:  No  - Patient has used the weekly pill container that we filled for her last week.  Noted that 3 doses of carbidopa/levodopa were misses out for 28 for the week.   She states that the alarms on her cell phone have been helpful  Barriers to medication adherence:  Multiple comorbidities Low health literacy Poor knowledge of the illness and medication. Difficult to administer medication.   Depression/Anxiety  :  Current medications: escitalopram 10mg  daily   Other medications tried: Was prescribed hydroxyzine 10mg  twice a day at [place] but patient states she has only taken once at home because it made her feel loopy.  Took lorazepam for anxiety prior to recent hospitalization for altered mental status but this was discontinued at discharge.     Objective:  Lab Results   Component Value Date   HGBA1C 6.6 (H) 01/28/2022    Lab Results  Component Value Date   CREATININE 0.73 06/23/2022   BUN 28 (H) 06/23/2022   NA 137 06/23/2022   K 4.0 06/23/2022   CL 103 06/23/2022   CO2 21 (L) 06/23/2022    Lab Results  Component Value Date   CHOL 200 05/26/2021   HDL 108.70 05/26/2021   LDLCALC 76 05/26/2021   LDLDIRECT 106.9 11/03/2012   TRIG 75.0 05/26/2021   CHOLHDL 2 05/26/2021    Medications Reviewed Today     Reviewed by Mandy Durham, DPM (Physician) on 08/18/22 at 1548  Med List Status: <None>   Medication Order Taking? Sig Documenting Provider Last Dose Status Informant  ACETAMINOPHEN PO [id number] No Take 500 mg by mouth every 6 (six) hours as needed for moderate pain. Patient taking twice a day [provider] Taking Active Spouse/Significant Other, Pharmacy Records           Med Note [facility], Mandy Durham Mar 19, 2022  7:05 AM)    aspirin EC 81 MG tablet [id number] No Take 81 mg by mouth daily. Swallow whole. [provider] Taking Active Spouse/Significant Other, Pharmacy Records  carbidopa-levodopa (SINEMET IR) 25-100 MG tablet [id number] No Take 1 tablet by mouth 4 times daily at 8am/noon/4pm/8pm Tat, Mandy Batty, DO Taking Active Spouse/Significant Other, Pharmacy Records           Med Note Mandy Durham, [place] B   Fri Aug 14, 2022 12:10 PM) Taking at 9am/1pm/5pm/9pm  Cholecalciferol (VITAMIN D PO) [id number] No Take 1 tablet by  mouth 2 (two) times a week. Vitamin D  Patient not taking: Reported on 07/17/2022   [provider] Not Taking Active Spouse/Significant Other, Pharmacy Records  escitalopram (LEXAPRO) 10 MG tablet [id number] No Take 1 tablet (10 mg total) by mouth daily. Mandy Plump, MD Taking Active Spouse/Significant Other, Pharmacy Records  ezetimibe (ZETIA) 10 MG tablet [id number] No Take 1 tablet (10 mg total) by mouth daily. Mandy Plump, MD Taking Active Spouse/Significant Other, Pharmacy Records   Fluticasone-Umeclidin-Vilant [facility] ELLIPTA) 100-62.5-25 MCG/ACT AEPB [id number] No Inhale 1 puff into the lungs daily. Mandy Plump, MD Taking Active            Med Note Mandy Durham, [place] B   Fri Aug 14, 2022 12:12 PM) Only taking about 1 or 2 times per week  hydrOXYzine (ATARAX) 10 MG tablet [id number] No Take 10 mg by mouth 2 (two) times daily.  Patient not taking: Reported on 08/14/2022   [provider] Not Taking Active   lactose free nutrition (BOOST PLUS) LIQD [id number] No Take 237 mLs by mouth 2 (two) times daily between meals. Mandy Art, DO Taking Active Spouse/Significant Other, Pharmacy Records  Menthol, Topical Analgesic, (BIOFREEZE ROLL-ON) 4 % GEL [id number] No Apply topically as needed (for neck / shoulder pain). Use 3 or 4 times per day [provider] Taking Active   nitroGLYCERIN (NITROSTAT) 0.4 MG SL tablet [id number] No Place 1 tablet (0.4 mg total) under the tongue every 5 (five) minutes x 3 doses as needed for chest pain. Mandy Pigeon, PA-C Taking Active Spouse/Significant Other, Pharmacy Records           Med Note [facility], Mandy Durham Mar 19, 2022  7:05 AM)                 Assessment/Plan:   Neck Pain: improved a little - Continue acetaminophen to 500mg  - take 1 capsules twice  a day for pain (noon and at bedtime) - Reminded her to do exercises that physical therapist provided for neck and shoulder. She should do these exercises 3 times a day.   Depression/Anxiety: A little more social interaction over the last 2 weeks has been better for patient but I worry that she is isolated at home. - continue to take escitalopram 10mg  daily - she will see PCP 09/14/2022 could consider increasing to 20mg  daily if needed  - encouraged patient to interact with neighbors and family.   Assisted patient in calling neurology to rescheduled missed appt from 07/06/2022. Appointment scheduled for 10/20/2022 at 2pm.    Medication Management:Current  adherence strategy has improved adherence  - Continue to use weekly pill box to organize medications. Assisted patient in filling weekly medication containers. Filled enough for 2 weeks today.   - Continue to use phone alarm to remind when to take medications (9am / 1pm / 5pm / 9pm)    Follow Up Plan: 2 weeks  Henrene Pastor, PharmD Clinical Pharmacist [facility] [facility]

## 2022-08-21 ENCOUNTER — Telehealth: Payer: Self-pay

## 2022-08-21 NOTE — Telephone Encounter (Signed)
Physician orders (order number [id number], [phone]) signed and faxed to [facility] at [phone]. Forms sent for scanning.

## 2022-09-02 ENCOUNTER — Ambulatory Visit (INDEPENDENT_AMBULATORY_CARE_PROVIDER_SITE_OTHER): Payer: PPO | Admitting: Pharmacist

## 2022-09-02 VITALS — BP 112/64 | HR 88

## 2022-09-02 DIAGNOSIS — F419 Anxiety disorder, unspecified: Secondary | ICD-10-CM

## 2022-09-02 DIAGNOSIS — Z91199 Patient's noncompliance with other medical treatment and regimen due to unspecified reason: Secondary | ICD-10-CM

## 2022-09-02 DIAGNOSIS — G249 Dystonia, unspecified: Secondary | ICD-10-CM

## 2022-09-02 NOTE — Progress Notes (Unsigned)
09/02/2022 Name: Mandy Durham MRN: [id number] DOB: 1946/01/09  Chief Complaint  Patient presents with   Medication Management    Follow up     Mandy Durham is a 77 y.o. year old female who was referred for medication management by their primary care provider, Mandy Plump, MD. They presented for a face to face  / home visit today.   Mandy Durham was referred to the pharmacist by care management and PCP for medication management.   Subjective:  Care Team: Primary Care Provider: Wanda Plump, MD ; Next Scheduled Visit: 08/12/2022 - missed appt; rescheduled for 09/14/2022 Neurologist: Junie Spencer, NP; Next Scheduled Visit: 10/2022     Medication Access/Adherence  Current Pharmacy:  [facility] [address] ([address]), [place] - [address] [place] ([place]) [place] [address] Phone: [phone] Fax: [phone]   Patient reports affordability concerns with their medications: No  Patient reports access/transportation concerns to their pharmacy: Yes  Patient reports adherence concerns with their medications:  No  - Patient has used the weekly pill container that we filled for her last week.  Noted that 6 doses of carbidopa/levodopa were misses out for 56 over the last 2 weeks. Mostly missing nighttime dose. Per patient she sometimes fall asleep before she takes last carbidopa/levodopa dose.   She states that the alarms on her cell phone have been helpful  Barriers to medication adherence:  Multiple comorbidities Low health literacy Poor knowledge of the illness and medication. Difficult to administer medication.   Depression/Anxiety  :  Current medications: escitalopram 10mg  daily   Other medications tried: Was prescribed hydroxyzine 10mg  twice a day at [place] but patient states she has only taken once at home because it made her feel loopy.  Took lorazepam for anxiety prior to recent hospitalization for altered mental status but this was  discontinued at discharge.     Objective:  Lab Results  Component Value Date   HGBA1C 6.6 (H) 01/28/2022    Lab Results  Component Value Date   CREATININE 0.73 06/23/2022   BUN 28 (H) 06/23/2022   NA 137 06/23/2022   K 4.0 06/23/2022   CL 103 06/23/2022   CO2 21 (L) 06/23/2022    Lab Results  Component Value Date   CHOL 200 05/26/2021   HDL 108.70 05/26/2021   LDLCALC 76 05/26/2021   LDLDIRECT 106.9 11/03/2012   TRIG 75.0 05/26/2021   CHOLHDL 2 05/26/2021    Medications Reviewed Today     Reviewed by Helane Gunther, DPM (Physician) on 08/18/22 at 1548  Med List Status: <None>   Medication Order Taking? Sig Documenting Provider Last Dose Status Informant  ACETAMINOPHEN PO [id number] No Take 500 mg by mouth every 6 (six) hours as needed for moderate pain. Patient taking twice a day [provider] Taking Active Spouse/Significant Other, Pharmacy Records           Med Note [facility], Karlene Lineman Mar 19, 2022  7:05 AM)    aspirin EC 81 MG tablet [id number] No Take 81 mg by mouth daily. Swallow whole. [provider] Taking Active Spouse/Significant Other, Pharmacy Records  carbidopa-levodopa (SINEMET IR) 25-100 MG tablet [id number] No Take 1 tablet by mouth 4 times daily at 8am/noon/4pm/8pm Tat, Octaviano Batty, DO Taking Active Spouse/Significant Other, Pharmacy Records           Med Note Clydie Braun, [place] B   Fri Aug 14, 2022 12:10 PM) Taking at 9am/1pm/5pm/9pm  Cholecalciferol (VITAMIN D PO) [id number] No Take 1 tablet by mouth 2 (two) times a week. Vitamin D  Patient not taking: Reported on 07/17/2022   [provider] Not Taking Active Spouse/Significant Other, Pharmacy Records  escitalopram (LEXAPRO) 10 MG tablet [id number] No Take 1 tablet (10 mg total) by mouth daily. Mandy Plump, MD Taking Active Spouse/Significant Other, Pharmacy Records  ezetimibe (ZETIA) 10 MG tablet [id number] No Take 1 tablet (10 mg total) by mouth daily. Mandy Plump, MD Taking Active  Spouse/Significant Other, Pharmacy Records  Fluticasone-Umeclidin-Vilant [facility] ELLIPTA) 100-62.5-25 MCG/ACT AEPB [id number] No Inhale 1 puff into the lungs daily. Mandy Plump, MD Taking Active            Med Note Clydie Braun, [place] B   Fri Aug 14, 2022 12:12 PM) Only taking about 1 or 2 times per week  hydrOXYzine (ATARAX) 10 MG tablet [id number] No Take 10 mg by mouth 2 (two) times daily.  Patient not taking: Reported on 08/14/2022   [provider] Not Taking Active   lactose free nutrition (BOOST PLUS) LIQD [id number] No Take 237 mLs by mouth 2 (two) times daily between meals. Joseph Art, DO Taking Active Spouse/Significant Other, Pharmacy Records  Menthol, Topical Analgesic, (BIOFREEZE ROLL-ON) 4 % GEL [id number] No Apply topically as needed (for neck / shoulder pain). Use 3 or 4 times per day [provider] Taking Active   nitroGLYCERIN (NITROSTAT) 0.4 MG SL tablet [id number] No Place 1 tablet (0.4 mg total) under the tongue every 5 (five) minutes x 3 doses as needed for chest pain. [name], PA-C Taking Active Spouse/Significant Other, Pharmacy Records           Med Note [facility], Karlene Lineman Mar 19, 2022  7:05 AM)                 Assessment/Plan:   Neck Pain: improved a little - Continue acetaminophen to 500mg  - take 1 capsules twice  a day for pain (noon and at bedtime) - Reminded her to do exercises that physical therapist provided for neck and shoulder. She should do these exercises 3 times a day.   Depression/Anxiety: A little more social interaction over the last 2 weeks has been better for patient but I worry that she is isolated at home. - continue to take escitalopram 10mg  daily - she will see PCP 09/14/2022 could consider increasing to 20mg  daily if needed  - encouraged patient to interact with neighbors and family.   Assisted patient in calling neurology to rescheduled missed appt from 07/06/2022. Appointment scheduled for 10/20/2022 at 2pm.     Medication Management:Current adherence strategy has improved adherence  - Continue to use weekly pill box to organize medications. Assisted patient in filling weekly medication containers. Filled enough for 2 weeks today.   - Continue to use phone alarm to remind when to take medications (9am / 1pm / 5pm / 9pm)    Follow Up Plan: 2 weeks  Henrene Pastor, PharmD Clinical Pharmacist [place] [facility]

## 2022-09-10 ENCOUNTER — Other Ambulatory Visit: Payer: PPO | Admitting: Pharmacist

## 2022-09-10 ENCOUNTER — Telehealth: Payer: Self-pay | Admitting: Pharmacist

## 2022-09-10 NOTE — Telephone Encounter (Signed)
Unable to reach patient for follow up appointment for medication management.  LM on VM with CB# [phone]

## 2022-09-12 DIAGNOSIS — G20C Parkinsonism, unspecified: Secondary | ICD-10-CM | POA: Diagnosis not present

## 2022-09-12 DIAGNOSIS — G243 Spasmodic torticollis: Secondary | ICD-10-CM | POA: Diagnosis not present

## 2022-09-14 ENCOUNTER — Ambulatory Visit: Payer: PPO | Admitting: Internal Medicine

## 2022-09-16 ENCOUNTER — Telehealth: Payer: Self-pay | Admitting: Neurology

## 2022-09-16 ENCOUNTER — Ambulatory Visit (INDEPENDENT_AMBULATORY_CARE_PROVIDER_SITE_OTHER): Payer: PPO | Admitting: Pharmacist

## 2022-09-16 DIAGNOSIS — Z79899 Other long term (current) drug therapy: Secondary | ICD-10-CM

## 2022-09-16 DIAGNOSIS — F419 Anxiety disorder, unspecified: Secondary | ICD-10-CM

## 2022-09-16 DIAGNOSIS — G249 Dystonia, unspecified: Secondary | ICD-10-CM | POA: Diagnosis not present

## 2022-09-16 MED ORDER — CARBIDOPA-LEVODOPA 25-100 MG PO TABS: ORAL_TABLET | ORAL | 0 refills | Status: DC

## 2022-09-16 NOTE — Telephone Encounter (Signed)
Spoke with patient today. She states she is not confident that she will be able to fill her med boxes on her own.  She also states that [facility] has not been able to refill her carbidopa / levodopa. I called her neurology office to ask for refill. Message was sent to her neuro team for refill.

## 2022-09-16 NOTE — Telephone Encounter (Signed)
Mandy Durham([place] clinical pharmacist) is asking pt' s carbidopa-levodopa (SINEMET IR) 25-100 MG tablet be called into  [facility] [phone]

## 2022-09-16 NOTE — Addendum Note (Signed)
Addended by: Berna Spare A on: 09/16/2022 12:00 PM   Modules accepted: Orders

## 2022-09-16 NOTE — Progress Notes (Signed)
09/16/2022 Name: Mandy Durham MRN: [id number] DOB: [birth year]  Chief Complaint  Patient presents with   Medication Management    Mandy Durham is a 77 y.o. year old female who was referred for medication management by their primary care provider, Mandy Plump, MD. They presented for a face to face office visit today.   Mandy Durham was referred to the pharmacist by care management and PCP for medication management.   Subjective:  Care Team: Primary Care Provider: Wanda Plump, MD ; Next Scheduled Visit: 08/12/2022 - missed appt; rescheduled for 09/21/2022 Neurologist: Mandy Spencer, NP; Next Scheduled Visit: 10/2022     Medication Access/Adherence  Current Pharmacy:  [facility] [address] ([address]), [place] - [address] [place] ([place]) [place] [address] Phone: [phone] Fax: [phone]   Patient reports affordability concerns with their medications: No  Patient reports access/transportation concerns to their pharmacy: Yes  Patient reports adherence concerns with their medications:  Yes  -  patient only has 2 tablets of carbidopa / levodopa and [facility] states they have not been able to get refill. I had assisted patient in calling [facility] 2 weeks ago and had asked that they not call prescriber on last Rx bottle because that was someone that saw her in rehab. Provided neurology office number and requested that they send refill to [facility].   Patient also was planning to try to fill weekly med containers on her own but today she voices that she would like assistance.   Barriers to medication adherence:  Multiple comorbidities Low health literacy Poor knowledge of the illness and medication. Difficult to administer medication.  She is using alarm on her phone to help her remember to take medications.   Depression/Anxiety  :  Current medications: escitalopram 10mg  daily   Other medications tried: Was prescribed hydroxyzine 10mg  twice a day  at [facility] but patient states she has only taken once at home because it made her feel loopy.  Took lorazepam for anxiety prior to recent hospitalization for altered mental status but this was discontinued at discharge.     Objective:  Lab Results  Component Value Date   HGBA1C 6.6 (H) 01/28/2022    Lab Results  Component Value Date   CREATININE 0.73 06/23/2022   BUN 28 (H) 06/23/2022   NA 137 06/23/2022   K 4.0 06/23/2022   CL 103 06/23/2022   CO2 21 (L) 06/23/2022    Lab Results  Component Value Date   CHOL 200 05/26/2021   HDL 108.70 05/26/2021   LDLCALC 76 05/26/2021   LDLDIRECT 106.9 11/03/2012   TRIG 75.0 05/26/2021   CHOLHDL 2 05/26/2021    Medications Reviewed Today     Reviewed by Mandy Durham, RPH-CPP (Pharmacist) on 09/16/22 at 1147  Med List Status: <None>   Medication Order Taking? Sig Documenting Provider Last Dose Status Informant  ACETAMINOPHEN PO [id number]  Take 500 mg by mouth every 6 (six) hours as needed for moderate pain. Patient taking twice a day [provider]  Active Spouse/Significant Other, Pharmacy Records           Med Note [facility], Mandy Durham Mar 19, 2022  7:05 AM)    aspirin EC 81 MG tablet [id number]  Take 81 mg by mouth daily. Swallow whole. [provider]  Active Spouse/Significant Other, Pharmacy Records  carbidopa-levodopa (SINEMET IR) 25-100 MG tablet [id number]  Take 1 tablet by mouth 4 times daily at 8am/noon/4pm/8pm  Mandy Faster, DO  Active Spouse/Significant Other, Pharmacy Records           Med Note Mandy Durham, [place] B   Fri Aug 14, 2022 12:10 PM) Taking at 9am/1pm/5pm/9pm  Cholecalciferol (VITAMIN D PO) [id number]  Take 1 tablet by mouth 2 (two) times a week. Vitamin D  Patient not taking: Reported on 07/17/2022   [provider]  Active Spouse/Significant Other, Pharmacy Records  escitalopram (LEXAPRO) 10 MG tablet [id number]  Take 1 tablet (10 mg total) by mouth daily. Mandy Plump, MD  Active  Spouse/Significant Other, Pharmacy Records  ezetimibe (ZETIA) 10 MG tablet [id number]  Take 1 tablet (10 mg total) by mouth daily. Mandy Plump, MD  Active Spouse/Significant Other, Pharmacy Records  Fluticasone-Umeclidin-Vilant [facility] ELLIPTA) 100-62.5-25 MCG/ACT AEPB [id number]  Inhale 1 puff into the lungs daily. Mandy Plump, MD  Active            Med Note Mandy Durham, [place] B   Fri Aug 14, 2022 12:12 PM) Only taking about 1 or 2 times per week  hydrOXYzine (ATARAX) 10 MG tablet [id number]  Take 10 mg by mouth 2 (two) times daily.  Patient not taking: Reported on 08/14/2022   [provider]  Active   lactose free nutrition (BOOST PLUS) LIQD [id number]  Take 237 mLs by mouth 2 (two) times daily between meals.  Patient not taking: Reported on 09/03/2022   Mandy Art, DO  Active Spouse/Significant Other, Pharmacy Records  Menthol, Topical Analgesic, (BIOFREEZE ROLL-ON) 4 % GEL [id number]  Apply topically as needed (for neck / shoulder pain). Use 3 or 4 times per day  Patient not taking: Reported on 09/03/2022   [provider]  Active   nitroGLYCERIN (NITROSTAT) 0.4 MG SL tablet [id number]  Place 1 tablet (0.4 mg total) under the tongue every 5 (five) minutes x 3 doses as needed for chest pain.  Patient not taking: Reported on 09/03/2022   Mandy Pigeon, PA-C  Active Spouse/Significant Other, Pharmacy Records           Med Note [facility], Mandy Durham Mar 19, 2022  7:05 AM)                 Assessment/Plan:   Depression/Anxiety: A little more social interaction recently. has been better for patient  - continue to take escitalopram 10mg  daily - she will see PCP 09/14/2022 could consider increasing to 20mg  daily if needed  - encouraged patient to interact with neighbors and family.    Medication Management:Current adherence strategy has improved adherence  - Called neurology office to request refill for carbidopa / levodopa - Mandy Durham sent in Rx but not until after  patient was in office.  - Continue to use weekly pill box to organize medications. Assisted patient in filling weekly medication containers through next week but we were not able to put in carbidopa /levodopa because she was out. I wrote out instructions on how to fill carbidopa / levodopa - patient will place 1 tablet in each of the 4 time slots for each day Thursday thru Tuesday for next week. She voiced understanding. She will be in office again 09/21/2022 and I will fill for 2 weeks. Filled enough for 2 weeks today.  Patient will try filling on her own. Will check back to see if she needs any assistance.  - Continue to use phone alarm to remind when to take medications (9am / 1pm / 5pm / 9pm)  Follow Up Plan: 1 week  Mandy Durham, PharmD Clinical Pharmacist [facility]

## 2022-09-16 NOTE — Telephone Encounter (Signed)
Called pt to let her know her Rx for Carbidopa Levodopa 25-100 mg has been sent to her pharmacy.

## 2022-09-21 ENCOUNTER — Encounter: Payer: Self-pay | Admitting: Internal Medicine

## 2022-09-21 ENCOUNTER — Ambulatory Visit (INDEPENDENT_AMBULATORY_CARE_PROVIDER_SITE_OTHER): Payer: PPO | Admitting: Internal Medicine

## 2022-09-21 VITALS — BP 138/64 | HR 98 | Temp 98.0°F | Resp 16 | Ht 65.0 in | Wt 112.1 lb

## 2022-09-21 DIAGNOSIS — R739 Hyperglycemia, unspecified: Secondary | ICD-10-CM

## 2022-09-21 DIAGNOSIS — I10 Essential (primary) hypertension: Secondary | ICD-10-CM | POA: Diagnosis not present

## 2022-09-21 DIAGNOSIS — G20C Parkinsonism, unspecified: Secondary | ICD-10-CM

## 2022-09-21 DIAGNOSIS — E119 Type 2 diabetes mellitus without complications: Secondary | ICD-10-CM | POA: Insufficient documentation

## 2022-09-21 NOTE — Assessment & Plan Note (Signed)
DM: Check A1c, currently diet controlled HTN: h/o on no meds, BP okay. Hyperlipidemia: Statin intolerant, continue Zetia Osteopenia: At some point was taking medication, will not attempt to add another medicine to her regimen at this point. Parkinson's: Explained this is a chronic condition, it can be controlled but will be difficult to get much better.  Listening therapy provided.  Fortunately no recent fall.  She has an appointment scheduled w/ neurology. Social: Lives with her husband who do most of the driving.  States that her daughter is not involved in her care. Compliance: See LOV,  apparently improved RTC 3 months

## 2022-09-21 NOTE — Patient Instructions (Addendum)
Proceed with blood work today  Come back in 3 months  You have an appointment to see neurology on 10/20/2022    Vaccines I recommend: Covid booster Tdap (tetanus) Shingrix (shingles) RSV vaccine Flu shot this fall

## 2022-09-21 NOTE — Progress Notes (Signed)
Subjective:    Patient ID: Mandy Durham, female    DOB: 1946/01/31, 77 y.o.   MRN: [id number]  DOS:  09/21/2022 Type of visit - description: Follow-up  Since the last office visit things are about the same. Reports she has access to food. No recent falls. She is concerned about Parkinson's "not getting better"  Review of Systems See above   Past Medical History:  Diagnosis Date   Anxiety state, unspecified    Hyperlipidemia    Hypertension    Insomnia    LBBB (left bundle branch block)    LHC in 2002 showed normal coronaries.    Osteoarthrosis, unspecified whether generalized or localized, unspecified site    Overweight(278.02)    Parkinson disease    S/P placement of cardiac pacemaker    a. 02/21/16: Medtronic Advisa DR MRI SureScan model W0JW11 (serial number [id number])    Serrated adenoma of colon 2007   Symptomatic menopausal or female climacteric states    Syncope and collapse    11/12: Holter 12/12 with rare PACS, HR range 64-140, average 82, no significant arrhythmias. Echo (1/13): EF 50-55%, mild LVH, septal-lateral dyssynchrony c/w LBBB. 3-week event monitor (1/13): No significant arrhythmia.    Ventricular tachycardia (HCC) 05/24/2022   Patient with 3:1 AVB with pacemaker. She is noted to be tachycardic on telemetry but is asymptomatic  Plan    Past Surgical History:  Procedure Laterality Date   COLONOSCOPY  2007   EP IMPLANTABLE DEVICE N/A 02/21/2016   Procedure: Pacemaker Implant;  Surgeon: Will Jorja Loa, MD;  Location: [facility] INVASIVE CV LAB;  Service: Cardiovascular;  Laterality: N/A;   POLYPECTOMY      Current Outpatient Medications  Medication Instructions   ACETAMINOPHEN PO 500 mg, Oral, Every 6 hours PRN, Patient taking twice a day   aspirin EC 81 mg, Oral, Daily, Swallow whole.    carbidopa-levodopa (SINEMET IR) 25-100 MG tablet Take 1 tablet by mouth 4 times daily at 8am/noon/4pm/8pm   Cholecalciferol (VITAMIN D PO) 1 tablet, 2 times weekly    escitalopram (LEXAPRO) 10 mg, Oral, Daily   ezetimibe (ZETIA) 10 mg, Oral, Daily   Fluticasone-Umeclidin-Vilant (TRELEGY ELLIPTA) 100-62.5-25 MCG/ACT AEPB 1 puff, Inhalation, Daily   hydrOXYzine (ATARAX) 10 mg, 2 times daily   lactose free nutrition (BOOST PLUS) LIQD 237 mLs, Oral, 2 times daily between meals   Menthol, Topical Analgesic, (BIOFREEZE ROLL-ON) 4 % GEL As needed   nitroGLYCERIN (NITROSTAT) 0.4 mg, Sublingual, Every 5 min x3 PRN       Objective:   Physical Exam BP 138/64   Pulse 98   Temp 98 F (36.7 C) (Oral)   Resp 16   Ht 5\' 5"  (1.651 m)   Wt 112 lb 2 oz (50.9 kg)   SpO2 96%   BMI 18.66 kg/m  General:   Well developed, NAD, BMI noted. HEENT:  Normocephalic . Face symmetric, atraumatic Lungs:  CTA B Normal respiratory effort, no intercostal retractions, no accessory muscle use. Heart: RRR,  no murmur.  Lower extremities: no pretibial edema bilaterally  Skin: Not pale. Not jaundice Neurologic:  alert & oriented X3. Tremulous speech.  Dyskinetic movements noted. Speech normal, gait limited by parkinsonian but seems more steady  today.   Psych--  Cognition and judgment appear intact.  Cooperative with normal attention span and concentration.  Behavior appropriate. Anxious appearing but better than before.    Assessment     Assessment DM HTN : amlodipine: edema 10/2018 Hyperlipidemia, statin intolerant  Anxiety, insomnia   Menopausal; DEXA 2012 -1.5 @ gyn Osteopenia Neuro: --Atypical Parkinson: started w/ tremors LUE ~ 9 -2017 subsequently dx Parkinson  w/  hyperkinesis of the body --Torticollis to the left, Cervical dystonia Recurrent Syncope --3:1 AV Block pacemaker 02/21/16 --   DX with autonomic insufficiency ~ 02-2015 >>>on a  abd binder , pyridostigmine, rx midodrine 06-2015 per cards  --CT coronary morphology with CTA and coronary score 01/12/2018: No significant disease - Diastolic CHF Traumatic SAH (after a syncope),TBI--- admitted 01-2015   -- was rx seroquel (aparently for ICU delirium) -- d/c 07-2015 --rx topamax when at rehab for [facility]  HAs -- had issues w/ b/b incontinence, better as of 07-2015 DOE/CP: Low risk Myoview, echo done 10/20/2019, saw cards 05/2020 and pulmonary 07/2020: multifactorial, PFTx ordered    PLAN DM: Check A1c, currently diet controlled HTN: h/o on no meds, BP okay. Hyperlipidemia: Statin intolerant, continue Zetia Osteopenia: At some point was taking medication, will not attempt to add another medicine to her regimen at this point. Parkinson's: Explained this is a chronic condition, it can be controlled but will be difficult to get much better.  Listening therapy provided.  Fortunately no recent fall.  She has an appointment scheduled w/ neurology. Social: Lives with her husband who do most of the driving.  States that her daughter is not involved in her care. Compliance: See LOV,  apparently improved RTC 3 months

## 2022-10-12 DIAGNOSIS — G20C Parkinsonism, unspecified: Secondary | ICD-10-CM | POA: Diagnosis not present

## 2022-10-12 DIAGNOSIS — G243 Spasmodic torticollis: Secondary | ICD-10-CM | POA: Diagnosis not present

## 2022-10-19 ENCOUNTER — Telehealth: Payer: Self-pay | Admitting: Internal Medicine

## 2022-10-19 NOTE — Telephone Encounter (Signed)
Prescription Request  10/19/2022  Is this a "Controlled Substance" medicine? No  LOV: 09/21/2022  What is the name of the medication or equipment? acetaminophen (TYLENOL) tablet 500 mg     Have you contacted your pharmacy to request a refill? No   Which pharmacy would you like this sent to?  [facility] [address] ([address] [place] - [address] [place]) [place] [address] Phone: [phone] Fax: [phone]    Patient notified that their request is being sent to the clinical staff for review and that they should receive a response within 2 business days.   Please advise at [facility] [phone]

## 2022-10-20 ENCOUNTER — Encounter: Payer: Self-pay | Admitting: Adult Health

## 2022-10-20 ENCOUNTER — Ambulatory Visit: Payer: PPO | Admitting: Adult Health

## 2022-10-20 VITALS — BP 117/69 | HR 86 | Ht 65.0 in | Wt 113.0 lb

## 2022-10-20 DIAGNOSIS — G20C Parkinsonism, unspecified: Secondary | ICD-10-CM | POA: Diagnosis not present

## 2022-10-20 DIAGNOSIS — G243 Spasmodic torticollis: Secondary | ICD-10-CM

## 2022-10-20 MED ORDER — CARBIDOPA-LEVODOPA 25-100 MG PO TABS: ORAL_TABLET | ORAL | 12 refills | Status: DC

## 2022-10-20 NOTE — Progress Notes (Signed)
PATIENT: Mandy Durham DOB: May 27, 1945  REASON FOR VISIT: follow up HISTORY FROM: patient PRIMARY NEUROLOGIST: Dr. Vickey Huger  Chief Complaint  Patient presents with   Follow-up    Pt in 4 Pt here for Parkinson's f/u Pt has Dyskinesia .Pt speech is affected. Pt states admitted to hospital due to worsening  parkinson symptoms  and than to rehab      HISTORY OF PRESENT ILLNESS: Today 10/20/22:  Mandy Durham is a 77 y.o. female with a history of atypical Parkinson's disease and cervical dystonia. Returns today for follow-up.  At the last visit with Dr. Vickey Huger she was referred to Dr. Terrace Arabia for possible Botox injections due to dystonia.  The patient was called for an appointment but never set this up.  She remains on Sinemet 1 tablet 4 times a day.  Has significant dyskinesias at affects her quality of life.  Speech is severely affected.  She states that she is currently living at home with her husband.  But she feels that if her symptoms are getting worse she may have to consider a skilled nursing facility in the future.  She states that she does not want to take this route as she feels that this is "no life to live."    12/18/21 (Dohmeier):Ms. [name] is a 77 year old female with a history of tremor and dystonia- she has changed sicne my last visit with hr to 25/ 100 mg four times a day and she lost a lot of weight . She is moving all the time, with hesitant speech today and her neck is tilted to her left, chin down, pointing to the chest, and she looks up from this  head down position" - speech is clearly affected , stammering speech, no fluent-  and dysphonia , but she denies dysphagia. I think we are looking at dystonia.  Left neck paraspinal  muscles and trapezius is contracted  as is the platysm. Amantadine as ordered after a phone conversation 12-02-2021 but not collected at pharmacy. Her left arm is stiff,  flexed and the right arm and hand are much smoother in movement. She related  onset on symptoms after a fall 8 years ago, her tremor began and increased since- as she recalls today.  She reports not feeling much joy in life and is der pressed by her appearance, her limitations, Her memory is of concern to her.     10/13/21: [name] is a 77 year old female with a history of parkinson's disease. She returns today for follow-up. Saw Dr. Arbutus Leas in May and she made the recommendation to change to sinemet 25-100 QID and to stop requip. Reports that Dyskinesias has gotten worse. Trouble speaking, moving shoulders and lips. She was given baclofen 5 mg BID and found it beneficial but feels that the dosage needs to be higher. Using it for neck pain.  She reports right now she feels that she has no quality of life.  When she saw Dr. Arbutus Leas there was some concern about possible MSA due to the patient's symptoms of diplopia and low-dose dyskinesia.  At that time Sinemet was increased to 25-100 mg 4 times a day.  REVIEW OF SYSTEMS: Out of a complete 14 system review of symptoms, the patient complains only of the following symptoms, and all other reviewed systems are negative.  ALLERGIES: No Known Allergies  HOME MEDICATIONS: Outpatient Medications Prior to Visit  Medication Sig Dispense Refill   ACETAMINOPHEN PO Take 500 mg by mouth  every 6 (six) hours as needed for moderate pain. Patient taking twice a day     aspirin EC 81 MG tablet Take 81 mg by mouth daily. Swallow whole.     carbidopa-levodopa (SINEMET IR) 25-100 MG tablet Take 1 tablet by mouth 4 times daily at 8am/noon/4pm/8pm 360 tablet 0   Cholecalciferol (VITAMIN D PO) Take 1 tablet by mouth 2 (two) times a week. Vitamin D     escitalopram (LEXAPRO) 10 MG tablet Take 1 tablet (10 mg total) by mouth daily. 90 tablet 1   ezetimibe (ZETIA) 10 MG tablet Take 1 tablet (10 mg total) by mouth daily. 90 tablet 1   Fluticasone-Umeclidin-Vilant (TRELEGY ELLIPTA) 100-62.5-25 MCG/ACT AEPB Inhale 1 puff into the lungs daily. 14 each 0    hydrOXYzine (ATARAX) 10 MG tablet Take 10 mg by mouth 2 (two) times daily.     lactose free nutrition (BOOST PLUS) LIQD Take 237 mLs by mouth 2 (two) times daily between meals.  0   Menthol, Topical Analgesic, (BIOFREEZE ROLL-ON) 4 % GEL Apply topically as needed (for neck / shoulder pain). Use 3 or 4 times per day     nitroGLYCERIN (NITROSTAT) 0.4 MG SL tablet Place 1 tablet (0.4 mg total) under the tongue every 5 (five) minutes x 3 doses as needed for chest pain. 25 tablet 1   No facility-administered medications prior to visit.    PAST MEDICAL HISTORY: Past Medical History:  Diagnosis Date   Anxiety state, unspecified    Hyperlipidemia    Hypertension    Insomnia    LBBB (left bundle branch block)    LHC in 2002 showed normal coronaries.    Osteoarthrosis, unspecified whether generalized or localized, unspecified site    Overweight(278.02)    Parkinson disease    S/P placement of cardiac pacemaker    a. 02/21/16: Medtronic Advisa DR MRI SureScan model [id number] (serial number [id number])    Serrated adenoma of colon 2007   Symptomatic menopausal or female climacteric states    Syncope and collapse    11/12: Holter 12/12 with rare PACS, HR range 64-140, average 82, no significant arrhythmias. Echo (1/13): EF 50-55%, mild LVH, septal-lateral dyssynchrony c/w LBBB. 3-week event monitor (1/13): No significant arrhythmia.    Ventricular tachycardia (HCC) 05/24/2022   Patient with 3:1 AVB with pacemaker. She is noted to be tachycardic on telemetry but is asymptomatic  Plan    PAST SURGICAL HISTORY: Past Surgical History:  Procedure Laterality Date   COLONOSCOPY  2007   EP IMPLANTABLE DEVICE N/A 02/21/2016   Procedure: Pacemaker Implant;  Surgeon: Will Jorja Loa, MD;  Location: [facility];  Service: Cardiovascular;  Laterality: N/A;   POLYPECTOMY      FAMILY HISTORY: Family History  Problem Relation Age of Onset   Heart failure Mother    Coronary artery disease  Mother    Dementia Mother    Diabetes Mother    Colon cancer Neg Hx    Breast cancer Neg Hx    Hypertension Neg Hx    Heart disease Neg Hx    Heart attack Neg Hx    Stroke Neg Hx    Rectal cancer Neg Hx    Stomach cancer Neg Hx    Esophageal cancer Neg Hx    Liver disease Neg Hx    Parkinson's disease Neg Hx     SOCIAL HISTORY: Social History   Socioeconomic History   Marital status: Married    Spouse name: Link Snuffer  Number of children: 2   Years of education: 12   Highest education level: 12th grade  Occupational History   Occupation: retired Futures trader: [employer]  Tobacco Use   Smoking status: Never    Passive exposure: Never   Smokeless tobacco: Never  Vaping Use   Vaping status: Never Used  Substance and Sexual Activity   Alcohol use: No   Drug use: No   Sexual activity: Not Currently  Other Topics Concern   Not on file  Social History Narrative   HSG.  Married '65.     2 daughters,   '71 Neysa Bonito, lives in [place].   '77; Arline Asp, lives in [place]   4 grandchildren   Lives with husband   Right handed   Caffeine: half -1 cup a coffee a day         Social Determinants of Health   Financial Resource Strain: Low Risk  (04/30/2021)   Overall Financial Resource Strain (CARDIA)    Difficulty of Paying Living Expenses: Not hard at all  Food Insecurity: No Food Insecurity (05/25/2022)   Hunger Vital Sign    Worried About Running Out of Food in the Last Year: Never true    Ran Out of Food in the Last Year: Never true  Transportation Needs: Unmet Transportation Needs (07/17/2022)   PRAPARE - Transportation    Lack of Transportation (Medical): No    Lack of Transportation (Non-Medical): Yes  Physical Activity: Inactive (04/30/2021)   Exercise Vital Sign    Days of Exercise per Week: 0 days    Minutes of Exercise per Session: 0 min  Stress: Stress Concern Present (04/30/2021)   [employer] of  Occupational Health - Occupational Stress Questionnaire    Feeling of Stress : Rather much  Social Connections: Moderately Integrated (04/30/2021)   Social Connection and Isolation Panel [NHANES]    Frequency of Communication with Friends and Family: More than three times a week    Frequency of Social Gatherings with Friends and Family: Three times a week    Attends Religious Services: 1 to 4 times per year    Active Member of Clubs or Organizations: No    Attends Banker Meetings: Never    Marital Status: Married  Catering manager Violence: Not At Risk (05/25/2022)   Humiliation, Afraid, Rape, and Kick questionnaire    Fear of Current or Ex-Partner: No    Emotionally Abused: No    Physically Abused: No    Sexually Abused: No      PHYSICAL EXAM  Vitals:   10/20/22 1343  BP: 117/69  Pulse: 86  Weight: 113 lb (51.3 kg)  Height: 5\' 5"  (1.651 m)   Body mass index is 18.8 kg/m.  Generalized: Well developed, in no acute distress   Neurological examination  Mentation: Alert oriented to time, place, history taking. Follows all commands.speech is haltered with stammering,  Cranial nerve II-XII: Pupils were equal round reactive to light. Extraocular movements were full, visual field were full on confrontational test. Facial sensation and strength were normal. Uvula tongue midline. Trouble with neckextension.  Motor: The motor testing reveals 5 over 5 strength of all 4 extremities.  Increased tone in the left upper extremity. Dyskinesias. Sensory: Sensory testing is intact to soft touch on all 4 extremities. No evidence of extinction is noted.  Coordination: Cerebellar testing reveals good finger-nose-finger and heel-to-shin bilaterally.  Gait and station: Uses the chair to push to stand up.  Good stride with decreased arm swing. Stooped posture.    DIAGNOSTIC DATA (LABS, IMAGING, TESTING) - I reviewed patient records, labs, notes, testing and imaging myself where  available.  Lab Results  Component Value Date   WBC 6.8 06/23/2022   HGB 12.2 06/23/2022   HCT 36.9 06/23/2022   MCV 88.5 06/23/2022   PLT 318 06/23/2022      Component Value Date/Time   NA 137 06/23/2022 1600   NA 138 08/26/2021 0000   K 4.0 06/23/2022 1600   CL 103 06/23/2022 1600   CO2 21 (L) 06/23/2022 1600   GLUCOSE 74 06/23/2022 1600   BUN 28 (H) 06/23/2022 1600   BUN 19 08/26/2021 0000   CREATININE 0.73 06/23/2022 1600   CREATININE 0.85 06/07/2020 1030   CALCIUM 9.0 06/23/2022 1600   PROT 6.0 (L) 05/29/2022 0741   PROT 6.3 06/09/2021 1450   ALBUMIN 3.2 (L) 05/29/2022 0741   ALBUMIN 4.2 06/09/2021 1450   AST 20 05/29/2022 0741   ALT 10 05/29/2022 0741   ALKPHOS 71 05/29/2022 0741   BILITOT 0.7 05/29/2022 0741   BILITOT 0.3 06/09/2021 1450   GFRNONAA >60 06/23/2022 1600   GFRAA 93 12/04/2019 1540   Lab Results  Component Value Date   CHOL 200 05/26/2021   HDL 108.70 05/26/2021   LDLCALC 76 05/26/2021   LDLDIRECT 106.9 11/03/2012   TRIG 75.0 05/26/2021   CHOLHDL 2 05/26/2021   Lab Results  Component Value Date   HGBA1C 6.6 (H) 01/28/2022   Lab Results  Component Value Date   VITAMINB12 290 06/09/2021   Lab Results  Component Value Date   TSH 1.397 03/19/2022      ASSESSMENT AND PLAN 77 y.o. year old female  has a past medical history of Anxiety state, unspecified, Hyperlipidemia, Hypertension, Insomnia, LBBB (left bundle branch block), Osteoarthrosis, unspecified whether generalized or localized, unspecified site, Overweight(278.02), Parkinson disease, S/P placement of cardiac pacemaker, Serrated adenoma of colon (2007), Symptomatic menopausal or female climacteric states, Syncope and collapse, and Ventricular tachycardia (HCC) (05/24/2022). here with:  1.  Parkinson's disease  -Reduce Sinemet 25-100 mg to 1 tablet in the morning half a tablet at noon and a half a tablet in the afternoon and 1 tablet at bedtime  2.  Cervical dystonia  -I will  refer to Dr. Terrace Arabia for possible injections  Follow-up in 6months with Dr. Vickey Huger  Dr. Dohmeier came into the visit and examined the patient as well.   Butch Penny, MSN, NP-C 10/20/2022, 1:59 PM [facility] [address], [address] [place], [place] [address] [phone]

## 2022-10-20 NOTE — Patient Instructions (Signed)
Decrease Sinemet 25-100 mg: 1 tablet in the AM, 1/2 tablet at noon, 1/2 tablet in the afternoon and 1 tablet at bedtime Will ask Dr. Terrace Arabia to see you for injections in your neck If your symptoms worsen or you develop new symptoms please let us know.

## 2022-10-23 ENCOUNTER — Ambulatory Visit: Payer: PPO

## 2022-10-23 DIAGNOSIS — I441 Atrioventricular block, second degree: Secondary | ICD-10-CM

## 2022-10-29 ENCOUNTER — Ambulatory Visit: Payer: PPO | Admitting: Neurology

## 2022-10-29 ENCOUNTER — Encounter: Payer: Self-pay | Admitting: Neurology

## 2022-10-29 VITALS — BP 122/59 | HR 84 | Ht 65.0 in | Wt 113.0 lb

## 2022-10-29 DIAGNOSIS — G243 Spasmodic torticollis: Secondary | ICD-10-CM | POA: Diagnosis not present

## 2022-10-29 DIAGNOSIS — G249 Dystonia, unspecified: Secondary | ICD-10-CM | POA: Diagnosis not present

## 2022-10-29 DIAGNOSIS — I639 Cerebral infarction, unspecified: Secondary | ICD-10-CM | POA: Diagnosis not present

## 2022-10-29 MED ORDER — TETRABENAZINE 12.5 MG PO TABS: 12.5000 mg | ORAL_TABLET | Freq: Three times a day (TID) | ORAL | 5 refills | Status: DC

## 2022-10-29 NOTE — Progress Notes (Signed)
Chief Complaint  Patient presents with   cervical dystonia    Rm14, alone  Cervical Dystonia difficulty holding neck and head up. Neck pain constant but worse at times, aphasia, constant bobbblehead effect but with entire body       ASSESSMENT AND PLAN  Mandy Durham is a 77 y.o. female   History of brain injury subarachnoid hemorrhage,  MRI showed right frontal encephalomalacia,  Worsening bilateral hands tremor gait abnormality,  DaTSCAN in March 2023 showed significantly decreased dopamine receptor at bilateral putamen, head of caudate,  The most bothersome symptoms are her constant dyskinesia movement, not responding to Sinemet in the past, from record, maximum levodopa she was on was 400 mg, doubt that it is the culprit of her significant dyskinesia,  She has whole body chorea like movement, I do not think she would be a good candidate for Botox injection, will refer her to [facility] movement specialist for second opinion,  In the meantime, we will try low-dose tetrabenazine 12.5 mg 3 times a day   DIAGNOSTIC DATA (LABS, IMAGING, TESTING) - I reviewed patient records, labs, notes, testing and imaging myself where available.   MEDICAL HISTORY:  Mandy Durham is a 77 year old female, seen in request by Dr. Vickey Huger for evaluation of botulism toxin injection for cervical dystonia, her primary care physician is Dr. Drue Novel, [place],    I reviewed and summarized the referring note. PMHX HLD Anxiety Pacemaker, bradycardia, presenting with syncope Stroke  Patient is alone at today's clinical visit, driven by her husband, has been a patient of Dr. Vickey Huger since 2016  She suffered a subarachnoid traumatic hemorrhage, from fall, personally reviewed initial CT head in November 2016 documented inferior right frontal hemorrhagic contusion, moderate subarachnoid hemorrhage around the right sylvian fissure, thin subdural hematoma around the right cerebral convexity,  in 2017 tremor was  documented in both upper extremity, she had to sit on her hands to keep calm them down, initial tremor started at left hand, then began to involve both upper extremity, flapping, pill-rolling sometimes,  In 2019 visit, she reported mild hand tremor only noticeable while walking, then started on Sinemet 25/103 times a day, denies any significant change, also began to noticed difficulty talking  In 2022, document worsening tremor, but still able to complete ADLs independently, worsening stuttering, also complains of depression,  Last visit with Dr. Vickey Huger Oct 2023, she was treated with Sinemet 25/100, increased from 3 to 4 tablets a day, noticed she is moving all the time, more hesitated speech, neck tilted to the left side, chin down, pointing towards the chest,  DaTSCAN was abnormal, severely decreased radiotracer activity within the bilateral putamen and significantly decreased activity in the heads of caudate nuclei,  Personally reviewed MRI of the brain May 25, 2022, right frontal lobe and insular encephalomalacia, periventricular small vessel disease    PHYSICAL EXAM:   Vitals:   10/29/22 1251  BP: (!) 122/59  Pulse: 84  Weight: 113 lb (51.3 kg)  Height: 5\' 5"  (1.651 m)      Body mass index is 18.8 kg/m.  PHYSICAL EXAMNIATION:  Gen: NAD, conversant, well nourised, well groomed                     Cardiovascular: Regular rate rhythm, no peripheral edema, warm, nontender. Eyes: Conjunctivae clear without exudates or hemorrhage Neck: Supple, no carotid bruits. Pulmonary: Clear to auscultation bilaterally   NEUROLOGICAL EXAM:  MENTAL STATUS: Speech/cognition: Stuttered speech, word finding difficulties,  hard to provide history CRANIAL NERVES: CN II: Visual fields are full to confrontation. Pupils are round equal and briskly reactive to light. CN III, IV, VI: extraocular movement are normal. No ptosis. CN V: Facial sensation is intact to light touch CN VII: Face is  symmetric with normal eye closure  CN VIII: Hearing is normal to causal conversation. CN IX, X: Phonation is normal. CN XI: Head turning and shoulder shrug are intact  MOTOR: Able to move 4 extremities against gravity, no significant bradykinesia, no rigidity, frequent neck jerking movement  REFLEXES: Reflexes are 2  and symmetric at the biceps, triceps, knees, and ankles. Plantar responses are flexor.  SENSORY: Intact to light touch, pinprick and vibratory sensation are intact in fingers and toes.  COORDINATION: There is no trunk or limb dysmetria noted.  GAIT/STANCE: Need push-up to get up from seated position, moderate head tilt to the left side, forward chin down, unsteady,  REVIEW OF SYSTEMS:  Full 14 system review of systems performed and notable only for as above All other review of systems were negative.   ALLERGIES: No Known Allergies  HOME MEDICATIONS: Current Outpatient Medications  Medication Sig Dispense Refill   ACETAMINOPHEN PO Take 500 mg by mouth every 6 (six) hours as needed for moderate pain. Patient taking twice a day     aspirin EC 81 MG tablet Take 81 mg by mouth daily. Swallow whole.     carbidopa-levodopa (SINEMET IR) 25-100 MG tablet Take 1 tablet by mouth in the AM, 1/2 tablet at noon, 1/2 tablet in the afternoon and 1 table at bedtime 90 tablet 12   Cholecalciferol (VITAMIN D PO) Take 1 tablet by mouth 2 (two) times a week. Vitamin D     escitalopram (LEXAPRO) 10 MG tablet Take 1 tablet (10 mg total) by mouth daily. 90 tablet 1   ezetimibe (ZETIA) 10 MG tablet Take 1 tablet (10 mg total) by mouth daily. 90 tablet 1   Fluticasone-Umeclidin-Vilant (TRELEGY ELLIPTA) 100-62.5-25 MCG/ACT AEPB Inhale 1 puff into the lungs daily. 14 each 0   lactose free nutrition (BOOST PLUS) LIQD Take 237 mLs by mouth 2 (two) times daily between meals.  0   Menthol, Topical Analgesic, (BIOFREEZE ROLL-ON) 4 % GEL Apply topically as needed (for neck / shoulder pain). Use 3 or  4 times per day     nitroGLYCERIN (NITROSTAT) 0.4 MG SL tablet Place 1 tablet (0.4 mg total) under the tongue every 5 (five) minutes x 3 doses as needed for chest pain. 25 tablet 1   No current facility-administered medications for this visit.    PAST MEDICAL HISTORY: Past Medical History:  Diagnosis Date   Anxiety state, unspecified    Hyperlipidemia    Hypertension    Insomnia    LBBB (left bundle branch block)    LHC in 2002 showed normal coronaries.    Osteoarthrosis, unspecified whether generalized or localized, unspecified site    Overweight(278.02)    Parkinson disease    S/P placement of cardiac pacemaker    a. 02/21/16: Medtronic Advisa DR MRI SureScan model [id number] (serial number [id number])    Serrated adenoma of colon 2007   Symptomatic menopausal or female climacteric states    Syncope and collapse    11/12: Holter 12/12 with rare PACS, HR range 64-140, average 82, no significant arrhythmias. Echo (1/13): EF 50-55%, mild LVH, septal-lateral dyssynchrony c/w LBBB. 3-week event monitor (1/13): No significant arrhythmia.    Ventricular tachycardia (HCC) 05/24/2022  Patient with 3:1 AVB with pacemaker. She is noted to be tachycardic on telemetry but is asymptomatic  Plan    PAST SURGICAL HISTORY: Past Surgical History:  Procedure Laterality Date   COLONOSCOPY  2007   EP IMPLANTABLE DEVICE N/A 02/21/2016   Procedure: Pacemaker Implant;  Surgeon: Will Jorja Loa, MD;  Location: [facility];  Service: Cardiovascular;  Laterality: N/A;   POLYPECTOMY      FAMILY HISTORY: Family History  Problem Relation Age of Onset   Heart failure Mother    Coronary artery disease Mother    Dementia Mother    Diabetes Mother    Colon cancer Neg Hx    Breast cancer Neg Hx    Hypertension Neg Hx    Heart disease Neg Hx    Heart attack Neg Hx    Stroke Neg Hx    Rectal cancer Neg Hx    Stomach cancer Neg Hx    Esophageal cancer Neg Hx    Liver disease Neg Hx     Parkinson's disease Neg Hx     SOCIAL HISTORY: Social History   Socioeconomic History   Marital status: Married    Spouse name: Eddie   Number of children: 2   Years of education: 12   Highest education level: 12th grade  Occupational History   Occupation: retired Futures trader: [employer]  Tobacco Use   Smoking status: Never    Passive exposure: Never   Smokeless tobacco: Never  Vaping Use   Vaping status: Never Used  Substance and Sexual Activity   Alcohol use: No   Drug use: No   Sexual activity: Not Currently  Other Topics Concern   Not on file  Social History Narrative   HSG.  Married '65.     2 daughters,   '71 Neysa Bonito, lives in [place].   '77; Arline Asp, lives in [place]   4 grandchildren   Lives with husband   Right handed   Caffeine: half -1 cup a coffee a day         Social Determinants of Health   Financial Resource Strain: Low Risk  (04/30/2021)   Overall Financial Resource Strain (CARDIA)    Difficulty of Paying Living Expenses: Not hard at all  Food Insecurity: No Food Insecurity (05/25/2022)   Hunger Vital Sign    Worried About Running Out of Food in the Last Year: Never true    Ran Out of Food in the Last Year: Never true  Transportation Needs: Unmet Transportation Needs (07/17/2022)   PRAPARE - Transportation    Lack of Transportation (Medical): No    Lack of Transportation (Non-Medical): Yes  Physical Activity: Inactive (04/30/2021)   Exercise Vital Sign    Days of Exercise per Week: 0 days    Minutes of Exercise per Session: 0 min  Stress: Stress Concern Present (04/30/2021)   [employer] of Occupational Health - Occupational Stress Questionnaire    Feeling of Stress : Rather much  Social Connections: Moderately Integrated (04/30/2021)   Social Connection and Isolation Panel [NHANES]    Frequency of Communication with Friends and Family: More than three times a week    Frequency of  Social Gatherings with Friends and Family: Three times a week    Attends Religious Services: 1 to 4 times per year    Active Member of Clubs or Organizations: No    Attends Banker Meetings: Never    Marital Status:  Married  Intimate Partner Violence: Not At Risk (05/25/2022)   Humiliation, Afraid, Rape, and Kick questionnaire    Fear of Current or Ex-Partner: No    Emotionally Abused: No    Physically Abused: No    Sexually Abused: No      Levert Feinstein, M.D. Ph.D.  [facility] [address] [place], [place] [address] Ph: [phone] Fax: [phone]  CC:  Wanda Plump, MD [address] [place],  [place] [address]  Wanda Plump, MD    Total time spent reviewing the chart, obtaining history, examined patient, ordering tests, documentation, consultations and family, care coordination was 50 minutes

## 2022-10-31 LAB — CUP PACEART REMOTE DEVICE CHECK
Battery Remaining Longevity: 30 mo
Battery Voltage: 2.96 V
Brady Statistic AP VP Percent: 6.11 %
Brady Statistic AP VS Percent: 0.04 %
Brady Statistic AS VP Percent: 93.81 %
Brady Statistic AS VS Percent: 0.05 %
Brady Statistic RA Percent Paced: 6.03 %
Brady Statistic RV Percent Paced: 99.91 %
Date Time Interrogation Session: 20240816130800
Implantable Lead Connection Status: 753985
Implantable Lead Connection Status: 753985
Implantable Lead Implant Date: 20171208
Implantable Lead Implant Date: 20171208
Implantable Lead Location: 753859
Implantable Lead Location: 753860
Implantable Lead Model: 5076
Implantable Lead Model: 5076
Implantable Pulse Generator Implant Date: 20171208
Lead Channel Impedance Value: 323 Ohm
Lead Channel Impedance Value: 361 Ohm
Lead Channel Impedance Value: 399 Ohm
Lead Channel Impedance Value: 475 Ohm
Lead Channel Pacing Threshold Amplitude: 0.625 V
Lead Channel Pacing Threshold Amplitude: 0.625 V
Lead Channel Pacing Threshold Pulse Width: 0.4 ms
Lead Channel Pacing Threshold Pulse Width: 0.4 ms
Lead Channel Sensing Intrinsic Amplitude: 2 mV
Lead Channel Sensing Intrinsic Amplitude: 2 mV
Lead Channel Sensing Intrinsic Amplitude: 7.375 mV
Lead Channel Sensing Intrinsic Amplitude: 7.375 mV
Lead Channel Setting Pacing Amplitude: 2 V
Lead Channel Setting Pacing Amplitude: 2.5 V
Lead Channel Setting Pacing Pulse Width: 0.4 ms
Lead Channel Setting Sensing Sensitivity: 4 mV
Zone Setting Status: 755011
Zone Setting Status: 755011

## 2022-11-02 ENCOUNTER — Other Ambulatory Visit: Payer: Self-pay

## 2022-11-02 LAB — HM DIABETES EYE EXAM

## 2022-11-02 MED ORDER — TETRABENAZINE 12.5 MG PO TABS: 12.5000 mg | ORAL_TABLET | Freq: Three times a day (TID) | ORAL | 5 refills | Status: AC

## 2022-11-03 ENCOUNTER — Telehealth: Payer: Self-pay | Admitting: Neurology

## 2022-11-03 ENCOUNTER — Other Ambulatory Visit: Payer: Self-pay | Admitting: Adult Health

## 2022-11-03 DIAGNOSIS — G249 Dystonia, unspecified: Secondary | ICD-10-CM

## 2022-11-03 NOTE — Telephone Encounter (Signed)
Referral for neurology to see Dr. Frederich Chick fax to [facility]. Phone: [phone], Fax: [phone].

## 2022-11-04 ENCOUNTER — Telehealth: Payer: Self-pay | Admitting: Pharmacist

## 2022-11-04 ENCOUNTER — Telehealth: Payer: Self-pay | Admitting: Adult Health

## 2022-11-04 ENCOUNTER — Ambulatory Visit (INDEPENDENT_AMBULATORY_CARE_PROVIDER_SITE_OTHER): Payer: PPO | Admitting: Pharmacist

## 2022-11-04 DIAGNOSIS — Z79899 Other long term (current) drug therapy: Secondary | ICD-10-CM

## 2022-11-04 MED ORDER — EZETIMIBE 10 MG PO TABS: 10.0000 mg | ORAL_TABLET | Freq: Every day | ORAL | 1 refills | Status: AC

## 2022-11-04 NOTE — Progress Notes (Signed)
11/04/2022 Name: Mandy Durham MRN: [id number] DOB: 1945-12-21  Chief Complaint  Patient presents with   Medication Management    Follow up    Mandy Durham is a 77 y.o. year old female who was referred for medication management by their primary care provider, Wanda Plump, MD. They presented for a phone visit today.      Subjective: Called patient to check to see if she was transitioning OK with recent changes to her medication regimen -  Dose of carbidopa-levodopa was changed  to IR 25/100mg  - 1 tablet each morning, 0.5 tablet at noon and 0.5 tablet in the afternoon and 1 tablet at bedtime  She was also started on [place] / tetrabenazine 12.5mg  3 times a day - patient has not started yet. [facility] had to order medication . She plans to pick up today.   Medication Access/Adherence  Current Pharmacy:  [facility] [address] ([address] [place] - [address] [place] ([place]) [place] [address] Phone: [phone] Fax: [phone]   Patient reports affordability concerns with their medications: No  Patient reports access/transportation concerns to their pharmacy: Yes  Patient reports adherence concerns with their medications:  No  husband has been assisting with filling weekly pill containers.   She states she might miss 1 or 2 doses in a week.   Barriers to medication adherence:  Multiple comorbidities Low health literacy Poor knowledge of the illness and medication. Difficult to administer medication.  She is using alarm on her phone to help her remember to take medications.   Depression/Anxiety  :  Current medications: escitalopram 10mg  daily   Other medications tried: Was prescribed hydroxyzine 10mg  twice a day at [place] but patient states she has only taken once at home because it made her feel loopy.  Took lorazepam for anxiety prior to recent hospitalization for altered mental status but this was discontinued at discharge.     Objective:  Lab Results  Component Value Date   HGBA1C 6.6 (H) 01/28/2022    Lab Results  Component Value Date   CREATININE 0.73 06/23/2022   BUN 28 (H) 06/23/2022   NA 137 06/23/2022   K 4.0 06/23/2022   CL 103 06/23/2022   CO2 21 (L) 06/23/2022    Lab Results  Component Value Date   CHOL 200 05/26/2021   HDL 108.70 05/26/2021   LDLCALC 76 05/26/2021   LDLDIRECT 106.9 11/03/2012   TRIG 75.0 05/26/2021   CHOLHDL 2 05/26/2021    Medications Reviewed Today     Reviewed by Henrene Pastor, RPH-CPP (Pharmacist) on 11/04/22 at 0930  Med List Status: <None>   Medication Order Taking? Sig Documenting Provider Last Dose Status Informant  ACETAMINOPHEN PO [id number] Yes Take 500 mg by mouth every 6 (six) hours as needed for moderate pain. Patient taking twice a day [provider] Taking Active Spouse/Significant Other, Pharmacy Records           Med Note [facility], Karlene Lineman Mar 19, 2022  7:05 AM)    aspirin EC 81 MG tablet [id number] Yes Take 81 mg by mouth daily. Swallow whole. [provider] Taking Active Spouse/Significant Other, Pharmacy Records  carbidopa-levodopa (SINEMET IR) 25-100 MG tablet [id number] Yes Take 1 tablet by mouth in the AM, 1/2 tablet at noon, 1/2 tablet in the afternoon and 1 table at bedtime Butch Penny, NP Taking Active   Cholecalciferol (VITAMIN D PO) [id number] No Take 1 tablet by mouth 2 (  two) times a week. Vitamin D  Patient not taking: Reported on 11/04/2022   [provider] Not Taking Active Spouse/Significant Other, Pharmacy Records  escitalopram (LEXAPRO) 10 MG tablet [id number] Yes Take 1 tablet (10 mg total) by mouth daily. Wanda Plump, MD Taking Active Spouse/Significant Other, Pharmacy Records  ezetimibe (ZETIA) 10 MG tablet [id number] Yes Take 1 tablet (10 mg total) by mouth daily. Wanda Plump, MD Taking Active   Fluticasone-Umeclidin-Vilant (TRELEGY ELLIPTA) 100-62.5-25 MCG/ACT AEPB [id number] No Inhale 1 puff  into the lungs daily.  Patient not taking: Reported on 11/04/2022   Wanda Plump, MD Not Taking Active            Med Note Clydie Braun, Glenna Durand   Fri Aug 14, 2022 12:12 PM) Only taking about 1 or 2 times per week  lactose free nutrition (BOOST PLUS) LIQD [id number] Yes Take 237 mLs by mouth 2 (two) times daily between meals. Joseph Art, DO Taking Active Spouse/Significant Other, Pharmacy Records  Menthol, Topical Analgesic, (BIOFREEZE ROLL-ON) 4 % GEL [id number] Yes Apply topically as needed (for neck / shoulder pain). Use 3 or 4 times per day [provider] Taking Active   nitroGLYCERIN (NITROSTAT) 0.4 MG SL tablet [id number] Yes Place 1 tablet (0.4 mg total) under the tongue every 5 (five) minutes x 3 doses as needed for chest pain. [name], PA-C Taking Active Spouse/Significant Other, Pharmacy Records           Med Note [facility], Raynaldo Opitz Sep 21, 2022  2:42 PM) PRN  tetrabenazine [name]) 12.5 MG tablet [id number] No Take 1 tablet (12.5 mg total) by mouth 3 (three) times daily.  Patient not taking: Reported on 11/04/2022   Levert Feinstein, MD Not Taking Active                Assessment/Plan:   Depression/Anxiety: A little more social interaction recently. has been better for patient  - continue to take escitalopram 10mg  daily    Medication Management: Current adherence strategy has improved adherence  Patient endorses that her husband has been helping fill her weekly pill containers.  Continue to use phone alarm to remind when to take medications (9am / 1pm / 5pm / 9pm)  Patient is past due to have ezetimbe refilled. Needs updated Rx sent to [facility] Patient will also pick up tetrabenazine today - discuss administration directions and side effect to monitor for.   Meds ordered this encounter  Medications   ezetimibe (ZETIA) 10 MG tablet    Sig: Take 1 tablet (10 mg total) by mouth daily.    Dispense:  90 tablet    Refill:  1   Follow Up Plan: 2 to 4  weeks  Henrene Pastor, PharmD Clinical Pharmacist [facility]

## 2022-11-04 NOTE — Telephone Encounter (Signed)
Bethany:  Can you call the patient and let her know that Dr. Terrace Arabia and I have been discussing her symptoms. We would like to check blood work to r/o Huntington's disease.  If amenable, I can order the blood work.

## 2022-11-04 NOTE — Telephone Encounter (Signed)
Called patient to check to see if she was transitioning OK with recent changes to her medication regimen -  Dose of carbidopa-levodopa was changed  to IR 25/100mg  - 1 tablet each morning, 0.5 tablet at noon and 0.5 tablet in the afternoon and 1 tablet at bedtime  She was also started on [place] / tetrabenazine 12.5mg  3 times a day - patient has not started yet. [facility] had to order medication . She plans to pick up today.   Patient endorses that her husband has been helping fill her weekly pill containers.  She states she might miss 1 or 2 doses in a week.   Patient is past due to have ezetimbe refills. Needs updated Rx sent to [facility]  Meds ordered this encounter  Medications   ezetimibe (ZETIA) 10 MG tablet    Sig: Take 1 tablet (10 mg total) by mouth daily.    Dispense:  90 tablet    Refill:  1

## 2022-11-05 ENCOUNTER — Telehealth: Payer: Self-pay | Admitting: Neurology

## 2022-11-05 ENCOUNTER — Telehealth: Payer: Self-pay

## 2022-11-05 ENCOUNTER — Other Ambulatory Visit (HOSPITAL_COMMUNITY): Payer: Self-pay

## 2022-11-05 DIAGNOSIS — G255 Other chorea: Secondary | ICD-10-CM

## 2022-11-05 NOTE — Telephone Encounter (Signed)
[facility] calling to report that a PA is needed for pt's tetrabenazine (XENAZINE) 12.5 MG tablet

## 2022-11-05 NOTE — Telephone Encounter (Signed)
Pharmacy Patient Advocate Encounter  Received notification from [employer] that Prior Authorization for Tetrabenazine 12.5MG  tablets has been APPROVED from 11/05/2022 to 02/03/2023.   PA #/Case ID/Reference #: PA Case ID #: [id number]

## 2022-11-05 NOTE — Telephone Encounter (Signed)
I spoke with the patient.  She is amenable to obtaining blood work to rule out Huntington's disease however she is concerned about cost.  I told her we would be in touch with next steps.  The other questions were answered during the call.

## 2022-11-05 NOTE — Telephone Encounter (Signed)
G25.5 chorea

## 2022-11-05 NOTE — Telephone Encounter (Signed)
Pharmacy Patient Advocate Encounter   Received notification from Physician's Office that prior authorization for Tetrabenazine 12.5MG  tablets is required/requested.   Insurance verification completed.   The patient is insured through [employer] .   Per test claim: PA required; PA submitted to [employer] via CoverMyMeds Key/confirmation #/EOC [facility] Status is pending

## 2022-11-05 NOTE — Telephone Encounter (Signed)
Can you verify the Diagnosis code to use for this medication? I know it is usually used to treat Huntington's disease but did not see a DX for it-Please advise-

## 2022-11-05 NOTE — Telephone Encounter (Signed)
Dr. Terrace Arabia,  What diagnosis do you want the pa team to use for Tetrabenazine 12.5MG  tablets Thanks,  Rodney Wigger

## 2022-11-06 NOTE — Progress Notes (Signed)
Remote pacemaker transmission.   

## 2022-11-09 ENCOUNTER — Ambulatory Visit: Payer: PPO | Admitting: Neurology

## 2022-11-09 DIAGNOSIS — G249 Dystonia, unspecified: Secondary | ICD-10-CM

## 2022-11-09 DIAGNOSIS — G243 Spasmodic torticollis: Secondary | ICD-10-CM

## 2022-11-09 DIAGNOSIS — I639 Cerebral infarction, unspecified: Secondary | ICD-10-CM

## 2022-11-10 NOTE — Telephone Encounter (Signed)
Called the patient again. She states her husband came home but is gone again and she wants me to call her Thursday. He will be gone tomorrow.

## 2022-11-10 NOTE — Telephone Encounter (Signed)
I called the patient to give her the option of calling her insurance to see if its covered. She asked for me to call her back in a couple of hours when her husband is there.

## 2022-11-12 DIAGNOSIS — G243 Spasmodic torticollis: Secondary | ICD-10-CM | POA: Diagnosis not present

## 2022-11-12 DIAGNOSIS — G20C Parkinsonism, unspecified: Secondary | ICD-10-CM | POA: Diagnosis not present

## 2022-11-12 NOTE — Telephone Encounter (Signed)
I spoke with the pt's husband and advised him of recommendation to test for Huntington's Disease. The husband wants to proceed and he said if insurance doesn't pay for the lab, he will. He is aware the cash price around $260. He will plan to bring the patient next Tuesday morning. He verbalized appreciation for the call.

## 2022-11-17 ENCOUNTER — Other Ambulatory Visit (INDEPENDENT_AMBULATORY_CARE_PROVIDER_SITE_OTHER): Payer: Self-pay

## 2022-11-17 ENCOUNTER — Other Ambulatory Visit: Payer: Self-pay | Admitting: Adult Health

## 2022-11-17 DIAGNOSIS — G255 Other chorea: Secondary | ICD-10-CM

## 2022-11-17 DIAGNOSIS — Z0289 Encounter for other administrative examinations: Secondary | ICD-10-CM

## 2022-11-17 DIAGNOSIS — G249 Dystonia, unspecified: Secondary | ICD-10-CM

## 2022-11-18 ENCOUNTER — Ambulatory Visit: Payer: PPO | Admitting: Podiatry

## 2022-11-18 ENCOUNTER — Encounter: Payer: Self-pay | Admitting: Podiatry

## 2022-11-18 DIAGNOSIS — M79674 Pain in right toe(s): Secondary | ICD-10-CM

## 2022-11-18 DIAGNOSIS — B351 Tinea unguium: Secondary | ICD-10-CM | POA: Diagnosis not present

## 2022-11-18 DIAGNOSIS — M79675 Pain in left toe(s): Secondary | ICD-10-CM | POA: Diagnosis not present

## 2022-11-18 LAB — HM DIABETES FOOT EXAM

## 2022-11-18 NOTE — Progress Notes (Signed)
  Subjective:  Patient ID: Mandy Durham, female    DOB: 01/19/1946,   MRN: [id number]  No chief complaint on file.   77 y.o. female presents for concern of thickened elongated and painful nails that are difficult to trim. Requesting to have them trimmed today. Relates burning and tingling in their feet. Patient is diabetic and last A1c was  Lab Results  Component Value Date   HGBA1C 6.6 (H) 01/28/2022   .   PCP:  Wanda Plump, MD    . Denies any other pedal complaints. Denies n/v/f/c.   Past Medical History:  Diagnosis Date   Anxiety state, unspecified    Hyperlipidemia    Hypertension    Insomnia    LBBB (left bundle branch block)    LHC in 2002 showed normal coronaries.    Osteoarthrosis, unspecified whether generalized or localized, unspecified site    Overweight(278.02)    Parkinson disease    S/P placement of cardiac pacemaker    a. 02/21/16: [employer] Advisa DR MRI SureScan model U7OZ36 (serial number [id number])    Serrated adenoma of colon 2007   Symptomatic menopausal or female climacteric states    Syncope and collapse    11/12: Holter 12/12 with rare PACS, HR range 64-140, average 82, no significant arrhythmias. Echo (1/13): EF 50-55%, mild LVH, septal-lateral dyssynchrony c/w LBBB. 3-week event monitor (1/13): No significant arrhythmia.    Ventricular tachycardia (HCC) 05/24/2022   Patient with 3:1 AVB with pacemaker. She is noted to be tachycardic on telemetry but is asymptomatic  Plan    Objective:  Physical Exam: Vascular: DP/PT pulses 2/4 bilateral. CFT <3 seconds. Absent hair growth on digits. Edema noted to bilateral lower extremities. Xerosis noted bilaterally.  Skin. No lacerations or abrasions bilateral feet. Nails 1-5 bilateral  are thickened discolored and elongated with subungual debris.  Musculoskeletal: MMT 5/5 bilateral lower extremities in DF, PF, Inversion and Eversion. Deceased ROM in DF of ankle joint.  Neurological: Sensation intact to light  touch. Protective sensation diminished bilateral.    Assessment:   1. Pain due to onychomycosis of toenails of both feet      Plan:  Patient was evaluated and treated and all questions answered. -Discussed and educated patient on diabetic foot care, especially with  regards to the vascular, neurological and musculoskeletal systems.  -Stressed the importance of good glycemic control and the detriment of not  controlling glucose levels in relation to the foot. -Discussed supportive shoes at all times and checking feet regularly.  -Mechanically debrided all nails 1-5 bilateral using sterile nail nipper and filed with dremel without incident  -Answered all patient questions -Patient to return  in 3 months for at risk foot care -Patient advised to call the office if any problems or questions arise in the meantime.   Louann Sjogren, DPM

## 2022-11-19 ENCOUNTER — Encounter: Payer: Self-pay | Admitting: Internal Medicine

## 2022-11-19 NOTE — Telephone Encounter (Signed)
Opened in error

## 2022-11-20 ENCOUNTER — Encounter: Payer: Self-pay | Admitting: Internal Medicine

## 2022-11-20 ENCOUNTER — Ambulatory Visit: Payer: Self-pay

## 2022-11-20 NOTE — Patient Outreach (Signed)
  Care Coordination   Initial Visit Note   11/20/2022 Name: Mandy Durham MRN: [id number] DOB: 05/15/1945  Mandy Durham is a 77 y.o. year old female who sees Mandy Durham, Mandy Rod, MD for primary care. I spoke with  Mandy Durham by phone today.  What matters to the patients health and wellness today?  Mandy Durham reports she is "doing ok, not like I want to be, but I am doing a little better". She request RNCM call back to speak with her husband when he is in.   Goals Addressed             This Visit's Progress    Care Coordination Activities       Interventions Today    Flowsheet Row Most Recent Value  General Interventions   General Interventions Discussed/Reviewed General Interventions Reviewed  [call to assess care coordination needs. referral to care guide to reschedule telephone call per patient request]            SDOH assessments and interventions completed:  Yes  Care Coordination Interventions:  Yes, provided   Follow up plan:  care guide to reschedule appointment    Encounter Outcome:  Patient Visit Completed   Kathyrn Sheriff, RN, MSN, BSN, CCM Care Management Coordinator [phone]

## 2022-11-23 ENCOUNTER — Encounter: Payer: Self-pay | Admitting: Internal Medicine

## 2022-11-24 NOTE — Procedures (Signed)
   HISTORY: 77 year old female, with slow worsening choreatic movement,  TECHNIQUE:  This is a routine 16 channel EEG recording with one channel devoted to a limited EKG recording.  It was performed during wakefulness, drowsiness and asleep.  Photic stimulation were performed as activating procedures.  There are minimum muscle and movement artifact noted.  Upon maximum arousal, posterior and frontal waking rhythm consistent of dysrhythmic mixed theta and delta range activity.  Activities are symmetric over the bilateral posterior derivations and attenuated with eye opening.  Photic stimulation did not alter the tracing.  Hyperventilation was not performed  During EEG recording, patient developed drowsiness and no deeper stage of sleep was achieved  During EEG recording, there was no epileptiform discharge noted.  EKG demonstrate normal sinus rhythm.  CONCLUSION: This is a significant abnormal EEG.  There is electrodiagnostic evidence of severe slowing of background activity, indicating severe bihemispheric malfunction.  Mandy Durham, M.D. Ph.D.  [facility] [address] [place], [place] [address] Phone: [phone] Fax:      [phone]

## 2022-11-25 ENCOUNTER — Telehealth: Payer: PPO

## 2022-11-27 ENCOUNTER — Other Ambulatory Visit: Payer: PPO | Admitting: Pharmacist

## 2022-11-27 ENCOUNTER — Telehealth: Payer: Self-pay | Admitting: Pharmacist

## 2022-11-27 NOTE — Telephone Encounter (Signed)
Unsuccessful phone outreach to patient by Clinical Pharmacist Practitioner to for follow up medication management.  LM on VM with CB# [phone] or [phone]

## 2022-11-28 ENCOUNTER — Other Ambulatory Visit: Payer: Self-pay

## 2022-11-28 ENCOUNTER — Encounter (HOSPITAL_COMMUNITY): Payer: Self-pay

## 2022-11-28 ENCOUNTER — Emergency Department (HOSPITAL_COMMUNITY): Payer: PPO

## 2022-11-28 ENCOUNTER — Emergency Department (HOSPITAL_COMMUNITY)
Admission: EM | Admit: 2022-11-28 | Discharge: 2022-12-02 | Disposition: A | Discharge status: Rehabilitation Facility | Payer: PPO | Attending: Emergency Medicine | Admitting: Emergency Medicine

## 2022-11-28 DIAGNOSIS — R531 Weakness: Secondary | ICD-10-CM | POA: Diagnosis not present

## 2022-11-28 DIAGNOSIS — Z950 Presence of cardiac pacemaker: Secondary | ICD-10-CM | POA: Diagnosis not present

## 2022-11-28 DIAGNOSIS — I110 Hypertensive heart disease with heart failure: Secondary | ICD-10-CM | POA: Insufficient documentation

## 2022-11-28 DIAGNOSIS — I700 Atherosclerosis of aorta: Secondary | ICD-10-CM | POA: Diagnosis not present

## 2022-11-28 DIAGNOSIS — R0602 Shortness of breath: Secondary | ICD-10-CM | POA: Insufficient documentation

## 2022-11-28 DIAGNOSIS — E119 Type 2 diabetes mellitus without complications: Secondary | ICD-10-CM | POA: Diagnosis not present

## 2022-11-28 DIAGNOSIS — Z7982 Long term (current) use of aspirin: Secondary | ICD-10-CM | POA: Insufficient documentation

## 2022-11-28 DIAGNOSIS — I5030 Unspecified diastolic (congestive) heart failure: Secondary | ICD-10-CM | POA: Insufficient documentation

## 2022-11-28 DIAGNOSIS — Z1152 Encounter for screening for COVID-19: Secondary | ICD-10-CM | POA: Diagnosis not present

## 2022-11-28 DIAGNOSIS — G9389 Other specified disorders of brain: Secondary | ICD-10-CM | POA: Diagnosis not present

## 2022-11-28 DIAGNOSIS — S0093XA Contusion of unspecified part of head, initial encounter: Secondary | ICD-10-CM | POA: Diagnosis not present

## 2022-11-28 DIAGNOSIS — R509 Fever, unspecified: Secondary | ICD-10-CM | POA: Diagnosis not present

## 2022-11-28 DIAGNOSIS — J929 Pleural plaque without asbestos: Secondary | ICD-10-CM | POA: Diagnosis not present

## 2022-11-28 DIAGNOSIS — R9082 White matter disease, unspecified: Secondary | ICD-10-CM | POA: Diagnosis not present

## 2022-11-28 DIAGNOSIS — G20C Parkinsonism, unspecified: Secondary | ICD-10-CM | POA: Diagnosis not present

## 2022-11-28 LAB — CBC WITH DIFFERENTIAL/PLATELET
Abs Immature Granulocytes: 0.02 10*3/uL (ref 0.00–0.07)
Basophils Absolute: 0 10*3/uL (ref 0.0–0.1)
Basophils Relative: 1 %
Eosinophils Absolute: 0 10*3/uL (ref 0.0–0.5)
Eosinophils Relative: 1 %
HCT: 39.2 % (ref 36.0–46.0)
Hemoglobin: 12.4 g/dL (ref 12.0–15.0)
Immature Granulocytes: 0 %
Lymphocytes Relative: 23 %
Lymphs Abs: 1.8 10*3/uL (ref 0.7–4.0)
MCH: 28.3 pg (ref 26.0–34.0)
MCHC: 31.6 g/dL (ref 30.0–36.0)
MCV: 89.5 fL (ref 80.0–100.0)
Monocytes Absolute: 0.8 10*3/uL (ref 0.1–1.0)
Monocytes Relative: 9 %
Neutro Abs: 5.5 10*3/uL (ref 1.7–7.7)
Neutrophils Relative %: 66 %
Platelets: 287 10*3/uL (ref 150–400)
RBC: 4.38 MIL/uL (ref 3.87–5.11)
RDW: 14.6 % (ref 11.5–15.5)
WBC: 8.2 10*3/uL (ref 4.0–10.5)
nRBC: 0 % (ref 0.0–0.2)

## 2022-11-28 LAB — COMPREHENSIVE METABOLIC PANEL
ALT: 29 U/L (ref 0–44)
AST: 58 U/L — ABNORMAL HIGH (ref 15–41)
Albumin: 3.6 g/dL (ref 3.5–5.0)
Alkaline Phosphatase: 54 U/L (ref 38–126)
Anion gap: 9 (ref 5–15)
BUN: 28 mg/dL — ABNORMAL HIGH (ref 8–23)
CO2: 28 mmol/L (ref 22–32)
Calcium: 9.2 mg/dL (ref 8.9–10.3)
Chloride: 104 mmol/L (ref 98–111)
Creatinine, Ser: 0.91 mg/dL (ref 0.44–1.00)
GFR, Estimated: >60 mL/min (ref >60)
Glucose, Bld: 107 mg/dL — ABNORMAL HIGH (ref 70–99)
Potassium: 3.5 mmol/L (ref 3.5–5.1)
Sodium: 141 mmol/L (ref 135–145)
Total Bilirubin: 0.8 mg/dL (ref 0.3–1.2)
Total Protein: 6.3 g/dL — ABNORMAL LOW (ref 6.5–8.1)

## 2022-11-28 LAB — RESP PANEL BY RT-PCR (RSV, FLU A&B, COVID)  RVPGX2
Influenza A by PCR: NEGATIVE
Influenza B by PCR: NEGATIVE
Resp Syncytial Virus by PCR: NEGATIVE
SARS Coronavirus 2 by RT PCR: NEGATIVE

## 2022-11-28 LAB — URINALYSIS, MICROSCOPIC (REFLEX)

## 2022-11-28 LAB — URINALYSIS, ROUTINE W REFLEX MICROSCOPIC
Bilirubin Urine: NEGATIVE
Glucose, UA: NEGATIVE mg/dL
Ketones, ur: NEGATIVE mg/dL
Leukocytes,Ua: NEGATIVE
Nitrite: NEGATIVE
Protein, ur: NEGATIVE mg/dL
Specific Gravity, Urine: >1.03 — ABNORMAL HIGH (ref 1.005–1.030)
pH: 6 (ref 5.0–8.0)

## 2022-11-28 MED ORDER — CARBIDOPA-LEVODOPA 25-100 MG PO TABS
1.0000 | ORAL_TABLET | Freq: Three times a day (TID) | ORAL | Status: DC
  Administered 2022-11-28 – 2022-12-02 (×12): 1 via ORAL
  Filled 2022-11-28 (×15): qty 1

## 2022-11-28 MED ORDER — ESCITALOPRAM OXALATE 10 MG PO TABS
10.0000 mg | ORAL_TABLET | Freq: Every day | ORAL | Status: DC
  Administered 2022-11-28 – 2022-12-02 (×5): 10 mg via ORAL
  Filled 2022-11-28 (×5): qty 1

## 2022-11-28 MED ORDER — UMECLIDINIUM BROMIDE 62.5 MCG/ACT IN AEPB
1.0000 | INHALATION_SPRAY | Freq: Every day | RESPIRATORY_TRACT | Status: DC
  Administered 2022-11-29 – 2022-12-02 (×4): 1 via RESPIRATORY_TRACT
  Filled 2022-11-28: qty 7

## 2022-11-28 MED ORDER — ACETAMINOPHEN 500 MG PO TABS
500.0000 mg | ORAL_TABLET | Freq: Four times a day (QID) | ORAL | Status: DC | PRN
  Administered 2022-11-28 – 2022-12-01 (×6): 500 mg via ORAL
  Filled 2022-11-28 (×6): qty 1

## 2022-11-28 MED ORDER — EZETIMIBE 10 MG PO TABS
10.0000 mg | ORAL_TABLET | Freq: Every day | ORAL | Status: DC
  Administered 2022-11-28 – 2022-12-02 (×5): 10 mg via ORAL
  Filled 2022-11-28 (×5): qty 1

## 2022-11-28 MED ORDER — ASPIRIN 81 MG PO TBEC
81.0000 mg | DELAYED_RELEASE_TABLET | Freq: Every day | ORAL | Status: DC
  Administered 2022-11-28 – 2022-12-02 (×5): 81 mg via ORAL
  Filled 2022-11-28 (×5): qty 1

## 2022-11-28 MED ORDER — FLUTICASONE FUROATE-VILANTEROL 100-25 MCG/ACT IN AEPB
1.0000 | INHALATION_SPRAY | Freq: Every day | RESPIRATORY_TRACT | Status: DC
  Administered 2022-11-29 – 2022-12-02 (×4): 1 via RESPIRATORY_TRACT
  Filled 2022-11-28: qty 28

## 2022-11-28 NOTE — Care Management (Signed)
Transition of Care [facility]) - Emergency Department Mini Assessment   Patient Details  Name: Mandy Durham MRN: [id number] Date of Birth: 1945-06-17  [facility]) CM/SW Contact:    Lockie Pares, RN Phone Number: 11/28/2022, 5:51 PM   Clinical Narrative: Patient with parkinsons presented with fall. Normally ambulatory. Called husband to discuss, left VM to call back. Discussed via securechat with provider and nursing. Patient is ordered PT assessment. This will be done in AM. The patient previously had HH with [facility] ended [place] 2024.   TOC will follow for needs   ED Mini Assessment: What brought you to the Emergency Department? : Fall  Barriers to Discharge: Unsafe home situation  Barrier interventions: Offered home health awaiting PT consult in AM          Patient Contact and Communications        ,                 Admission diagnosis:  Weakness; UTI Patient Active Problem List   Diagnosis Date Noted   Cerebrovascular accident (CVA) (HCC) 10/29/2022   Diabetes (HCC) 09/21/2022   Ventricular tachycardia (HCC) 05/24/2022   Encephalopathy, metabolic 05/24/2022   Malnutrition of moderate degree 03/20/2022   COVID-19 03/19/2022   Fluency disorder associated with underlying disease 12/18/2021   Diplopia 12/18/2021   Cervical dystonia 12/18/2021   Dyskinesia 12/18/2021   Neuropathy involving both lower extremities 06/09/2021   Complaints of weakness of lower extremity 06/09/2021   Abnormal brain MRI 06/09/2021   Neck muscle weakness 06/02/2021   Diastolic CHF (HCC) 05/27/2021   Kyphosis due to degeneration of spine 05/05/2021   Drug-induced orofacial dyskinesia 05/05/2021   Hyperkinesis 05/05/2021   Traumatic intracerebral hemorrhage with loss of consciousness of 31 minutes to 59 minutes (HCC) 05/05/2021   Atypical parkinsonism 09/07/2018   Functional dyspepsia 09/08/2017   Slow transit constipation 09/08/2017   S/P placement of cardiac  pacemaker    Mobitz II 02/20/2016   Nystagmus, end-position 02/12/2016   Dizziness and giddiness 02/12/2016   Flapping tremor 02/12/2016   Chronic fatigue 10/28/2015   PCP NOTES >>>> 02/13/2015   TBI (traumatic brain injury) (HCC) 01/28/2015   Essential hypertension    Acute post-traumatic headache, not intractable    Vertigo due to brain injury (HCC)    SAH (subarachnoid hemorrhage) (HCC) 01/21/2015   Annual physical exam 09/07/2014   Orthostatic hypotension 08/30/2014   Acromioclavicular arthrosis 03/21/2014   Allergic rhinitis 02/27/2014   SI (sacroiliac) joint dysfunction 02/02/2014   Gastrocnemius strain, left 12/11/2013   Back pain 08/22/2013   LBBB (left bundle branch block) 05/13/2013   Borderline diabetes    Insomnia 01/12/2008   HLD (hyperlipidemia) 02/04/2007   Anxiety 10/12/2006   Osteoarthritis 10/12/2006   PCP:  Wanda Plump, MD Pharmacy:   [facility] [id number] ([address]), [place] - [address] [place] ([place]) [place] [address] Phone: [phone] Fax: [phone]

## 2022-11-28 NOTE — ED Notes (Addendum)
Pt care taken at this time. Awaiting placement. Is eating a sandwich no complaints at this time. Daughters at bedside. Speech is studdered at baseline.

## 2022-11-28 NOTE — ED Provider Notes (Signed)
[facility] AT [facility] Provider Note   CSN: [id number] Arrival date & time: 11/28/22  1127     History  Chief Complaint  Patient presents with   Weakness    Mandy Durham is a 77 y.o. female.  With past medical history of Parkinson disease, CVA, type 2 diabetes, hypertension and permanent pacemaker placement who presents to the ED for generalized weakness, patient's husband at bedside has noticed her to be increasingly weak over the last 3 days.  She is typically able to get up and walk under her own power but has not been able to do this during this timeframe.  He noted her to be no sideways in bed earlier this morning and is concerned the patient may have struck her head.  She has not had any respiratory distress or fevers.  Husband does note that her urine has been more malodorous than usual and voices concern for urinary tract infection.  The patient herself is able to respond to yes/no questions and denies headaches, chest pain, shortness of breath and abdominal pain  Additional history limited in the setting of chronic speech deficits related to Parkinson disease   Weakness      Home Medications Prior to Admission medications   Medication Sig Start Date End Date Taking? Authorizing Provider  ACETAMINOPHEN PO Take 500 mg by mouth every 6 (six) hours as needed for moderate pain. Patient taking twice a day    [provider]  aspirin EC 81 MG tablet Take 81 mg by mouth daily. Swallow whole.    [provider]  carbidopa-levodopa (SINEMET IR) 25-100 MG tablet Take 1 tablet by mouth in the AM, 1/2 tablet at noon, 1/2 tablet in the afternoon and 1 table at bedtime 10/20/22   Butch Penny, NP  Cholecalciferol (VITAMIN D PO) Take 1 tablet by mouth 2 (two) times a week. Vitamin D Patient not taking: Reported on 11/04/2022    [provider]  escitalopram (LEXAPRO) 10 MG tablet Take 1 tablet (10 mg total) by mouth daily. 05/20/22    Wanda Plump, MD  ezetimibe (ZETIA) 10 MG tablet Take 1 tablet (10 mg total) by mouth daily. 11/04/22   Wanda Plump, MD  Fluticasone-Umeclidin-Vilant (TRELEGY ELLIPTA) 100-62.5-25 MCG/ACT AEPB Inhale 1 puff into the lungs daily. Patient not taking: Reported on 11/04/2022 07/06/22   Wanda Plump, MD  lactose free nutrition (BOOST PLUS) LIQD Take 237 mLs by mouth 2 (two) times daily between meals. 03/26/22   Joseph Art, DO  Menthol, Topical Analgesic, (BIOFREEZE ROLL-ON) 4 % GEL Apply topically as needed (for neck / shoulder pain). Use 3 or 4 times per day    [provider]  nitroGLYCERIN (NITROSTAT) 0.4 MG SL tablet Place 1 tablet (0.4 mg total) under the tongue every 5 (five) minutes x 3 doses as needed for chest pain. 08/26/21   [name], PA-C  tetrabenazine [name]) 12.5 MG tablet Take 1 tablet (12.5 mg total) by mouth 3 (three) times daily. Patient not taking: Reported on 11/04/2022 11/02/22   Levert Feinstein, MD      Allergies    Patient has no known allergies.    Review of Systems   Review of Systems  Neurological:  Positive for weakness.  All other systems reviewed and are negative.   Physical Exam Updated Vital Signs BP (!) 147/74   Pulse 90   Temp 98.2 F (36.8 C) (Oral)   Resp 15  SpO2 100%  Physical Exam Vitals and nursing note reviewed.  HENT:     Head: Normocephalic and atraumatic.  Eyes:     Pupils: Pupils are equal, round, and reactive to light.  Cardiovascular:     Rate and Rhythm: Normal rate and regular rhythm.  Pulmonary:     Effort: Pulmonary effort is normal.     Breath sounds: Normal breath sounds.  Abdominal:     Palpations: Abdomen is soft.     Tenderness: There is no abdominal tenderness.  Skin:    General: Skin is warm and dry.  Neurological:     General: No focal deficit present.     Mental Status: She is alert. Mental status is at baseline.     Comments: Patient able to answer yes/no questions but has difficulty with speech  production with delayed response time, baseline per husband  Psychiatric:        Mood and Affect: Mood normal.     ED Results / Procedures / Treatments   Labs (all labs ordered are listed, but only abnormal results are displayed) Labs Reviewed  COMPREHENSIVE METABOLIC PANEL - Abnormal; Notable for the following components:      Result Value   Glucose, Bld 107 (*)    BUN 28 (*)    Total Protein 6.3 (*)    AST 58 (*)    All other components within normal limits  URINALYSIS, ROUTINE W REFLEX MICROSCOPIC - Abnormal; Notable for the following components:   Specific Gravity, Urine >1.030 (*)    Hgb urine dipstick TRACE (*)    All other components within normal limits  URINALYSIS, MICROSCOPIC (REFLEX) - Abnormal; Notable for the following components:   Bacteria, UA RARE (*)    All other components within normal limits  RESP PANEL BY RT-PCR (RSV, FLU A&B, COVID)  [id number]  CBC WITH DIFFERENTIAL/PLATELET    EKG EKG Interpretation Date/Time:  Saturday November 28 2022 13:17:22 EDT Ventricular Rate:  197 PR Interval:    QRS Duration:  161 QT Interval:  382 QTC Calculation: 633 R Axis:   270  Text Interpretation: VENTRICULAR PACED RHYTHM Artifact in lead(s) III aVL aVF V1 V2 V3 V4 V5 V6 Confirmed by Gwyneth Sprout ([id number]) on 11/28/2022 4:21:14 PM  Radiology CT Head Wo Contrast  Result Date: 11/28/2022 CLINICAL DATA:  Fall last night head trauma, hematoma EXAM: CT HEAD WITHOUT CONTRAST CT CERVICAL SPINE WITHOUT CONTRAST TECHNIQUE: Multidetector CT imaging of the head and cervical spine was performed following the standard protocol without intravenous contrast. Multiplanar CT image reconstructions of the cervical spine were also generated. RADIATION DOSE REDUCTION: This exam was performed according to the departmental dose-optimization program which includes automated exposure control, adjustment of the mA and/or kV according to patient size and/or use of iterative reconstruction  technique. COMPARISON:  05/24/2022 FINDINGS: CT HEAD FINDINGS Brain: No evidence of acute infarction, hemorrhage, hydrocephalus, extra-axial collection or mass lesion/mass effect. Extensive periventricular and deep white matter hypodensity. Unchanged right frontal encephalomalacia. Vascular: No hyperdense vessel or unexpected calcification. Skull: Normal. Negative for fracture or focal lesion. Sinuses/Orbits: No acute finding. Other: None. CT CERVICAL SPINE FINDINGS Alignment: Degenerative straightening and reversal of the normal cervical lordosis. Skull base and vertebrae: No acute fracture. No primary bone lesion or focal pathologic process. Soft tissues and spinal canal: No prevertebral fluid or swelling. No visible canal hematoma. Disc levels: Moderate to severe disc space height loss and osteophytosis, worst from C4-C7. Upper chest: Negative. Other: None. IMPRESSION: 1. No acute intracranial  pathology. Small-vessel white matter disease and unchanged right frontal encephalomalacia. 2. No fracture or static subluxation of the cervical spine. 3. Moderate to severe cervical disc degenerative disease. Electronically Signed   By: Jearld Lesch M.D.   On: 11/28/2022 13:44   CT Cervical Spine Wo Contrast  Result Date: 11/28/2022 CLINICAL DATA:  Fall last night head trauma, hematoma EXAM: CT HEAD WITHOUT CONTRAST CT CERVICAL SPINE WITHOUT CONTRAST TECHNIQUE: Multidetector CT imaging of the head and cervical spine was performed following the standard protocol without intravenous contrast. Multiplanar CT image reconstructions of the cervical spine were also generated. RADIATION DOSE REDUCTION: This exam was performed according to the departmental dose-optimization program which includes automated exposure control, adjustment of the mA and/or kV according to patient size and/or use of iterative reconstruction technique. COMPARISON:  05/24/2022 FINDINGS: CT HEAD FINDINGS Brain: No evidence of acute infarction,  hemorrhage, hydrocephalus, extra-axial collection or mass lesion/mass effect. Extensive periventricular and deep white matter hypodensity. Unchanged right frontal encephalomalacia. Vascular: No hyperdense vessel or unexpected calcification. Skull: Normal. Negative for fracture or focal lesion. Sinuses/Orbits: No acute finding. Other: None. CT CERVICAL SPINE FINDINGS Alignment: Degenerative straightening and reversal of the normal cervical lordosis. Skull base and vertebrae: No acute fracture. No primary bone lesion or focal pathologic process. Soft tissues and spinal canal: No prevertebral fluid or swelling. No visible canal hematoma. Disc levels: Moderate to severe disc space height loss and osteophytosis, worst from C4-C7. Upper chest: Negative. Other: None. IMPRESSION: 1. No acute intracranial pathology. Small-vessel white matter disease and unchanged right frontal encephalomalacia. 2. No fracture or static subluxation of the cervical spine. 3. Moderate to severe cervical disc degenerative disease. Electronically Signed   By: Jearld Lesch M.D.   On: 11/28/2022 13:44   DG Chest Portable 1 View  Result Date: 11/28/2022 CLINICAL DATA:  Shortness of breath EXAM: PORTABLE CHEST 1 VIEW COMPARISON:  06/23/2022 FINDINGS: Hyperinflation. Bilateral apical pleural thickening. No consolidation, pneumothorax, effusion or edema. Stable cardiopericardial silhouette. Calcified aorta. Left upper chest battery pack for pacemaker leads along the right side of the heart. Overlapping cardiac leads. Elevated left humeral head. Please correlate for a rotator cuff tear. IMPRESSION: Pacemaker.  Hyperinflation.  No acute cardiopulmonary disease. Electronically Signed   By: Karen Kays M.D.   On: 11/28/2022 13:07    Procedures Procedures    Medications Ordered in ED Medications  acetaminophen (TYLENOL) tablet 500 mg (has no administration in time range)  aspirin EC tablet 81 mg (has no administration in time range)   carbidopa-levodopa (SINEMET IR) 25-100 MG per tablet immediate release 1 tablet (has no administration in time range)  escitalopram (LEXAPRO) tablet 10 mg (has no administration in time range)  ezetimibe (ZETIA) tablet 10 mg (has no administration in time range)  fluticasone furoate-vilanterol (BREO ELLIPTA) 100-25 MCG/ACT 1 puff (has no administration in time range)    And  umeclidinium bromide (INCRUSE ELLIPTA) 62.5 MCG/ACT 1 puff (has no administration in time range)    ED Course/ Medical Decision Making/ A&P Clinical Course as of 11/28/22 1712  Sat Nov 28, 2022  1710 No evidence of urinary tract infection on UA.  No leukocytosis or significant anemia on CBC.  CMP shows no underlying electrolyte disturbance or significant renal impairment.  No evidence of acute intracranial abnormalities on CT head.  EKG is without evidence of dysrhythmia.  No clear underlying etiology for the patient generalized weakness however given that she is unable to ambulate under her own power we will obtain a PT/case  management evaluation here in the emergency department as her husband cannot care for her in this condition at home.  Transfer of care to oncoming team pending PT/case management, reevaluation and disposition.  Home medications have been ordered here in the ED [MP]    Clinical Course User Index [MP] Royanne Foots, DO                                 Medical Decision Making 77 year old female with history as above presenting for generalized weakness.  3 days of generalized weakness at home under the care of her husband.  Husband voices concern for potential urinary tract infection as underlying etiology.  Other differential diagnoses to consider include intracranial hemorrhage given potential head strike at home, dysrhythmia, pneumonia, electrolyte imbalance or anemia.  Will obtain laboratory workup including CMP, CBC, CT head without contrast, urinalysis and chest x-ray.  Most likely etiology at this  time would be urinary tract infection.  Will also obtain COVID/influenza/RSV swab although patient has no reported URI symptoms or fevers  Amount and/or Complexity of Data Reviewed Labs: ordered. Radiology: ordered.  Risk OTC drugs. Prescription drug management.           Final Clinical Impression(s) / ED Diagnoses Final diagnoses:  None    Rx / DC Orders ED Discharge Orders     None         Royanne Foots, DO 11/28/22 1712

## 2022-11-28 NOTE — ED Triage Notes (Signed)
Pt BIB [facility] from home d/t generalized weakness and suspected UTI by spouse. Pt's spouse reported pt has been urinating more frequently w foul smell. Pt also has intermittent confusion. Pt has parkinson's. Pt is ambulatory at baseline.   BP 138/70 PULSE 90  RR 18  SPO2 97% RA

## 2022-11-29 NOTE — Progress Notes (Signed)
CSW spoke with patient's husband Link Snuffer to discuss discharge plan. CSW informed Link Snuffer that PT had recommended home health services at discharge but Link Snuffer states he cannot care for patient at home with home health services. Link Snuffer states he does not have any family or friends that can assist with care for patient. CSW througholy explained possible barriers to discharge specifically patient's insurance being [employer]. Eddie requesting attempt to obtain bed offers and insurance authorization for short term rehab.  CSW spoke with PT to request recommendation be changed to SNF - PT agreeable.  CSW will complete FL2 and fax patient's clinicals out to obtain bed offers.  Edwin Dada, MSW, LCSW Transitions of Care  Clinical Social Worker II [phone]

## 2022-11-29 NOTE — Care Management (Addendum)
Spoke to Mr Chesmore in regards to Home health assistance . He indeed would like home health. Messged with Provider and he will order PT OT Aide and CSW. Spoke with [facility] ( patient had them previously), [facility] and they accepted. Patient can be DC to home. 1330 Spoke again with Mr Steinmann. He  states his wife is not comfortable going home. He feels he cannot take care of her the way she currently is. Conversed with CSW she will speak to them regarding other options.

## 2022-11-29 NOTE — Evaluation (Addendum)
Addendum 13:55 - updated recs from [facility] to SNF as TOC team reports husband cannot provide level of care pt currently needs.  Mandy Durham, PT, DPT Acute Rehabilitation Services  Office: [phone]  Physical Therapy Evaluation Patient Details Name: Mandy Durham MRN: [id number] DOB: 11-Nov-1945 Today's Date: 11/29/2022  History of Present Illness  Pt is a 77 y.o. female who presented 11/28/22 with generalized weakness and concern for UTI. PMHx: HFpEF, Parkinson's disease, HTN, HLD, orthostatic hypotension/autonomic dysfunction, h/o TBI, SAH, recent admission 03/2022 for COVID   Clinical Impression  Pt presents with condition above and deficits mentioned below, see PT Problem List. PTA, she reports she was mod I using a RW, living with her husband in a 1-level house with 4 STE. Unsure if pt is at her baseline cognition or not currently, but she does follow simple cues and is oriented to self, location, and situation at least. Currently, pt demonstrates deficits in generalized strength, activity tolerance, balance, and power and is at risk for falls. She required modA to transfer to stand and minA to ambulate bedroom distance with a RW today. She required minA to transition supine to sit but maxA to roll and return to supine, but she may have been able to roll and return to supine with increased independence if provided more time to initiate herself. Per TOC team, husband unable to provide the level of care pt needs, thus recommend short-term inpatient rehab, < 3 hours/day. Will continue to follow acutely.        If plan is discharge home, recommend the following: A lot of help with walking and/or transfers;A lot of help with bathing/dressing/bathroom;Assistance with cooking/housework;Direct supervision/assist for medications management;Direct supervision/assist for financial management;Assist for transportation;Help with stairs or ramp for entrance   Can travel by private vehicle   Yes     Equipment Recommendations None recommended by PT (provided pt does indeed have a RW, 3in1, shower chair, and w/c)  Recommendations for Other Services  OT consult    Functional Status Assessment Patient has had a recent decline in their functional status and demonstrates the ability to make significant improvements in function in a reasonable and predictable amount of time.     Precautions / Restrictions Precautions Precautions: Fall Precaution Comments: Parkinson's; stuttered speech Restrictions Weight Bearing Restrictions: No      Mobility  Bed Mobility Overal bed mobility: Needs Assistance Bed Mobility: Supine to Sit, Sit to Supine, Rolling Rolling: Max assist, Used rails   Supine to sit: Min assist, HOB elevated Sit to supine: Max assist, HOB elevated   General bed mobility comments: Pt needed maxA to initiate rolling and grabbing rails and to lay back down to supine, likely could have completed with less assistance if given more time to initiate herself. MinA to pivot hips to sit up L EOB.    Transfers Overall transfer level: Needs assistance Equipment used: Rolling walker (2 wheels) Transfers: Sit to/from Stand Sit to Stand: Mod assist           General transfer comment: ModA to shift weight anteriorly and power up to stand from EOB to RW with pt pushing up from bed surface.    Ambulation/Gait Ambulation/Gait assistance: Min assist Gait Distance (Feet): 20 Feet Assistive device: Rolling walker (2 wheels) Gait Pattern/deviations: Step-through pattern, Decreased step length - right, Decreased step length - left, Decreased stride length, Shuffle, Trunk flexed, Narrow base of support Gait velocity: reduced Gait velocity interpretation: <1.31 ft/sec, indicative of household ambulator   General Gait  Details: Pt ambulates slowly with a flexed posture and narrow, shuffling steps. MinA for balance  Stairs            Wheelchair Mobility     Tilt Bed     Modified Rankin (Stroke Patients Only) Modified Rankin (Stroke Patients Only) Pre-Morbid Rankin Score: Moderate disability Modified Rankin: Moderately severe disability     Balance Overall balance assessment: Needs assistance Sitting-balance support: No upper extremity supported, Feet supported Sitting balance-Leahy Scale: Fair     Standing balance support: Bilateral upper extremity supported, During functional activity, Reliant on assistive device for balance Standing balance-Leahy Scale: Poor Standing balance comment: Reliant on UE support                             Pertinent Vitals/Pain Pain Assessment Pain Assessment: Faces Faces Pain Scale: Hurts a little bit Pain Location: generalized Pain Descriptors / Indicators: Discomfort, Grimacing Pain Intervention(s): Limited activity within patient's tolerance, Monitored during session, Repositioned    Home Living Family/patient expects to be discharged to:: Private residence Living Arrangements: Spouse/significant other Available Help at Discharge: Family;Available 24 hours/day Type of Home: House Home Access: Stairs to enter Entrance Stairs-Rails: Right Entrance Stairs-Number of Steps: 4   Home Layout: One level Home Equipment: Shower seat;Rolling Walker (2 wheels);BSC/3in1;Wheelchair - manual Additional Comments: some info is contradictory to prior entries from March 2024    Prior Function Prior Level of Function : Needs assist             Mobility Comments: Mod I using a RW, reportedly was progressing away from needing it with HHPT ADLs Comments: Per pt, husband intermittently helps with pericare but otherwise pt typically dresses herself, but if she is not doing well he does assist her     Extremity/Trunk Assessment   Upper Extremity Assessment Upper Extremity Assessment: Defer to OT evaluation    Lower Extremity Assessment Lower Extremity Assessment: Generalized weakness    Cervical /  Trunk Assessment Cervical / Trunk Assessment: Kyphotic  Communication   Communication Communication: Difficulty communicating thoughts/reduced clarity of speech  Cognition Arousal: Alert Behavior During Therapy: WFL for tasks assessed/performed Overall Cognitive Status: No family/caregiver present to determine baseline cognitive functioning                                 General Comments: Slow to respond with stuttered speech and hx of Parkinson's; oriented to self, location, and situation, did not ask date; follows simple commands with increased time        General Comments      Exercises     Assessment/Plan    PT Assessment Patient needs continued PT services  PT Problem List Decreased strength;Decreased activity tolerance;Decreased balance;Decreased mobility;Decreased cognition       PT Treatment Interventions DME instruction;Gait training;Stair training;Functional mobility training;Therapeutic activities;Therapeutic exercise;Balance training;Neuromuscular re-education;Patient/family education;Cognitive remediation    PT Goals (Current goals can be found in the Care Plan section)  Acute Rehab PT Goals Patient Stated Goal: to improve PT Goal Formulation: With patient Time For Goal Achievement: 12/13/22 Potential to Achieve Goals: Good    Frequency Min 1X/week     Co-evaluation               AM-PAC PT "6 Clicks" Mobility  Outcome Measure Help needed turning from your back to your side while in a flat bed without using bedrails?: A Lot Help  needed moving from lying on your back to sitting on the side of a flat bed without using bedrails?: A Little Help needed moving to and from a bed to a chair (including a wheelchair)?: A Little Help needed standing up from a chair using your arms (e.g., wheelchair or bedside chair)?: A Lot Help needed to walk in hospital room?: A Little Help needed climbing 3-5 steps with a railing? : A Lot 6 Click Score:  15    End of Session Equipment Utilized During Treatment: Gait belt Activity Tolerance: Patient tolerated treatment well Patient left: in bed;with call bell/phone within reach;with bed alarm set Nurse Communication: Mobility status PT Visit Diagnosis: Unsteadiness on feet (R26.81);Other abnormalities of gait and mobility (R26.89);Muscle weakness (generalized) (M62.81);Difficulty in walking, not elsewhere classified (R26.2);History of falling (Z91.81)    Time: 2952-8413 PT Time Calculation (min) (ACUTE ONLY): 45 min   Charges:   PT Evaluation $PT Eval Moderate Complexity: 1 Mod PT Treatments $Therapeutic Activity: 23-37 mins PT General Charges $$ ACUTE PT VISIT: 1 Visit         Mandy Durham, PT, DPT Acute Rehabilitation Services  Office: [phone]   Bettina Gavia 11/29/2022, 1:54 PM

## 2022-11-29 NOTE — ED Provider Notes (Signed)
Emergency Medicine Observation Re-evaluation Note  Mandy Durham is a 77 y.o. female, seen on rounds today.  Pt initially presented to the ED for complaints of Weakness Currently, the patient is sitting in bed.  Physical Exam  BP (!) 152/81 (BP Location: Left Arm)   Pulse 80   Temp 98.5 F (36.9 C) (Oral)   Resp 18   SpO2 98%  Physical Exam General: Awake, alert, nondistressed Cardiac: Extremities well-perfused Lungs: Breathing is unlabored Psych: No agitation  ED Course / MDM  EKG:EKG Interpretation Date/Time:  Saturday November 28 2022 13:17:22 EDT Ventricular Rate:  197 PR Interval:    QRS Duration:  161 QT Interval:  382 QTC Calculation: 633 R Axis:   270  Text Interpretation: VENTRICULAR PACED RHYTHM Artifact in lead(s) III aVL aVF V1 V2 V3 V4 V5 V6 Confirmed by Gwyneth Sprout ([id number]) on 11/28/2022 4:21:14 PM  I have reviewed the labs performed to date as well as medications administered while in observation.  Recent changes in the last 24 hours include presentation to the emergency department yesterday for generalized weakness.  Workup was unremarkable.  PT evaluation and social work consult have been ordered.  Plan  Current plan is for PT evaluation and social work consult.    Gloris Manchester, MD 11/29/22 1126

## 2022-11-29 NOTE — [place] FL2 (Signed)
[place] MEDICAID FL2 LEVEL OF CARE FORM     IDENTIFICATION  Patient Name: Mandy Durham Birthdate: 1945-03-28 Sex: female Admission Date (Current Location): 11/28/2022  [facility] [place] and [place] Number:  Producer, television/film/video and Address:  [facility], [address], [place], [place] [address]      Provider Number: [id number]  Attending Physician Name and Address:  No att. providers found  Relative Name and Phone Number:       Current Level of Care: Hospital Recommended Level of Care: Skilled Nursing Facility Prior Approval Number:    Date Approved/Denied:   PASRR Number: [id number]  Discharge Plan: SNF    Current Diagnoses: Patient Active Problem List   Diagnosis Date Noted   Cerebrovascular accident (CVA) (HCC) 10/29/2022   Diabetes (HCC) 09/21/2022   Ventricular tachycardia (HCC) 05/24/2022   Encephalopathy, metabolic 05/24/2022   Malnutrition of moderate degree 03/20/2022   COVID-19 03/19/2022   Fluency disorder associated with underlying disease 12/18/2021   Diplopia 12/18/2021   Cervical dystonia 12/18/2021   Dyskinesia 12/18/2021   Neuropathy involving both lower extremities 06/09/2021   Complaints of weakness of lower extremity 06/09/2021   Abnormal brain MRI 06/09/2021   Neck muscle weakness 06/02/2021   Diastolic CHF (HCC) 05/27/2021   Kyphosis due to degeneration of spine 05/05/2021   Drug-induced orofacial dyskinesia 05/05/2021   Hyperkinesis 05/05/2021   Traumatic intracerebral hemorrhage with loss of consciousness of 31 minutes to 59 minutes (HCC) 05/05/2021   Atypical parkinsonism 09/07/2018   Functional dyspepsia 09/08/2017   Slow transit constipation 09/08/2017   S/P placement of cardiac pacemaker    Mobitz II 02/20/2016   Nystagmus, end-position 02/12/2016   Dizziness and giddiness 02/12/2016   Flapping tremor 02/12/2016   Chronic fatigue 10/28/2015   PCP NOTES >>>> 02/13/2015   TBI (traumatic brain injury) (HCC)  01/28/2015   Essential hypertension    Acute post-traumatic headache, not intractable    Vertigo due to brain injury (HCC)    SAH (subarachnoid hemorrhage) (HCC) 01/21/2015   Annual physical exam 09/07/2014   Orthostatic hypotension 08/30/2014   Acromioclavicular arthrosis 03/21/2014   Allergic rhinitis 02/27/2014   SI (sacroiliac) joint dysfunction 02/02/2014   Gastrocnemius strain, left 12/11/2013   Back pain 08/22/2013   LBBB (left bundle branch block) 05/13/2013   Borderline diabetes    Insomnia 01/12/2008   HLD (hyperlipidemia) 02/04/2007   Anxiety 10/12/2006   Osteoarthritis 10/12/2006    Orientation RESPIRATION BLADDER Height & Weight     Self, Time, Situation, Place  Normal Continent, External catheter Weight:   Height:     BEHAVIORAL SYMPTOMS/MOOD NEUROLOGICAL BOWEL NUTRITION STATUS      Continent Diet (Regular)  AMBULATORY STATUS COMMUNICATION OF NEEDS Skin   Extensive Assist Verbally Normal                       Personal Care Assistance Level of Assistance  Bathing, Feeding, Dressing Bathing Assistance: Limited assistance Feeding assistance: Independent Dressing Assistance: Limited assistance     Functional Limitations Info  Sight, Speech, Hearing Sight Info: Adequate Hearing Info: Adequate Speech Info: Adequate    SPECIAL CARE FACTORS FREQUENCY  PT (By licensed PT), OT (By licensed OT)     PT Frequency: 5x weekly OT Frequency: 5x weekly            Contractures Contractures Info: Not present    Additional Factors Info  Code Status, Allergies Code Status Info: Full Code Allergies Info: No  known allergies           Current Medications (11/29/2022):  This is the current hospital active medication list Current Facility-Administered Medications  Medication Dose Route Frequency Provider Last Rate Last Admin   acetaminophen (TYLENOL) tablet 500 mg  500 mg Oral Q6H PRN Estelle Lucky A, DO   500 mg at 11/28/22 2201   aspirin EC tablet 81 mg   81 mg Oral Daily Estelle Lilley A, DO   81 mg at 11/29/22 1020   carbidopa-levodopa (SINEMET IR) 25-100 MG per tablet immediate release 1 tablet  1 tablet Oral TID Estelle Keaghan A, DO   1 tablet at 11/29/22 1020   escitalopram (LEXAPRO) tablet 10 mg  10 mg Oral Daily Estelle Eliz A, DO   10 mg at 11/29/22 1020   ezetimibe (ZETIA) tablet 10 mg  10 mg Oral Daily Estelle Dorine A, DO   10 mg at 11/29/22 1020   fluticasone furoate-vilanterol (BREO ELLIPTA) 100-25 MCG/ACT 1 puff  1 puff Inhalation Daily Estelle Joliene A, DO   1 puff at 11/29/22 1023   And   umeclidinium bromide (INCRUSE ELLIPTA) 62.5 MCG/ACT 1 puff  1 puff Inhalation Daily Estelle Tashawna A, DO   1 puff at 11/29/22 1024   Current Outpatient Medications  Medication Sig Dispense Refill   ACETAMINOPHEN PO Take 500 mg by mouth every 6 (six) hours as needed for moderate pain. Patient taking twice a day     aspirin EC 81 MG tablet Take 81 mg by mouth daily. Swallow whole.     carbidopa-levodopa (SINEMET IR) 25-100 MG tablet Take 1 tablet by mouth in the AM, 1/2 tablet at noon, 1/2 tablet in the afternoon and 1 table at bedtime 90 tablet 12   Cholecalciferol (VITAMIN D PO) Take 1 tablet by mouth 2 (two) times a week. Vitamin D (Patient not taking: Reported on 11/04/2022)     escitalopram (LEXAPRO) 10 MG tablet Take 1 tablet (10 mg total) by mouth daily. 90 tablet 1   ezetimibe (ZETIA) 10 MG tablet Take 1 tablet (10 mg total) by mouth daily. 90 tablet 1   Fluticasone-Umeclidin-Vilant (TRELEGY ELLIPTA) 100-62.5-25 MCG/ACT AEPB Inhale 1 puff into the lungs daily. (Patient not taking: Reported on 11/04/2022) 14 each 0   lactose free nutrition (BOOST PLUS) LIQD Take 237 mLs by mouth 2 (two) times daily between meals.  0   Menthol, Topical Analgesic, (BIOFREEZE ROLL-ON) 4 % GEL Apply topically as needed (for neck / shoulder pain). Use 3 or 4 times per day     nitroGLYCERIN (NITROSTAT) 0.4 MG SL tablet Place 1 tablet (0.4 mg total) under the tongue  every 5 (five) minutes x 3 doses as needed for chest pain. 25 tablet 1   tetrabenazine (XENAZINE) 12.5 MG tablet Take 1 tablet (12.5 mg total) by mouth 3 (three) times daily. (Patient not taking: Reported on 11/04/2022) 90 tablet 5     Discharge Medications: Please see discharge summary for a list of discharge medications.  Relevant Imaging Results:  Relevant Lab Results:   Additional Information SSN [id number]  Inis Sizer, LCSW

## 2022-11-30 LAB — HUNTINGTON DISEASE REPEAT EXP

## 2022-11-30 NOTE — ED Notes (Signed)
Pt received for care at 1900.  Quietly resting in bed; respirations even and unlabored with no distress noted.  Bed in lowest position, wheels locked.

## 2022-11-30 NOTE — Progress Notes (Signed)
CSW received a call from [facility] notifying CSW that patient is out of network for [facility] and patient would have a daily copay. CSW spoke with patients husband at bedside who stated he couldn't pay the copay and would like [facility] instead. CSW updated [employer]. Nikki in admissions is willing to accept patient.

## 2022-11-30 NOTE — Discharge Planning (Signed)
Licensed Clinical Social Worker is seeking post-discharge placement for this patient at the following level of care: skilled nursing facility

## 2022-11-30 NOTE — Evaluation (Signed)
Occupational Therapy Evaluation Patient Details Name: Mandy Durham MRN: [id number] DOB: [birth year] Today's Date: 11/30/2022   History of Present Illness Pt is a 77 y.o. female who presented 11/28/22 with generalized weakness and concern for UTI. PMHx: HFpEF, Parkinson's disease, HTN, HLD, orthostatic hypotension/autonomic dysfunction, h/o TBI, SAH, recent admission 03/2022 for COVID   Clinical Impression   Pt currently at max assist level for simulated toileting tasks and LB selfcare sit to stand.  Moderate posterior lean is noted in sitting and with standing without use of an assistive device.  Pt has history of Parkinson's and was able to normally complete toileting, bathing, and dressing tasks with use of DME and occasional assist from her spouse, depending on how she was feedling.  Currently, feel she is significantly more impaired in ADL independence compared to baseline and will benefit from acute care OT to help progress back to a level her spouse can safely assist.  Feel patient will benefit from continued inpatient follow up therapy, <3 hours/day post acute stay.          If plan is discharge home, recommend the following: A lot of help with walking and/or transfers;A lot of help with bathing/dressing/bathroom;Assist for transportation;Direct supervision/assist for financial management;Assistance with cooking/housework;Supervision due to cognitive status;Help with stairs or ramp for entrance;Direct supervision/assist for medications management    Functional Status Assessment  Patient has had a recent decline in their functional status and demonstrates the ability to make significant improvements in function in a reasonable and predictable amount of time.  Equipment Recommendations  Other (comment) (TBD next venue of care)       Precautions / Restrictions Precautions Precautions: Fall Precaution Comments: Parkinson's; stuttered speech Restrictions Weight Bearing Restrictions: No       Mobility Bed Mobility Overal bed mobility: Needs Assistance Bed Mobility: Supine to Sit, Sit to Supine     Supine to sit: Mod assist Sit to supine: Max assist, HOB elevated   General bed mobility comments: Pt needed mod assist for bringing trunk up to sitting, LEs around to the side of the bed, and then to scoot out to the EOB.  Max assist for bringing trunk to sidelying and bringing LEs back in the bed when transferring back to supine.    Transfers Overall transfer level: Needs assistance Equipment used: None Transfers: Sit to/from Stand, Bed to chair/wheelchair/BSC Sit to Stand: Max assist     Step pivot transfers: Max assist     General transfer comment: Pt with severe posterior lean with standing and therapist in front of her.  Decreased ability to step forward adequate step length without use of an assistive device.      Balance Overall balance assessment: Needs assistance Sitting-balance support: No upper extremity supported, Feet supported Sitting balance-Leahy Scale: Poor Sitting balance - Comments: Posterior lean and LOB   Standing balance support: Bilateral upper extremity supported, During functional activity, Reliant on assistive device for balance Standing balance-Leahy Scale: Zero Standing balance comment: Severe posterior lean                           ADL either performed or assessed with clinical judgement   ADL Overall ADL's : Needs assistance/impaired Eating/Feeding: Supervision/ safety;Sitting Eating/Feeding Details (indicate cue type and reason): simulated Grooming: Wash/dry hands;Wash/dry face;Oral care;Sitting;Set up Grooming Details (indicate cue type and reason): simulated Upper Body Bathing: Supervision/ safety;Sitting Upper Body Bathing Details (indicate cue type and reason): simulated Lower Body Bathing: Maximal assistance;Sit  to/from stand Lower Body Bathing Details (indicate cue type and reason): simulated Upper Body  Dressing : Sitting;Minimal assistance Upper Body Dressing Details (indicate cue type and reason): simulated Lower Body Dressing: Maximal assistance;Sit to/from stand Lower Body Dressing Details (indicate cue type and reason): simulated Toilet Transfer: Maximal assistance;Stand-pivot Toilet Transfer Details (indicate cue type and reason): simulated Toileting- Clothing Manipulation and Hygiene: Moderate assistance;Sit to/from stand Toileting - Clothing Manipulation Details (indicate cue type and reason): simulated     Functional mobility during ADLs: Maximal assistance (stand pivot transfer) General ADL Comments: Pt with severe posterior lean in sitting and in standing.  Min assist for sitting balance dynamically when attempting MMT at EOB.  Max assist to maintain LB bathing or dressing secondary to posterior lean.     Vision Baseline Vision/History: 0 No visual deficits Ability to See in Adequate Light: 0 Adequate Patient Visual Report: No change from baseline Vision Assessment?: No apparent visual deficits     Perception Perception: Not tested       Praxis Praxis: Not tested       Pertinent Vitals/Pain Pain Assessment Pain Assessment: Faces Faces Pain Scale: No hurt     Extremity/Trunk Assessment Upper Extremity Assessment Upper Extremity Assessment: LUE deficits/detail LUE Deficits / Details: Limitations in shoulder flexion with AAROM approximately 0-80 degrees with crepitus noted.  AROM elbow flexion/extension WFLs with grip 3+/5. Slight tremor noted at rest in the right and left hand       Cervical / Trunk Assessment Cervical / Trunk Assessment: Kyphotic   Communication Communication Communication: Difficulty communicating thoughts/reduced clarity of speech   Cognition Arousal: Alert Behavior During Therapy: WFL for tasks assessed/performed Overall Cognitive Status: History of cognitive impairments - at baseline                                  General Comments: Pt with severe stuttering speech which has worsened over the past few months per her spouses report.  Pt not oriented to day of the week or year but was oriented to month.                Home Living Family/patient expects to be discharged to:: Private residence Living Arrangements: Spouse/significant other Available Help at Discharge: Family;Available 24 hours/day Type of Home: House Home Access: Stairs to enter [employer] of Steps: 4 Entrance Stairs-Rails: Right Home Layout: One level     Bathroom Shower/Tub: Walk-in shower;Sponge bathes at baseline (fear of falling)         Home Equipment: Shower seat;Rolling Environmental consultant (2 wheels);BSC/3in1;Wheelchair - manual;Rollator (4 wheels);Grab bars - tub/shower   Additional Comments: some info is contradictory to prior entries from March 2024      Prior Functioning/Environment Prior Level of Function : Needs assist             Mobility Comments: Mod I using a RW in the house and rollator outside recently, reportedly was progressing away from needing it with HHPT ADLs Comments: Per pt, husband intermittently helps with pericare but otherwise pt typically dresses herself, but if she is not doing well he does assist her with putting on PJs at night.        OT Problem List: Decreased range of motion;Pain;Decreased coordination;Impaired UE functional use;Decreased knowledge of use of DME or AE;Decreased strength;Impaired balance (sitting and/or standing)      OT Treatment/Interventions: Self-care/ADL training;Balance training;Therapeutic activities;DME and/or AE instruction;Therapeutic exercise;Patient/family education  OT Goals(Current goals can be found in the care plan section) Acute Rehab OT Goals Patient Stated Goal: Pt's spouse wants her to go to rehab as she needs more assist currently than what he can provide. OT Goal Formulation: With patient Time For Goal Achievement: 12/14/22 Potential to  Achieve Goals: Good  OT Frequency: Min 1X/week       AM-PAC OT "6 Clicks" Daily Activity     Outcome Measure Help from another person eating meals?: A Little Help from another person taking care of personal grooming?: A Little Help from another person toileting, which includes using toliet, bedpan, or urinal?: Total Help from another person bathing (including washing, rinsing, drying)?: Total Help from another person to put on and taking off regular upper body clothing?: A Lot Help from another person to put on and taking off regular lower body clothing?: Total 6 Click Score: 11   End of Session Equipment Utilized During Treatment: Gait belt Nurse Communication: Mobility status  Activity Tolerance: Patient tolerated treatment well Patient left: in bed;with call bell/phone within reach;with bed alarm set  OT Visit Diagnosis: Unsteadiness on feet (R26.81);Other abnormalities of gait and mobility (R26.89);Muscle weakness (generalized) (M62.81)                Time: 1610-9604 OT Time Calculation (min): 32 min Charges:  OT General Charges $OT Visit: 1 Visit OT Evaluation $OT Eval Moderate Complexity: 1 Mod OT Treatments $Self Care/Home Management : 8-22 mins Perrin Maltese, OTR/L Acute Rehabilitation Services  Office [phone] 11/30/2022

## 2022-11-30 NOTE — ED Provider Notes (Signed)
Emergency Medicine Observation Re-evaluation Note  Mandy Durham is a 77 y.o. female, seen on rounds today.  Pt initially presented to the ED for complaints of Weakness Currently, the patient is asleep, resting comfortably.  Physical Exam  BP 128/68 (BP Location: Right Arm)   Pulse 76   Temp 97.8 F (36.6 C) (Oral)   Resp 16   Ht 5\' 5"  (1.651 m)   Wt 51.3 kg   SpO2 98%   BMI 18.82 kg/m  Physical Exam General: NAD Cardiac: well perfused Lungs: even and unlabored respirations Psych: no agitation  ED Course / MDM  EKG:EKG Interpretation Date/Time:  Saturday November 28 2022 13:17:22 EDT Ventricular Rate:  197 PR Interval:    QRS Duration:  161 QT Interval:  382 QTC Calculation: 633 R Axis:   270  Text Interpretation: VENTRICULAR PACED RHYTHM Artifact in lead(s) III aVL aVF V1 V2 V3 V4 V5 V6 Confirmed by Gwyneth Sprout ([id number]) on 11/28/2022 4:21:14 PM  I have reviewed the labs performed to date as well as medications administered while in observation.  Recent changes in the last 24 hours include none, pt pending placement.  Plan  Current plan is for SW for placement.    Ernie Avena, MD 11/30/22 [phone]

## 2022-11-30 NOTE — ED Notes (Signed)
Pt clean and dry at this time.  Provided with warm blanket.

## 2022-11-30 NOTE — Progress Notes (Signed)
Patient is oriented to self only. CSW spoke with patients husband Carmesha Gambone and presented the bed offers. Patients husband would like to select [facility]. CSW confirmed the bed offer with [facility] with admissions. CSW also contacted Tammy with [employer] and started the insurance authorization.

## 2022-11-30 NOTE — Progress Notes (Signed)
Patients insurance authorization is still pending.

## 2022-11-30 NOTE — ED Notes (Signed)
Pt refused breakfast, but ate applesauce with her meds

## 2022-12-01 NOTE — ED Provider Notes (Signed)
Emergency Medicine Observation Re-evaluation Note  Mandy Durham is a 77 y.o. female, seen on rounds today.  Pt initially presented to the ED for complaints of Weakness Currently, the patient is resting.  Physical Exam  BP (!) 107/59 (BP Location: Right Arm)   Pulse 95   Temp 97.9 F (36.6 C) (Oral)   Resp 16   Ht 5\' 5"  (1.651 m)   Wt 51.3 kg   SpO2 96%   BMI 18.82 kg/m  Physical Exam General: NAD   ED Course / MDM  EKG:EKG Interpretation Date/Time:  Saturday November 28 2022 13:17:22 EDT Ventricular Rate:  197 PR Interval:    QRS Duration:  161 QT Interval:  382 QTC Calculation: 633 R Axis:   270  Text Interpretation: VENTRICULAR PACED RHYTHM Artifact in lead(s) III aVL aVF V1 V2 V3 V4 V5 V6 Confirmed by Gwyneth Sprout ([id number]) on 11/28/2022 4:21:14 PM  I have reviewed the labs performed to date as well as medications administered while in observation.  Recent changes in the last 24 hours include no acute events reported.  Plan  Current plan is for placement.    Mandy Fines, MD 12/01/22 [phone]

## 2022-12-01 NOTE — Progress Notes (Signed)
Physical Therapy Treatment Patient Details Name: Mandy Durham MRN: [id number] DOB: [birth year] Today's Date: 12/01/2022   History of Present Illness Pt is a 77 y.o. female who presented 11/28/22 with generalized weakness and concern for UTI. PMHx: HFpEF, Parkinson's disease, HTN, HLD, orthostatic hypotension/autonomic dysfunction, h/o TBI, SAH, recent admission 03/2022 for COVID    PT Comments  Patient progressing with mobility this session with increased distance with ambulation and improvements with sit to stand with practice and cues.  She is fearful of falling, but benefits from experience to gain confidence.  Continue to feel she remains appropriate for inpatient rehab (< 3 hours/day) prior to d/c home.     If plan is discharge home, recommend the following: A lot of help with walking and/or transfers;A lot of help with bathing/dressing/bathroom;Assistance with cooking/housework;Direct supervision/assist for medications management;Direct supervision/assist for financial management;Assist for transportation;Help with stairs or ramp for entrance   Can travel by private vehicle     Yes  Equipment Recommendations  None recommended by PT    Recommendations for Other Services       Precautions / Restrictions Precautions Precautions: Fall Precaution Comments: Parkinson's; stuttered speech     Mobility  Bed Mobility Overal bed mobility: Needs Assistance Bed Mobility: Rolling, Sidelying to Sit Rolling: Supervision, Used rails Sidelying to sit: Min assist       General bed mobility comments: increased time, cues for technique, assist to scoot hips pt able to lift upright using rail wtih both hands with increased time    Transfers Overall transfer level: Needs assistance Equipment used: Rolling walker (2 wheels) Transfers: Sit to/from Stand Sit to Stand: Max assist, Mod assist, Min assist           General transfer comment: initially max A for lifting help and anterior  weight shift to RW, practiced sit<>stand x 4 with improved mechanics and mod to max cues with less assist needed    Ambulation/Gait Ambulation/Gait assistance: Min assist Gait Distance (Feet): 60 Feet Assistive device: Rolling walker (2 wheels) Gait Pattern/deviations: Step-to pattern, Decreased dorsiflexion - right, Decreased dorsiflexion - left, Decreased stride length, Trunk flexed, Shuffle       General Gait Details: limited foot clearance hitting toes first despite cues for heels first, mildly flexed at hips and assist for keeping hips forward, cues for staying inside walker   Stairs             Wheelchair Mobility     Tilt Bed    Modified Rankin (Stroke Patients Only)       Balance Overall balance assessment: Needs assistance   Sitting balance-Leahy Scale: Fair Sitting balance - Comments: initial posterior lean, improved after mobility sitting without UE support   Standing balance support: Bilateral upper extremity supported, Reliant on assistive device for balance Standing balance-Leahy Scale: Poor Standing balance comment: UE support for balance                            Cognition Arousal: Alert Behavior During Therapy: WFL for tasks assessed/performed Overall Cognitive Status: History of cognitive impairments - at baseline                                 General Comments: oriented to month, person, place, intermittent stuttering        Exercises      General Comments General comments (skin integrity, edema, etc.): reports  wound on bottom of R foot with toes lifting from floor in standing; spouse present throughout and supportive, BP measured supine, sitting & standing noted SBP from 150's supine, 140's sitting and 130's standing with minimal report of symptoms      Pertinent Vitals/Pain Pain Assessment Pain Assessment: No/denies pain    Home Living                          Prior Function            PT  Goals (current goals can now be found in the care plan section) Progress towards PT goals: Progressing toward goals    Frequency    Min 1X/week      PT Plan      Co-evaluation              AM-PAC PT "6 Clicks" Mobility   Outcome Measure  Help needed turning from your back to your side while in a flat bed without using bedrails?: A Lot Help needed moving from lying on your back to sitting on the side of a flat bed without using bedrails?: A Lot Help needed moving to and from a bed to a chair (including a wheelchair)?: A Lot Help needed standing up from a chair using your arms (e.g., wheelchair or bedside chair)?: A Lot Help needed to walk in hospital room?: A Little Help needed climbing 3-5 steps with a railing? : Total 6 Click Score: 12    End of Session Equipment Utilized During Treatment: Gait belt Activity Tolerance: Patient tolerated treatment well Patient left: in bed;with call bell/phone within reach;with family/visitor present   PT Visit Diagnosis: Other abnormalities of gait and mobility (R26.89);Muscle weakness (generalized) (M62.81);History of falling (Z91.81)     Time: 8295-6213 PT Time Calculation (min) (ACUTE ONLY): 29 min  Charges:    $Gait Training: 8-22 mins $Therapeutic Activity: 8-22 mins PT General Charges $$ ACUTE PT VISIT: 1 Visit                     Mandy Durham, PT Acute Rehabilitation Services Office:[phone] 12/01/2022    [name] 12/01/2022, 5:31 PM

## 2022-12-01 NOTE — Progress Notes (Signed)
CSW contacted patients husband to notify him that patients insurance authorization is still pending.

## 2022-12-01 NOTE — Progress Notes (Signed)
CSW contacted [employer] and spoke with Junious Dresser. Patients insurance authorization is still pending.

## 2022-12-01 NOTE — ED Notes (Signed)
Patient's at baseline has tremors

## 2022-12-01 NOTE — Progress Notes (Signed)
CSW contacted [employer] and spoke with Junious Dresser. CSW was told patients insurance authorization is still pending. CSW asked when a decision would be made. CSW was told no timeframe could be given with the amount of insurance authorizations they received yesterday.

## 2022-12-01 NOTE — ED Notes (Addendum)
Pt tearful in room upon rounding.  Pt stating she needs to clean herself and needs help doing so.  This RN reassured pt and told her he would help clean her and get her a new gown and sheets.  Pt cleaned and changed.  New gown placed on pt and pt provided with new linens.

## 2022-12-01 NOTE — Progress Notes (Signed)
Patients insurance authorization for SNF is still pending.

## 2022-12-01 NOTE — ED Notes (Signed)
Pt calling for help in her room.  This RN entered room to check on pt.  Pt states she needs to "get up" for the morning.  This RN informed pt of current time and let her know she could get some more rest.  When told it was almost 4 AM, pt asks, "Oh really?"  Pt then states, "Well I guess I better get a little more sleep.  Can you wake me up at 5?"  This RN asked pt what she had to do at 5 and pt responded, "I have to get up, shower, and do my hair."  Pt informed that this RN would check on her in a bit and that she could get a little more sleep.

## 2022-12-01 NOTE — ED Notes (Signed)
Pt wiped down per request for small bath and also changed into new brief following urinary occurrence.  New gown placed on pt and pt provided with warm blanket.

## 2022-12-02 DIAGNOSIS — R278 Other lack of coordination: Secondary | ICD-10-CM | POA: Diagnosis not present

## 2022-12-02 DIAGNOSIS — M6281 Muscle weakness (generalized): Secondary | ICD-10-CM | POA: Diagnosis not present

## 2022-12-02 DIAGNOSIS — R251 Tremor, unspecified: Secondary | ICD-10-CM | POA: Diagnosis not present

## 2022-12-02 DIAGNOSIS — G20A1 Parkinson's disease without dyskinesia, without mention of fluctuations: Secondary | ICD-10-CM | POA: Diagnosis not present

## 2022-12-02 DIAGNOSIS — G9340 Encephalopathy, unspecified: Secondary | ICD-10-CM | POA: Diagnosis not present

## 2022-12-02 DIAGNOSIS — Z23 Encounter for immunization: Secondary | ICD-10-CM | POA: Diagnosis not present

## 2022-12-02 DIAGNOSIS — G909 Disorder of the autonomic nervous system, unspecified: Secondary | ICD-10-CM | POA: Diagnosis not present

## 2022-12-02 DIAGNOSIS — S069XAA Unspecified intracranial injury with loss of consciousness status unknown, initial encounter: Secondary | ICD-10-CM | POA: Diagnosis not present

## 2022-12-02 DIAGNOSIS — E440 Moderate protein-calorie malnutrition: Secondary | ICD-10-CM | POA: Diagnosis not present

## 2022-12-02 DIAGNOSIS — R1310 Dysphagia, unspecified: Secondary | ICD-10-CM | POA: Diagnosis not present

## 2022-12-02 DIAGNOSIS — R2689 Other abnormalities of gait and mobility: Secondary | ICD-10-CM | POA: Diagnosis not present

## 2022-12-02 NOTE — ED Provider Notes (Addendum)
Emergency Medicine Observation Re-evaluation Note  Mandy Durham is a 77 y.o. female, seen on rounds today.  Pt initially presented to the ED for complaints of Weakness Currently, the patient is sitting in bed and eating breakfast no acute distress.  Physical Exam  BP 110/62 (BP Location: Right Arm)   Pulse 84   Temp 98 F (36.7 C) (Oral)   Resp 16   Ht 1.651 m (5\' 5" )   Wt 51.3 kg   SpO2 96%   BMI 18.82 kg/m  Physical Exam   ED Course / MDM  EKG:EKG Interpretation Date/Time:  Saturday November 28 2022 13:17:22 EDT Ventricular Rate:  197 PR Interval:    QRS Duration:  161 QT Interval:  382 QTC Calculation: 633 R Axis:   270  Text Interpretation: VENTRICULAR PACED RHYTHM Artifact in lead(s) III aVL aVF V1 V2 V3 V4 V5 V6 Confirmed by Gwyneth Sprout ([id number]) on 11/28/2022 4:21:14 PM  I have reviewed the labs performed to date as well as medications administered while in observation.  Recent changes in the last 24 hours include none.  Plan  Current plan is for placement.  10:16 AM Spoke with Dr. Logan Bores from patient's insurance company regards to authorizing stay in rehab and he has denied it at this time.  TOC will contact husband about his options of either come and pick up the patient or agreeing to cell front a SNF stay   Lorre Nick, MD 12/02/22 0900    Lorre Nick, MD 12/02/22 1017

## 2022-12-02 NOTE — TOC Transition Note (Signed)
Transition of Care [facility]) - CM/SW Discharge Note   Patient Details  Name: Mandy Durham MRN: [id number] Date of Birth: 05/14/1945  [facility] CM/SW Contact:  Carmina Miller, LCSWA Phone Number: 12/02/2022, 3:18 PM   Clinical Narrative:     CSW spoke with Haywood Lasso with [facility], states they are unable to accept pt due to not having a justifiable medical need, CSW informed Haywood Lasso that pt was going to [facility].     Barriers to Discharge: Unsafe home situation   Patient Goals and CMS Choice      Discharge Placement                         Discharge Plan and Services Additional resources added to the After Visit Summary for                            [facility] Arranged: PT, OT, Nurse's Aide, Social Work [facility] Agency: [facility] Date HH Agency Contacted: 11/29/22 Time HH Agency Contacted: [id number] Representative spoke with at [facility] Agency: Haywood Lasso  Social Determinants of Health (SDOH) Interventions SDOH Screenings   Food Insecurity: No Food Insecurity (05/25/2022)  Housing: Low Risk  (05/25/2022)  Transportation Needs: Unmet Transportation Needs (07/17/2022)  Utilities: Not At Risk (05/25/2022)  Alcohol Screen: Low Risk  (04/30/2021)  Depression (PHQ2-9): High Risk (09/21/2022)  Financial Resource Strain: Low Risk  (04/30/2021)  Physical Activity: Inactive (04/30/2021)  Social Connections: Moderately Integrated (04/30/2021)  Stress: Stress Concern Present (04/30/2021)  Tobacco Use: Low Risk  (11/28/2022)  Health Literacy: Low Risk  (06/24/2020)   Received from [facility]     Readmission Risk Interventions     No data to display

## 2022-12-02 NOTE — ED Notes (Signed)
[facility] called unable to give pick up time

## 2022-12-02 NOTE — ED Notes (Addendum)
Report given to Haematologist at [facility] re[facility].

## 2022-12-02 NOTE — Progress Notes (Addendum)
CSW received a call from [place] with [employer]. CSW was told that patient is approved for PTAR at discharge. Patients authorization number for PTAR is [id number]. CSW was told that patients SNF request went to the medical director and it was determined there is no medical necessity for patient to go to SNF. The medical director is requesting a peer to peer to be completed today by 2:00 PM. EDP notified.

## 2022-12-02 NOTE — Progress Notes (Signed)
CSW was informed by EDP that the Medical director with [employer] denied patient for SNF placement. CSW contacted patients husband Merlina Alderfer [phone] to make him aware. CSW told patients husband that she spoke with [facility] about providing home health services. Patients husband stated that he doesn't believe he can handle patient at home right now. Patients husband wanted to know if he could pay privately at [facility]. Patients husband asked CSW to contact [facility], [facility], and [facility] to find out how much he would need to pay. CSW told patients husband that most facilities are 8,000-10,000 for 30 days. Patients husband stated he is on the way to the bank now to take care of the finances. CSW told patients husband she would contact him later.    CSW spoke with French Ana in admissions at [facility] who currently has no beds but the cost would be $510 a day for a rehab bed.   CSW contacted Adella Nissen in admissions at [facility] who stated that it will be $281 for a semi-private room and $319 for a private room for 30 days. Lacinda Axon stated they can accept patient today if patients husband can pay.   CSW contacted [facility] [employer] and spoke with Lowella Bandy in admissions who stated their cost is $380 per day and patients husband would have to pay at least 2 weeks in advance.   11:17 AM: CSW contacted patients husband who stated he was able to get everything completed at the bank. CSW provided patients husband with the prices. Patients husband would like to go with a private room at [facility]. CSW spoke with Adella Nissen in admissions at [facility] who is in agreement. Patients husband is on his way to [place] now to complete the paperwork.   Patient can discharge when the paperwork is completed.

## 2022-12-03 DIAGNOSIS — R5381 Other malaise: Secondary | ICD-10-CM | POA: Diagnosis not present

## 2022-12-03 DIAGNOSIS — I951 Orthostatic hypotension: Secondary | ICD-10-CM | POA: Diagnosis not present

## 2022-12-03 DIAGNOSIS — I442 Atrioventricular block, complete: Secondary | ICD-10-CM | POA: Diagnosis not present

## 2022-12-03 DIAGNOSIS — G20A1 Parkinson's disease without dyskinesia, without mention of fluctuations: Secondary | ICD-10-CM | POA: Diagnosis not present

## 2022-12-07 ENCOUNTER — Telehealth: Payer: Self-pay | Admitting: *Deleted

## 2022-12-07 DIAGNOSIS — E119 Type 2 diabetes mellitus without complications: Secondary | ICD-10-CM | POA: Diagnosis not present

## 2022-12-07 DIAGNOSIS — D649 Anemia, unspecified: Secondary | ICD-10-CM | POA: Diagnosis not present

## 2022-12-07 DIAGNOSIS — E785 Hyperlipidemia, unspecified: Secondary | ICD-10-CM | POA: Diagnosis not present

## 2022-12-07 DIAGNOSIS — I10 Essential (primary) hypertension: Secondary | ICD-10-CM | POA: Diagnosis not present

## 2022-12-07 NOTE — Telephone Encounter (Signed)
-----   Message from Butch Penny sent at 12/07/2022  3:05 PM EDT ----- Received paper form. Negative for [facility]. Please let patient know

## 2022-12-07 NOTE — Telephone Encounter (Signed)
Spoke to husband (checked DPR) Gave lab results  Husband informed me that Pt is currently in Rehab at [facility] for the next 30 days Pt was unable to walk and was admitted to hospital stayed for 5 days and released to rehab . Pt thanked me for calling . Will forward message to Megan,NP

## 2022-12-13 DIAGNOSIS — G243 Spasmodic torticollis: Secondary | ICD-10-CM | POA: Diagnosis not present

## 2022-12-13 DIAGNOSIS — G20C Parkinsonism, unspecified: Secondary | ICD-10-CM | POA: Diagnosis not present

## 2022-12-17 DIAGNOSIS — Z23 Encounter for immunization: Secondary | ICD-10-CM | POA: Diagnosis not present

## 2022-12-17 LAB — INFORMED CONSENT NEEDED

## 2022-12-22 ENCOUNTER — Ambulatory Visit: Payer: PPO | Admitting: Internal Medicine

## 2022-12-28 DIAGNOSIS — R251 Tremor, unspecified: Secondary | ICD-10-CM | POA: Diagnosis not present

## 2022-12-28 DIAGNOSIS — G20A1 Parkinson's disease without dyskinesia, without mention of fluctuations: Secondary | ICD-10-CM | POA: Diagnosis not present

## 2022-12-28 DIAGNOSIS — R5381 Other malaise: Secondary | ICD-10-CM | POA: Diagnosis not present

## 2022-12-28 DIAGNOSIS — I5032 Chronic diastolic (congestive) heart failure: Secondary | ICD-10-CM | POA: Diagnosis not present

## 2023-01-12 DIAGNOSIS — G243 Spasmodic torticollis: Secondary | ICD-10-CM | POA: Diagnosis not present

## 2023-01-12 DIAGNOSIS — G20C Parkinsonism, unspecified: Secondary | ICD-10-CM | POA: Diagnosis not present

## 2023-01-15 DIAGNOSIS — R269 Unspecified abnormalities of gait and mobility: Secondary | ICD-10-CM | POA: Diagnosis not present

## 2023-01-15 DIAGNOSIS — G20A1 Parkinson's disease without dyskinesia, without mention of fluctuations: Secondary | ICD-10-CM | POA: Diagnosis not present

## 2023-01-15 DIAGNOSIS — F32A Depression, unspecified: Secondary | ICD-10-CM | POA: Diagnosis not present

## 2023-01-15 DIAGNOSIS — W19XXXA Unspecified fall, initial encounter: Secondary | ICD-10-CM | POA: Diagnosis not present

## 2023-01-21 ENCOUNTER — Encounter: Payer: Self-pay | Admitting: Internal Medicine

## 2023-01-22 DIAGNOSIS — R5381 Other malaise: Secondary | ICD-10-CM | POA: Diagnosis not present

## 2023-01-22 DIAGNOSIS — G20A1 Parkinson's disease without dyskinesia, without mention of fluctuations: Secondary | ICD-10-CM | POA: Diagnosis not present

## 2023-01-22 DIAGNOSIS — I442 Atrioventricular block, complete: Secondary | ICD-10-CM | POA: Diagnosis not present

## 2023-01-22 DIAGNOSIS — I951 Orthostatic hypotension: Secondary | ICD-10-CM | POA: Diagnosis not present

## 2023-02-10 ENCOUNTER — Ambulatory Visit: Payer: PPO | Admitting: Podiatry

## 2023-02-12 DIAGNOSIS — G20C Parkinsonism, unspecified: Secondary | ICD-10-CM | POA: Diagnosis not present

## 2023-02-12 DIAGNOSIS — G243 Spasmodic torticollis: Secondary | ICD-10-CM | POA: Diagnosis not present

## 2023-02-16 DIAGNOSIS — F411 Generalized anxiety disorder: Secondary | ICD-10-CM | POA: Diagnosis not present

## 2023-02-16 DIAGNOSIS — F331 Major depressive disorder, recurrent, moderate: Secondary | ICD-10-CM | POA: Diagnosis not present

## 2023-02-19 ENCOUNTER — Telehealth: Payer: Self-pay | Admitting: Cardiology

## 2023-02-19 NOTE — Telephone Encounter (Signed)
Husband states pt is in [place] and has questions about device transmissions, He would like a call back

## 2023-02-20 DIAGNOSIS — M79641 Pain in right hand: Secondary | ICD-10-CM | POA: Diagnosis not present

## 2023-02-22 DIAGNOSIS — I5032 Chronic diastolic (congestive) heart failure: Secondary | ICD-10-CM | POA: Diagnosis not present

## 2023-02-22 DIAGNOSIS — G20A1 Parkinson's disease without dyskinesia, without mention of fluctuations: Secondary | ICD-10-CM | POA: Diagnosis not present

## 2023-02-22 DIAGNOSIS — R251 Tremor, unspecified: Secondary | ICD-10-CM | POA: Diagnosis not present

## 2023-02-22 DIAGNOSIS — M79641 Pain in right hand: Secondary | ICD-10-CM | POA: Diagnosis not present

## 2023-02-22 NOTE — Telephone Encounter (Signed)
I let the patient husband know that the monitor needs to be at the facility with the patient. The facility can call us to get help sending a transmission.

## 2023-02-24 ENCOUNTER — Ambulatory Visit (INDEPENDENT_AMBULATORY_CARE_PROVIDER_SITE_OTHER): Payer: PPO

## 2023-02-24 DIAGNOSIS — I441 Atrioventricular block, second degree: Secondary | ICD-10-CM

## 2023-02-25 LAB — CUP PACEART REMOTE DEVICE CHECK
Battery Remaining Longevity: 28 mo
Battery Voltage: 2.95 V
Brady Statistic AP VP Percent: 5.66 %
Brady Statistic AP VS Percent: 0.01 %
Brady Statistic AS VP Percent: 94.29 %
Brady Statistic AS VS Percent: 0.04 %
Brady Statistic RA Percent Paced: 5.64 %
Brady Statistic RV Percent Paced: 99.95 %
Date Time Interrogation Session: 20241211125617
Implantable Lead Connection Status: 753985
Implantable Lead Connection Status: 753985
Implantable Lead Implant Date: 20171208
Implantable Lead Implant Date: 20171208
Implantable Lead Location: 753859
Implantable Lead Location: 753860
Implantable Lead Model: 5076
Implantable Lead Model: 5076
Implantable Pulse Generator Implant Date: 20171208
Lead Channel Impedance Value: 361 Ohm
Lead Channel Impedance Value: 399 Ohm
Lead Channel Impedance Value: 418 Ohm
Lead Channel Impedance Value: 513 Ohm
Lead Channel Pacing Threshold Amplitude: 0.625 V
Lead Channel Pacing Threshold Amplitude: 0.625 V
Lead Channel Pacing Threshold Pulse Width: 0.4 ms
Lead Channel Pacing Threshold Pulse Width: 0.4 ms
Lead Channel Sensing Intrinsic Amplitude: 2.75 mV
Lead Channel Sensing Intrinsic Amplitude: 2.75 mV
Lead Channel Sensing Intrinsic Amplitude: 9 mV
Lead Channel Sensing Intrinsic Amplitude: 9 mV
Lead Channel Setting Pacing Amplitude: 2 V
Lead Channel Setting Pacing Amplitude: 2.5 V
Lead Channel Setting Pacing Pulse Width: 0.4 ms
Lead Channel Setting Sensing Sensitivity: 4 mV
Zone Setting Status: 755011
Zone Setting Status: 755011

## 2023-03-03 ENCOUNTER — Telehealth: Payer: Self-pay | Admitting: Neurology

## 2023-03-03 NOTE — Telephone Encounter (Signed)
Pt's husband called to confirm upcoming appt details

## 2023-03-08 DIAGNOSIS — H109 Unspecified conjunctivitis: Secondary | ICD-10-CM | POA: Diagnosis not present

## 2023-03-14 DIAGNOSIS — G243 Spasmodic torticollis: Secondary | ICD-10-CM | POA: Diagnosis not present

## 2023-03-14 DIAGNOSIS — G20C Parkinsonism, unspecified: Secondary | ICD-10-CM | POA: Diagnosis not present

## 2023-03-16 DIAGNOSIS — F331 Major depressive disorder, recurrent, moderate: Secondary | ICD-10-CM | POA: Diagnosis not present

## 2023-03-16 DIAGNOSIS — F411 Generalized anxiety disorder: Secondary | ICD-10-CM | POA: Diagnosis not present

## 2023-03-23 ENCOUNTER — Ambulatory Visit: Payer: PPO | Admitting: Neurology

## 2023-03-23 ENCOUNTER — Encounter: Payer: Self-pay | Admitting: Neurology

## 2023-03-23 VITALS — BP 117/69 | HR 74 | Ht 66.0 in

## 2023-03-23 DIAGNOSIS — F909 Attention-deficit hyperactivity disorder, unspecified type: Secondary | ICD-10-CM

## 2023-03-23 DIAGNOSIS — G2401 Drug induced subacute dyskinesia: Secondary | ICD-10-CM

## 2023-03-23 DIAGNOSIS — G249 Dystonia, unspecified: Secondary | ICD-10-CM | POA: Diagnosis not present

## 2023-03-23 DIAGNOSIS — G20C Parkinsonism, unspecified: Secondary | ICD-10-CM

## 2023-03-23 DIAGNOSIS — G243 Spasmodic torticollis: Secondary | ICD-10-CM

## 2023-03-23 DIAGNOSIS — S06342S Traumatic hemorrhage of right cerebrum with loss of consciousness of 31 minutes to 59 minutes, sequela: Secondary | ICD-10-CM

## 2023-03-23 MED ORDER — CARBIDOPA-LEVODOPA 25-100 MG PO TABS: ORAL_TABLET | ORAL | 12 refills | Status: AC

## 2023-03-23 NOTE — Progress Notes (Signed)
 Provider:  Dedra Gores, MD  Primary Care Physician:  Mandy Aloysius BRAVO, MD [address] [place] [address]     Referring Provider: Amon Aloysius BRAVO, Md [address] [place],  [place] [address]          Chief Complaint according to patient   Patient presents with:     New Patient (Initial Visit)           HISTORY OF PRESENT ILLNESS:  Mandy Durham is a 78 y.o. female patient who is here for revisit 03/23/2023 for  dementia follow up.   Mr silfies reports  about the interval history,    Since last August when she presented severely slowed with new onset encephalopathy, she had undergone EEG here and it showed bi-hemispherical severe slowing.  She  developed a severe gait impairment, tiny little steps only. One morning she no longer was able to do that.  She moved to [employer] and  needed 2 people to assisted her with walking, she presented  with a scissored gait and her husband cannot assist her with gait, she  is at risk of losing her PT  due to goals not being met.   Cervical dystonia.  She has a form of PD associated with rigor, with a coarse  tremor and with dysautonomia.  She has intermittently diplopia.  She has the ability to extend both hands ,all 5 fingers.     10/13/21: Mandy Durham is a 78 year old female with a history of parkinson's disease. She returns today for follow-up. Saw Dr. Evonnie in May and she made the recommendation to change to sinemet  25-100 QID and to stop requip . Reports that Dyskinesias has gotten worse. Trouble speaking, moving shoulders and lips. She was given baclofen  5 mg BID and found it beneficial but feels that the dosage needs to be higher. Using it for neck pain.  She reports right now she feels that she has no quality of life.   When she saw Dr. Evonnie there was some concern about possible MSA due to the patient's symptoms of diplopia and low-dose dyskinesia.  At that time Sinemet  was increased to 25-100 mg 4  times a day.      Review of Systems: Out of a complete 14 system review, the patient complains of only the following symptoms, and all other reviewed systems are negative.:    Parkinsons disease with speech impairment, non  fluent.   Social History   Socioeconomic History   Marital status: Married    Spouse name: Mandy Durham   Number of children: 2   Years of education: 12   Highest education level: 12th grade  Occupational History   Occupation: retired Futures Trader: [employer]  Tobacco Use   Smoking status: Never    Passive exposure: Never   Smokeless tobacco: Never  Vaping Use   Vaping status: Never Used  Substance and Sexual Activity   Alcohol use: No   Drug use: No   Sexual activity: Not Currently  Other Topics Concern   Not on file  Social History Narrative   HSG.  Married '65.     2 daughters,   '71 Bari, lives in [place].   '77; Dorthea, lives in [place]    4 grandchildren   Lives with husband   Right handed   Caffeine: half -1 cup a coffee  a day   Pt currently lives at [facility]   Financial Resource Strain: Low Risk  (04/30/2021)   Overall Financial Resource Strain (CARDIA)    Difficulty of Paying Living Expenses: Not hard at all  Food Insecurity: No Food Insecurity (05/25/2022)   Hunger Vital Sign    Worried About Running Out of Food in the Last Year: Never true    Ran Out of Food in the Last Year: Never true  Transportation Needs: Unmet Transportation Needs (07/17/2022)   PRAPARE - Administrator, Civil Service (Medical): No    Lack of Transportation (Non-Medical): Yes  Physical Activity: Inactive (04/30/2021)   Exercise Vital Sign    Days of Exercise per Week: 0 days    Minutes of Exercise per Session: 0 min  Stress: Stress Concern Present (04/30/2021)   Mandy Durham    Feeling of  Stress : Rather much  Social Connections: Moderately Integrated (04/30/2021)   Social Connection and Isolation Panel [NHANES]    Frequency of Communication with Friends and Family: More than three times a week    Frequency of Social Gatherings with Friends and Family: Three times a week    Attends Religious Services: 1 to 4 times per year    Active Member of Clubs or Organizations: No    Attends Engineer, Structural: Never    Marital Status: Married    Family History  Problem Relation Age of Onset   Heart failure Mother    Coronary artery disease Mother    Dementia Mother    Diabetes Mother    Colon cancer Neg Hx    Breast cancer Neg Hx    Hypertension Neg Hx    Heart disease Neg Hx    Heart attack Neg Hx    Stroke Neg Hx    Rectal cancer Neg Hx    Stomach cancer Neg Hx    Esophageal cancer Neg Hx    Liver disease Neg Hx    Parkinson's disease Neg Hx     Past Medical History:  Diagnosis Date   Anxiety state, unspecified    Hyperlipidemia    Hypertension    Insomnia    LBBB (left bundle branch block)    LHC in 2002 showed normal coronaries.    Osteoarthrosis, unspecified whether generalized or localized, unspecified site    Overweight(278.02)    Parkinson disease (HCC)    S/P placement of cardiac pacemaker    a. 02/21/16: Medtronic Advisa DR MRI SureScan model [id number] (serial number [id number])    Serrated adenoma of colon 2007   Symptomatic menopausal or female climacteric states    Syncope and collapse    11/12: Holter 12/12 with rare PACS, HR range 64-140, average 82, no significant arrhythmias. Echo (1/13): EF 50-55%, mild LVH, septal-lateral dyssynchrony c/w LBBB. 3-week event monitor (1/13): No significant arrhythmia.    Ventricular tachycardia (HCC) 05/24/2022   Patient with 3:1 AVB with pacemaker. She is noted to be tachycardic on telemetry but is asymptomatic  Plan    Past Surgical History:  Procedure Laterality Date   COLONOSCOPY  2007   EP  IMPLANTABLE DEVICE N/A 02/21/2016   Procedure: Pacemaker Implant;  Surgeon: Will Gladis Norton, MD;  Location: [facility];  Service: Cardiovascular;  Laterality: N/A;   POLYPECTOMY       Current Outpatient Medications on File Prior  to Visit  Medication Sig Dispense Refill   ACETAMINOPHEN  PO Take 500 mg by mouth every 6 (six) hours as needed for moderate pain. Patient taking twice a day     aspirin  EC 81 MG tablet Take 81 mg by mouth daily. Swallow whole.     carbidopa -levodopa  (SINEMET  IR) 25-100 MG tablet Take 1 tablet by mouth in the AM, 1/2 tablet at noon, 1/2 tablet in the afternoon and 1 table at bedtime 90 tablet 12   escitalopram  (LEXAPRO ) 10 MG tablet Take 1 tablet (10 mg total) by mouth daily. 90 tablet 1   ezetimibe  (ZETIA ) 10 MG tablet Take 1 tablet (10 mg total) by mouth daily. 90 tablet 1   Fluticasone -Umeclidin-Vilant (TRELEGY ELLIPTA ) 100-62.5-25 MCG/ACT AEPB Inhale 1 puff into the lungs daily. 14 each 0   lactose free nutrition (BOOST PLUS) LIQD Take 237 mLs by mouth 2 (two) times daily between meals.  0   Menthol, Topical Analgesic, (BIOFREEZE ROLL-ON) 4 % GEL Apply topically as needed (for neck / shoulder pain). Use 3 or 4 times per day     nitroGLYCERIN  (NITROSTAT ) 0.4 MG SL tablet Place 1 tablet (0.4 mg total) under the tongue every 5 (five) minutes x 3 doses as needed for chest pain. 25 tablet 1   tetrabenazine  (XENAZINE ) 12.5 MG tablet Take 1 tablet (12.5 mg total) by mouth 3 (three) times daily. 90 tablet 5   No current facility-administered medications on file prior to visit.    No Known Allergies   DIAGNOSTIC DATA (LABS, IMAGING, TESTING) - I reviewed patient records, labs, notes, testing and imaging myself where available.  Lab Results  Component Value Date   WBC 8.2 11/28/2022   HGB 12.4 11/28/2022   HCT 39.2 11/28/2022   MCV 89.5 11/28/2022   PLT 287 11/28/2022      Component Value Date/Time   NA 141 11/28/2022 1246   NA 138 08/26/2021 0000    K 3.5 11/28/2022 1246   CL 104 11/28/2022 1246   CO2 28 11/28/2022 1246   GLUCOSE 107 (H) 11/28/2022 1246   BUN 28 (H) 11/28/2022 1246   BUN 19 08/26/2021 0000   CREATININE 0.91 11/28/2022 1246   CREATININE 0.85 06/07/2020 1030   CALCIUM  9.2 11/28/2022 1246   PROT 6.3 (L) 11/28/2022 1246   PROT 6.3 06/09/2021 1450   ALBUMIN 3.6 11/28/2022 1246   ALBUMIN 4.2 06/09/2021 1450   AST 58 (H) 11/28/2022 1246   ALT 29 11/28/2022 1246   ALKPHOS 54 11/28/2022 1246   BILITOT 0.8 11/28/2022 1246   BILITOT 0.3 06/09/2021 1450   GFRNONAA >60 11/28/2022 1246   GFRAA 93 12/04/2019 1540   Lab Results  Component Value Date   CHOL 200 05/26/2021   HDL 108.70 05/26/2021   LDLCALC 76 05/26/2021   LDLDIRECT 106.9 11/03/2012   TRIG 75.0 05/26/2021   CHOLHDL 2 05/26/2021   Lab Results  Component Value Date   HGBA1C 6.6 (H) 01/28/2022   Lab Results  Component Value Date   VITAMINB12 290 06/09/2021   Lab Results  Component Value Date   TSH 1.397 03/19/2022    PHYSICAL EXAM:  Today's Vitals   03/23/23 1548  BP: 117/69  Pulse: 74  Height: 5' 6 (1.676 m)   Body mass index is 18.25 kg/m.   Wt Readings from Last 3 Encounters:  11/29/22 113 lb 1.5 oz (51.3 kg)  10/29/22 113 lb (51.3 kg)  10/20/22 113 lb (51.3 kg)     Ht Readings from  Last 3 Encounters:  03/23/23 5' 6 (1.676 m)  11/29/22 5' 5 (1.651 m)  10/29/22 5' 5 (1.651 m)      General: The patient is awake, alert and appears not in acute distress. The patient is well groomed.  Cervical dystonia  noted. Bilateral hand and arm tremor, rigidity with scissor gait.  Head: Normocephalic, atraumatic. Cardiovascular: irregular rate and cardiac rhythm by pulse,  without distended neck veins. Respiratory: Lungs are clear to auscultation.  Skin:  Without evidence of ankle edema, or rash. Trunk: The patient's posture is erect.   NEUROLOGIC EXAM: The patient is awake and alert, oriented to place and time.   Memory subjective  described as intact.  Attention span & concentration ability appears normal.  Speech is non- fluent,  poorly modulated and she has difficulties to get the words out,  aphasia.   She knew it was January 2025, she knew the state , county and city.  Mood and affect are appropriate.   Cranial nerves: no loss of smell or taste reported  Pupils are equal and briskly reactive to light. Funduscopic exam deferred. .  Extraocular movements in vertical and horizontal planes were icoarse, compensating for dystonia.  nystagmus. Diplopia. Visual fields by finger perimetry are intact. Hearing was intact to soft voice and finger rubbing.    Facial sensation intact to fine touch.  Facial motor strength is symmetric and tongue and uvula move midline.  Neck ROM : rotation, tilt and flexion extension were normal for age and shoulder shrug was symmetrical.    Motor exam:  rigor and tremor.  Could be standing for several seconds with assistance.  Cannot rise unassisted seen here in wheelchair, legs scissor.  Toe and heel walk were deferred.    ASSESSMENT AND PLAN 78 y.o. year old female  here with:   She related onset on symptoms after a fall 8 years ago, her tremor began and increased since- as she recalls today.  She reports not feeling much joy in life and is der pressed by her appearance, her limitations,  Parkinson disease.  Now immobile, wheelchair seated.     Cervical dystonia. Stutter.  She has a form of PD associated with rigor, with a coarse  tremor and with dysautonomia.  She has intermittently diplopia.  She has the ability to extend both hands ,all 5 fingers. She had developed dyskinesia / hyperkinesis when her sinemet  was higher dosed, qid, now using 25/ 100 1 in AM and 1/2 at lunch, 1/2 at coffee time and one full tab at 8 Pm.    I want to make sure that PT, gait exercises will continue.  She is not getting better but she is not getting worse.  She needs to move with the support of a  capable strong person, not her husband.   Need to continue medication. I will increase the lunchtime dose to a full tab 25/ 100 sinemet  IR.   The patient has a pacemaker and I have not ordered MRIs for this reason. She underwent MRI 06-05-2022 No evidence of acute intracranial abnormality. 2. Remote insult in right frontal lobe and right insula.   On: 05/25/2022 16:35    I plan to follow up either personally or through our NP within 6-8  months.   I would like to thank  Mandy Aloysius BRAVO, Md [address] [place],  [place] [address] for allowing me to meet with and to take care of this pleasant patient.  After spending a total time of  45  minutes face to face and additional time for physical and neurologic examination, review of laboratory studies,  personal review of imaging studies, reports and results of other testing and review of referral information / records as far as provided in visit,   Electronically signed by: Mandy Gores, MD 03/23/2023 3:57 PM  [facility] Board certified by The [employer] of Sleep Medicine and Diplomate of the [employer] of [employer] Medicine. Board certified In Neurology through the ABPN, Fellow of the [employer] of [employer].

## 2023-03-23 NOTE — Patient Instructions (Signed)
 Increased sine met 25/ 2100 mg to 1 full tab at 7 AM, before breakfast, one at 12 noon , just before lunch, 1/2 tab at 4 PM and 1 full tab at 8-9PM.

## 2023-04-02 DIAGNOSIS — M25511 Pain in right shoulder: Secondary | ICD-10-CM | POA: Diagnosis not present

## 2023-04-02 DIAGNOSIS — M25512 Pain in left shoulder: Secondary | ICD-10-CM | POA: Diagnosis not present

## 2023-04-02 NOTE — Addendum Note (Signed)
Addended by: Elease Etienne A on: 04/02/2023 01:44 PM   Modules accepted: Orders

## 2023-04-02 NOTE — Progress Notes (Signed)
Remote pacemaker transmission.   

## 2023-04-06 DIAGNOSIS — G20A1 Parkinson's disease without dyskinesia, without mention of fluctuations: Secondary | ICD-10-CM | POA: Diagnosis not present

## 2023-04-06 DIAGNOSIS — M7542 Impingement syndrome of left shoulder: Secondary | ICD-10-CM | POA: Diagnosis not present

## 2023-04-06 DIAGNOSIS — R4182 Altered mental status, unspecified: Secondary | ICD-10-CM | POA: Diagnosis not present

## 2023-04-07 DIAGNOSIS — N390 Urinary tract infection, site not specified: Secondary | ICD-10-CM | POA: Diagnosis not present

## 2023-04-08 DIAGNOSIS — N390 Urinary tract infection, site not specified: Secondary | ICD-10-CM | POA: Diagnosis not present

## 2023-04-09 DIAGNOSIS — N390 Urinary tract infection, site not specified: Secondary | ICD-10-CM | POA: Diagnosis not present

## 2023-04-13 DIAGNOSIS — M7542 Impingement syndrome of left shoulder: Secondary | ICD-10-CM | POA: Diagnosis not present

## 2023-04-13 DIAGNOSIS — F411 Generalized anxiety disorder: Secondary | ICD-10-CM | POA: Diagnosis not present

## 2023-04-13 DIAGNOSIS — F331 Major depressive disorder, recurrent, moderate: Secondary | ICD-10-CM | POA: Diagnosis not present

## 2023-04-13 DIAGNOSIS — N3000 Acute cystitis without hematuria: Secondary | ICD-10-CM | POA: Diagnosis not present

## 2023-04-13 DIAGNOSIS — E876 Hypokalemia: Secondary | ICD-10-CM | POA: Diagnosis not present

## 2023-04-14 DIAGNOSIS — G243 Spasmodic torticollis: Secondary | ICD-10-CM | POA: Diagnosis not present

## 2023-04-14 DIAGNOSIS — G20C Parkinsonism, unspecified: Secondary | ICD-10-CM | POA: Diagnosis not present

## 2023-04-22 ENCOUNTER — Ambulatory Visit: Payer: PPO | Admitting: Neurology

## 2023-04-23 DIAGNOSIS — E876 Hypokalemia: Secondary | ICD-10-CM | POA: Diagnosis not present

## 2023-04-29 DIAGNOSIS — I951 Orthostatic hypotension: Secondary | ICD-10-CM | POA: Diagnosis not present

## 2023-04-29 DIAGNOSIS — R5381 Other malaise: Secondary | ICD-10-CM | POA: Diagnosis not present

## 2023-04-29 DIAGNOSIS — G20A1 Parkinson's disease without dyskinesia, without mention of fluctuations: Secondary | ICD-10-CM | POA: Diagnosis not present

## 2023-04-29 DIAGNOSIS — I442 Atrioventricular block, complete: Secondary | ICD-10-CM | POA: Diagnosis not present

## 2023-05-04 DIAGNOSIS — F411 Generalized anxiety disorder: Secondary | ICD-10-CM | POA: Diagnosis not present

## 2023-05-04 DIAGNOSIS — F331 Major depressive disorder, recurrent, moderate: Secondary | ICD-10-CM | POA: Diagnosis not present

## 2023-05-14 DIAGNOSIS — G20C Parkinsonism, unspecified: Secondary | ICD-10-CM | POA: Diagnosis not present

## 2023-05-14 DIAGNOSIS — G243 Spasmodic torticollis: Secondary | ICD-10-CM | POA: Diagnosis not present

## 2023-05-18 DIAGNOSIS — F411 Generalized anxiety disorder: Secondary | ICD-10-CM | POA: Diagnosis not present

## 2023-05-18 DIAGNOSIS — R41841 Cognitive communication deficit: Secondary | ICD-10-CM | POA: Diagnosis not present

## 2023-05-18 DIAGNOSIS — F331 Major depressive disorder, recurrent, moderate: Secondary | ICD-10-CM | POA: Diagnosis not present

## 2023-05-20 DIAGNOSIS — E440 Moderate protein-calorie malnutrition: Secondary | ICD-10-CM | POA: Diagnosis not present

## 2023-05-20 DIAGNOSIS — F0283 Dementia in other diseases classified elsewhere, unspecified severity, with mood disturbance: Secondary | ICD-10-CM | POA: Diagnosis not present

## 2023-05-20 DIAGNOSIS — G20A1 Parkinson's disease without dyskinesia, without mention of fluctuations: Secondary | ICD-10-CM | POA: Diagnosis not present

## 2023-05-20 DIAGNOSIS — R251 Tremor, unspecified: Secondary | ICD-10-CM | POA: Diagnosis not present

## 2023-05-21 NOTE — Progress Notes (Signed)
 PATIENT: Mandy Durham DOB: 1945-10-12  REASON FOR VISIT: follow up HISTORY FROM: patient PRIMARY NEUROLOGIST: Dr. Vickey Huger  Chief Complaint  Patient presents with   Follow-up    Patient in room #9 with her husband. Patient husband states the home reported pt sleep a lot and been having a lot of shakes. Patient husband states he notice she getting worse and losing weight.     HISTORY OF PRESENT ILLNESS: Today 05/21/23:  Mandy Durham is a 78 y.o. female with a history of atypical Parkinson's disease. Returns today for follow-up.  She is here with her husband today.  He reports that the physician at her facility [facility] increased her Sinemet to 1 full tablet 4 times a day.  He states that that has been beneficial.  He also questions if they give her the medication on time because at times her tremors are worse.  He also states that she has lost weight.  Reports that she does not eat a lot.  He states that he tends not to feed her meat because she has trouble chewing it.  He has been bringing her ensures to drink.  Long-term plan is for her to stay at the facility.  He also reports that speech has become very limited.  She returns today for an evaluation.   (Copied from Dr.Dohmeier's note)Mandy Durham is a 78 y.o. female patient who is here for revisit 03/23/2023 for  dementia follow up.   Mr bobst reports  about the interval history,     Since last August when she presented severely slowed with new onset encephalopathy, she had undergone EEG here and it showed bi-hemispherical severe slowing.  She  developed a severe gait impairment, tiny little steps only. One morning she no longer was able to do that.  She moved to [employer] and  needed 2 people to assisted her with walking, she presented  with a scissored gait and her husband cannot assist her with gait, she  is at risk of losing her PT  due to goals not being met.    Cervical dystonia.  She has a form of PD associated  with rigor, with a coarse  tremor and with dysautonomia.  She has intermittently diplopia.  She has the ability to extend both hands ,all 5 fingers.   Mandy Durham is a 78 y.o. female with a history of atypical Parkinson's disease and cervical dystonia. Returns today for follow-up.  At the last visit with Dr. Vickey Huger she was referred to Dr. Terrace Arabia for possible Botox injections due to dystonia.  The patient was called for an appointment but never set this up.  She remains on Sinemet 1 tablet 4 times a day.  Has significant dyskinesias at affects her quality of life.  Speech is severely affected.  She states that she is currently living at home with her husband.  But she feels that if her symptoms are getting worse she may have to consider a skilled nursing facility in the future.  She states that she does not want to take this route as she feels that this is "no life to live."   12/18/21 (Dohmeier):Ms. Winward is a 78 year old female with a history of tremor and dystonia- she has changed sicne my last visit with hr to 25/ 100 mg four times a day and she lost a lot of weight . She is moving all the time, with hesitant speech today and her neck is tilted  to her left, chin down, pointing to the chest, and she looks up from this  head down position" - speech is clearly affected , stammering speech, no fluent-  and dysphonia , but she denies dysphagia. I think we are looking at dystonia.  Left neck paraspinal  muscles and trapezius is contracted  as is the platysm. Amantadine as ordered after a phone conversation 12-02-2021 but not collected at pharmacy. Her left arm is stiff,  flexed and the right arm and hand are much smoother in movement. She related onset on symptoms after a fall 8 years ago, her tremor began and increased since- as she recalls today.  She reports not feeling much joy in life and is der pressed by her appearance, her limitations, Her memory is of concern to her.     10/13/21: Ms. Hartung is a  78 year old female with a history of parkinson's disease. She returns today for follow-up. Saw Dr. Arbutus Leas in May and she made the recommendation to change to sinemet 25-100 QID and to stop requip. Reports that Dyskinesias has gotten worse. Trouble speaking, moving shoulders and lips. She was given baclofen 5 mg BID and found it beneficial but feels that the dosage needs to be higher. Using it for neck pain.  She reports right now she feels that she has no quality of life.  When she saw Dr. Arbutus Leas there was some concern about possible MSA due to the patient's symptoms of diplopia and low-dose dyskinesia.  At that time Sinemet was increased to 25-100 mg 4 times a day.  REVIEW OF SYSTEMS: Out of a complete 14 system review of symptoms, the patient complains only of the following symptoms, and all other reviewed systems are negative.  ALLERGIES: No Known Allergies  HOME MEDICATIONS: Outpatient Medications Prior to Visit  Medication Sig Dispense Refill   ACETAMINOPHEN PO Take 500 mg by mouth every 6 (six) hours as needed for moderate pain. Patient taking twice a day     aspirin EC 81 MG tablet Take 81 mg by mouth daily. Swallow whole.     carbidopa-levodopa (SINEMET IR) 25-100 MG tablet Take 1 tablet by mouth in the AM, 1 full tablet at noon, 1/2 tablet in the afternoon and 1 tablet at bedtime ( 9 Pm) 120 tablet 12   escitalopram (LEXAPRO) 10 MG tablet Take 1 tablet (10 mg total) by mouth daily. 90 tablet 1   ezetimibe (ZETIA) 10 MG tablet Take 1 tablet (10 mg total) by mouth daily. 90 tablet 1   Fluticasone-Umeclidin-Vilant (TRELEGY ELLIPTA) 100-62.5-25 MCG/ACT AEPB Inhale 1 puff into the lungs daily. 14 each 0   lactose free nutrition (BOOST PLUS) LIQD Take 237 mLs by mouth 2 (two) times daily between meals.  0   Menthol, Topical Analgesic, (BIOFREEZE ROLL-ON) 4 % GEL Apply topically as needed (for neck / shoulder pain). Use 3 or 4 times per day     nitroGLYCERIN (NITROSTAT) 0.4 MG SL tablet Place 1  tablet (0.4 mg total) under the tongue every 5 (five) minutes x 3 doses as needed for chest pain. 25 tablet 1   tetrabenazine (XENAZINE) 12.5 MG tablet Take 1 tablet (12.5 mg total) by mouth 3 (three) times daily. 90 tablet 5   No facility-administered medications prior to visit.    PAST MEDICAL HISTORY: Past Medical History:  Diagnosis Date   Anxiety state, unspecified    Hyperlipidemia    Hypertension    Insomnia    LBBB (left bundle branch block)  LHC in 2002 showed normal coronaries.    Osteoarthrosis, unspecified whether generalized or localized, unspecified site    Overweight(278.02)    Parkinson disease (HCC)    S/P placement of cardiac pacemaker    a. 02/21/16: Medtronic Advisa DR MRI SureScan model [id number] (serial number [id number])    Serrated adenoma of colon 2007   Symptomatic menopausal or female climacteric states    Syncope and collapse    11/12: Holter 12/12 with rare PACS, HR range 64-140, average 82, no significant arrhythmias. Echo (1/13): EF 50-55%, mild LVH, septal-lateral dyssynchrony c/w LBBB. 3-week event monitor (1/13): No significant arrhythmia.    Ventricular tachycardia (HCC) 05/24/2022   Patient with 3:1 AVB with pacemaker. She is noted to be tachycardic on telemetry but is asymptomatic  Plan    PAST SURGICAL HISTORY: Past Surgical History:  Procedure Laterality Date   COLONOSCOPY  2007   EP IMPLANTABLE DEVICE N/A 02/21/2016   Procedure: Pacemaker Implant;  Surgeon: Will Jorja Loa, MD;  Location: [facility];  Service: Cardiovascular;  Laterality: N/A;   POLYPECTOMY      FAMILY HISTORY: Family History  Problem Relation Age of Onset   Heart failure Mother    Coronary artery disease Mother    Dementia Mother    Diabetes Mother    Colon cancer Neg Hx    Breast cancer Neg Hx    Hypertension Neg Hx    Heart disease Neg Hx    Heart attack Neg Hx    Stroke Neg Hx    Rectal cancer Neg Hx    Stomach cancer Neg Hx    Esophageal  cancer Neg Hx    Liver disease Neg Hx    Parkinson's disease Neg Hx     SOCIAL HISTORY: Social History   Socioeconomic History   Marital status: Married    Spouse name: Eddie   Number of children: 2   Years of education: 12   Highest education level: 12th grade  Occupational History   Occupation: retired Futures trader: [employer]  Tobacco Use   Smoking status: Never    Passive exposure: Never   Smokeless tobacco: Never  Vaping Use   Vaping status: Never Used  Substance and Sexual Activity   Alcohol use: No   Drug use: No   Sexual activity: Not Currently  Other Topics Concern   Not on file  Social History Narrative   HSG.  Married '65.     2 daughters,   '71 Neysa Bonito, lives in [place].   '77; Arline Asp, lives in [place]   4 grandchildren   Lives with husband   Right handed   Caffeine: half -1 cup a coffee a day   Pt currently lives at [facility]   Financial Resource Strain: Low Risk  (04/30/2021)   Overall Financial Resource Strain (CARDIA)    Difficulty of Paying Living Expenses: Not hard at all  Food Insecurity: No Food Insecurity (05/25/2022)   Hunger Vital Sign    Worried About Running Out of Food in the Last Year: Never true    Ran Out of Food in the Last Year: Never true  Transportation Needs: Unmet Transportation Needs (07/17/2022)   PRAPARE - Transportation    Lack of Transportation (Medical): No    Lack of Transportation (Non-Medical): Yes  Physical Activity: Inactive (04/30/2021)   Exercise Vital Sign  Days of Exercise per Week: 0 days    Minutes of Exercise per Session: 0 min  Stress: Stress Concern Present (04/30/2021)   [employer] of Occupational Health - Occupational Stress Questionnaire    Feeling of Stress : Rather much  Social Connections: Moderately Integrated (04/30/2021)   Social Connection and Isolation Panel [NHANES]    Frequency of  Communication with Friends and Family: More than three times a week    Frequency of Social Gatherings with Friends and Family: Three times a week    Attends Religious Services: 1 to 4 times per year    Active Member of Clubs or Organizations: No    Attends Banker Meetings: Never    Marital Status: Married  Catering manager Violence: Not At Risk (05/25/2022)   Humiliation, Afraid, Rape, and Kick questionnaire    Fear of Current or Ex-Partner: No    Emotionally Abused: No    Physically Abused: No    Sexually Abused: No      PHYSICAL EXAM  Vitals:   05/24/23 1354  BP: 119/66  Pulse: 73  Weight: 95 lb (43.1 kg)  Height: 5\' 6"  (1.676 m)    Body mass index is 15.33 kg/m.  Generalized: Well developed, in no acute distress   Neurological examination  Mentation: Alert oriented to person and place.  Speech is limited.  Able to answer questions by raising her hand to the correct answer Cranial nerve II-XII: Extraocular movements were full, visual field were full on confrontational test. Facial sensation and strength were normal. Uvula tongue midline. Motor: The motor testing reveals 5 over 5 strength of all 4 extremities.  Increased tone in the left upper extremity.  Sensory: Sensory testing is intact to soft touch on all 4 extremities. No evidence of extinction is noted.  Coordination: Cerebellar testing reveals good finger-nose-finger and heel-to-shin bilaterally.  Noticed prominent tremor in the left upper extremity when doing heel-to-shin. Gait and station: In a wheelchair today   DIAGNOSTIC DATA (LABS, IMAGING, TESTING) - I reviewed patient records, labs, notes, testing and imaging myself where available.  Lab Results  Component Value Date   WBC 8.2 11/28/2022   HGB 12.4 11/28/2022   HCT 39.2 11/28/2022   MCV 89.5 11/28/2022   PLT 287 11/28/2022      Component Value Date/Time   NA 141 11/28/2022 1246   NA 138 08/26/2021 0000   K 3.5 11/28/2022 1246   CL  104 11/28/2022 1246   CO2 28 11/28/2022 1246   GLUCOSE 107 (H) 11/28/2022 1246   BUN 28 (H) 11/28/2022 1246   BUN 19 08/26/2021 0000   CREATININE 0.91 11/28/2022 1246   CREATININE 0.85 06/07/2020 1030   CALCIUM 9.2 11/28/2022 1246   PROT 6.3 (L) 11/28/2022 1246   PROT 6.3 06/09/2021 1450   ALBUMIN 3.6 11/28/2022 1246   ALBUMIN 4.2 06/09/2021 1450   AST 58 (H) 11/28/2022 1246   ALT 29 11/28/2022 1246   ALKPHOS 54 11/28/2022 1246   BILITOT 0.8 11/28/2022 1246   BILITOT 0.3 06/09/2021 1450   GFRNONAA >60 11/28/2022 1246   GFRAA 93 12/04/2019 1540   Lab Results  Component Value Date   CHOL 200 05/26/2021   HDL 108.70 05/26/2021   LDLCALC 76 05/26/2021   LDLDIRECT 106.9 11/03/2012   TRIG 75.0 05/26/2021   CHOLHDL 2 05/26/2021   Lab Results  Component Value Date   HGBA1C 6.6 (H) 01/28/2022   Lab Results  Component Value Date   VITAMINB12 290 06/09/2021  Lab Results  Component Value Date   TSH 1.397 03/19/2022      ASSESSMENT AND PLAN 78 y.o. year old female  has a past medical history of Anxiety state, unspecified, Hyperlipidemia, Hypertension, Insomnia, LBBB (left bundle branch block), Osteoarthrosis, unspecified whether generalized or localized, unspecified site, Overweight(278.02), Parkinson disease (HCC), S/P placement of cardiac pacemaker, Serrated adenoma of colon (2007), Symptomatic menopausal or female climacteric states, Syncope and collapse, and Ventricular tachycardia (HCC) (05/24/2022). here with:  1.  Parkinson's disease  -According to the husband the facility physician increased Sinemet to 1 full tablet 4 times a day and this has been beneficial per the husband. -Advised husband to talk to the facility about her diet.  As she may do well with softer foods. -Continue to monitor symptoms -Follow-up in 3 to 4 months with Dr. Vergia Alcon, MSN, NP-C 05/21/2023, 4:09 PM [facility] [address] [place], [place]  [address] [phone]

## 2023-05-24 ENCOUNTER — Encounter: Payer: Self-pay | Admitting: Adult Health

## 2023-05-24 ENCOUNTER — Ambulatory Visit: Payer: PPO | Admitting: Adult Health

## 2023-05-24 VITALS — BP 119/66 | HR 73 | Ht 66.0 in | Wt 95.0 lb

## 2023-05-24 DIAGNOSIS — G20C Parkinsonism, unspecified: Secondary | ICD-10-CM

## 2023-06-08 DIAGNOSIS — F331 Major depressive disorder, recurrent, moderate: Secondary | ICD-10-CM | POA: Diagnosis not present

## 2023-06-08 DIAGNOSIS — F411 Generalized anxiety disorder: Secondary | ICD-10-CM | POA: Diagnosis not present

## 2023-07-06 DIAGNOSIS — F331 Major depressive disorder, recurrent, moderate: Secondary | ICD-10-CM | POA: Diagnosis not present

## 2023-07-06 DIAGNOSIS — F411 Generalized anxiety disorder: Secondary | ICD-10-CM | POA: Diagnosis not present

## 2023-07-27 DIAGNOSIS — F331 Major depressive disorder, recurrent, moderate: Secondary | ICD-10-CM | POA: Diagnosis not present

## 2023-07-27 DIAGNOSIS — F411 Generalized anxiety disorder: Secondary | ICD-10-CM | POA: Diagnosis not present

## 2023-08-06 DIAGNOSIS — G20A1 Parkinson's disease without dyskinesia, without mention of fluctuations: Secondary | ICD-10-CM | POA: Diagnosis not present

## 2023-08-06 DIAGNOSIS — F0284 Dementia in other diseases classified elsewhere, unspecified severity, with anxiety: Secondary | ICD-10-CM | POA: Diagnosis not present

## 2023-08-06 DIAGNOSIS — R251 Tremor, unspecified: Secondary | ICD-10-CM | POA: Diagnosis not present

## 2023-08-17 DIAGNOSIS — F331 Major depressive disorder, recurrent, moderate: Secondary | ICD-10-CM | POA: Diagnosis not present

## 2023-08-17 DIAGNOSIS — F411 Generalized anxiety disorder: Secondary | ICD-10-CM | POA: Diagnosis not present

## 2023-08-27 DIAGNOSIS — I442 Atrioventricular block, complete: Secondary | ICD-10-CM | POA: Diagnosis not present

## 2023-08-27 DIAGNOSIS — G20C Parkinsonism, unspecified: Secondary | ICD-10-CM | POA: Diagnosis not present

## 2023-08-27 DIAGNOSIS — I951 Orthostatic hypotension: Secondary | ICD-10-CM | POA: Diagnosis not present

## 2023-08-27 DIAGNOSIS — R5381 Other malaise: Secondary | ICD-10-CM | POA: Diagnosis not present

## 2023-09-07 DIAGNOSIS — F411 Generalized anxiety disorder: Secondary | ICD-10-CM | POA: Diagnosis not present

## 2023-09-07 DIAGNOSIS — F331 Major depressive disorder, recurrent, moderate: Secondary | ICD-10-CM | POA: Diagnosis not present

## 2023-09-22 ENCOUNTER — Telehealth: Payer: Self-pay | Admitting: Adult Health

## 2023-09-22 NOTE — Telephone Encounter (Signed)
 Request to cx appointment

## 2023-09-28 DIAGNOSIS — F411 Generalized anxiety disorder: Secondary | ICD-10-CM | POA: Diagnosis not present

## 2023-09-28 DIAGNOSIS — F331 Major depressive disorder, recurrent, moderate: Secondary | ICD-10-CM | POA: Diagnosis not present

## 2023-10-06 ENCOUNTER — Ambulatory Visit: Admitting: Neurology

## 2023-10-21 ENCOUNTER — Ambulatory Visit: Payer: PPO | Admitting: Adult Health

## 2023-10-26 DIAGNOSIS — F331 Major depressive disorder, recurrent, moderate: Secondary | ICD-10-CM | POA: Diagnosis not present

## 2023-10-26 DIAGNOSIS — F411 Generalized anxiety disorder: Secondary | ICD-10-CM | POA: Diagnosis not present

## 2023-11-09 DIAGNOSIS — F02A4 Dementia in other diseases classified elsewhere, mild, with anxiety: Secondary | ICD-10-CM | POA: Diagnosis not present

## 2023-11-09 DIAGNOSIS — F02A18 Dementia in other diseases classified elsewhere, mild, with other behavioral disturbance: Secondary | ICD-10-CM | POA: Diagnosis not present

## 2023-11-09 DIAGNOSIS — F02A3 Dementia in other diseases classified elsewhere, mild, with mood disturbance: Secondary | ICD-10-CM | POA: Diagnosis not present

## 2023-11-09 DIAGNOSIS — G20A1 Parkinson's disease without dyskinesia, without mention of fluctuations: Secondary | ICD-10-CM | POA: Diagnosis not present

## 2023-11-10 DIAGNOSIS — R8290 Unspecified abnormal findings in urine: Secondary | ICD-10-CM | POA: Diagnosis not present

## 2023-11-12 DIAGNOSIS — Z9189 Other specified personal risk factors, not elsewhere classified: Secondary | ICD-10-CM | POA: Diagnosis not present

## 2023-11-12 DIAGNOSIS — E559 Vitamin D deficiency, unspecified: Secondary | ICD-10-CM | POA: Diagnosis not present

## 2023-11-12 DIAGNOSIS — R82998 Other abnormal findings in urine: Secondary | ICD-10-CM | POA: Diagnosis not present

## 2023-11-12 DIAGNOSIS — E785 Hyperlipidemia, unspecified: Secondary | ICD-10-CM | POA: Diagnosis not present

## 2023-11-16 DIAGNOSIS — F331 Major depressive disorder, recurrent, moderate: Secondary | ICD-10-CM | POA: Diagnosis not present

## 2023-11-16 DIAGNOSIS — F411 Generalized anxiety disorder: Secondary | ICD-10-CM | POA: Diagnosis not present

## 2023-12-07 DIAGNOSIS — F331 Major depressive disorder, recurrent, moderate: Secondary | ICD-10-CM | POA: Diagnosis not present

## 2023-12-07 DIAGNOSIS — F411 Generalized anxiety disorder: Secondary | ICD-10-CM | POA: Diagnosis not present

## 2023-12-29 DIAGNOSIS — R5381 Other malaise: Secondary | ICD-10-CM | POA: Diagnosis not present

## 2023-12-29 DIAGNOSIS — I951 Orthostatic hypotension: Secondary | ICD-10-CM | POA: Diagnosis not present

## 2023-12-29 DIAGNOSIS — G20C Parkinsonism, unspecified: Secondary | ICD-10-CM | POA: Diagnosis not present

## 2023-12-29 DIAGNOSIS — G909 Disorder of the autonomic nervous system, unspecified: Secondary | ICD-10-CM | POA: Diagnosis not present

## 2024-01-18 DIAGNOSIS — F331 Major depressive disorder, recurrent, moderate: Secondary | ICD-10-CM | POA: Diagnosis not present

## 2024-01-18 DIAGNOSIS — F411 Generalized anxiety disorder: Secondary | ICD-10-CM | POA: Diagnosis not present

## 2024-02-08 DIAGNOSIS — F331 Major depressive disorder, recurrent, moderate: Secondary | ICD-10-CM | POA: Diagnosis not present

## 2024-02-08 DIAGNOSIS — F411 Generalized anxiety disorder: Secondary | ICD-10-CM | POA: Diagnosis not present
